# Patient Record
Sex: Female | Born: 1955 | Race: White | Hispanic: No | Marital: Married | Smoking: Current every day smoker
Health system: Southern US, Community
[De-identification: ages and names within clinical notes are randomized; demographics above are authoritative.]

## PROBLEM LIST (undated history)

## (undated) ENCOUNTER — Emergency Department (HOSPITAL_COMMUNITY): Admission: EM | Payer: Medicaid Other | Source: Home / Self Care

## (undated) ENCOUNTER — Ambulatory Visit: Admission: EM | Source: Home / Self Care

## (undated) DIAGNOSIS — J45909 Unspecified asthma, uncomplicated: Secondary | ICD-10-CM

## (undated) DIAGNOSIS — F329 Major depressive disorder, single episode, unspecified: Secondary | ICD-10-CM

## (undated) DIAGNOSIS — E785 Hyperlipidemia, unspecified: Secondary | ICD-10-CM

## (undated) DIAGNOSIS — I82401 Acute embolism and thrombosis of unspecified deep veins of right lower extremity: Secondary | ICD-10-CM

## (undated) DIAGNOSIS — J439 Emphysema, unspecified: Secondary | ICD-10-CM

## (undated) DIAGNOSIS — Z9289 Personal history of other medical treatment: Secondary | ICD-10-CM

## (undated) DIAGNOSIS — F419 Anxiety disorder, unspecified: Secondary | ICD-10-CM

## (undated) DIAGNOSIS — K5790 Diverticulosis of intestine, part unspecified, without perforation or abscess without bleeding: Secondary | ICD-10-CM

## (undated) DIAGNOSIS — E119 Type 2 diabetes mellitus without complications: Secondary | ICD-10-CM

## (undated) DIAGNOSIS — J42 Unspecified chronic bronchitis: Secondary | ICD-10-CM

## (undated) DIAGNOSIS — N189 Chronic kidney disease, unspecified: Secondary | ICD-10-CM

## (undated) DIAGNOSIS — Z9989 Dependence on other enabling machines and devices: Secondary | ICD-10-CM

## (undated) DIAGNOSIS — R42 Dizziness and giddiness: Secondary | ICD-10-CM

## (undated) DIAGNOSIS — F32A Depression, unspecified: Secondary | ICD-10-CM

## (undated) DIAGNOSIS — R0902 Hypoxemia: Secondary | ICD-10-CM

## (undated) DIAGNOSIS — J069 Acute upper respiratory infection, unspecified: Secondary | ICD-10-CM

## (undated) DIAGNOSIS — K219 Gastro-esophageal reflux disease without esophagitis: Secondary | ICD-10-CM

## (undated) DIAGNOSIS — G4730 Sleep apnea, unspecified: Secondary | ICD-10-CM

## (undated) DIAGNOSIS — J189 Pneumonia, unspecified organism: Secondary | ICD-10-CM

## (undated) DIAGNOSIS — Z87442 Personal history of urinary calculi: Secondary | ICD-10-CM

## (undated) DIAGNOSIS — M1990 Unspecified osteoarthritis, unspecified site: Secondary | ICD-10-CM

## (undated) DIAGNOSIS — M542 Cervicalgia: Secondary | ICD-10-CM

## (undated) DIAGNOSIS — K589 Irritable bowel syndrome without diarrhea: Secondary | ICD-10-CM

## (undated) DIAGNOSIS — R0609 Other forms of dyspnea: Secondary | ICD-10-CM

## (undated) DIAGNOSIS — I509 Heart failure, unspecified: Secondary | ICD-10-CM

## (undated) DIAGNOSIS — N390 Urinary tract infection, site not specified: Secondary | ICD-10-CM

## (undated) DIAGNOSIS — G40909 Epilepsy, unspecified, not intractable, without status epilepticus: Secondary | ICD-10-CM

## (undated) DIAGNOSIS — I10 Essential (primary) hypertension: Secondary | ICD-10-CM

## (undated) DIAGNOSIS — J449 Chronic obstructive pulmonary disease, unspecified: Secondary | ICD-10-CM

## (undated) DIAGNOSIS — K222 Esophageal obstruction: Secondary | ICD-10-CM

## (undated) DIAGNOSIS — Z86718 Personal history of other venous thrombosis and embolism: Secondary | ICD-10-CM

## (undated) DIAGNOSIS — G4733 Obstructive sleep apnea (adult) (pediatric): Secondary | ICD-10-CM

## (undated) DIAGNOSIS — U071 COVID-19: Secondary | ICD-10-CM

## (undated) DIAGNOSIS — Z923 Personal history of irradiation: Secondary | ICD-10-CM

## (undated) DIAGNOSIS — C801 Malignant (primary) neoplasm, unspecified: Secondary | ICD-10-CM

## (undated) DIAGNOSIS — R0600 Dyspnea, unspecified: Secondary | ICD-10-CM

## (undated) DIAGNOSIS — R569 Unspecified convulsions: Secondary | ICD-10-CM

## (undated) HISTORY — PX: CARPAL TUNNEL RELEASE: SHX101

## (undated) HISTORY — DX: Personal history of other venous thrombosis and embolism: Z86.718

## (undated) HISTORY — PX: CATARACT EXTRACTION: SUR2

## (undated) HISTORY — DX: Dependence on other enabling machines and devices: Z99.89

## (undated) HISTORY — DX: Heart failure, unspecified: I50.9

## (undated) HISTORY — PX: KNEE ARTHROSCOPY: SUR90

## (undated) HISTORY — DX: Irritable bowel syndrome, unspecified: K58.9

## (undated) HISTORY — DX: Unspecified osteoarthritis, unspecified site: M19.90

## (undated) HISTORY — DX: Sleep apnea, unspecified: G47.30

## (undated) HISTORY — DX: Obstructive sleep apnea (adult) (pediatric): G47.33

## (undated) HISTORY — PX: SHOULDER OPEN ROTATOR CUFF REPAIR: SHX2407

## (undated) HISTORY — DX: Essential (primary) hypertension: I10

## (undated) HISTORY — DX: Diverticulosis of intestine, part unspecified, without perforation or abscess without bleeding: K57.90

## (undated) HISTORY — DX: Dizziness and giddiness: R42

## (undated) HISTORY — PX: COLONOSCOPY WITH ESOPHAGOGASTRODUODENOSCOPY (EGD): SHX5779

## (undated) HISTORY — PX: SINOSCOPY: SHX187

## (undated) HISTORY — PX: UPPER GASTROINTESTINAL ENDOSCOPY: SHX188

## (undated) HISTORY — DX: Personal history of other medical treatment: Z92.89

## (undated) HISTORY — DX: Acute upper respiratory infection, unspecified: J06.9

## (undated) HISTORY — DX: Major depressive disorder, single episode, unspecified: F32.9

## (undated) HISTORY — PX: COLONOSCOPY: SHX174

## (undated) HISTORY — DX: Hyperlipidemia, unspecified: E78.5

## (undated) HISTORY — DX: Hypoxemia: R09.02

## (undated) HISTORY — DX: Unspecified asthma, uncomplicated: J45.909

## (undated) HISTORY — DX: Cervicalgia: M54.2

## (undated) HISTORY — DX: COVID-19: U07.1

## (undated) HISTORY — DX: Depression, unspecified: F32.A

## (undated) HISTORY — DX: Esophageal obstruction: K22.2

## (undated) HISTORY — DX: Unspecified convulsions: R56.9

## (undated) HISTORY — DX: Gastro-esophageal reflux disease without esophagitis: K21.9

## (undated) HISTORY — DX: Chronic obstructive pulmonary disease, unspecified: J44.9

---

## 1980-08-11 DIAGNOSIS — I82401 Acute embolism and thrombosis of unspecified deep veins of right lower extremity: Secondary | ICD-10-CM

## 1980-08-11 HISTORY — DX: Acute embolism and thrombosis of unspecified deep veins of right lower extremity: I82.401

## 1997-12-18 ENCOUNTER — Encounter: Admission: RE | Admit: 1997-12-18 | Discharge: 1997-12-18 | Payer: Self-pay | Admitting: Family Medicine

## 1997-12-22 ENCOUNTER — Encounter: Admission: RE | Admit: 1997-12-22 | Discharge: 1997-12-22 | Payer: Self-pay | Admitting: Family Medicine

## 1998-01-01 ENCOUNTER — Encounter: Admission: RE | Admit: 1998-01-01 | Discharge: 1998-01-01 | Payer: Self-pay | Admitting: Family Medicine

## 1998-01-15 ENCOUNTER — Encounter: Admission: RE | Admit: 1998-01-15 | Discharge: 1998-01-15 | Payer: Self-pay | Admitting: Family Medicine

## 1998-02-01 ENCOUNTER — Encounter: Admission: RE | Admit: 1998-02-01 | Discharge: 1998-02-01 | Payer: Self-pay | Admitting: Family Medicine

## 1998-02-26 ENCOUNTER — Encounter: Admission: RE | Admit: 1998-02-26 | Discharge: 1998-02-26 | Payer: Self-pay | Admitting: Family Medicine

## 1998-03-15 ENCOUNTER — Encounter: Admission: RE | Admit: 1998-03-15 | Discharge: 1998-03-15 | Payer: Self-pay | Admitting: Family Medicine

## 1998-04-19 ENCOUNTER — Encounter: Admission: RE | Admit: 1998-04-19 | Discharge: 1998-04-19 | Payer: Self-pay | Admitting: Family Medicine

## 1998-05-31 ENCOUNTER — Encounter: Admission: RE | Admit: 1998-05-31 | Discharge: 1998-05-31 | Payer: Self-pay | Admitting: Family Medicine

## 1998-06-19 ENCOUNTER — Other Ambulatory Visit: Admission: RE | Admit: 1998-06-19 | Discharge: 1998-06-19 | Payer: Self-pay | Admitting: *Deleted

## 1998-07-24 ENCOUNTER — Encounter: Admission: RE | Admit: 1998-07-24 | Discharge: 1998-07-24 | Payer: Self-pay | Admitting: Sports Medicine

## 1998-08-07 ENCOUNTER — Encounter: Admission: RE | Admit: 1998-08-07 | Discharge: 1998-08-07 | Payer: Self-pay | Admitting: Family Medicine

## 1998-09-10 ENCOUNTER — Encounter: Admission: RE | Admit: 1998-09-10 | Discharge: 1998-09-10 | Payer: Self-pay | Admitting: Sports Medicine

## 1998-09-18 ENCOUNTER — Encounter: Admission: RE | Admit: 1998-09-18 | Discharge: 1998-09-18 | Payer: Self-pay | Admitting: Sports Medicine

## 1998-10-11 ENCOUNTER — Encounter: Admission: RE | Admit: 1998-10-11 | Discharge: 1998-10-11 | Payer: Self-pay | Admitting: Family Medicine

## 1998-11-05 ENCOUNTER — Encounter: Admission: RE | Admit: 1998-11-05 | Discharge: 1998-11-05 | Payer: Self-pay | Admitting: Family Medicine

## 1998-11-14 ENCOUNTER — Encounter: Admission: RE | Admit: 1998-11-14 | Discharge: 1998-11-14 | Payer: Self-pay | Admitting: Family Medicine

## 1998-12-13 ENCOUNTER — Encounter: Admission: RE | Admit: 1998-12-13 | Discharge: 1998-12-13 | Payer: Self-pay | Admitting: Family Medicine

## 1999-01-21 ENCOUNTER — Encounter: Admission: RE | Admit: 1999-01-21 | Discharge: 1999-01-21 | Payer: Self-pay | Admitting: Family Medicine

## 1999-03-08 ENCOUNTER — Encounter: Admission: RE | Admit: 1999-03-08 | Discharge: 1999-03-08 | Payer: Self-pay | Admitting: Family Medicine

## 1999-04-11 ENCOUNTER — Encounter: Admission: RE | Admit: 1999-04-11 | Discharge: 1999-04-11 | Payer: Self-pay | Admitting: Family Medicine

## 1999-04-17 ENCOUNTER — Encounter: Admission: RE | Admit: 1999-04-17 | Discharge: 1999-04-17 | Payer: Self-pay | Admitting: Family Medicine

## 1999-04-24 ENCOUNTER — Encounter: Admission: RE | Admit: 1999-04-24 | Discharge: 1999-04-24 | Payer: Self-pay | Admitting: Family Medicine

## 1999-05-16 ENCOUNTER — Encounter: Admission: RE | Admit: 1999-05-16 | Discharge: 1999-05-16 | Payer: Self-pay | Admitting: Family Medicine

## 1999-05-29 ENCOUNTER — Encounter: Admission: RE | Admit: 1999-05-29 | Discharge: 1999-05-29 | Payer: Self-pay | Admitting: Family Medicine

## 1999-06-27 ENCOUNTER — Encounter: Admission: RE | Admit: 1999-06-27 | Discharge: 1999-06-27 | Payer: Self-pay | Admitting: Sports Medicine

## 1999-06-27 ENCOUNTER — Encounter: Payer: Self-pay | Admitting: Sports Medicine

## 1999-07-03 ENCOUNTER — Encounter: Admission: RE | Admit: 1999-07-03 | Discharge: 1999-07-03 | Payer: Self-pay | Admitting: Family Medicine

## 1999-08-12 HISTORY — PX: ABDOMINAL HYSTERECTOMY: SHX81

## 1999-08-12 HISTORY — PX: TUBAL LIGATION: SHX77

## 1999-08-19 ENCOUNTER — Encounter: Admission: RE | Admit: 1999-08-19 | Discharge: 1999-08-19 | Payer: Self-pay | Admitting: Family Medicine

## 1999-09-19 ENCOUNTER — Encounter: Admission: RE | Admit: 1999-09-19 | Discharge: 1999-09-19 | Payer: Self-pay | Admitting: Family Medicine

## 1999-10-18 ENCOUNTER — Encounter: Admission: RE | Admit: 1999-10-18 | Discharge: 1999-10-18 | Payer: Self-pay | Admitting: Family Medicine

## 1999-10-30 ENCOUNTER — Encounter: Admission: RE | Admit: 1999-10-30 | Discharge: 1999-10-30 | Payer: Self-pay | Admitting: Sports Medicine

## 1999-10-30 ENCOUNTER — Encounter: Payer: Self-pay | Admitting: Sports Medicine

## 1999-11-06 ENCOUNTER — Encounter: Admission: RE | Admit: 1999-11-06 | Discharge: 1999-11-06 | Payer: Self-pay | Admitting: Family Medicine

## 1999-12-10 ENCOUNTER — Encounter: Admission: RE | Admit: 1999-12-10 | Discharge: 1999-12-10 | Payer: Self-pay | Admitting: Family Medicine

## 1999-12-19 ENCOUNTER — Ambulatory Visit (HOSPITAL_COMMUNITY): Admission: RE | Admit: 1999-12-19 | Discharge: 1999-12-19 | Payer: Self-pay | Admitting: *Deleted

## 1999-12-25 ENCOUNTER — Encounter: Admission: RE | Admit: 1999-12-25 | Discharge: 1999-12-25 | Payer: Self-pay | Admitting: Family Medicine

## 2000-04-17 ENCOUNTER — Encounter: Admission: RE | Admit: 2000-04-17 | Discharge: 2000-04-17 | Payer: Self-pay | Admitting: Family Medicine

## 2000-04-22 ENCOUNTER — Encounter: Admission: RE | Admit: 2000-04-22 | Discharge: 2000-04-22 | Payer: Self-pay | Admitting: Family Medicine

## 2000-06-05 ENCOUNTER — Encounter: Admission: RE | Admit: 2000-06-05 | Discharge: 2000-06-05 | Payer: Self-pay | Admitting: Obstetrics & Gynecology

## 2000-06-09 ENCOUNTER — Encounter: Admission: RE | Admit: 2000-06-09 | Discharge: 2000-06-09 | Payer: Self-pay | Admitting: Family Medicine

## 2000-06-12 ENCOUNTER — Encounter: Admission: RE | Admit: 2000-06-12 | Discharge: 2000-06-12 | Payer: Self-pay | Admitting: Obstetrics & Gynecology

## 2000-06-17 ENCOUNTER — Ambulatory Visit (HOSPITAL_COMMUNITY): Admission: RE | Admit: 2000-06-17 | Discharge: 2000-06-17 | Payer: Self-pay | Admitting: *Deleted

## 2000-07-14 ENCOUNTER — Encounter: Admission: RE | Admit: 2000-07-14 | Discharge: 2000-07-14 | Payer: Self-pay | Admitting: Family Medicine

## 2000-07-21 ENCOUNTER — Encounter (INDEPENDENT_AMBULATORY_CARE_PROVIDER_SITE_OTHER): Payer: Self-pay | Admitting: Specialist

## 2000-07-21 ENCOUNTER — Inpatient Hospital Stay (HOSPITAL_COMMUNITY): Admission: RE | Admit: 2000-07-21 | Discharge: 2000-07-23 | Payer: Self-pay | Admitting: Obstetrics & Gynecology

## 2000-07-25 ENCOUNTER — Inpatient Hospital Stay (HOSPITAL_COMMUNITY): Admission: AD | Admit: 2000-07-25 | Discharge: 2000-07-25 | Payer: Self-pay | Admitting: *Deleted

## 2000-07-27 ENCOUNTER — Inpatient Hospital Stay (HOSPITAL_COMMUNITY): Admission: EM | Admit: 2000-07-27 | Discharge: 2000-07-30 | Payer: Self-pay

## 2000-07-28 ENCOUNTER — Encounter: Payer: Self-pay | Admitting: Family Medicine

## 2000-08-24 ENCOUNTER — Encounter: Admission: RE | Admit: 2000-08-24 | Discharge: 2000-08-24 | Payer: Self-pay | Admitting: Family Medicine

## 2000-09-14 ENCOUNTER — Encounter: Admission: RE | Admit: 2000-09-14 | Discharge: 2000-09-14 | Payer: Self-pay | Admitting: Sports Medicine

## 2000-09-14 ENCOUNTER — Encounter: Payer: Self-pay | Admitting: Sports Medicine

## 2000-09-14 ENCOUNTER — Encounter: Admission: RE | Admit: 2000-09-14 | Discharge: 2000-09-14 | Payer: Self-pay | Admitting: Family Medicine

## 2000-09-24 ENCOUNTER — Encounter: Admission: RE | Admit: 2000-09-24 | Discharge: 2000-09-24 | Payer: Self-pay | Admitting: Family Medicine

## 2000-09-29 ENCOUNTER — Encounter: Admission: RE | Admit: 2000-09-29 | Discharge: 2000-09-29 | Payer: Self-pay | Admitting: *Deleted

## 2000-10-17 ENCOUNTER — Emergency Department (HOSPITAL_COMMUNITY): Admission: EM | Admit: 2000-10-17 | Discharge: 2000-10-17 | Payer: Self-pay | Admitting: Emergency Medicine

## 2000-10-23 ENCOUNTER — Encounter: Admission: RE | Admit: 2000-10-23 | Discharge: 2000-10-23 | Payer: Self-pay | Admitting: Obstetrics & Gynecology

## 2000-11-02 ENCOUNTER — Encounter: Admission: RE | Admit: 2000-11-02 | Discharge: 2000-11-02 | Payer: Self-pay | Admitting: Family Medicine

## 2000-11-11 ENCOUNTER — Ambulatory Visit (HOSPITAL_COMMUNITY): Admission: RE | Admit: 2000-11-11 | Discharge: 2000-11-11 | Payer: Self-pay | Admitting: *Deleted

## 2000-11-27 ENCOUNTER — Encounter: Admission: RE | Admit: 2000-11-27 | Discharge: 2000-11-27 | Payer: Self-pay | Admitting: Family Medicine

## 2001-01-01 ENCOUNTER — Ambulatory Visit (HOSPITAL_COMMUNITY): Admission: RE | Admit: 2001-01-01 | Discharge: 2001-01-01 | Payer: Self-pay | Admitting: Family Medicine

## 2001-01-01 ENCOUNTER — Encounter: Admission: RE | Admit: 2001-01-01 | Discharge: 2001-01-01 | Payer: Self-pay | Admitting: Family Medicine

## 2001-02-16 ENCOUNTER — Encounter: Admission: RE | Admit: 2001-02-16 | Discharge: 2001-02-16 | Payer: Self-pay | Admitting: Family Medicine

## 2001-03-05 ENCOUNTER — Encounter: Admission: RE | Admit: 2001-03-05 | Discharge: 2001-03-05 | Payer: Self-pay | Admitting: Family Medicine

## 2001-04-07 ENCOUNTER — Encounter: Admission: RE | Admit: 2001-04-07 | Discharge: 2001-04-07 | Payer: Self-pay | Admitting: Family Medicine

## 2001-04-08 ENCOUNTER — Encounter: Admission: RE | Admit: 2001-04-08 | Discharge: 2001-04-08 | Payer: Self-pay | Admitting: Sports Medicine

## 2001-04-08 ENCOUNTER — Encounter: Payer: Self-pay | Admitting: Sports Medicine

## 2001-04-21 ENCOUNTER — Encounter: Admission: RE | Admit: 2001-04-21 | Discharge: 2001-04-21 | Payer: Self-pay | Admitting: Family Medicine

## 2001-05-17 ENCOUNTER — Encounter: Admission: RE | Admit: 2001-05-17 | Discharge: 2001-05-17 | Payer: Self-pay | Admitting: Family Medicine

## 2001-05-19 ENCOUNTER — Encounter: Admission: RE | Admit: 2001-05-19 | Discharge: 2001-05-19 | Payer: Self-pay | Admitting: Orthopedic Surgery

## 2001-05-19 ENCOUNTER — Encounter: Payer: Self-pay | Admitting: Sports Medicine

## 2001-06-22 ENCOUNTER — Encounter: Admission: RE | Admit: 2001-06-22 | Discharge: 2001-06-22 | Payer: Self-pay | Admitting: Family Medicine

## 2001-08-30 ENCOUNTER — Encounter: Admission: RE | Admit: 2001-08-30 | Discharge: 2001-08-30 | Payer: Self-pay | Admitting: Family Medicine

## 2001-10-22 ENCOUNTER — Encounter: Admission: RE | Admit: 2001-10-22 | Discharge: 2001-10-22 | Payer: Self-pay | Admitting: Family Medicine

## 2001-10-26 ENCOUNTER — Encounter: Admission: RE | Admit: 2001-10-26 | Discharge: 2001-10-26 | Payer: Self-pay | Admitting: *Deleted

## 2001-10-26 ENCOUNTER — Encounter: Payer: Self-pay | Admitting: Sports Medicine

## 2001-12-20 ENCOUNTER — Encounter: Payer: Self-pay | Admitting: Sports Medicine

## 2001-12-20 ENCOUNTER — Encounter: Admission: RE | Admit: 2001-12-20 | Discharge: 2001-12-20 | Payer: Self-pay | Admitting: Pediatrics

## 2001-12-20 ENCOUNTER — Encounter: Admission: RE | Admit: 2001-12-20 | Discharge: 2001-12-20 | Payer: Self-pay | Admitting: Sports Medicine

## 2002-01-07 ENCOUNTER — Encounter: Admission: RE | Admit: 2002-01-07 | Discharge: 2002-01-07 | Payer: Self-pay | Admitting: Family Medicine

## 2002-01-09 ENCOUNTER — Encounter (INDEPENDENT_AMBULATORY_CARE_PROVIDER_SITE_OTHER): Payer: Self-pay | Admitting: *Deleted

## 2002-01-09 LAB — CONVERTED CEMR LAB

## 2002-01-24 ENCOUNTER — Ambulatory Visit (HOSPITAL_COMMUNITY): Admission: RE | Admit: 2002-01-24 | Discharge: 2002-01-24 | Payer: Self-pay | Admitting: Family Medicine

## 2002-01-24 ENCOUNTER — Encounter: Admission: RE | Admit: 2002-01-24 | Discharge: 2002-01-24 | Payer: Self-pay | Admitting: Family Medicine

## 2002-01-25 ENCOUNTER — Encounter: Admission: RE | Admit: 2002-01-25 | Discharge: 2002-01-25 | Payer: Self-pay | Admitting: Family Medicine

## 2002-02-01 ENCOUNTER — Encounter: Admission: RE | Admit: 2002-02-01 | Discharge: 2002-02-01 | Payer: Self-pay | Admitting: Family Medicine

## 2002-02-17 ENCOUNTER — Encounter: Payer: Self-pay | Admitting: Orthopedic Surgery

## 2002-02-17 ENCOUNTER — Encounter: Admission: RE | Admit: 2002-02-17 | Discharge: 2002-02-17 | Payer: Self-pay | Admitting: Orthopedic Surgery

## 2002-02-22 ENCOUNTER — Encounter: Admission: RE | Admit: 2002-02-22 | Discharge: 2002-02-22 | Payer: Self-pay | Admitting: Family Medicine

## 2002-02-27 ENCOUNTER — Ambulatory Visit (HOSPITAL_BASED_OUTPATIENT_CLINIC_OR_DEPARTMENT_OTHER): Admission: RE | Admit: 2002-02-27 | Discharge: 2002-02-27 | Payer: Self-pay | Admitting: *Deleted

## 2002-03-23 ENCOUNTER — Encounter: Admission: RE | Admit: 2002-03-23 | Discharge: 2002-03-23 | Payer: Self-pay | Admitting: Family Medicine

## 2002-04-27 ENCOUNTER — Encounter: Payer: Self-pay | Admitting: Orthopedic Surgery

## 2002-04-28 ENCOUNTER — Ambulatory Visit (HOSPITAL_COMMUNITY): Admission: RE | Admit: 2002-04-28 | Discharge: 2002-04-29 | Payer: Self-pay | Admitting: Orthopedic Surgery

## 2002-05-05 ENCOUNTER — Encounter: Admission: RE | Admit: 2002-05-05 | Discharge: 2002-05-05 | Payer: Self-pay | Admitting: Family Medicine

## 2002-05-17 ENCOUNTER — Encounter: Admission: RE | Admit: 2002-05-17 | Discharge: 2002-06-01 | Payer: Self-pay | Admitting: Orthopedic Surgery

## 2002-05-23 ENCOUNTER — Encounter: Admission: RE | Admit: 2002-05-23 | Discharge: 2002-05-23 | Payer: Self-pay | Admitting: Family Medicine

## 2002-08-15 ENCOUNTER — Encounter: Admission: RE | Admit: 2002-08-15 | Discharge: 2002-08-15 | Payer: Self-pay | Admitting: Family Medicine

## 2002-10-14 ENCOUNTER — Encounter: Admission: RE | Admit: 2002-10-14 | Discharge: 2002-10-14 | Payer: Self-pay | Admitting: *Deleted

## 2002-10-14 ENCOUNTER — Encounter: Payer: Self-pay | Admitting: Sports Medicine

## 2002-10-26 ENCOUNTER — Encounter: Admission: RE | Admit: 2002-10-26 | Discharge: 2002-10-26 | Payer: Self-pay | Admitting: Family Medicine

## 2002-10-28 ENCOUNTER — Encounter: Admission: RE | Admit: 2002-10-28 | Discharge: 2002-10-28 | Payer: Self-pay | Admitting: Family Medicine

## 2002-11-03 ENCOUNTER — Encounter: Admission: RE | Admit: 2002-11-03 | Discharge: 2002-11-03 | Payer: Self-pay | Admitting: Family Medicine

## 2002-12-06 ENCOUNTER — Encounter: Admission: RE | Admit: 2002-12-06 | Discharge: 2002-12-06 | Payer: Self-pay | Admitting: Family Medicine

## 2002-12-14 ENCOUNTER — Ambulatory Visit (HOSPITAL_COMMUNITY): Admission: RE | Admit: 2002-12-14 | Discharge: 2002-12-14 | Payer: Self-pay

## 2002-12-14 ENCOUNTER — Encounter: Admission: RE | Admit: 2002-12-14 | Discharge: 2002-12-14 | Payer: Self-pay | Admitting: Family Medicine

## 2002-12-29 ENCOUNTER — Encounter: Admission: RE | Admit: 2002-12-29 | Discharge: 2002-12-29 | Payer: Self-pay | Admitting: Sports Medicine

## 2003-01-13 ENCOUNTER — Encounter: Admission: RE | Admit: 2003-01-13 | Discharge: 2003-01-13 | Payer: Self-pay | Admitting: Sports Medicine

## 2003-02-02 ENCOUNTER — Encounter: Admission: RE | Admit: 2003-02-02 | Discharge: 2003-02-02 | Payer: Self-pay | Admitting: Family Medicine

## 2003-02-21 ENCOUNTER — Encounter: Admission: RE | Admit: 2003-02-21 | Discharge: 2003-02-21 | Payer: Self-pay | Admitting: Family Medicine

## 2003-03-24 ENCOUNTER — Encounter: Admission: RE | Admit: 2003-03-24 | Discharge: 2003-03-24 | Payer: Self-pay | Admitting: Family Medicine

## 2003-04-11 ENCOUNTER — Ambulatory Visit (HOSPITAL_COMMUNITY): Admission: RE | Admit: 2003-04-11 | Discharge: 2003-04-11 | Payer: Self-pay | Admitting: Internal Medicine

## 2003-04-11 ENCOUNTER — Encounter: Payer: Self-pay | Admitting: Internal Medicine

## 2003-04-13 ENCOUNTER — Ambulatory Visit (HOSPITAL_COMMUNITY): Admission: RE | Admit: 2003-04-13 | Discharge: 2003-04-13 | Payer: Self-pay | Admitting: Internal Medicine

## 2003-04-18 ENCOUNTER — Ambulatory Visit (HOSPITAL_COMMUNITY): Admission: RE | Admit: 2003-04-18 | Discharge: 2003-04-18 | Payer: Self-pay | Admitting: Internal Medicine

## 2003-04-18 ENCOUNTER — Encounter: Payer: Self-pay | Admitting: Internal Medicine

## 2003-06-07 ENCOUNTER — Encounter: Admission: RE | Admit: 2003-06-07 | Discharge: 2003-06-07 | Payer: Self-pay | Admitting: Family Medicine

## 2003-07-10 ENCOUNTER — Encounter: Admission: RE | Admit: 2003-07-10 | Discharge: 2003-07-10 | Payer: Self-pay | Admitting: Family Medicine

## 2003-07-21 ENCOUNTER — Encounter: Admission: RE | Admit: 2003-07-21 | Discharge: 2003-07-21 | Payer: Self-pay | Admitting: Family Medicine

## 2003-08-12 DIAGNOSIS — C801 Malignant (primary) neoplasm, unspecified: Secondary | ICD-10-CM

## 2003-08-12 HISTORY — DX: Malignant (primary) neoplasm, unspecified: C80.1

## 2003-09-15 ENCOUNTER — Encounter: Admission: RE | Admit: 2003-09-15 | Discharge: 2003-09-15 | Payer: Self-pay | Admitting: Family Medicine

## 2003-11-02 ENCOUNTER — Encounter: Admission: RE | Admit: 2003-11-02 | Discharge: 2003-11-02 | Payer: Self-pay | Admitting: Sports Medicine

## 2003-11-22 ENCOUNTER — Encounter: Admission: RE | Admit: 2003-11-22 | Discharge: 2003-11-22 | Payer: Self-pay | Admitting: Family Medicine

## 2003-12-28 ENCOUNTER — Encounter: Admission: RE | Admit: 2003-12-28 | Discharge: 2003-12-28 | Payer: Self-pay | Admitting: Sports Medicine

## 2004-01-23 ENCOUNTER — Encounter: Admission: RE | Admit: 2004-01-23 | Discharge: 2004-01-23 | Payer: Self-pay | Admitting: Family Medicine

## 2004-01-29 ENCOUNTER — Encounter: Admission: RE | Admit: 2004-01-29 | Discharge: 2004-01-29 | Payer: Self-pay | Admitting: Family Medicine

## 2004-02-04 ENCOUNTER — Emergency Department (HOSPITAL_COMMUNITY): Admission: EM | Admit: 2004-02-04 | Discharge: 2004-02-04 | Payer: Self-pay | Admitting: *Deleted

## 2004-02-05 ENCOUNTER — Encounter: Admission: RE | Admit: 2004-02-05 | Discharge: 2004-02-05 | Payer: Self-pay | Admitting: Family Medicine

## 2004-02-09 ENCOUNTER — Encounter: Admission: RE | Admit: 2004-02-09 | Discharge: 2004-02-09 | Payer: Self-pay | Admitting: Family Medicine

## 2004-02-29 ENCOUNTER — Ambulatory Visit (HOSPITAL_BASED_OUTPATIENT_CLINIC_OR_DEPARTMENT_OTHER): Admission: RE | Admit: 2004-02-29 | Discharge: 2004-02-29 | Payer: Self-pay | Admitting: Sports Medicine

## 2004-03-23 ENCOUNTER — Emergency Department (HOSPITAL_COMMUNITY): Admission: EM | Admit: 2004-03-23 | Discharge: 2004-03-24 | Payer: Self-pay

## 2004-04-01 ENCOUNTER — Encounter: Admission: RE | Admit: 2004-04-01 | Discharge: 2004-04-01 | Payer: Self-pay | Admitting: Family Medicine

## 2004-04-29 ENCOUNTER — Ambulatory Visit: Payer: Self-pay | Admitting: Family Medicine

## 2004-05-17 ENCOUNTER — Ambulatory Visit: Payer: Self-pay | Admitting: Sports Medicine

## 2004-05-28 ENCOUNTER — Ambulatory Visit: Payer: Self-pay | Admitting: Sports Medicine

## 2004-06-27 ENCOUNTER — Ambulatory Visit: Payer: Self-pay | Admitting: Family Medicine

## 2004-08-07 ENCOUNTER — Ambulatory Visit: Payer: Self-pay | Admitting: Sports Medicine

## 2004-08-15 ENCOUNTER — Ambulatory Visit: Payer: Self-pay | Admitting: Family Medicine

## 2004-08-15 ENCOUNTER — Inpatient Hospital Stay (HOSPITAL_COMMUNITY): Admission: AD | Admit: 2004-08-15 | Discharge: 2004-08-16 | Payer: Self-pay | Admitting: Sports Medicine

## 2004-10-28 ENCOUNTER — Ambulatory Visit: Payer: Self-pay | Admitting: Family Medicine

## 2004-11-22 ENCOUNTER — Ambulatory Visit: Payer: Self-pay | Admitting: Family Medicine

## 2004-12-18 ENCOUNTER — Ambulatory Visit: Payer: Self-pay | Admitting: Family Medicine

## 2004-12-19 ENCOUNTER — Ambulatory Visit: Payer: Self-pay | Admitting: Family Medicine

## 2004-12-30 ENCOUNTER — Ambulatory Visit (HOSPITAL_COMMUNITY): Admission: RE | Admit: 2004-12-30 | Discharge: 2004-12-30 | Payer: Self-pay | Admitting: Family Medicine

## 2004-12-30 ENCOUNTER — Ambulatory Visit: Payer: Self-pay | Admitting: Family Medicine

## 2004-12-31 ENCOUNTER — Encounter: Admission: RE | Admit: 2004-12-31 | Discharge: 2005-03-31 | Payer: Self-pay | Admitting: Family Medicine

## 2005-01-17 ENCOUNTER — Ambulatory Visit: Payer: Self-pay | Admitting: Family Medicine

## 2005-02-08 ENCOUNTER — Emergency Department (HOSPITAL_COMMUNITY): Admission: EM | Admit: 2005-02-08 | Discharge: 2005-02-08 | Payer: Self-pay | Admitting: Emergency Medicine

## 2005-02-14 ENCOUNTER — Ambulatory Visit: Payer: Self-pay | Admitting: Family Medicine

## 2005-03-10 ENCOUNTER — Ambulatory Visit: Payer: Self-pay | Admitting: Family Medicine

## 2005-03-19 ENCOUNTER — Encounter: Admission: RE | Admit: 2005-03-19 | Discharge: 2005-03-19 | Payer: Self-pay | Admitting: Sports Medicine

## 2005-03-31 ENCOUNTER — Ambulatory Visit: Payer: Self-pay | Admitting: Family Medicine

## 2005-04-05 ENCOUNTER — Emergency Department (HOSPITAL_COMMUNITY): Admission: EM | Admit: 2005-04-05 | Discharge: 2005-04-05 | Payer: Self-pay | Admitting: Emergency Medicine

## 2005-04-07 ENCOUNTER — Ambulatory Visit: Payer: Self-pay | Admitting: Family Medicine

## 2005-04-18 ENCOUNTER — Ambulatory Visit: Payer: Self-pay | Admitting: Family Medicine

## 2005-05-22 ENCOUNTER — Ambulatory Visit: Payer: Self-pay | Admitting: Family Medicine

## 2005-07-21 ENCOUNTER — Ambulatory Visit: Payer: Self-pay | Admitting: Family Medicine

## 2005-08-18 ENCOUNTER — Ambulatory Visit: Payer: Self-pay | Admitting: Family Medicine

## 2005-08-22 ENCOUNTER — Emergency Department (HOSPITAL_COMMUNITY): Admission: EM | Admit: 2005-08-22 | Discharge: 2005-08-22 | Payer: Self-pay | Admitting: Emergency Medicine

## 2005-09-29 ENCOUNTER — Ambulatory Visit: Payer: Self-pay | Admitting: Family Medicine

## 2005-10-17 ENCOUNTER — Ambulatory Visit: Payer: Self-pay | Admitting: Family Medicine

## 2005-10-31 ENCOUNTER — Ambulatory Visit: Payer: Self-pay | Admitting: Family Medicine

## 2005-11-09 ENCOUNTER — Inpatient Hospital Stay (HOSPITAL_COMMUNITY): Admission: AD | Admit: 2005-11-09 | Discharge: 2005-11-09 | Payer: Self-pay | Admitting: Family Medicine

## 2006-02-27 ENCOUNTER — Ambulatory Visit: Payer: Self-pay | Admitting: Family Medicine

## 2006-03-04 ENCOUNTER — Encounter: Admission: RE | Admit: 2006-03-04 | Discharge: 2006-03-04 | Payer: Self-pay | Admitting: Sports Medicine

## 2006-03-26 ENCOUNTER — Ambulatory Visit (HOSPITAL_COMMUNITY): Admission: RE | Admit: 2006-03-26 | Discharge: 2006-03-26 | Payer: Self-pay | Admitting: Family Medicine

## 2006-03-26 ENCOUNTER — Ambulatory Visit: Payer: Self-pay | Admitting: Family Medicine

## 2006-04-03 ENCOUNTER — Ambulatory Visit: Payer: Self-pay | Admitting: Family Medicine

## 2006-04-16 ENCOUNTER — Ambulatory Visit: Payer: Self-pay | Admitting: Family Medicine

## 2006-05-12 ENCOUNTER — Ambulatory Visit: Payer: Self-pay | Admitting: Sports Medicine

## 2006-05-20 ENCOUNTER — Ambulatory Visit: Payer: Self-pay | Admitting: Internal Medicine

## 2006-05-28 ENCOUNTER — Encounter (INDEPENDENT_AMBULATORY_CARE_PROVIDER_SITE_OTHER): Payer: Self-pay | Admitting: Specialist

## 2006-05-28 ENCOUNTER — Ambulatory Visit: Payer: Self-pay | Admitting: Internal Medicine

## 2006-06-16 ENCOUNTER — Ambulatory Visit: Payer: Self-pay | Admitting: Family Medicine

## 2006-08-12 ENCOUNTER — Encounter: Admission: RE | Admit: 2006-08-12 | Discharge: 2006-08-12 | Payer: Self-pay | Admitting: Sports Medicine

## 2006-08-12 ENCOUNTER — Ambulatory Visit: Payer: Self-pay | Admitting: Family Medicine

## 2006-08-20 ENCOUNTER — Emergency Department (HOSPITAL_COMMUNITY): Admission: EM | Admit: 2006-08-20 | Discharge: 2006-08-20 | Payer: Self-pay | Admitting: Emergency Medicine

## 2006-08-26 ENCOUNTER — Ambulatory Visit: Payer: Self-pay | Admitting: Sports Medicine

## 2006-10-05 ENCOUNTER — Ambulatory Visit: Payer: Self-pay | Admitting: Family Medicine

## 2006-10-08 DIAGNOSIS — E782 Mixed hyperlipidemia: Secondary | ICD-10-CM | POA: Insufficient documentation

## 2006-10-08 DIAGNOSIS — E669 Obesity, unspecified: Secondary | ICD-10-CM

## 2006-10-08 DIAGNOSIS — H409 Unspecified glaucoma: Secondary | ICD-10-CM | POA: Insufficient documentation

## 2006-10-08 DIAGNOSIS — E785 Hyperlipidemia, unspecified: Secondary | ICD-10-CM

## 2006-10-08 DIAGNOSIS — N393 Stress incontinence (female) (male): Secondary | ICD-10-CM

## 2006-10-08 DIAGNOSIS — R569 Unspecified convulsions: Secondary | ICD-10-CM | POA: Insufficient documentation

## 2006-10-08 DIAGNOSIS — G43019 Migraine without aura, intractable, without status migrainosus: Secondary | ICD-10-CM | POA: Insufficient documentation

## 2006-10-08 DIAGNOSIS — K219 Gastro-esophageal reflux disease without esophagitis: Secondary | ICD-10-CM | POA: Insufficient documentation

## 2006-10-08 DIAGNOSIS — K449 Diaphragmatic hernia without obstruction or gangrene: Secondary | ICD-10-CM | POA: Insufficient documentation

## 2006-10-08 DIAGNOSIS — M1710 Unilateral primary osteoarthritis, unspecified knee: Secondary | ICD-10-CM

## 2006-10-08 DIAGNOSIS — G4730 Sleep apnea, unspecified: Secondary | ICD-10-CM | POA: Insufficient documentation

## 2006-10-08 DIAGNOSIS — N951 Menopausal and female climacteric states: Secondary | ICD-10-CM

## 2006-10-08 DIAGNOSIS — F17210 Nicotine dependence, cigarettes, uncomplicated: Secondary | ICD-10-CM | POA: Insufficient documentation

## 2006-10-08 DIAGNOSIS — E119 Type 2 diabetes mellitus without complications: Secondary | ICD-10-CM | POA: Insufficient documentation

## 2006-10-09 ENCOUNTER — Encounter (INDEPENDENT_AMBULATORY_CARE_PROVIDER_SITE_OTHER): Payer: Self-pay | Admitting: *Deleted

## 2006-10-20 ENCOUNTER — Encounter: Payer: Self-pay | Admitting: Infectious Diseases

## 2006-10-25 ENCOUNTER — Emergency Department (HOSPITAL_COMMUNITY): Admission: EM | Admit: 2006-10-25 | Discharge: 2006-10-25 | Payer: Self-pay | Admitting: Family Medicine

## 2006-11-10 LAB — CONVERTED CEMR LAB
Pap Smear: NORMAL
Pap Smear: NORMAL
Pap Smear: NORMAL
Pap Smear: NORMAL
Pap Smear: NORMAL
Pap Smear: NORMAL
Pap Smear: NORMAL

## 2006-11-24 ENCOUNTER — Ambulatory Visit: Payer: Self-pay | Admitting: Infectious Diseases

## 2006-11-25 ENCOUNTER — Ambulatory Visit: Payer: Self-pay | Admitting: Family Medicine

## 2006-11-25 ENCOUNTER — Encounter (INDEPENDENT_AMBULATORY_CARE_PROVIDER_SITE_OTHER): Payer: Self-pay | Admitting: Family Medicine

## 2006-11-25 ENCOUNTER — Other Ambulatory Visit: Admission: RE | Admit: 2006-11-25 | Discharge: 2006-11-25 | Payer: Self-pay | Admitting: Family Medicine

## 2006-11-28 ENCOUNTER — Telehealth (INDEPENDENT_AMBULATORY_CARE_PROVIDER_SITE_OTHER): Payer: Self-pay | Admitting: Family Medicine

## 2006-11-28 ENCOUNTER — Encounter (INDEPENDENT_AMBULATORY_CARE_PROVIDER_SITE_OTHER): Payer: Self-pay | Admitting: Family Medicine

## 2006-12-01 ENCOUNTER — Encounter: Payer: Self-pay | Admitting: *Deleted

## 2006-12-08 ENCOUNTER — Encounter: Admission: RE | Admit: 2006-12-08 | Discharge: 2006-12-08 | Payer: Self-pay | Admitting: Sports Medicine

## 2006-12-10 ENCOUNTER — Encounter (INDEPENDENT_AMBULATORY_CARE_PROVIDER_SITE_OTHER): Payer: Self-pay | Admitting: Family Medicine

## 2006-12-10 ENCOUNTER — Ambulatory Visit: Payer: Self-pay | Admitting: Sports Medicine

## 2006-12-18 ENCOUNTER — Encounter: Admission: RE | Admit: 2006-12-18 | Discharge: 2006-12-18 | Payer: Self-pay | Admitting: Sports Medicine

## 2007-01-28 ENCOUNTER — Ambulatory Visit: Payer: Self-pay | Admitting: Sports Medicine

## 2007-02-09 ENCOUNTER — Telehealth (INDEPENDENT_AMBULATORY_CARE_PROVIDER_SITE_OTHER): Payer: Self-pay | Admitting: Family Medicine

## 2007-02-11 ENCOUNTER — Telehealth: Payer: Self-pay | Admitting: *Deleted

## 2007-03-02 ENCOUNTER — Encounter (INDEPENDENT_AMBULATORY_CARE_PROVIDER_SITE_OTHER): Payer: Self-pay | Admitting: Family Medicine

## 2007-03-11 ENCOUNTER — Telehealth: Payer: Self-pay | Admitting: Infectious Diseases

## 2007-03-26 ENCOUNTER — Telehealth: Payer: Self-pay | Admitting: Infectious Diseases

## 2007-04-19 ENCOUNTER — Ambulatory Visit: Payer: Self-pay | Admitting: Family Medicine

## 2007-04-19 ENCOUNTER — Telehealth: Payer: Self-pay | Admitting: *Deleted

## 2007-04-22 ENCOUNTER — Ambulatory Visit: Payer: Self-pay | Admitting: Family Medicine

## 2007-04-22 ENCOUNTER — Encounter (INDEPENDENT_AMBULATORY_CARE_PROVIDER_SITE_OTHER): Payer: Self-pay | Admitting: Family Medicine

## 2007-04-22 ENCOUNTER — Encounter: Admission: RE | Admit: 2007-04-22 | Discharge: 2007-04-22 | Payer: Self-pay | Admitting: Family Medicine

## 2007-04-22 LAB — CONVERTED CEMR LAB
CO2: 22 meq/L (ref 19–32)
Calcium: 9.6 mg/dL (ref 8.4–10.5)
Eosinophils Relative: 0 % (ref 0–5)
Glucose, Bld: 100 mg/dL — ABNORMAL HIGH (ref 70–99)
HCT: 37.2 % (ref 36.0–46.0)
Hemoglobin: 12.3 g/dL (ref 12.0–15.0)
Lymphocytes Relative: 18 % (ref 12–46)
Lymphs Abs: 1.1 10*3/uL (ref 0.7–3.3)
Platelets: 269 10*3/uL (ref 150–400)
RBC: 3.84 M/uL — ABNORMAL LOW (ref 3.87–5.11)
Sodium: 141 meq/L (ref 135–145)
WBC: 6 10*3/uL (ref 4.0–10.5)

## 2007-05-11 ENCOUNTER — Telehealth: Payer: Self-pay | Admitting: Infectious Diseases

## 2007-05-12 ENCOUNTER — Ambulatory Visit: Payer: Self-pay | Admitting: Family Medicine

## 2007-05-12 ENCOUNTER — Encounter (INDEPENDENT_AMBULATORY_CARE_PROVIDER_SITE_OTHER): Payer: Self-pay | Admitting: Family Medicine

## 2007-05-12 DIAGNOSIS — J449 Chronic obstructive pulmonary disease, unspecified: Secondary | ICD-10-CM | POA: Insufficient documentation

## 2007-05-13 LAB — CONVERTED CEMR LAB
HCT: 41 % (ref 36.0–46.0)
MCV: 101.7 fL — ABNORMAL HIGH (ref 78.0–100.0)
Platelets: 199 10*3/uL (ref 150–400)
RDW: 15.1 % — ABNORMAL HIGH (ref 11.5–14.0)

## 2007-05-14 ENCOUNTER — Encounter (INDEPENDENT_AMBULATORY_CARE_PROVIDER_SITE_OTHER): Payer: Self-pay | Admitting: Family Medicine

## 2007-05-26 ENCOUNTER — Ambulatory Visit: Payer: Self-pay | Admitting: Family Medicine

## 2007-05-27 ENCOUNTER — Encounter (INDEPENDENT_AMBULATORY_CARE_PROVIDER_SITE_OTHER): Payer: Self-pay | Admitting: *Deleted

## 2007-05-27 ENCOUNTER — Ambulatory Visit: Payer: Self-pay | Admitting: Family Medicine

## 2007-05-27 LAB — CONVERTED CEMR LAB
Nitrite: NEGATIVE
Urobilinogen, UA: 0.2
WBC Urine, dipstick: NEGATIVE

## 2007-06-04 ENCOUNTER — Encounter (INDEPENDENT_AMBULATORY_CARE_PROVIDER_SITE_OTHER): Payer: Self-pay | Admitting: *Deleted

## 2007-06-30 ENCOUNTER — Encounter (INDEPENDENT_AMBULATORY_CARE_PROVIDER_SITE_OTHER): Payer: Self-pay | Admitting: Family Medicine

## 2007-06-30 ENCOUNTER — Ambulatory Visit: Payer: Self-pay | Admitting: Family Medicine

## 2007-06-30 ENCOUNTER — Encounter (INDEPENDENT_AMBULATORY_CARE_PROVIDER_SITE_OTHER): Payer: Self-pay | Admitting: *Deleted

## 2007-06-30 ENCOUNTER — Telehealth (INDEPENDENT_AMBULATORY_CARE_PROVIDER_SITE_OTHER): Payer: Self-pay | Admitting: *Deleted

## 2007-06-30 LAB — CONVERTED CEMR LAB
AST: 9 units/L (ref 0–37)
Albumin: 3.6 g/dL (ref 3.5–5.2)
Alkaline Phosphatase: 100 units/L (ref 39–117)
Total Protein: 6.2 g/dL (ref 6.0–8.3)

## 2007-08-27 ENCOUNTER — Ambulatory Visit: Payer: Self-pay | Admitting: Family Medicine

## 2007-09-01 ENCOUNTER — Ambulatory Visit: Payer: Self-pay | Admitting: Infectious Diseases

## 2007-09-09 ENCOUNTER — Telehealth: Payer: Self-pay | Admitting: Infectious Diseases

## 2007-09-24 ENCOUNTER — Encounter: Payer: Self-pay | Admitting: *Deleted

## 2007-09-24 ENCOUNTER — Ambulatory Visit: Payer: Self-pay | Admitting: Family Medicine

## 2007-09-24 ENCOUNTER — Encounter: Admission: RE | Admit: 2007-09-24 | Discharge: 2007-09-24 | Payer: Self-pay | Admitting: Family Medicine

## 2007-09-24 DIAGNOSIS — J309 Allergic rhinitis, unspecified: Secondary | ICD-10-CM | POA: Insufficient documentation

## 2007-09-24 DIAGNOSIS — J449 Chronic obstructive pulmonary disease, unspecified: Secondary | ICD-10-CM | POA: Insufficient documentation

## 2007-10-21 ENCOUNTER — Ambulatory Visit: Payer: Self-pay | Admitting: Infectious Diseases

## 2007-10-23 ENCOUNTER — Telehealth: Payer: Self-pay | Admitting: Infectious Diseases

## 2007-11-01 ENCOUNTER — Telehealth: Payer: Self-pay | Admitting: Infectious Diseases

## 2007-11-05 ENCOUNTER — Ambulatory Visit: Payer: Self-pay | Admitting: Family Medicine

## 2007-11-17 ENCOUNTER — Encounter (INDEPENDENT_AMBULATORY_CARE_PROVIDER_SITE_OTHER): Payer: Self-pay | Admitting: Family Medicine

## 2007-11-17 ENCOUNTER — Ambulatory Visit: Payer: Self-pay | Admitting: Family Medicine

## 2007-11-17 DIAGNOSIS — K589 Irritable bowel syndrome without diarrhea: Secondary | ICD-10-CM

## 2007-11-17 LAB — CONVERTED CEMR LAB
Nitrite: NEGATIVE
Specific Gravity, Urine: 1.01
WBC Urine, dipstick: NEGATIVE

## 2007-11-18 LAB — CONVERTED CEMR LAB
ALT: <8 units/L (ref 0–35)
AST: 9 units/L (ref 0–37)
Alkaline Phosphatase: 116 units/L (ref 39–117)
Basophils Absolute: 0 10*3/uL (ref 0.0–0.1)
Basophils Relative: 0 % (ref 0–1)
Calcium: 9.4 mg/dL (ref 8.4–10.5)
Chloride: 108 meq/L (ref 96–112)
Creatinine, Ser: 0.84 mg/dL (ref 0.40–1.20)
Eosinophils Absolute: 0 10*3/uL (ref 0.0–0.7)
HDL: 46 mg/dL (ref >39)
Hemoglobin: 12.4 g/dL (ref 12.0–15.0)
LDL Cholesterol: 99 mg/dL (ref 0–99)
Lipase: 14 units/L (ref 0–75)
MCHC: 32.5 g/dL (ref 30.0–36.0)
MCV: 101.1 fL — ABNORMAL HIGH (ref 78.0–100.0)
Monocytes Absolute: 0.5 10*3/uL (ref 0.1–1.0)
Neutro Abs: 4.6 10*3/uL (ref 1.7–7.7)
Neutrophils Relative %: 63 % (ref 43–77)
RDW: 13.4 % (ref 11.5–15.5)
TSH: 1.846 microintl units/mL (ref 0.350–5.50)
Total CHOL/HDL Ratio: 4
VLDL: 39 mg/dL (ref 0–40)

## 2007-11-19 ENCOUNTER — Encounter: Admission: RE | Admit: 2007-11-19 | Discharge: 2007-11-19 | Payer: Self-pay | Admitting: Family Medicine

## 2007-11-22 ENCOUNTER — Encounter: Admission: RE | Admit: 2007-11-22 | Discharge: 2007-11-22 | Payer: Self-pay | Admitting: Family Medicine

## 2007-11-24 ENCOUNTER — Encounter (INDEPENDENT_AMBULATORY_CARE_PROVIDER_SITE_OTHER): Payer: Self-pay | Admitting: Family Medicine

## 2007-11-24 ENCOUNTER — Ambulatory Visit: Payer: Self-pay | Admitting: Family Medicine

## 2007-11-24 DIAGNOSIS — M899 Disorder of bone, unspecified: Secondary | ICD-10-CM | POA: Insufficient documentation

## 2007-11-24 DIAGNOSIS — M949 Disorder of cartilage, unspecified: Secondary | ICD-10-CM

## 2007-11-25 ENCOUNTER — Ambulatory Visit: Payer: Self-pay | Admitting: Gastroenterology

## 2007-11-28 ENCOUNTER — Encounter (INDEPENDENT_AMBULATORY_CARE_PROVIDER_SITE_OTHER): Payer: Self-pay | Admitting: Family Medicine

## 2007-12-24 ENCOUNTER — Ambulatory Visit: Payer: Self-pay | Admitting: Family Medicine

## 2008-01-01 ENCOUNTER — Encounter: Payer: Self-pay | Admitting: Family Medicine

## 2008-01-01 ENCOUNTER — Observation Stay (HOSPITAL_COMMUNITY): Admission: EM | Admit: 2008-01-01 | Discharge: 2008-01-01 | Payer: Self-pay | Admitting: Emergency Medicine

## 2008-01-01 ENCOUNTER — Ambulatory Visit: Payer: Self-pay | Admitting: Family Medicine

## 2008-01-21 ENCOUNTER — Encounter (INDEPENDENT_AMBULATORY_CARE_PROVIDER_SITE_OTHER): Payer: Self-pay | Admitting: Family Medicine

## 2008-01-21 ENCOUNTER — Ambulatory Visit: Payer: Self-pay | Admitting: Family Medicine

## 2008-01-21 DIAGNOSIS — E559 Vitamin D deficiency, unspecified: Secondary | ICD-10-CM

## 2008-01-21 LAB — CONVERTED CEMR LAB
CRP, High Sensitivity: 2.6 — ABNORMAL HIGH
Vitamin B-12: 414 pg/mL (ref 211–911)

## 2008-01-26 ENCOUNTER — Encounter (INDEPENDENT_AMBULATORY_CARE_PROVIDER_SITE_OTHER): Payer: Self-pay | Admitting: Family Medicine

## 2008-01-26 ENCOUNTER — Ambulatory Visit: Payer: Self-pay | Admitting: Family Medicine

## 2008-01-26 LAB — CONVERTED CEMR LAB
Cholesterol: 203 mg/dL — ABNORMAL HIGH (ref 0–200)
Triglycerides: 215 mg/dL — ABNORMAL HIGH (ref <150)
VLDL: 43 mg/dL — ABNORMAL HIGH (ref 0–40)

## 2008-01-31 ENCOUNTER — Encounter: Admission: RE | Admit: 2008-01-31 | Discharge: 2008-01-31 | Payer: Self-pay | Admitting: Neurology

## 2008-02-01 ENCOUNTER — Encounter: Admission: RE | Admit: 2008-02-01 | Discharge: 2008-02-01 | Payer: Self-pay | Admitting: Family Medicine

## 2008-02-25 ENCOUNTER — Ambulatory Visit: Payer: Self-pay | Admitting: Family Medicine

## 2008-02-25 LAB — CONVERTED CEMR LAB

## 2008-03-01 ENCOUNTER — Encounter (INDEPENDENT_AMBULATORY_CARE_PROVIDER_SITE_OTHER): Payer: Self-pay | Admitting: Family Medicine

## 2008-03-02 ENCOUNTER — Telehealth: Payer: Self-pay | Admitting: *Deleted

## 2008-03-22 ENCOUNTER — Encounter: Admission: RE | Admit: 2008-03-22 | Discharge: 2008-05-04 | Payer: Self-pay | Admitting: Family Medicine

## 2008-03-29 ENCOUNTER — Ambulatory Visit: Payer: Self-pay | Admitting: Family Medicine

## 2008-03-29 ENCOUNTER — Encounter (INDEPENDENT_AMBULATORY_CARE_PROVIDER_SITE_OTHER): Payer: Self-pay | Admitting: Family Medicine

## 2008-03-31 ENCOUNTER — Encounter (INDEPENDENT_AMBULATORY_CARE_PROVIDER_SITE_OTHER): Payer: Self-pay | Admitting: Family Medicine

## 2008-03-31 LAB — CONVERTED CEMR LAB
Direct LDL: 56 mg/dL
Vit D, 1,25-Dihydroxy: 35 (ref 30–89)

## 2008-04-18 ENCOUNTER — Telehealth (INDEPENDENT_AMBULATORY_CARE_PROVIDER_SITE_OTHER): Payer: Self-pay | Admitting: Family Medicine

## 2008-04-28 ENCOUNTER — Encounter (INDEPENDENT_AMBULATORY_CARE_PROVIDER_SITE_OTHER): Payer: Self-pay | Admitting: Family Medicine

## 2008-05-01 ENCOUNTER — Ambulatory Visit: Payer: Self-pay | Admitting: Family Medicine

## 2008-05-24 ENCOUNTER — Ambulatory Visit: Payer: Self-pay | Admitting: Family Medicine

## 2008-06-15 ENCOUNTER — Ambulatory Visit: Payer: Self-pay | Admitting: Family Medicine

## 2008-06-15 LAB — CONVERTED CEMR LAB: Hgb A1c MFr Bld: 5.3 %

## 2008-06-20 ENCOUNTER — Encounter (INDEPENDENT_AMBULATORY_CARE_PROVIDER_SITE_OTHER): Payer: Self-pay | Admitting: Family Medicine

## 2008-07-24 ENCOUNTER — Ambulatory Visit: Payer: Self-pay | Admitting: Family Medicine

## 2008-08-02 ENCOUNTER — Emergency Department (HOSPITAL_COMMUNITY): Admission: EM | Admit: 2008-08-02 | Discharge: 2008-08-02 | Payer: Self-pay | Admitting: Family Medicine

## 2008-08-08 ENCOUNTER — Ambulatory Visit: Payer: Self-pay | Admitting: Family Medicine

## 2008-08-08 ENCOUNTER — Encounter: Admission: RE | Admit: 2008-08-08 | Discharge: 2008-08-08 | Payer: Self-pay | Admitting: Family Medicine

## 2008-08-08 DIAGNOSIS — R519 Headache, unspecified: Secondary | ICD-10-CM | POA: Insufficient documentation

## 2008-08-08 DIAGNOSIS — R51 Headache: Secondary | ICD-10-CM | POA: Insufficient documentation

## 2008-08-17 ENCOUNTER — Ambulatory Visit: Payer: Self-pay | Admitting: Family Medicine

## 2008-08-17 DIAGNOSIS — J329 Chronic sinusitis, unspecified: Secondary | ICD-10-CM | POA: Insufficient documentation

## 2008-08-22 ENCOUNTER — Telehealth (INDEPENDENT_AMBULATORY_CARE_PROVIDER_SITE_OTHER): Payer: Self-pay | Admitting: *Deleted

## 2008-08-28 ENCOUNTER — Telehealth (INDEPENDENT_AMBULATORY_CARE_PROVIDER_SITE_OTHER): Payer: Self-pay | Admitting: *Deleted

## 2008-08-30 ENCOUNTER — Telehealth (INDEPENDENT_AMBULATORY_CARE_PROVIDER_SITE_OTHER): Payer: Self-pay | Admitting: *Deleted

## 2008-10-05 ENCOUNTER — Ambulatory Visit: Payer: Self-pay | Admitting: Family Medicine

## 2008-10-05 ENCOUNTER — Telehealth (INDEPENDENT_AMBULATORY_CARE_PROVIDER_SITE_OTHER): Payer: Self-pay | Admitting: Family Medicine

## 2008-10-11 ENCOUNTER — Encounter (INDEPENDENT_AMBULATORY_CARE_PROVIDER_SITE_OTHER): Payer: Self-pay | Admitting: Family Medicine

## 2008-10-31 ENCOUNTER — Ambulatory Visit: Payer: Self-pay | Admitting: Family Medicine

## 2008-10-31 ENCOUNTER — Encounter (INDEPENDENT_AMBULATORY_CARE_PROVIDER_SITE_OTHER): Payer: Self-pay | Admitting: Family Medicine

## 2008-10-31 LAB — CONVERTED CEMR LAB: Vit D, 25-Hydroxy: 43 ng/mL (ref 30–89)

## 2008-11-01 ENCOUNTER — Encounter (INDEPENDENT_AMBULATORY_CARE_PROVIDER_SITE_OTHER): Payer: Self-pay | Admitting: Family Medicine

## 2008-11-04 ENCOUNTER — Encounter: Admission: RE | Admit: 2008-11-04 | Discharge: 2008-11-04 | Payer: Self-pay | Admitting: Orthopedic Surgery

## 2008-11-13 ENCOUNTER — Ambulatory Visit: Payer: Self-pay | Admitting: Family Medicine

## 2008-11-16 ENCOUNTER — Emergency Department (HOSPITAL_COMMUNITY): Admission: EM | Admit: 2008-11-16 | Discharge: 2008-11-16 | Payer: Self-pay | Admitting: Emergency Medicine

## 2008-11-17 ENCOUNTER — Telehealth (INDEPENDENT_AMBULATORY_CARE_PROVIDER_SITE_OTHER): Payer: Self-pay | Admitting: Family Medicine

## 2008-11-20 ENCOUNTER — Telehealth (INDEPENDENT_AMBULATORY_CARE_PROVIDER_SITE_OTHER): Payer: Self-pay | Admitting: Family Medicine

## 2008-11-20 ENCOUNTER — Telehealth: Payer: Self-pay | Admitting: *Deleted

## 2008-11-20 ENCOUNTER — Encounter: Admission: RE | Admit: 2008-11-20 | Discharge: 2008-11-20 | Payer: Self-pay | Admitting: Family Medicine

## 2008-11-20 ENCOUNTER — Ambulatory Visit: Payer: Self-pay | Admitting: Family Medicine

## 2008-11-29 ENCOUNTER — Ambulatory Visit: Payer: Self-pay | Admitting: Family Medicine

## 2008-11-29 ENCOUNTER — Ambulatory Visit (HOSPITAL_COMMUNITY): Admission: RE | Admit: 2008-11-29 | Discharge: 2008-11-29 | Payer: Self-pay | Admitting: Family Medicine

## 2008-11-29 ENCOUNTER — Encounter: Admission: RE | Admit: 2008-11-29 | Discharge: 2008-11-29 | Payer: Self-pay | Admitting: Family Medicine

## 2008-11-29 ENCOUNTER — Encounter (INDEPENDENT_AMBULATORY_CARE_PROVIDER_SITE_OTHER): Payer: Self-pay | Admitting: Family Medicine

## 2008-11-29 ENCOUNTER — Ambulatory Visit: Payer: Self-pay | Admitting: Vascular Surgery

## 2008-11-30 ENCOUNTER — Encounter: Payer: Self-pay | Admitting: *Deleted

## 2008-12-01 ENCOUNTER — Telehealth (INDEPENDENT_AMBULATORY_CARE_PROVIDER_SITE_OTHER): Payer: Self-pay | Admitting: Family Medicine

## 2008-12-05 ENCOUNTER — Ambulatory Visit: Payer: Self-pay | Admitting: Family Medicine

## 2008-12-05 DIAGNOSIS — F4321 Adjustment disorder with depressed mood: Secondary | ICD-10-CM | POA: Insufficient documentation

## 2008-12-22 ENCOUNTER — Telehealth (INDEPENDENT_AMBULATORY_CARE_PROVIDER_SITE_OTHER): Payer: Self-pay | Admitting: Family Medicine

## 2008-12-27 ENCOUNTER — Ambulatory Visit: Payer: Self-pay | Admitting: Family Medicine

## 2008-12-27 LAB — CONVERTED CEMR LAB: Hgb A1c MFr Bld: 5.8 %

## 2009-01-16 ENCOUNTER — Encounter (INDEPENDENT_AMBULATORY_CARE_PROVIDER_SITE_OTHER): Payer: Self-pay | Admitting: Family Medicine

## 2009-01-19 ENCOUNTER — Encounter: Payer: Self-pay | Admitting: Family Medicine

## 2009-01-25 ENCOUNTER — Encounter (INDEPENDENT_AMBULATORY_CARE_PROVIDER_SITE_OTHER): Payer: Self-pay | Admitting: Family Medicine

## 2009-01-31 ENCOUNTER — Ambulatory Visit: Payer: Self-pay | Admitting: Family Medicine

## 2009-01-31 DIAGNOSIS — M67919 Unspecified disorder of synovium and tendon, unspecified shoulder: Secondary | ICD-10-CM | POA: Insufficient documentation

## 2009-01-31 DIAGNOSIS — H5340 Unspecified visual field defects: Secondary | ICD-10-CM | POA: Insufficient documentation

## 2009-01-31 DIAGNOSIS — M719 Bursopathy, unspecified: Secondary | ICD-10-CM

## 2009-02-08 ENCOUNTER — Ambulatory Visit (HOSPITAL_BASED_OUTPATIENT_CLINIC_OR_DEPARTMENT_OTHER): Admission: RE | Admit: 2009-02-08 | Discharge: 2009-02-08 | Payer: Self-pay | Admitting: Family Medicine

## 2009-02-14 ENCOUNTER — Encounter: Payer: Self-pay | Admitting: Family Medicine

## 2009-02-14 ENCOUNTER — Ambulatory Visit: Payer: Self-pay | Admitting: Pulmonary Disease

## 2009-02-19 ENCOUNTER — Ambulatory Visit: Payer: Self-pay | Admitting: Family Medicine

## 2009-02-19 DIAGNOSIS — G4733 Obstructive sleep apnea (adult) (pediatric): Secondary | ICD-10-CM | POA: Insufficient documentation

## 2009-02-21 ENCOUNTER — Telehealth: Payer: Self-pay | Admitting: *Deleted

## 2009-02-21 ENCOUNTER — Encounter: Payer: Self-pay | Admitting: Family Medicine

## 2009-02-22 ENCOUNTER — Encounter: Payer: Self-pay | Admitting: Family Medicine

## 2009-02-22 ENCOUNTER — Telehealth: Payer: Self-pay | Admitting: *Deleted

## 2009-03-01 ENCOUNTER — Emergency Department (HOSPITAL_COMMUNITY): Admission: EM | Admit: 2009-03-01 | Discharge: 2009-03-02 | Payer: Self-pay | Admitting: Emergency Medicine

## 2009-03-01 ENCOUNTER — Ambulatory Visit: Payer: Self-pay | Admitting: Family Medicine

## 2009-03-02 ENCOUNTER — Telehealth: Payer: Self-pay | Admitting: Family Medicine

## 2009-03-13 ENCOUNTER — Ambulatory Visit: Payer: Self-pay | Admitting: Family Medicine

## 2009-03-13 DIAGNOSIS — M542 Cervicalgia: Secondary | ICD-10-CM | POA: Insufficient documentation

## 2009-03-14 ENCOUNTER — Ambulatory Visit (HOSPITAL_COMMUNITY): Admission: RE | Admit: 2009-03-14 | Discharge: 2009-03-14 | Payer: Self-pay | Admitting: Family Medicine

## 2009-03-15 ENCOUNTER — Telehealth (INDEPENDENT_AMBULATORY_CARE_PROVIDER_SITE_OTHER): Payer: Self-pay | Admitting: *Deleted

## 2009-03-19 ENCOUNTER — Encounter: Payer: Self-pay | Admitting: Family Medicine

## 2009-03-21 ENCOUNTER — Ambulatory Visit: Payer: Self-pay | Admitting: Internal Medicine

## 2009-04-04 ENCOUNTER — Ambulatory Visit (HOSPITAL_BASED_OUTPATIENT_CLINIC_OR_DEPARTMENT_OTHER): Admission: RE | Admit: 2009-04-04 | Discharge: 2009-04-05 | Payer: Self-pay | Admitting: Orthopedic Surgery

## 2009-04-05 ENCOUNTER — Encounter: Admission: RE | Admit: 2009-04-05 | Discharge: 2009-07-04 | Payer: Self-pay | Admitting: Orthopedic Surgery

## 2009-04-25 ENCOUNTER — Encounter: Payer: Self-pay | Admitting: Family Medicine

## 2009-05-01 ENCOUNTER — Encounter: Payer: Self-pay | Admitting: Family Medicine

## 2009-05-22 ENCOUNTER — Ambulatory Visit: Payer: Self-pay | Admitting: Family Medicine

## 2009-06-01 ENCOUNTER — Telehealth: Payer: Self-pay | Admitting: Family Medicine

## 2009-06-04 ENCOUNTER — Encounter: Payer: Self-pay | Admitting: Family Medicine

## 2009-07-17 ENCOUNTER — Ambulatory Visit: Payer: Self-pay | Admitting: Family Medicine

## 2009-07-17 DIAGNOSIS — J019 Acute sinusitis, unspecified: Secondary | ICD-10-CM | POA: Insufficient documentation

## 2009-07-23 ENCOUNTER — Encounter: Payer: Self-pay | Admitting: Family Medicine

## 2009-08-21 ENCOUNTER — Telehealth: Payer: Self-pay | Admitting: Family Medicine

## 2009-08-27 ENCOUNTER — Ambulatory Visit: Payer: Self-pay | Admitting: Family Medicine

## 2009-08-27 ENCOUNTER — Encounter: Payer: Self-pay | Admitting: Family Medicine

## 2009-08-27 DIAGNOSIS — R5383 Other fatigue: Secondary | ICD-10-CM

## 2009-08-27 DIAGNOSIS — L299 Pruritus, unspecified: Secondary | ICD-10-CM | POA: Insufficient documentation

## 2009-08-27 DIAGNOSIS — R5381 Other malaise: Secondary | ICD-10-CM | POA: Insufficient documentation

## 2009-08-27 LAB — CONVERTED CEMR LAB: Ammonia: 29 umol/L (ref 11–35)

## 2009-08-30 LAB — CONVERTED CEMR LAB
BUN: 21 mg/dL (ref 6–23)
CO2: 20 meq/L (ref 19–32)
Calcium: 8.8 mg/dL (ref 8.4–10.5)
Chloride: 110 meq/L (ref 96–112)
Creatinine, Ser: 0.82 mg/dL (ref 0.40–1.20)
HCT: 35.4 % — ABNORMAL LOW (ref 36.0–46.0)
Hemoglobin: 11.7 g/dL — ABNORMAL LOW (ref 12.0–15.0)
MCV: 94.1 fL (ref 78.0–100.0)
Platelets: 227 10*3/uL (ref 150–400)
RDW: 14.1 % (ref 11.5–15.5)
TSH: 2.393 microintl units/mL (ref 0.350–4.500)
Total Bilirubin: 0.2 mg/dL — ABNORMAL LOW (ref 0.3–1.2)
WBC: 7.4 10*3/uL (ref 4.0–10.5)

## 2009-09-28 ENCOUNTER — Telehealth: Payer: Self-pay | Admitting: Family Medicine

## 2009-10-06 ENCOUNTER — Encounter: Admission: RE | Admit: 2009-10-06 | Discharge: 2009-10-06 | Payer: Self-pay | Admitting: Orthopedic Surgery

## 2009-10-09 ENCOUNTER — Encounter: Admission: RE | Admit: 2009-10-09 | Discharge: 2009-10-09 | Payer: Self-pay | Admitting: Orthopedic Surgery

## 2009-10-10 ENCOUNTER — Ambulatory Visit: Payer: Self-pay | Admitting: Family Medicine

## 2009-10-17 ENCOUNTER — Telehealth: Payer: Self-pay | Admitting: Psychology

## 2009-10-17 ENCOUNTER — Ambulatory Visit: Payer: Self-pay | Admitting: Family Medicine

## 2009-10-24 ENCOUNTER — Encounter: Admission: RE | Admit: 2009-10-24 | Discharge: 2009-10-24 | Payer: Self-pay | Admitting: Orthopedic Surgery

## 2009-10-27 ENCOUNTER — Encounter: Payer: Self-pay | Admitting: Family Medicine

## 2009-10-31 ENCOUNTER — Ambulatory Visit: Payer: Self-pay | Admitting: Psychology

## 2009-10-31 DIAGNOSIS — F410 Panic disorder [episodic paroxysmal anxiety] without agoraphobia: Secondary | ICD-10-CM

## 2009-11-21 ENCOUNTER — Encounter: Admission: RE | Admit: 2009-11-21 | Discharge: 2009-11-21 | Payer: Self-pay | Admitting: Family Medicine

## 2009-11-22 ENCOUNTER — Encounter: Payer: Self-pay | Admitting: Family Medicine

## 2009-11-23 ENCOUNTER — Telehealth: Payer: Self-pay | Admitting: Family Medicine

## 2009-11-27 ENCOUNTER — Telehealth: Payer: Self-pay | Admitting: Psychology

## 2009-12-07 ENCOUNTER — Ambulatory Visit: Payer: Self-pay | Admitting: Family Medicine

## 2009-12-07 DIAGNOSIS — R21 Rash and other nonspecific skin eruption: Secondary | ICD-10-CM | POA: Insufficient documentation

## 2009-12-07 DIAGNOSIS — M25569 Pain in unspecified knee: Secondary | ICD-10-CM | POA: Insufficient documentation

## 2009-12-11 ENCOUNTER — Encounter: Payer: Self-pay | Admitting: Family Medicine

## 2009-12-24 ENCOUNTER — Telehealth: Payer: Self-pay | Admitting: Family Medicine

## 2010-01-15 ENCOUNTER — Telehealth: Payer: Self-pay | Admitting: Family Medicine

## 2010-01-31 ENCOUNTER — Telehealth: Payer: Self-pay | Admitting: *Deleted

## 2010-02-13 ENCOUNTER — Encounter: Payer: Self-pay | Admitting: Family Medicine

## 2010-02-13 ENCOUNTER — Ambulatory Visit: Payer: Self-pay | Admitting: Family Medicine

## 2010-02-13 DIAGNOSIS — H5710 Ocular pain, unspecified eye: Secondary | ICD-10-CM | POA: Insufficient documentation

## 2010-02-13 DIAGNOSIS — F411 Generalized anxiety disorder: Secondary | ICD-10-CM | POA: Insufficient documentation

## 2010-02-13 LAB — CONVERTED CEMR LAB
Cholesterol: 202 mg/dL — ABNORMAL HIGH (ref 0–200)
Hgb A1c MFr Bld: 5.4 %
Triglycerides: 104 mg/dL (ref <150)

## 2010-02-18 ENCOUNTER — Encounter: Payer: Self-pay | Admitting: Family Medicine

## 2010-02-24 ENCOUNTER — Emergency Department (HOSPITAL_COMMUNITY): Admission: EM | Admit: 2010-02-24 | Discharge: 2010-02-24 | Payer: Self-pay | Admitting: Emergency Medicine

## 2010-02-27 ENCOUNTER — Telehealth: Payer: Self-pay | Admitting: *Deleted

## 2010-03-27 ENCOUNTER — Encounter: Payer: Self-pay | Admitting: Family Medicine

## 2010-03-27 ENCOUNTER — Ambulatory Visit: Payer: Self-pay | Admitting: Family Medicine

## 2010-03-28 ENCOUNTER — Telehealth: Payer: Self-pay | Admitting: *Deleted

## 2010-03-28 LAB — CONVERTED CEMR LAB
AST: 10 units/L (ref 0–37)
Albumin: 4.2 g/dL (ref 3.5–5.2)
Alkaline Phosphatase: 107 units/L (ref 39–117)
BUN: 25 mg/dL — ABNORMAL HIGH (ref 6–23)
Creatinine, Ser: 0.86 mg/dL (ref 0.40–1.20)
Glucose, Bld: 83 mg/dL (ref 70–99)
HCT: 38.9 % (ref 36.0–46.0)
Hemoglobin: 12.5 g/dL (ref 12.0–15.0)
MCHC: 32.1 g/dL (ref 30.0–36.0)
MCV: 96 fL (ref 78.0–100.0)
RBC: 4.05 M/uL (ref 3.87–5.11)
RDW: 14.1 % (ref 11.5–15.5)
Total Bilirubin: 0.3 mg/dL (ref 0.3–1.2)
Vit D, 25-Hydroxy: 25 ng/mL — ABNORMAL LOW (ref 30–89)

## 2010-03-30 ENCOUNTER — Encounter: Admission: RE | Admit: 2010-03-30 | Discharge: 2010-03-30 | Payer: Self-pay | Admitting: Family Medicine

## 2010-05-20 ENCOUNTER — Telehealth: Payer: Self-pay | Admitting: Family Medicine

## 2010-05-23 ENCOUNTER — Ambulatory Visit: Payer: Self-pay | Admitting: Family Medicine

## 2010-06-04 ENCOUNTER — Encounter: Payer: Self-pay | Admitting: Family Medicine

## 2010-06-04 ENCOUNTER — Ambulatory Visit: Payer: Self-pay | Admitting: Family Medicine

## 2010-06-04 DIAGNOSIS — Z780 Asymptomatic menopausal state: Secondary | ICD-10-CM | POA: Insufficient documentation

## 2010-06-06 ENCOUNTER — Telehealth: Payer: Self-pay | Admitting: *Deleted

## 2010-06-07 ENCOUNTER — Encounter: Admission: RE | Admit: 2010-06-07 | Discharge: 2010-06-07 | Payer: Self-pay | Admitting: Family Medicine

## 2010-06-11 ENCOUNTER — Telehealth: Payer: Self-pay | Admitting: *Deleted

## 2010-06-13 ENCOUNTER — Encounter: Payer: Self-pay | Admitting: Family Medicine

## 2010-06-14 ENCOUNTER — Emergency Department (HOSPITAL_COMMUNITY): Admission: EM | Admit: 2010-06-14 | Discharge: 2010-06-14 | Payer: Self-pay | Admitting: Emergency Medicine

## 2010-06-17 ENCOUNTER — Telehealth: Payer: Self-pay | Admitting: Family Medicine

## 2010-06-19 ENCOUNTER — Ambulatory Visit: Payer: Self-pay | Admitting: Family Medicine

## 2010-06-21 ENCOUNTER — Telehealth: Payer: Self-pay | Admitting: Family Medicine

## 2010-06-22 ENCOUNTER — Encounter: Payer: Self-pay | Admitting: Family Medicine

## 2010-07-10 ENCOUNTER — Encounter: Admission: RE | Admit: 2010-07-10 | Payer: Self-pay | Admitting: Neurology

## 2010-09-01 ENCOUNTER — Encounter: Payer: Self-pay | Admitting: Orthopedic Surgery

## 2010-09-01 ENCOUNTER — Encounter: Payer: Self-pay | Admitting: Family Medicine

## 2010-09-02 ENCOUNTER — Encounter: Payer: Self-pay | Admitting: Family Medicine

## 2010-09-09 ENCOUNTER — Telehealth: Payer: Self-pay | Admitting: Family Medicine

## 2010-09-10 NOTE — Assessment & Plan Note (Signed)
Summary: Rescheduled- No charge   Pt rescheduled do to family emergency, no payment needed [facility] MD  November 22, 2009 5:12 PM   Allergies: 1)  ! Sulfa 2)  ! Biaxin 3)  ! Penicillin   Complete Medication List: 1)  Albuterol 90 Mcg/act Aers (Albuterol) .... Inhale 2 puffs every 4 hours if needed for short of breath 2)  Lamictal 150 Mg Tabs (Lamotrigine) .Marland Kitchen.. 1 tablet by mouth bid 3)  Miralax Powd (Polyethylene glycol 3350) .Marland Kitchen.. 17g by mouth 1-3 times daily as needed constipation disp largest bottle 4)  Prilosec 40 Mg Cpdr (Omeprazole) .... Take one 30-60 min before first and last meals of the day 5)  Spiriva Handihaler 18 Mcg Caps (Tiotropium bromide monohydrate) .... Inhale 1  capsule daily, disp 1 handihaler 6)  Topamax 100 Mg Tabs (Topiramate) .Marland Kitchen.. 1 tablet two times a day for headache prevention 7)  Lactulose 10 Gm/38ml Soln (Lactulose) .... Take 30-28ml as needed for soft bowel movements.  disp 8)  Dicyclomine Hcl 20 Mg Tabs (Dicyclomine hcl) .Marland Kitchen.. 1 by mouth one hour before meals and at bedtime 9)  Vitamin D3 2000 Unit Caps (Cholecalciferol) .Marland Kitchen.. 1 by mouth daily 10)  Flonase 50 Mcg/act Susp (Fluticasone propionate) .... 2 sprays each nostril daily disp: one inhaler 11)  Simvastatin 40 Mg Tabs (Simvastatin) .Marland Kitchen.. 1 by mouth daily 12)  Bacitracin 500 Unit/gm Oint (Bacitracin) .... Apply to affected aerea on belly button two times a day x 7 days 13)  Vicodin 5-500 Mg Tabs (Hydrocodone-acetaminophen) .Marland Kitchen.. 1 by mouth for migraine, every 6 hours as needed 14)  Prednisone 20 Mg Tabs (Prednisone) .... Take 40mg  by mouth daily x 5 days  qs 15)  Avelox 400 Mg Tabs (Moxifloxacin hcl) .Marland Kitchen.. 1 by mouth daily x 5 days 16)  Hydroxyzine Hcl 25 Mg Tabs (Hydroxyzine hcl) .Marland Kitchen.. 1 by mouth q 6hrs as needed itching, do not drink alcohol with this 17)  Nortriptyline Hcl 10 Mg Caps (Nortriptyline hcl) .... Take 3 tablets at bedtime- prescribed by dr. Andrey Campanile neuro 18)  Simvastatin 40 Mg Tabs  (Simvastatin) .Marland Kitchen.. 1 by mouth daily for cholesterol

## 2010-09-10 NOTE — Progress Notes (Signed)
Summary: Rx Req   Phone Note Call from Patient Call back at Home Phone [phone]   Caller: Patient Summary of Call: Pt would like an rx for Vicodin for her headache. Initial call taken by: Clydell Hakim,  September 28, 2009 3:11 PM  Follow-up for Phone Call        will forward to MD. Follow-up by: Theresia Lo RN,  September 28, 2009 3:33 PM    Prescriptions: VICODIN 5-500 MG TABS (HYDROCODONE-ACETAMINOPHEN) 1 by mouth for migraine, every 6 hours as needed  #40 x 0   Entered and Authorized by:   Milinda Antis MD   Signed by:   Milinda Antis MD on 09/28/2009   Method used:   Telephoned to ...       [facility] [address] [phone]* (retail)       [address]       [place], [place]  [address]       Ph: [phone] or [phone]       Fax: [phone]   RxID:   [id number]

## 2010-09-10 NOTE — Letter (Signed)
Summary: Generic Letter  [facility]  [address]   [place], [place] [address]   Phone: [phone]  Fax: [phone]    12/11/2009  [name] [address] [place], [place]  [address]  Dear Dr. Anne [name];   [name] is a shared patient of ours. While she is primary under your care for history of seizure disorder she is also being seen for migraines. Her records show she has been on multiple medications for her migraines which have failed to control her symptoms. She has also [facility] [place] and plans to to see a Specialist in [place].   I would like your input regarding her medications below. After discussion with our behavioral therapist who is also treating Depression in [name], we found that Lamictal can cause daily headaches Questions:  1. Do you have any record of treatment with Depakote in the past?  2. Do you have an alternative to Lamictal and would you consider switching?  3. Per Patient Nortriptyline prescribed for Depression, this has not helped, would you consider discontinuing this medication?  I am weary to change her medications with so many specialist involved.  Current Medications: 1)  ALBUTEROL 90 MCG/ACT AERS (ALBUTEROL) inhale 2 puffs every 4 hours if needed for short of breath 2)  LAMICTAL 150 MG  TABS (LAMOTRIGINE) 1 tablet by mouth bid 3)  MIRALAX   POWD (POLYETHYLENE GLYCOL 3350) 17g by mouth 1-3 times daily as needed constipation disp largest bottle 4)  PRILOSEC 40 MG CPDR (OMEPRAZOLE) Take one 30-60 min before first and last meals of the day 5)  SPIRIVA HANDIHALER 18 MCG  CAPS (TIOTROPIUM BROMIDE MONOHYDRATE) inhale 1  capsule daily, disp 1 handihaler 6)  TOPAMAX 100 MG  TABS (TOPIRAMATE) 1 tablet two times a day for headache prevention 7)  LACTULOSE 10 GM/15ML  SOLN (LACTULOSE) take 30-49mL as needed for soft bowel movements.  disp 8)  DICYCLOMINE HCL 20 MG  TABS (DICYCLOMINE HCL) 1  by mouth one hour before meals and at bedtime 9)  VITAMIN D3 2000 UNIT CAPS (CHOLECALCIFEROL) 1 by mouth daily 10)  FLONASE 50 MCG/ACT SUSP (FLUTICASONE PROPIONATE) 2 sprays each nostril daily disp: one inhaler 11)  VICODIN 5-500 MG TABS (HYDROCODONE-ACETAMINOPHEN) 1 by mouth for migraine, every 6 hours as needed 12)  NORTRIPTYLINE HCL 10 MG CAPS (NORTRIPTYLINE HCL) take 3 tablets at bedtime- prescribed by Dr. Andrey [name] neuro 13)  SIMVASTATIN 40 MG TABS (SIMVASTATIN) 1 by mouth daily for cholesterol 14)  SARNA 0.5-0.5 % LOTN (CAMPHOR-MENTHOL) apply to skin twice a day as needed itching 15)  VISTARIL 50 MG CAPS (HYDROXYZINE PAMOATE) Take 1.5 tab every 6 hours as needed for itching   Sincerely,   Veronica Antis MD  Appended Document: Generic Letter FAXED TO [phone]

## 2010-09-10 NOTE — Assessment & Plan Note (Signed)
Summary: Initial Behavioral Medicine   Primary Care Provider:  Milinda Antis MD   History of Present Illness: [name] presented for an initial psychological assessment.  A client information sheet detailing the Behavioral Medicine Service was provided.  Medical record was reviewed.  Presenting problem: [name] reports her "nerves are bad."  She is also grieving the death of her mother.  March was the month her mom was diagnosed with lung cancer.  She died in 2009-01-06.  She had surgery on her lung and seemed to be doing fine.  Per [name]'s report, they got her up to walk and she had a major stroke.  She was in a coma for a week and then died.  [name] is having a very difficult time.  In addition to the grief, she reports three panic attacks.  The first was a couple of years ago.  The second was a few months ago and th elast one was a week ago.  All three involved some type of emotional component (so not completely untriggered but she did feel like they came out of nowhere).  She was able to be calmed down and manage her breathing by breathing in a paper bag.  She did think she was going to die on the first one especially and still is concerned about this.  She reports she thinks about death a lot and wonders "who is going to go next."    Relevant Medical history: Her problem list is very long.  She reports epilepsy that limits her ability to do things (drive, work etc...).  See list for more details - the epilepsy seemed the biggest issue for he medically.  Of note, she has smoked about 1 ppd since age 63.  Both of her parents had lung cancer.  She thinks about this a lot.  Per her reports, she used to weigh 325 pounds and is now about 160.  She said when she was dx with DM she "stopped eating."  She currently eats one meal a day aroudn 4:00 or 5:00.  She drinks one Coke per day and the rest is water.    Psychiatric / psychological history: She denies every having received therapy or treatment for  mental health reasons.  Her neurologist recently put her on 30 mg of nortriptyline for headaches and depression.  She said it does not help with either.  She was previously on trazodone for a very long time (she says for headaches). She reports she gained a significant amount of weight.    Family history: [name] has been married for nearly 30 years to [name].  They have two sons, [name] (28) and [name].  [name] has been diagnosed with Muscular Dystrophy and Bipolar Disorder.  He is not married and lives with the mother of his three year old son [name]) in [name]'s house.  He is on disability as is [name]'s husband (for CAD).  [name] just moved out of the house with his wife, [name].  They do not have any children.    [name]'s parents were married and she had five siblings.  One brother died at birth and the other died in 85 at a construction site.  She has two sisters who both live in [place] and a brother that lives here.  There is a strong family history of Panic attacks and Bipolar Disorder runs on her side of the family as well (uncle I think)  History of abuse:   Did not assess.  Education / occupation: Graduated high  school.  Worked at [name] for three years when she was 54.  Has not worked out of the home since.    Substance use: Tobacco since age 20; 1 ppd.  No alcohol.  Denies use of other drugs.  Allergies: 1)  ! Sulfa 2)  ! Biaxin 3)  ! Penicillin   Impression & Recommendations:  Problem # 1:  PANIC ATTACK (ICD-300.01)  [name] is neatly groomed and appropriately dressed.  She smells like smoke.  She maintains good eye contact and is cooperative and attentive.  Speech is normal in tone, rate and rhythm.  Mood is reported as anxious and depressed.  Thought process is logical and goal directed but her history if vague with frequent, "I can't explain it" and no attempts to try to.  Denied suicidal or homicidal ideation.  Does not appear to be responding to any internal stimuli.   Cognitively, patient is allert and oriented.  Judgment and insight are average.  Symptoms are consistent with panic attacks.  Would need anticipatory anxiety and evidence of them being untriggered to be categorized as Panic Disorder.  Not sure we are there yet.  Would be nice to manage this before it becomes worse.  Education could be helpful as well as development of coping strategies.  Her COPD may complicate the course of this as SOB and anxiety are often connected.  Of note - she does not believe she has COPD.  She says she was told by the pulmonoligist that she has the "beginnings of COPD" but does not yet have it.  Reviewed note from [name] that indicates mild / moderate COPD.  Will readdress this with patient.   Her updated medication list for this problem includes:    Hydroxyzine Hcl 25 Mg Tabs (Hydroxyzine hcl) .Marland Kitchen... 1 by mouth q 6hrs as needed itching, do not drink alcohol with this    Nortriptyline Hcl 10 Mg Caps (Nortriptyline hcl) .Marland Kitchen... Take 3 tablets at bedtime- prescribed by dr. Andrey [name] neuro  Orders: Diagnostic Interview- [name] [phone])  Problem # 2:  GRIEF REACTION, ACUTE (ICD-309.0)  Definitely moving into complicated grief reaction as she is becoming anxious about death in general - being left by her husband or dying before him.  Also thinking of it related to siblings.  Close knit family.  Mom was her best friend.  Differential included major depression (keeping in mind that Bipolar runs in her family).  She is able to name an entire list of things she enjoys doing.  Affect is not particularly flat.  Mood seems related to grief.  Will continue to assess.  PHQ-9 would be useful.  Did not get today.  Will get next time.  Follow up April 7th at 4:00.  [name][facility]  Complete Medication List: 1)  Albuterol 90 Mcg/act Aers (Albuterol) .... Inhale 2 puffs every 4 hours if needed for short of breath 2)  Lamictal 150 Mg Tabs (Lamotrigine) .Marland Kitchen.. 1 tablet by  mouth bid 3)  Miralax Powd (Polyethylene glycol 3350) .Marland Kitchen.. 17g by mouth 1-3 times daily as needed constipation disp largest bottle 4)  Prilosec 40 Mg Cpdr (Omeprazole) .... Take one 30-60 min before first and last meals of the day 5)  Spiriva Handihaler 18 Mcg Caps (Tiotropium bromide monohydrate) .... Inhale 1  capsule daily, disp 1 handihaler 6)  Topamax 100 Mg Tabs (Topiramate) .Marland Kitchen.. 1 tablet two times a day for headache prevention 7)  Lactulose 10 Gm/57ml Soln (Lactulose) .... Take 30-69ml as needed  for soft bowel movements.  disp 8)  Dicyclomine Hcl 20 Mg Tabs (Dicyclomine hcl) .Marland Kitchen.. 1 by mouth one hour before meals and at bedtime 9)  Vitamin D3 2000 Unit Caps (Cholecalciferol) .Marland Kitchen.. 1 by mouth daily 10)  Flonase 50 Mcg/act Susp (Fluticasone propionate) .... 2 sprays each nostril daily disp: one inhaler 11)  Simvastatin 40 Mg Tabs (Simvastatin) .Marland Kitchen.. 1 by mouth daily 12)  Bacitracin 500 Unit/gm Oint (Bacitracin) .... Apply to affected aerea on belly button two times a day x 7 days 13)  Vicodin 5-500 Mg Tabs (Hydrocodone-acetaminophen) .Marland Kitchen.. 1 by mouth for migraine, every 6 hours as needed 14)  Prednisone 20 Mg Tabs (Prednisone) .... Take 40mg  by mouth daily x 5 days  qs 15)  Avelox 400 Mg Tabs (Moxifloxacin hcl) .Marland Kitchen.. 1 by mouth daily x 5 days 16)  Hydroxyzine Hcl 25 Mg Tabs (Hydroxyzine hcl) .Marland Kitchen.. 1 by mouth q 6hrs as needed itching, do not drink alcohol with this 17)  Nortriptyline Hcl 10 Mg Caps (Nortriptyline hcl) .... Take 3 tablets at bedtime- prescribed by dr. Andrey [name] neuro 18)  Simvastatin 40 Mg Tabs (Simvastatin) .Marland Kitchen.. 1 by mouth daily for cholesterol

## 2010-09-10 NOTE — Assessment & Plan Note (Signed)
Summary: headaches & sinus problem   Vital Signs:  Patient profile:   55 year old female Height:      60.25 inches Weight:      192 pounds BMI:     37.32 O2 Sat:      96 % on Room air Temp:     97.8 degrees F oral Pulse rate:   89 / minute BP sitting:   105 / 70  (right arm) Cuff size:   regular  Vitals Entered By: Tessie Fass CMA (June 04, 2010 2:24 PM)  O2 Flow:  Room air CC: cough and headache Is Patient Diabetic? Yes Pain Assessment Patient in pain? yes     Location: head Intensity: 10   Primary Care Provider:  Milinda Antis MD  CC:  cough and headache.  History of Present Illness:    Headache currently feels like sinuses, draining green mucous from both sides, low grade temperature last night, breathing has been fair, given breathing treatment last night, coughing up green mucous, still smoking 1/2ppd Took Ibuprofen, taking Tussin/OTC cough medicine, using inhaleres  Recieved flu shot, some family members with URI feeling blaoted with decreased appetite    Falling still-  continues to fall at least a few times a month, does not feel dizzy or have a headache prior, does not think her meds are causing it. MRI negative, no recent change to medications. Removed throw rugs from home, feels like her legs just give out     Habits & Providers  Alcohol-Tobacco-Diet     Tobacco Status: current     Tobacco Counseling: to quit use of tobacco products     Cigarette Packs/Day: 1.0  Current Medications (verified): 1)  Albuterol 90 Mcg/act Aers (Albuterol) .... Inhale 2 Puffs Every 4 Hours If Needed For Short of Breath 2)  Lamictal 150 Mg  Tabs (Lamotrigine) .Marland Kitchen.. 1 Tablet By Mouth Bid 3)  Miralax   Powd (Polyethylene Glycol 3350) .Marland Kitchen.. 17g By Mouth 1-3 Times Daily As Needed Constipation Disp Largest Bottle 4)  Prilosec 40 Mg Cpdr (Omeprazole) .... Take One 30-60 Min Before First and Last Meals of The Day 5)  Spiriva Handihaler 18 Mcg  Caps (Tiotropium Bromide  Monohydrate) .... Inhale 1  Capsule Daily, Disp 1 Handihaler 6)  Topamax 100 Mg  Tabs (Topiramate) .Marland Kitchen.. 1 Tablet Two Times A Day For Headache Prevention 7)  Lactulose 10 Gm/17ml  Soln (Lactulose) .... Take 30-4ml As Needed For Soft Bowel Movements.  Disp 8)  Dicyclomine Hcl 20 Mg  Tabs (Dicyclomine Hcl) .Marland Kitchen.. 1 By Mouth One Hour Before Meals and At Bedtime 9)  Vitamin D3 2000 Unit Caps (Cholecalciferol) .Marland Kitchen.. 1 By Mouth Daily 10)  Flonase 50 Mcg/act Susp (Fluticasone Propionate) .... 2 Sprays Each Nostril Daily Disp: One Inhaler 11)  Vicodin 5-500 Mg Tabs (Hydrocodone-Acetaminophen) .Marland Kitchen.. 1 By Mouth For Migraine, Every 6 Hours As Needed 12)  Simvastatin 40 Mg Tabs (Simvastatin) .Marland Kitchen.. 1 By Mouth Daily For Cholesterol 13)  Claritin 10 Mg Caps (Loratadine) .Marland Kitchen.. 1 By Mouth Daily As Needed Allergies 14)  Clobetasol Propionate 0.05 % Crea (Clobetasol Propionate) .... Apply To Rash On Hands Twice A Day X 10 Days 15)  Nortriptyline Hcl 10 Mg Caps (Nortriptyline Hcl) .... Take 3 Tabs By Mouth At Bedtime Per Neurologist 16)  Doxycycline Hyclate 100 Mg Caps (Doxycycline Hyclate) .Marland Kitchen.. 1 By Mouth Two Times A Day X 10 Days 17)  Prednisone 20 Mg Tabs (Prednisone) .... Take 2 Tabs By Mouth Daily X 5  Days  Allergies (verified): 1)  ! Sulfa 2)  ! Biaxin 3)  ! Penicillin 4)  ! Advair Diskus (Fluticasone-Salmeterol)  Physical Exam  General:  NAD, Vital signs noted smells of smoke   Eyes:  no conjunctival inflammation  Ears:  R ear normal and L ear normal.  TM clear bilat Nose:  swollen turbinates Bilat, thick yellow discharge from  left nares, pale mucousa Mouth:  MMM,  Neck:  supple shotty LAD Lungs:  scattred bilat. expiratory wheeze, slight increase WOB, course BS congestion bilat, oxygen sat 96% RA equal air movement- decreased at bases Heart:  RRR, no murmur Msk:  normal HIP ROM without pain bilat Normal ROM-knees without pain Neurologic:  alert & oriented X3,  strength normal in all  extremities.   motor equal bilat Gait non ataxic speech wnl   Impression & Recommendations:  Problem # 1:  SINUSITIS, ACUTE (ICD-461.9) Assessment New  Treat antibotics, noted sinusitis on MRI in Aug, flonase Her updated medication list for this problem includes:    Flonase 50 Mcg/act Susp (Fluticasone propionate) .Marland Kitchen... 2 sprays each nostril daily disp: one inhaler    Doxycycline Hyclate 100 Mg Caps (Doxycycline hyclate) .Marland Kitchen... 1 by mouth two times a day x 10 days  Orders: [facility]- Est  Level 4 (81191)  Problem # 2:  COPD (ICD-496) Assessment: Deteriorated  Concern for start of COPD exacerbaton with sinus infection. antibiotics to cover both, short course of steroids stable for outpatient treatmnet Her updated medication list for this problem includes:    Albuterol 90 Mcg/act Aers (Albuterol) ..... Inhale 2 puffs every 4 hours if needed for short of breath    Spiriva Handihaler 18 Mcg Caps (Tiotropium bromide monohydrate) ..... 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 Consider gait analysis  Orders: [facility]- Est  Level 4 ([address]214)  Complete Medication List: 1)  Albuterol 90 Mcg/act Aers (Albuterol) .... Inhale 2 puffs every 4 hours if needed for short of breath 2)  Lamictal 150 Mg Tabs (Lamotrigine) .Marland Kitchen.. 1 tablet by mouth bid 3)  Miralax Powd (Polyethylene glycol 3350) .Marland Kitchen.. 17g by mouth 1-3 times daily as needed constipation disp largest bottle 4)  Prilosec 40 Mg Cpdr (Omeprazole) .... Take one 30-60 min before first and last meals of the day 5)  Spiriva Handihaler 18 Mcg Caps (Tiotropium bromide monohydrate) .... Inhale 1  capsule daily, disp 1 handihaler 6)  Topamax 100 Mg Tabs (Topiramate) .Marland Kitchen.. 1 tablet two times a day for headache prevention 7)  Lactulose 10 Gm/69ml Soln  (Lactulose) .... Take 30-4ml as needed for soft bowel movements.  disp 8)  Dicyclomine Hcl 20 Mg Tabs (Dicyclomine hcl) .Marland Kitchen.. 1 by mouth one hour before meals and at bedtime 9)  Vitamin D3 2000 Unit Caps (Cholecalciferol) .Marland Kitchen.. 1 by mouth daily 10)  Flonase 50 Mcg/act Susp (Fluticasone propionate) .... 2 sprays each nostril daily disp: one inhaler 11)  Vicodin 5-500 Mg Tabs (Hydrocodone-acetaminophen) .Marland Kitchen.. 1 by mouth for migraine, every 6 hours as needed 12)  Simvastatin 40 Mg Tabs (Simvastatin) .Marland Kitchen.. 1 by mouth daily for cholesterol 13)  Claritin 10 Mg Caps (Loratadine) .Marland Kitchen.. 1 by mouth daily as needed allergies 14)  Clobetasol Propionate 0.05 % Crea (Clobetasol propionate) .... Apply to rash on hands twice a day x 10 days  15)  Nortriptyline Hcl 10 Mg Caps (Nortriptyline hcl) .... Take 3 tabs by mouth at bedtime per neurologist 16)  Doxycycline Hyclate 100 Mg Caps (Doxycycline hyclate) .Marland Kitchen.. 1 by mouth two times a day x 10 days 17)  Prednisone 20 Mg Tabs (Prednisone) .... Take 2 tabs by mouth daily x 5 days  Other Orders: Ketorolac-Toradol 15mg  (W1191) Promethazine up to 50mg  (Y7829)  Patient Instructions: 1)  For your breathing use your inhaler every 4 hours or the next 24 hours then as needed 2)  Continue your flonse 3)  Start the antibiotics 4)  Please follow-up with Dr. Anne Hahn regarding your falls 5)  I will send a referral for the dermatologist 6)  Follow-up after you see Dr. Anne Hahn for your falls  Prescriptions: PREDNISONE 20 MG TABS (PREDNISONE) Take 2 tabs by mouth daily x 5 days  #10 x 0   Entered and Authorized by:   Milinda Antis MD   Signed by:   Milinda Antis MD on 06/04/2010   Method used:   [facility]        [facility] [address] [phone]* (retail)       [address]       [place], [place]  [address]       Ph: [phone] or [phone]       Fax: [phone]   RxID:   [id number] DOXYCYCLINE HYCLATE 100 MG CAPS (DOXYCYCLINE  HYCLATE) 1 by mouth two times a day x 10 days  #20 x 0   Entered and Authorized by:   Milinda Antis MD   Signed by:   Milinda Antis MD on 06/04/2010   Method used:   Electronically to        [facility] [address] [phone]* (retail)       [address]       [place], [place]  [address]       Ph: [phone] or [phone]       Fax: [phone]   RxID:   [id number]    Medication Administration  Injection # 1:    Medication: Ketorolac-Toradol 15mg     Diagnosis: HEADACHE (ICD-784.0)    Route: IM    Site: RUOQ gluteus    Exp Date: 12/10/2011    Lot #: [id number]    Mfr: Hospira    Comments: Patient recieved 30mg  of Toradol    Patient tolerated injection without complications    Given by: Garen Grams LPN (June 04, 2010 3:35 PM)  Injection # 2:    Medication: Promethazine up to 50mg     Diagnosis: HEADACHE (ICD-784.0)    Route: IM    Site: LUOQ gluteus    Exp Date: 01/10/2012    Lot #: [id number]    Mfr: Novaplus    Comments: Patient recieved 25mg  of phenergan    Patient tolerated injection without complications    Given by: Garen Grams LPN (June 04, 2010 3:35 PM)  Orders Added: 1)  Ketorolac-Toradol 15mg  [J1885] 2)  Promethazine up to 50mg  [J2550] 3)  [facility]- Est  Level 4 [[address]]     Prevention & Chronic Care Immunizations   Influenza vaccine: Fluvax Non-MCR  (05/23/2010)   Influenza vaccine due: 05/01/2009    Tetanus booster: 12/09/2001: Done.   Tetanus booster due: 12/10/2011    Pneumococcal vaccine: Not documented  Colorectal Screening   Hemoccult: Not documented   Hemoccult due: Not Indicated  Colonoscopy: Done.  (05/11/2006)   Colonoscopy due: 05/11/2016  Other Screening   Pap smear: normal  (11/10/2006)   Pap smear due: 11/10/2007    Mammogram: ASSESSMENT: Negative - BI-RADS 1^MM DIGITAL SCREENING  (11/21/2009)   Mammogram due: 02/01/2009   Smoking status: current  (06/04/2010)   Smoking cessation counseling: yes   (08/17/2008)  Diabetes Mellitus   HgbA1C: 5.4  (02/13/2010)   Hemoglobin A1C due: 05/27/2008    Eye exam: Not documented    Foot exam: Not documented   High risk foot: Not documented   Foot care education: Not documented    Urine microalbumin/creatinine ratio: Not documented  Lipids   Total Cholesterol: 202  (02/13/2010)   LDL: 126  (02/13/2010)   LDL Direct: 56  (03/29/2008)   HDL: 55  (02/13/2010)   Triglycerides: 104  (02/13/2010)    SGOT (AST): 10  (03/27/2010)   SGPT (ALT): <8 U/L  (03/27/2010)   Alkaline phosphatase: 107  (03/27/2010)   Total bilirubin: 0.3  (03/27/2010)  Self-Management Support :    Diabetes self-management support: Not documented    Lipid self-management support: Not documented    Medication Administration  Injection # 1:    Medication: Ketorolac-Toradol 15mg     Diagnosis: HEADACHE (ICD-784.0)    Route: IM    Site: RUOQ gluteus    Exp Date: 12/10/2011    Lot #: [id number]    Mfr: Hospira    Comments: Patient recieved 30mg  of Toradol    Patient tolerated injection without complications    Given by: Garen Grams LPN (June 04, 2010 3:35 PM)  Injection # 2:    Medication: Promethazine up to 50mg     Diagnosis: HEADACHE (ICD-784.0)    Route: IM    Site: LUOQ gluteus    Exp Date: 01/10/2012    Lot #: [id number]    Mfr: Novaplus    Comments: Patient recieved 25mg  of phenergan    Patient tolerated injection without complications    Given by: Garen Grams LPN (June 04, 2010 3:35 PM)  Orders Added: 1)  Ketorolac-Toradol 15mg  [J1885] 2)  Promethazine up to 50mg  [J2550] 3)  [facility]- Est  Level 4 [11914]  Appended Document: headaches & sinus problem  Treated for migraine for HA associated with sinusitis   Clinical Lists Changes  Problems: Assessed MIGRAINE, UNSPEC., W/O INTRACTABLE MIGRAINE as deteriorated - given abortive treatment Her updated medication list for this problem includes:    Vicodin 5-500 Mg Tabs  (Hydrocodone-acetaminophen) .Marland Kitchen... 1 by mouth for migraine, every 6 hours as needed        History of Present Illness: PCP:  Milinda Antis MD Referred by:  Dr. Thurston Hole   Impression & Recommendations:  Problem # 3:  MIGRAINE, UNSPEC., W/O INTRACTABLE MIGRAINE (ICD-346.90) Assessment: Deteriorated given abortive treatment Her updated medication list for this problem includes:    Vicodin 5-500 Mg Tabs (Hydrocodone-acetaminophen) .Marland Kitchen... 1 by mouth for migraine, every 6 hours as needed  Complete Medication List: 1)  Albuterol 90 Mcg/act Aers (Albuterol) .... Inhale 2 puffs every 4 hours if needed for short of breath 2)  Lamictal 150 Mg Tabs (Lamotrigine) .Marland Kitchen.. 1 tablet by mouth bid 3)  Miralax Powd (Polyethylene glycol 3350) .Marland Kitchen.. 17g by mouth 1-3 times daily as needed constipation disp largest bottle 4)  Prilosec 40 Mg Cpdr (Omeprazole) .... Take one 30-60 min before first and last meals of the day 5)  Spiriva Handihaler 18 Mcg Caps (Tiotropium bromide monohydrate) .... Inhale 1  capsule daily, disp 1 handihaler  6)  Topamax 100 Mg Tabs (Topiramate) .Marland Kitchen.. 1 tablet two times a day for headache prevention 7)  Lactulose 10 Gm/4ml Soln (Lactulose) .... Take 30-68ml as needed for soft bowel movements.  disp 8)  Dicyclomine Hcl 20 Mg Tabs (Dicyclomine hcl) .Marland Kitchen.. 1 by mouth one hour before meals and at bedtime 9)  Vitamin D3 2000 Unit Caps (Cholecalciferol) .Marland Kitchen.. 1 by mouth daily 10)  Flonase 50 Mcg/act Susp (Fluticasone propionate) .... 2 sprays each nostril daily disp: one inhaler 11)  Vicodin 5-500 Mg Tabs (Hydrocodone-acetaminophen) .Marland Kitchen.. 1 by mouth for migraine, every 6 hours as needed 12)  Simvastatin 40 Mg Tabs (Simvastatin) .Marland Kitchen.. 1 by mouth daily for cholesterol 13)  Claritin 10 Mg Caps (Loratadine) .Marland Kitchen.. 1 by mouth daily as needed allergies 14)  Clobetasol Propionate 0.05 % Crea (Clobetasol propionate) .... Apply to rash on hands twice a day x 10 days 15)  Nortriptyline Hcl 10 Mg Caps  (Nortriptyline hcl) .... Take 3 tabs by mouth at bedtime per neurologist 16)  Doxycycline Hyclate 100 Mg Caps (Doxycycline hyclate) .Marland Kitchen.. 1 by mouth two times a day x 10 days 17)  Prednisone 20 Mg Tabs (Prednisone) .... Take 2 tabs by mouth daily x 5 days

## 2010-09-10 NOTE — Miscellaneous (Signed)
Summary: Cert. of Medical Nec  Form dropped off to be filled out for medical equipment.  Please mail when completed. Bradly Bienenstock  June 13, 2010 4:32 PM  Certificate of Medical Necessity for Diabetic Supplies placed in Dr. Deirdre Peer box for completion. Terese Door  June 14, 2010 9:01 AM   Form completed Milinda Antis MD  June 17, 2010 10:19 AM

## 2010-09-10 NOTE — Progress Notes (Signed)
Summary: triage   Phone Note Other Incoming Call back at [phone]   Caller: [facility] Summary of Call: Returning Sally's call. Initial call taken by: Clydell Hakim,  March 28, 2010 10:53 AM  Follow-up for Phone Call        gave herBUN & CR results Follow-up by: Golden Circle RN,  March 28, 2010 10:56 AM     Appended Document: triage Gave pt her TSH and Vit D levels, needs replacement especially with fatigue and falling No other concerns at this time

## 2010-09-10 NOTE — Progress Notes (Signed)
Summary: Schedule Beh Med   Phone Note Call from Patient   Caller: Patient Call For: Spero Geralds, Psy.D. Summary of Call: Patient called to schedule beh-med after a no-show last visit.  We scheduled for May 16th at 11:00. Initial call taken by: Spero Geralds PsyD,  November 27, 2009 2:42 PM

## 2010-09-10 NOTE — Letter (Signed)
Summary: Lipid Letter  [facility]  [address]   [place], [place] [address]   Phone: [phone]  Fax: [phone]    02/13/2010  [name] [address] [place], [place]  [address]  Dear [name]:  We have carefully reviewed your last lipid profile from 02/13/2010 and the results are noted below with a summary of recommendations for lipid management.    Cholesterol:       202     Goal: <200   HDL "good" Cholesterol:   55     Goal: >40   LDL "bad" Cholesterol:   126     Goal: <100   Triglycerides:       104     Goal: <150    Your lipid goals have not been met; we recommend improving on your TLC diet and Adjunctive Measures (see below) and make the following changes to your regimen:    TLC Diet (Therapeutic Lifestyle Change): Saturated Fats & Transfatty acids should be kept < 7% of total calories ***Reduce Saturated Fats Total Fat should be no greater than 25-35% of total calories Carbohydrates should be 50-60% of total calories Protein should be approximately 15% of total calories Fiber should be at least 20-30 grams a day ***Increased fiber may help lower LDL Total Cholesterol should be < 200mg /day Consider adding plant stanol/sterols to diet (example: Benacol spread)     Adjunctive Measures (may lower LIPIDS and reduce risk of Heart Attack) include: Aerobic Exercise (20-30 minutes 3-4 times a week) Limit Alcohol Consumption Weight Reduction Aspirin 75-81 mg a day by mouth (if not allergic or contraindicated) Dietary Fiber 20-30 grams a day by mouth   Your Triglycerides have decreased , but your BAD cholesterol "LDL" has increased. I want to recheck your levels in 6 months, if not improved, your medication will need to be changed.  If you have any questions, please call. We appreciate being able to work with you.   Sincerely,    [facility] Milinda Antis MD  Appended Document: Lipid Letter mailed.

## 2010-09-10 NOTE — Consult Note (Signed)
Summary: [facility]   Imported By: De Nurse 06/28/2010 16:40:29  _____________________________________________________________________  External Attachment:    Type:   Image     Comment:   External Document

## 2010-09-10 NOTE — Progress Notes (Signed)
Summary: Rx Req   Phone Note Refill Request Call back at Home Phone [phone] Message from:  Patient  Refills Requested: Medication #1:  VICODIN 5-500 MG TABS 1 by mouth for migraine Initial call taken by: De Nurse,  May 20, 2010 2:15 PM    Prescriptions: VICODIN 5-500 MG TABS (HYDROCODONE-ACETAMINOPHEN) 1 by mouth for migraine, every 6 hours as needed  #30 x 0   Entered and Authorized by:   Milinda Antis MD   Signed by:   Milinda Antis MD on 05/23/2010   Method used:   Telephoned to ...       [facility] [address] [phone]* (retail)       [address]       [place], [place]  [address]       Ph: [phone] or [phone]       Fax: [phone]   RxID:   [id number]

## 2010-09-10 NOTE — Progress Notes (Signed)
Summary: Rx Req   Phone Note Refill Request Call back at Home Phone [phone] Message from:  Patient  Refills Requested: Medication #1:  VICODIN 5-500 MG TABS 1 by mouth for migraine [facility].  Initial call taken by: Clydell Hakim,  Dec 24, 2009 3:27 PM Caller: Patient    Prescriptions: VICODIN 5-500 MG TABS (HYDROCODONE-ACETAMINOPHEN) 1 by mouth for migraine, every 6 hours as needed  #30 x 0   Entered and Authorized by:   Milinda Antis MD   Signed by:   Milinda Antis MD on 12/25/2009   Method used:   Telephoned to ...       [facility] [address] [phone]* (retail)       [address]       [place], [place]  [address]       Ph: [phone] or [phone]       Fax: [phone]   RxID:   [id number]

## 2010-09-10 NOTE — Progress Notes (Signed)
Summary: refill   Phone Note Refill Request Call back at Home Phone [phone] Message from:  Patient  Refills Requested: Medication #1:  TOPAMAX 100 MG  TABS 1 tablet two times a day for headache prevention Initial call taken by: De Nurse,  January 15, 2010 10:10 AM    Prescriptions: TOPAMAX 100 MG  TABS (TOPIRAMATE) 1 tablet two times a day for headache prevention  #60 Tablet x 4   Entered and Authorized by:   Milinda Antis MD   Signed by:   Milinda Antis MD on 01/15/2010   Method used:   Electronically to        [facility] [address] [phone]* (retail)       [address]       [place], [place]  [address]       Ph: [phone] or [phone]       Fax: [phone]   RxID:   [id number]

## 2010-09-10 NOTE — Miscellaneous (Signed)
Summary: Med Change  Medications Added SIMVASTATIN 40 MG TABS (SIMVASTATIN) 1 by mouth daily for cholesterol       Clinical Lists Changes  Medications: Removed medication of LIPITOR 40 MG TABS (ATORVASTATIN CALCIUM) 1 by mouth daily Added new medication of SIMVASTATIN 40 MG TABS (SIMVASTATIN) 1 by mouth daily for cholesterol - Signed Rx of SIMVASTATIN 40 MG TABS (SIMVASTATIN) 1 by mouth daily for cholesterol;  #31 x 6;  Signed;  Entered by: Milinda Antis MD;  Authorized by: Milinda Antis MD;  Method used: Electronically to [facility] [address] [phone]*, [address] [place], [place], [place]  [address], Ph: [phone] or [phone], Fax: [phone]    Prescriptions: SIMVASTATIN 40 MG TABS (SIMVASTATIN) 1 by mouth daily for cholesterol  #31 x 6   Entered and Authorized by:   Milinda Antis MD   Signed by:   Milinda Antis MD on 10/27/2009   Method used:   Electronically to        [facility] [address] [phone]* (retail)       [address]       [place], [place]  [address]       Ph: [phone] or [phone]       Fax: [phone]   RxID:   [id number]   Prior authoraization needed for Lipitor, will complete. In meantime will start Simvastatin 40mg  daily Pt needs lipid panel as well. Needs CPE  Appended Document: Med Change Lipitor Prior authorization Denied on 10/31/09

## 2010-09-10 NOTE — Letter (Signed)
Summary: Generic Letter  [facility]  [address]   [place], [place] [address]   Phone: [phone]  Fax: [phone]    10/27/2009  Dalis Oyen [address] [place], [place]  [address]    Dear Ms. Felizardo Hoffmann,   I have received correspondence that your LIPITOR (Cholesterol lowering medication) is no longer being covered by your insurance. I will complete a prior authorization form but I can not guarantee coverage. In the meantime I will start Simvastatin 40mg  daily.  You are overdue for your Complete physical Exam, which is not a sick visit. Please schedule an exam in the next 1-2 months. Please try to schedule a morning appointment as you will not be able to eat after midnight because lab work is needed.      Sincerely,   Milinda Antis MD  Appended Document: Generic Letter lipitor denied. papers to pcp

## 2010-09-10 NOTE — Letter (Signed)
Summary: Generic Letter  [facility]  [address]   [place], [place] [address]   Phone: [phone]  Fax: [phone]    12/11/2009  BETHZY HAUCK [address] [place], [place]  [address]  Dear Dr. Andrey Campanile;   [name] is a shared patient of ours. While she is primary under your care for history of seizure disorder she is also being seen for migraines. Her records show she has been on multiple medications for her migraines which have failed to control her symptoms. She has also been [facility] here in [place] and plans to to see a Specialist in [place].   I would like your input regarding her medications below. After discussion with our behavioral therapist who is also treating Depression in Mrs. Malhotra, we found that Lamictal can cause daily headaches Questions:  1. Do you have any record of treatment with Depakote in the past?  2. Do you have an alternative to Lamictal and would you consider switching?  3. Per Patient Nortriptyline prescribed for Depression, this has not helped, would you consider discontinuing this medication?  I am weary to change her medications with so many specialist involved.  Current Medications: 1)  ALBUTEROL 90 MCG/ACT AERS (ALBUTEROL) inhale 2 puffs every 4 hours if needed for short of breath 2)  LAMICTAL 150 MG  TABS (LAMOTRIGINE) 1 tablet by mouth bid 3)  MIRALAX   POWD (POLYETHYLENE GLYCOL 3350) 17g by mouth 1-3 times daily as needed constipation disp largest bottle 4)  PRILOSEC 40 MG CPDR (OMEPRAZOLE) Take one 30-60 min before first and last meals of the day 5)  SPIRIVA HANDIHALER 18 MCG  CAPS (TIOTROPIUM BROMIDE MONOHYDRATE) inhale 1  capsule daily, disp 1 handihaler 6)  TOPAMAX 100 MG  TABS (TOPIRAMATE) 1 tablet two times a day for headache prevention 7)  LACTULOSE 10 GM/15ML  SOLN (LACTULOSE) take 30-62mL as needed for soft bowel movements.  disp 8)  DICYCLOMINE HCL 20 MG  TABS (DICYCLOMINE HCL) 1  by mouth one hour before meals and at bedtime 9)  VITAMIN D3 2000 UNIT CAPS (CHOLECALCIFEROL) 1 by mouth daily 10)  FLONASE 50 MCG/ACT SUSP (FLUTICASONE PROPIONATE) 2 sprays each nostril daily disp: one inhaler 11)  VICODIN 5-500 MG TABS (HYDROCODONE-ACETAMINOPHEN) 1 by mouth for migraine, every 6 hours as needed 12)  NORTRIPTYLINE HCL 10 MG CAPS (NORTRIPTYLINE HCL) take 3 tablets at bedtime- prescribed by Dr. Andrey Campanile neuro 13)  SIMVASTATIN 40 MG TABS (SIMVASTATIN) 1 by mouth daily for cholesterol 14)  SARNA 0.5-0.5 % LOTN (CAMPHOR-MENTHOL) apply to skin twice a day as needed itching 15)  VISTARIL 50 MG CAPS (HYDROXYZINE PAMOATE) Take 1.5 tab every 6 hours as needed for itching   Sincerely,   Milinda Antis MD  Appended Document: Generic Letter Wrong physician name

## 2010-09-10 NOTE — Assessment & Plan Note (Signed)
Summary: trouble walking,df   Vital Signs:  Patient profile:   55 year old female Height:      60.25 inches Temp:     98.4 degrees F oral Pulse rate:   74 / minute BP sitting:   124 / 84  (left arm) Cuff size:   regular  Vitals Entered By: Jimmy Footman, CMA (June 19, 2010 10:38 AM) CC: headache x2 weeks, trouble walking Is Patient Diabetic? Yes Did you bring your meter with you today? No Pain Assessment Patient in pain? yes     Location: body Intensity: 10   Primary Care Provider:  Milinda Antis MD  CC:  headache x2 weeks and trouble walking.  History of Present Illness:    Headache - seen in ER NOv 4th for HA and difficulty walking reviewed note, no mention of gait but stated reviewed MRI, given dilaudid, IV fluids, reglan and HA resolved per report and pt discharged. HA stil present states last visit the IM meds did not relieve the pain, also unable to take the doxcycline secondary to nausea and feeling like her throat was going to close up but did not call MD  Vicodin only thing that helps HA, will see Neuro tomorrow  Falling still-  States unable to walk this weekend, see above, husband Westly Pam at visit, states her gait is very wobbly, she was so weak in the lower ext she could not walk, feels she if going to fall, will see Neuro tomorrow, no change in bowel or bladder, concerned she has Muscular dystrophy like her father. no change in meds   Habits & Providers  Alcohol-Tobacco-Diet     Tobacco Status: current     Tobacco Counseling: to quit use of tobacco products     Cigarette Packs/Day: 1.0  Current Medications (verified): 1)  Albuterol 90 Mcg/act Aers (Albuterol) .... Inhale 2 Puffs Every 4 Hours If Needed For Short of Breath 2)  Lamictal 150 Mg  Tabs (Lamotrigine) .Marland Kitchen.. 1 Tablet By Mouth Bid 3)  Miralax   Powd (Polyethylene Glycol 3350) .Marland Kitchen.. 17g By Mouth 1-3 Times Daily As Needed Constipation Disp Largest Bottle 4)  Prilosec 40 Mg Cpdr (Omeprazole) .... Take  One 30-60 Min Before First and Last Meals of The Day 5)  Spiriva Handihaler 18 Mcg  Caps (Tiotropium Bromide Monohydrate) .... Inhale 1  Capsule Daily, Disp 1 Handihaler 6)  Topamax 100 Mg  Tabs (Topiramate) .Marland Kitchen.. 1 Tablet Two Times A Day For Headache Prevention 7)  Lactulose 10 Gm/67ml  Soln (Lactulose) .... Take 30-35ml As Needed For Soft Bowel Movements.  Disp 8)  Dicyclomine Hcl 20 Mg  Tabs (Dicyclomine Hcl) .Marland Kitchen.. 1 By Mouth One Hour Before Meals and At Bedtime 9)  Vitamin D3 2000 Unit Caps (Cholecalciferol) .Marland Kitchen.. 1 By Mouth Daily 10)  Flonase 50 Mcg/act Susp (Fluticasone Propionate) .... 2 Sprays Each Nostril Daily Disp: One Inhaler 11)  Vicodin 5-500 Mg Tabs (Hydrocodone-Acetaminophen) .Marland Kitchen.. 1 By Mouth For Migraine, Every 6 Hours As Needed 12)  Simvastatin 40 Mg Tabs (Simvastatin) .Marland Kitchen.. 1 By Mouth Daily For Cholesterol 13)  Claritin 10 Mg Caps (Loratadine) .Marland Kitchen.. 1 By Mouth Daily As Needed Allergies 14)  Clobetasol Propionate 0.05 % Crea (Clobetasol Propionate) .... Apply To Rash On Hands Twice A Day X 10 Days 15)  Nortriptyline Hcl 10 Mg Caps (Nortriptyline Hcl) .... Take 3 Tabs By Mouth At Bedtime Per Neurologist  Allergies (verified): 1)  ! Sulfa 2)  ! Biaxin 3)  ! Penicillin 4)  !  Advair Diskus (Fluticasone-Salmeterol) 5)  ! Doxycycline Hyclate (Doxycycline Hyclate)  Family History: Reviewed history from 03/21/2009 and no changes required. Becker`s MD in son and dad, Mom has HTN, No first degree CAD or DM         Lung CA- Father and Mother (both were smokers)  Social History: Smokes 1 ppd; Husband--lacy Vincent, 2 sons [place] and [place]; Not currently working, on disability.t.   Denies alcohol and drug use.     + smoker 1, heavy useage, always smells like smoke.   Current smoker since age 25. Smokes 1 ppd.  Physical Exam  General:  NAD, Vital signs noted smells of smoke tearful in wheelchair  Eyes:  no papiledema, PERRL,EOMI,no nystagmus  Neck:  supple normal ROM Lungs:   equal air movement, no wheeze, normal WOB Neurologic:  alert & oriented X3,  strength normal in all extremities.  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 Orders: [facility]- Est Level  3 (09811)  Problem # 2:  MIGRAINE, COMMON, INTRACTABLE (ICD-346.11) Assessment: Deteriorated Given abortive treatment x 2, therefore difficult to assess accuracy of pain, will refill vicodin, pt still has not seen [facility] to [facility], multiple medications tried inpast , difficult becuase of "seizure" history Her updated medication list for this problem includes:    Vicodin 5-500 Mg Tabs (Hydrocodone-acetaminophen) .Marland Kitchen... 1 by mouth for migraine, every 6 hours as needed  Orders: [facility]- Est Level  3 (91478)  Complete Medication List: 1)  Albuterol 90 Mcg/act Aers (Albuterol) .... Inhale 2 puffs every 4 hours if needed for short of breath 2)  Lamictal 150 Mg Tabs (Lamotrigine) .Marland Kitchen.. 1 tablet by mouth bid 3)  Miralax Powd (Polyethylene glycol 3350) .Marland Kitchen.. 17g by mouth 1-3 times daily as needed constipation disp largest bottle 4)  Prilosec 40 Mg Cpdr (Omeprazole) .... Take one 30-60 min before first and last meals of the day 5)  Spiriva Handihaler 18 Mcg Caps (Tiotropium bromide monohydrate) .... Inhale 1  capsule daily, disp 1 handihaler 6)  Topamax 100 Mg Tabs (Topiramate) .Marland Kitchen.. 1 tablet two times a day for headache prevention 7)  Lactulose  10 Gm/43ml Soln (Lactulose) .... Take 30-82ml as needed for soft bowel movements.  disp 8)  Dicyclomine Hcl 20 Mg Tabs (Dicyclomine hcl) .Marland Kitchen.. 1 by mouth one hour before meals and at bedtime 9)  Vitamin D3 2000 Unit Caps (Cholecalciferol) .Marland Kitchen.. 1 by mouth daily 10)  Flonase 50 Mcg/act Susp (Fluticasone propionate) .... 2 sprays each nostril daily disp: one inhaler 11)  Vicodin 5-500 Mg Tabs (Hydrocodone-acetaminophen) .Marland Kitchen.. 1 by mouth for migraine, every 6 hours as needed 12)  Simvastatin 40 Mg Tabs (Simvastatin) .Marland Kitchen.. 1 by mouth daily for cholesterol 13)  Claritin 10 Mg Caps (Loratadine) .Marland Kitchen.. 1 by mouth daily as needed allergies 14)  Clobetasol Propionate 0.05 % Crea (Clobetasol propionate) .... Apply to rash on hands twice a day x 10 days 15)  Nortriptyline  Hcl 10 Mg Caps (Nortriptyline hcl) .... Take 3 tabs by mouth at bedtime per neurologist   Patient Instructions: 1)  I will call after you see Dr. Anne Hahn 2)  Use the Vicodin as needed for headache  Prescriptions: VICODIN 5-500 MG TABS (HYDROCODONE-ACETAMINOPHEN) 1 by mouth for migraine, every 6 hours as needed  #30 x 1   Entered and Authorized by:   Milinda Antis MD   Signed by:   Milinda Antis MD on 06/19/2010   Method used:   Handwritten   RxID:   [id number]    Orders Added: 1)  [facility]- Est Level  3 [56213]

## 2010-09-10 NOTE — Assessment & Plan Note (Signed)
Summary: ? bronchitis,tcb   Vital Signs:  Patient profile:   55 year old female Weight:      188.6 pounds BMI:     36.66 O2 Sat:      96 % on Room air Temp:     98.1 degrees F Pulse rate:   84 / minute BP sitting:   123 / 81  (left arm)  Vitals Entered By: Starleen Blue RN (October 10, 2009 10:52 AM)  O2 Flow:  Room air CC: ? bronchitis/itching Is Patient Diabetic? Yes Pain Assessment Patient in pain? yes     Location: head Intensity: 10   Primary Care Provider:  Milinda Antis MD  CC:  ? bronchitis/itching.  History of Present Illness:     1.Bronchitis- past 2 days increased wheezing and sob, requiring increase use of nebulizer. Cough is productive of sputum thick sputum/yellow/white   , still smokes 1ppd. Using CPAP at night  Denies fever, chest pain   2.Itching - occasionally sees spots on legs that dissaaper, previous work-up negative for itching. H1 blocker not helping, also tried OTC benadryl which helped some. hx of dry skin.      Habits & Providers  Alcohol-Tobacco-Diet     Tobacco Status: current     Cigarette Packs/Day: 1.0  Allergies (verified): 1)  ! Sulfa 2)  ! Biaxin 3)  ! Penicillin  Physical Exam  General:  Appeared much older than age, smelled of smoke, did not appear to be in distress. Vital signs noted  Lungs:  +broncospasm, wheeze, increased expiratory flow, decrased BS bases, equal airmovement in setting of bronchospams, no retractions, speaking in full sentences   s/p Albuterol neb- good airmovement, decreased wheeze  Heart:  RRR Skin:  No  rash skin diffusely dry   Impression & Recommendations:  Problem # 1:  COPD (ICD-496) Assessment Deteriorated  Treat for COPD exacerbation, given red flags, as improved with neb and no hypoxia treat as outpatient, Prednisone, Avelox, recently on doxycycline for same. RTC 2 days for recheck. Encouraged tobacco cessation but pt is not ready to quit in setting of COPD exacerbations. No history  of  intubation for airway trouble Her updated medication list for this problem includes:    Albuterol 90 Mcg/act Aers (Albuterol) ..... Inhale 2 puffs every 4 hours if needed for short of breath    Spiriva Handihaler 18 Mcg Caps (Tiotropium bromide monohydrate) ..... Inhale 1  capsule daily, disp 1 handihaler  Orders: [facility]- Est  Level 4 (30865)  Problem # 2:  PRURITUS (ICD-698.9) Assessment: Unchanged  unclear etiology, reviewed previous labs, no rash, dry skin. supportive care. Atarax and sarna lotion  Orders: [facility]- Est  Level 4 (78469)  Problem # 3:  TOBACCO DEPENDENCE (ICD-305.1) Assessment: Unchanged  Orders: [facility]- Est  Level 4 (99214)  Complete Medication List: 1)  Albuterol 90 Mcg/act Aers (Albuterol) .... Inhale 2 puffs every 4 hours if needed for short of breath 2)  Lamictal 150 Mg Tabs (Lamotrigine) .Marland Kitchen.. 1 tablet by mouth bid 3)  Miralax Powd (Polyethylene glycol 3350) .Marland Kitchen.. 17g by mouth 1-3 times daily as needed constipation disp largest bottle 4)  Prilosec 40 Mg Cpdr (Omeprazole) .... Take one 30-60 min before first and last meals of the day 5)  Spiriva Handihaler 18 Mcg Caps (Tiotropium bromide monohydrate) .... Inhale 1  capsule daily, disp 1 handihaler 6)  Topamax 100 Mg Tabs (Topiramate) .Marland Kitchen.. 1 tablet two times a day for headache prevention 7)  Lactulose 10 Gm/21ml Soln (Lactulose) .Marland KitchenMarland KitchenMarland Kitchen  Take 30-57ml as needed for soft bowel movements.  disp 8)  Dicyclomine Hcl 20 Mg Tabs (Dicyclomine hcl) .Marland Kitchen.. 1 by mouth one hour before meals and at bedtime 9)  Vitamin D3 2000 Unit Caps (Cholecalciferol) .Marland Kitchen.. 1 by mouth daily 10)  Flonase 50 Mcg/act Susp (Fluticasone propionate) .... 2 sprays each nostril daily disp: one inhaler 11)  Simvastatin 40 Mg Tabs (Simvastatin) .Marland Kitchen.. 1 by mouth daily 12)  Lipitor 40 Mg Tabs (Atorvastatin calcium) .Marland Kitchen.. 1 by mouth daily 13)  Bacitracin 500 Unit/gm Oint (Bacitracin) .... Apply to affected aerea on belly button two times a day x 7 days 14)   Vicodin 5-500 Mg Tabs (Hydrocodone-acetaminophen) .Marland Kitchen.. 1 by mouth for migraine, every 6 hours as needed 15)  Prednisone 20 Mg Tabs (Prednisone) .... Take 40mg  by mouth daily x 5 days  qs 16)  Avelox 400 Mg Tabs (Moxifloxacin hcl) .Marland Kitchen.. 1 by mouth daily x 5 days 17)  Hydroxyzine Hcl 25 Mg Tabs (Hydroxyzine hcl) .Marland Kitchen.. 1 by mouth q 6hrs as needed itching, do not drink alcohol with this  Patient Instructions: 1)  Use your inhaler every 4 hours for the next 48 hours 2)  Then as needed 3)  Start the antibiotics and prednisone 4)  Return on Friday to be seen by work-in doctor  5)  If you have worse SOB then go to ER  6)  You can try SARNA lotion for itching or the hydroxyzine Prescriptions: HYDROXYZINE HCL 25 MG TABS (HYDROXYZINE HCL) 1 by mouth Q 6hrs as needed itching, do not drink alcohol with this  #30 x 0   Entered and Authorized by:   Milinda Antis MD   Signed by:   Milinda Antis MD on 10/10/2009   Method used:   [facility]        [facility] [address] [phone]* (retail)       [address]       [place], [place]  [address]       Ph: [phone] or [phone]       Fax: [phone]   RxID:   [id number] AVELOX 400 MG TABS (MOXIFLOXACIN HCL) 1 by mouth daily x 5 days  #5 x 0   Entered and Authorized by:   Milinda Antis MD   Signed by:   Milinda Antis MD on 10/10/2009   Method used:   Electronically to        [facility] [address] [phone]* (retail)       [address]       [place], [place]  [address]       Ph: [phone] or [phone]       Fax: [phone]   RxID:   [id number] PREDNISONE 20 MG TABS (PREDNISONE) Take 40mg  by mouth daily x 5 days  QS  #1 x 0   Entered and Authorized by:   Milinda Antis MD   Signed by:   Milinda Antis MD on 10/10/2009   Method used:   Electronically to        [facility] [address] [phone]* (retail)       [address]       [place], [place]  [address]        Ph: [phone] or [phone]       Fax: [phone]   RxID:   [id number]  Vital Signs:  Patient profile:   55 year old female Weight:      188.6 pounds BMI:     36.66 O2 Sat:      96 % Temp:     98.1 degrees F Pulse rate:   84 / minute BP sitting:   123 / 81  (left arm)  Vitals Entered By: Starleen Blue RN (October 10, 2009 10:52 AM)  O2 Flow:  Room air   Appended Document: ? bronchitis,tcb     Medication Administration  Medication # 1:    Medication: Albuterol Sulfate Sol 1mg  unit dose    Diagnosis: COPD (ICD-496)    Dose: 2.5 mg    Route: inhaled    Exp Date: 03/12/2011    Lot #: [id number]    Mfr: Nephron    Given by: Starleen Blue RN (October 10, 2009 12:15 PM)  Orders Added: 1)  Albuterol Sulfate Sol 1mg  unit dose [E4540]

## 2010-09-10 NOTE — Progress Notes (Signed)
   Phone Note Call from Patient Call back at Home Phone [phone]   Caller: Spouse Summary of Call: Call from husband - reports that patient has a headache and weakness all over. Was seen in the emergency room on Friday for similar symptoms and discharged. He wants his wife to be evaluated for this again but does not want his wife to be seen in the hospital ER. They would like to have an appointment tomorrow. Advised to call tomorrow for work in appointment.  Initial call taken by: Bobby Rumpf  MD,  June 17, 2010 8:20 PM

## 2010-09-10 NOTE — Progress Notes (Signed)
Summary: Rx Req   Phone Note Refill Request Call back at Home Phone [phone] Message from:  Patient  Refills Requested: Medication #1:  VICODIN 5-500 MG TABS 1 by mouth for migraine PT SAYS DR [place] USUALLY GIVES THIS TO HER FOR HER HEADACHES.  Initial call taken by: Clydell Hakim,  January 31, 2010 2:16 PM  Follow-up for Phone Call        Rx completed and placed in [facility] "To Do' box. Please call pateint for pick-up. Follow-up by: Helane Rima DO,  February 01, 2010 8:13 AM  Additional Follow-up for Phone Call Additional follow up Details #1::        called pt informed rx at front desk. Additional Follow-up by: Tessie Fass CMA,  February 01, 2010 9:14 AM    Prescriptions: VICODIN 5-500 MG TABS (HYDROCODONE-ACETAMINOPHEN) 1 by mouth for migraine, every 6 hours as needed  #30 x 0   Entered and Authorized by:   Helane Rima DO   Signed by:   Helane Rima DO on 02/01/2010   Method used:   Print then Give to Patient   RxID:   [id number]

## 2010-09-10 NOTE — Letter (Signed)
Summary: Generic Letter  [facility]  [address]   [place], [place] [address]   Phone: [phone]  Fax: [phone]    06/04/2010  [name] [address] [place], [place]  [address]  Dear Dr. Anne Hahn,      [name] is a mutual patient being treated for her chronic migraines and seizure disorder. Over the past two months she has increasing accidental falls of unknown origin. MRI of brain showed not acute changes and labwork was within normal limits. There were no recent changes in her medications. I would appreciate any recommendations regarding her falls. I have included her MRI report and labs.        Sincerely,   Milinda Antis MD

## 2010-09-10 NOTE — Progress Notes (Signed)
Summary: Order Needed   Phone Note Call from Patient Call back at Home Phone [phone]   Caller: Patient Summary of Call: To have bone density test tomorrow and [facility]. Initial call taken by: Clydell Hakim,  June 06, 2010 8:55 AM  Follow-up for Phone Call        order faxed today. Follow-up by: Tessie Fass CMA,  June 06, 2010 11:34 AM

## 2010-09-10 NOTE — Progress Notes (Signed)
Summary: phn msg   Phone Note Call from Patient Call back at Home Phone [phone]   Caller: Patient Summary of Call: pt has a hard time staying awake and stays tired all the time, would like labs to check Vit D and potassium - pls advise Initial call taken by: De Nurse,  August 21, 2009 3:46 PM  Follow-up for Phone Call        states she is using her CPAP, but falls asleep easily. states sometimes family has a hard time waking her up. wants to be seen when her husband comes in next week. double book her & told pt that she may have to wait until end of clinic to be seen. she was ok with this. will discuss labs with pcp at that time. Follow-up by: Golden Circle RN,  August 21, 2009 3:50 PM

## 2010-09-10 NOTE — Progress Notes (Signed)
Summary: re: BD/TS   ---- Converted from flag ---- ---- 06/10/2010 9:17 PM, Milinda Antis MD wrote: Please let pt know her Bone density shows low bone mass or osteopenia (soft bones), she is already on Vitamin D, she needs to take a total of 1200mg  of calcium daily as well. She can get this over the counter ------------------------------ called pt and lmvm to call back Arlyss Repress CMA,  June 11, 2010 1:59 PM  spoke with pt, informed of the above message.Tessie Fass CMA  June 11, 2010 2:35 PM

## 2010-09-10 NOTE — Consult Note (Signed)
Summary: [facility]   Imported By: Clydell Hakim 03/12/2010 15:43:55  _____________________________________________________________________  External Attachment:    Type:   Image     Comment:   External Document

## 2010-09-10 NOTE — Progress Notes (Signed)
Summary: Schedule initial beh-med   Phone Note Call from Patient   Caller: Patient Call For: Spero Geralds, Psy.D. Summary of Call: Patient called to schedule an appt.  Scheduled for first available:  March 23rd at 2:00.  Asked her to call if she is unable to attend. Initial call taken by: Spero Geralds PsyD,  October 17, 2009 4:19 PM

## 2010-09-10 NOTE — Progress Notes (Signed)
   Phone Note Outgoing Call   Call placed by: Milinda Antis MD,  November 23, 2009 11:10 AM Details for Reason: Headaches Summary of Call: Pt here yesterday for office visit had to leave secondary to a family emergency, having severe migraines like her normal, only has her seizure meds at home and taking tylenol. Pt unable to come in today has an appt for 4/29. I will give her a refill on her vicodin for migraines, I have noted her other many medications and specialist for her headaches which have either failed or she is unable to get. Pt did mention she thinks her medicaid has run out Initial call taken by: Milinda Antis MD,  November 23, 2009 11:11 AM    Prescriptions: VICODIN 5-500 MG TABS (HYDROCODONE-ACETAMINOPHEN) 1 by mouth for migraine, every 6 hours as needed  #40 x 0   Entered and Authorized by:   Milinda Antis MD   Signed by:   Milinda Antis MD on 11/23/2009   Method used:   Telephoned to ...       [facility] [address] [phone]* (retail)       [address]       [place], [place]  [address]       Ph: [phone] or [phone]       Fax: [phone]   RxID:   [id number]

## 2010-09-10 NOTE — Assessment & Plan Note (Signed)
Summary: flu shot,df   Nurse Visit   Allergies: 1)  ! Sulfa 2)  ! Biaxin 3)  ! Penicillin 4)  ! Advair Diskus (Fluticasone-Salmeterol)  Immunizations Administered:  Influenza Vaccine # 1:    Vaccine Type: Fluvax Non-MCR    Site: right deltoid    Mfr: GlaxoSmithKline    Dose: 0.5 ml    Route: IM    Given by: Arlyss Repress CMA,    Exp. Date: 02/05/2011    Lot #: [id number]    VIS given: 03/05/10 version given May 23, 2010.  Flu Vaccine Consent Questions:    Do you have a history of severe allergic reactions to this vaccine? no    Any prior history of allergic reactions to egg and/or gelatin? no    Do you have a sensitivity to the preservative Thimersol? no    Do you have a past history of Guillan-Barre Syndrome? no    Do you currently have an acute febrile illness? no    Have you ever had a severe reaction to latex? no    Vaccine information given and explained to patient? yes    Are you currently pregnant? no  Orders Added: 1)  Influenza Vaccine NON MCR [00028] 2)  Admin 1st Vaccine [[id number]]

## 2010-09-10 NOTE — Assessment & Plan Note (Signed)
Summary: dry itchy skin & labs/[place]   Vital Signs:  Patient profile:   55 year old female Height:      60.25 inches Weight:      190 pounds Temp:     98.0 degrees F oral Pulse rate:   79 / minute BP sitting:   102 / 69  (left arm) Cuff size:   large  Vitals Entered By: Arlyss Repress CMA, (August 27, 2009 10:09 AM) CC: trouble staying awake, headaches Is Patient Diabetic? Yes Pain Assessment Patient in pain? yes     Location: head Intensity: 10   Primary Care Provider:  Milinda Antis MD  CC:  trouble staying awake and headaches.  History of Present Illness:  1.Trouble staying awake- pt has been Falling asleep during the day, watchiing TV and on computer x 1 month. No falls, no confusion, OSA- using CPAP every night, no change in medication  Bedtime between 10 and 11pm no difficulty falling asleep or staying asleep  2.Itching daily for 2 months. No specific rash, tried OTC benadaryl, son also itching but has a rash present  3.Headaches still occur, was not able to see the physician at [facility] as unable to get there, Fiorecet has not helped, Vicodin does help some. Continues to take Topamax  currently with headache and wants an abortive medication today   Habits & Providers  Alcohol-Tobacco-Diet     Tobacco Status: current     Cigarette Packs/Day: 1.0  Current Medications (verified): 1)  Albuterol 90 Mcg/act Aers (Albuterol) .... Inhale 2 Puffs Every 4 Hours If Needed For Short of Breath 2)  Lamictal 150 Mg  Tabs (Lamotrigine) .Marland Kitchen.. 1 Tablet By Mouth Bid 3)  Miralax   Powd (Polyethylene Glycol 3350) .Marland Kitchen.. 17g By Mouth 1-3 Times Daily As Needed Constipation Disp Largest Bottle 4)  Prilosec 40 Mg Cpdr (Omeprazole) .... Take One 30-60 Min Before First and Last Meals of The Day 5)  Spiriva Handihaler 18 Mcg  Caps (Tiotropium Bromide Monohydrate) .... Inhale 1  Capsule Daily, Disp 1 Handihaler 6)  Topamax 100 Mg  Tabs (Topiramate) .Marland Kitchen.. 1 Tablet Two Times A Day For Headache  Prevention 7)  Zyrtec Allergy 10 Mg  Tabs (Cetirizine Hcl) .Marland Kitchen.. 1 Once Daily Prn 8)  Lactulose 10 Gm/40ml  Soln (Lactulose) .... Take 30-39ml As Needed For Soft Bowel Movements.  Disp 9)  Dicyclomine Hcl 20 Mg  Tabs (Dicyclomine Hcl) .Marland Kitchen.. 1 By Mouth One Hour Before Meals and At Bedtime 10)  Vitamin D3 2000 Unit Caps (Cholecalciferol) .Marland Kitchen.. 1 By Mouth Daily 11)  Flonase 50 Mcg/act Susp (Fluticasone Propionate) .... 2 Sprays Each Nostril Daily Disp: One Inhaler 12)  Simvastatin 40 Mg Tabs (Simvastatin) .Marland Kitchen.. 1 By Mouth Daily 13)  Lipitor 40 Mg Tabs (Atorvastatin Calcium) .Marland Kitchen.. 1 By Mouth Daily 14)  Bacitracin 500 Unit/gm Oint (Bacitracin) .... Apply To Affected Aerea On Belly Button Two Times A Day X 7 Days  Allergies (verified): 1)  ! Sulfa 2)  ! Biaxin 3)  ! Penicillin  Review of Systems       Per HPI  Physical Exam  General:  Appeared much older than age, smelled of smoke, did not appear to be in distress. Vital signs noted  Eyes:  PERL, EOM full Mouth:  MMM Neck:  supple and full ROM.   Lungs:  normal respiratory effort , CTAB but distant Heart:  RRR Abdomen:  +BS, soft, mild ttp over umbilcus, no mass Extremities:  No edema Neurologic:  alert &  oriented X3, cranial nerves II-XII intact, and strength normal in all extremities.   Skin:  No  rash redness over the belly button, no pus expressed   Impression & Recommendations:  Problem # 1:  FATIGUE (ICD-780.79) Assessment New New onset fatigue, not related to respiratory illness. WIll check thyroid, Renal function , Vit D and ammonia level. CBC for anemia Weight gain also likely contributing. Pt does not drive at baseline, therefore safe at home Orders: Comp Met-[facility] [phone]) CBC-[facility] [phone]) Vit D, 25 OH-[facility] [phone]) TSH-[facility] [phone]) Ammonia-[facility] ([id number]) [facility]- Est  Level 4 (24401)  Problem # 2:  DIABETES MELLITUS II, UNCOMPLICATED (ICD-250.00) Assessment: Unchanged Diet controlled.   Orders: A1C-[facility] (02725) [facility]- Est  Level 4 ([address]644)  Problem # 3:  PRURITUS (ICD-698.9) Assessment: New Unclear cause, no rash noted on exam. Check LFT's, TSH. Restart H2 blocker, Benadryl as needed  Mild erythema over belly button- treat with as needed bacitracin Orders: Benadryl  IM or IV (J1200) [facility]- Est  Level 4 ([address]214)  Problem # 4:  MIGRAINE, UNSPEC., W/O INTRACTABLE MIGRAINE (ICD-346.90) Assessment: Deteriorated  Will continue Vicodin. IM meds today The following medications were removed from the medication list:    Fioricet 50-325-40 Mg Tabs (Butalbital-apap-caffeine) .Marland Kitchen... 1 tab po  at start of migraine headache, can repeat in 4 hours. do not take more than 6 tablets in one day    Vicodin 5-500 Mg Tabs (Hydrocodone-acetaminophen) .Marland Kitchen... 1 by mouth q4hrs as needed pain    Meloxicam 7.5 Mg Tabs (Meloxicam) .Marland Kitchen... 1 by mouth daily as needed pain in neck Her updated medication list for this problem includes:    Vicodin 5-500 Mg Tabs (Hydrocodone-acetaminophen) .Marland Kitchen... 1 by mouth for migraine, every 6 hours as needed  Orders: [facility]- Est  Level 4 ([address]214)  Complete Medication List: 1)  Albuterol 90 Mcg/act Aers (Albuterol) .... Inhale 2 puffs every 4 hours if needed for short of breath 2)  Lamictal 150 Mg Tabs (Lamotrigine) .Marland Kitchen.. 1 tablet by mouth bid 3)  Miralax Powd (Polyethylene glycol 3350) .Marland Kitchen.. 17g by mouth 1-3 times daily as needed constipation disp largest bottle 4)  Prilosec 40 Mg Cpdr (Omeprazole) .... Take one 30-60 min before first and last meals of the day 5)  Spiriva Handihaler 18 Mcg Caps (Tiotropium bromide monohydrate) .... Inhale 1  capsule daily, disp 1 handihaler 6)  Topamax 100 Mg Tabs (Topiramate) .Marland Kitchen.. 1 tablet two times a day for headache prevention 7)  Zyrtec Allergy 10 Mg Tabs (Cetirizine hcl) .Marland Kitchen.. 1 once daily prn 8)  Lactulose 10 Gm/3ml Soln (Lactulose) .... Take 30-70ml as needed for soft bowel movements.  disp 9)  Dicyclomine Hcl 20 Mg Tabs (Dicyclomine  hcl) .Marland Kitchen.. 1 by mouth one hour before meals and at bedtime 10)  Vitamin D3 2000 Unit Caps (Cholecalciferol) .Marland Kitchen.. 1 by mouth daily 11)  Flonase 50 Mcg/act Susp (Fluticasone propionate) .... 2 sprays each nostril daily disp: one inhaler 12)  Simvastatin 40 Mg Tabs (Simvastatin) .Marland Kitchen.. 1 by mouth daily 13)  Lipitor 40 Mg Tabs (Atorvastatin calcium) .Marland Kitchen.. 1 by mouth daily 14)  Bacitracin 500 Unit/gm Oint (Bacitracin) .... Apply to affected aerea on belly button two times a day x 7 days 15)  Vicodin 5-500 Mg Tabs (Hydrocodone-acetaminophen) .Marland Kitchen.. 1 by mouth for migraine, every 6 hours as needed  Other Orders: Ketorolac-Toradol 15mg  (I3474) Promethazine up to 50mg  (Q5956)  Patient Instructions: 1)  Use the  antibiotic for you skin infection around your belly button 2)  Try to get an  appt with Kendell Bane for your heaches 3)  I will call you with your lab results 4)  For your itching you can take Benadryl, also start taking the Certrizine 5)  Your A1C was 5.2 6)  Follow-up in 1 month for your fatigue 7)  If you belly button is not getting better by Friday with the cream call me back Prescriptions: VICODIN 5-500 MG TABS (HYDROCODONE-ACETAMINOPHEN) 1 by mouth for migraine, every 6 hours as needed  #40 x 0   Entered and Authorized by:   Milinda Antis MD   Signed by:   Milinda Antis MD on 08/27/2009   Method used:   Telephoned to ...       [facility] [address] [phone]* (retail)       [address]       [place], [place]  [address]       Ph: [phone] or [phone]       Fax: [phone]   RxID:   [id number] BACITRACIN 500 UNIT/GM OINT (BACITRACIN) Apply to affected aerea on belly button two times a day x 7 days  #1 x 0   Entered and Authorized by:   Milinda Antis MD   Signed by:   Milinda Antis MD on 08/27/2009   Method used:   Electronically to        [facility] [address] [phone]* (retail)       [address]       [place],  [place]  [address]       Ph: [phone] or [phone]       Fax: [phone]   RxID:   [id number] ZYRTEC ALLERGY 10 MG  TABS (CETIRIZINE HCL) 1 once daily prn  #30 x 3   Entered and Authorized by:   Milinda Antis MD   Signed by:   Milinda Antis MD on 08/27/2009   Method used:   Electronically to        [facility] [address] [phone]* (retail)       [address]       [place], [place]  [address]       Ph: [phone] or [phone]       Fax: [phone]   RxID:   [id number] LACTULOSE 10 GM/15ML  SOLN (LACTULOSE) take 30-70mL as needed for soft bowel movements.  disp  #1 x 1   Entered and Authorized by:   Milinda Antis MD   Signed by:   Milinda Antis MD on 08/27/2009   Method used:   Electronically to        [facility] [address] [phone]* (retail)       [address]       [place], [place]  [address]       Ph: [phone] or [phone]       Fax: [phone]   RxID:   [id number] SPIRIVA HANDIHALER 18 MCG  CAPS (TIOTROPIUM BROMIDE MONOHYDRATE) inhale 1  capsule daily, disp 1 handihaler  #1 x 6   Entered and Authorized by:   Milinda Antis MD   Signed by:   Milinda Antis MD on 08/27/2009   Method used:   Electronically to        [facility] [address] [phone]* (retail)       [address]  [place], [place]  [address]       Ph: [phone] or [phone]       Fax: [phone]   RxID:   [id number] ALBUTEROL 90 MCG/ACT AERS (ALBUTEROL) inhale 2 puffs every 4 hours if needed for short of breath  #1 x 6   Entered and Authorized by:   Milinda Antis MD   Signed by:   Milinda Antis MD on 08/27/2009   Method used:   Electronically to        [facility] [address] [phone]* (retail)       [address]       [place], [place]  [address]       Ph: [phone] or [phone]       Fax: [phone]   RxID:   [id number] LIPITOR 40 MG TABS (ATORVASTATIN CALCIUM) 1 by  mouth daily  #30 x 12   Entered and Authorized by:   Milinda Antis MD   Signed by:   Milinda Antis MD on 08/27/2009   Method used:   Electronically to        [facility] [address] [phone]* (retail)       [address]       [place], [place]  [address]       Ph: [phone] or [phone]       Fax: [phone]   RxID:   [id number]   Laboratory Results   Blood Tests   Date/Time Received: August 27, 2009 10:15 AM  Date/Time Reported: August 27, 2009 11:05 AM   HGBA1C: 5.3%   (Normal Range: Non-Diabetic - 3-6%   Control Diabetic - 6-8%)  Comments: ...........test performed by...........Marland KitchenTerese Door, CMA      Medication Administration  Injection # 1:    Medication: Benadryl  IM or IV    Diagnosis: PRURITUS (ICD-698.9)    Route: IM    Site: LUOQ gluteus    Exp Date: 03/12/2011    Lot #: [id number]    Mfr: [email or web address]    Comments: 1/2 ml  given. 25 mg per dr.Clyde (50mg /19ml)    Patient tolerated injection without complications    Given by: Arlyss Repress CMA, (August 27, 2009 11:45 AM)  Injection # 2:    Medication: Ketorolac-Toradol 15mg     Diagnosis: HEADACHE (ICD-784.0)    Route: IM    Site: LUOQ gluteus    Exp Date: 12/10/2010    Lot #: [id number]    Mfr: [email or web address]    Comments: 30 mg given per dr.[place] (30mg /84ml) 1ml given PT DID NOT DRIVE. PT'S HUSBAND LACY IS DRIVING.    Patient tolerated injection without complications    Given by: Arlyss Repress CMA, (August 27, 2009 11:46 AM)  Injection # 3:    Medication: Promethazine up to 50mg     Diagnosis: HEADACHE (ICD-784.0)    Route: IM    Site: RUOQ gluteus    Exp Date: 04/12/2011    Lot #: [id number]    Mfr: [email or web address]    Comments: 12.5mg  given per dr.Marion, 1/2 ml given (25mg /51ml)    Patient tolerated injection without complications    Given by: Arlyss Repress CMA, (August 27, 2009 11:48 AM)  Orders Added: 1)  A1C-[facility] [83036] 2)  Comp Met-[facility] [52778-24235] 3)  CBC-[facility] [85027] 4)  Vit D,  25 OH-[facility] [36144-31540] 5)  TSH-[facility] [08676-19509] 6)  Ammonia-[facility] [82140-82147] 7)  Benadryl  IM or IV [J1200] 8)  Ketorolac-Toradol 15mg  [J1885] 9)  Promethazine up to 50mg  [J2550] 10)  [facility]- Est  Level 4 [[address]]

## 2010-09-10 NOTE — Assessment & Plan Note (Signed)
Summary: Headache, rash   Vital Signs:  Patient profile:   55 year old female Height:      60.25 inches Weight:      189 pounds BMI:     36.74 Temp:     97.7 degrees F oral Pulse rate:   77 / minute BP sitting:   110 / 76  (left arm) Cuff size:   large  Vitals Entered By: Tessie Fass CMA (December 07, 2009 8:50 AM) CC: headaches and right knee pain Is Patient Diabetic? Yes Pain Assessment Patient in pain? yes     Location: head and knee Intensity: 10   Primary Care Provider:  Milinda Antis MD  CC:  headaches and right knee pain.  History of Present Illness:  1. Headaches- pt continues to have daily headaches, reviewed last nuerology note, Vicodin helps, but takes 2 tabs a day for pain, has not noticed difference on Topamax or nortriptyline which she started secondary to depressive episode. Denies Nausea today, headache similar to previous. Would like injection today. Plans to see [facility] now that she has transportation.  - reviewed previous Neurolgoy note, and noted multiple meds tried in the past  2. Knee pain- history of bilat knee pain/arthritis with history of morbid obestity. A week ago was walking around and had a popping sound in her knee, pt admits to having this sound at times, a few days later she fell in the bathtub. + swelling, + tenderness along bottom of knee cap and inner portion of thigh as well as behind the knee, able to walk but with a limp, did not try any  meds or ice/heat  3. Rash- family had bed bugs , now remove carpet and cleaned sheets/towels/clothes etc. Now with a rash x 1 week which is very pruritic. Used Permetrhin, cortisone, Atarax and benadryl without any relief. No fever, no joint aches beside knee above  Habits & Providers  Alcohol-Tobacco-Diet     Tobacco Status: current     Tobacco Counseling: to quit use of tobacco products     Cigarette Packs/Day: 1.0  Current Medications (verified): 1)  Albuterol 90 Mcg/act Aers (Albuterol) ....  Inhale 2 Puffs Every 4 Hours If Needed For Short of Breath 2)  Lamictal 150 Mg  Tabs (Lamotrigine) .Marland Kitchen.. 1 Tablet By Mouth Bid 3)  Miralax   Powd (Polyethylene Glycol 3350) .Marland Kitchen.. 17g By Mouth 1-3 Times Daily As Needed Constipation Disp Largest Bottle 4)  Prilosec 40 Mg Cpdr (Omeprazole) .... Take One 30-60 Min Before First and Last Meals of The Day 5)  Spiriva Handihaler 18 Mcg  Caps (Tiotropium Bromide Monohydrate) .... Inhale 1  Capsule Daily, Disp 1 Handihaler 6)  Topamax 100 Mg  Tabs (Topiramate) .Marland Kitchen.. 1 Tablet Two Times A Day For Headache Prevention 7)  Lactulose 10 Gm/59ml  Soln (Lactulose) .... Take 30-20ml As Needed For Soft Bowel Movements.  Disp 8)  Dicyclomine Hcl 20 Mg  Tabs (Dicyclomine Hcl) .Marland Kitchen.. 1 By Mouth One Hour Before Meals and At Bedtime 9)  Vitamin D3 2000 Unit Caps (Cholecalciferol) .Marland Kitchen.. 1 By Mouth Daily 10)  Flonase 50 Mcg/act Susp (Fluticasone Propionate) .... 2 Sprays Each Nostril Daily Disp: One Inhaler 11)  Vicodin 5-500 Mg Tabs (Hydrocodone-Acetaminophen) .Marland Kitchen.. 1 By Mouth For Migraine, Every 6 Hours As Needed 12)  Nortriptyline Hcl 10 Mg Caps (Nortriptyline Hcl) .... Take 3 Tablets At Bedtime- Prescribed By Dr. Andrey Campanile Neuro 13)  Simvastatin 40 Mg Tabs (Simvastatin) .Marland Kitchen.. 1 By Mouth Daily For Cholesterol 14)  Sarna 0.5-0.5 % Lotn (Camphor-Menthol) .... Apply To Skin Twice A Day As Needed Itching 15)  Vistaril 50 Mg Caps (Hydroxyzine Pamoate) .... Take 1.5 Tab Every 6 Hours As Needed For Itching  Allergies (verified): 1)  ! Sulfa 2)  ! Biaxin 3)  ! Penicillin   Physical Exam  General:  NAD, Vital signs noted Scratching all over  Mouth:  No oral lesions Msk:  Left Knee: Normal to inspection with no erythema or effusion or obvious bony abnormalities. Palpation -tenderness along inferior joint line and popliteal region  ROM normal in flexion and extension and lower leg rotation. Ligaments with solid consistent endpoints including ACL, PCL, LCL, MCL. Negative  lachman and provocative meniscal tests. Non painful patellar compression. Lower ext  strength is normal. 5/5 bilat Slight antalgic gait/limp  Skin:  multiple macularpapular lesions on erythematous base on left wrist, back, abdomen, 2 noted on legs most now scabs, + excoriations, NT to palpation, soles/palms spared, no facial lesions   Impression & Recommendations:  Problem # 1:  HEADACHE (ICD-784.0) Assessment Unchanged Pt has been on multiple medications and it is seeing multiple providers, will discuss with Neurologist before changning any medication. Noted after discussion with Dr.Kane possibility of Depakote, as Lamictal gives daily headache. In meantime pt to follow-up with [facility], she has exhausted other resources here Her updated medication list for this problem includes:    Vicodin 5-500 Mg Tabs (Hydrocodone-acetaminophen) .Marland Kitchen... 1 by mouth for migraine, every 6 hours as needed  Orders: Ketorolac-Toradol 15mg  (E4540) Promethazine up to 50mg  (J2550) [facility]- Est  Level 4 (98119)  Problem # 2:  KNEE PAIN, RIGHT, ACUTE (ICD-719.46) Assessment: New  No evidence of mensical tear or discolcation, will provide symptomatic relief ace, ice, NSAID, if no improvement consider SM or PT. No imaging needed today Her updated medication list for this problem includes:    Vicodin 5-500 Mg Tabs (Hydrocodone-acetaminophen) .Marland Kitchen... 1 by mouth for migraine, every 6 hours as needed  Orders: [facility]- Est  Level 4 ([address]782)  Problem # 3:  SKIN RASH (ICD-782.1) Assessment: New  Pt self treated for Bed bugs/scabies. Will now treat symtpoms, increase Vistaril dose and add sarna lotion, lesions not superinfected, expect resolution in 1 week  Her updated medication list for this problem includes:    Sarna 0.5-0.5 % Lotn (Camphor-menthol) .Marland Kitchen... Apply to skin twice a day as needed itching  Orders: [facility]- Est  Level 4 (99214)  Complete Medication List: 1)  Albuterol 90 Mcg/act Aers (Albuterol) .... Inhale 2 puffs  every 4 hours if needed for short of breath 2)  Lamictal 150 Mg Tabs (Lamotrigine) .Marland Kitchen.. 1 tablet by mouth bid 3)  Miralax Powd (Polyethylene glycol 3350) .Marland Kitchen.. 17g by mouth 1-3 times daily as needed constipation disp largest bottle 4)  Prilosec 40 Mg Cpdr (Omeprazole) .... Take one 30-60 min before first and last meals of the day 5)  Spiriva Handihaler 18 Mcg Caps (Tiotropium bromide monohydrate) .... Inhale 1  capsule daily, disp 1 handihaler 6)  Topamax 100 Mg Tabs (Topiramate) .Marland Kitchen.. 1 tablet two times a day for headache prevention 7)  Lactulose 10 Gm/44ml Soln (Lactulose) .... Take 30-96ml as needed for soft bowel movements.  disp 8)  Dicyclomine Hcl 20 Mg Tabs (Dicyclomine hcl) .Marland Kitchen.. 1 by mouth one hour before meals and at bedtime 9)  Vitamin D3 2000 Unit Caps (Cholecalciferol) .Marland Kitchen.. 1 by mouth daily 10)  Flonase 50 Mcg/act Susp (Fluticasone propionate) .... 2 sprays each nostril daily disp: one inhaler 11)  Vicodin 5-500 Mg Tabs (Hydrocodone-acetaminophen) .Marland Kitchen.. 1 by mouth for migraine, every 6 hours as needed 12)  Nortriptyline Hcl 10 Mg Caps (Nortriptyline hcl) .... Take 3 tablets at bedtime- prescribed by dr. Andrey Campanile neuro 13)  Simvastatin 40 Mg Tabs (Simvastatin) .Marland Kitchen.. 1 by mouth daily for cholesterol 14)  Sarna 0.5-0.5 % Lotn (Camphor-menthol) .... Apply to skin twice a day as needed itching 15)  Vistaril 50 Mg Caps (Hydroxyzine pamoate) .... Take 1.5 tab every 6 hours as needed for itching  Patient Instructions: 1)  For your headaches you can continue the Vicodin, the goal is to get you off of daily pain killers as this is not the best treatment 2)  Please follow-up with [facility] 3)  For your knee, pick up a knee brace, you can use the vicodin or Ibuprofen as needed- if it does not improve please return 4)  For itching, try Sarna Lotion and the higher dose of Vistaril 5)  I will discuss with your Neurologist the other medications 6)  Please schedule a Complete  Physical Prescriptions: VICODIN 5-500 MG TABS (HYDROCODONE-ACETAMINOPHEN) 1 by mouth for migraine, every 6 hours as needed  #40 x 0   Entered and Authorized by:   Milinda Antis MD   Signed by:   Milinda Antis MD on 12/07/2009   Method used:   Handwritten   RxID:   [id number] VISTARIL 50 MG CAPS (HYDROXYZINE PAMOATE) Take 1.5 tab every 6 hours as needed for itching  #60 x 1   Entered and Authorized by:   Milinda Antis MD   Signed by:   Milinda Antis MD on 12/07/2009   Method used:   Electronically to        [facility] [address] [phone]* (retail)       [address]       [place], [place]  [address]       Ph: [phone] or [phone]       Fax: [phone]   RxID:   [id number] SARNA 0.5-0.5 % LOTN (CAMPHOR-MENTHOL) apply to skin twice a day as needed itching  #1 x 3   Entered and Authorized by:   Milinda Antis MD   Signed by:   Milinda Antis MD on 12/07/2009   Method used:   Electronically to        [facility] [address] [phone]* (retail)       [address]       [place], [place]  [address]       Ph: [phone] or [phone]       Fax: [phone]   RxID:   [id number]    Medication Administration  Injection # 1:    Medication: Ketorolac-Toradol 15mg     Diagnosis: HEADACHE (ICD-784.0)    Route: IM    Site: R deltoid    Exp Date: 08/12/2011    Lot #: [id number]    Mfr: [email or web address]    Comments: 30mg /67ml given    Patient tolerated injection without complications    Given by: Tessie Fass CMA (December 07, 2009 9:48 AM)  Injection # 2:    Medication: Promethazine up to 50mg     Diagnosis: HEADACHE (ICD-784.0)    Route: IM    Site: L deltoid    Exp Date: 07/2011    Lot #: [id number]    Mfr: [email or web address]    Comments: 12.5mg /0.68ml given    Patient tolerated injection  without complications    Given by: Tessie Fass CMA (December 07, 2009 9:54 AM)  Orders Added: 1)  Ketorolac-Toradol 15mg  [J1885] 2)  Promethazine up  to 50mg  [J2550] 3)  [facility]- Est  Level 4 [[address]]

## 2010-09-10 NOTE — Assessment & Plan Note (Signed)
Summary: f/u migrains,df   Vital Signs:  Patient profile:   55 year old female Height:      60.25 inches Weight:      187 pounds BMI:     36.35 Temp:     98.1 degrees F oral Pulse rate:   77 / minute BP sitting:   120 / 79  (left arm) BP standing:   104 / 72  Vitals Entered By: Starleen Arms CMA (March 27, 2010 3:33 PM)  Serial Vital Signs/Assessments:  Time      Position  BP       Pulse  Resp  Temp     By 4:14 PM   Lying RA  104/60                         Tamika Yarborough CMA 4:15      Sitting   102/70                         Tamika Yarborough CMA 4:14 PM   Standing  104/72                         Tamika Yarborough CMA  CC: f/u migraine Is Patient Diabetic? Yes Did you bring your meter with you today? No Pain Assessment Patient in pain? yes     Location: head Intensity: 8 Type: aching Nutritional Status BMI of > 30 = obese  Does patient need assistance? Functional Status Self care Ambulation Normal   Primary Care Provider:  Milinda Antis MD  CC:  f/u migraine.  History of Present Illness:  Last Tuesday fell after walking though the house, had been having weak/fatigue  spells prior to, headaches, unable to walk on Friday before she fell. Fell over a pillow and hit a wagon, hit the left side of head, since then still  has soreness. and now with a migraine for past few days not responsive to meds  Pt noted for the past few weeks has been slurring her speech No change in medications recently, no fever, no cough  Still feels legs are weaker than normal  Has not seen the [facility] doctor for her Headaches, calling for transportation   Rash on fingers x 2 months, feel like it spreading- startum on chest now on hands Used diabetic cream on hands- which helped the itching but the rash burned  Allergies: 1)  ! Sulfa 2)  ! Biaxin 3)  ! Penicillin 4)  ! Advair Diskus (Fluticasone-Salmeterol)  Physical Exam  General:  NAD, Vital signs noted   Eyes:   EOMI.  Perrla. Funduscopic exam benign, without hemorrhages, exudates or papilledema.   Neck:  supple  Heart:  RRR, no murmur Pulses:  pulses 2+ Neurologic:  alert & oriented X3, cranial nerves II-XII intact, and strength normal in all extremities.   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 Will obtain MRI of brain with increase in headaches, weakness, falls, check lytes, TSH, Vit D Orders: MRI with Contrast (MRI w/Contrast) [facility]- Est  Level 4 (60454)  Problem # 2:  SKIN RASH (ICD-782.1) Assessment: New KOH neg, treat for hand dermatitis The following medications were removed from the medication list:    Sarna 0.5-0.5 % Lotn (Camphor-menthol) .Marland Kitchen... Apply to skin twice a day as needed itching Her updated medication list for this problem includes:    Clobetasol Propionate 0.05 % Crea (Clobetasol propionate) .Marland Kitchen... Apply to rash on hands twice a day x 10 days  Orders: KOH-[facility] ([address]) [facility]- Est  Level 4 (99214)  Problem # 3:  MIGRAINE, UNSPEC., W/O INTRACTABLE MIGRAINE (ICD-346.90) Assessment: Unchanged Given abortive tx, refilled vicodin, although this is not the best treatment she has been on multiple medications without any change, and has not had transporation to [facility] headache center Her updated medication list for this problem includes:    Vicodin 5-500 Mg Tabs (Hydrocodone-acetaminophen) .Marland Kitchen... 1 by mouth for migraine, every 6 hours as needed  Orders: Promethazine up to 50mg  (J2550) Admin of Therapeutic Inj  intramuscular or subcutaneous (60454) Admin of Therapeutic Inj  intramuscular or subcutaneous (09811) Admin of Therapeutic Inj   intramuscular or subcutaneous (91478) [facility]- Est  Level 4 ([address]562)  Complete Medication List: 1)  Albuterol 90 Mcg/act Aers (Albuterol) .... Inhale 2 puffs every 4 hours if needed for short of breath 2)  Lamictal 150 Mg Tabs (Lamotrigine) .Marland Kitchen.. 1 tablet by mouth bid 3)  Miralax Powd (Polyethylene glycol 3350) .Marland Kitchen.. 17g by mouth 1-3 times daily as needed constipation disp largest bottle 4)  Prilosec 40 Mg Cpdr (Omeprazole) .... Take one 30-60 min before first and last meals of the day 5)  Spiriva Handihaler 18 Mcg Caps (Tiotropium bromide monohydrate) .... Inhale 1  capsule daily, disp 1 handihaler 6)  Topamax 100 Mg Tabs (Topiramate) .Marland Kitchen.. 1 tablet two times a day for headache prevention 7)  Lactulose 10 Gm/60ml Soln (Lactulose) .... Take 30-60ml as needed for soft bowel movements.  disp 8)  Dicyclomine Hcl 20 Mg Tabs (Dicyclomine hcl) .Marland Kitchen.. 1 by mouth one hour before meals and at bedtime 9)  Vitamin D3 2000 Unit Caps (Cholecalciferol) .Marland Kitchen.. 1 by mouth daily 10)  Flonase 50 Mcg/act Susp (Fluticasone propionate) .... 2 sprays each nostril daily disp: one inhaler 11)  Vicodin 5-500 Mg Tabs (Hydrocodone-acetaminophen) .Marland Kitchen.. 1 by mouth for migraine, every 6 hours as needed 12)  Simvastatin 40 Mg Tabs (Simvastatin) .Marland Kitchen.. 1 by mouth daily for cholesterol 13)  Claritin 10 Mg Caps (Loratadine) .Marland Kitchen.. 1 by mouth daily as needed allergies 14)  Clobetasol Propionate 0.05 % Crea (Clobetasol propionate) .... Apply to rash on hands twice a day x 10 days 15)  Valium 5 Mg Tabs (Diazepam) .... Take 1 tab prior to procedure may repeat 6 hours later  Other Orders: Comp Met-[facility] [phone]) CBC-[facility] (96295) TSH-[facility] [id number]) Vit D, 25 OH-[facility] ([address]25-[address]) Ketorolac-Toradol 15mg  (I3474)  Patient Instructions: 1)  I will call you with MRI results 2)  I will call you with lab results 3)  Follow-up for your well woman exam 4)  I will tell you when to return after the results for your  weakness Prescriptions: VALIUM 5 MG TABS (DIAZEPAM) Take 1 tab prior to procedure may repeat 6 hours later  #4 x 0   Entered and Authorized by:   Milinda Antis MD   Signed by:   Terese Door on 03/27/2010   Method used:   Handwritten   RxID:   [id number] VICODIN 5-500 MG TABS (HYDROCODONE-ACETAMINOPHEN) 1 by mouth for migraine, every 6 hours as needed  #30 x 0   Entered and Authorized  by:   Milinda Antis MD   Signed by:   Milinda Antis MD on 03/27/2010   Method used:   Handwritten   RxID:   [id number] SPIRIVA HANDIHALER 18 MCG  CAPS (TIOTROPIUM BROMIDE MONOHYDRATE) inhale 1  capsule daily, disp 1 handihaler  #1 x 6   Entered and Authorized by:   Milinda Antis MD   Signed by:   Milinda Antis MD on 03/27/2010   Method used:   [facility] to        [facility] [address] [phone]* (retail)       [address]       [place], [place]  [address]       Ph: [phone] or [phone]       Fax: [phone]   RxID:   [id number] PRILOSEC 40 MG CPDR (OMEPRAZOLE) Take one 30-60 min before first and last meals of the day  #60 Capsule x 6   Entered and Authorized by:   Milinda Antis MD   Signed by:   Milinda Antis MD on 03/27/2010   Method used:   Electronically to        [facility] [address] [phone]* (retail)       [address]       [place], [place]  [address]       Ph: [phone] or [phone]       Fax: [phone]   RxID:   [id number] ALBUTEROL 90 MCG/ACT AERS (ALBUTEROL) inhale 2 puffs every 4 hours if needed for short of breath  #1 x 6   Entered and Authorized by:   Milinda Antis MD   Signed by:   Milinda Antis MD on 03/27/2010   Method used:   Electronically to        [facility] [address] [phone]* (retail)       [address]       [place], [place]  [address]       Ph: [phone] or [phone]       Fax: [phone]   RxID:   [id number] CLOBETASOL PROPIONATE 0.05 % CREA  (CLOBETASOL PROPIONATE) apply to rash on hands twice a day x 10 days  #1 x 0   Entered and Authorized by:   Milinda Antis MD   Signed by:   Milinda Antis MD on 03/27/2010   Method used:   Electronically to        [facility] [address] [phone]* (retail)       [address]       [place], [place]  [address]       Ph: [phone] or [phone]       Fax: [phone]   RxID:   [id number]    Medication Administration  Injection # 1:    Medication: Ketorolac-Toradol 15mg     Diagnosis: MIGRAINE, UNSPEC., W/O INTRACTABLE MIGRAINE (ICD-346.90)    Route: IM    Site: LUOQ gluteus    Exp Date: 09/2011    Lot #: [id number]    Mfr: Aleene Davidson    Comments: Gave 30 ml of toradol    Patient tolerated injection without complications    Given by: Starleen Arms CMA (March 27, 2010 4:04 PM)  Injection # 2:    Medication: Promethazine up to 50mg     Diagnosis: MIGRAINE, UNSPEC., W/O INTRACTABLE  MIGRAINE (ICD-346.90)    Route: IM    Site: RUOQ gluteus    Exp Date: 10/2011    Lot #: [id number]    Mfr: [email or web address]    Patient tolerated injection without complications    Given by: Starleen Arms CMA (March 27, 2010 4:05 PM)  Orders Added: 1)  Comp Met-[facility] [[id number]] 2)  CBC-[facility] [85027] 3)  TSH-[facility] [78469-62952] 4)  Vit D, 25 OH-[facility] [84132-44010] 5)  KOH-[facility] [87220] 6)  Ketorolac-Toradol 15mg  [J1885] 7)  Promethazine up to 50mg  [J2550] 8)  Admin of Therapeutic Inj  intramuscular or subcutaneous [96372] 9)  Admin of Therapeutic Inj  intramuscular or subcutaneous [96372] 10)  Admin of Therapeutic Inj  intramuscular or subcutaneous [96372] 11)  MRI with Contrast [MRI w/Contrast] 12)  [facility]- Est  Level 4 [27253]   Laboratory Results  Date/Time Received: March 27, 2010 3:55 PM  Date/Time Reported: March 27, 2010 4:21 PM   Other Tests  Skin KOH: Negative Comments: ...........test performed by...........Marland KitchenTerese Door, CMA

## 2010-09-10 NOTE — Progress Notes (Signed)
Summary: RE: Vicodin/TS  called pt and informed of Dr.[place]'s note. pt agreed.Arlyss Repress CMA,  February 28, 2010 9:32 AM  Phone Note Refill Request Call back at [facility] Phone [phone] Message from:  Patient  Refills Requested: Medication #1:  VICODIN 5-500 MG TABS 1 by mouth for migraine [facility]  Initial call taken by: Clydell Hakim,  February 27, 2010 2:35 PM  Follow-up for Phone Call        Please let pt know, i have refilled her Vicodin, but I need her to make an appt with [facility] to follow-up her headaches as opiates are not the best medicine for her headaches Follow-up by: Milinda Antis MD,  February 27, 2010 5:42 PM    Prescriptions: VICODIN 5-500 MG TABS (HYDROCODONE-ACETAMINOPHEN) 1 by mouth for migraine, every 6 hours as needed  #30 x 0   Entered and Authorized by:   Milinda Antis MD   Signed by:   Milinda Antis MD on 02/27/2010   Method used:   Telephoned to ...       [facility] [address] [phone]* (retail)       [address]       [place], [place]  [address]       Ph: [phone] or [phone]       Fax: [phone]   RxID:   [id number]

## 2010-09-10 NOTE — Progress Notes (Signed)
Summary: neuro appt   Phone Note Outgoing Call   Call placed by: Milinda Antis MD,  June 21, 2010 5:38 PM Details for Reason: neuro appt Summary of Call: Spoke witth pt, seen by Neuro, blood work taken, referred to PT for weakness, and started on prednisone pak. Will await full note from neurologist

## 2010-09-10 NOTE — Assessment & Plan Note (Signed)
Summary: f/up COPD,tcb   Vital Signs:  Patient profile:   55 year old female Weight:      192.0 pounds Temp:     98.4 degrees F oral Pulse rate:   84 / minute BP sitting:   104 / 70  Vitals Entered By: Loralee Pacas CMA (October 17, 2009 3:40 PM)  Primary Care Provider:  Milinda Antis MD   History of Present Illness: 55 yo patient of Dr. Deirdre Peer here to follow-up on COPD  COPD:  was seen 1 week ago and was prescribed prednison and avelox for outpatietn treatment of exacerbation.  Patient has completed course of abx.  Continued to have some productive sputum but coughing less, no sig SOB, no fever, no wheezing.  Uses albuterol every 4 hours.  Depression:  Patient says she recently saw her neurologist- Dr. Andrey Campanile and described her feeling of sadness and was prescribed nortryptiline 30 mg at bedtime.  She does not feel that it has helped much.  She ntoes she has had increased sadness as her sister is in the hospital now and it is the anniversary of her mother's death.  She denies SI, HI.   She requests medication change and feels counseling would be beneficial.  Allergies: 1)  ! Sulfa 2)  ! Biaxin 3)  ! Penicillin  Review of Systems      See HPI ENT:  Complains of nasal congestion; denies sinus pressure. Resp:  Complains of cough and sputum productive; denies chest discomfort and wheezing. Psych:  Complains of depression; denies suicidal thoughts/plans and unusual visions or sounds.  Physical Exam  General:  Appeared much older than age, smelled of smoke, did not appear to be in distress. Vital signs noted.  Tearful when talking about loss in her life.  Lungs:  no increased work of breathing.  CTAB.  no wheezes.  Good air mvt throughout Heart:  Normal rate and regular rhythm. S1 and S2 normal without gallop, murmur, click, rub or other extra sounds. Psych:  Oriented X3, depressed affect, and subdued.     Impression & Recommendations:  Problem # 1:  COPD (ICD-496)  Seems to  be resolved from recent exacerbation.  Expect continued improvement of cough.  Mentioned smoking sessation btu not ready at this time.  Follow up as needed with PCP.  Given red flags for return.  Her updated medication list for this problem includes:    Albuterol 90 Mcg/act Aers (Albuterol) ..... Inhale 2 puffs every 4 hours if needed for short of breath    Spiriva Handihaler 18 Mcg Caps (Tiotropium bromide monohydrate) ..... Inhale 1  capsule daily, disp 1 handihaler  Orders: [facility]- Est Level  3 (09811)  Problem # 2:  GRIEF REACTION, ACUTE (ICD-309.0)  Increased symptoms in setting of anniversary of her mother's death.  Needs further evaluation on if this represents major depressive DO but seems to be fairly acute at this time.  gave number for Dr. [name] as pt feels it would be beneficial to talk to someone about her feelings of loss and how to cope.  Asked pt to make follow-up with PCP for further evaluation and med mgt.  Orders: [facility]- Est Level  3 (91478)  Complete Medication List: 1)  Albuterol 90 Mcg/act Aers (Albuterol) .... Inhale 2 puffs every 4 hours if needed for short of breath 2)  Lamictal 150 Mg Tabs (Lamotrigine) .Marland Kitchen.. 1 tablet by mouth bid 3)  Miralax Powd (Polyethylene glycol 3350) .Marland Kitchen.. 17g by mouth 1-3 times daily  as needed constipation disp largest bottle 4)  Prilosec 40 Mg Cpdr (Omeprazole) .... Take one 30-60 min before first and last meals of the day 5)  Spiriva Handihaler 18 Mcg Caps (Tiotropium bromide monohydrate) .... Inhale 1  capsule daily, disp 1 handihaler 6)  Topamax 100 Mg Tabs (Topiramate) .Marland Kitchen.. 1 tablet two times a day for headache prevention 7)  Lactulose 10 Gm/43ml Soln (Lactulose) .... Take 30-64ml as needed for soft bowel movements.  disp 8)  Dicyclomine Hcl 20 Mg Tabs (Dicyclomine hcl) .Marland Kitchen.. 1 by mouth one hour before meals and at bedtime 9)  Vitamin D3 2000 Unit Caps (Cholecalciferol) .Marland Kitchen.. 1 by mouth daily 10)  Flonase 50 Mcg/act Susp (Fluticasone  propionate) .... 2 sprays each nostril daily disp: one inhaler 11)  Simvastatin 40 Mg Tabs (Simvastatin) .Marland Kitchen.. 1 by mouth daily 12)  Lipitor 40 Mg Tabs (Atorvastatin calcium) .Marland Kitchen.. 1 by mouth daily 13)  Bacitracin 500 Unit/gm Oint (Bacitracin) .... Apply to affected aerea on belly button two times a day x 7 days 14)  Vicodin 5-500 Mg Tabs (Hydrocodone-acetaminophen) .Marland Kitchen.. 1 by mouth for migraine, every 6 hours as needed 15)  Prednisone 20 Mg Tabs (Prednisone) .... Take 40mg  by mouth daily x 5 days  qs 16)  Avelox 400 Mg Tabs (Moxifloxacin hcl) .Marland Kitchen.. 1 by mouth daily x 5 days 17)  Hydroxyzine Hcl 25 Mg Tabs (Hydroxyzine hcl) .Marland Kitchen.. 1 by mouth q 6hrs as needed itching, do not drink alcohol with this 18)  Nortriptyline Hcl 10 Mg Caps (Nortriptyline hcl) .... Take 3 tablets at bedtime- prescribed by dr. Andrey Campanile neuro  Patient Instructions: 1)  return for recheck if you notice increasing SOB, having to use your inhaler more than every 3-4 hours, fever.  I expect your cough to get better over the next few weeks. 2)  Dr. [name] - see business card to make appt 3)  follow-up with Dr. Jeanice Lim at next available to talk more about changes to yoru medications for depression

## 2010-09-10 NOTE — Miscellaneous (Signed)
Summary: Orders Update   Clinical Lists Changes  Problems: Added new problem of POSTMENOPAUSAL STATUS (ICD-V49.81) Orders: Added new Test order of Dexa scan (Dexa scan) - Signed Added new Referral order of Dermatology Referral (Derma) - Signed Observations: Added new observation of MAMMO DUE: 11/22/2010 (06/04/2010 22:28)     Last Mammogram:  ASSESSMENT: Negative - BI-RADS 1^MM DIGITAL SCREENING (11/21/2009 3:25:00 PM) Mammogram Next Due:  1 yr

## 2010-09-10 NOTE — Assessment & Plan Note (Signed)
Summary: COPD, anxiety   Vital Signs:  Patient profile:   55 year old female Height:      60.25 inches Weight:      184 pounds BMI:     35.77 Temp:     98.4 degrees F oral Pulse rate:   85 / minute BP sitting:   90 / 61  (left arm) Cuff size:   large  Vitals Entered By: Tessie Fass CMA (February 13, 2010 9:32 AM) CC: cough and weezing , left eye pain, anxiety Pain Assessment Patient in pain? yes     Location: head Intensity: 8   Primary Care Provider:  Milinda Antis MD  CC:  cough and weezing , left eye pain, and anxiety.  History of Present Illness:  Headache- would like abortive treatment today,no change in headache pattern  Cough- cough non productive with wheezing x 1 week, using inhaler q 4hrs and used nebulizer twice this week, also taking spiriva. Denies fever, continues to smoke 1 ppd, +SOB  Left eye pain- awoke this AM with blurry vision and feeling something was in her eye, denies discharge, in eye, feels irritated  Anxiety- husband to have cardiac cath with stenting and carotid work-up in 2 weeks, she has been worrried because of persistant chest pain, very anxious about upcoming procedure, she does not like to be along, does not drive, relies on husband, high anxiety right now, asking for something to calm her nerves  Habits & Providers  Alcohol-Tobacco-Diet     Tobacco Status: current     Tobacco Counseling: to quit use of tobacco products     Cigarette Packs/Day: 1.0  Current Medications (verified): 1)  Albuterol 90 Mcg/act Aers (Albuterol) .... Inhale 2 Puffs Every 4 Hours If Needed For Short of Breath 2)  Lamictal 150 Mg  Tabs (Lamotrigine) .Marland Kitchen.. 1 Tablet By Mouth Bid 3)  Miralax   Powd (Polyethylene Glycol 3350) .Marland Kitchen.. 17g By Mouth 1-3 Times Daily As Needed Constipation Disp Largest Bottle 4)  Prilosec 40 Mg Cpdr (Omeprazole) .... Take One 30-60 Min Before First and Last Meals of The Day 5)  Spiriva Handihaler 18 Mcg  Caps (Tiotropium Bromide  Monohydrate) .... Inhale 1  Capsule Daily, Disp 1 Handihaler 6)  Topamax 100 Mg  Tabs (Topiramate) .Marland Kitchen.. 1 Tablet Two Times A Day For Headache Prevention 7)  Lactulose 10 Gm/64ml  Soln (Lactulose) .... Take 30-3ml As Needed For Soft Bowel Movements.  Disp 8)  Dicyclomine Hcl 20 Mg  Tabs (Dicyclomine Hcl) .Marland Kitchen.. 1 By Mouth One Hour Before Meals and At Bedtime 9)  Vitamin D3 2000 Unit Caps (Cholecalciferol) .Marland Kitchen.. 1 By Mouth Daily 10)  Flonase 50 Mcg/act Susp (Fluticasone Propionate) .... 2 Sprays Each Nostril Daily Disp: One Inhaler 11)  Vicodin 5-500 Mg Tabs (Hydrocodone-Acetaminophen) .Marland Kitchen.. 1 By Mouth For Migraine, Every 6 Hours As Needed 12)  Nortriptyline Hcl 10 Mg Caps (Nortriptyline Hcl) .... Take 3 Tablets At Bedtime- Prescribed By Dr. Andrey Campanile Neuro 13)  Simvastatin 40 Mg Tabs (Simvastatin) .Marland Kitchen.. 1 By Mouth Daily For Cholesterol 14)  Sarna 0.5-0.5 % Lotn (Camphor-Menthol) .... Apply To Skin Twice A Day As Needed Itching 15)  Claritin 10 Mg Caps (Loratadine) .Marland Kitchen.. 1 By Mouth Daily As Needed Allergies 16)  Avelox 400 Mg Tabs (Moxifloxacin Hcl) .Marland Kitchen.. 1 By Mouth Daily X 5 Days 17)  Prednisone 20 Mg Tabs (Prednisone) .... Take 2 Tabs By Mouth Daily X 7 Days 18)  Xanax 0.5 Mg Tabs (Alprazolam) .Marland Kitchen.. 1 By Mouth Three  Times A Day As Needed Anxiety  Allergies: 1)  ! Sulfa 2)  ! Biaxin 3)  ! Penicillin 4)  ! Advair Diskus (Fluticasone-Salmeterol)  Past History:  Past Surgical History: BTL 12/85 - Partial hysterectomy L knee arthroscopy - 04/11/2002,            Physical Exam  General:  NAD, Vital signs noted   Eyes:  No corneal or conjunctival inflammation noted. EOMI. Perrla. Funduscopic exam benign, without hemorrhages, exudates or papilledema.  Flourescein- negative Mouth:  MMM, oropharynx clear Lungs:  bilat. expiratory wheeze, slight increase WOB +bronchospasm, oxygen sat 99% RA no rales equal air movement- decreased at bases Heart:  RRR, no murmur   Impression &  Recommendations:  Problem # 1:  COPD (ICD-496)   Treat for exacerbation, given neb in office, prednisone x 7 days, Doxycycline x 7 days Her updated medication list for this problem includes:    Albuterol 90 Mcg/act Aers (Albuterol) ..... Inhale 2 puffs every 4 hours if needed for short of breath    Spiriva Handihaler 18 Mcg Caps (Tiotropium bromide monohydrate) ..... Inhale 1  capsule daily, disp 1 handihaler  Orders: Albuterol Sulfate Sol 1mg  unit dose (Z6109) Atrovent 1mg  (Neb) (J7644) [facility]- Est  Level 4 (60454)  Problem # 2:  HEADACHE (ICD-784.0) Assessment: Unchanged  Her updated medication list for this problem includes:    Vicodin 5-500 Mg Tabs (Hydrocodone-acetaminophen) .Marland Kitchen... 1 by mouth for migraine, every 6 hours as needed  Orders: [facility]- Est  Level 4 (99214)  Problem # 3:  EYE PAIN, LEFT (ICD-379.91) Assessment: New  neg flourescine stain, neg exam, reassurance, drops to eye for irritation prn  Orders: [facility]- Est  Level 4 (09811)  Problem # 4:  ANXIETY (ICD-300.00) Assessment: New  pt very anxious about husbands upcoming surgery given short term course of xanax The following medications were removed from the medication list:    Vistaril 50 Mg Caps (Hydroxyzine pamoate) .Marland Kitchen... Take 1.5 tab every 6 hours as needed for itching Her updated medication list for this problem includes:    Nortriptyline Hcl 10 Mg Caps (Nortriptyline hcl) .Marland Kitchen... Take 3 tablets at bedtime- prescribed by dr. Andrey Campanile neuro    Xanax 0.5 Mg Tabs (Alprazolam) .Marland Kitchen... 1 by mouth three times a day as needed anxiety  Orders: [facility]- Est  Level 4 (99214)  Complete Medication List: 1)  Albuterol 90 Mcg/act Aers (Albuterol) .... Inhale 2 puffs every 4 hours if needed for short of breath 2)  Lamictal 150 Mg Tabs (Lamotrigine) .Marland Kitchen.. 1 tablet by mouth bid 3)  Miralax Powd (Polyethylene glycol 3350) .Marland Kitchen.. 17g by mouth 1-3 times daily as needed constipation disp largest bottle 4)  Prilosec 40 Mg Cpdr (Omeprazole)  .... Take one 30-60 min before first and last meals of the day 5)  Spiriva Handihaler 18 Mcg Caps (Tiotropium bromide monohydrate) .... Inhale 1  capsule daily, disp 1 handihaler 6)  Topamax 100 Mg Tabs (Topiramate) .Marland Kitchen.. 1 tablet two times a day for headache prevention 7)  Lactulose 10 Gm/86ml Soln (Lactulose) .... Take 30-40ml as needed for soft bowel movements.  disp 8)  Dicyclomine Hcl 20 Mg Tabs (Dicyclomine hcl) .Marland Kitchen.. 1 by mouth one hour before meals and at bedtime 9)  Vitamin D3 2000 Unit Caps (Cholecalciferol) .Marland Kitchen.. 1 by mouth daily 10)  Flonase 50 Mcg/act Susp (Fluticasone propionate) .... 2 sprays each nostril daily disp: one inhaler 11)  Vicodin 5-500 Mg Tabs (Hydrocodone-acetaminophen) .Marland Kitchen.. 1 by mouth for migraine, every 6 hours as  needed 12)  Nortriptyline Hcl 10 Mg Caps (Nortriptyline hcl) .... Take 3 tablets at bedtime- prescribed by dr. Andrey Campanile neuro 13)  Simvastatin 40 Mg Tabs (Simvastatin) .Marland Kitchen.. 1 by mouth daily for cholesterol 14)  Sarna 0.5-0.5 % Lotn (Camphor-menthol) .... Apply to skin twice a day as needed itching 15)  Claritin 10 Mg Caps (Loratadine) .Marland Kitchen.. 1 by mouth daily as needed allergies 16)  Avelox 400 Mg Tabs (Moxifloxacin hcl) .Marland Kitchen.. 1 by mouth daily x 5 days 17)  Prednisone 20 Mg Tabs (Prednisone) .... Take 2 tabs by mouth daily x 7 days 18)  Xanax 0.5 Mg Tabs (Alprazolam) .Marland Kitchen.. 1 by mouth three times a day as needed anxiety  Other Orders: A1C-[facility] (16109) Lipid-[facility] (60454-09811) Ketorolac-Toradol 15mg  (B1478) Promethazine up to 50mg  (G9562)  Patient Instructions: 1)  We need to schedule a CPP with PAP Smear 2)  For your COPD I have sent Avelox and prednisone 3)  Continue to use your nebulizer every 4-6 hours as needed 4)  Use your inhaler every 4 hours  5)  Go to ER if your breathing does not improve 6)  Use eye drops for irriation to your eyes Prescriptions: XANAX 0.5 MG TABS (ALPRAZOLAM) 1 by mouth three times a day as needed anxiety  #30 x 0   Entered  and Authorized by:   Milinda Antis MD   Signed by:   Milinda Antis MD on 02/13/2010   Method used:   Telephoned to ...       [facility] [address] [phone]* (retail)       [address]       [place], [place]  [address]       Ph: [phone] or [phone]       Fax: [phone]   RxID:   [id number] PREDNISONE 20 MG TABS (PREDNISONE) Take 2 tabs by mouth daily x 7 days  #14 x 0   Entered and Authorized by:   Milinda Antis MD   Signed by:   Milinda Antis MD on 02/13/2010   Method used:   [facility]ronically to        [facility] [address] [phone]* (retail)       [address]       [place], [place]  [address]       Ph: [phone] or [phone]       Fax: [phone]   RxID:   [id number] AVELOX 400 MG TABS (MOXIFLOXACIN HCL) 1 by mouth daily x 5 days  #5 x 0   Entered and Authorized by:   Milinda Antis MD   Signed by:   Milinda Antis MD on 02/13/2010   Method used:   [facility] to        [facility] [address] [phone]* (retail)       [address]       [place], [place]  [address]       Ph: [phone] or [phone]       Fax: [phone]   RxID:   [id number]   Laboratory Results   Blood Tests   Date/Time Received: February 13, 2010 9:21 AM  Date/Time Reported: February 13, 2010 9:46 AM   HGBA1C: 5.4%   (Normal Range: Non-Diabetic - 3-6%   Control Diabetic - 6-8%)  Comments: ...............test performed by......Marland KitchenBonnie A. [place], MLS (ASCP)cm      Prevention &  Chronic Care Immunizations   Influenza vaccine: Fluvax Non-MCR  (05/22/2009)   Influenza vaccine due: 05/01/2009    Tetanus booster: 12/09/2001: Done.   Tetanus booster due: 12/10/2011    Pneumococcal vaccine: Not documented  Colorectal Screening   Hemoccult: Not documented   Hemoccult due: Not Indicated    Colonoscopy: Done.  (05/11/2006)   Colonoscopy due: 05/11/2016  Other Screening   Pap smear: normal   (11/10/2006)   Pap smear due: 11/10/2007    Mammogram: ASSESSMENT: Negative - BI-RADS 1^MM DIGITAL SCREENING  (11/21/2009)   Mammogram due: 02/01/2009   Smoking status: current  (02/13/2010)   Smoking cessation counseling: yes  (08/17/2008)  Diabetes Mellitus   HgbA1C: 5.4  (02/13/2010)   Hemoglobin A1C due: 05/27/2008    Eye exam: Not documented    Foot exam: Not documented   High risk foot: Not documented   Foot care education: Not documented    Urine microalbumin/creatinine ratio: Not documented  Lipids   Total Cholesterol: 203  (01/26/2008)   LDL: 113  (01/26/2008)   LDL Direct: 56  (03/29/2008)   HDL: 47  (01/26/2008)   Triglycerides: 215  (01/26/2008)    SGOT (AST): 11  (08/27/2009)   SGPT (ALT): 9  (08/27/2009)   Alkaline phosphatase: 117  (08/27/2009)   Total bilirubin: 0.2  (08/27/2009)  Self-Management Support :    Diabetes self-management support: Not documented    Lipid self-management support: Not documented    Medication Administration  Injection # 1:    Medication: Ketorolac-Toradol 15mg     Diagnosis: MIGRAINE, UNSPEC., W/O INTRACTABLE MIGRAINE (ICD-346.90)    Route: IM    Site: RUOQ gluteus    Exp Date: 10/10/2011    Lot #: [id number]    Mfr: hospira    Comments: 30 mg given per dr.Fountain    Patient tolerated injection without complications    Given by: Arlyss Repress CMA, (February 13, 2010 10:48 AM)  Injection # 2:    Medication: Promethazine up to 50mg     Diagnosis: MIGRAINE, UNSPEC., W/O INTRACTABLE MIGRAINE (ICD-346.90)    Route: IM    Site: RUOQ gluteus    Exp Date: 07/12/2011    Lot #: [id number]    Mfr: baxter    Comments: 25mg  given per dr.Hermosa    Patient tolerated injection without complications  Medication # 1:    Medication: Albuterol Sulfate Sol 1mg  unit dose    Diagnosis: COPD (ICD-496)    Dose: 2.5mg /54ml    Route: inhaled    Exp Date: 07/23/2011    Lot #: [id number]    Mfr: nephron    Given by: Arlyss Repress CMA, (February 13, 2010 9:52 AM)  Medication # 2:    Medication: Atrovent 1mg  (Neb)    Diagnosis: COPD (ICD-496)    Dose: 0.5mg /0.02    Route: inhaled    Exp Date: 03/12/2011    Lot #: [id number]    Mfr: nephron    Patient tolerated medication without complications    Given by: Arlyss Repress CMA, (February 13, 2010 9:50AM)  Orders Added: 1)  A1C-[facility] [83036] 2)  Lipid-[facility] [78295-62130] 3)  Ketorolac-Toradol 15mg  [J1885] 4)  Promethazine up to 50mg  [J2550] 5)  Albuterol Sulfate Sol 1mg  unit dose [J7613] 6)  Atrovent 1mg  (Neb) [Q6578] 7)  [facility]- Est  Level 4 [46962]

## 2010-09-18 NOTE — Progress Notes (Signed)
Summary: refill  Medications Added VICODIN 5-500 MG TABS (HYDROCODONE-ACETAMINOPHEN) 1 by mouth for migraine, two times a day as needed HA       Phone Note Refill Request Call back at Home Phone [phone] Message from:  Patient  Refills Requested: Medication #1:  VICODIN 5-500 MG TABS 1 by mouth for migraine Please call when ready  Initial call taken by: De Nurse,  September 09, 2010 10:47 AM    New/Updated Medications: VICODIN 5-500 MG TABS (HYDROCODONE-ACETAMINOPHEN) 1 by mouth for migraine, two times a day as needed HA Prescriptions: VICODIN 5-500 MG TABS (HYDROCODONE-ACETAMINOPHEN) 1 by mouth for migraine, two times a day as needed HA  #20 x 0   Entered and Authorized by:   Milinda Antis MD   Signed by:   Milinda Antis MD on 09/09/2010   Method used:   Handwritten   RxID:   [id number]

## 2010-09-25 ENCOUNTER — Ambulatory Visit (INDEPENDENT_AMBULATORY_CARE_PROVIDER_SITE_OTHER): Payer: Medicaid Other | Admitting: Family Medicine

## 2010-09-25 ENCOUNTER — Encounter: Payer: Self-pay | Admitting: Family Medicine

## 2010-09-25 DIAGNOSIS — K219 Gastro-esophageal reflux disease without esophagitis: Secondary | ICD-10-CM

## 2010-09-25 DIAGNOSIS — R300 Dysuria: Secondary | ICD-10-CM

## 2010-09-25 DIAGNOSIS — E119 Type 2 diabetes mellitus without complications: Secondary | ICD-10-CM

## 2010-09-25 DIAGNOSIS — N390 Urinary tract infection, site not specified: Secondary | ICD-10-CM | POA: Insufficient documentation

## 2010-09-25 LAB — POCT UA - MICROSCOPIC ONLY: WBC, Ur, HPF, POC: >20

## 2010-09-25 LAB — POCT URINALYSIS DIPSTICK
Glucose, UA: NEGATIVE
Ketones, UA: NEGATIVE
Spec Grav, UA: 1.025

## 2010-09-25 LAB — POCT GLYCOSYLATED HEMOGLOBIN (HGB A1C): Hemoglobin A1C: 5.5

## 2010-09-25 MED ORDER — PANTOPRAZOLE SODIUM 40 MG PO TBEC: 40.0000 mg | DELAYED_RELEASE_TABLET | Freq: Every day | ORAL | Status: DC

## 2010-09-25 MED ORDER — CIPROFLOXACIN HCL 500 MG PO TABS: 500.0000 mg | ORAL_TABLET | Freq: Two times a day (BID) | ORAL | Status: AC

## 2010-09-25 MED ORDER — PHENAZOPYRIDINE HCL 200 MG PO TABS: 200.0000 mg | ORAL_TABLET | Freq: Three times a day (TID) | ORAL | Status: AC | PRN

## 2010-09-25 NOTE — Assessment & Plan Note (Signed)
Will switch to protonix, aware PA will be needed, explained this to pt, however, she has tried all other preferred medicatons also has known hiatal hernia, if symptoms with pills getting stuck do not improve then set up MBS

## 2010-09-25 NOTE — Progress Notes (Signed)
  Subjective:    Patient ID: [name], female    DOB: 09/09/1955, 55 y.o.   MRN: [id number]  HPI  1.Last week, had difficulty urinating, pressure in back , +dysuria, no vag discharge, drinking plenty fluids, sat/sun and urinary frequency, small amounts each time, no blood in urine, +constipation, took lactulose 3 times this week to have bowel movements, no blood in stool, no small caliber stool   2. GERD- feels omeprazole not working as well as protonix did, tried Prevacid and Nexium in the past, Has feeling her pills get stuck at times, sits upright after meals, continues to have reflux     Review of Systems Feels like her mouth his dry as well, and hair is thinning some, no abd pain, no fever , no vomiting,          Objective:   Physical Exam GEN- smell of tobacco, NAD, alert and oriented HEENT- Dry tongue, mucousa moist, oropharynx clear Neck- supple, 0.5cm anterior cervical node, mild TTP  ABD- NABS, soft, TTP over suprapubic and lower quandrants, no rebound, no guarding, no mass felt No CVA tenderness, bladder not palpable   Ext- no edema, sensation grossly in tact    Assessment & Plan:

## 2010-09-25 NOTE — Assessment & Plan Note (Addendum)
Treat with Cipro secondary to allergies x7 days,with co-morbiditers of Diabetes mellitus Pyridium Will hold Notripytline as pt does not care to take the medication and she had symptoms of urinary retention

## 2010-09-25 NOTE — Patient Instructions (Addendum)
I will send a new prescription for the protonix  Stop the nortriptyline  We will call with your A1C result  You have a urine infection- take the antibiotics as prescribed Follow-up in 3 months, return sooner if your urine symptoms do not improve

## 2010-10-04 ENCOUNTER — Other Ambulatory Visit: Payer: Self-pay | Admitting: Family Medicine

## 2010-10-04 MED ORDER — HYDROCODONE-ACETAMINOPHEN 5-500 MG PO TABS: 1.0000 | ORAL_TABLET | Freq: Four times a day (QID) | ORAL | Status: DC | PRN

## 2010-10-13 ENCOUNTER — Ambulatory Visit (INDEPENDENT_AMBULATORY_CARE_PROVIDER_SITE_OTHER): Payer: Medicaid Other

## 2010-10-13 ENCOUNTER — Inpatient Hospital Stay (INDEPENDENT_AMBULATORY_CARE_PROVIDER_SITE_OTHER)
Admission: RE | Admit: 2010-10-13 | Discharge: 2010-10-13 | Disposition: A | Discharge status: Home / Self Care | Payer: Medicaid Other | Source: Ambulatory Visit | Attending: Family Medicine | Admitting: Family Medicine

## 2010-10-13 DIAGNOSIS — M1710 Unilateral primary osteoarthritis, unspecified knee: Secondary | ICD-10-CM

## 2010-10-13 DIAGNOSIS — M79609 Pain in unspecified limb: Secondary | ICD-10-CM

## 2010-10-14 ENCOUNTER — Telehealth: Payer: Self-pay | Admitting: Family Medicine

## 2010-10-14 NOTE — Telephone Encounter (Signed)
[name] is already seen by [facility]. Please ask her why she would need a new referral. She could call and schedule a follow-up appt

## 2010-10-14 NOTE — Telephone Encounter (Signed)
Called patient, says she already has appointment with [facility] on Thursday. Told patient she does not need a new referral since she has already been seen there.Franke Menter, [name]

## 2010-10-14 NOTE — Telephone Encounter (Signed)
Pt seen at urgent care last night, pt has a joint disease & MD at urgent care suggested she be seen at [facility] orth[facility], pt wants to know if MD can make referral since she has medicaid?

## 2010-10-22 LAB — POCT I-STAT, CHEM 8
Calcium, Ion: 1.13 mmol/L (ref 1.12–1.32)
Glucose, Bld: 88 mg/dL (ref 70–99)
HCT: 36 % (ref 36.0–46.0)
Hemoglobin: 12.2 g/dL (ref 12.0–15.0)

## 2010-10-25 ENCOUNTER — Other Ambulatory Visit: Payer: Self-pay | Admitting: Family Medicine

## 2010-10-28 NOTE — Telephone Encounter (Signed)
Refill request

## 2010-11-01 ENCOUNTER — Encounter: Payer: Self-pay | Admitting: Family Medicine

## 2010-11-01 ENCOUNTER — Ambulatory Visit (INDEPENDENT_AMBULATORY_CARE_PROVIDER_SITE_OTHER): Payer: Medicaid Other | Admitting: Family Medicine

## 2010-11-01 VITALS — BP 106/60 | HR 88 | Ht 60.0 in | Wt 185.7 lb

## 2010-11-01 DIAGNOSIS — G43019 Migraine without aura, intractable, without status migrainosus: Secondary | ICD-10-CM

## 2010-11-01 DIAGNOSIS — J309 Allergic rhinitis, unspecified: Secondary | ICD-10-CM

## 2010-11-01 DIAGNOSIS — G43909 Migraine, unspecified, not intractable, without status migrainosus: Secondary | ICD-10-CM

## 2010-11-01 DIAGNOSIS — M1990 Unspecified osteoarthritis, unspecified site: Secondary | ICD-10-CM

## 2010-11-01 MED ORDER — PROMETHAZINE HCL 25 MG/ML IJ SOLN
25.0000 mg/kg | Freq: Once | INTRAMUSCULAR | Status: AC
  Administered 2010-11-01: 2105 mg via INTRAMUSCULAR

## 2010-11-01 MED ORDER — HYDROCODONE-ACETAMINOPHEN 5-500 MG PO TABS: 1.0000 | ORAL_TABLET | Freq: Four times a day (QID) | ORAL | Status: DC | PRN

## 2010-11-01 MED ORDER — FLUTICASONE PROPIONATE 50 MCG/ACT NA SUSP: 2.0000 | Freq: Every day | NASAL | Status: DC

## 2010-11-01 MED ORDER — KETOROLAC TROMETHAMINE 30 MG/ML IJ SOLN
30.0000 mg | Freq: Once | INTRAMUSCULAR | Status: AC
  Administered 2010-11-01: 30 mg via INTRAMUSCULAR

## 2010-11-01 MED ORDER — ALBUTEROL 90 MCG/ACT IN AERS: 2.0000 | INHALATION_SPRAY | RESPIRATORY_TRACT | Status: DC | PRN

## 2010-11-01 NOTE — Progress Notes (Signed)
  Subjective:    Patient ID: [name], female    DOB: January 28, 1956, 55 y.o.   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, a few years ago, now feels sore over the region for the past few days , no change in color, no change in scar   Cough- cough, sneezing, runny nose itchy eyes for the past few days, feels like it is her allergies, out of flonase, has not needed any increase use of inhalers, no SOB, no chest pain, no sick contacts, no fever, cough non productive       Review of Systems per above      Objective:   Physical Exam  GEN- NAD, vitals noted  HEENT- Perrl, EOMI, fundoscopic exam benign, oropharynx clear, clear conjunctiva  CVS- RRR, no murmur   RESP- CTAB, no wheeze, normal WOB  Left knee- TTP anterior knee and inferior joint lines, no effusion, normal ROM , no crepitus, moderate sub cutanoues fat on medial aspects of both knees  Left arm- lateral arm well healed surgical scar, no hyperpigmentation noted in lesion, no palpable lesions, mild tendereness on most lateral portion of scar, no erythema        Assessment & Plan:

## 2010-11-01 NOTE — Patient Instructions (Signed)
Use the vicodin as needed for knee pain the new few weeks If you notice any change in the mole region please let me know Use the flonase for your allergies and claritin If your breathing worsens then come back to be seen

## 2010-11-02 NOTE — Assessment & Plan Note (Signed)
Given IM phenergan and Toradaol, which helps her intractable migraines

## 2010-11-02 NOTE — Assessment & Plan Note (Signed)
Refilled flonase Restart claritin No suggestive of COPD exaberbation

## 2010-11-02 NOTE — Assessment & Plan Note (Signed)
Known DJD, seen by ortho s/p CSI, encouraged movement to avoid getting stiff PRN Vicodin

## 2010-11-04 ENCOUNTER — Telehealth: Payer: Self-pay | Admitting: *Deleted

## 2010-11-04 NOTE — Telephone Encounter (Signed)
Called pharmacy, [name] does not take Lovaza her husband does, there was a mix -up, [facility] [address] [facility]

## 2010-11-04 NOTE — Telephone Encounter (Signed)
PA required for Lovaza, form placed in MD box.

## 2010-11-05 ENCOUNTER — Other Ambulatory Visit: Payer: Self-pay | Admitting: Family Medicine

## 2010-11-05 NOTE — Telephone Encounter (Signed)
Refill request

## 2010-11-16 LAB — POCT HEMOGLOBIN-HEMACUE: Hemoglobin: 12.9 g/dL (ref 12.0–15.0)

## 2010-11-16 LAB — GLUCOSE, CAPILLARY: Glucose-Capillary: 124 mg/dL — ABNORMAL HIGH (ref 70–99)

## 2010-11-17 LAB — POCT I-STAT, CHEM 8
Calcium, Ion: 1.16 mmol/L (ref 1.12–1.32)
Chloride: 110 mEq/L (ref 96–112)
HCT: 35 % — ABNORMAL LOW (ref 36.0–46.0)
Sodium: 141 mEq/L (ref 135–145)
TCO2: 19 mmol/L (ref 0–100)

## 2010-11-17 LAB — D-DIMER, QUANTITATIVE: D-Dimer, Quant: 0.33 ug/mL-FEU (ref 0.00–0.48)

## 2010-11-21 ENCOUNTER — Ambulatory Visit (INDEPENDENT_AMBULATORY_CARE_PROVIDER_SITE_OTHER): Payer: Medicaid Other | Admitting: Family Medicine

## 2010-11-21 ENCOUNTER — Encounter: Payer: Self-pay | Admitting: Family Medicine

## 2010-11-21 VITALS — BP 126/76 | HR 96 | Temp 98.2°F | Wt 185.2 lb

## 2010-11-21 DIAGNOSIS — F411 Generalized anxiety disorder: Secondary | ICD-10-CM

## 2010-11-21 DIAGNOSIS — J329 Chronic sinusitis, unspecified: Secondary | ICD-10-CM

## 2010-11-21 DIAGNOSIS — J449 Chronic obstructive pulmonary disease, unspecified: Secondary | ICD-10-CM

## 2010-11-21 MED ORDER — ALPRAZOLAM 0.5 MG PO TABS: 0.5000 mg | ORAL_TABLET | Freq: Two times a day (BID) | ORAL | Status: DC | PRN

## 2010-11-21 MED ORDER — PREDNISONE 20 MG PO TABS: ORAL_TABLET | ORAL | Status: DC

## 2010-11-21 MED ORDER — MOXIFLOXACIN HCL 400 MG PO TABS: 400.0000 mg | ORAL_TABLET | Freq: Every day | ORAL | Status: AC

## 2010-11-21 NOTE — Assessment & Plan Note (Signed)
Will continue nasal steroid, anti-histamine

## 2010-11-21 NOTE — Progress Notes (Signed)
  Subjective:    Patient ID: [name], female    DOB: Feb 18, 1956, 55 y.o.   MRN: [id number]  HPI  Cough, congestion, sinus pressure-  Since last visit 2 weeks ago, using albuterol at least 4x a day, but feels breathing is not the worse part but cough, no fever, green/yellow sputum and nasal drainage from both nares, pressure across bridge of nose. No chest pain. Using flonase and loratadine but this has not helped very much.   Anxiety - feels like she is having a nervous breakdown, she is caring for grandchildren, mother of children in the home but not helping, she has no time for herself, feels everyone, is directing their problems at her, gets anxious and cries at times, does not feel depressed, no SI, worried about her son who is having cardiac work-up, no history of SSRI  Use per report    Review of Systems per above     Objective:   Physical Exam  Constitutional: She appears well-developed and well-nourished. No distress.  HENT:  Right Ear: Tympanic membrane normal.  Left Ear: Tympanic membrane normal.  Nose: Mucosal edema and rhinorrhea present. Right sinus exhibits maxillary sinus tenderness and frontal sinus tenderness. Left sinus exhibits maxillary sinus tenderness and frontal sinus tenderness.  Mouth/Throat: Uvula is midline, oropharynx is clear and moist and mucous membranes are normal.  Eyes: Conjunctivae and EOM are normal. Pupils are equal, round, and reactive to light. Right eye exhibits no discharge. Left eye exhibits no discharge. No scleral icterus.  Neck: Normal range of motion. Neck supple.  Cardiovascular: Normal rate, regular rhythm, normal heart sounds and intact distal pulses.   Pulmonary/Chest: Effort normal. No respiratory distress. She has wheezes.       rhonchi RLL, fair air movement to bases  Lymphadenopathy:    She has no cervical adenopathy.  Skin: She is not diaphoretic.  Psychiatric: She has a normal mood and affect. Judgment and thought content normal.              Assessment & Plan:

## 2010-11-21 NOTE — Assessment & Plan Note (Signed)
Treat acute exacerbation and sinus infection Prednisone and antibiotcs

## 2010-11-21 NOTE — Patient Instructions (Signed)
Use the antibiotics and prednisone for sinus infection and Bronchitis/COPD Use the xanax as needed twice a day  Return if you do not improve

## 2010-11-21 NOTE — Assessment & Plan Note (Signed)
Pt in stressful environment at this time, and tends to get very anxious and upset when her family members are sick as she relies on her husband and son. No SI Will start prn xanax. If this is needed for long-term discussed with pt , she would be best served with an SSRI

## 2010-12-04 ENCOUNTER — Other Ambulatory Visit: Payer: Self-pay | Admitting: Family Medicine

## 2010-12-05 NOTE — Telephone Encounter (Signed)
Refill request

## 2010-12-06 ENCOUNTER — Other Ambulatory Visit: Payer: Self-pay | Admitting: Family Medicine

## 2010-12-06 DIAGNOSIS — E119 Type 2 diabetes mellitus without complications: Secondary | ICD-10-CM

## 2010-12-06 MED ORDER — GLUCOSE BLOOD VI STRP: ORAL_STRIP | Status: DC

## 2010-12-06 MED ORDER — ACCU-CHEK AVIVA PLUS W/DEVICE KIT: 1.0000 | PACK | Freq: Every day | Status: DC

## 2010-12-06 MED ORDER — ACCU-CHEK FASTCLIX LANCETS MISC: 1.0000 | Freq: Every day | Status: DC

## 2010-12-09 ENCOUNTER — Other Ambulatory Visit: Payer: Self-pay | Admitting: Family Medicine

## 2010-12-09 NOTE — Telephone Encounter (Signed)
Refill request

## 2010-12-12 ENCOUNTER — Ambulatory Visit: Payer: Medicaid Other | Admitting: Psychology

## 2010-12-16 ENCOUNTER — Telehealth: Payer: Self-pay | Admitting: Family Medicine

## 2010-12-16 MED ORDER — HYDROCODONE-ACETAMINOPHEN 5-500 MG PO TABS: ORAL_TABLET | ORAL | Status: DC

## 2010-12-16 NOTE — Telephone Encounter (Signed)
Script sent via phone refill

## 2010-12-16 NOTE — Telephone Encounter (Signed)
Pt asking for rx for vicodin for her headaches.

## 2010-12-18 ENCOUNTER — Ambulatory Visit (INDEPENDENT_AMBULATORY_CARE_PROVIDER_SITE_OTHER): Payer: Medicaid Other | Admitting: Psychology

## 2010-12-18 DIAGNOSIS — F17200 Nicotine dependence, unspecified, uncomplicated: Secondary | ICD-10-CM

## 2010-12-18 DIAGNOSIS — F411 Generalized anxiety disorder: Secondary | ICD-10-CM

## 2010-12-18 NOTE — Patient Instructions (Signed)
Please schedule a follow-up for:  June 6th at 2:00. I will talk to Dr. Kathrynn Running and Dr. Jeanice Lim about medication options for your depression since you said you are interested in this.  We may need to talk to your neurologist to see what his concerns are about your epilepsy.  I heard what you said about your concerns for weight gain on a medication.

## 2010-12-18 NOTE — Progress Notes (Signed)
[name] was seen for an initial beh med appointment on 10/31/2009.  She did not follow-up.  Today she reports that was because of her multiple health problems including daily headaches that get in the way of her wanting to do much or go anywhere.  She reports today a similar story to last year centered around the anniversary of her mother's death (two years in 01/08/23), Mother's Day and the anniversary of her brother's death December 09, 2022 - 25 years ago).  She feels as though she has lost a lot of people and even though she is satisfied that they are in a better place, the loss is hard for her.  Current stressors include living with her son and his ex-girlfriend who is the mother of her biological grandchild [name], 24) and another child [name], 3) whom [name] cares for when the mom [name]) is at work.  She does not like Samantha and the relationship causes significant stress.  She is also stressed over news about her son's health. She thinks about death a lot; not her own but losing additional family members.  This causes a great deal of distress.  Physical issues remain a problem.  She has a daily headache that interferes with her function.  An orthopedic doctor told her that it could be a cervical disk issue and she hopes this is the case.  The neurologist has reportedly exhausted the possibilities from his end.  [name] is interested in taking a medication for her depressed mood.  She currently takes Xanax occasionally but limits it to prevent getting "hooked."  She says the neurologist too her off a previous antidepressant because of a UTI.  She could not remember the name.  It may have been nortriptyline.  She said her neurologist said she would need to be monitored by a psychiatrist for medication secondary to her epilepsy.  Her brother takes Zoloft and Xanax and reportedly does well.

## 2010-12-18 NOTE — Assessment & Plan Note (Signed)
Smokes one pack per day and is not interested in quitting currently.  Lung cancer in family members weighs on her mind as does the impact of smoking near her grandchildren.  Two other smokers in the house.  Her husband has never smoked.  Has an electronic cigarette.  Will follow.

## 2010-12-18 NOTE — Assessment & Plan Note (Signed)
Diagnosis remains unclear.  PHQ-9 was 16 today but the most notable symptoms are sleep, appetite and energy.  She notes anhedonia and feeling down several days out of the week.  She does report periods of tearfulness "out of nowhere" and then typically ties them to thoughts of her mother or worries about her family's health.  Report of mood today is down.  Affect is within normal limits.  She gets tearful on one occasion but manages it well.  Her physical symptoms seem to create the biggest impact on her function and it is hard to tease out mood issues independent of her chronic daily headache.  Given age, health status, anniversary of deaths in her family and history of poor follow-up, will endeavor to go slow.  This may get better of its own accord.  [name] did report that talking helps and elected to schedule another appointment.     Of note:  Initial evaluation a year ago indicated a family history of Bipolar Disorder.  She does say this diagnosis is true for her son but denies it in other biological relatives.  This could run in her husband's side of the family.  See patient instructions for further plan.

## 2010-12-19 ENCOUNTER — Other Ambulatory Visit: Payer: Self-pay | Admitting: Family Medicine

## 2010-12-19 MED ORDER — OMEPRAZOLE 40 MG PO CPDR: DELAYED_RELEASE_CAPSULE | ORAL | Status: DC

## 2010-12-19 NOTE — Progress Notes (Signed)
PT wanted to change back to prilosec. Will send to [facility] 40mg  BID

## 2010-12-24 NOTE — Op Note (Signed)
NAMETRESEA, HEINE                   ACCOUNT NO.:  [id number]   MEDICAL RECORD NO.:  [id number]          PATIENT TYPE:  AMB   LOCATION:  DSC                          FACILITY:  [facility]   PHYSICIAN:  Robert A. Thurston Hole, M.D. DATE OF BIRTH:  10/26/1955   DATE OF PROCEDURE:  04/04/2009  DATE OF DISCHARGE:                               OPERATIVE REPORT   PREOPERATIVE DIAGNOSES:  1. Left shoulder partial rotator cuff tear and partial labrum tear.  2. Left shoulder impingement.  3. 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   POSTOPERATIVE DIAGNOSES:  1. Left shoulder partial rotator cuff tear and partial labrum tear.  2. Left shoulder impingement.  3. 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   PROCEDURES:  1. Left shoulder examination under anesthesia followed by arthroscopic      debridement partial rotator cuff tear and partial labrum tear.  2. Left shoulder subacromial decompression.  3. Left shoulder distal clavicle excision.   SURGEON:  Elana Alm. Thurston Hole, MD   ASSISTANT:  Julien Girt, PA   ANESTHESIA:  General.   OPERATIVE TIME:  45 minutes.   COMPLICATIONS:  None.   INDICATIONS FOR PROCEDURE:  [name] is a 53-year woman who has had over  a year of left shoulder pain with exam and MRI documenting partial  rotator cuff tear and partial labrum tear.  She has failed conservative  care and is now to undergo arthroscopy.   DESCRIPTION:  Ms. Greif was brought into the operating room on April 04, 2009, and after an interscalene block was placed in holding by  Anesthesia.  She was placed on the operative table in supine position.  Her left shoulder was examined under anesthesia.  She had full range of  motion and her shoulder was stable to ligamentous exam.  She was then  placed in beach chair position and her shoulder and her arm was prepped  using sterile DuraPrep and draped using sterile  technique.  Originally  through a posterior arthroscopic portal, the arthroscope with a pump  attached was placed into an anterior portal and an arthroscopic probe  was placed.  On initial inspection, the articular cartilage in the  glenohumeral joint was found to be intact.  The anterior, superior, and  posterior labrum showed partial tearing 25% which was debrided.  The  anterior inferior labrum and anterior inferior glenohumeral ligament  complex was intact.  Superior labrum was moderately hypermobile but was  otherwise intact.  Biceps tendon showed partial tearing 25%, which was  debrided.  The rotator cuff showed a partial tear, 25% of the  supraspinatus and infraspinatus and this was debrided, but it was  otherwise well attached and the rest of the rotator cuff was intact.  Inferior capsular recess free of pathology.  Subacromial space was  entered and a lateral arthroscopic portal was made.  Moderately  thickened bursitis was resected.  The rotator cuff was somewhat inflamed  and thickened on the bursal surface but no evidence of  a tear.  Impingement was noted and a subacromial decompression was carried out  removing 6-8 mm of the undersurface of the anterior, anterolateral,  anteromedial acromion and CA ligament release carried out as well.  The  [facility] joint showed significant spurring and degenerative changes and distal  6 mm of clavicle was resected with a 6-mm bur.  After this was done, the  shoulder could be brought through full range of motion with no  impingement on the rotator cuff.  At this point, it was felt that all  pathology had been satisfactorily addressed.  The instruments removed.  Portals were closed with 3-0 nylon suture.  Sterile dressings and a  sling were applied, and the patient awakened, taken to recovery room in  stable condition.   FOLLOWUP CARE:  Ms. Magallanes will be followed overnight for observation due  to her severe sleep apnea.  She will be monitored with  her CPAP machine.  She will be discharged tomorrow on Percocet and Robaxin and begin early  physical therapy.  She will be seen back in the office in a week for  sutures out and followup.      Robert A. Thurston Hole, M.D.  Electronically Signed     RAW/MEDQ  D:  04/04/2009  T:  04/05/2009  Job:  [id number]

## 2010-12-24 NOTE — Assessment & Plan Note (Signed)
[facility]                         GASTROENTEROLOGY OFFICE NOTE   [name], [name]                          MRN:          [id number]  DATE:11/25/2007                            DOB:          1956/05/16    [name] is seen as an emergency add-on patient today by Dr. Regino Schultze  nurse because of acute constipation. However, in talking with the  patient, she has had constipation for many months and has been under the  care of Dr. Wilhemina Bonito at [facility] who also insisted  that the patient be seen today on an emergent basis. She has seen Dr.  Juanda Chance in the past for a diarrhea workup and has irritable bowel  syndrome with a negative colonoscopy in October 2007.   The patient denies narcotic abuse but has COPD and is on a variety of  medications including a variety of inhalers, also Lipitor 20 mg a day  for hyperlipidemia, Topamax 200 mg a day because of a seizure disorder  along with Lamictal 200 mg a day, omeprazole 40 mg a day and for reasons  that are unclear prednisone 2.5 mg a day. She gives a history of  allergic reactions to SULFA, PENICILLIN and BIAXIN.   She apparently follows a regular diet and denies any specific food  intolerances. KUB of the abdomen was done on November 22, 2007 and showed  stool in the abdomen but otherwise was unremarkable. On reviewing her  chart from Dr. Sandria Manly, she apparently was being treated with fluconazole  also for possible Candida or thrush infection in her throat. Other  medical problems per Dr. Sandria Manly include degenerative joint disease. Recent  labs showed normal CBC, metabolic profile and thyroid functions. The  patient apparently has been on MiraLax 2-3 times a day without  improvement.   PHYSICAL EXAMINATION:  Awake and alert in no acute distress, sitting in  a wheelchair. She has some mild shortness of breath but chest exam shows  her chest to be clear without wheezes or rhonchi. She refused to  be  weighed.  Blood pressure is 100/56 and pulse was 70 and regular. She is in a  regular rhythm without murmur, gallop or rub.  ABDOMEN:  Obese but there was no distension, organomegaly, masses or  tenderness. Bowel sounds were entirely normal.  PERIPHERAL EXTREMITIES:  Unremarkable.  RECTUM:  Inspection of the rectum showed a small right lateral external  hemorrhoids without fissures or fistulae. There was no rectal masses,  tenderness, or impaction. There was soft stool present that was guaiac  negative.   ASSESSMENT:  [name] has rather classic irritable bowel syndrome with  alternating diarrhea and constipation. I fail to see the urgency of her  problem today since this historically has been a chronic recurring  problem. She also does not have any large, swollen hemorrhoids or  evidence of other rectal disturbances.   RECOMMENDATIONS:  1. OsmoPrep to clean her colon since I think this patient probably has      normal transient constipation.  2. Once prep completed, will ask her to  follow a high fiber diet with      one tablespoon of Benefiber daily in her cereal.  3. Trial of Amitiza 24 mcg twice a day with meals.  4. Local anal care with sitz baths, and AnaMantle cream locally.  5. Followup with Dr. Juanda Chance and Dr. Sandria Manly.   ADDENDUM:  The patient may need sitz marker studies depending on  clinical response, but I suspect she has normal transient constipation.  On reviewing the records from Dr. Juanda Chance, she also has chronic GERD and  is on Omeprazole but I cannot see where this has really been documented.  Her endoscopy on May 28, 2006 was normal. If she does have recurrent  Candida infection, she needs to be off of Omeprazole therapy.     Veronica Rea. Jarold Motto, MD, Caleen Essex, FAGA  Electronically Signed    DRP/MedQ  DD: 11/25/2007  DT: 11/25/2007  Job #: [id number]

## 2010-12-24 NOTE — H&P (Signed)
[name], [name]                   ACCOUNT NO.:  [id number]   MEDICAL RECORD NO.:  [id number]          PATIENT TYPE:  OBV   LOCATION:  2025                         FACILITY:  [facility]   PHYSICIAN:  Wayne A. Sheffield Slider, M.D.    DATE OF BIRTH:  May 15, 1956   DATE OF ADMISSION:  12/31/2007  DATE OF DISCHARGE:                              HISTORY & PHYSICAL   PRIMARY CARE PHYSICIAN:  Dr. Wilhemina Bonito, [facility].   CHIEF COMPLAINT:  Syncope, palpitations, and chest pain.   HISTORY OF PRESENT ILLNESS:  Ms. Tucciarone is a 55 year old with multiple  medical problems.  She presented to the emergency room today after  feeling funny for several days now.  She states she feels like, she is  drunk all the time.  She reports passing out 3 times yesterday and 2-3  times today.  She says her husband witnessed one of the episode today  and she said she was out for a couple of minutes.  Prior to passing out,  she has experienced dizziness and palpitations, and had some chest pain  with her most recent episode today.  She thinks she could still have  some chest pain currently, but also states that they could be from the  lead placement on her chest.  She has a history of a seizure disorder,  but denies any typical seizure-like activity.  There was no bowel or  bladder incontinence.  Her last known seizure was a month ago.  She does  also have a history of migraine headaches for many years.  She states  that her headaches have been getting worse over the past couple of  months.  She was followed by Dr. Orlin Hilding at [facility] and has also been referred to the headache clinic in the  past.  Her primary doctor recently started on baclofen as needed for  muscle contraction headaches.  This is her only new medication.  She has  not had any weakness or neurological findings with her recent episodes.   HOME MEDICATION LIST:  1. Albuterol 2 puffs every 4 hours as needed.  2.  Lamictal 150 mg by mouth twice daily.  3. Lipitor 20 mg by mouth once daily.  4. MiraLax 17 g by mouth as needed for constipation.  5. Prilosec 40 mg 1 tablet daily for heartburn.  6. Spiriva 18 mcg 1 inhalation daily.  7. Topamax 100 mg 1 tablet by mouth twice daily.  8. Allegra 180 mg daily.  9. Prednisone 2.5 mg p.o. daily for severe COPD.  10.Lactulose 30-45 mL as needed for constipation.  11.Vitamin D 50,000 unit capsule once a week for 8 weeks for a vitamin      D deficiency.  12.Baclofen 20 mg 1 tablet as needed for headaches, no more than 4      tablets in a day.   ALLERGIES:  Include SULFA, BIAXIN, AND PENICILLIN.   PAST MEDICAL HISTORY:  1. COPD.  2. History of melanoma stage III, status post resection in July 2005.  3. History of  a seizure disorder.  4. History of diet-controlled diabetes.  5. Obstructive sleep apnea.  6. Migraine and tension headaches.  7. Straus post bilateral tubal ligation.   SOCIAL HISTORY:  The patient lives with her husband as well as her son  and daughter-in-law.  She smokes a pack of cigarettes per day.  She  denies any alcohol or drug use.  She is unemployed and on disability.   PHYSICAL EXAMINATION:  VITAL SIGNS:  Temperature 97.3, pulse 88,  respirations 16, and blood pressure is 110/60.  GENERAL: The patient appears in no acute distress.  She is alert and  oriented.  HEENT:  Reveals a normocephalic and atraumatic head.  Mucous membranes  are moist.  LUNGS:  There is normal effort.  Lungs are clear to auscultation  bilaterally without any wheezes.  HEART:  Regular rate and rhythm without murmur.  ABDOMEN:  Obese, soft, and mildly tender to palpation in the right lower  quadrant, which the patient says she always has.  EXTREMITIES:  No edema.  NEUROLOGIC:  She is alert and oriented.  Cranial nerves are grossly  intact.  SKIN:  Turgor is normal.  Color is normal and there are no rashes.  PSYCH:  The patient does not appear depressed,  but is somewhat wide-eyed  and could be anxious appearing.   LABORATORY DATA:  Her white count is 16.8, hemoglobin 12.9, and  platelets are 279.  A comprehensive metabolic panel is normal.  D-dimer  is 0.3.  Point-of-care enzymes are negative x2.   Chest x-ray shows no acute disease.   EKG reveals normal sinus rhythm without any acute changes.   ASSESSMENT AND PLAN:  This is a 55 year old white female with a 2-3-day  history of feeling unusual as well as subjective syncopal episodes over  the past 2 days associated with palpitations and chest pain.  1. Syncope.  Her EKG, chest x-ray, and basic labs are all normal in      the emergency room.  There is a large differential here which      includes arrhythmia, vasovagal episode, atypical migraine,      hypoglycemia, panic attacks, etc.  We will admit the patient to a      telemetry bed to monitor her heart with rhythm.  We will monitor      for any seizure-like activity as well as her blood glucose.      Consider further workup, if any of the above measures results in      normal test.  2. Chest pain.  Her D-dimer is negative.  Her point-of-care enzymes      are negative.  Her chest x-ray is normal and her EKG is normal.  We      will obtain two sets of cardiac markers as well as continue her      Prilosec for reflux.  This is very typical chest pain and I doubt      it is a cardiac source.  She has had a Cardiolite in 2005 that      showed no ischemia.  I wonder, if some of her chest pain is due to      anxiety.  3. Headache.  This is chronic for her, although it sounds like a has      been worsening over the past several months.  We will continue her      home dose of Topamax and give Tylenol as needed.  4. Chronic obstructive pulmonary disease.  We  will continue the      patient's home Spiriva, her chronic prednisone dose, and allow      albuterol every 4 hours as needed.  5. Constipation.  We will continue her home regimen of  MiraLax and      lactulose as needed.  6. Seizure disorder.  Continue her home Lamictal dose.  It does not      sound like these recent episodes are consistent with her usual      seizure episodes.      Sylvan Cheese, M.D.  Electronically Signed      Wayne A. Sheffield Slider, M.D.  Electronically Signed    MJ/MEDQ  D:  01/01/2008  T:  01/01/2008  Job:  [id number]

## 2010-12-24 NOTE — Procedures (Signed)
[name], [name] NO.:  [id number]   MEDICAL RECORD NO.:  [id number]          PATIENT TYPE:  OUT   LOCATION:  SLEEP CENTER                 FACILITY:  [facility]   PHYSICIAN:  Oretha Milch, MD      DATE OF BIRTH:  [birth year]   DATE OF STUDY:  02/14/2009                            NOCTURNAL POLYSOMNOGRAM   REFERRING PHYSICIAN:   REFERRING PHYSICIAN:  Levander Campion MD   INDICATION FOR STUDY:  Ms. Dollinger is a 55 year old woman with known  obstructive sleep apnea on a prior sleep study on March 07, 2002.  RDI  was 17.5 events per hour and CPAP was titrated to 9 cm.  On a followup  split polysomnogram on February 29, 2004, moderate obstructive sleep apnea  was noted with an RDI of 24 and a CPAP requirement of 17 cm with an  oxygen saturation nadir of 72%.  She now complains of morning headaches  and excessive daytime somnolence.   This overnight polysomnogram was performed with a sleep technologist in  attendance.  EEG, EOG, EMG, EKG, and respiratory parameters were  recorded.  Sleep stages, arousals, limb movements, and respiratory data  were scored according to criteria laid out by the American Academy of  Sleep Medicine.   SLEEP ARCHITECTURE:  Lights out was at 10:16 p.m., lights on was at 5:01  a.m.  Total sleep time was 385 minutes with a sleep period time of 493  minutes and a sleep efficiency of 95%.  Sleep latency was 1.5 minutes  and latency to REM sleep was 70.5 minutes.  Sleep stages as the  percentage of total sleep time was N1 12.5%, N2 73%, N3 0%, and REM  sleep 23% (88 minutes of REM).  Good REM progression was noted with the  longest REM period around 2:30 a.m.  The sleep study was terminated  during REM sleep.   AROUSAL DATA:  A total of 104 arousals with an arousal index of 16  events per hour.   RESPIRATORY DATA:  CPAP was initiated with a small full-face mask at 7  cm and titrated to a final level of 18 cm due to respiratory events and  snoring.   At a level of 11 cm for 10.5 minutes of non-REM sleep, 4  hypopneas were noted with a low desaturation of 89%.  At a level of 17  cm for 29.5 minutes of sleep including 14 minutes of REM sleep, 1  obstructive apnea and 2 hypopneas were noted with a low desaturation of  91%.  At a final level of 18 cm for 201 minutes including 65 minutes of  REM sleep, 2 central apneas were noted with a low saturation of 94%.  This appears to be the optimal level used during the study.   OXYGEN DATA:  The lowest desaturation was 89%.  No desaturation was  noted at the higher levels of CPAP.   CARDIAC DATA:  No significant arrhythmias were noted.  The low heart  rate was 30 beats per minute.   MOVEMENT-PARASOMNIA:  No significant limb movements were noted.   DISCUSSION:  Good REM progression  was noted.  A small full-face mask was  used.   IMPRESSION:  1. Moderate obstructive sleep apnea with hypopneas causing oxygen      desaturation and sleep fragmentation.  2. This was corrected with a continuous positive airway pressure of 18      cm with a small full-face mask.  3. There was no evidence of cardiac arrhythmias, limb movements, or      behavioral disturbance during sleep.   RECOMMENDATIONS:  1. CPAP with a small full-face mask at 18 cm.  2. Weight loss should be encouraged.  3. She should be monitored for compliance at this level.  If excessive      daytime somnolence persists in spite of compliance (records as      recorded by a CPAP download), then therapy with stimulants such as      caffeine or modafinil can be considered.  4. She should be asked to avoid medications with sedating side      effects.  She should be cautioned against driving when sleepy.      Oretha Milch, MD  Electronically Signed     RVA/MEDQ  D:  02/14/2009 13:39:44  T:  02/15/2009 02:35:07  Job:  [id number]   cc:   Levander Campion, M.D.

## 2010-12-24 NOTE — Discharge Summary (Signed)
[name], [name]                   ACCOUNT NO.:  [id number]   MEDICAL RECORD NO.:  [id number]          PATIENT TYPE:  OBV   LOCATION:  2025                         FACILITY:  [facility]   PHYSICIAN:  Wayne A. Sheffield Slider, M.D.    DATE OF BIRTH:  07-31-1956   DATE OF ADMISSION:  12/31/2007  DATE OF DISCHARGE:  01/01/2008                               DISCHARGE SUMMARY   PRIMARY CARE [name]:  Dr. Rosalita Levan [facility].   CONSULTANTS:  None.   PROCEDURE:  None.   REASON FOR ADMISSION:  The patient is a 55 year old with multiple  medical problems, who was admitted after presenting to the emergency  department for syncope with 2-3 episodes on the day of admission and  three episodes prior to that.  Her husband witnessed one of the episodes  and reports loss of consciousness for several minutes.  Prior to passing  out, she had some dizziness and palpitations and with the episode  shortly prior to admission, some chest pain.   DISCHARGE DIAGNOSES:  1. Syncope, likely vasovagal.  2. Seizure disorder.  3. Type 2 diabetes, diet-controlled.  4. Obstructive sleep apnea .  5. COPD.  6. History of melanoma, stage III status post resection in July 2005.  7. History of migraine and tension headaches.   LABS AND STUDIES:  The patient was noted to have a white count of 16.8,  hemoglobin 12.9, and platelets 279.  The rest of her comprehensive  metabolic panel was normal.  D-dimer was normal at 0.3.  Point-of-care  cardiac enzymes in the emergency department were negative x2.  Cardiac  enzymes drawn on the floor were negative x3.  A chest x-ray showed no  active cardiopulmonary disease.  An EKG revealed normal sinus rhythm  without any acute changes.  A repeat EKG in the morning of discharge  revealed normal sinus rhythm with slightly low voltage.   HOSPITAL COURSE:  1. Syncope, an EKG and chest x-ray, basic labs were normal in the      emergency room with a large differential  including arrhythmia,      vasovagal syncope, atypical migraine, hyperglycemia, and panic      attack.  We admitted the patient to a telemetry bed to monitor      heart rhythm.  She remained in normal sinus rhythm in her entire      hospital stay.  She did not have any seizure-like activity while in      the hospital.  She had no episodes of hypoglycemia while in the      hospital with blood sugars ranging from 96-105.  As stated earlier,      her EKGs were grossly normal.  This episode seems most likely to be      vasovagal or atypical migraine.  Another consideration is that she      had recently been put on Baclofen and after taking one of those on      the day of admission, she did seem groggy according to her husband,      so  perhaps that contributed as well.  No further workup was deemed      necessary and the patient requested to go home with close followup      with Dr. Sandria Manly.  2. Chest pain.  The patient had a negative D-dimer and cardiac enzymes      were negative x5.  Normal chest x-ray, a relatively normal EKG.  We      will continue her on Prilosec for reflux.  The chest pain was very      atypical and did not seem like cardiac chest pain plus she had a      normal Cardiolite in 2005.  Question if there is a potential      anxiety component to her chest pain.  3. Headache.  The patient does have a chronic problem with headache,      this followed both by Dr. Sandria Manly and Dr. Orlin Hilding for this and is on      Topamax.  She did receive a dose of Ultram for headache that she      had while during her admission and this relieved her headache. I      contemplated sending her out with a prescription for Ultram, but it      does decrease the seizure threshold.  Additionally we discontinued      her Baclofen and that can also decrease seizure threshold.  The      patient will continue to have to work with Dr. Sandria Manly and Dr. Orlin Hilding      to find potential remedies that will increase her  seizure      threshold.  4. Chronic obstructive pulmonary disease.  We continued the patient's      home Spiriva, her chronic prednisone, and her albuterol p.r.n.  5. Constipation.  Continued home MiraLax and lactulose p.r.n.  6. Seizure disorder.  The patient did not have any seizures while      during her admission and we continued her on home Lamictal and      Topamax   DISPOSITION:  The patient is discharged in stable condition.  Discharge  is pending. Test results at the time of discharge are none.  The patient  is discharged to home.  The patient was instructed to call when the  office opens back up on Tuesday to make a followup appoint with Dr. Sandria Manly  within the next 2 weeks, preferably within the next week.   FOLLOWUP ISSUES:  We are just following up her headache and any further  episodes of syncope.      Asher Muir, MD  Electronically Signed      Arnette Norris. Sheffield Slider, M.D.  Electronically Signed    SO/MEDQ  D:  01/01/2008  T:  01/02/2008  Job:  [id number]   cc:   Wilhemina Bonito, M.D.  Catherine A. Orlin Hilding, M.D.

## 2010-12-27 NOTE — Assessment & Plan Note (Signed)
[facility]                           GASTROENTEROLOGY OFFICE NOTE   CELINE, DISHMAN                          MRN:          [id number]  DATE:05/20/2006                            DOB:          Mar 29, 1956    [name] is a 55 year old white female patient of Dr. Dillard Essex from [facility] who has 2 months' history of abdominal pain, frequent bowel  movements during the day as well as at night, crampy pain relieved partly by  the diarrhea, worse after meals usually while she is still eating or shortly  after meals.  She still has a little bloating.  Her usual bowel habits are  constipated.  About 4 months ago, she was started on Topamax 50 mg twice  daily with initial dose of 25 mg daily for control of headaches.  The  diarrhea has developed about 3 months ago.  She has to get up at least once  or twice a night.  She has lost a tremendous amount of weight over the past  year or so from over 300 pounds to currently 211 pounds with goal weight of  160 pounds.  She has lost altogether about 117 pounds in 6 months.  She has  early satiety, decreased appetite.  She also has dysphagia to solids and to  pills.  We have seen her in 2004 for dysphagia.  On upper endoscopy, she was  found to have esophageal dysmotility problem on barium swallow.  She had  gastroesophageal reflux and small hiatal hernia.  Maloney dilators were  passed during her endoscopy on September 2004 with no endoscopic evidence of  stricture.  The patient still continues to smoke and takes Protonix 40 mg  daily currently.  She is on Prilosec 20 mg twice daily.   MEDICATIONS:  1. Spiriva daily.  2. Albuterol 2 puffs daily.  3. Flovent 1 puff b.i.d.  4. Topamax 50 mg b.i.d.  5. Lipitor 20 mg p.o. daily.  6. Lisinopril 2.5 mg daily.  7. Omeprazole 20 mg b.i.d.  8. Lamictal 150 mg 2 q.h.s.  9. Nasocort 2 sprays daily.   PAST MEDICAL HISTORY:  1. Blood clotting disease.  2. DVT  in the right leg.  3. Sleep apnea.  4. Right knee surgery.  5. Diabetes.  6. Arthritis.  7. She had a hysterectomy in 2001.   FAMILY HISTORY:  Positive for heart disease in a grandfather and uncle.  Diabetes in grandmother.  Alcoholism in father, breast cancer in aunt.   SOCIAL HISTORY:  She is married, has 2 children.  She continues to smoke but  does not drink alcohol.   REVIEW OF SYSTEMS:  A weight loss of 117 pounds, swelling of her feet,  arthritic complaints, excessive thirst, vision changes, back pain, new  headaches, shortness of breath, and muscle cramps.   PHYSICAL EXAMINATION:  VITAL SIGNS:  Blood pressure 100/58, pulse 88, and  weight 211 pounds.  GENERAL:  She was alert, oriented, and in no distress.  LUNGS:  No wheezes or rales.  Her voice was hoarse and raspy.  NECK:  Supple without adenopathy __________.  CARDIAC:  Normal S1, normal S2.  ABDOMEN:  Rather obese, tender diffusely, more so in the left lower quadrant  but also in the left upper and right upper quadrant as well as in the right  lower quadrant.  Bowel sounds were normoactive.  There was no rebound.  Liver edge at costal margin.  RECTAL EXAM:  With normal rectal tone.  Stool was formed, hemoccult  negative.  EXTREMITIES:  Trace edema.   Upper abdominal ultrasound done on March 04, 2006 was unremarkable.  There  was some limitation because of the patient's size.   IMPRESSION:  1. A 55 year old white female with a history of benign distal esophageal      stricture with recurrent pill dysphagia and solid food dysphagia.  Rule      out esophageal dysmotility.  Rule out recurrent stricture.  2. Diarrhea of 3 months' duration, most likely related to use of Topamax,      rule out microscopic or collagenous colitis, rule out irritable bowel      syndrome, rule out inflammatory bowel disease.  3. Massive weight loss, partially intentional resulting in decreased      appetite as well as early satiety.  This  again could be related to      Topamax.  Rule out peptic ulcer disease, rule out gastric outlet      obstruction.  4. History of asthmatic bronchitis, hoarseness, rule out hoarseness due to      pharyngolaryngeal reflux.   PLAN:  1. Upper endoscopy.  Passage of esophageal dilation.  2. Switch to Nexium 40 mg daily.  Samples given for 3 weeks.  3. Colonoscopy scheduled with biopsies to rule out microscopic colitis.  4. Consider decreasing Topamax dose.  The patient is reluctant to change      the dose because      the medication controls her headaches, but it would be a consideration      to cut her back down to 25 mg twice daily to see if her diarrhea stops.       [name]. Juanda Chance, MD      DMB/MedQ  DD:  05/20/2006  DT:  05/21/2006  Job #:  [id number]   cc:   Barth Kirks, M.D.

## 2010-12-27 NOTE — Procedures (Signed)
NAME:  [name], [name]                 ACCOUNT NO.:  [id number]   MEDICAL RECORD NO.:  [id number]          PATIENT TYPE:  OUT   LOCATION:  SLEEP CENTER                 FACILITY:  [facility]   PHYSICIAN:  Clinton D. Maple Hudson, M.D. DATE OF BIRTH:  04/13/1956   DATE OF ADMISSION:  02/29/2004  DATE OF DISCHARGE:  02/29/2004                              NOCTURNAL POLYSOMNOGRAM    REFERRING PHYSICIAN:  Dr. Enid Baas   INDICATION FOR STUDY/HISTORY:  Hypersomnia with obstructive sleep apnea.  Loud snoring, waking with choking and cough, seizure disorder.  Prior sleep  study, February 27, 2002, with RDI of 17.5 per hour.  CPAP titrated then to 9  CWP.  She has not been using CPAP.  She did not complete Epworth sleepiness  scoring.  Neck size 17 inches.  BMI 55.6, weight 295 pounds.  Medications  listed as trazodone, Lipitor, Prilosec, Lamictal, desipramine, Xalatan,  Atrovent inhaler, Advair, Tylenol PM.   SLEEP ARCHITECTURE:  Total sleep time 413 minutes with sleep efficiency of  88%.  Stage 1 was 6%, stage 2 65%, stages 3 and 4 10%.  REM was absent.  Latency to sleep onset 15 minutes.  Latency to REM 91 minutes.  Wake after  sleep onset 40 minutes.  Arousal index 17 episodes per hour.   RESPIRATORY DATA:  Split night protocol.  Moderate obstructive sleep  apnea/hypopnea syndrome with RDI 24 per hour including 12 obstructive  apneas, 53 hypopneas before CPAP.  Events were not positional.  REM RDI was  25.4.  CPAP was titrated to 17 CWP, RDI of 6.1 per hour, using a medium  Respironics comfort full face mask with heated humidifier.   OXYGEN DATA:  Moderate snoring and mild to moderate oxygen desaturation  before CPAP with an oxygen saturation nadir of 72%.  Mean oxygen saturation  throughout the study was 90% at rest on room air with baseline oxygen  saturation while awake before study 93%.  She used her albuterol inhaler at  midnight for audible wheezing.   CARDIAC DATA:  Normal sinus rhythm without  significant ectopics reported.   MOVEMENT/PARASOMNIAS:  Forty-five body jerks were recorded of which 17 were  associated with sleep disturbance for a periodic limb movement with arousal  index of 2.5 per hour.  This is probably trivial in light of the degree of  sleep apnea but can be reassessed later if necessary.   IMPRESSION/RECOMMENDATION:  1. Moderate obstructive sleep apnea/hypopnea syndrome with good control on     CPAP at 17 CWP.  2. Mild periodic limb movement.  3. Mild to moderate oxygen desaturation with sleep apnea.                                  ______________________________                                Rennis Chris. Maple Hudson, M.D.  Diplomate, American Board of Sleep Medicine   CDY/MEDQ  D:  03/03/2004 15:11:59  T:  03/04/2004 13:14:45  Job:  [id number]

## 2010-12-27 NOTE — Discharge Summary (Signed)
[facility]  Patient:    [name], [name]                          MRN: [id number] Adm. Date:  [id number] Disc. Date: [id number] Attending:  Willow Ora CC:         [facility]   Discharge Summary  REASON FOR ADMISSION:         The patient is a 55 year old who presents for total vaginal hysterectomy and anterior colporrhaphy. Please see the dictated history and physical for further details.  HOSPITAL COURSE:              The patient was admitted, underwent total vaginal hysterectomy and anterior repair. Again, see the dictated operative summary for further details.  Her postoperative course was uneventful and she was discharged to home on postoperative day two, tolerating a regular diet and voiding without difficulty.  DISCHARGE DIAGNOSES:          1. Genuine stress urinary incontinence.                               2. Anterior vaginal wall defect.  PROCEDURE:                    Total vaginal hysterectomy and anterior                               colporrhaphy.  CONDITION:                    Good.  DIET:                         Regular.  ACTIVITY:                     No intercourse and no strenuous activity for six weeks.  DISPOSITION:                  The patient was to return to GYN Clinic in six weeks.  DISCHARGE MEDICATIONS:        Percocet one to two tabs p.o. q.4-6h. p.r.n. DD:  09/13/00 TD:  09/13/00 Job: [id number]

## 2010-12-27 NOTE — H&P (Signed)
[facility]  Patient:    [name], [name]                            MRN: [id number] Adm. Date:  06/12/00 Attending:  Roseanna Rainbow, M.D. CC:         Talmage Nap, M.D., [facility]             [facility]                         History and Physical  CHIEF COMPLAINT:              The patient is a 55 year old para 2 with dysfunctional uterine bleeding and genuine stress urinary incontinence with a cystourethrocele, who presents for total vaginal hysterectomy and anterior repair.  HISTORY OF PRESENT ILLNESS:   The patient has a long history of abnormal uterine bleeding.  Workup in the past has included dilatation and curettage and hysteroscopy.  Attempts have been made to cycle the patient on Prempro. The patient also gives a history of urinary incontinence, mostly associated with coughing or sneezing, heavy lifting or laughing.  Office cystometrics were performed today and are essentially normal.  She had leakage of urine that was demonstrated with Valsalva; her Q-tip test was positive as well.  She voided an unknown amount in approximately 20 seconds; postvoid residual was negligible, less than 10 cc.  This was sent for urinalysis and culture and sensitivity.  On filling the bladder, she felt her first urge to void at approximately 200 cc and an overwhelming urge to void to 300 cc.  She has a positive anal wink as well on exam.  ALLERGIES:                    BIAXIN, PENICILLIN.  MEDICATIONS:                  Trazodone, Prilosec, Atrovent.  PAST OB/GYN HISTORY:          She is status post two spontaneous vaginal deliveries, last delivery complicated by a postpartum deep venous thrombosis; she is also status post bilateral tubal ligation.  She denies any sexually transmitted diseases.  PAST MEDICAL HISTORY:         Remarkable for COPD, asthma, epilepsy, chronic headaches and GERD.  PAST SURGICAL HISTORY:         As above.  SOCIAL HISTORY:               Tobacco use.  FAMILY HISTORY:               Remarkable for thromboembolic event, lung cancer and emphysema.  PHYSICAL EXAMINATION  VITAL SIGNS:                  Pulse is 80.  Blood pressure 122/77.  Weight 268.4 pounds.  Height 5 feet 1 inch.  GENERAL APPEARANCE:           Obese.  HEENT:                        Normocephalic.  NECK:                         Supple.  LUNGS:  Rare expiratory wheezes.  HEART:                        Regular rate and rhythm.  ABDOMEN:                      Obese.  PELVIC:                       There is a cystourethrocele noted on Valsalva to approximately 2 cm from the introitus.  The cervix is well-supported.  There is a pseudo-rectocele with poor perineal body.  On speculum exam, the vagina is clean.  The uterus is midposition and likely normal size.  The adnexa are nonpalpable and nontender.  EXTREMITIES:                  No clubbing, cyanosis, or edema.  ASSESSMENT:                   1. Dysfunctional uterine bleeding likely                                  secondary to simple obesity.                               2. Stress urinary incontinence complicated by                                  obesity and chronic obstructive pulmonary                                  disease and an anterior pelvic floor defect.  PLAN:                         Total vaginal hysterectomy and anterior colporrhaphy with likely modified Kelly plication and possible perineorrhaphy. Will consider perioperative anticoagulant, i.e., low-molecular-weight heparin or heparin.  Will need preoperative clearance from Dr. Talmage Nap at the [facility]. DD:  06/12/00 TD:  06/12/00 Job: [id number]

## 2010-12-27 NOTE — Op Note (Signed)
NAME:  [name], [name]                             ACCOUNT NO.:  [id number]   MEDICAL RECORD NO.:  [id number]                   PATIENT TYPE:  AMB   LOCATION:  ENDO                                 FACILITY:  [facility]   PHYSICIAN:  Lina Sar, M.D. [facility]               DATE OF BIRTH:  [birth year]   DATE OF PROCEDURE:  04/13/2003  DATE OF DISCHARGE:                                 OPERATIVE REPORT   PROCEDURE:  Upper endoscopy and esophageal dilation.   INDICATIONS:  This 55 year old white female has complained of intermittent  solid and liquid food dysphagia.  She has been choking and coughing when she  eats.  This has been occurring for several months.  She has also gained 50  pounds and has been extremely overweight.  Her medications have included  Lipitor, aspirin, she has been also on trazodone for chronic headaches.  Her  stool is Hemoccult-negative.  She is undergoing upper endoscopy to assess  her dysphagia.   ENDOSCOPIST:  Olympus single-channel scope.   SEDATION:  Versed 5 mg IV, fentanyl 100 mcg IV.   FINDINGS:  Olympus single-channel video scope passed under direct vision  through the posterior pharynx to the esophagus.  The patient was monitored  by pulse oximeter.  Oxygen saturations were 94-95% on 2 L of nasal oxygen.  Proximal, mid-, and distal esophageal mucosa itself appeared normal.  There  was no Candida esophagitis, no reflux esophagitis.  There was no food in the  esophagus.  Vallecula and glottis were normal.  Squamocolumnar junction at  40 cm was normal with no evidence of stricture.  Endoscope traversed into  the stomach without resistance.  The lower esophageal sphincter area did not  appear to be spastic.   Stomach:  Stomach was insufflated with air and showed normal-appearing  gastric folds in antrum with patches of erythema in the prepyloric antrum.  Pyloric outlet was normal.  Retroflexion of endoscope revealed normal fundus  and cardia.  CLOtest was taken  from gastric antrum.   Duodenum:  Duodenal bulb and descending duodenum was normal.  Endoscope was  retracted and stomach decompressed.   Maloney dilator 83 [place] passed blindly without fluoroscopic guidance into  the stomach with minimal resistance.  There was no blood on the dilator.  The patient tolerated the procedure well.   IMPRESSION:  1. No evidence for esophageal stricture.  2. Status post passage of 30 French Maloney dilator.  3. Status post CLOtest.   PLAN:  1. The patient's dysphagia may be related to esophageal motility disorder.     This will be further evaluated with barium swallow with cine     esophagogram.  2.     Continue Protonix 40 mg daily.  3. The patient was encouraged to stop smoking.  4. Weight reduction.  Lina Sar, M.D. [facility]    DB/MEDQ  D:  04/13/2003  T:  04/13/2003  Job:  [id number]   cc:   Dr. Levester Fresh [facility]

## 2010-12-27 NOTE — Op Note (Signed)
[name], [name]                             ACCOUNT NO.:  [id number]   MEDICAL RECORD NO.:  [id number]                   PATIENT TYPE:  OIB   LOCATION:  5024                                 FACILITY:  [facility]   PHYSICIAN:  Deidre Ala, M.D.                 DATE OF BIRTH:  22-May-1956   DATE OF PROCEDURE:  04/21/2002  DATE OF DISCHARGE:                                 OPERATIVE REPORT   PREOPERATIVE DIAGNOSES:  Left knee degenerative medial meniscus tear with  possible patellofemoral arthritis.   POSTOPERATIVE DIAGNOSES:  1. Degenerative posterior horn medial meniscus tear.  2. Degenerative inner rim lateral meniscus.  3. 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  4. Large medial and lateral plicas.  5. Tight lateral retinaculum.   OPERATION PERFORMED:  1. Left knee operative arthroscopy with posterior medial meniscectomy.  2. Shaving degenerative inner rim lateral meniscus.  3. Abrasion chondroplasty and ablator medial femoral condyle, medial tibial     plateau, posterior patella, trochlea.  4. Medial and lateral plica excisions.  5. Arthroscopic lateral retinacular release.   SURGEON:  Bradley Ferris, M.D.   ASSISTANT:  Anise Salvo. Clark, P.A.-C.   ANESTHESIA:  General endotracheal.   CULTURES:  None.   DRAINS:  None.   ESTIMATED BLOOD LOSS:  Minimal.   TOURNIQUET TIME:  45 minutes.   PATHOLOGIC FINDINGS AND HISTORY:  The patient is a 55 year old 100 pound  overweight individual who has asthma and smokes.  She was therefore done as  an inpatient with overnight observation.  She has sleep apnea and had a  recent sleep study and will be placed on CPAP overnight.  She has had  progressive knee pain referred by Dalbert Mayotte, MD of [facility]  for low back and knee pain.  Both knees hurt but the left side was worse.  We treated her  with Cortisone injections and other conservative management.  She had catching, locking and giving way at the medial joint line and we  elected to proceed with arthroscopy.  At surgery, we found a degenerative  posterior horn medial meniscal tear, degenerative inner rim lateral  meniscus, degenerative joint disease grade 3 to 4 medial femoral condyle,  medial tibial plateau, posterior patella, about a dime-sized area of  trochlea to bone and the lateral patellar facet.  Marked intense  synovitis,  medial and lateral sides a very tight lateral retinaculum and an intact ACL.  This was all debrided and a lateral retinacular release carried out.   DESCRIPTION OF PROCEDURE:  With adequate anesthesia obtained using  endotracheal technique, 300 mg Cleocin given IV prophylaxis, the patient was  placed in the supine position, the left lower extremity was prepped from the  malleoli to  the leg holder in the standard fashion.  After standard prepping  and draping, Esmarch exsanguination was used and the tourniquet was let up  to 400 mmHg.  Superior lateral inflow portal was made and the knee was  insufflated with normal saline with arthroscopic pump.  Medial and lateral  scope portals were then made and the joint was thoroughly inspected.  I then  shaved out the medial plica and lysed the medial band.  I then opened up the  medial compartment and with a combination of basket shaver, saucerized the  medial meniscus tear, posterior horn tapering it to the stable anterior horn  and shaved the medial femoral condyle, medial femoral plateau of its loose  cartilage.  I then reversed the portals and shaved out the inner rim lateral  meniscal tear which was significant, the main body, however, being intact.  I then shaved the posterior patella and used the ablator on 1 to smooth as  well as the trochlear groove, shaved out the lateral plica and I had  previously used the ablator to smooth down the medial  femoral condyle.  I  then assessed the tracking and tilt and then an arthroscopic lateral  retinacular release from vastus lateralis to the joint line using the  ArthroCare hook.  Improved tracking and tilt and pressurization of the  lateral facet was noted.  The knee was then irrigated through the scope.  0.5% Marcaine injected in and about the portals.  The portals were left  open.  A bulky sterile compressive dressing was applied with lateral foam  pad for tamponade and E-Z wrap placed.  The patient having tolerated the  procedure well was awakened and taken to the recovery room in satisfactory  condition to be admitted for overnight observation and CPAP.  Told to call  the office for appointment for recheck on discharge tomorrow probably on  Saturday.                                               Deidre Ala, M.D.    VEP/MEDQ  D:  04/28/2002  T:  04/29/2002  Job:  [id number]   cc:   Dalbert Mayotte, MD  [facility]  [place], [place] [address]  Fax: [phone]

## 2010-12-27 NOTE — Discharge Summary (Signed)
[name], [name]                   ACCOUNT NO.:  [id number]   MEDICAL RECORD NO.:  [id number]          PATIENT TYPE:  INP   LOCATION:  6738                         FACILITY:  [facility]   PHYSICIAN:  Devra Dopp, MD     DATE OF BIRTH:  1955/11/20   DATE OF ADMISSION:  08/15/2004  DATE OF DISCHARGE:                                 DISCHARGE SUMMARY   DISCHARGE DIAGNOSES:  1.  Chronic obstructive pulmonary disease exacerbation.  2.  Obesity induced sleep apnea.  3.  Tobacco use.  4.  Gastroesophageal reflux disease.   DISCHARGE MEDICATIONS:  1.  Prednisone Sterapred 30 to 10 mg taper over 12 days.  2.  Albuterol inhaler - the patient weaning to two puffs q.6h for two days,      then two puffs q.8h for two days and then as needed.  3.  Atrovent two puffs q.i.d.  4.  Flovent two puffs b.i.d.  5.  Aspirin 81 mg daily.  6.  Lipitor 10 mg daily.  7.  Prilosec daily.  8.  Lamictal daily.  9.  Trazodone three times daily.  10. Nicotine patch 14 mg one new patch every 24 hours.   DIET:  The patient is to follow a cholesterol diet.   SPECIAL INSTRUCTIONS:  1.  At completion of the prednisone taper, the patient's Flovent is to be      increased.  2.  The patient will need follow-up for her smoking cessation.  3.  Follow-up with Dr. Olene Floss on Monday, January 9 at 10:45 a.m.   HOSPITAL COURSE:  This is a 55 year old white female with a history of COPD  that was managed as an outpatient for approximately the last week for a COPD  exacerbation.  She had completed a five day course of Avelox and eight of  ten days of prednisone.  The patient was requiring approximately two to  three hour use of her Albuterol and was complaining of shortness of breath  as well as pleuritic chest pain.  Given that she had no improvement as an  outpatient, she was admitted to [facility]  family practice  teaching service where she was treated to improvement.  1.  Chronic obstructive pulmonary disease  exacerbation.  The patient was      started on Solu-Medrol 125 mg q.6h while in the hospital.  Additionally,      the patient was given Albuterol nebulizers scheduled as well as      Albuterol nebulizers p.r.n.  Antibiotics were not started as the patient      had completed a regular course and this was not thought to be a      pneumonia as there was no hypoxia and related to the patient's COPD      exacerbation and the fact that she continues to be a smoker despite her      respiratory issues.  However, the patient has expressed a willingness to      quit. On the day of discharge the patient was saturating 96% on room  air.  She continued to have some wheezes throughout, left greater than      right, but reported decreased shortness of breath.  She was continuing      with some cough and some cough productive of sputum.  2.  Obesity induced sleep apnea.  The patient continued her BiPAP in the      evening while in the hospital and says that she will continue this at      home.  Her regular physician has been in contact with her regarding the      importance of weight loss for resolution of this problem.  3.  Tobacco abuse. As stated before, the patient is willing to quit.  She      does have a history of seizure disorder.  Therefore, she will likely not      be started on Zyban at this time. She is amenable, however, to using the      patch and we will let her primary care physician, Dr. Olene Floss, follow-up      with her for further management of her smoking cessation.  4.  Gastroesophageal reflux disease.  The patient was continued on Prilosec      and will be continued on this at home.  5.  Hypercholesterolemia.  The patient continued on her home dose of      Lipitor.   LABORATORY DATA:  Discharged laboratories:  White count 8.4, hemoglobin  12.9, hematocrit 38.3, platelets 204,000.  Sodium 140, potassium 3.8,  chloride 105, bicarbonate 26, glucose 210, BUN 8, creatinine 0.9,  calcium  9.0.  Arterial blood gas showed a pH of 7.396, a pC02 of 43.4, a p02 of  65.5, a bicarbonate of 25.5, acid base excess of 1.7 and 02 saturation of  92.4.       TH/MEDQ  D:  08/16/2004  T:  08/16/2004  Job:  [id number]   cc:   Barney Drain, M.D.  [facility].  [facility] Resident  [place]  [place] [address]  Fax: [phone]

## 2010-12-27 NOTE — H&P (Signed)
[name], [name]                   ACCOUNT NO.:  [id number]   MEDICAL RECORD NO.:  [id number]          PATIENT TYPE:  INP   LOCATION:  2831                         FACILITY:  [facility]   PHYSICIAN:  Melina Fiddler, MD DATE OF BIRTH:  04/13/1956   DATE OF ADMISSION:  08/15/2004  DATE OF DISCHARGE:                                HISTORY & PHYSICAL   CHIEF COMPLAINT:  Shortness of breath.   HISTORY OF PRESENT ILLNESS:  [name] is a 55 year old female with a history  of COPD and tobacco use who presents to family practice clinic today  complaining of persistent shortness of breath and productive cough.  She was  evaluated on December 28 for similar symptoms and diagnosed with COPD  exacerbation.  She is status post five days of Avelox and day eight of  prednisone burst.  She is requiring regular use of her Albuterol MDI, her  last use was at 0600 this morning.  Eating and normal conversation are  difficult secondary to her severe shortness of breath.  She has not had any  temperature, chills, sweats, rhinitis, or sore throat.  She has multiple  sick contacts at home.  She describes associated pleuritic chest pain,  malaise, and fatigue.  No improvement with outpatient therapy.   PAST MEDICAL HISTORY:  1.  COPD, PEEP was typically between 250 and 350.  2.  Tobacco use.  3.  Depression.  4.  Gastroesophageal reflux disease with hiatal hernia.  5.  Hyperlipidemia.  6.  Osteoarthritis.  7.  Stress urinary incontinence.  8.  Seizure disorder.  9.  Obstructive sleep apnea requiring CPAP use at home.  10. History of stage III melanoma status post excision in July 2005.   PAST SURGICAL HISTORY:  1.  Bilateral tubal ligation in December 1985.  2.  EGD in July 1997 showed antral gastritis.  3.  Left knee arthroscopy in December 2003 which revealed a left meniscal      tear requiring a medial and lateral retinacular release.  4.  Excisional biopsy of a nevus, 8 by 6 mm, in the right upper  extremity in      June 2005.  5.  Sleep study showing moderate obstructive sleep apnea.  6.  Cardiolite September 2005 revealed no ischemia and an ejection fraction      of 77%.   MEDICATIONS:  1.  Flovent 44 mcg 2 puffs b.i.d.  2.  Atrovent MDI q.i.d.  3.  Albuterol MDI p.r.n.  4.  Aspirin 81 mg daily.  5.  Desipramine 50 mg q.h.s.  6.  Lamictal 100 mg b.i.d.  7.  Lipitor 10 mg daily.  8.  Mobic 15 mg daily.  9.  Prilosec OTC b.i.d.  10. Trazodone 150 mg t.i.d. p.r.n. headache.   ALLERGIES:  Penicillin and sulfa.  Intolerances to Biaxin and Advair.   SOCIAL HISTORY:  The patient lives with her husband and two sons, she is  unemployed and smokes one pack a day for the majority of her adult life.   FAMILY HISTORY:  Concern for Becker's muscular  dystrophy in the patient's  father.  She has no first degree family history of coronary artery disease  or diabetes, hypertension.   REVIEW OF SYMPTOMS:  Seven system review of systems otherwise negative.   PHYSICAL EXAMINATION:  GENERAL:  Well cared for, mildly uncomfortable female, morbidly obese.  VITAL SIGNS:  Temperature 97, heart rate 85, blood pressure 142/72, O2  saturation 97% on room air, respiratory rate 30.  HEENT:  Normocephalic, atraumatic, mucosal membranes pink and moist,  oropharynx clear, dentition fair.  NECK:  No thyromegaly or masses.  LYMPHATICS:  No palpable anterior cervical, occipital, or axillary  lymphadenopathy appreciated.  CARDIOVASCULAR:  Normal precordium, S1 and S2, regular rate and rhythm, no  ectopy appreciated, 2/2 radial and dorsal pedal pulses symmetric  bilaterally.  LUNGS:  Poor air exchange, no accessory muscle use, no retractions,  pronounced expiratory wheeze diffusely, no crackles or rhonchi.  Peak flows  165, 170, 180.  ABDOMEN:  Obese, nondistended, normal active bowel sounds, soft, nontender,  no hepatosplenomegaly or masses.  EXTREMITIES:  No peripheral erythema or edema.  SKIN:  No  rashes or jaundice.  NEUROLOGICAL:  Grossly intact.   LABORATORY DATA:  Pending.   ASSESSMENT/PLAN:  55 year old female with COPD exacerbation that has failed  outpatient therapy, therefore, requiring hospitalization.   1.  COPD exacerbation.  Will check and ABG and chest x-ray.  Only provide      supplemental oxygen for O2 saturation less than 92%.  Will provide Solu-      Medrol 125 mg q.6h. for the first 24 hours then daily.  Hope to      transition to oral prednisone once the patient's symptoms improve.      Doubt the patient has pneumonia as she is not hypoxic.  The patient has      completed a five day course of Avelox, therefore, will hold further      antibiotic therapy.  Will increase her Flovent to 110 mcg b.i.d. at      discharge.  Will change Atrovent MDI to Spiriva also at discharge.  The      patient will benefit from smoking cessation, continue scheduled      Albuterol and Atrovent nebulizers q.6h. with p.r.n. Albuterol.  2.  Obstructive sleep apnea.  Weight loss has been a difficult issue for the      patient.  Will continue to address as an outpatient.  Continue CPAP at      night.  3.  Tobacco use.  The patient is currently in contemplation stage.  Will      provide nicotine 7 mg transdermal daily and request a consult for      smoking cessation.  4.  Elevated blood pressure.  The patient is usually normotensive as an      outpatient, will follow after discharge.  5.  Seizure disorder, stable, continue Lamictal.  6.  GERD, continue Prilosec.  7.  Stress incontinence, continue Desipramine each night.  8.  Hypercholesterolemia, continue Lipitor.  9.  Discharge planning, anticipate return to independent living after      resolution of COPD exacerbation.       SS/MEDQ  D:  08/15/2004  T:  08/15/2004  Job:  [id number]

## 2010-12-27 NOTE — Discharge Summary (Signed)
[place]  Patient:    [name], [name]                          MRN: [id number] Adm. Date:  [id number] Disc. Date: [id number] Attending:  Asencion Partridge L Dictator:   Luna Kitchens, M.D. CC:         Talmage Nap, M.D.   Discharge Summary  DISCHARGE DIAGNOSES: 1. Pneumonia. 2. Urinary tract infection. 3. Chronic obstructive pulmonary disease. 4. Anemia of chronic blood loss. 5. History of tobacco abuse. 6. Clostridium difficile colitis. 7. Hyponatremia.  DISCHARGE MEDICATIONS: 1. Aspirin 81 mg p.o. q.d. 2. Atrovent MDI 2 puffs q.i.d. 3. Claritin-D 10 mg p.o. q.d. 4. Prilosec 20 mg p.o. q.d. 5. Proventil MDI 2 puffs q.4-6h. p.r.n. 6. Trazodone 150 mg q.i.d. 7. Vanceril 2 puffs t.i.d. 8. Metronidazole 500 mg t.i.d. x 10 days. 9. Tequin 400 mg q.d. x 6 days.  HISTORY OF PRESENT ILLNESS:  Briefly, [name] is a 55 year old white female who presented to the emergency room complaining of fever, chills, increased work of breathing, and dyspnea on exertion.  She also complained of lower abdominal pain with a 24-hour history of watery stools.  She also complained that she was having to strain to urinate.  The patient was six days postoperative from a partial vaginal hysterectomy and bladder tack.  She was febrile in the emergency room with a temperature of 102.2.  Pulse oximetry was 93% on room air and 97% on 2 L nasal cannula.  White blood count was elevated at 20.4, with 90% neutrophils.  The patient was admitted for fever of unclear etiology.  HOSPITAL COURSE: #1 - PNEUMONIA:  Chest x-ray revealed bilateral patchy infiltrates.  This progressed to an increased left upper lobe infiltrate with no change in the faint right upper lobe infiltrate.  The patient had been started on Tequin antibiotic empirically, to which she responded well.  This was continued for a total course of 10 days.  #2 - URINARY TRACT INFECTION:  Urinalysis  revealed cloudy urine with a specific gravity of 1.025, large hemoglobin, moderate leukocyte esterase, with 11-20 white blood cells, and 11-20 red blood cells.  Culture grew greater than 100,000 colonies with multiple species present but no uropathogens isolated. The patients symptom of difficulty urinating resolved during her hospitalization after treatment with Tequin and the infection to be treated by the full course of this antibiotic.  #3 - CLOSTRIDIUM DIFFICILE COLITIS:  The patient continued to have watery stools throughout her admission.  One out of three stool specimens came back as positive for C. difficile toxin.  The patient was started on metronidazole and to continue a 10-day course of this.  #4 - CHRONIC OBSTRUCTIVE PULMONARY DISEASE:  Her O2 saturations remained in the low 90s on room air.  The patient continued albuterol and Atrovent nebulizer treatments every six hours the first day.  She was then switched to MDIs.  She should continue the Atrovent at home and the Proventil as needed.  #5 - ANEMIA:  The patients hemoglobin on admission was 10.6.  This fell to 9.5 by the next day.  It remained stable, ranging between 9.3 and 9.5, with hematocrit of 26.7-29.0.  Reticulocyte count was low at 3.27, with reticulocyte percentage of 0.7.  The patients anemia was felt to be due to her chronic vaginal blood loss.  She may have a component of iron deficiency. This should be worked up and  followed as an outpatient.  #6 - HYPONATREMIA:  Sodium ranged between 131 and 132 during the first few days of her admission.  [name] was drinking a large amount of water secondary to mouth pain from ulcers on her tongue.  Once she was switched to isotonic fluids and the ulcers were treated with Magic Mouthwash, her sodium normalized to 136.  DISCHARGE INSTRUCTIONS:  The patient is to call or return to the clinic or emergency room if she develops high fever, difficulty breathing, or  worsening pain.  FOLLOW-UP:  She has an appointment scheduled with Dr. Jolinda Croak at the family practice center for Friday, August 14, 2000, at 1:30 p.m. DD:  09/29/00 TD:  09/30/00 Job: [id number]

## 2010-12-27 NOTE — Op Note (Signed)
[facility]  Patient:    [name], [name]                          MRN: [id number] Proc. Date: 07/27/00 Adm. 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  OPERATION:                    1. Total vaginal hysterectomy                               2. Culdoplasty.                               3. Midline cystocele repair.  SURGEON:                      Roseanna Rainbow, M.D.                               Donney Rankins, M.D.                               Charles A. Clearance Coots, M.D.  ANESTHESIA:                   General endotracheal anesthesia.  COMPLICATIONS:                None.  ESTIMATED BLOOD LOSS:         350 cc.  FLUIDS:                       500 cc lactated ringers.   FINDINGS OF EXAMINATION UNDER ANESTHESIA:  Small anteverted uterus with elongated hypertrophic cervix.  There was a mild cystocele with descent of the bladder neck.  OPERATIVE FINDINGS:           Small uterus, normal tubes, and ovaries not well visualized.  DESCRIPTION OF PROCEDURE:     The risks, benefits, indications, and alternatives of the procedure were reviewed with the patient, and informed consent was obtained.  She was taken to the operating room with IV running. The patient was placed in the dorsal lithotomy position and prepped and draped in the usual sterile fashion.  A weighted speculum was placed in the vagina and the cervix grasped with a Jacobs tenaculum.  The cervix was then injected anteriorly with 1% lidocaine with 1:200,000 epinephrine.  The cervix was then incised anteriorly with the scalpel, and the bladder was dissected off the pubovesical  cervical fascia anteriorly with Metzenbaum scissors.  The anterior cul-de-sac was then entered sharply.  The same procedure  was performed posteriorly, and the posterior cul-de-sac was entered sharply without difficulty.  The uterosacral ligaments on either side were clamped with Heaney clamps, transected, and suture ligated.  Hemostasis was ensured.  The cardinal ligaments were then clamped on both sides, transected, and suture ligated in similar fashion.  The uterine arteries were then serially clamped with Heaney clamps, transected, and suture ligated on both sides.  At this point, the fundus was then delivered anteriorly, and a Doderlein procedure followed.  Both cornu were clamped with Heaney clamps, transected, and these pedicles were suture ligated with excellent hemostasis.  The broad ligaments were then clamped with Heaney clamps, transected, and suture ligated with excellent hemostasis. The remainder of the cardinal ligament complexes were then doubly clamped on both sides and suture ligated in a similar fashion.  The uterus and cervix were delivered.  The cul-de-sac was then inspected and found to be redundant with an enterocele.  The space between the peritoneum and enterocele sac was then undermined.  A McCall culdoplasty was then placed reefing the uterosacral ligaments and the peritoneum of the cul-de-sac for one suture, and then the suture entering the vaginal wall in the midline posteriorly, and then grabbing the uterosacral ligaments and then again reefing across the peritoneum of the cul-de-sac to the other uterosacral ligament and then exiting through the vagina posteriorly in the midline.  These sutures were then tied.  At this point, the pedicles were inspected for hemostasis.  The vagina was then incised anteriorly to the level of approximately the urethra.  Sharp dissection was used to dissect the bladder off of the vaginal wall.  The pubourethrovaginal ligaments were  then plicated in the midline using horizontal mattress sutures.  The redundant vaginal tissue was then excised, and the vaginal incision was reapproximated.  The remainder of the cuff was closed with figure-of-eight sutures.  All instruments were removed from the vagina and the patient taken out of the dorsolithotomy position and awakened from general anesthesia.  She was taken to the PACU in stable condition. Sponge, lap, needle, and instruments counts were correct x 2.  PATHOLOGY:                    Uterus and cervix. DD:  07/27/00 TD:  07/27/00 Job: [id number]

## 2011-01-13 ENCOUNTER — Telehealth: Payer: Self-pay | Admitting: Family Medicine

## 2011-01-13 ENCOUNTER — Encounter: Payer: Self-pay | Admitting: Family Medicine

## 2011-01-13 ENCOUNTER — Ambulatory Visit (INDEPENDENT_AMBULATORY_CARE_PROVIDER_SITE_OTHER): Payer: Medicaid Other | Admitting: Family Medicine

## 2011-01-13 VITALS — BP 92/63 | HR 81 | Temp 97.9°F

## 2011-01-13 DIAGNOSIS — J449 Chronic obstructive pulmonary disease, unspecified: Secondary | ICD-10-CM

## 2011-01-13 MED ORDER — PREDNISONE 20 MG PO TABS: 60.0000 mg | ORAL_TABLET | Freq: Every day | ORAL | Status: AC

## 2011-01-13 MED ORDER — ALBUTEROL SULFATE (2.5 MG/3ML) 0.083% IN NEBU
2.5000 mg | INHALATION_SOLUTION | Freq: Once | RESPIRATORY_TRACT | Status: AC
  Administered 2011-01-13: 2.5 mg via RESPIRATORY_TRACT

## 2011-01-13 MED ORDER — IPRATROPIUM BROMIDE 0.02 % IN SOLN
0.5000 mg | Freq: Once | RESPIRATORY_TRACT | Status: AC
  Administered 2011-01-13: 0.5 mg via RESPIRATORY_TRACT

## 2011-01-13 MED ORDER — MOXIFLOXACIN HCL 400 MG PO TABS: 400.0000 mg | ORAL_TABLET | Freq: Every day | ORAL | Status: AC

## 2011-01-13 NOTE — Assessment & Plan Note (Addendum)
Acute exacerbation. Albuterol / Atrovent nebulizer x 1 in office. Will treat with Avelox (given allergy history) and prednisone. Follow up with PCP one month. History of mouth ulcers with chronic inhaled glucocorticoids - if worsening frequency of exacerbations could consider restarting and reviewing oral precautions for chronic inhaled steroid use. Red flags reviewed. Advised to quit smoking - pre-contemplative.

## 2011-01-13 NOTE — Telephone Encounter (Signed)
Got avelox for COPD.  Now itchy since this PM.  Some hives on legs.  No trouble breathing.  No swelling in face or tongue.  Recommended benedryl.  Gave red flags for ED.  D/c this medication and come to see [facility] in AM

## 2011-01-13 NOTE — Progress Notes (Signed)
  Subjective:    Patient ID: [name], female    DOB: 1955/11/05, 55 y.o.   MRN: [id number]  HPI  1) Cough: Cough with green sputum (change from baseline in volume and purulence) over past 3 days. Increased wheezing and dyspnea - has been using albuterol inhaler BID (has not used today). Denies nasal congestion, sore throat, fever, chills, emesis, diarrhea, lethargy, decreased oral intake, sick contact, syncope.   History of COPD. Current smoking 1 pack per day. Smoking throughout this illness. Last COPD exacerbation was about 2 months ago. No hospitalizations in past calendar year. Multiple antibiotic allergies reviewed as above.   Pertinent past history reviewed   Review of Systems As per HPI     Objective:   Physical Exam Vitals reviewed General: pleasant, no acute distress,  Mouth: moist membranes without erythema or exudate  Neck: no LAD  Heart: regular rate and rhythm Lungs: No accessory muscles use, comfortable breathing, able to complete sentences on room air. Poor air movement bilaterally with occasional end expiratory wheeze, but no crackles noted - improved air movement bilaterally after treatment with resolution of wheeze - still no crackles or rales noted  Skin: no cyanosis; skin warm and dry      Assessment & Plan:

## 2011-01-13 NOTE — Patient Instructions (Signed)
Follow up in one month with your regular doctor. You need to think about quitting smoking - you should try to stop during this acute illness.  Take the Avelox antibiotic as directed Take the prednisone steroid pills as directed  If you are feeling worse give [place] a call or come in. I hope you feel better!  - Dr. Wallene Huh

## 2011-01-15 ENCOUNTER — Ambulatory Visit: Payer: Medicaid Other | Admitting: Psychology

## 2011-01-31 ENCOUNTER — Encounter: Payer: Self-pay | Admitting: Family Medicine

## 2011-01-31 ENCOUNTER — Ambulatory Visit (INDEPENDENT_AMBULATORY_CARE_PROVIDER_SITE_OTHER): Payer: Medicaid Other | Admitting: Family Medicine

## 2011-01-31 VITALS — BP 125/73 | HR 85 | Temp 98.2°F | Ht 60.0 in | Wt 182.0 lb

## 2011-01-31 DIAGNOSIS — E785 Hyperlipidemia, unspecified: Secondary | ICD-10-CM

## 2011-01-31 DIAGNOSIS — G43909 Migraine, unspecified, not intractable, without status migrainosus: Secondary | ICD-10-CM

## 2011-01-31 DIAGNOSIS — G43019 Migraine without aura, intractable, without status migrainosus: Secondary | ICD-10-CM

## 2011-01-31 DIAGNOSIS — E119 Type 2 diabetes mellitus without complications: Secondary | ICD-10-CM

## 2011-01-31 LAB — POCT GLYCOSYLATED HEMOGLOBIN (HGB A1C): Hemoglobin A1C: 5.3

## 2011-01-31 MED ORDER — DEXAMETHASONE SODIUM PHOSPHATE 4 MG/ML IJ SOLN
4.0000 mg | Freq: Once | INTRAMUSCULAR | Status: AC
  Administered 2011-01-31: 4 mg via INTRAMUSCULAR

## 2011-01-31 MED ORDER — KETOROLAC TROMETHAMINE 60 MG/2ML IM SOLN
60.0000 mg | Freq: Once | INTRAMUSCULAR | Status: AC
  Administered 2011-01-31: 60 mg via INTRAMUSCULAR

## 2011-01-31 MED ORDER — PROMETHAZINE HCL 25 MG/ML IJ SOLN
25.0000 mg | Freq: Once | INTRAMUSCULAR | Status: AC
  Administered 2011-01-31: 25 mg via INTRAMUSCULAR

## 2011-01-31 NOTE — Progress Notes (Signed)
  Subjective:    Patient ID: [name], female    DOB: Jun 18, 1956, 55 y.o.   MRN: [id number]  HPI Migraine- typical migraine wants shot todays, now has neck pain down into back because of headache-- +photophobia, , nausea  Hyperlidiemia-taking  Simvastatin , needs CMET today  Diabetes- takes blood sugar 2 hours after she eats supper at night, highest 200 , usually between 120- 160, diet controlled, no meds     Review of Systems per above     Objective:   Physical Exam      GEN- NAD, alert and oriented         HEENT- PERRL           Neck- normal ROM      CVS-RRR, no murmur      RESP- CTAB      Ext- normal sensation in feet, normal monofilament testing, No edema    Assessment & Plan:

## 2011-01-31 NOTE — Patient Instructions (Addendum)
Continue your current medications We will notify you of your labs next week Try the over the counter wart removal medication for your toe I encourage you to quit smoking Make an appt with your new doctor in 3 months

## 2011-02-01 LAB — COMPREHENSIVE METABOLIC PANEL
ALT: <8 U/L (ref 0–35)
AST: 9 U/L (ref 0–37)
Creat: 0.91 mg/dL (ref 0.50–1.10)
Sodium: 143 mEq/L (ref 135–145)
Total Bilirubin: 0.2 mg/dL — ABNORMAL LOW (ref 0.3–1.2)
Total Protein: 7.2 g/dL (ref 6.0–8.3)

## 2011-02-02 NOTE — Assessment & Plan Note (Signed)
Given abortive treatment, pt still has not seen HA specialist and highly doubtful she will be able to do so in the near future Phenergan, Dexamethasone, Toradol 60mg 

## 2011-02-02 NOTE — Assessment & Plan Note (Signed)
A1C less than 6, diet controlled

## 2011-02-03 ENCOUNTER — Encounter: Payer: Self-pay | Admitting: Family Medicine

## 2011-02-03 ENCOUNTER — Ambulatory Visit: Payer: Medicaid Other | Admitting: Psychology

## 2011-02-04 ENCOUNTER — Encounter: Payer: Self-pay | Admitting: Family Medicine

## 2011-02-10 ENCOUNTER — Other Ambulatory Visit: Payer: Self-pay | Admitting: Family Medicine

## 2011-02-10 DIAGNOSIS — Z1231 Encounter for screening mammogram for malignant neoplasm of breast: Secondary | ICD-10-CM

## 2011-02-18 ENCOUNTER — Ambulatory Visit: Payer: Medicaid Other

## 2011-02-27 ENCOUNTER — Ambulatory Visit: Payer: Medicaid Other | Admitting: Psychology

## 2011-03-18 ENCOUNTER — Other Ambulatory Visit: Payer: Self-pay | Admitting: Family Medicine

## 2011-03-19 ENCOUNTER — Ambulatory Visit (INDEPENDENT_AMBULATORY_CARE_PROVIDER_SITE_OTHER): Payer: Medicaid Other | Admitting: Psychology

## 2011-03-19 DIAGNOSIS — F411 Generalized anxiety disorder: Secondary | ICD-10-CM

## 2011-03-19 NOTE — Assessment & Plan Note (Signed)
Report of mood is sad.  Affect is within normal limits.  She smells of cigarettes.  Thoughts are clear and goal directed.  Not sure what she hoped to get out of the visit.  She has said in the past that it is helpful to talk but she cancels appointments regularly.  With regards to decreasing anxiety, the behavioral changes that would help are ones that she is not willing to do.  She sets a lot of limits with the grandchildren but is not good at setting them with the adults in her home.  Multiple barriers.  She has long had anxiety about death.  Her report of her aunt's condition was not as distressed as I thought it might be.    Asked if she would like to call to schedule or schedule something today.  She said she would call.  Will follow as she desires.

## 2011-03-19 NOTE — Progress Notes (Signed)
[name] presents for her third therapy appointment.  One was three months ago and the other a year before that.  The issues remain the same.  Her big stressors are death and the people in her home.  She reported her favorite aunt is in a [employer] hospital dying.  She is sad about this.    She continues to live with her husband, her son who is on disability for Bipolar Disorder and muscular dystrophy, [name], her two children (one of whom is [name]'s biological grandson) and now her son's current girlfriend and her 74 year old.  She reports she can't kick her son out because his rent is what allows them to keep the house.  She can't kick his new girlfriend out because her son pays rent to live there.  She won't ask [name] to leave because this would mean her grandchildren going to a shelter.  She continues to smoke a pack of cigarettes per day and has no intention of quitting.  She says her nerves are too bad.  She has Xanax but she does not like to take it for fear she will get addicted.  She takes it when she really feels like she is going to "lose it."

## 2011-04-16 ENCOUNTER — Encounter: Payer: Self-pay | Admitting: Family Medicine

## 2011-04-16 ENCOUNTER — Ambulatory Visit (INDEPENDENT_AMBULATORY_CARE_PROVIDER_SITE_OTHER): Payer: Medicaid Other | Admitting: Family Medicine

## 2011-04-16 VITALS — BP 122/80 | HR 80 | Temp 98.5°F | Wt 186.0 lb

## 2011-04-16 DIAGNOSIS — G43019 Migraine without aura, intractable, without status migrainosus: Secondary | ICD-10-CM

## 2011-04-16 DIAGNOSIS — R3989 Other symptoms and signs involving the genitourinary system: Secondary | ICD-10-CM

## 2011-04-16 DIAGNOSIS — R39198 Other difficulties with micturition: Secondary | ICD-10-CM

## 2011-04-16 DIAGNOSIS — R300 Dysuria: Secondary | ICD-10-CM | POA: Insufficient documentation

## 2011-04-16 LAB — POCT URINALYSIS DIPSTICK
Blood, UA: NEGATIVE
Glucose, UA: NEGATIVE
Nitrite, UA: NEGATIVE
Spec Grav, UA: >=1.03
pH, UA: 6

## 2011-04-16 MED ORDER — KETOROLAC TROMETHAMINE 30 MG/ML IJ SOLN
30.0000 mg | Freq: Once | INTRAMUSCULAR | Status: AC
  Administered 2011-04-16: 30 mg via INTRAMUSCULAR

## 2011-04-16 MED ORDER — PROMETHAZINE HCL 25 MG/ML IJ SOLN
25.0000 mg | Freq: Once | INTRAMUSCULAR | Status: AC
  Administered 2011-04-16: 25 mg via INTRAMUSCULAR

## 2011-04-17 MED ORDER — SIMVASTATIN 40 MG PO TABS: 40.0000 mg | ORAL_TABLET | Freq: Every day | ORAL | Status: DC

## 2011-04-17 NOTE — Progress Notes (Signed)
  Subjective:    Patient ID: [name], female    DOB: 28-Dec-1955, 55 y.o.   MRN: [id number]  HPI 1.  Difficulty urinating:  Has had increased difficulty with urination over the past few days.  Feels like she has to strain to pee, thinks she may have a UTI.  She did have some dysuria a little over a week ago but nothing recently.  No odor to her urine.  She denies any abdominal pain or feeling of bladder fullness. Has not noticed blood in her urine.  Denies fever/chills/ flank pain.   2. Migraine:  Has had migraines off and on for many years.  This most recent one she states has been going on for 1 week. On topamax for preventative treatment of migraines.  Does still get occasional breakthrough migraines. Followed by neurology.  She has been to a headache specialist in the past but she has not been able to afford to go back, does not think she will return.  She typically gets an injection at the [facility] which breaks her migraines for a few months.  This headache does feel like her typical migraine.  She denies any numbness/weakness anywhere   Review of Systems     Objective:   Physical Exam  Constitutional: She appears well-developed and well-nourished.       Mildly uncomfortable appearing.   Neck: Normal range of motion.  Cardiovascular: Normal rate, regular rhythm and normal heart sounds.   Pulmonary/Chest: Effort normal and breath sounds normal.  Abdominal: Soft. Bowel sounds are normal. She exhibits no distension. There is no tenderness.       No cva tenderness or surpapubic tenderness.  NO palpably bladder dome.  Neurological: She is alert. No cranial nerve deficit or sensory deficit.          Assessment & Plan:

## 2011-04-18 LAB — URINE CULTURE

## 2011-04-27 DIAGNOSIS — R39198 Other difficulties with micturition: Secondary | ICD-10-CM | POA: Insufficient documentation

## 2011-04-27 NOTE — Assessment & Plan Note (Signed)
UA does not look to be UTI.  Will send for culture.  Will hold on abx until culture returns.  Does not seem to be on any medications that would cause urinary retention.  Encouraged to increase fluid intake to see if this improves her outuput.  If not improving return in 1-2 weeks.

## 2011-04-27 NOTE — Assessment & Plan Note (Signed)
On topamax for preventative treatment of migraines.  Does still get occasional breakthrough migraines.  Will give abortive treatment of toradol and phenergan for now. No neurological deficits of red flags at this time

## 2011-04-28 ENCOUNTER — Encounter: Payer: Self-pay | Admitting: Family Medicine

## 2011-05-07 LAB — POCT CARDIAC MARKERS
CKMB, poc: <1 — ABNORMAL LOW
Myoglobin, poc: 42.8
Myoglobin, poc: 47.5
Myoglobin, poc: 48.1
Operator id: 277751
Operator id: 277751
Troponin i, poc: <0.05

## 2011-05-07 LAB — TROPONIN I: Troponin I: 0.01

## 2011-05-07 LAB — COMPREHENSIVE METABOLIC PANEL
Albumin: 3.8
BUN: 12
Chloride: 109
Creatinine, Ser: 0.77
Glucose, Bld: 112 — ABNORMAL HIGH
Total Bilirubin: 0.3
Total Protein: 6.5

## 2011-05-07 LAB — CBC
HCT: 38
Hemoglobin: 12.9
MCV: 96
Platelets: 279
RDW: 12.5

## 2011-05-07 LAB — DIFFERENTIAL
Basophils Absolute: 0.3 — ABNORMAL HIGH
Basophils Relative: 2 — ABNORMAL HIGH
Lymphocytes Relative: 35
Monocytes Absolute: 1.1 — ABNORMAL HIGH
Neutro Abs: 9.3 — ABNORMAL HIGH
Neutrophils Relative %: 55

## 2011-05-07 LAB — CK TOTAL AND CKMB (NOT AT ARMC)
CK, MB: 0.8
CK, MB: 1.1
Relative Index: INVALID
Total CK: 41

## 2011-05-08 ENCOUNTER — Ambulatory Visit: Payer: Medicaid Other

## 2011-05-14 ENCOUNTER — Ambulatory Visit: Payer: Medicaid Other | Admitting: Family Medicine

## 2011-05-22 ENCOUNTER — Ambulatory Visit (INDEPENDENT_AMBULATORY_CARE_PROVIDER_SITE_OTHER): Payer: Medicaid Other | Admitting: Family Medicine

## 2011-05-22 ENCOUNTER — Encounter: Payer: Self-pay | Admitting: Family Medicine

## 2011-05-22 VITALS — BP 121/75 | HR 92 | Temp 98.0°F

## 2011-05-22 DIAGNOSIS — Z23 Encounter for immunization: Secondary | ICD-10-CM

## 2011-05-22 DIAGNOSIS — G43019 Migraine without aura, intractable, without status migrainosus: Secondary | ICD-10-CM

## 2011-05-22 DIAGNOSIS — B353 Tinea pedis: Secondary | ICD-10-CM

## 2011-05-22 DIAGNOSIS — E119 Type 2 diabetes mellitus without complications: Secondary | ICD-10-CM

## 2011-05-22 DIAGNOSIS — J329 Chronic sinusitis, unspecified: Secondary | ICD-10-CM

## 2011-05-22 MED ORDER — CEFDINIR 300 MG PO CAPS
300.0000 mg | ORAL_CAPSULE | Freq: Two times a day (BID) | ORAL | Status: AC
Start: 2011-05-22 — End: 2011-06-01

## 2011-05-22 MED ORDER — HYDROCODONE-ACETAMINOPHEN 5-500 MG PO TABS: ORAL_TABLET | ORAL | Status: DC

## 2011-05-22 MED ORDER — CLOTRIMAZOLE 1 % EX CREA: TOPICAL_CREAM | Freq: Two times a day (BID) | CUTANEOUS | Status: DC

## 2011-05-22 MED ORDER — PROMETHAZINE HCL 25 MG/ML IJ SOLN
25.0000 mg | Freq: Once | INTRAMUSCULAR | Status: AC
  Administered 2011-05-22: 25 mg via INTRAMUSCULAR

## 2011-05-22 MED ORDER — KETOROLAC TROMETHAMINE 30 MG/ML IJ SOLN
30.0000 mg | Freq: Once | INTRAMUSCULAR | Status: AC
  Administered 2011-05-22: 30 mg via INTRAMUSCULAR

## 2011-05-22 NOTE — Progress Notes (Signed)
  Subjective:    Patient ID: [name], female    DOB: February 24, 1956, 55 y.o.   MRN: [id number]  HPI 1. Migraine:  Here for typical migraine pain.  Pain is located on the left side and is typical of her other migraines.  Pain has been present for a few days.  Does have photophobia and phonophobia associated with this.  Has been using vicodin to control pain associated with this and is currently out.  Has been seen by Dr. Anne Hahn in neurology and currently on topamax for migraine prophylaxis.  Thinks that sinus issues have set this migraine off. Is  supposed to f/u with Dr. Anne Hahn on 10/17  2. Sinus pressure: Has had sinus pressure and congestion x2 weeks.  Has had sinus infections in the past and says this feels like that.  Has been on multiple antibiotics in the past with multiple allergies. Using flonase without any help.   Current symptoms include nasal congestion with sinus pressure.  She does have some mild upper tooth pain and increased pain with leaning forward.  She does have a yellow-green nasal discharge with a dry cough.  She denies fever, chills, bloody nasal discharge.    Review of Systems     Objective:   Physical Exam  Constitutional: She is oriented to person, place, and time. She appears well-developed and well-nourished. She appears distressed.  HENT:  Head: Normocephalic and atraumatic.  Nose: Mucosal edema present. Right sinus exhibits maxillary sinus tenderness and frontal sinus tenderness. Left sinus exhibits frontal sinus tenderness. Left sinus exhibits no maxillary sinus tenderness.  Mouth/Throat: Oropharynx is clear and moist and mucous membranes are normal.  Eyes: Right conjunctiva is not injected. Left conjunctiva is not injected.  Pulmonary/Chest: Effort normal and breath sounds normal.  Neurological: She is alert and oriented to person, place, and time. No cranial nerve deficit.          Assessment & Plan:

## 2011-05-22 NOTE — Patient Instructions (Addendum)
I have sent over a prescription for cefdinir for your sinus infection.  If you are not improving in a week please give [place] a call back.  Use the lotrimin cream twice a day on both feet.  Be sure to keep feet as dry as possible.  I hope you feel better. Please have Dr. Anne Hahn send [place] his note after you see him.

## 2011-05-23 ENCOUNTER — Ambulatory Visit: Payer: Medicaid Other

## 2011-06-01 NOTE — Assessment & Plan Note (Signed)
Typical migraine for her, no neurological deficits, to f/u with neuro on 10/17 Given toradol/phenergan for treatment Vicodin refilled.

## 2011-06-01 NOTE — Assessment & Plan Note (Signed)
Seems to be acute sinusitis.  Multiple listed allergies.  Will treat with cefdinir.  Given red flags to look for.

## 2011-07-11 ENCOUNTER — Encounter: Payer: Self-pay | Admitting: Family Medicine

## 2011-07-11 ENCOUNTER — Ambulatory Visit
Admission: RE | Admit: 2011-07-11 | Discharge: 2011-07-11 | Disposition: A | Discharge status: Home / Self Care | Payer: Medicaid Other | Source: Ambulatory Visit | Attending: Family Medicine | Admitting: Family Medicine

## 2011-07-11 ENCOUNTER — Ambulatory Visit (INDEPENDENT_AMBULATORY_CARE_PROVIDER_SITE_OTHER): Payer: Medicaid Other | Admitting: Family Medicine

## 2011-07-11 VITALS — BP 130/72 | HR 80 | Temp 97.6°F | Ht 61.0 in | Wt 188.9 lb

## 2011-07-11 DIAGNOSIS — J449 Chronic obstructive pulmonary disease, unspecified: Secondary | ICD-10-CM

## 2011-07-11 DIAGNOSIS — G43019 Migraine without aura, intractable, without status migrainosus: Secondary | ICD-10-CM

## 2011-07-11 DIAGNOSIS — R51 Headache: Secondary | ICD-10-CM

## 2011-07-11 MED ORDER — TIOTROPIUM BROMIDE MONOHYDRATE 18 MCG IN CAPS: 18.0000 ug | ORAL_CAPSULE | Freq: Every day | RESPIRATORY_TRACT | Status: DC

## 2011-07-11 MED ORDER — PREDNISONE 50 MG PO TABS: 50.0000 mg | ORAL_TABLET | Freq: Every day | ORAL | Status: DC

## 2011-07-11 MED ORDER — LEVOFLOXACIN 500 MG PO TABS: 500.0000 mg | ORAL_TABLET | Freq: Every day | ORAL | Status: DC

## 2011-07-11 MED ORDER — HYDROCODONE-ACETAMINOPHEN 5-500 MG PO TABS: ORAL_TABLET | ORAL | Status: DC

## 2011-07-14 ENCOUNTER — Telehealth: Payer: Self-pay | Admitting: Family Medicine

## 2011-07-14 MED ORDER — BENZONATATE 100 MG PO CAPS: 100.0000 mg | ORAL_CAPSULE | Freq: Three times a day (TID) | ORAL | Status: AC | PRN

## 2011-07-14 NOTE — Assessment & Plan Note (Signed)
Acute exacerbation.  Will continue albuterol q4 hours scheduled at home over next 48 hours.  Multiple allergies listed and reviewed.  Will give levaquin and prednisone burst for symptoms. Will get CXR given mild diminised breath sounds to eval for pneumonia.  Red flags reviewed.  Advised to quit smoking.

## 2011-07-14 NOTE — Progress Notes (Signed)
  Subjective:    Patient ID: [name], female    DOB: 10-08-1955, 56 y.o.   MRN: [id number]  HPI 1. Cough:  Comes in today with cough x1 week.   Cough has been productive for green phlegm.  She does have shortness of breath and wheezing associated with cough.  Has been out of spiriva but is using albuterol, with some help. Denies fever/chills, chest pain. Has a history of recurrent COPD and/or bronchitis flares in the fall and winter.  COntinues to smoke despite symptoms.   2. Migraine:  Cough has caused her migraine symptoms to flare.  She is requesting injections  that she typically gets for her migraines.  She also takes prn vicodin at home for these.  She denies nausea or photophobia currently which she has with her typical migraines.    Review of Systems     Objective:   Physical Exam  Constitutional: She is oriented to person, place, and time. She appears well-developed and well-nourished. No distress.  HENT:  Head: Normocephalic and atraumatic.  Nose: Right sinus exhibits no maxillary sinus tenderness and no frontal sinus tenderness. Left sinus exhibits no maxillary sinus tenderness and no frontal sinus tenderness.  Mouth/Throat: Oropharynx is clear and moist.  Cardiovascular: Normal rate and normal heart sounds.   Pulmonary/Chest: Effort normal. No respiratory distress. She has wheezes.       Diffuse wheezing, with mild diminished breath sound in RLL  Lymphadenopathy:    She has no cervical adenopathy.  Neurological: She is alert and oriented to person, place, and time. No cranial nerve deficit.          Assessment & Plan:

## 2011-07-14 NOTE — Telephone Encounter (Signed)
Wants to know results of xrays °

## 2011-07-14 NOTE — Telephone Encounter (Signed)
Called patient with results of CXR.  On appropriate abx along with steroids.  Still having cough, will send in Rx for tessalon.

## 2011-07-14 NOTE — Telephone Encounter (Signed)
Forward to dr Ashley Royalty

## 2011-07-14 NOTE — Telephone Encounter (Signed)
Message copied by Everrett Coombe on Mon Jul 14, 2011  4:01 PM ------      Message from: [facility], TODD D      Created: Fri Jul 11, 2011  5:20 PM                   ----- Message -----         From: Rad Results In Interface         Sent: 07/11/2011   4:04 PM           To: Leighton Roach McDiarmid, MD

## 2011-07-14 NOTE — Assessment & Plan Note (Signed)
Does not seem to be typical migraine, likely cough induced.  Will not give injectable migraine therapy today as she typically gets.  States oral NSAID's upset her stomach.  Refilled vicodin for now.  No current signs of abuse, will need pain contract if continues to need this medication.

## 2011-07-17 ENCOUNTER — Encounter: Payer: Self-pay | Admitting: Internal Medicine

## 2011-07-18 ENCOUNTER — Ambulatory Visit (INDEPENDENT_AMBULATORY_CARE_PROVIDER_SITE_OTHER): Payer: Medicaid Other | Admitting: Family Medicine

## 2011-07-18 ENCOUNTER — Encounter: Payer: Self-pay | Admitting: Family Medicine

## 2011-07-18 VITALS — BP 140/80 | HR 80 | Temp 97.5°F | Ht 61.0 in | Wt 190.8 lb

## 2011-07-18 DIAGNOSIS — Z720 Tobacco use: Secondary | ICD-10-CM

## 2011-07-18 DIAGNOSIS — J441 Chronic obstructive pulmonary disease with (acute) exacerbation: Secondary | ICD-10-CM

## 2011-07-18 DIAGNOSIS — J189 Pneumonia, unspecified organism: Secondary | ICD-10-CM | POA: Insufficient documentation

## 2011-07-18 DIAGNOSIS — G43909 Migraine, unspecified, not intractable, without status migrainosus: Secondary | ICD-10-CM

## 2011-07-18 DIAGNOSIS — F17200 Nicotine dependence, unspecified, uncomplicated: Secondary | ICD-10-CM

## 2011-07-18 MED ORDER — PREDNISONE 50 MG PO TABS: ORAL_TABLET | ORAL | Status: AC

## 2011-07-18 MED ORDER — NICOTINE POLACRILEX 2 MG MT GUM: 2.0000 mg | CHEWING_GUM | OROMUCOSAL | Status: AC | PRN

## 2011-07-18 MED ORDER — LEVOFLOXACIN 750 MG PO TABS: 750.0000 mg | ORAL_TABLET | Freq: Every day | ORAL | Status: AC

## 2011-07-18 MED ORDER — KETOROLAC TROMETHAMINE 60 MG/2ML IJ SOLN
60.0000 mg | Freq: Once | INTRAMUSCULAR | Status: AC
  Administered 2011-07-18: 60 mg via INTRAMUSCULAR

## 2011-07-18 MED ORDER — NICOTINE 21 MG/24HR TD PT24: 1.0000 | MEDICATED_PATCH | TRANSDERMAL | Status: AC

## 2011-07-18 NOTE — Patient Instructions (Addendum)
It was good to see you today  QUIT SMOKING I have prescribed you nicotine patches and nicotine gum for this Be sure to use your incentive spirometer every hour while awake. I have extended your treatment course out for an additional 5 days Follow up with Dr. Ashley Royalty next week. Call with any questions,  God Bless, Doree Albee MD   Smoking Cessation This document explains the best ways for you to quit smoking and new treatments to help. It lists new medicines that can double or triple your chances of quitting and quitting for good. It also considers ways to avoid relapses and concerns you may have about quitting, including weight gain. NICOTINE: A POWERFUL ADDICTION If you have tried to quit smoking, you know how hard it can be. It is hard because nicotine is a very addictive drug. For some people, it can be as addictive as heroin or cocaine. Usually, people make 2 or 3 tries, or more, before finally being able to quit. Each time you try to quit, you can learn about what helps and what hurts. Quitting takes hard work and a lot of effort, but you can quit smoking. QUITTING SMOKING IS ONE OF THE MOST IMPORTANT THINGS YOU WILL EVER DO.  You will live longer, feel better, and live better.   The impact on your body of quitting smoking is felt almost immediately:   Within 20 minutes, blood pressure decreases. Pulse returns to its normal level.   After 8 hours, carbon monoxide levels in the blood return to normal. Oxygen level increases.   After 24 hours, chance of heart attack starts to decrease. Breath, hair, and body stop smelling like smoke.   After 48 hours, damaged nerve endings begin to recover. Sense of taste and smell improve.   After 72 hours, the body is virtually free of nicotine. Bronchial tubes relax and breathing becomes easier.   After 2 to 12 weeks, lungs can hold more air. Exercise becomes easier and circulation improves.   Quitting will reduce your risk of having a heart  attack, stroke, cancer, or lung disease:   After 1 year, the risk of coronary heart disease is cut in half.   After 5 years, the risk of stroke falls to the same as a nonsmoker.   After 10 years, the risk of lung cancer is cut in half and the risk of other cancers decreases significantly.   After 15 years, the risk of coronary heart disease drops, usually to the level of a nonsmoker.   If you are pregnant, quitting smoking will improve your chances of having a healthy baby.   The people you live with, especially your children, will be healthier.   You will have extra money to spend on things other than cigarettes.  FIVE KEYS TO QUITTING Studies have shown that these 5 steps will help you quit smoking and quit for good. You have the best chances of quitting if you use them together: 1. Get ready.  2. Get support and encouragement.  3. Learn new skills and behaviors.  4. Get medicine to reduce your nicotine addiction and use it correctly.  5. Be prepared for relapse or difficult situations. Be determined to continue trying to quit, even if you do not succeed at first.  1. GET READY  Set a quit date.   Change your environment.   Get rid of ALL cigarettes, ashtrays, matches, and lighters in your home, car, and place of work.   Do not let people  smoke in your home.   Review your past attempts to quit. Think about what worked and what did not.   Once you quit, do not smoke. NOT EVEN A PUFF!  2. GET SUPPORT AND ENCOURAGEMENT Studies have shown that you have a better chance of being successful if you have help. You can get support in many ways.  Tell your family, friends, and coworkers that you are going to quit and need their support. Ask them not to smoke around you.   Talk to your caregivers (doctor, dentist, nurse, pharmacist, psychologist, and/or smoking counselor).   Get individual, group, or telephone counseling and support. The more counseling you have, the better your  chances are of quitting. Programs are available at [employer] and health centers. Call your local health department for information about programs in your area.   Spiritual beliefs and practices may help some smokers quit.   Quit meters are Photographer that keep track of quit statistics, such as amount of "quit-time," cigarettes not smoked, and money saved.   Many smokers find one or more of the many self-help books available useful in helping them quit and stay off tobacco.  3. LEARN NEW SKILLS AND BEHAVIORS  Try to distract yourself from urges to smoke. Talk to someone, go for a walk, or occupy your time with a task.   When you first try to quit, change your routine. Take a different route to work. Drink tea instead of coffee. Eat breakfast in a different place.   Do something to reduce your stress. Take a hot bath, exercise, or read a book.   Plan something enjoyable to do every day. Reward yourself for not smoking.   Explore interactive web-based programs that specialize in helping you quit.  4. GET MEDICINE AND USE IT CORRECTLY Medicines can help you stop smoking and decrease the urge to smoke. Combining medicine with the above behavioral methods and support can quadruple your chances of successfully quitting smoking. The U.[place]. Food and Drug Administration (FDA) has approved 7 medicines to help you quit smoking. These medicines fall into 3 categories.  Nicotine replacement therapy (delivers nicotine to your body without the negative effects and risks of smoking):   Nicotine gum: Available over-the-counter.   Nicotine lozenges: Available over-the-counter.   Nicotine inhaler: Available by prescription.   Nicotine nasal spray: Available by prescription.   Nicotine skin patches (transdermal): Available by prescription and over-the-counter.   Antidepressant medicine (helps people abstain from smoking, but how this works is unknown):    Bupropion sustained-release (SR) tablets: Available by prescription.   Nicotinic receptor partial agonist (simulates the effect of nicotine in your brain):   Varenicline tartrate tablets: Available by prescription.   Ask your caregiver for advice about which medicines to use and how to use them. Carefully read the information on the package.   Everyone who is trying to quit may benefit from using a medicine. If you are pregnant or trying to become pregnant, nursing an infant, you are under age 46, or you smoke fewer than 10 cigarettes per day, talk to your caregiver before taking any nicotine replacement medicines.   You should stop using a nicotine replacement product and call your caregiver if you experience nausea, dizziness, weakness, vomiting, fast or irregular heartbeat, mouth problems with the lozenge or gum, or redness or swelling of the skin around the patch that does not go away.   Do not use any other product containing nicotine while using  a nicotine replacement product.   Talk to your caregiver before using these products if you have diabetes, heart disease, asthma, stomach ulcers, you had a recent heart attack, you have high blood pressure that is not controlled with medicine, a history of irregular heartbeat, or you have been prescribed medicine to help you quit smoking.  5. BE PREPARED FOR RELAPSE OR DIFFICULT SITUATIONS  Most relapses occur within the first 3 months after quitting. Do not be discouraged if you start smoking again. Remember, most people try several times before they finally quit.   You may have symptoms of withdrawal because your body is used to nicotine. You may crave cigarettes, be irritable, feel very hungry, cough often, get headaches, or have difficulty concentrating.   The withdrawal symptoms are only temporary. They are strongest when you first quit, but they will go away within 10 to 14 days.  Here are some difficult situations to watch  for:  Alcohol. Avoid drinking alcohol. Drinking lowers your chances of successfully quitting.   Caffeine. Try to reduce the amount of caffeine you consume. It also lowers your chances of successfully quitting.   Other smokers. Being around smoking can make you want to smoke. Avoid smokers.   Weight gain. Many smokers will gain weight when they quit, usually less than 10 pounds. Eat a healthy diet and stay active. Do not let weight gain distract you from your main goal, quitting smoking. Some medicines that help you quit smoking may also help delay weight gain. You can always lose the weight gained after you quit.   Bad mood or depression. There are a lot of ways to improve your mood other than smoking.  If you are having problems with any of these situations, talk to your caregiver. SPECIAL SITUATIONS AND CONDITIONS Studies suggest that everyone can quit smoking. Your situation or condition can give you a special reason to quit.  Pregnant women/new mothers: By quitting, you protect your baby's health and your own.   Hospitalized patients: By quitting, you reduce health problems and help healing.   Heart attack patients: By quitting, you reduce your risk of a second heart attack.   Lung, head, and neck cancer patients: By quitting, you reduce your chance of a second cancer.   Parents of children and adolescents: By quitting, you protect your children from illnesses caused by secondhand smoke.  QUESTIONS TO THINK ABOUT Think about the following questions before you try to stop smoking. You may want to talk about your answers with your caregiver.  Why do you want to quit?   If you tried to quit in the past, what helped and what did not?   What will be the most difficult situations for you after you quit? How will you plan to handle them?   Who can help you through the tough times? Your family? Friends? Caregiver?   What pleasures do you get from smoking? What ways can you still get  pleasure if you quit?  Here are some questions to ask your caregiver:  How can you help me to be successful at quitting?   What medicine do you think would be best for me and how should I take it?   What should I do if I need more help?   What is smoking withdrawal like? How can I get information on withdrawal?  Quitting takes hard work and a lot of effort, but you can quit smoking. FOR MORE INFORMATION  Smokefree.gov (http://www.davis-sullivan.com/) provides free, accurate, evidence-based information  and professional assistance to help support the immediate and long-term needs of people trying to quit smoking. Document Released: 07/22/2001 Document Revised: 04/09/2011 Document Reviewed: 05/14/2009 [facility] Patient Information 2012 [place], [place].

## 2011-07-18 NOTE — Assessment & Plan Note (Signed)
Discussed at length about cessation. Especially given active exacerbation/PNA. Pt is willing to try cessation products. Nicotine patch and gum rxd.

## 2011-07-18 NOTE — Assessment & Plan Note (Signed)
Will extend treatment course out by 5 days with increased of levaquin (500-->750 daily) for PNA dose treatment. Will also extended prednisone duration as well. Discussed q1 hour I/S use as well as smoking cessation. Planned follow up with PCP next week. Pt agreeable to plan.

## 2011-07-18 NOTE — Progress Notes (Signed)
S:  Pt is here for reevaluation of COPD exacerbation.Pt was placed last week on prednisone and levaquin (multiple abx allergies) for treatment. A CXR was also obtained to r/o PNA. CXR showed possible RLL early infiltrate. Today, pt states that she is milnimally improved since last clinical visit. No fevers. Still with productive cough. Minimal increased WOB. Pt states that she has been smoking up 1/2 PPD while sick. Pt has been compliant with medication regimen. Pt states that she has an incentive spirometer that she has been intermittently using.   O:  Current outpatient prescriptions:ACCU-CHEK FASTCLIX LANCETS MISC, 1 Device by Does not apply route daily., Disp: 102 each, Rfl: 3;  albuterol (PROVENTIL,VENTOLIN) 90 MCG/ACT inhaler, Inhale 2 puffs into the lungs every 4 (four) hours as needed., Disp: 17 g, Rfl: 6;  ALPRAZolam (XANAX) 0.5 MG tablet, Take 1 tablet (0.5 mg total) by mouth 2 (two) times daily as needed for sleep or anxiety., Disp: 40 tablet, Rfl: 1 benzonatate (TESSALON PERLES) 100 MG capsule, Take 1 capsule (100 mg total) by mouth 3 (three) times daily as needed for cough., Disp: 21 capsule, Rfl: 0;  Blood Glucose Monitoring Suppl (ACCU-CHEK AVIVA PLUS) W/DEVICE KIT, 1 Device by Does not apply route daily. Dx 250.00, may use equivalent emeter, Disp: 1 kit, Rfl: 0;  Cholecalciferol (VITAMIN D3) 2000 UNIT TABS, Take 1 tablet by mouth daily.  , Disp: , Rfl:  clotrimazole (LOTRIMIN AF) 1 % cream, Apply topically 2 (two) times daily., Disp: 30 g, Rfl: 0;  dicyclomine (BENTYL) 20 MG tablet, TAKE 1 TABLET BY MOUTH 1 HOUR BEFORE MEALS AND AT BEDTIME, Disp: 120 tablet, Rfl: 5;  fluticasone (FLONASE) 50 MCG/ACT nasal spray, 2 sprays by Nasal route daily., Disp: 16 g, Rfl: 3;  glucose blood (ACCU-CHEK AVIVA PLUS) test strip, Use as instructed, Disp: 100 each, Rfl: 12 HYDROcodone-acetaminophen (VICODIN) 5-500 MG per tablet, For migraines. Every 6 hours as needed, Disp: 40 tablet, Rfl: 0;  lactulose  (CHRONULAC) 10 GM/15ML solution, take 30-89mL as needed for soft bowel movements.  disp , Disp: , Rfl: ;  lamoTRIgine (LAMICTAL) 150 MG tablet, Take 150 mg by mouth 2 (two) times daily.  , Disp: , Rfl: ;  levofloxacin (LEVAQUIN) 750 MG tablet, Take 1 tablet (750 mg total) by mouth daily., Disp: 5 tablet, Rfl: 0 loratadine (CLARITIN) 10 MG tablet, Take 10 mg by mouth daily.  , Disp: , Rfl: ;  nicotine (NICODERM CQ - DOSED IN MG/24 HOURS) 21 mg/24hr patch, Place 1 patch (21 patches total) onto the skin daily., Disp: 28 patch, Rfl: 0;  nicotine polacrilex (NICORETTE) 2 MG gum, Take 1 each (2 mg total) by mouth as needed for smoking cessation., Disp: 100 tablet, Rfl: 0 omeprazole (PRILOSEC) 40 MG capsule, Take one 30-60 min before first and last meals of the day, Disp: 60 capsule, Rfl: 6;  predniSONE (DELTASONE) 50 MG tablet, 1 tablet daily x 5 days, Disp: 5 tablet, Rfl: 0;  simvastatin (ZOCOR) 40 MG tablet, Take 1 tablet (40 mg total) by mouth daily., Disp: 30 tablet, Rfl: 6;  tiotropium (SPIRIVA) 18 MCG inhalation capsule, Place 1 capsule (18 mcg total) into inhaler and inhale daily., Disp: 30 capsule, Rfl: 3 topiramate (TOPAMAX) 100 MG tablet, Take 100 mg by mouth 2 (two) times daily.  , Disp: , Rfl:   Wt Readings from Last 3 Encounters:  07/18/11 190 lb 12.8 oz (86.546 kg)  07/11/11 188 lb 14.4 oz (85.684 kg)  04/16/11 186 lb (84.369 kg)   Temp  Readings from Last 3 Encounters:  07/18/11 97.5 F (36.4 C) Oral  07/11/11 97.6 F (36.4 C) Oral  05/22/11 98 F (36.7 C) Oral   BP Readings from Last 3 Encounters:  07/18/11 140/80  07/11/11 130/72  05/22/11 121/75   Pulse Readings from Last 3 Encounters:  07/18/11 80  07/11/11 80  05/22/11 92    General: alert, cooperative and NAD HEENT: PERRLA, extra ocular movement intact, sclera clear, anicteric and oropharynx clear, no lesions Heart: S1, S2 normal, no murmur, rub or gallop, regular rate and rhythm Lungs: good overall air movement,  diffuse end expiratory wheezes most prominent in apices.  Abdomen: obese abdomen   A/P:

## 2011-07-18 NOTE — Progress Notes (Deleted)
  Subjective:    Patient ID: [name], female    DOB: [birth year], 55 y.o.   MRN: [id number]  HPI    Review of Systems     Objective:   Physical Exam        Assessment & Plan:

## 2011-07-18 NOTE — Progress Notes (Signed)
Addended by: Barnie Alderman on: 07/18/2011 02:54 PM   Modules accepted: Orders

## 2011-07-21 ENCOUNTER — Telehealth: Payer: Self-pay | Admitting: Family Medicine

## 2011-07-21 MED ORDER — OMEPRAZOLE 40 MG PO CPDR: DELAYED_RELEASE_CAPSULE | ORAL | Status: DC

## 2011-07-21 NOTE — Telephone Encounter (Signed)
[name] still having received refill for her omeprazole rx.  Please send to pharmacy.  Have been waiting since last Tuesday for refill.

## 2011-08-31 ENCOUNTER — Encounter (HOSPITAL_COMMUNITY): Payer: Self-pay | Admitting: Emergency Medicine

## 2011-08-31 ENCOUNTER — Emergency Department (HOSPITAL_COMMUNITY): Payer: Medicaid Other

## 2011-08-31 ENCOUNTER — Emergency Department (HOSPITAL_COMMUNITY)
Admission: EM | Admit: 2011-08-31 | Discharge: 2011-08-31 | Disposition: A | Discharge status: Home / Self Care | Payer: Medicaid Other | Attending: Emergency Medicine | Admitting: Emergency Medicine

## 2011-08-31 DIAGNOSIS — F3289 Other specified depressive episodes: Secondary | ICD-10-CM | POA: Insufficient documentation

## 2011-08-31 DIAGNOSIS — F329 Major depressive disorder, single episode, unspecified: Secondary | ICD-10-CM | POA: Insufficient documentation

## 2011-08-31 DIAGNOSIS — J441 Chronic obstructive pulmonary disease with (acute) exacerbation: Secondary | ICD-10-CM | POA: Insufficient documentation

## 2011-08-31 DIAGNOSIS — R0602 Shortness of breath: Secondary | ICD-10-CM | POA: Insufficient documentation

## 2011-08-31 DIAGNOSIS — R05 Cough: Secondary | ICD-10-CM | POA: Insufficient documentation

## 2011-08-31 DIAGNOSIS — Z79899 Other long term (current) drug therapy: Secondary | ICD-10-CM | POA: Insufficient documentation

## 2011-08-31 DIAGNOSIS — G4733 Obstructive sleep apnea (adult) (pediatric): Secondary | ICD-10-CM | POA: Insufficient documentation

## 2011-08-31 DIAGNOSIS — E785 Hyperlipidemia, unspecified: Secondary | ICD-10-CM | POA: Insufficient documentation

## 2011-08-31 DIAGNOSIS — J40 Bronchitis, not specified as acute or chronic: Secondary | ICD-10-CM

## 2011-08-31 DIAGNOSIS — R062 Wheezing: Secondary | ICD-10-CM | POA: Insufficient documentation

## 2011-08-31 DIAGNOSIS — E119 Type 2 diabetes mellitus without complications: Secondary | ICD-10-CM | POA: Insufficient documentation

## 2011-08-31 DIAGNOSIS — K219 Gastro-esophageal reflux disease without esophagitis: Secondary | ICD-10-CM | POA: Insufficient documentation

## 2011-08-31 DIAGNOSIS — G40909 Epilepsy, unspecified, not intractable, without status epilepticus: Secondary | ICD-10-CM | POA: Insufficient documentation

## 2011-08-31 DIAGNOSIS — R059 Cough, unspecified: Secondary | ICD-10-CM | POA: Insufficient documentation

## 2011-08-31 DIAGNOSIS — H409 Unspecified glaucoma: Secondary | ICD-10-CM | POA: Insufficient documentation

## 2011-08-31 DIAGNOSIS — I10 Essential (primary) hypertension: Secondary | ICD-10-CM | POA: Insufficient documentation

## 2011-08-31 LAB — GLUCOSE, CAPILLARY: Glucose-Capillary: 119 mg/dL — ABNORMAL HIGH (ref 70–99)

## 2011-08-31 MED ORDER — KETOROLAC TROMETHAMINE 60 MG/2ML IM SOLN
60.0000 mg | Freq: Once | INTRAMUSCULAR | Status: AC
  Administered 2011-08-31: 60 mg via INTRAMUSCULAR
  Filled 2011-08-31: qty 2

## 2011-08-31 MED ORDER — HYDROCODONE-HOMATROPINE 5-1.5 MG/5ML PO SYRP
5.0000 mL | ORAL_SOLUTION | ORAL | Status: AC
  Administered 2011-08-31: 5 mL via ORAL
  Filled 2011-08-31: qty 5

## 2011-08-31 MED ORDER — HYDROCODONE-HOMATROPINE 5-1.5 MG/5ML PO SYRP: 5.0000 mL | ORAL_SOLUTION | Freq: Four times a day (QID) | ORAL | Status: AC | PRN

## 2011-08-31 MED ORDER — PREDNISONE 20 MG PO TABS
60.0000 mg | ORAL_TABLET | Freq: Once | ORAL | Status: AC
  Administered 2011-08-31: 60 mg via ORAL
  Filled 2011-08-31: qty 1

## 2011-08-31 MED ORDER — MOXIFLOXACIN HCL 400 MG PO TABS: 400.0000 mg | ORAL_TABLET | Freq: Every day | ORAL | Status: AC

## 2011-08-31 MED ORDER — ALBUTEROL SULFATE HFA 108 (90 BASE) MCG/ACT IN AERS
8.0000 | INHALATION_SPRAY | RESPIRATORY_TRACT | Status: AC
  Administered 2011-08-31 (×2): 8 via RESPIRATORY_TRACT
  Filled 2011-08-31: qty 6.7

## 2011-08-31 NOTE — ED Notes (Signed)
Patient complaining of shortness of breath; has been diagnosed with pneumonia in December and has a history of COPD.  Patient became short of breath today; became progressively worse throughout the day. Patient took inhaler 30 minutes prior to EMS arrival; patient states that the inhaler helped, but wanted to get checked out at hospital. Breath sounds clear; O2 99%.

## 2011-08-31 NOTE — ED Notes (Signed)
Family at bedside. 

## 2011-08-31 NOTE — ED Notes (Signed)
Received report from [place], Charity fundraiser.  Patient currently sitting up in bed; no respiratory or acute distress noted.  Patient updated on plan of care; informed patient that we are currently waiting on the EDP to come and talk to patient about x-ray results.  Patient requesting medication for her migraine; EDP notified.  Patient has no other questions or concerns at this time.  Will continue to monitor.

## 2011-08-31 NOTE — ED Notes (Addendum)
Pt states she has migraines and has to get "shots" for her headache.  Doesn't not know what meds she receives but takes vicodin at home for the migraine pain

## 2011-08-31 NOTE — ED Provider Notes (Signed)
History     CSN: [id number]  Arrival date & time 08/31/11  4098   First MD Initiated Contact with Patient 08/31/11 2002      Chief Complaint  Patient presents with  . Shortness of Breath    (Consider location/radiation/quality/duration/timing/severity/associated sxs/prior treatment) Patient is a 56 y.o. female presenting with shortness of breath. The history is provided by the patient.  Shortness of Breath  Episode onset: several days ago. The problem occurs continuously. The problem has been gradually worsening. The problem is moderate. Relieved by: albuterol. The symptoms are aggravated by nothing. Associated symptoms include cough and shortness of breath. Pertinent negatives include no chest pain, no fever, no rhinorrhea, no sore throat, no stridor and no wheezing. She has had intermittent steroid use (prednisone). Past medical history comments: COPD. She has been behaving normally. There were no sick contacts. Recent Medical Care: treated for pneumonia about 3-4 weeks ago.    Past Medical History  Diagnosis Date  . Allergy   . COPD (chronic obstructive pulmonary disease)   . Depression   . Diabetes mellitus     No meds since weight loss  . Hypertension   . Hyperlipidemia   . GERD (gastroesophageal reflux disease)   . Seizures   . OSA on CPAP   . Glaucoma   . DJD (degenerative joint disease)   . Neck pain     Cervical disc  . Migraine     Past Surgical History  Procedure Date  . Abdominal hysterectomy     History reviewed. No pertinent family history.  History  Substance Use Topics  . Smoking status: Current Everyday Smoker -- 1.0 packs/day    Types: Cigarettes  . Smokeless tobacco: Not on file   Comment: does not want to quitt  . Alcohol Use: Not on file    OB History    Grav Para Term Preterm Abortions TAB SAB Ect Mult Living                  Review of Systems  Constitutional: Negative for fever, chills, activity change, appetite change and fatigue.    HENT: Negative for congestion, sore throat, rhinorrhea, neck pain and neck stiffness.   Eyes: Negative for photophobia, redness and visual disturbance.  Respiratory: Positive for cough and shortness of breath. Negative for wheezing and stridor.   Cardiovascular: Negative for chest pain, palpitations and leg swelling.  Gastrointestinal: Negative for nausea, vomiting, abdominal pain, diarrhea, constipation and blood in stool.  Genitourinary: Negative for dysuria, urgency, hematuria and flank pain.  Musculoskeletal: Negative for back pain.  Skin: Negative for rash and wound.  Neurological: Negative for dizziness, seizures, facial asymmetry, speech difficulty, weakness, light-headedness, numbness and headaches.  Psychiatric/Behavioral: Negative for confusion.  All other systems reviewed and are negative.    Allergies  Advair hfa; Azithromycin; Clarithromycin; Doxycycline hyclate; Penicillins; and Sulfonamide derivatives  Home Medications   Current Outpatient Rx  Name Route Sig Dispense Refill  . ALBUTEROL SULFATE HFA 108 (90 BASE) MCG/ACT IN AERS Inhalation Inhale 2 puffs into the lungs every 6 (six) hours as needed. For shortness of breath    . VITAMIN D3 2000 UNITS PO TABS Oral Take 1 tablet by mouth daily.      Marland Kitchen DICYCLOMINE HCL 20 MG PO TABS Oral Take 20 mg by mouth 4 (four) times daily -  before meals and at bedtime.    Marland Kitchen FLUTICASONE PROPIONATE 50 MCG/ACT NA SUSP Nasal Place 2 sprays into the nose daily.    Marland Kitchen  HYDROCODONE-ACETAMINOPHEN 5-500 MG PO TABS Oral Take 1 tablet by mouth every 6 (six) hours as needed. For pain    . LACTULOSE 10 GM/15ML PO SOLN  take 30-4mL as needed for soft bowel movements.  disp     . LAMOTRIGINE 150 MG PO TABS Oral Take 150 mg by mouth 2 (two) times daily.     Marland Kitchen LORATADINE 10 MG PO TABS Oral Take 10 mg by mouth daily.      Marland Kitchen OMEPRAZOLE 40 MG PO CPDR Oral Take 40 mg by mouth daily.    Marland Kitchen SIMVASTATIN 40 MG PO TABS Oral Take 40 mg by mouth every  evening.    Marland Kitchen TIOTROPIUM BROMIDE MONOHYDRATE 18 MCG IN CAPS Inhalation Place 18 mcg into inhaler and inhale daily.    . TOPIRAMATE 100 MG PO TABS Oral Take 100 mg by mouth 2 (two) times daily.        BP 122/69  Pulse 89  Temp(Src) 98 F (36.7 C) (Oral)  Resp 23  SpO2 94%  Physical Exam  Nursing note and vitals reviewed. Constitutional: She is oriented to person, place, and time. She appears well-developed and well-nourished.  Non-toxic appearance. No distress.  HENT:  Head: Normocephalic and atraumatic.  Mouth/Throat: Oropharynx is clear and moist.  Eyes: Conjunctivae and EOM are normal. Pupils are equal, round, and reactive to light. No scleral icterus.  Neck: Normal range of motion. Neck supple. No JVD present.  Cardiovascular: Normal rate, regular rhythm, normal heart sounds and intact distal pulses.   No murmur heard. Pulmonary/Chest: Effort normal. No respiratory distress. She has wheezes (mild end-expiratory wheeze). She has no rales.  Abdominal: Soft. Bowel sounds are normal. She exhibits no distension. There is no tenderness. There is no rebound and no guarding.  Musculoskeletal: Normal range of motion. She exhibits no edema.       No calf tenderness. Negative Homan's.  Neurological: She is alert and oriented to person, place, and time. She has normal strength. No cranial nerve deficit. GCS eye subscore is 4. GCS verbal subscore is 5. GCS motor subscore is 6.  Skin: Skin is warm and dry. No rash noted. She is not diaphoretic.  Psychiatric: She has a normal mood and affect.    ED Course  Procedures (including critical care time)  Labs Reviewed  GLUCOSE, CAPILLARY - Abnormal; Notable for the following:    Glucose-Capillary 119 (*)    All other components within normal limits  POCT CBG MONITORING   Dg Chest 2 View  08/31/2011  *RADIOLOGY REPORT*  Clinical Data: Mid chest pain with shortness of breath, cough and congestion.  CHEST - 2 VIEW  Comparison: 07/11/2011 and  03/02/2009 radiographs.  Findings: The heart size and mediastinal contours are stable. There is mild diffuse central airway thickening which appears chronic.  No airspace disease, hyperinflation or pleural effusion is identified.  The osseous structures appear unchanged.  IMPRESSION: Mild chronic central airway thickening.  No evidence of pneumonia or other acute process.  Original Report Authenticated By: Gerrianne Scale, M.D.     1. COPD exacerbation   2. Bronchitis       MDM  55yo CF with PMH significant for COPD, HTN, GERD, and diet controlled DM who presents to the ED due to dyspnea and wheezing. Has been ongoing since PNA last month. Worse over the last few days. No hx fever. Mild increase in sputum and cough worse than her baseline. Mild expiratory wheezing. She used albuterol just prior to coming  to the ED and is feeling like her breathing has improved. Doubt PE as HR normal. No leg pain and no chest pain. Not c/w ACS. Will check EKG. Also checking BG as has hx borderline DM. Giving albuterol and prednisone. Will get CXR to r/o infiltrate. Pt looks well. Will reassess after albuterol.  At 9:41 PM pt c/o HA says this usually happens after coughing. Giving hycodan for cough and toradol.   Pt reassessed and feeling better. She is dressed and wants to go home. Will d/c.   Verne Carrow, MD 08/31/11 2227

## 2011-08-31 NOTE — ED Notes (Signed)
Pt complains of SOB and headache.  States chest hurts into back from cough. History of COPD and recently diagnosed with pneumonia.  No distress at this time.  Lung sounds equal bilaterally with slight wheezes on expiration.  Cough sounds tight and pt states she has had the cough since Dec.

## 2011-08-31 NOTE — ED Notes (Signed)
Patient given discharge paperwork; went over discharge instructions with patient.  Instructed patient to take prescriptions as directed, to finish entire antibiotic prescription, to follow up with primary care physician/referral, and to return to the ED for new, worsening, or concerning symptoms.

## 2011-08-31 NOTE — ED Notes (Signed)
Patient dressed and ready to go; informed patient that we have to wait on discharge paperwork from EDP.  Patient verbalized understanding; has no other questions or concerns at this time.  Will continue to monitor.

## 2011-09-05 ENCOUNTER — Ambulatory Visit: Payer: Medicaid Other | Admitting: Family Medicine

## 2011-09-10 NOTE — ED Provider Notes (Signed)
Evaluation and management procedures were performed by the resident physician under my supervision/collaboration.    Felisa Bonier, MD 09/10/11 [phone]

## 2011-09-11 ENCOUNTER — Ambulatory Visit (INDEPENDENT_AMBULATORY_CARE_PROVIDER_SITE_OTHER): Payer: Medicaid Other | Admitting: Family Medicine

## 2011-09-11 VITALS — BP 113/80 | HR 74 | Temp 98.1°F | Ht 61.0 in | Wt 188.0 lb

## 2011-09-11 DIAGNOSIS — G43019 Migraine without aura, intractable, without status migrainosus: Secondary | ICD-10-CM

## 2011-09-11 DIAGNOSIS — J449 Chronic obstructive pulmonary disease, unspecified: Secondary | ICD-10-CM

## 2011-09-11 DIAGNOSIS — K219 Gastro-esophageal reflux disease without esophagitis: Secondary | ICD-10-CM

## 2011-09-11 MED ORDER — KETOROLAC TROMETHAMINE 30 MG/ML IJ SOLN
30.0000 mg | Freq: Once | INTRAMUSCULAR | Status: AC
  Administered 2011-09-11: 30 mg via INTRAMUSCULAR

## 2011-09-22 NOTE — Assessment & Plan Note (Signed)
Symptoms sound like stricture vs. Esophageal spasm.  Continue PPI.  WIll refer back to GI as she had dilatation done in 2004, will refer back for re-evaluation

## 2011-09-22 NOTE — Assessment & Plan Note (Addendum)
Recurrent migraines, unable to go back to neuro or headache clinic due to cost issues.  She is using some type of analgesic almost around the clock, so i suspect a lot of her recurrence is due to analgesic rebound.  Explained to her that she should try to avoid medication unless headaches are unbearable and not use everyday as this is likely making her headache cycles worse.  Will give toradol x1 today to break current cylce.

## 2011-09-22 NOTE — Progress Notes (Signed)
  Subjective:    Patient ID: [name], female    DOB: [birth year], 56 y.o.   MRN: [id number]  HPI 1. F/u ED Visit:  Was seen in ED last week ago dx with bronchitis.  Was instructed to f/u.  Given hycodan rx but did not get filled.  Still with cough, using OTC robistussin.  She denies shortness of breath and albuterol helps.  She is still smoking.  She is requesting to have handicap placard application completed because she gets does feel shortness of breath sometimes walking to her car from a building.   2.  Stomach Pain:  Has pain in stomach and esophagus.  States she feels like her food gets stuck in her esophagus.  She is taking prilosec which helps a little bit.  She denies any nausea with this.  SHe has been seen by GI in the past and had to have esophageal dilatation.  She denies chest pain, palpitations, or radiation of pain.   3.  Headache:  History or recurrent migraines.  Headache today started this am.  Unsure what triggered this episode.  Has been using ibuprofen with no relief.  Was given prescription for vicodin previously and has been using around the clock along with other analgesics.   Has been seen by neuro before but States she can not afford to go back to neurologist   Review of Systems     Objective:   Physical Exam  Constitutional: She is oriented to person, place, and time. No distress.  HENT:  Mouth/Throat: Oropharynx is clear and moist.  Neck: Neck supple.  Cardiovascular: Normal rate, regular rhythm and normal heart sounds.   Pulmonary/Chest: Breath sounds normal. No respiratory distress. She has no wheezes.       Walking pulse ox:  Able to walk >100 yards and maintained oxygenation >96%  Abdominal: Soft. Bowel sounds are normal. She exhibits no distension. There is no tenderness.  Musculoskeletal: She exhibits no edema.  Neurological: She is alert and oriented to person, place, and time.          Assessment & Plan:

## 2011-09-22 NOTE — Assessment & Plan Note (Signed)
Exacerbation improving, no indication for additional abx or steroids at this time.  Advised to quit smoking.  Explained to her that she does not meet criteria for handicap placard.

## 2011-09-23 ENCOUNTER — Encounter: Payer: Self-pay | Admitting: Internal Medicine

## 2011-09-30 ENCOUNTER — Encounter: Payer: Self-pay | Admitting: *Deleted

## 2011-10-02 ENCOUNTER — Encounter: Payer: Self-pay | Admitting: Internal Medicine

## 2011-10-07 ENCOUNTER — Encounter: Payer: Self-pay | Admitting: Internal Medicine

## 2011-10-07 ENCOUNTER — Ambulatory Visit (INDEPENDENT_AMBULATORY_CARE_PROVIDER_SITE_OTHER): Payer: Medicaid Other | Admitting: Internal Medicine

## 2011-10-07 VITALS — BP 126/72 | HR 76 | Ht 61.0 in | Wt 187.1 lb

## 2011-10-07 DIAGNOSIS — R1319 Other dysphagia: Secondary | ICD-10-CM

## 2011-10-07 DIAGNOSIS — Z860100 Personal history of colon polyps, unspecified: Secondary | ICD-10-CM

## 2011-10-07 DIAGNOSIS — R1011 Right upper quadrant pain: Secondary | ICD-10-CM

## 2011-10-07 DIAGNOSIS — K219 Gastro-esophageal reflux disease without esophagitis: Secondary | ICD-10-CM

## 2011-10-07 MED ORDER — ESOMEPRAZOLE MAGNESIUM 40 MG PO CPDR: 40.0000 mg | DELAYED_RELEASE_CAPSULE | Freq: Two times a day (BID) | ORAL | Status: DC

## 2011-10-07 MED ORDER — HYDROCORTISONE ACETATE 25 MG RE SUPP: 25.0000 mg | Freq: Every day | RECTAL | Status: DC

## 2011-10-07 MED ORDER — PRAMOXINE-HC 1-1 % EX CREA: TOPICAL_CREAM | CUTANEOUS | Status: DC

## 2011-10-07 MED ORDER — PEG-KCL-NACL-NASULF-NA ASC-C 100 G PO SOLR: 1.0000 | Freq: Once | ORAL | Status: DC

## 2011-10-07 NOTE — Patient Instructions (Addendum)
You have been given a separate informational sheet regarding your tobacco use, the importance of quitting and local resources to help you quit. You have been scheduled for an endoscopy and colonoscopy with propofol. Please follow the written instructions given to you at your visit today. Please pick up your prep at the pharmacy within the next 1-3 days. We have sent the following medications to your pharmacy for you to pick up at your convenience: Anusol Suppositories (1 every night) We have given you samples of Nexium to take 1 capsule by mouth twice daily until your procedure. We have given you samples of Analpram cream to apply to your rectum 3 times daily as needed. You have been scheduled for an abdominal ultrasound at [facility] (1st floor of hospital) on Friday 10/10/11 at 2:00 PM.Please arrive 15 minutes prior to your appointment for registration. Make certain not to have anything to eat or drink 6 hours prior to your appointment. Should you need to reschedule your appointment, please contact radiology at [phone]. CC: Dr Everrett Coombe

## 2011-10-07 NOTE — Progress Notes (Signed)
[name] 11/23/1955 MRN [id number]   History of Present Illness:  This is a 56 year old white female with solid food dysphagia. She has a history of having similar problems in 2004 when she underwent an upper endoscopy showing a small hiatal hernia. She was dilated with a Maloney dilator with good results, although no definite stricture was seen. A barium esophagram showed esophageal dysmotility. She says the dilatation helped her until recently when the dysphagia recurred and she had one episode of food impaction which resolved spontaneously. She has been on Prilosec 40 mg twice a day with persistent heartburn and difficulties. She continues to smoke. She had a colonoscopy in October 2007 for evaluation of diarrhea which was attributed to Topamax. She was found to have a tubular adenoma and was due for a recall colonoscopy at a five-year interval which would have been October 2012. Her upper abdominal ultrasound in July 2007 was negative. She has a positive family history of gallbladder disease in her mother. She is complaining of right upper quadrant pain and constipation for which she takes lactulose. She complains of hemorrhoids and rectal irritation.   Past Medical History  Diagnosis Date  . Allergic rhinitis   . COPD (chronic obstructive pulmonary disease)   . Depression   . Diabetes mellitus     No meds since weight loss  . Hypertension   . Hyperlipidemia   . GERD (gastroesophageal reflux disease)   . Seizures   . OSA on CPAP   . Glaucoma   . DJD (degenerative joint disease)   . Neck pain     Cervical disc  . Migraine   . IBS (irritable bowel syndrome)   . Esophageal stricture   . Diverticulosis   . Arthritis   . H/O blood clots    Past Surgical History  Procedure Date  . Abdominal hysterectomy   . Tubal ligation   . Knee arthroscopy     left  . Carpal tunnel release     right  . Shoulder surgery     left    reports that she has been smoking Cigarettes.  She has  been smoking about 1 pack per day. She has never used smokeless tobacco. She reports that she does not drink alcohol or use illicit drugs. family history includes Alcohol abuse in her father; Bipolar disorder in her son; Breast cancer in her maternal aunt; Diabetes in her maternal grandfather and unspecified family members; Heart disease in her maternal grandfather, sister, son, and unspecified family member; Lung cancer in her father and mother; Muscular dystrophy in her son; Other in her sister; and Stroke in her mother.  There is no history of Colon cancer. Allergies  Allergen Reactions  . Advair Hfa     REACTION: ulcers in mouth  . Azithromycin Swelling  . Clarithromycin   . Doxycycline Hyclate     REACTION: respiratory distress  . Penicillins   . Sulfonamide Derivatives         Review of Systems: Positive for dysphagia. Negative for odynophagia shortness of breath or chest pain  The remainder of the 10 point ROS is negative except as outlined in H&P   Physical Exam: General appearance  Well developed, in no distress. Eyes- non icteric. HEENT nontraumatic, normocephalic. Mouth no lesions, tongue papillated, no cheilosis. Neck supple without adenopathy, thyroid not enlarged, no carotid bruits, no JVD. Lungs Clear to auscultation bilaterally. Cor normal S1, normal S2, regular rhythm, no murmur,  quiet precordium. Abdomen: Tender right upper quadrant.  Liver edge 2 cm below right costal margin. No ascites. No distention. Rectal: External hemorrhoidal tag. Normal rectal sphincter tone. Stool is Hemoccult negative Extremities no pedal edema. Skin no lesions. Neurological alert and oriented x 3. Psychological normal mood and affect.  Assessment and Plan:  Problem #1 dysphagia. This is occurring with solids and sometimes with liquids. She responded to an esophageal dilation in the past. She also has known esophageal dysmotility based on her barium esophagram in 2004. We will  schedule her for an upper endoscopy with possible dilatation and switch her to Nexium 40 mg by mouth twice a day. We will plan to rule out Barrett's esophagus. I asked her to participate in a smoking cessation program. Problem #2 colorectal screening. Patient had a tubular adenoma in 05/2006 and is due for a recall colonoscopy. This will be done the same day as her endoscopy. For constipation, she will continue lactulose 3 times a week on a regular basis. For her hemorrhoids, we will prescribe Anusol-HC suppositories.  10/07/2011 Lina Sar

## 2011-10-09 ENCOUNTER — Ambulatory Visit (INDEPENDENT_AMBULATORY_CARE_PROVIDER_SITE_OTHER): Payer: Medicaid Other | Admitting: Family Medicine

## 2011-10-09 ENCOUNTER — Encounter: Payer: Self-pay | Admitting: Family Medicine

## 2011-10-09 VITALS — BP 112/79 | HR 76 | Temp 97.8°F | Ht 61.0 in | Wt 187.0 lb

## 2011-10-09 DIAGNOSIS — R071 Chest pain on breathing: Secondary | ICD-10-CM

## 2011-10-09 DIAGNOSIS — R0789 Other chest pain: Secondary | ICD-10-CM | POA: Insufficient documentation

## 2011-10-09 NOTE — Patient Instructions (Signed)
Your soreness is likely related to muscle strain in your chest wall.  Continue to use ice and ibuprofen for pain  Follow-up if not improved in 1-2 weeks  Your are overdue for your mammogram- see information on scheduling

## 2011-10-09 NOTE — Progress Notes (Signed)
  Subjective:    Patient ID: [name], female    DOB: 1955/10/10, 56 y.o.   MRN: [id number]  HPI Work in appt for right breast soreness x 3-4 days.    Notes soreness in right upper chest wall,  No lymphadenopathy, skin changes, nipple discharge.  No injury or change in physical activity.  Recent bronchitis in past several weeks, now resolved.  PMH sig for aunt with breast ca in 53's. Review of Systemssee HPI     Objective:   Physical Exam  Pulmonary/Chest:     GEN; NAD< well appearing. Breasts: normal and symmetrical bilaterally.  No skin changes, nipple discharge, or masses.  No axillary lymphadenopathy. Right upper chest wall- patient points to area as the area of pain and is reproduced on palpation.  Distinct from axillary area/tail or breast tissue       Assessment & Plan:

## 2011-10-09 NOTE — Assessment & Plan Note (Signed)
Mild chest wall pain.  MSK in nature.  Normal breast exam.   Advised ice, ibuprofen and to return for evaluation if no improvement in 1-2 weeks.  On exam, not in area of breast tissue.  Asked patient to schedule screening mammogram as overdue.

## 2011-10-10 ENCOUNTER — Ambulatory Visit (HOSPITAL_COMMUNITY)
Admission: RE | Admit: 2011-10-10 | Discharge: 2011-10-10 | Disposition: A | Discharge status: Home / Self Care | Payer: Medicaid Other | Source: Ambulatory Visit | Attending: Internal Medicine | Admitting: Internal Medicine

## 2011-10-10 DIAGNOSIS — K828 Other specified diseases of gallbladder: Secondary | ICD-10-CM | POA: Insufficient documentation

## 2011-10-10 DIAGNOSIS — R1011 Right upper quadrant pain: Secondary | ICD-10-CM

## 2011-10-10 DIAGNOSIS — K219 Gastro-esophageal reflux disease without esophagitis: Secondary | ICD-10-CM | POA: Insufficient documentation

## 2011-10-13 ENCOUNTER — Other Ambulatory Visit: Payer: Self-pay | Admitting: *Deleted

## 2011-10-13 DIAGNOSIS — R1011 Right upper quadrant pain: Secondary | ICD-10-CM

## 2011-10-14 ENCOUNTER — Other Ambulatory Visit: Payer: Self-pay | Admitting: Family Medicine

## 2011-10-14 DIAGNOSIS — Z1231 Encounter for screening mammogram for malignant neoplasm of breast: Secondary | ICD-10-CM

## 2011-10-21 ENCOUNTER — Encounter (HOSPITAL_COMMUNITY)
Admission: RE | Admit: 2011-10-21 | Discharge: 2011-10-21 | Disposition: A | Discharge status: Home / Self Care | Payer: Medicaid Other | Source: Ambulatory Visit | Attending: Internal Medicine | Admitting: Internal Medicine

## 2011-10-21 DIAGNOSIS — R1011 Right upper quadrant pain: Secondary | ICD-10-CM | POA: Insufficient documentation

## 2011-10-21 MED ORDER — TECHNETIUM TC 99M MEBROFENIN IV KIT
5.0000 | PACK | Freq: Once | INTRAVENOUS | Status: AC | PRN
  Administered 2011-10-21: 5 via INTRAVENOUS

## 2011-10-28 ENCOUNTER — Telehealth: Payer: Self-pay | Admitting: *Deleted

## 2011-10-28 NOTE — Telephone Encounter (Signed)
Patient returned my call.  Patient will arrive at 1400 for 1500 appointment.

## 2011-10-28 NOTE — Telephone Encounter (Signed)
Left message for patient to call back if she is able/willing to move her appointment time up by 1/2 hour.

## 2011-10-29 ENCOUNTER — Encounter: Payer: Self-pay | Admitting: Internal Medicine

## 2011-10-29 ENCOUNTER — Ambulatory Visit (AMBULATORY_SURGERY_CENTER): Payer: Medicaid Other | Admitting: Internal Medicine

## 2011-10-29 ENCOUNTER — Encounter: Payer: Medicaid Other | Admitting: Internal Medicine

## 2011-10-29 VITALS — BP 126/59 | HR 84 | Temp 98.8°F | Resp 96 | Ht 61.0 in | Wt 187.0 lb

## 2011-10-29 DIAGNOSIS — R1319 Other dysphagia: Secondary | ICD-10-CM

## 2011-10-29 DIAGNOSIS — K210 Gastro-esophageal reflux disease with esophagitis: Secondary | ICD-10-CM

## 2011-10-29 DIAGNOSIS — Z86010 Personal history of colonic polyps: Secondary | ICD-10-CM

## 2011-10-29 DIAGNOSIS — K219 Gastro-esophageal reflux disease without esophagitis: Secondary | ICD-10-CM

## 2011-10-29 DIAGNOSIS — Z1211 Encounter for screening for malignant neoplasm of colon: Secondary | ICD-10-CM

## 2011-10-29 MED ORDER — SODIUM CHLORIDE 0.9 % IV SOLN: 500.0000 mL | INTRAVENOUS | Status: DC

## 2011-10-29 NOTE — Progress Notes (Signed)
The pt went to the rest room and had no complaints noted in the recovery room. Maw  Patient did not experience any of the following events: a burn prior to discharge; a fall within the facility; wrong site/side/patient/procedure/implant event; or a hospital transfer or hospital admission upon discharge from the facility. [phone]) Patient did not have preoperative order for IV antibiotic SSI prophylaxis. [phone])

## 2011-10-29 NOTE — Op Note (Signed)
[facility] [address] [place], [place]  [address]  COLONOSCOPY PROCEDURE REPORT  PATIENT:  [name], [name]  MR#:  [id number] BIRTHDATE:  06/12/1956, 55 yrs. old  GENDER:  female ENDOSCOPIST:  [name]. Juanda Chance, MD REF. BY:  Delbert Harness, M.D. 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 Digital rectal exam was performed and revealed no rectal masses. The LB PCF-H180AL [id number] endoscope was introduced through the anus and advanced to the cecum, which was identified by both the appendix and ileocecal valve.  The quality of the prep was good, using MoviPrep.  The instrument was then slowly withdrawn as the colon was fully examined. <<PROCEDUREIMAGES>>  FINDINGS:  No polyps or cancers were seen (see image1, image2, and image3).   Retroflexed views in the rectum revealed no abnormalities.    The scope was then withdrawn from the patient and the procedure completed.  COMPLICATIONS:  None ENDOSCOPIC IMPRESSION: 1) No polyps or cancers 2) Normal colonoscopy RECOMMENDATIONS: 1) High fiber diet.  REPEAT EXAM:  In 10 year(s) for.  ______________________________ [name]. Juanda Chance, MD  CC:  n. eSIGNED:   [name]. Steele Stracener at 10/29/2011 04:24 PM  Laureen Ochs, [phone]

## 2011-10-29 NOTE — Patient Instructions (Signed)
Please follow the dilatation diet for the rest of the day a handout of the diet was given to your care partner.  Continue taking your acid reducing medication.  Per Dr. Juanda Chance stop smoking.  Also per Dr. Juanda Chance follow the high fiber diet after the dilatation diet is completed.  Please call if you have any questions or concerns.   YOU HAD AN ENDOSCOPIC PROCEDURE TODAY AT THE [facility]: Refer to the procedure report that was given to you for any specific questions about what was found during the examination.  If the procedure report does not answer your questions, please call your gastroenterologist to clarify.  If you requested that your care partner not be given the details of your procedure findings, then the procedure report has been included in a sealed envelope for you to review at your convenience later.  YOU SHOULD EXPECT: Some feelings of bloating in the abdomen. Passage of more gas than usual.  Walking can help get rid of the air that was put into your GI tract during the procedure and reduce the bloating. If you had a lower endoscopy (such as a colonoscopy or flexible sigmoidoscopy) you may notice spotting of blood in your stool or on the toilet paper. If you underwent a bowel prep for your procedure, then you may not have a normal bowel movement for a few days.  DIET:   Drink plenty of fluids but you should avoid alcoholic beverages for 24 hours.  Please follow the dilatation diet the rest of the day and resume your prior diet Thursday AM if swallowing is okay.   ACTIVITY: Your care partner should take you home directly after the procedure.  You should plan to take it easy, moving slowly for the rest of the day.  You can resume normal activity the day after the procedure however you should NOT DRIVE or use heavy machinery for 24 hours (because of the sedation medicines used during the test).    SYMPTOMS TO REPORT IMMEDIATELY: A gastroenterologist can be reached at any hour.   During normal business hours, 8:30 AM to 5:00 PM Monday through Friday, call [phone].  After hours and on weekends, please call the GI answering service at [phone] who will take a message and have the physician on call contact you.   Following lower endoscopy (colonoscopy or flexible sigmoidoscopy):  Excessive amounts of blood in the stool  Significant tenderness or worsening of abdominal pains  Swelling of the abdomen that is new, acute  Fever of 100F or higher  Following upper endoscopy (EGD)  Vomiting of blood or coffee ground material  New chest pain or pain under the shoulder blades  Painful or persistently difficult swallowing  New shortness of breath  Fever of 100F or higher  Black, tarry-looking stools  FOLLOW UP: If any biopsies were taken you will be contacted by phone or by letter within the next 1-3 weeks.  Call your gastroenterologist if you have not heard about the biopsies in 3 weeks.  Our staff will call the home number listed on your records the next business day following your procedure to check on you and address any questions or concerns that you may have at that time regarding the information given to you following your procedure. This is a courtesy call and so if there is no answer at the home number and we have not heard from you through the emergency physician on call, we will assume that you have returned to  your regular daily activities without incident.  SIGNATURES/CONFIDENTIALITY: You and/or your care partner have signed paperwork which will be entered into your electronic medical record.  These signatures attest to the fact that that the information above on your After Visit Summary has been reviewed and is understood.  Full responsibility of the confidentiality of this discharge information lies with you and/or your care-partner.

## 2011-10-29 NOTE — Op Note (Signed)
[facility] [address] [place], [place]  [address]  ENDOSCOPY PROCEDURE REPORT  PATIENT:  [name], [name]  MR#:  [id number] BIRTHDATE:  1956/06/10, 55 yrs. old  GENDER:  female  ENDOSCOPIST:  [name]. Juanda Chance, MD Referred by:  [facility] Harness, M.D.  PROCEDURE DATE:  10/29/2011 PROCEDURE:  EGD with biopsy, 43239, Maloney Dilation of Esophagus ASA CLASS:  Class II INDICATIONS:  dysphagia, GERD last EGD 2004 with dil, but no stricture, continues to smoke Ba esophagram shows dismotility  MEDICATIONS:   MAC sedation, administered by CRNA, propofol (Diprivan) 250 mg TOPICAL ANESTHETIC:  none  DESCRIPTION OF PROCEDURE:   After the risks benefits and alternatives of the procedure were thoroughly explained, informed consent was obtained.  The LB GIF-H180 [id number] endoscope was introduced through the mouth and advanced to the second portion of the duodenum, without limitations.  The instrument was slowly withdrawn as the mucosa was fully examined. <<PROCEDUREIMAGES>>  nl esophagus. no stricture With standard forceps, a biopsy was obtained and sent to pathology. maloney dilator g-e junction 53F maloney dil (see image4). passed without diff    Retroflexed views revealed no abnormalities.    The scope was then withdrawn from the patient and the procedure completed.  COMPLICATIONS:  None  ENDOSCOPIC IMPRESSION: 1) Nl esophagus s/p passage of 53F maloney dil RECOMMENDATIONS: 1) Await biopsy results continue PPI stop smoking  REPEAT EXAM:  In 0 year(s) for.  ______________________________ [name]. Juanda Chance, MD  CC:  n. eSIGNED:   [name]. Dreshaun Stene at 10/29/2011 04:20 PM  Laureen Ochs, [phone]

## 2011-10-30 ENCOUNTER — Telehealth: Payer: Self-pay | Admitting: *Deleted

## 2011-10-30 NOTE — Telephone Encounter (Signed)
  Follow up Call-  Call back number 10/29/2011  Post procedure Call Back phone  # [phone]  Permission to leave phone message Yes     Patient questions:  Do you have a fever, pain , or abdominal swelling? no Pain Score  0 *  Have you tolerated food without any problems? yes  Have you been able to return to your normal activities? yes  Do you have any questions about your discharge instructions: Diet   no Medications  no Follow up visit  no  Do you have questions or concerns about your Care? no  Actions: * If pain score is 4 or above: No action needed, pain <4.

## 2011-11-03 ENCOUNTER — Ambulatory Visit: Payer: Medicaid Other

## 2011-11-04 ENCOUNTER — Encounter: Payer: Self-pay | Admitting: Internal Medicine

## 2011-11-13 ENCOUNTER — Ambulatory Visit
Admission: RE | Admit: 2011-11-13 | Discharge: 2011-11-13 | Disposition: A | Discharge status: Home / Self Care | Payer: Medicaid Other | Source: Ambulatory Visit | Attending: Family Medicine | Admitting: Family Medicine

## 2011-11-13 DIAGNOSIS — Z1231 Encounter for screening mammogram for malignant neoplasm of breast: Secondary | ICD-10-CM

## 2011-11-14 ENCOUNTER — Other Ambulatory Visit: Payer: Self-pay | Admitting: Family Medicine

## 2011-11-14 MED ORDER — SIMVASTATIN 40 MG PO TABS: 40.0000 mg | ORAL_TABLET | Freq: Every evening | ORAL | Status: DC

## 2011-11-27 ENCOUNTER — Encounter: Payer: Self-pay | Admitting: Family Medicine

## 2011-11-27 ENCOUNTER — Ambulatory Visit (INDEPENDENT_AMBULATORY_CARE_PROVIDER_SITE_OTHER): Payer: Medicaid Other | Admitting: Family Medicine

## 2011-11-27 DIAGNOSIS — G43019 Migraine without aura, intractable, without status migrainosus: Secondary | ICD-10-CM

## 2011-11-27 DIAGNOSIS — G43909 Migraine, unspecified, not intractable, without status migrainosus: Secondary | ICD-10-CM

## 2011-11-27 MED ORDER — DIPHENHYDRAMINE HCL 50 MG/ML IJ SOLN
25.0000 mg | Freq: Once | INTRAMUSCULAR | Status: AC
  Administered 2011-11-27: 25 mg via INTRAMUSCULAR

## 2011-11-27 MED ORDER — KETOROLAC TROMETHAMINE 30 MG/ML IJ SOLN
30.0000 mg | Freq: Once | INTRAMUSCULAR | Status: AC
  Administered 2011-11-27: 30 mg via INTRAMUSCULAR

## 2011-11-27 MED ORDER — DIPHENHYDRAMINE HCL 50 MG/ML IJ SOLN: 50.0000 mg | Freq: Once | INTRAMUSCULAR | Status: DC

## 2011-11-27 MED ORDER — DEXAMETHASONE SODIUM PHOSPHATE 10 MG/ML IJ SOLN
10.0000 mg | Freq: Once | INTRAMUSCULAR | Status: AC
  Administered 2011-11-27: 10 mg via INTRAMUSCULAR

## 2011-11-30 NOTE — Progress Notes (Signed)
  Subjective:    Patient ID: [name], female    DOB: 02/11/1956, 56 y.o.   MRN: [id number]  HPI  1. Headache:  Here with complaint of headache.  Has been ongoing x1 week.  Described as pressure all over associated with photophobia.  States this is what her typical headaches are like.  She did follow up with neurology and was given nortriptyline, however she states she has tried this before and it "messed up her kidneys" so she is not going to take this and would like this added to her allergy list.  She denies any focal numbness or weakness associated with headache.  Uses hydrocodone as well as NSAIDS at home for therapy, also on topamax for prevention.  Typically has prolonged migraine every couple of months.  Injection of toradol/benadryl/decadron typically resolves  Review of Systems     Objective:   Physical Exam  Constitutional: She is oriented to person, place, and time. She appears well-nourished.       Appears uncomfortable  HENT:  Head: Normocephalic and atraumatic.  Eyes: Conjunctivae and EOM are normal. Pupils are equal, round, and reactive to light.  Neck: Neck supple.  Cardiovascular: Normal rate.   Pulmonary/Chest: Effort normal and breath sounds normal.  Neurological: She is alert and oriented to person, place, and time. No cranial nerve deficit.          Assessment & Plan:

## 2011-11-30 NOTE — Assessment & Plan Note (Addendum)
Typical migraine pattern.  She also likely has a component of analgesic rebound, given that she tries to cover up initial migraine with daily analgesics. Have instructed her before on limiting use of analgesics. Given toradol/benadryl/dexamethasone today, which relieved symptoms.  Instructed to let neuro know that she is not taking nortriptyline and check to see if other options available.  Will add nortriptyline to allergy list

## 2011-12-15 ENCOUNTER — Telehealth: Payer: Self-pay | Admitting: Family Medicine

## 2011-12-15 DIAGNOSIS — M1990 Unspecified osteoarthritis, unspecified site: Secondary | ICD-10-CM

## 2011-12-15 NOTE — Telephone Encounter (Signed)
Patient is calling for a referral to go back to see Dr. Thurston Hole.  She has continued problems with her knee and was told the referral has expired.  She has an appt on 5/16 so she needs that referral before then.

## 2011-12-15 NOTE — Telephone Encounter (Signed)
History of DJD, will refer back to [facility].

## 2011-12-18 ENCOUNTER — Telehealth: Payer: Self-pay | Admitting: *Deleted

## 2011-12-18 MED ORDER — DICYCLOMINE HCL 20 MG PO TABS: 20.0000 mg | ORAL_TABLET | Freq: Three times a day (TID) | ORAL | Status: DC

## 2011-12-18 NOTE — Telephone Encounter (Signed)
Refilled, sent to [facility] [address]

## 2011-12-18 NOTE — Telephone Encounter (Signed)
Pharmacy is calling for refill on dicyclomine. Faxed request is in MD box from 05/07. Will forward this message to Dr. Ashley Royalty.

## 2011-12-19 NOTE — Telephone Encounter (Signed)
I called and spoke to [facility] office earlier this week and gave them the referral.  I will also go ahead and fax the paper referral to them also.  Sibbie notified.  Ileana Ladd

## 2011-12-19 NOTE — Telephone Encounter (Signed)
Pt has an appt at [facility] on 5/16 and she had called 1st of week to get referral from Dr Ashley Royalty, she is calling today to let us know they haven't gotten the referral yet.  pls advise

## 2011-12-22 ENCOUNTER — Ambulatory Visit (INDEPENDENT_AMBULATORY_CARE_PROVIDER_SITE_OTHER): Payer: Medicaid Other | Admitting: Family Medicine

## 2011-12-22 ENCOUNTER — Encounter: Payer: Self-pay | Admitting: Family Medicine

## 2011-12-22 VITALS — BP 106/70 | HR 89 | Temp 97.9°F | Ht 61.0 in | Wt 186.3 lb

## 2011-12-22 DIAGNOSIS — J4489 Other specified chronic obstructive pulmonary disease: Secondary | ICD-10-CM

## 2011-12-22 DIAGNOSIS — J449 Chronic obstructive pulmonary disease, unspecified: Secondary | ICD-10-CM

## 2011-12-22 MED ORDER — PREDNISONE 50 MG PO TABS: ORAL_TABLET | ORAL | Status: AC

## 2011-12-22 NOTE — Assessment & Plan Note (Addendum)
Will treat with 7 day course of prednisone for exacerbation.  Will hold on antibiotics as likely viral, patient has multiple intolerances antibiotics- will defer from repeated use of broad spectrum antibiotics.  Advised red flags for return.  Discussed continued smoking, increasing frequency of flares.  She is not ready to accept further smoking cessation intervention at this time.

## 2011-12-22 NOTE — Patient Instructions (Signed)
LET us KNOW HOW WE CAN HELP YOU STOP SMOKING!  Chronic Obstructive Pulmonary Disease Chronic obstructive pulmonary disease (COPD) is a condition in which airflow from the lungs is restricted. The lungs can never return to normal, but there are measures you can take which will improve them and make you feel better. CAUSES    Smoking.   Exposure to secondhand smoke.   Breathing in irritants (pollution, cigarette smoke, strong smells, aerosol sprays, paint fumes).   History of lung infections.  TREATMENT   Treatment focuses on making you comfortable (supportive care). Your caregiver may prescribe medications (inhaled or pills) to help improve your breathing. HOME CARE INSTRUCTIONS    If you smoke, stop smoking.   Avoid exposure to smoke, chemicals, and fumes that aggravate your breathing.   Take antibiotic medicines as directed by your caregiver.   Avoid medicines that dry up your system and slow down the elimination of secretions (antihistamines and cough syrups). This decreases respiratory capacity and may lead to infections.   Drink enough water and fluids to keep your urine clear or pale yellow. This loosens secretions.   Use humidifiers at home and at your bedside if they do not make breathing difficult.   Receive all protective vaccines your caregiver suggests, especially pneumococcal and influenza.   Use home oxygen as suggested.   Stay active. Exercise and physical activity will help maintain your ability to do things you want to do.   Eat a healthy diet.  SEEK MEDICAL CARE IF:    You develop pus-like mucus (sputum).   Breathing is more labored or exercise becomes difficult to do.   You are running out of the medicine you take for your breathing.  SEEK IMMEDIATE MEDICAL CARE IF:    You have a rapid heart rate.   You have agitation, confusion, tremors, or are in a stupor (family members may need to observe this).   It becomes difficult to breathe.   You develop  chest pain.   You have a fever.  MAKE SURE YOU:    Understand these instructions.   Will watch your condition.   Will get help right away if you are not doing well or get worse.  Document Released: 05/07/2005 Document Revised: 07/17/2011 Document Reviewed: 09/27/2010 [facility] Patient Information 2012 [employer], [place].

## 2011-12-22 NOTE — Progress Notes (Signed)
  Subjective:    Patient ID: [name], female    DOB: 1956-03-27, 56 y.o.   MRN: [id number]  HPI Cough with sputum x 2-3 days.  NO fever.  More dyspnea than at baseline. Using albuterol several times daily.  Patient states she has had multiple courses of prednisone over the past year.  Continues to smoke,  Thinking of quitting.  I have reviewed patient's  PMH, FH, and Social history and Medications as related to this visit. COPD/ SMoker Review of Systems see HPI   Objective:   Physical Exam GEN: Alert & Oriented, No acute distress, strong smell of smoke. CV:  Regular Rate & Rhythm, no murmur Respiratory:  Normal work of breathing, scattered expiratory wheezes.         Assessment & Plan:

## 2011-12-23 ENCOUNTER — Telehealth: Payer: Self-pay | Admitting: Family Medicine

## 2011-12-23 NOTE — Telephone Encounter (Signed)
Will forward message to Dr. Matthews. 

## 2011-12-23 NOTE — Telephone Encounter (Signed)
Patient asking for hydrocodone to be sent to pharmacy for pain to knees.  Having difficulty with joints locking.  Called [facility]'s office to get rx, but was told to call primary's office instead.

## 2011-12-24 MED ORDER — HYDROCODONE-ACETAMINOPHEN 5-325 MG PO TABS: 1.0000 | ORAL_TABLET | Freq: Three times a day (TID) | ORAL | Status: AC | PRN

## 2011-12-24 NOTE — Telephone Encounter (Signed)
[facility] [phone] [facility] Refills.

## 2011-12-31 ENCOUNTER — Encounter: Payer: Self-pay | Admitting: Family Medicine

## 2011-12-31 ENCOUNTER — Ambulatory Visit (INDEPENDENT_AMBULATORY_CARE_PROVIDER_SITE_OTHER): Payer: Medicaid Other | Admitting: Family Medicine

## 2011-12-31 VITALS — BP 124/78 | HR 84 | Temp 97.2°F | Ht 61.0 in | Wt 186.3 lb

## 2011-12-31 DIAGNOSIS — M1710 Unilateral primary osteoarthritis, unspecified knee: Secondary | ICD-10-CM

## 2011-12-31 MED ORDER — HYDROCODONE-ACETAMINOPHEN 5-500 MG PO TABS: 1.0000 | ORAL_TABLET | Freq: Three times a day (TID) | ORAL | Status: AC | PRN

## 2011-12-31 NOTE — Progress Notes (Signed)
Procedure Note: Right knee injection with steroids and lidocaine  Informed Consent Obtained for above procedure. All Risks, benefits, alternative, complications reviewed with patient and understood. Time Out performed. Using topical spray local anesthesia was obtained. Using a 25 gauge needle 4:1 kenalog to marcaine was injected into the knee using the medial approach.  The patient tolerated the procedure well. No complications occurred. Patient counseled on post-operative care.  Edd Arbour, MD

## 2011-12-31 NOTE — Patient Instructions (Signed)
It was great to see you today!  You need to keep your appointment to see Dr. Wyline Mood in June.  I injected your knee today with steroids.   I have prescribed you a short course of vicodin.  I do believe surgery will be the definitive treatment for this knee.

## 2011-12-31 NOTE — Progress Notes (Signed)
  Subjective:   Patient ID: [name], female DOB: 05-17-1956 56 y.o. MRN: [id number] HPI:  1. 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 With history of injections at [facility]. Films from march'12 show DJD of the knee. Location: right knee, pain medial side.  Onset: has been acute on chronic  Time period of: years, but 3 months duration for this painful period  Severity is described as moderate to severe.  Aggravating: walking, standing Alleviating: steroid injections, rest.  Associated sx/sn: no fever, no hot joint, no other joints involved.   History  Substance Use Topics  . Smoking status: Current Everyday Smoker -- 1.0 packs/day    Types: Cigarettes  . Smokeless tobacco: Never Used   Comment: does not want to quitt  . Alcohol Use: No    Review of Systems: Pertinent items are noted in HPI.  Labs Reviewed: films of knee from 2012 Reviewed Chart Review for last notes.     Objective:   Filed Vitals:   12/31/11 1429  BP: 124/78  Pulse: 84  Temp: 97.2 F (36.2 C)  TempSrc: Oral  Height: 5\' 1"  (1.549 m)  Weight: 186 lb 4.8 oz (84.505 kg)   Physical Exam: General: c/f nad, pleasant Right knee: Neg. Apprehension Drawers intact Strength normal ttp of medial joint line.   Assessment & Plan:   Tobacco Abuse:  Advised patient about the risks of smoking to health.

## 2011-12-31 NOTE — Assessment & Plan Note (Signed)
I injected her knee 4:1 kenalog/marcaine. Short course of vicodin 20 tabs. She has an appointment with Dr. Wyline Mood in June. I think she needs a knee replacement looking at her films from last year.

## 2012-01-19 ENCOUNTER — Other Ambulatory Visit: Payer: Self-pay | Admitting: *Deleted

## 2012-01-19 MED ORDER — ALBUTEROL SULFATE HFA 108 (90 BASE) MCG/ACT IN AERS: 2.0000 | INHALATION_SPRAY | Freq: Four times a day (QID) | RESPIRATORY_TRACT | Status: DC | PRN

## 2012-01-20 ENCOUNTER — Encounter: Payer: Self-pay | Admitting: Family Medicine

## 2012-01-20 ENCOUNTER — Ambulatory Visit (INDEPENDENT_AMBULATORY_CARE_PROVIDER_SITE_OTHER): Payer: Medicaid Other | Admitting: Family Medicine

## 2012-01-20 ENCOUNTER — Telehealth: Payer: Self-pay | Admitting: *Deleted

## 2012-01-20 VITALS — BP 111/75 | HR 98 | Temp 97.1°F | Ht 61.0 in | Wt 184.0 lb

## 2012-01-20 DIAGNOSIS — B372 Candidiasis of skin and nail: Secondary | ICD-10-CM | POA: Insufficient documentation

## 2012-01-20 DIAGNOSIS — J40 Bronchitis, not specified as acute or chronic: Secondary | ICD-10-CM

## 2012-01-20 DIAGNOSIS — J209 Acute bronchitis, unspecified: Secondary | ICD-10-CM | POA: Insufficient documentation

## 2012-01-20 MED ORDER — MOXIFLOXACIN HCL 400 MG PO TABS: 400.0000 mg | ORAL_TABLET | Freq: Every day | ORAL | Status: DC

## 2012-01-20 MED ORDER — LEVOFLOXACIN 500 MG PO TABS: 500.0000 mg | ORAL_TABLET | Freq: Every day | ORAL | Status: AC

## 2012-01-20 MED ORDER — NYSTATIN 100000 UNIT/GM EX CREA: TOPICAL_CREAM | Freq: Two times a day (BID) | CUTANEOUS | Status: DC

## 2012-01-20 MED ORDER — PREDNISONE 50 MG PO TABS: ORAL_TABLET | ORAL | Status: DC

## 2012-01-20 NOTE — Telephone Encounter (Signed)
Will send levaquin instead, as this drug will likely treat condition appropriately and is preferred.

## 2012-01-20 NOTE — Telephone Encounter (Signed)
Pharmacy notified to cancel Avelox

## 2012-01-20 NOTE — Assessment & Plan Note (Signed)
Chronic bronchitis with productive green sputum for over one month. In no respiratory distress, no hypoxia. Will treat with respiratory flouraquinolone and prednisone given patient's intolerance to multiple antibiotics in the past including doxycycline, erythromycin, penicillin. Again stressed the importance of smoking cessation to prohibit recurrent infections and decline of respiratory function. Patient is not interested in actively pursuing cessation. Tips were provided. Advised to f/u in 2 weeks to assess response as patient may warrant imaging if not improved.

## 2012-01-20 NOTE — Patient Instructions (Signed)
NIce to meet you. Glad you are thinking about quitting smoking, this is important to stop progression of lung disease. Pick up prednisone and avelox. Make an appointment in 1-2 weeks for check up.  Smoking Cessation, Tips for Success YOU CAN QUIT SMOKING If you are ready to quit smoking, congratulations! You have chosen to help yourself be healthier. Cigarettes bring nicotine, tar, carbon monoxide, and other irritants into your body. Your lungs, heart, and blood vessels will be able to work better without these poisons. There are many different ways to quit smoking. Nicotine gum, nicotine patches, a nicotine inhaler, or nicotine nasal spray can help with physical craving. Hypnosis, support groups, and medicines help break the habit of smoking. Here are some tips to help you quit for good.  Throw away all cigarettes.   Clean and remove all ashtrays from your home, work, and car.   On a card, write down your reasons for quitting. Carry the card with you and read it when you get the urge to smoke.   Cleanse your body of nicotine. Drink enough water and fluids to keep your urine clear or pale yellow. Do this after quitting to flush the nicotine from your body.   Learn to predict your moods. Do not let a bad situation be your excuse to have a cigarette. Some situations in your life might tempt you into wanting a cigarette.   Never have "just one" cigarette. It leads to wanting another and another. Remind yourself of your decision to quit.   Change habits associated with smoking. If you smoked while driving or when feeling stressed, try other activities to replace smoking. Stand up when drinking your coffee. Brush your teeth after eating. Sit in a different chair when you read the paper. Avoid alcohol while trying to quit, and try to drink fewer caffeinated beverages. Alcohol and caffeine may urge you to smoke.   Avoid foods and drinks that can trigger a desire to smoke, such as sugary or spicy  foods and alcohol.   Ask people who smoke not to smoke around you.   Have something planned to do right after eating or having a cup of coffee. Take a walk or exercise to perk you up. This will help to keep you from overeating.   Try a relaxation exercise to calm you down and decrease your stress. Remember, you may be tense and nervous for the first 2 weeks after you quit, but this will pass.   Find new activities to keep your hands busy. Play with a pen, coin, or rubber band. Doodle or draw things on paper.   Brush your teeth right after eating. This will help cut down on the craving for the taste of tobacco after meals. You can try mouthwash, too.   Use oral substitutes, such as lemon drops, carrots, a cinnamon stick, or chewing gum, in place of cigarettes. Keep them handy so they are available when you have the urge to smoke.   When you have the urge to smoke, try deep breathing.   Designate your home as a nonsmoking area.   If you are a heavy smoker, ask your caregiver about a prescription for nicotine chewing gum. It can ease your withdrawal from nicotine.   Reward yourself. Set aside the cigarette money you save and buy yourself something nice.   Look for support from others. Join a support group or smoking cessation program. Ask someone at home or at work to help you with your plan to  quit smoking.   Always ask yourself, "Do I need this cigarette or is this just a reflex?" Tell yourself, "Today, I choose not to smoke," or "I do not want to smoke." You are reminding yourself of your decision to quit, even if you do smoke a cigarette.  HOW WILL I FEEL WHEN I QUIT SMOKING?  The benefits of not smoking start within days of quitting.   You may have symptoms of withdrawal because your body is used to nicotine (the addictive substance in cigarettes). You may crave cigarettes, be irritable, feel very hungry, cough often, get headaches, or have difficulty concentrating.   The withdrawal  symptoms are only temporary. They are strongest when you first quit but will go away within 10 to 14 days.   When withdrawal symptoms occur, stay in control. Think about your reasons for quitting. Remind yourself that these are signs that your body is healing and getting used to being without cigarettes.   Remember that withdrawal symptoms are easier to treat than the major diseases that smoking can cause.   Even after the withdrawal is over, expect periodic urges to smoke. However, these cravings are generally short-lived and will go away whether you smoke or not. Do not smoke!   If you relapse and smoke again, do not lose hope. Most smokers quit 3 times before they are successful.   If you relapse, do not give up! Plan ahead and think about what you will do the next time you get the urge to smoke.  LIFE AS A NONSMOKER: MAKE IT FOR A MONTH, MAKE IT FOR LIFE Day 1: Hang this page where you will see it every day. Day 2: Get rid of all ashtrays, matches, and lighters. Day 3: Drink water. Breathe deeply between sips. Day 4: Avoid places with smoke-filled air, such as bars, clubs, or the smoking section of restaurants. Day 5: Keep track of how much money you save by not smoking. Day 6: Avoid boredom. Keep a good book with you or go to the movies. Day 7: Reward yourself! One week without smoking! Day 8: Make a dental appointment to get your teeth cleaned. Day 9: Decide how you will turn down a cigarette before it is offered to you. Day 10: Review your reasons for quitting. Day 11: Distract yourself. Stay active to keep your mind off smoking and to relieve tension. Take a walk, exercise, read a book, do a crossword puzzle, or try a new hobby. Day 12: Exercise. Get off the bus before your stop or use stairs instead of escalators. Day 13: Call on friends for support and encouragement. Day 14: Reward yourself! Two weeks without smoking! Day 15: Practice deep breathing exercises. Day 16: Bet a  friend that you can stay a nonsmoker. Day 17: Ask to sit in nonsmoking sections of restaurants. Day 18: Hang up "No Smoking" signs. Day 19: Think of yourself as a nonsmoker. Day 20: Each morning, tell yourself you will not smoke. Day 21: Reward yourself! Three weeks without smoking! Day 22: Think of smoking in negative ways. Remember how it stains your teeth, gives you bad breath, and leaves you short of breath. Day 23: Eat a nutritious breakfast. Day 24:Do not relive your days as a smoker. Day 25: Hold a pencil in your hand when talking on the telephone. Day 26: Tell all your friends you do not smoke. Day 27: Think about how much better food tastes. Day 28: Remember, one cigarette is one too many. Day 29:  Take up a hobby that will keep your hands busy. Day 30: Congratulations! One month without smoking! Give yourself a big reward. Your caregiver can direct you to community resources or hospitals for support, which may include:  Group support.   Education.   Hypnosis.   Subliminal therapy.  Document Released: 04/25/2004 Document Revised: 07/17/2011 Document Reviewed: 05/14/2009 [facility] Patient Information 2012 [place], [place].

## 2012-01-20 NOTE — Telephone Encounter (Signed)
PA required for Avelox. Form given to MD. Placed on her desk .  She is in clinic today.

## 2012-01-20 NOTE — Assessment & Plan Note (Signed)
Appearance of mild yeast infection. Topical nystatin prescribed. Followup when necessary.

## 2012-01-20 NOTE — Progress Notes (Signed)
  Subjective:    Patient ID: [name], female    DOB: 09-14-1955, 55 y.o.   MRN: [id number]  HPI  1. Cough. Patient with history of COPD complains of cough for past one month. She was treated near the onset with steroids and states this never improved. Is productive of green sputum and some mild dyspnea. Had only rare chest pains with cough, then resolves. Taking albuterol BID for several days and spiriva as prescribed. Has been treated for bronchitis several times in the past, states the only thing that makes it go away is antibiotics. Has multiple antibiotic allergies. Is planning on going on a vacation to the beach later this weekend and desires treatment prior to her departure. Active smoker of 1 ppd, barriers are that her entire family smokes and she is not actively considering cessation.   No LE edema, palpitations, wheezing, abdominal pain.  2. inguinal rash. Patient having bright red, pruritic rash in her left inguinal area. Has tried Neosporin. Not improving. Denies any pain, swelling, nausea, malaise.  Review of Systems See HPI otherwise negative.  reports that she has been smoking Cigarettes.  She has been smoking about 1 pack per day. She has never used smokeless tobacco.     Objective:   Physical Exam  Vitals reviewed. Constitutional: She is oriented to person, place, and time. She appears well-developed and well-nourished. No distress.  HENT:  Head: Normocephalic and atraumatic.  Nose: Nose normal.  Mouth/Throat: Oropharynx is clear and moist. No oropharyngeal exudate.  Eyes: EOM are normal. Pupils are equal, round, and reactive to light.  Neck: Neck supple.  Cardiovascular: Normal rate, regular rhythm, normal heart sounds and intact distal pulses.   No murmur heard. Pulmonary/Chest: Effort normal and breath sounds normal. She has no wheezes.       Slightly diminished BS throughout. No focal findings.  Abdominal: Soft.  Musculoskeletal: She exhibits no edema and no  tenderness.  Neurological: She is alert and oriented to person, place, and time. She exhibits normal muscle tone. Coordination normal.  Skin: No rash noted. She is not diaphoretic.       Left inguinal erythema c/w candida.  Psychiatric: She has a normal mood and affect.       Assessment & Plan:

## 2012-01-21 NOTE — Telephone Encounter (Signed)
Yesterday before MD changed antibiotic to Levaquin we went ahead and faxed PA for Avelox to medicaid. Today we received notice that   Avelox has been approved. Called Dr. Cristal Ford and she advises  will continue  with Levaquin.   Pharmacy notified also.

## 2012-01-30 ENCOUNTER — Encounter: Payer: Self-pay | Admitting: Family Medicine

## 2012-01-30 ENCOUNTER — Ambulatory Visit (INDEPENDENT_AMBULATORY_CARE_PROVIDER_SITE_OTHER): Payer: Medicaid Other | Admitting: Family Medicine

## 2012-01-30 ENCOUNTER — Telehealth: Payer: Self-pay | Admitting: *Deleted

## 2012-01-30 ENCOUNTER — Ambulatory Visit (HOSPITAL_COMMUNITY)
Admission: RE | Admit: 2012-01-30 | Discharge: 2012-01-30 | Disposition: A | Discharge status: Home / Self Care | Payer: Medicaid Other | Source: Ambulatory Visit | Attending: Family Medicine | Admitting: Family Medicine

## 2012-01-30 VITALS — BP 117/73 | HR 72 | Ht 61.0 in | Wt 187.2 lb

## 2012-01-30 DIAGNOSIS — Z86718 Personal history of other venous thrombosis and embolism: Secondary | ICD-10-CM

## 2012-01-30 DIAGNOSIS — M7989 Other specified soft tissue disorders: Secondary | ICD-10-CM | POA: Insufficient documentation

## 2012-01-30 DIAGNOSIS — R609 Edema, unspecified: Secondary | ICD-10-CM

## 2012-01-30 DIAGNOSIS — J40 Bronchitis, not specified as acute or chronic: Secondary | ICD-10-CM

## 2012-01-30 MED ORDER — PREDNISONE 50 MG PO TABS: ORAL_TABLET | ORAL | Status: AC

## 2012-01-30 NOTE — Patient Instructions (Signed)
1.5 Gram Low Sodium Diet A 1.5 gram sodium diet restricts the amount of sodium in the diet to no more than 1.5 g or 1500 mg daily. The American Heart Association recommends Americans over the age of 20 to consume no more than 1500 mg of sodium each day to reduce the risk of developing high blood pressure. Research also shows that limiting sodium may reduce heart attack and stroke risk. Many foods contain sodium for flavor and sometimes as a preservative. When the amount of sodium in a diet needs to be low, it is important to know what to look for when choosing foods and drinks. The following includes some information and guidelines to help make it easier for you to adapt to a low sodium diet. QUICK TIPS  Do not add salt to food.   Avoid convenience items and fast food.   Choose unsalted snack foods.   Buy lower sodium products, often labeled as "lower sodium" or "no salt added."   Check food labels to learn how much sodium is in 1 serving.   When eating at a restaurant, ask that your food be prepared with less salt or none, if possible.  READING FOOD LABELS FOR SODIUM INFORMATION The nutrition facts label is a good place to find how much sodium is in foods. Look for products with no more than 400 mg of sodium per serving. Remember that 1.5 g = 1500 mg. The food label may also list foods as:  Sodium-free: Less than 5 mg in a serving.   Very low sodium: 35 mg or less in a serving.   Low-sodium: 140 mg or less in a serving.   Light in sodium: 50% less sodium in a serving. For example, if a food that usually has 300 mg of sodium is changed to become light in sodium, it will have 150 mg of sodium.   Reduced sodium: 25% less sodium in a serving. For example, if a food that usually has 400 mg of sodium is changed to reduced sodium, it will have 300 mg of sodium.  CHOOSING FOODS Grains  Avoid: Salted crackers and snack items. Some cereals, including instant hot cereals. Bread stuffing and  biscuit mixes. Seasoned rice or pasta mixes.   Choose: Unsalted snack items. Low-sodium cereals, oats, puffed wheat and rice, shredded wheat. English muffins and bread. Pasta.  Meats  Avoid: Salted, canned, smoked, spiced, pickled meats, including fish and poultry. Bacon, ham, sausage, cold cuts, hot dogs, anchovies.   Choose: Low-sodium canned tuna and salmon. Fresh or frozen meat, poultry, and fish.  Dairy  Avoid: Processed cheese and spreads. Cottage cheese. Buttermilk and condensed milk. Regular cheese.   Choose: Milk. Low-sodium cottage cheese. Yogurt. Sour cream. Low-sodium cheese.  Fruits and Vegetables  Avoid: Regular canned vegetables. Regular canned tomato sauce and paste. Frozen vegetables in sauces. Olives. Pickles. Relishes. Sauerkraut.   Choose: Low-sodium canned vegetables. Low-sodium tomato sauce and paste. Frozen or fresh vegetables. Fresh and frozen fruit.  Condiments  Avoid: Canned and packaged gravies. Worcestershire sauce. Tartar sauce. Barbecue sauce. Soy sauce. Steak sauce. Ketchup. Onion, garlic, and table salt. Meat flavorings and tenderizers.   Choose: Fresh and dried herbs and spices. Low-sodium varieties of mustard and ketchup. Lemon juice. Tabasco sauce. Horseradish.  SAMPLE 1.5 GRAM SODIUM MEAL PLAN Breakfast / Sodium (mg)  1 cup low-fat milk / 143 mg   1 whole-wheat English muffin / 240 mg   1 tbs heart-healthy margarine / 153 mg   1 hard-boiled egg /   139 mg   1 small orange / 0 mg  Lunch / Sodium (mg)  1 cup raw carrots / 76 mg   2 tbs no salt added peanut butter / 5 mg   2 slices whole-wheat bread / 270 mg   1 tbs jelly / 6 mg    cup red grapes / 2 mg  Dinner / Sodium (mg)  1 cup whole-wheat pasta / 2 mg   1 cup low-sodium tomato sauce / 73 mg   3 oz lean ground beef / 57 mg   1 small side salad (1 cup raw spinach leaves,  cup cucumber,  cup yellow bell pepper) with 1 tsp olive oil and 1 tsp red wine vinegar / 25 mg  Snack /  Sodium (mg)  1 container low-fat vanilla yogurt / 107 mg   3 graham cracker squares / 127 mg  Nutrient Analysis  Calories: 1745   Protein: 75 g   Carbohydrate: 237 g   Fat: 57 g   Sodium: 1425 mg  Document Released: 07/28/2005 Document Revised: 07/17/2011 Document Reviewed: 10/29/2009 [employer] Patient Information 2012 [employer]. 

## 2012-01-30 NOTE — Progress Notes (Signed)
  Subjective:    Patient ID: [name], female    DOB: 1955/08/13, 56 y.o.   MRN: [id number]  HPI Leg swelling x 2-3 days.  Legs have also been mildly painful.  Pt states that she spent vacation at beach and noticed leg swelling at end of vacation.  Leg swelling bilateral in nature though R greater than L. No trauma, weakness, or paresthesias.  This has never happened before.  Pt denies high sodium intake.  Baseline 1PPD smoker Has some baseline dysnea and mild orthopnea. These have not worsened.  Pt has been taking NSAIDs for pain     Review of Systems See HPI, otherwise ROS is negative     Objective:   Physical Exam Gen: up in chair, NAD HEENT: NCAT, EOMI, TMs clear bilaterally CV: RRR, no murmurs auscultated PULM: CTAB, no wheezes, rales, rhoncii ABD: S/NT/+ bowel sounds  EXT: 2+ peripheral pulses, calves symmetric, mild medial mid calf pain on R leg,          Assessment & Plan:

## 2012-01-30 NOTE — Telephone Encounter (Signed)
PA # A [id number] .[name]

## 2012-01-30 NOTE — Progress Notes (Signed)
VASCULAR LAB PRELIMINARY  PRELIMINARY  PRELIMINARY  PRELIMINARY  Bilateral lower extremity venous duplex completed.    Preliminary report: Negative for deep and superficial vein thrombus bilaterally Deiondra Denley, 01/30/2012, 10:03 PM

## 2012-01-31 LAB — COMPREHENSIVE METABOLIC PANEL
ALT: 8 U/L (ref 0–35)
Albumin: 4 g/dL (ref 3.5–5.2)
CO2: 24 mEq/L (ref 19–32)
Potassium: 3.5 mEq/L (ref 3.5–5.3)
Sodium: 139 mEq/L (ref 135–145)
Total Bilirubin: 0.3 mg/dL (ref 0.3–1.2)
Total Protein: 6.4 g/dL (ref 6.0–8.3)

## 2012-01-31 LAB — CBC
HCT: 34.5 % — ABNORMAL LOW (ref 36.0–46.0)
MCHC: 34.5 g/dL (ref 30.0–36.0)
MCV: 91.5 fL (ref 78.0–100.0)
Platelets: 246 10*3/uL (ref 150–400)
RDW: 14.5 % (ref 11.5–15.5)

## 2012-01-31 LAB — TSH: TSH: 2.473 u[IU]/mL (ref 0.350–4.500)

## 2012-01-31 LAB — BRAIN NATRIURETIC PEPTIDE: Brain Natriuretic Peptide: 14.4 pg/mL (ref 0.0–100.0)

## 2012-02-03 ENCOUNTER — Telehealth: Payer: Self-pay | Admitting: Family Medicine

## 2012-02-03 NOTE — Telephone Encounter (Signed)
Looks like all lab results were normal.  LE doppler negative for DVT.

## 2012-02-03 NOTE — Telephone Encounter (Signed)
Called patient and informed lab results were normal.[name], [name]

## 2012-02-03 NOTE — Telephone Encounter (Signed)
Patient is calling for her lab results.  

## 2012-02-03 NOTE — Telephone Encounter (Signed)
Forward to PCP to review labs. 

## 2012-02-04 ENCOUNTER — Encounter: Payer: Self-pay | Admitting: Family Medicine

## 2012-02-04 DIAGNOSIS — R609 Edema, unspecified: Secondary | ICD-10-CM | POA: Insufficient documentation

## 2012-02-04 NOTE — Assessment & Plan Note (Signed)
Likely secondary to body habitus and salt intake.  Given LE pain; will obtain LE duplex to r/o DVT.  Discussed low sodium intake.  Discussed CV red flags for reevaluation.

## 2012-02-04 NOTE — Assessment & Plan Note (Signed)
Flare of this today.  Will treat with steroids.

## 2012-02-06 ENCOUNTER — Ambulatory Visit (INDEPENDENT_AMBULATORY_CARE_PROVIDER_SITE_OTHER): Payer: Medicaid Other | Admitting: Family Medicine

## 2012-02-06 VITALS — BP 108/74 | HR 77 | Ht 61.0 in | Wt 183.0 lb

## 2012-02-06 DIAGNOSIS — G43019 Migraine without aura, intractable, without status migrainosus: Secondary | ICD-10-CM

## 2012-02-06 MED ORDER — DEXAMETHASONE SODIUM PHOSPHATE 4 MG/ML IJ SOLN: 4.0000 mg | Freq: Once | INTRAMUSCULAR | Status: DC

## 2012-02-06 MED ORDER — KETOROLAC TROMETHAMINE 60 MG/2ML IM SOLN
60.0000 mg | Freq: Once | INTRAMUSCULAR | Status: AC
  Administered 2012-02-06: 60 mg via INTRAMUSCULAR

## 2012-02-06 MED ORDER — DEXAMETHASONE SODIUM PHOSPHATE 10 MG/ML IJ SOLN
10.0000 mg | Freq: Once | INTRAMUSCULAR | Status: AC
  Administered 2012-02-06: 10 mg via INTRAMUSCULAR

## 2012-02-06 MED ORDER — DIPHENHYDRAMINE HCL 50 MG/ML IJ SOLN
50.0000 mg | Freq: Once | INTRAMUSCULAR | Status: AC
  Administered 2012-02-06: 50 mg via INTRAMUSCULAR

## 2012-02-06 NOTE — Patient Instructions (Addendum)
It was good to see today We are treating you for acute migraine Be sure to followup with the neurologist about this issue New medications may need to be added to your current regimen If your headache symptoms worsen please call us or go to the emergency room Call if any questions.

## 2012-02-06 NOTE — Progress Notes (Signed)
  Subjective:    Patient ID: [name], female    DOB: 06-Nov-1955, 56 y.o.   MRN: [id number]  HPI Patient presents today with chief complaint of headache. Patient reports a baseline history of recurrent intractable migraines. Is currently followed by neurology for this. Current prophylactic medications include Topamax at 100 mg twice a day Patient states she's had recurrent migraines for the past 2-3 weeks. Symptoms for the classic nature with frontal headache with associated nausea as well as photophobia. No reported hours. Patient denies any strenuous activity. Pain is in minimally to mildly controlled with over-the-counter NSAIDs analgesics.   Review of Systems See HPI, otherwise ROS negative     Objective:   Physical Exam Gen: up in chair, NAD HEENT: NCAT, EOMI, TMs clear bilaterally CV: RRR, no murmurs auscultated PULM: CTAB, no wheezes, rales, rhoncii ABD: S/NT/+ bowel sounds  EXT: 2+ peripheral pulses     Assessment & Plan:

## 2012-02-06 NOTE — Assessment & Plan Note (Signed)
We'll repeat cocktail of Decadron, Toradol, Benadryl as given by Dr. Ashley Royalty previously. This is been effective in the past. Reviewed medications. Topamax his currently at max dosing. Other medication to consider would be beta blockers if blood pressure tolerates. Other meds Keppra, calcium channel blockers, Neurontin. Neuro red flags discussed. Handout given.

## 2012-02-09 ENCOUNTER — Encounter: Payer: Self-pay | Admitting: Family Medicine

## 2012-02-11 ENCOUNTER — Ambulatory Visit: Payer: Medicaid Other | Admitting: Family Medicine

## 2012-02-16 ENCOUNTER — Other Ambulatory Visit: Payer: Self-pay | Admitting: *Deleted

## 2012-02-17 ENCOUNTER — Other Ambulatory Visit: Payer: Self-pay | Admitting: *Deleted

## 2012-02-17 MED ORDER — OMEPRAZOLE 40 MG PO CPDR: 40.0000 mg | DELAYED_RELEASE_CAPSULE | Freq: Every day | ORAL | Status: DC

## 2012-03-02 ENCOUNTER — Ambulatory Visit: Payer: Medicaid Other | Admitting: Family Medicine

## 2012-03-05 ENCOUNTER — Emergency Department (HOSPITAL_COMMUNITY)
Admission: EM | Admit: 2012-03-05 | Discharge: 2012-03-05 | Disposition: A | Discharge status: Home / Self Care | Payer: Medicaid Other | Attending: Emergency Medicine | Admitting: Emergency Medicine

## 2012-03-05 ENCOUNTER — Encounter (HOSPITAL_COMMUNITY): Payer: Self-pay

## 2012-03-05 DIAGNOSIS — F17200 Nicotine dependence, unspecified, uncomplicated: Secondary | ICD-10-CM | POA: Insufficient documentation

## 2012-03-05 DIAGNOSIS — E119 Type 2 diabetes mellitus without complications: Secondary | ICD-10-CM | POA: Insufficient documentation

## 2012-03-05 DIAGNOSIS — K219 Gastro-esophageal reflux disease without esophagitis: Secondary | ICD-10-CM | POA: Insufficient documentation

## 2012-03-05 DIAGNOSIS — M129 Arthropathy, unspecified: Secondary | ICD-10-CM | POA: Insufficient documentation

## 2012-03-05 DIAGNOSIS — I10 Essential (primary) hypertension: Secondary | ICD-10-CM | POA: Insufficient documentation

## 2012-03-05 DIAGNOSIS — E785 Hyperlipidemia, unspecified: Secondary | ICD-10-CM | POA: Insufficient documentation

## 2012-03-05 DIAGNOSIS — G4733 Obstructive sleep apnea (adult) (pediatric): Secondary | ICD-10-CM | POA: Insufficient documentation

## 2012-03-05 DIAGNOSIS — J4489 Other specified chronic obstructive pulmonary disease: Secondary | ICD-10-CM | POA: Insufficient documentation

## 2012-03-05 DIAGNOSIS — J449 Chronic obstructive pulmonary disease, unspecified: Secondary | ICD-10-CM | POA: Insufficient documentation

## 2012-03-05 DIAGNOSIS — G43909 Migraine, unspecified, not intractable, without status migrainosus: Secondary | ICD-10-CM | POA: Insufficient documentation

## 2012-03-05 MED ORDER — METOCLOPRAMIDE HCL 5 MG/ML IJ SOLN
10.0000 mg | Freq: Once | INTRAMUSCULAR | Status: AC
  Administered 2012-03-05: 10 mg via INTRAVENOUS
  Filled 2012-03-05: qty 2

## 2012-03-05 MED ORDER — SODIUM CHLORIDE 0.9 % IV BOLUS (SEPSIS)
1000.0000 mL | Freq: Once | INTRAVENOUS | Status: AC
  Administered 2012-03-05: 1000 mL via INTRAVENOUS

## 2012-03-05 MED ORDER — HYDROCODONE-ACETAMINOPHEN 5-500 MG PO TABS: 1.0000 | ORAL_TABLET | Freq: Four times a day (QID) | ORAL | Status: DC | PRN

## 2012-03-05 MED ORDER — KETOROLAC TROMETHAMINE 30 MG/ML IJ SOLN
30.0000 mg | Freq: Once | INTRAMUSCULAR | Status: AC
  Administered 2012-03-05: 30 mg via INTRAVENOUS
  Filled 2012-03-05: qty 1

## 2012-03-05 MED ORDER — HYDROMORPHONE HCL PF 1 MG/ML IJ SOLN
1.0000 mg | Freq: Once | INTRAMUSCULAR | Status: AC
  Administered 2012-03-05: 1 mg via INTRAVENOUS
  Filled 2012-03-05: qty 1

## 2012-03-05 NOTE — ED Notes (Signed)
Pt complains of headache worsening over time, sts feels tongue tied , hx of migraines, feels the same but worse.

## 2012-03-05 NOTE — ED Provider Notes (Signed)
History     CSN: [id number]  Arrival date & time 03/05/12  1251   First MD Initiated Contact with Patient 03/05/12 1405      Chief Complaint  Patient presents with  . Headache     HPI The patient reports severe headache for the past 3 days.  She has a long-standing history of migraine headaches and complicated migraines.  At times she has had difficulty with her speech.  This time she denies unilateral weakness.  She has no difficulty with her speech at this time.  She's tried her home medications without improvement in her symptoms.  She reports this headache today similar to all of her prior headaches.  No fevers or chills.  No neck pain.  Her pain is moderate to severe at this time.  Her pain is worsened by light and loud noises Past Medical History  Diagnosis Date  . Allergic rhinitis   . COPD (chronic obstructive pulmonary disease)   . Depression   . Diabetes mellitus     No meds since weight loss  . Hypertension   . Hyperlipidemia   . GERD (gastroesophageal reflux disease)   . Seizures   . OSA on CPAP   . Glaucoma   . DJD (degenerative joint disease)   . Neck pain     Cervical disc  . Migraine   . IBS (irritable bowel syndrome)   . Esophageal stricture   . Diverticulosis   . Arthritis   . H/O blood clots     Past Surgical History  Procedure Date  . Abdominal hysterectomy   . Tubal ligation   . Knee arthroscopy     left  . Carpal tunnel release     right  . Shoulder surgery     left    Family History  Problem Relation Age of Onset  . Heart disease Maternal Grandfather   . Heart disease      uncle  . Diabetes Maternal Grandfather   . Breast cancer Maternal Aunt   . Alcohol abuse Father   . Bipolar disorder Son   . Muscular dystrophy Son   . Diabetes      aunts  . Diabetes      uncles  . Heart disease Sister   . Lung cancer Father   . Colon cancer Neg Hx   . Lung cancer Mother   . Other Sister     blood clotting disorder  . Stroke Mother   .  Heart disease Son     History  Substance Use Topics  . Smoking status: Current Everyday Smoker -- 1.0 packs/day    Types: Cigarettes  . Smokeless tobacco: Never Used   Comment: does not want to quitt  . Alcohol Use: No    OB History    Grav Para Term Preterm Abortions TAB SAB Ect Mult Living                  Review of Systems  All other systems reviewed and are negative.    Allergies  Penicillins; Azithromycin; Clarithromycin; Fluticasone-salmeterol; Nortriptyline; Doxycycline hyclate; and Sulfonamide derivatives  Home Medications   Current Outpatient Rx  Name Route Sig Dispense Refill  . ACETAMINOPHEN 325 MG PO TABS Oral Take 975 mg by mouth every 6 (six) hours as needed. For pain    . ALBUTEROL SULFATE HFA 108 (90 BASE) MCG/ACT IN AERS Inhalation Inhale 2 puffs into the lungs every 4 (four) hours as needed. For shortness of breath    .  ALBUTEROL SULFATE (2.5 MG/3ML) 0.083% IN NEBU Nebulization Take 2.5 mg by nebulization every 6 (six) hours as needed. For shortness of breath    . VITAMIN D3 2000 UNITS PO TABS Oral Take 1 tablet by mouth daily.      Marland Kitchen DICYCLOMINE HCL 20 MG PO TABS Oral Take 1 tablet (20 mg total) by mouth 4 (four) times daily -  before meals and at bedtime. 120 tablet 6  . ESOMEPRAZOLE MAGNESIUM 40 MG PO CPDR Oral Take 1 capsule (40 mg total) by mouth 2 (two) times daily. 30 capsule 0  . FLUTICASONE PROPIONATE 50 MCG/ACT NA SUSP Nasal Place 2 sprays into the nose daily.    . IBUPROFEN 200 MG PO TABS Oral Take 600 mg by mouth every 6 (six) hours as needed. For pain    . LACTULOSE 10 GM/15ML PO SOLN  take 30-40mL as needed for soft bowel movements.  disp     . LAMOTRIGINE 150 MG PO TABS Oral Take 150 mg by mouth 2 (two) times daily.     Marland Kitchen LORATADINE 10 MG PO TABS Oral Take 10 mg by mouth daily.      . MELOXICAM 7.5 MG PO TABS Oral Take 7.5 mg by mouth daily.    Marland Kitchen OMEPRAZOLE 40 MG PO CPDR Oral Take 40 mg by mouth 2 (two) times daily.    Marland Kitchen PRAMOXINE-HC  1-1 % EX CREA Topical Apply 1 application topically daily as needed. For itching    . SIMVASTATIN 40 MG PO TABS Oral Take 1 tablet (40 mg total) by mouth every evening. 30 tablet 6  . TIOTROPIUM BROMIDE MONOHYDRATE 18 MCG IN CAPS Inhalation Place 18 mcg into inhaler and inhale daily.    . TOPIRAMATE 100 MG PO TABS Oral Take 100 mg by mouth 2 (two) times daily.      Marland Kitchen HYDROCODONE-ACETAMINOPHEN 5-500 MG PO TABS Oral Take 1-2 tablets by mouth every 6 (six) hours as needed for pain. 15 tablet 0  . HYDROXYZINE HCL 50 MG PO TABS Oral Take 62.5 mg by mouth every 6 (six) hours as needed. For itching      BP 113/74  Pulse 79  Temp 97.9 F (36.6 C) (Oral)  Resp 20  SpO2 97%  Physical Exam  Nursing note and vitals reviewed. Constitutional: She is oriented to person, place, and time. She appears well-developed and well-nourished. No distress.  HENT:  Head: Normocephalic and atraumatic.  Eyes: EOM are normal. Pupils are equal, round, and reactive to light.  Neck: Normal range of motion.  Cardiovascular: Normal rate, regular rhythm and normal heart sounds.   Pulmonary/Chest: Effort normal and breath sounds normal.  Abdominal: Soft. She exhibits no distension. There is no tenderness.  Musculoskeletal: Normal range of motion.  Neurological: She is alert and oriented to person, place, and time.       5/5 strength in major muscle groups of  bilateral upper and lower extremities. Speech normal. No facial asymetry.   Skin: Skin is warm and dry.  Psychiatric: She has a normal mood and affect. Judgment normal.    ED Course  Procedures   Labs Reviewed - No data to display No results found.   1. Migraine headache       MDM  Typical migraine headache for the pt. Non focal neuro exam. No recent head trauma. No fever. Doubt meningitis. Doubt intracranial bleed. Doubt normal pressure hydrocephalus. No indication for imaging. Will treat with migraine cocktail and reevaluate  3:23 PM The  patient  feels better at this time. Dc home with pcp follow up        Lyanne Co, MD 03/05/12 1527

## 2012-03-15 ENCOUNTER — Ambulatory Visit (INDEPENDENT_AMBULATORY_CARE_PROVIDER_SITE_OTHER): Payer: Medicaid Other | Admitting: Family Medicine

## 2012-03-15 ENCOUNTER — Encounter: Payer: Self-pay | Admitting: Family Medicine

## 2012-03-15 VITALS — BP 142/86 | HR 82 | Ht 61.0 in | Wt 182.0 lb

## 2012-03-15 DIAGNOSIS — G43019 Migraine without aura, intractable, without status migrainosus: Secondary | ICD-10-CM

## 2012-03-15 DIAGNOSIS — E119 Type 2 diabetes mellitus without complications: Secondary | ICD-10-CM

## 2012-03-15 DIAGNOSIS — M1710 Unilateral primary osteoarthritis, unspecified knee: Secondary | ICD-10-CM

## 2012-03-15 DIAGNOSIS — G43909 Migraine, unspecified, not intractable, without status migrainosus: Secondary | ICD-10-CM

## 2012-03-15 LAB — POCT GLYCOSYLATED HEMOGLOBIN (HGB A1C): Hemoglobin A1C: 5.2

## 2012-03-15 MED ORDER — DIPHENHYDRAMINE HCL 50 MG/ML IJ SOLN
25.0000 mg | Freq: Once | INTRAMUSCULAR | Status: AC
  Administered 2012-03-15: 25 mg via INTRAMUSCULAR

## 2012-03-15 MED ORDER — HYDROCODONE-ACETAMINOPHEN 5-500 MG PO TABS: 1.0000 | ORAL_TABLET | Freq: Two times a day (BID) | ORAL | Status: AC | PRN

## 2012-03-15 MED ORDER — DEXAMETHASONE SODIUM PHOSPHATE 10 MG/ML IJ SOLN
10.0000 mg | Freq: Once | INTRAMUSCULAR | Status: AC
  Administered 2012-03-15: 10 mg via INTRAMUSCULAR

## 2012-03-15 MED ORDER — CYCLOBENZAPRINE HCL 10 MG PO TABS: 10.0000 mg | ORAL_TABLET | Freq: Three times a day (TID) | ORAL | Status: AC | PRN

## 2012-03-15 NOTE — Patient Instructions (Addendum)
Thank you for coming in today, it was good to see you Hopefully with your headaches and knee pain better controlled you will be able to walk better Please keep me updated on what is happening with your knee, have your orthopedist send over his notes.   Let me know what your neurologist says as well.

## 2012-03-23 NOTE — Assessment & Plan Note (Signed)
Followed by orthopedics.  I explained to her that I do not have a whole lot more to offer her from my side.  Weight loss would certainly help but she is limited in exercise 2/2 to pain.  She will contiinue to follow with Dr. Thurston Hole and await his further recommendations.

## 2012-03-23 NOTE — Assessment & Plan Note (Signed)
No neurological deficits on exam and typical migraine pattern for her.  She likely also has chronic daily headache due to frequent analgesic use as well.  Will avoid toradol today  And use benadryl/dexamethasone for now.

## 2012-03-23 NOTE — Progress Notes (Signed)
  Subjective:    Patient ID: [name], female    DOB: [birth year], 56 y.o.   MRN: [id number]  HPI 1.  Headache:  Patient here with complaint of headache.  History of migraines and states that this feels like typical migraine.  Pain is "all over head"  And has been ongoing for almost one week.  She is followed by neurology for this and takes topamax for prevention.  She denies nausea and/or photophobia.  She takes occasional hydrocodone for headaches.  She denies dizziness, weakness, unsteady gait.   2. Knee pain: R  Knee pain x6 months.  Evaluated by Dr. Thurston Hole. Has had a couple of steroid injections, which have helped some.  States she was told to wait a few months and may consider viscoreplacement vs. Knee replacement.  She does have swelling occasionally.  Denies locking of knee. Pain does affect her gait and she has had a couple of near falls due to pain.      Review of Systems Per HPI    Objective:   Physical Exam  Constitutional: She appears well-nourished. No distress.  HENT:  Head: Normocephalic and atraumatic.  Eyes: Conjunctivae and EOM are normal. Pupils are equal, round, and reactive to light.  Neck: Neck supple.  Cardiovascular: Normal rate and regular rhythm.   Pulmonary/Chest: Effort normal.  Musculoskeletal:       R knee no swelling on exam. She does have tenderness along the joint line of the right knee.  She walks with mildly antalgic gait.   Neurological: She is alert. No cranial nerve deficit. Coordination normal.          Assessment & Plan:

## 2012-04-01 ENCOUNTER — Telehealth: Payer: Self-pay | Admitting: Family Medicine

## 2012-04-01 NOTE — Telephone Encounter (Signed)
Reviewed meds for pt as [facility] transferred list w/Nikki at [facility]

## 2012-04-01 NOTE — Telephone Encounter (Signed)
[facility] would like to discuss medications on this patient med list.

## 2012-04-02 ENCOUNTER — Telehealth: Payer: Self-pay | Admitting: Family Medicine

## 2012-04-02 NOTE — Telephone Encounter (Signed)
Has new pharmacy and needs new strips (Easy Max) and needles for her DM [phone] -[facility] -attn: Marge

## 2012-04-05 ENCOUNTER — Telehealth: Payer: Self-pay | Admitting: Family Medicine

## 2012-04-05 NOTE — Telephone Encounter (Signed)
Fwd. To PCP for refill. .[name]  

## 2012-04-05 NOTE — Telephone Encounter (Signed)
[facility] needs an Rx for the Albuterol for her inhaler.  The [facility] is the one needing it.

## 2012-04-05 NOTE — Telephone Encounter (Signed)
Fwd. To PCP for refill

## 2012-04-06 ENCOUNTER — Other Ambulatory Visit: Payer: Self-pay | Admitting: Family Medicine

## 2012-04-06 MED ORDER — CYCLOBENZAPRINE HCL 10 MG PO TABS: 10.0000 mg | ORAL_TABLET | Freq: Three times a day (TID) | ORAL | Status: AC | PRN

## 2012-04-06 MED ORDER — FLUTICASONE PROPIONATE 50 MCG/ACT NA SUSP: 2.0000 | Freq: Every day | NASAL | Status: DC

## 2012-04-06 MED ORDER — BLOOD GLUCOSE TEST VI STRP: ORAL_STRIP | Status: DC

## 2012-04-06 MED ORDER — MELOXICAM 7.5 MG PO TABS: 7.5000 mg | ORAL_TABLET | Freq: Every day | ORAL | Status: DC

## 2012-04-06 MED ORDER — CYCLOBENZAPRINE HCL 10 MG PO TABS: 10.0000 mg | ORAL_TABLET | Freq: Three times a day (TID) | ORAL | Status: DC | PRN

## 2012-04-06 MED ORDER — VITAMIN D3 50 MCG (2000 UT) PO TABS: 1.0000 | ORAL_TABLET | Freq: Every day | ORAL | Status: DC

## 2012-04-06 MED ORDER — HYDROCODONE-ACETAMINOPHEN 5-325 MG PO TABS: 1.0000 | ORAL_TABLET | Freq: Every day | ORAL | Status: AC | PRN

## 2012-04-06 NOTE — Telephone Encounter (Signed)
Patient is calling because [facility] says they haven't received the refills on her medications.

## 2012-04-07 NOTE — Telephone Encounter (Signed)
Medications were sent electronically to [facility].Ulanda Tackett, [name]

## 2012-04-14 ENCOUNTER — Ambulatory Visit: Payer: Medicaid Other | Admitting: Family Medicine

## 2012-04-15 ENCOUNTER — Ambulatory Visit (INDEPENDENT_AMBULATORY_CARE_PROVIDER_SITE_OTHER): Payer: Medicaid Other | Admitting: Family Medicine

## 2012-04-15 ENCOUNTER — Encounter: Payer: Self-pay | Admitting: Family Medicine

## 2012-04-15 VITALS — BP 102/70 | HR 87 | Ht 61.0 in | Wt 183.0 lb

## 2012-04-15 DIAGNOSIS — G43019 Migraine without aura, intractable, without status migrainosus: Secondary | ICD-10-CM

## 2012-04-15 DIAGNOSIS — G43909 Migraine, unspecified, not intractable, without status migrainosus: Secondary | ICD-10-CM

## 2012-04-15 MED ORDER — DEXAMETHASONE SODIUM PHOSPHATE 10 MG/ML IJ SOLN
10.0000 mg | Freq: Once | INTRAMUSCULAR | Status: AC
  Administered 2012-04-15: 10 mg via INTRAMUSCULAR

## 2012-04-15 MED ORDER — KETOROLAC TROMETHAMINE 60 MG/2ML IM SOLN
60.0000 mg | Freq: Once | INTRAMUSCULAR | Status: AC
  Administered 2012-04-15: 60 mg via INTRAMUSCULAR

## 2012-04-15 MED ORDER — DIPHENHYDRAMINE HCL 50 MG/ML IJ SOLN
50.0000 mg | Freq: Once | INTRAMUSCULAR | Status: AC
  Administered 2012-04-15: 50 mg via INTRAMUSCULAR

## 2012-04-15 NOTE — Patient Instructions (Addendum)
Thank you for coming in today You are having your typical migraine headache Please rest lots and drink lots of fluids Please follow up with Dr. Ashley Royalty and your neurologist for your headaches  Migraine Headache A migraine is very bad pain on one or both sides of your head. The cause of a migraine is not always known. A migraine can be triggered or caused by different things, such as:  Alcohol.   Smoking.   Stress.   Periods (menstruation) in women.   Aged cheeses.   Foods or drinks that contain nitrates, glutamate, aspartame, or tyramine.   Lack of sleep.   Chocolate.   Caffeine.   Hunger.   Medicines, such as nitroglycerine (used to treat chest pain), birth control pills, estrogen, and some blood pressure medicines.  HOME CARE  Many medicines can help migraine pain or keep migraines from coming back. Your doctor can help you decide on a medicine or treatment program.   If you or your child gets a migraine, it may help to lie down in a dark, quiet room.   Keep a headache journal. This may help find out what is causing the headaches. For example, write down:   What you eat and drink.   How much sleep you get.   Any change to your diet or medicines.  GET HELP RIGHT AWAY IF:   The medicine does not work.   The pain begins again.   The neck is stiff.   You have trouble seeing.   The muscles are weak or you lose muscle control.   You have new symptoms.   You lose your balance.   You have trouble walking.   You feel faint or pass out.  MAKE SURE YOU:   Understand these instructions.   Will watch this condition.   Will get help right away if you are not doing well or get worse.  Document Released: 05/06/2008 Document Revised: 07/17/2011 Document Reviewed: 04/02/2009 [facility] Patient Information 2012 [place], [place].

## 2012-04-20 NOTE — Progress Notes (Signed)
  Subjective:    Patient ID: [name], female    DOB: [birth year], 56 y.o.   MRN: [id number]  HPI CC: HA  HA: Typical presentation per pt. Started 2 wks ago. Associated w/ bilateral leg weakness. Impairs sleep. Phonopohobia. Improves w/ rest. Worse w/ movement. Denies any change in sensation/vision, LOC, palpitations. No improvement w/ flexeril. Mild improvement w/ tylenol and norco. Compliant w/ topamax.    Review of Systems Per hpi    Objective:   Physical Exam  Gen: Mild distress, quiet HEENT: PERRL, no cervical lymphadenopathy, maxillary and frontal sinus non-painful to palpation.  CV: RRR, no m/r/g Neuro: Strength 3+ and symmetrical in upper and lowe extremtites. Gait normal, finger to nose normal, EOMI, PERRL, sensation intact and bilat in feet, CN intact       Assessment & Plan:

## 2012-04-20 NOTE — Assessment & Plan Note (Signed)
Will repeat usual cocktail of Decadron, benadryl, and toradol. Pt seeing neurologist for HA and recommend she see them again soon or PCP.

## 2012-04-22 ENCOUNTER — Ambulatory Visit: Payer: Medicaid Other | Admitting: Family Medicine

## 2012-04-25 ENCOUNTER — Observation Stay (HOSPITAL_COMMUNITY)
Admission: EM | Admit: 2012-04-25 | Discharge: 2012-04-27 | Disposition: A | Discharge status: Home / Self Care | Payer: Medicaid Other | Attending: Family Medicine | Admitting: Family Medicine

## 2012-04-25 ENCOUNTER — Encounter (HOSPITAL_COMMUNITY): Payer: Self-pay | Admitting: Emergency Medicine

## 2012-04-25 ENCOUNTER — Emergency Department (HOSPITAL_COMMUNITY): Payer: Medicaid Other

## 2012-04-25 DIAGNOSIS — G4733 Obstructive sleep apnea (adult) (pediatric): Secondary | ICD-10-CM | POA: Diagnosis present

## 2012-04-25 DIAGNOSIS — G40909 Epilepsy, unspecified, not intractable, without status epilepticus: Secondary | ICD-10-CM | POA: Insufficient documentation

## 2012-04-25 DIAGNOSIS — J449 Chronic obstructive pulmonary disease, unspecified: Secondary | ICD-10-CM | POA: Diagnosis present

## 2012-04-25 DIAGNOSIS — I10 Essential (primary) hypertension: Secondary | ICD-10-CM | POA: Insufficient documentation

## 2012-04-25 DIAGNOSIS — R0602 Shortness of breath: Secondary | ICD-10-CM | POA: Insufficient documentation

## 2012-04-25 DIAGNOSIS — J40 Bronchitis, not specified as acute or chronic: Secondary | ICD-10-CM

## 2012-04-25 DIAGNOSIS — K219 Gastro-esophageal reflux disease without esophagitis: Secondary | ICD-10-CM | POA: Insufficient documentation

## 2012-04-25 DIAGNOSIS — J4489 Other specified chronic obstructive pulmonary disease: Secondary | ICD-10-CM | POA: Insufficient documentation

## 2012-04-25 DIAGNOSIS — E119 Type 2 diabetes mellitus without complications: Secondary | ICD-10-CM | POA: Insufficient documentation

## 2012-04-25 DIAGNOSIS — J209 Acute bronchitis, unspecified: Secondary | ICD-10-CM | POA: Diagnosis present

## 2012-04-25 DIAGNOSIS — E785 Hyperlipidemia, unspecified: Secondary | ICD-10-CM

## 2012-04-25 DIAGNOSIS — F17200 Nicotine dependence, unspecified, uncomplicated: Secondary | ICD-10-CM | POA: Insufficient documentation

## 2012-04-25 DIAGNOSIS — J159 Unspecified bacterial pneumonia: Secondary | ICD-10-CM

## 2012-04-25 DIAGNOSIS — J189 Pneumonia, unspecified organism: Principal | ICD-10-CM | POA: Insufficient documentation

## 2012-04-25 HISTORY — DX: Epilepsy, unspecified, not intractable, without status epilepticus: G40.909

## 2012-04-25 HISTORY — DX: Type 2 diabetes mellitus without complications: E11.9

## 2012-04-25 HISTORY — DX: Unspecified chronic bronchitis: J42

## 2012-04-25 HISTORY — DX: Emphysema, unspecified: J43.9

## 2012-04-25 HISTORY — DX: Other forms of dyspnea: R06.09

## 2012-04-25 HISTORY — DX: Pneumonia, unspecified organism: J18.9

## 2012-04-25 HISTORY — DX: Acute embolism and thrombosis of unspecified deep veins of right lower extremity: I82.401

## 2012-04-25 HISTORY — DX: Dyspnea, unspecified: R06.00

## 2012-04-25 LAB — URINALYSIS, ROUTINE W REFLEX MICROSCOPIC
Bilirubin Urine: NEGATIVE
Ketones, ur: NEGATIVE mg/dL
Nitrite: NEGATIVE
Protein, ur: NEGATIVE mg/dL
pH: 6 (ref 5.0–8.0)

## 2012-04-25 LAB — COMPREHENSIVE METABOLIC PANEL
ALT: 7 U/L (ref 0–35)
AST: 13 U/L (ref 0–37)
Albumin: 3.2 g/dL — ABNORMAL LOW (ref 3.5–5.2)
Alkaline Phosphatase: 142 U/L — ABNORMAL HIGH (ref 39–117)
CO2: 18 mEq/L — ABNORMAL LOW (ref 19–32)
Chloride: 105 mEq/L (ref 96–112)
GFR calc non Af Amer: >90 mL/min (ref >90)
Potassium: 3.2 mEq/L — ABNORMAL LOW (ref 3.5–5.1)
Total Bilirubin: 0.2 mg/dL — ABNORMAL LOW (ref 0.3–1.2)

## 2012-04-25 LAB — CBC WITH DIFFERENTIAL/PLATELET
Basophils Absolute: 0 10*3/uL (ref 0.0–0.1)
Eosinophils Absolute: 0 10*3/uL (ref 0.0–0.7)
Eosinophils Relative: 0 % (ref 0–5)
Lymphs Abs: 0.9 10*3/uL (ref 0.7–4.0)
MCH: 30.9 pg (ref 26.0–34.0)
MCHC: 33.2 g/dL (ref 30.0–36.0)
MCV: 92.9 fL (ref 78.0–100.0)
Monocytes Absolute: 0.4 10*3/uL (ref 0.1–1.0)
Neutrophils Relative %: 93 % — ABNORMAL HIGH (ref 43–77)
Platelets: 232 10*3/uL (ref 150–400)
RDW: 13.6 % (ref 11.5–15.5)

## 2012-04-25 LAB — URINE MICROSCOPIC-ADD ON

## 2012-04-25 MED ORDER — IOHEXOL 350 MG/ML SOLN
100.0000 mL | Freq: Once | INTRAVENOUS | Status: AC | PRN
  Administered 2012-04-25: 100 mL via INTRAVENOUS

## 2012-04-25 MED ORDER — ALBUTEROL SULFATE (5 MG/ML) 0.5% IN NEBU
2.5000 mg | INHALATION_SOLUTION | Freq: Once | RESPIRATORY_TRACT | Status: AC
  Administered 2012-04-25: 2.5 mg via RESPIRATORY_TRACT
  Filled 2012-04-25: qty 0.5

## 2012-04-25 MED ORDER — POTASSIUM CHLORIDE CRYS ER 20 MEQ PO TBCR
40.0000 meq | EXTENDED_RELEASE_TABLET | Freq: Once | ORAL | Status: AC
  Administered 2012-04-25: 40 meq via ORAL
  Filled 2012-04-25: qty 2

## 2012-04-25 MED ORDER — SODIUM CHLORIDE 0.9 % IV SOLN
Freq: Once | INTRAVENOUS | Status: AC
  Administered 2012-04-25: 100 mL/h via INTRAVENOUS

## 2012-04-25 MED ORDER — METHYLPREDNISOLONE SODIUM SUCC 125 MG IJ SOLR
INTRAMUSCULAR | Status: AC
  Administered 2012-04-25: 19:00:00
  Filled 2012-04-25: qty 2

## 2012-04-25 MED ORDER — IPRATROPIUM BROMIDE 0.02 % IN SOLN
RESPIRATORY_TRACT | Status: AC
  Administered 2012-04-25: 19:00:00
  Filled 2012-04-25: qty 2.5

## 2012-04-25 MED ORDER — MOXIFLOXACIN HCL IN NACL 400 MG/250ML IV SOLN
400.0000 mg | INTRAVENOUS | Status: DC
  Administered 2012-04-25: 400 mg via INTRAVENOUS
  Filled 2012-04-25: qty 250

## 2012-04-25 MED ORDER — ASPIRIN 81 MG PO CHEW
162.0000 mg | CHEWABLE_TABLET | Freq: Once | ORAL | Status: AC
  Administered 2012-04-25: 162 mg via ORAL
  Filled 2012-04-25: qty 2

## 2012-04-25 MED ORDER — ACETAMINOPHEN 325 MG PO TABS
ORAL_TABLET | ORAL | Status: AC
  Administered 2012-04-25: 19:00:00
  Filled 2012-04-25: qty 2

## 2012-04-25 MED ORDER — IPRATROPIUM BROMIDE 0.02 % IN SOLN
0.5000 mg | Freq: Once | RESPIRATORY_TRACT | Status: AC
  Administered 2012-04-25: 0.5 mg via RESPIRATORY_TRACT
  Filled 2012-04-25: qty 2.5

## 2012-04-25 MED ORDER — SODIUM CHLORIDE 0.9 % IV BOLUS (SEPSIS)
1000.0000 mL | Freq: Once | INTRAVENOUS | Status: AC
  Administered 2012-04-25: 1000 mL via INTRAVENOUS

## 2012-04-25 NOTE — ED Notes (Signed)
PT back from CT

## 2012-04-25 NOTE — ED Notes (Signed)
MD at bedside. 

## 2012-04-25 NOTE — ED Notes (Signed)
Pt here via EMS for SOB x3 day with increased today stated that she had a fever and green sputum Hx asthma Given 5mg  of albuterol 2.5 mg of Atrovent iv Solumedrol 125mg  in route per EMS

## 2012-04-25 NOTE — ED Provider Notes (Addendum)
History     CSN: [id number]  Arrival date & time 04/25/12  1622   First MD Initiated Contact with Patient 04/25/12 1723      Chief Complaint  Patient presents with  . Shortness of Breath    (Consider location/radiation/quality/duration/timing/severity/associated sxs/prior treatment) HPI Comments: Pt with hx of COPD, HTN, HL and DVT in the past not on coumadin comes in with cc of fevers, cough and sob. Pt has been having some dib for the past few days, she was not attending much to it. The SOB is exertional and due to her COPD. She started having fevers about 2 days ago, and today she developed a cough that is producing yellow/green phlegm. There is no associated n/v/chest pain. She is active smoker. No recent admission, and she has minimal wheezing, for which she has been taking 2 treatments/day.  Patient is a 56 y.o. female presenting with shortness of breath. The history is provided by the patient.  Shortness of Breath  Associated symptoms include cough, shortness of breath and wheezing. Pertinent negatives include no chest pain.    Past Medical History  Diagnosis Date  . Allergic rhinitis   . COPD (chronic obstructive pulmonary disease)   . Depression   . Diabetes mellitus     No meds since weight loss  . Hypertension   . Hyperlipidemia   . GERD (gastroesophageal reflux disease)   . Seizures   . OSA on CPAP   . Glaucoma   . DJD (degenerative joint disease)   . Neck pain     Cervical disc  . Migraine   . IBS (irritable bowel syndrome)   . Esophageal stricture   . Diverticulosis   . Arthritis   . H/O blood clots     Past Surgical History  Procedure Date  . Abdominal hysterectomy   . Tubal ligation   . Knee arthroscopy     left  . Carpal tunnel release     right  . Shoulder surgery     left    Family History  Problem Relation Age of Onset  . Heart disease Maternal Grandfather   . Heart disease      uncle  . Diabetes Maternal Grandfather   . Breast  cancer Maternal Aunt   . Alcohol abuse Father   . Bipolar disorder Son   . Muscular dystrophy Son   . Diabetes      aunts  . Diabetes      uncles  . Heart disease Sister   . Lung cancer Father   . Colon cancer Neg Hx   . Lung cancer Mother   . Other Sister     blood clotting disorder  . Stroke Mother   . Heart disease Son     History  Substance Use Topics  . Smoking status: Current Every Day Smoker -- 1.0 packs/day    Types: Cigarettes  . Smokeless tobacco: Never Used   Comment: does not want to quitt  . Alcohol Use: No    OB History    Grav Para Term Preterm Abortions TAB SAB Ect Mult Living                  Review of Systems  Constitutional: Positive for activity change.  HENT: Negative for facial swelling and neck pain.   Respiratory: Positive for cough, shortness of breath and wheezing.   Cardiovascular: Negative for chest pain.  Gastrointestinal: Negative for nausea, vomiting, abdominal pain, diarrhea, constipation, blood in stool and  abdominal distention.  Genitourinary: Negative for dysuria, hematuria and difficulty urinating.  Skin: Negative for color change.  Neurological: Negative for speech difficulty and headaches.  Hematological: Does not bruise/bleed easily.  Psychiatric/Behavioral: Negative for confusion.    Allergies  Amoxicillin; Penicillins; Azithromycin; Clarithromycin; Fluticasone-salmeterol; Nortriptyline; Doxycycline hyclate; and Sulfonamide derivatives  Home Medications   Current Outpatient Rx  Name Route Sig Dispense Refill  . ACETAMINOPHEN 325 MG PO TABS Oral Take 975 mg by mouth every 6 (six) hours as needed. For pain    . ALBUTEROL SULFATE HFA 108 (90 BASE) MCG/ACT IN AERS Inhalation Inhale 2 puffs into the lungs every 6 (six) hours as needed. Shortness of breath    . ALBUTEROL SULFATE (2.5 MG/3ML) 0.083% IN NEBU Nebulization Take 2.5 mg by nebulization every 6 (six) hours as needed. For shortness of breath    . VITAMIN D3 2000 UNITS  PO TABS Oral Take 1 tablet by mouth daily.    Marland Kitchen DICYCLOMINE HCL 20 MG PO TABS Oral Take 1 tablet (20 mg total) by mouth 4 (four) times daily -  before meals and at bedtime. 120 tablet 6  . ESOMEPRAZOLE MAGNESIUM 40 MG PO CPDR Oral Take 1 capsule (40 mg total) by mouth 2 (two) times daily. 30 capsule 0  . FLUTICASONE PROPIONATE 50 MCG/ACT NA SUSP Nasal Place 2 sprays into the nose daily.    Marland Kitchen BLOOD GLUCOSE TEST VI STRP  Use to test blood glucose daily. 100 each 12  . HYDROXYZINE HCL 50 MG PO TABS Oral Take 62.5 mg by mouth every 6 (six) hours as needed. For itching    . LACTULOSE 10 GM/15ML PO SOLN  take 30-53mL as needed for soft bowel movements.  disp     . LAMOTRIGINE 150 MG PO TABS Oral Take 150 mg by mouth 2 (two) times daily.     Marland Kitchen LORATADINE 10 MG PO TABS Oral Take 10 mg by mouth daily.      . MELOXICAM 7.5 MG PO TABS Oral Take 7.5 mg by mouth daily.    Marland Kitchen OMEPRAZOLE 40 MG PO CPDR Oral Take 40 mg by mouth 2 (two) times daily.    Marland Kitchen PRAMOXINE-HC 1-1 % EX CREA Topical Apply 1 application topically daily as needed. For itching    . SIMVASTATIN 40 MG PO TABS Oral Take 1 tablet (40 mg total) by mouth every evening. 30 tablet 6  . TIOTROPIUM BROMIDE MONOHYDRATE 18 MCG IN CAPS Inhalation Place 18 mcg into inhaler and inhale daily.    . TOPIRAMATE 100 MG PO TABS Oral Take 100 mg by mouth 2 (two) times daily.        BP 90/55  Pulse 106  Temp 101.6 F (38.7 C) (Oral)  Resp 17  SpO2 92%  Physical Exam  Nursing note and vitals reviewed. Constitutional: She is oriented to person, place, and time. She appears well-developed and well-nourished.  HENT:  Head: Normocephalic and atraumatic.  Eyes: EOM are normal. Pupils are equal, round, and reactive to light.  Neck: Neck supple. No JVD present.  Cardiovascular: Normal rate, regular rhythm and normal heart sounds.   No murmur heard. Pulmonary/Chest: Effort normal. No respiratory distress.  Abdominal: Soft. She exhibits no distension. There  is no tenderness. There is no rebound and no guarding.  Neurological: She is alert and oriented to person, place, and time.  Skin: Skin is warm and dry.    ED Course  Procedures (including critical care time)  Labs Reviewed  COMPREHENSIVE METABOLIC  PANEL - Abnormal; Notable for the following:    Potassium 3.2 (*)     CO2 18 (*)     Glucose, Bld 176 (*)     Albumin 3.2 (*)     Alkaline Phosphatase 142 (*)     Total Bilirubin 0.2 (*)     All other components within normal limits  CBC WITH DIFFERENTIAL - Abnormal; Notable for the following:    WBC 18.4 (*)     RBC 3.66 (*)     Hemoglobin 11.3 (*)     HCT 34.0 (*)     Neutrophils Relative 93 (*)     Lymphocytes Relative 5 (*)     Monocytes Relative 2 (*)     Neutro Abs 17.1 (*)     All other components within normal limits  PRO B NATRIURETIC PEPTIDE - Abnormal; Notable for the following:    Pro B Natriuretic peptide (BNP) 412.9 (*)     All other components within normal limits  TROPONIN I  URINALYSIS, ROUTINE W REFLEX MICROSCOPIC   Dg Chest 2 View  04/25/2012  *RADIOLOGY REPORT*  Clinical Data: Shortness of breath.  History of COPD.  CHEST - 2 VIEW  Comparison: 08/31/2011 and 07/11/2011.  Findings: The heart size and mediastinal contours are stable. There is chronic central airway thickening which appears unchanged. No airspace disease, pleural effusion or significant hyperinflation is seen.  The osseous structures are stable.  IMPRESSION: Stable chronic central airway thickening consistent with bronchitis.  No acute cardiopulmonary process identified.   Original Report Authenticated By: Gerrianne Scale, M.D.      No diagnosis found.    MDM  Differential diagnosis includes: ACS syndrome CHF exacerbation Valvular disorder Myocarditis Pericarditis Pericardial effusion Pneumonia COPD exacerbation Pleural effusion Pulmonary edema PE Anemia Musculoskeletal pain  Pt comes in with cc of sob, cough that is new, with  fevers. Her lung exam is benign. She has hx of COPD and DVT - and has tachycardia, tachypnea and mild hypoxia (94% on room air). We will get Xray, if negative, she will get a CT PE.  11:23 PM CT Pe was ordered which shows likely a multifocal pneumonia. Her PSI score is 44, she is class 1. Given her hx of COPD, and the vitals - she might be a good candidate for obs admission.   Derwood Kaplan, MD 04/25/12 2324   Pt accepted by Dr. Leveda Anna and the [facility] teaching service at [facility], MD 04/26/12 0004   Date: 04/26/2012  Rate: 89  Rhythm: normal sinus rhythm  QRS Axis: normal  Intervals: normal  ST/T Wave abnormalities: normal  Conduction Disutrbances:none  Narrative Interpretation:   Old EKG Reviewed: unchanged    Derwood Kaplan, MD 04/26/12 0034

## 2012-04-26 ENCOUNTER — Ambulatory Visit: Payer: Medicaid Other | Admitting: Family Medicine

## 2012-04-26 DIAGNOSIS — J189 Pneumonia, unspecified organism: Secondary | ICD-10-CM

## 2012-04-26 LAB — CREATININE, SERUM: GFR calc Af Amer: >90 mL/min (ref >90)

## 2012-04-26 LAB — CBC
HCT: 32 % — ABNORMAL LOW (ref 36.0–46.0)
MCV: 92.2 fL (ref 78.0–100.0)
RDW: 13.6 % (ref 11.5–15.5)
WBC: 19.5 10*3/uL — ABNORMAL HIGH (ref 4.0–10.5)

## 2012-04-26 LAB — BASIC METABOLIC PANEL
BUN: 10 mg/dL (ref 6–23)
CO2: 18 mEq/L — ABNORMAL LOW (ref 19–32)
Chloride: 110 mEq/L (ref 96–112)
Creatinine, Ser: 0.6 mg/dL (ref 0.50–1.10)
Glucose, Bld: 82 mg/dL (ref 70–99)

## 2012-04-26 LAB — TROPONIN I: Troponin I: <0.3 ng/mL (ref <0.30)

## 2012-04-26 MED ORDER — DIPHENHYDRAMINE HCL 50 MG/ML IJ SOLN
50.0000 mg | Freq: Once | INTRAMUSCULAR | Status: AC
  Administered 2012-04-26: 50 mg via INTRAVENOUS
  Filled 2012-04-26: qty 1

## 2012-04-26 MED ORDER — HYDROXYZINE HCL 25 MG PO TABS: 62.5000 mg | ORAL_TABLET | Freq: Three times a day (TID) | ORAL | Status: DC | PRN

## 2012-04-26 MED ORDER — TOPIRAMATE 100 MG PO TABS
100.0000 mg | ORAL_TABLET | Freq: Two times a day (BID) | ORAL | Status: DC
  Administered 2012-04-26 – 2012-04-27 (×3): 100 mg via ORAL
  Filled 2012-04-26 (×4): qty 1

## 2012-04-26 MED ORDER — SIMVASTATIN 20 MG PO TABS: 20.0000 mg | ORAL_TABLET | Freq: Every day | ORAL | Status: DC

## 2012-04-26 MED ORDER — ALBUTEROL SULFATE (5 MG/ML) 0.5% IN NEBU: 2.5000 mg | INHALATION_SOLUTION | RESPIRATORY_TRACT | Status: DC | PRN

## 2012-04-26 MED ORDER — ALBUTEROL SULFATE (5 MG/ML) 0.5% IN NEBU
2.5000 mg | INHALATION_SOLUTION | RESPIRATORY_TRACT | Status: AC
  Administered 2012-04-26: 2.5 mg via RESPIRATORY_TRACT
  Filled 2012-04-26: qty 0.5

## 2012-04-26 MED ORDER — ONDANSETRON HCL 4 MG/2ML IJ SOLN: 4.0000 mg | Freq: Three times a day (TID) | INTRAMUSCULAR | Status: DC | PRN

## 2012-04-26 MED ORDER — SODIUM CHLORIDE 0.9 % IV SOLN
INTRAVENOUS | Status: DC
  Administered 2012-04-27: 01:00:00 via INTRAVENOUS

## 2012-04-26 MED ORDER — POLYETHYLENE GLYCOL 3350 17 G PO PACK
17.0000 g | PACK | Freq: Every day | ORAL | Status: DC
  Administered 2012-04-26 – 2012-04-27 (×2): 17 g via ORAL
  Filled 2012-04-26 (×2): qty 1

## 2012-04-26 MED ORDER — SIMVASTATIN 40 MG PO TABS
40.0000 mg | ORAL_TABLET | Freq: Every day | ORAL | Status: DC
  Administered 2012-04-26 – 2012-04-27 (×2): 40 mg via ORAL
  Filled 2012-04-26 (×2): qty 1

## 2012-04-26 MED ORDER — ACETAMINOPHEN 325 MG PO TABS
650.0000 mg | ORAL_TABLET | Freq: Four times a day (QID) | ORAL | Status: DC | PRN
  Administered 2012-04-26 – 2012-04-27 (×3): 650 mg via ORAL
  Filled 2012-04-26 (×4): qty 2

## 2012-04-26 MED ORDER — PANTOPRAZOLE SODIUM 40 MG PO TBEC
40.0000 mg | DELAYED_RELEASE_TABLET | Freq: Every day | ORAL | Status: DC
  Administered 2012-04-26 – 2012-04-27 (×2): 40 mg via ORAL
  Filled 2012-04-26 (×2): qty 1

## 2012-04-26 MED ORDER — SODIUM CHLORIDE 0.9 % IV SOLN: INTRAVENOUS | Status: DC

## 2012-04-26 MED ORDER — PREDNISONE 20 MG PO TABS
40.0000 mg | ORAL_TABLET | Freq: Every day | ORAL | Status: DC
  Administered 2012-04-27: 40 mg via ORAL
  Filled 2012-04-26 (×3): qty 2

## 2012-04-26 MED ORDER — ENOXAPARIN SODIUM 40 MG/0.4ML ~~LOC~~ SOLN
40.0000 mg | SUBCUTANEOUS | Status: DC
  Administered 2012-04-26 – 2012-04-27 (×2): 40 mg via SUBCUTANEOUS
  Filled 2012-04-26 (×2): qty 0.4

## 2012-04-26 MED ORDER — LAMOTRIGINE 150 MG PO TABS
150.0000 mg | ORAL_TABLET | Freq: Two times a day (BID) | ORAL | Status: DC
  Administered 2012-04-26 – 2012-04-27 (×3): 150 mg via ORAL
  Filled 2012-04-26 (×4): qty 1

## 2012-04-26 MED ORDER — PREDNISONE 20 MG PO TABS: 40.0000 mg | ORAL_TABLET | Freq: Every day | ORAL | Status: DC

## 2012-04-26 MED ORDER — LEVOFLOXACIN 750 MG PO TABS: 750.0000 mg | ORAL_TABLET | Freq: Every day | ORAL | Status: DC

## 2012-04-26 MED ORDER — ONDANSETRON HCL 4 MG/2ML IJ SOLN: 4.0000 mg | Freq: Four times a day (QID) | INTRAMUSCULAR | Status: DC | PRN

## 2012-04-26 MED ORDER — DIPHENHYDRAMINE HCL 50 MG/ML IJ SOLN: 50.0000 mg | Freq: Once | INTRAMUSCULAR | Status: DC

## 2012-04-26 MED ORDER — LEVOFLOXACIN 750 MG PO TABS
750.0000 mg | ORAL_TABLET | Freq: Every day | ORAL | Status: DC
  Administered 2012-04-26: 750 mg via ORAL
  Filled 2012-04-26 (×2): qty 1

## 2012-04-26 MED ORDER — ACETAMINOPHEN 650 MG RE SUPP: 650.0000 mg | Freq: Four times a day (QID) | RECTAL | Status: DC | PRN

## 2012-04-26 MED ORDER — FAMOTIDINE IN NACL 20-0.9 MG/50ML-% IV SOLN
20.0000 mg | Freq: Once | INTRAVENOUS | Status: AC
  Administered 2012-04-26: 20 mg via INTRAVENOUS
  Filled 2012-04-26: qty 50

## 2012-04-26 MED ORDER — DOCUSATE SODIUM 100 MG PO CAPS: 100.0000 mg | ORAL_CAPSULE | Freq: Every day | ORAL | Status: DC | PRN

## 2012-04-26 NOTE — H&P (Signed)
Family Medicine Teaching [facility] Admission History and Physical Service Pager: [phone]  Patient name: [name] Medical record number: [id number] Date of birth: 23-Jan-1956 Age: 56 y.o. Gender: female  Primary Care Provider: MATTHEWS,CODY, DO  Chief Complaint: Shortness of breath, fever and cough   History of Present Illness: [name] is a 56 y.o. year old female with a history of COPD, tobacco abuse, seizure disorder and DVT (30 years ago) presenting with shortness of breath, fever (Tmax 101.0) for a week and productive cough at home.  She has been coughing up clear sputum.  Patient says SOB has been worse the last few days.  She is an active smoker, but does not believe she will need a nicotine patch during her stay.  She smokes about 1 ppd, but has not smoked any cigarettes while she has been acutely ill.  At home, she uses Spiriva daily and Albuterol every 6 hours.  She has felt fatigued for the past few days, which has been getting progressively worse. She uses a CPAP at night and is requesting one during her stay here, however she does not know her CPAP settings.  Patient has been eating and drinking well.  No nausea or vomiting associated.  Denies any CP.  She chronically struggles with mild constipation and she has not had a BM in 4 days.  ED course: Patient's CXR was negative for acute cardiopulmonary disease.  CT chest did show multi-focal pneumonia.  Because she complained of productive cough, elevated WBC, and temperature of 101, ED physician started Avelox.  Patient developed itching after receiving one dose, but itching has subsided.  Because patient has multiple co-morbidities in addition to acute pneumonia, ED physician called [facility] to admit for IV antibiotics and observation.  Patient Active Problem List  Diagnosis  . DIABETES MELLITUS II, UNCOMPLICATED  . UNSPECIFIED VITAMIN D DEFICIENCY  . HYPERLIPIDEMIA  . OBESITY, NOS  . ANXIETY  . PANIC ATTACK  . TOBACCO  DEPENDENCE  . SLEEP APNEA, OBSTRUCTIVE, MODERATE  . MIGRAINE, COMMON, INTRACTABLE  . GLAUCOMA  . SINUSITIS, CHRONIC  . ALLERGIC RHINITIS  . COPD  . GASTROESOPHAGEAL REFLUX, NO ESOPHAGITIS  . HERNIA, HIATAL, NONCONGENITAL  . IRRITABLE BOWEL SYNDROME  . INCONTINENCE, STRESS, FEMALE  . MENOPAUSAL SYNDROME  . DJD (degenerative joint disease) of knee  . ROTATOR CUFF INJURY, LEFT SHOULDER  . OSTEOPENIA  . CONVULSIONS, SEIZURES, NOS  . POSTMENOPAUSAL STATUS  . Tinea pedis  . Chest wall pain  . Bronchitis  . Candidal skin infection  . Edema   Past Medical History: Past Medical History  Diagnosis Date  . Allergic rhinitis   . COPD (chronic obstructive pulmonary disease)   . Depression   . Diabetes mellitus     No meds since weight loss  . Hypertension   . Hyperlipidemia   . GERD (gastroesophageal reflux disease)   . Seizures   . OSA on CPAP   . Glaucoma   . DJD (degenerative joint disease)   . Neck pain     Cervical disc  . Migraine   . IBS (irritable bowel syndrome)   . Esophageal stricture   . Diverticulosis   . Arthritis   . H/O blood clots    Past Surgical History: Past Surgical History  Procedure Date  . Abdominal hysterectomy   . Tubal ligation   . Knee arthroscopy     left  . Carpal tunnel release     right  . Shoulder surgery  left   Social History: History  Substance Use Topics  . Smoking status: Current Every Day Smoker -- 1.0 packs/day    Types: Cigarettes  . Smokeless tobacco: Never Used   Comment: does not want to quitt  . Alcohol Use: No   For any additional social history documentation, please refer to relevant sections of EMR.  Family History: Family History  Problem Relation Age of Onset  . Heart disease Maternal Grandfather   . Heart disease      uncle  . Diabetes Maternal Grandfather   . Breast cancer Maternal Aunt   . Alcohol abuse Father   . Bipolar disorder Son   . Muscular dystrophy Son   . Diabetes      aunts  .  Diabetes      uncles  . Heart disease Sister   . Lung cancer Father   . Colon cancer Neg Hx   . Lung cancer Mother   . Other Sister     blood clotting disorder  . Stroke Mother   . Heart disease Son    Allergies: Allergies  Allergen Reactions  . Amoxicillin Anaphylaxis  . Penicillins Shortness Of Breath and Rash    "almost died"  . Azithromycin Swelling  . Clarithromycin Hives  . Fluticasone-Salmeterol     REACTION: ulcers in mouth  . Nortriptyline Other (See Comments)    Per patient had "kidney problems" on this medication  . Doxycycline Hyclate Rash    REACTION: respiratory distress  . Sulfonamide Derivatives Rash   No current facility-administered medications on file prior to encounter.   Current Outpatient Prescriptions on File Prior to Encounter  Medication Sig Dispense Refill  . acetaminophen (TYLENOL) 325 MG tablet Take 975 mg by mouth every 6 (six) hours as needed. For pain      . albuterol (PROVENTIL) (2.5 MG/3ML) 0.083% nebulizer solution Take 2.5 mg by nebulization every 6 (six) hours as needed. For shortness of breath      . Cholecalciferol (VITAMIN D3) 2000 UNITS TABS Take 1 tablet by mouth daily.      Marland Kitchen dicyclomine (BENTYL) 20 MG tablet Take 1 tablet (20 mg total) by mouth 4 (four) times daily -  before meals and at bedtime.  120 tablet  6  . esomeprazole (NEXIUM) 40 MG capsule Take 1 capsule (40 mg total) by mouth 2 (two) times daily.  30 capsule  0  . fluticasone (FLONASE) 50 MCG/ACT nasal spray Place 2 sprays into the nose daily.      . Glucose Blood (BLOOD GLUCOSE TEST STRIPS) STRP Use to test blood glucose daily.  100 each  12  . hydrOXYzine (ATARAX/VISTARIL) 50 MG tablet Take 62.5 mg by mouth every 6 (six) hours as needed. For itching      . lactulose (CHRONULAC) 10 GM/15ML solution take 30-39mL as needed for soft bowel movements.  disp       . lamoTRIgine (LAMICTAL) 150 MG tablet Take 150 mg by mouth 2 (two) times daily.       Marland Kitchen loratadine  (CLARITIN) 10 MG tablet Take 10 mg by mouth daily.        Marland Kitchen omeprazole (PRILOSEC) 40 MG capsule Take 40 mg by mouth 2 (two) times daily.      . pramoxine-hydrocortisone (ANALPRAM HC) cream Apply 1 application topically daily as needed. For itching      . simvastatin (ZOCOR) 40 MG tablet Take 1 tablet (40 mg total) by mouth every evening.  30 tablet  6  .  tiotropium (SPIRIVA) 18 MCG inhalation capsule Place 18 mcg into inhaler and inhale daily.      Marland Kitchen topiramate (TOPAMAX) 100 MG tablet Take 100 mg by mouth 2 (two) times daily.         Review Of Systems: Per HPI  Otherwise 12 point review of systems was performed and was unremarkable.  Physical Exam: BP 115/69  Pulse 85  Temp 97.4 F (36.3 C) (Oral)  Resp 20  Wt 186 lb (84.369 kg)  SpO2 97% Exam: General: Alert. Oriented. NAD. 1.5LNC.  HEENT: AT.[place]. PERLA. EOMi. No discharge from bilateral eyes or nares. Oropharynx moist. Cardiovascular: RRR. S1S2. No murmur. No rubs or gallops.  Respiratory: Moderate Diffuse Wheezing bilaterally. Mild Crackles RLL. No rhonchi. Abdomen: Soft. NT.ND. No HSM. BS+ Extremities: WNL ROM. Pulses +2/4 bilateral UE/LE. Well perfused. Skin: Warm and dry.  No cyanosis, clubbing, or edema. Neuro: No focal deficits. Grossly intact.  Labs and Imaging: CXR: @ [facility]: IMPRESSION: Stable chronic central airway thickening consistent with bronchitis. No acute cardiopulmonary process identified. Original Report Authenticated By: Gerrianne Scale, M.D.  CT pe: CT Pe was ordered at [facility], which shows likely a multifocal pneumonia  Date: 04/26/2012  Rate: 89  Rhythm: normal sinus rhythm  QRS Axis: normal  Intervals: normal  ST/T Wave abnormalities: normal  Conduction Disutrbances:none  Narrative Interpretation:  Old EKG Reviewed: unchanged  CBC BMET   Lab 04/25/12 1912  WBC 18.4*  HGB 11.3*  HCT 34.0*  PLT 232    Lab 04/25/12 1912  NA 137  K 3.2*  CL 105  CO2 18*  BUN 14  CREATININE 0.77    GLUCOSE 176*  CALCIUM 8.8       Assessment and Plan: [name] is a 56 y.o. year old female with a history of COPD, tobacco abuse, seizure disorder and DVT (30 years ago) presenting with shortness of breath, fever (Tmax 101.0) for a week and productive cough at home started.  1. COPD with Pneumonia: - CT: likely a multifocal pneumonia - CXR: Stable chronic central airway thickening consistent with bronchitis. No acute cardiopulmonary process identified.   - Avolox administered at [facility]; consider cheaper antibiotic to transition to home. Patient has many allergies. - Abuterol Q4Q2 prn for SOB - Tylenol PRN - Labs: CBC, BMP in the A.M. - Consider repeat CXR after patient receives IVF - Smoking cessation counseling prior to discharge  2. Chest Pain: patient had one episode of CP in [facility], but denies any pain at this time.  EKG NSR without ST abnormalties.  First set of troponin negative. - Will cycle cardiac enzymes x 2  3. Seizure disorder: - Lamotrigine and topiramate continued  4. OSA on CPAP: Will order CPAP at bedtime.  5 GERD: - Omeprazole 40 mg  6. FEN/GI: -  MIVF NS @ 57mL/h  - Heart Healthy diet - Encourage fluids PO - Colace PRN - Zofran PRN  Prophylaxis: Lovenox 40 Disposition: Pending improvement of leukocytosis, fever, cough and shortness of breath. Code Status: Full Felix Pacini, DO 04/26/2012, 3:48 AM  Patient seen and examined with Dr. Claiborne Billings.  Available data reviewed. Agree with findings, assessment, and plan as outlined by Dr. Claiborne Billings.  My additional findings are highlighted above.  Euclid Cassetta [employer], DO 04/26/2012 7:08 AM

## 2012-04-26 NOTE — ED Notes (Signed)
Pt hands reddened and reports itching all over. Denies throat closing up. Reports SOB, but chronically SOB not exacerbated. MD notified. MD informed me since pt is allergic to most antibiotics, Avelox will have to be given but at a slower pace.

## 2012-04-26 NOTE — Discharge Summary (Signed)
Physician Discharge Summary  Patient ID: LOUAN MCMAIN MRN: [id number] DOB/AGE: 11-19-1955 56 y.o.  Admit date: 04/25/2012 Discharge date: 04/27/2012  Admission Diagnoses: Shortness of breath, fatigue with fever  Discharge Diagnoses:  Principal Problem:  *Pneumonia Active Problems:  SLEEP APNEA, OBSTRUCTIVE, MODERATE  COPD  Bronchitis   Discharged Condition: good  Hospital Course:  [name] is a 56 y.o. year old female with a history of COPD, tobacco abuse, seizure disorder and DVT (30 years ago) presenting with shortness of breath, fever (Tmax 101.0) for a week and productive cough at home.  ED course: Patient's CXR was negative for acute cardiopulmonary disease. CT chest did show multi-focal pneumonia. Because she complained of productive cough, elevated WBC, and temperature of 101, ED physician started Avelox. Patient developed itching after receiving one dose, but itching subsided with Benadryl. Because patient has multiple co-morbidities in addition to acute pneumonia, ED physician called [facility] to admit for IV antibiotics and observation. Avalox was transitioned to Levaquin PO for a total of 5 day course, and prednisone was administered for a 5 day course.    Consults: None  Significant Diagnostic Studies:  04/25/2012 CT IMPRESSION:  1. Negative for pulmonary embolus.  2. Patchy airspace disease most notable in the right lung with  tree in bud opacity as described. Findings are most compatible  with multifocal pneumonia, possibly atypical.    Treatments: IV hydration, antibiotics: Levaquin and Avolox, anticoagulation: LMW heparin and respiratory therapy: O2 and albuterol/atropine nebulizer  Discharge Exam: Blood pressure 120/63, pulse 106, temperature 98 F (36.7 C), temperature source Oral, resp. rate 18, weight 186 lb (84.369 kg), SpO2 97.00%. Physical Exam:  Gen: NAD. Headache. Hand on front of head.  CV: RRR. No murmurs.  Res: Slight wheezing bilaterally. No rales or  rhonchi.  Abd: Soft. Non tender. Non distended.  Ext/Musc: No edema, erythema or tenderness.  Neuro:Oriented in. No focal motor or sensory deficits   Disposition: 01-Home or Self Care      Discharge Orders    Future Appointments: Provider: Department: Dept Phone: Center:   05/05/2012 2:45 PM Everrett Coombe, DO [facility] [phone] [facility]     Future Orders Please Complete By Expires   Diet - low sodium heart healthy      Increase activity slowly          Medication List     As of 04/27/2012 11:52 AM    STOP taking these medications         esomeprazole 40 MG capsule   Commonly known as: NEXIUM      TAKE these medications         acetaminophen 325 MG tablet   Commonly known as: TYLENOL   Take 975 mg by mouth every 6 (six) hours as needed. For pain      albuterol (2.5 MG/3ML) 0.083% nebulizer solution   Commonly known as: PROVENTIL   Take 2.5 mg by nebulization every 6 (six) hours as needed. For shortness of breath      PROAIR HFA 108 (90 BASE) MCG/ACT inhaler   Generic drug: albuterol   Inhale 2 puffs into the lungs every 6 (six) hours as needed. Shortness of breath      BLOOD GLUCOSE TEST STRIPS Strp   Use to test blood glucose daily.      dicyclomine 20 MG tablet   Commonly known as: BENTYL   Take 1 tablet (20 mg total) by mouth 4 (four) times daily -  before meals and at  bedtime.      fluticasone 50 MCG/ACT nasal spray   Commonly known as: FLONASE   Place 2 sprays into the nose daily.      hydrOXYzine 50 MG tablet   Commonly known as: ATARAX/VISTARIL   Take 62.5 mg by mouth every 6 (six) hours as needed. For itching      lactulose 10 GM/15ML solution   Commonly known as: CHRONULAC   take 30-69mL as needed for soft bowel movements.  disp      lamoTRIgine 150 MG tablet   Commonly known as: LAMICTAL   Take 150 mg by mouth 2 (two) times daily.      levofloxacin 750 MG tablet   Commonly known as: LEVAQUIN   Take 1 tablet (750 mg total)  by mouth at bedtime.      loratadine 10 MG tablet   Commonly known as: CLARITIN   Take 10 mg by mouth daily.      meloxicam 7.5 MG tablet   Commonly known as: MOBIC   Take 7.5 mg by mouth daily.      omeprazole 40 MG capsule   Commonly known as: PRILOSEC   Take 40 mg by mouth 2 (two) times daily.      pramoxine-hydrocortisone cream   Commonly known as: ANALPRAM HC   Apply 1 application topically daily as needed. For itching      predniSONE 20 MG tablet   Commonly known as: DELTASONE   Take 2 tablets (40 mg total) by mouth daily with breakfast.      simvastatin 40 MG tablet   Commonly known as: ZOCOR   Take 1 tablet (40 mg total) by mouth every evening.      tiotropium 18 MCG inhalation capsule   Commonly known as: SPIRIVA   Place 18 mcg into inhaler and inhale daily.      topiramate 100 MG tablet   Commonly known as: TOPAMAX   Take 100 mg by mouth 2 (two) times daily.      Vitamin D3 2000 UNITS Tabs   Take 1 tablet by mouth daily.         Follow-up Information    Follow up with MATTHEWS,CODY, DO. On 05/05/2012. (@2 :45)    Contact information:   [address] [place] [address] [phone]          Signed: Felix Pacini 04/27/2012, 11:52 AM

## 2012-04-26 NOTE — ED Notes (Signed)
EKG printed and given to EDP Nanavati for review. Last confirmed EKG printed for comparison 

## 2012-04-26 NOTE — ED Notes (Signed)
No complaints about antibiotic from pt.

## 2012-04-26 NOTE — H&P (Signed)
Seen and examined.  Discussed with Dr. Tye Savoy.  Agree with admit, management and documentation.  Briefly 56 yo female with COPD presents with worsening of SOB.  Found to have pneumonia on CT scan (not on CXR).  Agree with resp quinolone due to CAP in high risk, COPD patient.   I am pleased that she is in no resp distress and currently has no O2 requirement.  This may be a brief hospitalization.

## 2012-04-26 NOTE — ED Notes (Signed)
Avelox given at 25 ml/hr.

## 2012-04-26 NOTE — Progress Notes (Signed)
Continue to monitor BP closely. - may need to bolus with fluid if they continue to run low.

## 2012-04-27 ENCOUNTER — Encounter (HOSPITAL_COMMUNITY): Payer: Self-pay | Admitting: General Practice

## 2012-04-27 LAB — CBC
HCT: 32.2 % — ABNORMAL LOW (ref 36.0–46.0)
Hemoglobin: 10.8 g/dL — ABNORMAL LOW (ref 12.0–15.0)
MCHC: 33.5 g/dL (ref 30.0–36.0)

## 2012-04-27 MED ORDER — KETOROLAC TROMETHAMINE 30 MG/ML IJ SOLN: 30.0000 mg | Freq: Once | INTRAMUSCULAR | Status: DC

## 2012-04-27 MED ORDER — INFLUENZA VIRUS VACC SPLIT PF IM SUSP
0.5000 mL | Freq: Once | INTRAMUSCULAR | Status: DC
  Filled 2012-04-27: qty 0.5

## 2012-04-27 MED ORDER — PREDNISONE 20 MG PO TABS: 40.0000 mg | ORAL_TABLET | Freq: Every day | ORAL | Status: DC

## 2012-04-27 MED ORDER — ALBUTEROL SULFATE (5 MG/ML) 0.5% IN NEBU
2.5000 mg | INHALATION_SOLUTION | Freq: Once | RESPIRATORY_TRACT | Status: AC
  Administered 2012-04-27: 2.5 mg via RESPIRATORY_TRACT
  Filled 2012-04-27: qty 0.5

## 2012-04-27 MED ORDER — KETOROLAC TROMETHAMINE 30 MG/ML IJ SOLN
30.0000 mg | Freq: Once | INTRAMUSCULAR | Status: AC
  Administered 2012-04-27: 30 mg via INTRAMUSCULAR
  Filled 2012-04-27: qty 1

## 2012-04-27 MED ORDER — LEVOFLOXACIN 750 MG PO TABS: 750.0000 mg | ORAL_TABLET | Freq: Every day | ORAL | Status: DC

## 2012-04-27 NOTE — Progress Notes (Signed)
Family Medicine Teaching Service                                                                 [facility] Progress Note     Patient name: [name] Medical record number: [id number] Date of birth: 1956-02-09 Age: 56 y.o. Gender: female    LOS: 2 days   Primary Care Provider: MATTHEWS,CODY, DO  Overnight Events: Patient did not sleep well overnight. She complains of a headache that started yesterday afternoon and is getting worse. This is the pattern of her normal headaches in which she states she gets 3 shots for in the ED. She asking for something to relieve the pain. Otherwise she ready to go home. Eating and drinking well. No O2 requirement with ambulation.    Objective: Vital signs in last 24 hours: BP 120/63  Pulse 106  Temp 98 F (36.7 C) (Oral)  Resp 18  Wt 186 lb (84.369 kg)  SpO2 97%    Physical Exam: Gen: NAD. Headache. Hand on front of head. CV: RRR. No murmurs. Res: Slight wheezing bilaterally. No rales or rhonchi. Abd: Soft. Non tender. Non distended. Ext/Musc: No edema, erythema or tenderness. Neuro:Oriented in. No focal motor or sensory deficits.  Labs/Studies: CBC pending Medications: Scheduled Meds:   . albuterol  2.5 mg Nebulization Q4H  . enoxaparin (LOVENOX) injection  40 mg Subcutaneous Q24H  . lamoTRIgine  150 mg Oral BID  . levofloxacin  750 mg Oral QHS  . pantoprazole  40 mg Oral Q1200  . polyethylene glycol  17 g Oral Daily  . predniSONE  40 mg Oral Q breakfast  . simvastatin  40 mg Oral q1800  . topiramate  100 mg Oral BID  . DISCONTD: moxifloxacin  400 mg Intravenous Q24H   Continuous Infusions:   . DISCONTD: sodium chloride 60 mL/hr at 04/27/12 0034   PRN Meds:.acetaminophen, acetaminophen, albuterol, hydrOXYzine, ondansetron (ZOFRAN) IV, DISCONTD: docusate sodium   Assessment/Plan: 1. COPD with Pneumonia:  - CT: likely a multifocal pneumonia  - CXR: Stable chronic central airway thickening consistent with bronchitis. No  acute cardiopulmonary process identified.  - Avolox administered at [facility]; consider cheaper antibiotic to transition to home. Patient has many allergies.  - Levaquin PO; Day of ABX = 2, next dose (3) due at 2200 today: total course 5 days - prednisone 40 mg started today for 5 day course - Abuterol Q4Q2 prn for SOB; placed order for one now.  - Tylenol PRN  - Labs: WBC: 18.4--> 19.5 yesterday; CBC pending this morning. - Consider repeat CXR after patient receives IVF  - Smoking cessation counseling prior to discharge; ordered placed today - No oxygen requirement over night or with ambulation today  2. Chest Pain: patient had one episode of CP in [facility], but denies any pain at this time. EKG NSR without ST abnormalties. First set of troponin negative.  - Will cycle cardiac enzymes x 2   3. Seizure disorder:  - Lamotrigine and topiramate continued   4. OSA on CPAP: Will order CPAP at bedtime.   5 GERD:  - Omeprazole 40 mg   6. Headache: - toradol 30 mg   7. FEN/GI:  - Heart Healthy diet  - Encourage fluids PO  - Colace PRN  -  Zofran PRN  Prophylaxis: Lovenox 40  Disposition: Probable discharge this afternoon. After smoking cessation counseling.  Code Status: Full  Felix Pacini, DO  04/26/2012, 3:48 AM

## 2012-04-27 NOTE — Progress Notes (Signed)
I discussed with Dr Kuneff.  I agree with their plans documented in their progress note for today.  

## 2012-04-27 NOTE — Progress Notes (Signed)
Utilization review completed. Arianna Delsanto, RN, BSN. 

## 2012-04-27 NOTE — Progress Notes (Signed)
Patient declines tobacco cessation counseling per order.

## 2012-04-28 ENCOUNTER — Telehealth: Payer: Self-pay | Admitting: *Deleted

## 2012-04-28 NOTE — Discharge Summary (Signed)
I discussed with Dr Claiborne Billings.  I agree with their plans documented in their discharge note.

## 2012-04-28 NOTE — Telephone Encounter (Signed)
Patient was discharged from hospital and Levaquin and prednisone were supposed to be sent to [facility] pharmacy.  Patient states pharmacy has not received meds and she will need meds sent to [facility] instead.  Per Epic---meds were e-rx'd yesterday by Dr. Claiborne Billings.  Called [facility] on [address] and rx'd Levaquin and prednisone per dosage in Epic.  Patient informed.  Gaylene Brooks, RN

## 2012-04-30 ENCOUNTER — Other Ambulatory Visit: Payer: Self-pay | Admitting: Family Medicine

## 2012-05-05 ENCOUNTER — Ambulatory Visit (INDEPENDENT_AMBULATORY_CARE_PROVIDER_SITE_OTHER): Payer: Medicaid Other | Admitting: Family Medicine

## 2012-05-05 ENCOUNTER — Encounter: Payer: Self-pay | Admitting: Family Medicine

## 2012-05-05 VITALS — BP 121/79 | HR 76 | Temp 98.2°F | Ht 61.0 in | Wt 181.0 lb

## 2012-05-05 DIAGNOSIS — Z23 Encounter for immunization: Secondary | ICD-10-CM

## 2012-05-05 DIAGNOSIS — J189 Pneumonia, unspecified organism: Secondary | ICD-10-CM

## 2012-05-05 DIAGNOSIS — Z09 Encounter for follow-up examination after completed treatment for conditions other than malignant neoplasm: Secondary | ICD-10-CM

## 2012-05-05 DIAGNOSIS — R51 Headache: Secondary | ICD-10-CM

## 2012-05-05 MED ORDER — KETOROLAC TROMETHAMINE 30 MG/ML IJ SOLN
30.0000 mg | Freq: Once | INTRAMUSCULAR | Status: AC
  Administered 2012-05-05: 30 mg via INTRAMUSCULAR

## 2012-05-05 NOTE — Patient Instructions (Signed)
Thank you for coming in today, it was good to see you Great job with quitting smoking!! Glad to see you are feeling better.

## 2012-05-09 DIAGNOSIS — R519 Headache, unspecified: Secondary | ICD-10-CM | POA: Insufficient documentation

## 2012-05-09 DIAGNOSIS — Z09 Encounter for follow-up examination after completed treatment for conditions other than malignant neoplasm: Secondary | ICD-10-CM | POA: Insufficient documentation

## 2012-05-09 NOTE — Assessment & Plan Note (Signed)
Hospital

## 2012-05-09 NOTE — Progress Notes (Signed)
  Subjective:    Patient ID: [name], female    DOB: Feb 06, 1956, 56 y.o.   MRN: [id number]  HPI 1.  HFU:  Hospitalized for pneumonia and discharged on 9/15.  States this is a common occurrence in the fall for her. She has had pneumovax before.  She has completed antibiotics and steroid course.  She has not smoked since being in hospital.  She feels much better since discharge.  She continues to use her spiriva and albuterol daily.  Does not need albuterol that often and has not noticed wheezing.  She denies fever, chills, shortness of breath, chest pain.    2.  Headache:  Suffers from migraines and chronic daily headache.  She is followed by neurology and currently on topamax as a preventative medication. States she has had increased stress lately with things that her husband is going through.  Describes headaches as all over her head, like "tightness".  Toradol often works well for her headaches.  Does endorse light sensitivity. Denies any weakness, vision changes, nausea.   Review of Systems Per HPI    Objective:   Physical Exam  Constitutional: She appears well-nourished. No distress.  HENT:  Head: Normocephalic and atraumatic.  Mouth/Throat: Oropharynx is clear and moist.  Eyes: EOM are normal. Pupils are equal, round, and reactive to light.  Neck: Neck supple.  Cardiovascular: Normal rate and regular rhythm.   Pulmonary/Chest: Effort normal and breath sounds normal. She has no wheezes.  Musculoskeletal: She exhibits no edema.  Neurological: She is alert. No cranial nerve deficit.          Assessment & Plan:

## 2012-05-09 NOTE — Assessment & Plan Note (Signed)
Not convinced this is a migraine today, sounds more like stress/tension.  No focal deficits on exam.  Given 30mg  IM toradol today.

## 2012-05-09 NOTE — Assessment & Plan Note (Signed)
Doing well, difficulty breathing resolved.  Completed medications.  Will follow up as needed for this.

## 2012-05-10 ENCOUNTER — Other Ambulatory Visit: Payer: Self-pay | Admitting: Family Medicine

## 2012-05-10 NOTE — Telephone Encounter (Signed)
Patient requested a refill on her seizure medication Lamictal to go to [facility], but they did not get it.

## 2012-05-10 NOTE — Telephone Encounter (Signed)
Forward to PCP for refill request 

## 2012-05-11 MED ORDER — LAMOTRIGINE 150 MG PO TABS: 150.0000 mg | ORAL_TABLET | Freq: Two times a day (BID) | ORAL | Status: DC

## 2012-05-11 NOTE — Telephone Encounter (Signed)
Rx sent in but would prefer she have her neurologist fill this for further refills.

## 2012-05-11 NOTE — Telephone Encounter (Signed)
Pt has 1 more pill left and needs asap

## 2012-05-11 NOTE — Telephone Encounter (Signed)
Dr. Ashley Royalty in clinic today.  Will route refill request to him & call patient back.  Gaylene Brooks, RN

## 2012-05-21 ENCOUNTER — Ambulatory Visit (INDEPENDENT_AMBULATORY_CARE_PROVIDER_SITE_OTHER): Payer: Medicaid Other | Admitting: Family Medicine

## 2012-05-21 ENCOUNTER — Encounter: Payer: Self-pay | Admitting: Family Medicine

## 2012-05-21 VITALS — BP 109/62 | HR 85 | Temp 98.6°F | Ht 61.0 in | Wt 182.0 lb

## 2012-05-21 DIAGNOSIS — J449 Chronic obstructive pulmonary disease, unspecified: Secondary | ICD-10-CM

## 2012-05-21 DIAGNOSIS — R51 Headache: Secondary | ICD-10-CM

## 2012-05-21 MED ORDER — KETOROLAC TROMETHAMINE 60 MG/2ML IM SOLN: 60.0000 mg | Freq: Once | INTRAMUSCULAR | Status: DC

## 2012-05-21 MED ORDER — LEVOFLOXACIN 500 MG PO TABS: 500.0000 mg | ORAL_TABLET | Freq: Every day | ORAL | Status: DC

## 2012-05-21 MED ORDER — PREDNISONE 50 MG PO TABS: ORAL_TABLET | ORAL | Status: DC

## 2012-05-21 NOTE — Patient Instructions (Signed)
Continue using your inhalers as needed.  Take the Prednisone 1 pill a day for the next 7 days.  Take the Levaquin 1 pill a day for the next 7 days.    Come back and see [place] next week so we can make sure you're getting better.

## 2012-05-21 NOTE — Progress Notes (Signed)
Patient ID: [name], female   DOB: March 11, 1956, 56 y.o.   MRN: [id number] [name] is a 56 y.o. female who presents to [facility] with recent hospitalization for PNA who presents today for concerns for bronchitis:  1.  Cough:  Increasing cough, shortness of breath, and wheezing which started on Tuesday.  Using her inhalers 4 times a day and nebulizer treatments 2 x day.  Has had some relief of breathing but not much with coughing.  Worse at end of day.  Cough productive of thick green sputum.  No fevers or chills.    2.  Headaches:  Have restarted since worsening of her cough.  Describes light sensititvity, bilateral frontal pain.  Asking for "pain shot" today.  Toradol seems to have worked for her in past.  No dizziness or recent trauma.    The following portions of the patient's history were reviewed and updated as appropriate: allergies, current medications, past medical history, family and social history, and problem list.  Patient is a nonsmoker.  Past Medical History  Diagnosis Date  . Allergic rhinitis   . Hypertension   . Hyperlipidemia   . GERD (gastroesophageal reflux disease)   . Glaucoma(365)   . Neck pain     Cervical disc  . IBS (irritable bowel syndrome)   . Esophageal stricture   . Diverticulosis   . Right leg DVT 1982    groin  . COPD (chronic obstructive pulmonary disease)   . Emphysema   . Pneumonia 2012; 04/27/2012  . Chronic bronchitis     "keep it all the time" (04/27/2012)  . Exertional dyspnea   . OSA on CPAP   . Type II diabetes mellitus     No meds since weight loss  . Migraine   . Epilepsy   . DJD (degenerative joint disease)   . Arthritis     "hands and legs and feet" (04/27/2012)  . Depression     ROS as above otherwise neg. No Chest pain, palpitations, SOB, Fever, Chills, Abd pain, N/V/D.  Medications reviewed. Current Outpatient Prescriptions  Medication Sig Dispense Refill  . acetaminophen (TYLENOL) 325 MG tablet Take 975 mg by mouth every 6  (six) hours as needed. For pain      . albuterol (PROAIR HFA) 108 (90 BASE) MCG/ACT inhaler Inhale 2 puffs into the lungs every 6 (six) hours as needed. Shortness of breath      . albuterol (PROVENTIL) (2.5 MG/3ML) 0.083% nebulizer solution Take 2.5 mg by nebulization every 6 (six) hours as needed. For shortness of breath      . Cholecalciferol (VITAMIN D3) 2000 UNITS TABS Take 1 tablet by mouth daily.      Marland Kitchen dicyclomine (BENTYL) 20 MG tablet Take 1 tablet (20 mg total) by mouth 4 (four) times daily -  before meals and at bedtime.  120 tablet  6  . fluticasone (FLONASE) 50 MCG/ACT nasal spray Place 2 sprays into the nose daily.      . Glucose Blood (BLOOD GLUCOSE TEST STRIPS) STRP Use to test blood glucose daily.  100 each  12  . hydrOXYzine (ATARAX/VISTARIL) 50 MG tablet Take 62.5 mg by mouth every 6 (six) hours as needed. For itching      . lactulose (CHRONULAC) 10 GM/15ML solution take 30-76mL as needed for soft bowel movements.  disp       . lamoTRIgine (LAMICTAL) 150 MG tablet Take 1 tablet (150 mg total) by mouth 2 (two) times daily.  60  tablet  1  . levofloxacin (LEVAQUIN) 750 MG tablet Take 1 tablet (750 mg total) by mouth at bedtime.  3 tablet  0  . loratadine (CLARITIN) 10 MG tablet Take 10 mg by mouth daily.        . meloxicam (MOBIC) 7.5 MG tablet Take 7.5 mg by mouth daily.      Marland Kitchen omeprazole (PRILOSEC) 40 MG capsule Take 40 mg by mouth 2 (two) times daily.      . pramoxine-hydrocortisone (ANALPRAM HC) cream Apply 1 application topically daily as needed. For itching      . predniSONE (DELTASONE) 20 MG tablet Take 2 tablets (40 mg total) by mouth daily with breakfast.  6 tablet  0  . simvastatin (ZOCOR) 40 MG tablet Take 1 tablet (40 mg total) by mouth every evening.  30 tablet  6  . SPIRIVA HANDIHALER 18 MCG inhalation capsule Place 1 capsule into inhaler and inhale daily  30 capsule  2  . topiramate (TOPAMAX) 100 MG tablet Take 100 mg by mouth 2 (two) times daily.          Current Facility-Administered Medications  Medication Dose Route Frequency Provider Last Rate Last Dose  . diphenhydrAMINE (BENADRYL) injection 50 mg  50 mg Intramuscular Once Everrett Coombe, DO        Exam:  BP 109/62  Pulse 85  Temp 98.6 F (37 C) (Oral)  Ht 5\' 1"  (1.549 m)  Wt 182 lb (82.555 kg)  BMI 34.39 kg/m2  SpO2 95% Gen: Well NAD HEENT:  [place]/AT.  EOMI, PERRL.  MMM, tonsils non-erythematous, non-edematous.  External ears WNL, Bilateral TM's normal without retraction, redness or bulging.  Lungs: Wheezing bilateral bases with prolonged expiratory phase.   Heart: RRR no MRG Exts: Non edematous BL  LE, warm and well perfused.   No results found for this or any previous visit (from the past 72 hour(s)).

## 2012-05-21 NOTE — Assessment & Plan Note (Addendum)
Acute exacerbation.  Patient exam much improved but still with continued wheezing after neb treatment.  Better air movement.   Is NOT taking Prednisone daily, this is on her medication list as a daily medication.  Plan to treat as acute exacerbation:  Corticosteroids, antibiotics, continued inhaler usage.  She is taking Spiriva now.   FU with [place] next week to ensure improvement.

## 2012-05-22 ENCOUNTER — Telehealth: Payer: Self-pay | Admitting: Family Medicine

## 2012-05-22 DIAGNOSIS — J449 Chronic obstructive pulmonary disease, unspecified: Secondary | ICD-10-CM

## 2012-05-22 MED ORDER — LEVOFLOXACIN 500 MG PO TABS: 500.0000 mg | ORAL_TABLET | Freq: Every day | ORAL | Status: DC

## 2012-05-22 MED ORDER — PREDNISONE 50 MG PO TABS: ORAL_TABLET | ORAL | Status: DC

## 2012-05-22 NOTE — Telephone Encounter (Signed)
[facility] Emergency Line  Seen yesterday for COPD exacerbation. States [facility] did not receive rx reportedly. [facility] as [facility] will not deliver on weekend anyway. Resent both levofloxacin and prednisone 7 day courses.

## 2012-05-24 NOTE — Assessment & Plan Note (Signed)
Toradol for relief today. Likely exacerbated by lack of sleep secondary to COPD exacerbation.

## 2012-06-15 ENCOUNTER — Other Ambulatory Visit: Payer: Self-pay | Admitting: Family Medicine

## 2012-06-17 ENCOUNTER — Other Ambulatory Visit: Payer: Self-pay | Admitting: Family Medicine

## 2012-06-17 MED ORDER — SIMVASTATIN 40 MG PO TABS: 40.0000 mg | ORAL_TABLET | Freq: Every day | ORAL | Status: DC

## 2012-06-17 MED ORDER — LAMOTRIGINE 150 MG PO TABS: 150.0000 mg | ORAL_TABLET | Freq: Two times a day (BID) | ORAL | Status: DC

## 2012-06-17 NOTE — Telephone Encounter (Signed)
Refilled simvastatin and lamictal via fax request from [facility].

## 2012-06-18 ENCOUNTER — Ambulatory Visit (INDEPENDENT_AMBULATORY_CARE_PROVIDER_SITE_OTHER): Payer: Medicaid Other | Admitting: Internal Medicine

## 2012-06-18 ENCOUNTER — Encounter: Payer: Self-pay | Admitting: Internal Medicine

## 2012-06-18 VITALS — BP 122/78 | HR 87 | Temp 97.7°F | Ht 61.0 in | Wt 183.6 lb

## 2012-06-18 DIAGNOSIS — J4489 Other specified chronic obstructive pulmonary disease: Secondary | ICD-10-CM

## 2012-06-18 DIAGNOSIS — F17200 Nicotine dependence, unspecified, uncomplicated: Secondary | ICD-10-CM

## 2012-06-18 DIAGNOSIS — J449 Chronic obstructive pulmonary disease, unspecified: Secondary | ICD-10-CM

## 2012-06-18 NOTE — Progress Notes (Signed)
  Subjective:    Patient ID: [name], female    DOB: [birth year] MRN: [id number]  HPI  15 yowf active smoker with dx of copd aware of sob around 2000 [facility] with exacerbation every few months just disharged from [facility] in 04/2012 for ? CAP with aecopd referred by Dr Wyline Mood for preop for R TKR Jan 13 14   06/18/2012 Pulmonary preop cc doe x one aisle and since last flare using inhaler saba maybe twice weekly and sleeping ok on cpap maintained on spiriva and can't take advair due to mouth irritation.  Min am cough/ congestion with thick white mucus.  No obvious daytime variabilty to cough or sob or cp or chest tightness, subjective wheeze overt sinus or hb symptoms. No unusual exp hx or h/o childhood pna/ asthma or premature birth to her knowledge.   Sleeping ok without nocturnal  or early am exacerbation  of respiratory  c/o's or need for noct saba. Also denies any obvious fluctuation of symptoms with weather or environmental changes or other aggravating or alleviating factors except as outlined above   Review of Systems  Constitutional: Negative for fever, chills and unexpected weight change.  HENT: Positive for congestion. Negative for ear pain, nosebleeds, sore throat, rhinorrhea, sneezing, trouble swallowing, dental problem, voice change, postnasal drip and sinus pressure.   Eyes: Negative for visual disturbance.  Respiratory: Positive for cough and shortness of breath. Negative for choking.   Cardiovascular: Negative for chest pain and leg swelling.  Gastrointestinal: Negative for vomiting, abdominal pain and diarrhea.  Genitourinary: Negative for difficulty urinating.  Musculoskeletal: Negative for arthralgias.  Skin: Negative for rash.  Neurological: Positive for headaches. Negative for tremors and syncope.  Hematological: Does not bruise/bleed easily.       Objective:   Physical Exam  amb obese wf nad Wt Readings from Last 3 Encounters:  06/18/12 183 lb 9.6 oz (83.28  kg)  05/21/12 182 lb (82.555 kg)  05/05/12 181 lb (82.101 kg)    HEENT mild turbinate edema.  Oropharynx no thrush or excess pnd or cobblestoning.  No JVD or cervical adenopathy. Mild accessory muscle hypertrophy. Trachea midline, nl thryroid. Chest was hyperinflated by percussion with diminished breath sounds and moderate increased exp time without wheeze. Hoover sign positive at mid inspiration. Regular rate and rhythm without murmur gallop or rub or increase P2 or edema.  Abd: no hsm, nl excursion. Ext warm without cyanosis or clubbing.    CT 04/25/12 1. Negative for pulmonary embolus.  2. Patchy airspace disease most notable in the right lung with  tree in bud opacity as described. Findings are most compatible  with multifocal pneumonia, possibly atypical.       Assessment & Plan:

## 2012-06-18 NOTE — Patient Instructions (Addendum)
Try to minimize smoking Try tudorza one puff twice daily instead of spiriva on trial basis for 2 weeks - call if you like it better and we'll send you a prescription Please schedule a follow up office visit in 6 weeks, call sooner if needed pft's

## 2012-06-19 NOTE — Assessment & Plan Note (Addendum)
Clinically moderate, symptomatic, and continues to smoke (discussed separately) with mild chronic bronchitic features as well.  Discussed in detail all the  indications, usual  risks and alternatives  relative to the benefits with patient who agrees to try to stop smoking preop if really wants to go through with the surgery (with risks of pulmonary complications much higher if still smoking) but will need to return here in 6 weeks for pft's first.

## 2012-06-19 NOTE — Assessment & Plan Note (Signed)

## 2012-06-24 ENCOUNTER — Encounter: Payer: Self-pay | Admitting: Family Medicine

## 2012-06-24 ENCOUNTER — Ambulatory Visit (HOSPITAL_COMMUNITY)
Admission: RE | Admit: 2012-06-24 | Discharge: 2012-06-24 | Disposition: A | Discharge status: Home / Self Care | Payer: Medicaid Other | Source: Ambulatory Visit | Attending: Family Medicine | Admitting: Family Medicine

## 2012-06-24 ENCOUNTER — Ambulatory Visit (INDEPENDENT_AMBULATORY_CARE_PROVIDER_SITE_OTHER): Payer: Medicaid Other | Admitting: Family Medicine

## 2012-06-24 VITALS — BP 127/82 | HR 86 | Ht 61.0 in | Wt 182.9 lb

## 2012-06-24 DIAGNOSIS — F17200 Nicotine dependence, unspecified, uncomplicated: Secondary | ICD-10-CM

## 2012-06-24 DIAGNOSIS — M25519 Pain in unspecified shoulder: Secondary | ICD-10-CM

## 2012-06-24 DIAGNOSIS — R51 Headache: Secondary | ICD-10-CM

## 2012-06-24 DIAGNOSIS — M25561 Pain in right knee: Secondary | ICD-10-CM

## 2012-06-24 DIAGNOSIS — Z01818 Encounter for other preprocedural examination: Secondary | ICD-10-CM

## 2012-06-24 DIAGNOSIS — M25512 Pain in left shoulder: Secondary | ICD-10-CM

## 2012-06-24 DIAGNOSIS — M25569 Pain in unspecified knee: Secondary | ICD-10-CM

## 2012-06-24 DIAGNOSIS — Z01810 Encounter for preprocedural cardiovascular examination: Secondary | ICD-10-CM | POA: Insufficient documentation

## 2012-06-24 MED ORDER — KETOROLAC TROMETHAMINE 30 MG/ML IJ SOLN
30.0000 mg | Freq: Once | INTRAMUSCULAR | Status: AC
  Administered 2012-06-24: 30 mg via INTRAMUSCULAR

## 2012-06-24 MED ORDER — VARENICLINE TARTRATE 1 MG PO TABS: 1.0000 mg | ORAL_TABLET | Freq: Two times a day (BID) | ORAL | Status: DC

## 2012-06-24 MED ORDER — VARENICLINE TARTRATE 0.5 MG PO TABS: ORAL_TABLET | ORAL | Status: DC

## 2012-06-24 MED ORDER — BLOOD GLUCOSE METER KIT: PACK | Status: DC

## 2012-06-24 NOTE — Progress Notes (Signed)
Subjective:     Patient ID: [name], female   DOB: 06/13/1956, 56 y.o.   MRN: [id number]  HPI 1. Pre-op:  [name] is a 55 yo female with a past med hx COPD, HTN,  DM Type 2 and OSA presenting for a pre-op visit for a right knee replacement scheduled for 08/23/12. She is also being seen by pulmonology. She would like to stop smoking, although she says that it is difficult being around other people that smoke. She has smoked since she was 56 years old.   She denies any CP/shortness of breath. Admits to a cough that she has chronically. She currently sleeps with a CPAP machine but is not currently on oxygen at home.    2. Arm pain:  Had left arm pain she saw Dr. Thurston Hole for. Says she got a steroid shot in both her left shoulder and her right knee. The left arm pain has resolved since a couple of days after the shot but she still complains of right knee pain.  3. Migraine: She also complains of a migraine that she has had for the past 3 days and would like another toradol shot because that is the only thing that tends to help her headaches when she gets them. The headache pain is diffuse.  Denies any neurological deficits.  Admits to photophobia.   Review of Systems  As noted above in HPI.    Objective:   Physical Exam BP 127/82  Pulse 86  Ht 5\' 1"  (1.549 m)  Wt 182 lb 14.4 oz (82.963 kg)  BMI 34.56 kg/m2 General appearance: alert, cooperative and no distress Neck: no adenopathy, no carotid bruit, no JVD, supple, symmetrical, trachea midline and thyroid not enlarged, symmetric, no tenderness/mass/nodules Lungs: clear to auscultation bilaterally Heart: regular rate and rhythm, S1, S2 normal, no murmur, click, rub or gallop Abdomen: soft, non-tender; bowel sounds normal; no masses,  no organomegaly Extremities: extremities normal, atraumatic, no cyanosis or edema Pulses: 2+ and symmetric    Assessment:      Plan:

## 2012-06-24 NOTE — Patient Instructions (Addendum)
Thank you for coming in today, it was good to see you Start chantix as directed. Follow up with pharmacy clinic to discuss smoking cessation I will send a note over to Dr. Thurston Hole.

## 2012-06-27 DIAGNOSIS — Z01818 Encounter for other preprocedural examination: Secondary | ICD-10-CM | POA: Insufficient documentation

## 2012-06-27 DIAGNOSIS — M25512 Pain in left shoulder: Secondary | ICD-10-CM | POA: Insufficient documentation

## 2012-06-27 NOTE — Assessment & Plan Note (Signed)
Toradol given today for migraine.

## 2012-06-27 NOTE — Assessment & Plan Note (Signed)
Resolved with steroid injection by Dr. Thurston Hole.

## 2012-06-27 NOTE — Assessment & Plan Note (Signed)
Current risk factors would give her intermediate cardiac risk for non-vascular/non-cardiac surgery.  Her largest risk factors are her diabetes and COPD.  Her diabetes has been well controlled.  Resting EKG shows no signs of ischemia.  She has been instructed to stop smoking prior to surgery.  She is being followed by pulmonology as well who plans to perform PFT's in the next few weeks.  She is interested in using chantix to discontinue smoking.  I will send a letter to Dr. Thurston Hole

## 2012-06-27 NOTE — Assessment & Plan Note (Signed)
Instructed to stop smoking prior to surgery.  She is unsure if she can stop cold [place].  Interested in trying chantix, reviewed black box warnings associated with chantix and she agreed to go ahead and start.  Will follow up with our pharmacy clinic as well to discuss continued strategies to discontinue smoking.

## 2012-07-05 ENCOUNTER — Telehealth: Payer: Self-pay | Admitting: Family Medicine

## 2012-07-05 NOTE — Telephone Encounter (Signed)
Called on behalf of husband-see note for [facility]

## 2012-07-06 ENCOUNTER — Other Ambulatory Visit: Payer: Self-pay | Admitting: Family Medicine

## 2012-07-06 ENCOUNTER — Telehealth: Payer: Self-pay | Admitting: Family Medicine

## 2012-07-06 NOTE — Telephone Encounter (Signed)
Chantix does not cause seizure or lower seizure threshold.  Discussed this with Dr. Raymondo Band who confirmed this as well.  She may be confused with bupropion (wellbutrin) which does have slight increased risk of seizure.  I would strongly encourage her to start chantix to help stop smoking in preparation of upcoming surgery.  Not sure why they were changed to Dr. Pollie Meyer and she wasn't, I have not received any requests for pcp change from any of the family.  I would be glad to continue seeing her or she can request PCP change if she feels like she needs to.

## 2012-07-06 NOTE — Telephone Encounter (Signed)
Returned call to patient to discuss PCP change to Dr. Pollie Meyer.  Left message to call back.  Gaylene Brooks, RN

## 2012-07-06 NOTE — Telephone Encounter (Signed)
Patient called back.  Informed of message below from Dr. Ashley Royalty regarding seizures.  Patient verbalized understanding and agreeable to starting Chantix.  Husband and son see Dr. Pollie Meyer and patient wants to have family all have same PCP.  PCP change made in Epic to Dr. Pollie Meyer.  Gaylene Brooks, RN

## 2012-07-06 NOTE — Telephone Encounter (Signed)
Pt states that she rec'd the Chantix in the mail and she specifically told Dr Ashley Royalty that she could not take the pills (causes seizures) - wants to have this changed.  She was also asking why she didn't get changed to Dr Pollie Meyer along with her husband and son.

## 2012-07-06 NOTE — Telephone Encounter (Signed)
Refill requests came through with error message attached. Pharmacy had birth date as 05/10/1956 . Same address , same SS #. I called pharmacy and they found that it had been entered wrong on their part. They will correct.

## 2012-07-06 NOTE — Telephone Encounter (Signed)
Will fwd. To Dr.Matthews for review. .[name]  

## 2012-07-07 ENCOUNTER — Other Ambulatory Visit: Payer: Self-pay | Admitting: Family Medicine

## 2012-07-07 MED ORDER — HYDROCODONE-ACETAMINOPHEN 5-325 MG PO TABS: 1.0000 | ORAL_TABLET | Freq: Every day | ORAL | Status: DC | PRN

## 2012-07-13 ENCOUNTER — Ambulatory Visit: Payer: Medicaid Other | Admitting: Pharmacist

## 2012-07-23 ENCOUNTER — Ambulatory Visit (INDEPENDENT_AMBULATORY_CARE_PROVIDER_SITE_OTHER): Payer: Medicaid Other | Admitting: Family Medicine

## 2012-07-23 ENCOUNTER — Encounter: Payer: Self-pay | Admitting: Family Medicine

## 2012-07-23 VITALS — BP 123/80 | HR 92 | Temp 97.8°F | Ht 61.0 in | Wt 182.4 lb

## 2012-07-23 DIAGNOSIS — J449 Chronic obstructive pulmonary disease, unspecified: Secondary | ICD-10-CM

## 2012-07-23 DIAGNOSIS — R51 Headache: Secondary | ICD-10-CM

## 2012-07-23 DIAGNOSIS — R05 Cough: Secondary | ICD-10-CM

## 2012-07-23 MED ORDER — LEVOFLOXACIN 500 MG PO TABS: 500.0000 mg | ORAL_TABLET | Freq: Every day | ORAL | Status: DC

## 2012-07-23 MED ORDER — ALBUTEROL SULFATE (2.5 MG/3ML) 0.083% IN NEBU
2.5000 mg | INHALATION_SOLUTION | Freq: Once | RESPIRATORY_TRACT | Status: AC
  Administered 2012-07-23: 2.5 mg via RESPIRATORY_TRACT

## 2012-07-23 MED ORDER — PREDNISONE 50 MG PO TABS: ORAL_TABLET | ORAL | Status: DC

## 2012-07-23 MED ORDER — IPRATROPIUM BROMIDE 0.02 % IN SOLN
0.5000 mg | Freq: Once | RESPIRATORY_TRACT | Status: AC
  Administered 2012-07-23: 0.5 mg via RESPIRATORY_TRACT

## 2012-07-23 MED ORDER — KETOROLAC TROMETHAMINE 60 MG/2ML IM SOLN
60.0000 mg | Freq: Once | INTRAMUSCULAR | Status: AC
  Administered 2012-07-23: 60 mg via INTRAMUSCULAR

## 2012-07-23 MED ORDER — DOXYCYCLINE HYCLATE 100 MG PO TABS: 100.0000 mg | ORAL_TABLET | Freq: Two times a day (BID) | ORAL | Status: DC

## 2012-07-23 NOTE — Patient Instructions (Addendum)
Take the Doxycyline two times a day for the next 7 days.  Take the Prednisone 1 pill daily for the next 5 days.    Come back and see [place] next week so we can make sure you're getting better.    Have a good Christmas

## 2012-07-25 NOTE — Assessment & Plan Note (Signed)
Provided patient with albuterol/Atrovent treatment here in clinic. Patient had much relief of symptoms afterwards. Lung exam revealed clearing of most wheezes except for persistent wheezing at baseline. 5 day treatment of prednisone course. Levaquin as antibiotic she is allergic to doxycycline. Did discuss her quitting smoking. States she is thinking about it but has not acted upon this yet. Counseled to quit fully.

## 2012-07-25 NOTE — Assessment & Plan Note (Signed)
Provided patient with Toradol shot today. Patient experienced relief about 15 minutes after receiving her Toradol shot. No red flags on exam. Gave patient red flags which would prompt return to clinic or to the emergency room.

## 2012-07-25 NOTE — Progress Notes (Signed)
  Subjective:    Patient ID: [name], female    DOB: 29-Nov-1955, 56 y.o.   MRN: [id number]  HPI  #1. Bronchitis: Patient has been having increasing shortness of breath, productive cough, subjective fevers at home for the past 10 days. She's using her inhaled albuterol every 4-6 hours without much relief. She is constant and audible wheezing. She has cough that is worse at night. She continues to smoke. No myalgias.  #2. Headaches: Patient has history of migraine headaches. She states she is having a current headache that similar to her her past headaches. This has not been relieved with hydrocodone. She often comes in and has a Toradol shot which relieves her headache. She is asking for these today. She describes headache as bilateral frontal ache. She denies any nausea/ vomiting. No photophobia or phonophobia.  Review of Systems See HPI above for review of systems.       Objective:   Physical Exam BP 123/80  Pulse 92  Temp 97.8 F (36.6 C) (Oral)  Ht 5\' 1"  (1.549 m)  Wt 182 lb 6.4 oz (82.736 kg)  BMI 34.46 kg/m2  SpO2 96% Gen:  Patient sitting on exam table, appears stated age in no acute distress.  Audible wheezing at a distance Head: Normocephalic atraumatic Eyes: EOMI, PERRL, sclera and conjunctiva non-erythematous. Funduscopy within normal limits bilaterally. Nose:  Nasal turbinates grossly enlarged bilaterally. Some exudates noted. Tender to palpation of maxillary sinus  Mouth: Mucosa membranes moist. Tonsils +2, nonenlarged, non-erythematous. Neck: No cervical lymphadenopathy noted Heart:  RRR, no murmurs auscultated. Pulm:  Diffuse wheezing throughout all lung fields. No tachypnea. Minimally increased work of breathing. The patient can talk in complete sentences.          Assessment & Plan:

## 2012-07-27 ENCOUNTER — Ambulatory Visit: Payer: Medicaid Other | Admitting: Pharmacist

## 2012-07-29 ENCOUNTER — Telehealth: Payer: Self-pay | Admitting: *Deleted

## 2012-07-29 NOTE — Telephone Encounter (Addendum)
Tresa Endo at [facility] calling to check on status of surgical clearance form on 06/17/12.  Will route request to Dr. Pollie Meyer and call office back.  Can fax form to [phone].    Gaylene Brooks, RN

## 2012-07-30 NOTE — Telephone Encounter (Signed)
Office faxed form to our office for Dr. Ashley Royalty to complete.  Placed in Dr. Ashley Royalty' box.  Gaylene Brooks, RN

## 2012-07-30 NOTE — Telephone Encounter (Signed)
Spoke with Dr. Ashley Royalty, who saw this patient in November for surgical clearance, as I have not actually met this pt yet. He says he sent a letter to Dr. Thurston Hole regarding surgical clearance. He is willing to send another from but needs to know what form to fill out.

## 2012-08-03 ENCOUNTER — Other Ambulatory Visit: Payer: Self-pay | Admitting: Physician Assistant

## 2012-08-03 NOTE — H&P (Signed)
TOTAL KNEE ADMISSION H&P  Patient is being admitted for right total knee arthroplasty.  Subjective:  Chief Complaint:right knee pain.  HPI: [name], 56 y.o. female, has a history of pain and functional disability in the right knee due to arthritis and has failed non-surgical conservative treatments for greater than 12 weeks to includeNSAID's and/or analgesics, corticosteriod injections, flexibility and strengthening excercises, supervised PT with diminished ADL's post treatment, use of assistive devices, weight reduction as appropriate and activity modification.  Onset of symptoms was gradual, starting 2 years ago with gradually worsening course since that time. The patient noted prior procedures on the knee to include  arthroscopy on the right knee(s).  Patient currently rates pain in the right knee(s) at 8 out of 10 with activity. Patient has night pain, worsening of pain with activity and weight bearing, pain that interferes with activities of daily living, crepitus and joint swelling.  Patient has evidence of subchondral sclerosis, periarticular osteophytes and joint space narrowing by imaging studies.  There is no active infection.  Patient Active Problem List   Diagnosis Date Noted  . Pre-op evaluation 06/27/2012  . Left shoulder pain 06/27/2012  . Headache 05/09/2012  . Edema 02/04/2012  . POSTMENOPAUSAL STATUS 06/04/2010  . ANXIETY 02/13/2010  . PANIC ATTACK 10/31/2009  . SLEEP APNEA, OBSTRUCTIVE, MODERATE 02/19/2009  . SINUSITIS, CHRONIC 08/17/2008  . UNSPECIFIED VITAMIN D DEFICIENCY 01/21/2008  . OSTEOPENIA 11/24/2007  . IRRITABLE BOWEL SYNDROME 11/17/2007  . ALLERGIC RHINITIS 09/24/2007  . COPD 05/12/2007  . DIABETES MELLITUS II, UNCOMPLICATED 10/08/2006  . HYPERLIPIDEMIA 10/08/2006  . OBESITY, NOS 10/08/2006  . TOBACCO DEPENDENCE 10/08/2006  . MIGRAINE, COMMON, INTRACTABLE 10/08/2006  . GLAUCOMA 10/08/2006  . GASTROESOPHAGEAL REFLUX, NO ESOPHAGITIS 10/08/2006  .  HERNIA, HIATAL, NONCONGENITAL 10/08/2006  . INCONTINENCE, STRESS, FEMALE 10/08/2006  . DJD (degenerative joint disease) of knee 10/08/2006  . CONVULSIONS, SEIZURES, NOS 10/08/2006   Past Medical History  Diagnosis Date  . Allergic rhinitis   . Hypertension   . Hyperlipidemia   . GERD (gastroesophageal reflux disease)   . Glaucoma(365)   . Neck pain     Cervical disc  . IBS (irritable bowel syndrome)   . Esophageal stricture   . Diverticulosis   . Right leg DVT 1982    groin  . COPD (chronic obstructive pulmonary disease)   . Emphysema   . Pneumonia 2012; 04/27/2012  . Chronic bronchitis     "keep it all the time" (04/27/2012)  . Exertional dyspnea   . OSA on CPAP   . Type II diabetes mellitus     No meds since weight loss  . Migraine   . Epilepsy   . DJD (degenerative joint disease)   . Arthritis     "hands and legs and feet" (04/27/2012)  . Depression     Past Surgical History  Procedure Date  . Tubal ligation   . Knee arthroscopy 1990's    left  . Carpal tunnel release ~ 2008    right  . Shoulder open rotator cuff repair ~ 2010    left  . Abdominal hysterectomy 2001    "partial"     (Not in a hospital admission) Allergies  Allergen Reactions  . Amoxicillin Anaphylaxis  . Azithromycin Swelling and Rash    "swelling all over"  . Doxycycline Hyclate Anaphylaxis and Rash  . Penicillins Shortness Of Breath and Rash    "almost died"  . Sulfonamide Derivatives Rash  . Clarithromycin Hives  . Fluticasone-Salmeterol Other (  See Comments)    REACTION: ulcers in mouth  . Nortriptyline Other (See Comments)    Per patient had "kidney problems" on this medication   Current Outpatient Prescriptions on File Prior to Visit  Medication Sig Dispense Refill  . acetaminophen (TYLENOL) 325 MG tablet Take 975 mg by mouth every 6 (six) hours as needed. For pain      . albuterol (PROAIR HFA) 108 (90 BASE) MCG/ACT inhaler Inhale 2 puffs into the lungs every 6 (six) hours as  needed. Shortness of breath      . albuterol (PROVENTIL) (2.5 MG/3ML) 0.083% nebulizer solution Take 2.5 mg by nebulization every 6 (six) hours as needed. For shortness of breath      . Blood Glucose Monitoring Suppl (BLOOD GLUCOSE METER) kit Use to check blood glucose daily.  Accuchek preffered by patient  1 each  0  . Cholecalciferol (VITAMIN D3) 2000 UNITS TABS Take 1 tablet by mouth daily.      . cyclobenzaprine (FLEXERIL) 10 MG tablet Take 1 tablet (10 mg total) by mouth 3 (three) times daily as needed for muscle spasms.  30 tablet  2  . dicyclomine (BENTYL) 20 MG tablet Take 1 tablet (20mg ) by mouth four times daily- Before meals and at bedtime  120 tablet  5  . doxycycline (VIBRA-TABS) 100 MG tablet Take 1 tablet (100 mg total) by mouth 2 (two) times daily.  14 tablet  0  . fluticasone (FLONASE) 50 MCG/ACT nasal spray Place 2 sprays into the nose daily.  16 g  1  . Glucose Blood (BLOOD GLUCOSE TEST STRIPS) STRP Use to test blood glucose daily.  100 each  12  . HYDROcodone-acetaminophen (NORCO/VICODIN) 5-325 MG per tablet Take 1 tablet by mouth daily as needed for pain.  30 tablet  0  . hydrOXYzine (ATARAX/VISTARIL) 50 MG tablet Take 62.5 mg by mouth every 6 (six) hours as needed. For itching      . lactulose (CHRONULAC) 10 GM/15ML solution take 30-52mL as needed for soft bowel movements.  disp       . lamoTRIgine (LAMICTAL) 150 MG tablet Take 1 tablet (150 mg total) by mouth 2 (two) times daily.  60 tablet  3  . levofloxacin (LEVAQUIN) 500 MG tablet Take 1 tablet (500 mg total) by mouth daily. Please ignore prior Doxycyline prescription  7 tablet  0  . meloxicam (MOBIC) 7.5 MG tablet Take 7.5 mg by mouth daily.      Marland Kitchen omeprazole (PRILOSEC) 40 MG capsule Take 40 mg by mouth 2 (two) times daily.      . pramoxine-hydrocortisone (ANALPRAM HC) cream Apply 1 application topically daily as needed. For itching      . predniSONE (DELTASONE) 50 MG tablet Take 1 tab daily x 5 days  5 tablet  0  .  simvastatin (ZOCOR) 40 MG tablet Take 1 tablet (40 mg total) by mouth at bedtime.  30 tablet  5  . SPIRIVA HANDIHALER 18 MCG inhalation capsule Place 1 capsule into inhaler and inhale daily  30 capsule  1  . topiramate (TOPAMAX) 100 MG tablet Take 100 mg by mouth 2 (two) times daily.        . varenicline (CHANTIX) 0.5 MG tablet 0.5 mg daily for 3 days, then 0.5 mg bid for 4 days.  Then start taking 1mg  tablet  11 tablet  0  . varenicline (CHANTIX) 1 MG tablet Take 1 tablet (1 mg total) by mouth 2 (two) times daily.  30 tablet  2   Current Facility-Administered Medications on File Prior to Visit  Medication Dose Route Frequency Provider Last Rate Last Dose  . diphenhydrAMINE (BENADRYL) injection 50 mg  50 mg Intramuscular Once Everrett Coombe, DO         History  Substance Use Topics  . Smoking status: Current Every Day Smoker -- 0.5 packs/day for 36 years    Types: Cigarettes    Start date: 08/12/1975  . Smokeless tobacco: Never Used     Comment: has to quitt before surgery   . Alcohol Use: No    Family History  Problem Relation Age of Onset  . Heart disease Maternal Grandfather   . Heart disease      uncle  . Diabetes Maternal Grandfather   . Breast cancer Maternal Aunt   . Alcohol abuse Father   . Bipolar disorder Son   . Muscular dystrophy Son   . Diabetes      aunts  . Diabetes      uncles  . Heart disease Sister   . Lung cancer Father     was a smoker  . Colon cancer Neg Hx   . Lung cancer Mother     was a smoker  . Other Sister     blood clotting disorder  . Stroke Mother   . Heart disease Son   . Emphysema Father     was a smoker     Review of Systems  Constitutional: Negative.   HENT: Negative.   Respiratory: Positive for cough, sputum production, shortness of breath and wheezing.   Cardiovascular: Negative.   Gastrointestinal: Negative.   Genitourinary: Negative.   Musculoskeletal: Positive for joint pain.  Skin: Negative.   Neurological: Negative.    Endo/Heme/Allergies: Negative.   Psychiatric/Behavioral: Negative.     Objective:  Physical Exam  Constitutional: She is oriented to person, place, and time. She appears well-developed and well-nourished.  HENT:  Head: Normocephalic and atraumatic.  Eyes: Conjunctivae normal and EOM are normal. Pupils are equal, round, and reactive to light.  Neck: Neck supple.  Cardiovascular: Normal rate and regular rhythm.   Respiratory: Effort normal. She has wheezes.  GI: Soft. Bowel sounds are normal.  Genitourinary:       Not pertinent to current symptomatology therefore not examined.  Musculoskeletal:       Examination of both knees reveals pain bilaterally, 1+ crepitation 1+ synovitis range of motion from -5 to 125 degrees both knees are stable with moderate varus deformity diffuse pain and normal patella tracking.   Neurological: She is alert and oriented to person, place, and time.  Skin: Skin is warm and dry.  Psychiatric: She has a normal mood and affect. Her behavior is normal. Judgment and thought content normal.    Vital signs in last 24 hours: Last recorded: 12/24 0900   BP: 117/76 Pulse: 89  Temp: 97.4 F (36.3 C)    Height: 5\' 1"  (1.549 m) SpO2: 97  Weight: 83.462 kg (184 lb)    Labs:   Estimated Body mass index is 34.46 kg/(m^2) as calculated from the following:   Height as of 07/23/12: 5\' 1" (1.549 m).   Weight as of 07/23/12: 182 lb 6.4 oz(82.736 kg).   Imaging Review Plain radiographs demonstrate severe degenerative joint disease of the right knee(s). The overall alignment issignificant varus. The bone quality appears to be good for age and reported activity level.  Assessment/Plan:  End stage arthritis, right knee   The patient history, physical examination, clinical  judgment of the provider and imaging studies are consistent with end stage degenerative joint disease of the right knee(s) and total knee arthroplasty is deemed medically necessary. The treatment  options including medical management, injection therapy arthroscopy and arthroplasty were discussed at length. The risks and benefits of total knee arthroplasty were presented and reviewed. The risks due to aseptic loosening, infection, stiffness, patella tracking problems, thromboembolic complications and other imponderables were discussed. The patient acknowledged the explanation, agreed to proceed with the plan and consent was signed. Patient is being admitted for inpatient treatment for surgery, pain control, PT, OT, prophylactic antibiotics, VTE prophylaxis, progressive ambulation and ADL's and discharge planning. The patient is planning to be discharged home with home health services

## 2012-08-05 ENCOUNTER — Encounter (HOSPITAL_COMMUNITY): Payer: Self-pay | Admitting: *Deleted

## 2012-08-05 ENCOUNTER — Emergency Department (HOSPITAL_COMMUNITY)
Admission: EM | Admit: 2012-08-05 | Discharge: 2012-08-05 | Disposition: A | Discharge status: Home / Self Care | Payer: Medicaid Other | Attending: Emergency Medicine | Admitting: Emergency Medicine

## 2012-08-05 DIAGNOSIS — IMO0002 Reserved for concepts with insufficient information to code with codable children: Secondary | ICD-10-CM | POA: Insufficient documentation

## 2012-08-05 DIAGNOSIS — E785 Hyperlipidemia, unspecified: Secondary | ICD-10-CM | POA: Insufficient documentation

## 2012-08-05 DIAGNOSIS — J4489 Other specified chronic obstructive pulmonary disease: Secondary | ICD-10-CM | POA: Insufficient documentation

## 2012-08-05 DIAGNOSIS — Z791 Long term (current) use of non-steroidal anti-inflammatories (NSAID): Secondary | ICD-10-CM | POA: Insufficient documentation

## 2012-08-05 DIAGNOSIS — Z8719 Personal history of other diseases of the digestive system: Secondary | ICD-10-CM | POA: Insufficient documentation

## 2012-08-05 DIAGNOSIS — H409 Unspecified glaucoma: Secondary | ICD-10-CM | POA: Insufficient documentation

## 2012-08-05 DIAGNOSIS — G4733 Obstructive sleep apnea (adult) (pediatric): Secondary | ICD-10-CM | POA: Insufficient documentation

## 2012-08-05 DIAGNOSIS — Z8709 Personal history of other diseases of the respiratory system: Secondary | ICD-10-CM | POA: Insufficient documentation

## 2012-08-05 DIAGNOSIS — K649 Unspecified hemorrhoids: Secondary | ICD-10-CM

## 2012-08-05 DIAGNOSIS — Z8639 Personal history of other endocrine, nutritional and metabolic disease: Secondary | ICD-10-CM | POA: Insufficient documentation

## 2012-08-05 DIAGNOSIS — N390 Urinary tract infection, site not specified: Secondary | ICD-10-CM | POA: Insufficient documentation

## 2012-08-05 DIAGNOSIS — K589 Irritable bowel syndrome without diarrhea: Secondary | ICD-10-CM | POA: Insufficient documentation

## 2012-08-05 DIAGNOSIS — Z8679 Personal history of other diseases of the circulatory system: Secondary | ICD-10-CM | POA: Insufficient documentation

## 2012-08-05 DIAGNOSIS — R1084 Generalized abdominal pain: Secondary | ICD-10-CM | POA: Insufficient documentation

## 2012-08-05 DIAGNOSIS — Z86718 Personal history of other venous thrombosis and embolism: Secondary | ICD-10-CM | POA: Insufficient documentation

## 2012-08-05 DIAGNOSIS — M129 Arthropathy, unspecified: Secondary | ICD-10-CM | POA: Insufficient documentation

## 2012-08-05 DIAGNOSIS — Z862 Personal history of diseases of the blood and blood-forming organs and certain disorders involving the immune mechanism: Secondary | ICD-10-CM | POA: Insufficient documentation

## 2012-08-05 DIAGNOSIS — Z8659 Personal history of other mental and behavioral disorders: Secondary | ICD-10-CM | POA: Insufficient documentation

## 2012-08-05 DIAGNOSIS — J449 Chronic obstructive pulmonary disease, unspecified: Secondary | ICD-10-CM | POA: Insufficient documentation

## 2012-08-05 DIAGNOSIS — I10 Essential (primary) hypertension: Secondary | ICD-10-CM | POA: Insufficient documentation

## 2012-08-05 DIAGNOSIS — K648 Other hemorrhoids: Secondary | ICD-10-CM | POA: Insufficient documentation

## 2012-08-05 DIAGNOSIS — Z79899 Other long term (current) drug therapy: Secondary | ICD-10-CM | POA: Insufficient documentation

## 2012-08-05 DIAGNOSIS — M1990 Unspecified osteoarthritis, unspecified site: Secondary | ICD-10-CM | POA: Insufficient documentation

## 2012-08-05 DIAGNOSIS — K219 Gastro-esophageal reflux disease without esophagitis: Secondary | ICD-10-CM | POA: Insufficient documentation

## 2012-08-05 DIAGNOSIS — F17200 Nicotine dependence, unspecified, uncomplicated: Secondary | ICD-10-CM | POA: Insufficient documentation

## 2012-08-05 DIAGNOSIS — Z8701 Personal history of pneumonia (recurrent): Secondary | ICD-10-CM | POA: Insufficient documentation

## 2012-08-05 DIAGNOSIS — K644 Residual hemorrhoidal skin tags: Secondary | ICD-10-CM | POA: Insufficient documentation

## 2012-08-05 DIAGNOSIS — R42 Dizziness and giddiness: Secondary | ICD-10-CM | POA: Insufficient documentation

## 2012-08-05 DIAGNOSIS — G40909 Epilepsy, unspecified, not intractable, without status epilepticus: Secondary | ICD-10-CM | POA: Insufficient documentation

## 2012-08-05 LAB — CBC WITH DIFFERENTIAL/PLATELET
Basophils Relative: 0 % (ref 0–1)
Hemoglobin: 12 g/dL (ref 12.0–15.0)
Lymphs Abs: 3 10*3/uL (ref 0.7–4.0)
Monocytes Relative: 7 % (ref 3–12)
Neutro Abs: 7.2 10*3/uL (ref 1.7–7.7)
Neutrophils Relative %: 65 % (ref 43–77)
RBC: 3.83 MIL/uL — ABNORMAL LOW (ref 3.87–5.11)

## 2012-08-05 LAB — URINALYSIS, ROUTINE W REFLEX MICROSCOPIC
Glucose, UA: NEGATIVE mg/dL
Specific Gravity, Urine: 1.028 (ref 1.005–1.030)
Urobilinogen, UA: 0.2 mg/dL (ref 0.0–1.0)

## 2012-08-05 LAB — HEPATIC FUNCTION PANEL
Albumin: 3.3 g/dL — ABNORMAL LOW (ref 3.5–5.2)
Total Bilirubin: 0.1 mg/dL — ABNORMAL LOW (ref 0.3–1.2)

## 2012-08-05 LAB — POCT I-STAT, CHEM 8
Glucose, Bld: 102 mg/dL — ABNORMAL HIGH (ref 70–99)
HCT: 36 % (ref 36.0–46.0)
Hemoglobin: 12.2 g/dL (ref 12.0–15.0)
Potassium: 4 mEq/L (ref 3.5–5.1)
Sodium: 143 mEq/L (ref 135–145)

## 2012-08-05 LAB — URINE MICROSCOPIC-ADD ON

## 2012-08-05 LAB — OCCULT BLOOD, POC DEVICE: Fecal Occult Bld: POSITIVE — AB

## 2012-08-05 MED ORDER — CIPROFLOXACIN HCL 500 MG PO TABS: 500.0000 mg | ORAL_TABLET | Freq: Two times a day (BID) | ORAL | Status: DC

## 2012-08-05 MED ORDER — HYDROCODONE-ACETAMINOPHEN 5-325 MG PO TABS
2.0000 | ORAL_TABLET | Freq: Once | ORAL | Status: AC
  Administered 2012-08-05: 2 via ORAL
  Filled 2012-08-05: qty 2

## 2012-08-05 MED ORDER — SODIUM CHLORIDE 0.9 % IV BOLUS (SEPSIS)
1000.0000 mL | Freq: Once | INTRAVENOUS | Status: AC
  Administered 2012-08-05: 1000 mL via INTRAVENOUS

## 2012-08-05 MED ORDER — HYDROCORTISONE 1 % EX CREA: TOPICAL_CREAM | CUTANEOUS | Status: DC

## 2012-08-05 MED ORDER — HYDROMORPHONE HCL PF 1 MG/ML IJ SOLN
1.0000 mg | Freq: Once | INTRAMUSCULAR | Status: AC
  Administered 2012-08-05: 1 mg via INTRAVENOUS
  Filled 2012-08-05: qty 1

## 2012-08-05 NOTE — ED Notes (Signed)
Stomach hurting bad - generalized; frequent bm accompanied by blood - moderate. No n/v.

## 2012-08-05 NOTE — ED Notes (Signed)
Pt d/c home in NAD. Pt husband to drive pt home. Pt voiced understanding of d/c instructions, stressed importance of taking abx to completion and follow up.

## 2012-08-05 NOTE — ED Provider Notes (Signed)
History     CSN: [id number]  Arrival date & time 08/05/12  1117   First MD Initiated Contact with Patient 08/05/12 1206      No chief complaint on file.   (Consider location/radiation/quality/duration/timing/severity/associated sxs/prior treatment) HPI Comments: 56 year old female with an extensive medical history presents to the emergency department complaining of rectal bleeding beginning this morning. States she has a history of hemorrhoids followed by Dr. Juanda Chance, and says she believes the bleeding is coming from that. She has normal formed stool with bright red blood mixed in and on the toilet tissue. Denies melena. States there is been a lot of blood this morning and she is beginning to feel lightheaded. Her anus is very tender. Admits to associated generalized abdominal pain for the past 3 days. She tried taking Tylenol without relief. Describes pain as sharp and cramping rated 9/10. Denies nausea, vomiting, fever, chills, chest pain, shortness of breath or urinary symptoms.  The history is provided by the patient.    Past Medical History  Diagnosis Date  . Allergic rhinitis   . Hypertension   . Hyperlipidemia   . GERD (gastroesophageal reflux disease)   . Glaucoma(365)   . Neck pain     Cervical disc  . IBS (irritable bowel syndrome)   . Esophageal stricture   . Diverticulosis   . Right leg DVT 1982    groin  . COPD (chronic obstructive pulmonary disease)   . Emphysema   . Pneumonia 2012; 04/27/2012  . Chronic bronchitis     "keep it all the time" (04/27/2012)  . Exertional dyspnea   . OSA on CPAP   . Type II diabetes mellitus     No meds since weight loss  . Migraine   . Epilepsy   . DJD (degenerative joint disease)   . Arthritis     "hands and legs and feet" (04/27/2012)  . Depression     Past Surgical History  Procedure Date  . Tubal ligation   . Knee arthroscopy 1990's    left  . Carpal tunnel release ~ 2008    right  . Shoulder open rotator cuff  repair ~ 2010    left  . Abdominal hysterectomy 2001    "partial"    Family History  Problem Relation Age of Onset  . Heart disease Maternal Grandfather   . Heart disease      uncle  . Diabetes Maternal Grandfather   . Breast cancer Maternal Aunt   . Alcohol abuse Father   . Bipolar disorder Son   . Muscular dystrophy Son   . Diabetes      aunts  . Diabetes      uncles  . Heart disease Sister   . Lung cancer Father     was a smoker  . Colon cancer Neg Hx   . Lung cancer Mother     was a smoker  . Other Sister     blood clotting disorder  . Stroke Mother   . Heart disease Son   . Emphysema Father     was a smoker    History  Substance Use Topics  . Smoking status: Current Every Day Smoker -- 0.5 packs/day for 36 years    Types: Cigarettes    Start date: 08/12/1975  . Smokeless tobacco: Never Used     Comment: has to quitt before surgery   . Alcohol Use: No    OB History    Grav Para Term Preterm Abortions TAB  SAB Ect Mult Living                  Review of Systems  Constitutional: Negative for fever and chills.  HENT: Negative.   Respiratory: Negative for shortness of breath.   Cardiovascular: Negative for chest pain.  Gastrointestinal: Positive for abdominal pain and blood in stool. Negative for nausea and vomiting.  Genitourinary: Negative for dysuria, urgency, frequency and pelvic pain.  Musculoskeletal: Negative for back pain.  Skin: Negative.   Neurological: Positive for light-headedness.  Psychiatric/Behavioral: Negative for confusion.    Allergies  Amoxicillin; Azithromycin; Doxycycline hyclate; Penicillins; Sulfonamide derivatives; Clarithromycin; Fluticasone-salmeterol; and Nortriptyline  Home Medications   Current Outpatient Rx  Name  Route  Sig  Dispense  Refill  . ACETAMINOPHEN 325 MG PO TABS   Oral   Take 975 mg by mouth every 6 (six) hours as needed. For pain         . ALBUTEROL SULFATE HFA 108 (90 BASE) MCG/ACT IN AERS    Inhalation   Inhale 2 puffs into the lungs every 6 (six) hours as needed. Shortness of breath         . ALBUTEROL SULFATE (2.5 MG/3ML) 0.083% IN NEBU   Nebulization   Take 2.5 mg by nebulization every 6 (six) hours as needed. For shortness of breath         . VITAMIN D3 2000 UNITS PO TABS   Oral   Take 1 tablet by mouth daily.         Marland Kitchen DICYCLOMINE HCL 20 MG PO TABS      Take 1 tablet (20mg ) by mouth four times daily- Before meals and at bedtime   120 tablet   5     Refill [id number]   . LAMOTRIGINE 150 MG PO TABS      Take 1 tablet (150 mg total) by mouth 2 (two) times daily.   60 tablet   3     Refill [id number]   . MELOXICAM 7.5 MG PO TABS   Oral   Take 7.5 mg by mouth daily.         Marland Kitchen OMEPRAZOLE 40 MG PO CPDR   Oral   Take 40 mg by mouth 2 (two) times daily.         Marland Kitchen SIMVASTATIN 40 MG PO TABS   Oral   Take 1 tablet (40 mg total) by mouth at bedtime.   30 tablet   5     Refill [id number]   . SPIRIVA HANDIHALER 18 MCG IN CAPS      Place 1 capsule into inhaler and inhale daily   30 capsule   1     Refill [id number]   . TOPIRAMATE 100 MG PO TABS   Oral   Take 100 mg by mouth 2 (two) times daily.           Marland Kitchen VARENICLINE TARTRATE 0.5 MG PO TABS      0.5 mg daily for 3 days, then 0.5 mg bid for 4 days.  Then start taking 1mg  tablet   11 tablet   0   . BLOOD GLUCOSE METER KIT      Use to check blood glucose daily.  Accuchek preffered by patient   1 each   0   . BLOOD GLUCOSE TEST VI STRP      Use to test blood glucose daily.   100 each   12   . HYDROCODONE-ACETAMINOPHEN 5-325 MG PO TABS   Oral  Take 1 tablet by mouth daily as needed for pain.   30 tablet   0     Must make appointment with new PCP before future r ...   . PRAMOXINE-HC 1-1 % EX CREA   Topical   Apply 1 application topically daily as needed. For itching         . VARENICLINE TARTRATE 1 MG PO TABS   Oral   Take 1 tablet (1 mg total) by mouth 2 (two) times daily.   30  tablet   2     BP 109/62  Pulse 84  Temp 97.8 F (36.6 C) (Oral)  Resp 14  SpO2 95%  Physical Exam  Nursing note and vitals reviewed. Constitutional: She is oriented to person, place, and time. She appears well-developed. No distress.       Overweight  HENT:  Head: Normocephalic and atraumatic.  Mouth/Throat: Oropharynx is clear and moist.  Eyes: Conjunctivae normal are normal. No scleral icterus.  Neck: Normal range of motion. Neck supple.  Cardiovascular: Normal rate, regular rhythm, normal heart sounds and intact distal pulses.   Pulmonary/Chest: Effort normal and breath sounds normal.  Abdominal: Soft. Normal appearance and bowel sounds are normal. There is generalized tenderness. There is guarding. There is no rigidity and no rebound.       No peritoneal signs.  Genitourinary: Rectal exam shows external hemorrhoid (actively bleeding, tender), internal hemorrhoid and tenderness. Guaiac positive stool.  Musculoskeletal: Normal range of motion. She exhibits no edema.  Neurological: She is alert and oriented to person, place, and time.  Skin: Skin is warm and dry.  Psychiatric: She has a normal mood and affect. Her behavior is normal.    ED Course  Procedures (including critical care time)  Labs Reviewed  CBC WITH DIFFERENTIAL - Abnormal; Notable for the following:    WBC 11.1 (*)     RBC 3.83 (*)     All other components within normal limits  URINALYSIS, ROUTINE W REFLEX MICROSCOPIC - Abnormal; Notable for the following:    Hgb urine dipstick MODERATE (*)     Leukocytes, UA MODERATE (*)     All other components within normal limits  POCT I-STAT, CHEM 8 - Abnormal; Notable for the following:    Chloride 113 (*)     Glucose, Bld 102 (*)     Calcium, Ion 1.25 (*)     All other components within normal limits  HEPATIC FUNCTION PANEL - Abnormal; Notable for the following:    Albumin 3.3 (*)     Total Bilirubin 0.1 (*)     All other components within normal limits    URINE MICROSCOPIC-ADD ON - Abnormal; Notable for the following:    Squamous Epithelial / LPF FEW (*)     All other components within normal limits  OCCULT BLOOD, POC DEVICE - Abnormal; Notable for the following:    Fecal Occult Bld POSITIVE (*)     All other components within normal limits  LIPASE, BLOOD   No results found.   1. Hemorrhoid   2. UTI (urinary tract infection)       MDM  56 year old female with tender bleeding hemorrhoid. The amount of bright red blood is not gross. No clotting. No evidence of thrombosis. Hemoglobin and hematocrit stable. After receiving fluids patient no longer feels lightheaded. I prescribed her ProctoCream and advised her to followup with her gastroenterologist Dr. Juanda Chance. Patient also has a urinary tract infection which will be treated with Cipro rather  than Keflex due to her allergy to amoxicillin of anaphylaxis. Patient states her understanding of plan and is agreeable. Return precautions discussed. She will followup with her primary care doctor in one week.  Trevor Mace, PA-C 08/05/12 1441

## 2012-08-05 NOTE — ED Notes (Signed)
Pt ambulated to restroom, stand by assist, NAD, ambulated with quick, steady gait,

## 2012-08-06 ENCOUNTER — Telehealth: Payer: Self-pay | Admitting: Family Medicine

## 2012-08-06 ENCOUNTER — Encounter: Payer: Self-pay | Admitting: Internal Medicine

## 2012-08-06 ENCOUNTER — Ambulatory Visit (INDEPENDENT_AMBULATORY_CARE_PROVIDER_SITE_OTHER): Payer: Medicaid Other | Admitting: Internal Medicine

## 2012-08-06 ENCOUNTER — Ambulatory Visit (INDEPENDENT_AMBULATORY_CARE_PROVIDER_SITE_OTHER)
Admission: RE | Admit: 2012-08-06 | Discharge: 2012-08-06 | Disposition: A | Discharge status: Home / Self Care | Payer: Medicaid Other | Source: Ambulatory Visit | Attending: Internal Medicine | Admitting: Internal Medicine

## 2012-08-06 VITALS — BP 118/60 | HR 95 | Temp 97.7°F | Ht 61.0 in | Wt 185.0 lb

## 2012-08-06 DIAGNOSIS — J449 Chronic obstructive pulmonary disease, unspecified: Secondary | ICD-10-CM

## 2012-08-06 DIAGNOSIS — F17200 Nicotine dependence, unspecified, uncomplicated: Secondary | ICD-10-CM

## 2012-08-06 LAB — PULMONARY FUNCTION TEST

## 2012-08-06 MED ORDER — ACLIDINIUM BROMIDE 400 MCG/ACT IN AEPB: 1.0000 | INHALATION_SPRAY | Freq: Two times a day (BID) | RESPIRATORY_TRACT | Status: DC

## 2012-08-06 NOTE — Assessment & Plan Note (Addendum)
-   PFT's 08/06/2012 FEV1  1.60 (74%) ratio 70  And no better p B2,  DLCO 48% corrects to 110  I had an extended summary discussion with the patient today lasting 15 to 20 minutes of a 25 minute visit on the following issues:   GOLD O so no risk additional risk for surgery on knee x for onging smoking, discussed separately.  Ok to continue tudorza to control symptoms though if quits smoking likely doesn't really need it and never will

## 2012-08-06 NOTE — Telephone Encounter (Signed)
Called patient and told her she has a hospital follow up on 08/12/2012. She will need to keep that appointment and will be deferred at that time if needed. She also has an upcoming appointment for colonoscopy with [facility] in March.[name], [name]

## 2012-08-06 NOTE — Progress Notes (Signed)
  Subjective:    Patient ID: ZAYANNA PUNDT, female    DOB: [birth year] MRN: [id number]  HPI  45 yowf active smoker with dx of copd aware of sob around 2000 progressive assoc with exacerbation every few months just [facility]arged [facility] in 04/2012 for ? CAP with aecopd referred by Dr Wyline Mood for preop for R TKR Jan 13 14   06/18/2012 Pulmonary preop cc doe x one aisle and since last flare using inhaler saba maybe twice weekly and sleeping ok on cpap maintained on spiriva and can't take advair due to mouth irritation.  Min am cough/ congestion with thick white mucus.  rec Try to minimize smoking Try tudorza one puff twice daily instead of spiriva on trial basis for 2 weeks - call if you like it better and we'll send you a prescription     08/06/2012 f/u ov/Cylee Dattilo still smoking cc not limitedby breathing but by knees,  thinks tudorza works better but only using once a day.    No obvious daytime variabilty to cough or sob or cp or chest tightness, subjective wheeze overt sinus or hb symptoms. No unusual exp hx or h/o childhood pna/ asthma or premature birth to her knowledge.   Sleeping ok without nocturnal  or early am exacerbation  of respiratory  c/o's or need for noct saba. Also denies any obvious fluctuation of symptoms with weather or environmental changes or other aggravating or alleviating factors except as outlined above   ROS  The following are not active complaints unless bolded sore throat, dysphagia, dental problems, itching, sneezing,  nasal congestion or excess/ purulent secretions, ear ache,   fever, chills, sweats, unintended wt loss, pleuritic or exertional cp, hemoptysis,  orthopnea pnd or leg swelling, presyncope, palpitations, heartburn, abdominal pain, anorexia, nausea, vomiting, diarrhea  or change in bowel or urinary habits, change in stools or urine, dysuria,hematuria,  rash, arthralgias esp r knee, visual complaints, headache, numbness weakness or ataxia or problems with walking or  coordination,  change in mood/affect or memory.           Objective:   Physical Exam  amb obese wf nad 08/06/2012  185  Wt Readings from Last 3 Encounters:  06/18/12 183 lb 9.6 oz (83.28 kg)  05/21/12 182 lb (82.555 kg)  05/05/12 181 lb (82.101 kg)    HEENT mild turbinate edema.  Oropharynx no thrush or excess pnd or cobblestoning.  No JVD or cervical adenopathy. Mild accessory muscle hypertrophy. Trachea midline, nl thryroid. Chest was slt hyperinflated by percussion with diminished breath sounds and min  increased exp time without wheeze. Hoover sign positive at end inspiration. Regular rate and rhythm without murmur gallop or rub or increase P2 or edema.  Abd: no hsm, nl excursion. Ext warm without cyanosis or clubbing.     CXR  08/06/2012 :  No evidence of acute cardiopulmonary disease.        Assessment & Plan:

## 2012-08-06 NOTE — Progress Notes (Signed)
PFT done today. Merle Whitehorn,CMA  

## 2012-08-06 NOTE — Telephone Encounter (Signed)
Patient is calling requesting a referral to Dr. Juanda Chance due to her rectal bleeding.  She was in the ER yesterday for this.

## 2012-08-06 NOTE — ED Provider Notes (Signed)
Medical screening examination/treatment/procedure(s) were performed by non-physician practitioner and as supervising physician I was immediately available for consultation/collaboration.   Richardean Canal, MD 08/06/12 [phone]

## 2012-08-06 NOTE — Patient Instructions (Addendum)
[name] is one twice daily  Ideally you need to stop smoking completely  For two weeks before surgery  But you are cleared for surgery as you don't have significant copd and never will if you stop smoking now  Please remember to go to the   x-ray department downstairs for your tests - we will call you with the results when they are available.

## 2012-08-06 NOTE — Assessment & Plan Note (Signed)
>   3 min discussion I reviewed the Flethcher curve with patient that basically indicates  if you quit smoking when your best day FEV1 is still well preserved  (which hers certainly is) it is highly unlikely you will progress to severe disease and informed the patient there was no medication on the market that has proven to change the curve or the likelihood of progression.  Therefore stopping smoking and maintaining abstinence is the most important aspect of care, not choice of inhalers or for that matter, doctors.

## 2012-08-10 ENCOUNTER — Encounter (HOSPITAL_COMMUNITY): Payer: Self-pay | Admitting: Respiratory Therapy

## 2012-08-10 NOTE — Progress Notes (Signed)
Quick Note:  Called and spoke with patient, informed her results/recs as listed below per Dr. Sherene Sires. Pt verbalized understanding and nothing further needed at this time. ______

## 2012-08-11 NOTE — Telephone Encounter (Signed)
Completed.

## 2012-08-12 ENCOUNTER — Encounter: Payer: Self-pay | Admitting: Family Medicine

## 2012-08-12 ENCOUNTER — Ambulatory Visit (INDEPENDENT_AMBULATORY_CARE_PROVIDER_SITE_OTHER): Payer: Medicaid Other | Admitting: Family Medicine

## 2012-08-12 VITALS — BP 114/75 | HR 106 | Temp 97.9°F | Ht 61.0 in | Wt 184.0 lb

## 2012-08-12 DIAGNOSIS — K649 Unspecified hemorrhoids: Secondary | ICD-10-CM

## 2012-08-12 DIAGNOSIS — R51 Headache: Secondary | ICD-10-CM

## 2012-08-12 MED ORDER — HYDROCORTISONE ACETATE 25 MG RE SUPP: 25.0000 mg | Freq: Two times a day (BID) | RECTAL | Status: DC

## 2012-08-12 MED ORDER — KETOROLAC TROMETHAMINE 60 MG/2ML IM SOLN
60.0000 mg | Freq: Once | INTRAMUSCULAR | Status: AC
  Administered 2012-08-12: 60 mg via INTRAMUSCULAR

## 2012-08-12 NOTE — Addendum Note (Signed)
Addended by: Jone Baseman D on: 08/12/2012 03:29 PM   Modules accepted: Orders

## 2012-08-12 NOTE — Patient Instructions (Signed)
Take the Anusol suppository if you start having trouble with the hemorrhoids again.    Otherwise everything looks like it's going well!    Toradol shot for pain relief.    Have a Happy New Year!

## 2012-08-12 NOTE — Assessment & Plan Note (Signed)
Bleeding is resolved per patient. I provided her a prescription for Anusol suppository for relief of either pain, any further bleeding, resolution of hemorrhoids. As these have resolved we discussed that she probably doesn't need referral to GI physician. Patient had a normal colonoscopy back in March of this year which did not show diverticulum. If recurs and does not clear with Anusol she may at that time warrent referral to gastroenterologist.

## 2012-08-12 NOTE — Assessment & Plan Note (Signed)
Toradol for relief.   This usually relieves her pain for days at a time, she states.   She has Norco at home for relief.

## 2012-08-12 NOTE — Progress Notes (Signed)
  Subjective:    Patient ID: [name], female    DOB: 1956-03-18, 57 y.o.   MRN: [id number]  HPI  1.  FU for ED for hemorrhoids and UTI:  - Hemorhoids:  Patient had bleeding into the toilet bowl as well as blood-streaked stool about 2 weeks ago. She presented emergency department 2 to dizziness. She is provided a bolus of normal saline because of her lightheadedness. Since then she had several further days of light bleeding but has had no further bleeding for about a week. She was not given any prescription medications she states that the emergency department. She describes the bleeding is painless. No never had pain with her hemorrhoids nor history of thrombosis.  Was told by EDP she needs to FU with her GI for further evaluation of hemorrhoids and is asking for a referral.   - UTI:  Patient with symptoms of dysuria plus positive UA meter squared. She was treated with Cipro. This could his symptoms. She is doing fine since then without symptoms.  - Headaches:  Patient is a chronic migraines. She states she is still having minor pain as she continued to today. Describes as bifrontal pain. Some nausea and photophobia. Currently the pain is not quite as bad as it has been. She is concerned because the pain is worse at nighttime and is asking for a Toradol shot.  She also mentions that she has an upcoming knee replacement January 13.  Review of Systems See HPI above for review of systems.       Objective:   Physical Exam Gen:  Alert, cooperative patient who appears stated age in no acute distress.  Vital signs reviewed. HEENT:  [place]/AT, PERRL, MMM Heart:  RRR without murmur Lungs:  Minimal wheezing at bases. Abdomen soft nondistended and nontender.  Rectal: Patient deferred Extremities: No extremity edema       Assessment & Plan:

## 2012-08-13 NOTE — Pre-Procedure Instructions (Addendum)
20 [name]  08/13/2012   Your procedure is scheduled on: Monday, January 13 th   Report to [facility] at  7:30 AM.  Call this number if you have problems the morning of surgery: [phone]   Remember:   Do not eat food or drink any liquids:After Midnight Sunday.  Take these medicines the morning of surgery with A SIP OF WATER: Pressair, Albuterol nebs & Inhaler, Flonase, Hydrocodone, bentyl, lamictal topamax              Omeprazole   Do not wear jewelry, make-up or nail polish.  Do not wear lotions, powders, or perfumes. You may NOT wear deodorant.   LADIES--Do not shave 48 hours prior to surgery.    Do not bring valuables to the hospital.  Contacts, dentures or bridgework may not be worn into surgery.   Leave suitcase in the car. After surgery it may be brought to your room.  For patients admitted to the hospital, checkout time is 11:00 AM the day of discharge.   Patients discharged the day of surgery will not be allowed to drive home.   Name and phone number of your driver: lacy  [phone]   Special Instructions: Shower using CHG 2 nights before surgery and the night before surgery.  If you shower the day of surgery use CHG.  Use special wash - you have one bottle of CHG for all showers.  You should use approximately 1/3 of the bottle for each shower.   Please read over the following fact sheets that you were given: Pain Booklet, Coughing and Deep Breathing, Blood Transfusion Information, MRSA Information and Surgical Site Infection Prevention

## 2012-08-16 ENCOUNTER — Encounter (HOSPITAL_COMMUNITY)
Admission: RE | Admit: 2012-08-16 | Discharge: 2012-08-16 | Disposition: A | Discharge status: Home / Self Care | Payer: Medicaid Other | Source: Ambulatory Visit | Attending: Orthopedic Surgery | Admitting: Orthopedic Surgery

## 2012-08-16 ENCOUNTER — Encounter (HOSPITAL_COMMUNITY): Payer: Self-pay

## 2012-08-16 ENCOUNTER — Other Ambulatory Visit: Payer: Self-pay | Admitting: Physician Assistant

## 2012-08-16 HISTORY — DX: Malignant (primary) neoplasm, unspecified: C80.1

## 2012-08-16 HISTORY — DX: Urinary tract infection, site not specified: N39.0

## 2012-08-16 LAB — COMPREHENSIVE METABOLIC PANEL
AST: 10 U/L (ref 0–37)
Albumin: 3.3 g/dL — ABNORMAL LOW (ref 3.5–5.2)
Alkaline Phosphatase: 108 U/L (ref 39–117)
BUN: 17 mg/dL (ref 6–23)
CO2: 23 mEq/L (ref 19–32)
Chloride: 107 mEq/L (ref 96–112)
Creatinine, Ser: 0.78 mg/dL (ref 0.50–1.10)
GFR calc non Af Amer: >90 mL/min (ref >90)
Potassium: 4.5 mEq/L (ref 3.5–5.1)
Total Bilirubin: 0.1 mg/dL — ABNORMAL LOW (ref 0.3–1.2)

## 2012-08-16 LAB — CBC WITH DIFFERENTIAL/PLATELET
Basophils Relative: 0 % (ref 0–1)
HCT: 38 % (ref 36.0–46.0)
Hemoglobin: 12.5 g/dL (ref 12.0–15.0)
Lymphocytes Relative: 28 % (ref 12–46)
MCHC: 32.9 g/dL (ref 30.0–36.0)
Monocytes Absolute: 1.1 10*3/uL — ABNORMAL HIGH (ref 0.1–1.0)
Monocytes Relative: 11 % (ref 3–12)
Neutro Abs: 6.2 10*3/uL (ref 1.7–7.7)
Neutrophils Relative %: 60 % (ref 43–77)
RBC: 3.92 MIL/uL (ref 3.87–5.11)
WBC: 10.4 10*3/uL (ref 4.0–10.5)

## 2012-08-16 LAB — URINALYSIS, ROUTINE W REFLEX MICROSCOPIC
Glucose, UA: NEGATIVE mg/dL
Leukocytes, UA: NEGATIVE
Nitrite: NEGATIVE
Specific Gravity, Urine: 1.027 (ref 1.005–1.030)
pH: 5.5 (ref 5.0–8.0)

## 2012-08-16 LAB — SURGICAL PCR SCREEN: Staphylococcus aureus: NEGATIVE

## 2012-08-16 LAB — APTT: aPTT: 30 seconds (ref 24–37)

## 2012-08-16 LAB — TYPE AND SCREEN

## 2012-08-16 LAB — PROTIME-INR: Prothrombin Time: 11.7 seconds (ref 11.6–15.2)

## 2012-08-16 NOTE — Progress Notes (Signed)
Office called to have dx added to permit verbage

## 2012-08-17 LAB — URINE CULTURE: Colony Count: 70000

## 2012-08-20 NOTE — Progress Notes (Signed)
Patient notified to arrive at 05:30 verbalized understanding 

## 2012-08-22 MED ORDER — VANCOMYCIN HCL IN DEXTROSE 1-5 GM/200ML-% IV SOLN
1000.0000 mg | INTRAVENOUS | Status: AC
  Administered 2012-08-23: 1 mg via INTRAVENOUS
  Filled 2012-08-22: qty 200

## 2012-08-23 ENCOUNTER — Encounter (HOSPITAL_COMMUNITY): Payer: Self-pay | Admitting: General Practice

## 2012-08-23 ENCOUNTER — Encounter (HOSPITAL_COMMUNITY): Payer: Self-pay | Admitting: Anesthesiology

## 2012-08-23 ENCOUNTER — Inpatient Hospital Stay (HOSPITAL_COMMUNITY)
Admission: RE | Admit: 2012-08-23 | Discharge: 2012-09-06 | DRG: 469 | Disposition: A | Discharge status: Home Health | Payer: Medicaid Other | Source: Ambulatory Visit | Attending: Internal Medicine | Admitting: Internal Medicine

## 2012-08-23 ENCOUNTER — Ambulatory Visit (HOSPITAL_COMMUNITY): Payer: Medicaid Other | Admitting: Anesthesiology

## 2012-08-23 ENCOUNTER — Encounter (HOSPITAL_COMMUNITY)
Admission: RE | Disposition: A | Discharge status: Home Health | Payer: Self-pay | Source: Ambulatory Visit | Attending: Internal Medicine

## 2012-08-23 DIAGNOSIS — G43019 Migraine without aura, intractable, without status migrainosus: Secondary | ICD-10-CM | POA: Diagnosis present

## 2012-08-23 DIAGNOSIS — G4733 Obstructive sleep apnea (adult) (pediatric): Secondary | ICD-10-CM | POA: Diagnosis present

## 2012-08-23 DIAGNOSIS — M949 Disorder of cartilage, unspecified: Secondary | ICD-10-CM | POA: Diagnosis present

## 2012-08-23 DIAGNOSIS — IMO0002 Reserved for concepts with insufficient information to code with codable children: Secondary | ICD-10-CM

## 2012-08-23 DIAGNOSIS — M179 Osteoarthritis of knee, unspecified: Secondary | ICD-10-CM

## 2012-08-23 DIAGNOSIS — F410 Panic disorder [episodic paroxysmal anxiety] without agoraphobia: Secondary | ICD-10-CM | POA: Diagnosis present

## 2012-08-23 DIAGNOSIS — I10 Essential (primary) hypertension: Secondary | ICD-10-CM | POA: Diagnosis present

## 2012-08-23 DIAGNOSIS — K449 Diaphragmatic hernia without obstruction or gangrene: Secondary | ICD-10-CM | POA: Diagnosis present

## 2012-08-23 DIAGNOSIS — J438 Other emphysema: Secondary | ICD-10-CM | POA: Diagnosis present

## 2012-08-23 DIAGNOSIS — R0600 Dyspnea, unspecified: Secondary | ICD-10-CM

## 2012-08-23 DIAGNOSIS — M1710 Unilateral primary osteoarthritis, unspecified knee: Principal | ICD-10-CM | POA: Diagnosis present

## 2012-08-23 DIAGNOSIS — K649 Unspecified hemorrhoids: Secondary | ICD-10-CM

## 2012-08-23 DIAGNOSIS — J189 Pneumonia, unspecified organism: Secondary | ICD-10-CM | POA: Diagnosis not present

## 2012-08-23 DIAGNOSIS — R0902 Hypoxemia: Secondary | ICD-10-CM | POA: Diagnosis not present

## 2012-08-23 DIAGNOSIS — H409 Unspecified glaucoma: Secondary | ICD-10-CM | POA: Diagnosis present

## 2012-08-23 DIAGNOSIS — R609 Edema, unspecified: Secondary | ICD-10-CM

## 2012-08-23 DIAGNOSIS — J309 Allergic rhinitis, unspecified: Secondary | ICD-10-CM

## 2012-08-23 DIAGNOSIS — K219 Gastro-esophageal reflux disease without esophagitis: Secondary | ICD-10-CM | POA: Diagnosis present

## 2012-08-23 DIAGNOSIS — M899 Disorder of bone, unspecified: Secondary | ICD-10-CM | POA: Diagnosis present

## 2012-08-23 DIAGNOSIS — E782 Mixed hyperlipidemia: Secondary | ICD-10-CM | POA: Diagnosis present

## 2012-08-23 DIAGNOSIS — M25512 Pain in left shoulder: Secondary | ICD-10-CM

## 2012-08-23 DIAGNOSIS — N393 Stress incontinence (female) (male): Secondary | ICD-10-CM

## 2012-08-23 DIAGNOSIS — R51 Headache: Secondary | ICD-10-CM

## 2012-08-23 DIAGNOSIS — E559 Vitamin D deficiency, unspecified: Secondary | ICD-10-CM | POA: Diagnosis present

## 2012-08-23 DIAGNOSIS — K589 Irritable bowel syndrome without diarrhea: Secondary | ICD-10-CM | POA: Diagnosis present

## 2012-08-23 DIAGNOSIS — Z8701 Personal history of pneumonia (recurrent): Secondary | ICD-10-CM

## 2012-08-23 DIAGNOSIS — Z01812 Encounter for preprocedural laboratory examination: Secondary | ICD-10-CM

## 2012-08-23 DIAGNOSIS — G40909 Epilepsy, unspecified, not intractable, without status epilepticus: Secondary | ICD-10-CM | POA: Diagnosis present

## 2012-08-23 DIAGNOSIS — J4489 Other specified chronic obstructive pulmonary disease: Secondary | ICD-10-CM

## 2012-08-23 DIAGNOSIS — J329 Chronic sinusitis, unspecified: Secondary | ICD-10-CM

## 2012-08-23 DIAGNOSIS — Y95 Nosocomial condition: Secondary | ICD-10-CM

## 2012-08-23 DIAGNOSIS — F411 Generalized anxiety disorder: Secondary | ICD-10-CM | POA: Diagnosis present

## 2012-08-23 DIAGNOSIS — E669 Obesity, unspecified: Secondary | ICD-10-CM | POA: Diagnosis present

## 2012-08-23 DIAGNOSIS — M1711 Unilateral primary osteoarthritis, right knee: Secondary | ICD-10-CM | POA: Diagnosis present

## 2012-08-23 DIAGNOSIS — R569 Unspecified convulsions: Secondary | ICD-10-CM | POA: Diagnosis present

## 2012-08-23 DIAGNOSIS — Z86718 Personal history of other venous thrombosis and embolism: Secondary | ICD-10-CM

## 2012-08-23 DIAGNOSIS — E119 Type 2 diabetes mellitus without complications: Secondary | ICD-10-CM | POA: Diagnosis present

## 2012-08-23 DIAGNOSIS — F17200 Nicotine dependence, unspecified, uncomplicated: Secondary | ICD-10-CM

## 2012-08-23 DIAGNOSIS — E785 Hyperlipidemia, unspecified: Secondary | ICD-10-CM

## 2012-08-23 DIAGNOSIS — D62 Acute posthemorrhagic anemia: Secondary | ICD-10-CM

## 2012-08-23 DIAGNOSIS — Z01818 Encounter for other preprocedural examination: Secondary | ICD-10-CM

## 2012-08-23 DIAGNOSIS — J449 Chronic obstructive pulmonary disease, unspecified: Secondary | ICD-10-CM | POA: Diagnosis present

## 2012-08-23 DIAGNOSIS — R918 Other nonspecific abnormal finding of lung field: Secondary | ICD-10-CM

## 2012-08-23 DIAGNOSIS — Z780 Asymptomatic menopausal state: Secondary | ICD-10-CM

## 2012-08-23 DIAGNOSIS — E662 Morbid (severe) obesity with alveolar hypoventilation: Secondary | ICD-10-CM | POA: Diagnosis present

## 2012-08-23 DIAGNOSIS — Z79899 Other long term (current) drug therapy: Secondary | ICD-10-CM

## 2012-08-23 HISTORY — PX: TOTAL KNEE ARTHROPLASTY: SHX125

## 2012-08-23 LAB — URINALYSIS, ROUTINE W REFLEX MICROSCOPIC
Glucose, UA: NEGATIVE mg/dL
Hgb urine dipstick: NEGATIVE
Ketones, ur: NEGATIVE mg/dL
Protein, ur: NEGATIVE mg/dL
Urobilinogen, UA: 0.2 mg/dL (ref 0.0–1.0)

## 2012-08-23 LAB — CBC
HCT: 34.5 % — ABNORMAL LOW (ref 36.0–46.0)
Hemoglobin: 11.1 g/dL — ABNORMAL LOW (ref 12.0–15.0)
MCHC: 32.2 g/dL (ref 30.0–36.0)
RBC: 3.58 MIL/uL — ABNORMAL LOW (ref 3.87–5.11)
WBC: 14.1 10*3/uL — ABNORMAL HIGH (ref 4.0–10.5)

## 2012-08-23 LAB — CREATININE, SERUM
Creatinine, Ser: 0.67 mg/dL (ref 0.50–1.10)
GFR calc Af Amer: >90 mL/min (ref >90)
GFR calc non Af Amer: >90 mL/min (ref >90)

## 2012-08-23 LAB — GLUCOSE, CAPILLARY: Glucose-Capillary: 148 mg/dL — ABNORMAL HIGH (ref 70–99)

## 2012-08-23 LAB — HEMOGLOBIN A1C: Hgb A1c MFr Bld: 5.6 % (ref <5.7)

## 2012-08-23 SURGERY — ARTHROPLASTY, KNEE, TOTAL
Anesthesia: General | Site: Knee | Laterality: Right | Wound class: Clean

## 2012-08-23 MED ORDER — ONDANSETRON HCL 4 MG/2ML IJ SOLN
INTRAMUSCULAR | Status: DC | PRN
  Administered 2012-08-23: 4 mg via INTRAVENOUS

## 2012-08-23 MED ORDER — PROPOFOL 10 MG/ML IV BOLUS
INTRAVENOUS | Status: DC | PRN
  Administered 2012-08-23: 200 mg via INTRAVENOUS

## 2012-08-23 MED ORDER — ALUM & MAG HYDROXIDE-SIMETH 200-200-20 MG/5ML PO SUSP: 30.0000 mL | ORAL | Status: DC | PRN

## 2012-08-23 MED ORDER — ACLIDINIUM BROMIDE 400 MCG/ACT IN AEPB
1.0000 | INHALATION_SPRAY | Freq: Two times a day (BID) | RESPIRATORY_TRACT | Status: DC
  Filled 2012-08-23: qty 1

## 2012-08-23 MED ORDER — ACETAMINOPHEN 650 MG RE SUPP: 650.0000 mg | Freq: Four times a day (QID) | RECTAL | Status: DC | PRN

## 2012-08-23 MED ORDER — BUPIVACAINE-EPINEPHRINE 0.25% -1:200000 IJ SOLN
INTRAMUSCULAR | Status: DC | PRN
  Administered 2012-08-23: 15 mL

## 2012-08-23 MED ORDER — ARTIFICIAL TEARS OP OINT
TOPICAL_OINTMENT | OPHTHALMIC | Status: DC | PRN
  Administered 2012-08-23: 1 via OPHTHALMIC

## 2012-08-23 MED ORDER — HYDROMORPHONE HCL PF 1 MG/ML IJ SOLN
0.5000 mg | INTRAMUSCULAR | Status: DC | PRN
  Administered 2012-08-23 – 2012-09-01 (×10): 1 mg via INTRAVENOUS
  Filled 2012-08-23 (×10): qty 1

## 2012-08-23 MED ORDER — INSULIN ASPART 100 UNIT/ML ~~LOC~~ SOLN
0.0000 [IU] | Freq: Three times a day (TID) | SUBCUTANEOUS | Status: DC
  Administered 2012-08-23: 2 [IU] via SUBCUTANEOUS
  Administered 2012-08-24 – 2012-08-25 (×2): 3 [IU] via SUBCUTANEOUS
  Administered 2012-08-26: 5 [IU] via SUBCUTANEOUS
  Administered 2012-08-27 (×3): 2 [IU] via SUBCUTANEOUS
  Administered 2012-08-28: 3 [IU] via SUBCUTANEOUS
  Administered 2012-08-29: 2 [IU] via SUBCUTANEOUS
  Administered 2012-08-31 (×3): 3 [IU] via SUBCUTANEOUS
  Administered 2012-09-01: 2 [IU] via SUBCUTANEOUS
  Administered 2012-09-01: 3 [IU] via SUBCUTANEOUS
  Administered 2012-09-02: 2 [IU] via SUBCUTANEOUS

## 2012-08-23 MED ORDER — MIDAZOLAM HCL 5 MG/5ML IJ SOLN
INTRAMUSCULAR | Status: DC | PRN
  Administered 2012-08-23: 2 mg via INTRAVENOUS

## 2012-08-23 MED ORDER — BUPIVACAINE-EPINEPHRINE PF 0.5-1:200000 % IJ SOLN
INTRAMUSCULAR | Status: DC | PRN
  Administered 2012-08-23: 30 mL

## 2012-08-23 MED ORDER — HYDROMORPHONE HCL PF 1 MG/ML IJ SOLN
INTRAMUSCULAR | Status: AC
  Filled 2012-08-23: qty 2

## 2012-08-23 MED ORDER — HYDROCORTISONE ACETATE 25 MG RE SUPP
25.0000 mg | Freq: Two times a day (BID) | RECTAL | Status: DC | PRN
  Filled 2012-08-23: qty 1

## 2012-08-23 MED ORDER — TOBRAMYCIN SULFATE 1.2 G IJ SOLR
INTRAMUSCULAR | Status: DC | PRN
  Administered 2012-08-23: 1.2 g

## 2012-08-23 MED ORDER — PANTOPRAZOLE SODIUM 40 MG PO TBEC
80.0000 mg | DELAYED_RELEASE_TABLET | Freq: Every day | ORAL | Status: DC
  Administered 2012-08-23 – 2012-09-06 (×15): 80 mg via ORAL
  Filled 2012-08-23 (×4): qty 2
  Filled 2012-08-23: qty 1
  Filled 2012-08-23 (×5): qty 2
  Filled 2012-08-23: qty 1
  Filled 2012-08-23: qty 2
  Filled 2012-08-23: qty 1
  Filled 2012-08-23 (×3): qty 2

## 2012-08-23 MED ORDER — ACETAMINOPHEN 10 MG/ML IV SOLN
INTRAVENOUS | Status: AC
  Filled 2012-08-23: qty 100

## 2012-08-23 MED ORDER — VITAMIN C 500 MG PO TABS
500.0000 mg | ORAL_TABLET | Freq: Every day | ORAL | Status: DC
  Administered 2012-08-23 – 2012-09-06 (×15): 500 mg via ORAL
  Filled 2012-08-23 (×15): qty 1

## 2012-08-23 MED ORDER — INSULIN ASPART 100 UNIT/ML ~~LOC~~ SOLN: 0.0000 [IU] | Freq: Every day | SUBCUTANEOUS | Status: DC

## 2012-08-23 MED ORDER — VECURONIUM BROMIDE 10 MG IV SOLR
INTRAVENOUS | Status: DC | PRN
  Administered 2012-08-23: 8 mg via INTRAVENOUS

## 2012-08-23 MED ORDER — OXYCODONE HCL 5 MG/5ML PO SOLN: 5.0000 mg | Freq: Once | ORAL | Status: DC | PRN

## 2012-08-23 MED ORDER — LACTATED RINGERS IV SOLN: INTRAVENOUS | Status: DC

## 2012-08-23 MED ORDER — CHLORHEXIDINE GLUCONATE 4 % EX LIQD: 60.0000 mL | Freq: Once | CUTANEOUS | Status: DC

## 2012-08-23 MED ORDER — ACETAMINOPHEN 10 MG/ML IV SOLN
1000.0000 mg | Freq: Four times a day (QID) | INTRAVENOUS | Status: AC
  Administered 2012-08-23 – 2012-08-24 (×4): 1000 mg via INTRAVENOUS
  Filled 2012-08-23 (×4): qty 100

## 2012-08-23 MED ORDER — BISACODYL 5 MG PO TBEC
10.0000 mg | DELAYED_RELEASE_TABLET | Freq: Every day | ORAL | Status: DC
  Administered 2012-08-23 – 2012-09-01 (×10): 10 mg via ORAL
  Filled 2012-08-23 (×10): qty 2

## 2012-08-23 MED ORDER — PHENOL 1.4 % MT LIQD
1.0000 | OROMUCOSAL | Status: DC | PRN
  Administered 2012-08-26: 1 via OROMUCOSAL
  Filled 2012-08-23: qty 177

## 2012-08-23 MED ORDER — BUPIVACAINE-EPINEPHRINE 0.25% -1:200000 IJ SOLN
INTRAMUSCULAR | Status: AC
  Filled 2012-08-23: qty 1

## 2012-08-23 MED ORDER — FLUTICASONE PROPIONATE 50 MCG/ACT NA SUSP
2.0000 | Freq: Every day | NASAL | Status: DC
  Administered 2012-08-23 – 2012-09-06 (×14): 2 via NASAL
  Filled 2012-08-23 (×2): qty 16

## 2012-08-23 MED ORDER — PROMETHAZINE HCL 25 MG/ML IJ SOLN: 6.2500 mg | INTRAMUSCULAR | Status: DC | PRN

## 2012-08-23 MED ORDER — TOBRAMYCIN SULFATE 1.2 G IJ SOLR
INTRAMUSCULAR | Status: AC
  Filled 2012-08-23: qty 1.2

## 2012-08-23 MED ORDER — DEXAMETHASONE 6 MG PO TABS
10.0000 mg | ORAL_TABLET | Freq: Three times a day (TID) | ORAL | Status: AC
  Administered 2012-08-23 – 2012-08-24 (×3): 10 mg via ORAL
  Filled 2012-08-23 (×3): qty 1

## 2012-08-23 MED ORDER — FENTANYL CITRATE 0.05 MG/ML IJ SOLN
INTRAMUSCULAR | Status: DC | PRN
  Administered 2012-08-23: 50 ug via INTRAVENOUS
  Administered 2012-08-23: 200 ug via INTRAVENOUS

## 2012-08-23 MED ORDER — OXYCODONE HCL 5 MG PO TABS: 5.0000 mg | ORAL_TABLET | Freq: Once | ORAL | Status: DC | PRN

## 2012-08-23 MED ORDER — METOCLOPRAMIDE HCL 10 MG PO TABS
5.0000 mg | ORAL_TABLET | Freq: Three times a day (TID) | ORAL | Status: DC | PRN
  Filled 2012-08-23: qty 1

## 2012-08-23 MED ORDER — CHLORHEXIDINE GLUCONATE 4 % EX LIQD
60.0000 mL | Freq: Once | CUTANEOUS | Status: DC
Start: 2012-08-23 — End: 2012-08-23

## 2012-08-23 MED ORDER — MEPERIDINE HCL 25 MG/ML IJ SOLN: 6.2500 mg | INTRAMUSCULAR | Status: DC | PRN

## 2012-08-23 MED ORDER — ONDANSETRON HCL 4 MG/2ML IJ SOLN: 4.0000 mg | Freq: Four times a day (QID) | INTRAMUSCULAR | Status: DC | PRN

## 2012-08-23 MED ORDER — DIPHENHYDRAMINE HCL 12.5 MG/5ML PO ELIX
12.5000 mg | ORAL_SOLUTION | ORAL | Status: DC | PRN
  Filled 2012-08-23: qty 10

## 2012-08-23 MED ORDER — LAMOTRIGINE 150 MG PO TABS
150.0000 mg | ORAL_TABLET | Freq: Two times a day (BID) | ORAL | Status: DC
  Administered 2012-08-23 – 2012-09-03 (×23): 150 mg via ORAL
  Filled 2012-08-23 (×24): qty 1

## 2012-08-23 MED ORDER — ENOXAPARIN SODIUM 30 MG/0.3ML ~~LOC~~ SOLN
30.0000 mg | Freq: Two times a day (BID) | SUBCUTANEOUS | Status: DC
  Administered 2012-08-24 – 2012-09-03 (×21): 30 mg via SUBCUTANEOUS
  Filled 2012-08-23 (×25): qty 0.3

## 2012-08-23 MED ORDER — OXYCODONE HCL 5 MG PO TABS
5.0000 mg | ORAL_TABLET | ORAL | Status: DC | PRN
  Administered 2012-08-23 – 2012-08-30 (×23): 10 mg via ORAL
  Administered 2012-08-31 (×2): 5 mg via ORAL
  Administered 2012-09-01 – 2012-09-02 (×2): 10 mg via ORAL
  Administered 2012-09-02 – 2012-09-03 (×2): 5 mg via ORAL
  Administered 2012-09-03 – 2012-09-04 (×3): 10 mg via ORAL
  Administered 2012-09-04: 5 mg via ORAL
  Administered 2012-09-04 – 2012-09-05 (×2): 10 mg via ORAL
  Administered 2012-09-05 (×2): 5 mg via ORAL
  Administered 2012-09-06: 10 mg via ORAL
  Filled 2012-08-23 (×3): qty 2
  Filled 2012-08-23: qty 1
  Filled 2012-08-23 (×6): qty 2
  Filled 2012-08-23: qty 1
  Filled 2012-08-23 (×6): qty 2
  Filled 2012-08-23 (×3): qty 1
  Filled 2012-08-23 (×8): qty 2
  Filled 2012-08-23: qty 1
  Filled 2012-08-23 (×2): qty 2
  Filled 2012-08-23: qty 1
  Filled 2012-08-23 (×4): qty 2
  Filled 2012-08-23: qty 1
  Filled 2012-08-23: qty 2

## 2012-08-23 MED ORDER — SUCCINYLCHOLINE CHLORIDE 20 MG/ML IJ SOLN
INTRAMUSCULAR | Status: DC | PRN
  Administered 2012-08-23: 100 mg via INTRAVENOUS

## 2012-08-23 MED ORDER — DOCUSATE SODIUM 100 MG PO CAPS
100.0000 mg | ORAL_CAPSULE | Freq: Two times a day (BID) | ORAL | Status: DC
  Administered 2012-08-23 – 2012-09-06 (×22): 100 mg via ORAL
  Filled 2012-08-23 (×31): qty 1

## 2012-08-23 MED ORDER — DEXAMETHASONE SODIUM PHOSPHATE 10 MG/ML IJ SOLN
10.0000 mg | Freq: Once | INTRAMUSCULAR | Status: AC
  Administered 2012-08-23: 10 mg via INTRAVENOUS

## 2012-08-23 MED ORDER — GLYCOPYRROLATE 0.2 MG/ML IJ SOLN
INTRAMUSCULAR | Status: DC | PRN
  Administered 2012-08-23: 0.6 mg via INTRAVENOUS

## 2012-08-23 MED ORDER — DEXAMETHASONE SODIUM PHOSPHATE 10 MG/ML IJ SOLN
10.0000 mg | Freq: Three times a day (TID) | INTRAMUSCULAR | Status: AC
  Filled 2012-08-23 (×3): qty 1

## 2012-08-23 MED ORDER — SIMVASTATIN 40 MG PO TABS
40.0000 mg | ORAL_TABLET | Freq: Every day | ORAL | Status: DC
  Administered 2012-08-23 – 2012-08-29 (×7): 40 mg via ORAL
  Filled 2012-08-23 (×8): qty 1

## 2012-08-23 MED ORDER — PNEUMOCOCCAL VAC POLYVALENT 25 MCG/0.5ML IJ INJ
0.5000 mL | INJECTION | INTRAMUSCULAR | Status: AC
  Filled 2012-08-23: qty 0.5

## 2012-08-23 MED ORDER — QUETIAPINE FUMARATE 50 MG PO TABS
50.0000 mg | ORAL_TABLET | Freq: Every day | ORAL | Status: DC
  Administered 2012-08-23 – 2012-09-05 (×14): 50 mg via ORAL
  Filled 2012-08-23 (×17): qty 1

## 2012-08-23 MED ORDER — DIAZEPAM 5 MG PO TABS
5.0000 mg | ORAL_TABLET | Freq: Four times a day (QID) | ORAL | Status: DC | PRN
  Administered 2012-08-23 – 2012-09-03 (×8): 5 mg via ORAL
  Filled 2012-08-23 (×8): qty 1

## 2012-08-23 MED ORDER — LIDOCAINE HCL (CARDIAC) 20 MG/ML IV SOLN
INTRAVENOUS | Status: DC | PRN
  Administered 2012-08-23: 80 mg via INTRAVENOUS

## 2012-08-23 MED ORDER — ACETAMINOPHEN 10 MG/ML IV SOLN
INTRAVENOUS | Status: DC | PRN
  Administered 2012-08-23: 1000 mg via INTRAVENOUS

## 2012-08-23 MED ORDER — SODIUM CHLORIDE 0.9 % IR SOLN
Status: DC | PRN
  Administered 2012-08-23: 3000 mL
  Administered 2012-08-23: 1000 mL

## 2012-08-23 MED ORDER — POTASSIUM CHLORIDE IN NACL 20-0.9 MEQ/L-% IV SOLN
1000.0000 mL | INTRAVENOUS | Status: DC
  Administered 2012-08-23 – 2012-08-24 (×2): 1000 mL via INTRAVENOUS
  Filled 2012-08-23 (×13): qty 1000

## 2012-08-23 MED ORDER — POVIDONE-IODINE 7.5 % EX SOLN
Freq: Once | CUTANEOUS | Status: DC
  Filled 2012-08-23: qty 118

## 2012-08-23 MED ORDER — MENTHOL 3 MG MT LOZG: 1.0000 | LOZENGE | OROMUCOSAL | Status: DC | PRN

## 2012-08-23 MED ORDER — DICYCLOMINE HCL 20 MG PO TABS
20.0000 mg | ORAL_TABLET | Freq: Three times a day (TID) | ORAL | Status: DC
  Administered 2012-08-23 – 2012-09-06 (×54): 20 mg via ORAL
  Filled 2012-08-23 (×63): qty 1

## 2012-08-23 MED ORDER — VITAMIN D3 50 MCG (2000 UT) PO TABS
1.0000 | ORAL_TABLET | Freq: Every day | ORAL | Status: DC
  Administered 2012-08-24 – 2012-08-25 (×2): 1000 [IU] via ORAL
  Filled 2012-08-23 (×4): qty 1

## 2012-08-23 MED ORDER — HYDROXYZINE HCL 25 MG PO TABS
25.0000 mg | ORAL_TABLET | Freq: Four times a day (QID) | ORAL | Status: DC | PRN
Start: 2012-08-23 — End: 2012-09-03
  Filled 2012-08-23: qty 1

## 2012-08-23 MED ORDER — NEOSTIGMINE METHYLSULFATE 1 MG/ML IJ SOLN
INTRAMUSCULAR | Status: DC | PRN
  Administered 2012-08-23: 3 mg via INTRAVENOUS

## 2012-08-23 MED ORDER — HYDROMORPHONE HCL PF 1 MG/ML IJ SOLN
0.2500 mg | INTRAMUSCULAR | Status: DC | PRN
  Administered 2012-08-23 (×3): 0.5 mg via INTRAVENOUS

## 2012-08-23 MED ORDER — ACETAMINOPHEN 325 MG PO TABS
650.0000 mg | ORAL_TABLET | Freq: Four times a day (QID) | ORAL | Status: DC | PRN
  Administered 2012-08-25 – 2012-09-05 (×14): 650 mg via ORAL
  Filled 2012-08-23 (×14): qty 2

## 2012-08-23 MED ORDER — ONDANSETRON HCL 4 MG PO TABS: 4.0000 mg | ORAL_TABLET | Freq: Four times a day (QID) | ORAL | Status: DC | PRN

## 2012-08-23 MED ORDER — VANCOMYCIN HCL IN DEXTROSE 1-5 GM/200ML-% IV SOLN
1000.0000 mg | Freq: Two times a day (BID) | INTRAVENOUS | Status: AC
  Administered 2012-08-23: 1000 mg via INTRAVENOUS
  Filled 2012-08-23: qty 200

## 2012-08-23 MED ORDER — METOCLOPRAMIDE HCL 5 MG/ML IJ SOLN
5.0000 mg | Freq: Three times a day (TID) | INTRAMUSCULAR | Status: DC | PRN
  Filled 2012-08-23: qty 2

## 2012-08-23 MED ORDER — LACTATED RINGERS IV SOLN
INTRAVENOUS | Status: DC | PRN
  Administered 2012-08-23 (×2): via INTRAVENOUS

## 2012-08-23 MED ORDER — LORATADINE 10 MG PO TABS
10.0000 mg | ORAL_TABLET | ORAL | Status: DC | PRN
  Filled 2012-08-23: qty 1

## 2012-08-23 MED ORDER — TOPIRAMATE 100 MG PO TABS
100.0000 mg | ORAL_TABLET | Freq: Two times a day (BID) | ORAL | Status: DC
  Administered 2012-08-23 – 2012-09-06 (×28): 100 mg via ORAL
  Filled 2012-08-23 (×31): qty 1

## 2012-08-23 MED ORDER — MORPHINE SULFATE 10 MG/ML IJ SOLN
INTRAMUSCULAR | Status: DC | PRN
  Administered 2012-08-23 (×2): 2 mg via INTRAVENOUS

## 2012-08-23 MED ORDER — ALBUTEROL SULFATE HFA 108 (90 BASE) MCG/ACT IN AERS
INHALATION_SPRAY | RESPIRATORY_TRACT | Status: DC | PRN
  Administered 2012-08-23: 2 via RESPIRATORY_TRACT

## 2012-08-23 MED ORDER — ALBUTEROL SULFATE (5 MG/ML) 0.5% IN NEBU
2.5000 mg | INHALATION_SOLUTION | Freq: Four times a day (QID) | RESPIRATORY_TRACT | Status: DC
  Administered 2012-08-23 (×2): 2.5 mg via RESPIRATORY_TRACT
  Filled 2012-08-23 (×2): qty 0.5

## 2012-08-23 MED ORDER — ZOLPIDEM TARTRATE 5 MG PO TABS: 5.0000 mg | ORAL_TABLET | Freq: Every evening | ORAL | Status: DC | PRN

## 2012-08-23 MED ORDER — HYDROCORTISONE 1 % EX CREA
TOPICAL_CREAM | Freq: Two times a day (BID) | CUTANEOUS | Status: DC | PRN
  Filled 2012-08-23: qty 28

## 2012-08-23 SURGICAL SUPPLY — 79 items
BANDAGE ESMARK 6X9 LF (GAUZE/BANDAGES/DRESSINGS) ×1 IMPLANT
BLADE SAGITTAL 25.0X1.19X90 (BLADE) ×2 IMPLANT
BLADE SAW SGTL 11.0X1.19X90.0M (BLADE) IMPLANT
BLADE SAW SGTL 13.0X1.19X90.0M (BLADE) ×2 IMPLANT
BLADE SURG 10 STRL SS (BLADE) ×6 IMPLANT
BNDG CMPR 9X6 STRL LF SNTH (GAUZE/BANDAGES/DRESSINGS) ×1
BNDG CMPR MED 15X6 ELC VLCR LF (GAUZE/BANDAGES/DRESSINGS) ×1
BNDG ELASTIC 6X15 VLCR STRL LF (GAUZE/BANDAGES/DRESSINGS) ×2 IMPLANT
BNDG ESMARK 6X9 LF (GAUZE/BANDAGES/DRESSINGS) ×2
BOWL SMART MIX CTS (DISPOSABLE) ×2 IMPLANT
CEMENT HV SMART SET (Cement) ×4 IMPLANT
CLOTH BEACON ORANGE TIMEOUT ST (SAFETY) ×2 IMPLANT
COVER BACK TABLE 24X17X13 BIG (DRAPES) IMPLANT
COVER PROBE W GEL 5X96 (DRAPES) ×1 IMPLANT
COVER SURGICAL LIGHT HANDLE (MISCELLANEOUS) ×2 IMPLANT
CUFF TOURNIQUET SINGLE 34IN LL (TOURNIQUET CUFF) ×2 IMPLANT
CUFF TOURNIQUET SINGLE 44IN (TOURNIQUET CUFF) IMPLANT
DRAPE EXTREMITY BILATERAL (DRAPE) IMPLANT
DRAPE EXTREMITY T 121X128X90 (DRAPE) ×2 IMPLANT
DRAPE INCISE IOBAN 66X45 STRL (DRAPES) ×2 IMPLANT
DRAPE PROXIMA HALF (DRAPES) ×2 IMPLANT
DRAPE U-SHAPE 47X51 STRL (DRAPES) ×2 IMPLANT
DRSG ADAPTIC 3X8 NADH LF (GAUZE/BANDAGES/DRESSINGS) ×2 IMPLANT
DRSG PAD ABDOMINAL 8X10 ST (GAUZE/BANDAGES/DRESSINGS) ×4 IMPLANT
DURAPREP 26ML APPLICATOR (WOUND CARE) ×2 IMPLANT
ELECT CAUTERY BLADE 6.4 (BLADE) ×2 IMPLANT
ELECT REM PT RETURN 9FT ADLT (ELECTROSURGICAL) ×2
ELECTRODE REM PT RTRN 9FT ADLT (ELECTROSURGICAL) ×1 IMPLANT
EVACUATOR 1/8 PVC DRAIN (DRAIN) ×1 IMPLANT
FACESHIELD LNG OPTICON STERILE (SAFETY) ×2 IMPLANT
GLOVE BIO SURGEON STRL SZ7 (GLOVE) ×2 IMPLANT
GLOVE BIOGEL PI IND STRL 7.0 (GLOVE) ×1 IMPLANT
GLOVE BIOGEL PI IND STRL 7.5 (GLOVE) ×1 IMPLANT
GLOVE BIOGEL PI INDICATOR 7.0 (GLOVE) ×2
GLOVE BIOGEL PI INDICATOR 7.5 (GLOVE) ×1
GLOVE BIOGEL PI ORTHO PRO SZ7 (GLOVE) ×1
GLOVE ECLIPSE 7.0 STRL STRAW (GLOVE) ×1 IMPLANT
GLOVE ECLIPSE 7.5 STRL STRAW (GLOVE) ×1 IMPLANT
GLOVE PI ORTHO PRO STRL SZ7 (GLOVE) IMPLANT
GLOVE SS BIOGEL STRL SZ 7.5 (GLOVE) ×1 IMPLANT
GLOVE SUPERSENSE BIOGEL SZ 7.5 (GLOVE) ×1
GLOVE SURG ORTHO 7.0 STRL STRW (GLOVE) ×1 IMPLANT
GLOVE SURG SS PI 7.0 STRL IVOR (GLOVE) ×1 IMPLANT
GOWN PREVENTION PLUS LG XLONG (DISPOSABLE) ×1 IMPLANT
GOWN PREVENTION PLUS XLARGE (GOWN DISPOSABLE) ×4 IMPLANT
GOWN STRL NON-REIN LRG LVL3 (GOWN DISPOSABLE) ×2 IMPLANT
GOWN STRL REIN XL XLG (GOWN DISPOSABLE) ×1 IMPLANT
HANDPIECE INTERPULSE COAX TIP (DISPOSABLE) ×2
HOOD PEEL AWAY FACE SHEILD DIS (HOOD) ×5 IMPLANT
IMMOBILIZER KNEE 22 UNIV (SOFTGOODS) IMPLANT
INSERT CUSHION PRONEVIEW LG (MISCELLANEOUS) ×2 IMPLANT
KIT BASIN OR (CUSTOM PROCEDURE TRAY) ×2 IMPLANT
KIT ROOM TURNOVER OR (KITS) ×2 IMPLANT
MANIFOLD NEPTUNE II (INSTRUMENTS) ×2 IMPLANT
NS IRRIG 1000ML POUR BTL (IV SOLUTION) ×2 IMPLANT
PACK TOTAL JOINT (CUSTOM PROCEDURE TRAY) ×2 IMPLANT
PAD ARMBOARD 7.5X6 YLW CONV (MISCELLANEOUS) ×4 IMPLANT
PAD CAST 4YDX4 CTTN HI CHSV (CAST SUPPLIES) ×1 IMPLANT
PADDING CAST COTTON 4X4 STRL (CAST SUPPLIES) ×2
PADDING CAST COTTON 6X4 STRL (CAST SUPPLIES) ×2 IMPLANT
POSITIONER HEAD PRONE TRACH (MISCELLANEOUS) ×2 IMPLANT
RUBBERBAND STERILE (MISCELLANEOUS) ×2 IMPLANT
SET HNDPC FAN SPRY TIP SCT (DISPOSABLE) ×1 IMPLANT
SPONGE GAUZE 4X4 12PLY (GAUZE/BANDAGES/DRESSINGS) ×2 IMPLANT
STRIP CLOSURE SKIN 1/2X4 (GAUZE/BANDAGES/DRESSINGS) ×2 IMPLANT
SUCTION FRAZIER TIP 10 FR DISP (SUCTIONS) ×2 IMPLANT
SUT ETHIBOND NAB CT1 #1 30IN (SUTURE) ×4 IMPLANT
SUT MNCRL AB 3-0 PS2 18 (SUTURE) ×3 IMPLANT
SUT VIC AB 0 CT1 27 (SUTURE) ×4
SUT VIC AB 0 CT1 27XBRD ANBCTR (SUTURE) ×2 IMPLANT
SUT VIC AB 2-0 CT1 27 (SUTURE) ×4
SUT VIC AB 2-0 CT1 TAPERPNT 27 (SUTURE) ×2 IMPLANT
SYR 30ML SLIP (SYRINGE) ×2 IMPLANT
TOWEL OR 17X24 6PK STRL BLUE (TOWEL DISPOSABLE) ×2 IMPLANT
TOWEL OR 17X26 10 PK STRL BLUE (TOWEL DISPOSABLE) ×2 IMPLANT
TRAY FOLEY CATH 14FR (SET/KITS/TRAYS/PACK) ×2 IMPLANT
TUBE CONNECTING 12X1/4 (SUCTIONS) ×1 IMPLANT
WATER STERILE IRR 1000ML POUR (IV SOLUTION) ×6 IMPLANT
YANKAUER SUCT BULB TIP NO VENT (SUCTIONS) ×1 IMPLANT

## 2012-08-23 NOTE — Transfer of Care (Signed)
Immediate Anesthesia Transfer of Care Note  Patient: [name]  Procedure(s) Performed: Procedure(s) (LRB) with comments: TOTAL KNEE ARTHROPLASTY (Right) - RIGHT KNEE ARTHROPLASTY MEDIAL AND LATERAL COMPARTMENTS WITH PATELLA RESURFACING  Patient Location: PACU  Anesthesia Type:General  Level of Consciousness: sedated  Airway & Oxygen Therapy: Patient Spontanous Breathing and Patient connected to nasal cannula oxygen  Post-op Assessment: Report given to PACU RN, Post -op Vital signs reviewed and stable and Patient moving all extremities X 4  Post vital signs: Reviewed and stable  Complications: No apparent anesthesia complications

## 2012-08-23 NOTE — Interval H&P Note (Signed)
History and Physical Interval Note:  08/23/2012 7:06 AM  [name]  has presented today for surgery, with the diagnosis of Right knee osteoarthritis  The various methods of treatment have been discussed with the patient and family. After consideration of risks, benefits and other options for treatment, the patient has consented to  Procedure(s) (LRB) with comments: TOTAL KNEE ARTHROPLASTY (Right) - RIGHT KNEE ARTHROPLASTY MEDIAL AND LATERAL COMPARTMENTS WITH PATELLA RESURFACING as a surgical intervention .  The patient's history has been reviewed, patient examined, no change in status, stable for surgery.  I have reviewed the patient's chart and labs.  Questions were answered to the patient's satisfaction.     Salvatore Marvel A

## 2012-08-23 NOTE — H&P (View-Only) (Signed)
TOTAL KNEE ADMISSION H&P  Patient is being admitted for right total knee arthroplasty.  Subjective:  Chief Complaint:right knee pain.  HPI: [name], 56 y.o. female, has a history of pain and functional disability in the right knee due to arthritis and has failed non-surgical conservative treatments for greater than 12 weeks to includeNSAID's and/or analgesics, corticosteriod injections, flexibility and strengthening excercises, supervised PT with diminished ADL's post treatment, use of assistive devices, weight reduction as appropriate and activity modification.  Onset of symptoms was gradual, starting 2 years ago with gradually worsening course since that time. The patient noted prior procedures on the knee to include  arthroscopy on the right knee(s).  Patient currently rates pain in the right knee(s) at 8 out of 10 with activity. Patient has night pain, worsening of pain with activity and weight bearing, pain that interferes with activities of daily living, crepitus and joint swelling.  Patient has evidence of subchondral sclerosis, periarticular osteophytes and joint space narrowing by imaging studies.  There is no active infection.  Patient Active Problem List   Diagnosis Date Noted  . Pre-op evaluation 06/27/2012  . Left shoulder pain 06/27/2012  . Headache 05/09/2012  . Edema 02/04/2012  . POSTMENOPAUSAL STATUS 06/04/2010  . ANXIETY 02/13/2010  . PANIC ATTACK 10/31/2009  . SLEEP APNEA, OBSTRUCTIVE, MODERATE 02/19/2009  . SINUSITIS, CHRONIC 08/17/2008  . UNSPECIFIED VITAMIN D DEFICIENCY 01/21/2008  . OSTEOPENIA 11/24/2007  . IRRITABLE BOWEL SYNDROME 11/17/2007  . ALLERGIC RHINITIS 09/24/2007  . COPD 05/12/2007  . DIABETES MELLITUS II, UNCOMPLICATED 10/08/2006  . HYPERLIPIDEMIA 10/08/2006  . OBESITY, NOS 10/08/2006  . TOBACCO DEPENDENCE 10/08/2006  . MIGRAINE, COMMON, INTRACTABLE 10/08/2006  . GLAUCOMA 10/08/2006  . GASTROESOPHAGEAL REFLUX, NO ESOPHAGITIS 10/08/2006  .  HERNIA, HIATAL, NONCONGENITAL 10/08/2006  . INCONTINENCE, STRESS, FEMALE 10/08/2006  . DJD (degenerative joint disease) of knee 10/08/2006  . CONVULSIONS, SEIZURES, NOS 10/08/2006   Past Medical History  Diagnosis Date  . Allergic rhinitis   . Hypertension   . Hyperlipidemia   . GERD (gastroesophageal reflux disease)   . Glaucoma(365)   . Neck pain     Cervical disc  . IBS (irritable bowel syndrome)   . Esophageal stricture   . Diverticulosis   . Right leg DVT 1982    groin  . COPD (chronic obstructive pulmonary disease)   . Emphysema   . Pneumonia 2012; 04/27/2012  . Chronic bronchitis     "keep it all the time" (04/27/2012)  . Exertional dyspnea   . OSA on CPAP   . Type II diabetes mellitus     No meds since weight loss  . Migraine   . Epilepsy   . DJD (degenerative joint disease)   . Arthritis     "hands and legs and feet" (04/27/2012)  . Depression     Past Surgical History  Procedure Date  . Tubal ligation   . Knee arthroscopy 1990's    left  . Carpal tunnel release ~ 2008    right  . Shoulder open rotator cuff repair ~ 2010    left  . Abdominal hysterectomy 2001    "partial"     (Not in a hospital admission) Allergies  Allergen Reactions  . Amoxicillin Anaphylaxis  . Azithromycin Swelling and Rash    "swelling all over"  . Doxycycline Hyclate Anaphylaxis and Rash  . Penicillins Shortness Of Breath and Rash    "almost died"  . Sulfonamide Derivatives Rash  . Clarithromycin Hives  . Fluticasone-Salmeterol Other (  See Comments)    REACTION: ulcers in mouth  . Nortriptyline Other (See Comments)    Per patient had "kidney problems" on this medication   Current Outpatient Prescriptions on File Prior to Visit  Medication Sig Dispense Refill  . acetaminophen (TYLENOL) 325 MG tablet Take 975 mg by mouth every 6 (six) hours as needed. For pain      . albuterol (PROAIR HFA) 108 (90 BASE) MCG/ACT inhaler Inhale 2 puffs into the lungs every 6 (six) hours as  needed. Shortness of breath      . albuterol (PROVENTIL) (2.5 MG/3ML) 0.083% nebulizer solution Take 2.5 mg by nebulization every 6 (six) hours as needed. For shortness of breath      . Blood Glucose Monitoring Suppl (BLOOD GLUCOSE METER) kit Use to check blood glucose daily.  Accuchek preffered by patient  1 each  0  . Cholecalciferol (VITAMIN D3) 2000 UNITS TABS Take 1 tablet by mouth daily.      . cyclobenzaprine (FLEXERIL) 10 MG tablet Take 1 tablet (10 mg total) by mouth 3 (three) times daily as needed for muscle spasms.  30 tablet  2  . dicyclomine (BENTYL) 20 MG tablet Take 1 tablet (20mg) by mouth four times daily- Before meals and at bedtime  120 tablet  5  . doxycycline (VIBRA-TABS) 100 MG tablet Take 1 tablet (100 mg total) by mouth 2 (two) times daily.  14 tablet  0  . fluticasone (FLONASE) 50 MCG/ACT nasal spray Place 2 sprays into the nose daily.  16 g  1  . Glucose Blood (BLOOD GLUCOSE TEST STRIPS) STRP Use to test blood glucose daily.  100 each  12  . HYDROcodone-acetaminophen (NORCO/VICODIN) 5-325 MG per tablet Take 1 tablet by mouth daily as needed for pain.  30 tablet  0  . hydrOXYzine (ATARAX/VISTARIL) 50 MG tablet Take 62.5 mg by mouth every 6 (six) hours as needed. For itching      . lactulose (CHRONULAC) 10 GM/15ML solution take 30-45mL as needed for soft bowel movements.  disp 300mL       . lamoTRIgine (LAMICTAL) 150 MG tablet Take 1 tablet (150 mg total) by mouth 2 (two) times daily.  60 tablet  3  . levofloxacin (LEVAQUIN) 500 MG tablet Take 1 tablet (500 mg total) by mouth daily. Please ignore prior Doxycyline prescription  7 tablet  0  . meloxicam (MOBIC) 7.5 MG tablet Take 7.5 mg by mouth daily.      . omeprazole (PRILOSEC) 40 MG capsule Take 40 mg by mouth 2 (two) times daily.      . pramoxine-hydrocortisone (ANALPRAM HC) cream Apply 1 application topically daily as needed. For itching      . predniSONE (DELTASONE) 50 MG tablet Take 1 tab daily x 5 days  5 tablet  0  .  simvastatin (ZOCOR) 40 MG tablet Take 1 tablet (40 mg total) by mouth at bedtime.  30 tablet  5  . SPIRIVA HANDIHALER 18 MCG inhalation capsule Place 1 capsule into inhaler and inhale daily  30 capsule  1  . topiramate (TOPAMAX) 100 MG tablet Take 100 mg by mouth 2 (two) times daily.        . varenicline (CHANTIX) 0.5 MG tablet 0.5 mg daily for 3 days, then 0.5 mg bid for 4 days.  Then start taking 1mg tablet  11 tablet  0  . varenicline (CHANTIX) 1 MG tablet Take 1 tablet (1 mg total) by mouth 2 (two) times daily.  30 tablet    2   Current Facility-Administered Medications on File Prior to Visit  Medication Dose Route Frequency Provider Last Rate Last Dose  . diphenhydrAMINE (BENADRYL) injection 50 mg  50 mg Intramuscular Once Cody Matthews, DO         History  Substance Use Topics  . Smoking status: Current Every Day Smoker -- 0.5 packs/day for 36 years    Types: Cigarettes    Start date: 08/12/1975  . Smokeless tobacco: Never Used     Comment: has to quitt before surgery   . Alcohol Use: No    Family History  Problem Relation Age of Onset  . Heart disease Maternal Grandfather   . Heart disease      uncle  . Diabetes Maternal Grandfather   . Breast cancer Maternal Aunt   . Alcohol abuse Father   . Bipolar disorder Son   . Muscular dystrophy Son   . Diabetes      aunts  . Diabetes      uncles  . Heart disease Sister   . Lung cancer Father     was a smoker  . Colon cancer Neg Hx   . Lung cancer Mother     was a smoker  . Other Sister     blood clotting disorder  . Stroke Mother   . Heart disease Son   . Emphysema Father     was a smoker     Review of Systems  Constitutional: Negative.   HENT: Negative.   Respiratory: Positive for cough, sputum production, shortness of breath and wheezing.   Cardiovascular: Negative.   Gastrointestinal: Negative.   Genitourinary: Negative.   Musculoskeletal: Positive for joint pain.  Skin: Negative.   Neurological: Negative.    Endo/Heme/Allergies: Negative.   Psychiatric/Behavioral: Negative.     Objective:  Physical Exam  Constitutional: She is oriented to person, place, and time. She appears well-developed and well-nourished.  HENT:  Head: Normocephalic and atraumatic.  Eyes: Conjunctivae normal and EOM are normal. Pupils are equal, round, and reactive to light.  Neck: Neck supple.  Cardiovascular: Normal rate and regular rhythm.   Respiratory: Effort normal. She has wheezes.  GI: Soft. Bowel sounds are normal.  Genitourinary:       Not pertinent to current symptomatology therefore not examined.  Musculoskeletal:       Examination of both knees reveals pain bilaterally, 1+ crepitation 1+ synovitis range of motion from -5 to 125 degrees both knees are stable with moderate varus deformity diffuse pain and normal patella tracking.   Neurological: She is alert and oriented to person, place, and time.  Skin: Skin is warm and dry.  Psychiatric: She has a normal mood and affect. Her behavior is normal. Judgment and thought content normal.    Vital signs in last 24 hours: Last recorded: 12/24 0900   BP: 117/76 Pulse: 89  Temp: 97.4 F (36.3 C)    Height: 5' 1" (1.549 m) SpO2: 97  Weight: 83.462 kg (184 lb)    Labs:   Estimated Body mass index is 34.46 kg/(m^2) as calculated from the following:   Height as of 07/23/12: 5' 1"(1.549 m).   Weight as of 07/23/12: 182 lb 6.4 oz(82.736 kg).   Imaging Review Plain radiographs demonstrate severe degenerative joint disease of the right knee(s). The overall alignment issignificant varus. The bone quality appears to be good for age and reported activity level.  Assessment/Plan:  End stage arthritis, right knee   The patient history, physical examination, clinical   judgment of the provider and imaging studies are consistent with end stage degenerative joint disease of the right knee(s) and total knee arthroplasty is deemed medically necessary. The treatment  options including medical management, injection therapy arthroscopy and arthroplasty were discussed at length. The risks and benefits of total knee arthroplasty were presented and reviewed. The risks due to aseptic loosening, infection, stiffness, patella tracking problems, thromboembolic complications and other imponderables were discussed. The patient acknowledged the explanation, agreed to proceed with the plan and consent was signed. Patient is being admitted for inpatient treatment for surgery, pain control, PT, OT, prophylactic antibiotics, VTE prophylaxis, progressive ambulation and ADL's and discharge planning. The patient is planning to be discharged home with home health services   

## 2012-08-23 NOTE — Evaluation (Signed)
Physical Therapy Evaluation Patient Details Name: [name] MRN: [id number] DOB: [birth year] Today's Date: 08/23/2012 Time: 7829-5621 PT Time Calculation (min): 21 min  PT Assessment / Plan / Recommendation Clinical Impression  Pt. is a 57 y.o. female s/p right TKA today and presents with decreased activity tolerance, decreased AROM and strength right LE with pain all limiting independence with mobility.  She will benefit from skilled PT while in acute setting to maximize independence and allow safe d/c home with family assist and HHPT.    PT Assessment  Patient needs continued PT services    Follow Up Recommendations  Home health PT;Supervision/Assistance - 24 hour                Equipment Recommendations  Rolling walker with 5" wheels (TBA; has 4 wheeled walker at home she used PTA)         Frequency 7X/week    Precautions / Restrictions Precautions Precautions: Knee Restrictions RLE Weight Bearing: Weight bearing as tolerated   Pertinent Vitals/Pain "10/10"  RN premedicated     Mobility  Bed Mobility Bed Mobility: Supine to Sit Supine to Sit: 3: Mod assist Details for Bed Mobility Assistance: for both LE's and scooting to edge Transfers Transfers: Sit to Stand;Stand to Sit;Stand Pivot Transfers Sit to Stand: From bed;With upper extremity assist;1: +2 Total assist Sit to Stand: Patient Percentage: 60% Stand to Sit: 3: Mod assist;With armrests;To chair/3-in-1 Stand Pivot Transfers: 1: +2 Total assist (with walker) Stand Pivot Transfers: Patient Percentage: 70% Details for Transfer Assistance: assist due to right leg weakness, cues for backing up and safety with stand to sit. Ambulation/Gait Ambulation/Gait Assistance: Not tested (comment)         Exercises Total Joint Exercises Ankle Circles/Pumps: AROM;Both;10 reps;Supine Quad Sets:  (attempted, patient unable to activate quads)   PT Diagnosis: Acute pain;Generalized weakness;Difficulty walking  PT Problem  List: Decreased strength;Decreased range of motion;Decreased knowledge of use of DME;Decreased activity tolerance;Decreased balance;Decreased mobility;Pain PT Treatment Interventions: DME instruction;Gait training;Stair training;Functional mobility training;Therapeutic exercise;Balance training;Patient/family education;Therapeutic activities   PT Goals Acute Rehab PT Goals PT Goal Formulation: With patient Time For Goal Achievement: 08/28/12 Potential to Achieve Goals: Good Pt will go Supine/Side to Sit: with supervision PT Goal: Supine/Side to Sit - Progress: Goal set today Pt will go Sit to Supine/Side: with supervision PT Goal: Sit to Supine/Side - Progress: Goal set today Pt will go Sit to Stand: with supervision;with upper extremity assist PT Goal: Sit to Stand - Progress: Goal set today Pt will go Stand to Sit: with supervision;with upper extremity assist PT Goal: Stand to Sit - Progress: Goal set today Pt will Ambulate: >150 feet;with least restrictive assistive device PT Goal: Ambulate - Progress: Goal set today Pt will Go Up / Down Stairs: 1-2 stairs;with rail(s);with min assist PT Goal: Up/Down Stairs - Progress: Goal set today Pt will Perform Home Exercise Program: with supervision, verbal cues required/provided PT Goal: Perform Home Exercise Program - Progress: Goal set today  Visit Information  Last PT Received On: 08/23/12    Subjective Data  Subjective: I was using a walker before Patient Stated Goal: To go home with help   Prior Functioning  Home Living Lives With: Family Available Help at Discharge: Family Type of Home: House Home Access: Stairs to enter Secretary/administrator of Steps: 2 Entrance Stairs-Rails: Left;Right Home Layout: One level Bathroom Shower/Tub: Engineer, manufacturing systems: Standard Home Adaptive Equipment: Bedside commode/3-in-1;Walker - four wheeled;Shower chair with back Prior Function Level of Independence:  Independent with  assistive device(s) Driving: No Communication Communication: No difficulties    Cognition  Overall Cognitive Status: Appears within functional limits for tasks assessed/performed Arousal/Alertness: Awake/alert Orientation Level: Appears intact for tasks assessed Behavior During Session: [facility] for tasks performed    Extremity/Trunk Assessment Right Upper Extremity Assessment RUE ROM/Strength/Tone: [facility] for tasks assessed Left Upper Extremity Assessment LUE ROM/Strength/Tone: WFL for tasks assessed Right Lower Extremity Assessment RLE ROM/Strength/Tone: Deficits;Due to pain RLE ROM/Strength/Tone Deficits: unable to extend past approx 30 degrees, no quad activation yet; ankle AROM WFL Left Lower Extremity Assessment LLE ROM/Strength/Tone: WFL for tasks assessed   Balance    End of Session PT - End of Session Equipment Utilized During Treatment: Gait belt Activity Tolerance: Patient limited by pain Patient left: in chair;with call bell/phone within reach CPM Right Knee CPM Right Knee: On Right Knee Flexion (Degrees): 60  Right Knee Extension (Degrees): 0   GP     [name],[name] 08/23/2012, 3:04 PM  Sheran Lawless, PT [phone] 08/23/2012

## 2012-08-23 NOTE — Op Note (Signed)
MRN:     [id number] DOB/AGE:    [birth year] / 57 y.o.       OPERATIVE REPORT    DATE OF PROCEDURE:  08/23/2012       PREOPERATIVE DIAGNOSIS:   End stage degenerative joint disease, right knee      Estimated Body mass index is 34.56 kg/(m^2) as calculated from the following:   Height as of 06/24/12: 5\' 1" (1.549 m).   Weight as of 06/24/12: 182 lb 14.4 oz(82.963 kg).                                                        POSTOPERATIVE DIAGNOSIS:   End stage degenerative joint disease, right knee                                                                      PROCEDURE:  Procedure(s): TOTAL KNEE ARTHROPLASTY Using Depuy Sigma RP implants #2 Femur, #2Tibia, 10mm sigma RP bearing, 32 Patella     SURGEON: Kaysi Ourada A    ASSISTANT:  Kirstin Shepperson PA-C   (Present and scrubbed throughout the case, critical for assistance with exposure, retraction, instrumentation, and closure.)         ANESTHESIA: GET with Femoral Nerve Block  DRAINS: foley, 2 medium hemovac in knee   TOURNIQUET TIME:   COMPLICATIONS:  None     SPECIMENS: None   INDICATIONS FOR PROCEDURE: The patient has  End stage degenerative joint disease, right knee, varus deformities, XR shows bone on bone arthritis. Patient has failed all conservative measures including anti-inflammatory medicines, narcotics, attempts at  exercise and weight loss, cortisone injections and viscosupplementation.  Risks and benefits of surgery have been discussed, questions answered.   DESCRIPTION OF PROCEDURE: The patient identified by armband, received  right femoral nerve block and IV antibiotics, in the holding area at [facility]. Patient taken to the operating room, appropriate anesthetic  monitors were attached General endotracheal anesthesia induced with  the patient in supine position, Foley catheter was inserted. Tourniquet  applied high to the operative thigh. Lateral post and foot positioner  applied to the table,  the lower extremity was then prepped and draped  in usual sterile fashion from the ankle to the tourniquet. Time-out procedure was performed. The limb was wrapped with an Esmarch bandage and the tourniquet inflated to 365 mmHg. We began the operation by making the anterior midline incision starting at handbreadth above the patella going over the patella 1 cm medial to and  4 cm distal to the tibial tubercle. Small bleeders in the skin and the  subcutaneous tissue identified and cauterized. Transverse retinaculum was incised and reflected medially and a medial parapatellar arthrotomy was accomplished. the patella was everted and theprepatellar fat pad resected. The superficial medial collateral  ligament was then elevated from anterior to posterior along the proximal  flare of the tibia and anterior half of the menisci resected. The knee was hyperflexed exposing bone on bone arthritis. Peripheral and notch osteophytes as well as the cruciate ligaments were then resected. We continued to  work  our way around posteriorly along the proximal tibia, and externally  rotated the tibia subluxing it out from underneath the femur. A McHale  retractor was placed through the notch and a lateral Hohmann retractor  placed, and we then drilled through the proximal tibia in line with the  axis of the tibia followed by an intramedullary guide rod and 2-degree  posterior slope cutting guide. The tibial cutting guide was pinned into place  allowing resection of 4 mm of bone medially and about 5 mm of bone  laterally because of her varus deformity. Satisfied with the tibial resection, we then  entered the distal femur 2 mm anterior to the PCL origin with the  intramedullary guide rod and applied the distal femoral cutting guide  set at 11mm, with 5 degrees of valgus. This was pinned along the  epicondylar axis. At this point, the distal femoral cut was accomplished without difficulty. We then sized for a #2 femoral  component and pinned the guide in 3 degrees of external rotation.The chamfer cutting guide was pinned into place. The anterior, posterior, and chamfer cuts were accomplished without difficulty followed by  the Sigma RP box cutting guide and the box cut. We also removed posterior osteophytes from the posterior femoral condyles. At this  time, the knee was brought into full extension. We checked our  extension and flexion gaps and found them symmetric at 10mm.  The patella thickness measured at 19 mm. We set the cutting guide at 12 and removed the posterior 7.5 mm  of the patella sized for 32 button and drilled the lollipop. The knee  was then once again hyperflexed exposing the proximal tibia. We sized for a #2 tibial base plate, applied the smokestack and the conical reamer followed by the the Delta fin keel punch. We then hammered into place the Sigma RP trial femoral component, inserted a 10-mm trial bearing, trial patellar button, and took the knee through range of motion from 0-130 degrees. No thumb pressure was required for patellar  tracking. At this point, all trial components were removed, a double batch of DePuy HV cement with 1500 mg of Tobramycin was mixed and applied to all bony metallic mating surfaces except for the posterior condyles of the femur itself. In order, we  hammered into place the tibial tray and removed excess cement, the femoral component and removed excess cement, a 10-mm Sigma RP bearing  was inserted, and the knee brought to full extension with compression.  The patellar button was clamped into place, and excess cement  removed. While the cement cured the wound was irrigated out with normal saline solution pulse lavage, and medium Hemovac drains were placed.. Ligament stability and patellar tracking were checked and found to be excellent. The tourniquet was then released and hemostasis was obtained with cautery. The parapatellar arthrotomy was closed with  #1 ethibond  suture. The subcutaneous tissue with 0 and 2-0 undyed  Vicryl suture, and 4-0 Monocryl.. A dressing of Xeroform,  4 x 4, dressing sponges, Webril, and Ace wrap applied. Needle and sponge count were correct times 2.The patient awakened, extubated, and taken to recovery room without difficulty. Vascular status was normal, pulses 2+ and symmetric.   Zimere Dunlevy A 08/23/2012, 9:10 AM

## 2012-08-23 NOTE — Preoperative (Signed)
Beta Blockers   Reason not to administer Beta Blockers:Not Applicable 

## 2012-08-23 NOTE — Anesthesia Preprocedure Evaluation (Addendum)
Anesthesia Evaluation  Patient identified by MRN, date of birth, ID band Patient awake    Reviewed: Allergy & Precautions, H&P , NPO status , Patient's Chart, lab work & pertinent test results  Airway Mallampati: I  Neck ROM: Full    Dental  (+) Dental Advidsory Given and Teeth Intact   Pulmonary shortness of breath and with exertion, sleep apnea , pneumonia -, resolved, COPD COPD inhaler,    + decreased breath sounds+ wheezing      Cardiovascular hypertension, On Medications + Peripheral Vascular Disease Rhythm:Regular Rate:Normal     Neuro/Psych  Headaches, Seizures -, Well Controlled,  PSYCHIATRIC DISORDERS  Neuromuscular disease    GI/Hepatic GERD-  Medicated and Controlled,  Endo/Other  diabetes, Well Controlled, Type 2  Renal/GU      Musculoskeletal   Abdominal (+) + obese,   Peds  Hematology   Anesthesia Other Findings   Reproductive/Obstetrics                          Anesthesia Physical Anesthesia Plan  ASA: III  Anesthesia Plan:    Post-op Pain Management:    Induction: Intravenous  Airway Management Planned: Oral ETT  Additional Equipment:   Intra-op Plan:   Post-operative Plan: Extubation in OR  Informed Consent:   Dental Advisory Given  Plan Discussed with: Anesthesiologist, CRNA and Surgeon  Anesthesia Plan Comments:        Anesthesia Quick Evaluation

## 2012-08-23 NOTE — Anesthesia Procedure Notes (Addendum)
Anesthesia Regional Block:  Femoral nerve block  Pre-Anesthetic Checklist: ,, timeout performed, Correct Patient, Correct Site, Correct Laterality, Correct Procedure, Correct Position, site marked, Risks and benefits discussed, at surgeon's request and post-op pain management  Laterality: Right and Upper  Prep: Betadine       Needles:  Injection technique: Single-shot  Needle Type: Stimulator Needle - 80      Needle Gauge: 22 and 22 G  Needle insertion depth: 6 cm   Additional Needles:  Procedures: nerve stimulator Femoral nerve block  Nerve Stimulator or Paresthesia:  Response: Twitch elicited, 0.8 mA,   Additional Responses:   Narrative:  Start time: 08/23/2012 6:50 AM End time: 08/23/2012 7:08 AM Injection made incrementally with aspirations every 5 mL.  Performed by: Personally  Anesthesiologist: Alma Friendly, MD  Additional Notes: BP cuff, EKG monitors applied. Sedation begun. Femoral artery palpated for location of nerve. After nerve location anesthetic injected incrementally, slowly , and after neg aspirations. Tolerated well.  Femoral nerve block Procedure Name: Intubation Date/Time: 08/23/2012 7:25 AM Performed by: Carmela Rima Pre-anesthesia Checklist: Patient identified, Timeout performed, Emergency Drugs available, Suction available and Patient being monitored Patient Re-evaluated:Patient Re-evaluated prior to inductionOxygen Delivery Method: Circle system utilized Preoxygenation: Pre-oxygenation with 100% oxygen Intubation Type: IV induction Ventilation: Mask ventilation without difficulty Laryngoscope Size: Mac and 4 Grade View: Grade I Tube type: Oral Tube size: 7.5 mm Number of attempts: 1 Placement Confirmation: ETT inserted through vocal cords under direct vision,  positive ETCO2 and breath sounds checked- equal and bilateral Secured at: 21 cm Tube secured with: Tape Dental Injury: Teeth and Oropharynx as per pre-operative assessment

## 2012-08-23 NOTE — Progress Notes (Signed)
Orthopedic Tech Progress Note Patient Details:  [name] 1956-04-22 [id number] Knee immobilizer supplied after surgery.  Patient ID: [name], female   DOB: [birth year], 57 y.o.   MRN: [id number]   Orie Rout 08/23/2012, 12:45 PM

## 2012-08-23 NOTE — Progress Notes (Signed)
CARE MANAGEMENT NOTE 08/23/2012  Patient:  [name], [name]   Account Number:  [id number]  Date Initiated:  08/23/2012  Documentation initiated by:  Vance Peper  Subjective/Objective Assessment:   57 yr old female s/p right total knee arthroplasty.     Action/Plan:   Patient preoperatively setup with [facility], no changes.   Anticipated DC Date:     Anticipated DC Plan:  HOME W HOME HEALTH SERVICES         Choice offered to / List presented to:          [facility] arranged  HH-2 PT      [facility] agency  [facility]   Status of service:  In process, will continue to follow Medicare Important Message given?   (If response is "NO", the following Medicare IM given date fields will be blank) Date Medicare IM given:   Date Additional Medicare IM given:    Discharge Disposition:    Per UR Regulation:    If discussed at Long Length of Stay Meetings, dates discussed:    Comments:

## 2012-08-23 NOTE — Progress Notes (Signed)
UR COMPLETED  

## 2012-08-23 NOTE — Progress Notes (Signed)
Orthopedic Tech Progress Note Patient Details:  [name] [birth year] [id number] CPM applied to Right LE with appropriate settings. OHF applied to bed.  CPM Right Knee CPM Right Knee: On Right Knee Flexion (Degrees): 60  Right Knee Extension (Degrees): 0    [name] 08/23/2012, 10:19 AM

## 2012-08-23 NOTE — Anesthesia Postprocedure Evaluation (Signed)
  Anesthesia Post-op Note  Patient: Dajia Gunnels Prevo  Procedure(s) Performed: Procedure(s) (LRB) with comments: TOTAL KNEE ARTHROPLASTY (Right) - RIGHT KNEE ARTHROPLASTY MEDIAL AND LATERAL COMPARTMENTS WITH PATELLA RESURFACING  Patient Location: PACU  Anesthesia Type:GA combined with regional for post-op pain  Level of Consciousness: awake  Airway and Oxygen Therapy: Patient Spontanous Breathing  Post-op Pain: mild  Post-op Assessment: Post-op Vital signs reviewed  Post-op Vital Signs: stable  Complications: No apparent anesthesia complications

## 2012-08-24 ENCOUNTER — Encounter (HOSPITAL_COMMUNITY): Payer: Self-pay | Admitting: Orthopedic Surgery

## 2012-08-24 LAB — GLUCOSE, CAPILLARY
Glucose-Capillary: 138 mg/dL — ABNORMAL HIGH (ref 70–99)
Glucose-Capillary: 141 mg/dL — ABNORMAL HIGH (ref 70–99)
Glucose-Capillary: 121 mg/dL — ABNORMAL HIGH (ref 70–99)

## 2012-08-24 LAB — BASIC METABOLIC PANEL
Calcium: 8.6 mg/dL (ref 8.4–10.5)
GFR calc Af Amer: >90 mL/min (ref >90)
GFR calc non Af Amer: >90 mL/min (ref >90)
Glucose, Bld: 139 mg/dL — ABNORMAL HIGH (ref 70–99)
Sodium: 138 mEq/L (ref 135–145)

## 2012-08-24 LAB — CBC
Hemoglobin: 10 g/dL — ABNORMAL LOW (ref 12.0–15.0)
MCH: 31.4 pg (ref 26.0–34.0)
MCHC: 33.2 g/dL (ref 30.0–36.0)
RDW: 14 % (ref 11.5–15.5)

## 2012-08-24 MED ORDER — ALBUTEROL SULFATE (5 MG/ML) 0.5% IN NEBU
2.5000 mg | INHALATION_SOLUTION | RESPIRATORY_TRACT | Status: DC | PRN
  Administered 2012-08-27 – 2012-08-30 (×3): 2.5 mg via RESPIRATORY_TRACT
  Filled 2012-08-24 (×3): qty 0.5

## 2012-08-24 MED ORDER — ALBUTEROL SULFATE (5 MG/ML) 0.5% IN NEBU: 2.5000 mg | INHALATION_SOLUTION | Freq: Four times a day (QID) | RESPIRATORY_TRACT | Status: DC

## 2012-08-24 MED ORDER — ALBUTEROL SULFATE (5 MG/ML) 0.5% IN NEBU
2.5000 mg | INHALATION_SOLUTION | Freq: Four times a day (QID) | RESPIRATORY_TRACT | Status: DC
  Administered 2012-08-24 – 2012-09-03 (×37): 2.5 mg via RESPIRATORY_TRACT
  Filled 2012-08-24 (×38): qty 0.5

## 2012-08-24 NOTE — Progress Notes (Signed)
CARE MANAGEMENT NOTE 08/24/2012  Patient:  [name], [name]   Account Number:  [id number]  Date Initiated:  08/23/2012  Documentation initiated by:  Vance Peper  Subjective/Objective Assessment:   57 yr old female s/p right total knee arthroplasty.     Action/Plan:   Patient preoperatively setup with [facility], no changes.  Patient has rolling walker, 3in1 and CPM. Has family support at discharge.   Anticipated DC Date:  08/25/2012   Anticipated DC Plan:  HOME W HOME HEALTH SERVICES         Choice offered to / List presented to:          [facility] arranged  HH-2 PT      [facility] agency  [facility]   Status of service:  Completed, signed off Medicare Important Message given?   (If response is "NO", the following Medicare IM given date fields will be blank) Date Medicare IM given:   Date Additional Medicare IM given:    Discharge Disposition:  HOME W HOME HEALTH SERVICES  Per UR Regulation:    If discussed at Long Length of Stay Meetings, dates discussed:    Comments:

## 2012-08-24 NOTE — Progress Notes (Signed)
Patient ID: [name], female   DOB: 09/25/1955, 57 y.o.   MRN: [id number] PATIENT ID: [name]  MRN: [id number]  DOB/AGE:  1955-08-19 / 57 y.o.  1 Day Post-Op Procedure(s) (LRB): TOTAL KNEE ARTHROPLASTY (Right)    PROGRESS NOTE Subjective: Patient is alert, oriented, no Nausea, no Vomiting, yes passing gas, no Bowel Movement. Taking PO well. Denies SOB, Chest or Calf Pain. Using Incentive Spirometer, PAS in place. Ambulate in hall   Lots of verbal cues for safety, CPM 0-60 Patient reports pain as 8 on 0-10 scale  .    Objective: Vital signs in last 24 hours: Filed Vitals:   08/23/12 2232 08/24/12 0000 08/24/12 0400 08/24/12 0615  BP: 135/68   140/72  Pulse: 96   90  Temp: 97.4 F (36.3 C)   97.8 F (36.6 C)  TempSrc:      Resp: 20 16 16 18   SpO2: 99%   99%      Intake/Output from previous day: I/O last 3 completed shifts: In: 1000 [I.V.:1000] Out: 905 [Urine:550; Drains:350; Blood:5]   Intake/Output this shift:     LABORATORY DATA:  Basename 08/24/12 0701 08/24/12 0605 08/23/12 2248 08/23/12 1607 08/23/12 1315  WBC -- 14.8* -- -- 14.1*  HGB -- 10.0* -- -- 11.1*  HCT -- 30.1* -- -- 34.5*  PLT -- 230 -- -- 250  NA -- 138 -- -- --  K -- 3.7 -- -- --  CL -- 107 -- -- --  CO2 -- 20 -- -- --  BUN -- 8 -- -- --  CREATININE -- 0.64 -- -- 0.67  GLUCOSE -- 139* -- -- --  GLUCAP 138* -- 165* 148* --  INR -- -- -- -- --  CALCIUM -- 8.6 -- -- --    Examination: ABD soft Neurovascular intact Sensation intact distally Intact pulses distally Dorsiflexion/Plantar flexion intact Incision: moderate drainage}  Assessment:   1 Day Post-Op Procedure(s) (LRB): TOTAL KNEE ARTHROPLASTY (Right) ADDITIONAL DIAGNOSIS:  Principal Problem:  *Right knee DJD Active Problems:  DIABETES MELLITUS II, UNCOMPLICATED  Unspecified vitamin D deficiency  HYPERLIPIDEMIA  OBESITY, NOS  BMI 33.8  ANXIETY  PANIC ATTACK  SLEEP APNEA, OBSTRUCTIVE, MODERATE  MIGRAINE, COMMON,  INTRACTABLE  GLAUCOMA  COPD GOLD 0  GASTROESOPHAGEAL REFLUX, NO ESOPHAGITIS  HERNIA, HIATAL, NONCONGENITAL  Irritable bowel syndrome  OSTEOPENIA  CONVULSIONS, SEIZURES, NOS   Plan: PT/OT WBAT, CPM 5/hrs day until ROM 0-90 degrees, then D/C CPM DVT Prophylaxis:  SCDx72hrs, lovenox DISCHARGE PLAN: Home DISCHARGE NEEDS: HHPT, CPM and Walker Please make sure she gets her nebulizer q 4 hrs.   Aggressive insentive spirometry.  Probably home tomorrow  Carlyle Basques 08/24/2012, 8:17 AM

## 2012-08-24 NOTE — Progress Notes (Signed)
Nutrition Brief Note  Patient identified on the Malnutrition Screening Tool (MST) Report.  Patient has had a significant intentional weight loss in the last year (> 100 lbs) due to lifestyle and medication changes.  BMI = 34.77 kg/m2 ---> patient meets criteria for Obesity Class I based on current BMI.   Current diet order is Carbohydrate Modified Medium Calorie.  Reports she's never been a "big eater".  Labs and medications reviewed.   No nutrition interventions warranted at this time.  Please consult RD as needed.  Maureen Chatters, RD, LDN Pager #: [phone] After-Hours Pager #: [phone]

## 2012-08-24 NOTE — Progress Notes (Signed)
Physical Therapy Treatment Patient Details Name: [name] MRN: [id number] DOB: 14-Jan-1956 Today's Date: 08/24/2012 Time: 9629-5284 PT Time Calculation (min): 25 min  PT Assessment / Plan / Recommendation Comments on Treatment Session  Patient required some push to work with therapy. Did well with ambulation tolerance, requiring cues for safety. Patient encourage for deep breathing and use of IS. Will continue to work on ambulation and activity tolerance this afternoon    Follow Up Recommendations  Home health PT;Supervision/Assistance - 24 hour     Does the patient have the potential to tolerate intense rehabilitation     Barriers to Discharge        Equipment Recommendations  Rolling walker with 5" wheels    Recommendations for Other Services    Frequency 7X/week   Plan Discharge plan remains appropriate;Frequency remains appropriate    Precautions / Restrictions Precautions Precautions: Knee Required Braces or Orthoses: Knee Immobilizer - Right Restrictions RLE Weight Bearing: Weight bearing as tolerated   Pertinent Vitals/Pain     Mobility  Bed Mobility Bed Mobility: Not assessed Transfers Sit to Stand: 4: Min assist;With upper extremity assist;With armrests;From chair/3-in-1 Stand to Sit: 4: Min assist;With upper extremity assist;With armrests;To chair/3-in-1 Details for Transfer Assistance: A for stability. Cues for safe technique and hand placement. Stood x2 Ambulation/Gait Ambulation/Gait Assistance: 4: Min Environmental consultant (Feet): 80 Feet (one seated rest break) Assistive device: Rolling walker Ambulation/Gait Assistance Details: A for balance and RW management. Patient with short, hopping steps. Cues to smooth out gait and for RW management (safety) Gait Pattern: Step-to pattern;Decreased step length - right;Decreased step length - left;Trunk flexed    Exercises Total Joint Exercises Quad Sets: AROM;Right;10 reps Short Arc QuadBarbaraann Boys;Right;10  reps Heel Slides: AAROM;Right;10 reps Hip ABduction/ADduction: AAROM;Right;10 reps Straight Leg Raises: AAROM;Right;10 reps   PT Diagnosis:    PT Problem List:   PT Treatment Interventions:     PT Goals Acute Rehab PT Goals PT Goal: Sit to Stand - Progress: Progressing toward goal PT Goal: Stand to Sit - Progress: Progressing toward goal PT Goal: Ambulate - Progress: Progressing toward goal  Visit Information  Last PT Received On: 08/24/12 Assistance Needed: +1    Subjective Data      Cognition  Overall Cognitive Status: Appears within functional limits for tasks assessed/performed Arousal/Alertness: Awake/alert Orientation Level: Appears intact for tasks assessed Behavior During Session: [facility] for tasks performed    Balance     End of Session PT - End of Session Equipment Utilized During Treatment: Gait belt Activity Tolerance: Patient tolerated treatment well Patient left: in chair;with call bell/phone within reach Nurse Communication: Mobility status   GP     Buena, Boehm 08/24/2012, 9:37 AM  08/24/2012 Fredrich Birks PTA [phone] pager [phone] office

## 2012-08-24 NOTE — Progress Notes (Signed)
Physical Therapy Treatment Patient Details Name: [name] MRN: [id number] DOB: 01-Sep-1955 Today's Date: 08/24/2012 Time: 4098-1191 PT Time Calculation (min): 30 min  PT Assessment / Plan / Recommendation Comments on Treatment Session  Attempted steps this session. Patient unable to complete due to decreased strength and stability and increased fatigue. Will attempt next session prior to gait training. Pt is eager to go home and therefore very motivated to participate in therapy.    Follow Up Recommendations  Home health PT;Supervision/Assistance - 24 hour     Equipment Recommendations  Rolling walker with 5" wheels    Frequency 7X/week   Plan Discharge plan remains appropriate;Frequency remains appropriate    Precautions / Restrictions Precautions Precautions: Knee Required Braces or Orthoses: Knee Immobilizer - Right Restrictions RLE Weight Bearing: Weight bearing as tolerated   Pertinent Vitals/Pain 8/10 pain. Reposition and rest    Mobility  Bed Mobility Bed Mobility: Sit to Supine Sit to Supine: 4: Min guard Details for Bed Mobility Assistance: cueing for safety  Transfers Transfers: Sit to Stand Sit to Stand: 4: Min assist;With upper extremity assist;From chair/3-in-1 (cues for safety pushing up and taking her time) Ambulation/Gait Ambulation/Gait Assistance: 4: Min assist Ambulation Distance (Feet): 80 Feet (one seated rest break) Assistive device: Rolling walker Ambulation/Gait Assistance Details: Patient require max cueing to slow down and take her time.She becomes anxious and begans to increase velocity and deviate from gait pattern. Requires cueing for heel strike. Gait Pattern: Step-to pattern;Decreased stance time - left Gait velocity: increase with distance    Exercises Total Joint Exercises Ankle Circles/Pumps: AROM;15 reps;Both;Seated Quad Sets: AROM;15 reps;Seated Short Arc Quad: AAROM;Right;10 reps;Seated Heel Slides: AAROM;Right;Seated;15 reps Hip  ABduction/ADduction: AAROM;Right;Seated;15 reps Straight Leg Raises: AAROM;Right;10 reps;Seated   PT Diagnosis:    PT Problem List:   PT Treatment Interventions:     PT Goals Acute Rehab PT Goals PT Goal: Supine/Side to Sit - Progress: Progressing toward goal PT Goal: Sit to Supine/Side - Progress: Progressing toward goal PT Goal: Sit to Stand - Progress: Progressing toward goal PT Goal: Stand to Sit - Progress: Progressing toward goal PT Goal: Ambulate - Progress: Progressing toward goal PT Goal: Perform Home Exercise Program - Progress: Progressing toward goal  Visit Information  Last PT Received On: 08/24/12 Assistance Needed: +1    Cognition  Overall Cognitive Status: Appears within functional limits for tasks assessed/performed Arousal/Alertness: Awake/alert Orientation Level: Appears intact for tasks assessed Behavior During Session: [facility] for tasks performed    End of Session PT - End of Session Equipment Utilized During Treatment: Gait belt;Right knee immobilizer Activity Tolerance: Patient tolerated treatment well Patient left: in bed;with call bell/phone within reach CPM Right Knee CPM Right Knee: Off Right Knee Flexion (Degrees): 60  Right Knee Extension (Degrees): 0    GP     Lazaro Arms 08/24/2012, 2:12 PM

## 2012-08-24 NOTE — Evaluation (Signed)
Occupational Therapy Evaluation Patient Details Name: [name] MRN: [id number] DOB: May 05, 1956 Today's Date: 08/24/2012 Time: 4098-1191 OT Time Calculation (min): 21 min  OT Assessment / Plan / Recommendation Clinical Impression  Pt demos decline in function with ADLs, strength, balance, safety and activity tolerance and would benefit from skilled OT services to restore PLOF to return home safely    OT Assessment  Patient needs continued OT Services    Follow Up Recommendations  No OT follow up    Barriers to Discharge None    Equipment Recommendations  None recommended by OT    Recommendations for Other Services    Frequency  Min 2X/week    Precautions / Restrictions Precautions Precautions: Knee Required Braces or Orthoses: Knee Immobilizer - Right Restrictions Weight Bearing Restrictions: Yes RLE Weight Bearing: Weight bearing as tolerated       ADL  Grooming: Performed;Wash/dry hands;Wash/dry face;Brushing hair Where Assessed - Grooming: Supported standing Upper Body Bathing: Simulated;Supervision/safety;Set up Where Assessed - Upper Body Bathing: Unsupported sitting Lower Body Bathing: Simulated;Moderate assistance Where Assessed - Lower Body Bathing: Unsupported sitting Upper Body Dressing: Performed;Supervision/safety;Set up Where Assessed - Upper Body Dressing: Unsupported sitting Lower Body Dressing: Performed;Moderate assistance Where Assessed - Lower Body Dressing: Unsupported sitting;Supported sit to stand Toilet Transfer: Performed;Min guard Toilet Transfer Method: Other (comment) (ambulating for RW level) Toilet Transfer Equipment: Regular height toilet;Raised toilet seat with arms (or 3-in-1 over toilet) Toileting - Clothing Manipulation and Hygiene: Performed;Minimal assistance Where Assessed - Engineer, mining and Hygiene: Standing Tub/Shower Transfer: Landscape architect Method: Engineer, production: Shower seat without back Equipment Used: Rolling walker;Sock aid;Gait belt;Knee Immobilizer;Long-handled shoe horn;Long-handled sponge;Reacher ADL Comments: Pt provided with education and demo of ADL A/E for home use    OT Diagnosis: Generalized weakness  OT Problem List: Decreased strength;Decreased activity tolerance;Decreased knowledge of use of DME or AE;Impaired balance (sitting and/or standing);Pain OT Treatment Interventions: Self-care/ADL training;Therapeutic exercise;Therapeutic activities;Neuromuscular education;DME and/or AE instruction;Patient/family education;Balance training   OT Goals Acute Rehab OT Goals OT Goal Formulation: With patient Time For Goal Achievement: 08/31/12 Potential to Achieve Goals: Good ADL Goals Pt Will Perform Grooming: with supervision;Standing at sink ADL Goal: Grooming - Progress: Goal set today Pt Will Perform Lower Body Bathing: with min assist;with adaptive equipment ADL Goal: Lower Body Bathing - Progress: Goal set today Pt Will Perform Lower Body Dressing: with min assist;with adaptive equipment ADL Goal: Lower Body Dressing - Progress: Goal set today Pt Will Transfer to Toilet: with supervision;with DME;Grab bars ADL Goal: Toilet Transfer - Progress: Goal set today Pt Will Perform Toileting - Clothing Manipulation: with supervision;Standing;Sitting on 3-in-1 or toilet ADL Goal: Toileting - Clothing Manipulation - Progress: Goal set today Pt Will Perform Toileting - Hygiene: with supervision;Sit to stand from 3-in-1/toilet;Sitting on 3-in-1 or toilet ADL Goal: Toileting - Hygiene - Progress: Goal set today Pt Will Perform Tub/Shower Transfer: with supervision;with DME;Grab bars ADL Goal: Tub/Shower Transfer - Progress: Goal set today  Visit Information  Last OT Received On: 08/24/12     Subjective Data  Subjective: ' I can't go home til I can climb the stairs " Patient Stated Goal: To retrun home asap   Prior  Functioning     Home Living Lives With: Family Available Help at Discharge: Family;Available 24 hours/day Type of Home: House Home Access: Stairs to enter [employer] of Steps: 2 Entrance Stairs-Rails: Right;Left Home Layout: One level Bathroom Shower/Tub: Engineer, manufacturing systems: Standard Home Adaptive Equipment: Dan Humphreys -  four wheeled;Shower chair with back;Bedside commode/3-in-1 Prior Function Level of Independence: Independent Driving: No Vocation: On disability Communication Communication: No difficulties Dominant Hand: Right         Vision/Perception Perception Perception: Within Functional Limits   Cognition  Overall Cognitive Status: Appears within functional limits for tasks assessed/performed Arousal/Alertness: Awake/alert Orientation Level: Appears intact for tasks assessed Behavior During Session: [facility] for tasks performed    Extremity/Trunk Assessment Right Upper Extremity Assessment RUE ROM/Strength/Tone: Within functional levels Left Upper Extremity Assessment LUE ROM/Strength/Tone: Within functional levels     Mobility Bed Mobility Bed Mobility: Supine to Sit;Sitting - Scoot to Edge of Bed Supine to Sit: 4: Min assist Sitting - Scoot to [place] of Bed: 4: Min assist Sit to Supine: 4: Min guard Details for Bed Mobility Assistance: cueing for safety  Transfers Transfers: Sit to Stand;Stand to Sit Sit to Stand: 4: Min assist;With upper extremity assist;From chair/3-in-1;From toilet;From bed Stand to Sit: 4: Min assist;To chair/3-in-1;To toilet;To bed     Shoulder Instructions        Balance Balance Balance Assessed: No   End of Session OT - End of Session Equipment Utilized During Treatment: Gait belt;Right knee immobilizer;Other (comment) (RW, 3 in 1 , ADL A/E) Activity Tolerance: Patient tolerated treatment well Patient left: in bed;with call bell/phone within reach   GO     [name] 08/24/2012, 2:57  PM

## 2012-08-24 NOTE — Progress Notes (Signed)
Seen and agreed 08/24/2012 Jaeleen Inzunza Elizabeth PTA [phone] pager [phone] office    

## 2012-08-25 ENCOUNTER — Inpatient Hospital Stay (HOSPITAL_COMMUNITY): Payer: Medicaid Other

## 2012-08-25 LAB — BASIC METABOLIC PANEL
Calcium: 8.8 mg/dL (ref 8.4–10.5)
GFR calc Af Amer: >90 mL/min (ref >90)
GFR calc non Af Amer: >90 mL/min (ref >90)
Potassium: 3.2 mEq/L — ABNORMAL LOW (ref 3.5–5.1)
Sodium: 139 mEq/L (ref 135–145)

## 2012-08-25 LAB — CBC
MCH: 32.1 pg (ref 26.0–34.0)
MCHC: 33.6 g/dL (ref 30.0–36.0)
Platelets: 211 10*3/uL (ref 150–400)
RDW: 14.2 % (ref 11.5–15.5)

## 2012-08-25 LAB — GLUCOSE, CAPILLARY
Glucose-Capillary: 112 mg/dL — ABNORMAL HIGH (ref 70–99)
Glucose-Capillary: 168 mg/dL — ABNORMAL HIGH (ref 70–99)

## 2012-08-25 MED ORDER — VANCOMYCIN HCL IN DEXTROSE 1-5 GM/200ML-% IV SOLN
1000.0000 mg | Freq: Two times a day (BID) | INTRAVENOUS | Status: DC
  Administered 2012-08-25 – 2012-09-01 (×15): 1000 mg via INTRAVENOUS
  Filled 2012-08-25 (×17): qty 200

## 2012-08-25 MED ORDER — DEXTROSE 5 % IV SOLN
1.0000 g | Freq: Three times a day (TID) | INTRAVENOUS | Status: DC
  Administered 2012-08-25 – 2012-08-30 (×16): 1 g via INTRAVENOUS
  Filled 2012-08-25 (×17): qty 1

## 2012-08-25 MED ORDER — DEXAMETHASONE SODIUM PHOSPHATE 10 MG/ML IJ SOLN
10.0000 mg | Freq: Every day | INTRAMUSCULAR | Status: DC
  Administered 2012-08-25 – 2012-08-27 (×3): 10 mg via INTRAVENOUS
  Filled 2012-08-25 (×4): qty 1

## 2012-08-25 MED ORDER — POTASSIUM CHLORIDE CRYS ER 20 MEQ PO TBCR
40.0000 meq | EXTENDED_RELEASE_TABLET | Freq: Two times a day (BID) | ORAL | Status: DC
  Administered 2012-08-25 – 2012-08-28 (×7): 40 meq via ORAL
  Filled 2012-08-25 (×8): qty 2

## 2012-08-25 NOTE — Progress Notes (Signed)
Physical Therapy Treatment Patient Details Name: [name] MRN: [id number] DOB: February 09, 1956 Today's Date: 08/25/2012 Time: 2956-2130 PT Time Calculation (min): 55 min  PT Assessment / Plan / Recommendation Comments on Treatment Session  Patient able to attempt steps this session but required increase time and mental prep as patient very anxious and fearful of having to put increased weight through R LE. Patient with decreased carry over of safety precautions and techniques given yesterday. Patient dozzing off during therex. RN made notfied of session. Patient not at safe level with goals set to discharge today. Will attempt steps and increased ambulation this afternoon.     Follow Up Recommendations  Home health PT;Supervision/Assistance - 24 hour     Does the patient have the potential to tolerate intense rehabilitation     Barriers to Discharge        Equipment Recommendations  Rolling walker with 5" wheels    Recommendations for Other Services    Frequency 7X/week   Plan Discharge plan remains appropriate;Frequency remains appropriate    Precautions / Restrictions Precautions Precautions: Knee Required Braces or Orthoses: Knee Immobilizer - Right Restrictions RLE Weight Bearing: Weight bearing as tolerated   Pertinent Vitals/Pain Patient with complaints of 10/10 R knee pain but also falling asleep with therex    Mobility  Bed Mobility Supine to Sit: 4: Min guard Sitting - Scoot to Edge of Bed: 4: Min guard Transfers Sit to Stand: 4: Min guard;With upper extremity assist;From chair/3-in-1 Stand to Sit: 4: Min guard;With upper extremity assist;To chair/3-in-1 Details for Transfer Assistance: Max cues for safe technique and hand placement Ambulation/Gait Ambulation/Gait Assistance: 4: Min assist Ambulation Distance (Feet): 30 Feet Assistive device: Rolling walker Ambulation/Gait Assistance Details: Patient limited by 10/10 pain. Patient with decreased step length  requiring VC and tactile cues to increase. Patient unsafe, taking hands off of RW with gait. Gait Pattern: Step-to pattern;Antalgic;Decreased step length - left;Decreased stance time - right Gait velocity: decreased Stairs: Yes Stairs Assistance: 3: Mod assist Stairs Assistance Details (indicate cue type and reason): A for RW and for balance. Cues for technique backwards as patient unable to attempt sideways. Patient very anxious about putting increased weight through R LE with steps Stair Management Technique: No rails;Step to pattern;Backwards;With walker Number of Stairs: 2     Exercises Total Joint Exercises Quad Sets: AROM;Right;10 reps Short Arc QuadBarbaraann Boys;Right;10 reps Heel Slides: AAROM;Right;10 reps Hip ABduction/ADduction: AAROM;Right;10 reps Straight Leg Raises: AAROM;Right;10 reps   PT Diagnosis:    PT Problem List:   PT Treatment Interventions:     PT Goals Acute Rehab PT Goals PT Goal: Supine/Side to Sit - Progress: Progressing toward goal PT Goal: Sit to Stand - Progress: Progressing toward goal PT Goal: Stand to Sit - Progress: Progressing toward goal PT Goal: Ambulate - Progress: Not progressing PT Goal: Up/Down Stairs - Progress: Progressing toward goal PT Goal: Perform Home Exercise Program - Progress: Progressing toward goal  Visit Information  Last PT Received On: 08/25/12 Assistance Needed: +1    Subjective Data      Cognition  Overall Cognitive Status: Impaired Area of Impairment: Awareness of deficits;Safety/judgement Arousal/Alertness: Lethargic Orientation Level: Appears intact for tasks assessed Behavior During Session: [facility] for tasks performed Safety/Judgement: Decreased awareness of safety precautions Awareness of Deficits: Patient very unsafe with steps but doesn't understand why she is unsafe to go home today.     Balance     End of Session PT - End of Session Equipment Utilized During Treatment: Gait  belt;Right knee immobilizer Activity  Tolerance: Patient limited by fatigue;Patient limited by pain Patient left: in chair;with call bell/phone within reach Nurse Communication: Mobility status   GP     Lennon, Richins 08/25/2012, 10:30 AM 08/25/2012 Fredrich Birks PTA [phone] pager [phone] office

## 2012-08-25 NOTE — Progress Notes (Signed)
Occupational Therapy Treatment Patient Details Name: [name] MRN: [id number] DOB: Sep 23, 1955 Today's Date: 08/25/2012 Time: 4098-1191 OT Time Calculation (min): 25 min  OT Assessment / Plan / Recommendation Comments on Treatment Session Pt demos change in functional ability today, pt demos lethargy and incerased c/o R knee pain    Follow Up Recommendations  No OT follow up    Barriers to Discharge       Equipment Recommendations  None recommended by OT    Recommendations for Other Services    Frequency Min 2X/week   Plan Discharge plan remains appropriate;Other (comment) (moniter pt progress for d/c plans)    Precautions / Restrictions Precautions Precautions: Knee Required Braces or Orthoses: Knee Immobilizer - Right Restrictions Weight Bearing Restrictions: Yes RLE Weight Bearing: Weight bearing as tolerated   Pertinent Vitals/Pain     ADL  Grooming: Performed;Wash/dry hands;Wash/dry face;Min guard Where Assessed - Grooming: Supported standing Toilet Transfer: Minimal assistance;Performed Statistician Method: Other (comment) (ambulating from RW level) Acupuncturist: Raised toilet seat with arms (or 3-in-1 over toilet) Toileting - Clothing Manipulation and Hygiene: Performed;Minimal assistance Where Assessed - Glass blower/designer Manipulation and Hygiene: Standing ADL Comments: pt lethargic and requring increased A with mobility and safety cues using RW. pt had not eaten her lunch upon entering room and stated that she had not eaten breakfast either. pt required increased time to ambulate from bed to bathroom toilet    OT Diagnosis:    OT Problem List:   OT Treatment Interventions:     OT Goals ADL Goals ADL Goal: Grooming - Progress: Other (comment) (decline in function today, cause unknown) ADL Goal: Toilet Transfer - Progress: Other (comment) (decline in function today, reason unknown) ADL Goal: Toileting - Clothing Manipulation - Progress: Other  (comment) (decline in function today, reason unknown) ADL Goal: Toileting - Hygiene - Progress: Other (comment) (decline in function today, reason unknown)  Visit Information  Last OT Received On: 08/25/12 Assistance Needed: +1 PT/OT Co-Evaluation/Treatment: Yes Reason Eval/Treat Not Completed: Fatigue/lethargy limiting ability to participate    Subjective Data  Subjective: " I am tired and my leg hurts " Patient Stated Goal: To return home   Prior Functioning       Cognition  Overall Cognitive Status: Impaired Area of Impairment: Safety/judgement;Awareness of errors;Awareness of deficits;Attention Arousal/Alertness: Lethargic Orientation Level: Appears intact for tasks assessed Behavior During Session: Lethargic Current Attention Level: Sustained Safety/Judgement: Decreased awareness of safety precautions;Decreased safety judgement for tasks assessed Cognition - Other Comments: pt does nt understand why she cannot d/c home today although she is very unsafe with mobility and lethargic    Mobility  Shoulder Instructions Bed Mobility Bed Mobility: Supine to Sit;Sitting - Scoot to Edge of Bed Supine to Sit: 4: Min guard Sitting - Scoot to [place] of Bed: 4: Min guard Sit to Supine: 4: Min guard Transfers Transfers: Sit to Stand;Stand to Sit Sit to Stand: From chair/3-in-1;From toilet;4: Min assist;With upper extremity assist;With armrests Stand to Sit: To chair/3-in-1;To toilet;With upper extremity assist;With armrests;4: Min assist Details for Transfer Assistance: pt required verbal cues for safety with hand placement and body positionig in relation to RW       Exercises      Balance Balance Balance Assessed: No   End of Session OT - End of Session Equipment Utilized During Treatment: Gait belt;Right knee immobilizer;Other (comment) (RW, 3 in 1 over toilet) Activity Tolerance: Patient limited by fatigue;Other (comment) (lethargic with decreased attention/safety) Patient  left: in chair;with call  bell/phone within reach  GO     [name] 08/25/2012, 3:22 PM

## 2012-08-25 NOTE — Progress Notes (Signed)
Patient ID: [name], female   DOB: 12/10/1955, 57 y.o.   MRN: [id number] PATIENT ID: [name]  MRN: [id number]  DOB/AGE:  13-Apr-1956 / 57 y.o.  2 Days Post-Op Procedure(s) (LRB): TOTAL KNEE ARTHROPLASTY (Right)    PROGRESS NOTE Subjective: Patient is alert, oriented, no Nausea, no Vomiting, yes passing gas, no Bowel Movement. Taking PO well. Denies SOB, Chest or Calf Pain. Using Incentive Spirometer, PAS in place. Ambulate unsafe and unsteady, CPM 0-60 Patient reports pain as 8 on 0-10 scale  .    Objective: Vital signs in last 24 hours: Filed Vitals:   08/24/12 2000 08/25/12 0000 08/25/12 0400 08/25/12 0549  BP:    105/58  Pulse:    70  Temp:    98.5 F (36.9 C)  TempSrc:    Oral  Resp: 18 18 18 18   SpO2: 96% 96% 96% 95%     coarse rales with a productive cough Wound well approximated mild redness   Intake/Output from previous day: I/O last 3 completed shifts: In: 720 [P.O.:720] Out: 400 [Urine:200; Drains:200]   Intake/Output this shift:     LABORATORY DATA:  Basename 08/25/12 0701 08/24/12 2133 08/24/12 1631 08/24/12 0605 08/23/12 1315  WBC -- -- -- 14.8* 14.1*  HGB -- -- -- 10.0* 11.1*  HCT -- -- -- 30.1* 34.5*  PLT -- -- -- 230 250  NA -- -- -- 138 --  K -- -- -- 3.7 --  CL -- -- -- 107 --  CO2 -- -- -- 20 --  BUN -- -- -- 8 --  CREATININE -- -- -- 0.64 0.67  GLUCOSE -- -- -- 139* --  GLUCAP 112* 121* 141* -- --  INR -- -- -- -- --  CALCIUM -- -- -- 8.6 --    Examination: ABD soft Neurovascular intact Sensation intact distally Intact pulses distally Dorsiflexion/Plantar flexion intact Incision: scant drainage}  Assessment:   2 Days Post-Op Procedure(s) (LRB): TOTAL KNEE ARTHROPLASTY (Right) ADDITIONAL DIAGNOSIS:  Acute Blood Loss Anemia Principal Problem:  *Right knee DJD Active Problems:  DIABETES MELLITUS II, UNCOMPLICATED  Unspecified vitamin D deficiency  HYPERLIPIDEMIA  OBESITY, NOS  BMI 33.8  ANXIETY  PANIC ATTACK  SLEEP APNEA,  OBSTRUCTIVE, MODERATE  MIGRAINE, COMMON, INTRACTABLE  GLAUCOMA  COPD GOLD 0  GASTROESOPHAGEAL REFLUX, NO ESOPHAGITIS  HERNIA, HIATAL, NONCONGENITAL  Irritable bowel syndrome  OSTEOPENIA  CONVULSIONS, SEIZURES, NOS  Plan: PT/OT WBAT, CPM 5/hrs day until ROM 0-90 degrees, DVT Prophylaxis:  SCDx72hrs, lovenox BID DISCHARGE PLAN: Home DISCHARGE NEEDS: HHPT, HHRN and Walker Patient needs pediatric walker at home.  Patient needs chest xray today due to high white count and chest congestion.  May go home today but most likely tomorrow due to unsafe mobiltiy and chest congestion.  She was hopitalized 3 months ago for pneumonia    Blyss Lugar J 08/25/2012, 7:31 AM

## 2012-08-25 NOTE — Progress Notes (Signed)
Physical Therapy Treatment Patient Details Name: [name] MRN: [id number] DOB: Jun 18, 1956 Today's Date: 08/25/2012 Time: 4098-1191 PT Time Calculation (min): 26 min  PT Assessment / Plan / Recommendation Comments on Treatment Session  Patient with decrease in functional mobility this session. Patient continues to state 10/10 pain although falling asleep throughout session if not actively walking. Patient with unsafe mobiilty and decrease safety awareness with all mobility. Did not attempt steps this session as would have been unsafe.     Follow Up Recommendations  Home health PT;Supervision/Assistance - 24 hour     Does the patient have the potential to tolerate intense rehabilitation     Barriers to Discharge        Equipment Recommendations  Rolling walker with 5" wheels    Recommendations for Other Services    Frequency 7X/week   Plan Discharge plan remains appropriate;Frequency remains appropriate    Precautions / Restrictions Precautions Precautions: Knee Required Braces or Orthoses: Knee Immobilizer - Right Restrictions RLE Weight Bearing: Weight bearing as tolerated   Pertinent Vitals/Pain 10/10 R knee pain    Mobility  Bed Mobility Supine to Sit: 4: Min guard Sitting - Scoot to Edge of Bed: 4: Min guard Transfers Sit to Stand: 4: Min assist;With upper extremity assist;From toilet;From bed Stand to Sit: 4: Min assist;With upper extremity assist;To toilet;To chair/3-in-1 Details for Transfer Assistance: Cues for safety and hand placement. A for stability as patient with increased unsteadiness this session Ambulation/Gait Ambulation/Gait Assistance: 4: Min assist Ambulation Distance (Feet): 30 Feet Assistive device: Rolling walker Ambulation/Gait Assistance Details: A for balance and stability. Patient dragging L foot this session with ambulation as it caused increased pain to shift weight onto R LE Gait Pattern: Antalgic;Decreased step length - left;Decreased  stance time - right;Step-to pattern;Trunk flexed Gait velocity: decreased    Exercises     PT Diagnosis:    PT Problem List:   PT Treatment Interventions:     PT Goals Acute Rehab PT Goals PT Goal: Supine/Side to Sit - Progress: Progressing toward goal PT Goal: Sit to Stand - Progress: Not progressing PT Goal: Stand to Sit - Progress: Not progressing PT Goal: Ambulate - Progress: Not progressing  Visit Information  Last PT Received On: 08/25/12 Assistance Needed: +1    Subjective Data      Cognition  Overall Cognitive Status: Impaired Area of Impairment: Safety/judgement;Awareness of deficits;Attention Arousal/Alertness: Lethargic Orientation Level: Appears intact for tasks assessed Behavior During Session: Lethargic Safety/Judgement: Decreased awareness of safety precautions;Decreased safety judgement for tasks assessed    Balance     End of Session PT - End of Session Equipment Utilized During Treatment: Gait belt Activity Tolerance: Patient limited by pain;Other (comment) (lethargia) Patient left: in chair;with call bell/phone within reach Nurse Communication: Mobility status   GP     Ilina, Xu 08/25/2012, 2:34 PM 08/25/2012 Fredrich Birks PTA [phone] pager [phone] office

## 2012-08-25 NOTE — Progress Notes (Signed)
Patient ID: [name], female   DOB: 1955/10/17, 57 y.o.   MRN: [id number] Chest xray shows upper lobe infiltrate and bronchitis.  Will treat this as hospital acquired pneumonia.  Order Vancomycin and Aztreonam per pharmacy consult.  Bruchy Mikel A. Gwinda Passe Physician Assistant [facility] [phone]  08/25/2012, 10:20 AM

## 2012-08-25 NOTE — Progress Notes (Signed)
Patient needs an IV restart. Iv nurse informed RN that it would take awhile. RN told RN patient starting Iv antibiotics and that it needed to be done as soon as possible. Thank you

## 2012-08-25 NOTE — Progress Notes (Signed)
ANTIBIOTIC CONSULT NOTE - INITIAL  Pharmacy Consult for Vancomycin, Aztreonam Indication: Suspected HCAP  Patient Measurements: Height: 5\' 2"  (157.5 cm) Weight: 184 lb 3.2 oz (83.553 kg) IBW/kg (Calculated) : 50.1   Vital Signs: Temp: 98.5 F (36.9 C) (01/15 0549) Temp src: Oral (01/15 0549) BP: 105/58 mmHg (01/15 0549) Pulse Rate: 70  (01/15 0549) Intake/Output from previous day: 01/14 0701 - 01/15 0700 In: 720 [P.O.:720] Out: 200 [Urine:200] Intake/Output from this shift:    Labs:  [facility] 08/25/12 0745 08/24/12 0605 08/23/12 1315  WBC 17.3* 14.8* 14.1*  HGB 9.5* 10.0* 11.1*  PLT 211 230 250  LABCREA -- -- --  CREATININE 0.73 0.64 0.67    Assessment: Ms. Neace is s/p R TKA 1/13, now with leukocytosis and CXR c/w PNA. Noted patient is afebrile. Hx of severe allergic reaction to PCN noted. Patient admitted ~ 48 h ago, and admitted to the hospital within the last 30 days, so is at risk for MDR organisms/infections. No cx data noted.  Goal of Therapy:  Vancomycin trough level 15-20 mcg/ml  Plan:  - Aztreonam 1gm  IV q8h, start now - Vancomycin 1gm IV q 12h, give after aztreonam - Will monitor cx/spec/sens, renal fn and clinical status daily. - Will f/up to check Css vanc trough as appropriate  Thanks, Dyamond Tolosa K. Allena Katz, PharmD, BCPS.  Clinical Pharmacist Pager [phone]. 08/25/2012 10:58 AM

## 2012-08-26 DIAGNOSIS — Z8701 Personal history of pneumonia (recurrent): Secondary | ICD-10-CM

## 2012-08-26 DIAGNOSIS — J189 Pneumonia, unspecified organism: Secondary | ICD-10-CM | POA: Diagnosis not present

## 2012-08-26 LAB — GLUCOSE, CAPILLARY: Glucose-Capillary: 93 mg/dL (ref 70–99)

## 2012-08-26 LAB — CBC
HCT: 26.5 % — ABNORMAL LOW (ref 36.0–46.0)
Hemoglobin: 8.8 g/dL — ABNORMAL LOW (ref 12.0–15.0)
MCH: 31.5 pg (ref 26.0–34.0)
MCHC: 33.2 g/dL (ref 30.0–36.0)
MCV: 95 fL (ref 78.0–100.0)

## 2012-08-26 LAB — BASIC METABOLIC PANEL
BUN: 11 mg/dL (ref 6–23)
Creatinine, Ser: 0.62 mg/dL (ref 0.50–1.10)
GFR calc non Af Amer: >90 mL/min (ref >90)
Glucose, Bld: 116 mg/dL — ABNORMAL HIGH (ref 70–99)
Potassium: 3.9 mEq/L (ref 3.5–5.1)

## 2012-08-26 MED ORDER — VITAMIN D3 25 MCG (1000 UNIT) PO TABS
2000.0000 [IU] | ORAL_TABLET | Freq: Every day | ORAL | Status: DC
  Administered 2012-08-26 – 2012-09-06 (×11): 2000 [IU] via ORAL
  Filled 2012-08-26 (×12): qty 2

## 2012-08-26 MED ORDER — TIOTROPIUM BROMIDE MONOHYDRATE 18 MCG IN CAPS
18.0000 ug | ORAL_CAPSULE | Freq: Every day | RESPIRATORY_TRACT | Status: DC
  Administered 2012-08-28 – 2012-09-06 (×9): 18 ug via RESPIRATORY_TRACT
  Filled 2012-08-26 (×4): qty 5

## 2012-08-26 MED ORDER — PNEUMOCOCCAL VAC POLYVALENT 25 MCG/0.5ML IJ INJ
0.5000 mL | INJECTION | INTRAMUSCULAR | Status: AC
  Filled 2012-08-26: qty 0.5

## 2012-08-26 MED ORDER — ALBUTEROL SULFATE (5 MG/ML) 0.5% IN NEBU: 2.5000 mg | INHALATION_SOLUTION | RESPIRATORY_TRACT | Status: DC | PRN

## 2012-08-26 MED ORDER — IPRATROPIUM BROMIDE 0.02 % IN SOLN
0.5000 mg | Freq: Once | RESPIRATORY_TRACT | Status: AC
  Administered 2012-08-26: 0.5 mg via RESPIRATORY_TRACT
  Filled 2012-08-26: qty 2.5

## 2012-08-26 NOTE — Progress Notes (Signed)
Pharmacy note re: Tudorza (Aclidinium) inhaler   Carlos [employer] 1 puff BID ordered on admission from home med list. Non-formulary at [facility], Rx asked for patient's home supply to be brought in on admission 1/13, though patient dose not recall being asked.  Patient reports using Spiriva inhaler once daily at home, and not [place].  Prescriber gave her the option of either med, and she reports using Spiriva at home.  Respiratory therapist here for Albuterol treatment; we discussed.  Tudorza changed to home Spiriva 1 puff daily, to begin 1/17 am.  Atrovent nebs x 1 this afternoon.  Nicolette Bang, RPh Pager: [phone] 08/26/2012

## 2012-08-26 NOTE — Progress Notes (Signed)
Seen and agreed 08/26/2012 Robinette, Julia Elizabeth PTA [phone] pager [phone] office    

## 2012-08-26 NOTE — Progress Notes (Signed)
Seen and agreed 08/26/2012 Fredrich Birks PTA [phone] pager [phone] office

## 2012-08-26 NOTE — Progress Notes (Signed)
Physical Therapy Treatment Patient Details Name: [name] MRN: [id number] DOB: 05-26-1956 Today's Date: 08/26/2012 Time: 4098-1191 PT Time Calculation (min): 30 min  PT Assessment / Plan / Recommendation Comments on Treatment Session  Overall increase in patient participation and functioning including gait, safety awreness and and mobility this session. Patient tolerated increased distance with gait and added exercises. She is eager to try steps later today. Will attempt next session when husband is present.    Follow Up Recommendations  Home health PT     Does the patient have the potential to tolerate intense rehabilitation     Barriers to Discharge        Equipment Recommendations  Rolling walker with 5" wheels    Recommendations for Other Services    Frequency 7X/week   Plan Discharge plan remains appropriate;Frequency remains appropriate    Precautions / Restrictions Precautions Precautions: Knee Restrictions RLE Weight Bearing: Weight bearing as tolerated       Mobility  Transfers Sit to Stand: 5: Supervision Stand to Sit: 5: Supervision Ambulation/Gait Ambulation/Gait Assistance: 4: Min guard Ambulation Distance (Feet): 200 Feet Assistive device: Rolling walker Ambulation/Gait Assistance Details: Cueing for upright posture. Increased quality in gait pattern. Gait Pattern: Step-to pattern;Trunk flexed    Exercises Total Joint Exercises Quad Sets: AROM;Both;15 reps Short Arc Quad: AROM;Right;15 reps Heel Slides: AAROM;Right;15 reps Hip ABduction/ADduction: AROM;Right;15 reps Straight Leg Raises: AAROM;AROM;Right;15 reps Long Arc Quad: AROM;Seated;15 reps;Right   PT Diagnosis:    PT Problem List:   PT Treatment Interventions:     PT Goals Acute Rehab PT Goals PT Goal: Sit to Stand - Progress: Met PT Goal: Stand to Sit - Progress: Met PT Goal: Ambulate - Progress: Progressing toward goal PT Goal: Perform Home Exercise Program - Progress: Progressing  toward goal  Visit Information  Last PT Received On: 08/26/12 Assistance Needed: +1    Subjective Data      Cognition  Overall Cognitive Status: Appears within functional limits for tasks assessed/performed Behavior During Session: [facility] for tasks performed    Balance     End of Session PT - End of Session Equipment Utilized During Treatment: Gait belt Activity Tolerance: Patient tolerated treatment well Patient left: in chair;with call bell/phone within reach   GP     Lazaro Arms 08/26/2012, 9:37 AM

## 2012-08-26 NOTE — Progress Notes (Signed)
Subjective: 3 Days Post-Op Procedure(s) (LRB): TOTAL KNEE ARTHROPLASTY (Right) Patient reports pain as 6 on 0-10 scale.   Still has productive cough Objective: Vital signs in last 24 hours: Temp:  [98 F (36.7 C)-98.7 F (37.1 C)] 98.7 F (37.1 C) (01/16 0600) Pulse Rate:  [100-102] 102  (01/16 0600) Resp:  [16-18] 16  (01/16 0600) BP: (100-106)/(50-63) 100/50 mmHg (01/16 0600) SpO2:  [95 %-100 %] 100 % (01/16 0600) Weight:  [83.553 kg (184 lb 3.2 oz)] 83.553 kg (184 lb 3.2 oz) (01/15 1000)  Intake/Output from previous day: 01/15 0701 - 01/16 0700 In: 460 [P.O.:460] Out: -  Intake/Output this shift:     Basename 08/26/12 0720 08/25/12 0745 08/24/12 0605 08/23/12 1315  HGB 8.8* 9.5* 10.0* 11.1*    Basename 08/26/12 0720 08/25/12 0745  WBC 17.9* 17.3*  RBC 2.79* 2.96*  HCT 26.5* 28.3*  PLT 207 211    Basename 08/25/12 0745 08/24/12 0605  NA 139 138  K 3.2* 3.7  CL 105 107  CO2 24 20  BUN 12 8  CREATININE 0.73 0.64  GLUCOSE 109* 139*  CALCIUM 8.8 8.6   No results found for this basename: LABPT:2,INR:2 in the last 72 hours  ABD soft Neurovascular intact Sensation intact distally Intact pulses distally Dorsiflexion/Plantar flexion intact Incision: dressing C/D/I No cellulitis present chest still has coarse rales with some expiratory wheezes  Assessment/Plan: 3 Days Post-Op Procedure(s) (LRB): TOTAL KNEE ARTHROPLASTY (Right) Principal Problem:  *Right knee DJD Active Problems:  DIABETES MELLITUS II, UNCOMPLICATED  Unspecified vitamin D deficiency  HYPERLIPIDEMIA  OBESITY, NOS  BMI 33.8  ANXIETY  PANIC ATTACK  SLEEP APNEA, OBSTRUCTIVE, MODERATE  MIGRAINE, COMMON, INTRACTABLE  GLAUCOMA  COPD GOLD 0  GASTROESOPHAGEAL REFLUX, NO ESOPHAGITIS  HERNIA, HIATAL, NONCONGENITAL  Irritable bowel syndrome  OSTEOPENIA  CONVULSIONS, SEIZURES, NOS  Hospital-acquired pneumonia  Hx of bacterial pneumonia  Continue IV antiobiotics until lungs are  clear. Continue Physical therapy for ambulation Discharge when pneumonia is resolved  [name] J 08/26/2012, 7:56 AM

## 2012-08-26 NOTE — Progress Notes (Signed)
Physical Therapy Treatment Patient Details Name: [name] MRN: [id number] DOB: 1956/08/04 Today's Date: 08/26/2012 Time: 4098-1191 PT Time Calculation (min): 41 min  PT Assessment / Plan / Recommendation Comments on Treatment Session  Good return demonstration with steps from patient and husband.  Insturction and handout given to patient with exercises and how to  Ascend/Descend Steps with RW.     Follow Up Recommendations  Home health PT     Does the patient have the potential to tolerate intense rehabilitation     Barriers to Discharge        Equipment Recommendations  Rolling walker with 5" wheels    Recommendations for Other Services    Frequency 7X/week   Plan Discharge plan remains appropriate;Frequency remains appropriate    Precautions / Restrictions Precautions Precautions: Knee Restrictions RLE Weight Bearing: Weight bearing as tolerated   Pertinent Vitals/Pain 8/10. Repositioned. Cold Applied.    Mobility  Bed Mobility Supine to Sit: 4: Min guard Sitting - Scoot to Edge of Bed: 4: Min guard Sit to Supine: 4: Min guard Details for Bed Mobility Assistance: cueing for hand placement and technique Transfers Sit to Stand: 5: Supervision;With upper extremity assist;From bed;From toilet Stand to Sit: 5: Supervision;To bed;To toilet Ambulation/Gait Ambulation/Gait Assistance: 4: Min guard Ambulation Distance (Feet): 100 Feet Assistive device: Rolling walker Ambulation/Gait Assistance Details: Patient begins with upright posture but increases trunk flexion with distance. Able to correct posture with min cueing. Gait Pattern: Step-to pattern;Trunk flexed Gait velocity: increase with distance Stairs Assistance: 4: Min assist Stairs Assistance Details (indicate cue type and reason): A  and cueing with RW management and gait sequence Stair Management Technique: Backwards Number of Stairs: 2     Exercises Total Joint Exercises Quad Sets: AROM;Both;15  reps;Supine Short Arc Quad: AROM;Right;15 reps;Supine Heel Slides: AROM;Right;15 reps;Supine Hip ABduction/ADduction: AROM;Right;15 reps;Supine Straight Leg Raises: AROM;Right;15 reps;Supine Long Arc Quad: AROM;Right;Seated   PT Diagnosis:    PT Problem List:   PT Treatment Interventions:     PT Goals Acute Rehab PT Goals PT Goal: Supine/Side to Sit - Progress: Progressing toward goal PT Goal: Sit to Supine/Side - Progress: Progressing toward goal PT Goal: Sit to Stand - Progress: Met PT Goal: Stand to Sit - Progress: Met PT Goal: Ambulate - Progress: Progressing toward goal PT Goal: Up/Down Stairs - Progress: Met PT Goal: Perform Home Exercise Program - Progress: Progressing toward goal  Visit Information  Last PT Received On: 08/26/12 Assistance Needed: +1    Subjective Data      Cognition  Overall Cognitive Status: Appears within functional limits for tasks assessed/performed Arousal/Alertness: Awake/alert Orientation Level: Appears intact for tasks assessed Behavior During Session: [facility] for tasks performed    Balance     End of Session PT - End of Session Equipment Utilized During Treatment: Gait belt Activity Tolerance: Patient tolerated treatment well Patient left: in bed;with call bell/phone within reach   GP     Lazaro Arms 08/26/2012, 2:04 PM

## 2012-08-26 NOTE — Progress Notes (Signed)
Utilization review completed. Darian Ace, RN, BSN. 

## 2012-08-27 ENCOUNTER — Inpatient Hospital Stay (HOSPITAL_COMMUNITY): Payer: Medicaid Other

## 2012-08-27 DIAGNOSIS — D62 Acute posthemorrhagic anemia: Secondary | ICD-10-CM | POA: Diagnosis not present

## 2012-08-27 LAB — GLUCOSE, CAPILLARY
Glucose-Capillary: 129 mg/dL — ABNORMAL HIGH (ref 70–99)
Glucose-Capillary: 139 mg/dL — ABNORMAL HIGH (ref 70–99)

## 2012-08-27 LAB — CBC WITH DIFFERENTIAL/PLATELET
Basophils Relative: 0 % (ref 0–1)
Hemoglobin: 8.6 g/dL — ABNORMAL LOW (ref 12.0–15.0)
MCHC: 33 g/dL (ref 30.0–36.0)
Monocytes Relative: 5 % (ref 3–12)
Neutro Abs: 17.6 10*3/uL — ABNORMAL HIGH (ref 1.7–7.7)
Neutrophils Relative %: 91 % — ABNORMAL HIGH (ref 43–77)
RBC: 2.78 MIL/uL — ABNORMAL LOW (ref 3.87–5.11)

## 2012-08-27 LAB — COMPREHENSIVE METABOLIC PANEL
ALT: 23 U/L (ref 0–35)
AST: 31 U/L (ref 0–37)
Albumin: 2.5 g/dL — ABNORMAL LOW (ref 3.5–5.2)
Alkaline Phosphatase: 142 U/L — ABNORMAL HIGH (ref 39–117)
BUN: 10 mg/dL (ref 6–23)
Chloride: 105 mEq/L (ref 96–112)
Potassium: 4.1 mEq/L (ref 3.5–5.1)
Sodium: 140 mEq/L (ref 135–145)
Total Bilirubin: 0.4 mg/dL (ref 0.3–1.2)

## 2012-08-27 MED ORDER — IOHEXOL 350 MG/ML SOLN
80.0000 mL | Freq: Once | INTRAVENOUS | Status: AC | PRN
  Administered 2012-08-27: 80 mL via INTRAVENOUS

## 2012-08-27 MED ORDER — LEVOFLOXACIN IN D5W 750 MG/150ML IV SOLN
750.0000 mg | INTRAVENOUS | Status: DC
  Administered 2012-08-27 – 2012-09-01 (×6): 750 mg via INTRAVENOUS
  Filled 2012-08-27 (×8): qty 150

## 2012-08-27 NOTE — Progress Notes (Signed)
Physical Therapy Treatment Patient Details Name: [name] MRN: [id number] DOB: [birth year] Today's Date: 08/27/2012 Time: 2841-3244 PT Time Calculation (min): 18 min  PT Assessment / Plan / Recommendation Comments on Treatment Session  Pt continues to make steady progress with her mobility with incr gait distance today and less assistance/cues needed with mobiltiy today.    Follow Up Recommendations  Home health PT           Equipment Recommendations  Rolling walker with 5" wheels       Frequency 7X/week   Plan Discharge plan remains appropriate;Frequency remains appropriate    Precautions / Restrictions Precautions Precautions: Knee Required Braces or Orthoses: Knee Immobilizer - Right Knee Immobilizer - Right: Discontinue once straight leg raise with < 10 degree lag Restrictions RLE Weight Bearing: Weight bearing as tolerated    Pertinent Vitals/Pain 3/10 in right knee, premedicated.    Mobility  Bed Mobility Supine to Sit: 6: Modified independent (Device/Increase time);HOB flat Sitting - Scoot to Edge of Bed: 6: Modified independent (Device/Increase time) Details for Bed Mobility Assistance: no cues or assistance needed with bed mobility with bed flat and no rails. Transfers Sit to Stand: 6: Modified independent (Device/Increase time);From bed;Without upper extremity assist Stand to Sit: 6: Modified independent (Device/Increase time);To chair/3-in-1;With armrests;With upper extremity assist;To bed Details for Transfer Assistance: no cues or assistance needed. repeated sit/stand transfer x2 for walker adjustment. Ambulation/Gait Ambulation/Gait Assistance: 5: Supervision Ambulation Distance (Feet): 130 Feet Assistive device: Rolling walker Ambulation/Gait Assistance Details: occasional verbal cues for walker postion with gait and for posture.  Gait Pattern: Step-through pattern;Decreased step length - right;Decreased stance time - left;Antalgic    Exercises Total  Joint Exercises Ankle Circles/Pumps: AROM;Both;10 reps Quad Sets: AROM;Strengthening;Right;10 reps;Supine Heel Slides: AROM;Strengthening;Right;10 reps;Supine Hip ABduction/ADduction: AROM;Strengthening;Right;10 reps;Supine Straight Leg Raises: AROM;Strengthening;Right;10 reps;Supine     PT Goals Acute Rehab PT Goals PT Goal: Supine/Side to Sit - Progress: Met PT Goal: Sit to Stand - Progress: Met PT Goal: Stand to Sit - Progress: Met PT Goal: Ambulate - Progress: Progressing toward goal PT Goal: Perform Home Exercise Program - Progress: Progressing toward goal  Visit Information  Last PT Received On: 08/27/12    Subjective Data  Subjective: No new complaints, agreeable to therapy at this time.   Cognition  Overall Cognitive Status: Appears within functional limits for tasks assessed/performed Arousal/Alertness: Awake/alert Orientation Level: Appears intact for tasks assessed Behavior During Session: [facility] for tasks performed       End of Session PT - End of Session Equipment Utilized During Treatment: Gait belt Activity Tolerance: Patient tolerated treatment well Patient left: in chair;with call bell/phone within reach Nurse Communication: Mobility status   GP     Sallyanne Kuster 08/27/2012, 1:46 PM  Sallyanne Kuster, PTA Office- [phone]

## 2012-08-27 NOTE — Consult Note (Signed)
PULMONARY  / CRITICAL CARE MEDICINE  Name: [name] MRN: [id number] DOB: [birth year]    LOS: 4  REFERRING PROVIDER:  [facility]  CHIEF COMPLAINT:  57 y/o F with PMH of   BRIEF PATIENT DESCRIPTION: 57 y/o F with PMH DM, Epilepsy, IBS, HTN, HLD, GERD, esophageal stricture, COPD with ongoing tobacco abuse, admit in 04/2012 for PNA.  Admitted on 1/13 for R total knee replacement.  Post replacement had abnormal cxr with bilateral diffuse patchy airspace disease.  Rx'd with aztreonam, vanco and continued to have fevers.  CT evaluated with bilateral patchy diffuse airspace disease. PCCM consulted for evaluation. Followed by Dr. Sherene Sires in office.   LINES / TUBES:   CULTURES: 1/17 Sputum>>>  ANTIBIOTICS: Aztreonam 1/15>>> Vanco 1/15>>> Levaquin 1/17>>>  SIGNIFICANT EVENTS:  1/13 - admit for R total knee 1/15 - post op cxr with bilateral airspace disease, rx'd with aztreonam / vanco 1/17 - CT with diffuse patchy bilateral airspace, PCCM consultation   HISTORY OF PRESENT ILLNESS:  57 y/o F with PMH DM, Epilepsy, IBS, HTN, HLD, GERD, esophageal stricture, COPD with ongoing tobacco abuse, admit in 04/2012 for PNA.  Admitted on 1/13 for R total knee replacement.  Post replacement had abnormal cxr with bilateral diffuse patchy airspace disease.  Rx'd with aztreonam, vanco and continued to have fevers.  CT evaluated with bilateral patchy diffuse airspace disease.    PAST MEDICAL HISTORY :  Past Medical History  Diagnosis Date  . Allergic rhinitis   . Hypertension   . Hyperlipidemia   . GERD (gastroesophageal reflux disease)   . Glaucoma(365)   . Neck pain     Cervical disc  . IBS (irritable bowel syndrome)   . Esophageal stricture   . Diverticulosis   . Right leg DVT 1982    groin  . COPD (chronic obstructive pulmonary disease)   . Emphysema   . Pneumonia 2012; 04/27/2012  . Chronic bronchitis     "keep it all the time" (04/27/2012)  . Exertional dyspnea   . Type II  diabetes mellitus     No meds since weight loss  . Migraine   . Epilepsy   . DJD (degenerative joint disease)   . Arthritis     "hands and legs and feet" (04/27/2012)  . Depression   . OSA on CPAP     6-7 yrs  . UTI (urinary tract infection)     2 weeks ago  . Cancer 05    rt arm mole rem cancer   Past Surgical History  Procedure Date  . Tubal ligation 2001  . Knee arthroscopy 1990's    left  . Carpal tunnel release ~ 2008    right  . Shoulder open rotator cuff repair ~ 2010    left  . Abdominal hysterectomy 2001    "partial"  . Total knee arthroplasty 08/23/2012    right  . Total knee arthroplasty 08/23/2012    Procedure: TOTAL KNEE ARTHROPLASTY;  Surgeon: Nilda Simmer, MD;  Location: [facility] OR;  Service: Orthopedics;  Laterality: Right;  RIGHT KNEE ARTHROPLASTY MEDIAL AND LATERAL COMPARTMENTS WITH PATELLA RESURFACING   Prior to Admission medications   Medication Sig Start Date End Date Taking? Authorizing Provider  acetaminophen (TYLENOL) 325 MG tablet Take 975 mg by mouth every 6 (six) hours as needed. For pain   Yes Historical Provider, MD  albuterol (PROAIR HFA) 108 (90 BASE) MCG/ACT inhaler Inhale 2 puffs into the lungs every 6 (six) hours as  needed. Shortness of breath   Yes Historical Provider, MD  albuterol (PROVENTIL) (2.5 MG/3ML) 0.083% nebulizer solution Take 2.5 mg by nebulization every 6 (six) hours as needed. For shortness of breath   Yes Historical Provider, MD  Cholecalciferol (VITAMIN D3) 2000 UNITS TABS Take 1 tablet by mouth daily. 04/06/12  Yes Everrett Coombe, DO  dicyclomine (BENTYL) 20 MG tablet Take 1 tablet (20mg ) by mouth four times daily- Before meals and at bedtime 07/06/12  Yes Everrett Coombe, DO  fluticasone (FLONASE) 50 MCG/ACT nasal spray Place 2 sprays into the nose daily as needed. For congestion.   Yes Historical Provider, MD  HYDROcodone-acetaminophen (NORCO/VICODIN) 5-325 MG per tablet Take 1 tablet by mouth daily as needed for pain. 07/07/12  Yes  Latrelle Dodrill, MD  hydrocortisone (ANUSOL-HC) 25 MG suppository Place 1 suppository (25 mg total) rectally 2 (two) times daily. 08/12/12  Yes Tobey Grim, MD  hydrocortisone cream 1 % Apply to affected area 2 times daily 08/05/12  Yes Robyn Judeth Cornfield, PA-C  lamoTRIgine (LAMICTAL) 150 MG tablet Take 1 tablet (150 mg total) by mouth 2 (two) times daily. 07/06/12  Yes Everrett Coombe, DO  omeprazole (PRILOSEC) 40 MG capsule Take 40 mg by mouth 2 (two) times daily. 02/17/12  Yes Everrett Coombe, DO  QUEtiapine (SEROQUEL) 50 MG tablet Take 50 mg by mouth at bedtime. 1 tab pm for headaches   Yes Historical Provider, MD  simvastatin (ZOCOR) 40 MG tablet Take 1 tablet (40 mg total) by mouth at bedtime. 06/17/12  Yes Jacquelyn A McGill, MD  tiotropium (SPIRIVA) 18 MCG inhalation capsule Place 18 mcg into inhaler and inhale daily.   Yes Historical Provider, MD  topiramate (TOPAMAX) 100 MG tablet Take 100 mg by mouth 2 (two) times daily.     Yes Historical Provider, MD  varenicline (CHANTIX) 1 MG tablet Take 1 tablet (1 mg total) by mouth 2 (two) times daily. 06/24/12  Yes Everrett Coombe, DO  Ascorbic Acid (VITAMIN C) 100 MG tablet Take 100 mg by mouth daily.    Historical Provider, MD  Blood Glucose Monitoring Suppl (BLOOD GLUCOSE METER) kit Use to check blood glucose daily.  Accuchek preffered by patient 06/24/12   Everrett Coombe, DO  Glucose Blood (BLOOD GLUCOSE TEST STRIPS) STRP Use to test blood glucose daily. 04/06/12   Everrett Coombe, DO  hydrOXYzine (ATARAX/VISTARIL) 50 MG tablet Take 25 mg by mouth every 6 (six) hours as needed.    Historical Provider, MD  loratadine (CLARITIN) 10 MG tablet Take 10 mg by mouth as needed.    Historical Provider, MD  meloxicam (MOBIC) 7.5 MG tablet Take 7.5 mg by mouth daily. 04/06/12   Everrett Coombe, DO   Allergies  Allergen Reactions  . Amoxicillin Anaphylaxis  . Azithromycin Swelling and Rash    "swelling all over"  . Doxycycline Hyclate Anaphylaxis and Rash  .  Penicillins Shortness Of Breath and Rash    "almost died"  . Sulfonamide Derivatives Rash  . Chantix (Varenicline) Other (See Comments)    "bad dreams"  . Clarithromycin Hives  . Fluticasone-Salmeterol Other (See Comments)    REACTION: ulcers in mouth  . Nortriptyline Other (See Comments)    Per patient had "kidney problems" on this medication     FAMILY HISTORY:  Family History  Problem Relation Age of Onset  . Heart disease Maternal Grandfather   . Heart disease      uncle  . Diabetes Maternal Grandfather   . Breast cancer Maternal Aunt   .  Alcohol abuse Father   . Bipolar disorder Son   . Muscular dystrophy Son   . Diabetes      aunts  . Diabetes      uncles  . Heart disease Sister   . Lung cancer Father     was a smoker  . Colon cancer Neg Hx   . Lung cancer Mother     was a smoker  . Other Sister     blood clotting disorder  . Stroke Mother   . Heart disease Son   . Emphysema Father     was a smoker   SOCIAL HISTORY:  reports that she has been smoking Cigarettes.  She started smoking about 37 years ago. She has a 36 pack-year smoking history. She has never used smokeless tobacco. She reports that she does not drink alcohol or use illicit drugs.  REVIEW OF SYSTEMS:   Constitutional: Negative for fever, chills,malaise/fatigue and diaphoresis. Reports over past two years weight loss from 320 to 187 without effort, night sweats (feels she is menopausal) HENT: Negative for hearing loss, ear pain, nosebleeds, congestion, sore throat, neck pain, tinnitus and ear discharge.   Eyes: Negative for blurred vision, double vision, photophobia, pain, discharge and redness.  Respiratory: Negative for hemoptysis, shortness of breath, wheezing and stridor.  Reports cough, daily sputum production Cardiovascular: Negative for chest pain, palpitations, orthopnea, claudication, leg swelling and PND.  Gastrointestinal: Negative for heartburn, nausea, vomiting, abdominal pain,  diarrhea, constipation, blood in stool and melena.  Genitourinary: Negative for dysuria, urgency, frequency, hematuria and flank pain.  Musculoskeletal: Negative for myalgias, back pain, joint pain and falls.  Skin: Negative for itching and rash.  Neurological: Negative for dizziness, tingling, tremors, sensory change, speech change, focal weakness, seizures, loss of consciousness, weakness and headaches.  Endo/Heme/Allergies: Negative for environmental allergies and polydipsia. Does not bruise/bleed easily.  INTERVAL HISTORY: Up in bed, no distress.   VITAL SIGNS: Temp:  [98 F (36.7 C)-102.7 F (39.3 C)] 98 F (36.7 C) (01/17 1355) Pulse Rate:  [81-101] 98  (01/17 1355) Resp:  [18-20] 20  (01/17 1355) BP: (105-129)/(59-66) 108/59 mmHg (01/17 1355) SpO2:  [91 %-98 %] 91 % (01/17 1535)  PHYSICAL EXAMINATION: General:  Obese female in NAD Neuro:  AAOx4, speech clear, MAE HEENT:  Mm pink/moist, no LAN Cardiovascular:  s1s2 rrr, no m/r/g Lungs:  resp's even/non-labored, lungs bilaterally with faint crackles Abdomen:  Obese/soft, bsx4 active Musculoskeletal: no acute deformities, R knee dressing c/d/i Skin:  Warm/dry, no edema   Lab 08/27/12 1017 08/26/12 0720 08/25/12 0745  NA 140 139 139  K 4.1 3.9 3.2*  CL 105 107 105  CO2 22 21 24   BUN 10 11 12   CREATININE 0.60 0.62 0.73  GLUCOSE 103* 116* 109*    Lab 08/27/12 1017 08/26/12 0720 08/25/12 0745  HGB 8.6* 8.8* 9.5*  HCT 26.1* 26.5* 28.3*  WBC 19.3* 17.9* 17.3*  PLT 211 207 211   Dg Chest 2 View  08/27/2012  *RADIOLOGY REPORT*  Clinical Data: Worsening cough, fever, evaluate for pneumonia  CHEST - 2 VIEW  Comparison: 08/25/2012; 04/25/2012; chest CT - 04/25/2012  Findings: Grossly unchanged cardiac silhouette and mediastinal contours.  Interval development/worsening of bilateral mid lung predominant heterogeneous, slightly reticular opacities. No pleural effusion or pneumothorax.  Unchanged bones.  IMPRESSION: Interval  development/worsening of bilateral mid lung predominant opacities which given history of fever and cough is worrisome for multifocal infection, including atypical etiologies.  A follow-up chest radiograph in 4 to  6 weeks after treatment is recommended to ensure resolution.   Original Report Authenticated By: Tacey Ruiz, MD    Ct Angio Chest Pe W/cm &/or Wo Cm  08/27/2012  *RADIOLOGY REPORT*  Clinical Data: rule out PE  CT ANGIOGRAPHY CHEST  Technique:  Multidetector CT imaging of the chest using the standard protocol during bolus administration of intravenous contrast. Multiplanar reconstructed images including MIPs were obtained and reviewed to evaluate the vascular anatomy.  Contrast: 80mL OMNIPAQUE IOHEXOL 350 MG/ML SOLN  Comparison: 04/25/2012  Findings: There is no pleural effusion.  Multifocal areas of ground- glass attenuation and consolidation are noted.  Tracheobronchial tree appears patent.  Normal heart size.  No pericardial effusion.  No mediastinal or hilar adenopathy identified.  No abnormal filling defects identified within the main pulmonary artery or their branches to suggest an acute pulmonary embolus.  Imaging through the upper abdomen is unremarkable.  Review of the visualized osseous structures is significant for mild spondylosis.  IMPRESSION:  1.  No evidence for acute pulmonary embolus. 2.  Bilateral multifocal airspace opacities are identified compatible with multifocal infection.   Original Report Authenticated By: Signa Kell, M.D.     ASSESSMENT / PLAN: Fever COPD Acute Bilateral Pulmonary Infiltrates - acute bilateral infiltrates with reported history of difficulties swallowing, GERD, and esophageal strictures.  Hx of smoking.  Negative CTA for PE.  No reported difficulties with intubation per Anesthesia note.  Concern for aspiration given CT picture.   Peri operative aspiration? PLAN: -PPI - on 80 mg protonix as equivalent for omeprazole 40 BID -ESR, C3, C4, ANA,  unlikely auto immune, will send labs -discontinue decadron -sputum culture -pulmonary hygiene -caution with narc's given pulmonary hx -add levaquin -continue vanco, aztreonam -continue flonase, spiriva -pcxr in am  -pulse ox   R Total Knee -Per Orthro   PCCM will take on Service as primary.  Ortho to continue to consult for Knee / post op care  [name], NP-C Pulmonary and Critical Care Medicine [facility] Pager: [phone]  08/27/2012, 4:11 PM  I have fully examined this patient and agree with above findings.    And edite din full  [name]. [name], MD, FACP Pgr: [phone] [facility]

## 2012-08-27 NOTE — Progress Notes (Signed)
PT Cancellation Note  Patient Details Name: [name] MRN: [id number] DOB: 09/17/1955   Cancelled Treatment:   PT on way to radiology for stat CT to r/o PE and follow up to PNA. Will check back when returns to floor.  Sallyanne Kuster 08/27/2012, 11:27 AM  Sallyanne Kuster, PTA Office- [phone]

## 2012-08-27 NOTE — Progress Notes (Signed)
Subjective: 4 Days Post-Op Procedure(s) (LRB): TOTAL KNEE ARTHROPLASTY (Right) Patient reports pain as 5 on 0-10 scale.    Objective: Vital signs in last 24 hours: Temp:  [98 F (36.7 C)-102.7 F (39.3 C)] 98 F (36.7 C) (01/17 1355) Pulse Rate:  [81-101] 98  (01/17 1355) Resp:  [18-20] 20  (01/17 1355) BP: (105-129)/(59-66) 108/59 mmHg (01/17 1355) SpO2:  [91 %-98 %] 91 % (01/17 1535)  Intake/Output from previous day: 01/16 0701 - 01/17 0700 In: 750 [P.O.:510; I.V.:240] Out: -  Intake/Output this shift: Total I/O In: 600 [P.O.:600] Out: -    Basename 08/27/12 1017 08/26/12 0720 08/25/12 0745  HGB 8.6* 8.8* 9.5*    Basename 08/27/12 1017 08/26/12 0720  WBC 19.3* 17.9*  RBC 2.78* 2.79*  HCT 26.1* 26.5*  PLT 211 207    Basename 08/27/12 1017 08/26/12 0720  NA 140 139  K 4.1 3.9  CL 105 107  CO2 22 21  BUN 10 11  CREATININE 0.60 0.62  GLUCOSE 103* 116*  CALCIUM 9.3 8.8    *RADIOLOGY REPORT*  Clinical Data: Worsening cough, fever, evaluate for pneumonia  CHEST - 2 VIEW  Comparison: 08/25/2012; 04/25/2012; chest CT - 04/25/2012  Findings: Grossly unchanged cardiac silhouette and mediastinal  contours. Interval development/worsening of bilateral mid lung  predominant heterogeneous, slightly reticular opacities. No pleural  effusion or pneumothorax. Unchanged bones.  IMPRESSION:  Interval development/worsening of bilateral mid lung predominant  opacities which given history of fever and cough is worrisome for  multifocal infection, including atypical etiologies. A follow-up  chest radiograph in 4 to 6 weeks after treatment is recommended to  ensure resolution.  Original Report Authenticated By: Tacey Ruiz, MD      PACS Images     Show images for CT Angio Chest PE W/Cm &/Or Wo Cm         Study Result     *RADIOLOGY REPORT*  Clinical Data: rule out PE  CT ANGIOGRAPHY CHEST  Technique: Multidetector CT imaging of the chest using the  standard  protocol during bolus administration of intravenous  contrast. Multiplanar reconstructed images including MIPs were  obtained and reviewed to evaluate the vascular anatomy.  Contrast: 80mL OMNIPAQUE IOHEXOL 350 MG/ML SOLN  Comparison: 04/25/2012  Findings: There is no pleural effusion. Multifocal areas of ground-  glass attenuation and consolidation are noted. Tracheobronchial  tree appears patent.  Normal heart size. No pericardial effusion. No mediastinal or  hilar adenopathy identified.  No abnormal filling defects identified within the main pulmonary  artery or their branches to suggest an acute pulmonary embolus.  Imaging through the upper abdomen is unremarkable.  Review of the visualized osseous structures is significant for mild  spondylosis.  IMPRESSION:  1. No evidence for acute pulmonary embolus.  2. Bilateral multifocal airspace opacities are identified  compatible with multifocal infection.     ABD soft Neurovascular intact Sensation intact distally Intact pulses distally Dorsiflexion/Plantar flexion intact Incision: dressing C/D/I and no drainage  Assessment/Plan: 4 Days Post-Op Procedure(s) (LRB): TOTAL KNEE ARTHROPLASTY (Right) Principal Problem:  *Right knee DJD Active Problems:  DIABETES MELLITUS II, UNCOMPLICATED  Unspecified vitamin D deficiency  HYPERLIPIDEMIA  OBESITY, NOS  BMI 33.8  ANXIETY  PANIC ATTACK  SLEEP APNEA, OBSTRUCTIVE, MODERATE  MIGRAINE, COMMON, INTRACTABLE  GLAUCOMA  COPD GOLD 0  GASTROESOPHAGEAL REFLUX, NO ESOPHAGITIS  HERNIA, HIATAL, NONCONGENITAL  Irritable bowel syndrome  OSTEOPENIA  CONVULSIONS, SEIZURES, NOS  Hospital-acquired pneumonia  Hx of bacterial pneumonia  Postoperative anemia due to  acute blood loss   Chest CT ordered due to progressing worsening changes on chest xray and fever of 102 last night. Consult pulmonary due to atypical progressing pneumonia on Vanc and Aztreonam.  Dr Rory Percy  suggested that  she be switched from the Orthopedic service to his service (pulmonary/critical car) due to her significant pulmonary infection. He will be her attending physician.  She is progressing well with regards to her total knee.  Recommend that she continue to receive physical therapy, CPM and other orthopedic related services.  [name] 08/27/2012, 4:33 PM

## 2012-08-28 DIAGNOSIS — J449 Chronic obstructive pulmonary disease, unspecified: Secondary | ICD-10-CM

## 2012-08-28 DIAGNOSIS — D62 Acute posthemorrhagic anemia: Secondary | ICD-10-CM

## 2012-08-28 DIAGNOSIS — J189 Pneumonia, unspecified organism: Secondary | ICD-10-CM

## 2012-08-28 LAB — GLUCOSE, CAPILLARY
Glucose-Capillary: 105 mg/dL — ABNORMAL HIGH (ref 70–99)
Glucose-Capillary: 106 mg/dL — ABNORMAL HIGH (ref 70–99)
Glucose-Capillary: 110 mg/dL — ABNORMAL HIGH (ref 70–99)

## 2012-08-28 MED ORDER — POTASSIUM CHLORIDE IN NACL 20-0.9 MEQ/L-% IV SOLN
1000.0000 mL | INTRAVENOUS | Status: DC
  Administered 2012-08-28 – 2012-09-01 (×3): 1000 mL via INTRAVENOUS
  Filled 2012-08-28 (×8): qty 1000

## 2012-08-28 NOTE — Progress Notes (Signed)
Repeat TEMP 99.4 F

## 2012-08-28 NOTE — Progress Notes (Addendum)
Tylenol given for elevated temp. Patient encouraged to use her incentive spirometer. Repeat temp at 1507 :100.44F from 102.44F at 1400.

## 2012-08-28 NOTE — Progress Notes (Signed)
SPORTS MEDICINE AND JOINT REPLACEMENT  Georgena Spurling, MD   Altamese Cabal, PA-C [address] [place], [place], [place]  [address]                             [phone]   PROGRESS NOTE  Subjective:  negative for Chest Pain  positive for Shortness of Breath  negative for Nausea/Vomiting   negative for Calf Pain  negative for Bowel Movement   Tolerating Diet: yes         Patient reports pain as 4 on 0-10 scale.    Objective: Vital signs in last 24 hours:   Patient Vitals for the past 24 hrs:  BP Temp Temp src Pulse Resp SpO2  08/28/12 0800 - - - - 20  100 %  08/28/12 0736 - - - - - 94 %  08/28/12 0534 118/60 mmHg 100 F (37.8 C) - 119  20  93 %  08/27/12 2300 - - - - - 97 %  08/27/12 2108 124/59 mmHg 98.9 F (37.2 C) - 108  18  92 %  08/27/12 1600 - - - - 18  -  08/27/12 1535 - - - - - 91 %  08/27/12 1355 108/59 mmHg 98 F (36.7 C) Oral 98  20  95 %  08/27/12 1200 - - - - 18  -    @flow {1959:LAST@   Intake/Output from previous day:   01/17 0701 - 01/18 0700 In: 1160 [P.O.:720; I.V.:240] Out: -    Intake/Output this shift:       Intake/Output      01/17 0701 - 01/18 0700 01/18 0701 - 01/19 0700   P.O. 720    I.V. (mL/kg) 240 (2.9)    IV Piggyback 200    Total Intake(mL/kg) 1160 (13.9)    Net +1160         Urine Occurrence 6 x       LABORATORY DATA:  Basename 08/27/12 1017 08/26/12 0720 08/25/12 0745 08/24/12 0605 08/23/12 1315  WBC 19.3* 17.9* 17.3* 14.8* 14.1*  HGB 8.6* 8.8* 9.5* 10.0* 11.1*  HCT 26.1* 26.5* 28.3* 30.1* 34.5*  PLT 211 207 211 230 250    Basename 08/27/12 1017 08/26/12 0720 08/25/12 0745 08/24/12 0605 08/23/12 1315  NA 140 139 139 138 --  K 4.1 3.9 3.2* 3.7 --  CL 105 107 105 107 --  CO2 22 21 24 20  --  BUN 10 11 12 8  --  CREATININE 0.60 0.62 0.73 0.64 0.67  GLUCOSE 103* 116* 109* 139* --  CALCIUM 9.3 8.8 8.8 8.6 --   Lab Results  Component Value Date   INR 0.86 08/16/2012    Examination:  General appearance: alert,  cooperative and no distress Extremities: Homans sign is negative, no sign of DVT  Wound Exam: clean, dry, intact   Drainage:  None: wound tissue dry  Motor Exam: EHL Intact  Sensory Exam: Deep Peroneal normal  Vascular Exam:    Assessment:    5 Days Post-Op  Procedure(s) (LRB): TOTAL KNEE ARTHROPLASTY (Right)  ADDITIONAL DIAGNOSIS:  Principal Problem:  *Right knee DJD Active Problems:  DIABETES MELLITUS II, UNCOMPLICATED  Unspecified vitamin D deficiency  HYPERLIPIDEMIA  OBESITY, NOS  BMI 33.8  ANXIETY  PANIC ATTACK  SLEEP APNEA, OBSTRUCTIVE, MODERATE  MIGRAINE, COMMON, INTRACTABLE  GLAUCOMA  COPD GOLD 0  GASTROESOPHAGEAL REFLUX, NO ESOPHAGITIS  HERNIA, HIATAL, NONCONGENITAL  Irritable bowel syndrome  OSTEOPENIA  CONVULSIONS, SEIZURES, NOS  Hospital-acquired pneumonia  Hx of bacterial pneumonia  Postoperative anemia due to acute blood loss  Acute Blood Loss Anemia   Plan: Physical Therapy as ordered Weight Bearing as Tolerated (WBAT)  DVT Prophylaxis:  Lovenox  DISCHARGE PLAN: per pulmonary  DISCHARGE NEEDS: HHPT, CPM, Walker and 3-in-1 comode seat         Danton Palmateer 08/28/2012, 9:24 AM

## 2012-08-28 NOTE — Progress Notes (Signed)
Name: [name] MRN:  [id number] DOB:   [birth year]                LOS:   4   REFERRING PROVIDER:  [facility]   CHIEF COMPLAINT:  57 y/o F with PMH of    BRIEF PATIENT DESCRIPTION: 57 y/o F with PMH DM, Epilepsy, IBS, HTN, HLD, GERD, esophageal stricture, COPD with ongoing tobacco abuse, admit in 04/2012 for PNA.  Admitted on 1/13 for R total knee replacement.  Post replacement had abnormal cxr with bilateral diffuse patchy airspace disease.  Rx'd with aztreonam, vanco and continued to have fevers.  CT evaluated with bilateral patchy diffuse airspace disease. PCCM consulted for evaluation. Followed by Dr. Sherene Sires in office.    LINES / TUBES:     CULTURES: 1/17 Sputum>>>   ANTIBIOTICS: Aztreonam 1/15>>> Vanco 1/15>>> Levaquin 1/17>>>   SIGNIFICANT EVENTS:   1/13 - admit for R total knee 1/15 - post op cxr with bilateral airspace disease, rx'd with aztreonam / vanco 1/17 - CT with diffuse patchy bilateral airspace, PCCM consultation  Subjective:  Pt states she is much improved, denies sob.  Does not have oxygen on this am.  cxr from this am is still pending.  Exam:  Obese female in nad Nose without purulence or discharge noted. Op clear Chest with a few scattered crackles, no wheezing Cor with rrr abd benign LE with mild edema Alert and oriented, moves all 4.     ASSESSMENT / PLAN:  1) bilat pulmonary infiltrates postop, presumed to be aspiration. The pt feels she is much improved, with no increased wob.  sats are adequate.  Will f/u cxr and cultures, continue abx for now.  2) h/o copd. The pt has no wheezing on exam, and is moving air well.  Continue BD regimen. Will try to wean oxygen.

## 2012-08-28 NOTE — Progress Notes (Signed)
For vanco trough tomorrow at 1030 prior to vanco administration.

## 2012-08-28 NOTE — Progress Notes (Signed)
Physical Therapy Treatment Patient Details Name: WANEDA KLAMMER MRN: [id number] DOB: [birth year] Today's Date: 08/28/2012 Time: 7829-5621 PT Time Calculation (min): 27 min  PT Assessment / Plan / Recommendation Comments on Treatment Session  Patient making great progress, especially after setbacks of earlier in the week. Patient much safer with ambulation and awareness of deficits.     Follow Up Recommendations  Home health PT     Does the patient have the potential to tolerate intense rehabilitation     Barriers to Discharge        Equipment Recommendations  Rolling walker with 5" wheels    Recommendations for Other Services    Frequency 7X/week   Plan Discharge plan remains appropriate;Frequency remains appropriate    Precautions / Restrictions Precautions Precautions: Knee Required Braces or Orthoses: Knee Immobilizer - Right Knee Immobilizer - Right: Discontinue once straight leg raise with < 10 degree lag Restrictions RLE Weight Bearing: Weight bearing as tolerated   Pertinent Vitals/Pain     Mobility  Bed Mobility Supine to Sit: 6: Modified independent (Device/Increase time) Sitting - Scoot to Edge of Bed: 6: Modified independent (Device/Increase time) Transfers Sit to Stand: 6: Modified independent (Device/Increase time) Stand to Sit: 6: Modified independent (Device/Increase time) Ambulation/Gait Ambulation/Gait Assistance: 5: Supervision Ambulation Distance (Feet): 320 Feet Assistive device: Rolling walker Ambulation/Gait Assistance Details: Patient close to being Mod I with ambulation but required cueing for postioning of RW and cues to decrease gait speed as she was becoming unsafe.  Gait Pattern: Step-through pattern Stairs Assistance: 4: Min assist;4: Min guard Stairs Assistance Details (indicate cue type and reason): Min A for Rw. Patient also practiced with one rail/sideways with minguard A  Stair Management Technique: One rail Right;Sideways;Backwards;With  walker;Step to pattern Number of Stairs: 4     Exercises Total Joint Exercises Heel Slides: AROM;Right;15 reps Straight Leg Raises: AROM;Right;15 reps Long Arc Quad: AROM;Right;15 reps   PT Diagnosis:    PT Problem List:   PT Treatment Interventions:     PT Goals Acute Rehab PT Goals PT Goal: Supine/Side to Sit - Progress: Met PT Goal: Sit to Stand - Progress: Met PT Goal: Stand to Sit - Progress: Met PT Goal: Ambulate - Progress: Progressing toward goal PT Goal: Up/Down Stairs - Progress: Met PT Goal: Perform Home Exercise Program - Progress: Met  Visit Information  Last PT Received On: 08/28/12 Assistance Needed: +1    Subjective Data      Cognition  Overall Cognitive Status: Appears within functional limits for tasks assessed/performed Arousal/Alertness: Awake/alert Orientation Level: Appears intact for tasks assessed Behavior During Session: [facility] for tasks performed    Balance     End of Session PT - End of Session Activity Tolerance: Patient tolerated treatment well Patient left: in chair;with call bell/phone within reach Nurse Communication: Mobility status   GP     Darnette, [place] 08/28/2012, 8:10 AM 08/28/2012 Fredrich Birks PTA [phone] pager [phone] office

## 2012-08-28 NOTE — Progress Notes (Signed)
ANTIBIOTIC CONSULT NOTE - Follow up   Pharmacy Consult for Vancomycin, Aztreonam Indication: Suspected HCAP  Patient Measurements: Height: 5\' 2"  (157.5 cm) Weight: 184 lb 3.2 oz (83.553 kg) IBW/kg (Calculated) : 50.1   Vital Signs: Temp: 100.7 F (38.2 C) (01/18 1515) Temp src: Oral (01/18 1515) BP: 142/64 mmHg (01/18 1406) Pulse Rate: 117  (01/18 1406) Intake/Output from previous day: 01/17 0701 - 01/18 0700 In: 1160 [P.O.:720; I.V.:240; IV Piggyback:200] Out: -  Intake/Output from this shift: Total I/O In: 560 [P.O.:560] Out: -   Labs:  Basename 08/27/12 1017 08/26/12 0720  WBC 19.3* 17.9*  HGB 8.6* 8.8*  PLT 211 207  LABCREA -- --  CREATININE 0.60 0.62    Assessment: [name] is s/p R TKA 1/13 on day#4 Vancomycin and aztreonam. Post replacement had abnormal cxr with bilateral diffuse patchy airspace disease. Rx'd with aztreonam, vanco and continued to have fevers. Previously admitted to the hospital within the last 30 days, so is at risk for MDR organisms/infections.  1/17 Blood  cx x 2 NGTD. 1/17 - CT with diffuse patchy bilateral airspace.   Levaquin started on 1/17. Antibiotics continuing for now. PCCM to evaluated CXR today. MD noted patient feels much improved, with no increased WOB, Sats 96 % on 2L [place] Scr stable at 0.6, CrCl ~ 80 ml/min   Goal of Therapy:  Vancomycin trough level 15-20 mcg/ml  Plan:  Continue Aztreonam 1gm  IV q8h and  Vancomycin 1gm IV q 12h. - Will monitor cx/spec/sens, renal fxn and clinical status daily.  Will  check Css vanc trough tomorrow at 10:30 AM.  Thanks, Noah Delaine, RPh Clinical Pharmacist Pager: [phone] 08/28/2012 3:20 PM

## 2012-08-29 ENCOUNTER — Inpatient Hospital Stay (HOSPITAL_COMMUNITY): Payer: Medicaid Other

## 2012-08-29 LAB — GLUCOSE, CAPILLARY
Glucose-Capillary: 113 mg/dL — ABNORMAL HIGH (ref 70–99)
Glucose-Capillary: 117 mg/dL — ABNORMAL HIGH (ref 70–99)
Glucose-Capillary: 112 mg/dL — ABNORMAL HIGH (ref 70–99)

## 2012-08-29 MED ORDER — WHITE PETROLATUM GEL
Status: AC
  Filled 2012-08-29: qty 5

## 2012-08-29 NOTE — Progress Notes (Signed)
Physical Therapy Treatment Patient Details Name: [name] MRN: [id number] DOB: 1956/01/11 Today's Date: 08/29/2012 Time: 9147-8295 PT Time Calculation (min): 29 min  PT Assessment / Plan / Recommendation Comments on Treatment Session   Pt reports not feeling well this AM but agreeable to participate as much as possible.  Pt with decreased activity tolerance this session & required supplemental 02.      Follow Up Recommendations  Home health PT     Does the patient have the potential to tolerate intense rehabilitation     Barriers to Discharge        Equipment Recommendations  Rolling walker with 5" wheels    Recommendations for Other Services    Frequency 7X/week   Plan Discharge plan remains appropriate;Frequency remains appropriate    Precautions / Restrictions Precautions Precautions: Knee Required Braces or Orthoses: Knee Immobilizer - Right Knee Immobilizer - Right: Discontinue once straight leg raise with < 10 degree lag Restrictions RLE Weight Bearing: Weight bearing as tolerated   Pertinent Vitals/Pain 10/10 Rt knee beginning of session.  8/10 end of session.  RN administered IV pain medication.    02 sats:   85% RA beginning of session.   97% 2L sitting EOB before ambulation 79% RA ambulation 95% 2L with rest    Mobility  Bed Mobility Bed Mobility: Supine to Sit;Sitting - Scoot to Edge of Bed Supine to Sit: 6: Modified independent (Device/Increase time);HOB flat Sitting - Scoot to Edge of Bed: 6: Modified independent (Device/Increase time) Transfers Transfers: Sit to Stand;Stand to Sit Sit to Stand: 6: Modified independent (Device/Increase time);With upper extremity assist;From bed;From chair/3-in-1 Stand to Sit: 6: Modified independent (Device/Increase time);With upper extremity assist;With armrests;To chair/3-in-1 Ambulation/Gait Ambulation/Gait Assistance: 4: Min guard Ambulation Distance (Feet): 100 Feet (50' + 50') Assistive device: Other (Comment)  (pushed 4-wheeled 02 cart) Ambulation/Gait Assistance Details: Pt used 4-wheeled 02 cart to mimic RW today.  Pt reports increased difficulty with ambulation due to pain.  Cont's with short step/stride length.   Gait Pattern: Step-through pattern;Decreased stride length;Antalgic;Decreased weight shift to right;Decreased stance time - right;Decreased step length - left;Decreased step length - right (decreased floor clearance.  )    Exercises Total Joint Exercises Ankle Circles/Pumps: AROM;Both;15 reps Quad Sets: AROM;Both;10 reps Heel Slides: AROM;Right;15 reps Straight Leg Raises: AROM;Right;15 reps Long Arc Quad: AROM;Right;15 reps   PT Diagnosis:    PT Problem List:   PT Treatment Interventions:     PT Goals Acute Rehab PT Goals PT Goal Formulation: With patient Time For Goal Achievement: 08/28/12 Potential to Achieve Goals: Good Pt will go Supine/Side to Sit: with supervision PT Goal: Supine/Side to Sit - Progress: Met Pt will go Sit to Supine/Side: with supervision Pt will go Sit to Stand: with supervision;with upper extremity assist PT Goal: Sit to Stand - Progress: Met Pt will go Stand to Sit: with supervision;with upper extremity assist PT Goal: Stand to Sit - Progress: Met Pt will Ambulate: >150 feet;with least restrictive assistive device PT Goal: Ambulate - Progress: Not met Pt will Go Up / Down Stairs: 1-2 stairs;with rail(s);with min assist Pt will Perform Home Exercise Program: with supervision, verbal cues required/provided PT Goal: Perform Home Exercise Program - Progress: Met  Visit Information  Last PT Received On: 08/29/12 Assistance Needed: +1    Subjective Data  Subjective: "Im doing as well today"   Cognition  Overall Cognitive Status: Appears within functional limits for tasks assessed/performed Arousal/Alertness: Awake/alert Orientation Level: Appears intact for tasks assessed Behavior During  Session: [facility] for tasks performed    Balance     End of  Session PT - End of Session Equipment Utilized During Treatment: Gait belt Activity Tolerance: Patient limited by pain;Other (comment) (decreased activity tolerance today) Patient left: in chair;with call bell/phone within reach Nurse Communication: Mobility status    Verdell Face, [place] [phone] 08/29/2012

## 2012-08-29 NOTE — Progress Notes (Signed)
Name: [name] MRN:  [id number] DOB:   04-28-1956                LOS:   4   REFERRING PROVIDER:  [facility]   CHIEF COMPLAINT:  57 y/o F with PMH of    BRIEF PATIENT DESCRIPTION: 57 y/o F with PMH DM, Epilepsy, IBS, HTN, HLD, GERD, esophageal stricture, COPD with ongoing tobacco abuse, admit in 04/2012 for PNA.  Admitted on 1/13 for R total knee replacement.  Post replacement had abnormal cxr with bilateral diffuse patchy airspace disease.  Rx'd with aztreonam, vanco and continued to have fevers.  CT evaluated with bilateral patchy diffuse airspace disease. PCCM consulted for evaluation. Followed by Dr. Sherene Sires in office.    LINES / TUBES:     CULTURES: 1/17 Sputum>>> never sent 1/17 blood >> neg so far  ANTIBIOTICS: Aztreonam 1/15>>> Vanco 1/15>>> Levaquin 1/17>>>   SIGNIFICANT EVENTS:   1/13 - admit for R total knee 1/15 - post op cxr with bilateral airspace disease, rx'd with aztreonam / vanco 1/17 - CT with diffuse patchy bilateral airspace, PCCM consultation  Subjective:  Pt states she is much improved, denies sob.   Had fever overnight.  Exam:  Obese female in nad Nose without purulence or discharge noted. Op clear Chest with a few scattered crackles, no wheezing Cor with rrr abd benign LE with mild edema Alert and oriented, moves all 4.     ASSESSMENT / PLAN:  1) bilat pulmonary infiltrates postop, presumed to be aspiration. The pt feels she is much improved, with no increased wob.  sats are adequate.  However, is having persistent fever despite broad spectrum abx.  Will check cxr today, and try to get sputum culture sent (never sent 1/17).  The other consideration is whether there is another source of fever (urine, joint, etc.)  2) h/o copd. The pt has no wheezing on exam, and is moving air well.  Continue BD regimen. Will try to wean oxygen.

## 2012-08-29 NOTE — Progress Notes (Signed)
SPORTS MEDICINE AND JOINT REPLACEMENT  Georgena Spurling, MD   Altamese Cabal, PA-C [address] [place], [place], [place]  [address]                             [phone]   PROGRESS NOTE  Subjective:  negative for Chest Pain  positive for Shortness of Breath  negative for Nausea/Vomiting   negative for Calf Pain  negative for Bowel Movement   Tolerating Diet: yes         Patient reports pain as 3 on 0-10 scale.    Objective: Vital signs in last 24 hours:   Patient Vitals for the past 24 hrs:  BP Temp Temp src Pulse Resp SpO2  08/29/12 0838 - - - - - 94 %  08/29/12 0800 - - - - 24  97 %  08/29/12 0600 121/60 mmHg 102.4 F (39.1 C) Oral 106  18  96 %  08/28/12 2241 124/67 mmHg 100 F (37.8 C) Oral 102  18  96 %  08/28/12 1835 - 99.4 F (37.4 C) - - - -  08/28/12 1600 - - - - 20  96 %  08/28/12 1558 - - - - - 95 %  08/28/12 1515 - 100.7 F (38.2 C) Oral 109  20  96 %  08/28/12 1406 142/64 mmHg 102.7 F (39.3 C) Oral 117  20  98 %  08/28/12 1200 - - - - 22  100 %  08/28/12 1132 - - - - - 97 %    @flow {1959:LAST@   Intake/Output from previous day:   01/18 0701 - 01/19 0700 In: 560 [P.O.:560] Out: -    Intake/Output this shift:       Intake/Output      01/18 0701 - 01/19 0700 01/19 0701 - 01/20 0700   P.O. 560    I.V. (mL/kg)     IV Piggyback     Total Intake(mL/kg) 560 (6.7)    Net +560         Urine Occurrence 4 x       LABORATORY DATA:  Basename 08/27/12 1017 08/26/12 0720 08/25/12 0745 08/24/12 0605 08/23/12 1315  WBC 19.3* 17.9* 17.3* 14.8* 14.1*  HGB 8.6* 8.8* 9.5* 10.0* 11.1*  HCT 26.1* 26.5* 28.3* 30.1* 34.5*  PLT 211 207 211 230 250    Basename 08/27/12 1017 08/26/12 0720 08/25/12 0745 08/24/12 0605 08/23/12 1315  NA 140 139 139 138 --  K 4.1 3.9 3.2* 3.7 --  CL 105 107 105 107 --  CO2 22 21 24 20  --  BUN 10 11 12 8  --  CREATININE 0.60 0.62 0.73 0.64 0.67  GLUCOSE 103* 116* 109* 139* --  CALCIUM 9.3 8.8 8.8 8.6 --   Lab Results    Component Value Date   INR 0.86 08/16/2012    Examination:  General appearance: alert, cooperative and no distress Extremities: Homans sign is negative, no sign of DVT  Wound Exam: clean, dry, intact   Drainage:  None: wound tissue dry  Motor Exam: EHL and FHL Intact  Sensory Exam: Deep Peroneal normal  Vascular Exam:    Assessment:    6 Days Post-Op  Procedure(s) (LRB): TOTAL KNEE ARTHROPLASTY (Right)  ADDITIONAL DIAGNOSIS:  Principal Problem:  *Right knee DJD Active Problems:  DIABETES MELLITUS II, UNCOMPLICATED  Unspecified vitamin D deficiency  HYPERLIPIDEMIA  OBESITY, NOS  BMI 33.8  ANXIETY  PANIC ATTACK  SLEEP APNEA, OBSTRUCTIVE,  MODERATE  MIGRAINE, COMMON, INTRACTABLE  GLAUCOMA  COPD GOLD 0  GASTROESOPHAGEAL REFLUX, NO ESOPHAGITIS  HERNIA, HIATAL, NONCONGENITAL  Irritable bowel syndrome  OSTEOPENIA  CONVULSIONS, SEIZURES, NOS  Hospital-acquired pneumonia  Hx of bacterial pneumonia  Postoperative anemia due to acute blood loss  Acute Blood Loss Anemia   Plan: Physical Therapy as ordered Weight Bearing as Tolerated (WBAT)  DVT Prophylaxis:  Lovenox  DISCHARGE PLAN: per pulm. Okay to discharge from ortho standpoint  DISCHARGE NEEDS: HHPT, CPM, Walker and 3-in-1 comode seat         Gatlyn Lipari 08/29/2012, 9:08 AM

## 2012-08-30 ENCOUNTER — Inpatient Hospital Stay (HOSPITAL_COMMUNITY): Payer: Medicaid Other

## 2012-08-30 DIAGNOSIS — R609 Edema, unspecified: Secondary | ICD-10-CM

## 2012-08-30 LAB — BASIC METABOLIC PANEL
CO2: 21 mEq/L (ref 19–32)
GFR calc non Af Amer: >90 mL/min (ref >90)
Glucose, Bld: 94 mg/dL (ref 70–99)
Potassium: 3.6 mEq/L (ref 3.5–5.1)
Sodium: 134 mEq/L — ABNORMAL LOW (ref 135–145)

## 2012-08-30 LAB — URINALYSIS, ROUTINE W REFLEX MICROSCOPIC
Glucose, UA: NEGATIVE mg/dL
Ketones, ur: NEGATIVE mg/dL
Protein, ur: 30 mg/dL — AB
Urobilinogen, UA: 1 mg/dL (ref 0.0–1.0)

## 2012-08-30 LAB — C3 COMPLEMENT: C3 Complement: 177 mg/dL (ref 90–180)

## 2012-08-30 LAB — CBC
Hemoglobin: 8.1 g/dL — ABNORMAL LOW (ref 12.0–15.0)
MCHC: 32.9 g/dL (ref 30.0–36.0)
Platelets: 223 10*3/uL (ref 150–400)
RBC: 2.62 MIL/uL — ABNORMAL LOW (ref 3.87–5.11)

## 2012-08-30 LAB — BLOOD GAS, ARTERIAL
Acid-base deficit: 2.1 mmol/L — ABNORMAL HIGH (ref 0.0–2.0)
Drawn by: 281201
FIO2: 0.4 %
pCO2 arterial: 43.6 mmHg (ref 35.0–45.0)
pH, Arterial: 7.338 — ABNORMAL LOW (ref 7.350–7.450)
pO2, Arterial: 62.4 mmHg — ABNORMAL LOW (ref 80.0–100.0)

## 2012-08-30 LAB — ANA: Anti Nuclear Antibody(ANA): NEGATIVE

## 2012-08-30 LAB — GLUCOSE, CAPILLARY
Glucose-Capillary: 104 mg/dL — ABNORMAL HIGH (ref 70–99)
Glucose-Capillary: 104 mg/dL — ABNORMAL HIGH (ref 70–99)
Glucose-Capillary: 114 mg/dL — ABNORMAL HIGH (ref 70–99)
Glucose-Capillary: 118 mg/dL — ABNORMAL HIGH (ref 70–99)
Glucose-Capillary: 91 mg/dL (ref 70–99)

## 2012-08-30 LAB — TROPONIN I: Troponin I: <0.3 ng/mL (ref <0.30)

## 2012-08-30 MED ORDER — NITROGLYCERIN 0.4 MG SL SUBL
SUBLINGUAL_TABLET | SUBLINGUAL | Status: AC
  Administered 2012-08-30: 15:00:00
  Filled 2012-08-30: qty 25

## 2012-08-30 MED ORDER — METHYLPREDNISOLONE SODIUM SUCC 125 MG IJ SOLR
60.0000 mg | Freq: Four times a day (QID) | INTRAMUSCULAR | Status: DC
  Administered 2012-08-30 (×2): 60 mg via INTRAVENOUS
  Administered 2012-08-31: 08:00:00 via INTRAVENOUS
  Administered 2012-08-31 (×2): 60 mg via INTRAVENOUS
  Administered 2012-08-31 – 2012-09-01 (×2): via INTRAVENOUS
  Administered 2012-09-01 (×2): 60 mg via INTRAVENOUS
  Filled 2012-08-30 (×12): qty 0.96

## 2012-08-30 MED ORDER — NITROGLYCERIN 0.4 MG SL SUBL
SUBLINGUAL_TABLET | SUBLINGUAL | Status: AC
  Administered 2012-08-30: 0.4 mg
  Filled 2012-08-30: qty 25

## 2012-08-30 MED ORDER — POTASSIUM CHLORIDE CRYS ER 20 MEQ PO TBCR
40.0000 meq | EXTENDED_RELEASE_TABLET | Freq: Two times a day (BID) | ORAL | Status: DC
  Administered 2012-08-30 – 2012-09-03 (×9): 40 meq via ORAL
  Filled 2012-08-30 (×11): qty 2

## 2012-08-30 MED ORDER — FUROSEMIDE 10 MG/ML IJ SOLN
40.0000 mg | Freq: Two times a day (BID) | INTRAMUSCULAR | Status: DC
  Administered 2012-08-30 – 2012-08-31 (×3): 40 mg via INTRAVENOUS
  Filled 2012-08-30 (×4): qty 4

## 2012-08-30 MED ORDER — FUROSEMIDE 10 MG/ML IJ SOLN: 40.0000 mg | Freq: Once | INTRAMUSCULAR | Status: DC

## 2012-08-30 MED ORDER — FENTANYL CITRATE 0.05 MG/ML IJ SOLN
INTRAMUSCULAR | Status: AC
  Filled 2012-08-30: qty 2

## 2012-08-30 MED ORDER — FENTANYL CITRATE 0.05 MG/ML IJ SOLN
12.5000 ug | Freq: Once | INTRAMUSCULAR | Status: AC
  Administered 2012-08-30: 12.5 ug via INTRAVENOUS

## 2012-08-30 MED ORDER — FUROSEMIDE 10 MG/ML IJ SOLN
INTRAMUSCULAR | Status: AC
  Filled 2012-08-30: qty 2

## 2012-08-30 MED ORDER — FUROSEMIDE 10 MG/ML IJ SOLN: 20.0000 mg | Freq: Once | INTRAMUSCULAR | Status: DC

## 2012-08-30 NOTE — Progress Notes (Signed)
During shift change (0730) pts O2 saturation was in the 70s to low 80s and hyperventilating with O2 on. Pt was instructed to take deep breaths and was placed on O2 at 3L. Vital signs were taken: temp: 100.7, BP 109/64, HR 115, RR 22, CBG 104. Pt was unable to take breaths through her nose so pt was put on a venti mask because it was easier for her to get a breath through her mouth. Pt was then placed on a venti mask. Pts O2 saturation began sating in the the low 90s. MD [name] was paged and orders were given to night RN for pt to be given a 1x dose of 20mg  IV lasix and to be transferred to 2600. Rapid response and respiratory therapy was notified. Albuterol was administered at 808. Pt currently stable. Will continue to monitor.   Report called to RN on 2600. Pt left unit in a stable condition at 1100.

## 2012-08-30 NOTE — Progress Notes (Signed)
Pt was placed on CPAP Auto mode 5-20 cmH2O. Rt will continue to monitor pt.

## 2012-08-30 NOTE — Progress Notes (Signed)
Agree with PTA.    Etsuko Dierolf, PT [phone]  

## 2012-08-30 NOTE — Progress Notes (Signed)
Pt c/o 10/10 left-sided sharp in nature CP. Nitro given x 3, EKG done, ABG drawn, fentanyl 12.5mg  given per MD [name] orders. CP now 1/10. Orders to place central line. Will cont to monitor pt. Helena Sardo L

## 2012-08-30 NOTE — Progress Notes (Signed)
Time-out completed, informed consent obtained, right IJ placed. Pt tolerated procedure well. [name]

## 2012-08-30 NOTE — Progress Notes (Signed)
One time dose of 20mg  Lasix was given at 8:45. Night RN forgot to scan med.

## 2012-08-30 NOTE — Progress Notes (Signed)
Late note. During report to oncoming RN Chari Manning, Physical Therapist requested assistance in patient's room.  Found PT still positioning patient, bed in trendelenburg position.  Patient in obvious distress, O2 sats 73-74% on O2 @ 3L via [place].  PT stated O2 sats dropped when patient assisted oob to bsc.  Patient's O2 sats slow to rise to mid 80's, audible wheezing noted, Charge RN Lauren in to assist, v/s stable, recheck of cbg ok @ 104, placed ventimask @ 24%.  Call placed to RT for assistance/breathing treatment.  Dr. [name] paged, orders r'cvd for lasix 20mg  IV and transfer to step down unit.  Lasix 20mg  IV given at 8:45am.  Patient lying in bed appearing more comfortable, speaking w/ family member on cell phone.  O2 sats per CPOX 98-100% via 40% vm. (Continuous pulse ox placed on patient at midnight to monitor O2 sats more closely.)  Advised patient of transfer order, called patient's husband Wylene Men to advise of mornings events and transfer to step down unit.  Husband requests staff call him w/ new room number when known, day shift RN aware.  Rapid Response Nurse called and is now on floor to assist w/ patient condition and transfer.

## 2012-08-30 NOTE — Progress Notes (Signed)
Central line placement verified by MD Pribula and chest x-ray for use. [name]

## 2012-08-30 NOTE — Progress Notes (Signed)
Subjective: 7 Days Post-Op Procedure(s) (LRB): TOTAL KNEE ARTHROPLASTY (Right) Patient reports pain as 3 on 0-10 scale.    Objective: Vital signs in last 24 hours: Temp:  [98.2 F (36.8 C)-99.7 F (37.6 C)] 98.2 F (36.8 C) (01/20 1131) Pulse Rate:  [98-115] 99  (01/20 1242) Resp:  [18-24] 21  (01/20 1242) BP: (96-130)/(52-78) 109/64 mmHg (01/20 1242) SpO2:  [72 %-100 %] 100 % (01/20 1242) FiO2 (%):  [24 %-40 %] 35 % (01/20 1242) Weight:  [83.008 kg (183 lb)] 83.008 kg (183 lb) (01/20 1131)  Intake/Output from previous day: 01/19 0701 - 01/20 0700 In: 1170 [P.O.:720; IV Piggyback:450] Out: -  Intake/Output this shift: Total I/O In: 390 [P.O.:120; I.V.:20; IV Piggyback:250] Out: 100 [Urine:100]   Basename 08/30/12 0620  HGB 8.1*    Basename 08/30/12 0620  WBC 16.8*  RBC 2.62*  HCT 24.6*  PLT 223    Basename 08/30/12 0620  NA 134*  K 3.6  CL 99  CO2 21  BUN 12  CREATININE 0.71  GLUCOSE 94  CALCIUM 8.7   No results found for this basename: LABPT:2,INR:2 in the last 72 hours  ABD soft Neurovascular intact Sensation intact distally Intact pulses distally Dorsiflexion/Plantar flexion intact Incision: no drainage Patient on a rebreather mask O2 with sats in the 99%.  Patient is in obvious distress.  Sedate and looking not well.  Assessment/Plan: 7 Days Post-Op Procedure(s) (LRB): TOTAL KNEE ARTHROPLASTY (Right) Principal Problem:  *Right knee DJD Active Problems:  DIABETES MELLITUS II, UNCOMPLICATED  Unspecified vitamin D deficiency  HYPERLIPIDEMIA  OBESITY, NOS  BMI 33.8  ANXIETY  PANIC ATTACK  SLEEP APNEA, OBSTRUCTIVE, MODERATE  MIGRAINE, COMMON, INTRACTABLE  GLAUCOMA  COPD GOLD 0  GASTROESOPHAGEAL REFLUX, NO ESOPHAGITIS  HERNIA, HIATAL, NONCONGENITAL  Irritable bowel syndrome  OSTEOPENIA  CONVULSIONS, SEIZURES, NOS  Hospital-acquired pneumonia  Hx of bacterial pneumonia  Postoperative anemia due to acute blood loss    Dr Micah Flesher  is seeing her now.  She was transferred from 5 north to step down ICU this am due to respiratory distress.  Concerned because she is getting worse rather than better.  Knee continues to look good.  Will follow.  Hemoglobin is 8.1  Will not transfuse due to significant progressing lung infection.  Jayion Schneck J 08/30/2012, 1:49 PM

## 2012-08-30 NOTE — Progress Notes (Signed)
  Echocardiogram 2D Echocardiogram has been performed.  Kasey Hansell FRANCES 08/30/2012, 6:10 PM

## 2012-08-30 NOTE — Progress Notes (Signed)
Physical Therapy Treatment Patient Details Name: [name] MRN: [id number] DOB: Jul 03, 1956 Today's Date: 08/30/2012 Time: 4098-1191 PT Time Calculation (min): 26 min  PT Assessment / Plan / Recommendation Comments on Treatment Session  Upon entering room patient was sitting EOB on the cell phone, crying and with obvious SOB. Patient sating in the 54s and dyspnea 3/4. Patient was transfer to [facility] and required increased assistance. Once on [facility] safety decreased, sats dropped to 65% on #.5L and HR increased to as high as 135. RN was paged to room as well as charge to asess patient. Patient is now requiring more assistance and presents with decreased activity status due to respiratory status. Will update goals and plan accordingly May need to consider SNF for rehab if patient unable to progress with activity tolerance once respiratory status is stable. Concern about assistance and compliance at home    Follow Up Recommendations  Home health PT;Supervision/Assistance - 24 hour;SNF (depending progress)     Does the patient have the potential to tolerate intense rehabilitation     Barriers to Discharge        Equipment Recommendations  Rolling walker with 5" wheels    Recommendations for Other Services    Frequency 7X/week   Plan Discharge plan needs to be updated;Frequency remains appropriate    Precautions / Restrictions Precautions Precautions: Knee Knee Immobilizer - Right: Discontinue once straight leg raise with < 10 degree lag (discontinued on Thurs 1/17)   Pertinent Vitals/Pain O2 dropped as low as 65% on 3L. Nursing came into room with charge nurse to place mask and upon leaving room patient back up to low 90s on 3L venti mask.     Mobility  Bed Mobility Supine to Sit: 5: Supervision Sit to Supine: 4: Min assist Details for Bed Mobility Assistance: Patient requiring assistance this session and she has increased anxiety, decreased safety awareness, hyperventilation. A for LEs back  into bed.  Transfers Sit to Stand: 4: Min assist;With upper extremity assist;From bed;From chair/3-in-1 Stand to Sit: 4: Min assist;With upper extremity assist;With armrests;To chair/3-in-1 Stand Pivot Transfers: 3: Mod assist Details for Transfer Assistance: A for balance and stability as patient initally trying to get to [facility] without use of RW. Patient fell back on BSC x3 due to decrease safety and lack of correct technique and listening to instruction.  Ambulation/Gait Ambulation/Gait Assistance: Not tested (comment)    Exercises     PT Diagnosis:    PT Problem List:   PT Treatment Interventions:     PT Goals Acute Rehab PT Goals Time For Goal Achievement: 08/30/12 Pt will go Supine/Side to Sit: Independently PT Goal: Supine/Side to Sit - Progress: Updated due to goal met Pt will go Sit to Supine/Side: Independently PT Goal: Sit to Supine/Side - Progress: Updated due to goal met Pt will go Sit to Stand: with modified independence PT Goal: Sit to Stand - Progress: Goal set today Pt will go Stand to Sit: with modified independence PT Goal: Stand to Sit - Progress: Goal set today Pt will Ambulate: >150 feet;with supervision PT Goal: Ambulate - Progress: Goal set today Pt will Go Up / Down Stairs: 1-2 stairs;with supervision;with least restrictive assistive device PT Goal: Up/Down Stairs - Progress: Goal set today Pt will Perform Home Exercise Program: Independently PT Goal: Perform Home Exercise Program - Progress: Goal set today  Visit Information  Last PT Received On: 08/30/12 Assistance Needed: +1    Subjective Data      Cognition  Overall  Cognitive Status: Impaired Area of Impairment: Safety/judgement;Awareness of errors Arousal/Alertness: Awake/alert Orientation Level: Appears intact for tasks assessed Behavior During Session: Anxious Safety/Judgement: Impulsive;Decreased safety judgement for tasks assessed Awareness of Errors: Assistance required to identify errors  made Cognition - Other Comments: Patient sitting EOB attempting to get up without assistance to [facility] with O2 in the 60s and decreased balance and safety. RN called into room. Patient crying and anxious    Balance     End of Session PT - End of Session Equipment Utilized During Treatment: Gait belt Activity Tolerance: Treatment limited secondary to medical complications (Comment) (decreased O2 sats throughout. RN aware) Nurse Communication: Other (comment) (resp. status. )   GP     Fredrich Birks 08/30/2012, 8:26 AM 08/30/2012 Fredrich Birks PTA [phone] pager [phone] office

## 2012-08-30 NOTE — Progress Notes (Signed)
RT called to pt's room due to respiratory distress. Pt placed on 24% VM by nurse, and RT gave Albuterol neb treatment. Pt c/o pain in stomach and "every where else". After neb, sats dropped again. Pt placed on 40% VM and is maintaining around 98%. Pt has bilateral expiratory wheeze. Resting somewhat more comfortably at this time. RT Will continue to monitor.

## 2012-08-30 NOTE — Progress Notes (Signed)
Utilization review completed. Shewanda Sharpe, RN, BSN. 

## 2012-08-30 NOTE — Procedures (Signed)
Central Venous Catheter Insertion Procedure Note CHARMEKA FREEBURG [id number] 08-Apr-1956  Procedure: Insertion of Central Venous Catheter Indications: Assessment of intravascular volume, Drug and/or fluid administration and Frequent blood sampling  Procedure Details Consent: Risks of procedure as well as the alternatives and risks of each were explained to the (patient/caregiver).  Consent for procedure obtained. Time Out: Verified patient identification, verified procedure, site/side was marked, verified correct patient position, special equipment/implants available, medications/allergies/relevent history reviewed, required imaging and test results available.  Performed  Maximum sterile technique was used including antiseptics, cap, gloves, gown, hand hygiene, mask and sheet. Skin prep: Chlorhexidine; local anesthetic administered A antimicrobial bonded/coated triple lumen catheter was placed in the right internal jugular vein using the Seldinger technique.  Evaluation Blood flow good Complications: No apparent complications Patient did tolerate procedure well. Chest X-ray ordered to verify placement.  CXR: pending.  PRIBULA,CHRISTOPHER, MD Internal Medicine Resident, PGY III Co-Chief Resident, Internal Medicine Pager: [phone] 08/30/2012 4:31 PM    For cvp Tolerated ell Supervised procedure [place]  .digdf

## 2012-08-30 NOTE — Progress Notes (Signed)
Name: LAVETA GILKEY MRN:  [id number] DOB:   September 16, 1955                LOS:   4   REFERRING PROVIDER:  [facility]   CHIEF COMPLAINT:  hypoxia   BRIEF PATIENT DESCRIPTION: 57 y/o F with PMH DM, Epilepsy, IBS, HTN, HLD, GERD, esophageal stricture, COPD with ongoing tobacco abuse, admit in 04/2012 for PNA.  Admitted on 1/13 for R total knee replacement.  Post replacement had abnormal cxr with bilateral diffuse patchy airspace disease.  Rx'd with aztreonam, vanco and continued to have fevers.  CT evaluated with bilateral patchy diffuse airspace disease. PCCM consulted for evaluation. Followed by Dr. Sherene Sires in office.   LINES / TUBES:  CULTURES: 1/17 Sputum>>> never sent 1/17 blood >> neg so far  ANTIBIOTICS: Aztreonam 1/15>>> Vanco 1/15>>> Levaquin 1/17>>>  SIGNIFICANT EVENTS:   1/13 - admit for R total knee 1/15 - post op cxr with bilateral airspace disease, rx'd with aztreonam / vanco 1/17 - CT with diffuse patchy bilateral airspace, PCCM consultation 1-20 tx to sdu for hypoxia, esr up  Subjective:  Pt states she is much improved, denies sob.   Had fever 1-19.  Exam: Obese female increased fio2 needs Nose without purulence or discharge noted. Op clear Chest - diffuse wet crackles, anterior predominant Cor with rrr abd benign LE with mild edema Alert and oriented, moves all 4.   ASSESSMENT / PLAN:  1) bilat pulmonary infiltrates postop, presumed to be aspiration. 1-20 hypoxia, unclear etiology bilateral diffuse infiltrates -r/o aspiration peri operative and now ALI, r/o edema -was pos 1.17 liters last 24 hrs  -1-20 lasix x 2 with K+ replacement, to neg balance goal 1 liter -Check bnp -tx to sdu 1-20 -abg ensure good ventilation -consent line, assess cvp -trop x 1 -Dc statin, rare cause pneumonitis -empiric steroids for noninfectious pneumonitis -send c3 c4 ana, dsdna, rf, anca, UA -Flu in  House transmission? Will assess -if worsen, and ETT needed, would  bronch  2) h/o copd. On tx Steroids added as well  3) Anemia, dilutional? Or loss No role Tx Lasix, cbc in am  4) PNA, r/o infectious, vs noninfectious, r/o transient bacteremia and now ALI -fevers NOT noted, not too many options with allergy list, maintain levofloxacin -if declines further, attempt IMI with close observation -BC -echo for veg -repeat BC -ana, dsdna, anca, c3 c4, RF  Will update orthro  [name]. [name], MD, FACP Pgr: [phone] [facility]

## 2012-08-30 NOTE — Progress Notes (Signed)
OT Cancellation Note  Patient Details Name: [name] MRN: [id number] DOB: 12/22/1955   Cancelled Treatment:     Pt not appropriate for OT tx this morning. Pt had O2 SATs in 60s this a.m. after transfer to [facility] with PT. Pt transferred to unit 2300  [name] 08/30/2012, 11:31 AM

## 2012-08-31 ENCOUNTER — Inpatient Hospital Stay (HOSPITAL_COMMUNITY): Payer: Medicaid Other

## 2012-08-31 DIAGNOSIS — J309 Allergic rhinitis, unspecified: Secondary | ICD-10-CM

## 2012-08-31 DIAGNOSIS — K219 Gastro-esophageal reflux disease without esophagitis: Secondary | ICD-10-CM

## 2012-08-31 DIAGNOSIS — R0609 Other forms of dyspnea: Secondary | ICD-10-CM

## 2012-08-31 LAB — INFLUENZA PANEL BY PCR (TYPE A & B): Influenza B By PCR: NEGATIVE

## 2012-08-31 LAB — MAGNESIUM: Magnesium: 2 mg/dL (ref 1.5–2.5)

## 2012-08-31 LAB — CBC WITH DIFFERENTIAL/PLATELET
Basophils Relative: 0 % (ref 0–1)
Eosinophils Absolute: 0 10*3/uL (ref 0.0–0.7)
Lymphs Abs: 0.5 10*3/uL — ABNORMAL LOW (ref 0.7–4.0)
MCH: 30.8 pg (ref 26.0–34.0)
MCHC: 32.6 g/dL (ref 30.0–36.0)
Neutro Abs: 11.8 10*3/uL — ABNORMAL HIGH (ref 1.7–7.7)
Neutrophils Relative %: 94 % — ABNORMAL HIGH (ref 43–77)
Platelets: 239 10*3/uL (ref 150–400)
RBC: 2.5 MIL/uL — ABNORMAL LOW (ref 3.87–5.11)

## 2012-08-31 LAB — PRO B NATRIURETIC PEPTIDE: Pro B Natriuretic peptide (BNP): 509.5 pg/mL — ABNORMAL HIGH (ref 0–125)

## 2012-08-31 LAB — ANTI-DNA ANTIBODY, DOUBLE-STRANDED: ds DNA Ab: 3 IU/mL (ref <30)

## 2012-08-31 LAB — COMPREHENSIVE METABOLIC PANEL
ALT: 38 U/L — ABNORMAL HIGH (ref 0–35)
Albumin: 2.2 g/dL — ABNORMAL LOW (ref 3.5–5.2)
Alkaline Phosphatase: 203 U/L — ABNORMAL HIGH (ref 39–117)
Chloride: 96 mEq/L (ref 96–112)
Potassium: 3.6 mEq/L (ref 3.5–5.1)
Sodium: 134 mEq/L — ABNORMAL LOW (ref 135–145)
Total Bilirubin: 0.5 mg/dL (ref 0.3–1.2)
Total Protein: 6.9 g/dL (ref 6.0–8.3)

## 2012-08-31 LAB — PHOSPHORUS: Phosphorus: 4.7 mg/dL — ABNORMAL HIGH (ref 2.3–4.6)

## 2012-08-31 LAB — GLUCOSE, CAPILLARY: Glucose-Capillary: 140 mg/dL — ABNORMAL HIGH (ref 70–99)

## 2012-08-31 MED ORDER — FUROSEMIDE 10 MG/ML IJ SOLN
40.0000 mg | Freq: Three times a day (TID) | INTRAMUSCULAR | Status: DC
  Administered 2012-08-31 – 2012-09-02 (×5): 40 mg via INTRAVENOUS
  Filled 2012-08-31 (×7): qty 4

## 2012-08-31 NOTE — Progress Notes (Signed)
Patient ID: [name], female   DOB: 19-Feb-1956, 57 y.o.   MRN: [id number] PATIENT ID: [name]  MRN: [id number]  DOB/AGE:  [birth year] / 57 y.o.  8 Days Post-Op Procedure(s) (LRB): TOTAL KNEE ARTHROPLASTY (Right)    PROGRESS NOTE Subjective: Patient is alert, oriented, no Nausea, no Vomiting, yes passing gas, no Bowel Movement. Taking PO well . Denies SOB, Chest or Calf Pain. Using Incentive Spirometer, PAS in place. Ambulate with PT, CPM 0-60 Patient reports pain as 5 on 0-10 scale  .    Objective: Vital signs in last 24 hours: Filed Vitals:   08/31/12 0724 08/31/12 0738 08/31/12 0848 08/31/12 1202  BP:  93/52 103/65 99/56  Pulse:  94 89 99  Temp:  97.4 F (36.3 C)  97.6 F (36.4 C)  TempSrc:  Oral  Oral  Resp:  17 16 18   Height:      Weight:      SpO2: 94% 97% 95% 98%      Intake/Output from previous day: I/O last 3 completed shifts: In: 1100 [P.O.:120; I.V.:380; IV Piggyback:600] Out: 800 [Urine:800]   Intake/Output this shift: Total I/O In: 40 [I.V.:40] Out: -    LABORATORY DATA:  Basename 08/31/12 1226 08/31/12 0737 08/31/12 0500 08/30/12 2251 08/30/12 0620  WBC -- -- 12.5* -- 16.8*  HGB -- -- 7.7* -- 8.1*  HCT -- -- 23.6* -- 24.6*  PLT -- -- 239 -- 223  NA -- -- 134* -- 134*  K -- -- 3.6 -- 3.6  CL -- -- 96 -- 99  CO2 -- -- 24 -- 21  BUN -- -- 15 -- 12  CREATININE -- -- 0.78 -- 0.71  GLUCOSE -- -- 178* -- 94  GLUCAP 223* 161* -- 120* --  INR -- -- -- -- --  CALCIUM -- -- 9.5 -- --    Examination: ABD soft Neurovascular intact Sensation intact distally Intact pulses distally Dorsiflexion/Plantar flexion intact Incision: no drainage}  Patient alert sitting in chair in step down unit.  She looks better today than yesterday.  Still drops sats quickly (while talking)  Still hypotensive but asymptomatic.  Anemia (HGB 7.7) asymptomatic   Assessment:   8 Days Post-Op Procedure(s) (LRB): TOTAL KNEE ARTHROPLASTY (Right) ADDITIONAL DIAGNOSIS:   Principal Problem:  *Hospital-acquired pneumonia Active Problems:  DIABETES MELLITUS II, UNCOMPLICATED  Unspecified vitamin D deficiency  HYPERLIPIDEMIA  OBESITY, NOS  BMI 33.8  ANXIETY  PANIC ATTACK  SLEEP APNEA, OBSTRUCTIVE, MODERATE  MIGRAINE, COMMON, INTRACTABLE  GLAUCOMA  COPD GOLD 0  GASTROESOPHAGEAL REFLUX, NO ESOPHAGITIS  HERNIA, HIATAL, NONCONGENITAL  Irritable bowel syndrome  OSTEOPENIA  CONVULSIONS, SEIZURES, NOS  Right knee DJD  Hx of bacterial pneumonia  Postoperative anemia due to acute blood loss   Plan: PT/OT WBAT, CPM 5/hrs day until ROM 0-60 degrees,  DVT Prophylaxis:  SCDx72hrs, Lovenox sub q BID Continue current treatment per critical care/pulmonary DISCHARGE PLAN: Home DISCHARGE NEEDS: HHPT and HHRN     Odelia Graciano J 08/31/2012, 2:22 PM

## 2012-08-31 NOTE — Progress Notes (Signed)
Physical Therapy Treatment Patient Details Name: [name] MRN: [id number] DOB: 04-26-1956 Today's Date: 08/31/2012 Time: 4098-1191 PT Time Calculation (min): 28 min  PT Assessment / Plan / Recommendation Comments on Treatment Session  Pt admitted s/p RTKA with ARDS and hypoxia yesterday with transfer to SDU. Pt currently lethargic on arrival with increased time and technique to arouse pt. Pt with increased unsteadiness today and decreased ambulation distance due to sats dropping to 78% on 35% venturi mask with return to 98% once seated. Pt with feet down end of session to eat breakfast but educated for knee precaution and need to extend knee once finished eating. Will continue to follow to progress mobility. If pt continues progression and has assist at home feel HHPT is still a viable plan pending respiratory status.     Follow Up Recommendations        Does the patient have the potential to tolerate intense rehabilitation     Barriers to Discharge        Equipment Recommendations       Recommendations for Other Services    Frequency     Plan Discharge plan remains appropriate;Frequency remains appropriate    Precautions / Restrictions Precautions Precautions: Knee;Fall Precaution Comments: KI D/C'd met SLR Knee Immobilizer - Right: Discontinue once straight leg raise with < 10 degree lag Restrictions RLE Weight Bearing: Weight bearing as tolerated   Pertinent Vitals/Pain 7/10 right knee pain after ambulation, none at rest, pt denied wanting medicine at this time    Mobility  Bed Mobility Bed Mobility: Supine to Sit Supine to Sit: 5: Supervision;With rails;HOB elevated (HOB 20degrees) Sitting - Scoot to Edge of Bed: 5: Supervision Details for Bed Mobility Assistance: increased time and cueing for lines for safety Transfers Sit to Stand: From bed;From chair/3-in-1;4: Min assist Stand to Sit: 4: Min guard;To chair/3-in-1 Stand Pivot Transfers: 4: Min assist Details for  Transfer Assistance: cueing for lines and safety with assist for stability standing from [facility] due to LOB x 1 and cueing for hand placement on Rw after standing. Pivot bed to [facility] prior to gait and return to recliner Ambulation/Gait Ambulation/Gait Assistance: 4: Min assist Ambulation Distance (Feet): 90 Feet Assistive device: Rolling walker Ambulation/Gait Assistance Details: cueing for breathing technique, to avoid obstacles, and to decrease speed. Pt somewhat impulsive with gait today and required assist due to LOB x 1 with gait.  Gait Pattern: Step-to pattern;Decreased stance time - right Gait velocity: too quick for safety at this time Stairs: No    Exercises Total Joint Exercises Heel Slides: AROM;Right;20 reps;Supine Hip ABduction/ADduction: AROM;Right;20 reps;Supine Straight Leg Raises: AROM;Right;20 reps;Supine   PT Diagnosis:    PT Problem List:   PT Treatment Interventions:     PT Goals Acute Rehab PT Goals PT Goal: Supine/Side to Sit - Progress: Progressing toward goal PT Goal: Sit to Stand - Progress: Progressing toward goal PT Goal: Stand to Sit - Progress: Progressing toward goal PT Goal: Ambulate - Progress: Progressing toward goal PT Goal: Perform Home Exercise Program - Progress: Progressing toward goal  Visit Information  Last PT Received On: 08/31/12 Assistance Needed: +1    Subjective Data  Subjective: "not really" in regards to if she recalls events of yesterday   Cognition  Overall Cognitive Status: Impaired Arousal/Alertness: Awake/alert Orientation Level: Appears intact for tasks assessed Behavior During Session: [facility] for tasks performed Current Attention Level: Selective Safety/Judgement: Decreased safety judgement for tasks assessed;Decreased awareness of need for assistance    Balance  End of Session PT - End of Session Equipment Utilized During Treatment: Gait belt Activity Tolerance: Patient tolerated treatment well Patient left: in  chair;with call bell/phone within reach Nurse Communication: Mobility status CPM Right Knee CPM Right Knee: Off   GP     Toney Sang Beth 08/31/2012, 10:32 AM Delaney Meigs, PT [phone]

## 2012-08-31 NOTE — Progress Notes (Signed)
Name: [name] MRN:  [id number] DOB:   [birth year]                LOS:   4   REFERRING PROVIDER:  [facility]   CHIEF COMPLAINT:  hypoxia   BRIEF PATIENT DESCRIPTION: 57 y/o F with PMH DM, Epilepsy, IBS, HTN, HLD, GERD, esophageal stricture, COPD with ongoing tobacco abuse, admit in 04/2012 for PNA.  Admitted on 1/13 for R total knee replacement.  Post replacement had abnormal cxr with bilateral diffuse patchy airspace disease.  Rx'd with aztreonam, vanco and continued to have fevers.  CT evaluated with bilateral patchy diffuse airspace disease. PCCM consulted for evaluation. Followed by Dr. Sherene Sires in office.   LINES / TUBES:  CULTURES: 1/17 Sputum>>> never sent 1/17 blood >> neg so far  ANTIBIOTICS: Aztreonam 1/15>>> Vanco 1/15>>> Levaquin 1/17>>>  SIGNIFICANT EVENTS:   1/13 - admit for R total knee 1/15 - post op cxr with bilateral airspace disease, rx'd with aztreonam / vanco 1/17 - CT with diffuse patchy bilateral airspace, PCCM consultation 1-20 tx to sdu for hypoxia, esr up 1/21- improved to 4 liters  Subjective:  Pt states she is much improved, denies sob.   Had fever 1-19.  Exam: Obese female in NAD MM pink/moist, Op clear Chest - resp's even/non-labored, lungs bilaterally with crackles, few scattered wheeze Cor with rrr abd benign LE with mild edema Alert and oriented, moves all 4.   ASSESSMENT / PLAN:  Bilat pulmonary infiltrates postop - presumed to be aspiration. 1-20 hypoxia, unclear etiology bilateral diffuse infiltrates -r/o aspiration peri operative and now ALI, r/o edema.  ABG with hypoxia on VM 1/20.  Troponin neg, BNP mildly elevated.  Statin discontinued.  Negative flu panel.  ANA neg, dsdna neg, C3 177, C4 38  ? PNA - r/o infectious, vs noninfectious, r/o transient bacteremia and now ALI Favoring Pneumonitis, noninfetious, ALI Plan: -lasix BID with KCL, increase slight -empiric steroids for noninfectious pneumonitis, improved since  1/20, remain -pending rf, anca, UA -if clinical status worsens, and ETT needed, would plan for bronch -continue abx, levofloxacin -BC neg to date -echo pending for veg  Hx COPD - no evidence of acute exacerbation  Plan: -on empiric steroids for bilateral airspace disease SSI -BD's  Anemia - dilutional? Or loss  Plan: No role Tx Lasix increase, cbc in am  Status post R Total Knee Replacement -CPM per ortho   Improved status, maintain steroids, lasix increase slight, echo pending  [name], NP-C [facility] Pgr: [phone] or [phone]  I have fully examined this patient and agree with above findings.    And edited in full  [name]. [name], MD, FACP Pgr: [phone] [facility]

## 2012-08-31 NOTE — Progress Notes (Signed)
ANTIBIOTIC CONSULT NOTE - Follow up   Pharmacy Consult for Vancomycin Indication: Suspected HCAP  Patient Measurements: Height: 5\' 1"  (154.9 cm) Weight: 183 lb 3.2 oz (83.1 kg) IBW/kg (Calculated) : 47.8   Vital Signs: Temp: 97.6 F (36.4 C) (01/21 1202) Temp src: Oral (01/21 1202) BP: 99/56 mmHg (01/21 1202) Pulse Rate: 99  (01/21 1202) Intake/Output from previous day: 01/20 0701 - 01/21 0700 In: 1100 [P.O.:120; I.V.:380; IV Piggyback:600] Out: 800 [Urine:800] Intake/Output from this shift: Total I/O In: 40 [I.V.:40] Out: -   Labs:  Basename 08/31/12 0500 08/30/12 0620  WBC 12.5* 16.8*  HGB 7.7* 8.1*  PLT 239 223  LABCREA -- --  CREATININE 0.78 0.71   Vancomycin trough = 16.8 mcg/mL  Assessment: [name] is s/p R TKA 1/13 on day#7 Vancomycin. Trough this morning is within the therapeutic range of 15-20.  Patient also receiving levofloxacin.  Renal function is stable.  Goal of Therapy:  Vancomycin trough level 15-20 mcg/ml  Plan:  Continue vancomycin 1g IV q12 and levofloxacin 750mg  IV q24.  Continue to follow renal function and clinical status.  Celedonio Miyamoto, PharmD, BCPS Clinical Pharmacist Pager [phone]  08/31/2012 1:37 PM

## 2012-08-31 NOTE — Progress Notes (Signed)
Occupational Therapy Treatment Patient Details Name: [name] MRN: [id number] DOB: 02/05/1956 Today's Date: 08/31/2012 Time: 4098-1191 OT Time Calculation (min): 25 min  OT Assessment / Plan / Recommendation Comments on Treatment Session Pt with quick desat during toilet transfer to 76 4L cannula. 2/4 dyspnea. Pt only ambulated @ 5 ft total distance. After sitting, O2 increased to 93.    Follow Up Recommendations  Home health OT    Barriers to Discharge       Equipment Recommendations  None recommended by OT    Recommendations for Other Services    Frequency Min 2X/week   Plan Discharge plan needs to be updated    Precautions / Restrictions Precautions Precautions: Knee;Fall Restrictions RLE Weight Bearing: Weight bearing as tolerated   Pertinent Vitals/Pain No c/o pain. See doc f;pw    ADL  Toilet Transfer: Min guard Toilet Transfer Method:  (ambulate @ 5 ft) Toilet Transfer Equipment: Bedside commode Toileting - Clothing Manipulation and Hygiene: Supervision/safety Where Assessed - Toileting Clothing Manipulation and Hygiene: Sit to stand from 3-in-1 or toilet Equipment Used: Gait belt;Rolling walker Transfers/Ambulation Related to ADLs: minguard RW level ADL Comments: O2 SATS drop to 76 O2 4L with toilet transfer  dyspnea 2/4    OT Diagnosis:    OT Problem List:   OT Treatment Interventions:     OT Goals Acute Rehab OT Goals OT Goal Formulation: With patient Time For Goal Achievement: 09/07/12 Potential to Achieve Goals: Good ADL Goals Pt Will Perform Grooming: with supervision;Standing at sink ADL Goal: Grooming - Progress: Progressing toward goals Pt Will Perform Lower Body Bathing: with min assist;with adaptive equipment ADL Goal: Lower Body Bathing - Progress: Progressing toward goals Pt Will Perform Lower Body Dressing: with min assist;with adaptive equipment ADL Goal: Lower Body Dressing - Progress: Progressing toward goals Pt Will Transfer to  Toilet: with supervision;with DME;Grab bars ADL Goal: Toilet Transfer - Progress: Progressing toward goals Pt Will Perform Toileting - Clothing Manipulation: with supervision;Standing;Sitting on 3-in-1 or toilet ADL Goal: Toileting - Clothing Manipulation - Progress: Progressing toward goals Pt Will Perform Toileting - Hygiene: with supervision;Sit to stand from 3-in-1/toilet;Sitting on 3-in-1 or toilet ADL Goal: Toileting - Hygiene - Progress: Progressing toward goals Pt Will Perform Tub/Shower Transfer: with supervision;with DME;Grab bars  Visit Information  Last OT Received On: 08/31/12 Assistance Needed: +1    Subjective Data      Prior Functioning       Cognition  Overall Cognitive Status: Impaired Area of Impairment: Safety/judgement;Awareness of errors Arousal/Alertness: Awake/alert Orientation Level: Appears intact for tasks assessed Behavior During Session: [facility] for tasks performed Cognition - Other Comments: cognition most likelu premorbid    Mobility  Shoulder Instructions Transfers Transfers: Sit to Stand;Stand to Sit Sit to Stand: 4: Min guard;With upper extremity assist;From chair/3-in-1 Stand to Sit: To chair/3-in-1 Details for Transfer Assistance: cueing for safety        Exercises      Balance  S    End of Session OT - End of Session Equipment Utilized During Treatment: Gait belt Activity Tolerance: Other (comment) (desat) Patient left: in chair;with call bell/phone within reach;with family/visitor present Nurse Communication: Mobility status;Other (comment) (O2 SATS)  GO     Jonh Mcqueary,HILLARY 08/31/2012, 3:55 PM [facility], OTR/L  [phone] 08/31/2012

## 2012-09-01 ENCOUNTER — Inpatient Hospital Stay (HOSPITAL_COMMUNITY): Payer: Medicaid Other

## 2012-09-01 DIAGNOSIS — Z780 Asymptomatic menopausal state: Secondary | ICD-10-CM

## 2012-09-01 DIAGNOSIS — F411 Generalized anxiety disorder: Secondary | ICD-10-CM

## 2012-09-01 LAB — COMPREHENSIVE METABOLIC PANEL
AST: 39 U/L — ABNORMAL HIGH (ref 0–37)
Albumin: 2.3 g/dL — ABNORMAL LOW (ref 3.5–5.2)
BUN: 25 mg/dL — ABNORMAL HIGH (ref 6–23)
Calcium: 9.5 mg/dL (ref 8.4–10.5)
Chloride: 99 mEq/L (ref 96–112)
Creatinine, Ser: 1.04 mg/dL (ref 0.50–1.10)
Total Bilirubin: 0.3 mg/dL (ref 0.3–1.2)
Total Protein: 6.8 g/dL (ref 6.0–8.3)

## 2012-09-01 LAB — GLUCOSE, CAPILLARY
Glucose-Capillary: 168 mg/dL — ABNORMAL HIGH (ref 70–99)
Glucose-Capillary: 113 mg/dL — ABNORMAL HIGH (ref 70–99)
Glucose-Capillary: 147 mg/dL — ABNORMAL HIGH (ref 70–99)
Glucose-Capillary: 142 mg/dL — ABNORMAL HIGH (ref 70–99)

## 2012-09-01 LAB — URINE CULTURE
Colony Count: NO GROWTH
Culture: NO GROWTH

## 2012-09-01 LAB — CBC WITH DIFFERENTIAL/PLATELET
Eosinophils Absolute: 0 10*3/uL (ref 0.0–0.7)
Hemoglobin: 7.7 g/dL — ABNORMAL LOW (ref 12.0–15.0)
Lymphocytes Relative: 5 % — ABNORMAL LOW (ref 12–46)
Lymphs Abs: 1.1 10*3/uL (ref 0.7–4.0)
Neutro Abs: 20.5 10*3/uL — ABNORMAL HIGH (ref 1.7–7.7)
Neutrophils Relative %: 90 % — ABNORMAL HIGH (ref 43–77)
Platelets: 318 10*3/uL (ref 150–400)
RBC: 2.49 MIL/uL — ABNORMAL LOW (ref 3.87–5.11)
WBC: 22.8 10*3/uL — ABNORMAL HIGH (ref 4.0–10.5)

## 2012-09-01 MED ORDER — INSULIN GLARGINE 100 UNIT/ML ~~LOC~~ SOLN
10.0000 [IU] | Freq: Every day | SUBCUTANEOUS | Status: DC
  Administered 2012-09-01 – 2012-09-02 (×2): 10 [IU] via SUBCUTANEOUS

## 2012-09-01 MED ORDER — POTASSIUM CHLORIDE CRYS ER 20 MEQ PO TBCR
40.0000 meq | EXTENDED_RELEASE_TABLET | Freq: Once | ORAL | Status: AC
  Administered 2012-09-01: 40 meq via ORAL
  Filled 2012-09-01: qty 2

## 2012-09-01 MED ORDER — MAGIC MOUTHWASH
5.0000 mL | Freq: Three times a day (TID) | ORAL | Status: DC
  Administered 2012-09-01 – 2012-09-03 (×7): 5 mL via ORAL
  Filled 2012-09-01 (×12): qty 5

## 2012-09-01 MED ORDER — METHYLPREDNISOLONE SODIUM SUCC 125 MG IJ SOLR
60.0000 mg | Freq: Three times a day (TID) | INTRAMUSCULAR | Status: DC
  Administered 2012-09-01 – 2012-09-02 (×2): 60 mg via INTRAVENOUS
  Filled 2012-09-01 (×5): qty 0.96

## 2012-09-01 NOTE — Progress Notes (Signed)
Physical Therapy Treatment Patient Details Name: [name] MRN: [id number] DOB: 1956-07-06 Today's Date: 09/01/2012 Time: 4098-1191 PT Time Calculation (min): 27 min  PT Assessment / Plan / Recommendation Comments on Treatment Session  Continues to demonstrate significant drop in O2 sats with ambulation on increased O2 for activity.  Question accuracy due to patient with only minimal signs of distress (min c/o right lateral flank pain with ambulation.)  Feel she mobilizes as safely as she will at home therefore agree she should be safe for plan to go home with HHPT.    Follow Up Recommendations  Home health PT;Supervision/Assistance - 24 hour           Equipment Recommendations  Rolling walker with 5" wheels       Frequency 7X/week   Plan Discharge plan remains appropriate    Precautions / Restrictions Precautions Precautions: Knee;Fall Restrictions Weight Bearing Restrictions: Yes RLE Weight Bearing: Weight bearing as tolerated   Pertinent Vitals/Pain C/o 10/10 pain, though no visible signs of distress RN aware; see notes for vitals    Mobility  Bed Mobility Supine to Sit: 5: Supervision Sitting - Scoot to Edge of Bed: 5: Supervision Transfers Sit to Stand: 5: Supervision;From chair/3-in-1;From bed;With armrests Stand to Sit: 4: Min guard;To chair/3-in-1;With armrests Ambulation/Gait Ambulation/Gait Assistance: 4: Min guard Ambulation Distance (Feet): 60 Feet Assistive device: Rolling walker Ambulation/Gait Assistance Details: Limited due to SpO2 reading 60's with activity despite placing patient on 35% VM to walk.  Question accuracy of monitor.  Pt. slightly antalgic (but not the 10/10 she reports,) able to walk and turn to get back to room without loss of balance, but still decreased safety with multiple lines and not waiting for me to fix them. Gait Pattern: Step-through pattern;Decreased stride length;Decreased stance time - right    Exercises Total Joint  Exercises Ankle Circles/Pumps: AROM;Both;10 reps;Seated Towel Squeeze: AROM;Both;10 reps;Seated Hip ABduction/ADduction: AROM;Right;15 reps;Seated Straight Leg Raises: AROM;Right;15 reps;Seated Long Arc Quad: AROM;Right;10 reps;Seated    PT Goals Acute Rehab PT Goals Time For Goal Achievement: 09/06/12 (updated 08/31/12) Potential to Achieve Goals: Good Pt will go Supine/Side to Sit: Independently PT Goal: Supine/Side to Sit - Progress: Progressing toward goal Pt will go Sit to Stand: with modified independence PT Goal: Sit to Stand - Progress: Progressing toward goal Pt will go Stand to Sit: with modified independence PT Goal: Stand to Sit - Progress: Progressing toward goal Pt will Ambulate: >150 feet;with rolling walker;with supervision PT Goal: Ambulate - Progress: Progressing toward goal (progress however limited due to respiratory status) PT Goal: Perform Home Exercise Program - Progress: Progressing toward goal  Visit Information  Last PT Received On: 09/01/12    Subjective Data  Subjective: Knee is doing well per doctor, just my lungs.   Cognition  Overall Cognitive Status: Impaired Area of Impairment: Safety/judgement Arousal/Alertness: Awake/alert Behavior During Session: WFL for tasks performed Safety/Judgement: Decreased safety judgement for tasks assessed;Impulsive;Decreased awareness of need for assistance Safety/Judgement - Other Comments: moves prior to adjusting lines for safety and with assist across the room    Balance  Balance Balance Assessed: Yes Dynamic Standing Balance Dynamic Standing - Balance Support: Left upper extremity supported Dynamic Standing - Level of Assistance: 5: Stand by assistance Dynamic Standing - Balance Activities: Forward lean/weight shifting Dynamic Standing - Comments: while performing hygiene after toileting and pulling up pants, one hand on armrest of 3:1  End of Session PT - End of Session Equipment Utilized During  Treatment: Gait belt Activity Tolerance: Treatment limited  secondary to medical complications (Comment) (dropping O2 sats) Patient left: in chair;with call bell/phone within reach   GP     [facility] 09/01/2012, 9:43 AM Sheran Lawless, PT [phone] 09/01/2012

## 2012-09-01 NOTE — Progress Notes (Signed)
Name: [name] MRN:  [id number] DOB:   August 22, 1955                LOS:   4   REFERRING PROVIDER:  [facility]   CHIEF COMPLAINT:  hypoxia   BRIEF PATIENT DESCRIPTION: 57 y/o F with PMH DM, Epilepsy, IBS, HTN, HLD, GERD, esophageal stricture, COPD with ongoing tobacco abuse, admit in 04/2012 for PNA.  Admitted on 1/13 for R total knee replacement.  Post replacement had abnormal cxr with bilateral diffuse patchy airspace disease.  Rx'd with aztreonam, vanco and continued to have fevers.  CT evaluated with bilateral patchy diffuse airspace disease. PCCM consulted for evaluation. Followed by Dr. Sherene Sires in office.   LINES / TUBES:  CULTURES: 1/17 Sputum>>> never sent 1/17 blood >> neg so far  ANTIBIOTICS: Aztreonam 1/15>>>1/21 Vanco 1/15>>>1/22 Levaquin 1/17>>>  SIGNIFICANT EVENTS:   1/13 - admit for R total knee 1/15 - post op cxr with bilateral airspace disease, rx'd with aztreonam / vanco 1/17 - CT with diffuse patchy bilateral airspace, PCCM consultation 1-20 tx to sdu for hypoxia, esr up 1/21- improved to 4 liters 1/22- neg balance, pcxr better, remains on lower O2 needs 1/20 - echo - 60% to 65%. Wall motion was normal; there were no regional wall motion abnormalities. Doppler parameters are consistent with abnormal left ventricular relaxation (grade 1 diastolic dysfunction). Pa 39   Subjective:  Less sob  Exam: Obese no distress Neck - obese, less jvd, line clean Chest - Improved coarseness Cor with rrr abd benign bs wnl LE with mild edema, knee clean wound Alert and oriented, moves all 4.   ASSESSMENT / PLAN:  Bilat pulmonary infiltrates postop - presumed to be aspiration. 1-20 hypoxia, unclear etiology bilateral diffuse infiltrates -r/o aspiration peri operative and now ALI, r/o edema.  ABG with hypoxia on VM 1/20.  Troponin neg, BNP mildly elevated.  Statin discontinued.  Negative flu panel.  ANA neg, dsdna neg, C3 177, C4 38, RF 19 (slight up)  ? PNA  - r/o infectious, vs noninfectious Favoring Pneumonitis, noninfetious, ALI Better with neg balance, r/o likely a diastolic heart failure component Plan: -lasix BID with KCL, maintain -empiric steroids for noninfectious pneumonitis, improved since 1/20, reduce frequency -pending anca only left -pcxr improved after neg balance, diastolic chf component likely, cvp 8, maintain lasix, control heart rate -will dc line in am likely -she is better daily   Anemia - dilutional? Or loss Plan: Lasix , hct to hemoconetrate , I hope dvt prvention per ortho  Status post R Total Knee Replacement -CPM per ortho  Hyperglycemia SSI lantus addition while on high dose steroids   Improved status, maintain steroids, lasix increase slight, echo pending   [name]. [name], MD, FACP Pgr: [phone] [facility]

## 2012-09-01 NOTE — Progress Notes (Signed)
Subjective: 9 Days Post-Op Procedure(s) (LRB): TOTAL KNEE ARTHROPLASTY (Right) Patient reports pain as 7 on 0-10 scale   She is complaining of proximal thigh pain.  Very little knee pain.  AROM 0-100.    Objective: Vital signs in last 24 hours: Temp:  [97.4 F (36.3 C)-98.1 F (36.7 C)] 97.6 F (36.4 C) (01/22 0530) Pulse Rate:  [76-99] 88  (01/22 0915) Resp:  [12-20] 20  (01/22 0915) BP: (97-110)/(46-62) 97/59 mmHg (01/22 0530) SpO2:  [69 %-100 %] 69 % (01/22 0915) FiO2 (%):  [35 %] 35 % (01/22 0915) Weight:  [85.1 kg (187 lb 9.8 oz)] 85.1 kg (187 lb 9.8 oz) (01/22 0530)  Intake/Output from previous day: 01/21 0701 - 01/22 0700 In: 685 [I.V.:480; IV Piggyback:205] Out: 1350 [Urine:1350] Intake/Output this shift: Total I/O In: 280 [P.O.:240; I.V.:40] Out: -    Basename 08/31/12 0500 08/30/12 0620  HGB 7.7* 8.1*    Basename 08/31/12 0500 08/30/12 0620  WBC 12.5* 16.8*  RBC 2.50* 2.62*  HCT 23.6* 24.6*  PLT 239 223    Basename 08/31/12 0500 08/30/12 0620  NA 134* 134*  K 3.6 3.6  CL 96 99  CO2 24 21  BUN 15 12  CREATININE 0.78 0.71  GLUCOSE 178* 94  CALCIUM 9.5 8.7   No results found for this basename: LABPT:2,INR:2 in the last 72 hours  Labs pending from today  ABD soft Neurovascular intact Sensation intact distally Intact pulses distally Dorsiflexion/Plantar flexion intact Incision: no drainage  Assessment/Plan: 9 Days Post-Op Procedure(s) (LRB): TOTAL KNEE ARTHROPLASTY (Right) Up with therapy Continue current care per critical care/pulmonary.  Will follow  [name] 09/01/2012, 11:33 AM

## 2012-09-02 ENCOUNTER — Inpatient Hospital Stay (HOSPITAL_COMMUNITY): Payer: Medicaid Other

## 2012-09-02 LAB — CBC WITH DIFFERENTIAL/PLATELET
Basophils Absolute: 0 10*3/uL (ref 0.0–0.1)
Eosinophils Relative: 0 % (ref 0–5)
HCT: 22.8 % — ABNORMAL LOW (ref 36.0–46.0)
Hemoglobin: 7.6 g/dL — ABNORMAL LOW (ref 12.0–15.0)
Lymphocytes Relative: 6 % — ABNORMAL LOW (ref 12–46)
Lymphs Abs: 1.1 10*3/uL (ref 0.7–4.0)
MCV: 93.1 fL (ref 78.0–100.0)
Monocytes Absolute: 0.8 10*3/uL (ref 0.1–1.0)
Neutro Abs: 16.9 10*3/uL — ABNORMAL HIGH (ref 1.7–7.7)
RBC: 2.45 MIL/uL — ABNORMAL LOW (ref 3.87–5.11)
RDW: 13.8 % (ref 11.5–15.5)
WBC: 18.8 10*3/uL — ABNORMAL HIGH (ref 4.0–10.5)

## 2012-09-02 LAB — GLUCOSE, CAPILLARY: Glucose-Capillary: 140 mg/dL — ABNORMAL HIGH (ref 70–99)

## 2012-09-02 LAB — BASIC METABOLIC PANEL
CO2: 25 mEq/L (ref 19–32)
Chloride: 100 mEq/L (ref 96–112)
Creatinine, Ser: 1.2 mg/dL — ABNORMAL HIGH (ref 0.50–1.10)
GFR calc Af Amer: 58 mL/min — ABNORMAL LOW (ref >90)
Potassium: 4.7 mEq/L (ref 3.5–5.1)
Sodium: 137 mEq/L (ref 135–145)

## 2012-09-02 LAB — CULTURE, BLOOD (ROUTINE X 2)

## 2012-09-02 MED ORDER — METHYLPREDNISOLONE SODIUM SUCC 40 MG IJ SOLR
40.0000 mg | Freq: Two times a day (BID) | INTRAMUSCULAR | Status: DC
  Administered 2012-09-02 – 2012-09-03 (×2): 40 mg via INTRAVENOUS
  Filled 2012-09-02 (×4): qty 1

## 2012-09-02 MED ORDER — LEVOFLOXACIN 750 MG PO TABS
750.0000 mg | ORAL_TABLET | Freq: Every day | ORAL | Status: DC
  Filled 2012-09-02: qty 1

## 2012-09-02 MED ORDER — LEVOFLOXACIN 750 MG PO TABS
750.0000 mg | ORAL_TABLET | ORAL | Status: DC
  Administered 2012-09-02: 750 mg via ORAL
  Filled 2012-09-02 (×2): qty 1

## 2012-09-02 NOTE — Progress Notes (Signed)
PHARMACIST - PHYSICIAN COMMUNICATION DR:   [name] CONCERNING: Antibiotic IV to Oral Route Change Policy  RECOMMENDATION: This patient is receiving levofloxacin by the intravenous route.  Based on criteria approved by the Pharmacy and Therapeutics Committee, the antibiotic(s) is/are being converted to the equivalent oral dose form(s).   DESCRIPTION: These criteria include:  Patient being treated for a respiratory tract infection, urinary tract infection, or cellulitis  The patient is not neutropenic and does not exhibit a GI malabsorption state  The patient is eating (either orally or via tube) and/or has been taking other orally administered medications for a least 24 hours  The patient is improving clinically and has a Tmax < 100.5  If you have questions about this conversion, please contact the Pharmacy Department  []   [phone] )  [facility] [x]   [phone] )  [facility]  []   [phone] )  [facility] []   [phone] )  Ilene Qua   Celedonio Miyamoto, PharmD, BCPS Clinical Pharmacist Pager [phone]

## 2012-09-02 NOTE — Progress Notes (Signed)
Orthopedic Tech Progress Note Patient Details:  [name] 10/12/1955 [id number] Patient put in CPM at 11:15am CPM Right Knee CPM Right Knee: On Right Knee Flexion (Degrees): 60  Right Knee Extension (Degrees): 0    [name] 09/02/2012, 11:25 AM

## 2012-09-02 NOTE — Progress Notes (Signed)
Subjective: 10 Days Post-Op Procedure(s) (LRB): TOTAL KNEE ARTHROPLASTY (Right) Patient reports pain as 2 on 0-10 scale.   Still having SOB.  Pt is improving Objective: Vital signs in last 24 hours: Temp:  [97.4 F (36.3 C)-97.8 F (36.6 C)] 97.4 F (36.3 C) (01/23 0329) Pulse Rate:  [62-92] 62  (01/23 0400) Resp:  [11-26] 11  (01/23 0400) BP: (92-111)/(46-82) 95/57 mmHg (01/23 0329) SpO2:  [69 %-99 %] 93 % (01/23 0400) FiO2 (%):  [35 %] 35 % (01/22 0915) Weight:  [90.1 kg (198 lb 10.2 oz)] 90.1 kg (198 lb 10.2 oz) (01/22 2345)  Intake/Output from previous day: 01/22 0701 - 01/23 0700 In: 1408 [P.O.:960; I.V.:440; IV Piggyback:8] Out: 1400 [Urine:1400] Intake/Output this shift: Total I/O In: -  Out: 950 [Urine:950]   Basename 09/02/12 0445 09/01/12 1630 08/31/12 0500  HGB 7.6* 7.7* 7.7*    Basename 09/02/12 0445 09/01/12 1630  WBC 18.8* 22.8*  RBC 2.45* 2.49*  HCT 22.8* 23.0*  PLT 318 318    Basename 09/02/12 0445 09/01/12 1630  NA 137 137  K 4.7 4.0  CL 100 99  CO2 25 26  BUN 31* 25*  CREATININE 1.20* 1.04  GLUCOSE 160* 136*  CALCIUM 9.3 9.5   No results found for this basename: LABPT:2,INR:2 in the last 72 hours  ABD soft Neurovascular intact Sensation intact distally Intact pulses distally Dorsiflexion/Plantar flexion intact Incision: no drainage  Assessment/Plan: 10 Days Post-Op Procedure(s) (LRB): TOTAL KNEE ARTHROPLASTY (Right) Principal Problem:  *Hospital-acquired pneumonia Active Problems:  DIABETES MELLITUS II, UNCOMPLICATED  Unspecified vitamin D deficiency  HYPERLIPIDEMIA  OBESITY, NOS  BMI 33.8  ANXIETY  PANIC ATTACK  SLEEP APNEA, OBSTRUCTIVE, MODERATE  MIGRAINE, COMMON, INTRACTABLE  GLAUCOMA  COPD GOLD 0  GASTROESOPHAGEAL REFLUX, NO ESOPHAGITIS  HERNIA, HIATAL, NONCONGENITAL  Irritable bowel syndrome  OSTEOPENIA  CONVULSIONS, SEIZURES, NOS  Right knee DJD  Hx of bacterial pneumonia  Postoperative anemia due to acute blood  loss  Up with therapy Continue management per critical care  Richardo Popoff J 09/02/2012, 8:22 AM

## 2012-09-02 NOTE — Progress Notes (Signed)
o2 sat 85 without o2. O2 2 L [place] applied sats back up to 98%

## 2012-09-02 NOTE — Progress Notes (Signed)
Name: [name] MRN:  [id number] DOB:   Jan 02, 1956                LOS:   4   REFERRING PROVIDER:  [facility]   CHIEF COMPLAINT:  hypoxia   BRIEF PATIENT DESCRIPTION: 57 y/o F with PMH DM, Epilepsy, IBS, HTN, HLD, GERD, esophageal stricture, COPD with ongoing tobacco abuse, admit in 04/2012 for PNA.  Admitted on 1/13 for R total knee replacement.  Post replacement had abnormal cxr with bilateral diffuse patchy airspace disease.  Rx'd with aztreonam, vanco and continued to have fevers.  CT evaluated with bilateral patchy diffuse airspace disease. PCCM consulted for evaluation. Followed by Dr. Sherene Sires in office.   LINES / TUBES:  CULTURES: 1/17 Sputum>>> never sent 1/17 blood >> neg so far  ANTIBIOTICS: Aztreonam 1/15>>>1/21 Vanco 1/15>>>1/22 Levaquin 1/17>>>  SIGNIFICANT EVENTS:   1/13 - admit for R total knee 1/15 - post op cxr with bilateral airspace disease, rx'd with aztreonam / vanco 1/17 - CT with diffuse patchy bilateral airspace, PCCM consultation 1-20 tx to sdu for hypoxia, esr up 1/21- improved to 4 liters 1/22- neg balance, pcxr better, remains on lower O2 needs 1/20 - echo - 60% to 65%. Wall motion was normal; there were no regional wall motion abnormalities. Doppler parameters are consistent with abnormal left ventricular relaxation (grade 1 diastolic dysfunction). Pa 39 1/23 - improved O2 needs, cxr improving  Subjective:  Pt denies complaints, feels better: ON RA today !!  Exam: Obese no distress Neck - obese, less jvd, line clean Chest - Improved coarseness Cor with rrr abd benign bs wnl LE with mild edema, knee clean wound Alert and oriented, moves all 4.   ASSESSMENT / PLAN:  Bilat pulmonary infiltrates postop - presumed to be aspiration. 1-20 hypoxia, unclear etiology bilateral diffuse infiltrates -r/o aspiration peri operative and now ALI, r/o edema.  ABG with hypoxia on VM 1/20.  Troponin neg, BNP mildly elevated.  Statin discontinued.   Negative flu panel.  ANA neg, dsdna neg, C3 177, C4 38, RF 19 (slight up)  ? PNA - r/o infectious, vs noninfectious.  Favoring Pneumonitis, noninfetious, ALI.  Better with neg balance, r/o likely a diastolic heart failure component  Plan: -d/c lasix, consider on daily basis hereafter -empiric steroids for noninfectious pneumonitis, improved since 1/20, reduce to 40 Q12 1/23 - i think she benefited from this -pending anca only left -pcxr improved with neg balance, diastolic chf component likely -we have maxed lasix, remainder is infiltrates -continue central line for now, poor peripheral access -ambulate if ortho allows and check sats  Anemia - dilutional? Or loss  Plan: D/c lasix dvt prvention per ortho  Status post R Total Knee Replacement -CPM per ortho -would like for her to ambulate  Hyperglycemia SSI lantus addition while on high dose steroids  Transfer back to Ortho floor, d/c lasix.  F/u bmp, cbc, cxr in am.  If PIV then dc line ( update: unable, keep till dc)  I called her sister to update her as pt requested, left message Updated husband 1/22  Veronica Brim, NP-C [facility] Pgr: [phone] or [phone]  I have fully examined this patient and agree with above findings.    And edited infull  [name]. [name], MD, FACP Pgr: [phone] [place] [facility]

## 2012-09-02 NOTE — Progress Notes (Signed)
Occupational Therapy Treatment Patient Details Name: [name] MRN: [id number] DOB: Mar 14, 1956 Today's Date: 09/02/2012 Time: 4098-1191 OT Time Calculation (min): 14 min  OT Assessment / Plan / Recommendation Comments on Treatment Session Pt making excellent progress toward goals.  Focus of next session on tub transfer.    Follow Up Recommendations  Home health OT    Barriers to Discharge       Equipment Recommendations  None recommended by OT    Recommendations for Other Services    Frequency Min 2X/week   Plan Discharge plan remains appropriate    Precautions / Restrictions Precautions Precautions: Knee;Fall Restrictions Weight Bearing Restrictions: Yes RLE Weight Bearing: Weight bearing as tolerated   Pertinent Vitals/Pain See vitals    ADL  Eating/Feeding: Performed;Independent Where Assessed - Eating/Feeding: Chair Lower Body Bathing: Simulated;Supervision/safety Where Assessed - Lower Body Bathing: Unsupported sitting Lower Body Dressing: Performed;Supervision/safety Where Assessed - Lower Body Dressing: Unsupported sitting Toilet Transfer: Simulated;Supervision/safety Toilet Transfer Method: Sit to stand Toilet Transfer Equipment: Other (comment) (chair) Equipment Used: Rolling walker Transfers/Ambulation Related to ADLs: pt performing sit<>stand at supervision level.  Pt had just finished with PT session and declining ambulation within room. ADL Comments: O2 sat remaining >90% on 2L during chair level tasks and sit<>stand at chair.  Pt able to sit EOC and lean anteriorly to don/doff socks with no difficulty.  Pt reports she does have  a shower chair at home that she had started using prior to sx due to R knee pain.  Educated pt on performing majority of LB dressing/bathing tasks seated not only to decrease fall risk but also to conserve energy.      OT Diagnosis:    OT Problem List:   OT Treatment Interventions:     OT Goals ADL Goals Pt Will Perform Lower  Body Bathing: with supervision;Sit to stand from bed;Sit to stand from chair;Unsupported ADL Goal: Lower Body Bathing - Progress: Updated due to goal met Pt Will Perform Lower Body Dressing: with supervision;Sit to stand from chair;Sit to stand from bed ADL Goal: Lower Body Dressing - Progress: Updated due to goal met Pt Will Transfer to Toilet: with supervision;with DME;Grab bars ADL Goal: Toilet Transfer - Progress: Progressing toward goals  Visit Information  Last OT Received On: 09/02/12 Assistance Needed: +1    Subjective Data      Prior Functioning       Cognition  Overall Cognitive Status: Appears within functional limits for tasks assessed/performed Arousal/Alertness: Awake/alert Orientation Level: Appears intact for tasks assessed Behavior During Session: [facility] for tasks performed    Mobility  Shoulder Instructions Bed Mobility Bed Mobility: Not assessed Supine to Sit: 6: Modified independent (Device/Increase time);HOB elevated;With rails (HOB 30degrees) Transfers Transfers: Sit to Stand;Stand to Sit Sit to Stand: 5: Supervision;With armrests;With upper extremity assist;From chair/3-in-1 Stand to Sit: 5: Supervision;To chair/3-in-1;With upper extremity assist;With armrests Details for Transfer Assistance: VC for safe hand placement        Exercises    Balance     End of Session OT - End of Session Equipment Utilized During Treatment:  (RW) Activity Tolerance: Patient tolerated treatment well Patient left: in chair;with call bell/phone within reach Nurse Communication: Other (comment) (IV beeping)   GO    09/02/2012 Cipriano Mile OTR/L Pager [phone] Office [phone]  Cipriano Mile 09/02/2012, 9:36 AM

## 2012-09-02 NOTE — Progress Notes (Signed)
Physical Therapy Treatment Patient Details Name: [name] MRN: [id number] DOB: [birth year] Today's Date: 09/02/2012 Time: 4098-1191 PT Time Calculation (min): 39 min  PT Assessment / Plan / Recommendation Comments on Treatment Session  Pt admitted with R TKA and now with ARDS with continued need for O2 with mobility as sats dropping to 88% on 4L with gait with long hall ambulation. Pt encouraged for IS every hour and to continue mobility with nursing staff. ROM R knee is excellent and encouraged to continue HEP. Pt with heel elevated end of session    Follow Up Recommendations  Home health PT;Supervision for mobility/OOB     Does the patient have the potential to tolerate intense rehabilitation     Barriers to Discharge        Equipment Recommendations       Recommendations for Other Services    Frequency     Plan Discharge plan remains appropriate;Frequency remains appropriate    Precautions / Restrictions Precautions Precautions: Knee;Fall Restrictions RLE Weight Bearing: Weight bearing as tolerated   Pertinent Vitals/Pain sats 97% on 2L at rest before and after tx Drop to 82% on RA with 50'ambulation sats 90-94% on 4L with long hall ambulation with drop to 88% end of gait HR 85-106 throughout Pain 10/10 Right knee per pt but does not demonstrate pain at this level, repositioned    Mobility  Bed Mobility Supine to Sit: 6: Modified independent (Device/Increase time);HOB elevated;With rails (HOB 30degrees) Transfers Sit to Stand: 5: Supervision;From bed;From chair/3-in-1 Stand to Sit: 5: Supervision;To chair/3-in-1 Details for Transfer Assistance: cueing for hand placement and safety with transfer bed to [facility] and return to recliner after ambulation, pt with tendency to keep hands on RW Ambulation/Gait Ambulation/Gait Assistance: 5: Supervision Ambulation Distance (Feet): 250 Feet Assistive device: Rolling walker Ambulation/Gait Assistance Details: 3 standing rest  breaks with cueing for breathing technique to recover sats. Pt with drop to 82% after 50' on RA with 4L able to maintain 90 majority of ambulation with drop to 88% at end of ambulation with return to 94% with standing rest and cueing Gait Pattern: Step-through pattern;Decreased stride length;Decreased stance time - right Gait velocity: too quick for respiratory status and safety at this time with cueing to decrease speed Stairs: No    Exercises Total Joint Exercises Ankle Circles/Pumps: AROM;Both;20 reps;Seated Heel Slides: AROM;Right;20 reps;Seated Hip ABduction/ADduction: AROM;Right;20 reps;Seated Straight Leg Raises: AROM;Right;20 reps;Seated Long Arc Quad: AROM;Right;20 reps;Seated   PT Diagnosis:    PT Problem List:   PT Treatment Interventions:     PT Goals Acute Rehab PT Goals PT Goal: Supine/Side to Sit - Progress: Progressing toward goal PT Goal: Sit to Stand - Progress: Progressing toward goal PT Goal: Stand to Sit - Progress: Progressing toward goal Pt will Ambulate: >150 feet;with modified independence;with least restrictive assistive device PT Goal: Ambulate - Progress: Updated due to goal met PT Goal: Perform Home Exercise Program - Progress: Progressing toward goal  Visit Information  Last PT Received On: 09/02/12 Assistance Needed: +1    Subjective Data  Subjective: I'm feeling better   Cognition  Overall Cognitive Status: Appears within functional limits for tasks assessed/performed Arousal/Alertness: Awake/alert Orientation Level: Appears intact for tasks assessed Behavior During Session: [facility] for tasks performed    Balance     End of Session PT - End of Session Activity Tolerance: Patient tolerated treatment well Patient left: in chair;with call bell/phone within reach Nurse Communication: Mobility status CPM Right Knee CPM Right Knee: Off  GP     Toney Sang Beth 09/02/2012, 8:39 AM Delaney Meigs, PT [phone]

## 2012-09-03 ENCOUNTER — Inpatient Hospital Stay (HOSPITAL_COMMUNITY): Payer: Medicaid Other

## 2012-09-03 DIAGNOSIS — R0902 Hypoxemia: Secondary | ICD-10-CM | POA: Diagnosis not present

## 2012-09-03 DIAGNOSIS — R918 Other nonspecific abnormal finding of lung field: Secondary | ICD-10-CM

## 2012-09-03 LAB — CBC
HCT: 23.7 % — ABNORMAL LOW (ref 36.0–46.0)
Hemoglobin: 7.7 g/dL — ABNORMAL LOW (ref 12.0–15.0)
MCH: 30.6 pg (ref 26.0–34.0)
MCHC: 32.5 g/dL (ref 30.0–36.0)
RDW: 14.3 % (ref 11.5–15.5)

## 2012-09-03 LAB — BASIC METABOLIC PANEL
BUN: 28 mg/dL — ABNORMAL HIGH (ref 6–23)
Chloride: 104 mEq/L (ref 96–112)
Creatinine, Ser: 1.12 mg/dL — ABNORMAL HIGH (ref 0.50–1.10)
GFR calc non Af Amer: 55 mL/min — ABNORMAL LOW (ref >90)
Glucose, Bld: 98 mg/dL (ref 70–99)
Potassium: 4.2 mEq/L (ref 3.5–5.1)

## 2012-09-03 LAB — GLUCOSE, CAPILLARY
Glucose-Capillary: 94 mg/dL (ref 70–99)
Glucose-Capillary: 128 mg/dL — ABNORMAL HIGH (ref 70–99)

## 2012-09-03 MED ORDER — ALBUTEROL SULFATE (5 MG/ML) 0.5% IN NEBU: 2.5000 mg | INHALATION_SOLUTION | RESPIRATORY_TRACT | Status: DC | PRN

## 2012-09-03 MED ORDER — FUROSEMIDE 40 MG PO TABS
40.0000 mg | ORAL_TABLET | Freq: Once | ORAL | Status: AC
  Administered 2012-09-03: 40 mg via ORAL
  Filled 2012-09-03 (×2): qty 1

## 2012-09-03 MED ORDER — DSS 100 MG PO CAPS: 100.0000 mg | ORAL_CAPSULE | Freq: Two times a day (BID) | ORAL | Status: DC

## 2012-09-03 MED ORDER — FUROSEMIDE 40 MG PO TABS
40.0000 mg | ORAL_TABLET | Freq: Every day | ORAL | Status: DC
  Administered 2012-09-04 – 2012-09-06 (×3): 40 mg via ORAL
  Filled 2012-09-03 (×3): qty 1

## 2012-09-03 MED ORDER — ACETAMINOPHEN 325 MG PO TABS: 650.0000 mg | ORAL_TABLET | Freq: Four times a day (QID) | ORAL | Status: DC | PRN

## 2012-09-03 MED ORDER — POTASSIUM CHLORIDE CRYS ER 20 MEQ PO TBCR
20.0000 meq | EXTENDED_RELEASE_TABLET | Freq: Every day | ORAL | Status: DC
  Administered 2012-09-04 – 2012-09-06 (×3): 20 meq via ORAL
  Filled 2012-09-03 (×3): qty 1

## 2012-09-03 MED ORDER — OXYCODONE HCL 5 MG PO TABS: ORAL_TABLET | ORAL | Status: DC

## 2012-09-03 MED ORDER — POTASSIUM CHLORIDE CRYS ER 20 MEQ PO TBCR
40.0000 meq | EXTENDED_RELEASE_TABLET | Freq: Once | ORAL | Status: AC
  Administered 2012-09-03: 40 meq via ORAL

## 2012-09-03 MED ORDER — TIOTROPIUM BROMIDE MONOHYDRATE 18 MCG IN CAPS
18.0000 ug | ORAL_CAPSULE | Freq: Every day | RESPIRATORY_TRACT | Status: DC
  Filled 2012-09-03: qty 5

## 2012-09-03 NOTE — Progress Notes (Signed)
Name: [name] MRN:  [id number] DOB:   October 27, 1955                LOS:   4   REFERRING PROVIDER:  [facility]  CHIEF COMPLAINT:  hypoxia  BRIEF PATIENT DESCRIPTION: 57 y/o F with PMH DM, Epilepsy, IBS, HTN, HLD, GERD, esophageal stricture, COPD with ongoing tobacco abuse, admit in 04/2012 for PNA.  Admitted on 1/13 for R total knee replacement.  Post replacement had abnormal cxr with bilateral diffuse patchy airspace disease.  Rx'd with aztreonam, vanco and continued to have fevers.  CT evaluated with bilateral patchy diffuse airspace disease. PCCM consulted for evaluation. Followed by Dr. Sherene Sires in office.   LINES / TUBES:  CULTURES: 1/17 blood >> neg   ANTIBIOTICS: Aztreonam 1/15>>>1/21 Vanco 1/15>>>1/22 Levaquin 1/17 >> 1/24  SIGNIFICANT EVENTS:   1/13 - admit for R total knee 1/15 - post op cxr with bilateral airspace disease, rx'd with aztreonam / vanco 1/17 - CT with diffuse patchy bilateral airspace, PCCM consultation 1-20 tx to sdu for hypoxia, esr up 1/21- improved to 4 liters 1/22- neg balance, pcxr better, remains on lower O2 needs 1/20 - echo - 60% to 65%. Wall motion was normal; there were no regional wall motion abnormalities. Doppler parameters are consistent with abnormal left ventricular relaxation (grade 1 diastolic dysfunction). Pa 39 1/23 - improved O2 needs, cxr improving    Subjective:  No distress. Wishes to go home  Exam: Obese no distress Neck - obese, less jvd, line clean Chest - Bibasilar rales Heart: RRR s M abd benign bs wnl LE trace ankle edema Alert and oriented, moves all 4.   ASSESSMENT / PLAN:  Bilat pulmonary infiltrates postop - presumed to be aspiration vs edema. Hypoxemia with exertion persists 1/24 Doubt active infection presently  Plan: -Lasix X 1 today -D/C steroids -ANCA pending - doubt vasculitis -Mobilize  Anemia - dilutional? Or loss No indication for PRBCs   Status post R Total Knee Replacement -Mgmt  per Ortho   Hyperglycemia D/C insulin now that she is off steroids   Transfer to med-surg. If still desaturating with ambulation, will need to arrange home O2   Billy Fischer, MD ; [facility] [phone].  After 5:30 PM or weekends, call [phone]

## 2012-09-03 NOTE — Progress Notes (Signed)
Report called to [employer] on unit [address] [place] .

## 2012-09-03 NOTE — Progress Notes (Deleted)
Pt is ready for discharge to home She will be discharged on her usual home regimen of meds No further abx No further steroids D/C insulin - done. Should not need off steroids She will also need some analgesic for post op pain F/U with ortho F/U with NP in [facility] in 1-2 wks - CXR @ that time F/U with primary care - FPTS   Billy Fischer, MD ; [facility] service Mobile [phone].  After 5:30 PM or weekends, call [phone]

## 2012-09-03 NOTE — Care Management (Signed)
Pt placed on CPAP for the night. Tol well at this time 

## 2012-09-03 NOTE — Progress Notes (Signed)
Physical Therapy Treatment Patient Details Name: [name] MRN: [id number] DOB: 02/27/1956 Today's Date: 09/03/2012 Time: 7829-5621 PT Time Calculation (min): 28 min  PT Assessment / Plan / Recommendation Comments on Treatment Session  Pt admitted with R TKA and now with ARDS with continued need for O2 with mobility able to maintain 89-94% on 2L with activity. Pt continues to drop sats to 80% during coughing episodes but maintains 99% at rest on 2L. Continued encouragement for hall ambulation with nursing throughout the day, continued HEP and IS use. Pt progressing well and limited only by respiratory status. Will attempt further ambulation without DME next session. heel roll end of session    Follow Up Recommendations        Does the patient have the potential to tolerate intense rehabilitation     Barriers to Discharge        Equipment Recommendations       Recommendations for Other Services    Frequency     Plan Discharge plan remains appropriate;Frequency remains appropriate    Precautions / Restrictions Precautions Precautions: Knee Restrictions RLE Weight Bearing: Weight bearing as tolerated   Pertinent Vitals/Pain 5/10 right knee pain after activity, repositioned    Mobility  Bed Mobility Bed Mobility: Supine to Sit Supine to Sit: 6: Modified independent (Device/Increase time);HOB elevated (HOB 40degrees) Transfers Sit to Stand: 6: Modified independent (Device/Increase time);From bed Stand to Sit: 6: Modified independent (Device/Increase time);To chair/3-in-1;With armrests Ambulation/Gait Ambulation/Gait Assistance: 5: Supervision Ambulation Distance (Feet): 400 Feet Assistive device: Rolling walker;None Ambulation/Gait Assistance Details: Pt ambulated 350' with RW then last 51' without AD with increased antalgic gait and decreased stride without DME. Pt with 3 standing rest breaks to recover sats. Gait Pattern: Step-through pattern;Decreased stride  length;Antalgic Gait velocity: decreased Stairs: No    Exercises Total Joint Exercises Heel Slides: AROM;Right;Other reps (comment);Seated (25) Hip ABduction/ADduction: AROM;Right;Other reps (comment);Seated (25) Straight Leg Raises: AROM;Right;Other reps (comment);Seated (25) Long Arc Quad: AROM;Right;Other reps (comment);Seated (25)   PT Diagnosis:    PT Problem List:   PT Treatment Interventions:     PT Goals Acute Rehab PT Goals PT Goal: Supine/Side to Sit - Progress: Progressing toward goal PT Goal: Sit to Stand - Progress: Met PT Goal: Stand to Sit - Progress: Met PT Goal: Ambulate - Progress: Progressing toward goal PT Goal: Perform Home Exercise Program - Progress: Met  Visit Information  Last PT Received On: 09/03/12 Assistance Needed: +1    Subjective Data  Subjective: my chest hurts a little   Cognition  Overall Cognitive Status: Appears within functional limits for tasks assessed/performed Arousal/Alertness: Awake/alert Orientation Level: Appears intact for tasks assessed Behavior During Session: [facility] for tasks performed    Balance     End of Session PT - End of Session Activity Tolerance: Patient tolerated treatment well Patient left: in chair;with call bell/phone within reach Nurse Communication: Mobility status CPM Right Knee CPM Right Knee: Off   GP     Toney Sang [facility] 09/03/2012, 8:53 AM Delaney Meigs, PT [phone]

## 2012-09-03 NOTE — Progress Notes (Signed)
Occupational Therapy Treatment Patient Details Name: [name] MRN: [id number] DOB: 1955/09/10 Today's Date: 09/03/2012 Time: 2130-8657 OT Time Calculation (min): 25 min  OT Assessment / Plan / Recommendation Comments on Treatment Session Pt completing ADLs and functional mobility at supervision level with RW.    Follow Up Recommendations  Home health OT    Barriers to Discharge       Equipment Recommendations  None recommended by OT    Recommendations for Other Services    Frequency Min 2X/week   Plan Discharge plan remains appropriate;All goals met and education completed, patient discharged from OT services    Precautions / Restrictions Precautions Precautions: Knee Restrictions Weight Bearing Restrictions: Yes RLE Weight Bearing: Weight bearing as tolerated   Pertinent Vitals/Pain sats >90% on 2L ambulating within room    ADL  Grooming: Performed;Wash/dry hands;Supervision/safety Where Assessed - Grooming: Unsupported standing Lower Body Bathing: Simulated;Supervision/safety Where Assessed - Lower Body Bathing: Unsupported sit to stand Lower Body Dressing: Performed;Supervision/safety Where Assessed - Lower Body Dressing: Unsupported sit to stand Toilet Transfer: Performed;Supervision/safety Toilet Transfer Method: Sit to Barista: Comfort height toilet Toileting - Clothing Manipulation and Hygiene: Performed;Supervision/safety Where Assessed - Engineer, mining and Hygiene: Sit to stand from 3-in-1 or toilet Tub/Shower Transfer: Simulated;Min guard Tub/Shower Transfer Method: Ambulating Equipment Used: Rolling walker (O2 2L) Transfers/Ambulation Related to ADLs: Supervision with RW and O2 2L ADL Comments: Min guard for tub transfer simulation in room for safety.  Pt reports her husband will be assisting her with tub transfer and that she prefers to sponge bathe initially. Pt completing all tasks at supervision level with 2L O2.     OT Diagnosis:    OT Problem List:   OT Treatment Interventions:     OT Goals ADL Goals Pt Will Perform Grooming: with supervision;Standing at sink ADL Goal: Grooming - Progress: Met Pt Will Perform Lower Body Bathing: with supervision;Sit to stand from bed;Sit to stand from chair;Unsupported ADL Goal: Lower Body Bathing - Progress: Met Pt Will Perform Lower Body Dressing: with supervision;Sit to stand from chair;Sit to stand from bed ADL Goal: Lower Body Dressing - Progress: Met Pt Will Transfer to Toilet: with supervision;with DME;Grab bars ADL Goal: Toilet Transfer - Progress: Met Pt Will Perform Toileting - Clothing Manipulation: with supervision;Standing;Sitting on 3-in-1 or toilet ADL Goal: Toileting - Clothing Manipulation - Progress: Met Pt Will Perform Toileting - Hygiene: with supervision;Sit to stand from 3-in-1/toilet;Sitting on 3-in-1 or toilet ADL Goal: Toileting - Hygiene - Progress: Met Pt Will Perform Tub/Shower Transfer: with supervision;with DME;Grab bars ADL Goal: Tub/Shower Transfer - Progress: Discontinued (comment) (at min guard level- appropriate for home with assist)  Visit Information  Last OT Received On: 09/03/12 Assistance Needed: +1    Subjective Data      Prior Functioning       Cognition  Overall Cognitive Status: Appears within functional limits for tasks assessed/performed Arousal/Alertness: Awake/alert Orientation Level: Appears intact for tasks assessed Behavior During Session: [facility] for tasks performed    Mobility  Shoulder Instructions Bed Mobility Bed Mobility: Supine to Sit;Sitting - Scoot to Edge of Bed Supine to Sit: 6: Modified independent (Device/Increase time) Sitting - Scoot to Edge of Bed: 7: Independent Transfers Transfers: Sit to Stand;Stand to Sit Sit to Stand: 6: Modified independent (Device/Increase time);From bed;From toilet;With upper extremity assist Stand to Sit: 6: Modified independent (Device/Increase time);To  chair/3-in-1;To toilet;With upper extremity assist;With armrests       Exercises      Balance  End of Session OT - End of Session Equipment Utilized During Treatment:  (O2 2L) Activity Tolerance: Patient tolerated treatment well Patient left: in chair;with call bell/phone within reach Nurse Communication: Mobility status  GO    09/03/2012 Cipriano Mile OTR/L Pager [phone] Office [phone]  Cipriano Mile 09/03/2012, 4:32 PM

## 2012-09-04 DIAGNOSIS — R918 Other nonspecific abnormal finding of lung field: Secondary | ICD-10-CM

## 2012-09-04 DIAGNOSIS — D62 Acute posthemorrhagic anemia: Secondary | ICD-10-CM

## 2012-09-04 DIAGNOSIS — G4733 Obstructive sleep apnea (adult) (pediatric): Secondary | ICD-10-CM

## 2012-09-04 LAB — CULTURE, BLOOD (ROUTINE X 2): Culture: NO GROWTH

## 2012-09-04 LAB — CBC
Hemoglobin: 8.8 g/dL — ABNORMAL LOW (ref 12.0–15.0)
MCH: 30.3 pg (ref 26.0–34.0)
MCHC: 31.7 g/dL (ref 30.0–36.0)
Platelets: 382 10*3/uL (ref 150–400)
RBC: 2.9 MIL/uL — ABNORMAL LOW (ref 3.87–5.11)

## 2012-09-04 MED ORDER — FERROUS SULFATE 325 (65 FE) MG PO TABS
325.0000 mg | ORAL_TABLET | Freq: Two times a day (BID) | ORAL | Status: DC
  Administered 2012-09-04 – 2012-09-06 (×5): 325 mg via ORAL
  Filled 2012-09-04 (×8): qty 1

## 2012-09-04 NOTE — Progress Notes (Signed)
[facility]opaedic Tra[facility] (OTS)  Subjective: 12 Days Post-Op Procedure(s) (LRB): TOTAL KNEE ARTHROPLASTY (Right)  From an ortho standpoint pt doing well Has been off O2 since last night States she has been walking around room and feels fine  R knee doing well  Most recent O2 sat was 99% on RA at 0641 this am   Objective: Current Vitals Blood pressure 116/48, pulse 75, temperature 97.2 F (36.2 C), temperature source Oral, resp. rate 18, height 5\' 1"  (1.549 m), weight 90.1 kg (198 lb 10.2 oz), SpO2 99.00%. Vital signs in last 24 hours: Temp:  [97.2 F (36.2 C)-98 F (36.7 C)] 97.2 F (36.2 C) (01/25 0641) Pulse Rate:  [73-82] 75  (01/25 0641) Resp:  [18-22] 18  (01/25 0641) BP: (93-116)/(44-52) 116/48 mmHg (01/25 0641) SpO2:  [91 %-99 %] 99 % (01/25 0641)  Intake/Output from previous day: 01/24 0701 - 01/25 0700 In: 240 [P.O.:240] Out: 500 [Urine:500]  LABS  Basename 09/03/12 0636 09/02/12 0445 09/01/12 1630  HGB 7.7* 7.6* 7.7*    Basename 09/03/12 0636 09/02/12 0445  WBC 14.5* 18.8*  RBC 2.52* 2.45*  HCT 23.7* 22.8*  PLT 343 318    Basename 09/03/12 0636 09/02/12 0445  NA 140 137  K 4.2 4.7  CL 104 100  CO2 28 25  BUN 28* 31*  CREATININE 1.12* 1.20*  GLUCOSE 98 160*  CALCIUM 9.2 9.3   No results found for this basename: LABPT:2,INR:2 in the last 72 hours   Physical Exam  XBJ:YNWGNFA in bedside chair, appears comfortable Lungs:crackles at bases Cardiac:s1 and s2 Abd: + BS Ext:      Right Lower extremity  Incision looks great, healed  Distal motor and sensory functions intact  Ext warm   + DP pulse  No DCT  Compartments soft and NT    Imaging Dg Chest Port 1 View  09/03/2012  *RADIOLOGY REPORT*  Clinical Data: Shortness of breath.  Chest pain.  PORTABLE CHEST - 1 VIEW  Comparison: 09/02/2012  Findings: Central line is unchanged.  Heart size and vascularity are normal.  The  primarily upper lobe infiltrates are essentially unchanged.  No  effusions.  IMPRESSION: Faint bilateral upper lobe pulmonary infiltrates are unchanged.   Original Report Authenticated By: Francene Boyers, M.D.     Assessment/Plan: 12 Days Post-Op Procedure(s) (LRB): TOTAL KNEE ARTHROPLASTY (Right)  57 y/o female s/p R TKA pod 12, post op ARDS  1. R TKA pod 12  WBAT  PT/OT  ROM as tolerated  Pt may shower 2. Pulmonary issues  Per CCM/pulm  Pt to mobilize with therapy and check sats with activity  Agree with concern for potential complications with hypoxia with concomitant anemia.    3. Anemia  Pt started on ferrous sulfate   F/u with pcp 4. DVT/PE prophylaxis  lovenox 5. Continue per Pulmonary 6. dispo  F/u sats during mobilization  Discharge per Pulm  Ortho issues stable  F/u with [facility] within 1 week of d/c     Mearl Latin, PA-C [facility] [phone] (P) 09/04/2012, 9:34 AM

## 2012-09-04 NOTE — Progress Notes (Signed)
Name: [name] MRN:  [id number] DOB:   Apr 09, 1956                LOS:   4   REFERRING PROVIDER:  [facility]  CHIEF COMPLAINT:  hypoxia  BRIEF PATIENT DESCRIPTION: 57 y/o F with PMH DM, Epilepsy, IBS, HTN, HLD, GERD, esophageal stricture, COPD with ongoing tobacco abuse, admit in 04/2012 for PNA.  Admitted on 1/13 for R total knee replacement.  Post replacement had abnormal cxr with bilateral diffuse patchy airspace disease.  Rx'd with aztreonam, vanco and continued to have fevers.  CT evaluated with bilateral patchy diffuse airspace disease. PCCM consulted for evaluation. Followed by Dr. Sherene Sires in office.   LINES / TUBES:  CULTURES: 1/17 blood >> neg   ANTIBIOTICS: Aztreonam 1/15>>>1/21 Vanco 1/15>>>1/22 Levaquin 1/17 >> 1/24  SIGNIFICANT EVENTS:   1/13 - admit for R total knee 1/15 - post op cxr with bilateral airspace disease, rx'd with aztreonam / vanco 1/17 - CT with diffuse patchy bilateral airspace, PCCM consultation 1-20 tx to sdu for hypoxia, esr up 1/21- improved to 4 liters 1/22- neg balance, pcxr better, remains on lower O2 needs 1/20 - echo - 60% to 65%. Wall motion was normal; there were no regional wall motion abnormalities. Doppler parameters are consistent with abnormal left ventricular relaxation (grade 1 diastolic dysfunction). Pa 39 1/23 - improved O2 needs, cxr improving    Subjective: No new complaints. Has been off O2 since last night, ambulatory in room. Denies cough.  States she is going to stop smoking.Uses CPAP every night at home, [facility], Unk pressure. Denies prior hx of anemia.  Exam: Obese no distress Neck - obese, less jvd, line clean Chest - Shallow with bibasilar crackle Heart: RRR s M abd benign bs wnl LE trace ankle edema. Healing R knee incision. Alert and oriented, moves all 4.   ASSESSMENT / PLAN:  Bilat pulmonary infiltrates postop - presumed to be aspiration vs edema. Off antibiotics. She had outpatient CT  followed by Dr Sherene Sires with some patchy interstitial infiltrates. Obesity -hypoventilation, anemia and smoking habit complicate this. Plan- recheck ambulatory O2 sat. Not comfortable with combination of hypoxia and anemia, esp if she resumes smoking.  Plan: -ANCA pending - doubt vasculitis -Mobilize  Anemia - dilutional? Or loss No indication for PRBCs, but persistent hgb 7-8 range.  P- FESO4, outpt f/u  Status post R Total Knee Replacement -Mgmt per Ortho   Hyperglycemia D/C'd insulin now that she is off steroids  Obstructive Sleep Apnea She will continue home CPAP/[facility] at discharge    If still desaturating with ambulation, may need to arrange home O2   CD Maple Hudson, MD PCCM [phone].  After 5:30 PM or weekends, call [phone]

## 2012-09-05 ENCOUNTER — Inpatient Hospital Stay (HOSPITAL_COMMUNITY): Payer: Medicaid Other

## 2012-09-05 DIAGNOSIS — R0902 Hypoxemia: Secondary | ICD-10-CM

## 2012-09-05 LAB — CULTURE, BLOOD (ROUTINE X 2)
Culture: NO GROWTH
Culture: NO GROWTH

## 2012-09-05 LAB — GLUCOSE, CAPILLARY: Glucose-Capillary: 97 mg/dL (ref 70–99)

## 2012-09-05 MED ORDER — MORPHINE SULFATE 2 MG/ML IJ SOLN
2.0000 mg | Freq: Once | INTRAMUSCULAR | Status: AC
  Administered 2012-09-05: 2 mg via INTRAVENOUS
  Filled 2012-09-05: qty 1

## 2012-09-05 MED ORDER — LAMOTRIGINE 150 MG PO TABS
150.0000 mg | ORAL_TABLET | Freq: Two times a day (BID) | ORAL | Status: DC
  Administered 2012-09-05 – 2012-09-06 (×4): 150 mg via ORAL
  Filled 2012-09-05 (×5): qty 1

## 2012-09-05 NOTE — Progress Notes (Signed)
Name: [name] MRN:  [id number] DOB:   Feb 28, 1956                LOS:   4   REFERRING PROVIDER:  [facility]  CHIEF COMPLAINT:  hypoxia  BRIEF PATIENT DESCRIPTION: 57 y/o F with PMH DM, Epilepsy, IBS, HTN, HLD, GERD, esophageal stricture, COPD with ongoing tobacco abuse, admit in 04/2012 for PNA.  Admitted on 1/13 for R total knee replacement.  Post replacement had abnormal cxr with bilateral diffuse patchy airspace disease.  Rx'd with aztreonam, vanco and continued to have fevers.  CT evaluated with bilateral patchy diffuse airspace disease. PCCM consulted for evaluation. Followed by Dr. Sherene Sires in office.   LINES / TUBES:  CULTURES: 1/17 blood >> neg   ANTIBIOTICS: Aztreonam 1/15>>>1/21 Vanco 1/15>>>1/22 Levaquin 1/17 >> 1/24  SIGNIFICANT EVENTS:   1/13 - admit for R total knee 1/15 - post op cxr with bilateral airspace disease, rx'd with aztreonam / vanco 1/17 - CT with diffuse patchy bilateral airspace, PCCM consultation 1-20 tx to sdu for hypoxia, esr up 1/21- improved to 4 liters 1/22- neg balance, pcxr better, remains on lower O2 needs 1/20 - echo - 60% to 65%. Wall motion was normal; there were no regional wall motion abnormalities. Doppler parameters are consistent with abnormal left ventricular relaxation (grade 1 diastolic dysfunction). Pa 39 1/23 - improved O2 needs, cxr improving    Subjective: Admits deep cough is baseline for her, sometimes productive. Has home neb and inhalers. Has no f/u appt at this time with Dr Sherene Sires.  Exam: BP 104/57  Pulse 72  Temp 98.2 F (36.8 C) (Oral)  Resp 20  Ht 5\' 1"  (1.549 m)  Wt 198 lb 10.2 oz (90.1 kg)  BMI 37.53 kg/m2  SpO2 95% on O2 2L  Obese no distress. Up in room,  on room air, adjusting blinds. Calm , alert and oriented Neck - No stridor, voice clear. Chest - Deep, raspy cough, few scattered rhonchi, no wheeze. Not labored after few steps with walker. Heart: RRR, mild tachy, s M abd -benign bs wnl LE trace  ankle edema. Healing R knee incision.moves all 4. No cyanosis  ASSESSMENT / PLAN:  Bilat pulmonary infiltrates postop - presumed to be aspiration vs edema. Off antibiotics. She had outpatient CT followed by Dr Sherene Sires with some patchy interstitial infiltrates. Probable chronic bronchitis.  Plan: -ANCA pending - doubt vasculitis -Mobilize -CXR 1/26 -Will need f/u appt w/ Dr Sherene Sires Pulmonary after discharge  Obesity -hypoventilation, - probable, based on body habitus anemia and smoking habit complicate this.  Plan: -Mobilize  Anemia - dilutional? Or loss. 1/26- up a gram, favoring dilutional component, correcting. No indication for PRBCs, but persistent hgb 7-8 range.  P-  Continue FESO4, outpt f/u  Status post R Total Knee Replacement -Mgmt per Ortho   Hyperglycemia D/C'd insulin now that she is off steroids  Obstructive Sleep Apnea She will continue home CPAP/[facility] at discharge   CD Maple Hudson, MD PCCM [phone].  After 5:30 PM or weekends, call [phone]

## 2012-09-05 NOTE — Progress Notes (Signed)
Pt placed on CPAP via FFM, Pt tolerating well at this time, RT to monitor and assess as needed.

## 2012-09-05 NOTE — Progress Notes (Signed)
Pts O2 sats are 91% resting, while ambulating without O2, pts saturation was 82%.  Pt remained stable asymptomatic, walked 200 feet with walker.  When returning to room saturation returned to 95% and increased to 96% on RA.  MD notified of results.

## 2012-09-06 ENCOUNTER — Other Ambulatory Visit: Payer: Self-pay | Admitting: Family Medicine

## 2012-09-06 DIAGNOSIS — Z01818 Encounter for other preprocedural examination: Secondary | ICD-10-CM

## 2012-09-06 LAB — BASIC METABOLIC PANEL
Calcium: 8.9 mg/dL (ref 8.4–10.5)
GFR calc Af Amer: 63 mL/min — ABNORMAL LOW (ref >90)
GFR calc non Af Amer: 55 mL/min — ABNORMAL LOW (ref >90)
Glucose, Bld: 107 mg/dL — ABNORMAL HIGH (ref 70–99)
Potassium: 3.7 mEq/L (ref 3.5–5.1)
Sodium: 137 mEq/L (ref 135–145)

## 2012-09-06 LAB — CBC WITH DIFFERENTIAL/PLATELET
Hemoglobin: 8.9 g/dL — ABNORMAL LOW (ref 12.0–15.0)
Lymphocytes Relative: 24 % (ref 12–46)
Lymphs Abs: 3 10*3/uL (ref 0.7–4.0)
MCH: 30.3 pg (ref 26.0–34.0)
Monocytes Relative: 6 % (ref 3–12)
Neutro Abs: 8.2 10*3/uL — ABNORMAL HIGH (ref 1.7–7.7)
Neutrophils Relative %: 66 % (ref 43–77)
Platelets: 347 10*3/uL (ref 150–400)
RBC: 2.94 MIL/uL — ABNORMAL LOW (ref 3.87–5.11)
WBC: 12.5 10*3/uL — ABNORMAL HIGH (ref 4.0–10.5)

## 2012-09-06 MED ORDER — TIOTROPIUM BROMIDE MONOHYDRATE 18 MCG IN CAPS: 18.0000 ug | ORAL_CAPSULE | Freq: Every day | RESPIRATORY_TRACT | Status: DC

## 2012-09-06 MED ORDER — FERROUS SULFATE 325 (65 FE) MG PO TABS: 325.0000 mg | ORAL_TABLET | Freq: Two times a day (BID) | ORAL | Status: DC

## 2012-09-06 MED ORDER — FLUTICASONE PROPIONATE 50 MCG/ACT NA SUSP: 2.0000 | Freq: Every day | NASAL | Status: DC | PRN

## 2012-09-06 MED ORDER — ACETAMINOPHEN 325 MG PO TABS: 650.0000 mg | ORAL_TABLET | Freq: Four times a day (QID) | ORAL | Status: DC | PRN

## 2012-09-06 MED ORDER — OMEPRAZOLE 40 MG PO CPDR: 40.0000 mg | DELAYED_RELEASE_CAPSULE | Freq: Two times a day (BID) | ORAL | Status: DC

## 2012-09-06 MED ORDER — OXYCODONE HCL 5 MG PO TABS: ORAL_TABLET | ORAL | Status: DC

## 2012-09-06 NOTE — Progress Notes (Signed)
SATURATION QUALIFICATIONS: (This note is used to comply with regulatory documentation for home oxygen)  Patient Saturations on Room Air at Rest = 91%  Patient Saturations on Room Air while Ambulating = 79%  Patient Saturations on 2 Liters of oxygen while Ambulating = 95%  Please briefly explain why patient needs home oxygen: 02 sats drop below 90% while ambulating

## 2012-09-06 NOTE — Progress Notes (Signed)
Patient resting saturations 90 - 94%.  Ambulated with walker and saturations decreased to 79% after approx 30 ft of slow walking. Saturations increased to 89-90% with deep breathing and rest.    Plan: -home O2 at 2L with activity / during day -can review in office after acute period to determine if continues to need oxygen   [name], NP-C [facility] Pgr: [phone] or [phone]

## 2012-09-06 NOTE — Discharge Summary (Signed)
Physician Discharge Summary  Patient ID: [name] MRN: [id number] DOB/AGE: 02/21/1956 57 y.o.  Admit date: 08/23/2012 Discharge date: 09/06/2012    Discharge Diagnoses:   OSTEOPENIA Right knee DJD Postoperative anemia due to acute blood loss Diffuse bilateral Infiltrates / Hospital-acquired pneumonia OSA Hypoxemia  COPD Tobacco Abuse Obesity GERD  Hiatial Hernia Diabetes  Hyperlipidemia Anxiety Panic Attacks Migranes Hx of Seizures     Brief Summary: [name] is a 57 y.o. y/o female with a PMH of DM, Epilepsy, IBS, HTN, HLD, GERD, esophageal stricture, COPD with ongoing tobacco abuse, admit in 04/2012 for PNA and has been followed by Dr. Sherene Sires for bilateral infiltrates.  Admitted on 1/13 for R total knee replacement. Post replacement had significantly abnormal cxr with bilateral diffuse patchy airspace disease. Rx'd with aztreonam, vanco and continued to have fevers. CT evaluated with diffuse bilateral patchy airspace disease. Patient had significant hypoxemia associated with airspace disease.  Working diagnosis of intra-operative aspiration vs bacterial pneumonia with compounding edema.  Patient cultures are negative to date.  Auto immune work up essentially negative.  Note she did have mildly positive rheumatoid factor (19) but was thought not to be contributing factor.  ANCA is pending but doubt vasculitis.  She was treated with aggressive antibiotics for bilateral infiltrates, IV diuresis, nebulized bronchodilators and IV steroids.  Bilateral infiltrates and hypoxemia slowly improved.  Prior to discharge, she was ambulated and noted saturations decreased to 79% after 30 feet of slow walking.  She will require 2L O2 with activity.       LINES / TUBES:  R IJ TLC 1/20>>>1/24  CULTURES:  1/17 blood >> neg  1/9 BCx2>>>neg 1/20 BCx2>>>neg 1/20 UC>>>neg  ANTIBIOTICS:  Aztreonam 1/15>>>1/21  Vanco 1/15>>>1/22  Levaquin 1/17 >> 1/24   SIGNIFICANT EVENTS:  1/13 - admit  for R total knee  1/15 - post op cxr with bilateral airspace disease, rx'd with aztreonam / vanco  1/17 - CT with diffuse patchy bilateral airspace, PCCM consultation  1-20 tx to sdu for hypoxia, esr up  1/21- improved to 4 liters  1/22- neg balance, pcxr better, remains on lower O2 needs  1/20 - echo - 60% to 65%. Wall motion was normal; there were no regional wall motion abnormalities. Doppler parameters are consistent with abnormal left ventricular relaxation (grade 1 diastolic dysfunction). 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 Thurston Hole.    Discharge Plan: -follow up with Dr. Thurston Hole on 1/30 -CPM / Post op Ortho recommendations - see instructions below   Diffuse bilateral Infiltrates / Hospital-acquired pneumonia OSA Hypoxemia  COPD Tobacco Abuse Admitted on 1/13 for R total knee replacement. Post replacement had significantly abnormal cxr with bilateral diffuse patchy airspace disease. Rx'd with aztreonam, vanco and continued to have fevers. PCCM consulted 1/17 for worsening infiltrates despite antibiotics.  CT evaluated with diffuse bilateral patchy airspace disease. Patient had significant hypoxemia associated with airspace disease.  Working diagnosis of  intra-operative aspiration vs bacterial pneumonia with compounding edema.  Patient cultures are negative to date.  Auto immune work up essentially negative.  Note she did have mildly positive rheumatoid factor (19) but was thought not to be contributing factor.  ANCA is pending but doubt vasculitis.  She was treated with aggressive antibiotics for bilateral infiltrates, IV diuresis, nebulized bronchodilators and IV steroids.  Bilateral infiltrates and hypoxemia slowly improved.  Prior to  discharge, she was ambulated and noted saturations decreased to 79% after 30 feet of slow walking.  She will require 2L O2 with activity.  Of note, there was concern that patient was smoking in the hospital.  Discharge Plan: -smoking cessation -O2 2L with activity (new at d/c) -continue home CPAP as previously ordered -follow up with Rubye Oaks, NP for CXR review / hospital follow up -will need re-eval to determine if continued O2 needs, likely in Feb when she follows up with Dr. Sherene Sires -continue spiriva, albuterol, flonase  Postoperative anemia due to acute blood loss Noted Hgb drop thought likely component of dilution and post op blood loss.  She was self correcting prior to d/c.  Did not require transfusion.    Discharge Plan: -Continue FESO4, outpt f/u    Obesity GERD  Hiatial Hernia Diabetes  Hyperlipidemia Hyperglycemia in setting of steroid use for bilateral infiltrates.  Resolved with discontinue of steroids.   Discharge Plan: -pt to continue to check blood sugar, she indicates she is diet controlled -continue prilosec BID   Anxiety Panic Attacks Migranes Hx of Seizures No changes made during admit.  Continue previous home medications.     Discharge Exam: Obese no distress. Up in room, on room air, adjusting blinds. Calm , alert and oriented  Neck - No stridor, voice clear.  Chest - Deep, raspy cough, few scattered rhonchi, no wheeze. Not labored after few steps with walker.  Heart: RRR, mild tachy, s M  abd -benign bs wnl  LE trace ankle edema. Healing R knee incision.moves all 4. No cyanosis   Filed Vitals:   09/05/12 1447 09/05/12 2242 09/06/12 0618 09/06/12 0846  BP: 98/53 96/53 103/59   Pulse: 81 67 93   Temp: 97.9 F (36.6 C) 98.3 F (36.8 C) 98.3 F (36.8 C)   TempSrc: Oral Oral Oral   Resp: 16 18 16    Height:      Weight:      SpO2: 96% 94% 94% 90%    Discharge Labs  BMET  Lab 09/06/12 0646 09/03/12 0636 09/02/12 0445 09/01/12 1630  08/31/12 0500  NA 137 140 137 137 134*  K 3.7 4.2 -- -- --  CL 97 104 100 99 96  CO2 30 28 25 26 24   GLUCOSE 107* 98 160* 136* 178*  BUN 20 28* 31* 25* 15  CREATININE 1.12* 1.12* 1.20* 1.04 0.78  CALCIUM 8.9 9.2 9.3 9.5 9.5  MG -- -- 2.2 -- 2.0  PHOS -- -- 4.6 -- 4.7*   CBC  Lab 09/06/12 0646 09/04/12 0947 09/03/12 0636  HGB 8.9* 8.8* 7.7*  HCT 27.9* 27.8* 23.7*  WBC 12.5* 15.3* 14.5*  PLT 347 382 343    Discharge Orders    Future Appointments: Provider: Department: Dept Phone: Center:   09/10/2012 2:15 PM Julio Sicks, NP [facility] [phone] None   10/06/2012 9:45 AM Nyoka Cowden, MD [facility] [phone] None     Future Orders Please Complete By Expires   For home use only DME oxygen  Comments:   With activity   Questions: Responses:   Mode or (Route) Nasal cannula   Liters per Minute 2   Frequency    Oxygen conserving device    Diet - low sodium heart healthy      Call MD / Call 911      Comments:   If you experience chest pain or shortness of breath, CALL 911 and be transported to the hospital emergency room.  If you develope a fever above 101 F, pus (white drainage) or increased drainage or redness at the wound, or calf pain, call your surgeon's office.   Discharge instructions      Comments:   Total Knee Replacement Care After Refer to this sheet in the next few weeks. These discharge instructions provide you with general information on caring for yourself after you leave the hospital. Your caregiver may also give you specific instructions. Your treatment has been planned according to the most current medical practices available, but unavoidable complications sometimes occur. If you have any problems or questions after discharge, please call your caregiver. Regaining a near full range of motion of your knee within the first 3 to 6 weeks after surgery is critical. HOME CARE INSTRUCTIONS  You may resume a normal diet and activities  as directed.  Perform exercises as directed.  Place yellow foam block, yellow side up under heel at all times except when in CPM or when walking.  DO NOT modify, tear, cut, or change in any way. You will receive physical therapy daily  Take showers instead of baths until informed otherwise.  Change bandages (dressings)daily Do not take over-the-counter or prescription medicines for pain, discomfort, or fever. Eat a well-balanced diet.  Avoid lifting or driving until you are instructed otherwise.  Make an appointment to see your caregiver for stitches (suture) or staple removal as directed.  If you have been sent home with a continuous passive motion machine (CPM machine), 0-90 degrees 6 hrs a day   2 hrs a shift SEEK MEDICAL CARE IF: You have swelling of your calf or leg.  You develop shortness of breath or chest pain.  You have redness, swelling, or increasing pain in the wound.  There is pus or any unusual drainage coming from the surgical site.  You notice a bad smell coming from the surgical site or dressing.  The surgical site breaks open after sutures or staples have been removed.  There is persistent bleeding from the suture or staple line.  You are getting worse or are not improving.  You have any other questions or concerns.  SEEK IMMEDIATE MEDICAL CARE IF:  You have a fever.  You develop a rash.  You have difficulty breathing.  You develop any reaction or side effects to medicines given.  Your knee motion is decreasing rather than improving.  MAKE SURE YOU:  Understand these instructions.  Will watch your condition.  Will get help right away if you are not doing well or get worse.   Constipation Prevention      Comments:   Drink plenty of fluids.  Prune juice may be helpful.  You may use a stool softener, such as Colace (over the counter) 100 mg twice a day.  Use MiraLax (over the counter) for constipation as needed.   Increase activity slowly as tolerated      CPM       Comments:   Continuous passive motion machine (CPM):      Use the CPM from  0 to 90 for 6 hours per day.       You may break it up into 2 or 3 sessions per day.      Use CPM for 2 weeks or until you are told to stop.   TED hose      Comments:   Use stockings (TED hose) for 2 weeks on both leg(s).  You may remove them at night for sleeping.   Change dressing      Comments:   Change the dressing daily with sterile 4 x 4 inch gauze dressing and apply TED hose.  You may clean the incision with alcohol prior to redressing.   Do not put a pillow under the knee. Place it under the heel.      Comments:   Place yellow foam block, yellow side up under heel at all times except when in CPM or when walking.  DO NOT modify, tear, cut, or change in any way the yellow foam block.   Discharge instructions      Comments:   Wear 2L Oxygen with activity (up moving around and while out of house).       Follow-up Information    Follow up with Nilda Simmer, MD. On 09/09/2012. (appt time 2pm)    Contact information:   [address] [place] [address] [phone]       Follow up with Sandrea Hughs, MD. On 10/06/2012. (Appt at 9:45)    Contact information:   [address] [place] [address] [phone]       Follow up with PARRETT,TAMMY, NP. On 09/10/2012. (Appt at 2:15.  Arrive at 2pm for CXR)    Contact information:   [address] [place] [address] [phone]             Medication List     As of 09/06/2012 11:13 AM    START taking these medications         DSS 100 MG Caps   Take 100 mg by mouth 2 (two) times daily.      ferrous sulfate 325 (65 FE) MG tablet   Take 1 tablet (325 mg total) by mouth 2 (two) times daily with a meal.      oxyCODONE 5 MG immediate release tablet   Commonly known as: Oxy IR/ROXICODONE   1-2 tablets every 4-6 hrs as needed for pain      CHANGE how you take these medications         acetaminophen 325 MG tablet    Commonly known as: TYLENOL   Take 2 tablets (650 mg total) by mouth every 6 (six) hours as needed for pain or fever (greater than 101). For pain   What changed: - dose - reasons to take the med      CONTINUE taking these medications         * albuterol (2.5 MG/3ML) 0.083% nebulizer solution   Commonly known as: PROVENTIL      * PROAIR HFA 108 (90 BASE) MCG/ACT inhaler   Generic drug: albuterol      Blood Glucose Meter kit   Use to check blood glucose daily.  Accuchek preffered by patient      BLOOD GLUCOSE TEST STRIPS Strp   Use to test blood glucose daily.      dicyclomine 20 MG tablet   Commonly known as: BENTYL   Take 1 tablet (20mg ) by mouth four times daily- Before meals and at bedtime  fluticasone 50 MCG/ACT nasal spray   Commonly known as: FLONASE      lamoTRIgine 150 MG tablet   Commonly known as: LAMICTAL   Take 1 tablet (150 mg total) by mouth 2 (two) times daily.      omeprazole 40 MG capsule   Commonly known as: PRILOSEC      QUEtiapine 50 MG tablet   Commonly known as: SEROQUEL      simvastatin 40 MG tablet   Commonly known as: ZOCOR   Take 1 tablet (40 mg total) by mouth at bedtime.      tiotropium 18 MCG inhalation capsule   Commonly known as: SPIRIVA      topiramate 100 MG tablet   Commonly known as: TOPAMAX      vitamin C 100 MG tablet      Vitamin D3 2000 UNITS Tabs     * Notice: This list has 2 medication(s) that are the same as other medications prescribed for you. Read the directions carefully, and ask your doctor or other care provider to review them with you.    STOP taking these medications         HYDROcodone-acetaminophen 5-325 MG per tablet   Commonly known as: NORCO/VICODIN      hydrocortisone 25 MG suppository   Commonly known as: ANUSOL-HC      hydrocortisone cream 1 %      hydrOXYzine 50 MG tablet   Commonly known as: ATARAX/VISTARIL      loratadine 10 MG tablet   Commonly known as: CLARITIN      meloxicam 7.5 MG  tablet   Commonly known as: MOBIC      varenicline 1 MG tablet   Commonly known as: CHANTIX          Where to get your medications    These are the prescriptions that you need to pick up.   You may get these medications from any pharmacy.         acetaminophen 325 MG tablet   DSS 100 MG Caps   ferrous sulfate 325 (65 FE) MG tablet   oxyCODONE 5 MG immediate release tablet              Disposition: 01-Home or Self Care.  Discharge with [facility] for O2, walker.    Discharged Condition: [name] has met maximum benefit of inpatient care and is medically stable and cleared for discharge.  Patient is pending follow up as above.      Time spent on disposition:  Greater than 35 minutes.   Signed: [name], MSN, NP-C [facility] Pgr: [phone]

## 2012-09-10 ENCOUNTER — Ambulatory Visit (INDEPENDENT_AMBULATORY_CARE_PROVIDER_SITE_OTHER)
Admission: RE | Admit: 2012-09-10 | Discharge: 2012-09-10 | Disposition: A | Discharge status: Home / Self Care | Payer: Medicaid Other | Source: Ambulatory Visit | Attending: Adult Health | Admitting: Adult Health

## 2012-09-10 ENCOUNTER — Ambulatory Visit (INDEPENDENT_AMBULATORY_CARE_PROVIDER_SITE_OTHER): Payer: Medicaid Other | Admitting: Adult Health

## 2012-09-10 ENCOUNTER — Encounter: Payer: Self-pay | Admitting: Adult Health

## 2012-09-10 VITALS — BP 106/72 | HR 75 | Temp 97.9°F | Ht 61.0 in | Wt 177.0 lb

## 2012-09-10 DIAGNOSIS — J189 Pneumonia, unspecified organism: Secondary | ICD-10-CM

## 2012-09-10 DIAGNOSIS — J449 Chronic obstructive pulmonary disease, unspecified: Secondary | ICD-10-CM

## 2012-09-10 DIAGNOSIS — Y95 Nosocomial condition: Secondary | ICD-10-CM

## 2012-09-10 NOTE — Patient Instructions (Addendum)
Keep up good work with not smoking  Continue on current regimen.  Wear O2 with activity and As needed   Follow up Dr. Sherene Sires  In 2 weeks and As needed   Please contact office for sooner follow up if symptoms do not improve or worsen or seek emergency care

## 2012-09-10 NOTE — Progress Notes (Signed)
Subjective:    Patient ID: [name], female    DOB: August 21, 1955 MRN: [id number]  HPI  39 yowf active smoker with dx of copd aware of sob around 2000 [facility] with exacerbation every few months just [facility] in 04/2012 for ? CAP with aecopd referred by Dr Wyline Mood for preop for R TKR Jan 13 14   06/18/2012 Pulmonary preop cc doe x one aisle and since last flare using inhaler saba maybe twice weekly and sleeping ok on cpap maintained on spiriva and can't take advair due to mouth irritation.  Min am cough/ congestion with thick white mucus.  rec Try to minimize smoking Try tudorza one puff twice daily instead of spiriva on trial basis for 2 weeks - call if you like it better and we'll send you a prescription     08/06/2012 f/u ov/Wert still smoking cc not limitedby breathing but by knees,  thinks tudorza works better but only using once a day. >>no changes   09/10/2012 Post Hospital follow up  Patient presents for a post hospital followup. She was admitted January 13-27, for elective right total knee replacement. She had postop complications with hypoxia. CT chest revealed diffuse bilateral patchy airspace disease. It was felt. Patient had possible intraoperative aspiration versus bacterial pneumonia with compounding edema. Autoimmune workup was essentially negative. She was treated with aggressive antibiotics, IV diuresis, nebulized bronchodilators, and IV steroids. She was encouraged on smoking cessation. She was discharged on oxygen therapy with activity. At 2 L. Since discharge. Patient reports that she is feeling much improved with decreased shortness of breath. She denies any cough, fever, or hemoptysis, orthopnea, PND, or increased leg swelling. She has not smoked since January 13. She was congratulated on smoking cessation. Chest x-ray today does show improved aeration.   ROS    Constitutional:   No  weight loss, night sweats,  Fevers, chills, + fatigue, or   lassitude.  HEENT:   No headaches,  Difficulty swallowing,  Tooth/dental problems, or  Sore throat,                No sneezing, itching, ear ache, nasal congestion, post nasal drip,   CV:  No chest pain,  Orthopnea, PND, swelling in lower extremities, anasarca, dizziness, palpitations, syncope.   GI  No heartburn, indigestion, abdominal pain, nausea, vomiting, diarrhea, change in bowel habits, loss of appetite, bloody stools.   Resp:    No coughing up of blood.  No change in color of mucus.  No wheezing.  No chest wall deformity  Skin: no rash or lesions.  GU: no dysuria, change in color of urine, no urgency or frequency.  No flank pain, no hematuria   MS:  No joint pain or swelling.  No decreased range of motion.  No back pain.  Psych:  No change in mood or affect. No depression or anxiety.  No memory loss.               Objective:   Physical Exam  amb obese wf nad 08/06/2012  185 >177 09/10/2012  HEENT mild turbinate edema.  Oropharynx no thrush or excess pnd or cobblestoning.  No JVD or cervical adenopathy. Mild accessory muscle hypertrophy. Trachea midline, nl thryroid. Chest was slt hyperinflated by percussion with diminished breath sounds and min  increased exp time without wheeze. Hoover sign positive at end inspiration. Regular rate and rhythm without murmur gallop or rub or increase P2 or edema.  Abd: no hsm, nl excursion.  Ext warm without cyanosis or clubbing.     CXR : no infiltrate or edema, mild interstitial prominence , improved aeration .        Assessment & Plan:

## 2012-09-10 NOTE — Assessment & Plan Note (Signed)
Recent exacerbation now resolved  Plan  Cont on current regimen Congratulated on smoking cessation

## 2012-09-10 NOTE — Assessment & Plan Note (Addendum)
Post op complication w/ hypoxia - CT chest w/ bilateral aspdz felt secondary to intraoperative aspiration versus bacterial pneumonia with compounding edema.  Improved with abx, steroids and diuresis.   Plan   Continue on current regimen.  Wear O2 with activity and As needed   Follow up Dr. Sherene Sires  In 2 weeks and As needed   Please contact office for sooner follow up if symptoms do not improve or worsen or seek emergency care

## 2012-09-14 ENCOUNTER — Encounter: Payer: Self-pay | Admitting: Family Medicine

## 2012-09-14 ENCOUNTER — Ambulatory Visit (INDEPENDENT_AMBULATORY_CARE_PROVIDER_SITE_OTHER): Payer: Medicaid Other | Admitting: Family Medicine

## 2012-09-14 VITALS — BP 121/66 | HR 84 | Temp 97.9°F | Ht 61.0 in | Wt 174.6 lb

## 2012-09-14 DIAGNOSIS — E785 Hyperlipidemia, unspecified: Secondary | ICD-10-CM

## 2012-09-14 DIAGNOSIS — E119 Type 2 diabetes mellitus without complications: Secondary | ICD-10-CM

## 2012-09-14 DIAGNOSIS — R300 Dysuria: Secondary | ICD-10-CM

## 2012-09-14 DIAGNOSIS — D62 Acute posthemorrhagic anemia: Secondary | ICD-10-CM

## 2012-09-14 DIAGNOSIS — K5909 Other constipation: Secondary | ICD-10-CM | POA: Insufficient documentation

## 2012-09-14 DIAGNOSIS — K5900 Constipation, unspecified: Secondary | ICD-10-CM

## 2012-09-14 DIAGNOSIS — G43019 Migraine without aura, intractable, without status migrainosus: Secondary | ICD-10-CM

## 2012-09-14 LAB — POCT URINALYSIS DIPSTICK
Glucose, UA: NEGATIVE
Ketones, UA: NEGATIVE
Leukocytes, UA: NEGATIVE
Protein, UA: 30
Spec Grav, UA: >=1.03

## 2012-09-14 LAB — POCT HEMOGLOBIN: Hemoglobin: 10 g/dL — AB (ref 12.2–16.2)

## 2012-09-14 LAB — LDL CHOLESTEROL, DIRECT: Direct LDL: 77 mg/dL

## 2012-09-14 LAB — POCT UA - MICROSCOPIC ONLY

## 2012-09-14 MED ORDER — CIPROFLOXACIN HCL 250 MG PO TABS: 250.0000 mg | ORAL_TABLET | Freq: Two times a day (BID) | ORAL | Status: DC

## 2012-09-14 MED ORDER — SENNOSIDES 15 MG PO TABS: 15.0000 mg | ORAL_TABLET | Freq: Every day | ORAL | Status: DC

## 2012-09-14 NOTE — Patient Instructions (Addendum)
It was nice to meet you today! Congratulations on quitting smoking!  For your urine - we are running a test today to see if you have a urinary tract infection. I will let you know if you need an antibiotic.  For your blood counts - we are checking a blood test today. I will call you if your test results are not normal.  Otherwise, I will send you a letter.  If you do not hear from me with in 2 weeks please call our office.    We're also checking your cholesterol today.  For your constipation - I am sending in a new medicine for you (senna). You can take it once per day to see if it helps.  For headache - please schedule an appointment to see your neurologist to discuss headache. I'd like for you to be seen in the next week. If you have any fevers, weakness in your arms or legs, trouble talking, problems with your vision or worsening of your headache, call the clinic or go to the emergency room.  Schedule an appointment to see me back in 2-3 weeks so we can see how you are doing.  Be well, Dr. Pollie Meyer

## 2012-09-14 NOTE — Assessment & Plan Note (Signed)
Pt had anemia while hospitalized, and requests that her Hgb be checked today. Most recent hemoglobin was 8.9 on 1/27. Will check fingerstick Hgb today. Pt is taking iron therapy BID.

## 2012-09-14 NOTE — Assessment & Plan Note (Addendum)
C/o dysuria, hesitancy, and frequency. Checked UA and urine micro during visit. Called patient at home after visit, after results of UA and urine micro available. Urine studies suggestive of UTI. Pt has multiple drug allergies, so I precepted this result with Dr. Perley Jain. Will rx ciprofloxacin x3 days, as patient has multiple drug allergies but does not have a history of allergy to fluoroquinolones. Confirmed this with her on the phone, and advised her to return to clinic if not improved in a few days after starting antibiotic. Will send urine for culture as well, in the event that her symptoms do not improve.

## 2012-09-14 NOTE — Assessment & Plan Note (Signed)
A1c most recently 5.6 several weeks ago. Not on any medications currently. Advised patient that she could discontinue checking her blood sugars at home if she would like - there is no indication to check sugars daily in someone who is not on any diabetes medicines and has excellent a1c.

## 2012-09-14 NOTE — Assessment & Plan Note (Signed)
Currently taking simvastatin 40mg . Has not had lipids checked in a while. Will check direct LDL today and titrate as necessary. Will need to switch to another statin if her LDL is high as she is on the maximum dose of simvastatin currently.

## 2012-09-14 NOTE — Assessment & Plan Note (Addendum)
Patient is followed by neurology for her migraines. Reports current migraine is typical for her. Although toradol has worked for her in the past, I will hold off on doing this today as her creatinine had increased from baseline when last checked while hosptialized. Neuro exam is nonfocal, with the exception of horizontal nystagmus which is seen when checking extraocular movements. Given the otherwise non-focality of her exam, and that she is on a medication known to cause nystagmus (lamictal), I suspect this nystagmus is not new for her.   Advised patient that she call her neurologist and request to be seen within one week, to discuss migraine and to be seen for these eye movements. Advised on reasons that should prompt return to clinic or ER (see AVS).  Precepted with Dr. Clementeen Graham who agrees with this plan.

## 2012-09-14 NOTE — Progress Notes (Signed)
Patient ID: [name], female   DOB: 1956/03/10, 57 y.o.   MRN: [id number]  CC: [name] is a 57 y.o. female here to follow up on her hospitalization. She recently was hospitalized at [facility] for a knee replacement, and developed pneumonia during the admission. Also wants to discuss possible UTI, headache, and constipation. This is my first time meeting [name].  HPI:  Hosp f/u - has followed up with pulmonology since her d/c. Has ortho appt on the 13th. Says her breathing is generally doing very well. Has been going to physical therapy for her knee.  Dysuria: patient thinks she may have a UTI. She has been having some hesitancy and burning. Has also had frequency but feels like she can't go when she tries to. No fevers, and is otherwise feeling well (except headache).  Headache: pt has hx of chronic migraine and frequently comes into clinic to get toradol shot. She has one of her usual migraines currently. No vision changes but does have some photophobia. No numbness, tingling, or weakness anywhere in her body. No back pain. Sees neurology about every 6 months and has an appointment to go back in April.  Constipation: is using miralax BID, also docusate, but has not had a BM in about a week. Has passed a small amount of gas. Is using oxycodone for post-operative pain control from knee replacement.  Chronic disease mangement: -Diabetes: carries a diagnosis of diabetes but has never been on any medications for it. A1c was checked on 08/23/12 and was 5.6. She checks her blood sugar daily at home.  -GERD: Pt reports is well controlled on PPI.  ROS: See HPI. Denies rectal bleeding. denies SOB, chest pain, vision changes, lower extremity edema (other than from surgery). No lightheadedness.  PHYSICAL EXAM: BP 121/66  Pulse 84  Temp 97.9 F (36.6 C) (Oral)  Ht 5\' 1"  (1.549 m)  Wt 174 lb 9.6 oz (79.198 kg)  BMI 32.99 kg/m2 Gen: NAD, pleasant, cooperative Heart: RRR Lungs: NWOB, CTAB with some  diminished air movement throughout Abd: soft, nontender, nondistended Neuro: grip 5/5 bilaterally. Negative pronator drift. EOMI. There is some horizontal nystagmus present when testing EOMI. Face symmetric. Sensation intact to light touch bilaterally on face. Walks with cane with normal gait.   Plan: Will plan for pt to f/u in 2-3 weeks to reassess how she's doing. See problem based charting for more details.

## 2012-09-14 NOTE — Assessment & Plan Note (Signed)
Pt reports no BM in 1 week. Abdomen nontender on exam today. Likely is constipated due to concurrent opioid use post-op. Currently taking docusate BID, miralax BID. Will add senna daily to this regimen.

## 2012-09-17 ENCOUNTER — Telehealth: Payer: Self-pay | Admitting: Family Medicine

## 2012-09-17 MED ORDER — FLUCONAZOLE 200 MG PO TABS: 200.0000 mg | ORAL_TABLET | Freq: Every day | ORAL | Status: AC

## 2012-09-17 NOTE — Telephone Encounter (Signed)
Patient's urine grew 35,000 colonies of yeast. Called her to assess how symptoms are doing. She reports they have not improved, and that she is straining to urinate. She is still able to urinate a little bit at a time but has to strain. Also c/o itching and burning in genital region. She otherwise feels well and denies fever, nausea, or vomiting. Pt was discharged from the hospital recently after an extended stay and received multiple antibiotics, so a yeast cystitis is possible.  I will send in an rx for diflucan 200mg  PO daily x14 days to treat candidal cystitis. This will also treat her presumed vaginitis. She denies a hx of allergy to diflucan (has multiple drug allergies). Instructed her not to take simvastatin while on the diflucan due to drug interaction. She is to schedule a f/u appointment for when she completes the diflucan course so we can reassess how she is doing and recheck a UA.   Advised on reasons to return to clinic sooner (or go to ER) which include inability to urinate at all, inability to take PO, fever, and abdominal pain.   Precepted with Drs. Gwendolyn Grant and Nixon.

## 2012-09-19 NOTE — Discharge Summary (Signed)
Dr. Kalman Shan, M.D., [facility].C.P Pulmonary and Critical Care Medicine Staff Physician [facility] [place] [facility] Pager: [phone], If no answer or between  15:00h - 7:00h: call [phone]  09/19/2012 9:29 PM

## 2012-09-19 NOTE — Progress Notes (Signed)
Dr. Kalman Shan, M.D., [facility].C.P Pulmonary and Critical Care Medicine Staff Physician [facility]  Pulmonary and Critical Care Pager: [phone], If no answer or between  15:00h - 7:00h: call [phone]  09/19/2012 9:23 PM

## 2012-09-24 ENCOUNTER — Ambulatory Visit: Payer: Medicaid Other | Admitting: Internal Medicine

## 2012-10-04 ENCOUNTER — Encounter: Payer: Self-pay | Admitting: Family Medicine

## 2012-10-04 ENCOUNTER — Ambulatory Visit (INDEPENDENT_AMBULATORY_CARE_PROVIDER_SITE_OTHER): Payer: Medicaid Other | Admitting: Family Medicine

## 2012-10-04 ENCOUNTER — Ambulatory Visit (HOSPITAL_COMMUNITY)
Admission: RE | Admit: 2012-10-04 | Discharge: 2012-10-04 | Disposition: A | Discharge status: Home / Self Care | Payer: Medicaid Other | Source: Ambulatory Visit | Attending: Family Medicine | Admitting: Family Medicine

## 2012-10-04 VITALS — BP 125/94 | HR 84 | Ht 61.0 in | Wt 170.0 lb

## 2012-10-04 DIAGNOSIS — R079 Chest pain, unspecified: Secondary | ICD-10-CM | POA: Insufficient documentation

## 2012-10-04 DIAGNOSIS — R3911 Hesitancy of micturition: Secondary | ICD-10-CM

## 2012-10-04 MED ORDER — ASPIRIN EC 81 MG PO TBEC: 81.0000 mg | DELAYED_RELEASE_TABLET | Freq: Every day | ORAL | Status: DC

## 2012-10-04 NOTE — Progress Notes (Signed)
CC: [name] is a 57 y.o. female here to follow up on a yeast UTI, and also to discuss a recommendation by her orthopedic doctor that she be referred to a cardiologist.  HPI:  Urinary symptoms: pt recently completed a course of oral diflucan x2 weeks for a symptomatic yeast UTI. Her urine cx had grown 30,000 colonies of yeast and she had continued symptoms so we chose to do this 2 week course. She reports that her symptoms are better, but that she continues to have some trouble with urination. She used to have trouble with what seems like stress incontintence (says she used to urinate on herself when she coughed) but she has not had this problem lately - her problem now is urinary hesitancy. She has to sit on the toilet for about 20 minutes before she is able to urinate. No burning or blood in the urine that she's noticed. She is still drinking okay.  Request for cardiology referral: Pt reports she was at her orthopedic doctor's office the other day and when she was checking in her heart rate was reportedly 188. They obtained an EKG (which she has brought here with her today) and told her that she should see her PCP for referral to a cardiologist. That EKG is of poor quality but shows sinus rhythm, heart rate 87, and some PVCs. Upon review of cardiac systems with pt, she endorses having exertional chest pain over the past two months. She though it was due to acid reflux but now is worried that it is her heart. The pain is in the center of her chest and feels like  "thumping" feeling. It is made worse by exertion (walking) and any kind of emotional upset or excitement. It happens about every other day. She gets cold sweats with it. She also endorses feeling her heart racing "just sometimes". She says she is in just a little bit of pain in her chest right now. The pain is worse if she pushes on her chest.   ROS: See HPI. Vomited yesterday after having a coughing fit. No fevers. No lower extremity swelling.  Also c/o dizziness/shakes for a few days, R ear pain, LLQ pain x a few days, chronic headache for which she requests percocet (explained that no we would not be able to do that today). + cough. Pt asks if taking iron pills can cause dark stools (I explain that yes it can).  PHYSICAL EXAM: BP 125/94  Pulse 84  Ht 5\' 1"  (1.549 m)  Wt 170 lb (77.111 kg)  BMI 32.14 kg/m2 Gen: NAD, pleasant, cooperative HEENT: TM's clear bilaterally, no erythema Heart: RRR, no murmurs noted Lungs: CTAB. Normal respiratory effort. No crackles or wheezes auscultated. Chest: chest wall is tender to palpation and reproduces the pain pt reports she's had for the last few days Abd: soft. Mildly tender to palpation over left lower quadrant. Neuro: nonfocal, speech intact Ext: no appreciable lower extremity edema.

## 2012-10-04 NOTE — Patient Instructions (Addendum)
It was great to see you again today!  For your chest pain, I am referring you to cardiology. You will get a phone call about scheduling this visit.  For your urinary problems, I am referring you to urology. You will also get a phone call about scheduling this visit.  Take an aspirin (81mg ) every day. If you have any new chest pain that is accompanied by shortness of breath, sweating, nausea, or does not go away please go to the ER immediately to be evaluated. Likewise, if you go an entire day without being able to urinate, go to the ER to be seen and evaluated.  You can call the clinic with any questions or concerns. Schedule a follow up visit to follow up on your other concerns within the next 1-2 weeks or sooner if needed.  Be well, Dr. Pollie Meyer

## 2012-10-04 NOTE — Assessment & Plan Note (Addendum)
Pt c/o of urinary hesitancy (has to sit for 20 mins on toilet before she can urinate) which has gone on since she was hospitalized. We treated her adequately for a yeast UTI with some improvement but still has this hesitancy. Will refer to urology for evaluation - may benefit from urodynamic studies. Instructed pt that in the meantime, if she goes an entire day without being to able to urinate then she should go to the ER immediately to be evaluated for true urinary retention. Precepted with Dr. Mauricio Po, who also examined patient and developed this plan along with me.

## 2012-10-04 NOTE — Assessment & Plan Note (Signed)
Pt reports some chest pain which she has had for two months intermittently. Concerning symptoms include that it is worse with exertion and associated with cold sweats. She reports a recent heart rate of 188 at an orthopedic appointment, however the EKG she brought from that same appointment shows a HR of 87.  At today's visit her vitals are normal. We very specifically obtained the history that the chest pain she has had over the past three days is the same pain elicited by palpation of her chest wall. EKG in office shows normal sinus rhythm without concerning signs of ischemia. There is no indication at this point to have pt go to ER or admit to the hospital given the reproducible nature of her current chest pain, normal vitals, and unremarkable EKG.   Will proceed with referral to cardiology as an outpatient. Have reviewed reasons to go to the ER with pt (per AVS), of which she states understanding. She reports she has been taking a baby aspirin every day, but I have added this to her medication list (was not on there previously) and reiterated its importance to her.  Precepted with Dr. Mauricio Po, who also examined patient, reviewed both EKGs, and developed this plan along with me.

## 2012-10-05 ENCOUNTER — Telehealth: Payer: Self-pay | Admitting: *Deleted

## 2012-10-05 NOTE — Telephone Encounter (Signed)
Called and informed patient of appointment with [facility] on 10/08/12 at 3 pm. Also that I faxed the referral to [facility] and they will contact her with that appointment.Busick, [name]

## 2012-10-06 ENCOUNTER — Ambulatory Visit: Payer: Medicaid Other | Admitting: Internal Medicine

## 2012-10-08 ENCOUNTER — Institutional Professional Consult (permissible substitution): Payer: Medicaid Other | Admitting: Cardiovascular Disease

## 2012-10-22 ENCOUNTER — Encounter: Payer: Self-pay | Admitting: Family Medicine

## 2012-10-22 ENCOUNTER — Ambulatory Visit (INDEPENDENT_AMBULATORY_CARE_PROVIDER_SITE_OTHER): Payer: Medicaid Other | Admitting: Family Medicine

## 2012-10-22 VITALS — BP 135/71 | HR 100 | Temp 98.2°F | Ht 61.0 in | Wt 168.0 lb

## 2012-10-22 DIAGNOSIS — J069 Acute upper respiratory infection, unspecified: Secondary | ICD-10-CM

## 2012-10-22 DIAGNOSIS — J309 Allergic rhinitis, unspecified: Secondary | ICD-10-CM

## 2012-10-22 MED ORDER — LORATADINE 10 MG PO CAPS: 10.0000 mg | ORAL_CAPSULE | Freq: Every day | ORAL | Status: DC

## 2012-10-22 MED ORDER — ALBUTEROL SULFATE HFA 108 (90 BASE) MCG/ACT IN AERS: 2.0000 | INHALATION_SPRAY | Freq: Four times a day (QID) | RESPIRATORY_TRACT | Status: DC | PRN

## 2012-10-22 NOTE — Assessment & Plan Note (Signed)
Likely viral versus allergic rhinosinusitis. No sign of pulmonary involvement: bronchitis or pneumonia at this time. Given the early onset, we'll continue conservative management. Recommend push fluids, rest, resumed antihistamine and continue Flonase. avoid cigarette smoke. Monitor for signs of worsening or failure to improve. For acute shortness of breath or chest pain seek emergency care, otherwise follow up in clinic next week if symptoms persist.  I refilled her albuterol inhaler for chronic use, no sign of bronchospasm today.

## 2012-10-22 NOTE — Patient Instructions (Addendum)
You seem to have a virus or allergies triggering cough. Start claritin. Drink plenty of water.  If you get fever, chills, shortness of breath or If not better in next 5 days, then call for appointment. Do your best to avoid smoke, tell her son it's doctors orders! Great job on quitting yourself, that's the best thing you can do for your health.  Headache and Allergies The relationship between allergies and headaches is unclear. Many people with allergic or infectious nasal problems also have headaches (migraines or sinus headaches). However, sometimes allergies can cause pressure that feels like a headache, and sometimes headaches can cause allergy-like symptoms. It is not always clear whether your symptoms are caused by allergies or by a headache. CAUSES   Migraine: The cause of a migraine is not always known.  Sinus Headache: The cause of a sinus headache may be a sinus infection. Other conditions that may be related to sinus headaches include:  Hay fever (allergic rhinitis).  Deviation of the nasal septum.  Swelling or clogging of the nasal passages. SYMPTOMS  Migraine headache symptoms (which often last 4 to 72 hours) include:  Intense, throbbing pain on one or both sides of the head.  Nausea.  Vomiting.  Being extra sensitive to light.  Being extra sensitive to sound.  Nervous system reactions that appear similar to an allergic reaction:  Stuffy nose.  Runny nose.  Tearing. Sinus headaches are felt as facial pain or pressure.  DIAGNOSIS  Because there is some overlap in symptoms, sinus and migraine headaches are often misdiagnosed. For example, a person with migraines may also feel facial pressure. Likewise, many people with hay fever may get migraine headaches rather than sinus headaches. These migraines can be triggered by the histamine release during an allergic reaction. An antihistamine medicine can eliminate this pain. There are standard criteria that help  clarify the difference between these headaches and related allergy or allergy-like symptoms. Your caregiver can use these criteria to determine the proper diagnosis and provide you the best care. TREATMENT  Migraine medicine may help people who have persistent migraine headaches even though their hay fever is controlled. For some people, anti-inflammatory treatments do not work to relieve migraines. Medicines called triptans (such as sumatriptan) can be helpful for those people. Document Released: 10/18/2003 Document Revised: 10/20/2011 Document Reviewed: 11/09/2009 [facility] Patient Information 2013 [place], [place].

## 2012-10-22 NOTE — Progress Notes (Signed)
  Subjective:    Patient ID: [name], female    DOB: 1956-05-26, 57 y.o.   MRN: [id number]  URI     1. Cough/congestion. Nasal congestion and cough productive of green sputum for 2 days. Symptoms stable. She also is having one of her typical migraines currently, which is not different from her usual symptoms. Takes flonase daily and tylenol. Takes topamax for chronic migraine. She denies significant dyspnea, wheezing, chest pain, fatigue, fever, chills, facial pain. She has a history of allergic rhinitis, early emphysema and postoperative pneumonia following a knee replacement in January. She consequently quit smoking in January. Unfortunately her son continues to smoke in the home around her.  Review of Systems See HPI otherwise negative.  reports that she has quit smoking. Her smoking use included Cigarettes. She started smoking about 37 years ago. She has a 36 pack-year smoking history. She quit smokeless tobacco use about 1 months ago.     Objective:   Physical Exam  Vitals reviewed. Constitutional: She is oriented to person, place, and time. She appears well-developed and well-nourished. No distress.  HENT:  Head: Normocephalic and atraumatic.  Right Ear: External ear normal.  Left Ear: External ear normal.  Nose: Nose normal.  Mouth/Throat: Oropharynx is clear and moist.  Nasal congestion and clear rhinorrhea. Slight hoarseness. No pharyngeal exudate. No sinus TTP.  Eyes: EOM are normal. Pupils are equal, round, and reactive to light.  Neck: Normal range of motion. Neck supple.  Cardiovascular: Normal rate, regular rhythm and normal heart sounds.   No murmur heard. Pulmonary/Chest: Effort normal and breath sounds normal. No respiratory distress. She has no wheezes. She has no rales.  Lymphadenopathy:    She has no cervical adenopathy.  Neurological: She is alert and oriented to person, place, and time.  Skin: She is not diaphoretic.          Assessment & Plan:

## 2012-10-22 NOTE — Assessment & Plan Note (Signed)
Refilled loratidine which may be contributing to current symptoms and congestion. Continue flonase. Encouraged her to avoid cigarette smoke which would worsen. Sadly her son smokes in their home. F/u prn if not improving.

## 2012-10-22 NOTE — Assessment & Plan Note (Signed)
Low dose toradol injection for her acute migraine today. Advised to avoid OTC NSAIDs with CKD. F/u prn or if worsening.

## 2012-10-25 ENCOUNTER — Ambulatory Visit: Payer: Medicaid Other | Admitting: Internal Medicine

## 2012-10-29 ENCOUNTER — Telehealth: Payer: Self-pay | Admitting: Family Medicine

## 2012-10-29 ENCOUNTER — Other Ambulatory Visit: Payer: Self-pay | Admitting: *Deleted

## 2012-10-29 MED ORDER — FLUTICASONE PROPIONATE 50 MCG/ACT NA SUSP: 2.0000 | Freq: Every day | NASAL | Status: DC | PRN

## 2012-10-29 MED ORDER — OMEPRAZOLE 40 MG PO CPDR: 40.0000 mg | DELAYED_RELEASE_CAPSULE | Freq: Two times a day (BID) | ORAL | Status: DC

## 2012-10-29 MED ORDER — TIOTROPIUM BROMIDE MONOHYDRATE 18 MCG IN CAPS: 18.0000 ug | ORAL_CAPSULE | Freq: Every day | RESPIRATORY_TRACT | Status: DC

## 2012-10-29 NOTE — Telephone Encounter (Signed)
Called pt and informed. Pt agreed. .[name]  

## 2012-10-29 NOTE — Telephone Encounter (Signed)
To [facility] red team - please call pt and let her know that I got a refill request for two medicines that are not currently on her medicine list (flexeril and mobic). If she needs to continue these she should make an appointment to be seen; for now I will refuse the refill. Thanks!

## 2012-11-01 ENCOUNTER — Ambulatory Visit (INDEPENDENT_AMBULATORY_CARE_PROVIDER_SITE_OTHER): Payer: Medicaid Other | Admitting: Internal Medicine

## 2012-11-01 ENCOUNTER — Encounter: Payer: Self-pay | Admitting: Internal Medicine

## 2012-11-01 VITALS — BP 118/78 | HR 91 | Temp 97.9°F | Ht 61.0 in | Wt 171.8 lb

## 2012-11-01 DIAGNOSIS — J4489 Other specified chronic obstructive pulmonary disease: Secondary | ICD-10-CM

## 2012-11-01 DIAGNOSIS — J449 Chronic obstructive pulmonary disease, unspecified: Secondary | ICD-10-CM

## 2012-11-01 DIAGNOSIS — J9610 Chronic respiratory failure, unspecified whether with hypoxia or hypercapnia: Secondary | ICD-10-CM | POA: Insufficient documentation

## 2012-11-01 DIAGNOSIS — J9611 Chronic respiratory failure with hypoxia: Secondary | ICD-10-CM | POA: Insufficient documentation

## 2012-11-01 NOTE — Progress Notes (Signed)
Subjective:    Patient ID: [name], female    DOB: 06-Jun-1956 MRN: [id number]  HPI  73 yowf active smoker with dx of copd aware of sob around 2000 [facility] with exacerbation every few months just [facility]arged from [facility] in 04/2012 for ? CAP with aecopd referred by Dr Wyline Mood for preop for R TKR Jan 13 14   06/18/2012 Pulmonary preop cc doe x one aisle and since last flare using inhaler saba maybe twice weekly and sleeping ok on cpap maintained on spiriva and can't take advair due to mouth irritation.  Min am cough/ congestion with thick white mucus.  rec Try to minimize smoking Try tudorza one puff twice daily instead of spiriva on trial basis for 2 weeks - call if you like it better and we'll send you a prescription     08/06/2012 f/u ov/Wert still smoking cc not limitedby breathing but by knees,  thinks tudorza works better but only using once a day. >>no changes   09/10/2012 Post Hospital follow up  Patient presents for a post hospital followup. She was admitted January 13-27, for elective right total knee replacement. She had postop complications with hypoxia. CT chest revealed diffuse bilateral patchy airspace disease. It was felt. Patient had possible intraoperative aspiration versus bacterial pneumonia with compounding edema. Autoimmune workup was essentially negative. She was treated with aggressive antibiotics, IV diuresis, nebulized bronchodilators, and IV steroids. She was encouraged on smoking cessation. She was discharged on oxygen therapy with activity. At 2 L. Since discharge. Patient reports that she is feeling much improved with decreased shortness of breath. She denies any cough, fever, or hemoptysis, orthopnea, PND, or increased leg swelling. She has not smoked since January 13. She was congratulated on smoking cessation. Chest x-ray today does show improved aeration. rec Keep up good work with not smoking  Continue on current regimen.  Wear O2 with activity and  As needed    11/01/12 Wert f/u ov cc breathing much better, not using 02 at all, not sure she needs any maint rx. Not limited from any activity due to sob.  No obvious daytime variabilty or assoc chronic cough or cp or chest tightness, subjective wheeze overt sinus or hb symptoms. No unusual exp hx or h/o childhood pna/ asthma or premature birth to her knowledge.   Sleeping ok without nocturnal  or early am exacerbation  of respiratory  c/o's or need for noct saba. Also denies any obvious fluctuation of symptoms with weather or environmental changes or other aggravating or alleviating factors except as outlined above   ROS  The following are not active complaints unless bolded sore throat, dysphagia, dental problems, itching, sneezing,  nasal congestion or excess/ purulent secretions, ear ache,   fever, chills, sweats, unintended wt loss, pleuritic or exertional cp, hemoptysis,  orthopnea pnd or leg swelling, presyncope, palpitations, heartburn, abdominal pain, anorexia, nausea, vomiting, diarrhea  or change in bowel or urinary habits, change in stools or urine, dysuria,hematuria,  rash, arthralgias, visual complaints, headache, numbness weakness or ataxia or problems with walking or coordination,  change in mood/affect or memory.                         Objective:   Physical Exam  amb obese wf nad 08/06/2012  185 >177 09/10/2012 > 11/01/2012  171  HEENT mild turbinate edema.  Oropharynx no thrush or excess pnd or cobblestoning.  No JVD or cervical adenopathy. Mild accessory muscle hypertrophy. Trachea  midline, nl thryroid. Chest was slt hyperinflated by percussion with diminished breath sounds and min  increased exp time without wheeze. Hoover sign positive at end inspiration. Regular rate and rhythm without murmur gallop or rub or increase P2 or edema.  Abd: no hsm, nl excursion. Ext warm without cyanosis or clubbing.     CXR  09/10/12  No segmental infiltrate or pulmonary edema. Mild  interstitial  prominence bilaterally with slight improvement in aeration. Central  mild bronchitic changes         Assessment & Plan:

## 2012-11-01 NOTE — Patient Instructions (Addendum)
Effective today we will stop your oxygen  Ok to leave spiriva (plan A but you don't need since you stopped smoking)  Only use your albuterol (Plan B = puffer/proaire and PLan C is Nebulizer) as a rescue medication to be used if you can't catch your breath by resting or doing a relaxed purse lip breathing pattern. The less you use it, the better it will work when you need it.  Plan D= Call me, the Doctor.  The goal is to use the albuterol no more twice weekly    If you are satisfied with your treatment plan let your doctor know and he/she can either refill your medications or you can return here when your prescription runs out.     If in any way you are not 100% satisfied,  please tell us.  If 100% better, tell your friends!

## 2012-11-03 NOTE — Assessment & Plan Note (Signed)
-   stopped 02 effective 11/01/2012

## 2012-11-03 NOTE — Assessment & Plan Note (Signed)
-   PFT's 08/06/2012 FEV1  1.60 (74%) ratio 70  And no better p B2,  DLCO 48% corrects to 110  I reviewed the Flethcher curve with patient that basically indicates  if you quit smoking when your best day FEV1 is still well preserved (which hers certainly is)  it is highly unlikely you will progress to severe disease and informed the patient there was no medication on the market that has proven to change the curve or the likelihood of progression.  Therefore stopping smoking and maintaining abstinence is the most important aspect of care, not choice of inhalers or for that matter, doctors.    Therefore ok to stop 02, spiriva and just use hfa saba prn with neb to back this up and call if need for saba increases over present baseline (which is rare use)  Pulmonary f/u is prn

## 2012-11-09 ENCOUNTER — Encounter: Payer: Self-pay | Admitting: Cardiovascular Disease

## 2012-11-09 ENCOUNTER — Ambulatory Visit (INDEPENDENT_AMBULATORY_CARE_PROVIDER_SITE_OTHER): Payer: Medicaid Other | Admitting: Cardiovascular Disease

## 2012-11-09 ENCOUNTER — Institutional Professional Consult (permissible substitution): Payer: Medicaid Other | Admitting: Cardiovascular Disease

## 2012-11-09 VITALS — BP 104/68 | HR 100 | Ht 61.0 in | Wt 168.0 lb

## 2012-11-09 DIAGNOSIS — R079 Chest pain, unspecified: Secondary | ICD-10-CM

## 2012-11-09 DIAGNOSIS — E785 Hyperlipidemia, unspecified: Secondary | ICD-10-CM

## 2012-11-09 DIAGNOSIS — F17200 Nicotine dependence, unspecified, uncomplicated: Secondary | ICD-10-CM

## 2012-11-09 NOTE — Assessment & Plan Note (Signed)
Cholesterol is at goal.  Continue current dose of statin and diet Rx.  No myalgias or side effects.  F/U  LFT's in 6 months. Lab Results  Component Value Date   LDLCALC 126* 02/13/2010

## 2012-11-09 NOTE — Assessment & Plan Note (Signed)
Atypical ECG ok Smoker F/U leixscan myovue  Recent right TKR and dyspnea preclude treadmill  I dont think she has so much reactive airway disease that dobutamine would be necessary

## 2012-11-09 NOTE — Patient Instructions (Signed)
Your physician recommends that you schedule a follow-up appointment in:  AS NEEDED  Your physician recommends that you continue on your current medications as directed. Please refer to the Current Medication list given to you today.  Your physician has requested that you have a lexiscan myoview. For further information please visit [email or web address]. Please follow instruction sheet, as given.  

## 2012-11-09 NOTE — Progress Notes (Signed)
Patient ID: [name], female   DOB: April 23, 1956, 57 y.o.   MRN: [id number] 57 yo old smoker with atypical chest pain.  Last couple of months.  Not always exertional.  Can be sharp and radiate to shoulders.  Sees [facility] for COPD.  Still smoking. Has quit in past Allergic to chantix.  Discussed E-cig.  Family history of stroke.  She is on disability for dyspnea, migraines and seizures.  She does not wheeze often No pleuritic component and no positional component.  Pain lasts minutes and can occur 2-3 x/week.    ROS: Denies fever, malais, weight loss, blurry vision, decreased visual acuity, cough, sputum, SOB, hemoptysis, pleuritic pain, palpitaitons, heartburn, abdominal pain, melena, lower extremity edema, claudication, or rash.  All other systems reviewed and negative  General: Affect appropriate Overweight white female smoker HEENT: normal Neck supple with no adenopathy JVP normal no bruits no thyromegaly Lungs clear with no wheezing and good diaphragmatic motion Heart:  S1/S2 no murmur, no rub, gallop or click PMI normal Abdomen: benighn, BS positve, no tenderness, no AAA no bruit.  No HSM or HJR Distal pulses intact with no bruits No edema Neuro non-focal Skin warm and dry No muscular weakness   Current Outpatient Prescriptions  Medication Sig Dispense Refill  . acetaminophen (TYLENOL) 325 MG tablet Take 2 tablets (650 mg total) by mouth every 6 (six) hours as needed for pain or fever (greater than 101). For pain      . albuterol (PROAIR HFA) 108 (90 BASE) MCG/ACT inhaler Inhale 2 puffs into the lungs every 6 (six) hours as needed. Shortness of breath  1 Inhaler  0  . albuterol (PROVENTIL) (2.5 MG/3ML) 0.083% nebulizer solution Take 2.5 mg by nebulization every 6 (six) hours as needed. For shortness of breath      . Ascorbic Acid (VITAMIN C) 100 MG tablet Take 100 mg by mouth daily.      Marland Kitchen aspirin EC 81 MG tablet Take 1 tablet (81 mg total) by mouth daily.      . Blood Glucose  Monitoring Suppl (BLOOD GLUCOSE METER) kit Use to check blood glucose daily.  Accuchek preffered by patient  1 each  0  . Cholecalciferol (VITAMIN D3) 2000 UNITS TABS Take 1 tablet by mouth daily.      Marland Kitchen dicyclomine (BENTYL) 20 MG tablet Take 1 tablet (20mg ) by mouth four times daily- Before meals and at bedtime  120 tablet  5  . docusate sodium 100 MG CAPS Take 100 mg by mouth 2 (two) times daily.  60 capsule  0  . ferrous sulfate 325 (65 FE) MG tablet Take 1 tablet (325 mg total) by mouth 2 (two) times daily with a meal.  60 tablet  3  . fluticasone (FLONASE) 50 MCG/ACT nasal spray Place 2 sprays into the nose daily as needed. For congestion.  16 g  0  . Glucose Blood (BLOOD GLUCOSE TEST STRIPS) STRP Use to test blood glucose daily.  100 each  12  . lamoTRIgine (LAMICTAL) 150 MG tablet Take 1 tablet (150 mg total) by mouth 2 (two) times daily.  60 tablet  3  . Loratadine 10 MG CAPS Take 1 capsule (10 mg total) by mouth daily.  30 each  5  . nitrofurantoin (MACRODANTIN) 100 MG capsule Take 100 mg by mouth at bedtime.      Marland Kitchen omeprazole (PRILOSEC) 40 MG capsule Take 1 capsule (40 mg total) by mouth 2 (two) times daily.  60 capsule  0  .  QUEtiapine (SEROQUEL) 50 MG tablet Take 50 mg by mouth at bedtime. 1 tab pm for headaches      . Sennosides 15 MG TABS Take 1 tablet (15 mg total) by mouth daily.  30 each  0  . simvastatin (ZOCOR) 40 MG tablet Take 1 tablet (40 mg total) by mouth at bedtime.  30 tablet  5  . tiotropium (SPIRIVA) 18 MCG inhalation capsule Place 1 capsule (18 mcg total) into inhaler and inhale daily.  30 capsule  0  . topiramate (TOPAMAX) 100 MG tablet Take 100 mg by mouth 2 (two) times daily.         No current facility-administered medications for this visit.    Allergies  Amoxicillin; Azithromycin; Doxycycline hyclate; Penicillins; Sulfonamide derivatives; Chantix; Clarithromycin; Fluticasone-salmeterol; and Nortriptyline  Electrocardiogram: SR rate 88 normal ECG     Assessment and Plan

## 2012-11-09 NOTE — Assessment & Plan Note (Signed)
She indicates being on disability for dyspnea and review of old records indicate hospitaliztion last year for pneumonia. But she indicates Dr Sherene Sires says she has no COPD  I told her she does Lung exam with rhonchi and she has inhalers Yearly CXR Little motivation to quit but has E-cig at home

## 2012-11-15 ENCOUNTER — Ambulatory Visit (HOSPITAL_COMMUNITY): Payer: Medicaid Other | Attending: Cardiovascular Disease | Admitting: Radiology

## 2012-11-15 VITALS — BP 130/75 | HR 86 | Ht 61.0 in | Wt 167.0 lb

## 2012-11-15 DIAGNOSIS — R079 Chest pain, unspecified: Secondary | ICD-10-CM

## 2012-11-15 DIAGNOSIS — R0602 Shortness of breath: Secondary | ICD-10-CM | POA: Insufficient documentation

## 2012-11-15 DIAGNOSIS — F17200 Nicotine dependence, unspecified, uncomplicated: Secondary | ICD-10-CM | POA: Insufficient documentation

## 2012-11-15 DIAGNOSIS — R0789 Other chest pain: Secondary | ICD-10-CM | POA: Insufficient documentation

## 2012-11-15 DIAGNOSIS — E119 Type 2 diabetes mellitus without complications: Secondary | ICD-10-CM | POA: Insufficient documentation

## 2012-11-15 DIAGNOSIS — R5381 Other malaise: Secondary | ICD-10-CM | POA: Insufficient documentation

## 2012-11-15 DIAGNOSIS — R002 Palpitations: Secondary | ICD-10-CM | POA: Insufficient documentation

## 2012-11-15 DIAGNOSIS — R42 Dizziness and giddiness: Secondary | ICD-10-CM | POA: Insufficient documentation

## 2012-11-15 DIAGNOSIS — R55 Syncope and collapse: Secondary | ICD-10-CM | POA: Insufficient documentation

## 2012-11-15 MED ORDER — TECHNETIUM TC 99M SESTAMIBI GENERIC - CARDIOLITE
33.0000 | Freq: Once | INTRAVENOUS | Status: AC | PRN
  Administered 2012-11-15: 33 via INTRAVENOUS

## 2012-11-15 MED ORDER — AMINOPHYLLINE 25 MG/ML IV SOLN
75.0000 mg | Freq: Once | INTRAVENOUS | Status: AC
  Administered 2012-11-15: 75 mg via INTRAVENOUS

## 2012-11-15 MED ORDER — TECHNETIUM TC 99M SESTAMIBI GENERIC - CARDIOLITE
11.0000 | Freq: Once | INTRAVENOUS | Status: AC | PRN
  Administered 2012-11-15: 11 via INTRAVENOUS

## 2012-11-15 MED ORDER — REGADENOSON 0.4 MG/5ML IV SOLN
0.4000 mg | Freq: Once | INTRAVENOUS | Status: AC
  Administered 2012-11-15: 0.4 mg via INTRAVENOUS

## 2012-11-15 NOTE — Progress Notes (Signed)
[facility] SITE 3 NUCLEAR MED [address] [place], [place] [address] [phone]    Cardiology Nuclear Med Study  [name] is a 57 y.o. female     MRN : [id number]     DOB: 05-05-1956  Procedure Date: 11/15/2012  Nuclear Med Background Indication for Stress Test:  Evaluation for Ischemia History:  ~10 yrs ago OZH:[id number] per pt; 1/14 Echo:EF=65% Cardiac Risk Factors: Lipids, NIDDM and Smoker  Symptoms:  Chest Pressure/Tightness with and without Exertion (last episode of chest discomfort yesterday), Chronic Dizziness, SOB/DOE, Fatigue, Near Syncope, Palpitations and Rapid HR    Nuclear Pre-Procedure Caffeine/Decaff Intake:  8:00pm NPO After: 5:00am   Lungs:  Mild inspiratory wheezes.  Albuterol inhaler used prior to [employer]. O2 Sat: 98% on room air. IV 0.9% NS with Angio Cath:  22g  IV Site: R Antecubital  IV Started by:  Doyne Keel, CNMT  Chest Size (in):  38 Cup Size: C  Height: 5\' 1"  (1.549 m)  Weight:  167 lb (75.751 kg)  BMI:  Body mass index is 31.57 kg/(m^2). Tech Comments:  No med's held; used inhaler at [employer] Med Study 1 or 2 day study: 1 day  Stress Test Type:  Eugenie Birks  Reading MD: Charlton Haws, MD  Order Authorizing Provider:  P. Eden Emms, MD  Resting Radionuclide: Technetium 61m Sestamibi  Resting Radionuclide Dose: 11.0 mCi   Stress Radionuclide:  Technetium 40m Sestamibi  Stress Radionuclide Dose: 33.0 mCi           Stress Protocol Rest HR: 86 Stress HR: 112  Rest BP: 130/75 Stress BP: 124/73  Exercise Time (min): n/a METS: n/a   Predicted Max HR: 165 bpm % Max HR: 67.88 bpm Rate Pressure Product: 46962   Dose of Adenosine (mg):  n/a Dose of Lexiscan: 0.4 mg  Dose of Atropine (mg): n/a Dose of Dobutamine: n/a mcg/kg/min (at max HR)  Stress Test Technologist: Smiley Houseman, CMA-N  Nuclear Technologist:  Domenic Polite, CNMT     Rest Procedure:  Myocardial perfusion imaging was performed at rest 45 minutes following the  intravenous administration of Technetium 15m Sestamibi.  Rest ECG: NSR - Normal EKG  Stress Procedure:  The patient received IV Lexiscan 0.4 mg over 15-seconds.  Technetium 83m Sestamibi injected at 30-seconds.  Quantitative spect images were obtained after a 45 minute delay.  Stress ECG: No significant change from baseline ECG  QPS Raw Data Images:  Normal; no motion artifact; normal heart/lung ratio. Stress Images:  Normal homogeneous uptake in all areas of the myocardium. Rest Images:  Normal homogeneous uptake in all areas of the myocardium. Subtraction (SDS):  Normal Transient Ischemic Dilatation (Normal <1.22):  0.82 Lung/Heart Ratio (Normal <0.45):  0.33  Quantitative Gated Spect Images QGS EDV:  65 ml QGS ESV:  14 ml  Impression Exercise Capacity:  Lexiscan with no exercise. BP Response:  Normal blood pressure response. Clinical Symptoms:  There is dyspnea. ECG Impression:  No significant ST segment change suggestive of ischemia. Comparison with Prior Nuclear Study: No images to compare  Overall Impression:  Normal stress nuclear study.  LV Ejection Fraction: 78%.  LV Wall Motion:  NL LV Function; NL Wall Motion   Charlton Haws

## 2012-11-24 ENCOUNTER — Ambulatory Visit (INDEPENDENT_AMBULATORY_CARE_PROVIDER_SITE_OTHER): Payer: Medicaid Other | Admitting: Nurse Practitioner

## 2012-11-24 ENCOUNTER — Encounter: Payer: Self-pay | Admitting: Nurse Practitioner

## 2012-11-24 VITALS — BP 113/72 | HR 90 | Ht 63.0 in | Wt 175.0 lb

## 2012-11-24 DIAGNOSIS — G4733 Obstructive sleep apnea (adult) (pediatric): Secondary | ICD-10-CM

## 2012-11-24 DIAGNOSIS — R569 Unspecified convulsions: Secondary | ICD-10-CM

## 2012-11-24 DIAGNOSIS — G43019 Migraine without aura, intractable, without status migrainosus: Secondary | ICD-10-CM

## 2012-11-24 MED ORDER — QUETIAPINE FUMARATE 50 MG PO TABS: 50.0000 mg | ORAL_TABLET | Freq: Every day | ORAL | Status: DC

## 2012-11-24 MED ORDER — TOPIRAMATE 100 MG PO TABS: ORAL_TABLET | ORAL | Status: DC

## 2012-11-24 NOTE — Progress Notes (Signed)
HPI: Patient returns for followup after last visit with Dr. Anne Hahn 07/26/2012. She has a long history of chronic daily headaches. She also has sleep apnea and uses CPAP which is helped somewhat with her early morning headaches. Her headaches are generalized in nature along with some chronic neck discomfort and neck stiffness. Gabapentin has not been helpful in the past. She is currently on Topamax and Seroquel. She continues to smoke a pack a day and was encouraged to quit. She reports that she takes 6-8 Tylenol a day and was made aware that it can cause rebound headaches.  ROS:  - weight loss, chest pain, swelling in legs, ringing in ears, loss of vision, SOB, wheezing, snoring, constipation, easy bruising, feeling cold, increased thrist, joint pain, joint swelling, aching muscles, allergies, headache, weakness, slurred speech, dizziness, snoring, restless leg, not enough sleep, change in appetite  Physical Exam General: well developed, well nourished, seated, in no evident distress Head: head normocephalic and atraumatic. Oropharynx benign Neck: supple with no carotid or supraclavicular bruits Cardiovascular: regular rate and rhythm, no murmurs  Neurologic Exam Mental Status: Awake and fully alert. Oriented to place and time. Recent and remote memory intact. Attention span, concentration and fund of knowledge appropriate. Mood and affect appropriate.  Cranial Nerves: Pupils equal, briskly reactive to light. Extraocular movements full with end gaze nystagmus bilaterally.  Visual fields full to confrontation. Hearing intact and symmetric to finger snap. Facial sensation intact. Face, tongue, palate move normally and symmetrically. Neck flexion and extension normal.  Motor: Normal bulk and tone. Normal strength in all tested extremity muscles. Sensory.: intact to touch and pinprick and vibratory.  Coordination: Rapid alternating movements normal in all extremities. Finger-to-nose and heel-to-shin  performed accurately bilaterally. Gait and Station: Arises from chair without difficulty. Stance is normal.  Able to heel, toe and unsteady with  tandem walk.  Reflexes: 1+ and symmetric. Toes downgoing.     ASSESSMENT: Chronic daily headaches Obstructive sleep apnea History of seizure disorder     PLAN: Continue topiramate for headaches, prescription to be called in Continue Seroquel, prescription to be called in Stopped smoking, you can obtain information from the [employer] Regular CPAP down load  Given information to read on rebound headache, stop OTC products Followup in 6 months   Nilda Riggs, GNP-BC APRN

## 2012-11-24 NOTE — Progress Notes (Signed)
I have read the note, and I agree with the clinical assessment and plan.  

## 2012-11-24 NOTE — Patient Instructions (Addendum)
Continue topiramate for headaches, prescription to be called in Continue Seroquel, prescription to be called in Stopped smoking, you can obtain information from the American lung Association Regular CPAP down load  Given information to read on rebound headache Followup in 6 months

## 2012-11-30 ENCOUNTER — Other Ambulatory Visit: Payer: Self-pay | Admitting: *Deleted

## 2012-12-01 ENCOUNTER — Ambulatory Visit (INDEPENDENT_AMBULATORY_CARE_PROVIDER_SITE_OTHER): Payer: Medicaid Other | Admitting: Family Medicine

## 2012-12-01 VITALS — BP 111/71 | HR 96 | Ht 61.0 in | Wt 167.0 lb

## 2012-12-01 DIAGNOSIS — G43019 Migraine without aura, intractable, without status migrainosus: Secondary | ICD-10-CM

## 2012-12-01 MED ORDER — LORATADINE 10 MG PO CAPS: 10.0000 mg | ORAL_CAPSULE | Freq: Every day | ORAL | Status: DC

## 2012-12-01 MED ORDER — SIMVASTATIN 40 MG PO TABS: 40.0000 mg | ORAL_TABLET | Freq: Every day | ORAL | Status: DC

## 2012-12-01 MED ORDER — KETOROLAC TROMETHAMINE 30 MG/ML IJ SOLN: 30.0000 mg | Freq: Once | INTRAMUSCULAR | Status: DC

## 2012-12-01 MED ORDER — LAMOTRIGINE 150 MG PO TABS: ORAL_TABLET | ORAL | Status: DC

## 2012-12-01 MED ORDER — OMEPRAZOLE 40 MG PO CPDR: 40.0000 mg | DELAYED_RELEASE_CAPSULE | Freq: Two times a day (BID) | ORAL | Status: DC

## 2012-12-01 MED ORDER — KETOROLAC TROMETHAMINE 30 MG/ML IJ SOLN
15.0000 mg | Freq: Once | INTRAMUSCULAR | Status: AC
  Administered 2012-12-01: 15 mg via INTRAMUSCULAR

## 2012-12-01 MED ORDER — FLUTICASONE PROPIONATE 50 MCG/ACT NA SUSP: 2.0000 | Freq: Every day | NASAL | Status: DC | PRN

## 2012-12-01 MED ORDER — PROMETHAZINE HCL 25 MG/ML IJ SOLN
12.5000 mg | Freq: Once | INTRAMUSCULAR | Status: AC
  Administered 2012-12-01: 12.5 mg via INTRAMUSCULAR

## 2012-12-01 MED ORDER — ALBUTEROL SULFATE HFA 108 (90 BASE) MCG/ACT IN AERS: 2.0000 | INHALATION_SPRAY | Freq: Four times a day (QID) | RESPIRATORY_TRACT | Status: DC | PRN

## 2012-12-01 MED ORDER — PROMETHAZINE HCL 25 MG/ML IJ SOLN: 25.0000 mg | Freq: Once | INTRAMUSCULAR | Status: DC

## 2012-12-01 NOTE — Assessment & Plan Note (Signed)
IM Toradol low dose given renal is sufficiencey and phenergan.  Advised to discontinue tylenol use.

## 2012-12-01 NOTE — Patient Instructions (Signed)
Headaches, Analgesic Rebound Analgesic agents are prescription or over-the-counter medications used to control pain, including headaches. However, overuse or misuse of theses medications can lead to rebound headaches. Rebound headaches are headaches that recur after the analgesic medication wears off. Eventually, the rebound headaches can become longlasting (chronic). If this happens, you must completely stop using analgesic medications. If not, the chronic headache is likely to continue despite the use of any other treatment. Usually when you stop taking analgesic medications, the headache may initally get worse for several days. Along with this you may experience sickness in your stomach (nausea), and you may throw up (vomit). After a period of 3 to 5 days, these symptoms begin to improve. Sometimes improvement may take longer. Eventually, the headaches will slowly improve with treatment with the right medications. Most people are able to stop using analgesic medications at home with a caregiver's supervision. But some find it difficult and may require hospitalization. Document Released: 10/18/2003 Document Revised: 10/20/2011 Document Reviewed: 03/16/2008 [facility] Patient Information 2013 [place], [place].

## 2012-12-01 NOTE — Progress Notes (Signed)
  Subjective:    Patient ID: [name], female    DOB: [birth year], 57 y.o.   MRN: [id number]  HPI  [name] comes in with a migraine that has been going on for one week.  She says it is the same as her typical migraines.  She has frontal headache with photophobia, but she does not have nausea or aura.  She says she has been taking tylenol and compliant with her Topamax.  She recently saw her neurologist who discussed rebound headaches from the tylenol, but she says this headache has been so bad she had to keep taking the tylenol.  Review of Systems Negative except in HPI    Objective:   Physical Exam BP 111/71  Pulse 96  Ht 5\' 1"  (1.549 m)  Wt 167 lb (75.751 kg)  BMI 31.57 kg/m2 General appearance: alert, cooperative and no distress Eyes: PERRL, EOMIT, Fundoscopic Exam WNL CN: II-XII in tact      Assessment & Plan:

## 2012-12-01 NOTE — Addendum Note (Signed)
Addended by: Jone Baseman D on: 12/01/2012 04:54 PM   Modules accepted: Orders

## 2012-12-01 NOTE — Addendum Note (Signed)
Addended by: Damita Lack on: 12/01/2012 08:32 AM   Modules accepted: Orders

## 2012-12-03 ENCOUNTER — Other Ambulatory Visit: Payer: Self-pay | Admitting: *Deleted

## 2012-12-03 MED ORDER — TIOTROPIUM BROMIDE MONOHYDRATE 18 MCG IN CAPS: 18.0000 ug | ORAL_CAPSULE | Freq: Every day | RESPIRATORY_TRACT | Status: DC

## 2012-12-09 ENCOUNTER — Other Ambulatory Visit: Payer: Self-pay | Admitting: Family Medicine

## 2012-12-09 MED ORDER — VITAMIN D3 50 MCG (2000 UT) PO TABS: 1.0000 | ORAL_TABLET | Freq: Every day | ORAL | Status: DC

## 2012-12-23 ENCOUNTER — Encounter: Payer: Self-pay | Admitting: Family Medicine

## 2012-12-23 ENCOUNTER — Ambulatory Visit (INDEPENDENT_AMBULATORY_CARE_PROVIDER_SITE_OTHER): Payer: Medicaid Other | Admitting: Family Medicine

## 2012-12-23 VITALS — BP 114/76 | HR 89 | Temp 98.1°F | Ht 61.0 in | Wt 174.0 lb

## 2012-12-23 DIAGNOSIS — M542 Cervicalgia: Secondary | ICD-10-CM

## 2012-12-23 DIAGNOSIS — R05 Cough: Secondary | ICD-10-CM

## 2012-12-23 DIAGNOSIS — M5090 Cervical disc disorder, unspecified, unspecified cervical region: Secondary | ICD-10-CM

## 2012-12-23 MED ORDER — HYDROCODONE-HOMATROPINE 5-1.5 MG/5ML PO SYRP: 5.0000 mL | ORAL_SOLUTION | Freq: Three times a day (TID) | ORAL | Status: DC | PRN

## 2012-12-23 MED ORDER — MELOXICAM 15 MG PO TABS: 15.0000 mg | ORAL_TABLET | Freq: Every day | ORAL | Status: DC

## 2012-12-23 NOTE — Patient Instructions (Addendum)
Dear Ms. Lymon,   Neck Pain - Start taking meloxicam for the neck pain and take it with meals.   Cough - This is likely due to a viral illness. Please use the cough syrup that I sent to the pharmacy.   Follow up with Dr. Pollie Meyer in 1 week.   Sincerely,   Dr. Clinton Sawyer

## 2012-12-23 NOTE — Progress Notes (Signed)
  Subjective:    Patient ID: [name], female    DOB: 1956-02-01, 57 y.o.   MRN: [id number]  HPI  57 year old F with complex medical history presenting with cough and neck pain.   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 Thurston Hole; has a history of taking pain medication and muscle relaxers; MRI in 2011 notes degenerative cervical spondylosis with multi-level foraminal stenosis  Cough - Started Sunday, 1 ppd > 30 years, no fevers, no chills, dry cough, has history bronchitis for which she has taken antibiotics in the past; has COPD, takes albuterol 3-4x a day, takes spiriva daily; Patient not interested in quitting b/c it is her coping mechanism for stress - biggest stressor is her family and dealing with an adult son with bi-polar disease    Review of Systems See HPI     Objective:   Physical Exam BP 114/76  Pulse 89  Temp(Src) 98.1 F (36.7 C)  Ht 5\' 1"  (1.549 m)  Wt 174 lb (78.926 kg)  BMI 32.89 kg/m2 Gen: appears older than stated age, chronically ill appearing, smells of cigarette smoke HEENT: NCAT, OP clear and moist, no pharyngeal erythema or exudates, no adenopathy Lungs: persistent dry cough, normal work of breathing, very mild expiratory wheezes in LLQ, no rales or rhonchi, no dullness to percussion MSK: TTP along cervical paraspinal muscles and left trapezius, positive Spurling's test bilaterally      Assessment & Plan:  Neck pain concerning for cervical spinal nerve root impingement and cough possible viral URI.

## 2012-12-29 DIAGNOSIS — R05 Cough: Secondary | ICD-10-CM | POA: Insufficient documentation

## 2012-12-29 DIAGNOSIS — M542 Cervicalgia: Secondary | ICD-10-CM | POA: Insufficient documentation

## 2012-12-29 NOTE — Assessment & Plan Note (Signed)
Known degenerative cervical spondylosis. Will start conservative therapy with anti-inflammatory meloxicam. F/u in one week.

## 2012-12-31 ENCOUNTER — Other Ambulatory Visit: Payer: Self-pay | Admitting: *Deleted

## 2013-01-01 MED ORDER — ALBUTEROL SULFATE HFA 108 (90 BASE) MCG/ACT IN AERS: 2.0000 | INHALATION_SPRAY | Freq: Four times a day (QID) | RESPIRATORY_TRACT | Status: DC | PRN

## 2013-01-01 MED ORDER — OMEPRAZOLE 40 MG PO CPDR: 40.0000 mg | DELAYED_RELEASE_CAPSULE | Freq: Two times a day (BID) | ORAL | Status: DC

## 2013-01-01 MED ORDER — FLUTICASONE PROPIONATE 50 MCG/ACT NA SUSP: 2.0000 | Freq: Every day | NASAL | Status: DC | PRN

## 2013-01-01 MED ORDER — DICYCLOMINE HCL 20 MG PO TABS: ORAL_TABLET | ORAL | Status: DC

## 2013-01-05 ENCOUNTER — Ambulatory Visit (INDEPENDENT_AMBULATORY_CARE_PROVIDER_SITE_OTHER): Payer: Medicaid Other | Admitting: Family Medicine

## 2013-01-05 ENCOUNTER — Encounter: Payer: Self-pay | Admitting: Family Medicine

## 2013-01-05 VITALS — BP 114/65 | HR 92 | Temp 98.7°F | Ht 61.0 in | Wt 172.0 lb

## 2013-01-05 DIAGNOSIS — M542 Cervicalgia: Secondary | ICD-10-CM

## 2013-01-05 DIAGNOSIS — R05 Cough: Secondary | ICD-10-CM

## 2013-01-05 DIAGNOSIS — E559 Vitamin D deficiency, unspecified: Secondary | ICD-10-CM

## 2013-01-05 DIAGNOSIS — R059 Cough, unspecified: Secondary | ICD-10-CM

## 2013-01-05 DIAGNOSIS — E119 Type 2 diabetes mellitus without complications: Secondary | ICD-10-CM

## 2013-01-05 DIAGNOSIS — E785 Hyperlipidemia, unspecified: Secondary | ICD-10-CM

## 2013-01-05 DIAGNOSIS — Z23 Encounter for immunization: Secondary | ICD-10-CM

## 2013-01-05 LAB — POCT GLYCOSYLATED HEMOGLOBIN (HGB A1C): Hemoglobin A1C: 5.5

## 2013-01-05 MED ORDER — DICLOFENAC SODIUM 50 MG PO TBEC: 50.0000 mg | DELAYED_RELEASE_TABLET | Freq: Three times a day (TID) | ORAL | Status: DC

## 2013-01-05 MED ORDER — BACLOFEN 5 MG HALF TABLET: 5.0000 mg | ORAL_TABLET | Freq: Three times a day (TID) | ORAL | Status: DC

## 2013-01-05 NOTE — Progress Notes (Signed)
Patient ID: [name], female   DOB: 09-21-1955, 57 y.o.   MRN: [id number]  Name: [name] Age/Sex: 57 y.o. female  HPI:  Neck pain: pt reports having a sharp/stabbing like pain in her left neck/shoulder. Recently came to the clinic and saw Dr. Clinton Sawyer, who gave rx for mobic. She reports that it did not help at all. It radiates 1/2 way down her back and makes her head hurt worse than normal (has chronic headaches), also radiates down her arm. Her ear is also hurting. The pain is worse when she sits down, not worse with ambulation. She also ntoes pain in her back x 1 year, which is worse in the morning and makes her feel paralyzed for about 15 minutes when waking up each morning. No other joints are stiff. No fevers or saddle anesthesia. She did not injure her neck. Has taken flexeril before but says it generally didn't help.  Diabetes: Not currently on any diabetes medication. Last went to eye doctor one month ago. A1c today 5.5.  Bentyl use: reviewed patient's medications with her today. She is on a multitude of chronic medications. Explained the benefit of being on fewer medicines, and that it might be good to try and d/c some of her medicines if we can try. She has been taking Bentyl chronically for about 5 years for IBS. She is willing to try to come off of Bentyl.  Bronchitis f/u: was seen with likely viral bronchitis. Thinks her breathing is somewhat better.   ROS: See HPI.  PMFSH: has resumed smoking; had quit for several months but reports she has been stressed and it caused her to start back smoking. She says 7 people live in her house and that is a cause of stress for her.  Chronic Disease coordination with specialists: Chronic UTI - saw urologist, goes back in 1 year. On daily macrobid for UTI prophylaxis. Sx have improved. Pulm - has f/u scheduled in 1 year  PHYSICAL EXAM: BP 114/65  Pulse 92  Temp(Src) 98.7 F (37.1 C) (Oral)  Ht 5\' 1"  (1.549 m)  Wt 172 lb (78.019 kg)   BMI 32.52 kg/m2 Gen: NAD. Pleasant and cooperative. Heart: RRR, no murmurs auscultated Lungs: CTAB, normal respiratory effort Neuro: grossly nonfocal.  Ext: pain over posterior aspect of left trapezoid with palpation and with neck movement. Neck has full range of motion. Full strength in bilateral upper extremities. Grip 5/5 bilaterally. Sensation in tact to light touch bilaterally. Trigger point palpable over posterior neck/shoulder.  ASSESSMENT/PLAN:  # Health maintenance:  -due for mammogram, handout given today on how to schedule -tetanus shot given today  # Polypharmacy: Pt will try to stop bentyl and see if she can tolerate it. Would like to cut back on pt being on so many medications if we can help it. Pt is amenable to this plan. Precepted w/ Dr. Gwendolyn Grant who agrees with this plan.

## 2013-01-05 NOTE — Patient Instructions (Addendum)
It was great to see you again today!  I sent in two new medicines for your neck pain: baclofen and diclofenac. You can take each one 3 times per day. These are not meant to be long term medicines. We are also referring you to physical therapy.  Try stopping the bentyl for a while and see if your symptoms return. It's best to not be on so many medicines if you can help it!  If your breathing isn't better in another week, return to the clinic to be seen. You can call if you have any questions.  You're due for a mammogram. See the handout on how to schedule it. Come back one morning for fasting labs.  Be well, Dr. Pollie Meyer

## 2013-01-06 ENCOUNTER — Other Ambulatory Visit: Payer: Self-pay

## 2013-01-06 DIAGNOSIS — Z1231 Encounter for screening mammogram for malignant neoplasm of breast: Secondary | ICD-10-CM

## 2013-01-07 ENCOUNTER — Other Ambulatory Visit: Payer: Self-pay | Admitting: Family Medicine

## 2013-01-07 MED ORDER — OMEPRAZOLE 40 MG PO CPDR: 40.0000 mg | DELAYED_RELEASE_CAPSULE | Freq: Two times a day (BID) | ORAL | Status: DC

## 2013-01-10 NOTE — Assessment & Plan Note (Signed)
Pt reports breathing not all the way better but slightly improved. Lungs clear on exam today. Will continue to monitor and counsel pt to return if not improved in 1 week.

## 2013-01-10 NOTE — Assessment & Plan Note (Signed)
Appears to be musculoskeletal in etiology given tenderness to palpation. Mobic did not help. Will rx short course of diclofenac and baclofen to help with muscle spasm and inflammation to see if it helps. Will also refer to physical therapy.

## 2013-01-10 NOTE — Assessment & Plan Note (Signed)
A1c 5.5. Not on any medications for diabetes for several years. It would appear that she has successfully been "cured" of diabetes. Will continue to periodically monitor A1c but otherwise does not seem to need any long term interventions at this time.

## 2013-01-10 NOTE — Assessment & Plan Note (Addendum)
Will have patient return for fasting lipids and titrate statin as necessary. Also check CMET.

## 2013-01-13 ENCOUNTER — Ambulatory Visit (INDEPENDENT_AMBULATORY_CARE_PROVIDER_SITE_OTHER): Payer: Medicaid Other | Admitting: Family Medicine

## 2013-01-13 VITALS — BP 105/60 | HR 97 | Wt 171.6 lb

## 2013-01-13 DIAGNOSIS — R05 Cough: Secondary | ICD-10-CM

## 2013-01-13 NOTE — Progress Notes (Signed)
Subjective:     Patient ID: [name], female   DOB: December 06, 1955, 57 y.o.   MRN: [id number]  HPI Bronchitis: Patient is here today because she feels she is "getting bronchitis and sinus infection." She reports she had an appointment a few weeks ago for bronchitis, but was told she would have to let it run its course. She now feels it is getting worse over the last four days and she has a sinus infection. She denies fever, diarrhea, nausea, vomit, constipation, dysuria or rash. She endorses a headache, she calls migraine, that is like her other migraines. She also endorses a cough that occasionally will produce a yellow sputum and nasal drainage that is green chunks. She would like a "shot" like she has had prior for her headache, because her headache affects her walking. She reports she has taking her nebulizer treatment , inhaler, flonase, spiriva and topamax today. She is a smoker and her last cigarette was prior to her appointment. She reports she has cut back, since she has been ill.   Review of Systems Per HPI    Objective:   Physical Exam BP 105/60  Pulse 97  Wt 171 lb 9.6 oz (77.837 kg)  BMI 32.44 kg/m2  SpO2 92% Gen: Obese, cooperative female. NAD. In wheel chair d/t "headache" affecting her walking.  CV: RRR. No murmur Chest: bilateral wheezing and rhonchi throughout. No crackles.  Abd: soft. NTND. BS present and normal

## 2013-01-13 NOTE — Patient Instructions (Addendum)
Migraine Headache A migraine headache is an intense, throbbing pain on one or both sides of your head. A migraine can last for 30 minutes to several hours. CAUSES  The exact cause of a migraine headache is not always known. However, a migraine may be caused when nerves in the brain become irritated and release chemicals that cause inflammation. This causes pain. SYMPTOMS  Pain on one or both sides of your head.  Pulsating or throbbing pain.  Severe pain that prevents daily activities.  Pain that is aggravated by any physical activity.  Nausea, vomiting, or both.  Dizziness.  Pain with exposure to bright lights, loud noises, or activity.  General sensitivity to bright lights, loud noises, or smells. Before you get a migraine, you may get warning signs that a migraine is coming (aura). An aura may include:  Seeing flashing lights.  Seeing bright spots, halos, or zig-zag lines.  Having tunnel vision or blurred vision.  Having feelings of numbness or tingling.  Having trouble talking.  Having muscle weakness. MIGRAINE TRIGGERS  Alcohol.  Smoking.  Stress.  Menstruation.  Aged cheeses.  Foods or drinks that contain nitrates, glutamate, aspartame, or tyramine.  Lack of sleep.  Chocolate.  Caffeine.  Hunger.  Physical exertion.  Fatigue.  Medicines used to treat chest pain (nitroglycerine), birth control pills, estrogen, and some blood pressure medicines. DIAGNOSIS  A migraine headache is often diagnosed based on:  Symptoms.  Physical examination.  A CT scan or MRI of your head. TREATMENT Medicines may be given for pain and nausea. Medicines can also be given to help prevent recurrent migraines.  HOME CARE INSTRUCTIONS  Only take over-the-counter or prescription medicines for pain or discomfort as directed by your caregiver. The use of long-term narcotics is not recommended.  Lie down in a dark, quiet room when you have a migraine.  Keep a journal  to find out what may trigger your migraine headaches. For example, write down:  What you eat and drink.  How much sleep you get.  Any change to your diet or medicines.  Limit alcohol consumption.  Quit smoking if you smoke.  Get 7 to 9 hours of sleep, or as recommended by your caregiver.  Limit stress.  Keep lights dim if bright lights bother you and make your migraines worse. SEEK IMMEDIATE MEDICAL CARE IF:   Your migraine becomes severe.  You have a fever.  You have a stiff neck.  You have vision loss.  You have muscular weakness or loss of muscle control.  You start losing your balance or have trouble walking.  You feel faint or pass out.  You have severe symptoms that are different from your first symptoms. MAKE SURE YOU:   Understand these instructions.  Will watch your condition.  Will get help right away if you are not doing well or get worse. Document Released: 07/28/2005 Document Revised: 10/20/2011 Document Reviewed: 07/18/2011 [employer] Patient Information 2014 [employer].  

## 2013-01-14 NOTE — Assessment & Plan Note (Signed)
-   patient's lung exam improved with albuterol treatment in office. She has witnessed cough that was not productive. Offered Law[facility] and she reports they are not covered by medicaid. She will restart robitussin OTC. - Counseled on smoking history likely causing symptoms and cessation would be the best treatment. Patient was not open to that suggestion.  - Do not feel this is infectious, and is likely d/t to her smoking history.  - treated headache with phenergan/toradol/dexamethasone cocktail, that has been used prior. Patient was thankful and walked out of room with normal gait.  - Advised to return if  Her symptoms worsen

## 2013-01-17 ENCOUNTER — Other Ambulatory Visit: Payer: Self-pay | Admitting: *Deleted

## 2013-01-19 ENCOUNTER — Other Ambulatory Visit: Payer: Self-pay | Admitting: *Deleted

## 2013-01-19 NOTE — Telephone Encounter (Signed)
Requested Prescriptions   Pending Prescriptions Disp Refills  . diclofenac (VOLTAREN) 50 MG EC tablet 60 tablet 0    Sig: Take 1 tablet (50 mg total) by mouth 3 (three) times daily.   Wyatt Haste, RN-BSN

## 2013-02-03 ENCOUNTER — Ambulatory Visit: Payer: Medicaid Other

## 2013-02-10 ENCOUNTER — Ambulatory Visit: Payer: Medicaid Other

## 2013-02-15 ENCOUNTER — Ambulatory Visit (INDEPENDENT_AMBULATORY_CARE_PROVIDER_SITE_OTHER): Payer: Medicaid Other | Admitting: Family Medicine

## 2013-02-15 ENCOUNTER — Encounter: Payer: Self-pay | Admitting: Family Medicine

## 2013-02-15 VITALS — BP 113/90 | HR 88 | Temp 98.1°F | Resp 15 | Ht 61.0 in | Wt 174.3 lb

## 2013-02-15 DIAGNOSIS — G43019 Migraine without aura, intractable, without status migrainosus: Secondary | ICD-10-CM

## 2013-02-15 DIAGNOSIS — Z791 Long term (current) use of non-steroidal anti-inflammatories (NSAID): Secondary | ICD-10-CM

## 2013-02-15 DIAGNOSIS — J329 Chronic sinusitis, unspecified: Secondary | ICD-10-CM

## 2013-02-15 MED ORDER — NITROFURANTOIN MACROCRYSTAL 100 MG PO CAPS: 100.0000 mg | ORAL_CAPSULE | Freq: Every day | ORAL | Status: DC

## 2013-02-15 MED ORDER — MOXIFLOXACIN HCL 400 MG PO TABS: 400.0000 mg | ORAL_TABLET | Freq: Every day | ORAL | Status: DC

## 2013-02-15 MED ORDER — KETOROLAC TROMETHAMINE 30 MG/ML IJ SOLN
30.0000 mg | Freq: Once | INTRAMUSCULAR | Status: AC
  Administered 2013-02-15: 30 mg via INTRAMUSCULAR

## 2013-02-15 NOTE — Progress Notes (Signed)
Patient complains of having a headache for the past Week which seems to be getting worse

## 2013-02-15 NOTE — Patient Instructions (Signed)
Thank you for coming in, today! I'm sorry you're not feeling well. We will give you a shot of Toradol today. This should help your headache. I think that your headaches may be worse because of how much medicine you are taking, but it's hard to tell. I want you to make an appointment to see Dr. Pollie Meyer back in about 1-2 weeks. For your cough/sputum production, I have prescribed 7 days of Avelox (moxifloxacin). Talk to Dr. Pollie Meyer about stopping smoking. It will probably help a lot of your problems. You can also call 1-800-QUIT-NOW completely free for help/advice with stopping smoking. Please feel free to call with any questions or concerns at any time, at [phone]. --Dr. Casper Harrison

## 2013-02-16 ENCOUNTER — Other Ambulatory Visit: Payer: Self-pay | Admitting: *Deleted

## 2013-02-16 LAB — BASIC METABOLIC PANEL
CO2: 23 mEq/L (ref 19–32)
Chloride: 110 mEq/L (ref 96–112)
Glucose, Bld: 96 mg/dL (ref 70–99)
Potassium: 4 mEq/L (ref 3.5–5.3)
Sodium: 141 mEq/L (ref 135–145)

## 2013-02-17 ENCOUNTER — Telehealth: Payer: Self-pay | Admitting: Family Medicine

## 2013-02-17 MED ORDER — LAMOTRIGINE 150 MG PO TABS: ORAL_TABLET | ORAL | Status: DC

## 2013-02-17 NOTE — Telephone Encounter (Signed)
To [facility] red team - please call pt and let her know I denied the refill request for her bentyl, as at her last visit we decided she was going to try and come off it. She should let me know if she is still requiring it. Thanks.

## 2013-02-18 ENCOUNTER — Ambulatory Visit: Payer: Medicaid Other

## 2013-02-18 NOTE — Telephone Encounter (Signed)
Pt states [facility] sent RF request but she is no longer taking it.

## 2013-02-20 NOTE — Progress Notes (Signed)
  Subjective:    Patient ID: [name], female    DOB: 03/26/1956, 57 y.o.   MRN: [id number]  HPI: Pt presents to clinic with complaint of migraine for 7 days. Pt has been taking Tylenol, ibuprofen (up to "7 times a day"), Topamax, all without relief. Pt has also tried cutting back on meds in the past without much help. Pt reports she has taken Imitrex in the past, which "makes her sick." Pt states the headache is severe enough that she "gets to the point where she can't walk." Pt denies weakness/numbness/tingling or change in vision. Headache is worse with light/noise, not improved with rest. Pt reports improvement with Toradol injections in clinic and "headache cocktails" in the past. Pt does not request any specific treatment but does "want something."  Pt does also complain of cough with yellow/greenish phlegm production for 3 months. Pt is a current smoker. Pt reports she has chronic bronchitis that is usually only better with abx and she is allergic to many abx. Denies N/V, fever, abdominal pain, chest pain, or SOB worse than usual.  Review of Systems: As above.     Objective:   Physical Exam BP 113/90  Pulse 88  Temp(Src) 98.1 F (36.7 C)  Resp 15  Ht 5\' 1"  (1.549 m)  Wt 174 lb 4.8 oz (79.062 kg)  BMI 32.95 kg/m2  SpO2 96% Gen: non-toxic appearing adult female, in no acute distress but appears very uncomfortable HEENT: audible congestion and loud cough with deep breathing, nasal and posterior oropharyngeal mucosae mildly red/inflammed Cardio: RRR, no murmur Pulm: CTAB, no wheezes, normal WOB Neuro: grossly intact without focal deficit; strength equal/intact bilaterally     Assessment & Plan:

## 2013-02-20 NOTE — Assessment & Plan Note (Signed)
Extensive NSAID (and many other medications) used for chronic migraine, including OTC's and Toradol injections, etc. BMP checked this visit, 0.98, increased from baseline 0.6-0.7 but still WNR. Strongly advised cutting back on medications and f/u with PCP.

## 2013-02-20 NOTE — Assessment & Plan Note (Signed)
Long-standing history of migraine s/p several clinic visits for Toradol etc. Very likely large component of medication overuse as well. Given Toradol today and check BMP due to heavy NSAID use. Advised cutting back medication and f/u with PCP as needed.

## 2013-02-20 NOTE — Assessment & Plan Note (Signed)
Chronic/long standing in nature. No systemic signs/symptoms but complaint of severe productive cough for 3 months. Doubt abx will help much but feel short course is reasonable given length of symptoms and severity of cough/congestion audible during this visit. Rx for Avelox given pt's extensive allergy history. F/u PRN.

## 2013-02-28 ENCOUNTER — Ambulatory Visit (INDEPENDENT_AMBULATORY_CARE_PROVIDER_SITE_OTHER): Payer: Medicaid Other | Admitting: Family Medicine

## 2013-02-28 ENCOUNTER — Encounter: Payer: Self-pay | Admitting: Family Medicine

## 2013-02-28 VITALS — BP 114/76 | HR 100 | Ht 61.0 in | Wt 180.8 lb

## 2013-02-28 DIAGNOSIS — H8110 Benign paroxysmal vertigo, unspecified ear: Secondary | ICD-10-CM

## 2013-02-28 DIAGNOSIS — R51 Headache: Secondary | ICD-10-CM

## 2013-02-28 MED ORDER — PROMETHAZINE HCL 12.5 MG PO TABS
25.0000 mg | ORAL_TABLET | Freq: Once | ORAL | Status: AC
  Administered 2013-02-28: 25 mg via ORAL

## 2013-02-28 MED ORDER — MECLIZINE HCL 25 MG PO TABS: 25.0000 mg | ORAL_TABLET | Freq: Two times a day (BID) | ORAL | Status: DC | PRN

## 2013-02-28 NOTE — Progress Notes (Signed)
Patient ID: [name], female   DOB: 10/15/1955, 57 y.o.   MRN: [id number]   HPI:  Patient presents today to discuss headaches and feeling off balance. She reports that she has fallen twice in the last few days. She fell on Thursday and then on Saturday. She landed on her bottom. Did not hit her head. States her leg got twisted and she got off balance, but she didn't trip. She felt lightheaded prior to falling, and thinks the lightheadedness is getting worse. The lightheadedness is a dizzy/spinning sensation, and she feels like she is going to pass out. When she sits up she feels intoxicated but states she doesn't drink. Denies hearing problems, but says she has had some ringing in her ears. Denies nausea. This has gone on for about one week. She has checked a blood sugar and got in the 120's. States she has been eating and drinking enough. Her husband drove her to the appointment today.  ROS: See HPI. Additionally, endorses some slurred speech but states this has happened whenever she gets a bad headache in the past. Endorses some chest pain with coughing, denies having any chest pain right now. Had recent negative stress test.  PMFSH: still smoking cigarettes.  PHYSICAL EXAM: BP 114/76  Pulse 100  Ht 5\' 1"  (1.549 m)  Wt 180 lb 12.8 oz (82.01 kg)  BMI 34.18 kg/m2 Orthostatic vital signs:  Lying BP 119/79, pulse 89 Sitting BP 120/79, pulse 93 Standing BP 114/76, pulse 100  Gen: NAD HEENT: TM's clear bilaterally Heart: RRR Lungs: CTAB Neuro: PERRL. +dix hallpike maneuver, with horizontal nystagmus and sensation of dizziness. Grip 5/5 bilaterally. Facial muscles intact. Sensation intact to light touch on bilat face. EOMI. Shoulder shrug 5/5 bilat. Speech normal. Knee extension 5/5 bilat.  ASSESSMENT/PLAN:  # Chronic medical problems: -reminded patient to return for fasting lab visit, which she has not done despite prior instruction

## 2013-02-28 NOTE — Patient Instructions (Addendum)
It was great to see you again today!  Call your neurologist and ask to get in with them as soon as possible to discuss your chronic headaches, and what can be done for better control.  I sent in a medicine for your dizziness. I am referring you to PT vestibular rehab for your dizziness. You will get a call to set this up.  Come back one morning for fasting labwork. Call if you have any questions.  If you have any vision changes, weakness or numbness in your body, slurred speech, or other changes, go to the ER immediately.  Come back in 1-2 months to follow up.  Be well, Dr. Pollie Meyer  Benign Positional Vertigo Vertigo means you feel like you or your surroundings are moving when they are not. Benign positional vertigo is the most common form of vertigo. Benign means that the cause of your condition is not serious. Benign positional vertigo is more common in older adults. CAUSES  Benign positional vertigo is the result of an upset in the labyrinth system. This is an area in the middle ear that helps control your balance. This may be caused by a viral infection, head injury, or repetitive motion. However, often no specific cause is found. SYMPTOMS  Symptoms of benign positional vertigo occur when you move your head or eyes in different directions. Some of the symptoms may include:  Loss of balance and falls.  Vomiting.  Blurred vision.  Dizziness.  Nausea.  Involuntary eye movements (nystagmus). DIAGNOSIS  Benign positional vertigo is usually diagnosed by physical exam. If the specific cause of your benign positional vertigo is unknown, your caregiver may perform imaging tests, such as magnetic resonance imaging (MRI) or computed tomography (CT). TREATMENT  Your caregiver may recommend movements or procedures to correct the benign positional vertigo. Medicines such as meclizine, benzodiazepines, and medicines for nausea may be used to treat your symptoms. In rare cases, if your  symptoms are caused by certain conditions that affect the inner ear, you may need surgery. HOME CARE INSTRUCTIONS   Follow your caregiver's instructions.  Move slowly. Do not make sudden body or head movements.  Avoid driving.  Avoid operating heavy machinery.  Avoid performing any tasks that would be dangerous to you or others during a vertigo episode.  Drink enough fluids to keep your urine clear or pale yellow. SEEK IMMEDIATE MEDICAL CARE IF:   You develop problems with walking, weakness, numbness, or using your arms, hands, or legs.  You have difficulty speaking.  You develop severe headaches.  Your nausea or vomiting continues or gets worse.  You develop visual changes.  Your family or friends notice any behavioral changes.  Your condition gets worse.  You have a fever.  You develop a stiff neck or sensitivity to light. MAKE SURE YOU:   Understand these instructions.  Will watch your condition.  Will get help right away if you are not doing well or get worse. Document Released: 05/05/2006 Document Revised: 10/20/2011 Document Reviewed: 04/17/2011 [facility] Patient Information 2014 [place], [place].

## 2013-03-09 ENCOUNTER — Other Ambulatory Visit: Payer: Self-pay | Admitting: *Deleted

## 2013-03-09 DIAGNOSIS — H8110 Benign paroxysmal vertigo, unspecified ear: Secondary | ICD-10-CM | POA: Insufficient documentation

## 2013-03-09 NOTE — Assessment & Plan Note (Signed)
For pt's chronic headache, advised her to follow up with neurology. She is to call there and schedule an appointment. Slurred speech described per patient is chronic and consistent with her prior headaches. No evidence of slurred speech or focal exam findings today, other than +dix hallpike maneuver. Patient frequently presents to our clinic requesting toradol shots for her chronic headache. I would like for her to meet with neurology and work on a better preventive regimen, as we cannot ideally continue to do this long term. Did give a dose of phenergan in the office today to help her feel better in the short term.

## 2013-03-09 NOTE — Assessment & Plan Note (Addendum)
No red flags on history or physical exam. Orthostatics done and are negative. 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 Will rx antivert and refer to physical therapy for vestibular rehabilitation.  Precepted with Dr. Mauricio Po who agrees with this assessment and plan.

## 2013-03-10 MED ORDER — FLUTICASONE PROPIONATE 50 MCG/ACT NA SUSP: 2.0000 | Freq: Every day | NASAL | Status: DC | PRN

## 2013-03-17 ENCOUNTER — Other Ambulatory Visit: Payer: Self-pay

## 2013-03-17 MED ORDER — QUETIAPINE FUMARATE 50 MG PO TABS: 50.0000 mg | ORAL_TABLET | Freq: Every day | ORAL | Status: DC

## 2013-03-23 ENCOUNTER — Other Ambulatory Visit: Payer: Self-pay | Admitting: *Deleted

## 2013-03-29 ENCOUNTER — Ambulatory Visit (INDEPENDENT_AMBULATORY_CARE_PROVIDER_SITE_OTHER): Payer: Medicaid Other | Admitting: Family Medicine

## 2013-03-29 ENCOUNTER — Encounter: Payer: Self-pay | Admitting: Family Medicine

## 2013-03-29 VITALS — BP 123/77 | HR 93 | Temp 98.3°F | Ht 61.0 in | Wt 182.0 lb

## 2013-03-29 DIAGNOSIS — J441 Chronic obstructive pulmonary disease with (acute) exacerbation: Secondary | ICD-10-CM

## 2013-03-29 MED ORDER — LEVOFLOXACIN 500 MG PO TABS: 500.0000 mg | ORAL_TABLET | Freq: Every day | ORAL | Status: DC

## 2013-03-29 MED ORDER — PREDNISONE 20 MG PO TABS: 40.0000 mg | ORAL_TABLET | Freq: Every day | ORAL | Status: DC

## 2013-03-29 NOTE — Progress Notes (Signed)
  Subjective:    Patient ID: [name], female    DOB: September 15, 1955, 57 y.o.   MRN: [id number]  HPI 57 year old female presents with four-day history of cough, she has a history of COPD with previous history of exacerbation, the cough is productive of sputum, she is associated rhinorrhea and nasal congestion, she also has associated headache, denies nausea or vomiting, denies abdominal pain, denies changes in bowel habits, no sore throat, no neck pain, no eye drainage, no ear fullness, she has sick contacts at home with viral symptoms, she has been using her albuterol inhaler every 4 hours which has provided minimal relief of her symptoms, she has continued to use her home Spiriva daily  Social: She is a current every day smoker   Review of Systems  Constitutional: Positive for fever and chills.  HENT: Positive for congestion, rhinorrhea and postnasal drip. Negative for ear pain, neck pain and tinnitus.   Respiratory: Positive for cough and shortness of breath.   Cardiovascular: Negative for chest pain.  Gastrointestinal: Negative for nausea, diarrhea and constipation.       Objective:   Physical Exam Vitals: Reviewed General: Pleasant Caucasian female in no acute distress, able to speak in full sentences HEENT: Head is atraumatic and normocephalic, extraocular movements were intact, pupils are equal round and reactive to light, minimal clear rhinorrhea, bilateral TMs were pearly gray, external ear canals were unremarkable, moist membranes, uvula midline, postnasal drip present, no pharyngeal erythema or exudate, no anterior or posterior cervical lymphadenopathy appreciated  Cardiac: Regular rate and rhythm, S1 and S2 present, no murmurs, no heaves or thrills Respiratory: Mild dyspnea, able to speak in full sentences, good effort, diffuse bilateral inspiratory and expiratory wheezes       Assessment & Plan:  Please see problem specific assessment

## 2013-03-29 NOTE — Assessment & Plan Note (Signed)
COPD exacerbation likely secondary to viral illness -Patient was provided five-day prednisone burst as well as seven-day course of Levaquin 500 mg. -Patient is to continue her home breathing treatments and albuterol as needed -She was counseled to call the office immediately if her symptoms worsen

## 2013-03-29 NOTE — Patient Instructions (Addendum)

## 2013-04-21 ENCOUNTER — Other Ambulatory Visit: Payer: Self-pay | Admitting: Family Medicine

## 2013-04-21 MED ORDER — LORATADINE 10 MG PO CAPS: 10.0000 mg | ORAL_CAPSULE | Freq: Every day | ORAL | Status: DC

## 2013-04-27 ENCOUNTER — Ambulatory Visit: Payer: Medicaid Other

## 2013-05-10 ENCOUNTER — Ambulatory Visit (INDEPENDENT_AMBULATORY_CARE_PROVIDER_SITE_OTHER): Payer: Medicaid Other | Admitting: *Deleted

## 2013-05-10 DIAGNOSIS — Z23 Encounter for immunization: Secondary | ICD-10-CM

## 2013-05-10 NOTE — Progress Notes (Signed)
Pt here today for flu vaccine.  Modupe Shampine Ann, RN  

## 2013-05-12 ENCOUNTER — Ambulatory Visit: Payer: Self-pay | Admitting: Nurse Practitioner

## 2013-05-13 ENCOUNTER — Ambulatory Visit (INDEPENDENT_AMBULATORY_CARE_PROVIDER_SITE_OTHER): Payer: Medicaid Other | Admitting: Family Medicine

## 2013-05-13 ENCOUNTER — Encounter: Payer: Self-pay | Admitting: Family Medicine

## 2013-05-13 VITALS — BP 111/75 | HR 85 | Temp 98.0°F | Ht 61.0 in | Wt 183.0 lb

## 2013-05-13 DIAGNOSIS — H8110 Benign paroxysmal vertigo, unspecified ear: Secondary | ICD-10-CM

## 2013-05-13 DIAGNOSIS — G43019 Migraine without aura, intractable, without status migrainosus: Secondary | ICD-10-CM

## 2013-05-13 DIAGNOSIS — R51 Headache: Secondary | ICD-10-CM

## 2013-05-13 MED ORDER — KETOROLAC TROMETHAMINE 30 MG/ML IJ SOLN
30.0000 mg | Freq: Once | INTRAMUSCULAR | Status: AC
  Administered 2013-05-13: 30 mg via INTRAMUSCULAR

## 2013-05-13 MED ORDER — PROMETHAZINE HCL 25 MG/ML IJ SOLN
12.5000 mg | Freq: Once | INTRAMUSCULAR | Status: AC
  Administered 2013-05-13: 12.5 mg via INTRAMUSCULAR

## 2013-05-13 MED ORDER — MECLIZINE HCL 25 MG PO TABS: 25.0000 mg | ORAL_TABLET | Freq: Two times a day (BID) | ORAL | Status: DC | PRN

## 2013-05-13 NOTE — Patient Instructions (Addendum)
It was great to see you again today!  Please keep your appointment with your neurologist to discuss a better long-term preventive plan for your headaches. Return next week if the headache is worsening or not improved. I refilled the medicine we gave the last time for your dizziness. We will contact you to set up an appointment for vestibular rehabilitation. Please return one morning for fasting labs.  Schedule an appointment with me in one month to follow up on your chronic medical problems.  Be well, Dr. Pollie Meyer

## 2013-05-13 NOTE — Assessment & Plan Note (Signed)
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 Neuro exam nonfocal today. Will refill meclizine for as needed use. Will also re-refer to physical therapy for vestibular rehabilitation.

## 2013-05-13 NOTE — Progress Notes (Signed)
Patient ID: OSHA RANE, female   DOB: [birth year], 57 y.o.   MRN: [id number]   HPI:  Headache: Patient presents complaining of a headache that has worsened over the last couple of weeks. She is a chronic headache and is followed by neurology. She takes Seroquel and Topamax as preventive medications. This headache is typical of her usual headache, however she has increased dizziness with this headache. She gets a throbbing sensation in her head. Also endorses dizziness, lightheadedness and a staggering sensation when she's walking, as if she has consumed a "case of beer". States she is afraid she is going to fall. She denies vision changes, or vomiting. She does have a headache upon waking but also goes to bed with a headache. She has tried ibuprofen, and that didn't help. Typically she comes here and it suggested Toradol and prednisone and it helps her headache. She did not drive here today. She denies fever or neck stiffness. I saw her back in July and treated her for BPPV with meclizine, and had planned to refer her to physical therapy for vestibular rehabilitation, but she states this was never done.  ROS: See HPI  PMFSH: History of chronic daily migraine  PHYSICAL EXAM: BP 111/75  Pulse 85  Temp(Src) 98 F (36.7 C) (Oral)  Ht 5\' 1"  (1.549 m)  Wt 183 lb (83.008 kg)  BMI 34.6 kg/m2 Gen: No acute distress, pleasant and cooperative Heart: Regular rate and rhythm, no murmurs Lungs: Clear to auscultation bilaterally, normal work of breathing Neuro: Speech normal. Face symmetric. Tongue protrudes midline. Pupils equal round and reactive to light. Extraocular movements intact. TMs clear bilaterally. Grip strength 5 out of 5 bilaterally good strength in arms and light bilaterally. Facial sensation intact to light touch. Normal finger-nose-finger. No pronator drift. Negative Romberg. Funduscopic exam appears normal. Positive Dix-Hallpike maneuver. Neck has full range of motion, no meningeal  signs.  ASSESSMENT/PLAN:  # Chronic medical problems:  -Reminded patient to return for fasting lab visit, I have asked her to do this several times now. -Advised her to followup in one month to discuss chronic medical problems.  See problem based charting for additional assessment/plan.  FOLLOW UP: F/u in one month for chronic medical problems. Keep scheduled followup visit with neurology for October 16.

## 2013-05-13 NOTE — Assessment & Plan Note (Signed)
This headache is typical of her normal headache, however has increased dizziness, likely secondary to BPPV. Will give abortive treatment with Toradol 30 mg IM and Phenergan 12.5 mg. Followup if not improved next week. I have asked her to keep her appointment with neurology which is scheduled for later this month, and to work with them on figuring out a better preventive regimen, as she comes in frequently complaining of intractable headache.

## 2013-05-24 ENCOUNTER — Encounter: Payer: Self-pay | Admitting: Family Medicine

## 2013-05-24 ENCOUNTER — Ambulatory Visit: Payer: Medicaid Other | Admitting: Physical Therapy

## 2013-05-24 ENCOUNTER — Telehealth: Payer: Self-pay | Admitting: *Deleted

## 2013-05-24 ENCOUNTER — Ambulatory Visit (INDEPENDENT_AMBULATORY_CARE_PROVIDER_SITE_OTHER): Payer: Medicaid Other | Admitting: Family Medicine

## 2013-05-24 VITALS — BP 108/73 | HR 79 | Temp 98.6°F | Ht 61.0 in | Wt 181.9 lb

## 2013-05-24 DIAGNOSIS — J441 Chronic obstructive pulmonary disease with (acute) exacerbation: Secondary | ICD-10-CM

## 2013-05-24 MED ORDER — SIMVASTATIN 40 MG PO TABS: 40.0000 mg | ORAL_TABLET | Freq: Every day | ORAL | Status: DC

## 2013-05-24 MED ORDER — TIOTROPIUM BROMIDE MONOHYDRATE 18 MCG IN CAPS: 18.0000 ug | ORAL_CAPSULE | Freq: Every day | RESPIRATORY_TRACT | Status: DC

## 2013-05-24 MED ORDER — LEVOFLOXACIN 500 MG PO TABS: 500.0000 mg | ORAL_TABLET | Freq: Every day | ORAL | Status: DC

## 2013-05-24 MED ORDER — PREDNISONE 20 MG PO TABS: 40.0000 mg | ORAL_TABLET | Freq: Every day | ORAL | Status: DC

## 2013-05-24 MED ORDER — ALBUTEROL SULFATE HFA 108 (90 BASE) MCG/ACT IN AERS: 2.0000 | INHALATION_SPRAY | Freq: Four times a day (QID) | RESPIRATORY_TRACT | Status: DC | PRN

## 2013-05-24 NOTE — Assessment & Plan Note (Signed)
Assessment: mild COPD exacerbation without hypoxemia or increased work of breathing on exam likely due to respiratory infection Plan: treat with prednisone 40 mg daily for 5 days and Levaquin 500 milligrams daily for 7 days, patient will follow up for worsening shortness of breath or inability to tolerate by mouth medications

## 2013-05-24 NOTE — Telephone Encounter (Signed)
Rx sent 

## 2013-05-24 NOTE — Patient Instructions (Signed)
It was nice to see yo today [name]. Please start taking the prednisone and levaquin as soon as you can. Also, you can use cough drops to help. If you have severe shortness, then please come back.   Remember, that quitting cigarettes is better than any medicine I can give you.   Take care,   Dr. Clinton Sawyer

## 2013-05-24 NOTE — Telephone Encounter (Signed)
[facility] calling asking for refills on patients simvastatin. Will forward to MD.

## 2013-05-24 NOTE — Progress Notes (Signed)
  Subjective:    Patient ID: [name], female    DOB: 1956-05-03, 57 y.o.   MRN: [id number]  HPI  57 year old F who pesents with cough. It started Saturday. It has worsened and she is now using albuterol every for hours. Has COPD at baseline. Is on Albuterol and Spiriva at baseline. Has previously been on Advair, which she couldn't tolerate due to ulcers in her mouth. She denies fever, chills or myalgia. Has had flu vaccine this year. Last exacerbation in August - resolved after prednisone and levaquin.   Cigarettes - 1 ppd , not interested in quitting   Review of Systems See history of present illness    Objective:   Physical Exam BP 108/73  Pulse 79  Temp(Src) 98.6 F (37 C) (Oral)  Ht 5\' 1"  (1.549 m)  Wt 181 lb 14.4 oz (82.509 kg)  BMI 34.39 kg/m2  SpO2 93%  Gen.: obese middle-aged female, chronically ill-appearing but nontoxic Oropharynx: no erythema or exudate Neck: no lymphadenopathy Cardiovascular: regular rate and rhythm Lungs: mild wheezes bibasilar with decreased air movement in all fields     Assessment & Plan:

## 2013-05-26 ENCOUNTER — Encounter: Payer: Self-pay | Admitting: Nurse Practitioner

## 2013-05-26 ENCOUNTER — Ambulatory Visit (INDEPENDENT_AMBULATORY_CARE_PROVIDER_SITE_OTHER): Payer: Medicaid Other | Admitting: Nurse Practitioner

## 2013-05-26 VITALS — BP 112/67 | HR 75 | Temp 98.3°F | Ht 60.25 in | Wt 185.0 lb

## 2013-05-26 DIAGNOSIS — Z1231 Encounter for screening mammogram for malignant neoplasm of breast: Secondary | ICD-10-CM

## 2013-05-26 DIAGNOSIS — G43019 Migraine without aura, intractable, without status migrainosus: Secondary | ICD-10-CM

## 2013-05-26 MED ORDER — TOPIRAMATE 100 MG PO TABS: ORAL_TABLET | ORAL | Status: DC

## 2013-05-26 MED ORDER — QUETIAPINE FUMARATE 50 MG PO TABS: 50.0000 mg | ORAL_TABLET | Freq: Every day | ORAL | Status: DC

## 2013-05-26 MED ORDER — TRAMADOL HCL 50 MG PO TABS: ORAL_TABLET | ORAL | Status: DC

## 2013-05-26 NOTE — Progress Notes (Signed)
[facility]  PATIENT: [name] DOB: May 15, 1956   REASON FOR VISIT: follow up HISTORY FROM: patient  HISTORY OF PRESENT ILLNESS: Patient returns for followup after last visit with Darrol Angel, NP on 11/24/12. She has a long history of chronic daily headaches. She also has sleep apnea and uses CPAP which is helped somewhat with her early morning headaches. Her headaches are generalized in nature along with some chronic neck discomfort and neck stiffness. Gabapentin has not been helpful in the past. She is currently on Topamax and Seroquel. She continues to smoke a pack a day and was encouraged to quit. She reports that she takes 6-8 Tylenol a day and was made aware that it can cause rebound headaches.   UPDATE 05/26/13 (LL):  [name] returns for follow up for headaches.  She states that her headaches have really not changed.  She takes the preventative medication as prescribed, but she doesn't think they have made much difference.  She does sleep better with Seroquel though. She takes ibuprofen for her headaches nearly every day.  When asked about readiness to stop smoking, she states "I have too much stress already to try to stop smoking."  She is not able to take triptans.    ROS: - weight loss, chest pain, swelling in legs, ringing in ears, loss of vision, SOB, wheezing, snoring, constipation, easy bruising, feeling cold, increased thrist, joint pain, joint swelling, aching muscles, allergies, headache, weakness, slurred speech, dizziness, snoring   ALLERGIES: Allergies  Allergen Reactions  . Amoxicillin Anaphylaxis  . Azithromycin Swelling and Rash    "swelling all over"  . Doxycycline Hyclate Anaphylaxis and Rash  . Penicillins Shortness Of Breath and Rash    "almost died"  . Sulfonamide Derivatives Rash  . Chantix [Varenicline] Other (See Comments)    "bad dreams"  . Clarithromycin Hives  . Fluticasone-Salmeterol Other (See Comments)    REACTION: ulcers  in mouth  . Nortriptyline Other (See Comments)    Per patient had "kidney problems" on this medication    HOME MEDICATIONS: Outpatient Prescriptions Prior to Visit  Medication Sig Dispense Refill  . acetaminophen (TYLENOL) 325 MG tablet Take 2 tablets (650 mg total) by mouth every 6 (six) hours as needed for pain or fever (greater than 101). For pain      . albuterol (PROAIR HFA) 108 (90 BASE) MCG/ACT inhaler Inhale 2 puffs into the lungs every 6 (six) hours as needed. Shortness of breath  1 Inhaler  3  . albuterol (PROVENTIL) (2.5 MG/3ML) 0.083% nebulizer solution Take 2.5 mg by nebulization every 6 (six) hours as needed. For shortness of breath      . Ascorbic Acid (VITAMIN C) 100 MG tablet Take 100 mg by mouth daily.      Marland Kitchen aspirin EC 81 MG tablet Take 1 tablet (81 mg total) by mouth daily.      . Blood Glucose Monitoring Suppl (BLOOD GLUCOSE METER) kit Use to check blood glucose daily.  Accuchek preffered by patient  1 each  0  . Cholecalciferol (VITAMIN D3) 2000 UNITS TABS Take 1 tablet by mouth daily.  30 tablet  1  . ferrous sulfate 325 (65 FE) MG tablet Take 1 tablet (325 mg total) by mouth 2 (two) times daily with a meal.  60 tablet  3  . fluticasone (FLONASE) 50 MCG/ACT nasal spray Place 2 sprays into the nose daily as needed. For congestion.  16 g  1  . Glucose Blood (BLOOD GLUCOSE  TEST STRIPS) STRP Use to test blood glucose daily.  100 each  12  . lamoTRIgine (LAMICTAL) 150 MG tablet Take 1 tablet (150 mg total) by mouth 2 (two) times daily.  60 tablet  2  . levofloxacin (LEVAQUIN) 500 MG tablet Take 1 tablet (500 mg total) by mouth daily.  7 tablet  0  . Loratadine 10 MG CAPS Take 1 capsule (10 mg total) by mouth daily.  30 each  2  . meclizine (ANTIVERT) 25 MG tablet Take 1 tablet (25 mg total) by mouth 2 (two) times daily as needed for dizziness.  20 tablet  0  . omeprazole (PRILOSEC) 40 MG capsule Take 1 capsule (40 mg total) by mouth 2 (two) times daily.  60 capsule  5  .  predniSONE (DELTASONE) 20 MG tablet Take 2 tablets (40 mg total) by mouth daily.  10 tablet  0  . simvastatin (ZOCOR) 40 MG tablet Take 1 tablet (40 mg total) by mouth at bedtime.  30 tablet  3  . tiotropium (SPIRIVA) 18 MCG inhalation capsule Place 1 capsule (18 mcg total) into inhaler and inhale daily.  30 capsule  11  . QUEtiapine (SEROQUEL) 50 MG tablet Take 1 tablet (50 mg total) by mouth at bedtime. for headaches  90 tablet  0  . topiramate (TOPAMAX) 100 MG tablet 1tab in the am, 2 at bedtime  270 tablet  1   No facility-administered medications prior to visit.    PAST MEDICAL HISTORY: Past Medical History  Diagnosis Date  . Allergic rhinitis   . Hypertension   . Hyperlipidemia   . GERD (gastroesophageal reflux disease)   . Glaucoma   . Neck pain     Cervical disc  . IBS (irritable bowel syndrome)   . Esophageal stricture   . Diverticulosis   . Right leg DVT 1982    groin  . COPD (chronic obstructive pulmonary disease)   . Emphysema   . Pneumonia 2012; 04/27/2012  . Chronic bronchitis     "keep it all the time" (04/27/2012)  . Exertional dyspnea   . Type II diabetes mellitus     No meds since weight loss  . Migraine   . Epilepsy   . DJD (degenerative joint disease)   . Arthritis     "hands and legs and feet" (04/27/2012)  . Depression   . OSA on CPAP     6-7 yrs  . UTI (urinary tract infection)     2 weeks ago  . Cancer 05    rt arm mole rem cancer    PAST SURGICAL HISTORY: Past Surgical History  Procedure Laterality Date  . Tubal ligation  2001  . Knee arthroscopy  1990's    left  . Carpal tunnel release  ~ 2008    right  . Shoulder open rotator cuff repair  ~ 2010    left  . Abdominal hysterectomy  2001    "partial"  . 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 Total knee arthroplasty  08/23/2012    Procedure: TOTAL KNEE ARTHROPLASTY;  Surgeon: Nilda Simmer, MD;  Location: [facility] OR;  Service: Orthopedics;  Laterality: Right;  RIGHT KNEE  ARTHROPLASTY MEDIAL AND LATERAL COMPARTMENTS WITH PATELLA RESURFACING    FAMILY HISTORY: Family History  Problem Relation Age of Onset  . Heart disease Maternal Grandfather   . Diabetes Maternal Grandfather   . Heart disease      uncle  . Breast cancer Maternal  Aunt   . Alcohol abuse Father   . Lung cancer Father     was a smoker  . Emphysema Father     was a smoker  . Bipolar disorder Son   . Muscular dystrophy Son   . Diabetes      uncles/aunts  . Heart disease Sister   . Colon cancer Neg Hx   . Lung cancer Mother     was a smoker  . Stroke Mother   . Other Sister     blood clotting disorder  . Heart disease Son     SOCIAL HISTORY: History   Social History  . Marital Status: Married    Spouse Name: lacy    Number of Children: 2  . Years of Education: 12   Occupational History  . disabled    Social History Main Topics  . Smoking status: Current Every Day Smoker -- 1.00 packs/day for 36 years    Types: Cigarettes    Start date: 08/12/1975  . Smokeless tobacco: Former Neurosurgeon    Quit date: 08/23/2012     Comment: started back smoking in 11/2012  . Alcohol Use: No     Comment: drinks 2 sodas a day  . Drug Use: No  . Sexual Activity: No   Other Topics Concern  . Not on file   Social History Narrative  . No narrative on file    PHYSICAL EXAM  Filed Vitals:   05/26/13 1501  BP: 112/67  Pulse: 75  Temp: 98.3 F (36.8 C)  Height: 5' 0.25" (1.53 m)  Weight: 185 lb (83.915 kg)   Body mass index is 35.85 kg/(m^2).  General: well developed, well nourished, seated, in no evident distress, HEAVY ODOR OF CIGARETTES. Head: head normocephalic and atraumatic. Oropharynx benign  Neck: supple with no carotid or supraclavicular bruits  Cardiovascular: regular rate and rhythm, no murmurs   Neurologic Exam  Mental Status: Awake and fully alert. Oriented to place and time. Recent and remote memory intact. Attention span, concentration and fund of knowledge  appropriate. Mood and affect appropriate.  Cranial Nerves: Pupils equal, briskly reactive to light. Extraocular movements full with end gaze nystagmus bilaterally. Visual fields full to confrontation. Hearing intact and symmetric to finger snap. Facial sensation intact. Face, tongue, palate move normally and symmetrically. Neck flexion and extension normal.  Motor: Normal bulk and tone. Normal strength in all tested extremity muscles.  Sensory.: intact to touch and pinprick and vibratory.  Coordination: Rapid alternating movements normal in all extremities. Finger-to-nose and heel-to-shin performed accurately bilaterally.  Gait and Station: Arises from chair without difficulty. Stance is normal. Able to heel, toe and unsteady with tandem walk.  Reflexes: 1+ and symmetric. Toes downgoing.   DIAGNOSTIC DATA (LABS, IMAGING, TESTING) - I reviewed patient records, labs, notes, testing and imaging myself where available.  Lab Results  Component Value Date   WBC 12.5* 09/06/2012   HGB 10.0* 09/14/2012   HCT 27.9* 09/06/2012   MCV 94.9 09/06/2012   PLT 347 09/06/2012      Component Value Date/Time   NA 141 02/15/2013 1439   K 4.0 02/15/2013 1439   CL 110 02/15/2013 1439   CO2 23 02/15/2013 1439   GLUCOSE 96 02/15/2013 1439   BUN 18 02/15/2013 1439   CREATININE 0.98 02/15/2013 1439   CREATININE 1.12* 09/06/2012 0646   CALCIUM 8.8 02/15/2013 1439   PROT 6.8 09/01/2012 1630   ALBUMIN 2.3* 09/01/2012 1630   AST 39* 09/01/2012 1630  ALT 36* 09/01/2012 1630   ALKPHOS 174* 09/01/2012 1630   BILITOT 0.3 09/01/2012 1630   GFRNONAA 55* 09/06/2012 0646   GFRAA 63* 09/06/2012 0646   ASSESSMENT:  [name] is a 57 year old Caucasian female with history of Seizures, Anxiety, Panic Attack, COPD, Tobacco Use Disorder, Hyperlipidemia, and Glaucoma, and Obstructive sleep apnea, here with Chronic Daily Headaches.   PLAN:  Continue topiramate for headache prevention, refills sent. Continue Seroquel, refills sent.  Start  Tramadol 50 mg, 1-2 tablets every 6-8 hours for headache. Recommended to Stop smoking, likely contributing to daily headaches. Given information to read on rebound headache, minimize ibuprofen. Followup in 6 months with Eber Jones, NP.  Meds ordered this encounter  Medications  . traMADol (ULTRAM) 50 MG tablet    Sig: Take 1-2 tablets every 6-8 hours as needed for headache.    Dispense:  30 tablet    Refill:  5    Order Specific Question:  Supervising Provider    Answer:  Stephanie Acre K [[id number]]  . topiramate (TOPAMAX) 100 MG tablet    Sig: 1tab in the am, 2 at bedtime    Dispense:  270 tablet    Refill:  3    Order Specific Question:  Supervising Provider    Answer:  Stephanie Acre K [[id number]]  . QUEtiapine (SEROQUEL) 50 MG tablet    Sig: Take 1 tablet (50 mg total) by mouth at bedtime. for headaches    Dispense:  90 tablet    Refill:  3    Order Specific Question:  Supervising Provider    Answer:  York Spaniel [[id number]]   Tawny Asal Sylvester Salonga, MSN, NP-C 05/26/2013, 7:52 PM [facility] [address] [place], [place] [address] [phone]

## 2013-05-26 NOTE — Patient Instructions (Signed)
Continue Topamax and Seroquil.  I am giving you a script for Tramadol for headaches, you may take 1-2 tablets every 6-8 hours as needed for headache.  Cambia powders samples, use only with water.  Follow up with [name] in 6 months.

## 2013-05-26 NOTE — Progress Notes (Signed)
I have read the note, and I agree with the clinical assessment and plan.  WILLIS,CHARLES KEITH   

## 2013-05-31 ENCOUNTER — Ambulatory Visit: Payer: Medicaid Other | Admitting: Physical Therapy

## 2013-06-08 ENCOUNTER — Other Ambulatory Visit: Payer: Self-pay | Admitting: Family Medicine

## 2013-06-09 ENCOUNTER — Ambulatory Visit: Payer: Medicaid Other | Attending: Family Medicine | Admitting: Rehabilitative and Restorative Service Providers"

## 2013-06-30 ENCOUNTER — Ambulatory Visit (INDEPENDENT_AMBULATORY_CARE_PROVIDER_SITE_OTHER): Payer: Medicaid Other | Admitting: Family Medicine

## 2013-06-30 VITALS — BP 119/52 | HR 74 | Temp 98.2°F | Ht 60.25 in | Wt 179.0 lb

## 2013-06-30 DIAGNOSIS — R51 Headache: Secondary | ICD-10-CM

## 2013-06-30 DIAGNOSIS — J209 Acute bronchitis, unspecified: Secondary | ICD-10-CM

## 2013-06-30 DIAGNOSIS — J441 Chronic obstructive pulmonary disease with (acute) exacerbation: Secondary | ICD-10-CM

## 2013-06-30 MED ORDER — KETOROLAC TROMETHAMINE 30 MG/ML IJ SOLN
30.0000 mg | Freq: Once | INTRAMUSCULAR | Status: AC
  Administered 2013-06-30: 30 mg via INTRAMUSCULAR

## 2013-06-30 MED ORDER — PREDNISONE 20 MG PO TABS: 40.0000 mg | ORAL_TABLET | Freq: Every day | ORAL | Status: DC

## 2013-06-30 MED ORDER — PREDNISONE 20 MG PO TABS: 20.0000 mg | ORAL_TABLET | Freq: Every day | ORAL | Status: DC

## 2013-06-30 MED ORDER — METHYLPREDNISOLONE ACETATE 40 MG/ML IJ SUSP
40.0000 mg | Freq: Once | INTRAMUSCULAR | Status: AC
  Administered 2013-06-30: 40 mg via INTRAMUSCULAR

## 2013-06-30 NOTE — Assessment & Plan Note (Addendum)
Had COPD exacerbation treatment 3 weeks ago with prednisone and Levaquin. No hypoxia. Solu-Medrol given in the office, continue oral prednisone for 5 days. Will obtain x-ray to further evaluate condition.

## 2013-06-30 NOTE — Progress Notes (Signed)
Family Medicine Office Visit Note   Subjective:   Patient ID: [name], female  DOB: 04-22-1956, 57 y.o.. MRN: [id number]   Pt that comes today for same day appointment complaining of bronchitis symptoms for 2 weeks. She has been using her breathing treatments at home with no resolution of her symptoms. Since she reports chest tightening, but denies SOB, pain or palpitations. She also denies fever, chills, nausea, or vomiting.   Her other issue she would like addressed today is her migraines: She has been with intractable migraines for 2 days today. Patient requesting medication for her pain. She denies any weakness or numbness. Patient described his headache as her usual migraine pain.  Review of Systems:  Per HPI  Objective:   Physical Exam: Gen:  NAD HEENT: Moist mucous membranes  CV: Regular rate and rhythm, no murmurs rubs or gallops PULM: Decreased breath sounds bilaterally with wheezing throughout. No rales. ABD: Soft, non tender, non distended, normal bowel sounds EXT: No edema Neuro: Alert and oriented x3. No focalization  Assessment & Plan:

## 2013-06-30 NOTE — Patient Instructions (Addendum)
Bronchitis Bronchitis is the body's way of reacting to injury and/or infection (inflammation) of the bronchi. Bronchi are the air tubes that extend from the windpipe into the lungs. If the inflammation becomes severe, it may cause shortness of breath. CAUSES  Inflammation may be caused by:  A virus.  Germs (bacteria).  Dust.  Allergens.  Pollutants and many other irritants. The cells lining the bronchial tree are covered with tiny hairs (cilia). These constantly beat upward, away from the lungs, toward the mouth. This keeps the lungs free of pollutants. When these cells become too irritated and are unable to do their job, mucus begins to develop. This causes the characteristic cough of bronchitis. The cough clears the lungs when the cilia are unable to do their job. Without either of these protective mechanisms, the mucus would settle in the lungs. Then you would develop pneumonia. Smoking is a common cause of bronchitis and can contribute to pneumonia. Stopping this habit is the single most important thing you can do to help yourself. TREATMENT   Your caregiver may prescribe an antibiotic if the cough is caused by bacteria. Also, medicines that open up your airways make it easier to breathe. Your caregiver may also recommend or prescribe an expectorant. It will loosen the mucus to be coughed up. Only take over-the-counter or prescription medicines for pain, discomfort, or fever as directed by your caregiver.  Removing whatever causes the problem (smoking, for example) is critical to preventing the problem from getting worse.  Cough suppressants may be prescribed for relief of cough symptoms.  Inhaled medicines may be prescribed to help with symptoms now and to help prevent problems from returning.  For those with recurrent (chronic) bronchitis, there may be a need for steroid medicines. SEEK IMMEDIATE MEDICAL CARE IF:   During treatment, you develop more pus-like mucus (purulent  sputum).  You have a fever.  You become progressively more ill.  You have increased difficulty breathing, wheezing, or shortness of breath. It is necessary to seek immediate medical care if you are elderly or sick from any other disease. MAKE SURE YOU:   Understand these instructions.  Will watch your condition.  Will get help right away if you are not doing well or get worse.   I have prescribed you prednisone, start taking it tomorrow since you have been treated  for today with intramuscular preparation. I have placed an x-ray to evaluate the need for antibiotic, I will call you with the results and plan. Followup your primary doctor.

## 2013-06-30 NOTE — Assessment & Plan Note (Signed)
Neurologic same is reassuring. Toradol given in the office. Followup as needed

## 2013-07-07 ENCOUNTER — Other Ambulatory Visit: Payer: Self-pay | Admitting: Family Medicine

## 2013-07-07 MED ORDER — OMEPRAZOLE 40 MG PO CPDR: 40.0000 mg | DELAYED_RELEASE_CAPSULE | Freq: Two times a day (BID) | ORAL | Status: DC

## 2013-07-07 MED ORDER — LORATADINE 10 MG PO CAPS: 10.0000 mg | ORAL_CAPSULE | Freq: Every day | ORAL | Status: DC

## 2013-07-25 ENCOUNTER — Ambulatory Visit: Payer: Medicaid Other | Admitting: Family Medicine

## 2013-07-27 ENCOUNTER — Encounter: Payer: Self-pay | Admitting: Family Medicine

## 2013-07-27 ENCOUNTER — Ambulatory Visit
Admission: RE | Admit: 2013-07-27 | Discharge: 2013-07-27 | Disposition: A | Discharge status: Home / Self Care | Payer: Medicaid Other | Source: Ambulatory Visit | Attending: Family Medicine | Admitting: Family Medicine

## 2013-07-27 ENCOUNTER — Ambulatory Visit (INDEPENDENT_AMBULATORY_CARE_PROVIDER_SITE_OTHER): Payer: Medicaid Other | Admitting: Family Medicine

## 2013-07-27 VITALS — BP 119/75 | HR 82 | Temp 98.1°F | Ht 60.0 in | Wt 176.0 lb

## 2013-07-27 DIAGNOSIS — R296 Repeated falls: Secondary | ICD-10-CM

## 2013-07-27 DIAGNOSIS — D62 Acute posthemorrhagic anemia: Secondary | ICD-10-CM

## 2013-07-27 DIAGNOSIS — G43019 Migraine without aura, intractable, without status migrainosus: Secondary | ICD-10-CM

## 2013-07-27 DIAGNOSIS — Z9181 History of falling: Secondary | ICD-10-CM

## 2013-07-27 DIAGNOSIS — J449 Chronic obstructive pulmonary disease, unspecified: Secondary | ICD-10-CM

## 2013-07-27 DIAGNOSIS — J441 Chronic obstructive pulmonary disease with (acute) exacerbation: Secondary | ICD-10-CM

## 2013-07-27 LAB — CBC
HCT: 36.4 % (ref 36.0–46.0)
Hemoglobin: 12.5 g/dL (ref 12.0–15.0)
MCH: 31 pg (ref 26.0–34.0)
MCHC: 34.3 g/dL (ref 30.0–36.0)
MCV: 90.3 fL (ref 78.0–100.0)

## 2013-07-27 LAB — COMPREHENSIVE METABOLIC PANEL
ALT: <8 U/L (ref 0–35)
Albumin: 3.8 g/dL (ref 3.5–5.2)
CO2: 25 mEq/L (ref 19–32)
Calcium: 8.9 mg/dL (ref 8.4–10.5)
Chloride: 106 mEq/L (ref 96–112)
Glucose, Bld: 85 mg/dL (ref 70–99)
Potassium: 4.2 mEq/L (ref 3.5–5.3)
Sodium: 139 mEq/L (ref 135–145)
Total Protein: 7 g/dL (ref 6.0–8.3)

## 2013-07-27 MED ORDER — PREDNISONE 50 MG PO TABS: ORAL_TABLET | ORAL | Status: DC

## 2013-07-27 MED ORDER — KETOROLAC TROMETHAMINE 30 MG/ML IJ SOLN
30.0000 mg | Freq: Once | INTRAMUSCULAR | Status: AC
  Administered 2013-07-27: 30 mg via INTRAMUSCULAR

## 2013-07-27 NOTE — Progress Notes (Addendum)
Patient ID: [name], female   DOB: [birth year], 57 y.o.   MRN: [id number]  HPI:  Bronchitis: pt reports she has had a cold for about one month now. She thinks it is bronchitis. She has been coughing up phlegm. Doesn't think she is getting any better. She's having to do breathing treatments at home about once per day. Denies having any fevers, night sweats, or hemoptysis.  Headache: has history of chronic migraines and frequently presents requesting toradol injections. States that her headache is very bad today. She is unable to sit up straight from the pain. It feels like her normal migraine. It is a throbbing pain. She does feel lightheaded from the pain. Denies vision changes or tingling/numbness in her body but feels slightly weak in her legs. Normal stooling and urination but has some stress incontinence with coughing.   Frequent falls: has fallen about 2-3 times per month. She wakes up in the morning and is still in her back. She is unsure if she would be able to go to physical therapy because we previously tried to refer her there for vestibular rehab and she was told that medicaid would not pay for it. Reports that her dad was diagnosed with muscular dystrophy in his 2's. She says she passed out twice in the last week, actually lost consciousness. Both times this happened was immediately after she coughed very hard. She thinks she was out for about 2 seconds. She did not hit her head. This scared her because her grandchildren are usually with her and they are ages 4 and 71.   ROS: See HPI  PMFSH: hx chronic migraines and COPD. She does smoke cigarettes.  PHYSICAL EXAM: BP 119/75  Pulse 82  Temp(Src) 98.1 F (36.7 C) (Oral)  Ht 5' (1.524 m)  Wt 176 lb (79.833 kg)  BMI 34.37 kg/m2 Gen: NAD HEENT: NCAT, PERRL, EOMI, no meningeal signs. Large 2cm R anterior cervical lymph node present. Heart: RRR Lungs: mild expiratory wheezes throughout, normal respiratory effort, no focal crackles or  diminished air movement Neuro: grossly nonfocal, grip 5/5 bilat, unable to walk on toes or heels without holding onto nearby objects, but otherwise ambulates normally. speech normal, tongue protrudes mildline, face symmetric, shoulder shrug 5/5, SCM 5/5 bilat  ASSESSMENT/PLAN:  See problem based charting for additional assessment/plan.  FOLLOW UP: F/u in 2-3 weeks for headaches. Will need to discuss physical therapy referral at that visit.  [place] J. Pollie Meyer, MD [facility]

## 2013-07-27 NOTE — Patient Instructions (Addendum)
It was great to see you again today!  I am sending in a prescription for 5 days of steroids. Please go right now and get your chest xray from at the hospital. Please schedule an appointment with Dr. Raymondo Band to discuss COPD control. We are checking some bloodwork today. I will call you if your test results are not normal.  Otherwise, I will send you a letter.  If you do not hear from me with in 2 weeks please call our office.      For your headache, we will do some toradol today. Please follow up with me in 3-4 weeks. Complete a daily headache diary prior to that visit.  Be well, Dr. Pollie Meyer

## 2013-07-28 ENCOUNTER — Telehealth: Payer: Self-pay | Admitting: Family Medicine

## 2013-07-28 ENCOUNTER — Encounter: Payer: Self-pay | Admitting: Family Medicine

## 2013-07-28 MED ORDER — LEVOFLOXACIN 500 MG PO TABS: 500.0000 mg | ORAL_TABLET | Freq: Every day | ORAL | Status: DC

## 2013-07-28 NOTE — Telephone Encounter (Signed)
Fwd. To Dr.McIntyre for review. [name], [name]

## 2013-07-28 NOTE — Telephone Encounter (Signed)
Wants to know results of chest xray

## 2013-07-28 NOTE — Telephone Encounter (Signed)
CXR shows possible developing pneumonia. Will send in course of antibiotic (levaquin) for her to take. She should return to clinic or go to the ER if she is not better within a few days, or if she has worsening symptoms. Please call patient to inform.   Latrelle Dodrill, MD

## 2013-07-28 NOTE — Telephone Encounter (Signed)
Called and spoke with patient and relayed the message below.  Veronica Dodrill, MD

## 2013-08-01 ENCOUNTER — Telehealth: Payer: Self-pay | Admitting: Family Medicine

## 2013-08-01 NOTE — Telephone Encounter (Signed)
Will fwd to PCP, but may need OV. Lorenda Hatchet, Renato Battles

## 2013-08-01 NOTE — Telephone Encounter (Signed)
Pt needs some for her nerves. Is gittery, snapping at people, crying She has never felt like to this before

## 2013-08-01 NOTE — Telephone Encounter (Signed)
Pt will need office visit to discuss this. I cannot send in medicine for this problem over the phone without first evaluating her for it. Please inform pt.  Latrelle Dodrill, MD

## 2013-08-01 NOTE — Telephone Encounter (Signed)
Message given to patient, she needs an OV with her PCP to discuss.  Tomara Youngberg, Darlyne Russian, CMA

## 2013-08-02 DIAGNOSIS — R296 Repeated falls: Secondary | ICD-10-CM | POA: Insufficient documentation

## 2013-08-02 NOTE — Assessment & Plan Note (Signed)
Presents with typical migraine. Neuro exam nonfocal other than inability to walk on toes/heels. Suspect this may be related to chronic deconditioning and that pt would benefit from physical therapy evaluation see separate discussion. Will give toradol 30mg  IM today as this typically works for patient. Was seen by neurology within the last several months, but no changes to her medication regimen were made despite the frequency of her migraines. Given the frequency with which she has presented to clinic complaining of migraine (almost monthly) I have asked her to return for a visit with me to specifically address her migraines and to complete a headache diary before that. Pt agreeable to this plan. Precepted with Dr. Gwendolyn Grant who agrees with this plan.

## 2013-08-02 NOTE — Assessment & Plan Note (Signed)
Pt mentioned frequent falls today, see hx above. Suspect this may be related to chronic weakness/deconditioning. I would like to have her evaluated by physical therapy. Given that this problem is somewhat chronic in nature and we are dealing with two acute issues today (headache and COPD exacerbation/bronchitis), will defer physical therapy referral at this time. Will discuss with patient at f/u visit in 2-3 weeks.

## 2013-08-02 NOTE — Assessment & Plan Note (Signed)
Checking CBC today for white count, will also evaluate Hgb to ensure it has improved.

## 2013-08-02 NOTE — Assessment & Plan Note (Addendum)
No hypoxia today or respiratory distress to warrant hospitalization. Pt is mildly wheezy on exam. Will rx prednisone five day burst and have pt go directly to [facility] today to get CXR to evaluate for infiltrate or other process contributing to her chronic sputum production (especially given prominent cervical lymphadenopathy noted on exam today). No overt signs suggestive of need for abx at this time but will see what CXR shows. Will also check CBC to evaluate for leukocytosis. Given the high frequency of her presentations for COPD exacerbation I would also like for her to be seen by Dr. Raymondo Band to discuss additional control measures for her COPD.

## 2013-08-06 ENCOUNTER — Emergency Department (HOSPITAL_COMMUNITY): Payer: Medicaid Other

## 2013-08-06 ENCOUNTER — Inpatient Hospital Stay (HOSPITAL_COMMUNITY): Payer: Medicaid Other

## 2013-08-06 ENCOUNTER — Inpatient Hospital Stay (HOSPITAL_COMMUNITY)
Admission: EM | Admit: 2013-08-06 | Discharge: 2013-08-09 | DRG: 189 | Disposition: A | Discharge status: Home Health | Payer: Medicaid Other | Attending: Family Medicine | Admitting: Family Medicine

## 2013-08-06 ENCOUNTER — Encounter (HOSPITAL_COMMUNITY): Payer: Self-pay | Admitting: Emergency Medicine

## 2013-08-06 DIAGNOSIS — E669 Obesity, unspecified: Secondary | ICD-10-CM | POA: Diagnosis present

## 2013-08-06 DIAGNOSIS — K222 Esophageal obstruction: Secondary | ICD-10-CM | POA: Diagnosis present

## 2013-08-06 DIAGNOSIS — Z6379 Other stressful life events affecting family and household: Secondary | ICD-10-CM

## 2013-08-06 DIAGNOSIS — F329 Major depressive disorder, single episode, unspecified: Secondary | ICD-10-CM | POA: Diagnosis present

## 2013-08-06 DIAGNOSIS — Z803 Family history of malignant neoplasm of breast: Secondary | ICD-10-CM

## 2013-08-06 DIAGNOSIS — Z8249 Family history of ischemic heart disease and other diseases of the circulatory system: Secondary | ICD-10-CM

## 2013-08-06 DIAGNOSIS — F17200 Nicotine dependence, unspecified, uncomplicated: Secondary | ICD-10-CM | POA: Diagnosis present

## 2013-08-06 DIAGNOSIS — Z801 Family history of malignant neoplasm of trachea, bronchus and lung: Secondary | ICD-10-CM

## 2013-08-06 DIAGNOSIS — E878 Other disorders of electrolyte and fluid balance, not elsewhere classified: Secondary | ICD-10-CM | POA: Diagnosis not present

## 2013-08-06 DIAGNOSIS — J189 Pneumonia, unspecified organism: Secondary | ICD-10-CM

## 2013-08-06 DIAGNOSIS — G4710 Hypersomnia, unspecified: Secondary | ICD-10-CM | POA: Diagnosis present

## 2013-08-06 DIAGNOSIS — E785 Hyperlipidemia, unspecified: Secondary | ICD-10-CM | POA: Diagnosis present

## 2013-08-06 DIAGNOSIS — I272 Pulmonary hypertension, unspecified: Secondary | ICD-10-CM | POA: Diagnosis present

## 2013-08-06 DIAGNOSIS — Z8269 Family history of other diseases of the musculoskeletal system and connective tissue: Secondary | ICD-10-CM

## 2013-08-06 DIAGNOSIS — Z9181 History of falling: Secondary | ICD-10-CM

## 2013-08-06 DIAGNOSIS — R569 Unspecified convulsions: Secondary | ICD-10-CM

## 2013-08-06 DIAGNOSIS — Z780 Asymptomatic menopausal state: Secondary | ICD-10-CM

## 2013-08-06 DIAGNOSIS — F410 Panic disorder [episodic paroxysmal anxiety] without agoraphobia: Secondary | ICD-10-CM | POA: Diagnosis present

## 2013-08-06 DIAGNOSIS — Z96659 Presence of unspecified artificial knee joint: Secondary | ICD-10-CM

## 2013-08-06 DIAGNOSIS — M19049 Primary osteoarthritis, unspecified hand: Secondary | ICD-10-CM | POA: Diagnosis present

## 2013-08-06 DIAGNOSIS — I5032 Chronic diastolic (congestive) heart failure: Secondary | ICD-10-CM | POA: Insufficient documentation

## 2013-08-06 DIAGNOSIS — J9819 Other pulmonary collapse: Secondary | ICD-10-CM | POA: Diagnosis present

## 2013-08-06 DIAGNOSIS — R05 Cough: Secondary | ICD-10-CM

## 2013-08-06 DIAGNOSIS — K589 Irritable bowel syndrome without diarrhea: Secondary | ICD-10-CM | POA: Diagnosis present

## 2013-08-06 DIAGNOSIS — K219 Gastro-esophageal reflux disease without esophagitis: Secondary | ICD-10-CM | POA: Diagnosis present

## 2013-08-06 DIAGNOSIS — D62 Acute posthemorrhagic anemia: Secondary | ICD-10-CM

## 2013-08-06 DIAGNOSIS — Z7982 Long term (current) use of aspirin: Secondary | ICD-10-CM

## 2013-08-06 DIAGNOSIS — E876 Hypokalemia: Secondary | ICD-10-CM | POA: Diagnosis not present

## 2013-08-06 DIAGNOSIS — K5730 Diverticulosis of large intestine without perforation or abscess without bleeding: Secondary | ICD-10-CM | POA: Diagnosis present

## 2013-08-06 DIAGNOSIS — M1710 Unilateral primary osteoarthritis, unspecified knee: Secondary | ICD-10-CM

## 2013-08-06 DIAGNOSIS — R4182 Altered mental status, unspecified: Secondary | ICD-10-CM | POA: Diagnosis present

## 2013-08-06 DIAGNOSIS — G4733 Obstructive sleep apnea (adult) (pediatric): Secondary | ICD-10-CM | POA: Diagnosis present

## 2013-08-06 DIAGNOSIS — G40909 Epilepsy, unspecified, not intractable, without status epilepticus: Secondary | ICD-10-CM | POA: Diagnosis present

## 2013-08-06 DIAGNOSIS — F3289 Other specified depressive episodes: Secondary | ICD-10-CM | POA: Diagnosis present

## 2013-08-06 DIAGNOSIS — G43009 Migraine without aura, not intractable, without status migrainosus: Secondary | ICD-10-CM | POA: Diagnosis present

## 2013-08-06 DIAGNOSIS — Z836 Family history of other diseases of the respiratory system: Secondary | ICD-10-CM

## 2013-08-06 DIAGNOSIS — H409 Unspecified glaucoma: Secondary | ICD-10-CM | POA: Diagnosis present

## 2013-08-06 DIAGNOSIS — I10 Essential (primary) hypertension: Secondary | ICD-10-CM | POA: Diagnosis present

## 2013-08-06 DIAGNOSIS — D696 Thrombocytopenia, unspecified: Secondary | ICD-10-CM | POA: Diagnosis present

## 2013-08-06 DIAGNOSIS — Z880 Allergy status to penicillin: Secondary | ICD-10-CM

## 2013-08-06 DIAGNOSIS — J320 Chronic maxillary sinusitis: Secondary | ICD-10-CM | POA: Diagnosis present

## 2013-08-06 DIAGNOSIS — I2789 Other specified pulmonary heart diseases: Secondary | ICD-10-CM | POA: Diagnosis present

## 2013-08-06 DIAGNOSIS — A419 Sepsis, unspecified organism: Secondary | ICD-10-CM | POA: Diagnosis present

## 2013-08-06 DIAGNOSIS — J9620 Acute and chronic respiratory failure, unspecified whether with hypoxia or hypercapnia: Principal | ICD-10-CM | POA: Diagnosis present

## 2013-08-06 DIAGNOSIS — E119 Type 2 diabetes mellitus without complications: Secondary | ICD-10-CM | POA: Diagnosis present

## 2013-08-06 DIAGNOSIS — Z823 Family history of stroke: Secondary | ICD-10-CM

## 2013-08-06 DIAGNOSIS — M549 Dorsalgia, unspecified: Secondary | ICD-10-CM | POA: Diagnosis present

## 2013-08-06 DIAGNOSIS — R296 Repeated falls: Secondary | ICD-10-CM

## 2013-08-06 DIAGNOSIS — Z833 Family history of diabetes mellitus: Secondary | ICD-10-CM

## 2013-08-06 DIAGNOSIS — Z6834 Body mass index (BMI) 34.0-34.9, adult: Secondary | ICD-10-CM

## 2013-08-06 DIAGNOSIS — Z889 Allergy status to unspecified drugs, medicaments and biological substances status: Secondary | ICD-10-CM

## 2013-08-06 DIAGNOSIS — M899 Disorder of bone, unspecified: Secondary | ICD-10-CM | POA: Diagnosis present

## 2013-08-06 DIAGNOSIS — D649 Anemia, unspecified: Secondary | ICD-10-CM | POA: Diagnosis present

## 2013-08-06 DIAGNOSIS — J0100 Acute maxillary sinusitis, unspecified: Secondary | ICD-10-CM | POA: Diagnosis present

## 2013-08-06 DIAGNOSIS — J9600 Acute respiratory failure, unspecified whether with hypoxia or hypercapnia: Secondary | ICD-10-CM | POA: Diagnosis present

## 2013-08-06 DIAGNOSIS — R079 Chest pain, unspecified: Secondary | ICD-10-CM

## 2013-08-06 DIAGNOSIS — Z86718 Personal history of other venous thrombosis and embolism: Secondary | ICD-10-CM

## 2013-08-06 DIAGNOSIS — G9340 Encephalopathy, unspecified: Secondary | ICD-10-CM | POA: Diagnosis present

## 2013-08-06 DIAGNOSIS — R0902 Hypoxemia: Secondary | ICD-10-CM

## 2013-08-06 DIAGNOSIS — J441 Chronic obstructive pulmonary disease with (acute) exacerbation: Secondary | ICD-10-CM | POA: Diagnosis present

## 2013-08-06 DIAGNOSIS — M19079 Primary osteoarthritis, unspecified ankle and foot: Secondary | ICD-10-CM | POA: Diagnosis present

## 2013-08-06 DIAGNOSIS — I509 Heart failure, unspecified: Secondary | ICD-10-CM

## 2013-08-06 DIAGNOSIS — Z79899 Other long term (current) drug therapy: Secondary | ICD-10-CM

## 2013-08-06 DIAGNOSIS — J449 Chronic obstructive pulmonary disease, unspecified: Secondary | ICD-10-CM | POA: Diagnosis present

## 2013-08-06 DIAGNOSIS — I5030 Unspecified diastolic (congestive) heart failure: Secondary | ICD-10-CM | POA: Diagnosis present

## 2013-08-06 LAB — POCT I-STAT 3, ART BLOOD GAS (G3+)
Bicarbonate: 21.7 mEq/L (ref 20.0–24.0)
Bicarbonate: 20.9 mEq/L (ref 20.0–24.0)
O2 Saturation: 84 %
O2 Saturation: 86 %
Patient temperature: 102.3
TCO2: 23 mmol/L (ref 0–100)
TCO2: 22 mmol/L (ref 0–100)
pCO2 arterial: 50.9 mmHg — ABNORMAL HIGH (ref 35.0–45.0)
pH, Arterial: 7.228 — ABNORMAL LOW (ref 7.350–7.450)
pH, Arterial: 7.233 — ABNORMAL LOW (ref 7.350–7.450)
pO2, Arterial: 66 mmHg — ABNORMAL LOW (ref 80.0–100.0)

## 2013-08-06 LAB — COMPREHENSIVE METABOLIC PANEL
ALT: 7 U/L (ref 0–35)
AST: 14 U/L (ref 0–37)
Albumin: 3.2 g/dL — ABNORMAL LOW (ref 3.5–5.2)
Alkaline Phosphatase: 101 U/L (ref 39–117)
CO2: 22 mEq/L (ref 19–32)
Calcium: 8.4 mg/dL (ref 8.4–10.5)
Creatinine, Ser: 0.88 mg/dL (ref 0.50–1.10)
Glucose, Bld: 142 mg/dL — ABNORMAL HIGH (ref 70–99)
Potassium: 3.6 mEq/L (ref 3.5–5.1)
Sodium: 143 mEq/L (ref 135–145)
Total Protein: 7.2 g/dL (ref 6.0–8.3)

## 2013-08-06 LAB — SALICYLATE LEVEL: Salicylate Lvl: <2 mg/dL — ABNORMAL LOW (ref 2.8–20.0)

## 2013-08-06 LAB — INFLUENZA PANEL BY PCR (TYPE A & B): Influenza A By PCR: NEGATIVE

## 2013-08-06 LAB — RAPID URINE DRUG SCREEN, HOSP PERFORMED
Amphetamines: NOT DETECTED
Benzodiazepines: NOT DETECTED
Cocaine: NOT DETECTED
Opiates: NOT DETECTED

## 2013-08-06 LAB — GLUCOSE, CAPILLARY
Glucose-Capillary: 167 mg/dL — ABNORMAL HIGH (ref 70–99)
Glucose-Capillary: 89 mg/dL (ref 70–99)

## 2013-08-06 LAB — URINALYSIS, ROUTINE W REFLEX MICROSCOPIC
Bilirubin Urine: NEGATIVE
Glucose, UA: NEGATIVE mg/dL
Leukocytes, UA: NEGATIVE
Specific Gravity, Urine: 1.022 (ref 1.005–1.030)
pH: 6 (ref 5.0–8.0)

## 2013-08-06 LAB — CBC
HCT: 33.3 % — ABNORMAL LOW (ref 36.0–46.0)
MCH: 31 pg (ref 26.0–34.0)
MCHC: 32.4 g/dL (ref 30.0–36.0)
MCV: 95.7 fL (ref 78.0–100.0)
Platelets: 173 10*3/uL (ref 150–400)
RBC: 3.48 MIL/uL — ABNORMAL LOW (ref 3.87–5.11)
RDW: 15.7 % — ABNORMAL HIGH (ref 11.5–15.5)

## 2013-08-06 LAB — CBC WITH DIFFERENTIAL/PLATELET
Basophils Absolute: 0 10*3/uL (ref 0.0–0.1)
Basophils Relative: 0 % (ref 0–1)
Eosinophils Absolute: 0 10*3/uL (ref 0.0–0.7)
Eosinophils Relative: 0 % (ref 0–5)
Lymphs Abs: 0.7 10*3/uL (ref 0.7–4.0)
MCH: 30.7 pg (ref 26.0–34.0)
MCV: 97.8 fL (ref 78.0–100.0)
Neutrophils Relative %: 88 % — ABNORMAL HIGH (ref 43–77)
Platelets: 176 10*3/uL (ref 150–400)
RBC: 3.71 MIL/uL — ABNORMAL LOW (ref 3.87–5.11)
RDW: 15.9 % — ABNORMAL HIGH (ref 11.5–15.5)

## 2013-08-06 LAB — BASIC METABOLIC PANEL
CO2: 23 mEq/L (ref 19–32)
Calcium: 8.4 mg/dL (ref 8.4–10.5)
Chloride: 111 mEq/L (ref 96–112)
Creatinine, Ser: 0.7 mg/dL (ref 0.50–1.10)
GFR calc non Af Amer: >90 mL/min (ref >90)
Glucose, Bld: 97 mg/dL (ref 70–99)
Sodium: 144 mEq/L (ref 135–145)

## 2013-08-06 LAB — ACETAMINOPHEN LEVEL: Acetaminophen (Tylenol), Serum: <15 ug/mL (ref 10–30)

## 2013-08-06 LAB — ETHANOL: Alcohol, Ethyl (B): <11 mg/dL (ref 0–11)

## 2013-08-06 LAB — URINE MICROSCOPIC-ADD ON

## 2013-08-06 LAB — MRSA PCR SCREENING: MRSA by PCR: NEGATIVE

## 2013-08-06 MED ORDER — ALBUTEROL SULFATE (5 MG/ML) 0.5% IN NEBU
10.0000 mg | INHALATION_SOLUTION | Freq: Once | RESPIRATORY_TRACT | Status: AC
  Administered 2013-08-06: 10 mg via RESPIRATORY_TRACT
  Filled 2013-08-06: qty 2

## 2013-08-06 MED ORDER — IPRATROPIUM BROMIDE 0.02 % IN SOLN
0.5000 mg | RESPIRATORY_TRACT | Status: DC
  Administered 2013-08-06 – 2013-08-08 (×16): 0.5 mg via RESPIRATORY_TRACT
  Filled 2013-08-06 (×16): qty 2.5

## 2013-08-06 MED ORDER — SODIUM CHLORIDE 0.9 % IV SOLN
INTRAVENOUS | Status: DC
  Administered 2013-08-07 – 2013-08-08 (×4): via INTRAVENOUS

## 2013-08-06 MED ORDER — INSULIN ASPART 100 UNIT/ML ~~LOC~~ SOLN
0.0000 [IU] | SUBCUTANEOUS | Status: DC
  Administered 2013-08-06 – 2013-08-08 (×3): 2 [IU] via SUBCUTANEOUS
  Administered 2013-08-08 – 2013-08-09 (×2): 1 [IU] via SUBCUTANEOUS

## 2013-08-06 MED ORDER — METHYLPREDNISOLONE SODIUM SUCC 125 MG IJ SOLR
125.0000 mg | Freq: Once | INTRAMUSCULAR | Status: AC
  Administered 2013-08-06: 125 mg via INTRAVENOUS
  Filled 2013-08-06: qty 2

## 2013-08-06 MED ORDER — KETOROLAC TROMETHAMINE 30 MG/ML IJ SOLN: 30.0000 mg | Freq: Three times a day (TID) | INTRAMUSCULAR | Status: DC | PRN

## 2013-08-06 MED ORDER — ACETAMINOPHEN 325 MG PO TABS
650.0000 mg | ORAL_TABLET | Freq: Once | ORAL | Status: AC
  Administered 2013-08-06: 650 mg via ORAL

## 2013-08-06 MED ORDER — ACETAMINOPHEN 325 MG PO TABS
650.0000 mg | ORAL_TABLET | Freq: Four times a day (QID) | ORAL | Status: DC | PRN
  Administered 2013-08-06 – 2013-08-07 (×3): 650 mg via ORAL
  Filled 2013-08-06 (×3): qty 2

## 2013-08-06 MED ORDER — CLINDAMYCIN PHOSPHATE 600 MG/50ML IV SOLN
600.0000 mg | Freq: Three times a day (TID) | INTRAVENOUS | Status: DC
  Administered 2013-08-06 – 2013-08-08 (×6): 600 mg via INTRAVENOUS
  Filled 2013-08-06 (×8): qty 50

## 2013-08-06 MED ORDER — CLINDAMYCIN PHOSPHATE 600 MG/50ML IV SOLN
600.0000 mg | Freq: Once | INTRAVENOUS | Status: AC
  Administered 2013-08-06: 600 mg via INTRAVENOUS
  Filled 2013-08-06: qty 50

## 2013-08-06 MED ORDER — ALBUTEROL SULFATE (2.5 MG/3ML) 0.083% IN NEBU
2.5000 mg | INHALATION_SOLUTION | RESPIRATORY_TRACT | Status: DC | PRN
  Filled 2013-08-06: qty 0.5

## 2013-08-06 MED ORDER — DEXTROSE 5 % IV SOLN
1.0000 g | Freq: Once | INTRAVENOUS | Status: AC
  Administered 2013-08-06: 1 g via INTRAVENOUS
  Filled 2013-08-06: qty 10

## 2013-08-06 MED ORDER — SODIUM CHLORIDE 0.9 % IV BOLUS (SEPSIS)
1000.0000 mL | Freq: Once | INTRAVENOUS | Status: AC
  Administered 2013-08-06: 1000 mL via INTRAVENOUS

## 2013-08-06 MED ORDER — ACETAMINOPHEN 325 MG PO TABS: 650.0000 mg | ORAL_TABLET | Freq: Four times a day (QID) | ORAL | Status: DC | PRN

## 2013-08-06 MED ORDER — IOHEXOL 350 MG/ML SOLN
80.0000 mL | Freq: Once | INTRAVENOUS | Status: AC | PRN
  Administered 2013-08-06: 80 mL via INTRAVENOUS

## 2013-08-06 MED ORDER — KETOROLAC TROMETHAMINE 30 MG/ML IJ SOLN
30.0000 mg | Freq: Once | INTRAMUSCULAR | Status: AC
  Administered 2013-08-06: 30 mg via INTRAVENOUS
  Filled 2013-08-06: qty 1

## 2013-08-06 MED ORDER — ALBUTEROL SULFATE (2.5 MG/3ML) 0.083% IN NEBU
2.5000 mg | INHALATION_SOLUTION | RESPIRATORY_TRACT | Status: DC
  Administered 2013-08-06 – 2013-08-08 (×15): 2.5 mg via RESPIRATORY_TRACT
  Filled 2013-08-06 (×3): qty 0.5
  Filled 2013-08-06: qty 3
  Filled 2013-08-06 (×14): qty 0.5

## 2013-08-06 MED ORDER — WHITE PETROLATUM GEL
Status: AC
  Administered 2013-08-06: 0.2
  Filled 2013-08-06: qty 5

## 2013-08-06 MED ORDER — DEXTROSE 5 % IV SOLN
1.0000 g | INTRAVENOUS | Status: DC
  Administered 2013-08-07 – 2013-08-08 (×2): 1 g via INTRAVENOUS
  Filled 2013-08-06 (×2): qty 10

## 2013-08-06 MED ORDER — IPRATROPIUM BROMIDE 0.02 % IN SOLN
0.5000 mg | Freq: Once | RESPIRATORY_TRACT | Status: AC
  Administered 2013-08-06: 0.5 mg via RESPIRATORY_TRACT
  Filled 2013-08-06: qty 2.5

## 2013-08-06 MED ORDER — ENOXAPARIN SODIUM 40 MG/0.4ML ~~LOC~~ SOLN
40.0000 mg | Freq: Every day | SUBCUTANEOUS | Status: DC
  Administered 2013-08-06: 40 mg via SUBCUTANEOUS
  Filled 2013-08-06 (×2): qty 0.4

## 2013-08-06 NOTE — H&P (Signed)
Family Medicine Teaching [facility] Admission History and Physical Service Pager: [phone]  Patient name: [name] Medical record number: [id number] Date of birth: 11/16/1955 Age: 57 y.o. Gender: female  Primary Care Provider: Levert Feinstein, MD Consultants: none Code Status: presumed full, will need to address with family when they return or patient when she wakes up   Chief Complaint: AMS  Assessment and Plan: NAIA RUFF is a 57 y.o. female presenting with altered mental status and acute respiratory failure  following 3 days of cough and SOB. PMH is significant for COPD, OSA, DM, HTN, HLD, epilepsy, depression, and remote DVT.  # AMS/sepsis/acute respiratory failure: Likely secondary to CAP/COPD exacerbation given history and fever, CXR ambiguous with bibasilar atelectasis/infiltrate. PE also possible given tachycardia but no evidence of DVT, Wells score 3 (4.5 if previous DVT was objectively diagnosed, however it was not). Recent history of falls per chart review so intracranial bleed also being considered. - admit to step down on bipap, Dr. Randolm Idol attending - Consider CTA chest - Obtain head CT - s/p rocephin and clinda in ED, will likely continue these given multiple drug allergies and recent levaquin course. - continue methylpred - NPO until alert and off bipap, holding all home meds - check TSH, mag, phos, lipids, A1C, pro-bnp - cycle troponins and repeat EKG later this am - bolus 1L NS then 114mL/hr - blood cultures pending, obtain sputum cx and urine legionella and strep  # DM: Last A1C 5.5 not on any meds  - will get q4 CBGs and sensitive SSI while on steroids  # HTN: normotensive to low with sepsis - holding all home meds - initiate IV antihypertensives if BP rises and persists >160  # Seizure disorder: treated with lamictal at home, sees neurology without mention of seizures in recent past - holding po meds - consider keppra load  FEN/GI: NPO, NS @  100 Prophylaxis: heparin SQ and PPI  Disposition: Admit to step down, out pending transition to [place], clinical improvement   History of Present Illness: [name] is a 57 y.o. female presenting with 3 days of productive cough, increased work of breathing and wheezing. She developed a fever to 102.3, two days ago and has had decreased po intake since that time. She has been hypersomnolent and difficult to arouse for the past 24 hours per her husband's report. Level 5 caveat applies as patient was unresponsive on our exam and the history was obtained through chart review.   In the ED she received ceftriaxone and clindamycin for a likely CAP and was placed on bipap for hypoxemia to 83% and acute respiratory failure.   Review Of Systems: unable to be obtained 2/2 AMS  Patient Active Problem List   Diagnosis Date Noted  . Acute respiratory failure 08/06/2013  . Pulmonary hypertension 08/06/2013  . Diastolic CHF 08/06/2013  . Frequent falls 08/02/2013  . COPD exacerbation 03/29/2013  . BPPV (benign paroxysmal positional vertigo) 03/09/2013  . NSAID long-term use 02/15/2013  . Neck pain on left side 12/29/2012  . Cough 12/29/2012  . Chronic respiratory failure 11/01/2012  . URI (upper respiratory infection) 10/22/2012  . Urinary hesitancy 10/04/2012  . Chest pain 10/04/2012  . Dysuria 09/14/2012  . Constipation 09/14/2012  . Anemia due to acute blood loss 09/04/2012  . Hypoxemia 09/03/2012  . Hx of bacterial pneumonia 08/26/2012  . 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 Hemorrhoid 08/12/2012  . Left shoulder pain 06/27/2012  . Headache(784.0) 05/09/2012  . Edema  02/04/2012  . POSTMENOPAUSAL STATUS 06/04/2010  . ANXIETY 02/13/2010  . PANIC ATTACK 10/31/2009  . SLEEP APNEA, OBSTRUCTIVE, MODERATE 02/19/2009  . SINUSITIS, CHRONIC 08/17/2008  . Unspecified vitamin D deficiency 01/21/2008  . OSTEOPENIA 11/24/2007  . Irritable bowel syndrome 11/17/2007  . ALLERGIC RHINITIS 09/24/2007  . COPD  GOLD 0 05/12/2007  . DIABETES MELLITUS II, UNCOMPLICATED 10/08/2006  . HYPERLIPIDEMIA 10/08/2006  . OBESITY, NOS  BMI 33.8 10/08/2006  . Smoker 10/08/2006  . MIGRAINE, COMMON, INTRACTABLE 10/08/2006  . GLAUCOMA 10/08/2006  . GASTROESOPHAGEAL REFLUX, NO ESOPHAGITIS 10/08/2006  . HERNIA, HIATAL, NONCONGENITAL 10/08/2006  . INCONTINENCE, STRESS, FEMALE 10/08/2006  . DJD (degenerative joint disease) of knee 10/08/2006  . CONVULSIONS, SEIZURES, NOS 10/08/2006   Past Medical History: Past Medical History  Diagnosis Date  . Allergic rhinitis   . Hypertension   . Hyperlipidemia   . GERD (gastroesophageal reflux disease)   . Glaucoma   . Neck pain     Cervical disc  . IBS (irritable bowel syndrome)   . Esophageal stricture   . Diverticulosis   . Right leg DVT 1982    groin  . COPD (chronic obstructive pulmonary disease)   . Emphysema   . Pneumonia 2012; 04/27/2012  . Chronic bronchitis     "keep it all the time" (04/27/2012)  . Exertional dyspnea   . Type II diabetes mellitus     No meds since weight loss  . Migraine   . Epilepsy   . DJD (degenerative joint disease)   . Arthritis     "hands and legs and feet" (04/27/2012)  . Depression   . OSA on CPAP     6-7 yrs  . UTI (urinary tract infection)     2 weeks ago  . Cancer 05    rt arm mole rem cancer   Past Surgical History: Past Surgical History  Procedure Laterality Date  . Tubal ligation  2001  . Knee arthroscopy  1990's    left  . Carpal tunnel release  ~ 2008    right  . Shoulder open rotator cuff repair  ~ 2010    left  . Abdominal hysterectomy  2001    "partial"  . 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 Total knee arthroplasty  08/23/2012    Procedure: TOTAL KNEE ARTHROPLASTY;  Surgeon: Nilda Simmer, MD;  Location: [facility] OR;  Service: Orthopedics;  Laterality: Right;  RIGHT KNEE ARTHROPLASTY MEDIAL AND LATERAL COMPARTMENTS WITH PATELLA RESURFACING   Social History: History  Substance Use Topics  .  Smoking status: Current Every Day Smoker -- 1.00 packs/day for 36 years    Types: Cigarettes    Start date: 08/12/1975  . Smokeless tobacco: Former Neurosurgeon    Quit date: 08/23/2012     Comment: started back smoking in 11/2012  . Alcohol Use: No     Comment: drinks 2 sodas a day   Please also refer to relevant sections of EMR.  Family History: Family History  Problem Relation Age of Onset  . Heart disease Maternal Grandfather   . Diabetes Maternal Grandfather   . Heart disease      uncle  . Breast cancer Maternal Aunt   . Alcohol abuse Father   . Lung cancer Father     was a smoker  . Emphysema Father     was a smoker  . Bipolar disorder Son   . Muscular dystrophy Son   . Diabetes  uncles/aunts  . Heart disease Sister   . Colon cancer Neg Hx   . Lung cancer Mother     was a smoker  . Stroke Mother   . Other Sister     blood clotting disorder  . Heart disease Son    Allergies and Medications: Allergies  Allergen Reactions  . Amoxicillin Anaphylaxis  . Azithromycin Swelling and Rash    "swelling all over"  . Doxycycline Hyclate Anaphylaxis and Rash  . Penicillins Shortness Of Breath and Rash    "almost died"  . Sulfonamide Derivatives Rash  . Chantix [Varenicline] Other (See Comments)    "bad dreams"  . Clarithromycin Hives  . Fluticasone-Salmeterol Other (See Comments)    REACTION: ulcers in mouth  . Nortriptyline Other (See Comments)    Per patient had "kidney problems" on this medication   No current facility-administered medications on file prior to encounter.   Current Outpatient Prescriptions on File Prior to Encounter  Medication Sig Dispense Refill  . albuterol (PROAIR HFA) 108 (90 BASE) MCG/ACT inhaler Inhale 2 puffs into the lungs every 6 (six) hours as needed. Shortness of breath  1 Inhaler  3  . albuterol (PROVENTIL) (2.5 MG/3ML) 0.083% nebulizer solution Take 2.5 mg by nebulization every 6 (six) hours as needed. For shortness of breath      .  aspirin EC 81 MG tablet Take 1 tablet (81 mg total) by mouth daily.      . [employer] VITAMIN D 2000 UNITS CAPS TAKE ONE CAPSULE BY MOUTH EVERY DAY  30 capsule  1  . ferrous sulfate 325 (65 FE) MG tablet Take 1 tablet (325 mg total) by mouth 2 (two) times daily with a meal.  60 tablet  3  . fluticasone (FLONASE) 50 MCG/ACT nasal spray Place 2 sprays into the nose daily as needed. For congestion.  16 g  1  . lamoTRIgine (LAMICTAL) 150 MG tablet Take 1 tablet (150 mg total) by mouth 2 (two) times daily.  60 tablet  2  . Loratadine 10 MG CAPS Take 1 capsule (10 mg total) by mouth daily.  30 each  5  . omeprazole (PRILOSEC) 40 MG capsule Take 1 capsule (40 mg total) by mouth 2 (two) times daily.  60 capsule  5  . simvastatin (ZOCOR) 40 MG tablet Take 1 tablet (40 mg total) by mouth at bedtime.  30 tablet  3  . tiotropium (SPIRIVA) 18 MCG inhalation capsule Place 1 capsule (18 mcg total) into inhaler and inhale daily.  30 capsule  11  . topiramate (TOPAMAX) 100 MG tablet 1tab in the am, 2 at bedtime  270 tablet  3  . acetaminophen (TYLENOL) 325 MG tablet Take 2 tablets (650 mg total) by mouth every 6 (six) hours as needed for pain or fever (greater than 101). For pain      . Blood Glucose Monitoring Suppl (BLOOD GLUCOSE METER) kit Use to check blood glucose daily.  Accuchek preffered by patient  1 each  0  . Glucose Blood (BLOOD GLUCOSE TEST STRIPS) STRP Use to test blood glucose daily.  100 each  12  . levofloxacin (LEVAQUIN) 500 MG tablet Take 1 tablet (500 mg total) by mouth daily.  7 tablet  0    Objective: BP 113/73  Pulse 129  Temp(Src) 102.9 F (39.4 C) (Oral)  Resp 24  SpO2 100% Exam: General: obese woman, lying in bed on bipap, does not respond to voice, coughs intermittently throughout exam HEENT: dry MM, NCAT,  PERRL Cardiovascular: RRR, distant heart sounds, 2+ dp pulses Respiratory: expiratory wheezing, diminished breath sounds throughout, difficult to assess over bipap noises Abdomen:  soft, nondistended Extremities: WWP, no LE edema Skin: pale, no rashes Neuro: unresponsive to voice and moderate sternal rub, withdraws to painful stimuli  Labs and Imaging: CBC BMET   Recent Labs Lab 08/06/13 0216  WBC 13.4*  HGB 11.4*  HCT 36.3  PLT 176    Recent Labs Lab 08/06/13 0216  NA 143  K 3.6  CL 108  CO2 22  BUN 17  CREATININE 0.88  GLUCOSE 142*  CALCIUM 8.4     ABG    Component Value Date/Time   PHART 7.228* 08/06/2013 0303   PCO2ART 53.4* 08/06/2013 0303   PO2ART 66.0* 08/06/2013 0303   HCO3 21.7 08/06/2013 0303   TCO2 23 08/06/2013 0303   ACIDBASEDEF 6.0* 08/06/2013 0303   O2SAT 84.0 08/06/2013 0303  CXR 12/17: Suspect developing left lower lobe infiltrate. CXR 12/27: Minimal bibasilar airspace opacities may reflect atelectasis or possibly mild pneumonia. EKG: Sinus tachycardia, low voltage  Beverely Low, MD 08/06/2013, 4:29 AM PGY-1,  Family Medicine FPTS Intern pager: [phone], text pages welcome   I have seen and evaluated the patient with Dr. Richarda Blade and I agree with her documentation as detailed above. My annotations are in [place].   Murtis Sink, MD [facility] Family Medicine Resident, PGY-2 08/06/2013, 5:44 AM

## 2013-08-06 NOTE — ED Notes (Signed)
i called to give report.  rn not available

## 2013-08-06 NOTE — Progress Notes (Signed)
Update note from discussion with family  I talked to her husband, doreatha offer and her daughter in Social worker. Her husband states that he and her son found her down in the bathroom around midnight last night. She was unresponsive and they pulled her out of the bathroom. EMS was called and couldn't wake her up either. They say she has not felt well for 2 days. She worsened 1 day ago with fatigue, dyspnea, cough, and a subjective fever. Her husband states that  she spent the day laying around in bed. On 12/24 she was in her normal state of health but had contact with a 86 year old child who had the flu and a fever of 102.   They state that she has had talks about her code status with other family members but aren't sure of her wishes,. They would like full code for now.   On a brief exam she is able to open her eyes and respond to verbal commands now but still very somnolent and only makes minimal movements.   She remains tachycardic and her d-dimer is elevated at 0.79. We will go ahead and evaluate for PE with a CT angio of her chest.    Murtis Sink, MD [facility] Family Medicine Resident, PGY-2 08/06/2013, 11:27 AM

## 2013-08-06 NOTE — ED Notes (Signed)
Family practice at bedside.  Pt responding appropriately to MD requests.

## 2013-08-06 NOTE — H&P (Signed)
FMTS Attending Note  I personally saw and evaluated the patient. The plan of care was discussed with the resident team. I agree with the assessment and plan as documented by the resident.   57 year old female with past medical history of OSA, COPD, obesity, hypertension, hyperlipidemia presented to the emergency room in acute respiratory failure with hypercarbia. Evaluation in the emergency room identified likely bilateral pneumonia. Patient was initiated on BiPAP therapy. She was treated with antibiotics including clindamycin and Rocephin. She was also empirically started on Solu-Medrol for possible overlying component of COPD exacerbation. Please refer to resident note for history of present illness. Patient is more alert this morning. She is able to answer her name and knows that she is in the hospital. She is able to follow basic commands.  Vitals: Reviewed General: Caucasian female, currently on BiPAP, arousable HEENT: Normocephalic, pupils are equal round and reactive to light, no scleral icterus, BiPAP mask overlying face, dry mucous membranes Cardiac: Regular rate and rhythm, S1 and S2 present, no murmurs, no heaves or thrills Respiratory: Bilateral rhonchi on anterior auscultation, currently on BiPAP Abdomen: Soft, nontender, bowel sounds present Extremities: No edema, 2+ radial pulses, 2 posterior cells pedis pulses, no calf tenderness, no palpable cords in the bilateral calves Skin: No rash Neuro: Cranial nerves II through XII appear intact, strength was 5 of 5 in all extremities, gross sensation to light touch was intact, further neuro testing not performed due to patient somnolence.  Reviewed imaging and lab work  57 year old female admitted with acute on chronic respiratory failure with hypercarbia and encephalopathy likely secondary to her acute respiratory failure.  #1. Acute respiratory failure- continue BiPAP, continue empiric treatment for suspected pneumonia with clindamycin  and Rocephin, agree with Solu-Medrol to treat suspected COPD exacerbation, will obtain d-dimer to rule out acute PE. If elevated will need to order CT angiogram of the chest. #2. Hypertension-I agree with holding home blood pressure medications at this time due to hypotension #3. Sepsis due to suspected pneumonia-agree with blood cultures, continue empiric antibiotics #4. History of seizure disorder-will restart home antiepileptic medications once tolerating by mouth #5. Diabetes-agree with the resident management as documented  Donnella Sham M.D.

## 2013-08-06 NOTE — ED Notes (Signed)
i attempted to call report was told by a female rn that they were just about to start report and could not take this pt until after 0730.  2nd attempt to call report unsuccessfully.

## 2013-08-06 NOTE — ED Notes (Signed)
Pt arrived by gems from home.  Sob.  Unable to get much info from the pt.  hhn on arrival by ems albuterol and atrovent

## 2013-08-06 NOTE — ED Notes (Signed)
Pt placed back on bi-pap by rt.  Pt responds to vigorous stimuli by withdrawing no attempt to speak since her arrival earlier tonight

## 2013-08-06 NOTE — Progress Notes (Signed)
Interim Progress Note  S: pt off bipap for > 4 hours, lying in bed, complaining of back pain secondary to two falls on her back this week, she was given toradol 30 mg IV x 1 for this pain earlier without relief, pain in thoracic spine ; she denies shortness of breath or cough; the last thing the patient remembers prior to coming to the hospital is walking to the bathroom where she was found unresponsive by her son   O: BP 103/51  Pulse 95  Temp(Src) 98.6 F (37 C) (Oral)  Resp 20  Ht 5' (1.524 m)  Wt 176 lb 2.4 oz (79.9 kg)  BMI 34.40 kg/m2  SpO2 97% Gen: well appearing, lying in bed, conversant Lungs: normal WOB, CTA-B Back: no bony abnormality, mild tenderness to palpation along thoracic vertebrae  Extremities: no edema or tenderness, atrophied for a woman her age Neuro: alert, oriented x 4, memory intact, no focal deficits   A/P: Resolving encephalopathy without evidence of acidosis in PM BMP.  - Cont on nasal cannula overnight - Remain in step down given need for BiPap today  Si Raider. Clinton Sawyer, MD, MBA 08/06/2013, 8:38 PM Family Medicine Resident, PGY-3 [phone] pager

## 2013-08-06 NOTE — ED Provider Notes (Addendum)
CSN: [id number]     Arrival date & time 08/06/13  0143 History   First MD Initiated Contact with Patient 08/06/13 0203     Chief Complaint  Patient presents with  . Shortness of Breath   (Consider location/radiation/quality/duration/timing/severity/associated sxs/prior Treatment) HPI Patient is a 57 yo woman with non oxygen dependent COPD along with multiple co morbidities (see PMH).   She is BIB EMS from her home at the request of her husband. She has had 3 days of a productive cough with increased work of breathing and wheezing. She has had a fever for the past 2 days with Tm of 102.41F orally. Her po intake has been diminished and she has been hypersomnolent and difficulty to arouse for the past 24 hrs. The patient's husband and her son were unable to get her to stand up to get her to the bathroom.    I am unable to obtain a history from the patient due to degree of MS changes.  Husband says that she completed a course of Levaquin 2 to 3 days ago but, it did not seem to help her symptoms.   Patient has not had her regular meds for about 36 hrs.   Past Medical History  Diagnosis Date  . Allergic rhinitis   . Hypertension   . Hyperlipidemia   . GERD (gastroesophageal reflux disease)   . Glaucoma   . Neck pain     Cervical disc  . IBS (irritable bowel syndrome)   . Esophageal stricture   . Diverticulosis   . Right leg DVT 1982    groin  . COPD (chronic obstructive pulmonary disease)   . Emphysema   . Pneumonia 2012; 04/27/2012  . Chronic bronchitis     "keep it all the time" (04/27/2012)  . Exertional dyspnea   . Type II diabetes mellitus     No meds since weight loss  . Migraine   . Epilepsy   . DJD (degenerative joint disease)   . Arthritis     "hands and legs and feet" (04/27/2012)  . Depression   . OSA on CPAP     6-7 yrs  . UTI (urinary tract infection)     2 weeks ago  . Cancer 05    rt arm mole rem cancer   Past Surgical History  Procedure Laterality Date  .  Tubal ligation  2001  . Knee arthroscopy  1990's    left  . Carpal tunnel release  ~ 2008    right  . Shoulder open rotator cuff repair  ~ 2010    left  . Abdominal hysterectomy  2001    "partial"  . 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 Total knee arthroplasty  08/23/2012    Procedure: TOTAL KNEE ARTHROPLASTY;  Surgeon: Nilda Simmer, MD;  Location: [facility] OR;  Service: Orthopedics;  Laterality: Right;  RIGHT KNEE ARTHROPLASTY MEDIAL AND LATERAL COMPARTMENTS WITH PATELLA RESURFACING   Family History  Problem Relation Age of Onset  . Heart disease Maternal Grandfather   . Diabetes Maternal Grandfather   . Heart disease      uncle  . Breast cancer Maternal Aunt   . Alcohol abuse Father   . Lung cancer Father     was a smoker  . Emphysema Father     was a smoker  . Bipolar disorder Son   . Muscular dystrophy Son   . Diabetes      uncles/aunts  . Heart  disease Sister   . Colon cancer Neg Hx   . Lung cancer Mother     was a smoker  . Stroke Mother   . Other Sister     blood clotting disorder  . Heart disease Son    History  Substance Use Topics  . Smoking status: Current Every Day Smoker -- 1.00 packs/day for 36 years    Types: Cigarettes    Start date: 08/12/1975  . Smokeless tobacco: Former Neurosurgeon    Quit date: 08/23/2012     Comment: started back smoking in 11/2012  . Alcohol Use: No     Comment: drinks 2 sodas a day   OB History   Grav Para Term Preterm Abortions TAB SAB Ect Mult Living                 Review of Systems Ten point review of symptoms performed and is negative with the exception of symptoms noted above.  Allergies  Amoxicillin; Azithromycin; Doxycycline hyclate; Penicillins; Sulfonamide derivatives; Chantix; Clarithromycin; Fluticasone-salmeterol; and Nortriptyline  Home Medications   Current Outpatient Rx  Name  Route  Sig  Dispense  Refill  . albuterol (PROAIR HFA) 108 (90 BASE) MCG/ACT inhaler   Inhalation   Inhale 2 puffs into the  lungs every 6 (six) hours as needed. Shortness of breath   1 Inhaler   3   . albuterol (PROVENTIL) (2.5 MG/3ML) 0.083% nebulizer solution   Nebulization   Take 2.5 mg by nebulization every 6 (six) hours as needed. For shortness of breath         . aspirin EC 81 MG tablet   Oral   Take 1 tablet (81 mg total) by mouth daily.         . [employer] VITAMIN D 2000 UNITS CAPS      TAKE ONE CAPSULE BY MOUTH EVERY DAY   30 capsule   1   . ferrous sulfate 325 (65 FE) MG tablet   Oral   Take 1 tablet (325 mg total) by mouth 2 (two) times daily with a meal.   60 tablet   3   . fluticasone (FLONASE) 50 MCG/ACT nasal spray   Nasal   Place 2 sprays into the nose daily as needed. For congestion.   16 g   1   . lamoTRIgine (LAMICTAL) 150 MG tablet      Take 1 tablet (150 mg total) by mouth 2 (two) times daily.   60 tablet   2     Refill [id number]   . Loratadine 10 MG CAPS   Oral   Take 1 capsule (10 mg total) by mouth daily.   30 each   5   . nitrofurantoin, macrocrystal-monohydrate, (MACROBID) 100 MG capsule   Oral   Take 100 mg by mouth daily.         Marland Kitchen omeprazole (PRILOSEC) 40 MG capsule   Oral   Take 1 capsule (40 mg total) by mouth 2 (two) times daily.   60 capsule   5   . simvastatin (ZOCOR) 40 MG tablet   Oral   Take 1 tablet (40 mg total) by mouth at bedtime.   30 tablet   3     Refill [id number]   . tiotropium (SPIRIVA) 18 MCG inhalation capsule   Inhalation   Place 1 capsule (18 mcg total) into inhaler and inhale daily.   30 capsule   11   . topiramate (TOPAMAX) 100 MG tablet  1tab in the am, 2 at bedtime   270 tablet   3   . varenicline (CHANTIX) 1 MG tablet   Oral   Take 1 mg by mouth daily.         Marland Kitchen acetaminophen (TYLENOL) 325 MG tablet   Oral   Take 2 tablets (650 mg total) by mouth every 6 (six) hours as needed for pain or fever (greater than 101). For pain         . Blood Glucose Monitoring Suppl (BLOOD GLUCOSE METER) kit      Use  to check blood glucose daily.  Accuchek preffered by patient   1 each   0   . Glucose Blood (BLOOD GLUCOSE TEST STRIPS) STRP      Use to test blood glucose daily.   100 each   12   . levofloxacin (LEVAQUIN) 500 MG tablet   Oral   Take 1 tablet (500 mg total) by mouth daily.   7 tablet   0    BP 115/62  Pulse 128  Temp(Src) 102.9 F (39.4 C) (Oral)  Resp 22  SpO2 99% Physical Exam Gen: well developed and well nourished appearing, acutely ill appearing Head: NCAT Eyes: PERL, EOMI Nose: no epistaixis or rhinorrhea Mouth/throat: mucosa is mildly dehydrated appearing and pink Neck: supple, no stridor Lungs: BS diminished bilaterally,, scattered wheezing, rhonchi or rales, RR 28/min CV: rapid and regular with palpable distal pulses.  Abd: soft, notender, nondistended Back: no ttp, no cva ttp Skin: no rashese, wnl Neuro: CN ii-xii grossly intact, no focal deficits Psyche; normal affect,  calm and cooperative.   ED Course  Procedures (including critical care time) Labs Re Results for orders placed during the hospital encounter of 08/06/13 (from the past 24 hour(s))  CBC WITH DIFFERENTIAL     Status: Abnormal   Collection Time    08/06/13  2:16 AM      Result Value Range   WBC 13.4 (*) 4.0 - 10.5 K/uL   RBC 3.71 (*) 3.87 - 5.11 MIL/uL   Hemoglobin 11.4 (*) 12.0 - 15.0 g/dL   HCT 16.1  09.6 - 04.5 %   MCV 97.8  78.0 - 100.0 fL   MCH 30.7  26.0 - 34.0 pg   MCHC 31.4  30.0 - 36.0 g/dL   RDW 40.9 (*) 81.1 - 91.4 %   Platelets 176  150 - 400 K/uL   Neutrophils Relative % 88 (*) 43 - 77 %   Neutro Abs 11.8 (*) 1.7 - 7.7 K/uL   Lymphocytes Relative 5 (*) 12 - 46 %   Lymphs Abs 0.7  0.7 - 4.0 K/uL   Monocytes Relative 7  3 - 12 %   Monocytes Absolute 0.9  0.1 - 1.0 K/uL   Eosinophils Relative 0  0 - 5 %   Eosinophils Absolute 0.0  0.0 - 0.7 K/uL   Basophils Relative 0  0 - 1 %   Basophils Absolute 0.0  0.0 - 0.1 K/uL     Imaging Review Dg Chest Portable 1  View  08/06/2013   CLINICAL DATA:  Shortness of breath.  EXAM: PORTABLE CHEST - 1 VIEW  COMPARISON:  Chest radiograph performed 07/27/2013  FINDINGS: The lungs are well-aerated. Pulmonary vascularity is at the upper limits of normal. Minimal bibasilar opacities may reflect atelectasis or possibly mild pneumonia. There is no evidence of pleural effusion or pneumothorax.  The cardiomediastinal silhouette is borderline normal in size. No acute osseous abnormalities are seen.  IMPRESSION: Minimal bibasilar airspace opacities may reflect atelectasis or possibly mild pneumonia.   Electronically Signed   By: Roanna Raider M.D.   On: 08/06/2013 02:45   EKG: sinus tach, normal qrs, normal axis, normal intervals, no ST - T wave abnormalities.    MDM  Patient with CAP and acute copd exacerbation with clinical C02 narcosis. We are intiating tx with BiPAP and will re-evaluate mental status. ABG pending. We will tx empirically with Zosyn as the patient just completed a course of Levaquin a couple of days ago.  Will obtain influenza PCR. Plan to admit to SDU if good response to BiPAP.   CRITICAL CARE Performed by: Brandt Loosen   Total critical care time: 69m  Critical care time was exclusive of separately billable procedures and treating other patients.  Critical care was necessary to treat or prevent imminent or life-threatening deterioration.  Critical care was time spent personally by me on the following activities: development of treatment plan with patient and/or surrogate as well as nursing, discussions with consultants, evaluation of patient's response to treatment, examination of patient, obtaining history from patient or surrogate, ordering and performing treatments and interventions, ordering and review of laboratory studies, ordering and review of radiographic studies, pulse oximetry and re-evaluation of patient's condition.  Case discussed with FP resident on call who will see and admit the  patient to the SDU.      Brandt Loosen, MD 08/06/13 1610  Brandt Loosen, MD 08/27/13 [phone]

## 2013-08-06 NOTE — ED Notes (Signed)
The pt has been ill for 3-4 days according to her husband.  She is a smoker and still smokes.  She has taken no tylenol today.  Med given

## 2013-08-06 NOTE — Progress Notes (Signed)
Interim Progress Note  S: pt with increased alertness, still with BiPap on, asking to have water , complaining of back pain  O: BP 109/58  Pulse 80  Temp(Src) 98.6 F (37 C) (Oral)  Resp 14  Ht 5' (1.524 m)  Wt 176 lb 2.4 oz (79.9 kg)  BMI 34.40 kg/m2  SpO2 100% Gen: non distressed, awake, interactive Pulm: decreased resp throughout with mild wheezing but still moving air  A/P: pt who presented with AMS/encephalopathy of undetermined origin, with possible hypercarbia component; all other secondary causes of encephalopathy and acidemia are normal - AMS improving so trial off BiPap - Set fluids to NS @ 75 ml/hr - Lovenox for dvt ppx  Si Raider. Clinton Sawyer, MD, MBA 08/06/2013, 3:05 PM Family Medicine Resident, PGY-3 [phone] pager

## 2013-08-06 NOTE — ED Notes (Signed)
Pt keeping her eyes closed and hyperventilating

## 2013-08-07 ENCOUNTER — Inpatient Hospital Stay (HOSPITAL_COMMUNITY): Payer: Medicaid Other

## 2013-08-07 DIAGNOSIS — D62 Acute posthemorrhagic anemia: Secondary | ICD-10-CM

## 2013-08-07 DIAGNOSIS — R079 Chest pain, unspecified: Secondary | ICD-10-CM

## 2013-08-07 DIAGNOSIS — M1710 Unilateral primary osteoarthritis, unspecified knee: Secondary | ICD-10-CM

## 2013-08-07 LAB — CBC
MCH: 30.8 pg (ref 26.0–34.0)
MCHC: 31.4 g/dL (ref 30.0–36.0)
MCV: 98.1 fL (ref 78.0–100.0)
Platelets: 148 10*3/uL — ABNORMAL LOW (ref 150–400)
RBC: 3.18 MIL/uL — ABNORMAL LOW (ref 3.87–5.11)
RDW: 16 % — ABNORMAL HIGH (ref 11.5–15.5)
WBC: 8.7 10*3/uL (ref 4.0–10.5)

## 2013-08-07 LAB — TSH: TSH: 0.719 u[IU]/mL (ref 0.350–4.500)

## 2013-08-07 LAB — BASIC METABOLIC PANEL
BUN: 18 mg/dL (ref 6–23)
CO2: 24 mEq/L (ref 19–32)
Calcium: 8 mg/dL — ABNORMAL LOW (ref 8.4–10.5)
Creatinine, Ser: 0.74 mg/dL (ref 0.50–1.10)
GFR calc Af Amer: >90 mL/min (ref >90)
GFR calc non Af Amer: >90 mL/min (ref >90)

## 2013-08-07 LAB — GLUCOSE, CAPILLARY
Glucose-Capillary: 116 mg/dL — ABNORMAL HIGH (ref 70–99)
Glucose-Capillary: 90 mg/dL (ref 70–99)
Glucose-Capillary: 84 mg/dL (ref 70–99)

## 2013-08-07 LAB — TROPONIN I: Troponin I: <0.3 ng/mL (ref <0.30)

## 2013-08-07 LAB — STREP PNEUMONIAE URINARY ANTIGEN: Strep Pneumo Urinary Antigen: NEGATIVE

## 2013-08-07 MED ORDER — TRAMADOL HCL 50 MG PO TABS
50.0000 mg | ORAL_TABLET | Freq: Two times a day (BID) | ORAL | Status: DC | PRN
  Administered 2013-08-07: 50 mg via ORAL
  Filled 2013-08-07: qty 1

## 2013-08-07 MED ORDER — SODIUM CHLORIDE 0.9 % IV SOLN
1000.0000 mL | Freq: Once | INTRAVENOUS | Status: AC
  Administered 2013-08-07: 1000 mL via INTRAVENOUS

## 2013-08-07 MED ORDER — LAMOTRIGINE 150 MG PO TABS
150.0000 mg | ORAL_TABLET | Freq: Two times a day (BID) | ORAL | Status: DC
  Administered 2013-08-07 – 2013-08-09 (×5): 150 mg via ORAL
  Filled 2013-08-07 (×7): qty 1

## 2013-08-07 MED ORDER — TOPIRAMATE 100 MG PO TABS
100.0000 mg | ORAL_TABLET | Freq: Two times a day (BID) | ORAL | Status: DC
  Administered 2013-08-07 – 2013-08-08 (×2): 100 mg via ORAL
  Filled 2013-08-07 (×3): qty 1

## 2013-08-07 MED ORDER — PANTOPRAZOLE SODIUM 40 MG PO TBEC
40.0000 mg | DELAYED_RELEASE_TABLET | Freq: Every day | ORAL | Status: DC
  Administered 2013-08-07 – 2013-08-08 (×2): 40 mg via ORAL
  Filled 2013-08-07 (×2): qty 1

## 2013-08-07 NOTE — Progress Notes (Signed)
FMTS Attending Daily Note: Sara Neal MD [phone] pager office [phone] I  have seen and examined this patient, reviewed their chart. I have discussed this patient with the resident. I agree with the resident's findings, assessment and care plan. 

## 2013-08-07 NOTE — Progress Notes (Signed)
Family Medicine Teaching Service Daily Progress Note Intern Pager: [phone]  Patient name: [name] Medical record number: [id number] Date of birth: 01-05-1956 Age: 57 y.o. Gender: female  Primary Care Provider: Levert Feinstein, MD Consultants: none Code Status: full.  Pt Overview and Major Events to Date:  12/27 on bipap 12/28 on O2 per [place]  Assessment and Plan: YEE GANGI is a 57 y.o. female presenting with altered mental status and acute respiratory failure following 3 days of cough and SOB. PMH is significant for COPD, OSA, DM, HTN, HLD, epilepsy, depression, and remote DVT  1. AMS/acute respiratory failure: could be secondary to CAP/COPD exacerbation given history and fever, CXR ambiguous with bibasilar atelectasis/infiltrate. CTA negative for PE, CT of head negative for intracranial acute process. EKG yesterday and repeated this am without significant change or ST abnormalities. Troponin x 1 neg.  Seizure is also plays a role in the differential  - Continue Rocephin and Clinda (multiple drug allergies and recent levaquin course)  - Continue methylpred, possible transition to PO Prednisone. - Troponin f/u rest  - Blood cultures, sputum cx and urine legionella and strep pending results.  - Restarted on her home Lamictal   2. DM: Last A1C 5.5 not on any meds. CBG's here between 90 to ~ low 100's.  - Monitor closely CBGs and sensitive SSI while on steroids.  3. HTN: normotensive to low. Improved since admission. Gentle hydration since pt has documented Diastolic CHF per ECHO 08/2012. No signs of fluid overload at this time (+945ml)  - Will continue to hold BP medication and gentle hydration.   4. Anemia and thrombocytopenia: found today on morning labs. No appearent source of bleeding.  - Will obtain FOBT.  - Hold lovenox since plat are below 150's.  SCD's for DVT prophylaxis.   5. Seizure disorder: treated with lamictal at home, sees neurology without mention of seizures  in recent past. - will resume her home dose of Lamictal.  6. 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 Asymptomatic - Continue present  abx coverage.  7. Back pain: new onset, concern for vertebral compression fracture - xray lumbar 2 views.   FEN/GI: NPO, NS @ 75.  Prophylaxis: PPI. Holding lovenox due to thrombocytopenia. SCDs  Disposition: pending pt improvement.   Subjective: feeling better. Returned to baseline mental status. Report [place] on her lower back without radiation. Has appetite and desires to eat.   Objective: Temp:  [97.4 F (36.3 C)-99.3 F (37.4 C)] 98.5 F (36.9 C) (12/28 0800) Pulse Rate:  [74-100] 79 (12/28 0800) Resp:  [12-24] 18 (12/28 0800) BP: (82-110)/(43-78) 97/56 mmHg (12/28 0800) SpO2:  [90 %-100 %] 95 % (12/28 0818) FiO2 (%):  [40 %-50 %] 40 % (12/28 0401) Physical Exam: General: obese and NAD Cardiovascular: slightly tachycardic during examination but pt is receiving albuterol at the time. No murmurs. No gallops.   Respiratory: diminished breath sounds bilaterally. I do not auscultate rales, no wheezes.  Abdomen: soft. Normal BS. Extremities: no pitting edema. Normal peripheric pulses. Neuro: oriented x 3. No focalization.   Laboratory:  Recent Labs Lab 08/06/13 0216 08/06/13 1730 08/07/13 0437  WBC 13.4* 8.5 8.7  HGB 11.4* 10.8* 9.8*  HCT 36.3 33.3* 31.2*  PLT 176 173 148*    Recent Labs Lab 08/06/13 0216 08/06/13 1730 08/07/13 0437  NA 143 144 140  K 3.6 3.9 3.6  CL 108 111 110  CO2 22 23 24   BUN 17 17 18   CREATININE  0.88 0.70 0.74  CALCIUM 8.4 8.4 8.0*  PROT 7.2  --   --   BILITOT 0.1*  --   --   ALKPHOS 101  --   --   ALT 7  --   --   AST 14  --   --   GLUCOSE 142* 97 101*   Imaging/Diagnostic Tests: CXR 12/27  IMPRESSION:  Minimal bibasilar airspace opacities may reflect atelectasis or possibly mild pneumonia.  CTA 12/27 IMPRESSION:  1. No evidence of acute pulmonary embolism.  2.  Multiple small peripheral ground-glass alveolar infiltrates. These are similar but not nearly as widespread as by prior CT in January. Some of these may be residual chronic densities and may be related to a chronic process such as BOOP or vasculitis. Component of active pneumonia would be difficult to exclude, especially in the right middle lobe.  CT head 12/27 IMPRESSION:  No acute intracranial process.  Moderate acute on chronic right maxillary sinusitis.   Veronica Piloto de Criselda Peaches, MD 08/07/2013, 8:49 AM PGY-3, [place] [facility] FPTS Intern pager: [phone], text pages welcome

## 2013-08-07 NOTE — Progress Notes (Signed)
Pts. Servo-i pulled/placed Remstar-auto in room for h/s & prn use as per home.

## 2013-08-08 LAB — BASIC METABOLIC PANEL
CO2: 22 mEq/L (ref 19–32)
Calcium: 7.9 mg/dL — ABNORMAL LOW (ref 8.4–10.5)
Chloride: 106 mEq/L (ref 96–112)
Creatinine, Ser: 0.7 mg/dL (ref 0.50–1.10)
Potassium: 3 mEq/L — ABNORMAL LOW (ref 3.5–5.1)
Sodium: 136 mEq/L (ref 135–145)

## 2013-08-08 LAB — GLUCOSE, CAPILLARY
Glucose-Capillary: 164 mg/dL — ABNORMAL HIGH (ref 70–99)
Glucose-Capillary: 84 mg/dL (ref 70–99)
Glucose-Capillary: 87 mg/dL (ref 70–99)
Glucose-Capillary: 92 mg/dL (ref 70–99)

## 2013-08-08 LAB — LEGIONELLA ANTIGEN, URINE: Legionella Antigen, Urine: NEGATIVE

## 2013-08-08 LAB — CBC WITH DIFFERENTIAL/PLATELET
Basophils Absolute: 0 10*3/uL (ref 0.0–0.1)
Eosinophils Relative: 1 % (ref 0–5)
Lymphocytes Relative: 29 % (ref 12–46)
Lymphs Abs: 2.4 10*3/uL (ref 0.7–4.0)
MCHC: 31.8 g/dL (ref 30.0–36.0)
MCV: 96.3 fL (ref 78.0–100.0)
Neutro Abs: 4.8 10*3/uL (ref 1.7–7.7)
Neutrophils Relative %: 59 % (ref 43–77)
Platelets: 145 10*3/uL — ABNORMAL LOW (ref 150–400)
RBC: 3 MIL/uL — ABNORMAL LOW (ref 3.87–5.11)
RDW: 16 % — ABNORMAL HIGH (ref 11.5–15.5)
WBC: 8.1 10*3/uL (ref 4.0–10.5)

## 2013-08-08 MED ORDER — HEPARIN SODIUM (PORCINE) 5000 UNIT/ML IJ SOLN
5000.0000 [IU] | Freq: Three times a day (TID) | INTRAMUSCULAR | Status: DC
  Administered 2013-08-08 – 2013-08-09 (×2): 5000 [IU] via SUBCUTANEOUS
  Filled 2013-08-08 (×5): qty 1

## 2013-08-08 MED ORDER — IPRATROPIUM BROMIDE 0.02 % IN SOLN
0.5000 mg | Freq: Four times a day (QID) | RESPIRATORY_TRACT | Status: DC
  Filled 2013-08-08 (×5): qty 2.5

## 2013-08-08 MED ORDER — CLINDAMYCIN HCL 300 MG PO CAPS
600.0000 mg | ORAL_CAPSULE | Freq: Three times a day (TID) | ORAL | Status: DC
  Administered 2013-08-08: 600 mg via ORAL
  Filled 2013-08-08 (×3): qty 2

## 2013-08-08 MED ORDER — PANTOPRAZOLE SODIUM 40 MG IV SOLR
40.0000 mg | INTRAVENOUS | Status: DC
  Filled 2013-08-08: qty 40

## 2013-08-08 MED ORDER — SODIUM CHLORIDE 0.9 % IV SOLN
INTRAVENOUS | Status: DC
  Administered 2013-08-08 – 2013-08-09 (×2): via INTRAVENOUS

## 2013-08-08 MED ORDER — TOPIRAMATE 100 MG PO TABS
200.0000 mg | ORAL_TABLET | Freq: Every day | ORAL | Status: DC
  Administered 2013-08-08: 200 mg via ORAL
  Filled 2013-08-08 (×3): qty 2

## 2013-08-08 MED ORDER — METHYLPREDNISOLONE SODIUM SUCC 125 MG IJ SOLR
125.0000 mg | Freq: Four times a day (QID) | INTRAMUSCULAR | Status: DC
  Filled 2013-08-08 (×3): qty 2

## 2013-08-08 MED ORDER — TRAMADOL HCL 50 MG PO TABS
50.0000 mg | ORAL_TABLET | Freq: Four times a day (QID) | ORAL | Status: DC | PRN
  Administered 2013-08-08 – 2013-08-09 (×4): 50 mg via ORAL
  Filled 2013-08-08 (×4): qty 1

## 2013-08-08 MED ORDER — TOPIRAMATE 100 MG PO TABS
200.0000 mg | ORAL_TABLET | Freq: Every day | ORAL | Status: DC
  Filled 2013-08-08: qty 2

## 2013-08-08 MED ORDER — TOPIRAMATE 100 MG PO TABS
100.0000 mg | ORAL_TABLET | Freq: Every day | ORAL | Status: DC
  Administered 2013-08-09: 100 mg via ORAL
  Filled 2013-08-08: qty 1

## 2013-08-08 MED ORDER — TOPIRAMATE 100 MG PO TABS
100.0000 mg | ORAL_TABLET | Freq: Every day | ORAL | Status: DC
  Filled 2013-08-08: qty 1

## 2013-08-08 MED ORDER — TOPIRAMATE 100 MG PO TABS: 100.0000 mg | ORAL_TABLET | Freq: Two times a day (BID) | ORAL | Status: DC

## 2013-08-08 MED ORDER — DIPHENHYDRAMINE HCL 25 MG PO CAPS
25.0000 mg | ORAL_CAPSULE | Freq: Four times a day (QID) | ORAL | Status: DC | PRN
  Administered 2013-08-08: 25 mg via ORAL
  Filled 2013-08-08: qty 1

## 2013-08-08 MED ORDER — PREDNISONE 50 MG PO TABS
60.0000 mg | ORAL_TABLET | Freq: Every day | ORAL | Status: DC
  Administered 2013-08-08 – 2013-08-09 (×2): 60 mg via ORAL
  Filled 2013-08-08 (×4): qty 1

## 2013-08-08 MED ORDER — CEPHALEXIN 500 MG PO CAPS
500.0000 mg | ORAL_CAPSULE | Freq: Two times a day (BID) | ORAL | Status: DC
  Administered 2013-08-08: 500 mg via ORAL
  Filled 2013-08-08 (×2): qty 1

## 2013-08-08 MED ORDER — POTASSIUM CHLORIDE CRYS ER 20 MEQ PO TBCR
40.0000 meq | EXTENDED_RELEASE_TABLET | Freq: Once | ORAL | Status: AC
  Administered 2013-08-08: 40 meq via ORAL
  Filled 2013-08-08: qty 2

## 2013-08-08 MED ORDER — CLINDAMYCIN HCL 300 MG PO CAPS
300.0000 mg | ORAL_CAPSULE | Freq: Four times a day (QID) | ORAL | Status: DC
  Administered 2013-08-08 – 2013-08-09 (×4): 300 mg via ORAL
  Filled 2013-08-08 (×7): qty 1

## 2013-08-08 MED ORDER — CEPHALEXIN 500 MG PO CAPS
500.0000 mg | ORAL_CAPSULE | Freq: Four times a day (QID) | ORAL | Status: DC
  Administered 2013-08-08 – 2013-08-09 (×4): 500 mg via ORAL
  Filled 2013-08-08 (×9): qty 1

## 2013-08-08 NOTE — Care Management Note (Addendum)
    Page 1 of 2   08/09/2013     3:16:39 PM   CARE MANAGEMENT NOTE 08/09/2013  Patient:  [name], [name]   Account Number:  [id number]  Date Initiated:  08/08/2013  Documentation initiated by:  Junius Creamer  Subjective/Objective Assessment:   adm w resp failure     Action/Plan:   lives w husband, pcp dr Levert Feinstein   Anticipated DC Date:  08/09/2013   Anticipated DC Plan:  HOME W HOME HEALTH SERVICES      DC Planning Services  CM consult      [facility] Choice  HOME HEALTH   Choice offered to / List presented to:  C-1 Patient   DME arranged  [facility]      DME agency  [facility]     [facility] arranged  HH-1 RN  HH-10 DISEASE MANAGEMENT  HH-2 PT      [facility] agency  [facility]   Status of service:  Completed, signed off Medicare Important Message given?   (If response is "NO", the following Medicare IM given date fields will be blank) Date Medicare IM given:   Date Additional Medicare IM given:    Discharge Disposition:  HOME W HOME HEALTH SERVICES  Per UR Regulation:  Reviewed for med. necessity/level of care/duration of stay  If discussed at Long Length of Stay Meetings, dates discussed:    Comments:  08/09/13 Veronica Mol,RN,BSN [phone] PT FOR DC HOME TODAY WITH SPOUSE.  WILL NEED HH FOLLOW UP, YOUTH RW.  REFERRAL TO [facility], PER PT CHOICE FOR HH AND DME NEEDS.  START OF CARE 24-48H POST DC DATE FOR HH FOLLOW UP.

## 2013-08-08 NOTE — Progress Notes (Signed)
FMTS Attending  Note: Veronica Eniola,MD I  have seen and examined this patient, reviewed their chart. I have discussed this patient with the resident. I agree with the resident's findings, assessment and care plan.  

## 2013-08-08 NOTE — Progress Notes (Signed)
Family Medicine Teaching Service PCP Social Visit  Stopped by to visit [name] today as she is my primary patient. Offered her supportive thoughts and words. Both she and I are glad that she will be evaluated by physical therapy during this hospitalization as this was a goal we had for her as an outpatient and will be important in preventing future falls.   Appreciate excellent care provided for [place] by the family practice teaching service.   Levert Feinstein, MD Family Medicine PGY-2

## 2013-08-08 NOTE — Progress Notes (Signed)
eLink Physician-Brief Progress Note Patient Name: [name] DOB: November 28, 1955 MRN: [id number]  Date of Service  08/08/2013   HPI/Events of Note  K low 3.0  eICU Interventions  K supp KCl po    Intervention Category Major Interventions: Electrolyte abnormality - evaluation and management  Shan Levans 08/08/2013, 5:02 AM

## 2013-08-08 NOTE — Progress Notes (Signed)
Family Medicine Teaching Service Daily Progress Note Intern Pager: [phone]  Patient name: [name] Medical record number: [id number] Date of birth: May 26, 1956 Age: 57 y.o. Gender: female  Primary Care Provider: Levert Feinstein, MD Consultants: none Code Status: full.  Pt Overview and Major Events to Date:  12/27 on bipap 12/28 on O2 per [place]  Assessment and Plan: [name] is a 57 y.o. female presenting with altered mental status and acute respiratory failure following 3 days of cough and SOB. PMH is significant for COPD, OSA, DM, HTN, HLD, epilepsy, depression, and remote DVT  1. AMS/acute respiratory failure: could be secondary to CAP/COPD exacerbation given history and fever, CXR ambiguous with bibasilar atelectasis/infiltrate. CTA negative for PE, CT of head negative for intracranial acute process. EKG yesterday and repeated this am without significant change or ST abnormalities. Seizure/post-ictal state is also in the differential - Transition to oral keflex and clinda (multiple drug allergies and recent levaquin course)  - transition to PO Prednisone. - Troponin neg x3 - Blood cultures NGTD, urine legionella pending and urine strep negative  - Restarted on her home Lamictal   2. DM: Last A1C 5.5 not on any meds. CBG's here 80s - low 100's. - Monitor closely CBGs and sensitive SSI while on steroids, no insulin required in 24 hours  3. HTN: normotensive to low. Improved since admission. Gentle hydration since pt has documented Diastolic CHF per ECHO 08/2012. No signs of fluid overload at this time (+953ml) - Will continue to hold BP medication and gentle hydration.   4. Anemia and thrombocytopenia: found today on morning labs. No appearent source of bleeding. - FOBT pending - Hold lovenox since plat are below 150's.  SCD's for DVT prophylaxis.   5. Seizure disorder: treated with lamictal at home, sees neurology without mention of seizures in recent past. - continue home  Lamictal.  6. 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 Asymptomatic - Continue present  abx coverage.  7. Back pain/frequent falls at home: new onset, concern for vertebral compression fracture - xray lumbar 2 views negative for fracture  - PT consult for strength/balance issues - consider outpatient work-up given family history of muscular dystrophy.  FEN/GI: carb mod diet, NS @ 125.  Prophylaxis: PPI. Holding lovenox due to thrombocytopenia. SCDs  Disposition: pending pt improvement.   Subjective: feeling better. Returned to baseline mental status. No complaints of pain and breathing comfortably this am  Objective: Temp:  [97.5 F (36.4 C)-100.1 F (37.8 C)] 97.5 F (36.4 C) (12/29 0421) Pulse Rate:  [63-103] 69 (12/29 0600) Resp:  [11-36] 13 (12/29 0600) BP: (93-112)/(50-80) 104/69 mmHg (12/29 0600) SpO2:  [88 %-100 %] 93 % (12/29 0600) Physical Exam: General: obese and NAD Cardiovascular: RRR No murmurs. No gallops.   Respiratory: diminished breath sounds bilaterally. I do not auscultate rales, no wheezes.  Abdomen: soft. Normal BS. Extremities: no pitting edema. Normal peripheric pulses. Neuro: oriented x 3. No focalization.    Laboratory:  Recent Labs Lab 08/06/13 1730 08/07/13 0437 08/08/13 0349  WBC 8.5 8.7 8.1  HGB 10.8* 9.8* 9.2*  HCT 33.3* 31.2* 28.9*  PLT 173 148* 145*    Recent Labs Lab 08/06/13 0216 08/06/13 1730 08/07/13 0437 08/08/13 0349  NA 143 144 140 136  K 3.6 3.9 3.6 3.0*  CL 108 111 110 106  CO2 22 23 24 22   BUN 17 17 18 11   CREATININE 0.88 0.70 0.74 0.70  CALCIUM 8.4 8.4 8.0* 7.9*  PROT 7.2  --   --   --   BILITOT 0.1*  --   --   --   ALKPHOS 101  --   --   --   ALT 7  --   --   --   AST 14  --   --   --   GLUCOSE 142* 97 101* 82   Imaging/Diagnostic Tests: CXR 12/27  IMPRESSION:  Minimal bibasilar airspace opacities may reflect atelectasis or possibly mild pneumonia.  CTA 12/27 IMPRESSION:  1. No  evidence of acute pulmonary embolism.  2. Multiple small peripheral ground-glass alveolar infiltrates. These are similar but not nearly as widespread as by prior CT in January. Some of these may be residual chronic densities and may be related to a chronic process such as BOOP or vasculitis. Component of active pneumonia would be difficult to exclude, especially in the right middle lobe.  CT head 12/27 IMPRESSION:  No acute intracranial process.  Moderate acute on chronic right maxillary sinusitis.  Xray lumbar: There is no evidence of lumbar spine fracture. Alignment is normal. Intervertebral disc spaces are maintained.   Beverely Low, MD 08/08/2013, 7:26 AM PGY-1, [facility] Family Medicine FPTS Intern pager: [phone], text pages welcome

## 2013-08-08 NOTE — Progress Notes (Signed)
PT Cancellation Note  Patient Details Name: TENEIL SHILLER MRN: [id number] DOB: 1956-01-20   Cancelled Treatment:    Reason Eval/Treat Not Completed: Other (comment) (PT ordered, but pt on bedrest, resident paged.  ).  We will need an activity order so that we can proceed with PT evaluation.    Thanks,   Rollene Rotunda. Azyriah Nevins, PT, DPT [phone]   08/08/2013, 5:15 PM

## 2013-08-09 LAB — BASIC METABOLIC PANEL
Calcium: 8.9 mg/dL (ref 8.4–10.5)
Chloride: 107 mEq/L (ref 96–112)
Creatinine, Ser: 0.74 mg/dL (ref 0.50–1.10)
GFR calc Af Amer: >90 mL/min (ref >90)
Potassium: 3.8 mEq/L (ref 3.7–5.3)

## 2013-08-09 LAB — CBC
HCT: 31.1 % — ABNORMAL LOW (ref 36.0–46.0)
Hemoglobin: 9.9 g/dL — ABNORMAL LOW (ref 12.0–15.0)
MCH: 30.1 pg (ref 26.0–34.0)
MCHC: 31.8 g/dL (ref 30.0–36.0)
MCV: 94.5 fL (ref 78.0–100.0)
Platelets: 165 K/uL (ref 150–400)
RBC: 3.29 MIL/uL — ABNORMAL LOW (ref 3.87–5.11)
RDW: 15.5 % (ref 11.5–15.5)
WBC: 9.5 K/uL (ref 4.0–10.5)

## 2013-08-09 LAB — GLUCOSE, CAPILLARY
Glucose-Capillary: 136 mg/dL — ABNORMAL HIGH (ref 70–99)
Glucose-Capillary: 145 mg/dL — ABNORMAL HIGH (ref 70–99)

## 2013-08-09 LAB — OCCULT BLOOD X 1 CARD TO LAB, STOOL: Fecal Occult Bld: NEGATIVE

## 2013-08-09 MED ORDER — ALBUTEROL SULFATE (2.5 MG/3ML) 0.083% IN NEBU: 2.5000 mg | INHALATION_SOLUTION | RESPIRATORY_TRACT | Status: DC | PRN

## 2013-08-09 MED ORDER — PREDNISONE 20 MG PO TABS: 40.0000 mg | ORAL_TABLET | Freq: Every day | ORAL | Status: AC

## 2013-08-09 MED ORDER — CLINDAMYCIN HCL 300 MG PO CAPS: 300.0000 mg | ORAL_CAPSULE | Freq: Four times a day (QID) | ORAL | Status: AC

## 2013-08-09 MED ORDER — PANTOPRAZOLE SODIUM 40 MG PO TBEC
40.0000 mg | DELAYED_RELEASE_TABLET | Freq: Every day | ORAL | Status: DC
  Administered 2013-08-09: 40 mg via ORAL
  Filled 2013-08-09: qty 1

## 2013-08-09 MED ORDER — CEPHALEXIN 500 MG PO CAPS: 500.0000 mg | ORAL_CAPSULE | Freq: Four times a day (QID) | ORAL | Status: AC

## 2013-08-09 NOTE — Evaluation (Signed)
Physical Therapy Evaluation Patient Details Name: [name] MRN: [id number] DOB: Mar 04, 1956 Today's Date: 08/09/2013 Time: 0740-0810 PT Time Calculation (min): 30 min  PT Assessment / Plan / Recommendation History of Present Illness  [name] is a 57 y.o. female admitted with altered mental status and acute respiratory failure following 3 days of cough and SOB. PMH is significant for COPD, OSA, DM, HTN, HLD, epilepsy, depression, and remote DVT  Clinical Impression  Pt is doing well with ambulation however reports multiple falls at home, uncertain of cause. Pt appears more stable with RW and reports no falls when using assistive device at home. Recommend use of RW as opposed to rollator walker for increased stability. Also recommend pt have close supervision/min-guard assist any time she is on her feet. Will continue to follow pt acutely.     PT Assessment  Patient needs continued PT services    Follow Up Recommendations  Home health PT;Supervision/Assistance - 24 hour (close supervision when on feet (right beside pt), use of RW with all ambulation)          Equipment Recommendations  Rolling walker with 5" wheels (youth height)       Frequency Min 3X/week    Precautions / Restrictions Precautions Precautions: Fall Precaution Comments: H/o of ~1 fall per week when ambulating without assistive device. Restrictions Weight Bearing Restrictions: No   Pertinent Vitals/Pain 6/10 back pain since fall.      Mobility  Bed Mobility Bed Mobility: Supine to Sit Supine to Sit: 6: Modified independent (Device/Increase time) Transfers Transfers: Sit to Stand;Stand to Sit Sit to Stand: 5: Supervision Stand to Sit: 5: Supervision Details for Transfer Assistance: Pt demonstrates no delay with sit <> stand, good strength with transition. Supervision primarily due to h/o multiple falls.  Ambulation/Gait Ambulation/Gait Assistance: 5: Supervision Ambulation Distance (Feet): 90  Feet Assistive device: Rolling walker;None Ambulation/Gait Assistance Details: Trial RW vs. no device. Slight increase in imbalance without device, more confidence with RW. Pt reports no falls while using RW. Advised pt to utilize RW at this time  Gait Pattern: Step-through pattern;Decreased stride length (excessive trunk lateral movement, decr. weight shiftign through pelvis. decr. foot clearance. ) Stairs: No Wheelchair Mobility Wheelchair Mobility: No        PT Diagnosis: Difficulty walking;Abnormality of gait;Generalized weakness;Acute pain  PT Problem List: Decreased strength;Decreased activity tolerance;Decreased balance;Decreased mobility;Decreased knowledge of use of DME;Pain;Decreased safety awareness PT Treatment Interventions: DME instruction;Gait training;Stair training;Functional mobility training;Neuromuscular re-education;Balance training;Therapeutic exercise;Therapeutic activities;Cognitive remediation;Patient/family education     PT Goals(Current goals can be found in the care plan section) Acute Rehab PT Goals Patient Stated Goal: Go home today PT Goal Formulation: With patient Time For Goal Achievement: 08/16/13 Potential to Achieve Goals: Good  Visit Information  Last PT Received On: 08/09/13 Assistance Needed: +1 History of Present Illness: [name] is a 57 y.o. female admitted with altered mental status and acute respiratory failure following 3 days of cough and SOB. PMH is significant for COPD, OSA, DM, HTN, HLD, epilepsy, depression, and remote DVT       Prior Functioning  Home Living Family/patient expects to be discharged to:: Private residence Living Arrangements: Spouse/significant other;Children (son) Available Help at Discharge: Family;Available 24 hours/day Type of Home: House Home Access: Stairs to enter [employer] of Steps: 2 Entrance Stairs-Rails: Right;Left Home Layout: One level Home Equipment: Walker - 4 wheels;Shower  seat Prior Function Level of Independence: Needs assistance Gait / Transfers Assistance Needed: Just with in the last  week needs walker or wheelchair Comments: Has not been able to walk for about a week, has been using the wheelchair.  Communication Communication: No difficulties Dominant Hand: Right    Cognition  Cognition Arousal/Alertness: Awake/alert Behavior During Therapy: WFL for tasks assessed/performed Overall Cognitive Status: Within Functional Limits for tasks assessed (although appears to have slightly decr. safety awareness. )    Extremity/Trunk Assessment Upper Extremity Assessment Upper Extremity Assessment: Overall WFL for tasks assessed Lower Extremity Assessment Lower Extremity Assessment: Generalized weakness (>3+/5 with MMT of major muscle groups bil. LEs)   Balance Balance Balance Assessed: Yes Standardized Balance Assessment Standardized Balance Assessment: Berg Balance Test Berg Balance Test Sit to Stand: Able to stand without using hands and stabilize independently Standing Unsupported: Able to stand safely 2 minutes Sitting with Back Unsupported but Feet Supported on Floor or Stool: Able to sit safely and securely 2 minutes Stand to Sit: Sits safely with minimal use of hands Transfers: Able to transfer safely, minor use of hands Standing Unsupported with Eyes Closed: Able to stand 10 seconds with supervision Standing Ubsupported with Feet Together: Able to place feet together independently and stand for 1 minute with supervision From Standing, Reach Forward with Outstretched Arm: Can reach forward >12 cm safely (5") From Standing Position, Pick up Object from Floor: Able to pick up shoe, needs supervision From Standing Position, Turn to Look Behind Over each Shoulder: Looks behind one side only/other side shows less weight shift Turn 360 Degrees: Needs close supervision or verbal cueing Standing Unsupported, Alternately Place Feet on Step/Stool: Able to  complete >2 steps/needs minimal assist Standing Unsupported, One Foot in Front: Able to take small step independently and hold 30 seconds Standing on One Leg: Tries to lift leg/unable to hold 3 seconds but remains standing independently Total Score: 40  End of Session PT - End of Session Equipment Utilized During Treatment: Gait belt Activity Tolerance: Patient tolerated treatment well Patient left: in chair;with call bell/phone within reach Nurse Communication: Mobility status  GP     Wilhemina Bonito 08/09/2013, 9:40 AM

## 2013-08-09 NOTE — Progress Notes (Signed)
FMTS Attending  Note: Correne Lalani,MD I  have seen and examined this patient, reviewed their chart. I have discussed this patient with the resident. I agree with the resident's findings, assessment and care plan.  

## 2013-08-09 NOTE — Care Management (Signed)
Pt placed on CPap for the night at previous settings. Will monitor

## 2013-08-09 NOTE — Progress Notes (Signed)
08/09/13 Nursing note Patient given discharge instructions, AVS and medication list prescriptions sent to patients pharmacy so no paper prescriptions given. Will discharge home as ordered. Zyhir Cappella, Randall An  rN

## 2013-08-09 NOTE — Progress Notes (Signed)
Family Medicine Teaching Service Daily Progress Note Intern Pager: [phone]  Patient name: [name] Medical record number: [id number] Date of birth: [birth year] Age: 57 y.o. Gender: female  Primary Care Provider: Levert Feinstein, MD Consultants: none Code Status: full.  Pt Overview and Major Events to Date:  12/27 on bipap 12/28 on O2 per [facility] 12/29 likely pna improving, transitioned to PO keflex and clindamycin  Assessment and Plan: [name] is a 57 y.o. female presenting with altered mental status and acute respiratory failure following 3 days of cough and SOB. PMH is significant for COPD, OSA, DM, HTN, HLD, epilepsy, depression, and remote DVT.  1. AMS/acute respiratory failure: Improving, likely due to CAP vs COPD exacerbation with findings on CTA, clinical history and fever, CXR ambiguous with bibasilar atelectasis/infiltrate. CTA negative for PE, CT of head negative for intracranial acute process. EKG 12/28 and repeated 12/29 without significant change or ST abnormalities. Seizure/post-ictal state is also in the differential. - 12/29 transitioned to oral keflex and clinda (multiple drug allergies and recent levaquin course). Continue for total 7 days abx (end 1/2). - 12/29 transitioned to PO Prednisone; continue for total 5 days steroids (end 1/1) - Continue duonebs. - Troponin neg x3 - Blood cultures NGTD, urine legionella and urine strep negative, sputum cultures pending - Restarted on her home Lamictal for h/o epilepsy.  2. DM: Last A1C 5.5 not on any meds. CBG's here 80s - low 100's. - Monitor closely CBGs and sensitive SSI while on steroids, 4 units insulin aspart required in 24 hours compared to 0 in prev 24 hours.  3. HTN: normotensive to low. Improved since admission. Gentle hydration since pt has documented Diastolic CHF per ECHO 08/2012. Net pos fluid since admission but no LE edema or dyspnea or crackles (+930ml in 24 hours, +5300 this admission). KVO'd since early  this morning. - Not discharging on antihypertensives. - F/u in clinic.  4. Anemia and thrombocytopenia: found 12/29 on morning labs. No apparent source of bleeding. - FOBT negative. - Held lovenox but Hep SQ ordered 12/29. - Hgb improved 9.2 to 9.9 today, plt 145 to 165 today. - Recheck CBC as outpatient.  5. Seizure disorder: treated with lamictal at home, sees neurology without mention of seizures in recent past. - continue home Lamictal.  6. 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 Asymptomatic - Continue present abx coverage.  7. Back pain/frequent falls at home: new onset, concern for vertebral compression fracture - xray lumbar 2 views negative for fracture. - PT recommending youth rolling walker and home health PT with 24 hour supervision. Ordered all of these and CM organizing these prior to discharge. Pt's husband and son are with her at home at all times. - consider outpatient work-up given family history of muscular dystrophy.  8. Hypokalemia - 3.0 found on yesterday's labs - Resolved today to 3.8.  FEN/GI: carb mod diet, KVO'ed this morning. Prophylaxis: PPI. Holding lovenox due to thrombocytopenia, but restarted SQ heparin.  Disposition: Plan for discharge today with normal mentation and no dyspnea with stable vitals.  Subjective: Patient feels well today, would like to go home, denies shortness of breath. Had bowel movement this morning that was not bloody.  Objective: Temp:  [97.7 F (36.5 C)-98.3 F (36.8 C)] 98.3 F (36.8 C) (12/30 0639) Pulse Rate:  [72-89] 79 (12/30 0639) Resp:  [11-20] 18 (12/30 0639) BP: (94-129)/(47-79) 129/79 mmHg (12/30 0639) SpO2:  [90 %-96 %] 93 % (12/30 5784) Physical Exam:  General: obese and NAD, seated in chair Cardiovascular: RRR No murmurs. No gallops. Distant.   Respiratory: CTAB with good air entry and faint wheezing throughout Abdomen: soft.  Extremities: no pitting edema. 2+ Bilateral DP  pulses. Neuro: oriented x 3. Exam nonfocal. Normal speech.  Laboratory:  Recent Labs Lab 08/06/13 1730 08/07/13 0437 08/08/13 0349  WBC 8.5 8.7 8.1  HGB 10.8* 9.8* 9.2*  HCT 33.3* 31.2* 28.9*  PLT 173 148* 145*    Recent Labs Lab 08/06/13 0216 08/06/13 1730 08/07/13 0437 08/08/13 0349  NA 143 144 140 136  K 3.6 3.9 3.6 3.0*  CL 108 111 110 106  CO2 22 23 24 22   BUN 17 17 18 11   CREATININE 0.88 0.70 0.74 0.70  CALCIUM 8.4 8.4 8.0* 7.9*  PROT 7.2  --   --   --   BILITOT 0.1*  --   --   --   ALKPHOS 101  --   --   --   ALT 7  --   --   --   AST 14  --   --   --   GLUCOSE 142* 97 101* 82   Imaging/Diagnostic Tests: CXR 12/27  IMPRESSION:  Minimal bibasilar airspace opacities may reflect atelectasis or possibly mild pneumonia.  CTA 12/27 IMPRESSION:  1. No evidence of acute pulmonary embolism.  2. Multiple small peripheral ground-glass alveolar infiltrates. These are similar but not nearly as widespread as by prior CT in January. Some of these may be residual chronic densities and may be related to a chronic process such as BOOP or vasculitis. Component of active pneumonia would be difficult to exclude, especially in the right middle lobe.  CT head 12/27 IMPRESSION:  No acute intracranial process.  Moderate acute on chronic right maxillary sinusitis.  Xray lumbar: There is no evidence of lumbar spine fracture. Alignment is normal. Intervertebral disc spaces are maintained.   Veronica Singleton, MD 08/09/2013, 8:37 AM PGY-2, [facility] FPTS Intern pager: [phone], text pages welcome

## 2013-08-11 NOTE — Discharge Summary (Signed)
[name] Discharge Summary  Patient name: [name] record number: [id number] Date of birth: 09/15/1955 Age: 58 y.o. Gender: female Date of Admission: 08/06/2013  Date of Discharge: 08/09/13 Admitting Physician: Lupita Dawn, MD  Primary Care Provider: Chrisandra Netters, MD Consultants: None  Indication for Hospitalization: Altered mental status and acute respiratory failure  Discharge Diagnoses/Problem List:  1. AMS 2. Acute respiratory failure 3. Diabetes Mellitus 4. Hypertension 5. Anemia 6. Thrombocytopenia 7. Seizure disorder  8. Acute on chronic right maxillary sinusitis  9. Back pain/frequent falls at home 10. Hypokalemia  Disposition: Home  Discharge Condition: Stable  Discharge Exam: See daily progress note dated day of discharge.  Brief [name] Course: [name] is a 58 y.o. female presenting with AMS and acute respiratory failure after 3 days of cough and dyspnea. PMH is significant for COPD, OSA, DM, HTN, HLD, epilepsy, depression, and remote DVT.   AMS/Acute respiratory failure - Likely due to CAP vs COPD exacerbations with findings on CTA, clinical hx and fever, CXR ambiguous with bibasilar atelectasis/infiltrate. Transferred to stepdown for bipap, taken off the following day. CTA negative for PE, CT of head negative for intracranial acute process. EKG 12/28 and repeated 12/29 without significant change or ST abnormalities. Seizure/post-ictal state is also in the differential. Had been started on ceftriaxone and clinda, 12/29 transitioned to oral keflex and clinda (multiple drug allergies and recent levaquin course). Continue for total 7 days abx (end 1/2). Had also started IV steroid, 12/29 transitioned to PO Prednisone; continue for total 5 days steroids (end 1/1). Continued duonebs. Troponins were neg x 3 and blood cultures NGTD, urine legionella and urine strep negative, and sputum cultures pending. Prior to discharge,  respiratory status and mentation had returned to normal.  DM - Last A1C 5.5 not on any meds. CBG's here 80s - low 100's. Monitored closely CBGs and placed pt on sensitive SSI while on steroids, and she received 0-4 units insulin aspart in 24 hours prior to discharge.   HTN - Normotensive to low. Improved since admission. Gentle hydration since pt has documented diastolic CHF per ECHO 11/979. Net pos fluid since admission but no LE edema or dyspnea or crackles (+928m in 24 hours, +5300 this admission). KVO'd since morning of discharge. Held antihypertensives on discharge.   Anemia and thrombocytopenia - found 12/29 on morning labs. No apparent source of bleeding. FOBT negative. Held lovenox but Hep SQ ordered 12/29. Hgb improved 9.2 to 9.9 day of d/c, plt 145 to 165.   Seizure disorder - treated with lamictal at home, sees neurology without mention of seizures in recent past. Continued home Lamictal.   Acute on chronic right maxillary sinusitis: Found on CT head. Asymptomatic. Continued present abx coverage.   Back pain/frequent falls at home - New onset, concern for vertebral compression fracture. Xray lumbar 2 views negative for fracture. PT recommended youth rolling walker and home health PT with 24 hour supervision. Ordered all of these and CM organized prior to discharge. Pt's husband and son are with her at home at all times. Consider outpatient work-up given family history of muscular dystrophy.   Hypokalemia - 3.0 on admission, resolved on d/c to 3.8.  Issues for Follow Up:  - F/u sputum cultures. - F/u blood pressure and ?restart antihypertensives. - Recheck CBC. - Work up frequent falls given FH of Muscular Dystrophy. - F/u fluid status after hydration for hypotension.  Significant Procedures: None  Significant Labs and Imaging:   Recent  Labs Lab 08/07/13 0437 08/08/13 0349 08/09/13 0845  WBC 8.7 8.1 9.5  HGB 9.8* 9.2* 9.9*  HCT 31.2* 28.9* 31.1*  PLT 148* 145* 165     Recent Labs Lab 08/06/13 0216 08/06/13 1730 08/07/13 0437 08/08/13 0349 08/09/13 0845  NA 143 144 140 136 142  K 3.6 3.9 3.6 3.0* 3.8  CL 108 111 110 106 107  CO2 '22 23 24 22 22  ' GLUCOSE 142* 97 101* 82 81  BUN '17 17 18 11 8  ' CREATININE 0.88 0.70 0.74 0.70 0.74  CALCIUM 8.4 8.4 8.0* 7.9* 8.9  ALKPHOS 101  --   --   --   --   AST 14  --   --   --   --   ALT 7  --   --   --   --   ALBUMIN 3.2*  --   --   --   --      Results/Tests Pending at Time of Discharge: Sputum culture.  Discharge Medications:    Medication List    STOP taking these medications       levofloxacin 500 MG tablet  Commonly known as:  LEVAQUIN      TAKE these medications       acetaminophen 325 MG tablet  Commonly known as:  TYLENOL  Take 2 tablets (650 mg total) by mouth every 6 (six) hours as needed for pain or fever (greater than 101). For pain     albuterol 108 (90 BASE) MCG/ACT inhaler  Commonly known as:  PROAIR HFA  Inhale 2 puffs into the lungs every 6 (six) hours as needed. Shortness of breath     albuterol (2.5 MG/3ML) 0.083% nebulizer solution  Commonly known as:  PROVENTIL  Take 2.5 mg by nebulization every 6 (six) hours as needed. For shortness of breath     aspirin EC 81 MG tablet  Take 1 tablet (81 mg total) by mouth daily.     Blood Glucose Meter kit  Use to check blood glucose daily.  Accuchek preffered by patient     BLOOD GLUCOSE TEST STRIPS Strp  Use to test blood glucose daily.     cephALEXin 500 MG capsule  Commonly known as:  KEFLEX  Take 1 capsule (500 mg total) by mouth every 6 (six) hours.     clindamycin 300 MG capsule  Commonly known as:  CLEOCIN  Take 1 capsule (300 mg total) by mouth every 6 (six) hours.     [facility] VITAMIN D 2000 UNITS Caps  Generic drug:  Cholecalciferol  TAKE ONE CAPSULE BY MOUTH EVERY DAY     ferrous sulfate 325 (65 FE) MG tablet  Take 1 tablet (325 mg total) by mouth 2 (two) times daily with a meal.     fluticasone 50 MCG/ACT  nasal spray  Commonly known as:  FLONASE  Place 2 sprays into the nose daily as needed. For congestion.     lamoTRIgine 150 MG tablet  Commonly known as:  LAMICTAL  Take 1 tablet (150 mg total) by mouth 2 (two) times daily.     Loratadine 10 MG Caps  Take 1 capsule (10 mg total) by mouth daily.     nitrofurantoin (macrocrystal-monohydrate) 100 MG capsule  Commonly known as:  MACROBID  Take 100 mg by mouth daily.     omeprazole 40 MG capsule  Commonly known as:  PRILOSEC  Take 1 capsule (40 mg total) by mouth 2 (two) times daily.  predniSONE 20 MG tablet  Commonly known as:  DELTASONE  Take 2 tablets (40 mg total) by mouth daily with breakfast.     simvastatin 40 MG tablet  Commonly known as:  ZOCOR  Take 1 tablet (40 mg total) by mouth at bedtime.     tiotropium 18 MCG inhalation capsule  Commonly known as:  SPIRIVA  Place 1 capsule (18 mcg total) into inhaler and inhale daily.     topiramate 100 MG tablet  Commonly known as:  TOPAMAX  1tab in the am, 2 at bedtime     varenicline 1 MG tablet  Commonly known as:  CHANTIX  Take 1 mg by mouth daily.        Discharge Instructions: Please refer to Patient Instructions section of EMR for full details.  Patient was counseled important signs and symptoms that should prompt return to medical care, changes in medications, dietary instructions, activity restrictions, and follow up appointments.   Follow-Up Appointments: Follow-up Information   Follow up with Chrisandra Netters, MD. Schedule an appointment as soon as possible for a visit in 3 days.   Specialty:  Family Medicine   Contact information:   [place] [address] [phone]       Follow up with [place] being set up by [name] case manager.      Hilton Sinclair, MD 08/11/2013, 5:28 PM PGY-2, [place]

## 2013-08-12 LAB — CULTURE, BLOOD (ROUTINE X 2)
Culture: NO GROWTH
Culture: NO GROWTH

## 2013-08-12 NOTE — Discharge Summary (Signed)
FMTS Attending  Note: Adrean Heitz,MD I  have seen and examined this patient, reviewed their chart. I have discussed this patient with the resident. I agree with the resident's findings, assessment and care plan.  

## 2013-08-16 ENCOUNTER — Encounter: Payer: Self-pay | Admitting: Pharmacist

## 2013-08-16 ENCOUNTER — Ambulatory Visit (INDEPENDENT_AMBULATORY_CARE_PROVIDER_SITE_OTHER): Payer: Medicaid Other | Admitting: Pharmacist

## 2013-08-16 VITALS — BP 126/80 | HR 80 | Ht 60.0 in | Wt 182.0 lb

## 2013-08-16 DIAGNOSIS — F17200 Nicotine dependence, unspecified, uncomplicated: Secondary | ICD-10-CM

## 2013-08-16 DIAGNOSIS — F410 Panic disorder [episodic paroxysmal anxiety] without agoraphobia: Secondary | ICD-10-CM

## 2013-08-16 MED ORDER — SERTRALINE HCL 50 MG PO TABS: 50.0000 mg | ORAL_TABLET | Freq: Every day | ORAL | Status: DC

## 2013-08-16 NOTE — Progress Notes (Signed)
S:  Patient arrives in good spirits visibly anxious. Patient arrives for COPD education and management with PFTs. She was also interested in assistance with tobacco dependence. She has recently (within the last 4 months) experienced increased anxiety. Anxiety is associated with large crowds, grandchildren, and small spaces. She also reports increased jitteriness and anxiety after she decreased her nicotine intake. She deals with anxiety by smoking.  Age when started using tobacco on a daily basis 20 years. Number of Cigarettes per day 5-10 since discharged from hospital 1.5 packs per day prior to hospitalization. Brand smoked Rave. Estimated Nicotine Content per Cigarette 1 mg.  Estimated Nicotine intake per day 10 mg currently.   Smokes first cigarette 30 minutes after waking. Denies waking to smoke.  Most recent quit attempt January. Longest time ever been tobacco free for 3 months (the attempt in January). What Medications (NRT, bupropion, varenicline) used in past includes Chantix, NRT, and e-cigarettes.  Rates IMPORTANCE of quitting tobacco on 1-10 scale of 10. Rates READINESS of quitting tobacco on 1-10 scale of 10. Rates CONFIDENCE of quitting tobacco on 1-10 scale of 7. Triggers to use tobacco include: anxiety, stress, and others smoking in the home.    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      She has cut back to 5-10 cigarettes per day. Over the next month she agrees to keep her intake at 5-10 cigarettes per day. She may cut back if she feels comfortable and does not experience excessive withdrawal symptoms.   She has symptoms of generalized anxiety. Following consultation with Dr. Andria Frames, we initiated Zoloft (sertraline) 50 mg- 1/2 tablet for 3 days then 1 tablet daily. This addition may lead to better outcomes with her  smoking cessation efforts.  During the visit education of proper use of Spiriva (tiotropium). She demonstrated proper technique following coaching. We reinforced the importance of proper technique. We will revisit utilization of PFTs at the next Pharmacy Clinic visit.   Written information provided. F/U Rx Clinic Visit in one month. Total time in face-to-face counseling 35 minutes.  Patient seen with Kathi Der, PharmD Candidate, Jeronimo Norma, PharmD Resident and Wilfred Curtis, PharmD Resident.

## 2013-08-16 NOTE — Patient Instructions (Signed)
Thank you for coming in today!  Keep up the good work cutting back the number of cigarettes you smoke per day.  We sent the prescription of [facility]ft (sertraline[facility]. Don't expect improvement on anxiety for a month.  Try to keep your goal of staying at 5-10 cigarettes per day.   Follow up with [place] in the Pharmacy Clinic in 1 month.

## 2013-08-17 ENCOUNTER — Encounter: Payer: Self-pay | Admitting: Family Medicine

## 2013-08-17 ENCOUNTER — Ambulatory Visit (INDEPENDENT_AMBULATORY_CARE_PROVIDER_SITE_OTHER): Payer: Medicaid Other | Admitting: Family Medicine

## 2013-08-17 VITALS — BP 111/77 | HR 83 | Temp 98.1°F | Ht 60.0 in | Wt 181.5 lb

## 2013-08-17 DIAGNOSIS — G43019 Migraine without aura, intractable, without status migrainosus: Secondary | ICD-10-CM

## 2013-08-17 DIAGNOSIS — D649 Anemia, unspecified: Secondary | ICD-10-CM

## 2013-08-17 MED ORDER — CYCLOBENZAPRINE HCL 5 MG PO TABS: 5.0000 mg | ORAL_TABLET | Freq: Three times a day (TID) | ORAL | Status: DC | PRN

## 2013-08-17 NOTE — Progress Notes (Signed)
Patient ID: [name], female   DOB: 19-May-1956, 58 y.o.   MRN: [id number]  HPI:  Hospital follow up: pt was hospitalized for several days after being found with altered mental status, was treated inpt for pneumonia. She states her breathing has been much better after her hospitalization. She has been getting home health nursing but not physical therapists, despite this being recommended by PT in the hospital. She has not had anymore falls and has been using the walker she got in the hospital. Her coughing and breathing are better. Has been doing breathing treatments about twice per day but hasn't actually needed them last night or today. Has noted that she is constantly thirsty.  Headache: She does have a headache today, this is like her normal chronic constant headache.  Has had normal bowel and bladder function. Has tried tylenol and ibuprofen for her headache and those haven't worked. Her back has been sore since falling (prior to the hospital). Has hx of chronic headaches and was supposed to complete headache diary as outpt but hasn't done this yet due to being hospitalized.  ROS: See HPI  [place]: hx chronic headaches, COPD, current every day smoker but is working on cutting down on how much she smokes  PHYSICAL EXAM: BP 111/77  Pulse 83  Temp(Src) 98.1 F (36.7 C) (Oral)  Ht 5' (1.524 m)  Wt 181 lb 8 oz (82.328 kg)  BMI 35.45 kg/m2 Gen: NAD HEENT: NCAT, PERRL, tongue protrudes midline Heart: RRR Lungs: CTAB, a few scattered wheezes but overall clear, NWOB Neuro: grossly nonfocal, face symmetric, speech intact, alert and oriented, 5/5 strength in all 4 extremities Back: posterior paraspinal muscles TTP  ASSESSMENT/PLAN:  # Back pain: reviewed lumbar spine imaging from hospital which did not show any evidence of acute compression fracture. Suspect has muscular strain after falling. Will rx flexeril to help with muscle spasm, suspect this will also help her headache. F/u in 3  weeks  # Hospital follow up: will recheck CBC today as instructed in hospital d/c summary (due to downtrending Hgb while hospitalized). Will also check CMET since we are checking labs, given her reports of constantly feeling thirsty, to ensure electrolytes normal. Overall doing well after discharge from hospital. I am not sure why she has not gotten home health physical therapy. I've asked her to inquire with the advanced home care nurses about getting this set up as it was ordered prior to discharge. She is falling less with the walker, which is encouraging.  See problem based charting for additional assessment/plan.   FOLLOW UP: F/u in 3 weeks for headaches. Can f/u on back pain at that visit too.  [place]. Ardelia Mems, [place]

## 2013-08-17 NOTE — Patient Instructions (Signed)
Work on headache diary. I sent in flexeril for your back and headache. I will call you if your test results are not normal.  Otherwise, I will send you a letter.  If you do not hear from me with in 2 weeks please call our office.      Work on cutting down on smoking even more.  Come back in 3 weeks with your headache diary. Ask the home health nurses about physical therapy at home.  Be well, Dr. Ardelia Mems

## 2013-08-18 ENCOUNTER — Encounter: Payer: Self-pay | Admitting: Family Medicine

## 2013-08-18 ENCOUNTER — Telehealth: Payer: Self-pay | Admitting: Family Medicine

## 2013-08-18 LAB — COMPREHENSIVE METABOLIC PANEL
ALBUMIN: 3.7 g/dL (ref 3.5–5.2)
ALK PHOS: 92 U/L (ref 39–117)
ALT: 8 U/L (ref 0–35)
AST: 11 U/L (ref 0–37)
BILIRUBIN TOTAL: 0.2 mg/dL — AB (ref 0.3–1.2)
BUN: 16 mg/dL (ref 6–23)
CO2: 25 mEq/L (ref 19–32)
Calcium: 8.9 mg/dL (ref 8.4–10.5)
Chloride: 105 mEq/L (ref 96–112)
Creat: 0.91 mg/dL (ref 0.50–1.10)
Glucose, Bld: 85 mg/dL (ref 70–99)
Potassium: 4.7 mEq/L (ref 3.5–5.3)
SODIUM: 139 meq/L (ref 135–145)
TOTAL PROTEIN: 6.8 g/dL (ref 6.0–8.3)

## 2013-08-18 LAB — CBC WITH DIFFERENTIAL/PLATELET
BASOS PCT: 0 % (ref 0–1)
Basophils Absolute: 0 10*3/uL (ref 0.0–0.1)
EOS PCT: 2 % (ref 0–5)
Eosinophils Absolute: 0.2 10*3/uL (ref 0.0–0.7)
HCT: 34.8 % — ABNORMAL LOW (ref 36.0–46.0)
Hemoglobin: 11.6 g/dL — ABNORMAL LOW (ref 12.0–15.0)
Lymphocytes Relative: 29 % (ref 12–46)
Lymphs Abs: 2.7 10*3/uL (ref 0.7–4.0)
MCH: 30.1 pg (ref 26.0–34.0)
MCHC: 33.3 g/dL (ref 30.0–36.0)
MCV: 90.4 fL (ref 78.0–100.0)
Monocytes Absolute: 0.9 10*3/uL (ref 0.1–1.0)
Monocytes Relative: 10 % (ref 3–12)
NEUTROS PCT: 59 % (ref 43–77)
Neutro Abs: 5.4 10*3/uL (ref 1.7–7.7)
PLATELETS: 310 10*3/uL (ref 150–400)
RBC: 3.85 MIL/uL — ABNORMAL LOW (ref 3.87–5.11)
RDW: 15.2 % (ref 11.5–15.5)
WBC: 9.2 10*3/uL (ref 4.0–10.5)

## 2013-08-18 MED ORDER — OMEPRAZOLE 40 MG PO CPDR: 40.0000 mg | DELAYED_RELEASE_CAPSULE | Freq: Two times a day (BID) | ORAL | Status: DC

## 2013-08-18 NOTE — Telephone Encounter (Signed)
Pt called and needs a refill on her Prilosec and please send it to [facility] in [place]. jw

## 2013-08-18 NOTE — Assessment & Plan Note (Signed)
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      She has cut back to 5-10 cigarettes per day. Over the next month she agrees to keep her intake at 5-10 cigarettes per day. She may cut back if she feels comfortable and does not experience excessive withdrawal symptoms.   She has symptoms of generalized anxiety. Following consultation with Dr. Andria Frames, we initiated Zoloft (sertraline) 50 mg- 1/2 tablet for 3 days then 1 tablet daily. This addition may lead to better outcomes with her smoking cessation efforts.  During the visit education of proper use of Spiriva (tiotropium). She demonstrated proper technique following coaching. We reinforced the importance of proper technique. We will revisit utilization of PFTs at the next Pharmacy Clinic visit.   Written information provided. F/U Rx Clinic Visit in one month. Total time in face-to-face counseling 35 minutes.  Patient seen with Kathi Der, PharmD Candidate, Jeronimo Norma, PharmD Resident and Wilfred Curtis, PharmD Resident.

## 2013-08-18 NOTE — Telephone Encounter (Signed)
Rx sent in [name] J Bennet Kujawa, MD  

## 2013-08-19 ENCOUNTER — Telehealth: Payer: Self-pay | Admitting: Family Medicine

## 2013-08-19 MED ORDER — ALBUTEROL SULFATE (2.5 MG/3ML) 0.083% IN NEBU: 2.5000 mg | INHALATION_SOLUTION | Freq: Four times a day (QID) | RESPIRATORY_TRACT | Status: DC | PRN

## 2013-08-19 NOTE — Telephone Encounter (Signed)
Needs the stuff that goes in nebulizer  [facility]

## 2013-08-19 NOTE — Progress Notes (Signed)
Patient ID: [name], female   DOB: 12-Dec-1955, 58 y.o.   MRN: [id number] Reviewed: Agree with Dr. Graylin Shiver documentation and management.

## 2013-08-19 NOTE — Telephone Encounter (Signed)
Rx sent in. Thanks! Khalise Billard J Merrisa Skorupski, MD  

## 2013-08-19 NOTE — Telephone Encounter (Signed)
Pt states her bp is running low today 98/67 and 90/67. Please advise

## 2013-08-19 NOTE — Telephone Encounter (Signed)
Called patient back again to check on how she's doing. She states she is still somewhat lightheaded but is overall feeling better. Advised that she drink plenty of fluids and take it very easy when she stands up and moves aorund. Also advised that eating some salty foods might help raise her blood pressure. Gave her precautions and advised her to have a low threshold for coming in to ER or urgent care tonight or over the weekend. If her BP is still low tomorrow, I've asked her to come in to Urgent Care to be evaluated. Pt stated her understanding.  Leeanne Rio, MD

## 2013-08-19 NOTE — Telephone Encounter (Signed)
Will fwd to PCP for refill. Javier Glazier, Gerrit Heck

## 2013-08-19 NOTE — Telephone Encounter (Signed)
Returned call to patient. She states her BP was 90/67 a little while ago. She has been a little lightheaded today. Denies having any chest pain. She has not passed out. She is drinking plenty of fluids. Advised that since it is Friday evening after 5pm that there is not much I am able to do for her, but that I recommend she call the clinic Monday morning to schedule an appointment. If her BP drops below 90 systolic, or she starts feeling worse or is concerned over the weekend I have asked her to come in and be seen at urgent care or the emergency room. She understood these instructions.  Leeanne Rio, MD

## 2013-08-21 NOTE — Assessment & Plan Note (Signed)
Unable to give toradol in clinic today because she took ibuprofen earlier today, also want to discourage behavior of requesting toradol every time she presents to clinic for any complaint. Will instead rx flexeril to help with back pain and with her headache. Pt is to complete headache diary as outpatient and return in 3 weeks to follow up on her headaches.

## 2013-08-24 ENCOUNTER — Ambulatory Visit: Payer: Medicaid Other | Admitting: Family Medicine

## 2013-09-07 ENCOUNTER — Other Ambulatory Visit: Payer: Self-pay | Admitting: Family Medicine

## 2013-09-07 DIAGNOSIS — J441 Chronic obstructive pulmonary disease with (acute) exacerbation: Secondary | ICD-10-CM

## 2013-09-07 MED ORDER — FLUTICASONE PROPIONATE 50 MCG/ACT NA SUSP: 2.0000 | Freq: Every day | NASAL | Status: DC | PRN

## 2013-09-07 MED ORDER — ALBUTEROL SULFATE HFA 108 (90 BASE) MCG/ACT IN AERS: 2.0000 | INHALATION_SPRAY | Freq: Four times a day (QID) | RESPIRATORY_TRACT | Status: DC | PRN

## 2013-09-07 MED ORDER — SIMVASTATIN 40 MG PO TABS: 40.0000 mg | ORAL_TABLET | Freq: Every day | ORAL | Status: DC

## 2013-09-10 ENCOUNTER — Other Ambulatory Visit: Payer: Self-pay | Admitting: Family Medicine

## 2013-09-14 ENCOUNTER — Ambulatory Visit: Payer: Medicaid Other | Admitting: Family Medicine

## 2013-09-20 ENCOUNTER — Ambulatory Visit: Payer: Medicaid Other | Admitting: Pharmacist

## 2013-10-05 ENCOUNTER — Telehealth: Payer: Self-pay | Admitting: Family Medicine

## 2013-10-05 ENCOUNTER — Encounter: Payer: Self-pay | Admitting: Family Medicine

## 2013-10-05 ENCOUNTER — Ambulatory Visit (INDEPENDENT_AMBULATORY_CARE_PROVIDER_SITE_OTHER): Payer: Medicaid Other | Admitting: Family Medicine

## 2013-10-05 VITALS — BP 120/78 | HR 73 | Temp 98.2°F | Ht 60.0 in | Wt 182.0 lb

## 2013-10-05 DIAGNOSIS — F411 Generalized anxiety disorder: Secondary | ICD-10-CM

## 2013-10-05 DIAGNOSIS — Z720 Tobacco use: Secondary | ICD-10-CM

## 2013-10-05 DIAGNOSIS — J441 Chronic obstructive pulmonary disease with (acute) exacerbation: Secondary | ICD-10-CM

## 2013-10-05 DIAGNOSIS — F410 Panic disorder [episodic paroxysmal anxiety] without agoraphobia: Secondary | ICD-10-CM

## 2013-10-05 DIAGNOSIS — F17200 Nicotine dependence, unspecified, uncomplicated: Secondary | ICD-10-CM

## 2013-10-05 DIAGNOSIS — E119 Type 2 diabetes mellitus without complications: Secondary | ICD-10-CM

## 2013-10-05 LAB — POCT GLYCOSYLATED HEMOGLOBIN (HGB A1C): HEMOGLOBIN A1C: 5.4

## 2013-10-05 MED ORDER — PREDNISONE 50 MG PO TABS: ORAL_TABLET | ORAL | Status: DC

## 2013-10-05 MED ORDER — SERTRALINE HCL 50 MG PO TABS: 50.0000 mg | ORAL_TABLET | Freq: Every day | ORAL | Status: DC

## 2013-10-05 NOTE — Telephone Encounter (Signed)
[place] red team, please call pt and let her know I filled out the form for [employer] regarding the need for electricity for her nebulizer and CPAP machine. This form has been placed at the front desk for her to pick up at her convenience.  Thanks! Leeanne Rio, MD

## 2013-10-05 NOTE — Patient Instructions (Addendum)
It was great to see you again today!  We are treating you with prednisone for your COPD exacerbation. The only way this is going to get better is if you quit smoking. Return if you are not better in 5 days. Also use mucinex for the cough.  Please follow up with me in 2 weeks to discuss your headaches. Bring your headache diary to that visit.  Be well, Dr. Ardelia Mems   Chronic Obstructive Pulmonary Disease Chronic obstructive pulmonary disease (COPD) is a common lung condition in which airflow from the lungs is limited. COPD is a general term that can be used to describe many different lung problems that limit airflow, including both chronic bronchitis and emphysema. If you have COPD, your lung function will probably never return to normal, but there are measures you can take to improve lung function and make yourself feel better.  CAUSES   Smoking (common).   Exposure to secondhand smoke.   Genetic problems.  Chronic inflammatory lung diseases or recurrent infections. SYMPTOMS   Shortness of breath, especially with physical activity.   Deep, persistent (chronic) cough with a large amount of thick mucus.   Wheezing.   Rapid breaths (tachypnea).   Gray or bluish discoloration (cyanosis) of the skin, especially in fingers, toes, or lips.   Fatigue.   Weight loss.   Frequent infections or episodes when breathing symptoms become much worse (exacerbations).   Chest tightness. DIAGNOSIS  Your healthcare provider will take a medical history and perform a physical examination to make the initial diagnosis. Additional tests for COPD may include:   Lung (pulmonary) function tests.  Chest X-ray.  CT scan.  Blood tests. TREATMENT  Treatment available to help you feel better when you have COPD include:   Inhaler and nebulizer medicines. These help manage the symptoms of COPD and make your breathing more comfortable  Supplemental oxygen. Supplemental oxygen is  only helpful if you have a low oxygen level in your blood.   Exercise and physical activity. These are beneficial for nearly all people with COPD. Some people may also benefit from a pulmonary rehabilitation program. HOME CARE INSTRUCTIONS   Take all medicines (inhaled or pills) as directed by your health care provider.  Only take over-the-counter or prescription medicines for pain, fever, or discomfort as directed by your health care provider.   Avoid over-the-counter medicines or cough syrups that dry up your airway (such as antihistamines) and slow down the elimination of secretions unless instructed otherwise by your healthcare provider.   If you are a smoker, the most important thing that you can do is stop smoking. Continuing to smoke will cause further lung damage and breathing trouble. Ask your health care provider for help with quitting smoking. He or she can direct you to community resources or hospitals that provide support.  Avoid exposure to irritants such as smoke, chemicals, and fumes that aggravate your breathing.  Use oxygen therapy and pulmonary rehabilitation if directed by your health care provider. If you require home oxygen therapy, ask your healthcare provider whether you should purchase a pulse oximeter to measure your oxygen level at home.   Avoid contact with individuals who have a contagious illness.  Avoid extreme temperature and humidity changes.  Eat healthy foods. Eating smaller, more frequent meals and resting before meals may help you maintain your strength.  Stay active, but balance activity with periods of rest. Exercise and physical activity will help you maintain your ability to do things you want to do.  Preventing infection and hospitalization is very important when you have COPD. Make sure to receive all the vaccines your health care provider recommends, especially the pneumococcal and influenza vaccines. Ask your healthcare provider whether you  need a pneumonia vaccine.  Learn and use relaxation techniques to manage stress.  Learn and use controlled breathing techniques as directed by your health care provider. Controlled breathing techniques include:   Pursed lip breathing. Start by breathing in (inhaling) through your nose for 1 second. Then, purse your lips as if you were going to whistle and breathe out (exhale) through the pursed lips for 2 seconds.   Diaphragmatic breathing. Start by putting one hand on your abdomen just above your waist. Inhale slowly through your nose. The hand on your abdomen should move out. Then purse your lips and exhale slowly. You should be able to feel the hand on your abdomen moving in as you exhale.   Learn and use controlled coughing to clear mucus from your lungs. Controlled coughing is a series of short, progressive coughs. The steps of controlled coughing are:  1. Lean your head slightly forward.  2. Breathe in deeply using diaphragmatic breathing.  3. Try to hold your breath for 3 seconds.  4. Keep your mouth slightly open while coughing twice.  5. Spit any mucus out into a tissue.  6. Rest and repeat the steps once or twice as needed. SEEK MEDICAL CARE IF:   You are coughing up more mucus than usual.   There is a change in the color or thickness of your mucus.   Your breathing is more labored than usual.   Your breathing is faster than usual.  SEEK IMMEDIATE MEDICAL CARE IF:   You have shortness of breath while you are resting.   You have shortness of breath that prevents you from:  Being able to talk.   Performing your usual physical activities.   You have chest pain lasting longer than 5 minutes.   Your skin color is more cyanotic than usual.  You measure low oxygen saturations for longer than 5 minutes with a pulse oximeter. MAKE SURE YOU:   Understand these instructions.  Will watch your condition.  Will get help right away if you are not doing well  or get worse. Document Released: 05/07/2005 Document Revised: 05/18/2013 Document Reviewed: 03/24/2013 [facility] Patient Information 2014 [place], [place].

## 2013-10-06 ENCOUNTER — Ambulatory Visit: Payer: Medicaid Other | Admitting: Pharmacist

## 2013-10-07 ENCOUNTER — Encounter: Payer: Self-pay | Admitting: Family Medicine

## 2013-10-07 NOTE — Telephone Encounter (Signed)
Pt notified.  Jammie Clink L, CMA  

## 2013-10-09 NOTE — Progress Notes (Signed)
Patient ID: [name], female   DOB: [birth year], 58 y.o.   MRN: [id number]  HPI:  Anxiety: states her anxiety is much better since starting zoloft. Denies any SI/HI. She is tolerating the medication well. Requests refill today.  Bronchitis: has had a productive cough for the last 2 weeks. It is not getting any better. She is producing phlegm. It is associated with shortness of breath. No fever. She has felt congested in her face. She currently smokes 1/2 to 1 ppd. Has been using nebulizer albuterol about 3x per day and MDI about 4 times per day. Also does take spiriva but no other inhalers. States she has mild chest discomfort when she coughs but this is not associated with exertion. No swelling in her legs.   Form completion: Recently moved in with her son. Needs me to fill out a form so their home can have priority during power outages due to her CPAP and nebulizer.  ROS: See HPI  [place]: hx COPD with frequent exacerbations, poorly controlled, also hx of chronic migraine (requests toradol during every clinic visit)  PHYSICAL EXAM: BP 120/78  Pulse 73  Temp(Src) 98.2 F (36.8 C) (Oral)  Ht 5' (1.524 m)  Wt 182 lb (82.555 kg)  BMI 35.54 kg/m2 O2: 96% at rest, down to 91-94% with ambulation on room air Gen: NAD HEENT: NCAT, MMM, no oropharyngeal exudates Heart: RRR, no murmurs Lungs: CTAB, no wheezes or crackles audible Neuro: grossly nonfocal, ambulates independently Ext: No appreciable lower extremity edema bilaterally  Psych: normal range of affect, well groomed, speech normal in rate and volume, normal eye contact  ASSESSMENT/PLAN:  # Form completion: we will call pt when form is ready.  # Note: at very end of visit when we were wrapping up pt requested something IM for her headache, which she stated was her typical chronic migraine. This was not a problem we addressed during the visit. I advised that pt schedule separate visit to discuss headaches. She requests toradol and/or  phenergan every time she comes to clinic for her chronic migraine and this is a behavior that we need to avoid reinforcing.  See problem based charting for additional assessment/plan.   FOLLOW UP: F/u in 5 days if breathing not improved. F/u in 2 weeks to discuss headache control.   [place]. Ardelia Mems, [place]

## 2013-10-09 NOTE — Assessment & Plan Note (Signed)
Unclear if this is a true COPD exacerbation or if it is just poorly controlled chronic lung disease. Pt was seen by Dr. Valentina Lucks last month for PFT's but these were deferred to a future visit. I think she would likely benefit from an inhaled corticosteroid in the long term, but will await until she is out of this acute exacerbation to start that. I do not hear anything focal on exam to suggest PNA. Given that she has MANY antibiotic allergies (limiting our choices essentially to levaquin) and the relatively mild nature of this possible exacerbation, will hold off on initiating abx at this time. Will rx 5 day burst of prednisone and have pt f/u in the near future to be reassessed. She actually has an appt in pharmacy clinic next week.  Precepted with Dr. Adrian Blackwater who agrees with this plan.

## 2013-10-09 NOTE — Assessment & Plan Note (Signed)
Stressed the importance of smoking cessation 

## 2013-10-09 NOTE — Assessment & Plan Note (Signed)
Improved with zoloft. Tolerating well. Will refill today and continue current dose.

## 2013-10-11 ENCOUNTER — Ambulatory Visit: Payer: Medicaid Other | Admitting: Pharmacist

## 2013-10-20 ENCOUNTER — Ambulatory Visit: Payer: Medicaid Other | Admitting: Pharmacist

## 2013-10-21 ENCOUNTER — Other Ambulatory Visit: Payer: Self-pay | Admitting: *Deleted

## 2013-10-21 MED ORDER — FLUTICASONE PROPIONATE 50 MCG/ACT NA SUSP: 2.0000 | Freq: Every day | NASAL | Status: DC | PRN

## 2013-10-21 MED ORDER — ALBUTEROL SULFATE (2.5 MG/3ML) 0.083% IN NEBU: 2.5000 mg | INHALATION_SOLUTION | Freq: Four times a day (QID) | RESPIRATORY_TRACT | Status: DC | PRN

## 2013-10-27 ENCOUNTER — Encounter: Payer: Self-pay | Admitting: Clinical

## 2013-10-27 ENCOUNTER — Encounter: Payer: Self-pay | Admitting: Pharmacist

## 2013-10-27 ENCOUNTER — Ambulatory Visit (INDEPENDENT_AMBULATORY_CARE_PROVIDER_SITE_OTHER): Payer: Medicaid Other | Admitting: Pharmacist

## 2013-10-27 VITALS — BP 121/75 | HR 76 | Ht 61.5 in | Wt 181.2 lb

## 2013-10-27 DIAGNOSIS — R0902 Hypoxemia: Secondary | ICD-10-CM

## 2013-10-27 DIAGNOSIS — Z720 Tobacco use: Secondary | ICD-10-CM

## 2013-10-27 DIAGNOSIS — F17200 Nicotine dependence, unspecified, uncomplicated: Secondary | ICD-10-CM

## 2013-10-27 DIAGNOSIS — J449 Chronic obstructive pulmonary disease, unspecified: Secondary | ICD-10-CM

## 2013-10-27 MED ORDER — NICOTINE POLACRILEX 4 MG MT GUM: 4.0000 mg | CHEWING_GUM | OROMUCOSAL | Status: DC | PRN

## 2013-10-27 NOTE — Assessment & Plan Note (Signed)
Spirometry evaluation with pre and post Bronchodilator reveals moderate obstruction with no significant improvement postmed.  Patient has been experiencing coughing and breathlessness for a few months and taking albuterol inhaler and nebulizer and Spiriva. Continue treatment plan at this time.  Consider addition of Dulera at next visit.  Stressed the importance of smoking cessation for improvement of lung function.   Reviewed results of pulmonary function tests.  Pt verbalized understanding of results and education. Written pt instructions provided.  F/U Clinic visit early April.   Total time in face to face counseling 30 minutes.  Patient seen with Epimenio Sarin, PharmD Candidate and Nicholas Lose, PharmD, BCPS

## 2013-10-27 NOTE — Assessment & Plan Note (Addendum)
Severe nicotine dependence of 37 years duration in a patient who is good candidate for success b/c of 10/10 importance and readiness and 5/10 confidence.   Initiated nicotine replacement tx with nicotine 4 mg gum, up to 12 pieces per day prn craving. Patient counseled on purpose, proper use (chew and park method), and potential adverse effects, including tingling, peppery taste, and irritation.  Follow up 2 weeks.   Spirometry evaluation with pre and post Bronchodilator reveals moderate obstruction with no significant improvement postmed.  Patient has been experiencing coughing and breathlessness for a few months and taking albuterol inhaler and nebulizer and Spiriva. Continue treatment plan at this time.  Consider addition of Dulera at next visit.  Stressed the importance of smoking cessation for improvement of lung function.   Reviewed results of pulmonary function tests.  Pt verbalized understanding of results and education. Written pt instructions provided.  F/U Clinic visit early April.   Total time in face to face counseling 30 minutes.  Patient seen with Epimenio Sarin, PharmD Candidate and Nicholas Lose, PharmD, BCPS

## 2013-10-27 NOTE — Assessment & Plan Note (Signed)
Severe nicotine dependence of 37 years duration in a patient who is good candidate for success b/c of 10/10 importance and readiness and 5/10 confidence.   Initiated nicotine replacement tx with nicotine 4 mg gum, up to 12 pieces per day prn craving. Patient counseled on purpose, proper use (chew and park method), and potential adverse effects, including tingling, peppery taste, and irritation.  Follow up 2 weeks.   Spirometry evaluation with pre and post Bronchodilator reveals moderate obstruction with no significant improvement postmed.  Patient has been experiencing coughing and breathlessness for a few months and taking albuterol inhaler and nebulizer and Spiriva. Continue treatment plan at this time.  Consider addition of Dulera at next visit.  Stressed the importance of smoking cessation for improvement of lung function.   Reviewed results of pulmonary function tests.  Pt verbalized understanding of results and education. Written pt instructions provided.  F/U Clinic visit early April.   Total time in face to face counseling 30 minutes.  Patient seen with Lauren [name], PharmD Candidate and Pete Koval, PharmD, BCPS 

## 2013-10-27 NOTE — Progress Notes (Signed)
Clinical Education officer, museum (CSW) informed that pt spouse is interested in pt obtaining a power wheel chair. CSW met with pt and informed her that she would need to contact [facility] (pt previously received HH from [facility]) and speak to their Rehab and equipment department [phone]. A representative would then schedule pt for a face to face appointment with her PCP and pt. Mobility would be examined. Pt appreciative of information and appeared unaware that husband had made a request.  Hunt Oris, MSW, LCSW [phone]

## 2013-10-27 NOTE — Progress Notes (Signed)
Patient ID: [name], female   DOB: 11/01/1955, 57 y.o.   MRN: [id number] Reviewed: Agree with Dr. Koval's documentation and management. 

## 2013-10-27 NOTE — Progress Notes (Signed)
S:  Patient arrives in good spirits for evaluation/assistance with tobacco dependence and for evaluation of lung function. Patient reports breathing has been good lately. Hasn't had to use nebulizer this week. Before previous doctor appt, breathing was bad with lots of coughing.   O: See "scanned report" or Documentation Flowsheet (discrete results - PFTs) for  Spirometry results. Patient provided good effort while attempting spirometry.   Lung Age = 20 Albuterol Neb  Lot# [id number]     Exp. Oct 2016  Age when started using tobacco on a daily basis 58 yo. Number of Cigarettes per day 20 for the last year. Used to smoke about 3 ppd for 2 years. Smoked 2 ppd for 3 years. Also smoked 1.5 ppd for most of adult life  Brand smoked Rave, used to smoke [employer]. Estimated Nicotine Content per Cigarette (mg) 1.0.  Estimated Nicotine intake per day 20 mg.   Smokes first cigarette 10 minutes after waking. Does not currently wake up in the middle of the night to smoke. But used to wake up ~2x/night when smoking 1.5 ppd.     Estimated Fagerstrom Score >5.  Most recent quit attempt last time in the hospital, 07/2013 Longest time ever been tobacco free 1 week. What Medications (NRT, bupropion, varenicline) used in past includes gum and patches.  Rates IMPORTANCE of quitting tobacco on 1-10 scale of 10. Rates READINESS of quitting tobacco on 1-10 scale of 10. Rates CONFIDENCE of quitting tobacco on 1-10 scale of 5 for a week. Triggers to use tobacco include stress when family is home, when grandchildren are home, other smokers in the family   A/P: Severe nicotine dependence of 37 years duration in a patient who is good candidate for success b/c of 10/10 importance and readiness and 5/10 confidence.   Initiated nicotine replacement tx with nicotine 4 mg gum, up to 12 pieces per day prn craving. Patient counseled on purpose, proper use (chew and park method), and potential adverse effects, including  tingling, peppery taste, and irritation.  Follow up 2 weeks.   Spirometry evaluation with pre and post Bronchodilator reveals moderate obstruction with no significant improvement postmed.  Patient has been experiencing coughing and breathlessness for a few months and taking albuterol inhaler and nebulizer and Spiriva. Continue treatment plan at this time.  Consider addition of Dulera at next visit.  Stressed the importance of smoking cessation for improvement of lung function.   Reviewed results of pulmonary function tests.  Pt verbalized understanding of results and education. Written pt instructions provided.  F/U Clinic visit early April.   Total time in face to face counseling 30 minutes.  Patient seen with Epimenio Sarin, PharmD Candidate and Nicholas Lose, PharmD, BCPS

## 2013-10-27 NOTE — Patient Instructions (Addendum)
Thank you for coming in today!  Try to cut down on the number of cigarettes per day.   Try nicotine gum to help with quit attempt.   Lung function test today shows moderate obstruction.   Quit date/cut down: this weekend 3/20-3/22

## 2013-11-04 ENCOUNTER — Encounter: Payer: Self-pay | Admitting: Family Medicine

## 2013-11-04 ENCOUNTER — Ambulatory Visit (INDEPENDENT_AMBULATORY_CARE_PROVIDER_SITE_OTHER): Payer: Medicaid Other | Admitting: Family Medicine

## 2013-11-04 VITALS — BP 123/80 | HR 76 | Temp 97.9°F | Ht 61.0 in | Wt 180.6 lb

## 2013-11-04 DIAGNOSIS — R55 Syncope and collapse: Secondary | ICD-10-CM

## 2013-11-04 DIAGNOSIS — S40022A Contusion of left upper arm, initial encounter: Secondary | ICD-10-CM

## 2013-11-04 DIAGNOSIS — S40029A Contusion of unspecified upper arm, initial encounter: Secondary | ICD-10-CM

## 2013-11-04 MED ORDER — DICLOFENAC SODIUM 75 MG PO TBEC: 75.0000 mg | DELAYED_RELEASE_TABLET | Freq: Two times a day (BID) | ORAL | Status: DC

## 2013-11-04 MED ORDER — KETOROLAC TROMETHAMINE 60 MG/2ML IM SOLN
60.0000 mg | Freq: Once | INTRAMUSCULAR | Status: AC
  Administered 2013-11-04: 60 mg via INTRAMUSCULAR

## 2013-11-04 NOTE — Progress Notes (Signed)
Subjective:    Patient ID: [name], female    DOB: 07/09/1956, 58 y.o.   MRN: [id number]  HPI  58 year old female with complex past medical history were sent for evaluation of a fall. She fell two days ago. She fell down 4 stairs and landed on her hip and also fell in your yard. This was due to being lightheaded. She denies syncope. She was not having any chest pain or shortness of breath at the time. She was not taking BP meds or using alcohol. As a result of the fall, she has arm pain in the left. She has also had leg pain on her lateral left knee. She also has a severe headache today, which is typical for her. Currently, she is "lightheaded" in all positions.   The patient has numerous notes documenting BPPV. A note in October 2014 states that she was prescribed meclizine and refer to rehabilitation for vestibular exercises. The patient does not take meclizine and cannot remember this medicine. Also, she states that she did not have rehabilitation due to constraints from insurance.   PMH - skin cancer of left shoulder s/p excisino PSH - rotator cuff surgery left, 2008 (Dr. Noemi Chapel)  Current Outpatient Prescriptions on File Prior to Visit  Medication Sig Dispense Refill  . acetaminophen (TYLENOL) 325 MG tablet Take 2 tablets (650 mg total) by mouth every 6 (six) hours as needed for pain or fever (greater than 101). For pain      . albuterol (PROAIR HFA) 108 (90 BASE) MCG/ACT inhaler Inhale 2 puffs into the lungs every 6 (six) hours as needed. Shortness of breath  1 Inhaler  3  . albuterol (PROVENTIL) (2.5 MG/3ML) 0.083% nebulizer solution Take 3 mLs (2.5 mg total) by nebulization every 6 (six) hours as needed. For shortness of breath  75 mL  3  . aspirin EC 81 MG tablet Take 1 tablet (81 mg total) by mouth daily.      . Blood Glucose Monitoring Suppl (BLOOD GLUCOSE METER) kit Use to check blood glucose daily.  Accuchek preffered by patient  1 each  0  . CVS VITAMIN D 2000 UNITS CAPS TAKE  ONE CAPSULE BY MOUTH EVERY DAY  30 capsule  1  . cyclobenzaprine (FLEXERIL) 5 MG tablet Take 1 tablet (5 mg total) by mouth 3 (three) times daily as needed for muscle spasms.  30 tablet  0  . ferrous sulfate 325 (65 FE) MG tablet Take 1 tablet (325 mg total) by mouth 2 (two) times daily with a meal.  60 tablet  3  . fluticasone (FLONASE) 50 MCG/ACT nasal spray Place 2 sprays into both nostrils daily as needed. For congestion.  16 g  1  . Glucose Blood (BLOOD GLUCOSE TEST STRIPS) STRP Use to test blood glucose daily.  100 each  12  . lamoTRIgine (LAMICTAL) 150 MG tablet Take 1 tablet (150 mg total) by mouth 2 (two) times daily.  60 tablet  2  . Loratadine 10 MG CAPS Take 1 capsule (10 mg total) by mouth daily.  30 each  5  . nicotine polacrilex (CVS NICOTINE POLACRILEX) 4 MG gum Take 1 each (4 mg total) by mouth as needed for smoking cessation (up to 12 pieces daily).  240 tablet  5  . omeprazole (PRILOSEC) 40 MG capsule Take 1 capsule (40 mg total) by mouth 2 (two) times daily.  60 capsule  5  . sertraline (ZOLOFT) 50 MG tablet Take 1 tablet (50 mg total)  by mouth daily.  30 tablet  2  . simvastatin (ZOCOR) 40 MG tablet Take 1 tablet (40 mg total) by mouth at bedtime.  30 tablet  3  . tiotropium (SPIRIVA) 18 MCG inhalation capsule Place 1 capsule (18 mcg total) into inhaler and inhale daily.  30 capsule  11  . topiramate (TOPAMAX) 100 MG tablet 1tab in the am, 2 at bedtime  270 tablet  3   No current facility-administered medications on file prior to visit.      Review of Systems Positive for lightheadedness, arm pain Negative for chest pain, palpitations, shortness of breath, syncope    Objective:   Physical Exam There were no vitals taken for this visit. Gen: middle aged, chronically ill appearing, non distressed, smells strongly of smoke, obese HEENT: NCAT, PERRLA, EOMI CV: RRR Lungs: CTA-B MSK: left upper arm with tenderness to palpation but no deformity and no bruising, normal ROM  of left elbow and left shoulder; left knee with tenderness at lateral joint line but not swelling or decreased ROM  Neuro: no focal deficits, no nystagmus  Orthostatic vital performed and normal - see chart for details      Assessment & Plan:  Lightheadedness/Pre-syncope: This sounds like a persistent problem for > 6 mos, but was not clearly vertiginous in her description. She does not have orthostasis today. Since pt without chest pain, palpitations or dyspnea, unlikely to be acute PE. Pt has never has a carotid duplex ultrasounds to r/o carotid disease, so I will order that today. F/u with PCP after this is complete.   MSK Pain - Pt without any obvious sign of trauma to LUE or LLE. Given her subjective tenderness, I will prescribe a NSAID to use for pain relief. No imaging indicated at this time.

## 2013-11-04 NOTE — Patient Instructions (Signed)
Dear Ms. Greenawalt,   Thank you for coming to clinic today. Please read below regarding the issues that we discussed.   1. Dizziness - I would like to check your arteries in your neck to make sure that they are not clogged, since you have already had a negative cardiac work up.   2. Pain - You have contusions or bruising of the arm and knee from the fall. Take voltaren as needed. I hope that you feel better soon.   Please follow up in clinic in 2 weeks. Please call earlier if you have any questions or concerns.   Sincerely,   Dr. Maricela Bo

## 2013-11-09 ENCOUNTER — Ambulatory Visit (HOSPITAL_COMMUNITY)
Admission: RE | Admit: 2013-11-09 | Discharge: 2013-11-09 | Disposition: A | Discharge status: Home / Self Care | Payer: Medicaid Other | Source: Ambulatory Visit | Attending: Family Medicine | Admitting: Family Medicine

## 2013-11-09 DIAGNOSIS — E785 Hyperlipidemia, unspecified: Secondary | ICD-10-CM | POA: Insufficient documentation

## 2013-11-09 DIAGNOSIS — F17200 Nicotine dependence, unspecified, uncomplicated: Secondary | ICD-10-CM | POA: Insufficient documentation

## 2013-11-09 DIAGNOSIS — R42 Dizziness and giddiness: Secondary | ICD-10-CM | POA: Insufficient documentation

## 2013-11-09 DIAGNOSIS — R55 Syncope and collapse: Secondary | ICD-10-CM

## 2013-11-09 NOTE — Progress Notes (Signed)
*  PRELIMINARY RESULTS* Vascular Ultrasound Carotid Duplex (Doppler) has been completed.  Preliminary findings: Bilateral:  1-39% ICA stenosis.  Vertebral artery flow is antegrade.      Landry Mellow, RDMS, RVT  11/09/2013, 2:17 PM

## 2013-11-10 ENCOUNTER — Ambulatory Visit: Payer: Medicaid Other | Admitting: Pharmacist

## 2013-11-13 ENCOUNTER — Telehealth: Payer: Self-pay | Admitting: Family Medicine

## 2013-11-13 NOTE — Telephone Encounter (Signed)
Please call the patient and let her know that the carotid dopplers were normal. Thank you.

## 2013-11-14 NOTE — Telephone Encounter (Signed)
Called pt. Informed pt, she verbalized understanding. Javier Glazier, Gerrit Heck

## 2013-11-18 ENCOUNTER — Ambulatory Visit: Payer: Medicaid Other | Admitting: Pharmacist

## 2013-11-20 ENCOUNTER — Emergency Department (HOSPITAL_COMMUNITY): Payer: Medicaid Other

## 2013-11-20 ENCOUNTER — Emergency Department (HOSPITAL_COMMUNITY)
Admission: EM | Admit: 2013-11-20 | Discharge: 2013-11-21 | Disposition: A | Discharge status: Home / Self Care | Payer: Medicaid Other | Attending: Emergency Medicine | Admitting: Emergency Medicine

## 2013-11-20 ENCOUNTER — Encounter (HOSPITAL_COMMUNITY): Payer: Self-pay | Admitting: Emergency Medicine

## 2013-11-20 DIAGNOSIS — Z791 Long term (current) use of non-steroidal anti-inflammatories (NSAID): Secondary | ICD-10-CM | POA: Insufficient documentation

## 2013-11-20 DIAGNOSIS — Z87440 Personal history of urinary (tract) infections: Secondary | ICD-10-CM | POA: Insufficient documentation

## 2013-11-20 DIAGNOSIS — M1990 Unspecified osteoarthritis, unspecified site: Secondary | ICD-10-CM | POA: Insufficient documentation

## 2013-11-20 DIAGNOSIS — Z7982 Long term (current) use of aspirin: Secondary | ICD-10-CM | POA: Insufficient documentation

## 2013-11-20 DIAGNOSIS — J438 Other emphysema: Secondary | ICD-10-CM | POA: Insufficient documentation

## 2013-11-20 DIAGNOSIS — G43909 Migraine, unspecified, not intractable, without status migrainosus: Secondary | ICD-10-CM | POA: Insufficient documentation

## 2013-11-20 DIAGNOSIS — Z9981 Dependence on supplemental oxygen: Secondary | ICD-10-CM | POA: Insufficient documentation

## 2013-11-20 DIAGNOSIS — R519 Headache, unspecified: Secondary | ICD-10-CM

## 2013-11-20 DIAGNOSIS — H409 Unspecified glaucoma: Secondary | ICD-10-CM | POA: Insufficient documentation

## 2013-11-20 DIAGNOSIS — Z86718 Personal history of other venous thrombosis and embolism: Secondary | ICD-10-CM | POA: Insufficient documentation

## 2013-11-20 DIAGNOSIS — Z79899 Other long term (current) drug therapy: Secondary | ICD-10-CM | POA: Insufficient documentation

## 2013-11-20 DIAGNOSIS — R4789 Other speech disturbances: Secondary | ICD-10-CM | POA: Insufficient documentation

## 2013-11-20 DIAGNOSIS — E119 Type 2 diabetes mellitus without complications: Secondary | ICD-10-CM | POA: Insufficient documentation

## 2013-11-20 DIAGNOSIS — F17200 Nicotine dependence, unspecified, uncomplicated: Secondary | ICD-10-CM | POA: Insufficient documentation

## 2013-11-20 DIAGNOSIS — I10 Essential (primary) hypertension: Secondary | ICD-10-CM | POA: Insufficient documentation

## 2013-11-20 DIAGNOSIS — F3289 Other specified depressive episodes: Secondary | ICD-10-CM | POA: Insufficient documentation

## 2013-11-20 DIAGNOSIS — Z880 Allergy status to penicillin: Secondary | ICD-10-CM | POA: Insufficient documentation

## 2013-11-20 DIAGNOSIS — G4733 Obstructive sleep apnea (adult) (pediatric): Secondary | ICD-10-CM | POA: Insufficient documentation

## 2013-11-20 DIAGNOSIS — Z720 Tobacco use: Secondary | ICD-10-CM

## 2013-11-20 DIAGNOSIS — F329 Major depressive disorder, single episode, unspecified: Secondary | ICD-10-CM | POA: Insufficient documentation

## 2013-11-20 DIAGNOSIS — K219 Gastro-esophageal reflux disease without esophagitis: Secondary | ICD-10-CM | POA: Insufficient documentation

## 2013-11-20 DIAGNOSIS — Z8701 Personal history of pneumonia (recurrent): Secondary | ICD-10-CM | POA: Insufficient documentation

## 2013-11-20 DIAGNOSIS — J441 Chronic obstructive pulmonary disease with (acute) exacerbation: Secondary | ICD-10-CM

## 2013-11-20 DIAGNOSIS — R51 Headache: Secondary | ICD-10-CM | POA: Insufficient documentation

## 2013-11-20 DIAGNOSIS — Z85828 Personal history of other malignant neoplasm of skin: Secondary | ICD-10-CM | POA: Insufficient documentation

## 2013-11-20 DIAGNOSIS — G40909 Epilepsy, unspecified, not intractable, without status epilepticus: Secondary | ICD-10-CM | POA: Insufficient documentation

## 2013-11-20 DIAGNOSIS — E785 Hyperlipidemia, unspecified: Secondary | ICD-10-CM | POA: Insufficient documentation

## 2013-11-20 LAB — BASIC METABOLIC PANEL
BUN: 21 mg/dL (ref 6–23)
CHLORIDE: 108 meq/L (ref 96–112)
CO2: 21 mEq/L (ref 19–32)
Calcium: 9 mg/dL (ref 8.4–10.5)
Creatinine, Ser: 0.99 mg/dL (ref 0.50–1.10)
GFR calc non Af Amer: 63 mL/min — ABNORMAL LOW (ref >90)
GFR, EST AFRICAN AMERICAN: 72 mL/min — AB (ref >90)
GLUCOSE: 112 mg/dL — AB (ref 70–99)
Potassium: 3.4 mEq/L — ABNORMAL LOW (ref 3.7–5.3)
Sodium: 146 mEq/L (ref 137–147)

## 2013-11-20 LAB — I-STAT TROPONIN, ED: Troponin i, poc: 0.01 ng/mL (ref 0.00–0.08)

## 2013-11-20 LAB — D-DIMER, QUANTITATIVE: D-Dimer, Quant: 0.82 ug/mL-FEU — ABNORMAL HIGH (ref 0.00–0.48)

## 2013-11-20 LAB — CBC
HEMATOCRIT: 34.5 % — AB (ref 36.0–46.0)
HEMOGLOBIN: 11.7 g/dL — AB (ref 12.0–15.0)
MCH: 30.9 pg (ref 26.0–34.0)
MCHC: 33.9 g/dL (ref 30.0–36.0)
MCV: 91 fL (ref 78.0–100.0)
Platelets: 190 10*3/uL (ref 150–400)
RBC: 3.79 MIL/uL — ABNORMAL LOW (ref 3.87–5.11)
RDW: 14.2 % (ref 11.5–15.5)
WBC: 10 10*3/uL (ref 4.0–10.5)

## 2013-11-20 MED ORDER — IPRATROPIUM-ALBUTEROL 0.5-2.5 (3) MG/3ML IN SOLN
3.0000 mL | Freq: Once | RESPIRATORY_TRACT | Status: AC
  Administered 2013-11-20: 3 mL via RESPIRATORY_TRACT
  Filled 2013-11-20: qty 3

## 2013-11-20 MED ORDER — SODIUM CHLORIDE 0.9 % IV BOLUS (SEPSIS)
1000.0000 mL | Freq: Once | INTRAVENOUS | Status: AC
  Administered 2013-11-20: 1000 mL via INTRAVENOUS

## 2013-11-20 MED ORDER — DIPHENHYDRAMINE HCL 50 MG/ML IJ SOLN
25.0000 mg | Freq: Once | INTRAMUSCULAR | Status: AC
  Administered 2013-11-20: 25 mg via INTRAVENOUS
  Filled 2013-11-20: qty 1

## 2013-11-20 MED ORDER — METOCLOPRAMIDE HCL 5 MG/ML IJ SOLN
10.0000 mg | Freq: Once | INTRAMUSCULAR | Status: AC
  Administered 2013-11-20: 10 mg via INTRAVENOUS
  Filled 2013-11-20: qty 2

## 2013-11-20 MED ORDER — METHYLPREDNISOLONE SODIUM SUCC 125 MG IJ SOLR
125.0000 mg | Freq: Once | INTRAMUSCULAR | Status: AC
  Administered 2013-11-20: 125 mg via INTRAVENOUS
  Filled 2013-11-20: qty 2

## 2013-11-20 MED ORDER — LEVOFLOXACIN 750 MG PO TABS
750.0000 mg | ORAL_TABLET | Freq: Once | ORAL | Status: AC
  Administered 2013-11-20: 750 mg via ORAL
  Filled 2013-11-20: qty 1

## 2013-11-20 NOTE — ED Notes (Signed)
Onset of headache 7 days ago constant 10/10 throbbing alert answering and following commands appropriate. Developed shortness of breath 2-3 days ago EMS reported wheezing patient took own albuterol at home and EMS gave albuterol 5mg  and 0.5 atrovent. States has SOB relief with treatment.

## 2013-11-20 NOTE — ED Notes (Signed)
PT ambulated on RA pulsed OX 92%

## 2013-11-20 NOTE — ED Provider Notes (Signed)
CSN: [id number]     Arrival date & time 11/20/13  1807 History   First MD Initiated Contact with Patient 11/20/13 1824     Chief Complaint  Patient presents with  . Headache  . Shortness of Breath   HPI  [name] is a 58 y.o. female with a PMH of COPD, migraines, type II DM, HTN, hyperlipidemia, GERD, OSA, epilepsy, IBS, esophageal stricture, right leg DVT, depression, and skin cancer who presents to the ED for evaluation of headache and SOB. History was provided by the patient. Patient states she has had a productive cough with yellow sputum for 1 week. Has also had progressively worsening SOB. Has exertional SOB at baseline. Symptoms similar to her bronchitis in the past. Patient denies any chest pain. Had one albuterol treatment at home with no relief, however, received a treatment in the ambulance which improved her symptoms. Patient also has had wheezing. Patient is a tobacco user. Denies home O2 but does have a CPAP machine. She denies any rhinorrhea, congestion, sore throat, and ear pain. She also complains of a 10/10 constant throbbing generalized headache, which has been present for the past week. Headache similar to her migraines in the past. Patient has a neurologist for migraine headaches and "they can't seem to figure out why I get headaches." Patient states she was "tongue tied" earlier but states this resolved. Has had this in the past. Denies any vision changes, loss of sensation, confusion, or facial droop. Also states she felt "weak" in her legs but had no focal weakness. Denies any fevers, chills, change in appetite/activity, falls, or syncope.    Past Medical History  Diagnosis Date  . Allergic rhinitis   . Hypertension   . Hyperlipidemia   . GERD (gastroesophageal reflux disease)   . Glaucoma   . Neck pain     Cervical disc  . IBS (irritable bowel syndrome)   . Esophageal stricture   . Diverticulosis   . Right leg DVT 1982    groin  . COPD (chronic obstructive  pulmonary disease)   . Emphysema   . Pneumonia 2012; 04/27/2012  . Chronic bronchitis     "keep it all the time" (04/27/2012)  . Exertional dyspnea   . Type II diabetes mellitus     No meds since weight loss  . Migraine   . Epilepsy   . DJD (degenerative joint disease)   . Arthritis     "hands and legs and feet" (04/27/2012)  . Depression   . OSA on CPAP     6-7 yrs  . UTI (urinary tract infection)     2 weeks ago  . Cancer 05    rt arm mole rem cancer   Past Surgical History  Procedure Laterality Date  . Tubal ligation  2001  . Knee arthroscopy  1990's    left  . Carpal tunnel release  ~ 2008    right  . Shoulder open rotator cuff repair  ~ 2010    left  . Abdominal hysterectomy  2001    "partial"  . 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 Total knee arthroplasty  08/23/2012    Procedure: TOTAL KNEE ARTHROPLASTY;  Surgeon: Lorn Junes, MD;  Location: [facility];  Service: Orthopedics;  Laterality: Right;  RIGHT KNEE ARTHROPLASTY MEDIAL AND LATERAL COMPARTMENTS WITH PATELLA RESURFACING   Family History  Problem Relation Age of Onset  . Heart disease Maternal Grandfather   . Diabetes Maternal  Grandfather   . Heart disease      uncle  . Breast cancer Maternal Aunt   . Alcohol abuse Father   . Lung cancer Father     was a smoker  . Emphysema Father     was a smoker  . Bipolar disorder Son   . Muscular dystrophy Son   . Diabetes      uncles/aunts  . Heart disease Sister   . Colon cancer Neg Hx   . Lung cancer Mother     was a smoker  . Stroke Mother   . Other Sister     blood clotting disorder  . Heart disease Son    History  Substance Use Topics  . Smoking status: Current Every Day Smoker -- 1.00 packs/day for 36 years    Types: Cigarettes    Start date: 08/12/1975  . Smokeless tobacco: Former Systems developer    Quit date: 08/23/2012     Comment: started back smoking in 11/2012  . Alcohol Use: No     Comment: drinks 2 sodas a day   OB History   Grav Para  Term Preterm Abortions TAB SAB Ect Mult Living                 Review of Systems  Constitutional: Negative for fever, chills, diaphoresis, activity change, appetite change and fatigue.  HENT: Negative for congestion, rhinorrhea and sore throat.   Respiratory: Positive for cough, shortness of breath and wheezing.   Cardiovascular: Negative for chest pain and leg swelling.  Gastrointestinal: Negative for nausea, vomiting, abdominal pain, diarrhea and constipation.  Genitourinary: Negative for dysuria.  Musculoskeletal: Negative for arthralgias and myalgias.  Skin: Negative for rash.  Neurological: Positive for speech difficulty and headaches. Negative for dizziness, syncope, facial asymmetry, weakness (see HPI), light-headedness and numbness.     Allergies  Amoxicillin; Azithromycin; Doxycycline hyclate; Penicillins; Sulfonamide derivatives; Chantix; Clarithromycin; Nortriptyline; and Fluticasone-salmeterol  Home Medications   Current Outpatient Rx  Name  Route  Sig  Dispense  Refill  . acetaminophen (TYLENOL) 325 MG tablet   Oral   Take 2 tablets (650 mg total) by mouth every 6 (six) hours as needed for pain or fever (greater than 101). For pain         . albuterol (PROAIR HFA) 108 (90 BASE) MCG/ACT inhaler   Inhalation   Inhale 2 puffs into the lungs every 6 (six) hours as needed. Shortness of breath   1 Inhaler   3   . albuterol (PROVENTIL) (2.5 MG/3ML) 0.083% nebulizer solution   Nebulization   Take 3 mLs (2.5 mg total) by nebulization every 6 (six) hours as needed. For shortness of breath   75 mL   3   . aspirin EC 81 MG tablet   Oral   Take 1 tablet (81 mg total) by mouth daily.         . Blood Glucose Monitoring Suppl (BLOOD GLUCOSE METER) kit      Use to check blood glucose daily.  Accuchek preffered by patient   1 each   0   . CVS VITAMIN D 2000 UNITS CAPS      TAKE ONE CAPSULE BY MOUTH EVERY DAY   30 capsule   1   . cyclobenzaprine (FLEXERIL) 5 MG  tablet   Oral   Take 1 tablet (5 mg total) by mouth 3 (three) times daily as needed for muscle spasms.   30 tablet   0   . diclofenac (VOLTAREN)  75 MG EC tablet   Oral   Take 1 tablet (75 mg total) by mouth 2 (two) times daily.   30 tablet   0   . ferrous sulfate 325 (65 FE) MG tablet   Oral   Take 1 tablet (325 mg total) by mouth 2 (two) times daily with a meal.   60 tablet   3   . fluticasone (FLONASE) 50 MCG/ACT nasal spray   Each Nare   Place 2 sprays into both nostrils daily as needed. For congestion.   16 g   1   . Glucose Blood (BLOOD GLUCOSE TEST STRIPS) STRP      Use to test blood glucose daily.   100 each   12   . lamoTRIgine (LAMICTAL) 150 MG tablet      Take 1 tablet (150 mg total) by mouth 2 (two) times daily.   60 tablet   2     Refill [id number]   . Loratadine 10 MG CAPS   Oral   Take 1 capsule (10 mg total) by mouth daily.   30 each   5   . nicotine polacrilex (CVS NICOTINE POLACRILEX) 4 MG gum   Oral   Take 1 each (4 mg total) by mouth as needed for smoking cessation (up to 12 pieces daily).   240 tablet   5   . omeprazole (PRILOSEC) 40 MG capsule   Oral   Take 1 capsule (40 mg total) by mouth 2 (two) times daily.   60 capsule   5   . sertraline (ZOLOFT) 50 MG tablet   Oral   Take 1 tablet (50 mg total) by mouth daily.   30 tablet   2   . simvastatin (ZOCOR) 40 MG tablet   Oral   Take 1 tablet (40 mg total) by mouth at bedtime.   30 tablet   3     Refill [id number]   . tiotropium (SPIRIVA) 18 MCG inhalation capsule   Inhalation   Place 1 capsule (18 mcg total) into inhaler and inhale daily.   30 capsule   11   . topiramate (TOPAMAX) 100 MG tablet      1tab in the am, 2 at bedtime   270 tablet   3    BP 119/97  Pulse 93  Temp(Src) 97.6 F (36.4 C) (Axillary)  Resp 20  Ht _0  (1.549 m)  Wt 170 lb (77.111 kg)  BMI 32.14 kg/m2  SpO2 100%  Filed Vitals:   11/20/13 2015 11/20/13 2045 11/20/13 2200 11/21/13 0100  BP:  117/50 109/46 119/61 106/54  Pulse: 103 93 104 104  Temp:      TempSrc:      Resp:    22  Height:      Weight:      SpO2: 98% 93% 94% 93%    Physical Exam  Nursing note and vitals reviewed. Constitutional: She is oriented to person, place, and time. She appears well-developed and well-nourished. No distress.  Non-toxic  HENT:  Head: Normocephalic and atraumatic.  Right Ear: External ear normal.  Left Ear: External ear normal.  Nose: Nose normal.  Mouth/Throat: Oropharynx is clear and moist.  Eyes: Conjunctivae are normal. Right eye exhibits no discharge. Left eye exhibits no discharge.  Neck: Normal range of motion. Neck supple.  Cardiovascular: Normal rate, regular rhythm and normal heart sounds.  Exam reveals no gallop and no friction rub.   No murmur heard. Pulmonary/Chest: She is in respiratory distress.  She has wheezes. She has no rales. She exhibits no tenderness.  Inspiratory and expiratory wheezing throughout. Poor air movement. Mild respiratory distress. Patient able to speak in full sentences. Harsh cough.   Abdominal: Soft. She exhibits no distension. There is no tenderness.  Musculoskeletal: Normal range of motion. She exhibits no edema and no tenderness.  Strength 5/5 in the upper and lower extremities bilaterally. Patient able to ambulate without difficulty or ataxia.No LE edema or calf tenderness bilaterally.   Neurological: She is alert and oriented to person, place, and time.  GCS 15. No focal neurological deficits. No facial droop. CN 2-12 intact. No pronator drift. Finger to nose intact.  Heel to shin intact.    Skin: Skin is warm and dry. She is not diaphoretic.     ED Course  Procedures (including critical care time) Labs Review Labs Reviewed  BASIC METABOLIC PANEL  CBC  I-STAT [place], ED   Imaging Review Dg Chest 2 View (if Patient Has Fever And/or Copd)  11/20/2013   CLINICAL DATA:  Headache cough congestion shortness of breath chest tightness,  smoker  EXAM: CHEST  2 VIEW  COMPARISON:  CT ANGIO CHEST W/CM &/OR WO/CM dated 08/06/2013; DG CHEST 1V PORT dated 08/06/2013  FINDINGS: Heart size and vascular pattern are normal. Mild perihilar bronchial wall thickening and mild background interstitial prominence. No consolidation or effusion.  IMPRESSION: Stable mild chronic bronchitic change.  No acute findings.   Electronically Signed   By: Skipper Cliche M.D.   On: 11/20/2013 19:14   Ct Head Wo Contrast  11/20/2013   CLINICAL DATA:  Headache for 7 days  EXAM: CT HEAD WITHOUT CONTRAST  TECHNIQUE: Contiguous axial images were obtained from the base of the skull through the vertex without intravenous contrast.  COMPARISON:  CT HEAD W/O CM dated 08/06/2013  FINDINGS: There is no evidence of mass effect, midline shift or extra-axial fluid collections. There is no evidence of a space-occupying lesion or intracranial hemorrhage. There is no evidence of a cortical-based area of acute infarction.  The ventricles and sulci are appropriate for the patient's age. The basal cisterns are patent.  Visualized portions of the orbits are unremarkable. There is a small air-fluid level and mucosal thickening of the right maxillary sinus.  The osseous structures are unremarkable.  IMPRESSION: No acute intracranial pathology.  Right maxillary sinus disease.   Electronically Signed   By: Kathreen Devoid   On: 11/20/2013 23:33   Ct Angio Chest Pe W/cm &/or Wo Cm  11/21/2013   CLINICAL DATA:  Shortness of breath and wheezing.  EXAM: CT ANGIOGRAPHY CHEST WITH CONTRAST  TECHNIQUE: Multidetector CT imaging of the chest was performed using the standard protocol during bolus administration of intravenous contrast. Multiplanar CT image reconstructions and MIPs were obtained to evaluate the vascular anatomy.  CONTRAST:  115m OMNIPAQUE IOHEXOL 350 MG/ML SOLN  COMPARISON:  None.  FINDINGS: The pulmonary arteries are well opacified. There is no evidence of pulmonary embolism. The thoracic  aorta is of normal caliber. Lung windows show patchy reticulonodular opacities in both lungs which are largely chronic when reviewing prior CT studies and consistent with chronic lung disease. Some of the more geographic ground-glass opacities seen on the previous CT have resolved. No definite focal infiltrate or edema is identified. There is no evidence of pleural or pericardial fluid.  The heart size is normal. No lymphadenopathy is identified. The visualized upper abdomen is unremarkable. Bony structures are within normal limits.  Review of the MIP  images confirms the above findings.  IMPRESSION: No acute findings. Chronic lung disease with patchy reticulonodular opacities present in both lungs.   Electronically Signed   By: Aletta Edouard M.D.   On: 11/21/2013 00:55     EKG Interpretation   Date/Time:  Sunday November 20 2013 18:16:06 EDT Ventricular Rate:  90 PR Interval:  180 QRS Duration: 75 QT Interval:  331 QTC Calculation: 405 R Axis:   72 Text Interpretation:  Sinus rhythm Probable left atrial enlargement Low  voltage, precordial leads No significant change was found Confirmed by  Wyvonnia Dusky  MD, STEPHEN [phone]) on 11/20/2013 6:36:11 PM      Results for orders placed during the hospital encounter of 42/68/34  BASIC METABOLIC PANEL      Result Value Ref Range   Sodium 146  137 - 147 mEq/L   Potassium 3.4 (*) 3.7 - 5.3 mEq/L   Chloride 108  96 - 112 mEq/L   CO2 21  19 - 32 mEq/L   Glucose, Bld 112 (*) 70 - 99 mg/dL   BUN 21  6 - 23 mg/dL   Creatinine, Ser 0.99  0.50 - 1.10 mg/dL   Calcium 9.0  8.4 - 10.5 mg/dL   GFR calc non Af Amer 63 (*) >90 mL/min   GFR calc Af Amer 72 (*) >90 mL/min  CBC      Result Value Ref Range   WBC 10.0  4.0 - 10.5 K/uL   RBC 3.79 (*) 3.87 - 5.11 MIL/uL   Hemoglobin 11.7 (*) 12.0 - 15.0 g/dL   HCT 34.5 (*) 36.0 - 46.0 %   MCV 91.0  78.0 - 100.0 fL   MCH 30.9  26.0 - 34.0 pg   MCHC 33.9  30.0 - 36.0 g/dL   RDW 14.2  11.5 - 15.5 %   Platelets 190   150 - 400 K/uL  D-DIMER, QUANTITATIVE      Result Value Ref Range   D-Dimer, Quant 0.82 (*) 0.00 - 0.48 ug/mL-FEU  I-STAT [place], ED      Result Value Ref Range   Troponin i, poc 0.01  0.00 - 0.08 ng/mL   Comment 3              MDM   [name] is a 58 y.o. female with a PMH of COPD, migraines, type II DM, HTN, hyperlipidemia, GERD, OSA, epilepsy, IBS, esophageal stricture, right leg DVT, depression, and skin cancer who presents to the ED for evaluation of headache and SOB  Rechecks  7:45 PM = Mild improvement in wheezing. Patient states she feels better. Ordering another Duoneb 9:00 PM = Headache not improved after IV fluids. Ordering Benadryl and Reglan. Another Duoneb.  10:00 PM = Pulse ox with ambulation 92%  1:15 AM = Patient ambulating hallway without difficulty. Lungs with intermittent scattered wheezing. Significantly improved. Intermittent cough. States would like to be discharged. Encouraged to stop smoking. Patient states headache improved but is still present.    Etiology of SOB and wheezing likely due to a COPD exacerbation. Patient had improvements in her condition with Duoneb x 3 and Solumedrol. Chest x-ray negative for an acute cardiopulmonary process. No chest pain. EKG negative for any acute ischemic changes. Troponin negative. D-dimer elevated with CT angio negative for PE. Labs unremarkable. Patient given dose of antibiotics in the ED. Vital signs stable for discharge. Patient encouraged to stop smoking and follow-up with PCP this week. Patient also had headache similar to migraines in the past.  Head CT negative. No focal neurological deficits. Has follow-up with neurologist who she sees for migraines. Headache improved throughout ED visit. Return precautions, discharge instructions, and follow-up was discussed with the patient before discharge.     Discharge Medication List as of 11/21/2013  1:27 AM    START taking these medications   Details  levofloxacin  (LEVAQUIN) 750 MG tablet Take 1 tablet (750 mg total) by mouth daily., Starting 11/21/2013, Until Discontinued, Print    predniSONE (DELTASONE) 20 MG tablet Take 2 tablets (40 mg total) by mouth daily., Starting 11/21/2013, Until Discontinued, Print         Final impressions: 1. COPD exacerbation   2. Headache   3. Tobacco abuse       Mercy Moore PA-C   This patient was discussed with Dr. Dionicio Stall, PA-C 11/21/13 1424

## 2013-11-20 NOTE — ED Notes (Signed)
Call CT after breathing treatment they will come back for her.

## 2013-11-21 ENCOUNTER — Encounter (HOSPITAL_COMMUNITY): Payer: Self-pay | Admitting: Radiology

## 2013-11-21 ENCOUNTER — Emergency Department (HOSPITAL_COMMUNITY): Payer: Medicaid Other

## 2013-11-21 MED ORDER — IOHEXOL 350 MG/ML SOLN
100.0000 mL | Freq: Once | INTRAVENOUS | Status: AC | PRN
  Administered 2013-11-21: 100 mL via INTRAVENOUS

## 2013-11-21 MED ORDER — PREDNISONE 20 MG PO TABS: 40.0000 mg | ORAL_TABLET | Freq: Every day | ORAL | Status: DC

## 2013-11-21 MED ORDER — LEVOFLOXACIN 750 MG PO TABS: 750.0000 mg | ORAL_TABLET | Freq: Every day | ORAL | Status: DC

## 2013-11-21 NOTE — Discharge Instructions (Signed)
Continue albuterol treatments at home Take prednisone and antibiotics and followup with your primary doctor Avoid tobacco use  Drink plenty of fluids and rest Return to the emergency department if you develop any changing/worsening condition, confusion, difficulty talking or walking, difficulty breathing, coughing up blood, fever, weakness, or any other concerns (please read additional information regarding your condition below)     Chronic Obstructive Pulmonary Disease Chronic obstructive pulmonary disease (COPD) is a common lung condition in which airflow from the lungs is limited. COPD is a general term that can be used to describe many different lung problems that limit airflow, including both chronic bronchitis and emphysema. If you have COPD, your lung function will probably never return to normal, but there are measures you can take to improve lung function and make yourself feel better.  CAUSES   Smoking (common).   Exposure to secondhand smoke.   Genetic problems.  Chronic inflammatory lung diseases or recurrent infections. SYMPTOMS   Shortness of breath, especially with physical activity.   Deep, persistent (chronic) cough with a large amount of thick mucus.   Wheezing.   Rapid breaths (tachypnea).   Gray or bluish discoloration (cyanosis) of the skin, especially in fingers, toes, or lips.   Fatigue.   Weight loss.   Frequent infections or episodes when breathing symptoms become much worse (exacerbations).   Chest tightness. DIAGNOSIS  Your healthcare provider will take a medical history and perform a physical examination to make the initial diagnosis. Additional tests for COPD may include:   Lung (pulmonary) function tests.  Chest X-ray.  CT scan.  Blood tests. TREATMENT  Treatment available to help you feel better when you have COPD include:   Inhaler and nebulizer medicines. These help manage the symptoms of COPD and make your breathing  more comfortable  Supplemental oxygen. Supplemental oxygen is only helpful if you have a low oxygen level in your blood.   Exercise and physical activity. These are beneficial for nearly all people with COPD. Some people may also benefit from a pulmonary rehabilitation program. HOME CARE INSTRUCTIONS   Take all medicines (inhaled or pills) as directed by your health care provider.  Only take over-the-counter or prescription medicines for pain, fever, or discomfort as directed by your health care provider.   Avoid over-the-counter medicines or cough syrups that dry up your airway (such as antihistamines) and slow down the elimination of secretions unless instructed otherwise by your healthcare provider.   If you are a smoker, the most important thing that you can do is stop smoking. Continuing to smoke will cause further lung damage and breathing trouble. Ask your health care provider for help with quitting smoking. He or she can direct you to community resources or hospitals that provide support.  Avoid exposure to irritants such as smoke, chemicals, and fumes that aggravate your breathing.  Use oxygen therapy and pulmonary rehabilitation if directed by your health care provider. If you require home oxygen therapy, ask your healthcare provider whether you should purchase a pulse oximeter to measure your oxygen level at home.   Avoid contact with individuals who have a contagious illness.  Avoid extreme temperature and humidity changes.  Eat healthy foods. Eating smaller, more frequent meals and resting before meals may help you maintain your strength.  Stay active, but balance activity with periods of rest. Exercise and physical activity will help you maintain your ability to do things you want to do.  Preventing infection and hospitalization is very important when you have  COPD. Make sure to receive all the vaccines your health care provider recommends, especially the pneumococcal and  influenza vaccines. Ask your healthcare provider whether you need a pneumonia vaccine.  Learn and use relaxation techniques to manage stress.  Learn and use controlled breathing techniques as directed by your health care provider. Controlled breathing techniques include:   Pursed lip breathing. Start by breathing in (inhaling) through your nose for 1 second. Then, purse your lips as if you were going to whistle and breathe out (exhale) through the pursed lips for 2 seconds.   Diaphragmatic breathing. Start by putting one hand on your abdomen just above your waist. Inhale slowly through your nose. The hand on your abdomen should move out. Then purse your lips and exhale slowly. You should be able to feel the hand on your abdomen moving in as you exhale.   Learn and use controlled coughing to clear mucus from your lungs. Controlled coughing is a series of short, progressive coughs. The steps of controlled coughing are:   Lean your head slightly forward.   Breathe in deeply using diaphragmatic breathing.   Try to hold your breath for 3 seconds.   Keep your mouth slightly open while coughing twice.   Spit any mucus out into a tissue.   Rest and repeat the steps once or twice as needed. SEEK MEDICAL CARE IF:   You are coughing up more mucus than usual.   There is a change in the color or thickness of your mucus.   Your breathing is more labored than usual.   Your breathing is faster than usual.  SEEK IMMEDIATE MEDICAL CARE IF:   You have shortness of breath while you are resting.   You have shortness of breath that prevents you from:  Being able to talk.   Performing your usual physical activities.   You have chest pain lasting longer than 5 minutes.   Your skin color is more cyanotic than usual.  You measure low oxygen saturations for longer than 5 minutes with a pulse oximeter. MAKE SURE YOU:   Understand these instructions.  Will watch your  condition.  Will get help right away if you are not doing well or get worse. Document Released: 05/07/2005 Document Revised: 05/18/2013 Document Reviewed: 03/24/2013 [facility] Patient Information 2014 [place], [place].   Bronchitis Bronchitis is inflammation of the airways that extend from the windpipe into the lungs (bronchi). The inflammation often causes mucus to develop, which leads to a cough. If the inflammation becomes severe, it may cause shortness of breath. CAUSES  Bronchitis may be caused by:   Viral infections.   Bacteria.   Cigarette smoke.   Allergens, pollutants, and other irritants.  SIGNS AND SYMPTOMS  The most common symptom of bronchitis is a frequent cough that produces mucus. Other symptoms include:  Fever.   Body aches.   Chest congestion.   Chills.   Shortness of breath.   Sore throat.  DIAGNOSIS  Bronchitis is usually diagnosed through a medical history and physical exam. Tests, such as chest X-rays, are sometimes done to rule out other conditions.  TREATMENT  You may need to avoid contact with whatever caused the problem (smoking, for example). Medicines are sometimes needed. These may include:  Antibiotics. These may be prescribed if the condition is caused by bacteria.  Cough suppressants. These may be prescribed for relief of cough symptoms.   Inhaled medicines. These may be prescribed to help open your airways and make it easier for you  to breathe.   Steroid medicines. These may be prescribed for those with recurrent (chronic) bronchitis. HOME CARE INSTRUCTIONS  Get plenty of rest.   Drink enough fluids to keep your urine clear or pale yellow (unless you have a medical condition that requires fluid restriction). Increasing fluids may help thin your secretions and will prevent dehydration.   Only take over-the-counter or prescription medicines as directed by your health care provider.  Only take antibiotics as directed. Make  sure you finish them even if you start to feel better.  Avoid secondhand smoke, irritating chemicals, and strong fumes. These will make bronchitis worse. If you are a smoker, quit smoking. Consider using nicotine gum or skin patches to help control withdrawal symptoms. Quitting smoking will help your lungs heal faster.   Put a cool-mist humidifier in your bedroom at night to moisten the air. This may help loosen mucus. Change the water in the humidifier daily. You can also run the hot water in your shower and sit in the bathroom with the door closed for 5 10 minutes.   Follow up with your health care provider as directed.   Wash your hands frequently to avoid catching bronchitis again or spreading an infection to others.  SEEK MEDICAL CARE IF: Your symptoms do not improve after 1 week of treatment.  SEEK IMMEDIATE MEDICAL CARE IF:  Your fever increases.  You have chills.   You have chest pain.   You have worsening shortness of breath.   You have bloody sputum.  You faint.  You have lightheadedness.  You have a severe headache.   You vomit repeatedly. MAKE SURE YOU:   Understand these instructions.  Will watch your condition.  Will get help right away if you are not doing well or get worse. Document Released: 07/28/2005 Document Revised: 05/18/2013 Document Reviewed: 03/22/2013 [facility] Patient Information 2014 [place].  Migraine Headache A migraine headache is an intense, throbbing pain on one or both sides of your head. A migraine can last for 30 minutes to several hours. CAUSES  The exact cause of a migraine headache is not always known. However, a migraine may be caused when nerves in the brain become irritated and release chemicals that cause inflammation. This causes pain. Certain things may also trigger migraines, such as: Alcohol. Smoking. Stress. Menstruation. Aged cheeses. Foods or drinks that contain nitrates, glutamate, aspartame, or  tyramine. Lack of sleep. Chocolate. Caffeine. Hunger. Physical exertion. Fatigue. Medicines used to treat chest pain (nitroglycerine), birth control pills, estrogen, and some blood pressure medicines. SIGNS AND SYMPTOMS Pain on one or both sides of your head. Pulsating or throbbing pain. Severe pain that prevents daily activities. Pain that is aggravated by any physical activity. Nausea, vomiting, or both. Dizziness. Pain with exposure to bright lights, loud noises, or activity. General sensitivity to bright lights, loud noises, or smells. Before you get a migraine, you may get warning signs that a migraine is coming (aura). An aura may include: Seeing flashing lights. Seeing bright spots, halos, or zig-zag lines. Having tunnel vision or blurred vision. Having feelings of numbness or tingling. Having trouble talking. Having muscle weakness. DIAGNOSIS  A migraine headache is often diagnosed based on: Symptoms. Physical exam. A CT scan or MRI of your head. These imaging tests cannot diagnose migraines, but they can help rule out other causes of headaches. TREATMENT Medicines may be given for pain and nausea. Medicines can also be given to help prevent recurrent migraines.  HOME CARE INSTRUCTIONS Only take over-the-counter  or prescription medicines for pain or discomfort as directed by your health care provider. The use of long-term narcotics is not recommended. Lie down in a dark, quiet room when you have a migraine. Keep a journal to find out what may trigger your migraine headaches. For example, write down: What you eat and drink. How much sleep you get. Any change to your diet or medicines. Limit alcohol consumption. Quit smoking if you smoke. Get 7 9 hours of sleep, or as recommended by your health care provider. Limit stress. Keep lights dim if bright lights bother you and make your migraines worse. SEEK IMMEDIATE MEDICAL CARE IF:  Your migraine becomes severe. You  have a fever. You have a stiff neck. You have vision loss. You have muscular weakness or loss of muscle control. You start losing your balance or have trouble walking. You feel faint or pass out. You have severe symptoms that are different from your first symptoms. MAKE SURE YOU:  Understand these instructions. Will watch your condition. Will get help right away if you are not doing well or get worse. Document Released: 07/28/2005 Document Revised: 05/18/2013 Document Reviewed: 04/04/2013 [facility] Patient Information 2014 [place].

## 2013-11-22 NOTE — ED Provider Notes (Signed)
Medical screening examination/treatment/procedure(s) were conducted as a shared visit with non-physician practitioner(s) and myself.  I personally evaluated the patient during the encounter.  3 days of productive cough, wheezing, SOB.  No chest pain.  Also 1 week of gradual onset typical migraine headache.  Speaking in full sentences, insp and exp wheezing throughout. CN 2-12 intact, no ataxia on finger to nose, no nystagmus, 5/5 strength throughout, no pronator drift, Romberg negative, normal gait.    EKG Interpretation   Date/Time:  Sunday November 20 2013 18:16:06 EDT Ventricular Rate:  90 PR Interval:  180 QRS Duration: 75 QT Interval:  331 QTC Calculation: 405 R Axis:   72 Text Interpretation:  Sinus rhythm Probable left atrial enlargement Low  voltage, precordial leads No significant change was found Confirmed by  Wyvonnia Dusky  MD, Hlee Fringer [phone]) on 11/20/2013 6:36:11 PM        Ezequiel Essex, MD 11/22/13 1504

## 2013-11-24 ENCOUNTER — Encounter: Payer: Self-pay | Admitting: Nurse Practitioner

## 2013-11-24 ENCOUNTER — Ambulatory Visit (INDEPENDENT_AMBULATORY_CARE_PROVIDER_SITE_OTHER): Payer: Medicaid Other | Admitting: Nurse Practitioner

## 2013-11-24 VITALS — BP 125/80 | HR 69 | Ht 61.0 in | Wt 182.0 lb

## 2013-11-24 DIAGNOSIS — G43019 Migraine without aura, intractable, without status migrainosus: Secondary | ICD-10-CM

## 2013-11-24 DIAGNOSIS — R569 Unspecified convulsions: Secondary | ICD-10-CM

## 2013-11-24 MED ORDER — TOPIRAMATE 100 MG PO TABS
ORAL_TABLET | ORAL | Status: DC
Start: 2013-11-24 — End: 2014-11-17

## 2013-11-24 MED ORDER — LAMOTRIGINE 150 MG PO TABS
ORAL_TABLET | ORAL | Status: DC
Start: 2013-11-24 — End: 2014-09-29

## 2013-11-24 NOTE — Progress Notes (Signed)
I have read the note, and I agree with the clinical assessment and plan.  Leily Capek K Lexys Milliner   

## 2013-11-24 NOTE — Patient Instructions (Addendum)
Continue Topamax and Lamictal at current doses Will refill with 3 months supply Followup in 6 months

## 2013-11-24 NOTE — Progress Notes (Signed)
[facility]  PATIENT: [name] DOB: Oct 28, 1955   REASON FOR VISIT: follow up migraine, history of seizure    HISTORY OF PRESENT ILLNESS: Ms. Budden, 58 year old female returns for followup. She has along history of headaches. She also has sleep apnea and uses CPAP. Urgent headaches are generalized in nature along some chronic stiff neck pain she continues to smoke but is trying to quit after having an admission to the hospital for respiratory failure. She has a remote history of seizures with no active seizure activity in many years. She is currently being treated for a sinus infection. She returns for reevaluation   HISTORY: She has a long history of chronic daily headaches. She also has sleep apnea and uses CPAP which is helped somewhat with her early morning headaches. Her headaches are generalized in nature along with some chronic neck discomfort and neck stiffness. Gabapentin has not been helpful in the past. She is currently on Topamax and Seroquel. She continues to smoke a pack a day and was encouraged to quit. She reports that she takes 6-8 Tylenol a day and was made aware that it can cause rebound headaches.  He has had some dizziness and the doctor as I would not prescribe this medication for the study thank you ches. She states that her headaches have really not changed. She takes the preventative medication as prescribed, but she doesn't think they have made much difference. She does sleep better with Seroquel though. She takes ibuprofen for her headaches nearly every day. When asked about readiness to stop smoking, she states "I have too much stress already to try to stop smoking." She is not able to take triptans.    REVIEW OF SYSTEMS: Full 14 system review of systems performed and notable only for those listed, all others are neg:  Constitutional: N/A  Cardiovascular: N/A  Ear/Nose/Throat: ringing in the ears Skin: N/A  Eyes: N/A  Respiratory: cough, SOB,  wheezing Gastroitestinal: N/A  Hematology/Lymphatic: N/A  Endocrine: N/A Musculoskeletal: Joint pain, back pain Allergy/Immunology: N/A  Neurological: History of seizure Psychiatric: Anxiety Sleep obstructive sleep apnea   ALLERGIES: Allergies  Allergen Reactions  . Amoxicillin Anaphylaxis    Breakout, mouth swells  . Azithromycin Swelling and Rash    "swelling all over, high, thrush"  . Doxycycline Hyclate Anaphylaxis and Rash    swelling  . Penicillins Shortness Of Breath and Rash    "almost died, ended up in hospital"  . Sulfonamide Derivatives Shortness Of Breath, Swelling and Rash    "break out from head to toe"  . Chantix [Varenicline] Other (See Comments)    "bad dreams, difficulty breathing"  . Clarithromycin Hives    swelling  . Nortriptyline Other (See Comments)    Per patient had "kidney problems" on this medication, could not use bathroom (urinary retention), swelling and breakout  . Fluticasone-Salmeterol Other (See Comments)    REACTION: ulcers in mouth    HOME MEDICATIONS: Outpatient Prescriptions Prior to Visit  Medication Sig Dispense Refill  . acetaminophen (TYLENOL) 325 MG tablet Take 2 tablets (650 mg total) by mouth every 6 (six) hours as needed for pain or fever (greater than 101). For pain      . albuterol (PROAIR HFA) 108 (90 BASE) MCG/ACT inhaler Inhale 2 puffs into the lungs every 6 (six) hours as needed. Shortness of breath  1 Inhaler  3  . albuterol (PROVENTIL) (2.5 MG/3ML) 0.083% nebulizer solution Take 3 mLs (2.5 mg total) by nebulization every 6 (six)  hours as needed. For shortness of breath  75 mL  3  . aspirin EC 81 MG tablet Take 1 tablet (81 mg total) by mouth daily.      . Blood Glucose Monitoring Suppl (BLOOD GLUCOSE METER) kit Use to check blood glucose daily.  Accuchek preffered by patient  1 each  0  . [employer]S VITAMIN D 2000 UNITS CAPS TAKE ONE CAPSULE BY MOUTH EVERY DAY  30 capsule  1  . cyclobenzaprine (FLEXERIL) 5 MG tablet Take 1  tablet (5 mg total) by mouth 3 (three) times daily as needed for muscle spasms.  30 tablet  0  . diclofenac (VOLTAREN) 75 MG EC tablet Take 1 tablet (75 mg total) by mouth 2 (two) times daily.  30 tablet  0  . ferrous sulfate 325 (65 FE) MG tablet Take 1 tablet (325 mg total) by mouth 2 (two) times daily with a meal.  60 tablet  3  . fluticasone (FLONASE) 50 MCG/ACT nasal spray Place 2 sprays into both nostrils daily as needed. For congestion.  16 g  1  . Glucose Blood (BLOOD GLUCOSE TEST STRIPS) STRP Use to test blood glucose daily.  100 each  12  . lamoTRIgine (LAMICTAL) 150 MG tablet Take 1 tablet (150 mg total) by mouth 2 (two) times daily.  60 tablet  2  . levofloxacin (LEVAQUIN) 750 MG tablet Take 1 tablet (750 mg total) by mouth daily.  7 tablet  0  . Loratadine 10 MG CAPS Take 1 capsule (10 mg total) by mouth daily.  30 each  5  . nicotine polacrilex (CVS NICOTINE POLACRILEX) 4 MG gum Take 1 each (4 mg total) by mouth as needed for smoking cessation (up to 12 pieces daily).  240 tablet  5  . omeprazole (PRILOSEC) 40 MG capsule Take 1 capsule (40 mg total) by mouth 2 (two) times daily.  60 capsule  5  . predniSONE (DELTASONE) 20 MG tablet Take 2 tablets (40 mg total) by mouth daily.  10 tablet  0  . sertraline (ZOLOFT) 50 MG tablet Take 1 tablet (50 mg total) by mouth daily.  30 tablet  2  . simvastatin (ZOCOR) 40 MG tablet Take 1 tablet (40 mg total) by mouth at bedtime.  30 tablet  3  . tiotropium (SPIRIVA) 18 MCG inhalation capsule Place 1 capsule (18 mcg total) into inhaler and inhale daily.  30 capsule  11  . topiramate (TOPAMAX) 100 MG tablet 1tab in the am, 2 at bedtime  270 tablet  3   No facility-administered medications prior to visit.    PAST MEDICAL HISTORY: Past Medical History  Diagnosis Date  . Allergic rhinitis   . Hypertension   . Hyperlipidemia   . GERD (gastroesophageal reflux disease)   . Glaucoma   . Neck pain     Cervical disc  . IBS (irritable bowel  syndrome)   . Esophageal stricture   . Diverticulosis   . Right leg DVT 1982    groin  . COPD (chronic obstructive pulmonary disease)   . Emphysema   . Pneumonia 2012; 04/27/2012  . Chronic bronchitis     "keep it all the time" (04/27/2012)  . Exertional dyspnea   . Type II diabetes mellitus     No meds since weight loss  . Migraine   . Epilepsy   . DJD (degenerative joint disease)   . Arthritis     "hands and legs and feet" (04/27/2012)  . Depression   .  OSA on CPAP     6-7 yrs  . UTI (urinary tract infection)     2 weeks ago  . Cancer 05    rt arm mole rem cancer    PAST SURGICAL HISTORY: Past Surgical History  Procedure Laterality Date  . Tubal ligation  2001  . Knee arthroscopy  1990's    left  . Carpal tunnel release  ~ 2008    right  . Shoulder open rotator cuff repair  ~ 2010    left  . Abdominal hysterectomy  2001    "partial"  . 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 Total knee arthroplasty  08/23/2012    Procedure: TOTAL KNEE ARTHROPLASTY;  Surgeon: Lorn Junes, MD;  Location: [facility];  Service: Orthopedics;  Laterality: Right;  RIGHT KNEE ARTHROPLASTY MEDIAL AND LATERAL COMPARTMENTS WITH PATELLA RESURFACING    FAMILY HISTORY: Family History  Problem Relation Age of Onset  . Heart disease Maternal Grandfather   . Diabetes Maternal Grandfather   . Heart disease      uncle  . Breast cancer Maternal Aunt   . Alcohol abuse Father   . Lung cancer Father     was a smoker  . Emphysema Father     was a smoker  . Bipolar disorder Son   . Muscular dystrophy Son   . Diabetes      uncles/aunts  . Heart disease Sister   . Colon cancer Neg Hx   . Lung cancer Mother     was a smoker  . Stroke Mother   . Other Sister     blood clotting disorder  . Heart disease Son     SOCIAL HISTORY: History   Social History  . Marital Status: Married    Spouse Name: [name]    Number of Children: 2  . Years of Education: 12   Occupational History  .  disabled    Social History Main Topics  . Smoking status: Current Every Day Smoker -- 1.00 packs/day for 36 years    Types: Cigarettes    Start date: 08/12/1975  . Smokeless tobacco: Former Systems developer    Quit date: 08/23/2012     Comment: started back smoking in 11/2012  . Alcohol Use: No     Comment: drinks 2 sodas a day  . Drug Use: No  . Sexual Activity: No   Other Topics Concern  . Not on file   Social History Narrative   Patient lives at home with husband [name], her son and his wife.    Patient has 2 children.    Patient has a high school education.    Patient is currently unemployed.    Patient is right handed.            PHYSICAL EXAM  Filed Vitals:   11/24/13 1333  BP: 125/80  Pulse: 69  Height: '5\' 1"'  (1.549 m)  Weight: 182 lb (82.555 kg)   Body mass index is 34.41 kg/(m^2).  Generalized: Well developed, obese female in no acute distress  Head: normocephalic and atraumatic,. Oropharynx benign  Neck: Supple, no carotid bruits  Cardiac: Regular rate rhythm, no murmur  Musculoskeletal: No deformity   Neurological examination   Mentation: Alert oriented to time, place, history taking. Follows all commands speech and language fluent  Cranial nerve II-XII: Pupils were equal round reactive to light extraocular movements were full, visual field were full on confrontational test. Facial sensation and strength were normal.  hearing was intact to finger rubbing bilaterally. Uvula tongue midline. head turning and shoulder shrug were normal and symmetric.Tongue protrusion into cheek strength was normal. Motor: normal bulk and tone, full strength in the BUE, BLE, fine finger movements normal, no pronator drift. No focal weakness Sensory: normal and symmetric to light touch, pinprick, and  vibration  Coordination: finger-nose-finger, heel-to-shin bilaterally, no dysmetria Reflexes: Brachioradialis 2/2, biceps 2/2, triceps 2/2, patellar 2/2, Achilles 2/2, plantar responses were  flexor bilaterally. Gait and Station: Rising up from seated position without assistance, normal stance,  moderate stride, good arm swing, smooth turning, able to perform tiptoe, and heel walking without difficulty. Tandem gait is unsteady. Patient is using a rolling walker  DIAGNOSTIC DATA (LABS, IMAGING, TESTING) - I reviewed patient records, labs, notes, testing and imaging myself where available.  Lab Results  Component Value Date   WBC 10.0 11/20/2013   HGB 11.7* 11/20/2013   HCT 34.5* 11/20/2013   MCV 91.0 11/20/2013   PLT 190 11/20/2013      Component Value Date/Time   NA 146 11/20/2013 1846   K 3.4* 11/20/2013 1846   CL 108 11/20/2013 1846   CO2 21 11/20/2013 1846   GLUCOSE 112* 11/20/2013 1846   BUN 21 11/20/2013 1846   CREATININE 0.99 11/20/2013 1846   CREATININE 0.91 08/17/2013 1629   CALCIUM 9.0 11/20/2013 1846   PROT 6.8 08/17/2013 1629   ALBUMIN 3.7 08/17/2013 1629   AST 11 08/17/2013 1629   ALT 8 08/17/2013 1629   ALKPHOS 92 08/17/2013 1629   BILITOT 0.2* 08/17/2013 1629   GFRNONAA 63* 11/20/2013 1846   GFRAA 72* 11/20/2013 1846    Lab Results  Component Value Date   HGBA1C 5.4 10/05/2013    Lab Results  Component Value Date   TSH 0.719 08/07/2013      ASSESSMENT AND PLAN  58 y.o. year old female  has a past medical history of Hypertension; Hyperlipidemia;  COPD (chronic obstructive pulmonary disease); Emphysema;  Migraine; Epilepsy; DJD (degenerative joint disease); Arthritis; Depression; OSA on CPAP; here to follow up.   Continue Topamax and Lamictal at current doses Will refill with 3 months supply Followup in 6 months Dennie Bible, [facility], [facility], APRN  [facility] [address] [place], [place] [address] [phone]

## 2013-12-07 ENCOUNTER — Telehealth: Payer: Self-pay | Admitting: Family Medicine

## 2013-12-07 NOTE — Telephone Encounter (Signed)
Spoke with patient.  Request is for Claritin to her mail order pharmacy, [facility].  Will fwd request to MD for approval.  Lazaro Arms, CMA

## 2013-12-07 NOTE — Telephone Encounter (Signed)
Needs allergy medicine called in

## 2013-12-13 MED ORDER — LORATADINE 10 MG PO CAPS: 10.0000 mg | ORAL_CAPSULE | Freq: Every day | ORAL | Status: DC

## 2013-12-13 NOTE — Telephone Encounter (Signed)
Pt notified and will schedule appt.   [place]

## 2013-12-13 NOTE — Telephone Encounter (Signed)
Rx sent in. Please inform pt that she needs to schedule an appointment with me to f/u on her chronic medical problems. Thanks! Leeanne Rio, MD

## 2013-12-14 ENCOUNTER — Ambulatory Visit: Payer: Medicaid Other | Admitting: Emergency Medicine

## 2013-12-14 ENCOUNTER — Other Ambulatory Visit: Payer: Self-pay | Admitting: *Deleted

## 2013-12-14 DIAGNOSIS — F410 Panic disorder [episodic paroxysmal anxiety] without agoraphobia: Secondary | ICD-10-CM

## 2013-12-19 MED ORDER — SERTRALINE HCL 50 MG PO TABS
50.0000 mg | ORAL_TABLET | Freq: Every day | ORAL | Status: DC
Start: ? — End: 2014-02-21

## 2013-12-22 ENCOUNTER — Other Ambulatory Visit: Payer: Self-pay | Admitting: *Deleted

## 2013-12-22 DIAGNOSIS — J441 Chronic obstructive pulmonary disease with (acute) exacerbation: Secondary | ICD-10-CM

## 2013-12-23 MED ORDER — FLUTICASONE PROPIONATE 50 MCG/ACT NA SUSP: 2.0000 | Freq: Every day | NASAL | Status: DC | PRN

## 2013-12-23 MED ORDER — ALBUTEROL SULFATE HFA 108 (90 BASE) MCG/ACT IN AERS: 2.0000 | INHALATION_SPRAY | Freq: Four times a day (QID) | RESPIRATORY_TRACT | Status: DC | PRN

## 2013-12-23 MED ORDER — SIMVASTATIN 40 MG PO TABS: 40.0000 mg | ORAL_TABLET | Freq: Every day | ORAL | Status: DC

## 2014-01-11 ENCOUNTER — Ambulatory Visit (INDEPENDENT_AMBULATORY_CARE_PROVIDER_SITE_OTHER): Payer: Medicaid Other | Admitting: Family Medicine

## 2014-01-11 ENCOUNTER — Encounter: Payer: Self-pay | Admitting: Family Medicine

## 2014-01-11 ENCOUNTER — Ambulatory Visit
Admission: RE | Admit: 2014-01-11 | Discharge: 2014-01-11 | Disposition: A | Discharge status: Home / Self Care | Payer: Medicaid Other | Source: Ambulatory Visit | Attending: Family Medicine | Admitting: Family Medicine

## 2014-01-11 VITALS — BP 119/76 | HR 79 | Temp 97.5°F | Ht 61.0 in | Wt 179.0 lb

## 2014-01-11 DIAGNOSIS — J441 Chronic obstructive pulmonary disease with (acute) exacerbation: Secondary | ICD-10-CM

## 2014-01-11 DIAGNOSIS — F411 Generalized anxiety disorder: Secondary | ICD-10-CM

## 2014-01-11 DIAGNOSIS — J449 Chronic obstructive pulmonary disease, unspecified: Secondary | ICD-10-CM

## 2014-01-11 DIAGNOSIS — G43909 Migraine, unspecified, not intractable, without status migrainosus: Secondary | ICD-10-CM

## 2014-01-11 DIAGNOSIS — G43019 Migraine without aura, intractable, without status migrainosus: Secondary | ICD-10-CM

## 2014-01-11 MED ORDER — KETOROLAC TROMETHAMINE 60 MG/2ML IM SOLN
60.0000 mg | Freq: Once | INTRAMUSCULAR | Status: AC
  Administered 2014-01-11: 60 mg via INTRAMUSCULAR

## 2014-01-11 MED ORDER — MOMETASONE FURO-FORMOTEROL FUM 100-5 MCG/ACT IN AERO: 2.0000 | INHALATION_SPRAY | Freq: Two times a day (BID) | RESPIRATORY_TRACT | Status: DC

## 2014-01-11 NOTE — Progress Notes (Signed)
Patient ID: [name], female   DOB: 1956-06-15, 58 y.o.   MRN: [id number]  HPI:  Headaches: still continues to have headaches every day. Has seen neurology, who have not adjusted her medicines. Headache everywhere on her head. Has gotten pain shooting through the back behind her R ear, x a few days. Has had earache on L side. No vision changes. Trying not to take ibuprofen or tylenol on a daily basis. No nausea. Able to walk around but states she's kind of staggery from having headaches. Did a headache diary but left it at home. Wants toradol today. It usually lasts her about a week when she gets a shot.  COPD: smoking, cut down to 1/2 ppd. Taking albuterol nebs TID. Uses spiriva daily too. No fevers recently. Feels dry mouth but states she drinks constantly. No chest pain. Gets more SOB in the last week. Cough has been more frequent. Nonproductive.  Anxiety: using zoloft. Denies SI/HI. States this medicine really helps her and she wants to continue with it.  ROS: See HPI  [place]: hx smoking, COPD, chronic daily headaches  PHYSICAL EXAM: BP 119/76  Pulse 79  Temp(Src) 97.5 F (36.4 C) (Oral)  Ht 5\' 1"  (4.098 m)  Wt 179 lb (81.194 kg)  BMI 33.84 kg/m2  SpO2 99% Gen: NAD HEENT: NCAT, PERRL Heart: RRR, no murmurs Lungs: CTAB, NWOB. Mildly diminished air sounds in lower lobe. No wheezing. Neuro: cranial nerves tested and intact. Speech normal. Gait normal.  ASSESSMENT/PLAN:  See problem based charting for additional assessment/plan.  FOLLOW UP: F/u in a few weeks for COPD and headaches.  [place]. Ardelia Mems, [place]

## 2014-01-11 NOTE — Patient Instructions (Signed)
It was great to see you again today!  For headaches - we gave toradol today. Keep a daily headache diary. Follow up with me in a few weeks to address your headaches again.  For COPD - go get a chest xray. I will call you if you need antibiotics. I sent in a prescription for a new inhaler for you to take 2 puffs twice a day, every day. Follow up in a few weeks to see how you are doing with this.  Be well, Dr. Ardelia Mems

## 2014-01-12 ENCOUNTER — Telehealth: Payer: Self-pay | Admitting: Family Medicine

## 2014-01-12 NOTE — Telephone Encounter (Signed)
Called and gave message to patient.[name] [name]

## 2014-01-12 NOTE — Telephone Encounter (Signed)
Pt called and wanted to know if her imaging results have come back yet. jw

## 2014-01-12 NOTE — Telephone Encounter (Signed)
Please call pt and let her know that CXR was normal and she does not need antibiotics right now. She can start the dulera inhaler. If her breathing worsens she should come back in to clinic to be seen.  Leeanne Rio, MD

## 2014-01-15 NOTE — Assessment & Plan Note (Signed)
Continues to complain of chronic daily headaches. No medication adjustments have been done by her neurologist. Will give toradol today for short term relief. I have again asked [name] to fill out a headache diary and bring it in with her for [place] to review together. She will f/u in a few weeks to review her headache diary.

## 2014-01-15 NOTE — Assessment & Plan Note (Signed)
Well controlled. Will continue current regimen.

## 2014-01-15 NOTE — Assessment & Plan Note (Signed)
This does not seem like a true COPD exacerbation as she is not hypoxic, and has a relatively nonfocal lung exam (except for mildly diminished air movement in one lung base). She is reporting more frequent coughing. The frequency with which she's using albuterol nebs is way more than ideal (using TID). Need to add on additional inhaler for better baseline control. Will add dulera. Will also check CXR to ensure no focal infiltrate to suggest need for antibiotics. Pt has large antibiotic allergy list, and is frequently put on Levaquin so I would like to avoid doing antibiotics if possible. Will allow CXR to guide. F/u in a few weeks to monitor for improvement with dulera. Precepted with Dr. Lindell Noe who agrees with this plan.

## 2014-01-17 ENCOUNTER — Other Ambulatory Visit: Payer: Self-pay | Admitting: *Deleted

## 2014-01-19 MED ORDER — ALBUTEROL SULFATE (2.5 MG/3ML) 0.083% IN NEBU: 2.5000 mg | INHALATION_SOLUTION | Freq: Four times a day (QID) | RESPIRATORY_TRACT | Status: DC | PRN

## 2014-01-31 ENCOUNTER — Other Ambulatory Visit: Payer: Self-pay | Admitting: *Deleted

## 2014-01-31 MED ORDER — OMEPRAZOLE 40 MG PO CPDR: 40.0000 mg | DELAYED_RELEASE_CAPSULE | Freq: Two times a day (BID) | ORAL | Status: DC

## 2014-02-02 ENCOUNTER — Ambulatory Visit: Payer: Medicaid Other | Admitting: Family Medicine

## 2014-02-15 ENCOUNTER — Ambulatory Visit: Payer: Medicaid Other | Admitting: Family Medicine

## 2014-02-21 ENCOUNTER — Other Ambulatory Visit: Payer: Self-pay | Admitting: *Deleted

## 2014-02-21 DIAGNOSIS — F410 Panic disorder [episodic paroxysmal anxiety] without agoraphobia: Secondary | ICD-10-CM

## 2014-02-24 ENCOUNTER — Ambulatory Visit: Payer: Medicaid Other | Admitting: Family Medicine

## 2014-02-24 MED ORDER — MOMETASONE FURO-FORMOTEROL FUM 100-5 MCG/ACT IN AERO: 2.0000 | INHALATION_SPRAY | Freq: Two times a day (BID) | RESPIRATORY_TRACT | Status: DC

## 2014-02-24 MED ORDER — SERTRALINE HCL 50 MG PO TABS: 50.0000 mg | ORAL_TABLET | Freq: Every day | ORAL | Status: DC

## 2014-02-24 MED ORDER — FLUTICASONE PROPIONATE 50 MCG/ACT NA SUSP: 2.0000 | Freq: Every day | NASAL | Status: DC | PRN

## 2014-03-02 ENCOUNTER — Ambulatory Visit: Payer: Medicaid Other | Admitting: Family Medicine

## 2014-03-21 ENCOUNTER — Ambulatory Visit
Admission: RE | Admit: 2014-03-21 | Discharge: 2014-03-21 | Disposition: A | Discharge status: Home / Self Care | Payer: Medicaid Other | Source: Ambulatory Visit | Attending: Family Medicine | Admitting: Family Medicine

## 2014-03-21 ENCOUNTER — Ambulatory Visit (INDEPENDENT_AMBULATORY_CARE_PROVIDER_SITE_OTHER): Payer: Medicaid Other | Admitting: Family Medicine

## 2014-03-21 ENCOUNTER — Encounter: Payer: Self-pay | Admitting: Family Medicine

## 2014-03-21 VITALS — BP 133/81 | HR 94 | Temp 98.0°F | Ht 61.0 in | Wt 181.7 lb

## 2014-03-21 DIAGNOSIS — R05 Cough: Secondary | ICD-10-CM

## 2014-03-21 DIAGNOSIS — R059 Cough, unspecified: Secondary | ICD-10-CM | POA: Diagnosis present

## 2014-03-21 MED ORDER — GUAIFENESIN ER 600 MG PO TB12: 600.0000 mg | ORAL_TABLET | Freq: Two times a day (BID) | ORAL | Status: DC

## 2014-03-21 MED ORDER — IPRATROPIUM BROMIDE 0.02 % IN SOLN
0.5000 mg | Freq: Once | RESPIRATORY_TRACT | Status: AC
  Administered 2014-03-21: 0.5 mg via RESPIRATORY_TRACT

## 2014-03-21 MED ORDER — PREDNISONE 50 MG PO TABS: 50.0000 mg | ORAL_TABLET | Freq: Every day | ORAL | Status: DC

## 2014-03-21 MED ORDER — ALBUTEROL SULFATE (2.5 MG/3ML) 0.083% IN NEBU
2.5000 mg | INHALATION_SOLUTION | Freq: Once | RESPIRATORY_TRACT | Status: AC
  Administered 2014-03-21: 2.5 mg via RESPIRATORY_TRACT

## 2014-03-21 NOTE — Patient Instructions (Signed)
Chronic Obstructive Pulmonary Disease Exacerbation  Chronic obstructive pulmonary disease (COPD) is a common lung problem. In COPD, the flow of air from the lungs is limited. COPD exacerbations are times that breathing gets worse and you need extra treatment. Without treatment they can be life threatening. If they happen often, your lungs can become more damaged. HOME CARE  Do not smoke.  Avoid tobacco smoke and other things that bother your lungs.  If given, take your antibiotic medicine as told. Finish the medicine even if you start to feel better.  Only take medicines as told by your doctor.  Drink enough fluids to keep your pee (urine) clear or pale yellow (unless your doctor has told you not to).  Use a cool mist machine (vaporizer).  If you use oxygen or a machine that turns liquid medicine into a mist (nebulizer), continue to use them as told.  Keep up with shots (vaccinations) as told by your doctor.  Exercise regularly.  Eat healthy foods.  Keep all doctor visits as told. GET HELP RIGHT AWAY IF:  You are very short of breath and it gets worse.  You have trouble talking.  You have bad chest pain.  You have blood in your spit (sputum).  You have a fever.  You keep throwing up (vomiting).  You feel weak, or you pass out (faint).  You feel confused.  You keep getting worse. MAKE SURE YOU:   Understand these instructions.  Will watch your condition.  Will get help right away if you are not doing well or get worse. Document Released: 07/17/2011 Document Revised: 05/18/2013 Document Reviewed: 04/01/2013 [facility] Patient Information 2015 [place], [place]. This information is not intended to replace advice given to you by your health care provider. Make sure you discuss any questions you have with your health care provider.  Smoking Cessation Quitting smoking is important to your health and has many advantages. However, it is not always easy to quit since  nicotine is a very addictive drug. Oftentimes, people try 3 times or more before being able to quit. This document explains the best ways for you to prepare to quit smoking. Quitting takes hard work and a lot of effort, but you can do it. ADVANTAGES OF QUITTING SMOKING  You will live longer, feel better, and live better.  Your body will feel the impact of quitting smoking almost immediately.  Within 20 minutes, blood pressure decreases. Your pulse returns to its normal level.  After 8 hours, carbon monoxide levels in the blood return to normal. Your oxygen level increases.  After 24 hours, the chance of having a heart attack starts to decrease. Your breath, hair, and body stop smelling like smoke.  After 48 hours, damaged nerve endings begin to recover. Your sense of taste and smell improve.  After 72 hours, the body is virtually free of nicotine. Your bronchial tubes relax and breathing becomes easier.  After 2 to 12 weeks, lungs can hold more air. Exercise becomes easier and circulation improves.  The risk of having a heart attack, stroke, cancer, or lung disease is greatly reduced.  After 1 year, the risk of coronary heart disease is cut in half.  After 5 years, the risk of stroke falls to the same as a nonsmoker.  After 10 years, the risk of lung cancer is cut in half and the risk of other cancers decreases significantly.  After 15 years, the risk of coronary heart disease drops, usually to the level of a nonsmoker.  If you are pregnant, quitting smoking will improve your chances of having a healthy baby.  The people you live with, especially any children, will be healthier.  You will have extra money to spend on things other than cigarettes. QUESTIONS TO THINK ABOUT BEFORE ATTEMPTING TO QUIT You may want to talk about your answers with your health care provider.  Why do you want to quit?  If you tried to quit in the past, what helped and what did not?  What will be the  most difficult situations for you after you quit? How will you plan to handle them?  Who can help you through the tough times? Your family? Friends? A health care provider?  What pleasures do you get from smoking? What ways can you still get pleasure if you quit? Here are some questions to ask your health care provider:  How can you help me to be successful at quitting?  What medicine do you think would be best for me and how should I take it?  What should I do if I need more help?  What is smoking withdrawal like? How can I get information on withdrawal? GET READY  Set a quit date.  Change your environment by getting rid of all cigarettes, ashtrays, matches, and lighters in your home, car, or work. Do not let people smoke in your home.  Review your past attempts to quit. Think about what worked and what did not. GET SUPPORT AND ENCOURAGEMENT You have a better chance of being successful if you have help. You can get support in many ways.  Tell your family, friends, and coworkers that you are going to quit and need their support. Ask them not to smoke around you.  Get individual, group, or telephone counseling and support. Programs are available at [employer] and health centers. Call your local health department for information about programs in your area.  Spiritual beliefs and practices may help some smokers quit.  Download a "quit meter" on your computer to keep track of quit statistics, such as how long you have gone without smoking, cigarettes not smoked, and money saved.  Get a self-help book about quitting smoking and staying off tobacco. [place] yourself from urges to smoke. Talk to someone, go for a walk, or occupy your time with a task.  Change your normal routine. Take a different route to work. Drink tea instead of coffee. Eat breakfast in a different place.  Reduce your stress. Take a hot bath, exercise, or read a book.  Plan  something enjoyable to do every day. Reward yourself for not smoking.  Explore interactive web-based programs that specialize in helping you quit. GET MEDICINE AND USE IT CORRECTLY Medicines can help you stop smoking and decrease the urge to smoke. Combining medicine with the above behavioral methods and support can greatly increase your chances of successfully quitting smoking.  Nicotine replacement therapy helps deliver nicotine to your body without the negative effects and risks of smoking. Nicotine replacement therapy includes nicotine gum, lozenges, inhalers, nasal sprays, and skin patches. Some may be available over-the-counter and others require a prescription.  Antidepressant medicine helps people abstain from smoking, but how this works is unknown. This medicine is available by prescription.  Nicotinic receptor partial agonist medicine simulates the effect of nicotine in your brain. This medicine is available by prescription. Ask your health care provider for advice about which medicines to use and how to use them based on your  health history. Your health care provider will tell you what side effects to look out for if you choose to be on a medicine or therapy. Carefully read the information on the package. Do not use any other product containing nicotine while using a nicotine replacement product.  RELAPSE OR DIFFICULT SITUATIONS Most relapses occur within the first 3 months after quitting. Do not be discouraged if you start smoking again. Remember, most people try several times before finally quitting. You may have symptoms of withdrawal because your body is used to nicotine. You may crave cigarettes, be irritable, feel very hungry, cough often, get headaches, or have difficulty concentrating. The withdrawal symptoms are only temporary. They are strongest when you first quit, but they will go away within 10-14 days. To reduce the chances of relapse, try to:  Avoid drinking alcohol.  Drinking lowers your chances of successfully quitting.  Reduce the amount of caffeine you consume. Once you quit smoking, the amount of caffeine in your body increases and can give you symptoms, such as a rapid heartbeat, sweating, and anxiety.  Avoid smokers because they can make you want to smoke.  Do not let weight gain distract you. Many smokers will gain weight when they quit, usually less than 10 pounds. Eat a healthy diet and stay active. You can always lose the weight gained after you quit.  Find ways to improve your mood other than smoking. FOR MORE INFORMATION  www.smokefree.gov  Document Released: 07/22/2001 Document Revised: 12/12/2013 Document Reviewed: 11/06/2011 [facility] Patient Information 2015 [place], [place]. This information is not intended to replace advice given to you by your health care provider. Make sure you discuss any questions you have with your health care provider.  I have called her in a steroid package. Please complete these as directed. Please go have chest x-ray this evening. I will call you tomorrow morning if you have pneumonia and call you in an antibiotic. You can continue to take an over-the-counter cough suppressant and Mucinex if needed.

## 2014-03-21 NOTE — Assessment & Plan Note (Addendum)
Patient with productive cough, with chills and fatigue patient is a one pack per day smoker, with wheezing on exam today. Will obtain chest x-ray to rule out pneumonia. Possible etiologies pneumonia versus bronchitis versus COPD exacerbation, does not appear to be upper respiratory infection. Will obtain chest x-ray, call patient with results and start Levaquin if necessary. Will prescribe steroid burst. She received a breathing treatment during office appointment with mild improvement. Red flags discussed with patient. Smoking cessation advised and given and AVS. Followup in one to 2 weeks, sooner if needed.

## 2014-03-21 NOTE — Progress Notes (Signed)
   Subjective:    Patient ID: [name], female    DOB: 03-18-1956, 58 y.o.   MRN: [id number]  HPI [name] is a 58 y.o. presents to [facility]  Cough: Patient states one week ago she noticed increasing cough and phlegm production. Patient is a one pack per day smoker with multiple events of bronchitis yearly. Patient states she's had to use her albuterol inhaler and nebulizers daily x4. She endorses chills, nasal congestion, fatigue and headaches. She states she "keeps "nasal congestion and headaches. She endorses compliance with her to  [facility] BID. She denies fevers, nausea, vomiting, chest pain, shortness of breath or diarrhea.  Review of Systems Per HPI    Objective:   Physical Exam BP 133/81  Pulse 94  Temp(Src) 98 F (36.7 C) (Oral)  Ht 5\' 1"  (1.549 m)  Wt 181 lb 11.2 oz (82.419 kg)  BMI 34.35 kg/m2 Gen: Pleasant Caucasian female, no acute distress, nontoxic in appearance. Well-developed, well-nourished HEENT: AT. [place].. Bilateral eyes without injections or icterus. MMM. Bilateral nares pale, no swelling. Throat without erythema or exudates.  CV: RRR no murmur Chest: Diminished air movement bilaterally, diffuse wheezing upper and lower lobes. Rhonchi diffusely. No crackles appreciated      Assessment & Plan:

## 2014-03-22 ENCOUNTER — Telehealth: Payer: Self-pay | Admitting: Family Medicine

## 2014-03-22 NOTE — Telephone Encounter (Signed)
Please call the patient and inform her that she does not have pneumonia. She should follow up with her PCP in 2 weeks. She needs to make an appointment to evaluate and discuss her chronic headaches as well. Thanks.

## 2014-03-22 NOTE — Telephone Encounter (Signed)
Spoke with patient and informed her of below, she has set up appointment already with PCP to follow up

## 2014-04-10 ENCOUNTER — Ambulatory Visit: Payer: Medicaid Other | Admitting: Family Medicine

## 2014-04-19 ENCOUNTER — Ambulatory Visit (INDEPENDENT_AMBULATORY_CARE_PROVIDER_SITE_OTHER): Payer: Medicaid Other | Admitting: Family Medicine

## 2014-04-19 ENCOUNTER — Encounter: Payer: Self-pay | Admitting: Family Medicine

## 2014-04-19 VITALS — BP 129/77 | HR 87 | Temp 98.1°F | Ht 61.0 in | Wt 185.9 lb

## 2014-04-19 DIAGNOSIS — R296 Repeated falls: Secondary | ICD-10-CM

## 2014-04-19 DIAGNOSIS — Z23 Encounter for immunization: Secondary | ICD-10-CM | POA: Diagnosis not present

## 2014-04-19 DIAGNOSIS — J42 Unspecified chronic bronchitis: Secondary | ICD-10-CM

## 2014-04-19 DIAGNOSIS — R51 Headache: Secondary | ICD-10-CM

## 2014-04-19 DIAGNOSIS — G43019 Migraine without aura, intractable, without status migrainosus: Secondary | ICD-10-CM | POA: Diagnosis not present

## 2014-04-19 DIAGNOSIS — Z9181 History of falling: Secondary | ICD-10-CM | POA: Diagnosis not present

## 2014-04-19 MED ORDER — KETOROLAC TROMETHAMINE 30 MG/ML IJ SOLN
30.0000 mg | Freq: Once | INTRAMUSCULAR | Status: AC
  Administered 2014-04-19: 30 mg via INTRAMUSCULAR

## 2014-04-19 NOTE — Patient Instructions (Signed)
It was great to see you again today!  Headaches: bring headache diary with you. I will try to call your neurologist's office Gave toradol shot today  Breathing: Take dulera two puffs TWICE a day Gave pneumonia shot today  Follow up in a month to see how you're doing.  Be well, Dr. Ardelia Mems

## 2014-04-19 NOTE — Progress Notes (Signed)
Patient ID: [name], female   DOB: 01/19/1956, 58 y.o.   MRN: [id number]  HPI:  Headache: left headache diary at home. Has been hurting for about one month. Has not noticed any triggers. States she can't walk when she first gets up in the morning, has to sit and wait before getting up. Uses walker whenever she's at home by herself. Also states she sometimes gets tongue tied and can't talk normally. Headaches she currently has are the same as before. No falls.  Breathing - using albuterol nebulizer TID for about one month due to shortness of breath. Using dulera only once a day instead of 2 puffs BID as ordered. She also uses spiriva every day.  ROS: See HPI.  [place]: smokes 1 ppd  PHYSICAL EXAM: BP 129/77  Pulse 87  Temp(Src) 98.1 F (36.7 C) (Oral)  Ht 5\' 1"  (1.549 m)  Wt 185 lb 14.4 oz (84.324 kg)  BMI 35.14 kg/m2 Gen: NAD HEENT: NCAT, PERRL Heart: RRR no murmurs Lungs: CTAB, NWOB, very rare scattered wheeze, fairly good aeration Neuro: CN II-XII tested and intact, negative homans, normal gait but does tend to grab onto things for support, normal FNF and heel shin testing Ext: No appreciable lower extremity edema bilaterally   ASSESSMENT/PLAN:  Health maintenance:  -given pneumovax today  MIGRAINE, COMMON, INTRACTABLE Neuro exam nonfocal. Will give toradol 30mg  IM today. Had frank discussion with pt again today about the importance of headache diary and about regular f/u with neurology. She has been seen there without any changes in her medications, and yet continues to come into our clinic complaining of severe headaches and requires injections of IM toradol. I would like to get her better control prophylactically if possible. Will attempt to call and speak with her neurologist to see what other options are available.  Frequent falls No recent falls. Gait relatively steady without walker today but pt does try to grab onto items for support. Recommended she continue to use the  walker for fall prevention.  Chronic obstructive pulmonary disease (COPD) Poorly controlled as evidenced by using albuterol nebs TID. Recommended she take dulera 2 puffs BID instead of daily as she has been doing. Continue spiriva. F/u in a month to evaluate for improvement.   FOLLOW UP: F/u in 1 month for headaches and COPD.  [address] Ardelia Mems, [place]

## 2014-04-20 ENCOUNTER — Other Ambulatory Visit: Payer: Self-pay | Admitting: *Deleted

## 2014-04-20 DIAGNOSIS — J441 Chronic obstructive pulmonary disease with (acute) exacerbation: Secondary | ICD-10-CM

## 2014-04-20 DIAGNOSIS — F410 Panic disorder [episodic paroxysmal anxiety] without agoraphobia: Secondary | ICD-10-CM

## 2014-04-21 MED ORDER — MOMETASONE FURO-FORMOTEROL FUM 100-5 MCG/ACT IN AERO: 2.0000 | INHALATION_SPRAY | Freq: Two times a day (BID) | RESPIRATORY_TRACT | Status: DC

## 2014-04-21 MED ORDER — TIOTROPIUM BROMIDE MONOHYDRATE 18 MCG IN CAPS: 18.0000 ug | ORAL_CAPSULE | Freq: Every day | RESPIRATORY_TRACT | Status: DC

## 2014-04-21 MED ORDER — ALBUTEROL SULFATE HFA 108 (90 BASE) MCG/ACT IN AERS: 2.0000 | INHALATION_SPRAY | Freq: Four times a day (QID) | RESPIRATORY_TRACT | Status: DC | PRN

## 2014-04-21 MED ORDER — SERTRALINE HCL 50 MG PO TABS: 50.0000 mg | ORAL_TABLET | Freq: Every day | ORAL | Status: DC

## 2014-04-21 MED ORDER — FLUTICASONE PROPIONATE 50 MCG/ACT NA SUSP
2.0000 | Freq: Every day | NASAL | Status: DC | PRN
Start: 2014-04-21 — End: 2014-06-19

## 2014-04-21 MED ORDER — SIMVASTATIN 40 MG PO TABS: 40.0000 mg | ORAL_TABLET | Freq: Every day | ORAL | Status: DC

## 2014-04-23 NOTE — Assessment & Plan Note (Signed)
No recent falls. Gait relatively steady without walker today but pt does try to grab onto items for support. Recommended she continue to use the walker for fall prevention.

## 2014-04-23 NOTE — Assessment & Plan Note (Signed)
Neuro exam nonfocal. Will give toradol 30mg  IM today. Had frank discussion with pt again today about the importance of headache diary and about regular f/u with neurology. She has been seen there without any changes in her medications, and yet continues to come into our clinic complaining of severe headaches and requires injections of IM toradol. I would like to get her better control prophylactically if possible. Will attempt to call and speak with her neurologist to see what other options are available.

## 2014-04-23 NOTE — Assessment & Plan Note (Signed)
Poorly controlled as evidenced by using albuterol nebs TID. Recommended she take dulera 2 puffs BID instead of daily as she has been doing. Continue spiriva. F/u in a month to evaluate for improvement.

## 2014-04-24 ENCOUNTER — Encounter: Payer: Self-pay | Admitting: Internal Medicine

## 2014-04-26 ENCOUNTER — Ambulatory Visit: Payer: Medicaid Other | Admitting: Family Medicine

## 2014-05-19 ENCOUNTER — Other Ambulatory Visit: Payer: Self-pay | Admitting: *Deleted

## 2014-05-22 ENCOUNTER — Ambulatory Visit: Payer: Medicaid Other | Admitting: Family Medicine

## 2014-05-23 MED ORDER — LORATADINE 10 MG PO CAPS: 10.0000 mg | ORAL_CAPSULE | Freq: Every day | ORAL | Status: DC

## 2014-05-26 ENCOUNTER — Ambulatory Visit: Payer: Medicaid Other | Admitting: Nurse Practitioner

## 2014-06-19 ENCOUNTER — Ambulatory Visit (INDEPENDENT_AMBULATORY_CARE_PROVIDER_SITE_OTHER): Payer: Medicaid Other | Admitting: Family Medicine

## 2014-06-19 ENCOUNTER — Other Ambulatory Visit: Payer: Self-pay | Admitting: *Deleted

## 2014-06-19 ENCOUNTER — Encounter: Payer: Self-pay | Admitting: Family Medicine

## 2014-06-19 VITALS — BP 123/82 | HR 79 | Temp 97.8°F | Ht 61.0 in | Wt 189.1 lb

## 2014-06-19 DIAGNOSIS — J441 Chronic obstructive pulmonary disease with (acute) exacerbation: Secondary | ICD-10-CM

## 2014-06-19 DIAGNOSIS — R519 Headache, unspecified: Secondary | ICD-10-CM

## 2014-06-19 DIAGNOSIS — E559 Vitamin D deficiency, unspecified: Secondary | ICD-10-CM

## 2014-06-19 DIAGNOSIS — F410 Panic disorder [episodic paroxysmal anxiety] without agoraphobia: Secondary | ICD-10-CM

## 2014-06-19 DIAGNOSIS — E785 Hyperlipidemia, unspecified: Secondary | ICD-10-CM

## 2014-06-19 DIAGNOSIS — R51 Headache: Secondary | ICD-10-CM

## 2014-06-19 DIAGNOSIS — N951 Menopausal and female climacteric states: Secondary | ICD-10-CM

## 2014-06-19 DIAGNOSIS — R232 Flushing: Secondary | ICD-10-CM

## 2014-06-19 LAB — POCT GLYCOSYLATED HEMOGLOBIN (HGB A1C): Hemoglobin A1C: 5.7

## 2014-06-19 MED ORDER — CHOLECALCIFEROL 50 MCG (2000 UT) PO CAPS: ORAL_CAPSULE | ORAL | Status: DC

## 2014-06-19 MED ORDER — PREDNISONE 50 MG PO TABS: 50.0000 mg | ORAL_TABLET | Freq: Every day | ORAL | Status: DC

## 2014-06-19 MED ORDER — KETOROLAC TROMETHAMINE 30 MG/ML IJ SOLN
30.0000 mg | Freq: Once | INTRAMUSCULAR | Status: AC
  Administered 2014-06-19: 30 mg via INTRAMUSCULAR

## 2014-06-19 MED ORDER — MOMETASONE FURO-FORMOTEROL FUM 200-5 MCG/ACT IN AERO: 2.0000 | INHALATION_SPRAY | Freq: Two times a day (BID) | RESPIRATORY_TRACT | Status: DC

## 2014-06-19 NOTE — Patient Instructions (Signed)
COPD: -increased dose of dulera inhaler (still do 2 puffs twice a day) -do prednisone 50mg  daily for 5 days -checking chest xray  Headache: -follow up tomorrow with neurologist. Tell them this is NOT well controlled. I will try to call in the morning as well.  Refilled vitamin D I will call you if your test results are not normal.  Otherwise, I will send you a letter.  If you do not hear from me with in 2 weeks please call our office.      Follow up with me in 1 month for COPD or sooner if you have any problems.  Be well, Dr. Ardelia Mems

## 2014-06-19 NOTE — Progress Notes (Signed)
Patient ID: PHYLLISS STREGE, female   DOB: Jun 05, 1956, 58 y.o.   MRN: [id number]  HPI:  Bronchitis - coughing a lot for the last 2-3 weeks. Thinks has flare of COPD/bronchitis. No fevers. Using albuterol about three times a day. Also does spiriva daily and dulera 100-5 2 puff BID.   Headache - still having every day. Worse today than other days. Left headache diary at home. Tried tylenol, didn't help. Feels hot a lot. Has neuro appt to follow up tomorrow morning. Requesting toradol shot today.  ROS: See HPI.  [place]: hx obesity, tobacco abuse, COPD, HLD, chronic migraines  PHYSICAL EXAM: BP 123/82 mmHg  Pulse 79  Temp(Src) 97.8 F (36.6 C) (Oral)  Ht 5\' 1"  (1.549 m)  Wt 189 lb 1.6 oz (85.775 kg)  BMI 35.75 kg/m2 Gen: NAD HEENT: NCAT Heart: RRR no murmurs Lungs: faint wheezes throughout but relatively good aeration. Possible decr air movement on R compared to L. No respiratory distress, normal effort Neuro: grossly nonfocal, speech normal, face symmetric, gait normal Ext: No appreciable lower extremity edema bilaterally   ASSESSMENT/PLAN:  COPD exacerbation Seems to have another exacerbation. No hypoxia on resting pulse ox today.  - obtain cxr  - treat presumptively with 5 days of prednisone - avoid abx unless has proven infiltrate on xray, as she has limited abx options due to allergies/intolerances and she has frequent exacerbations. - increase strength of dulera to 200-5 2 puffs BID  Headache Will give toradol today per patient request. Pt has f/u appt tomorrow 11/10 and will hopefully have some change to her chronic medication management as she is frequently seen in our clinic with headaches. Still has yet to bring headache log into one of our visits.  Vitamin D deficiency Check vitamin D level today & replete as needed based on that result.  Hyperlipidemia Check lipids & CMET today   FOLLOW UP: F/u in 1 month for COPD  [place] [address]. Ardelia Mems, [place]

## 2014-06-20 ENCOUNTER — Encounter: Payer: Self-pay | Admitting: Nurse Practitioner

## 2014-06-20 ENCOUNTER — Ambulatory Visit (INDEPENDENT_AMBULATORY_CARE_PROVIDER_SITE_OTHER): Payer: Medicaid Other | Admitting: Nurse Practitioner

## 2014-06-20 VITALS — BP 114/68 | HR 84 | Ht 61.0 in | Wt 189.0 lb

## 2014-06-20 DIAGNOSIS — R51 Headache: Secondary | ICD-10-CM

## 2014-06-20 DIAGNOSIS — Z5181 Encounter for therapeutic drug level monitoring: Secondary | ICD-10-CM

## 2014-06-20 DIAGNOSIS — G43019 Migraine without aura, intractable, without status migrainosus: Secondary | ICD-10-CM

## 2014-06-20 DIAGNOSIS — R5601 Complex febrile convulsions: Secondary | ICD-10-CM

## 2014-06-20 DIAGNOSIS — G4733 Obstructive sleep apnea (adult) (pediatric): Secondary | ICD-10-CM

## 2014-06-20 DIAGNOSIS — G8929 Other chronic pain: Secondary | ICD-10-CM

## 2014-06-20 LAB — CBC
HCT: 33.5 % — ABNORMAL LOW (ref 36.0–46.0)
Hemoglobin: 11.6 g/dL — ABNORMAL LOW (ref 12.0–15.0)
MCH: 30.9 pg (ref 26.0–34.0)
MCHC: 34.6 g/dL (ref 30.0–36.0)
MCV: 89.3 fL (ref 78.0–100.0)
Platelets: 220 10*3/uL (ref 150–400)
RBC: 3.75 MIL/uL — ABNORMAL LOW (ref 3.87–5.11)
RDW: 14.4 % (ref 11.5–15.5)
WBC: 7.8 10*3/uL (ref 4.0–10.5)

## 2014-06-20 LAB — LIPID PANEL
Cholesterol: 148 mg/dL (ref 0–200)
HDL: 52 mg/dL (ref >39)
LDL CALC: 61 mg/dL (ref 0–99)
TRIGLYCERIDES: 176 mg/dL — AB (ref <150)
Total CHOL/HDL Ratio: 2.8 Ratio
VLDL: 35 mg/dL (ref 0–40)

## 2014-06-20 LAB — COMPREHENSIVE METABOLIC PANEL
ALBUMIN: 3.9 g/dL (ref 3.5–5.2)
ALK PHOS: 111 U/L (ref 39–117)
ALT: <8 U/L (ref 0–35)
AST: 10 U/L (ref 0–37)
BILIRUBIN TOTAL: 0.2 mg/dL (ref 0.2–1.2)
BUN: 17 mg/dL (ref 6–23)
CO2: 26 mEq/L (ref 19–32)
Calcium: 9.2 mg/dL (ref 8.4–10.5)
Chloride: 107 mEq/L (ref 96–112)
Creat: 0.89 mg/dL (ref 0.50–1.10)
Glucose, Bld: 79 mg/dL (ref 70–99)
POTASSIUM: 4.4 meq/L (ref 3.5–5.3)
Sodium: 137 mEq/L (ref 135–145)
Total Protein: 6.6 g/dL (ref 6.0–8.3)

## 2014-06-20 LAB — TSH: TSH: 2.314 u[IU]/mL (ref 0.350–4.500)

## 2014-06-20 LAB — VITAMIN D 25 HYDROXY (VIT D DEFICIENCY, FRACTURES): Vit D, 25-Hydroxy: 17 ng/mL — ABNORMAL LOW (ref 30–89)

## 2014-06-20 NOTE — Progress Notes (Signed)
[facility]  PATIENT: [name] DOB: [birth year]   REASON FOR VISIT: for seizure disorder and migraine   HISTORY OF PRESENT ILLNESS:[name], 58 year old female returns for followup. She has a long history of headaches. She also has sleep apnea and uses CPAP. Her  headaches are generalized in nature along some chronic stiff neck pain she continues to smoke but is trying to quit after having an admission to the hospital for respiratory failure. She has a remote history of seizures with no active seizure activity in many years.Most recent CT of the head 11/20/13 with no acute intracranial pathology. Right maxillary sinus disease.She is currently on prednisone Dosepak for exacerbation of her COPD. She claims her headaches are daily. She is currently on Topamax 300 mg daily.She returns for reevaluation  HISTORY: She has a long history of chronic daily headaches. She also has sleep apnea and uses CPAP which is helped somewhat with her early morning headaches. Her headaches are generalized in nature along with some chronic neck discomfort and neck stiffness. Gabapentin has not been helpful in the past. She is currently on Topamax and Seroquel. She continues to smoke a pack a day and was encouraged to quit. She reports that she takes 6-8 Tylenol a day and was made aware that it can cause rebound headaches.  He has had some dizziness and the doctor as I would not prescribe this medication for the study thank you ches. She states that her headaches have really not changed. She takes the preventative medication as prescribed, but she doesn't think they have made much difference. She does sleep better with Seroquel though. She takes ibuprofen for her headaches nearly every day. When asked about readiness to stop smoking, she states "I have too much stress already to try to stop smoking." She is not able to take triptans.     REVIEW OF SYSTEMS: Full 14 system review of systems performed and  notable only for those listed, all others are neg:  Constitutional: N/A  Cardiovascular: N/A  Ear/Nose/Throat: N/A  Skin: N/A  Eyes: N/A  Respiratory: cough, shortness of breath, wheezing Gastroitestinal: N/A  Hematology/Lymphatic: N/A  Endocrine: intolerance to heat and cold. Musculoskeletal:back pain, aching muscles neck pain Allergy/Immunology: N/A  Neurological: dizziness, headache Psychiatric: N/A Sleep : obstructive sleep apnea with CPAP   ALLERGIES: Allergies  Allergen Reactions  . Amoxicillin Anaphylaxis    Breakout, mouth swells  . Azithromycin Swelling and Rash    "swelling all over, high, thrush"  . Doxycycline Hyclate Anaphylaxis and Rash    swelling  . Penicillins Shortness Of Breath and Rash    "almost died, ended up in hospital"  . Sulfonamide Derivatives Shortness Of Breath, Swelling and Rash    "break out from head to toe"  . Chantix [Varenicline] Other (See Comments)    "bad dreams, difficulty breathing"  . Clarithromycin Hives    swelling  . Nortriptyline Other (See Comments)    Per patient had "kidney problems" on this medication, could not use bathroom (urinary retention), swelling and breakout  . Fluticasone-Salmeterol Other (See Comments)    REACTION: ulcers in mouth    HOME MEDICATIONS: Outpatient Prescriptions Prior to Visit  Medication Sig Dispense Refill  . acetaminophen (TYLENOL) 325 MG tablet Take 2 tablets (650 mg total) by mouth every 6 (six) hours as needed for pain or fever (greater than 101). For pain    . albuterol (PROAIR HFA) 108 (90 BASE) MCG/ACT inhaler Inhale 2 puffs into the lungs every  6 (six) hours as needed. Shortness of breath 1 Inhaler 3  . albuterol (PROVENTIL) (2.5 MG/3ML) 0.083% nebulizer solution Take 3 mLs (2.5 mg total) by nebulization every 6 (six) hours as needed. For shortness of breath 75 mL 3  . aspirin EC 81 MG tablet Take 1 tablet (81 mg total) by mouth daily.    . Cholecalciferol (CVS VITAMIN D) 2000 UNITS CAPS  TAKE ONE CAPSULE BY MOUTH EVERY DAY 30 capsule 11  . ferrous sulfate 325 (65 FE) MG tablet Take 1 tablet (325 mg total) by mouth 2 (two) times daily with a meal. 60 tablet 3  . fluticasone (FLONASE) 50 MCG/ACT nasal spray Place 2 sprays into both nostrils daily as needed. For congestion. 16 g 1  . guaiFENesin (MUCINEX) 600 MG 12 hr tablet Take 1 tablet (600 mg total) by mouth 2 (two) times daily. 60 tablet 0  . lamoTRIgine (LAMICTAL) 150 MG tablet Take 1 tablet (150 mg total) by mouth 2 (two) times daily. 180 tablet 3  . Loratadine 10 MG CAPS Take 1 capsule (10 mg total) by mouth daily. 30 each 5  . mometasone-formoterol (DULERA) 200-5 MCG/ACT AERO Inhale 2 puffs into the lungs 2 (two) times daily. 13 g 3  . NITROFURANTOIN PO Take by mouth.    Marland Kitchen omeprazole (PRILOSEC) 40 MG capsule Take 1 capsule (40 mg total) by mouth 2 (two) times daily. 60 capsule 5  . sertraline (ZOLOFT) 50 MG tablet Take 1 tablet (50 mg total) by mouth daily. 30 tablet 1  . simvastatin (ZOCOR) 40 MG tablet Take 1 tablet (40 mg total) by mouth at bedtime. 30 tablet 3  . tiotropium (SPIRIVA) 18 MCG inhalation capsule Place 1 capsule (18 mcg total) into inhaler and inhale daily. 30 capsule 11  . topiramate (TOPAMAX) 100 MG tablet 1tab in the am, 2 at bedtime 270 tablet 3  . predniSONE (DELTASONE) 50 MG tablet Take 1 tablet (50 mg total) by mouth daily with breakfast. 5 tablet 0  . Blood Glucose Monitoring Suppl (BLOOD GLUCOSE METER) kit Use to check blood glucose daily.  Accuchek preffered by patient 1 each 0  . Glucose Blood (BLOOD GLUCOSE TEST STRIPS) STRP Use to test blood glucose daily. 100 each 12  . nicotine polacrilex (CVS NICOTINE POLACRILEX) 4 MG gum Take 1 each (4 mg total) by mouth as needed for smoking cessation (up to 12 pieces daily). 240 tablet 5   No facility-administered medications prior to visit.    PAST MEDICAL HISTORY: Past Medical History  Diagnosis Date  . Allergic rhinitis   . Hypertension   .  Hyperlipidemia   . GERD (gastroesophageal reflux disease)   . Glaucoma   . Neck pain     Cervical disc  . IBS (irritable bowel syndrome)   . Esophageal stricture   . Diverticulosis   . Right leg DVT 1982    groin  . COPD (chronic obstructive pulmonary disease)   . Emphysema   . Pneumonia 2012; 04/27/2012  . Chronic bronchitis     "keep it all the time" (04/27/2012)  . Exertional dyspnea   . Type II diabetes mellitus     No meds since weight loss  . Migraine   . Epilepsy   . DJD (degenerative joint disease)   . Arthritis     "hands and legs and feet" (04/27/2012)  . Depression   . OSA on CPAP     6-7 yrs  . UTI (urinary tract infection)  2 weeks ago  . Cancer 05    rt arm mole rem cancer    PAST SURGICAL HISTORY: Past Surgical History  Procedure Laterality Date  . Tubal ligation  2001  . Knee arthroscopy  1990's    left  . Carpal tunnel release  ~ 2008    right  . Shoulder open rotator cuff repair  ~ 2010    left  . Abdominal hysterectomy  2001    "partial"  . 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 Total knee arthroplasty  08/23/2012    Procedure: TOTAL KNEE ARTHROPLASTY;  Surgeon: Lorn Junes, MD;  Location: [facility];  Service: Orthopedics;  Laterality: Right;  RIGHT KNEE ARTHROPLASTY MEDIAL AND LATERAL COMPARTMENTS WITH PATELLA RESURFACING    FAMILY HISTORY: Family History  Problem Relation Age of Onset  . Heart disease Maternal Grandfather   . Diabetes Maternal Grandfather   . Heart disease      uncle  . Diabetes      uncles/aunts  . Breast cancer Maternal Aunt   . Alcohol abuse Father   . Lung cancer Father     was a smoker  . Emphysema Father     was a smoker  . Bipolar disorder Son   . Muscular dystrophy Son   . Heart disease Sister   . Colon cancer Neg Hx   . Lung cancer Mother     was a smoker  . Stroke Mother   . Other Sister     blood clotting disorder  . Heart disease Son     SOCIAL HISTORY: History   Social History  .  Marital Status: Married    Spouse Name: [name]    Number of Children: 2  . Years of Education: 12   Occupational History  . disabled    Social History Main Topics  . Smoking status: Current Every Day Smoker -- 0.50 packs/day for 36 years    Types: Cigarettes    Start date: 08/12/1975  . Smokeless tobacco: Former Systems developer    Quit date: 08/23/2012     Comment: started back smoking in 11/2012  . Alcohol Use: No  . Drug Use: No  . Sexual Activity: No   Other Topics Concern  . Not on file   Social History Narrative   Patient lives at home with husband [name], her son and his wife.    Patient has 2 children.    Patient has a high school education.    Patient is currently unemployed.    Patient is right handed.            PHYSICAL EXAM  Filed Vitals:   06/20/14 1516  BP: 114/68  Pulse: 84  Height: '5\' 1"'  (1.549 m)  Weight: 189 lb (85.73 kg)   Body mass index is 35.73 kg/(m^2). Generalized: Well developed, obese female in no acute distress  Head: normocephalic and atraumatic,. Oropharynx benign  Neck: Supple, no carotid bruits  Cardiac: Regular rate rhythm, no murmur  Musculoskeletal: No deformity   Neurological examination   Mentation: Alert oriented to time, place, history taking. Follows all commands speech and language fluent  Cranial nerve II-XII: Pupils were equal round reactive to light extraocular movements were full, visual field were full on confrontational test. Facial sensation and strength were normal. hearing was intact to finger rubbing bilaterally. Uvula tongue midline. head turning and shoulder shrug were normal and symmetric.Tongue protrusion into cheek strength was normal. Motor: normal bulk and tone,  full strength in the BUE, BLE, fine finger movements normal, no pronator drift. No focal weakness Sensory: normal and symmetric to light touch, pinprick, and vibration  Coordination: finger-nose-finger, heel-to-shin bilaterally, no dysmetria Reflexes:  Brachioradialis 2/2, biceps 2/2, triceps 2/2, patellar 2/2, Achilles 2/2, plantar responses were flexor bilaterally. Gait and Station: Rising up from seated position without assistance, normal stance, moderate stride, good arm swing, smooth turning, able to perform tiptoe, and heel walking without difficulty. Tandem gait is unsteady. Patient without assistive device DIAGNOSTIC DATA (LABS, IMAGING, TESTING) - I reviewed patient records, labs, notes, testing and imaging myself where available.  Lab Results  Component Value Date   WBC 7.8 06/19/2014   HGB 11.6* 06/19/2014   HCT 33.5* 06/19/2014   MCV 89.3 06/19/2014   PLT 220 06/19/2014      Component Value Date/Time   NA 137 06/19/2014 1656   K 4.4 06/19/2014 1656   CL 107 06/19/2014 1656   CO2 26 06/19/2014 1656   GLUCOSE 79 06/19/2014 1656   BUN 17 06/19/2014 1656   CREATININE 0.89 06/19/2014 1656   CREATININE 0.99 11/20/2013 1846   CALCIUM 9.2 06/19/2014 1656   PROT 6.6 06/19/2014 1656   ALBUMIN 3.9 06/19/2014 1656   AST 10 06/19/2014 1656   ALT <8 06/19/2014 1656   ALKPHOS 111 06/19/2014 1656   BILITOT 0.2 06/19/2014 1656   GFRNONAA 63* 11/20/2013 1846   GFRAA 72* 11/20/2013 1846   Lab Results  Component Value Date   CHOL 148 06/19/2014   HDL 52 06/19/2014   LDLCALC 61 06/19/2014   LDLDIRECT 77 09/14/2012   TRIG 176* 06/19/2014   CHOLHDL 2.8 06/19/2014   Lab Results  Component Value Date   HGBA1C 5.7 06/19/2014    Lab Results  Component Value Date   TSH 2.314 06/19/2014      ASSESSMENT AND PLAN  58 y.o. year old female  has a past medical history of Allergic rhinitis; Hypertension; Hyperlipidemia; Neck pain;  Esophageal stricture; COPD (chronic obstructive pulmonary disease); Emphysema;  Chronic bronchitis; Exertional dyspnea;  Migraine; Epilepsy; OSA on CPAP;  Here to follow up for headaches and seizure disorder.   Will check Lamictal level today 06/19/14 CBC and CMP reviewed.  Continue Lamictal 156m  BID, does not need refills ContinueTopamax 300 mg total dose daily, does not need refills F/U in 6 months NDennie Bible [facility] APRN  [facility] [address] [place] [phone]

## 2014-06-20 NOTE — Patient Instructions (Signed)
Will check Lamictal level today Continue Lamictal and Topamax at current dose F/U in 6 months

## 2014-06-20 NOTE — Progress Notes (Signed)
I have read the note, and I agree with the clinical assessment and plan.  Langston [place] KEITH   

## 2014-06-21 LAB — LAMOTRIGINE LEVEL: Lamotrigine Lvl: 3.2 ug/mL (ref 2.0–20.0)

## 2014-06-21 MED ORDER — FLUTICASONE PROPIONATE 50 MCG/ACT NA SUSP: 2.0000 | Freq: Every day | NASAL | Status: DC | PRN

## 2014-06-21 MED ORDER — SERTRALINE HCL 50 MG PO TABS: 50.0000 mg | ORAL_TABLET | Freq: Every day | ORAL | Status: DC

## 2014-06-22 NOTE — Assessment & Plan Note (Signed)
Will give toradol today per patient request. Pt has f/u appt tomorrow 11/10 and will hopefully have some change to her chronic medication management as she is frequently seen in our clinic with headaches. Still has yet to bring headache log into one of our visits.

## 2014-06-22 NOTE — Assessment & Plan Note (Addendum)
Seems to have another exacerbation. No hypoxia on resting pulse ox today.  - obtain cxr  - treat presumptively with 5 days of prednisone - avoid abx unless has proven infiltrate on xray, as she has limited abx options due to allergies/intolerances and she has frequent exacerbations. - increase strength of dulera to 200-5 2 puffs BID

## 2014-06-22 NOTE — Assessment & Plan Note (Signed)
Check lipids & CMET today. 

## 2014-06-22 NOTE — Assessment & Plan Note (Signed)
Check vitamin D level today & replete as needed based on that result.

## 2014-06-26 ENCOUNTER — Ambulatory Visit (HOSPITAL_COMMUNITY): Payer: Medicaid Other | Attending: Emergency Medicine

## 2014-06-26 ENCOUNTER — Emergency Department (INDEPENDENT_AMBULATORY_CARE_PROVIDER_SITE_OTHER)
Admission: EM | Admit: 2014-06-26 | Discharge: 2014-06-26 | Disposition: A | Discharge status: Home / Self Care | Payer: Medicaid Other | Source: Home / Self Care | Attending: Emergency Medicine | Admitting: Emergency Medicine

## 2014-06-26 ENCOUNTER — Encounter (HOSPITAL_COMMUNITY): Payer: Self-pay | Admitting: Emergency Medicine

## 2014-06-26 DIAGNOSIS — R0602 Shortness of breath: Secondary | ICD-10-CM | POA: Insufficient documentation

## 2014-06-26 DIAGNOSIS — R059 Cough, unspecified: Secondary | ICD-10-CM

## 2014-06-26 DIAGNOSIS — J449 Chronic obstructive pulmonary disease, unspecified: Secondary | ICD-10-CM | POA: Insufficient documentation

## 2014-06-26 DIAGNOSIS — J40 Bronchitis, not specified as acute or chronic: Secondary | ICD-10-CM

## 2014-06-26 DIAGNOSIS — R05 Cough: Secondary | ICD-10-CM | POA: Insufficient documentation

## 2014-06-26 DIAGNOSIS — R079 Chest pain, unspecified: Secondary | ICD-10-CM | POA: Diagnosis not present

## 2014-06-26 DIAGNOSIS — I10 Essential (primary) hypertension: Secondary | ICD-10-CM | POA: Diagnosis not present

## 2014-06-26 MED ORDER — KETOROLAC TROMETHAMINE 60 MG/2ML IM SOLN
INTRAMUSCULAR | Status: AC
  Filled 2014-06-26: qty 2

## 2014-06-26 MED ORDER — LEVOFLOXACIN 500 MG PO TABS: 500.0000 mg | ORAL_TABLET | Freq: Every day | ORAL | Status: DC

## 2014-06-26 MED ORDER — KETOROLAC TROMETHAMINE 60 MG/2ML IM SOLN
60.0000 mg | Freq: Once | INTRAMUSCULAR | Status: AC
  Administered 2014-06-26: 60 mg via INTRAMUSCULAR

## 2014-06-26 MED ORDER — PREDNISONE 10 MG PO TABS: ORAL_TABLET | ORAL | Status: DC

## 2014-06-26 NOTE — Discharge Instructions (Signed)
Take the prednisone as prescribed. Use your albuterol as needed. If you are not improving by Thursday, fill the Levaquin.  Follow up as needed.

## 2014-06-26 NOTE — ED Notes (Signed)
Cough for one week, patient thinks she has bronchitis.  Dr Ardelia Mems (pcp) started patient on prednisone. Takes last dose tomorrow.  Patient does not think she is improving.  Reports cough keeping her awake.  C/o headache

## 2014-06-26 NOTE — ED Notes (Signed)
Patient went to [facility]ology department for xray

## 2014-06-26 NOTE — ED Provider Notes (Signed)
CSN: [id number]     Arrival date & time 06/26/14  1846 History   First MD Initiated Contact with Patient 06/26/14 1919     Chief Complaint  Patient presents with  . Cough   (Consider location/radiation/quality/duration/timing/severity/associated sxs/prior Treatment) HPI [name] is a 58 year old woman here for evaluation of cough. [name] has COPD and frequent bronchitis exacerbation. [name] states her current symptoms started about a week ago with increased cough and sputum production. [name] has been using her albuterol nebulizer more than normal. [name] did see her primary care provider who prescribed prednisone for her. [name] has been taking the prednisone, but reports minimal improvement. [name] denies any nasal symptoms. No fevers or chills. [name] does describe a headache that started about a week ago and has gradually been getting worse. It is located throughout her head. It is worse with coughing. No focal numbness, tingling, weakness.  Past Medical History  Diagnosis Date  . Allergic rhinitis   . Hypertension   . Hyperlipidemia   . GERD (gastroesophageal reflux disease)   . Glaucoma   . Neck pain     Cervical disc  . IBS (irritable bowel syndrome)   . Esophageal stricture   . Diverticulosis   . Right leg DVT 1982    groin  . COPD (chronic obstructive pulmonary disease)   . Emphysema   . Pneumonia 2012; 04/27/2012  . Chronic bronchitis     "keep it all the time" (04/27/2012)  . Exertional dyspnea   . Type II diabetes mellitus     No meds since weight loss  . Migraine   . Epilepsy   . DJD (degenerative joint disease)   . Arthritis     "hands and legs and feet" (04/27/2012)  . Depression   . OSA on CPAP     6-7 yrs  . UTI (urinary tract infection)     2 weeks ago  . Cancer 05    rt arm mole rem cancer   Past Surgical History  Procedure Laterality Date  . Tubal ligation  2001  . Knee arthroscopy  1990's    left  . Carpal tunnel release  ~ 2008    right  . Shoulder open rotator cuff  repair  ~ 2010    left  . Abdominal hysterectomy  2001    "partial"  . 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 Total knee arthroplasty  08/23/2012    Procedure: TOTAL KNEE ARTHROPLASTY;  Surgeon: Lorn Junes, MD;  Location: [facility];  Service: Orthopedics;  Laterality: Right;  RIGHT KNEE ARTHROPLASTY MEDIAL AND LATERAL COMPARTMENTS WITH PATELLA RESURFACING   Family History  Problem Relation Age of Onset  . Heart disease Maternal Grandfather   . Diabetes Maternal Grandfather   . Heart disease      uncle  . Diabetes      uncles/aunts  . Breast cancer Maternal Aunt   . Alcohol abuse Father   . Lung cancer Father     was a smoker  . Emphysema Father     was a smoker  . Bipolar disorder Son   . Muscular dystrophy Son   . Heart disease Sister   . Colon cancer Neg Hx   . Lung cancer Mother     was a smoker  . Stroke Mother   . Other Sister     blood clotting disorder  . Heart disease Son    History  Substance Use Topics  . Smoking status: Current  Every Day Smoker -- 0.50 packs/day for 36 years    Types: Cigarettes    Start date: 08/12/1975  . Smokeless tobacco: Former Systems developer    Quit date: 08/23/2012     Comment: started back smoking in 11/2012  . Alcohol Use: No   OB History    No data available     Review of Systems As in history of present illness. Allergies  Amoxicillin; Azithromycin; Doxycycline hyclate; Penicillins; Sulfonamide derivatives; Chantix; Clarithromycin; Nortriptyline; and Fluticasone-salmeterol  Home Medications   Prior to Admission medications   Medication Sig Start Date End Date Taking? Authorizing Provider  acetaminophen (TYLENOL) 325 MG tablet Take 2 tablets (650 mg total) by mouth every 6 (six) hours as needed for pain or fever (greater than 101). For pain 09/06/12   Donita Brooks, NP  albuterol (PROAIR HFA) 108 (90 BASE) MCG/ACT inhaler Inhale 2 puffs into the lungs every 6 (six) hours as needed. Shortness of breath 04/21/14   Leeanne Rio, MD  albuterol (PROVENTIL) (2.5 MG/3ML) 0.083% nebulizer solution Take 3 mLs (2.5 mg total) by nebulization every 6 (six) hours as needed. For shortness of breath    Leeanne Rio, MD  aspirin EC 81 MG tablet Take 1 tablet (81 mg total) by mouth daily. 10/04/12   Leeanne Rio, MD  Cholecalciferol (CVS VITAMIN D) 2000 UNITS CAPS TAKE ONE CAPSULE BY MOUTH EVERY DAY 06/19/14   Leeanne Rio, MD  ferrous sulfate 325 (65 FE) MG tablet Take 1 tablet (325 mg total) by mouth 2 (two) times daily with a meal. 09/06/12   Donita Brooks, NP  fluticasone (FLONASE) 50 MCG/ACT nasal spray Place 2 sprays into both nostrils daily as needed. For congestion. 06/21/14   Leeanne Rio, MD  guaiFENesin (MUCINEX) 600 MG 12 hr tablet Take 1 tablet (600 mg total) by mouth 2 (two) times daily. 03/21/14   Renee A Kuneff, DO  lamoTRIgine (LAMICTAL) 150 MG tablet Take 1 tablet (150 mg total) by mouth 2 (two) times daily. 11/24/13   Dennie Bible, NP  levofloxacin (LEVAQUIN) 500 MG tablet Take 1 tablet (500 mg total) by mouth daily. 06/26/14   Melony Overly, MD  Loratadine 10 MG CAPS Take 1 capsule (10 mg total) by mouth daily. 05/23/14   Leeanne Rio, MD  mometasone-formoterol [facility]) 200-5 MCG/ACT AERO Inhale 2 puffs into the lungs 2 (two) times daily. 06/19/14   Leeanne Rio, MD  NITROFURANTOIN PO Take by mouth.    Historical Provider, MD  omeprazole (PRILOSEC) 40 MG capsule Take 1 capsule (40 mg total) by mouth 2 (two) times daily. 01/31/14   Leeanne Rio, MD  predniSONE (DELTASONE) 10 MG tablet Take 3 pill for 5 days, then 2 pills for 3 days, then 1 pill for 3 days. 06/26/14   Melony Overly, MD  sertraline (ZOLOFT) 50 MG tablet Take 1 tablet (50 mg total) by mouth daily. 06/21/14   Leeanne Rio, MD  simvastatin (ZOCOR) 40 MG tablet Take 1 tablet (40 mg total) by mouth at bedtime. 04/21/14   Leeanne Rio, MD  tiotropium (SPIRIVA) 18 MCG inhalation capsule  Place 1 capsule (18 mcg total) into inhaler and inhale daily. 04/21/14   Leeanne Rio, MD  topiramate (TOPAMAX) 100 MG tablet 1tab in the am, 2 at bedtime 11/24/13   Dennie Bible, NP   BP 106/76 mmHg  Pulse 83  Temp(Src) 98.1 F (36.7 C) (Oral)  Resp 18  SpO2 94% Physical Exam  Constitutional: [name] is oriented to person, place, and time. [name] appears well-developed and well-nourished. No distress.  Cardiovascular: Normal rate, regular rhythm and normal heart sounds.   No murmur heard. Pulmonary/Chest: Effort normal. No respiratory distress. [name] has no wheezes. [name] has no rales.  Lymphadenopathy:    [name] has no cervical adenopathy.  Neurological: [name] is alert and oriented to person, place, and time.    ED Course  Procedures (including critical care time) Labs Review Labs Reviewed - No data to display  Imaging Review Dg Chest 2 View  06/26/2014   CLINICAL DATA:  Shortness of breath, chest pain, cough and congestion for 1 week, partial history of hypertension, GERD, COPD, emphysema, diabetes, smoking, diastolic CHF, pulmonary hypertension  EXAM: CHEST  2 VIEW  COMPARISON:  03/21/2014  FINDINGS: Normal heart size, mediastinal contours, and pulmonary vascularity.  Minimal chronic bronchitic changes and hyperinflation.  No acute infiltrate, pleural effusion or pneumothorax.  Bones unremarkable.  IMPRESSION: Minimal chronic bronchitic changes and hyperinflation.  No acute abnormalities.   Electronically Signed   By: Lavonia Dana M.D.   On: 06/26/2014 20:26     MDM   1. Bronchitis   2. Cough    Toradol 60 mg IM given for headache.  Chest x-rays negative for pneumonia. We will do a prednisone taper as 60 mg for 5 days, 40 mg for 3 days, 20 mg for 3 days. If [name] is not improving by Thursday, I provided a prescription for Levaquin that [name] can fill at that time. Follow-up as needed.    Melony Overly, MD 06/26/14 [phone]

## 2014-07-04 ENCOUNTER — Telehealth: Payer: Self-pay | Admitting: Nurse Practitioner

## 2014-07-04 NOTE — Telephone Encounter (Signed)
Form has been completed and faxed to Medicaid.  I called the pharmacy back, they are aware.

## 2014-07-04 NOTE — Telephone Encounter (Signed)
[name] with [facility] pharmacy @ [phone], stated Medicaid requires safety documentation for Rx QUETIAPINE 50 Mg.  Please call and advise.

## 2014-07-19 ENCOUNTER — Other Ambulatory Visit: Payer: Self-pay | Admitting: *Deleted

## 2014-07-19 MED ORDER — OMEPRAZOLE 40 MG PO CPDR: 40.0000 mg | DELAYED_RELEASE_CAPSULE | Freq: Two times a day (BID) | ORAL | Status: DC

## 2014-07-24 ENCOUNTER — Encounter: Payer: Self-pay | Admitting: Family Medicine

## 2014-07-24 ENCOUNTER — Ambulatory Visit (INDEPENDENT_AMBULATORY_CARE_PROVIDER_SITE_OTHER): Payer: Medicaid Other | Admitting: Family Medicine

## 2014-07-24 VITALS — BP 128/64 | HR 95 | Temp 98.2°F | Wt 187.0 lb

## 2014-07-24 DIAGNOSIS — G43009 Migraine without aura, not intractable, without status migrainosus: Secondary | ICD-10-CM

## 2014-07-24 DIAGNOSIS — G4730 Sleep apnea, unspecified: Secondary | ICD-10-CM

## 2014-07-24 DIAGNOSIS — G4710 Hypersomnia, unspecified: Secondary | ICD-10-CM

## 2014-07-24 DIAGNOSIS — H8113 Benign paroxysmal vertigo, bilateral: Secondary | ICD-10-CM

## 2014-07-24 MED ORDER — KETOROLAC TROMETHAMINE 60 MG/2ML IM SOLN: 60.0000 mg | Freq: Once | INTRAMUSCULAR | Status: DC

## 2014-07-24 MED ORDER — MECLIZINE HCL 12.5 MG PO TABS: 25.0000 mg | ORAL_TABLET | Freq: Three times a day (TID) | ORAL | Status: DC | PRN

## 2014-07-24 MED ORDER — KETOROLAC TROMETHAMINE 60 MG/2ML IM SOLN
60.0000 mg | Freq: Once | INTRAMUSCULAR | Status: AC
  Administered 2014-07-24: 60 mg via INTRAMUSCULAR

## 2014-07-24 NOTE — Progress Notes (Signed)
   Subjective:    Patient ID: [name], female    DOB: 1956/03/22, 58 y.o.   MRN: [id number]  HPI  Headache: followed by neurology for headache, currently on lamictal and topamax for migraine prevention.  Also rx'd seroquel but stopped taking 2/2 difficulty waking up. - toradol IM helps with headache for about 1 week then HA comes back. - uses cpap nightly - denies hx of vertigo  Fall: while walking in hall to bathroom, felt light headed and fell.  No head trauma.  2nd fall: "felt like I didn't have any legs", fell while walking to living room, had bruise on leg but again no head trauma. - feels dizzy when she stands up (even when she feels light headed), also when turns head,   Reports that she has been very sleepy and difficult to awaken  Review of Systems  Constitutional: Negative for chills and diaphoresis.  Respiratory: Negative for cough and shortness of breath.   Cardiovascular: Negative for leg swelling.  Gastrointestinal: Negative for abdominal pain, diarrhea, constipation and anal bleeding.  Endocrine: Negative for cold intolerance and heat intolerance.  Genitourinary: Negative for dysuria, urgency, decreased urine volume and difficulty urinating.  Neurological: Negative for headaches.       Objective:   Physical Exam  Constitutional: She appears well-developed and well-nourished. No distress.  HENT:  Mouth/Throat: Mucous membranes are moist. Pharynx is normal.  Eyes: Conjunctivae and EOM are normal.  Neck: No adenopathy.  Cardiovascular: Normal rate and S2 normal.   Pulmonary/Chest: Effort normal.  Abdominal: She exhibits no distension. There is no tenderness.  Musculoskeletal: Normal range of motion.  Neurological: She is alert. No cranial nerve deficit (CN II-XII intact). Coordination normal.  +dix hallpike, bilateral  Skin: Skin is warm. No rash noted. She is not diaphoretic. No pallor.   BP 128/64 mmHg  Pulse 95  Temp(Src) 98.2 F (36.8 C) (Oral)  Wt 187  lb (84.823 kg)  SpO2 96%    MRI reviewed and relatively normal, notes from neurology reviewed.    Assessment & Plan:  [name] was seen today for dizzy spells.  Diagnoses and associated orders for this visit:  Migraine without aura and without status migrainosus, not intractable - ketorolac (TORADOL) injection 60 mg; Inject 2 mLs (60 mg total) into the muscle once. - continue follow up with neurology, lamictal/topamax, PO hydration encouraged  Benign paroxysmal positional vertigo, bilateral - meclizine (ANTIVERT) tablet 25 mg; Take 2 tablets (25 mg total) by mouth 3 (three) times daily as needed for dizziness. - fall risk reinforced, discussed risks and benefits of meclizine vs just epley exercises, requests rx and is agreeable to risks.  Hypersomnia with sleep apnea: for reevaluation of cpap settings - Ambulatory referral to Pulmonology - suspect hypersomnia 2/2 incorrect settings, has not been evaluated in 4 years  Merla Riches, MD 4:33 PM

## 2014-07-28 ENCOUNTER — Telehealth: Payer: Self-pay | Admitting: Family Medicine

## 2014-07-28 MED ORDER — CHOLECALCIFEROL 1.25 MG (50000 UT) PO TABS: 1.0000 | ORAL_TABLET | ORAL | Status: DC

## 2014-07-28 NOTE — Telephone Encounter (Signed)
[facility], please call pt and let her know:  her vitamin D level was very low. I recommend she take vitamin D replacement pills (50,000 units weekly). One pill weekly for 8 weeks, stop the daily vitamin D for now. I sent in a prescription for her. After the 8 weeks, we'll need to recheck her vitamin D level and resume her daily vitamin D pill at a lower dose (I'll prescribe this at a future office visit).   Thanks! Leeanne Rio, MD

## 2014-07-31 NOTE — Telephone Encounter (Signed)
Pt returned telephone call.  Msg was given.

## 2014-07-31 NOTE — Telephone Encounter (Signed)
Left message on voicemail for patient to call back. 

## 2014-08-05 ENCOUNTER — Encounter (HOSPITAL_COMMUNITY): Payer: Self-pay | Admitting: Emergency Medicine

## 2014-08-05 ENCOUNTER — Observation Stay (HOSPITAL_COMMUNITY)
Admission: EM | Admit: 2014-08-05 | Discharge: 2014-08-07 | Disposition: A | Discharge status: Home / Self Care | Payer: Medicaid Other | Attending: Family Medicine | Admitting: Family Medicine

## 2014-08-05 ENCOUNTER — Emergency Department (HOSPITAL_COMMUNITY): Payer: Medicaid Other

## 2014-08-05 DIAGNOSIS — Z79899 Other long term (current) drug therapy: Secondary | ICD-10-CM | POA: Insufficient documentation

## 2014-08-05 DIAGNOSIS — M25562 Pain in left knee: Secondary | ICD-10-CM | POA: Diagnosis not present

## 2014-08-05 DIAGNOSIS — R55 Syncope and collapse: Secondary | ICD-10-CM | POA: Diagnosis present

## 2014-08-05 DIAGNOSIS — E876 Hypokalemia: Secondary | ICD-10-CM | POA: Insufficient documentation

## 2014-08-05 DIAGNOSIS — F17210 Nicotine dependence, cigarettes, uncomplicated: Secondary | ICD-10-CM | POA: Diagnosis present

## 2014-08-05 DIAGNOSIS — H409 Unspecified glaucoma: Secondary | ICD-10-CM | POA: Insufficient documentation

## 2014-08-05 DIAGNOSIS — E785 Hyperlipidemia, unspecified: Secondary | ICD-10-CM | POA: Diagnosis not present

## 2014-08-05 DIAGNOSIS — J441 Chronic obstructive pulmonary disease with (acute) exacerbation: Secondary | ICD-10-CM | POA: Diagnosis not present

## 2014-08-05 DIAGNOSIS — R054 Cough syncope: Secondary | ICD-10-CM | POA: Insufficient documentation

## 2014-08-05 DIAGNOSIS — I10 Essential (primary) hypertension: Secondary | ICD-10-CM | POA: Insufficient documentation

## 2014-08-05 DIAGNOSIS — E119 Type 2 diabetes mellitus without complications: Secondary | ICD-10-CM | POA: Insufficient documentation

## 2014-08-05 DIAGNOSIS — K219 Gastro-esophageal reflux disease without esophagitis: Secondary | ICD-10-CM | POA: Insufficient documentation

## 2014-08-05 DIAGNOSIS — R569 Unspecified convulsions: Secondary | ICD-10-CM | POA: Insufficient documentation

## 2014-08-05 DIAGNOSIS — Z880 Allergy status to penicillin: Secondary | ICD-10-CM | POA: Diagnosis not present

## 2014-08-05 DIAGNOSIS — M1990 Unspecified osteoarthritis, unspecified site: Secondary | ICD-10-CM | POA: Diagnosis not present

## 2014-08-05 DIAGNOSIS — R059 Cough, unspecified: Secondary | ICD-10-CM

## 2014-08-05 DIAGNOSIS — F329 Major depressive disorder, single episode, unspecified: Secondary | ICD-10-CM | POA: Insufficient documentation

## 2014-08-05 DIAGNOSIS — Z882 Allergy status to sulfonamides status: Secondary | ICD-10-CM | POA: Insufficient documentation

## 2014-08-05 DIAGNOSIS — K5790 Diverticulosis of intestine, part unspecified, without perforation or abscess without bleeding: Secondary | ICD-10-CM | POA: Diagnosis not present

## 2014-08-05 DIAGNOSIS — G4733 Obstructive sleep apnea (adult) (pediatric): Secondary | ICD-10-CM | POA: Diagnosis not present

## 2014-08-05 DIAGNOSIS — Z888 Allergy status to other drugs, medicaments and biological substances status: Secondary | ICD-10-CM | POA: Diagnosis not present

## 2014-08-05 DIAGNOSIS — Z7982 Long term (current) use of aspirin: Secondary | ICD-10-CM | POA: Insufficient documentation

## 2014-08-05 DIAGNOSIS — G40909 Epilepsy, unspecified, not intractable, without status epilepticus: Secondary | ICD-10-CM | POA: Insufficient documentation

## 2014-08-05 DIAGNOSIS — K589 Irritable bowel syndrome without diarrhea: Secondary | ICD-10-CM | POA: Insufficient documentation

## 2014-08-05 DIAGNOSIS — G43009 Migraine without aura, not intractable, without status migrainosus: Secondary | ICD-10-CM | POA: Diagnosis not present

## 2014-08-05 DIAGNOSIS — R05 Cough: Secondary | ICD-10-CM | POA: Insufficient documentation

## 2014-08-05 LAB — URINALYSIS, ROUTINE W REFLEX MICROSCOPIC
Glucose, UA: NEGATIVE mg/dL
Hgb urine dipstick: NEGATIVE
KETONES UR: NEGATIVE mg/dL
NITRITE: NEGATIVE
Protein, ur: 30 mg/dL — AB
Specific Gravity, Urine: 1.027 (ref 1.005–1.030)
UROBILINOGEN UA: 0.2 mg/dL (ref 0.0–1.0)
pH: 6 (ref 5.0–8.0)

## 2014-08-05 LAB — BASIC METABOLIC PANEL
Anion gap: 7 (ref 5–15)
BUN: 20 mg/dL (ref 6–23)
CHLORIDE: 112 meq/L (ref 96–112)
CO2: 23 mmol/L (ref 19–32)
Calcium: 8.9 mg/dL (ref 8.4–10.5)
Creatinine, Ser: 1.01 mg/dL (ref 0.50–1.10)
GFR calc Af Amer: 71 mL/min — ABNORMAL LOW (ref >90)
GFR calc non Af Amer: 61 mL/min — ABNORMAL LOW (ref >90)
GLUCOSE: 115 mg/dL — AB (ref 70–99)
POTASSIUM: 3.4 mmol/L — AB (ref 3.5–5.1)
SODIUM: 142 mmol/L (ref 135–145)

## 2014-08-05 LAB — URINE MICROSCOPIC-ADD ON

## 2014-08-05 LAB — CBC WITH DIFFERENTIAL/PLATELET
Basophils Absolute: 0 10*3/uL (ref 0.0–0.1)
Basophils Relative: 0 % (ref 0–1)
EOS ABS: 0.2 10*3/uL (ref 0.0–0.7)
Eosinophils Relative: 1 % (ref 0–5)
HCT: 33.8 % — ABNORMAL LOW (ref 36.0–46.0)
HEMOGLOBIN: 11.2 g/dL — AB (ref 12.0–15.0)
LYMPHS ABS: 2.9 10*3/uL (ref 0.7–4.0)
Lymphocytes Relative: 23 % (ref 12–46)
MCH: 31.1 pg (ref 26.0–34.0)
MCHC: 33.1 g/dL (ref 30.0–36.0)
MCV: 93.9 fL (ref 78.0–100.0)
Monocytes Absolute: 0.7 10*3/uL (ref 0.1–1.0)
Monocytes Relative: 5 % (ref 3–12)
NEUTROS ABS: 8.8 10*3/uL — AB (ref 1.7–7.7)
NEUTROS PCT: 71 % (ref 43–77)
PLATELETS: 201 10*3/uL (ref 150–400)
RBC: 3.6 MIL/uL — AB (ref 3.87–5.11)
RDW: 13.7 % (ref 11.5–15.5)
WBC: 12.6 10*3/uL — ABNORMAL HIGH (ref 4.0–10.5)

## 2014-08-05 LAB — I-STAT TROPONIN, ED: TROPONIN I, POC: 0.02 ng/mL (ref 0.00–0.08)

## 2014-08-05 MED ORDER — TIOTROPIUM BROMIDE MONOHYDRATE 18 MCG IN CAPS
18.0000 ug | ORAL_CAPSULE | Freq: Every day | RESPIRATORY_TRACT | Status: DC
  Administered 2014-08-06 – 2014-08-07 (×2): 18 ug via RESPIRATORY_TRACT
  Filled 2014-08-05: qty 5

## 2014-08-05 MED ORDER — PANTOPRAZOLE SODIUM 40 MG PO TBEC
80.0000 mg | DELAYED_RELEASE_TABLET | Freq: Every day | ORAL | Status: DC
  Administered 2014-08-06 – 2014-08-07 (×2): 80 mg via ORAL
  Filled 2014-08-05: qty 2

## 2014-08-05 MED ORDER — LORATADINE 10 MG PO CAPS: 10.0000 mg | ORAL_CAPSULE | Freq: Every day | ORAL | Status: DC

## 2014-08-05 MED ORDER — TOPIRAMATE 100 MG PO TABS: 100.0000 mg | ORAL_TABLET | Freq: Two times a day (BID) | ORAL | Status: DC

## 2014-08-05 MED ORDER — HEPARIN SODIUM (PORCINE) 5000 UNIT/ML IJ SOLN
5000.0000 [IU] | Freq: Three times a day (TID) | INTRAMUSCULAR | Status: DC
  Administered 2014-08-05 – 2014-08-07 (×5): 5000 [IU] via SUBCUTANEOUS
  Filled 2014-08-05 (×9): qty 1

## 2014-08-05 MED ORDER — SODIUM CHLORIDE 0.9 % IJ SOLN
3.0000 mL | Freq: Two times a day (BID) | INTRAMUSCULAR | Status: DC
  Administered 2014-08-05 – 2014-08-07 (×4): 3 mL via INTRAVENOUS

## 2014-08-05 MED ORDER — FLUTICASONE PROPIONATE 50 MCG/ACT NA SUSP
2.0000 | Freq: Every day | NASAL | Status: DC
  Administered 2014-08-06 – 2014-08-07 (×2): 2 via NASAL
  Filled 2014-08-05: qty 16

## 2014-08-05 MED ORDER — IPRATROPIUM-ALBUTEROL 0.5-2.5 (3) MG/3ML IN SOLN
5.0000 mL | Freq: Once | RESPIRATORY_TRACT | Status: AC
  Administered 2014-08-05: 5 mL via RESPIRATORY_TRACT
  Filled 2014-08-05: qty 3

## 2014-08-05 MED ORDER — ACETAMINOPHEN 325 MG PO TABS
650.0000 mg | ORAL_TABLET | Freq: Four times a day (QID) | ORAL | Status: DC | PRN
  Administered 2014-08-05 – 2014-08-06 (×2): 650 mg via ORAL
  Filled 2014-08-05 (×2): qty 2

## 2014-08-05 MED ORDER — SERTRALINE HCL 50 MG PO TABS
50.0000 mg | ORAL_TABLET | Freq: Every day | ORAL | Status: DC
  Administered 2014-08-06 – 2014-08-07 (×2): 50 mg via ORAL
  Filled 2014-08-05 (×3): qty 1

## 2014-08-05 MED ORDER — SODIUM CHLORIDE 0.9 % IV SOLN: 250.0000 mL | INTRAVENOUS | Status: DC | PRN

## 2014-08-05 MED ORDER — TOPIRAMATE 100 MG PO TABS
100.0000 mg | ORAL_TABLET | Freq: Every day | ORAL | Status: DC
  Administered 2014-08-06 – 2014-08-07 (×2): 100 mg via ORAL
  Filled 2014-08-05 (×2): qty 1

## 2014-08-05 MED ORDER — SODIUM CHLORIDE 0.9 % IJ SOLN
3.0000 mL | Freq: Two times a day (BID) | INTRAMUSCULAR | Status: DC
  Administered 2014-08-05: 3 mL via INTRAVENOUS

## 2014-08-05 MED ORDER — LAMOTRIGINE 150 MG PO TABS
150.0000 mg | ORAL_TABLET | Freq: Two times a day (BID) | ORAL | Status: DC
  Administered 2014-08-05 – 2014-08-07 (×4): 150 mg via ORAL
  Filled 2014-08-05 (×5): qty 1

## 2014-08-05 MED ORDER — TOPIRAMATE 100 MG PO TABS
200.0000 mg | ORAL_TABLET | Freq: Every day | ORAL | Status: DC
  Administered 2014-08-05 – 2014-08-06 (×2): 200 mg via ORAL
  Filled 2014-08-05 (×3): qty 2

## 2014-08-05 MED ORDER — SODIUM CHLORIDE 0.9 % IJ SOLN: 3.0000 mL | INTRAMUSCULAR | Status: DC | PRN

## 2014-08-05 MED ORDER — FERROUS SULFATE 325 (65 FE) MG PO TABS
325.0000 mg | ORAL_TABLET | Freq: Two times a day (BID) | ORAL | Status: DC
  Administered 2014-08-05 – 2014-08-07 (×4): 325 mg via ORAL
  Filled 2014-08-05 (×6): qty 1

## 2014-08-05 MED ORDER — ASPIRIN EC 81 MG PO TBEC
81.0000 mg | DELAYED_RELEASE_TABLET | Freq: Every day | ORAL | Status: DC
  Administered 2014-08-06 – 2014-08-07 (×2): 81 mg via ORAL
  Filled 2014-08-05 (×3): qty 1

## 2014-08-05 MED ORDER — PREDNISONE 20 MG PO TABS
60.0000 mg | ORAL_TABLET | Freq: Once | ORAL | Status: AC
  Administered 2014-08-05: 60 mg via ORAL
  Filled 2014-08-05: qty 3

## 2014-08-05 MED ORDER — LORATADINE 10 MG PO TABS
10.0000 mg | ORAL_TABLET | Freq: Every day | ORAL | Status: DC
  Administered 2014-08-06 – 2014-08-07 (×2): 10 mg via ORAL
  Filled 2014-08-05 (×2): qty 1

## 2014-08-05 MED ORDER — SIMVASTATIN 40 MG PO TABS
40.0000 mg | ORAL_TABLET | Freq: Every day | ORAL | Status: DC
  Administered 2014-08-05 – 2014-08-06 (×2): 40 mg via ORAL
  Filled 2014-08-05 (×3): qty 1

## 2014-08-05 MED ORDER — ALBUTEROL SULFATE (2.5 MG/3ML) 0.083% IN NEBU: 3.0000 mL | INHALATION_SOLUTION | Freq: Four times a day (QID) | RESPIRATORY_TRACT | Status: DC | PRN

## 2014-08-05 MED ORDER — MOMETASONE FURO-FORMOTEROL FUM 200-5 MCG/ACT IN AERO
2.0000 | INHALATION_SPRAY | Freq: Two times a day (BID) | RESPIRATORY_TRACT | Status: DC
  Administered 2014-08-05 – 2014-08-07 (×4): 2 via RESPIRATORY_TRACT
  Filled 2014-08-05: qty 8.8

## 2014-08-05 NOTE — ED Notes (Addendum)
Attempted report. Charge rn not available for report either.

## 2014-08-05 NOTE — ED Notes (Addendum)
Pt has had 3 episodes today where she is unresponsive. Pt reports set off by coughing. Hx of seizures. Pt is confused and incontinent when aroused. Smoker. Told these episodes are due to her COPD. Called EMS due to fact they are coming more frequently. 92% on RA.

## 2014-08-05 NOTE — ED Notes (Signed)
Attempted report 

## 2014-08-05 NOTE — ED Provider Notes (Signed)
CSN: [id number]     Arrival date & time 08/05/14  1218 History   First MD Initiated Contact with Patient 08/05/14 1220     Chief Complaint  Patient presents with  . Loss of Consciousness     (Consider location/radiation/quality/duration/timing/severity/associated sxs/prior Treatment) HPI Comments: The patient is a 58 year old female past medical history of COPD, seizure disorder, hypertension, presents emergency room chief complaint of syncope and seizure today. Patient reports multiple episodes of syncope over the last 5 days. She reports 3-5 episodes reports all episodes have been posttussis. She reports falling on the floor several days ago with pain in the left knee she reports she is able to ambulate with mild pain. She reports one syncopal episode with questionable seizure activity today, witnessed by husband. Patient reports seizure episode lasted approximately 5 minutes with generalized shaking. Last seizure several months ago, denies recent medication change.  Patient reports persistent cough for 3 months. Reports moderate resolution of cough after albuterol given by EMS.  The history is provided by the patient. No language interpreter was used.    Past Medical History  Diagnosis Date  . Allergic rhinitis   . Hypertension   . Hyperlipidemia   . GERD (gastroesophageal reflux disease)   . Glaucoma   . Neck pain     Cervical disc  . IBS (irritable bowel syndrome)   . Esophageal stricture   . Diverticulosis   . Right leg DVT 1982    groin  . COPD (chronic obstructive pulmonary disease)   . Emphysema   . Pneumonia 2012; 04/27/2012  . Chronic bronchitis     "keep it all the time" (04/27/2012)  . Exertional dyspnea   . Type II diabetes mellitus     No meds since weight loss  . Migraine   . Epilepsy   . DJD (degenerative joint disease)   . Arthritis     "hands and legs and feet" (04/27/2012)  . Depression   . OSA on CPAP     6-7 yrs  . UTI (urinary tract infection)     2  weeks ago  . Cancer 05    rt arm mole rem cancer   Past Surgical History  Procedure Laterality Date  . Tubal ligation  2001  . Knee arthroscopy  1990's    left  . Carpal tunnel release  ~ 2008    right  . Shoulder open rotator cuff repair  ~ 2010    left  . Abdominal hysterectomy  2001    "partial"  . 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 Total knee arthroplasty  08/23/2012    Procedure: TOTAL KNEE ARTHROPLASTY;  Surgeon: Lorn Junes, MD;  Location: [facility];  Service: Orthopedics;  Laterality: Right;  RIGHT KNEE ARTHROPLASTY MEDIAL AND LATERAL COMPARTMENTS WITH PATELLA RESURFACING   Family History  Problem Relation Age of Onset  . Heart disease Maternal Grandfather   . Diabetes Maternal Grandfather   . Heart disease      uncle  . Diabetes      uncles/aunts  . Breast cancer Maternal Aunt   . Alcohol abuse Father   . Lung cancer Father     was a smoker  . Emphysema Father     was a smoker  . Bipolar disorder Son   . Muscular dystrophy Son   . Heart disease Sister   . Colon cancer Neg Hx   . Lung cancer Mother     was a smoker  .  Stroke Mother   . Other Sister     blood clotting disorder  . Heart disease Son    History  Substance Use Topics  . Smoking status: Current Every Day Smoker -- 0.50 packs/day for 36 years    Types: Cigarettes    Start date: 08/12/1975  . Smokeless tobacco: Former Systems developer    Quit date: 08/23/2012     Comment: started back smoking in 11/2012  . Alcohol Use: No   OB History    No data available     Review of Systems  Constitutional: Negative for fever and chills.  Respiratory: Positive for cough.   Cardiovascular: Negative for chest pain.  Neurological: Positive for seizures and syncope.      Allergies  Amoxicillin; Azithromycin; Doxycycline hyclate; Penicillins; Sulfonamide derivatives; Chantix; Clarithromycin; Nortriptyline; and Fluticasone-salmeterol  Home Medications   Prior to Admission medications   Medication  Sig Start Date End Date Taking? Authorizing Provider  acetaminophen (TYLENOL) 325 MG tablet Take 2 tablets (650 mg total) by mouth every 6 (six) hours as needed for pain or fever (greater than 101). For pain 09/06/12   Donita Brooks, NP  albuterol (PROAIR HFA) 108 (90 BASE) MCG/ACT inhaler Inhale 2 puffs into the lungs every 6 (six) hours as needed. Shortness of breath 04/21/14   Leeanne Rio, MD  albuterol (PROVENTIL) (2.5 MG/3ML) 0.083% nebulizer solution Take 3 mLs (2.5 mg total) by nebulization every 6 (six) hours as needed. For shortness of breath    Leeanne Rio, MD  aspirin EC 81 MG tablet Take 1 tablet (81 mg total) by mouth daily. 10/04/12   Leeanne Rio, MD  Cholecalciferol ([employer] VITAMIN D) 2000 UNITS CAPS TAKE ONE CAPSULE BY MOUTH EVERY DAY 06/19/14   Leeanne Rio, MD  Cholecalciferol 50000 UNITS TABS Take 1 tablet by mouth once a week. 07/28/14   Leeanne Rio, MD  ferrous sulfate 325 (65 FE) MG tablet Take 1 tablet (325 mg total) by mouth 2 (two) times daily with a meal. 09/06/12   Donita Brooks, NP  fluticasone (FLONASE) 50 MCG/ACT nasal spray Place 2 sprays into both nostrils daily as needed. For congestion. 06/21/14   Leeanne Rio, MD  guaiFENesin (MUCINEX) 600 MG 12 hr tablet Take 1 tablet (600 mg total) by mouth 2 (two) times daily. 03/21/14   Renee A Kuneff, DO  lamoTRIgine (LAMICTAL) 150 MG tablet Take 1 tablet (150 mg total) by mouth 2 (two) times daily. 11/24/13   Dennie Bible, NP  levofloxacin (LEVAQUIN) 500 MG tablet Take 1 tablet (500 mg total) by mouth daily. 06/26/14   Melony Overly, MD  Loratadine 10 MG CAPS Take 1 capsule (10 mg total) by mouth daily. 05/23/14   Leeanne Rio, MD  mometasone-formoterol [facility]) 200-5 MCG/ACT AERO Inhale 2 puffs into the lungs 2 (two) times daily. 06/19/14   Leeanne Rio, MD  NITROFURANTOIN PO Take by mouth.    Historical Provider, MD  omeprazole (PRILOSEC) 40 MG capsule Take 1 capsule  (40 mg total) by mouth 2 (two) times daily. 07/19/14   Leeanne Rio, MD  predniSONE (DELTASONE) 10 MG tablet Take 3 pill for 5 days, then 2 pills for 3 days, then 1 pill for 3 days. 06/26/14   Melony Overly, MD  sertraline (ZOLOFT) 50 MG tablet Take 1 tablet (50 mg total) by mouth daily. 06/21/14   Leeanne Rio, MD  simvastatin (ZOCOR) 40 MG tablet Take 1 tablet (40  mg total) by mouth at bedtime. 04/21/14   Leeanne Rio, MD  tiotropium (SPIRIVA) 18 MCG inhalation capsule Place 1 capsule (18 mcg total) into inhaler and inhale daily. 04/21/14   Leeanne Rio, MD  topiramate (TOPAMAX) 100 MG tablet 1tab in the am, 2 at bedtime 11/24/13   Dennie Bible, NP   BP 103/49 mmHg  Pulse 92  Temp(Src) 98.6 F (37 C)  Resp 28  SpO2 92% Physical Exam  Constitutional: She is oriented to person, place, and time. She appears well-developed and well-nourished. No distress.  HENT:  Head: Normocephalic and atraumatic.  Mouth/Throat: No oropharyngeal exudate.  Eyes: EOM are normal. Pupils are equal, round, and reactive to light.  Neck: Neck supple.  Cardiovascular: Normal rate and regular rhythm.   No lower extremity edema.  Pulmonary/Chest: Effort normal and breath sounds normal. She has no wheezes.  Actively coughing. Decreased lung sounds in all fields. Prolonged expiratory phase.   Abdominal: Soft. Bowel sounds are normal. There is no tenderness. There is no rebound.  Musculoskeletal: Normal range of motion.  Neurological: She is alert and oriented to person, place, and time.  Skin: Skin is warm and dry. She is not diaphoretic.  Psychiatric: She has a normal mood and affect. Her behavior is normal.  Nursing note and vitals reviewed.   ED Course  Procedures (including critical care time) Labs Review Labs Reviewed  CBC WITH DIFFERENTIAL - Abnormal; Notable for the following:    WBC 12.6 (*)    RBC 3.60 (*)    Hemoglobin 11.2 (*)    HCT 33.8 (*)    Neutro Abs 8.8  (*)    All other components within normal limits  BASIC METABOLIC PANEL - Abnormal; Notable for the following:    Potassium 3.4 (*)    Glucose, Bld 115 (*)    GFR calc non Af Amer 61 (*)    GFR calc Af Amer 71 (*)    All other components within normal limits  URINALYSIS, ROUTINE W REFLEX MICROSCOPIC  I-STAT TROPOININ, ED    Imaging Review Dg Chest 2 View  08/05/2014   CLINICAL DATA:  Acute cough and congestion  EXAM: CHEST  2 VIEW  COMPARISON:  06/26/2014  FINDINGS: The heart size and mediastinal contours are within normal limits. Both lungs are clear. The visualized skeletal structures are unremarkable.  IMPRESSION: No active cardiopulmonary disease.   Electronically Signed   By: Daryll Brod M.D.   On: 08/05/2014 14:38     EKG Interpretation   Date/Time:  Saturday August 05 2014 12:26:06 EST Ventricular Rate:  87 PR Interval:  175 QRS Duration: 67 QT Interval:  343 QTC Calculation: 413 R Axis:   61 Text Interpretation:  Sinus rhythm Low voltage, precordial leads No  significant change since last tracing Confirmed by ALLEN  MD, ANTHONY  ([id number]) on 08/05/2014 3:13:09 PM      MDM   Final diagnoses:  Cough  Syncope, tussive  Seizure  Chronic obstructive pulmonary disease with acute exacerbation   Patient with severe COPD presents with cough, syncope and seizure. Patient's blood pressure low in ED, orthostatics ordered. Cough, Sp02 92% RA on arrival, pt reports symptomatic improvement after breathing treatment.  2:51 PM re-evaluation, pt sleeping in room SpO2 87% RA. Discussed plan to admit for syncope, dyspnea and seizure. Discussed patient history, condition with Dr. Jerline Pain, family practice, agrees to admit the patient for syncope, seizure, COPD exacerbation for Dr. Lenon Ahmadi.  Meds given in ED:  Medications  ipratropium-albuterol (DUONEB) 0.5-2.5 (3) MG/3ML nebulizer solution 5 mL (5 mLs Nebulization Given 08/05/14 1306)  predniSONE (DELTASONE) tablet 60 mg (60  mg Oral Given 08/05/14 1509)    New Prescriptions   No medications on file    Harvie Heck, PA-C 08/05/14 Clatonia Allen, MD 08/06/14 [phone]

## 2014-08-05 NOTE — ED Notes (Signed)
Admitting MD at bedside.

## 2014-08-05 NOTE — H&P (Signed)
[facility] Admission History and Physical Service Pager: [phone]  Patient name: [name] Medical record number: [id number] Date of birth: 06-09-1956 Age: 58 y.o. Gender: female  Primary Care Provider: Chrisandra Netters, MD Consultants: None Code Status: Full  Chief Complaint: Syncope, cough  Assessment and Plan: [name] is a 58 y.o. female presenting with syncope . PMH is significant for epilepsy, COPD, HTN, HLD, T2DM, depression, anxiety, OSA  # Syncope. Unclear etiology. Possibly recurrent seizures given her known history of epilepsy and possible post-ictal effects. Can consider vasovagal syncope as the episodes are associated with coughing fits, however patient has no preceding symptoms or warning. Can also consider cardiac etiology given sudden nature of the events, however it would be unusual for such events to last 5-10 minutes as reported. Does not appear to be orthostasis as the symptoms occur regardless of position and orthostatic vitals here were within normal range.  - Will admit to telemetry for observation under attending Dr Erin Hearing - f/u 2d echo - Can consider consulting neurology if symptoms become more consistent with breakthrough seizures or etiology remains unclear  # Epilepsy. No reported seizure for several months. - Home lamictal 150mg  bid - Home topiramate 100mg  qAM and 200mg  qHS  # COPD/Chronic cough/Tobacco abuse. No wheezes to suggest an acute COPD exacerbation. No fevers or chills. Chronic cough likely due to chronic bronchitis, with post nasal drip possibly contributing. Patient continues to smoke 1 pack per day. - Continue home albuterol, spiriva, and dulera - Continue home flonase and loratadine - Supplemental oxygen as needed to keep sats >88% - Offer nicotine patch prn - Consider treatment for acute exacerbation if patient develops significant dyspnea or wheezes. - S/p prednisone 60 mg X 1 in ED, will re-assess need  tomorrow but it seems unlikely  # HLD. Continue home statin  # T2DM. Diet controlled. - A1C 5.7 on 06/19/2014  # Depression/Anxiety. Continue home zoloft.  FEN/GI: Heart healthy diet, SLIV Prophylaxis: SubQ heparin  Disposition: Admitted to telemetry for observation pending above evaluation and management.   History of Present Illness: [name] is a 58 y.o. female presenting with several episodes of syncope for the past several days. Number of episodes has been increasing daily. Episodes usually occur after a coughing fit and the patient is reported to be unresponsive for "5-10 minutes" per the patient's husband who has witnessed these events. Patient has no preceding symptoms or warning signs. Patient has no jerking or shaking movements and no incontinence. Episodes occur regardless of position. After she loses consciousness, she will lay flat and still take 5-10 minutes to regain consciousness. The episodes started occuring randomly recently, without a coughing fit. The patient's husband reports that she is somewhat confused after the episodes then falls asleep. Patient denies having similar symptoms previously.   Patient reports injuring her left leg during one of these episodes and has had increased pain since. Patient reports chronic "locking and popping" in her left knee for several months prior to the injury.   Patient does endorse a chronic cough for the past 3 months with moderately increased sputum production. Patient is not more short of breath than usual. Denies fevers and chills. No chest pain. No abdominal pain. Patient has smoked 1 pack per day since she was 39.   In the ED, patient given duonebs and 1 dose of prednisone. Work up was remarkable for mild hypokalemia (3.4), leukocytosis (12.6) and negative CXR.  Review Of Systems: Per HPI, otherwise  12 point review of systems was performed and was unremarkable.  Patient Active Problem List   Diagnosis Date Noted  . Syncope  08/05/2014  . Pulmonary hypertension 08/06/2013  . Diastolic CHF 29/51/8841  . Frequent falls 08/02/2013  . COPD exacerbation 03/29/2013  . BPPV (benign paroxysmal positional vertigo) 03/09/2013  . NSAID long-term use 02/15/2013  . Cough 12/29/2012  . Chronic respiratory failure 11/01/2012  . Urinary hesitancy 10/04/2012  . Chest pain 10/04/2012  . Dysuria 09/14/2012  . Constipation 09/14/2012  . Anemia due to acute blood loss 09/04/2012  . Hypoxemia 09/03/2012  . Hx of bacterial pneumonia 08/26/2012  . 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 Hemorrhoid 08/12/2012  . Left shoulder pain 06/27/2012  . Headache 05/09/2012  . Edema 02/04/2012  . POSTMENOPAUSAL STATUS 06/04/2010  . ANXIETY 02/13/2010  . PANIC ATTACK 10/31/2009  . Obstructive sleep apnea 02/19/2009  . SINUSITIS, CHRONIC 08/17/2008  . Vitamin D deficiency 01/21/2008  . OSTEOPENIA 11/24/2007  . Irritable bowel syndrome 11/17/2007  . ALLERGIC RHINITIS 09/24/2007  . Chronic obstructive pulmonary disease (COPD) 05/12/2007  . Hyperlipidemia 10/08/2006  . OBESITY, NOS  BMI 33.8 10/08/2006  . Tobacco abuse 10/08/2006  . Intractable migraine without aura 10/08/2006  . GLAUCOMA 10/08/2006  . GASTROESOPHAGEAL REFLUX, NO ESOPHAGITIS 10/08/2006  . HERNIA, HIATAL, NONCONGENITAL 10/08/2006  . INCONTINENCE, STRESS, FEMALE 10/08/2006  . DJD (degenerative joint disease) of knee 10/08/2006  . Convulsions 10/08/2006   Past Medical History: Past Medical History  Diagnosis Date  . Allergic rhinitis   . Hypertension   . Hyperlipidemia   . GERD (gastroesophageal reflux disease)   . Glaucoma   . Neck pain     Cervical disc  . IBS (irritable bowel syndrome)   . Esophageal stricture   . Diverticulosis   . Right leg DVT 1982    groin  . COPD (chronic obstructive pulmonary disease)   . Emphysema   . Pneumonia 2012; 04/27/2012  . Chronic bronchitis     "keep it all the time" (04/27/2012)  . Exertional dyspnea   . Type II diabetes  mellitus     No meds since weight loss  . Migraine   . Epilepsy   . DJD (degenerative joint disease)   . Arthritis     "hands and legs and feet" (04/27/2012)  . Depression   . OSA on CPAP     6-7 yrs  . UTI (urinary tract infection)     2 weeks ago  . Cancer 05    rt arm mole rem cancer   Past Surgical History: Past Surgical History  Procedure Laterality Date  . Tubal ligation  2001  . Knee arthroscopy  1990's    left  . Carpal tunnel release  ~ 2008    right  . Shoulder open rotator cuff repair  ~ 2010    left  . Abdominal hysterectomy  2001    "partial"  . 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 Total knee arthroplasty  08/23/2012    Procedure: TOTAL KNEE ARTHROPLASTY;  Surgeon: Lorn Junes, MD;  Location: [facility];  Service: Orthopedics;  Laterality: Right;  RIGHT KNEE ARTHROPLASTY MEDIAL AND LATERAL COMPARTMENTS WITH PATELLA RESURFACING   Social History: History  Substance Use Topics  . Smoking status: Current Every Day Smoker -- 0.50 packs/day for 36 years    Types: Cigarettes    Start date: 08/12/1975  . Smokeless tobacco: Former Systems developer    Quit date: 08/23/2012  Comment: started back smoking in 11/2012  . Alcohol Use: No   Additional social history: N/A Please also refer to relevant sections of EMR.  Family History: Family History  Problem Relation Age of Onset  . Heart disease Maternal Grandfather   . Diabetes Maternal Grandfather   . Heart disease      uncle  . Diabetes      uncles/aunts  . Breast cancer Maternal Aunt   . Alcohol abuse Father   . Lung cancer Father     was a smoker  . Emphysema Father     was a smoker  . Bipolar disorder Son   . Muscular dystrophy Son   . Heart disease Sister   . Colon cancer Neg Hx   . Lung cancer Mother     was a smoker  . Stroke Mother   . Other Sister     blood clotting disorder  . Heart disease Son    Allergies and Medications: Allergies  Allergen Reactions  . Amoxicillin Anaphylaxis     Breakout, mouth swells  . Azithromycin Swelling and Rash    "swelling all over, high, thrush"  . Doxycycline Hyclate Anaphylaxis and Rash    swelling  . Penicillins Shortness Of Breath and Rash    "almost died, ended up in hospital"  . Sulfonamide Derivatives Shortness Of Breath, Swelling and Rash    "break out from head to toe"  . Chantix [Varenicline] Other (See Comments)    "bad dreams, difficulty breathing"  . Clarithromycin Hives    swelling  . Nortriptyline Other (See Comments)    Per patient had "kidney problems" on this medication, could not use bathroom (urinary retention), swelling and breakout  . Fluticasone-Salmeterol Other (See Comments)    REACTION: ulcers in mouth   No current facility-administered medications on file prior to encounter.   Current Outpatient Prescriptions on File Prior to Encounter  Medication Sig Dispense Refill  . acetaminophen (TYLENOL) 325 MG tablet Take 2 tablets (650 mg total) by mouth every 6 (six) hours as needed for pain or fever (greater than 101). For pain    . albuterol (PROAIR HFA) 108 (90 BASE) MCG/ACT inhaler Inhale 2 puffs into the lungs every 6 (six) hours as needed. Shortness of breath 1 Inhaler 3  . albuterol (PROVENTIL) (2.5 MG/3ML) 0.083% nebulizer solution Take 3 mLs (2.5 mg total) by nebulization every 6 (six) hours as needed. For shortness of breath 75 mL 3  . aspirin EC 81 MG tablet Take 1 tablet (81 mg total) by mouth daily.    . Cholecalciferol ([employer] VITAMIN D) 2000 UNITS CAPS TAKE ONE CAPSULE BY MOUTH EVERY DAY 30 capsule 11  . Cholecalciferol 50000 UNITS TABS Take 1 tablet by mouth once a week. 8 tablet 0  . ferrous sulfate 325 (65 FE) MG tablet Take 1 tablet (325 mg total) by mouth 2 (two) times daily with a meal. 60 tablet 3  . fluticasone (FLONASE) 50 MCG/ACT nasal spray Place 2 sprays into both nostrils daily as needed. For congestion. 16 g 1  . guaiFENesin (MUCINEX) 600 MG 12 hr tablet Take 1 tablet (600 mg total) by mouth  2 (two) times daily. 60 tablet 0  . lamoTRIgine (LAMICTAL) 150 MG tablet Take 1 tablet (150 mg total) by mouth 2 (two) times daily. 180 tablet 3  . levofloxacin (LEVAQUIN) 500 MG tablet Take 1 tablet (500 mg total) by mouth daily. 5 tablet 0  . Loratadine 10 MG CAPS Take 1 capsule (10  mg total) by mouth daily. 30 each 5  . mometasone-formoterol (DULERA) 200-5 MCG/ACT AERO Inhale 2 puffs into the lungs 2 (two) times daily. 13 g 3  . NITROFURANTOIN PO Take by mouth.    Marland Kitchen omeprazole (PRILOSEC) 40 MG capsule Take 1 capsule (40 mg total) by mouth 2 (two) times daily. 60 capsule 11  . predniSONE (DELTASONE) 10 MG tablet Take 3 pill for 5 days, then 2 pills for 3 days, then 1 pill for 3 days. 24 tablet 0  . sertraline (ZOLOFT) 50 MG tablet Take 1 tablet (50 mg total) by mouth daily. 30 tablet 1  . simvastatin (ZOCOR) 40 MG tablet Take 1 tablet (40 mg total) by mouth at bedtime. 30 tablet 3  . tiotropium (SPIRIVA) 18 MCG inhalation capsule Place 1 capsule (18 mcg total) into inhaler and inhale daily. 30 capsule 11  . topiramate (TOPAMAX) 100 MG tablet 1tab in the am, 2 at bedtime 270 tablet 3    Objective: BP 114/60 mmHg  Pulse 47  Temp(Src) 98 F (36.7 C) (Oral)  Resp 20  Ht 5\' 1"  (1.549 m)  Wt 189 lb 12.8 oz (86.093 kg)  BMI 35.88 kg/m2  SpO2 94% Exam: General: NAD, lying in hospital bed HEENT: EOMI, PERRL, swollen left nasal turbinate, MMM Cardiovascular: RRR, no murmurs appreciated Respiratory: NWOB, CTAB, no wheezes appreciated Abdomen: +BS, s, nt, nd Extremities: no cyanosis, no edema, LLE tender to palpation along calf, no joint effusions or erythema noted.  Skin: No rashes Neuro: Alert and oriented. Sensation to light touch grossly intact bilaterally. Strength 5/5 and equal in bilateral lower extremities.   Labs and Imaging: CBC BMET   Recent Labs Lab 08/05/14 1321  WBC 12.6*  HGB 11.2*  HCT 33.8*  PLT 201    Recent Labs Lab 08/05/14 1321  NA 142  K 3.4*  CL 112   CO2 23  BUN 20  CREATININE 1.01  GLUCOSE 115*  CALCIUM 8.9     EKG: NSR Troponin 0.02  Dg Chest 2 View  08/05/2014   CLINICAL DATA:  Acute cough and congestion  EXAM: CHEST  2 VIEW  COMPARISON:  06/26/2014  FINDINGS: The heart size and mediastinal contours are within normal limits. Both lungs are clear. The visualized skeletal structures are unremarkable.  IMPRESSION: No active cardiopulmonary disease.   Electronically Signed   By: Daryll Brod M.D.   On: 08/05/2014 14:38    Dimas Chyle, MD 08/05/2014, 6:00 PM PGY-1, [facility] Intern pager: [phone], text pages welcome  I have seen and examined Ms. Jasperson with Dr. Jerline Pain and I agree with his documentation above. My annotations are in blue.   Laroy Apple, MD [facility] [place] Resident, PGY-3 08/05/2014, 6:29 PM

## 2014-08-05 NOTE — Progress Notes (Addendum)
Patient trasfered from ED to 5W16 via stretcher; alert and oriented x 4; no complaints of pain; IV saline locked in RFA; skin intact. Orient patient to room and unit; gave patient care guide; instructed how to use the call bell and  fall risk precautions. Will continue to monitor the patient.

## 2014-08-06 ENCOUNTER — Observation Stay (HOSPITAL_COMMUNITY): Payer: Medicaid Other

## 2014-08-06 DIAGNOSIS — M25562 Pain in left knee: Secondary | ICD-10-CM | POA: Insufficient documentation

## 2014-08-06 DIAGNOSIS — I519 Heart disease, unspecified: Secondary | ICD-10-CM

## 2014-08-06 DIAGNOSIS — R55 Syncope and collapse: Secondary | ICD-10-CM | POA: Insufficient documentation

## 2014-08-06 DIAGNOSIS — R569 Unspecified convulsions: Secondary | ICD-10-CM | POA: Insufficient documentation

## 2014-08-06 DIAGNOSIS — R05 Cough: Secondary | ICD-10-CM | POA: Insufficient documentation

## 2014-08-06 LAB — CLOSTRIDIUM DIFFICILE BY PCR: Toxigenic C. Difficile by PCR: NEGATIVE

## 2014-08-06 LAB — BASIC METABOLIC PANEL
Anion gap: 7 (ref 5–15)
BUN: 15 mg/dL (ref 6–23)
CO2: 25 mmol/L (ref 19–32)
CREATININE: 0.89 mg/dL (ref 0.50–1.10)
Calcium: 9.3 mg/dL (ref 8.4–10.5)
Chloride: 107 mEq/L (ref 96–112)
GFR calc non Af Amer: 71 mL/min — ABNORMAL LOW (ref >90)
GFR, EST AFRICAN AMERICAN: 82 mL/min — AB (ref >90)
Glucose, Bld: 101 mg/dL — ABNORMAL HIGH (ref 70–99)
Potassium: 3.1 mmol/L — ABNORMAL LOW (ref 3.5–5.1)
SODIUM: 139 mmol/L (ref 135–145)

## 2014-08-06 LAB — CBC
HCT: 33.9 % — ABNORMAL LOW (ref 36.0–46.0)
Hemoglobin: 10.9 g/dL — ABNORMAL LOW (ref 12.0–15.0)
MCH: 29.9 pg (ref 26.0–34.0)
MCHC: 32.2 g/dL (ref 30.0–36.0)
MCV: 92.9 fL (ref 78.0–100.0)
PLATELETS: 214 10*3/uL (ref 150–400)
RBC: 3.65 MIL/uL — ABNORMAL LOW (ref 3.87–5.11)
RDW: 13.7 % (ref 11.5–15.5)
WBC: 10.9 10*3/uL — AB (ref 4.0–10.5)

## 2014-08-06 MED ORDER — BENZONATATE 100 MG PO CAPS
100.0000 mg | ORAL_CAPSULE | Freq: Two times a day (BID) | ORAL | Status: DC | PRN
  Administered 2014-08-06: 100 mg via ORAL
  Filled 2014-08-06 (×3): qty 1

## 2014-08-06 MED ORDER — KETOROLAC TROMETHAMINE 10 MG PO TABS
10.0000 mg | ORAL_TABLET | Freq: Four times a day (QID) | ORAL | Status: DC | PRN
  Administered 2014-08-06 – 2014-08-07 (×2): 10 mg via ORAL
  Filled 2014-08-06 (×3): qty 1

## 2014-08-06 MED ORDER — POTASSIUM CHLORIDE CRYS ER 20 MEQ PO TBCR
40.0000 meq | EXTENDED_RELEASE_TABLET | Freq: Two times a day (BID) | ORAL | Status: AC
  Administered 2014-08-06 (×2): 40 meq via ORAL
  Filled 2014-08-06 (×2): qty 2

## 2014-08-06 MED ORDER — BENZONATATE 100 MG PO CAPS
200.0000 mg | ORAL_CAPSULE | Freq: Two times a day (BID) | ORAL | Status: DC | PRN
  Filled 2014-08-06: qty 2

## 2014-08-06 MED ORDER — ACETAMINOPHEN-CODEINE #3 300-30 MG PO TABS
1.0000 | ORAL_TABLET | Freq: Four times a day (QID) | ORAL | Status: DC | PRN
  Administered 2014-08-06: 1 via ORAL
  Filled 2014-08-06 (×2): qty 1

## 2014-08-06 MED ORDER — LOPERAMIDE HCL 2 MG PO CAPS
2.0000 mg | ORAL_CAPSULE | ORAL | Status: DC | PRN
  Administered 2014-08-06: 2 mg via ORAL
  Filled 2014-08-06: qty 1

## 2014-08-06 NOTE — Progress Notes (Signed)
  Echocardiogram 2D Echocardiogram has been performed.  [name] 08/06/2014, 11:21 AM

## 2014-08-06 NOTE — Discharge Instructions (Signed)
We are so glad to see that you are feeling better.  You were admitted for atypical syncope (loss of consciousness).  You had a cardiac echo done, which did not reveal any abnormalities that would cause your symptoms.  A lamictal level was also obtained which showed that you were at therapeutic levels of your medication.  It is strongly recommended that you follow up with your neurologist soon for continued monitoring.  In addition, for your Left leg pain, an xray was obtained which showed no breaks or fractures causing your pain.  Your pain is likely due to soft tissue injury.  You can treat this at home with Tylenol, elevation of your leg, rest and ice.  Please make sure to follow up with [facility].  An appointment has already been made for you for 1/6 @ 9:45 am.   Knee Pain Knee pain can be a result of an injury or other medical conditions. Treatment will depend on the cause of your pain. HOME CARE  Only take medicine as told by your doctor.  Keep a healthy weight. Being overweight can make the knee hurt more.  Stretch before exercising or playing sports.  If there is constant knee pain, change the way you exercise. Ask your doctor for advice.  Make sure shoes fit well. Choose the right shoe for the sport or activity.  Protect your knees. Wear kneepads if needed.  Elevate your leg when resting.  You may also use ice or heat pads   Rest when you are tired. GET HELP RIGHT AWAY IF:   Your knee pain does not stop.  Your knee pain does not get better.  Your knee joint feels hot to the touch.  You have a fever. MAKE SURE YOU:   Understand these instructions.  Will watch this condition.  Will get help right away if you are not doing well or get worse. Document Released: 10/24/2008 Document Revised: 10/20/2011 Document Reviewed: 10/24/2008 [facility] Patient Information 2015 [place], [place]. This information is not intended to replace advice given to you by your health care  provider. Make sure you discuss any questions you have with your health care provider.

## 2014-08-06 NOTE — Progress Notes (Signed)
Family Medicine Teaching Service Daily Progress Note Intern Pager: [phone]  Patient name: [name] Medical record number: [id number] Date of birth: May 13, 1956 Age: 58 y.o. Gender: female  Primary Care Provider: Chrisandra Netters, MD Consultants: None Code Status: FULL  Pt Overview and Major Events to Date:   Assessment and Plan: [name] is a 58 y.o. female presenting with syncope . PMH is significant for epilepsy, COPD, HTN, HLD, T2DM, depression, anxiety, OSA  # Syncope. Unclear etiology. Possibly recurrent seizures given her known history of epilepsy and possible post-ictal effects. Can consider vasovagal syncope as the episodes are associated with coughing fits, however patient has no preceding symptoms or warning. Can also consider cardiac etiology given sudden nature of the events, however it would be unusual for such events to last 5-10 minutes as reported. Does not appear to be orthostasis as the symptoms occur regardless of position and orthostatic vitals here were within normal range.  - f/u 2d echo, ordered but not performed yet. - Can consider consulting neurology if symptoms become more consistent with breakthrough seizures or etiology remains unclear  # Epilepsy. No reported seizure for several months. No seizure activity in hospital. - Continue lamictal 150mg  bid - Continue topiramate 100mg  qAM and 200mg  qHS - Lamictal level pending  # COPD/Chronic cough/Tobacco abuse. No wheezes to suggest an acute COPD exacerbation. No fevers or chills. Chronic cough likely due to chronic bronchitis, with post nasal drip possibly contributing. Patient continues to smoke 1 pack per day. S/p prednisone 60 mg X 1 in ED - Continue home albuterol, spiriva, and dulera - Continue home flonase and loratadine - Supplemental oxygen as needed to keep sats >88% - Offer nicotine patch prn - Consider treatment for acute exacerbation if patient develops significant dyspnea or wheezes. -  Tessalon perle ordered - Continued smoking cessation counseling provided today.  Patient acknowledges need to quit with recent decrease from 1ppd.  She declines nicotine patch. She does not seem ready to quit yet.  Pre-contemplative stage.  # LLE pain: Patient reports falling on L knee last week.  She endorses continued pain in LLE/knee. Likely chronic meniscal injury but will evaluate for possible fracture/bony abnormalities. -A/P, Lateral, sunrise, standing Xray of knee  # HLD. Continue home statin  # T2DM. Diet controlled. - A1C 5.7 on 06/19/2014  # Depression/Anxiety. Continue home zoloft.  FEN/GI: Heart healthy diet, SLIV Prophylaxis: SubQ heparin  Disposition: Discharge pending syncope workup.  Subjective:  Patient reports that she has not had any recurrence of syncope here in hospital.  She does report to me that when she has syncope she also has loss of bladder control and sometimes bowel control.  She reports compliance with her seizure medications.  Denies headache, vision changes, palpitations.  She admits to chronic dizziness, especially when laying on her L side.  She also endorses aching pain in her L knee/ leg that has been present for since last week.    Objective: Temp:  [97.8 F (36.6 C)-98.6 F (37 C)] 97.8 F (36.6 C) (12/27 0649) Pulse Rate:  [47-92] 72 (12/27 0649) Resp:  [13-28] 13 (12/27 0649) BP: (90-116)/(43-76) 110/70 mmHg (12/27 0649) SpO2:  [81 %-97 %] 93 % (12/27 0750) Weight:  [189 lb 12.8 oz (86.093 kg)] 189 lb 12.8 oz (86.093 kg) (12/26 1727) Physical Exam: General: awake, alert, well appearing, sitting up in bed on her phone, NAD Cardiovascular: S1S2, RRR, no m/r/g Respiratory: poor air movement globally, no wheeze, no increased WOB Abdomen: obese, soft,  NT/ND, +BS Extremities: WWP, no effusion along knee joint line, calves are symmetrical, no edema or erythema, mild TTP along calf, no palpable cords MSK: 5/5 strength in LE, patient uses RW to  ambulate at home. Neuro: no focal deficits  Laboratory:  Recent Labs Lab 08/05/14 1321 08/06/14 0540  WBC 12.6* 10.9*  HGB 11.2* 10.9*  HCT 33.8* 33.9*  PLT 201 214    Recent Labs Lab 08/05/14 1321 08/06/14 0540  NA 142 139  K 3.4* 3.1*  CL 112 107  CO2 23 25  BUN 20 15  CREATININE 1.01 0.89  CALCIUM 8.9 9.3  GLUCOSE 115* 101*   Lamictal level: pending  Imaging/Diagnostic Tests: Dg Chest 2 View  08/05/2014   CLINICAL DATA:  Acute cough and congestion  EXAM: CHEST  2 VIEW  COMPARISON:  06/26/2014  FINDINGS: The heart size and mediastinal contours are within normal limits. Both lungs are clear. The visualized skeletal structures are unremarkable.  IMPRESSION: No active cardiopulmonary disease.   Electronically Signed   By: Daryll Brod M.D.   On: 08/05/2014 14:38   Janora Norlander, DO 08/06/2014, 8:36 AM PGY-1, [place] Intern pager: [phone], text pages welcome

## 2014-08-06 NOTE — Progress Notes (Signed)
Patient asking for medication for her diarrhea. CDIFF protocol initiated, resulted negative. MD notified of results. MD stated she will place medication orders for patient.

## 2014-08-06 NOTE — Discharge Summary (Signed)
[facility] Discharge Summary  Patient name: [name] record number: [id number] Date of birth: 1956-05-27 Age: 58 y.o. Gender: female Date of Admission: 08/05/2014  Date of Discharge: 08/07/14 Admitting Physician: Lind Covert, MD  Primary Care Provider: Chrisandra Netters, MD Consultants: none  Indication for Hospitalization: syncope  Discharge Diagnoses/Problem List:  Syncope, post tussive Knee pain Seizure disorder Tobacco abuse  Disposition: Discharge home  Discharge Condition: Stable  Discharge Exam:  BP 117/55 mmHg  Pulse 66  Temp(Src) 97.9 F (36.6 C) (Oral)  Resp 18  Ht 5\' 1"  (1.549 m)  Wt 189 lb 12.8 oz (86.093 kg)  BMI 35.88 kg/m2  SpO2 94% Gen: awake, alert, well appearing female, NAD HEENT: [place]/AT, EOMI, MMM Cardio: RRR, no m/r/g, brisk cap refill Pulm: poor air movement globally, no increased WOB Abd: Soft, NT/ND, +BS Ext: mild effusion around L knee, no joint line TTP, +mild TTP on L calf, no palpable cord, LE symmetrical with no erythema. MSK: uses RW to ambulate Neuro: no focal deficits, follows commands  Brief Hospital Course:  [name] is a 58 y.o. female that presented with syncope . PMH is significant for epilepsy, COPD, HTN, HLD, T2DM, depression, anxiety, OSA.  In the ED, patient given duonebs and 1 dose of prednisone. Work up was remarkable for mild hypokalemia (3.4), leukocytosis (12.6) and negative CXR.  Patient was admitted to [facility] for observation & for further workup.  EKG was unrevealing.  A 2D echocardiogram was obtained which revealed grade 1 diastolic dysfunction and a septal aneurysm but no obvious cause of patient's syncope.  She of note, was monitored on telemetry and had no recurrent syncopal events while hospitalized, even in the setting of continued cough.  She admitted to continued smoking outpatient.  She appeared to be in the pre-contemplative stage of quitting.  However, discussions  were held at length regarding necessity of smoking cessation in order to stop coughing.  Her syncope appeared to be of vasovagal etiology.  She did complain of LLE pain, with a reported fall on L knee 1 week prior.  While there was no obvious bony deformities, erythema or edema on exam, she did exhibit TTP along joint line.  Knee xrays were unremarkable for any acute fracture/deformity.  Patient was provided Tylenol #3 for pain, this was also used for her cough.  She was continued on her home medications for her seizure disorder, COPD, anxiety and HLD.  In addition, her K was repleated during hospitalization.  Issues for Follow Up:   LE pain  Cough/Smoking cessation  Recommend close follow up with neuro  Consider rechecking K  Significant Procedures: Echo  Significant Labs and Imaging:   Recent Labs Lab 08/05/14 1321 08/06/14 0540 08/07/14 0726  WBC 12.6* 10.9* 10.6*  HGB 11.2* 10.9* 11.0*  HCT 33.8* 33.9* 34.4*  PLT 201 214 220    Recent Labs Lab 08/05/14 1321 08/06/14 0540 08/07/14 0726  NA 142 139 140  K 3.4* 3.1* 3.9  CL 112 107 110  CO2 23 25 26   GLUCOSE 115* 101* 92  BUN 20 15 16   CREATININE 1.01 0.89 0.95  CALCIUM 8.9 9.3 8.9   Dg Chest 2 View  08/05/2014   CLINICAL DATA:  Acute cough and congestion  EXAM: CHEST  2 VIEW  COMPARISON:  06/26/2014  FINDINGS: The heart size and mediastinal contours are within normal limits. Both lungs are clear. The visualized skeletal structures are unremarkable.  IMPRESSION: No active cardiopulmonary disease.  Electronically Signed   By: Daryll Brod M.D.   On: 08/05/2014 14:38   Dg Knee Ap/lat W/sunrise Left  08/06/2014   CLINICAL DATA:  Acute left knee pain after fall on 08/02/2014.  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 These changes are most pronounced in the medial compartment where there is significant joint space narrowing, sclerosis  and osteophyte formation. No acute fracture, malalignment or significant effusion. Mild soft tissue swelling diffusely.  IMPRESSION: Tricompartmental osteoarthritis.  Diffuse soft tissue swelling.  No acute osseous finding or fracture.   Electronically Signed   By: Daryll Brod M.D.   On: 08/06/2014 17:58   2D Echo: Study Conclusions - Left ventricle: The cavity size was normal. Wall thickness was normal. Systolic function was normal. The estimated ejection fraction was in the range of 55% to 60%. Wall motion was normal; there were no regional wall motion abnormalities. Doppler parameters are consistent with abnormal left ventricular relaxation (grade 1 diastolic dysfunction). - Atrial septum: There was an atrial septal aneurysm.  Impressions: - Normal LV function; grade 1 diastolic dysfunction; trace MR and TR.  Results/Tests Pending at Time of Discharge: Lamictal level  Discharge Medications:    Medication List    STOP taking these medications        acetaminophen 325 MG tablet  Commonly known as:  TYLENOL     predniSONE 10 MG tablet  Commonly known as:  DELTASONE      TAKE these medications        acetaminophen-codeine 300-30 MG per tablet  Commonly known as:  TYLENOL #3  Take 1 tablet by mouth every 6 (six) hours as needed for moderate pain or severe pain.     albuterol (2.5 MG/3ML) 0.083% nebulizer solution  Commonly known as:  PROVENTIL  Take 3 mLs (2.5 mg total) by nebulization every 6 (six) hours as needed. For shortness of breath     albuterol 108 (90 BASE) MCG/ACT inhaler  Commonly known as:  PROAIR HFA  Inhale 2 puffs into the lungs every 6 (six) hours as needed. Shortness of breath     aspirin EC 81 MG tablet  Take 1 tablet (81 mg total) by mouth daily.     Cholecalciferol 50000 UNITS Tabs  Take 1 tablet by mouth once a week.     ferrous sulfate 325 (65 FE) MG tablet  Take 1 tablet (325 mg total) by mouth 2 (two) times daily with a meal.      fluticasone 50 MCG/ACT nasal spray  Commonly known as:  FLONASE  Place 2 sprays into both nostrils daily as needed. For congestion.     guaiFENesin 600 MG 12 hr tablet  Commonly known as:  MUCINEX  Take 1 tablet (600 mg total) by mouth 2 (two) times daily.     ibuprofen 200 MG tablet  Commonly known as:  ADVIL,MOTRIN  Take 200 mg by mouth every 6 (six) hours as needed (migraine).     lamoTRIgine 150 MG tablet  Commonly known as:  LAMICTAL  Take 1 tablet (150 mg total) by mouth 2 (two) times daily.     Loratadine 10 MG Caps  Take 1 capsule (10 mg total) by mouth daily.     mometasone-formoterol 200-5 MCG/ACT Aero  Commonly known as:  DULERA  Inhale 2 puffs into the lungs 2 (two) times daily.     omeprazole 40 MG capsule  Commonly known as:  PRILOSEC  Take 1 capsule (40 mg total) by mouth 2 (two) times daily.     QUEtiapine 50 MG tablet  Commonly known as:  SEROQUEL  Take 50 mg by mouth at bedtime.     sertraline 50 MG tablet  Commonly known as:  ZOLOFT  Take 1 tablet (50 mg total) by mouth daily.     simvastatin 40 MG tablet  Commonly known as:  ZOCOR  Take 1 tablet (40 mg total) by mouth at bedtime.     tiotropium 18 MCG inhalation capsule  Commonly known as:  SPIRIVA  Place 1 capsule (18 mcg total) into inhaler and inhale daily.     topiramate 100 MG tablet  Commonly known as:  TOPAMAX  1tab in the am, 2 at bedtime        Discharge Instructions: Please refer to Patient Instructions section of EMR for full details.  Patient was counseled important signs and symptoms that should prompt return to medical care, changes in medications, dietary instructions, activity restrictions, and follow up appointments.   Follow-Up Appointments: Follow-up Information    Follow up with Kenn File, MD On 08/16/2014.   Specialty:  Family Medicine   Why:  9:45 am (hospital f/u)   Contact information:   [place] [address] [phone]        Janora Norlander, DO 08/07/2014, 1:21 PM PGY-1, [place]

## 2014-08-06 NOTE — Progress Notes (Signed)
UR completed 

## 2014-08-07 DIAGNOSIS — J441 Chronic obstructive pulmonary disease with (acute) exacerbation: Secondary | ICD-10-CM

## 2014-08-07 DIAGNOSIS — Z720 Tobacco use: Secondary | ICD-10-CM

## 2014-08-07 LAB — CBC
HEMATOCRIT: 34.4 % — AB (ref 36.0–46.0)
HEMOGLOBIN: 11 g/dL — AB (ref 12.0–15.0)
MCH: 30.6 pg (ref 26.0–34.0)
MCHC: 32 g/dL (ref 30.0–36.0)
MCV: 95.6 fL (ref 78.0–100.0)
Platelets: 220 10*3/uL (ref 150–400)
RBC: 3.6 MIL/uL — ABNORMAL LOW (ref 3.87–5.11)
RDW: 14.1 % (ref 11.5–15.5)
WBC: 10.6 10*3/uL — AB (ref 4.0–10.5)

## 2014-08-07 LAB — BASIC METABOLIC PANEL
Anion gap: 4 — ABNORMAL LOW (ref 5–15)
BUN: 16 mg/dL (ref 6–23)
CO2: 26 mmol/L (ref 19–32)
Calcium: 8.9 mg/dL (ref 8.4–10.5)
Chloride: 110 mEq/L (ref 96–112)
Creatinine, Ser: 0.95 mg/dL (ref 0.50–1.10)
GFR calc Af Amer: 76 mL/min — ABNORMAL LOW (ref >90)
GFR calc non Af Amer: 66 mL/min — ABNORMAL LOW (ref >90)
GLUCOSE: 92 mg/dL (ref 70–99)
Potassium: 3.9 mmol/L (ref 3.5–5.1)
SODIUM: 140 mmol/L (ref 135–145)

## 2014-08-07 MED ORDER — ACETAMINOPHEN-CODEINE #3 300-30 MG PO TABS: 1.0000 | ORAL_TABLET | Freq: Four times a day (QID) | ORAL | Status: DC | PRN

## 2014-08-07 NOTE — Plan of Care (Signed)
Problem: Phase III Progression Outcomes Goal: Foley discontinued Outcome: Not Applicable Date Met:  47/65/46 Pt voids

## 2014-08-07 NOTE — Progress Notes (Signed)
UR completed 

## 2014-08-09 LAB — LAMOTRIGINE LEVEL: Lamotrigine Lvl: 5 ug/mL (ref 4.0–18.0)

## 2014-08-15 ENCOUNTER — Other Ambulatory Visit: Payer: Self-pay | Admitting: *Deleted

## 2014-08-15 DIAGNOSIS — F410 Panic disorder [episodic paroxysmal anxiety] without agoraphobia: Secondary | ICD-10-CM

## 2014-08-15 DIAGNOSIS — J441 Chronic obstructive pulmonary disease with (acute) exacerbation: Secondary | ICD-10-CM

## 2014-08-15 MED ORDER — SIMVASTATIN 40 MG PO TABS: 40.0000 mg | ORAL_TABLET | Freq: Every day | ORAL | Status: DC

## 2014-08-15 MED ORDER — ALBUTEROL SULFATE HFA 108 (90 BASE) MCG/ACT IN AERS: 2.0000 | INHALATION_SPRAY | Freq: Four times a day (QID) | RESPIRATORY_TRACT | Status: DC | PRN

## 2014-08-15 MED ORDER — FLUTICASONE PROPIONATE 50 MCG/ACT NA SUSP: 2.0000 | Freq: Every day | NASAL | Status: DC | PRN

## 2014-08-15 MED ORDER — SERTRALINE HCL 50 MG PO TABS: 50.0000 mg | ORAL_TABLET | Freq: Every day | ORAL | Status: DC

## 2014-08-16 ENCOUNTER — Telehealth: Payer: Self-pay | Admitting: Family Medicine

## 2014-08-16 ENCOUNTER — Ambulatory Visit (INDEPENDENT_AMBULATORY_CARE_PROVIDER_SITE_OTHER): Payer: Medicaid Other | Admitting: Family Medicine

## 2014-08-16 ENCOUNTER — Encounter: Payer: Self-pay | Admitting: Family Medicine

## 2014-08-16 VITALS — BP 135/59 | HR 81 | Temp 98.0°F | Ht 61.0 in | Wt 185.1 lb

## 2014-08-16 DIAGNOSIS — R51 Headache: Principal | ICD-10-CM

## 2014-08-16 DIAGNOSIS — R519 Headache, unspecified: Secondary | ICD-10-CM

## 2014-08-16 DIAGNOSIS — G8929 Other chronic pain: Secondary | ICD-10-CM

## 2014-08-16 DIAGNOSIS — R55 Syncope and collapse: Secondary | ICD-10-CM

## 2014-08-16 DIAGNOSIS — R569 Unspecified convulsions: Secondary | ICD-10-CM

## 2014-08-16 DIAGNOSIS — M25562 Pain in left knee: Secondary | ICD-10-CM

## 2014-08-16 DIAGNOSIS — J441 Chronic obstructive pulmonary disease with (acute) exacerbation: Secondary | ICD-10-CM

## 2014-08-16 DIAGNOSIS — R05 Cough: Secondary | ICD-10-CM

## 2014-08-16 MED ORDER — PREDNISONE 50 MG PO TABS: 50.0000 mg | ORAL_TABLET | Freq: Every day | ORAL | Status: DC

## 2014-08-16 MED ORDER — KETOROLAC TROMETHAMINE 30 MG/ML IJ SOLN
30.0000 mg | Freq: Once | INTRAMUSCULAR | Status: AC
  Administered 2014-08-16: 30 mg via INTRAMUSCULAR

## 2014-08-16 NOTE — Assessment & Plan Note (Signed)
1 additional day since her hospitalization Encouraged her to call her neurologist for appointment ASAP Symptoms mixed with association with cough and vertiginous symptoms with lying on the right consistent with BPPV

## 2014-08-16 NOTE — Patient Instructions (Addendum)
Great to see you again   Please call your neurologist for an appointment as soon as they can work you in due to dizziness and headache You should be hearing from [place] for orthopedic surgery Please come back if you develop shortness of breath or fevers.   PLease come back to see your PCP in 1 month

## 2014-08-16 NOTE — Assessment & Plan Note (Signed)
Her typical headache, exacerbated today Toradol given in clinic Encouraged her to follow-up with neurology sooner than she planned given her dizziness as well.

## 2014-08-16 NOTE — Assessment & Plan Note (Signed)
Treatment for COPD exacerbation, No antibiotics due to no symptoms of infection and limited antibiotic choices given her allergies Frequent exacerbations 50 mg prednisone 5 days, possibly contribute into her syncope

## 2014-08-16 NOTE — Progress Notes (Signed)
Patient ID: [name], female   DOB: 02/09/1956, 59 y.o.   MRN: [id number]   HPI  Patient presents today for  Hospital follow-up  Patient was admitted for syncopal episodes. She was evaluated with echocardiogram and telemetry in the hospital without any cardiac etiology. She had no cecal episodes during hospitalization.   Since getting home she states she's had one episode 2 days ago. This was witnessed by her sons. She did not have any shaking consistent with her previous seizures she did have urinary incontinence during the episode. She states that she has feelings of dizziness when she first stands up as well as turns her head to the right related down the right side.   Today she states that she has a headache typical of her regular headaches.  she's having neck pain typical of her usual arthritis.  Knee pain  she does complain of 10 out of 10 left knee pain. She states at baseline she has end-stage arthritis so severe that she's been offered a knee replacement by an orthopedic surgeon. She does not desire surgery at this time. She states the pain is worse after a fall previous to her hospitalization.  While hospitalized she had an x-ray which ruled out fractures.  She states she has had blood clots before , I offered  Venous ultrasound to evaluate but she declines that today. A  she had some improvement in knee pain previously with a cortisone injection, she declines that today She does want a shot of Toradol which helps her headache.  She states that Tylenol 3 made her shake so she did not take it, did not help the pain either.  she does endorse locking and popping symptoms  Smoking status noted ROS: Per HPI  Objective: BP 135/59 mmHg  Pulse 81  Temp(Src) 98 F (36.7 C) (Oral)  Ht 5\' 1"  (1.549 m)  Wt 185 lb 1.6 oz (83.961 kg)  BMI 34.99 kg/m2 Gen: NAD, alert, cooperative with exam HEENT: NCAT CV: RRR, good S1/S2, no murmur Resp: CTABL, no wheezes, non-labored , frequent  cough Neuro: Alert and oriented, No gross deficits , walks with a walker  ext: no significant edema, left calf measures 38 cm, right calf measures 35 cm in circumference MSK: L knee without erythema, effusion, bruising, or gross deformity  medial joint line tenderness ligamentously intact to Lachman's and with varus and valgus stress.    Assessment and plan:  Headache Her typical headache, exacerbated today Toradol given in clinic Encouraged her to follow-up with neurology sooner than she planned given her dizziness as well.  Knee pain, acute Left knee pain continued after a traumatic fall a few weeks ago Some findings consistent with meniscal tear States she's been offered knee replacement by orthopedic surgery previously due to end-stage arthritis Will refer back to orthopedic surgery Codeine not helping pain, I don't believe that narcotics are a good long-term option for her Tramadol doesn't seem appropriate given her Zoloft, Lamictal, and Topamax Toradol given today, offered steroid injection which she declines  Syncope, tussive 1 additional day since her hospitalization Encouraged her to call her neurologist for appointment ASAP Symptoms mixed with association with cough and vertiginous symptoms with lying on the right consistent with BPPV  COPD exacerbation Treatment for COPD exacerbation, No antibiotics due to no symptoms of infection and limited antibiotic choices given her allergies Frequent exacerbations 50 mg prednisone 5 days, possibly contribute into her syncope   Orders Placed This Encounter  Procedures  . Ambulatory  referral to Orthopedic Surgery    Referral Priority:  Routine    Referral Type:  Surgical    Referral Reason:  Specialty Services Required    Requested Specialty:  Orthopedic Surgery    Number of Visits Requested:  1    Meds ordered this encounter  Medications  . predniSONE (DELTASONE) 50 MG tablet    Sig: Take 1 tablet (50 mg total) by  mouth daily with breakfast.    Dispense:  5 tablet    Refill:  0

## 2014-08-16 NOTE — Telephone Encounter (Signed)
Pt called because she was told by Dr. Wendi Snipes to make an appointment with Dr. [name]. When she called to make the appointment was told that Dr. Wendi Snipes will have to do a referral for her to be seen. jw

## 2014-08-16 NOTE — Assessment & Plan Note (Signed)
Left knee pain continued after a traumatic fall a few weeks ago Some findings consistent with meniscal tear States she's been offered knee replacement by orthopedic surgery previously due to end-stage arthritis Will refer back to orthopedic surgery Codeine not helping pain, I don't believe that narcotics are a good long-term option for her Tramadol doesn't seem appropriate given her Zoloft, Lamictal, and Topamax Toradol given today, offered steroid injection which she declines

## 2014-08-17 NOTE — Telephone Encounter (Signed)
I was under th eimpression she was an established patient with neurology due to severe migraines and seizure d/o.   I wrote a new referral, will ask team to follow up.   Laroy Apple, MD [place] Resident, PGY-3 08/17/2014, 12:17 PM

## 2014-08-17 NOTE — Telephone Encounter (Signed)
Referral placed in [facility] workqueue, they will contact patient with appointment.

## 2014-08-21 ENCOUNTER — Other Ambulatory Visit: Payer: Self-pay | Admitting: *Deleted

## 2014-08-21 DIAGNOSIS — F410 Panic disorder [episodic paroxysmal anxiety] without agoraphobia: Secondary | ICD-10-CM

## 2014-08-22 MED ORDER — SERTRALINE HCL 50 MG PO TABS: 50.0000 mg | ORAL_TABLET | Freq: Every day | ORAL | Status: DC

## 2014-09-01 ENCOUNTER — Institutional Professional Consult (permissible substitution): Payer: Medicaid Other | Admitting: Pulmonary Disease

## 2014-09-04 ENCOUNTER — Institutional Professional Consult (permissible substitution): Payer: Medicaid Other | Admitting: Neurology

## 2014-09-14 ENCOUNTER — Other Ambulatory Visit: Payer: Self-pay | Admitting: *Deleted

## 2014-09-15 ENCOUNTER — Other Ambulatory Visit: Payer: Self-pay | Admitting: Family Medicine

## 2014-09-15 DIAGNOSIS — E559 Vitamin D deficiency, unspecified: Secondary | ICD-10-CM

## 2014-09-15 NOTE — Telephone Encounter (Signed)
Covering for Dr Ardelia Mems. Red Team, please let pt know I am refusing vitamin D once weekly refill. After her 8 week course of this that she has presumably finished, she needs to come in for vitamin D level recheck so we know what dose to prescribe based on her level. Please have her schedule lab only visit for this (or f/u with PCP if other issues to discuss). I will place future order for vitamin D level.  Hilton Sinclair, MD

## 2014-09-18 NOTE — Telephone Encounter (Signed)
Patient informed, will schedule lab appointment.

## 2014-09-19 ENCOUNTER — Other Ambulatory Visit: Payer: Self-pay | Admitting: *Deleted

## 2014-09-22 ENCOUNTER — Other Ambulatory Visit: Payer: Self-pay | Admitting: Family Medicine

## 2014-09-22 MED ORDER — ALBUTEROL SULFATE (2.5 MG/3ML) 0.083% IN NEBU: 2.5000 mg | INHALATION_SOLUTION | Freq: Four times a day (QID) | RESPIRATORY_TRACT | Status: DC | PRN

## 2014-09-22 NOTE — Telephone Encounter (Signed)
Patient requesting a RX for nebulizer to be sent to [facility].

## 2014-09-27 LAB — HM DIABETES EYE EXAM

## 2014-09-29 ENCOUNTER — Telehealth: Payer: Self-pay | Admitting: Neurology

## 2014-09-29 MED ORDER — LAMOTRIGINE 150 MG PO TABS: ORAL_TABLET | ORAL | Status: DC

## 2014-09-29 NOTE — Telephone Encounter (Signed)
[name] from [facility] is calling requesting a refill on lamoTRIgine (LAMICTAL) 150 MG tablet to be sent.  Fax is [phone]. Please advise.

## 2014-09-29 NOTE — Telephone Encounter (Signed)
Rx has been sent  

## 2014-10-03 ENCOUNTER — Institutional Professional Consult (permissible substitution): Payer: Medicaid Other | Admitting: Pulmonary Disease

## 2014-10-06 ENCOUNTER — Encounter: Payer: Self-pay | Admitting: Family Medicine

## 2014-10-06 ENCOUNTER — Ambulatory Visit (INDEPENDENT_AMBULATORY_CARE_PROVIDER_SITE_OTHER): Payer: Medicaid Other | Admitting: Family Medicine

## 2014-10-06 VITALS — BP 109/70 | HR 106 | Temp 98.1°F | Ht 61.0 in | Wt 184.0 lb

## 2014-10-06 DIAGNOSIS — E559 Vitamin D deficiency, unspecified: Secondary | ICD-10-CM

## 2014-10-06 DIAGNOSIS — Z720 Tobacco use: Secondary | ICD-10-CM

## 2014-10-06 DIAGNOSIS — M1712 Unilateral primary osteoarthritis, left knee: Secondary | ICD-10-CM

## 2014-10-06 MED ORDER — HYDROCODONE-ACETAMINOPHEN 5-325 MG PO TABS: 1.0000 | ORAL_TABLET | Freq: Two times a day (BID) | ORAL | Status: DC | PRN

## 2014-10-06 NOTE — Patient Instructions (Signed)
I am sorry you're in so much pain. I'm giving you a prescription for hydrocodone. Follow-up with me in 3-4 weeks so that we can see how you're doing. We are checking your vitamin D level today.  Be well, Dr. Ardelia Mems

## 2014-10-07 LAB — VITAMIN D 25 HYDROXY (VIT D DEFICIENCY, FRACTURES): Vit D, 25-Hydroxy: 31 ng/mL (ref 30–100)

## 2014-10-07 NOTE — Assessment & Plan Note (Addendum)
Recent gradual worsening of L knee pain from known tricompartmental osteoarthritis. Not a surgical candidate presently. Pain is significantly hindering her function. Pt is not a good candidate for tramadol due to her hx of seizure disorder.  Discussed with pt that we always try to avoid long-term narcotic therapy if possible, but that nonoperative end stage knee OA may be an indication. I will rx norco 5-325mg  1 tab BID prn pain with #30 pills, and have her return in 3-4 weeks to reassess.

## 2014-10-07 NOTE — Assessment & Plan Note (Signed)
Continued to encourage smoking cessation. 

## 2014-10-07 NOTE — Progress Notes (Signed)
Patient ID: [name], female   DOB: 04/29/1956, 59 y.o.   MRN: [id number]  HPI:  Pt presents for a same day appointment to discuss L knee pain.  Has hx of R knee replacement. Recently saw Dr. Noemi Chapel for L knee osteoarthritis, was told she has bone on bone but is unfortunately not an operative candidate due to her COPD. She continues to smoke 1/2 ppd and is trying to cut back. She recently had a cortisone shot done in her knee, which didn't provide pain relief for very long. The pain in her knee is now significantly hindering her ability to comfortably do anything involving standing, it is so severe. She is tearful as she explains her fears of ending up in a wheelchair due to the pain and not being able to bear weight. This has been a gradual worsening of her knee pain, not sudden. Has some mild swelling but no redness that she's noticed. Has hx of allergy to codeine but has tolerated hydrocodone in the past.  ROS: See HPI  [place]: hx HLD, vit D def, tobacco abuse, COPD, GERD, diastolic CHF  PHYSICAL EXAM: BP 109/70 mmHg  Pulse 106  Temp(Src) 98.1 F (36.7 C) (Oral)  Ht 5\' 1"  (1.549 m)  Wt 184 lb (83.462 kg)  BMI 34.78 kg/m2 Gen: NAD HEENT: NCAT Abdomen: soft NTTP Neuro: ambulates with rolling walker Ext: L knee with moderate swelling compared to R. R knee with midline scar from prior knee replacement. L knee without erythema or warmth. Crepitus palpable with flexion & extension of knee.   ASSESSMENT/PLAN:  DJD (degenerative joint disease) of knee Recent gradual worsening of L knee pain from known tricompartmental osteoarthritis. Not a surgical candidate presently. Pain is significantly hindering her function. Pt is not a good candidate for tramadol due to her hx of seizure disorder.  Discussed with pt that we always try to avoid long-term narcotic therapy if possible, but that nonoperative end stage knee OA may be an indication. I will rx norco 5-325mg  1 tab BID prn pain with #30 pills,  and have her return in 3-4 weeks to reassess.    Tobacco abuse Continued to encourage smoking cessation.   Vitamin D deficiency Recheck vit D level today.      FOLLOW UP: F/u in 3-4 weeks for L knee pain  [place] J. Ardelia Mems, [place]

## 2014-10-07 NOTE — Assessment & Plan Note (Signed)
- 

## 2014-10-09 ENCOUNTER — Institutional Professional Consult (permissible substitution): Payer: Medicaid Other | Admitting: Neurology

## 2014-10-09 ENCOUNTER — Telehealth: Payer: Self-pay | Admitting: Neurology

## 2014-10-09 NOTE — Telephone Encounter (Signed)
This patient did not show for the appointment, called in just before, indicating she was sick.

## 2014-10-11 ENCOUNTER — Encounter: Payer: Self-pay | Admitting: Neurology

## 2014-10-12 ENCOUNTER — Telehealth: Payer: Self-pay | Admitting: Family Medicine

## 2014-10-12 NOTE — Telephone Encounter (Signed)
Requesting vitamin d test results

## 2014-10-16 MED ORDER — CALCIUM CITRATE 950 (200 CA) MG PO TABS: 200.0000 mg | ORAL_TABLET | Freq: Two times a day (BID) | ORAL | Status: DC

## 2014-10-16 MED ORDER — CHOLECALCIFEROL 10 MCG (400 UNIT) PO TABS: 800.0000 [IU] | ORAL_TABLET | Freq: Every day | ORAL | Status: DC

## 2014-10-16 NOTE — Telephone Encounter (Signed)
Returned call to patient and explained vitamin D results. Vitamin D level is now above 30, so we will switch to daily supplementation with cholecalciferol and calcium citrate. Patient appreciated the call.  Leeanne Rio, MD

## 2014-10-17 ENCOUNTER — Other Ambulatory Visit: Payer: Self-pay | Admitting: *Deleted

## 2014-10-18 MED ORDER — FLUTICASONE PROPIONATE 50 MCG/ACT NA SUSP: 2.0000 | Freq: Every day | NASAL | Status: DC | PRN

## 2014-10-18 MED ORDER — MOMETASONE FURO-FORMOTEROL FUM 200-5 MCG/ACT IN AERO: 2.0000 | INHALATION_SPRAY | Freq: Two times a day (BID) | RESPIRATORY_TRACT | Status: DC

## 2014-10-24 ENCOUNTER — Ambulatory Visit (INDEPENDENT_AMBULATORY_CARE_PROVIDER_SITE_OTHER): Payer: Medicaid Other | Admitting: Neurology

## 2014-10-24 ENCOUNTER — Encounter: Payer: Self-pay | Admitting: Neurology

## 2014-10-24 VITALS — BP 130/65 | HR 77 | Ht 61.0 in | Wt 185.8 lb

## 2014-10-24 DIAGNOSIS — R569 Unspecified convulsions: Secondary | ICD-10-CM

## 2014-10-24 DIAGNOSIS — R42 Dizziness and giddiness: Secondary | ICD-10-CM | POA: Diagnosis not present

## 2014-10-24 DIAGNOSIS — R55 Syncope and collapse: Secondary | ICD-10-CM

## 2014-10-24 DIAGNOSIS — R05 Cough: Secondary | ICD-10-CM | POA: Diagnosis not present

## 2014-10-24 HISTORY — DX: Dizziness and giddiness: R42

## 2014-10-24 MED ORDER — LAMOTRIGINE 200 MG PO TABS: 200.0000 mg | ORAL_TABLET | Freq: Two times a day (BID) | ORAL | Status: DC

## 2014-10-24 NOTE — Patient Instructions (Addendum)
With the Lamictal, we will go 150 mg in the morning, and 200 mg in the evening for 2 weeks, then take 200 mg twice daily.  Epilepsy Epilepsy is a disorder in which a person has repeated seizures over time. A seizure is a release of abnormal electrical activity in the brain. Seizures can cause a change in attention, behavior, or the ability to remain awake and alert (altered mental status). Seizures often involve uncontrollable shaking (convulsions).  Most people with epilepsy lead normal lives. However, people with epilepsy are at an increased risk of falls, accidents, and injuries. Therefore, it is important to begin treatment right away. CAUSES  Epilepsy has many possible causes. Anything that disturbs the normal pattern of brain cell activity can lead to seizures. This may include:   Head injury.  Birth trauma.  High fever as a child.  Stroke.  Bleeding into or around the brain.  Certain drugs.  Prolonged low oxygen, such as what occurs after CPR efforts.  Abnormal brain development.  Certain illnesses, such as meningitis, encephalitis (brain infection), malaria, and other infections.  An imbalance of nerve signaling chemicals (neurotransmitters).  SIGNS AND SYMPTOMS  The symptoms of a seizure can vary greatly from one person to another. Right before a seizure, you may have a warning (aura) that a seizure is about to occur. An aura may include the following symptoms:  Fear or anxiety.  Nausea.  Feeling like the room is spinning (vertigo).  Vision changes, such as seeing flashing lights or spots. Common symptoms during a seizure include:  Abnormal sensations, such as an abnormal smell or a bitter taste in the mouth.   Sudden, general body stiffness.   Convulsions that involve rhythmic jerking of the face, arm, or leg on one or both sides.   Sudden change in consciousness.   Appearing to be awake but not responding.   Appearing to be asleep but cannot be  awakened.   Grimacing, chewing, lip smacking, drooling, tongue biting, or loss of bowel or bladder control. After a seizure, you may feel sleepy for a while. DIAGNOSIS  Your health care provider will ask about your symptoms and take a medical history. Descriptions from any witnesses to your seizures will be very helpful in the diagnosis. A physical exam, including a detailed neurological exam, is necessary. Various tests may be done, such as:   An electroencephalogram (EEG). This is a painless test of your brain waves. In this test, a diagram is created of your brain waves. These diagrams can be interpreted by a specialist.  An MRI of the brain.   A CT scan of the brain.   A spinal tap (lumbar puncture, LP).  Blood tests to check for signs of infection or abnormal blood chemistry. TREATMENT  There is no cure for epilepsy, but it is generally treatable. Once epilepsy is diagnosed, it is important to begin treatment as soon as possible. For most people with epilepsy, seizures can be controlled with medicines. The following may also be used:  A pacemaker for the brain (vagus nerve stimulator) can be used for people with seizures that are not well controlled by medicine.  Surgery on the brain. For some people, epilepsy eventually goes away. HOME CARE INSTRUCTIONS   Follow your health care provider's recommendations on driving and safety in normal activities.  Get enough rest. Lack of sleep can cause seizures.  Only take over-the-counter or prescription medicines as directed by your health care provider. Take any prescribed medicine exactly as directed.  Avoid any known triggers of your seizures.  Keep a seizure diary. Record what you recall about any seizure, especially any possible trigger.   Make sure the people you live and work with know that you are prone to seizures. They should receive instructions on how to help you. In general, a witness to a seizure should:   Cushion  your head and body.   Turn you on your side.   Avoid unnecessarily restraining you.   Not place anything inside your mouth.   Call for emergency medical help if there is any question about what has occurred.   Follow up with your health care provider as directed. You may need regular blood tests to monitor the levels of your medicine.  SEEK MEDICAL CARE IF:   You develop signs of infection or other illness. This might increase the risk of a seizure.   You seem to be having more frequent seizures.   Your seizure pattern is changing.  SEEK IMMEDIATE MEDICAL CARE IF:   You have a seizure that does not stop after a few moments.   You have a seizure that causes any difficulty in breathing.   You have a seizure that results in a very severe headache.   You have a seizure that leaves you with the inability to speak or use a part of your body.  Document Released: 07/28/2005 Document Revised: 05/18/2013 Document Reviewed: 03/09/2013 [facility] Patient Information 2015 [place], [place]. This information is not intended to replace advice given to you by your health care provider. Make sure you discuss any questions you have with your health care provider.

## 2014-10-24 NOTE — Progress Notes (Signed)
Reason for visit: Seizures  [name] is a 59 y.o. female  History of present illness:  [name] is a 59 year old right-handed white female with a history of intractable headaches, the patient has chronic daily headaches. The patient also has a history of seizures as does one of her brothers. The patient indicates that the seizure frequency has increased recently. She will have onset of trembling throughout the body, then lose consciousness. She may have urinary incontinence, no tongue biting. She may be unconscious for about 5 minutes. The patient has had at least 3 seizures within the last month. The last seizure was 3 days ago. The patient does not operate a motor vehicle. She is on Topamax and Lamictal without full control of her seizures.  The patient also reports onset of blackout events that are generally associated with bouts of severe prolonged coughing. The patient smokes cigarettes, she has tried to cut back on her smoking, but she continues to smoke. She will have events where she will cough frequently, start feeling dizzy, and then have a brief episode of loss of consciousness unassociated with jerking, she may be unconscious for about 5 minutes. These episodes are occurring frequently as well.  The patient also reports episodes of vertigo that may occur while lying down, rolling to the right side. If the patient can avoid rolling on the right side, she does not usually get dizzy. The patient denies any nausea or vomiting.  The patient continues to have chronic daily headaches associated with some neck discomfort. Medications so far not been extremely helpful in controlling her headaches.  The patient has gait instability, she uses a walker, she reports a lot of bilateral knee pain and arthritis issues. She denies any numbness or tenderness sensations in the legs, no visual changes.    Past Medical History  Diagnosis Date  . Allergic rhinitis   . Hypertension   .  Hyperlipidemia   . GERD (gastroesophageal reflux disease)   . Glaucoma   . Neck pain     Cervical disc  . IBS (irritable bowel syndrome)   . Esophageal stricture   . Diverticulosis   . Right leg DVT 1982    groin  . COPD (chronic obstructive pulmonary disease)   . Emphysema   . Pneumonia 2012; 04/27/2012  . Chronic bronchitis     "keep it all the time" (04/27/2012)  . Exertional dyspnea   . Type II diabetes mellitus     No meds since weight loss  . Migraine   . Epilepsy   . DJD (degenerative joint disease)   . Arthritis     "hands and legs and feet" (04/27/2012)  . Depression   . OSA on CPAP     6-7 yrs  . UTI (urinary tract infection)     2 weeks ago  . Cancer 05    rt arm mole rem cancer  . Vertigo 10/24/2014    Past Surgical History  Procedure Laterality Date  . Tubal ligation  2001  . Knee arthroscopy  1990's    left  . Carpal tunnel release  ~ 2008    right  . Shoulder open rotator cuff repair  ~ 2010    left  . Abdominal hysterectomy  2001    "partial"  . 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 Total knee arthroplasty  08/23/2012    Procedure: TOTAL KNEE ARTHROPLASTY;  Surgeon: Lorn Junes, MD;  Location: [place];  Service: Orthopedics;  Laterality: Right;  RIGHT KNEE ARTHROPLASTY MEDIAL AND LATERAL COMPARTMENTS WITH PATELLA RESURFACING  . Cataract extraction Bilateral     Family History  Problem Relation Age of Onset  . Heart disease Maternal Grandfather   . Diabetes Maternal Grandfather   . Heart disease      uncle  . Diabetes      uncles/aunts  . Breast cancer Maternal Aunt   . Alcohol abuse Father   . Lung cancer Father     was a smoker  . Emphysema Father     was a smoker  . Bipolar disorder Son   . Muscular dystrophy Son   . Heart disease Sister   . Colon cancer Neg Hx   . Lung cancer Mother     was a smoker  . Stroke Mother   . Other Sister     blood clotting disorder  . Heart disease Son   . Seizures Brother     Social  history:  reports that she has been smoking Cigarettes.  She started smoking about 39 years ago. She has a 18 pack-year smoking history. She quit smokeless tobacco use about 2 years ago. She reports that she does not drink alcohol or use illicit drugs.  Medications:  Prior to Admission medications   Medication Sig Start Date End Date Taking? Authorizing Provider  albuterol (PROAIR HFA) 108 (90 BASE) MCG/ACT inhaler Inhale 2 puffs into the lungs every 6 (six) hours as needed. Shortness of breath 08/15/14  Yes Leeanne Rio, MD  albuterol (PROVENTIL) (2.5 MG/3ML) 0.083% nebulizer solution Take 3 mLs (2.5 mg total) by nebulization every 6 (six) hours as needed. For shortness of breath 09/22/14  Yes Leeanne Rio, MD  aspirin EC 81 MG tablet Take 1 tablet (81 mg total) by mouth daily. 10/04/12  Yes Leeanne Rio, MD  calcium citrate (CALCITRATE - DOSED IN MG ELEMENTAL CALCIUM) 950 MG tablet Take 1 tablet (200 mg of elemental calcium total) by mouth 2 (two) times daily. 10/16/14  Yes Leeanne Rio, MD  cholecalciferol (VITAMIN D) 400 UNITS TABS tablet Take 2 tablets (800 Units total) by mouth daily. 10/16/14  Yes Leeanne Rio, MD  ferrous sulfate 325 (65 FE) MG tablet Take 1 tablet (325 mg total) by mouth 2 (two) times daily with a meal. 09/06/12  Yes Donita Brooks, NP  fluticasone (FLONASE) 50 MCG/ACT nasal spray Place 2 sprays into both nostrils daily as needed. For congestion. 10/18/14  Yes Leeanne Rio, MD  guaiFENesin (MUCINEX) 600 MG 12 hr tablet Take 1 tablet (600 mg total) by mouth 2 (two) times daily. 03/21/14  Yes Renee A Kuneff, DO  HYDROcodone-acetaminophen (NORCO/VICODIN) 5-325 MG per tablet Take 1 tablet by mouth 2 (two) times daily as needed for moderate pain. 10/06/14  Yes Leeanne Rio, MD  ibuprofen (ADVIL,MOTRIN) 200 MG tablet Take 200 mg by mouth every 6 (six) hours as needed (migraine).   Yes Historical Provider, MD  lamoTRIgine (LAMICTAL) 150 MG tablet  Take 1 tablet (150 mg total) by mouth 2 (two) times daily. 09/29/14  Yes Kathrynn Ducking, MD  Loratadine 10 MG CAPS Take 1 capsule (10 mg total) by mouth daily. 05/23/14  Yes Leeanne Rio, MD  mometasone-formoterol [facility]) 200-5 MCG/ACT AERO Inhale 2 puffs into the lungs 2 (two) times daily. 10/18/14  Yes Leeanne Rio, MD  omeprazole (PRILOSEC) 40 MG capsule Take 1 capsule (40 mg total) by mouth 2 (two) times daily.  07/19/14  Yes Leeanne Rio, MD  predniSONE (DELTASONE) 50 MG tablet Take 1 tablet (50 mg total) by mouth daily with breakfast. 08/16/14  Yes Timmothy Euler, MD  QUEtiapine (SEROQUEL) 50 MG tablet Take 50 mg by mouth at bedtime.   Yes Historical Provider, MD  sertraline (ZOLOFT) 50 MG tablet Take 1 tablet (50 mg total) by mouth daily. 08/22/14  Yes Leeanne Rio, MD  simvastatin (ZOCOR) 40 MG tablet Take 1 tablet (40 mg total) by mouth at bedtime. 08/15/14  Yes Leeanne Rio, MD  tiotropium (SPIRIVA) 18 MCG inhalation capsule Place 1 capsule (18 mcg total) into inhaler and inhale daily. 04/21/14  Yes Leeanne Rio, MD  topiramate (TOPAMAX) 100 MG tablet 1tab in the am, 2 at bedtime 11/24/13  Yes Otilio Jefferson, NP      Allergies  Allergen Reactions  . Amoxicillin Anaphylaxis    Breakout, mouth swells  . Azithromycin Swelling and Rash    "swelling all over, high, thrush"  . Doxycycline Hyclate Anaphylaxis and Rash    swelling  . Levofloxacin Anaphylaxis  . Penicillins Shortness Of Breath and Rash    "almost died, ended up in hospital"  . Sulfonamide Derivatives Shortness Of Breath, Swelling and Rash    "break out from head to toe"  . Chantix [Varenicline] Other (See Comments)    "bad dreams, difficulty breathing"  . Clarithromycin Hives    swelling  . Nortriptyline Other (See Comments)    Per patient had "kidney problems" on this medication, could not use bathroom (urinary retention), swelling and breakout  . Codeine Nausea And Vomiting  .  Fluticasone-Salmeterol Other (See Comments)    REACTION: ulcers in mouth    ROS:  Out of a complete 14 system review of symptoms, the patient complains only of the following symptoms, and all other reviewed systems are negative.  Leg swelling Daytime sleepiness, snoring Joint pain, joint swelling, back pain, achy muscles, muscle cramps, walking difficulties, neck pain Dizziness, headache, numbness, seizures, speech difficulty, weakness, tremors, passing out, facial droop  Blood pressure 130/65, pulse 77, height 5\' 1"  (1.549 m), weight 185 lb 12.8 oz (84.278 kg).  Physical Exam  General: The patient is alert and cooperative at the time of the examination. The patient is moderately to markedly obese.  Eyes: Pupils are equal, round, and reactive to light. Discs are flat bilaterally.  Neck: The neck is supple, no carotid bruits are noted.  Respiratory: The respiratory examination is clear.  Cardiovascular: The cardiovascular examination reveals a regular rate and rhythm, no obvious murmurs or rubs are noted.  Skin: Extremities are without significant edema.  Neurologic Exam  Mental status: The patient is alert and oriented x 3 at the time of the examination. The patient has apparent normal recent and remote memory, with an apparently normal attention span and concentration ability.  Cranial nerves: Facial symmetry is present. There is good sensation of the face to pinprick and soft touch bilaterally. The strength of the facial muscles and the muscles to head turning and shoulder shrug are normal bilaterally. Speech is well enunciated, no aphasia or dysarthria is noted. Extraocular movements are full. The patient has coarse end-gaze nystagmus bilaterally. Visual fields are full. The tongue is midline, and the patient has symmetric elevation of the soft palate. No obvious hearing deficits are noted.  Motor: The motor testing reveals 5 over 5 strength of all 4 extremities. Good symmetric  motor tone is noted throughout.  Sensory: Sensory testing is  intact to pinprick, soft touch, vibration sensation, and position sense on all 4 extremities. No evidence of extinction is noted.  Coordination: Cerebellar testing reveals good finger-nose-finger and heel-to-shin bilaterally. The Nyan-Barrany procedure was unremarkable.  Gait and station: Gait is slightly wide-based, unsteady. The patient walks with a walker. Tandem gait is unsteady. Romberg is negative.  Reflexes: Deep tendon reflexes are symmetric, but are depressed bilaterally. Toes are downgoing bilaterally.   Assessment/Plan:  1. Intractable seizures  2. Tussive syncope  3. Chronic daily headache  4. Positional vertigo  5. Gait disturbance  This patient has multiple issues with blacking out, dizziness, and gait instability as well as headache. She indicates that the seizures have become more frequent, and the positional vertigo has come on recently. The patient will be set up for MRI of the brain, and she will have an EEG study. The Lamictal dose will be increased, the last level in December 2015 was 5.0. She will follow-up in 3 or 4 months. The patient may benefit from a referral to vestibular rehabilitation. If the seizures cannot be controlled, video EEG monitoring in the future may be done.  Jill Alexanders MD 10/24/2014 7:42 PM  [facility] [address] [place], [place] [address]  Phone [phone] Fax [phone]

## 2014-10-26 ENCOUNTER — Telehealth: Payer: Self-pay | Admitting: Neurology

## 2014-10-26 NOTE — Telephone Encounter (Signed)
Last OV notes says dose has ben increased.  I called back.  Spoke with [employer].  She will notate the patient file.

## 2014-10-26 NOTE — Telephone Encounter (Signed)
[name] with [name] @ [phone], received Rx lamoTRIgine (LAMICTAL) 200 MG tablet, but patient had 150 mg filled last month.  Questioning which is correct?  Please call and advise.

## 2014-11-01 ENCOUNTER — Telehealth: Payer: Self-pay | Admitting: Neurology

## 2014-11-01 ENCOUNTER — Ambulatory Visit (INDEPENDENT_AMBULATORY_CARE_PROVIDER_SITE_OTHER): Payer: Medicaid Other | Admitting: Neurology

## 2014-11-01 DIAGNOSIS — R569 Unspecified convulsions: Secondary | ICD-10-CM | POA: Diagnosis not present

## 2014-11-01 DIAGNOSIS — R55 Syncope and collapse: Secondary | ICD-10-CM

## 2014-11-01 DIAGNOSIS — R05 Cough: Secondary | ICD-10-CM

## 2014-11-01 DIAGNOSIS — R42 Dizziness and giddiness: Secondary | ICD-10-CM

## 2014-11-01 NOTE — Telephone Encounter (Signed)
I called the patient. The EEG study that was done today was normal. I will call her when I get the MRI results. If the seizures continue in spite of antiepileptic medication therapy, we may consider a video EEG monitoring study.

## 2014-11-01 NOTE — Procedures (Signed)
    History:  [name] is a 59 year old patient with a history of intractable seizures. The patient has had increasing frequency of seizures in spite of treatment with 2 different antiepileptic medications. The patient has episodes of trembling and then loss of consciousness. She is being evaluated for the seizures.  This is a routine EEG. No skull defects are noted. Medications include albuterol, aspirin, calcium supplementation, vitamin D, iron supplementation, Flonase, guaifenesin, hydrocodone, ibuprofen, Lamictal, loratadine, Dulera, prednisone, Seroquel, Zoloft, Zocor, Spiriva, and Topamax.   EEG classification: Normal awake and drowsy  Description of the recording: The background rhythms of this recording consists of a fairly well modulated medium amplitude alpha rhythm of 8 Hz that is reactive to eye opening and closure. As the record progresses, the patient appears to remain in the waking state throughout the recording. Photic stimulation was performed, resulting in a bilateral and symmetric photic driving response. Hyperventilation was also performed, resulting in a minimal buildup of the background rhythm activities without significant slowing seen. Toward the end of the recording, the patient enters the drowsy state with slight symmetric slowing seen. The patient never enters stage II sleep. At no time during the recording does there appear to be evidence of spike or spike wave discharges or evidence of focal slowing. EKG monitor shows no evidence of cardiac rhythm abnormalities with a heart rate of 72.  Impression: This is a normal EEG recording in the waking and drowsy state. No evidence of ictal or interictal discharges are seen.

## 2014-11-13 ENCOUNTER — Ambulatory Visit: Payer: Medicaid Other | Admitting: Family Medicine

## 2014-11-14 ENCOUNTER — Ambulatory Visit
Admission: RE | Admit: 2014-11-14 | Discharge: 2014-11-14 | Disposition: A | Discharge status: Home / Self Care | Payer: Medicaid Other | Source: Ambulatory Visit | Attending: Neurology | Admitting: Neurology

## 2014-11-14 ENCOUNTER — Telehealth: Payer: Self-pay | Admitting: Neurology

## 2014-11-14 ENCOUNTER — Encounter (INDEPENDENT_AMBULATORY_CARE_PROVIDER_SITE_OTHER): Payer: Medicaid Other | Admitting: Diagnostic Neuroimaging

## 2014-11-14 ENCOUNTER — Other Ambulatory Visit: Payer: Self-pay | Admitting: *Deleted

## 2014-11-14 DIAGNOSIS — R05 Cough: Secondary | ICD-10-CM

## 2014-11-14 DIAGNOSIS — F410 Panic disorder [episodic paroxysmal anxiety] without agoraphobia: Secondary | ICD-10-CM

## 2014-11-14 DIAGNOSIS — R569 Unspecified convulsions: Secondary | ICD-10-CM

## 2014-11-14 DIAGNOSIS — R55 Syncope and collapse: Secondary | ICD-10-CM

## 2014-11-14 DIAGNOSIS — R42 Dizziness and giddiness: Secondary | ICD-10-CM

## 2014-11-14 MED ORDER — SERTRALINE HCL 50 MG PO TABS: 50.0000 mg | ORAL_TABLET | Freq: Every day | ORAL | Status: DC

## 2014-11-14 MED ORDER — LORATADINE 10 MG PO CAPS: 10.0000 mg | ORAL_CAPSULE | Freq: Every day | ORAL | Status: DC

## 2014-11-14 NOTE — Telephone Encounter (Signed)
I called patient. Minimal change in the MRI findings from 2011. If the seizures do not abate with increase in medication, video EEG monitoring may be necessary.   MRI brain 11/14/2014:  IMPRESSION:  Equivocal MRI brain (without) demonstrating: 1. Few small scattered periventricular and subcortical foci of non-specific gliosis / T2 hyperintensities. These findings are non-specific and considerations include autoimmune, inflammatory, post-infectious, microvascular ischemic or migraine associated etiologies.  2. No acute findings. 3. Compared to MRI on 03/30/10, there is 1 new small T2 hyperintense lesion (series 9 image 16) in the right periventricular frontal region. Otherwise no change.

## 2014-11-17 ENCOUNTER — Other Ambulatory Visit: Payer: Self-pay

## 2014-11-17 MED ORDER — TOPIRAMATE 100 MG PO TABS: ORAL_TABLET | ORAL | Status: DC

## 2014-11-20 ENCOUNTER — Ambulatory Visit (INDEPENDENT_AMBULATORY_CARE_PROVIDER_SITE_OTHER): Payer: Medicaid Other | Admitting: Family Medicine

## 2014-11-20 ENCOUNTER — Encounter: Payer: Self-pay | Admitting: Family Medicine

## 2014-11-20 VITALS — BP 113/72 | HR 87 | Temp 98.2°F | Ht 61.0 in | Wt 179.7 lb

## 2014-11-20 DIAGNOSIS — M1712 Unilateral primary osteoarthritis, left knee: Secondary | ICD-10-CM | POA: Diagnosis not present

## 2014-11-20 DIAGNOSIS — G8929 Other chronic pain: Secondary | ICD-10-CM

## 2014-11-20 DIAGNOSIS — R51 Headache: Secondary | ICD-10-CM

## 2014-11-20 MED ORDER — DEXAMETHASONE SODIUM PHOSPHATE 4 MG/ML IJ SOLN: 4.0000 mg | Freq: Once | INTRAMUSCULAR | Status: DC

## 2014-11-20 MED ORDER — KETOROLAC TROMETHAMINE 60 MG/2ML IM SOLN
60.0000 mg | Freq: Once | INTRAMUSCULAR | Status: AC
  Administered 2014-11-20: 60 mg via INTRAMUSCULAR

## 2014-11-20 MED ORDER — HYDROCODONE-ACETAMINOPHEN 5-325 MG PO TABS: 1.0000 | ORAL_TABLET | Freq: Three times a day (TID) | ORAL | Status: DC | PRN

## 2014-11-20 MED ORDER — DEXAMETHASONE SODIUM PHOSPHATE 10 MG/ML IJ SOLN
4.0000 mg | Freq: Once | INTRAMUSCULAR | Status: AC
  Administered 2014-11-20: 4 mg via INTRAMUSCULAR

## 2014-11-20 NOTE — Progress Notes (Signed)
   Subjective:    Patient ID: [name], female    DOB: Feb 17, 1956, 59 y.o.   MRN: [id number]  HPI: Pt presents for f/u of chronic left knee pain and acute on chronic headache.  Left knee pain: present for years, worse for the past several months, worse with standing or walking - has been seen by Dr. Noemi Chapel who did her other knee replacement; he does not think she is a good surgical candidate for several reasons - reports she had significant issues with surgery in the past and is hesitant to have surgery by anyone (states several times she is "afraid" of surgery now) - reports she was taking Norco 5-325 mg, up to twice per day, for about 1 month, which helped take the edge off her pain and helped her get around more - reports she had a steroid injection by Dr. Noemi Chapel about 3 months ago, which helped for only 2-3 weeks - is unable to get PT due to having Medicaid and is unable to pay for it, out of pocket  Headaches: daily, "all her life," worse for the past week, with nausea - describes lightheadedness, throbbing / pulsatile, all-over pain, worse with light and loud noises - ibuprofen, Tylenol, etc OTC has not helped (almost every day) - reports she has had "shots" in the past (Toradol has helped), declined steroid shot last time but thinks it helped before that - of note, has a hx of seizure and is seeing neurology, on Lamictal and Topamax; has not had seizures since med adjustment  Of note, pt has COPD and smokes about 1 ppd. She has tried various things to quit and has not been unable to quit.  Review of Systems: As above. Denies fever / chills, vomiting, abdominal pain, constipation, change in bowel habits.     Objective:   Physical Exam  BP 113/72 mmHg  Pulse 87  Temp(Src) 98.2 F (36.8 C) (Oral)  Ht 5\' 1"  (1.549 m)  Wt 179 lb 11.2 oz (81.511 kg)  BMI 33.97 kg/m2 Gen: elderly adult female, appears older than stated age; walks with rolling walker HEENT: [place]/AT, EOMI, PERRLA,  MMM, TM's clear bilaterally  Nasal mucosae and posterior oropharynx clear  No sinus tenderness Neck: supple, normal ROM, no lymphadenopathy Cardio: heart sounds faint but RRR, no murmur appreciated Pulm: distant / faint breath sounds but CTAB, faint expiratory wheezes and protracted expiratory phase but breathing non-labored MSK: right knee with well-healed post-surgical scar  Left knee without frank effusion, redness / swelling, but is diffusely tender  Significant crepitus under examiner's hand held over patella, with leg moved through full passive ROM  Passive ROM full but slow due to pain, active ROM very slow and limited by pain Neuro: alert, oriented, strength intact to upper ext, 5/5 in RLE, 4+/5 in LLE secondary to pain Vascular: bilateral LE warm, well-perfused, DP pulses intact but faint, symmetric bilaterally     Assessment & Plan:  See problem list notes. F/u with PCP Dr. Ardelia Mems in about 3 weeks (appt made for May 6 at 11:15). No other med changes. F/u with neurologist as instructed, otherwise.

## 2014-11-20 NOTE — Assessment & Plan Note (Signed)
A: Continued gradual worsening of known chronic L knee pain with known tricompartmental osteoarthritis; pt is a poor surgical candidate in general, reports her orthopedist does not want to operate on her left knee, and she herself states several times she is "afraid" of surgery due to reported difficulty "waking up" from the last surgery. Exam consistent with severe osteoarthritis without concern for effusion or septic joint. Norco somewhat helpful with pain; not a candidate for tramadol use given seizure history.  P: Discussed various options with pt including refill of Norco (pt hesitant but agreeable), repeat knee injection (pt declines, at least at this point), second opinion for surgery (pt declines), and PT (unable to afford out of pocket, and pt has Medicaid, which will not pay for it). Norco refilled today, with instructions to use up to 3 times daily as needed (#45, no refills). F/u with PCP Dr. Ardelia Mems in about 3 weeks for re-eval, to see if narcotic use needs to be longer term or not, or if pt would like to do anything else in terms of seeing another surgeon.

## 2014-11-20 NOTE — Patient Instructions (Signed)
Thank you for coming in, today!  I'm sorry you're having so many difficult issues, right now. We will give you two shots today (Toradol, an NSAID medication; and Decadron, a steroid) for your headache. These might also help some with your knee. I will give you a new prescription for hydrocodone-acetaminophen for you knee pain, as well. You can take this medicine up to 3 times per day (every 8 hours) to see how it helps.  Come back to see Dr. Ardelia Mems on May 6th at 11:15 (appointment already made). Call or come back sooner than that if you need.  Please feel free to call with any questions or concerns at any time, at [phone]. --Dr. Venetia Maxon

## 2014-11-20 NOTE — Assessment & Plan Note (Signed)
A: Headache typical of chronic migraine-like headaches, without seizure-like activity. Uncertain trigger and no relief with OTC meds at home; possible component of medication overuse. Seeing neurology for this as well as seizures / dizziness, to f/u with them next month. Exam without frank neurologic deficits.  P: IM injections of Toradol 60 mg and Decadron 4 mg given today as abortive therapy for presumed migraine headache, which may also help with chronic knee pain (see separate problem list note). Advised AGAINST using Norco for knee pain for headaches. Strongly advised f/u with PCP Dr. Ardelia Mems and neurology as planned, or sooner if needed.

## 2014-11-22 NOTE — Progress Notes (Signed)
I was the preceptor on the day of this visit.   Shaelyn Decarli MD  

## 2014-11-24 ENCOUNTER — Institutional Professional Consult (permissible substitution): Payer: Medicaid Other | Admitting: Pulmonary Disease

## 2014-11-28 ENCOUNTER — Telehealth: Payer: Self-pay

## 2014-11-28 NOTE — Telephone Encounter (Signed)
Patient has apt. With [facility] on 01/31/15 with  CM/Willis  . Patient has a balance of $30.00 dollars and she want's to know why I stated to patient that I will have Billing office to call her. Patient was fine with this.

## 2014-11-28 NOTE — Telephone Encounter (Signed)
[name] C aware and patient is aware.

## 2014-11-28 NOTE — Telephone Encounter (Signed)
[name], The patient has a $3.00 balance, she's ok. Thanks, Angie

## 2014-12-11 ENCOUNTER — Other Ambulatory Visit: Payer: Self-pay | Admitting: *Deleted

## 2014-12-11 DIAGNOSIS — J441 Chronic obstructive pulmonary disease with (acute) exacerbation: Secondary | ICD-10-CM

## 2014-12-11 MED ORDER — SIMVASTATIN 40 MG PO TABS: 40.0000 mg | ORAL_TABLET | Freq: Every day | ORAL | Status: DC

## 2014-12-11 MED ORDER — ALBUTEROL SULFATE HFA 108 (90 BASE) MCG/ACT IN AERS: 2.0000 | INHALATION_SPRAY | Freq: Four times a day (QID) | RESPIRATORY_TRACT | Status: DC | PRN

## 2014-12-15 ENCOUNTER — Ambulatory Visit: Payer: Medicaid Other | Admitting: Family Medicine

## 2014-12-20 ENCOUNTER — Ambulatory Visit: Payer: Medicaid Other | Admitting: Nurse Practitioner

## 2015-01-10 ENCOUNTER — Ambulatory Visit (INDEPENDENT_AMBULATORY_CARE_PROVIDER_SITE_OTHER): Payer: Medicaid Other | Admitting: Family Medicine

## 2015-01-10 ENCOUNTER — Other Ambulatory Visit: Payer: Self-pay | Admitting: *Deleted

## 2015-01-10 ENCOUNTER — Encounter: Payer: Self-pay | Admitting: Family Medicine

## 2015-01-10 VITALS — BP 120/69 | HR 78 | Temp 97.8°F | Ht 61.0 in | Wt 178.6 lb

## 2015-01-10 DIAGNOSIS — M1712 Unilateral primary osteoarthritis, left knee: Secondary | ICD-10-CM | POA: Diagnosis not present

## 2015-01-10 DIAGNOSIS — M25562 Pain in left knee: Secondary | ICD-10-CM | POA: Diagnosis not present

## 2015-01-10 DIAGNOSIS — F410 Panic disorder [episodic paroxysmal anxiety] without agoraphobia: Secondary | ICD-10-CM

## 2015-01-10 MED ORDER — HYDROCODONE-ACETAMINOPHEN 5-325 MG PO TABS: 1.0000 | ORAL_TABLET | Freq: Two times a day (BID) | ORAL | Status: DC | PRN

## 2015-01-10 MED ORDER — METHYLPREDNISOLONE ACETATE 40 MG/ML IJ SUSP
40.0000 mg | Freq: Once | INTRAMUSCULAR | Status: AC
  Administered 2015-01-10: 40 mg via INTRA_ARTICULAR

## 2015-01-10 MED ORDER — SERTRALINE HCL 50 MG PO TABS: 50.0000 mg | ORAL_TABLET | Freq: Every day | ORAL | Status: DC

## 2015-01-10 NOTE — Assessment & Plan Note (Signed)
Continued chronic pain. Norco has helped with pain somewhat. Left knee injected today with Depo-Medrol. Counseled patient that this will likely also help her headache. I will refer her back to her orthopedic physician for viscous supplementation. Will refill Norco as well. Patient signed chronic opiate contract today. Follow-up with me in 2-3 months.

## 2015-01-10 NOTE — Patient Instructions (Signed)
Take norco twice a day as needed - only when you really need it. Did knee injection today. Referring back to Dr. Noemi Chapel for the knee gel shots  Follow up with me in 2-3 months.  Be well, Dr. Ardelia Mems

## 2015-01-10 NOTE — Progress Notes (Signed)
Patient ID: [name], female   DOB: 1956-03-16, 59 y.o.   MRN: [id number]  HPI:  Follow-up left knee pain: Previously take Norco but ran out of it. Tolerates this medicine and it does take some of the pain away. It does not completely resolve her pain. She is somewhat wary of continuing this medication. Her last knee injection was over 3 months ago. She has had viscous supplementation in the past in her right knee prior to her right knee replacement, but has not had these shots done in her left knee. They did help her pain in her right kn she would be willing to try these in her left knee. She has not followed up with Dr. Noemi Chapel, her orthopedic doctor.  Pt also endorses her usual daily headache and requests medication for this.  ROS: See HPI.  [place]: History of diastolic CHF, pulmonary hypertension, COPD, smoking, GERD, chronic daily headaches  PHYSICAL EXAM: BP 120/69 mmHg  Pulse 78  Temp(Src) 97.8 F (36.6 C) (Oral)  Ht 5\' 1"  (1.549 m)  Wt 178 lb 9.6 oz (81.012 kg)  BMI 33.76 kg/m2 Gen: NAD HEENT: [place]AT Heart:  Lungs: NWOB, speaks in full sentences without distress Neuro: grossly nonfocal speech normal, gait slightly antalgic on L but steady ambulation with walker Ext: L knee without erythema or effusion. nontender to palpation.  PROCEDURE NOTE: After informed written consent was obtained, patient was seated on exam table. Left knee was prepped with alcohol swab. Utilizing anterolateral approach, patient's left knee was injected intraarticularly with mixture of 1cc of depomedrol 40mg /mL and 4cc of 1% lidocaine without epinephrine. Patient tolerated the procedure well without immediate complications.   ASSESSMENT/PLAN:  DJD (degenerative joint disease) of knee Continued chronic pain. Norco has helped with pain somewhat. Left knee injected today with Depo-Medrol. Counseled patient that this will likely also help her headache. I will refer her back to her orthopedic physician for  viscous supplementation. Will refill Norco as well. Patient signed chronic opiate contract today. Follow-up with me in 2-3 months.    FOLLOW UP: F/u in 2-3 mos for knee pain  [place] J. Ardelia Mems, [place]

## 2015-01-10 NOTE — Progress Notes (Signed)
One of the assigned preceptor. 

## 2015-01-17 ENCOUNTER — Institutional Professional Consult (permissible substitution): Payer: Medicaid Other | Admitting: Pulmonary Disease

## 2015-01-29 ENCOUNTER — Encounter: Payer: Self-pay | Admitting: Family Medicine

## 2015-01-29 ENCOUNTER — Ambulatory Visit (INDEPENDENT_AMBULATORY_CARE_PROVIDER_SITE_OTHER): Payer: Medicaid Other | Admitting: Family Medicine

## 2015-01-29 VITALS — BP 111/65 | HR 77 | Temp 99.4°F | Wt 179.0 lb

## 2015-01-29 DIAGNOSIS — G8929 Other chronic pain: Secondary | ICD-10-CM

## 2015-01-29 DIAGNOSIS — J441 Chronic obstructive pulmonary disease with (acute) exacerbation: Secondary | ICD-10-CM

## 2015-01-29 DIAGNOSIS — R51 Headache: Secondary | ICD-10-CM

## 2015-01-29 MED ORDER — PREDNISONE 50 MG PO TABS: 50.0000 mg | ORAL_TABLET | Freq: Every day | ORAL | Status: DC

## 2015-01-29 MED ORDER — METHYLPREDNISOLONE ACETATE 40 MG/ML IJ SUSP
40.0000 mg | Freq: Once | INTRAMUSCULAR | Status: AC
  Administered 2015-01-29: 40 mg via INTRAMUSCULAR

## 2015-01-29 NOTE — Patient Instructions (Signed)
Please share with your neurologist about the episode of slurred speech you had last week  Start prednisone 50mg  daily for 5 days  Giving steroid shot today for head and breathing Return if not improving in next several days  Be well, Dr. Ardelia Mems

## 2015-01-29 NOTE — Progress Notes (Signed)
Patient ID: [name], female   DOB: [birth year], 57 y.o.   MRN: [id number]  HPI:  Pt presents for a same day appointment to discuss COPD exacerbation and headache.  For last couple of days has felt increased SOB with cough. No fevers. Using albuterol, last used 1 hour ago. Eating and drinking well.  Has exacerbation of her chronic daily headaches. Head throbbing. This is her normal headache pain. When asked about confusion/weakness/etc patient reports she had a day last week when she had slurred speech during the whole day. Did not seek emergency evaluation at that time, despite family members encouraging her to do so. Has neuro appt scheduled in 2 days.  ROS: See HPI  [place]: hx COPD, BPPV, CHF, GERD, obesity, tobacco abuse, chronic daily headaches  PHYSICAL EXAM: BP 111/65 mmHg  Pulse 77  Temp(Src) 99.4 F (37.4 C)  Wt 179 lb (81.194 kg) Gen: NAD, pleasant, cooperative HEENT: NCAT, face symmetric, EOMI Heart: RRR no murmur Lungs: NWOB with occasional cough. Diminished air movement throughout with scattered expiratory wheezes. Speaks in full sentences without distress Neuro: speech normal. EOMI, face symmetric, full strength upper and lower extremities, gait normal  ASSESSMENT/PLAN:  1. COPD exacerbation: Pt ambulated here in clinic with no hypoxia (O2 sat remained 94-95% on room air).  I don't think she needs abx at this time as she has no fevers, and I am unable to rx antibiotic as she is allergic to essentially all outpatient COPD medicines (doxycycline, levaquin, bactrim, penicillins, azithromycin). Will provide rx for prednisone 50mg  daily x 5 days to treat flare. As pt reports she won't be able to get this until tomorrow, will give depomedrol 40mg  IM today for more immediate antiinflammatory therapy. F/u if not improving within next several days.  2. Headache: neuro exam nonfocal. Suspect cough has exacerbated her chronic headaches. Depomedrol should help headache. I am concerned  about her report of episode of slurred speech for one day last week. Speech is normal today. Pt with recent MRI brain. As she has neuro follow up scheduled in 2 days, I have asked her to bring this episode to her neurologist's attention so they can make recommendations about what further workup is warranted.  FOLLOW UP: F/u as needed if symptoms worsen or do not improve. Discuss slurred speech episode with neurologist in 2 days.  [place]. Ardelia Mems, [place]

## 2015-01-31 ENCOUNTER — Ambulatory Visit: Payer: Medicaid Other | Admitting: Nurse Practitioner

## 2015-02-08 ENCOUNTER — Encounter: Payer: Self-pay | Admitting: Nurse Practitioner

## 2015-02-08 ENCOUNTER — Other Ambulatory Visit: Payer: Self-pay | Admitting: *Deleted

## 2015-02-08 ENCOUNTER — Ambulatory Visit (INDEPENDENT_AMBULATORY_CARE_PROVIDER_SITE_OTHER): Payer: Medicaid Other | Admitting: Nurse Practitioner

## 2015-02-08 VITALS — BP 119/73 | HR 86 | Ht 61.0 in | Wt 182.4 lb

## 2015-02-08 DIAGNOSIS — R569 Unspecified convulsions: Secondary | ICD-10-CM

## 2015-02-08 DIAGNOSIS — Z5181 Encounter for therapeutic drug level monitoring: Secondary | ICD-10-CM | POA: Diagnosis not present

## 2015-02-08 DIAGNOSIS — G43019 Migraine without aura, intractable, without status migrainosus: Secondary | ICD-10-CM

## 2015-02-08 NOTE — Patient Instructions (Addendum)
Will set up for video EEG through [place] labs today Continue Lamictal and Topamax at current doses Recent MRI and EEG was normal

## 2015-02-08 NOTE — Progress Notes (Signed)
[facility]  PATIENT: [name] DOB: 04/07/1956   REASON FOR VISIT: Follow-up for gait abnormality, intractable seizure disorder and chronic headaches HISTORY FROM: Patient    HISTORY OF PRESENT ILLNESS HISTORY 10/24/14 ( KW)[name] is a 59 year old right-handed white female with a history of intractable headaches, the patient has chronic daily headaches. The patient also has a history of seizures as does one of her brothers. The patient indicates that the seizure frequency has increased recently. She will have onset of trembling throughout the body, then lose consciousness. She may have urinary incontinence, no tongue biting. She may be unconscious for about 5 minutes. The patient has had at least 3 seizures within the last month. The last seizure was 3 days ago. The patient does not operate a motor vehicle. She is on Topamax and Lamictal without full control of her seizures. The patient also reports onset of blackout events that are generally associated with bouts of severe prolonged coughing. The patient smokes cigarettes, she has tried to cut back on her smoking, but she continues to smoke. She will have events where she will cough frequently, start feeling dizzy, and then have a brief episode of loss of consciousness unassociated with jerking, she may be unconscious for about 5 minutes. These episodes are occurring frequently as well. The patient also reports episodes of vertigo that may occur while lying down, rolling to the right side. If the patient can avoid rolling on the right side, she does not usually get dizzy. The patient denies any nausea or vomiting. The patient continues to have chronic daily headaches associated with some neck discomfort. Medications so far not been extremely helpful in controlling her headaches. The patient has gait instability, she uses a walker, she reports a lot of bilateral knee pain and arthritis issues. She denies any numbness or  tenderness sensations in the legs, no visual changes.  UPDATE 02/08/15 [name], 59 year old female returns for follow-up she has a long history of intractable headaches and recently received 5 days of prednisone by primary care without benefit. She also has seizure disorder. With her seizure events she will have trembling throughout the body then lose consciousness, she may have urinary incontinence Tongue biting. She has an episode of slurred speech several weeks ago with confusion and headache. She is on Topamax 100 mg 1 in the morning and 2 in the evening and Lamictal 200 mg twice a day. She does not operate a motor vehicle. She has a gait disorder and uses a walker, bilateral knee pain and knee replacement on the right. EEG after last visit was normal.  MRI of the brain with minimal change from 2011. She also has chronic obstructive pulmonary disease and sleep apnea, she uses CPAP. She returns for reevaluation   REVIEW OF SYSTEMS: Full 14 system review of systems performed and notable only for those listed, all others are neg:  Constitutional: neg  Cardiovascular: neg Ear/Nose/Throat: neg  Skin: neg Eyes: neg Respiratory: neg Gastroitestinal: neg  Hematology/Lymphatic: Easy bruising  Endocrine: Excessive thirst Musculoskeletal: Joint pain joint swelling back pain muscle cramps walking difficulty neck pain Allergy/Immunology: neg Neurological: Dizziness headache weakness transverse speech difficulty passing out Psychiatric: neg Sleep : Daytime sleepiness, obstructive sleep apnea on CPAP ALLERGIES: Allergies  Allergen Reactions  . Amoxicillin Anaphylaxis    Breakout, mouth swells  . Azithromycin Swelling and Rash    "swelling all over, high, thrush"  . Doxycycline Hyclate Anaphylaxis and Rash    swelling  . Levofloxacin  Anaphylaxis  . Penicillins Shortness Of Breath and Rash    "almost died, ended up in hospital"  . Sulfonamide Derivatives Shortness Of Breath, Swelling and Rash     "break out from head to toe"  . Chantix [Varenicline] Other (See Comments)    "bad dreams, difficulty breathing"  . Clarithromycin Hives    swelling  . Nortriptyline Other (See Comments)    Per patient had "kidney problems" on this medication, could not use bathroom (urinary retention), swelling and breakout  . Codeine Nausea And Vomiting  . Fluticasone-Salmeterol Other (See Comments)    REACTION: ulcers in mouth    HOME MEDICATIONS: Outpatient Prescriptions Prior to Visit  Medication Sig Dispense Refill  . albuterol (PROAIR HFA) 108 (90 BASE) MCG/ACT inhaler Inhale 2 puffs into the lungs every 6 (six) hours as needed. Shortness of breath 1 Inhaler 3  . albuterol (PROVENTIL) (2.5 MG/3ML) 0.083% nebulizer solution Take 3 mLs (2.5 mg total) by nebulization every 6 (six) hours as needed. For shortness of breath 75 mL 6  . aspirin EC 81 MG tablet Take 1 tablet (81 mg total) by mouth daily.    . calcium citrate (CALCITRATE - DOSED IN MG ELEMENTAL CALCIUM) 950 MG tablet Take 1 tablet (200 mg of elemental calcium total) by mouth 2 (two) times daily. 60 tablet 5  . cholecalciferol (VITAMIN D) 400 UNITS TABS tablet Take 2 tablets (800 Units total) by mouth daily. 60 each 5  . ferrous sulfate 325 (65 FE) MG tablet Take 1 tablet (325 mg total) by mouth 2 (two) times daily with a meal. 60 tablet 3  . fluticasone (FLONASE) 50 MCG/ACT nasal spray Place 2 sprays into both nostrils daily as needed. For congestion. 16 g 5  . guaiFENesin (MUCINEX) 600 MG 12 hr tablet Take 1 tablet (600 mg total) by mouth 2 (two) times daily. 60 tablet 0  . HYDROcodone-acetaminophen (NORCO/VICODIN) 5-325 MG per tablet Take 1 tablet by mouth 2 (two) times daily as needed for moderate pain. 45 tablet 0  . ibuprofen (ADVIL,MOTRIN) 200 MG tablet Take 200 mg by mouth every 6 (six) hours as needed (migraine).    Marland Kitchen lamoTRIgine (LAMICTAL) 200 MG tablet Take 1 tablet (200 mg total) by mouth 2 (two) times daily. 60 tablet 5  .  Loratadine 10 MG CAPS Take 1 capsule (10 mg total) by mouth daily. 30 each 5  . mometasone-formoterol (DULERA) 200-5 MCG/ACT AERO Inhale 2 puffs into the lungs 2 (two) times daily. 13 g 5  . omeprazole (PRILOSEC) 40 MG capsule Take 1 capsule (40 mg total) by mouth 2 (two) times daily. 60 capsule 11  . predniSONE (DELTASONE) 50 MG tablet Take 1 tablet (50 mg total) by mouth daily with breakfast. 5 tablet 0  . QUEtiapine (SEROQUEL) 50 MG tablet Take 50 mg by mouth at bedtime.    . sertraline (ZOLOFT) 50 MG tablet Take 1 tablet (50 mg total) by mouth daily. 30 tablet 1  . simvastatin (ZOCOR) 40 MG tablet Take 1 tablet (40 mg total) by mouth at bedtime. 30 tablet 3  . tiotropium (SPIRIVA) 18 MCG inhalation capsule Place 1 capsule (18 mcg total) into inhaler and inhale daily. 30 capsule 11  . topiramate (TOPAMAX) 100 MG tablet 1tab in the am, 2 at bedtime 270 tablet 0   No facility-administered medications prior to visit.    PAST MEDICAL HISTORY: Past Medical History  Diagnosis Date  . Allergic rhinitis   . Hypertension   . Hyperlipidemia   .  GERD (gastroesophageal reflux disease)   . Glaucoma   . Neck pain     Cervical disc  . IBS (irritable bowel syndrome)   . Esophageal stricture   . Diverticulosis   . Right leg DVT 1982    groin  . COPD (chronic obstructive pulmonary disease)   . Emphysema   . Pneumonia 2012; 04/27/2012  . Chronic bronchitis     "keep it all the time" (04/27/2012)  . Exertional dyspnea   . Type II diabetes mellitus     No meds since weight loss  . Migraine   . Epilepsy   . DJD (degenerative joint disease)   . Arthritis     "hands and legs and feet" (04/27/2012)  . Depression   . OSA on CPAP     6-7 yrs  . UTI (urinary tract infection)     2 weeks ago  . Cancer 05    rt arm mole rem cancer  . Vertigo 10/24/2014    PAST SURGICAL HISTORY: Past Surgical History  Procedure Laterality Date  . Tubal ligation  2001  . Knee arthroscopy  1990's    left  .  Carpal tunnel release  ~ 2008    right  . Shoulder open rotator cuff repair  ~ 2010    left  . Abdominal hysterectomy  2001    "partial"  . 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 Total knee arthroplasty  08/23/2012    Procedure: TOTAL KNEE ARTHROPLASTY;  Surgeon: Lorn Junes, MD;  Location: [facility] [facility];  Service: Orthopedics;  Laterality: Right;  RIGHT KNEE ARTHROPLASTY MEDIAL AND LATERAL COMPARTMENTS WITH PATELLA RESURFACING  . Cataract extraction Bilateral     FAMILY HISTORY: Family History  Problem Relation Age of Onset  . Heart disease Maternal Grandfather   . Diabetes Maternal Grandfather   . Heart disease      uncle  . Diabetes      uncles/aunts  . Breast cancer Maternal Aunt   . Alcohol abuse Father   . Lung cancer Father     was a smoker  . Emphysema Father     was a smoker  . Bipolar disorder Son   . Muscular dystrophy Son   . Heart disease Sister   . Colon cancer Neg Hx   . Lung cancer Mother     was a smoker  . Stroke Mother   . Other Sister     blood clotting disorder  . Heart disease Son   . Seizures Brother     SOCIAL HISTORY: History   Social History  . Marital Status: Married    Spouse Name: [name]  . Number of Children: 2  . Years of Education: 12   Occupational History  . disabled    Social History Main Topics  . Smoking status: Current Every Day Smoker -- 0.50 packs/day for 36 years    Types: Cigarettes    Start date: 08/12/1975  . Smokeless tobacco: Former Systems developer    Quit date: 08/23/2012     Comment: started back smoking in 11/2012  . Alcohol Use: No  . Drug Use: No  . Sexual Activity: No   Other Topics Concern  . Not on file   Social History Narrative   Patient lives at home with husband [name], her son and his wife.    Patient has 2 children.    Patient has a high school education.    Patient is currently unemployed.  Patient is right handed.    Patient drinks 2 cups of caffeine per day.        PHYSICAL EXAM  Filed  Vitals:   02/08/15 1406  BP: 119/73  [name]: 86  Height: 5\' 1"  (1.549 m)  Weight: 182 lb 6.4 oz (82.736 kg)   Body mass index is 34.48 kg/(m^2). General: The patient is alert and cooperative at the time of the examination. The patient is moderately to markedly obese. Neck: The neck is supple, no carotid bruits are noted. Respiratory: The respiratory examination is clear. Cardiovascular: The cardiovascular examination reveals a regular rate and rhythm, no obvious murmurs or rubs are noted. Skin: Extremities are without significant edema.  Neurologic Exam  Mental status: The patient is alert and oriented x 3 at the time of the examination. The patient has apparent normal recent and remote memory, with an apparently normal attention span and concentration ability.  Cranial nerves: Facial symmetry is present. There is good sensation of the face to pinprick and soft touch bilaterally. The strength of the facial muscles and the muscles to head turning and shoulder shrug are normal bilaterally. Speech is well enunciated, no aphasia or dysarthria is noted. Pupils are equal, round, and reactive to light. Discs are flat bilaterally.Extraocular movements are full. The patient has coarse end-gaze nystagmus bilaterally. Visual fields are full. The tongue is midline, and the patient has symmetric elevation of the soft palate. No obvious hearing deficits are noted. Motor: The motor testing reveals 5 over 5 strength of all 4 extremities. Good symmetric motor tone is noted throughout. Sensory: Sensory testing is intact to pinprick, soft touch, vibration sensation, and position sense on all 4 extremities. No evidence of extinction is noted. Coordination: Cerebellar testing reveals good finger-nose-finger and heel-to-shin bilaterally.  Gait and station: Gait is slightly wide-based, unsteady. The patient walks with a walker. Tandem gait is unsteady. Romberg is negative. Reflexes: Deep tendon reflexes are  symmetric, but are depressed bilaterally. Toes are downgoing bilaterally.   DIAGNOSTIC DATA (LABS, IMAGING, TESTING) -   ASSESSMENT AND PLAN  59 y.o. year old female  has a past medical history of  1 intractable seizure disorder 2 chronic headaches  3 gait disturbance  Will set up for video EEG through [place] [facility] today Continue Lamictal and Topamax at current doses Recent MRI of the brain with minimal change  from 2011 and EEG was normal.  Dennie Bible, [facility], [facility], APRN  [facility] [address] [place], [place] [address] [phone]

## 2015-02-08 NOTE — Progress Notes (Signed)
Order for LT Video Ambulatory EEG (120 hours).

## 2015-02-08 NOTE — Progress Notes (Signed)
I have read the note, and I agree with the clinical assessment and plan.  WILLIS,CHARLES KEITH   

## 2015-02-09 ENCOUNTER — Other Ambulatory Visit: Payer: Self-pay

## 2015-02-09 LAB — CBC WITH DIFFERENTIAL/PLATELET
Basophils Absolute: 0 10*3/uL (ref 0.0–0.2)
Basos: 0 %
EOS (ABSOLUTE): 0.3 10*3/uL (ref 0.0–0.4)
EOS: 3 %
Hematocrit: 35.6 % (ref 34.0–46.6)
Hemoglobin: 11.9 g/dL (ref 11.1–15.9)
IMMATURE GRANULOCYTES: 0 %
Immature Grans (Abs): 0 10*3/uL (ref 0.0–0.1)
Lymphocytes Absolute: 3 10*3/uL (ref 0.7–3.1)
Lymphs: 32 %
MCH: 30.5 pg (ref 26.6–33.0)
MCHC: 33.4 g/dL (ref 31.5–35.7)
MCV: 91 fL (ref 79–97)
Monocytes Absolute: 0.7 10*3/uL (ref 0.1–0.9)
Monocytes: 7 %
NEUTROS PCT: 58 %
Neutrophils Absolute: 5.4 10*3/uL (ref 1.4–7.0)
PLATELETS: 252 10*3/uL (ref 150–379)
RBC: 3.9 x10E6/uL (ref 3.77–5.28)
RDW: 16.1 % — ABNORMAL HIGH (ref 12.3–15.4)
WBC: 9.5 10*3/uL (ref 3.4–10.8)

## 2015-02-09 LAB — COMPREHENSIVE METABOLIC PANEL
A/G RATIO: 1.4 (ref 1.1–2.5)
ALBUMIN: 3.9 g/dL (ref 3.5–5.5)
ALK PHOS: 84 IU/L (ref 39–117)
ALT: 6 IU/L (ref 0–32)
AST: 11 IU/L (ref 0–40)
BUN/Creatinine Ratio: 20 (ref 9–23)
BUN: 19 mg/dL (ref 6–24)
CHLORIDE: 107 mmol/L (ref 97–108)
CO2: 22 mmol/L (ref 18–29)
CREATININE: 0.93 mg/dL (ref 0.57–1.00)
Calcium: 8.9 mg/dL (ref 8.7–10.2)
GFR calc Af Amer: 79 mL/min/{1.73_m2} (ref >59)
GFR calc non Af Amer: 68 mL/min/{1.73_m2} (ref >59)
Globulin, Total: 2.8 g/dL (ref 1.5–4.5)
Glucose: 104 mg/dL — ABNORMAL HIGH (ref 65–99)
POTASSIUM: 4 mmol/L (ref 3.5–5.2)
Sodium: 144 mmol/L (ref 134–144)
Total Protein: 6.7 g/dL (ref 6.0–8.5)

## 2015-02-09 LAB — LAMOTRIGINE LEVEL: Lamotrigine Lvl: 4.6 ug/mL (ref 2.0–20.0)

## 2015-02-09 MED ORDER — TOPIRAMATE 100 MG PO TABS: ORAL_TABLET | ORAL | Status: DC

## 2015-02-09 NOTE — Progress Notes (Signed)
Quick Note:  I called and gave her the results of her lab work (looked good). She verbalized understanding. ______

## 2015-02-13 ENCOUNTER — Telehealth: Payer: Self-pay | Admitting: Nurse Practitioner

## 2015-02-13 ENCOUNTER — Other Ambulatory Visit: Payer: Medicaid Other

## 2015-02-13 NOTE — Telephone Encounter (Addendum)
Bertell Maria with [facility] is calling is regard to patient and states that the patient has Medicaid [place] so they will not be able to do order.  Please call.

## 2015-02-13 NOTE — Addendum Note (Signed)
Addended byOliver Hum on: 02/13/2015 11:22 AM   Modules accepted: Orders

## 2015-02-13 NOTE — Telephone Encounter (Addendum)
Noted.  Will send to [facility].  Order done.  Placed in CM/NP in box for signature.

## 2015-02-14 NOTE — Telephone Encounter (Signed)
Faxed to [facility] [phone].   I spoke to pt and let her know about the change to [facility] (due to insurance).  She verbalized understanding.

## 2015-03-09 ENCOUNTER — Other Ambulatory Visit: Payer: Self-pay | Admitting: *Deleted

## 2015-03-09 DIAGNOSIS — F410 Panic disorder [episodic paroxysmal anxiety] without agoraphobia: Secondary | ICD-10-CM

## 2015-03-09 MED ORDER — SERTRALINE HCL 50 MG PO TABS: 50.0000 mg | ORAL_TABLET | Freq: Every day | ORAL | Status: DC

## 2015-03-22 ENCOUNTER — Ambulatory Visit: Payer: Medicaid Other | Admitting: Family Medicine

## 2015-03-23 ENCOUNTER — Encounter: Payer: Self-pay | Admitting: Obstetrics and Gynecology

## 2015-03-23 ENCOUNTER — Ambulatory Visit (INDEPENDENT_AMBULATORY_CARE_PROVIDER_SITE_OTHER): Payer: Medicaid Other | Admitting: Obstetrics and Gynecology

## 2015-03-23 VITALS — BP 122/60 | HR 79 | Temp 98.0°F | Wt 183.0 lb

## 2015-03-23 DIAGNOSIS — J410 Simple chronic bronchitis: Secondary | ICD-10-CM

## 2015-03-23 DIAGNOSIS — R519 Headache, unspecified: Secondary | ICD-10-CM

## 2015-03-23 DIAGNOSIS — J441 Chronic obstructive pulmonary disease with (acute) exacerbation: Secondary | ICD-10-CM

## 2015-03-23 DIAGNOSIS — R51 Headache: Secondary | ICD-10-CM

## 2015-03-23 MED ORDER — METHYLPREDNISOLONE ACETATE 40 MG/ML IJ SUSP
40.0000 mg | Freq: Once | INTRAMUSCULAR | Status: AC
  Administered 2015-03-23: 40 mg via INTRAMUSCULAR

## 2015-03-23 NOTE — Patient Instructions (Signed)
Chronic Asthmatic Bronchitis Chronic asthmatic bronchitis is a complication of persistent asthma. After a period of time with asthma, some people develop airflow obstruction that is present all the time, even when not having an asthma attack.There is also persistent inflammation of the airways, and the bronchial tubes produce more mucus. Chronic asthmatic bronchitis usually is a permanent problem with the lungs. CAUSES  Chronic asthmatic bronchitis happens most often in people who have asthma and also smoke cigarettes. Occasionally, it can happen to a person with long-standing or severe asthma even if the person is not a smoker. SIGNS AND SYMPTOMS  Chronic asthmatic bronchitis usually causes symptoms of both asthma and chronic bronchitis, including:   Coughing.  Increased sputum production.  Wheezing and shortness of breath.  Chest discomfort.  Recurring infections. DIAGNOSIS  Your health care provider will take a medical history and perform a physical exam. Chronic asthmatic bronchitis is suspected when a person with asthma has abnormal results on breathing tests (pulmonary function tests) even when breathing symptoms are at their best. Other tests, such as a chest X-ray, may be performed to rule out other conditions.  TREATMENT  Treatment involves controlling symptoms with medicine and lifestyle changes.  Your health care provider may prescribe asthma medicines, including inhaler and nebulizer medicines.  Infection can be treated with medicine to kill germs (antibiotics). Serious infections may require hospitalization. These can include:  Pneumonia.  Sinus infections.  Acute bronchitis.   Preventing infection and hospitalization is very important. Get an influenza vaccination every year as directed by your health care provider. Ask your health care provider whether you need a pneumonia vaccine.  Ask your health care provider whether you would benefit from a pulmonary  rehabilitation program. HOME CARE INSTRUCTIONS  Take medicines only as directed by your health care provider.  If you are a cigarette smoker, the most important thing that you can do is quit. Talk to your health care provider for help with quitting smoking.  Avoid pollen, dust, animal dander, molds, smoke, and other things that cause attacks.  Regular exercise is very important to help you feel better. Discuss possible exercise routines with your health care provider.  If animal dander is the cause of asthma, you may not be able to keep pets.  It is important that you:  Become educated about your medical condition.  Participate in maintaining wellness.  Seek medical care as directed. Delay in seeking medical care could cause permanent injury and may be a risk to your life. SEEK MEDICAL CARE IF:  You have wheezing and shortness of breath even if taking medicine to prevent attacks.  You have muscle aches, chest pain, or thickening of sputum.  Your sputum changes from clear or white to yellow, green, gray, or bloody. SEEK IMMEDIATE MEDICAL CARE IF:  Your usual medicines do not stop your wheezing.  You have increased coughing or shortness of breath or both.  You have increased difficulty breathing.  You have any problems from the medicine you are taking, such as a rash, itching, swelling, or trouble breathing. MAKE SURE YOU:   Understand these instructions.  Will watch your condition.  Will get help right away if you are not doing well or get worse. Document Released: 05/15/2006 Document Revised: 12/12/2013 Document Reviewed: 09/05/2013 ExitCare Patient Information 2015 [employer]. This information is not intended to replace advice given to you by your health care provider. Make sure you discuss any questions you have with your health care provider.  

## 2015-03-24 ENCOUNTER — Encounter: Payer: Self-pay | Admitting: Obstetrics and Gynecology

## 2015-03-24 NOTE — Progress Notes (Signed)
   Subjective:   Patient ID: [name], female    DOB: October 25, 1955, 59 y.o.   MRN: [id number]  Patient presents for Same Day Appointment  CC: Coughing and headache  HPI: # Coughing:  Patient states she has had this chronic cough and cant sleep  She is producing yellow phlegm  No fevers, decreased appetite, weight loss  She has been using her home nebulizer treatments due to some SOB  Feels like it has been worsening for the last 2 weeks  Thinks it is getting worse  Uses robitussin for cough but not working  Has associated headache that is constant for the last couple of weeks  Rates it 10/10  Ibuprofen and tylenol not working  Patient still smoking 1ppd PMH of uncontrolled COPD and chronic headaches  Review of Systems   See HPI for ROS.   Objective:  BP 122/60 mmHg  Pulse 79  Temp(Src) 98 F (36.7 C) (Oral)  Wt 183 lb (83.008 kg)  SpO2 96%  Physical Exam Gen: NAD, pleasant Heart: RRR Lungs: NWOB with associated cough intermittently. Diminished air movement. No wheezes,crakles, or rales appreciated. Neuro: A&O, speech normal, normal strength, abnormal gait (walks with walker), no focal deficits  Assessment & Plan:  1. Chronic Bronchitis with associated chronic headache. Vitals signs stable and afebrile. Patient just received a steroid dose pack 2 months ago for same symptoms. This appears to be a chronic occurrence for patient. Suspect her coughing and lung disease are exacerbating her headaches.   -Depomedrol 40mg  given in office to help with both bronchitis flare and headache (anti-inflammatory proprieties) -no need for antibiotics at this time -discussed tobacco cessation  -also made patient aware that her symptoms are chronic and will not improve if she continues to smoke -no need for Abx at this time (patient with allergies to most outpatient Abx) -may need pulmonology referral but will leave to PCP -f/u if not improving in next several days  Luiz Blare, DO PGY-2, [place]

## 2015-04-09 ENCOUNTER — Institutional Professional Consult (permissible substitution): Payer: Medicaid Other | Admitting: Pulmonary Disease

## 2015-04-09 ENCOUNTER — Ambulatory Visit: Payer: Medicaid Other | Admitting: Internal Medicine

## 2015-04-10 ENCOUNTER — Other Ambulatory Visit: Payer: Self-pay

## 2015-04-10 ENCOUNTER — Other Ambulatory Visit: Payer: Self-pay | Admitting: *Deleted

## 2015-04-10 DIAGNOSIS — J441 Chronic obstructive pulmonary disease with (acute) exacerbation: Secondary | ICD-10-CM

## 2015-04-10 MED ORDER — ALBUTEROL SULFATE (2.5 MG/3ML) 0.083% IN NEBU: 2.5000 mg | INHALATION_SOLUTION | Freq: Four times a day (QID) | RESPIRATORY_TRACT | Status: DC | PRN

## 2015-04-10 MED ORDER — LAMOTRIGINE 200 MG PO TABS: 200.0000 mg | ORAL_TABLET | Freq: Two times a day (BID) | ORAL | Status: DC

## 2015-04-10 MED ORDER — ALBUTEROL SULFATE HFA 108 (90 BASE) MCG/ACT IN AERS: 2.0000 | INHALATION_SPRAY | Freq: Four times a day (QID) | RESPIRATORY_TRACT | Status: DC | PRN

## 2015-04-10 MED ORDER — FLUTICASONE PROPIONATE 50 MCG/ACT NA SUSP: 2.0000 | Freq: Every day | NASAL | Status: DC | PRN

## 2015-04-10 MED ORDER — CALCIUM CITRATE 950 (200 CA) MG PO TABS: 200.0000 mg | ORAL_TABLET | Freq: Two times a day (BID) | ORAL | Status: DC

## 2015-04-10 MED ORDER — MOMETASONE FURO-FORMOTEROL FUM 200-5 MCG/ACT IN AERO: 2.0000 | INHALATION_SPRAY | Freq: Two times a day (BID) | RESPIRATORY_TRACT | Status: DC

## 2015-04-10 MED ORDER — TIOTROPIUM BROMIDE MONOHYDRATE 18 MCG IN CAPS: 18.0000 ug | ORAL_CAPSULE | Freq: Every day | RESPIRATORY_TRACT | Status: DC

## 2015-04-10 MED ORDER — CHOLECALCIFEROL 10 MCG (400 UNIT) PO TABS: 800.0000 [IU] | ORAL_TABLET | Freq: Every day | ORAL | Status: DC

## 2015-04-13 ENCOUNTER — Encounter: Payer: Self-pay | Admitting: Family Medicine

## 2015-04-13 ENCOUNTER — Ambulatory Visit (INDEPENDENT_AMBULATORY_CARE_PROVIDER_SITE_OTHER): Payer: Medicaid Other | Admitting: Family Medicine

## 2015-04-13 VITALS — BP 117/78 | HR 80 | Temp 97.7°F | Ht 61.0 in | Wt 179.9 lb

## 2015-04-13 DIAGNOSIS — M1712 Unilateral primary osteoarthritis, left knee: Secondary | ICD-10-CM | POA: Diagnosis present

## 2015-04-13 MED ORDER — MELOXICAM 15 MG PO TABS: 15.0000 mg | ORAL_TABLET | Freq: Every day | ORAL | Status: DC

## 2015-04-13 MED ORDER — METHYLPREDNISOLONE ACETATE 80 MG/ML IJ SUSP
40.0000 mg | Freq: Once | INTRAMUSCULAR | Status: AC
  Administered 2015-04-13: 40 mg via INTRA_ARTICULAR

## 2015-04-13 NOTE — Patient Instructions (Signed)
Dear [name], Thank you for coming in to clinic today.  1. Your pain is most likely coming from your Left Knee Arthritis, muscles and soft tissue has some swelling. I am not very concerned for a blood clot today, but we would like you to return sooner to re-evaluate if knee pain doesn't get better with injection 2. Steroid injection in Left knee today, may be more painful for 24 hours then it should get better like last time 3. Take Meloxicam 15mg  daily with food for next 2 to 4 weeks as trial, this is strong anti inflammatory, do NOT take ibuprofen, aleve, naproxen, motrin at same time. May continue Tylenol 4. Elevate and rest leg, use compression sleeve, ice for swelling, heat for pain  If you develop any acute redness or swelling, worsening pain in left lower leg, or any respiratory symptoms shortness of breath, chest pain, do not hesitate to call and return sooner or go to Emergency Room.  Please schedule a follow-up appointment with Dr. Ola Spurr or myself to followup in 1-2 weeks for left knee pain  If you have any other questions or concerns, please feel free to call the clinic to contact me. You may also schedule an earlier appointment if necessary.  However, if your symptoms get significantly worse, please go to the Emergency Department to seek immediate medical attention.  Nobie Putnam, [place]

## 2015-04-13 NOTE — Assessment & Plan Note (Signed)
Consistent with acute on chronic OA flare in left knee with some localized soft tissue swelling medial joint space, but no acute knee effusion, erythema. No evidence of infection. 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 - Unlikely Left LE DVT with reported swelling (no acute symptoms of DVT today, known history 30 yr ago provoked DVT in RLE, but no current risk factors other than some limited mobility). - No longer on Norco (pt self discontinued), taking tylenol without relief, not on NSAID - Last steroid injection, L-knee 01/10/2015, lasted 1 month  Plan: 1. Repeat Left knee steroid injection - see procedure note for details, tolerated well, no complications 2. Start Mobic 15mg  daily x 1 month trial, last SCr 0.93 01/2015, no history of PUD 3. Continue Tylenol, topical conservative therapies, compression, RICE therapy 4. Follow-up with ortho as scheduled 5. RTC 1-2 weeks for re-evaluation, to make sure pain / swelling respond to injection / mobic, and reassurance no persistent LLE swelling or symptoms concerning for DVT, again clinically less likely. Strict return precautions given if worsening.

## 2015-04-13 NOTE — Progress Notes (Signed)
Subjective:    Patient ID: [name], female    DOB: March 27, 1956, 59 y.o.   MRN: [id number]  [name] is a 59 y.o. female presenting on 04/13/2015 for Leg Pain   HPI  LEFT KNEE PAIN / CHRONIC OSTEOARTHRITIS: - Last seen [facility] 01/10/15 for same complaint, dx with persistent OA, given Left knee steroid injection at that time (good relief for 1 month), additionally had been on Norco but since self discontinued (not helping and did not want to take anymore), followed by Dr. Noemi Chapel (Orthopedics) had been receiving synvisc injections, not seen recently. S/p Right TKR, considered high risk for Left knee due to COPD and other co-morbidities. - Today presents for follow-up with recent worsening of pain for past 1 week, mostly in anterior medial knee radiating down to posterior lower leg. Admits to localized swelling upper left leg, medial aspect of knee and posteriorly. - Last left knee x-ray 07/2014 with tricompartmental arthritis - Taking Tylenol 500mg  3-4 tabs q 6 hours without relief.  - History of DVT Right lower leg 30 years ago, provoked by pregnancy. No recent triggers with travel, surgery, unusual circumstances. Denies any redness or significant swelling in left calf or lower leg.   Past Medical History  Diagnosis Date  . Allergic rhinitis   . Hypertension   . Hyperlipidemia   . GERD (gastroesophageal reflux disease)   . Glaucoma   . Neck pain     Cervical disc  . IBS (irritable bowel syndrome)   . Esophageal stricture   . Diverticulosis   . Right leg DVT 1982    groin  . COPD (chronic obstructive pulmonary disease)   . Emphysema   . Pneumonia 2012; 04/27/2012  . Chronic bronchitis     "keep it all the time" (04/27/2012)  . Exertional dyspnea   . Type II diabetes mellitus     No meds since weight loss  . Migraine   . Epilepsy   . DJD (degenerative joint disease)   . Arthritis     "hands and legs and feet" (04/27/2012)  . Depression   . OSA on CPAP     6-7 yrs  . UTI  (urinary tract infection)     2 weeks ago  . Cancer 05    rt arm mole rem cancer  . Vertigo 10/24/2014    Social History   Social History  . Marital Status: Married    Spouse Name: [name]  . Number of Children: 2  . Years of Education: 12   Occupational History  . disabled    Social History Main Topics  . Smoking status: Current Every Day Smoker -- 1.00 packs/day for 36 years    Types: Cigarettes    Start date: 08/12/1975  . Smokeless tobacco: Former Systems developer    Quit date: 08/23/2012     Comment: started back smoking in 11/2012  . Alcohol Use: No  . Drug Use: No  . Sexual Activity: No   Other Topics Concern  . Not on file   Social History Narrative   Patient lives at home with husband [name], her son and his wife.    Patient has 2 children.    Patient has a high school education.    Patient is currently unemployed.    Patient is right handed.    Patient drinks 2 cups of caffeine per day.       Current Outpatient Prescriptions on File Prior to Visit  Medication Sig  . albuterol (  PROAIR HFA) 108 (90 BASE) MCG/ACT inhaler Inhale 2 puffs into the lungs every 6 (six) hours as needed. Shortness of breath  . albuterol (PROVENTIL) (2.5 MG/3ML) 0.083% nebulizer solution Take 3 mLs (2.5 mg total) by nebulization every 6 (six) hours as needed. For shortness of breath  . aspirin EC 81 MG tablet Take 1 tablet (81 mg total) by mouth daily.  . calcium citrate (CALCITRATE - DOSED IN MG ELEMENTAL CALCIUM) 950 MG tablet Take 1 tablet (200 mg of elemental calcium total) by mouth 2 (two) times daily.  . cholecalciferol (VITAMIN D) 400 UNITS TABS tablet Take 2 tablets (800 Units total) by mouth daily.  . ferrous sulfate 325 (65 FE) MG tablet Take 1 tablet (325 mg total) by mouth 2 (two) times daily with a meal.  . fluticasone (FLONASE) 50 MCG/ACT nasal spray Place 2 sprays into both nostrils daily as needed. For congestion.  Marland Kitchen guaiFENesin (MUCINEX) 600 MG 12 hr tablet Take 1 tablet (600 mg total)  by mouth 2 (two) times daily.  Marland Kitchen HYDROcodone-acetaminophen (NORCO/VICODIN) 5-325 MG per tablet Take 1 tablet by mouth 2 (two) times daily as needed for moderate pain.  Marland Kitchen lamoTRIgine (LAMICTAL) 200 MG tablet Take 1 tablet (200 mg total) by mouth 2 (two) times daily.  . Loratadine 10 MG CAPS Take 1 capsule (10 mg total) by mouth daily.  . mometasone-formoterol (DULERA) 200-5 MCG/ACT AERO Inhale 2 puffs into the lungs 2 (two) times daily.  Marland Kitchen omeprazole (PRILOSEC) 40 MG capsule Take 1 capsule (40 mg total) by mouth 2 (two) times daily.  . QUEtiapine (SEROQUEL) 50 MG tablet Take 50 mg by mouth at bedtime.  . sertraline (ZOLOFT) 50 MG tablet Take 1 tablet (50 mg total) by mouth daily.  . simvastatin (ZOCOR) 40 MG tablet Take 1 tablet (40 mg total) by mouth at bedtime.  Marland Kitchen tiotropium (SPIRIVA) 18 MCG inhalation capsule Place 1 capsule (18 mcg total) into inhaler and inhale daily.  Marland Kitchen topiramate (TOPAMAX) 100 MG tablet 1tab in the am, 2 at bedtime   No current facility-administered medications on file prior to visit.    Review of Systems  Constitutional: Negative for fever, chills, diaphoresis, activity change, appetite change and fatigue.  HENT: Negative for congestion and hearing loss.   Eyes: Negative for visual disturbance.  Respiratory: Negative for cough, chest tightness, shortness of breath and wheezing.   Cardiovascular: Positive for leg swelling (not active. some intermittent left mid-lower leg). Negative for chest pain and palpitations.  Gastrointestinal: Negative for nausea, vomiting, abdominal pain, diarrhea and constipation.  Musculoskeletal: Positive for joint swelling (left knee swelling around lower part of knee with occasional swelling into lower leg) and arthralgias (left knee pain, radiating into left lower leg). Negative for neck pain.  Skin: Negative for rash.       Denies any redness of lower calves  Neurological: Negative for dizziness, weakness, light-headedness, numbness and  headaches.  Hematological: Negative for adenopathy.   Per HPI unless specifically indicated above     Objective:    BP 117/78 mmHg  Pulse 80  Temp(Src) 97.7 F (36.5 C) (Oral)  Ht 5\' 1"  (1.549 m)  Wt 179 lb 14.4 oz (81.602 kg)  BMI 34.01 kg/m2  Wt Readings from Last 3 Encounters:  04/13/15 179 lb 14.4 oz (81.602 kg)  03/23/15 183 lb (83.008 kg)  02/08/15 182 lb 6.4 oz (82.736 kg)    Physical Exam  Constitutional: She is oriented to person, place, and time. She appears well-developed and well-nourished.  No distress.  Chronically ill appearing, has rolling walker due to chronic pain  HENT:  Head: Normocephalic and atraumatic.  Eyes: Conjunctivae are normal. Pupils are equal, round, and reactive to light.  Neck: Normal range of motion. Neck supple. No thyromegaly present.  Cardiovascular: Normal rate, regular rhythm, normal heart sounds and intact distal pulses.   No murmur heard. Pulmonary/Chest: Effort normal and breath sounds normal. No respiratory distress. She has no wheezes. She has no rales.  Musculoskeletal: She exhibits no edema.       Left knee: She exhibits decreased range of motion (due to pain), swelling (localized lower medial soft tissue swelling without effusion) and bony tenderness. She exhibits no effusion, no ecchymosis, no deformity, no laceration, no erythema and normal meniscus. Tenderness found. Medial joint line tenderness noted.  Lower extremities appear symmetrical. Calves non-tender, no erythema, negative Homan's sign.  Lymphadenopathy:    She has no cervical adenopathy.  Neurological: She is alert and oriented to person, place, and time.  Skin: Skin is warm and dry. No rash noted. She is not diaphoretic.  Nursing note and vitals reviewed.  Results for orders placed or performed in visit on 02/08/15  Comprehensive metabolic panel  Result Value Ref Range   Glucose 104 (H) 65 - 99 mg/dL   BUN 19 6 - 24 mg/dL   Creatinine, Ser 0.93 0.57 - 1.00 mg/dL    GFR calc non Af Amer 68 >59 mL/min/1.73   GFR calc Af Amer 79 >59 mL/min/1.73   BUN/Creatinine Ratio 20 9 - 23   Sodium 144 134 - 144 mmol/L   Potassium 4.0 3.5 - 5.2 mmol/L   Chloride 107 97 - 108 mmol/L   CO2 22 18 - 29 mmol/L   Calcium 8.9 8.7 - 10.2 mg/dL   Total Protein 6.7 6.0 - 8.5 g/dL   Albumin 3.9 3.5 - 5.5 g/dL   Globulin, Total 2.8 1.5 - 4.5 g/dL   Albumin/Globulin Ratio 1.4 1.1 - 2.5   Bilirubin Total <0.2 0.0 - 1.2 mg/dL   Alkaline Phosphatase 84 39 - 117 IU/L   AST 11 0 - 40 IU/L   ALT 6 0 - 32 IU/L  CBC with Differential/Platelet  Result Value Ref Range   WBC 9.5 3.4 - 10.8 x10E3/uL   RBC 3.90 3.77 - 5.28 x10E6/uL   Hemoglobin 11.9 11.1 - 15.9 g/dL   Hematocrit 35.6 34.0 - 46.6 %   MCV 91 79 - 97 fL   MCH 30.5 26.6 - 33.0 pg   MCHC 33.4 31.5 - 35.7 g/dL   RDW 16.1 (H) 12.3 - 15.4 %   Platelets 252 150 - 379 x10E3/uL   Neutrophils 58 %   Lymphs 32 %   Monocytes 7 %   Eos 3 %   Basos 0 %   Neutrophils Absolute 5.4 1.4 - 7.0 x10E3/uL   Lymphocytes Absolute 3.0 0.7 - 3.1 x10E3/uL   Monocytes Absolute 0.7 0.1 - 0.9 x10E3/uL   EOS (ABSOLUTE) 0.3 0.0 - 0.4 x10E3/uL   Basophils Absolute 0.0 0.0 - 0.2 x10E3/uL   Immature Granulocytes 0 %   Immature Grans (Abs) 0.0 0.0 - 0.1 x10E3/uL  Lamotrigine level  Result Value Ref Range   Lamotrigine Lvl 4.6 2.0 - 20.0 ug/mL      Procedure: LEFT Knee injection Verbal consent given, written consent signed and scanned into record. Medication:  1 cc Depo-medrol 40mg  and 4 cc Lidocaine 1% without epi Time Out taken  Landmarks identified. Area cleansed with alcohol  wipes.Using 21 gauge and 1, 1/2 inch needle, Left knee was injected (with above listed medication) via lateral approach, cold spray used for superficial anesthestic.Sterile bandage placed.Patient tolerated procedure well without bleeding or paresthesias.No complications.       Assessment & Plan:   Problem List Items Addressed This Visit       Musculoskeletal and Integument   DJD (degenerative joint disease) of knee - Primary    Consistent with acute on chronic OA flare in left knee with some localized soft tissue swelling medial joint space, but no acute knee effusion, erythema. No evidence of infection. 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 - Unlikely Left LE DVT with reported swelling (no acute symptoms of DVT today, known history 30 yr ago provoked DVT in RLE, but no current risk factors other than some limited mobility). - No longer on Norco (pt self discontinued), taking tylenol without relief, not on NSAID - Last steroid injection, L-knee 01/10/2015, lasted 1 month  Plan: 1. Repeat Left knee steroid injection - see procedure note for details, tolerated well, no complications 2. Start Mobic 15mg  daily x 1 month trial, last SCr 0.93 01/2015, no history of PUD 3. Continue Tylenol, topical conservative therapies, compression, RICE therapy 4. Follow-up with ortho as scheduled 5. RTC 1-2 weeks for re-evaluation, to make sure pain / swelling respond to injection / mobic, and reassurance no persistent LLE swelling or symptoms concerning for DVT, again clinically less likely. Strict return precautions given if worsening.      Relevant Medications   meloxicam (MOBIC) 15 MG tablet   methylPREDNISolone acetate (DEPO-MEDROL) injection 40 mg (Completed)      Meds ordered this encounter  Medications  . meloxicam (MOBIC) 15 MG tablet    Sig: Take 1 tablet (15 mg total) by mouth daily.    Dispense:  30 tablet    Refill:  1  . methylPREDNISolone acetate (DEPO-MEDROL) injection 40 mg    Sig:       Follow up plan: Return in about 1 week (around 04/20/2015) for left knee pain, arthritis, swelling.  Nobie Putnam, [place], PGY-3

## 2015-04-19 ENCOUNTER — Other Ambulatory Visit: Payer: Self-pay | Admitting: *Deleted

## 2015-04-22 MED ORDER — SIMVASTATIN 40 MG PO TABS: 40.0000 mg | ORAL_TABLET | Freq: Every day | ORAL | Status: DC

## 2015-04-23 ENCOUNTER — Ambulatory Visit: Payer: Medicaid Other | Admitting: Internal Medicine

## 2015-04-24 ENCOUNTER — Ambulatory Visit: Payer: Medicaid Other | Admitting: Family Medicine

## 2015-04-25 DIAGNOSIS — R51 Headache: Secondary | ICD-10-CM

## 2015-04-25 DIAGNOSIS — R519 Headache, unspecified: Secondary | ICD-10-CM | POA: Insufficient documentation

## 2015-04-25 DIAGNOSIS — J310 Chronic rhinitis: Secondary | ICD-10-CM | POA: Insufficient documentation

## 2015-04-25 DIAGNOSIS — J449 Chronic obstructive pulmonary disease, unspecified: Secondary | ICD-10-CM | POA: Insufficient documentation

## 2015-05-09 ENCOUNTER — Other Ambulatory Visit: Payer: Self-pay | Admitting: *Deleted

## 2015-05-09 DIAGNOSIS — F410 Panic disorder [episodic paroxysmal anxiety] without agoraphobia: Secondary | ICD-10-CM

## 2015-05-09 MED ORDER — SERTRALINE HCL 50 MG PO TABS: 50.0000 mg | ORAL_TABLET | Freq: Every day | ORAL | Status: DC

## 2015-05-09 MED ORDER — LORATADINE 10 MG PO CAPS: 10.0000 mg | ORAL_CAPSULE | Freq: Every day | ORAL | Status: DC

## 2015-05-18 ENCOUNTER — Encounter: Payer: Self-pay | Admitting: Family Medicine

## 2015-05-18 ENCOUNTER — Ambulatory Visit (INDEPENDENT_AMBULATORY_CARE_PROVIDER_SITE_OTHER): Payer: Medicaid Other | Admitting: Family Medicine

## 2015-05-18 VITALS — BP 106/58 | HR 74 | Temp 98.7°F | Ht 61.0 in | Wt 181.0 lb

## 2015-05-18 DIAGNOSIS — Z7189 Other specified counseling: Secondary | ICD-10-CM | POA: Diagnosis not present

## 2015-05-18 DIAGNOSIS — J441 Chronic obstructive pulmonary disease with (acute) exacerbation: Secondary | ICD-10-CM

## 2015-05-18 DIAGNOSIS — M1712 Unilateral primary osteoarthritis, left knee: Secondary | ICD-10-CM

## 2015-05-18 DIAGNOSIS — G8929 Other chronic pain: Secondary | ICD-10-CM | POA: Insufficient documentation

## 2015-05-18 MED ORDER — HYDROCODONE-ACETAMINOPHEN 5-325 MG PO TABS
1.0000 | ORAL_TABLET | Freq: Two times a day (BID) | ORAL | Status: DC | PRN
Start: 2015-05-18 — End: 2015-06-25

## 2015-05-18 MED ORDER — PREDNISONE 50 MG PO TABS: 50.0000 mg | ORAL_TABLET | Freq: Every day | ORAL | Status: DC

## 2015-05-18 NOTE — Assessment & Plan Note (Signed)
Consistent with mild acute exacerbation of COPD with worsening productive cough. Similar to prior exacerbations, last 01/2015. - No hypoxia (92% on RA), afebrile, no recent hospitalization - Continues Albuterol, Spiriva, Dulera  Plan: 1. Start Prednisone 50mg  x 5 day steroid burst 2. Use albuterol q 4 hr regularly x 2-3 days. Continue maintenance inhalers 3. Considered antibiotics, however afebrile and no hypoxia, would not be indicated in mild AECOPD, unless recurrent or not improved on steroids. Regardless, patient with very severe med allergies with anaphylaxis to most antibiotics (PCN, amoxicillin, azithromycin, levaquin, doxycycline) 4. RTC about 1 week if not improving, otherwise strict return criteria to go to ED

## 2015-05-18 NOTE — Assessment & Plan Note (Addendum)
See overview.   

## 2015-05-18 NOTE — Assessment & Plan Note (Addendum)
Persistent acute on chronic OA flare in left knee, had improved with recent L-knee steroid injection 04/13/15, lasted 2-3 weeks. Now pain returned. Currently uncomplicated without knee effusion, erythema or evidence of infection. 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 - Multiple treatment failures - on mobic with mild relief, tried tylenol, previously on norco but had self discontinued, now requesting again - [facility] Pain Contract signed 01/10/2015  Plan: 1. Refill Norco 5/325 BID PRN #[id number] refills x 1 month rx printed today 2. Check UDS today - expect opiates from Norco to be present, if consistent can provide long-term narcotics 3. Continue Mobic, Tylenol, compression, RICE therapy 4. Referral back to Orthopedics (Dr. Noemi Chapel) for Left knee OA 5. RTC 1 wk for COPD but can follow-up for L-knee anytime within 1 month 6. Future consider repeat steroid injections q 3 mo. Can provider longer course opiates if UDS consistent

## 2015-05-18 NOTE — Progress Notes (Signed)
Subjective:    Patient ID: [name], female    DOB: [birth year], 59 y.o.   MRN: [id number]  [name] is a 59 y.o. female presenting on 05/18/2015 for Knee Pain and Cough  HPI  LEFT KNEE PAIN / CHRONIC OSTEOARTHRITIS: - Last [facility] visit 04/13/15 for same complaint, Left Knee OA, which is chronic problem treated with multiple therapies including Tylenol, NSAIDs, steroid injections, synvisc, Orthopedics previuosly following with Dr. Noemi Chapel (s/p Right TKR, considered high risk for Left knee due to COPD and other co-morbidities) - Today she presents for follow-up after she had L-knee steroid injection on 04/13/15 with about 3 weeks of mild relief (only about 25% better reduced pain) then now returned to same level of pain. Additionally complains of Left knee "locking up", has not fallen. Has walker uses frequently but did not bring today. - Previously treated with chronic opiate narcotics, prescribed at [facility] with initiated Pain Contract on 01/10/2015. Requesting refill on this today since she essentially needs surgery but cannot have it done. Agreeable to UDS testing today, and admits that she took last Hydrocodone today (05/18/15) this AM from prior rx from 01/2015 - Last left knee x-ray 07/2014 with tricompartmental arthritis - Requesting referral back to Ortho  COPD EXACERBATION - Reports productive cough for about 1 month, initially started like a cold but then has been getting worse, denies any significant fevers, sweats, nausea / vomiting. This is similar to previous COPD exacerbations, last in 01/2015. She is usually treated with Prednisone bursts, prior history of improvement with antibiotics, however chart listed anaphylaxis allergy to most antibiotics, she is unable to recall which ones she has tolerated. - Does not use supplemental oxygen - Using Albuterol and Spiriva, Dulera  OSA on CPAP: - Reports has not been using CPAP for past few weeks due to cough / COPD, and has been feeling increasingly  tired during the day and falls asleep easily  Past Medical History  Diagnosis Date  . Allergic rhinitis   . Hypertension   . Hyperlipidemia   . GERD (gastroesophageal reflux disease)   . Glaucoma   . Neck pain     Cervical disc  . IBS (irritable bowel syndrome)   . Esophageal stricture   . Diverticulosis   . Right leg DVT ([place]) 1982    groin  . COPD (chronic obstructive pulmonary disease) ([place])   . Emphysema   . Pneumonia 2012; 04/27/2012  . Chronic bronchitis ([place])     "keep it all the time" (04/27/2012)  . Exertional dyspnea   . Type II diabetes mellitus ([place])     No meds since weight loss  . Migraine   . Epilepsy ([place])   . DJD (degenerative joint disease)   . Arthritis     "hands and legs and feet" (04/27/2012)  . Depression   . OSA on CPAP     6-7 yrs  . UTI (urinary tract infection)     2 weeks ago  . Cancer ([place]) 05    rt arm mole rem cancer  . Vertigo 10/24/2014    Social History   Social History  . Marital Status: Married    Spouse Name: [name]  . Number of Children: 2  . Years of Education: 12   Occupational History  . disabled    Social History Main Topics  . Smoking status: Current Every Day Smoker -- 1.00 packs/day for 36 years    Types: Cigarettes    Start date: 08/12/1975  .  Smokeless tobacco: Former Systems developer    Quit date: 08/23/2012     Comment: started back smoking in 11/2012  . Alcohol Use: No  . Drug Use: No  . Sexual Activity: No   Other Topics Concern  . Not on file   Social History Narrative   Patient lives at home with husband [name], her son and his wife.    Patient has 2 children.    Patient has a high school education.    Patient is currently unemployed.    Patient is right handed.    Patient drinks 2 cups of caffeine per day.       Current Outpatient Prescriptions on File Prior to Visit  Medication Sig  . albuterol (PROAIR HFA) 108 (90 BASE) MCG/ACT inhaler Inhale 2 puffs into the lungs every 6 (six) hours as needed. Shortness  of breath  . albuterol (PROVENTIL) (2.5 MG/3ML) 0.083% nebulizer solution Take 3 mLs (2.5 mg total) by nebulization every 6 (six) hours as needed. For shortness of breath  . aspirin EC 81 MG tablet Take 1 tablet (81 mg total) by mouth daily.  . calcium citrate (CALCITRATE - DOSED IN MG ELEMENTAL CALCIUM) 950 MG tablet Take 1 tablet (200 mg of elemental calcium total) by mouth 2 (two) times daily.  . cholecalciferol (VITAMIN D) 400 UNITS TABS tablet Take 2 tablets (800 Units total) by mouth daily.  . ferrous sulfate 325 (65 FE) MG tablet Take 1 tablet (325 mg total) by mouth 2 (two) times daily with a meal.  . fluticasone (FLONASE) 50 MCG/ACT nasal spray Place 2 sprays into both nostrils daily as needed. For congestion.  Marland Kitchen guaiFENesin (MUCINEX) 600 MG 12 hr tablet Take 1 tablet (600 mg total) by mouth 2 (two) times daily.  Marland Kitchen lamoTRIgine (LAMICTAL) 200 MG tablet Take 1 tablet (200 mg total) by mouth 2 (two) times daily.  . Loratadine 10 MG CAPS Take 1 capsule (10 mg total) by mouth daily.  . meloxicam (MOBIC) 15 MG tablet Take 1 tablet (15 mg total) by mouth daily.  . mometasone-formoterol (DULERA) 200-5 MCG/ACT AERO Inhale 2 puffs into the lungs 2 (two) times daily.  Marland Kitchen omeprazole (PRILOSEC) 40 MG capsule Take 1 capsule (40 mg total) by mouth 2 (two) times daily.  . QUEtiapine (SEROQUEL) 50 MG tablet Take 50 mg by mouth at bedtime.  . sertraline (ZOLOFT) 50 MG tablet Take 1 tablet (50 mg total) by mouth daily.  . simvastatin (ZOCOR) 40 MG tablet Take 1 tablet (40 mg total) by mouth at bedtime.  Marland Kitchen tiotropium (SPIRIVA) 18 MCG inhalation capsule Place 1 capsule (18 mcg total) into inhaler and inhale daily.  Marland Kitchen topiramate (TOPAMAX) 100 MG tablet 1tab in the am, 2 at bedtime   No current facility-administered medications on file prior to visit.    Review of Systems  Constitutional: Positive for fatigue (tired). Negative for fever, chills, diaphoresis, activity change and appetite change.  HENT:  Negative for congestion and hearing loss.   Eyes: Negative for visual disturbance.  Respiratory: Positive for cough (productive), shortness of breath and wheezing. Negative for chest tightness.   Cardiovascular: Negative for chest pain, palpitations and leg swelling.  Gastrointestinal: Negative for nausea, vomiting, abdominal pain, diarrhea and constipation.  Genitourinary: Negative for dysuria, frequency and hematuria.  Musculoskeletal: Positive for arthralgias (left knee). Negative for neck pain.  Skin: Negative for rash.  Neurological: Negative for dizziness, weakness, light-headedness, numbness and headaches.  Hematological: Negative for adenopathy.  Psychiatric/Behavioral: Negative for behavioral problems and  confusion.   Per HPI unless specifically indicated above     Objective:    BP 106/58 mmHg  Pulse 74  Temp(Src) 98.7 F (37.1 C) (Oral)  Ht 5\' 1"  (1.549 m)  Wt 181 lb (82.101 kg)  BMI 34.22 kg/m2  SpO2 92%  Wt Readings from Last 3 Encounters:  05/18/15 181 lb (82.101 kg)  04/13/15 179 lb 14.4 oz (81.602 kg)  03/23/15 183 lb (83.008 kg)    Physical Exam  Constitutional: She is oriented to person, place, and time. She appears well-developed and well-nourished. No distress.  Chronically ill appearing, comfortable and cooperative  HENT:  Head: Normocephalic and atraumatic.  Mouth/Throat: Oropharynx is clear and moist.  Eyes: Conjunctivae and EOM are normal. Pupils are equal, round, and reactive to light.  Neck: Normal range of motion. Neck supple.  Cardiovascular: Normal rate, regular rhythm, normal heart sounds and intact distal pulses.   No murmur heard. Pulmonary/Chest: Effort normal. No respiratory distress. She has wheezes (scattered exp wheezes). She has no rales.  Reduced air movement bilateral  Musculoskeletal: She exhibits no edema.       Left knee: She exhibits decreased range of motion and swelling. She exhibits no effusion, no deformity, no LCL laxity, no  bony tenderness and no MCL laxity. Tenderness found. Medial joint line tenderness noted. No lateral joint line, no MCL and no LCL tenderness noted.  Lymphadenopathy:    She has no cervical adenopathy.  Neurological: She is alert and oriented to person, place, and time.  Skin: Skin is warm and dry. No rash noted. She is not diaphoretic.  Nursing note and vitals reviewed.  Results for orders placed or performed in visit on 02/08/15  Comprehensive metabolic panel  Result Value Ref Range   Glucose 104 (H) 65 - 99 mg/dL   BUN 19 6 - 24 mg/dL   Creatinine, Ser 0.93 0.57 - 1.00 mg/dL   GFR calc non Af Amer 68 >59 mL/min/1.73   GFR calc Af Amer 79 >59 mL/min/1.73   BUN/Creatinine Ratio 20 9 - 23   Sodium 144 134 - 144 mmol/L   Potassium 4.0 3.5 - 5.2 mmol/L   Chloride 107 97 - 108 mmol/L   CO2 22 18 - 29 mmol/L   Calcium 8.9 8.7 - 10.2 mg/dL   Total Protein 6.7 6.0 - 8.5 g/dL   Albumin 3.9 3.5 - 5.5 g/dL   Globulin, Total 2.8 1.5 - 4.5 g/dL   Albumin/Globulin Ratio 1.4 1.1 - 2.5   Bilirubin Total <0.2 0.0 - 1.2 mg/dL   Alkaline Phosphatase 84 39 - 117 IU/L   AST 11 0 - 40 IU/L   ALT 6 0 - 32 IU/L  CBC with Differential/Platelet  Result Value Ref Range   WBC 9.5 3.4 - 10.8 x10E3/uL   RBC 3.90 3.77 - 5.28 x10E6/uL   Hemoglobin 11.9 11.1 - 15.9 g/dL   Hematocrit 35.6 34.0 - 46.6 %   MCV 91 79 - 97 fL   MCH 30.5 26.6 - 33.0 pg   MCHC 33.4 31.5 - 35.7 g/dL   RDW 16.1 (H) 12.3 - 15.4 %   Platelets 252 150 - 379 x10E3/uL   Neutrophils 58 %   Lymphs 32 %   Monocytes 7 %   Eos 3 %   Basos 0 %   Neutrophils Absolute 5.4 1.4 - 7.0 x10E3/uL   Lymphocytes Absolute 3.0 0.7 - 3.1 x10E3/uL   Monocytes Absolute 0.7 0.1 - 0.9 x10E3/uL   EOS (ABSOLUTE) 0.3  0.0 - 0.4 x10E3/uL   Basophils Absolute 0.0 0.0 - 0.2 x10E3/uL   Immature Granulocytes 0 %   Immature Grans (Abs) 0.0 0.0 - 0.1 x10E3/uL  Lamotrigine level  Result Value Ref Range   Lamotrigine Lvl 4.6 2.0 - 20.0 ug/mL      Assessment  & Plan:   Problem List Items Addressed This Visit      Respiratory   Chronic obstructive pulmonary disease with acute exacerbation ([place]) - Primary    Consistent with mild acute exacerbation of COPD with worsening productive cough. Similar to prior exacerbations, last 01/2015. - No hypoxia (92% on RA), afebrile, no recent hospitalization - Continues Albuterol, Spiriva, Dulera  Plan: 1. Start Prednisone 50mg  x 5 day steroid burst 2. Use albuterol q 4 hr regularly x 2-3 days. Continue maintenance inhalers 3. Considered antibiotics, however afebrile and no hypoxia, would not be indicated in mild AECOPD, unless recurrent or not improved on steroids. Regardless, patient with very severe med allergies with anaphylaxis to most antibiotics (PCN, amoxicillin, azithromycin, levaquin, doxycycline) 4. RTC about 1 week if not improving, otherwise strict return criteria to go to ED       Relevant Medications   predniSONE (DELTASONE) 50 MG tablet     Musculoskeletal and Integument   DJD (degenerative joint disease) of knee    Persistent acute on chronic OA flare in left knee, had improved with recent L-knee steroid injection 04/13/15, lasted 2-3 weeks. Now pain returned. Currently uncomplicated without knee effusion, erythema or evidence of infection. 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 - Multiple treatment failures - on mobic with mild relief, tried tylenol, previously on norco but had self discontinued, now requesting again - [facility] Pain Contract signed 01/10/2015  Plan: 1. Refill Norco 5/325 BID PRN #[id number] refills x 1 month rx printed today 2. Check UDS today - expect opiates from Norco to be present, if consistent can provide long-term narcotics 3. Continue Mobic, Tylenol, compression, RICE therapy 4. Referral back to Orthopedics (Dr. Noemi Chapel) for Left knee OA 5. RTC 1 wk  for COPD but can follow-up for L-knee anytime within 1 month 6. Future consider repeat steroid injections q 3 mo. Can provider longer course opiates if UDS consistent      Relevant Medications   HYDROcodone-acetaminophen (NORCO/VICODIN) 5-325 MG tablet   predniSONE (DELTASONE) 50 MG tablet   Other Relevant Orders   Drug Scr Ur, Pain Mgmt, Reflex Conf   Ambulatory referral to Orthopedic Surgery     Other   Encounter for chronic pain management    See overview       Relevant Medications   HYDROcodone-acetaminophen (NORCO/VICODIN) 5-325 MG tablet   Other Relevant Orders   Drug Scr Ur, Pain Mgmt, Reflex Conf      Meds ordered this encounter  Medications  . HYDROcodone-acetaminophen (NORCO/VICODIN) 5-325 MG tablet    Sig: Take 1 tablet by mouth 2 (two) times daily as needed for moderate pain.    Dispense:  45 tablet    Refill:  0  . predniSONE (DELTASONE) 50 MG tablet    Sig: Take 1 tablet (50 mg total) by mouth daily with breakfast.    Dispense:  5 tablet    Refill:  0  . pantoprazole (PROTONIX) 40 MG tablet    Sig:     Refill:  5      Follow  up plan: Return in about 1 week (around 05/25/2015) for COPD exac.  A total of 25 minutes was spent face-to-face with this patient. Over half this time was spent on counseling patient on the diagnosis and different diagnostic and therapeutic options available.  Nobie Putnam, [place], PGY-3

## 2015-05-18 NOTE — Patient Instructions (Signed)
Dear [name], Thank you for coming in to clinic today.  1. You likely have a COPD Exacerbation - treat with Steroids, prednisone 50mg  take one tab daily for 5 days with breakfast, finish all 5 tabs. No antibiotics due to allergies. Use Albuterol every 4 hours for next 2-3 days, then as needed, keep using Spiriva and Dulera.  2. Left Knee Arthritis - No injection today as it has only been 1 month. Referral to Orthopedics Dr. Noemi Chapel, you should get called with this appointment - Continue Tylenol as needed  4. Elevate and rest leg, use compression sleeve, ice for swelling, heat for pain  If breathing significantly worsens, chest pain or worsening shortness of breath, you may go to ED for emergency treatment.  Please schedule a follow-up appointment with Dr. Ola Spurr or myself to followup in 1-2 weeks for COPD Follow-up  If you have any other questions or concerns, please feel free to call the clinic to contact me. You may also schedule an earlier appointment if necessary.  However, if your symptoms get significantly worse, please go to the Emergency Department to seek immediate medical attention.  Nobie Putnam, [place]

## 2015-05-19 LAB — DRUG SCR UR, PAIN MGMT, REFLEX CONF
AMPHETAMINE SCRN UR: NEGATIVE
BARBITURATE QUANT UR: NEGATIVE
BENZODIAZEPINES.: NEGATIVE
Cocaine Metabolites: NEGATIVE
Creatinine,U: 214.56 mg/dL
METHADONE: NEGATIVE
Marijuana Metabolite: NEGATIVE
Opiates: NEGATIVE
Phencyclidine (PCP): NEGATIVE
Propoxyphene: NEGATIVE

## 2015-05-23 ENCOUNTER — Ambulatory Visit (INDEPENDENT_AMBULATORY_CARE_PROVIDER_SITE_OTHER): Payer: Medicaid Other | Admitting: Neurology

## 2015-05-23 ENCOUNTER — Encounter: Payer: Self-pay | Admitting: Neurology

## 2015-05-23 VITALS — BP 102/70 | HR 72 | Ht 61.0 in | Wt 181.5 lb

## 2015-05-23 DIAGNOSIS — G43019 Migraine without aura, intractable, without status migrainosus: Secondary | ICD-10-CM | POA: Diagnosis not present

## 2015-05-23 DIAGNOSIS — R05 Cough: Secondary | ICD-10-CM

## 2015-05-23 DIAGNOSIS — R054 Cough syncope: Secondary | ICD-10-CM

## 2015-05-23 DIAGNOSIS — G43711 Chronic migraine without aura, intractable, with status migrainosus: Secondary | ICD-10-CM | POA: Diagnosis not present

## 2015-05-23 DIAGNOSIS — R569 Unspecified convulsions: Secondary | ICD-10-CM | POA: Diagnosis not present

## 2015-05-23 NOTE — Patient Instructions (Signed)
  We will get you set up for Botox injections for the headache.  Migraine Headache A migraine headache is an intense, throbbing pain on one or both sides of your head. A migraine can last for 30 minutes to several hours. CAUSES  The exact cause of a migraine headache is not always known. However, a migraine may be caused when nerves in the brain become irritated and release chemicals that cause inflammation. This causes pain. Certain things may also trigger migraines, such as:  Alcohol.  Smoking.  Stress.  Menstruation.  Aged cheeses.  Foods or drinks that contain nitrates, glutamate, aspartame, or tyramine.  Lack of sleep.  Chocolate.  Caffeine.  Hunger.  Physical exertion.  Fatigue.  Medicines used to treat chest pain (nitroglycerine), birth control pills, estrogen, and some blood pressure medicines. SIGNS AND SYMPTOMS  Pain on one or both sides of your head.  Pulsating or throbbing pain.  Severe pain that prevents daily activities.  Pain that is aggravated by any physical activity.  Nausea, vomiting, or both.  Dizziness.  Pain with exposure to bright lights, loud noises, or activity.  General sensitivity to bright lights, loud noises, or smells. Before you get a migraine, you may get warning signs that a migraine is coming (aura). An aura may include:  Seeing flashing lights.  Seeing bright spots, halos, or zigzag lines.  Having tunnel vision or blurred vision.  Having feelings of numbness or tingling.  Having trouble talking.  Having muscle weakness. DIAGNOSIS  A migraine headache is often diagnosed based on:  Symptoms.  Physical exam.  A CT scan or MRI of your head. These imaging tests cannot diagnose migraines, but they can help rule out other causes of headaches. TREATMENT Medicines may be given for pain and nausea. Medicines can also be given to help prevent recurrent migraines.  HOME CARE INSTRUCTIONS  Only take over-the-counter or  prescription medicines for pain or discomfort as directed by your health care provider. The use of long-term narcotics is not recommended.  Lie down in a dark, quiet room when you have a migraine.  Keep a journal to find out what may trigger your migraine headaches. For example, write down:  What you eat and drink.  How much sleep you get.  Any change to your diet or medicines.  Limit alcohol consumption.  Quit smoking if you smoke.  Get 7-9 hours of sleep, or as recommended by your health care provider.  Limit stress.  Keep lights dim if bright lights bother you and make your migraines worse. SEEK IMMEDIATE MEDICAL CARE IF:   Your migraine becomes severe.  You have a fever.  You have a stiff neck.  You have vision loss.  You have muscular weakness or loss of muscle control.  You start losing your balance or have trouble walking.  You feel faint or pass out.  You have severe symptoms that are different from your first symptoms. MAKE SURE YOU:   Understand these instructions.  Will watch your condition.  Will get help right away if you are not doing well or get worse.   This information is not intended to replace advice given to you by your health care provider. Make sure you discuss any questions you have with your health care provider.   Document Released: 07/28/2005 Document Revised: 08/18/2014 Document Reviewed: 04/04/2013 Elsevier Interactive Patient Education [employer].

## 2015-05-23 NOTE — Progress Notes (Signed)
Reason for visit: Seizures, headache  [name] is an 59 y.o. female  History of present illness:  [name] is a 59 year old right-handed white female with a history of intractable epilepsy, and intractable headache. The patient is on lamotrigine and Topamax without full control the seizures, the patient was sent for video EEG monitoring study at [facility], and the patient did not have any seizure episodes during the monitoring period. The patient has not had any seizures since the video EEG, this was done around 4 weeks ago. The patient is not operating a motor vehicle. She still has frequent headaches, she may have 7 headache free days a month only. The headaches when they do come on last all day long. The patient has been tried on a multitude of medications including lamotrigine, Topamax, nortriptyline, Seroquel, hydrocodone, Advil, and Tylenol. The patient cannot take triptan medications. She may have some nausea, usually not vomiting with the headache. The headaches are impacting her ability to function during the day. The patient returns to the office today for an evaluation.  Past Medical History  Diagnosis Date  . Allergic rhinitis   . Hypertension   . Hyperlipidemia   . GERD (gastroesophageal reflux disease)   . Glaucoma   . Neck pain     Cervical disc  . IBS (irritable bowel syndrome)   . Esophageal stricture   . Diverticulosis   . Right leg DVT ([place]) 1982    groin  . COPD (chronic obstructive pulmonary disease) ([place])   . Emphysema   . Pneumonia 2012; 04/27/2012  . Chronic bronchitis ([place])     "keep it all the time" (04/27/2012)  . Exertional dyspnea   . Type II diabetes mellitus ([place])     No meds since weight loss  . Migraine   . Epilepsy ([place])   . DJD (degenerative joint disease)   . Arthritis     "hands and legs and feet" (04/27/2012)  . Depression   . OSA on CPAP     6-7 yrs  . UTI (urinary tract infection)     2 weeks ago  . Cancer ([place]) 05    rt arm mole rem  cancer  . Vertigo 10/24/2014    Past Surgical History  Procedure Laterality Date  . Tubal ligation  2001  . Knee arthroscopy  1990's    left  . Carpal tunnel release  ~ 2008    right  . Shoulder open rotator cuff repair  ~ 2010    left  . Abdominal hysterectomy  2001    "partial"  . 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 Total knee arthroplasty  08/23/2012    Procedure: TOTAL KNEE ARTHROPLASTY;  Surgeon: Lorn Junes, MD;  Location: [facility];  Service: Orthopedics;  Laterality: Right;  RIGHT KNEE ARTHROPLASTY MEDIAL AND LATERAL COMPARTMENTS WITH PATELLA RESURFACING  . Cataract extraction Bilateral     Family History  Problem Relation Age of Onset  . Heart disease Maternal Grandfather   . Diabetes Maternal Grandfather   . Heart disease      uncle  . Diabetes      uncles/aunts  . Breast cancer Maternal Aunt   . Alcohol abuse Father   . Lung cancer Father     was a smoker  . Emphysema Father     was a smoker  . Bipolar disorder Son   . Muscular dystrophy Son   . Heart disease Sister   .  Colon cancer Neg Hx   . Lung cancer Mother     was a smoker  . Stroke Mother   . Other Sister     blood clotting disorder  . Heart disease Son   . Seizures Brother     Social history:  reports that she has been smoking Cigarettes.  She started smoking about 39 years ago. She has a 36 pack-year smoking history. She quit smokeless tobacco use about 2 years ago. She reports that she does not drink alcohol or use illicit drugs.    Allergies  Allergen Reactions  . Amoxicillin Anaphylaxis    Breakout, mouth swells  . Azithromycin Swelling and Rash    "swelling all over, high, thrush"  . Doxycycline Hyclate Anaphylaxis and Rash    swelling  . Levofloxacin Anaphylaxis  . Penicillins Shortness Of Breath and Rash    "almost died, ended up in hospital"  . Sulfonamide Derivatives Shortness Of Breath, Swelling and Rash    "break out from head to toe"  . Chantix [Varenicline]  Other (See Comments)    "bad dreams, difficulty breathing"  . Clarithromycin Hives    swelling  . Nortriptyline Other (See Comments)    Per patient had "kidney problems" on this medication, could not use bathroom (urinary retention), swelling and breakout  . Codeine Nausea And Vomiting  . Fluticasone-Salmeterol Other (See Comments)    REACTION: ulcers in mouth    Medications:  Prior to Admission medications   Medication Sig Start Date End Date Taking? Authorizing Provider  albuterol (PROAIR HFA) 108 (90 BASE) MCG/ACT inhaler Inhale 2 puffs into the lungs every 6 (six) hours as needed. Shortness of breath 04/10/15  Yes [name], MD  albuterol (PROVENTIL) (2.5 MG/3ML) 0.083% nebulizer solution Take 3 mLs (2.5 mg total) by nebulization every 6 (six) hours as needed. For shortness of breath 04/10/15  Yes [name], MD  aspirin EC 81 MG tablet Take 1 tablet (81 mg total) by mouth daily. 10/04/12  Yes Leeanne Rio, MD  calcium citrate (CALCITRATE - DOSED IN MG ELEMENTAL CALCIUM) 950 MG tablet Take 1 tablet (200 mg of elemental calcium total) by mouth 2 (two) times daily. 04/10/15  Yes Hillary Corinda Gubler, MD  cholecalciferol (VITAMIN D) 400 UNITS TABS tablet Take 2 tablets (800 Units total) by mouth daily. 04/10/15  Yes Hillary Corinda Gubler, MD  ferrous sulfate 325 (65 FE) MG tablet Take 1 tablet (325 mg total) by mouth 2 (two) times daily with a meal. 09/06/12  Yes Donita Brooks, NP  fluticasone (FLONASE) 50 MCG/ACT nasal spray Place 2 sprays into both nostrils daily as needed. For congestion. 04/10/15  Yes Hillary Corinda Gubler, MD  guaiFENesin (MUCINEX) 600 MG 12 hr tablet Take 1 tablet (600 mg total) by mouth 2 (two) times daily. 03/21/14  Yes Renee A Kuneff, DO  HYDROcodone-acetaminophen (NORCO/VICODIN) 5-325 MG tablet Take 1 tablet by mouth 2 (two) times daily as needed for moderate pain. 05/18/15  Yes Alexander Devin Going, DO  lamoTRIgine (LAMICTAL) 200  MG tablet Take 1 tablet (200 mg total) by mouth 2 (two) times daily. 04/10/15  Yes Kathrynn Ducking, MD  Loratadine 10 MG CAPS Take 1 capsule (10 mg total) by mouth daily. 05/09/15  Yes Hillary Corinda Gubler, MD  meloxicam (MOBIC) 15 MG tablet Take 1 tablet (15 mg total) by mouth daily. 04/13/15  Yes Alexander J Karamalegos, DO  mometasone-formoterol (DULERA) 200-5 MCG/ACT AERO Inhale 2 puffs into the lungs 2 (two)  times daily. 04/10/15  Yes Hillary Corinda Gubler, MD  omeprazole (PRILOSEC) 40 MG capsule Take 1 capsule (40 mg total) by mouth 2 (two) times daily. 07/19/14  Yes Leeanne Rio, MD  pantoprazole (PROTONIX) 40 MG tablet  04/28/15  Yes Historical Provider, MD  predniSONE (DELTASONE) 50 MG tablet Take 1 tablet (50 mg total) by mouth daily with breakfast. 05/18/15  Yes Olin Hauser, DO  QUEtiapine (SEROQUEL) 50 MG tablet Take 50 mg by mouth at bedtime.   Yes Historical Provider, MD  sertraline (ZOLOFT) 50 MG tablet Take 1 tablet (50 mg total) by mouth daily. 05/09/15  Yes Hillary Corinda Gubler, MD  simvastatin (ZOCOR) 40 MG tablet Take 1 tablet (40 mg total) by mouth at bedtime. 04/22/15  Yes Asiyah Cletis Media, MD  tiotropium (SPIRIVA) 18 MCG inhalation capsule Place 1 capsule (18 mcg total) into inhaler and inhale daily. 04/10/15  Yes Hillary Corinda Gubler, MD  topiramate (TOPAMAX) 100 MG tablet 1tab in the am, 2 at bedtime 02/09/15  Yes Kathrynn Ducking, MD    ROS:  Out of a complete 14 system review of symptoms, the patient complains only of the following symptoms, and all other reviewed systems are negative.  Cough, wheezing, shortness of breath Leg swelling Sleep apnea, daytime sleepiness, snoring Joint pain, joint swelling, back pain, achy muscles, muscle cramps, walking difficulty, neck pain Dizziness, headache, speech difficulty, weakness  Blood pressure 102/70, pulse 72, height 5\' 1"  (1.549 m), weight 181 lb 8 oz (82.328 kg).  Physical Exam  General: The  patient is alert and cooperative at the time of the examination. The patient is moderately obese.  Skin: No significant peripheral edema is noted.   Neurologic Exam  Mental status: The patient is alert and oriented x 3 at the time of the examination. The patient has apparent normal recent and remote memory, with an apparently normal attention span and concentration ability.   Cranial nerves: Facial symmetry is present. Speech is normal, no aphasia or dysarthria is noted. Extraocular movements are full. Visual fields are full.  Motor: The patient has good strength in all 4 extremities.  Sensory examination: Soft touch sensation is symmetric on the face, arms, and legs.  Coordination: The patient has good finger-nose-finger and heel-to-shin bilaterally.  Gait and station: The patient has a slightly wide-based gait, the patient uses a walker for ambulation. Tandem gait was not attempted. Romberg is negative. No drift is seen.  Reflexes: Deep tendon reflexes are symmetric.   Assessment/Plan:  1. Intractable epilepsy  2. Intractable migraine headache  The patient will continue on the Lamictal and the Topamax, she will be set up for Botox injections to help the headache. The headaches have been present for number of years, the patient has never been fully controlled. The patient will follow-up in 4 months, possibly sooner if the Botox treatment is approved.  Jill Alexanders MD 05/23/2015 4:39 PM  [facility] [address] [place], [place] [address]  Phone [phone] Fax [phone]

## 2015-05-31 ENCOUNTER — Ambulatory Visit: Payer: Medicaid Other | Admitting: Family Medicine

## 2015-06-18 ENCOUNTER — Other Ambulatory Visit: Payer: Self-pay

## 2015-06-18 MED ORDER — QUETIAPINE FUMARATE 50 MG PO TABS: 50.0000 mg | ORAL_TABLET | Freq: Every day | ORAL | Status: DC

## 2015-06-22 ENCOUNTER — Ambulatory Visit: Payer: Medicaid Other | Admitting: Family Medicine

## 2015-06-25 ENCOUNTER — Ambulatory Visit: Payer: Medicaid Other | Admitting: Family Medicine

## 2015-06-25 ENCOUNTER — Ambulatory Visit (INDEPENDENT_AMBULATORY_CARE_PROVIDER_SITE_OTHER): Payer: Medicaid Other | Admitting: Family Medicine

## 2015-06-25 ENCOUNTER — Encounter: Payer: Self-pay | Admitting: Family Medicine

## 2015-06-25 VITALS — BP 122/73 | HR 81 | Temp 97.6°F | Ht 61.0 in | Wt 175.0 lb

## 2015-06-25 DIAGNOSIS — Z720 Tobacco use: Secondary | ICD-10-CM | POA: Diagnosis not present

## 2015-06-25 DIAGNOSIS — Z7189 Other specified counseling: Secondary | ICD-10-CM

## 2015-06-25 DIAGNOSIS — Z23 Encounter for immunization: Secondary | ICD-10-CM | POA: Diagnosis not present

## 2015-06-25 DIAGNOSIS — G8929 Other chronic pain: Secondary | ICD-10-CM

## 2015-06-25 DIAGNOSIS — J42 Unspecified chronic bronchitis: Secondary | ICD-10-CM | POA: Diagnosis not present

## 2015-06-25 DIAGNOSIS — M1712 Unilateral primary osteoarthritis, left knee: Secondary | ICD-10-CM | POA: Diagnosis not present

## 2015-06-25 MED ORDER — PREDNISONE 20 MG PO TABS: ORAL_TABLET | ORAL | Status: DC

## 2015-06-25 MED ORDER — HYDROCODONE-ACETAMINOPHEN 5-325 MG PO TABS: 1.0000 | ORAL_TABLET | Freq: Two times a day (BID) | ORAL | Status: DC | PRN

## 2015-06-25 NOTE — Assessment & Plan Note (Signed)
This is the crux of her pulmonary issues. Continues to smoke despite her poor respiratory status. Heavily emphasized importance of quitting smoking today. Patient understands that the cigarettes are literally killing her. She will follow up with Dr. Valentina Lucks to discuss smoking cessation techniques and evaluate whether she'd be a candidate for pharmacologic therapy.

## 2015-06-25 NOTE — Patient Instructions (Signed)
Please make quitting smoking your biggest health related priority This is very important! Schedule a visit with Dr. Valentina Lucks to discuss smoking cessation techniques  I am referring you to a pulmonologist for your COPD. You will get a phone call to schedule this appointment.   Refilled pain medicine  Take prednisone taper as follows: 60mg  (3 pills daily) for 2 days Then 50mg  (2.5 pills daily) Then 40mg  (2 pills daily) for 2 days Then 30mg  (1.5 pills daily) for 2 days Then 20mg  (1 pill daily) for 2 days Then 10mg  (0.5 pill daily) for 2 days Then stop  Be well, Dr. Ardelia Mems   Smoking Cessation, Tips for Success If you are ready to quit smoking, congratulations! You have chosen to help yourself be healthier. Cigarettes bring nicotine, tar, carbon monoxide, and other irritants into your body. Your lungs, heart, and blood vessels will be able to work better without these poisons. There are many different ways to quit smoking. Nicotine gum, nicotine patches, a nicotine inhaler, or nicotine nasal spray can help with physical craving. Hypnosis, support groups, and medicines help break the habit of smoking. WHAT THINGS CAN I DO TO MAKE QUITTING EASIER?  Here are some tips to help you quit for good:  Pick a date when you will quit smoking completely. Tell all of your friends and family about your plan to quit on that date.  Do not try to slowly cut down on the number of cigarettes you are smoking. Pick a quit date and quit smoking completely starting on that day.  Throw away all cigarettes.   Clean and remove all ashtrays from your home, work, and car.  On a card, write down your reasons for quitting. Carry the card with you and read it when you get the urge to smoke.  Cleanse your body of nicotine. Drink enough water and fluids to keep your urine clear or pale yellow. Do this after quitting to flush the nicotine from your body.  Learn to predict your moods. Do not let a bad situation be your  excuse to have a cigarette. Some situations in your life might tempt you into wanting a cigarette.  Never have "just one" cigarette. It leads to wanting another and another. Remind yourself of your decision to quit.  Change habits associated with smoking. If you smoked while driving or when feeling stressed, try other activities to replace smoking. Stand up when drinking your coffee. Brush your teeth after eating. Sit in a different chair when you read the paper. Avoid alcohol while trying to quit, and try to drink fewer caffeinated beverages. Alcohol and caffeine may urge you to smoke.  Avoid foods and drinks that can trigger a desire to smoke, such as sugary or spicy foods and alcohol.  Ask people who smoke not to smoke around you.  Have something planned to do right after eating or having a cup of coffee. For example, plan to take a walk or exercise.  Try a relaxation exercise to calm you down and decrease your stress. Remember, you may be tense and nervous for the first 2 weeks after you quit, but this will pass.  Find new activities to keep your hands busy. Play with a pen, coin, or rubber band. Doodle or draw things on paper.  Brush your teeth right after eating. This will help cut down on the craving for the taste of tobacco after meals. You can also try mouthwash.   Use oral substitutes in place of cigarettes. Try using  lemon drops, carrots, cinnamon sticks, or chewing gum. Keep them handy so they are available when you have the urge to smoke.  When you have the urge to smoke, try deep breathing.  Designate your home as a nonsmoking area.  If you are a heavy smoker, ask your health care provider about a prescription for nicotine chewing gum. It can ease your withdrawal from nicotine.  Reward yourself. Set aside the cigarette money you save and buy yourself something nice.  Look for support from others. Join a support group or smoking cessation program. Ask someone at home or at  work to help you with your plan to quit smoking.  Always ask yourself, "Do I need this cigarette or is this just a reflex?" Tell yourself, "Today, I choose not to smoke," or "I do not want to smoke." You are reminding yourself of your decision to quit.  Do not replace cigarette smoking with electronic cigarettes (commonly called e-cigarettes). The safety of e-cigarettes is unknown, and some may contain harmful chemicals.  If you relapse, do not give up! Plan ahead and think about what you will do the next time you get the urge to smoke. HOW WILL I FEEL WHEN I QUIT SMOKING? You may have symptoms of withdrawal because your body is used to nicotine (the addictive substance in cigarettes). You may crave cigarettes, be irritable, feel very hungry, cough often, get headaches, or have difficulty concentrating. The withdrawal symptoms are only temporary. They are strongest when you first quit but will go away within 10-14 days. When withdrawal symptoms occur, stay in control. Think about your reasons for quitting. Remind yourself that these are signs that your body is healing and getting used to being without cigarettes. Remember that withdrawal symptoms are easier to treat than the major diseases that smoking can cause.  Even after the withdrawal is over, expect periodic urges to smoke. However, these cravings are generally short lived and will go away whether you smoke or not. Do not smoke! WHAT RESOURCES ARE AVAILABLE TO HELP ME QUIT SMOKING? Your health care provider can direct you to community resources or hospitals for support, which may include:  Group support.  Education.  Hypnosis.  Therapy.   This information is not intended to replace advice given to you by your health care provider. Make sure you discuss any questions you have with your health care provider.   Document Released: 04/25/2004 Document Revised: 08/18/2014 Document Reviewed: 01/13/2013 Elsevier Interactive Patient Education  [employer].

## 2015-06-25 NOTE — Assessment & Plan Note (Signed)
Poorly controlled as evidenced by chronic sputum production and requiring albuterol 3x/day. Not hypoxic today. Long discussion that pulmonary care is limited as long as she continues to smoke. She is unfortunately allergic to just about every respiratory antibiotic we have available: has documented anaphylactic allergy to penicillins, fluoroquinolones, doxycycline, macrolides, and sulfa drugs. Counseled on limitations her allergy list places on her care. Will proceed with 12 day steroid taper and refer to pulmonology.

## 2015-06-25 NOTE — Progress Notes (Signed)
Date of Visit: 06/25/2015   HPI:  COPD - continues to smoke daily. Reports ongoing issues with chronic bronchitis. Has cough with sputum production every day for a few months. No fevers. Given several courses of prednisone, without much long term relief. Helps her breathing while she's on it. Using albuterol nebulizer about 3 times per day. Takes spiriva daily, as well as flovent twice daily. Has not seen pulmonology in over 2 years. Asks if she can have an antibiotic.  L Knee pain - needs refill on pain medicine. Was taking it about twice per day before running out. It helped her a lot. Has been getting weekly injections done at the orthopedic office. Needs knee replacement but is not a surgical candidate due to comorbidities.  ROS: See HPI.  [place]: history of vit d deficiency, hyperlipidemia, glaucoma, COPD, gerd, diastolic CHF, obesity, chronic tobacco abuse  PHYSICAL EXAM: BP 122/73 mmHg  Pulse 81  Temp(Src) 97.6 F (36.4 C) (Oral)  Ht 5\' 1"  (1.549 m)  Wt 175 lb (79.379 kg)  BMI 33.08 kg/m2  SpO2 95% Gen: NAD, pleasant, cooperative HEENT: normocephalic, atraumatic  Heart: regular rate and rhythm no murmur Lungs: end expiratory wheezes throughout. Limited air movement but no crackles. No respiratory distress, normal effort, speaks in full sentences without distress.  Neuro: alert, grossly nonfocal, speech normal Ext: L knee with crepitus with flexion & extension. No effusion or warmth.  ASSESSMENT/PLAN:  Health maintenance:  -flu shot given today   Tobacco abuse This is the crux of her pulmonary issues. Continues to smoke despite her poor respiratory status. Heavily emphasized importance of quitting smoking today. Patient understands that the cigarettes are literally killing her. She will follow up with Dr. Valentina Lucks to discuss smoking cessation techniques and evaluate whether she'd be a candidate for pharmacologic therapy.  Chronic obstructive pulmonary disease (COPD) Poorly  controlled as evidenced by chronic sputum production and requiring albuterol 3x/day. Not hypoxic today. Long discussion that pulmonary care is limited as long as she continues to smoke. She is unfortunately allergic to just about every respiratory antibiotic we have available: has documented anaphylactic allergy to penicillins, fluoroquinolones, doxycycline, macrolides, and sulfa drugs. Counseled on limitations her allergy list places on her care. Will proceed with 12 day steroid taper and refer to pulmonology.   Encounter for chronic pain management Refill norco today. Follow up with PCP in 1 month.   FOLLOW UP: F/u in 1 month with PCP for chronic medical issues Schedule visit with Dr. Valentina Lucks Referring to pulmonology  Delorse Limber. Ardelia Mems, [place]

## 2015-06-25 NOTE — Assessment & Plan Note (Signed)
Refill norco today. Follow up with PCP in 1 month.

## 2015-07-04 ENCOUNTER — Other Ambulatory Visit: Payer: Self-pay | Admitting: *Deleted

## 2015-07-04 DIAGNOSIS — F410 Panic disorder [episodic paroxysmal anxiety] without agoraphobia: Secondary | ICD-10-CM

## 2015-07-04 MED ORDER — SERTRALINE HCL 50 MG PO TABS: 50.0000 mg | ORAL_TABLET | Freq: Every day | ORAL | Status: DC

## 2015-07-11 ENCOUNTER — Encounter (HOSPITAL_COMMUNITY): Payer: Self-pay | Admitting: Emergency Medicine

## 2015-07-11 ENCOUNTER — Emergency Department (HOSPITAL_COMMUNITY)
Admission: EM | Admit: 2015-07-11 | Discharge: 2015-07-11 | Disposition: A | Discharge status: Home / Self Care | Payer: Medicaid Other | Source: Home / Self Care | Attending: Emergency Medicine | Admitting: Emergency Medicine

## 2015-07-11 DIAGNOSIS — G43009 Migraine without aura, not intractable, without status migrainosus: Secondary | ICD-10-CM

## 2015-07-11 MED ORDER — KETOROLAC TROMETHAMINE 30 MG/ML IJ SOLN
30.0000 mg | Freq: Once | INTRAMUSCULAR | Status: AC
  Administered 2015-07-11: 30 mg via INTRAMUSCULAR

## 2015-07-11 MED ORDER — DIPHENHYDRAMINE HCL 50 MG/ML IJ SOLN
25.0000 mg | Freq: Once | INTRAMUSCULAR | Status: AC
  Administered 2015-07-11: 25 mg via INTRAMUSCULAR

## 2015-07-11 MED ORDER — DEXAMETHASONE SODIUM PHOSPHATE 10 MG/ML IJ SOLN
INTRAMUSCULAR | Status: AC
  Filled 2015-07-11: qty 1

## 2015-07-11 MED ORDER — KETOROLAC TROMETHAMINE 30 MG/ML IJ SOLN
INTRAMUSCULAR | Status: AC
  Filled 2015-07-11: qty 1

## 2015-07-11 MED ORDER — ONDANSETRON 4 MG PO TBDP
4.0000 mg | ORAL_TABLET | Freq: Once | ORAL | Status: AC
  Administered 2015-07-11: 4 mg via ORAL

## 2015-07-11 MED ORDER — DEXAMETHASONE SODIUM PHOSPHATE 10 MG/ML IJ SOLN
10.0000 mg | Freq: Once | INTRAMUSCULAR | Status: AC
  Administered 2015-07-11: 10 mg via INTRAMUSCULAR

## 2015-07-11 MED ORDER — ONDANSETRON 4 MG PO TBDP
ORAL_TABLET | ORAL | Status: AC
  Filled 2015-07-11: qty 1

## 2015-07-11 MED ORDER — DIPHENHYDRAMINE HCL 50 MG/ML IJ SOLN
INTRAMUSCULAR | Status: AC
  Filled 2015-07-11: qty 1

## 2015-07-11 NOTE — Discharge Instructions (Signed)
Gave you some medicines for your migraine today. Please go home and get some sleep. When you wake up your headache should be gone or much improved. If your headaches are becoming more frequent or these medicines did not help, please see your primary care doctor or go to the emergency room.

## 2015-07-11 NOTE — ED Provider Notes (Signed)
CSN: [id number]     Arrival date & time 07/11/15  1749 History   First MD Initiated Contact with Patient 07/11/15 1844     Chief Complaint  Patient presents with  . Headache  . Dizziness   (Consider location/radiation/quality/duration/timing/severity/associated sxs/prior Treatment) HPI  She is a 59 year old woman here for evaluation of headache. She states for the last 10 days she has had a global, throbbing headache. It has gradually been getting worse. She reports photophobia and phonophobia. She denies any nausea. She does state that she has been getting dizzy and feeling off balance with a headache. She reports a history of migraine headaches, and this is her typical migraine. She has tried Tylenol and ibuprofen without improvement. No focal numbness, tingling, weakness. No vision changes.  Past Medical History  Diagnosis Date  . Allergic rhinitis   . Hypertension   . Hyperlipidemia   . GERD (gastroesophageal reflux disease)   . Glaucoma   . Neck pain     Cervical disc  . IBS (irritable bowel syndrome)   . Esophageal stricture   . Diverticulosis   . Right leg DVT ([place]) 1982    groin  . COPD (chronic obstructive pulmonary disease) ([place])   . Emphysema   . Pneumonia 2012; 04/27/2012  . Chronic bronchitis ([place])     "keep it all the time" (04/27/2012)  . Exertional dyspnea   . Type II diabetes mellitus ([place])     No meds since weight loss  . Migraine   . Epilepsy ([place])   . DJD (degenerative joint disease)   . Arthritis     "hands and legs and feet" (04/27/2012)  . Depression   . OSA on CPAP     6-7 yrs  . UTI (urinary tract infection)     2 weeks ago  . Cancer ([place]) 05    rt arm mole rem cancer  . Vertigo 10/24/2014   Past Surgical History  Procedure Laterality Date  . Tubal ligation  2001  . Knee arthroscopy  1990's    left  . Carpal tunnel release  ~ 2008    right  . Shoulder open rotator cuff repair  ~ 2010    left  . Abdominal hysterectomy  2001    "partial"   . 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 Total knee arthroplasty  08/23/2012    Procedure: TOTAL KNEE ARTHROPLASTY;  Surgeon: Lorn Junes, MD;  Location: [facility];  Service: Orthopedics;  Laterality: Right;  RIGHT KNEE ARTHROPLASTY MEDIAL AND LATERAL COMPARTMENTS WITH PATELLA RESURFACING  . Cataract extraction Bilateral    Family History  Problem Relation Age of Onset  . Heart disease Maternal Grandfather   . Diabetes Maternal Grandfather   . Heart disease      uncle  . Diabetes      uncles/aunts  . Breast cancer Maternal Aunt   . Alcohol abuse Father   . Lung cancer Father     was a smoker  . Emphysema Father     was a smoker  . Bipolar disorder Son   . Muscular dystrophy Son   . Heart disease Sister   . Colon cancer Neg Hx   . Lung cancer Mother     was a smoker  . Stroke Mother   . Other Sister     blood clotting disorder  . Heart disease Son   . Seizures Brother    Social History  Substance Use Topics  .  Smoking status: Current Every Day Smoker -- 1.00 packs/day for 36 years    Types: Cigarettes    Start date: 08/12/1975  . Smokeless tobacco: Former Systems developer    Quit date: 08/23/2012     Comment: started back smoking in 11/2012  . Alcohol Use: No   OB History    No data available     Review of Systems As in history of present illness Allergies  Amoxicillin; Azithromycin; Doxycycline hyclate; Levofloxacin; Penicillins; Sulfonamide derivatives; Chantix; Clarithromycin; Nortriptyline; Codeine; and Fluticasone-salmeterol  Home Medications   Prior to Admission medications   Medication Sig Start Date End Date Taking? Authorizing Provider  albuterol (PROAIR HFA) 108 (90 BASE) MCG/ACT inhaler Inhale 2 puffs into the lungs every 6 (six) hours as needed. Shortness of breath 04/10/15   [name], MD  albuterol (PROVENTIL) (2.5 MG/3ML) 0.083% nebulizer solution Take 3 mLs (2.5 mg total) by nebulization every 6 (six) hours as needed. For shortness of  breath 04/10/15   [name], MD  aspirin EC 81 MG tablet Take 1 tablet (81 mg total) by mouth daily. 10/04/12   Leeanne Rio, MD  calcium citrate (CALCITRATE - DOSED IN MG ELEMENTAL CALCIUM) 950 MG tablet Take 1 tablet (200 mg of elemental calcium total) by mouth 2 (two) times daily. 04/10/15   Hillary Corinda Gubler, MD  cholecalciferol (VITAMIN D) 400 UNITS TABS tablet Take 2 tablets (800 Units total) by mouth daily. 04/10/15   [name], MD  ferrous sulfate 325 (65 FE) MG tablet Take 1 tablet (325 mg total) by mouth 2 (two) times daily with a meal. 09/06/12   Donita Brooks, NP  fluticasone (FLONASE) 50 MCG/ACT nasal spray Place 2 sprays into both nostrils daily as needed. For congestion. 04/10/15   Hillary Corinda Gubler, MD  guaiFENesin (MUCINEX) 600 MG 12 hr tablet Take 1 tablet (600 mg total) by mouth 2 (two) times daily. 03/21/14   Renee A Kuneff, DO  HYDROcodone-acetaminophen (NORCO/VICODIN) 5-325 MG tablet Take 1 tablet by mouth 2 (two) times daily as needed for moderate pain. 06/25/15   Leeanne Rio, MD  lamoTRIgine (LAMICTAL) 200 MG tablet Take 1 tablet (200 mg total) by mouth 2 (two) times daily. 04/10/15   Kathrynn Ducking, MD  Loratadine 10 MG CAPS Take 1 capsule (10 mg total) by mouth daily. 05/09/15   Hillary Corinda Gubler, MD  meloxicam (MOBIC) 15 MG tablet Take 1 tablet (15 mg total) by mouth daily. 04/13/15   Devonne Doughty Karamalegos, DO  mometasone-formoterol (DULERA) 200-5 MCG/ACT AERO Inhale 2 puffs into the lungs 2 (two) times daily. 04/10/15   Hillary Corinda Gubler, MD  omeprazole (PRILOSEC) 40 MG capsule Take 1 capsule (40 mg total) by mouth 2 (two) times daily. 07/19/14   Leeanne Rio, MD  pantoprazole (PROTONIX) 40 MG tablet  04/28/15   Historical Provider, MD  predniSONE (DELTASONE) 20 MG tablet Follow taper instructions as directed. 06/25/15   Leeanne Rio, MD  QUEtiapine (SEROQUEL) 50 MG tablet Take 1 tablet (50 mg total) by  mouth at bedtime. 06/18/15   Kathrynn Ducking, MD  sertraline (ZOLOFT) 50 MG tablet Take 1 tablet (50 mg total) by mouth daily. 07/04/15   Hillary Corinda Gubler, MD  simvastatin (ZOCOR) 40 MG tablet Take 1 tablet (40 mg total) by mouth at bedtime. 04/22/15   Asiyah Cletis Media, MD  tiotropium (SPIRIVA) 18 MCG inhalation capsule Place 1 capsule (18 mcg total) into inhaler and inhale daily. 04/10/15  [name], MD  topiramate (TOPAMAX) 100 MG tablet 1tab in the am, 2 at bedtime 02/09/15   Kathrynn Ducking, MD   Meds Ordered and Administered this Visit   Medications  ketorolac (TORADOL) 30 MG/ML injection 30 mg (not administered)  dexamethasone (DECADRON) injection 10 mg (not administered)  diphenhydrAMINE (BENADRYL) injection 25 mg (not administered)  ondansetron (ZOFRAN-ODT) disintegrating tablet 4 mg (not administered)    BP 135/72 mmHg  Pulse 78  Temp(Src) 98.1 F (36.7 C) (Oral)  Resp 20  SpO2 92% No data found.   Physical Exam  Constitutional: She is oriented to person, place, and time. She appears well-developed and well-nourished. No distress.  Eyes: EOM are normal. Pupils are equal, round, and reactive to light.  Neck: Neck supple.  Cardiovascular: Normal rate, regular rhythm and normal heart sounds.   No murmur heard. Pulmonary/Chest: Effort normal. No respiratory distress. She has wheezes (occasional wheeze). She has no rales.  Neurological: She is alert and oriented to person, place, and time. No cranial nerve deficit. She exhibits normal muscle tone. Coordination normal.    ED Course  Procedures (including critical care time)  Labs Review Labs Reviewed - No data to display  Imaging Review No results found.    MDM   1. Migraine without aura and without status migrainosus, not intractable    Treatment migraine cocktail here. Follow-up with PCP.    Melony Overly, MD 07/11/15 [phone]

## 2015-07-11 NOTE — ED Notes (Signed)
The patient presented to the [facility] with a complaint of a headache that has caused dizziness that has been ongoing for 1 week. The patient stated that she has a hx of migraines that effect her gait.

## 2015-07-16 ENCOUNTER — Ambulatory Visit: Payer: Medicaid Other | Admitting: Internal Medicine

## 2015-08-01 ENCOUNTER — Other Ambulatory Visit: Payer: Self-pay | Admitting: *Deleted

## 2015-08-01 DIAGNOSIS — J441 Chronic obstructive pulmonary disease with (acute) exacerbation: Secondary | ICD-10-CM

## 2015-08-01 MED ORDER — SIMVASTATIN 40 MG PO TABS: 40.0000 mg | ORAL_TABLET | Freq: Every day | ORAL | Status: DC

## 2015-08-01 MED ORDER — ALBUTEROL SULFATE HFA 108 (90 BASE) MCG/ACT IN AERS: 2.0000 | INHALATION_SPRAY | Freq: Four times a day (QID) | RESPIRATORY_TRACT | Status: DC | PRN

## 2015-08-10 ENCOUNTER — Ambulatory Visit: Payer: Medicaid Other | Admitting: Internal Medicine

## 2015-08-16 ENCOUNTER — Ambulatory Visit: Payer: Medicaid Other | Admitting: Internal Medicine

## 2015-08-18 ENCOUNTER — Telehealth: Payer: Self-pay

## 2015-08-18 MED ORDER — TOPIRAMATE 100 MG PO TABS: ORAL_TABLET | ORAL | Status: DC

## 2015-08-23 ENCOUNTER — Encounter: Payer: Self-pay | Admitting: Internal Medicine

## 2015-08-23 ENCOUNTER — Ambulatory Visit (INDEPENDENT_AMBULATORY_CARE_PROVIDER_SITE_OTHER): Payer: Medicaid Other | Admitting: Internal Medicine

## 2015-08-23 ENCOUNTER — Other Ambulatory Visit: Payer: Self-pay

## 2015-08-23 VITALS — BP 108/70 | HR 79 | Temp 97.9°F | Ht 61.0 in | Wt 179.6 lb

## 2015-08-23 DIAGNOSIS — R05 Cough: Secondary | ICD-10-CM | POA: Diagnosis not present

## 2015-08-23 DIAGNOSIS — F17210 Nicotine dependence, cigarettes, uncomplicated: Secondary | ICD-10-CM

## 2015-08-23 DIAGNOSIS — J449 Chronic obstructive pulmonary disease, unspecified: Secondary | ICD-10-CM

## 2015-08-23 DIAGNOSIS — R059 Cough, unspecified: Secondary | ICD-10-CM

## 2015-08-23 MED ORDER — CEFDINIR 300 MG PO CAPS: 300.0000 mg | ORAL_CAPSULE | Freq: Two times a day (BID) | ORAL | Status: DC

## 2015-08-23 MED ORDER — TOPIRAMATE 100 MG PO TABS: ORAL_TABLET | ORAL | Status: DC

## 2015-08-23 NOTE — Progress Notes (Signed)
Subjective:    Patient ID: [name], female    DOB: [birth year] MRN: [id number]  HPI  18 yowf active smoker with dx of copd aware of sob around 2000 [facility] with exacerbation every few months just disharged from [facility] in 04/2012 for ? CAP with aecopd referred by Dr Para March for preop for R TKR Jan 13 14   06/18/2012 Pulmonary preop cc doe x one aisle and since last flare using inhaler saba maybe twice weekly and sleeping ok on cpap maintained on spiriva and can't take advair due to mouth irritation.  Min am cough/ congestion with thick white mucus.  rec Try to minimize smoking Try tudorza one puff twice daily instead of spiriva on trial basis for 2 weeks - call if you like it better and we'll send you a prescription     08/06/2012 f/u ov/Wert still smoking cc not limitedby breathing but by knees,  thinks tudorza works better but only using once a day. >>no changes   09/10/2012 [facility] follow up  Patient presents for a post hospital followup. She was admitted January 13-27, for elective right total knee replacement. She had postop complications with hypoxia. CT chest revealed diffuse bilateral patchy airspace disease. It was felt. Patient had possible intraoperative aspiration versus bacterial pneumonia with compounding edema. Autoimmune workup was essentially negative. She was treated with aggressive antibiotics, IV diuresis, nebulized bronchodilators, and IV steroids. She was encouraged on smoking cessation. She was discharged on oxygen therapy with activity. At 2 L. Since discharge. Patient reports that she is feeling much improved with decreased shortness of breath. She denies any cough, fever, or hemoptysis, orthopnea, PND, or increased leg swelling. She has not smoked since January 13. She was congratulated on smoking cessation. Chest x-ray today does show improved aeration. rec Keep up good work with not smoking  Continue on current regimen.  Wear O2 with activity and  As needed   Follow up Dr. Melvyn Novas  In 2 weeks and As needed   > did not return as requested    08/23/2015  f/u ov/Wert re: active smoking/ AB  Chief Complaint  Patient presents with  . Follow-up    Pt c/o increased cough and SOB for the past 6-12 months. She states that she is SOB with or without and exertion and is using abluterol inhaler and albuterol nebs 2-3 x per day. Her cough is prod with large amounts of green sputum.     Cough> sob >>> No better on prednisone / spiriva mdi and dulera 200 2bid Mucus is mostly discolored in am's and nonprod the rest of the day  No obvious day to day or daytime variability or assoc cp or chest tightness, subjective wheeze or overt sinus or hb symptoms. No unusual exp hx or h/o childhood pna/ asthma or knowledge of premature birth.  Sleeping ok without nocturnal  or early am exacerbation  of respiratory  c/o's or need for noct saba. Also denies any obvious fluctuation of symptoms with weather or environmental changes or other aggravating or alleviating factors except as outlined above   Current Medications, Allergies, Complete Past Medical History, Past Surgical History, Family History, and Social History were reviewed in [employer] record.  ROS  The following are not active complaints unless bolded sore throat, dysphagia, dental problems, itching, sneezing,  nasal congestion or excess/ purulent secretions, ear ache,   fever, chills, sweats, unintended wt loss, classically pleuritic or exertional cp, hemoptysis,  orthopnea pnd or leg  swelling, presyncope, palpitations, abdominal pain, anorexia, nausea, vomiting, diarrhea  or change in bowel or bladder habits, change in stools or urine, dysuria,hematuria,  rash, arthralgias, visual complaints, headache, numbness, weakness or ataxia or problems with walking or coordination,  change in mood/affect or memory.              Objective:   Physical Exam  amb obese wf nad/ vital signs  reviewed  08/06/2012  185 >177 09/10/2012 > 08/23/2015 180   HEENT mild turbinate edema.  Oropharynx no thrush or excess pnd or cobblestoning.  No JVD or cervical adenopathy. Mild accessory muscle hypertrophy. Trachea midline, nl thryroid. Chest was slt hyperinflated by percussion with diminished breath sounds and min  increased exp time  and mid exp bilateral sonorous rhonchi. Hoover sign positive at end inspiration. Regular rate and rhythm without murmur gallop or rub or increase P2 or edema.  Abd: no hsm, nl excursion. Ext warm without cyanosis or clubbing.       CXR PA and Lateral:   08/23/2015 :    Did not go as requested      Assessment & Plan:

## 2015-08-23 NOTE — Telephone Encounter (Signed)
Rx has been resent 

## 2015-08-23 NOTE — Telephone Encounter (Signed)
[facility] called ans states they did not get the Topamax rx. They had some software trouble at the first of the year. Can the rx be resent? Thank you

## 2015-08-23 NOTE — Patient Instructions (Addendum)
Stop spiriva   omnicef 300 twice daily x 10 days - call for sinus Ct if you still have green mucus and ask for [facility] to order it [phone])  For cough > mucinex dm up to 1200 mg every 12 hours and use the flutter valve  Work on inhaler technique:  relax and gently blow all the way out then take a nice smooth deep breath back in, triggering the inhaler at same time you start breathing in.  Hold for up to 5 seconds if you can. Blow out thru nose. Rinse and gargle with water when done    GERD (REFLUX)  is an extremely common cause of respiratory symptoms just like yours , many times with no obvious heartburn at all.    It can be treated with medication, but also with lifestyle changes including elevation of the head of your bed (ideally with 6 inch  bed blocks),  Smoking cessation, avoidance of late meals, excessive alcohol, and avoid fatty foods, chocolate, peppermint, colas, red wine, and acidic juices such as orange juice.  NO MINT OR MENTHOL PRODUCTS SO NO COUGH DROPS  USE SUGARLESS CANDY INSTEAD (Jolley ranchers or Stover's or Life Savers) or even ice chips will also do - the key is to swallow to prevent all throat clearing. NO OIL BASED VITAMINS - use powdered substitutes.    See Tammy NP w/in 2 weeks (next available) with all your medications, even over the counter meds, separated in two separate bags, the ones you take no matter what vs the ones you stop once you feel better and take only as needed when you feel you need them.   Tammy  will generate for you a new user friendly medication calendar that will put [place] all on the same page re: your medication use.     Without this process, it simply isn't possible to assure that we are providing  your outpatient care  with  the attention to detail we feel you deserve.   If we cannot assure that you're getting that kind of care,  then we cannot manage your problem effectively from this clinic.  Once you have seen Tammy and we are sure that  we're all on the same page with your medication use she will arrange follow up with me. ? Start back spirva as respimat   Needs cxr on return plus sinus CT if still green am mucus

## 2015-08-25 NOTE — Assessment & Plan Note (Addendum)
-   PFT's 08/06/2012 FEV1  1.60 (74%) ratio 70  And no better p B2,  DLCO 48% corrects to 11  Symptoms have proven difficult to control chronically. DDX of  difficult airways management almost all start with A and  include Adherence, Ace Inhibitors, Acid Reflux, Active Sinus Disease, Alpha 1 Antitripsin deficiency, Anxiety masquerading as Airways dz,  ABPA,  Allergy(esp in young), Aspiration (esp in elderly), Adverse effects of meds,  Active smokers, A bunch of PE's (a small clot burden can't cause this syndrome unless there is already severe underlying pulm or vascular dz with poor reserve) plus two Bs  = Bronchiectasis and Beta blocker use..and one C= CHF   Adherence is always the initial "prime suspect" and is a multilayered concern that requires a "trust but verify" approach in every patient - starting with knowing how to use medications, especially inhalers, correctly, keeping up with refills and understanding the fundamental difference between maintenance and prns vs those medications only taken for a very short course and then stopped and not refilled.  - The proper method of use, as well as anticipated side effects, of a metered-dose inhaler are discussed and demonstrated to the patient. Improved effectiveness after extensive coaching during this visit to a level of approximately 75 % from a baseline of 50 %  - needs med reconciliation >  To keep things simple, I have asked the patient to first separate medicines that are perceived as maintenance, that is to be taken daily "no matter what", from those medicines that are taken on only on an as-needed basis and I have given the patient examples of both, and then return to see our NP to generate a  detailed  medication calendar which should be followed until the next physician sees the patient and updates it.    Active smoking > (see separate a/p)  Adverse effect of dpi > try off spiriva and if worse can add back as the respimat version   Allergy >  doubt as no better p pred   ? Active sinus Dz suggested by am green mucus> rec continue flonase and rx  omnicef 300 mg bid x 10 days then sinus ct next   I had an extended discussion with the patient reviewing all relevant studies completed to date and  lasting 15 to 20 minutes of a 25 minute visit    Each maintenance medication was reviewed in detail including most importantly the difference between maintenance and prns and under what circumstances the prns are to be triggered using an action plan format that is not reflected in the computer generated alphabetically organized AVS.    Please see instructions for details which were reviewed in writing and the patient given a copy highlighting the part that I personally wrote and discussed at today's ov.

## 2015-08-25 NOTE — Assessment & Plan Note (Signed)

## 2015-09-03 ENCOUNTER — Other Ambulatory Visit: Payer: Self-pay | Admitting: *Deleted

## 2015-09-03 DIAGNOSIS — F410 Panic disorder [episodic paroxysmal anxiety] without agoraphobia: Secondary | ICD-10-CM

## 2015-09-03 MED ORDER — SERTRALINE HCL 50 MG PO TABS: 50.0000 mg | ORAL_TABLET | Freq: Every day | ORAL | Status: DC

## 2015-09-03 NOTE — Telephone Encounter (Signed)
Pt informed and made an appt to see pcp. Sama Arauz Kennon Holter, CMA

## 2015-09-11 ENCOUNTER — Encounter: Payer: Medicaid Other | Admitting: Adult Health

## 2015-09-18 ENCOUNTER — Encounter: Payer: Medicaid Other | Admitting: Adult Health

## 2015-09-21 ENCOUNTER — Ambulatory Visit: Payer: Medicaid Other | Admitting: Internal Medicine

## 2015-09-24 ENCOUNTER — Ambulatory Visit: Payer: Medicaid Other | Admitting: Nurse Practitioner

## 2015-09-25 ENCOUNTER — Encounter: Payer: Self-pay | Admitting: Nurse Practitioner

## 2015-09-27 ENCOUNTER — Encounter: Payer: Medicaid Other | Admitting: Adult Health

## 2015-09-28 ENCOUNTER — Other Ambulatory Visit: Payer: Self-pay | Admitting: *Deleted

## 2015-09-28 ENCOUNTER — Other Ambulatory Visit: Payer: Self-pay

## 2015-09-28 DIAGNOSIS — F410 Panic disorder [episodic paroxysmal anxiety] without agoraphobia: Secondary | ICD-10-CM

## 2015-09-28 MED ORDER — LAMOTRIGINE 200 MG PO TABS: 200.0000 mg | ORAL_TABLET | Freq: Two times a day (BID) | ORAL | Status: DC

## 2015-09-28 MED ORDER — SERTRALINE HCL 50 MG PO TABS: 50.0000 mg | ORAL_TABLET | Freq: Every day | ORAL | Status: DC

## 2015-09-28 MED ORDER — FLUTICASONE PROPIONATE 50 MCG/ACT NA SUSP: 2.0000 | Freq: Every day | NASAL | Status: DC | PRN

## 2015-09-28 MED ORDER — CHOLECALCIFEROL 10 MCG (400 UNIT) PO TABS: 800.0000 [IU] | ORAL_TABLET | Freq: Every day | ORAL | Status: DC

## 2015-09-28 MED ORDER — MOMETASONE FURO-FORMOTEROL FUM 200-5 MCG/ACT IN AERO: 2.0000 | INHALATION_SPRAY | Freq: Two times a day (BID) | RESPIRATORY_TRACT | Status: DC

## 2015-09-28 MED ORDER — CALCIUM CITRATE 950 (200 CA) MG PO TABS: 200.0000 mg | ORAL_TABLET | Freq: Two times a day (BID) | ORAL | Status: DC

## 2015-10-02 ENCOUNTER — Ambulatory Visit (INDEPENDENT_AMBULATORY_CARE_PROVIDER_SITE_OTHER): Payer: Medicaid Other | Admitting: Internal Medicine

## 2015-10-02 ENCOUNTER — Encounter: Payer: Self-pay | Admitting: Internal Medicine

## 2015-10-02 ENCOUNTER — Ambulatory Visit (HOSPITAL_COMMUNITY)
Admission: RE | Admit: 2015-10-02 | Discharge: 2015-10-02 | Disposition: A | Discharge status: Home / Self Care | Payer: Medicaid Other | Source: Ambulatory Visit | Attending: Family Medicine | Admitting: Family Medicine

## 2015-10-02 VITALS — BP 104/64 | HR 84 | Temp 97.4°F | Ht 61.0 in | Wt 172.0 lb

## 2015-10-02 DIAGNOSIS — R0602 Shortness of breath: Secondary | ICD-10-CM | POA: Diagnosis not present

## 2015-10-02 DIAGNOSIS — R519 Headache, unspecified: Secondary | ICD-10-CM

## 2015-10-02 DIAGNOSIS — J441 Chronic obstructive pulmonary disease with (acute) exacerbation: Secondary | ICD-10-CM | POA: Diagnosis not present

## 2015-10-02 DIAGNOSIS — R51 Headache: Secondary | ICD-10-CM

## 2015-10-02 MED ORDER — CEFDINIR 300 MG PO CAPS: 300.0000 mg | ORAL_CAPSULE | Freq: Two times a day (BID) | ORAL | Status: DC

## 2015-10-02 MED ORDER — KETOROLAC TROMETHAMINE 30 MG/ML IM SOLN: 30.0000 mg | Freq: Once | INTRAMUSCULAR | Status: DC

## 2015-10-02 MED ORDER — METHYLPREDNISOLONE SODIUM SUCC 125 MG IJ SOLR
62.5000 mg | Freq: Once | INTRAMUSCULAR | Status: AC
  Administered 2015-10-02: 62.5 mg via INTRAMUSCULAR

## 2015-10-02 MED ORDER — PREDNISONE 20 MG PO TABS: 40.0000 mg | ORAL_TABLET | Freq: Every day | ORAL | Status: DC

## 2015-10-02 MED ORDER — METHYLPREDNISOLONE SODIUM SUCC 125 MG IJ SOLR: 62.5000 mg | Freq: Once | INTRAMUSCULAR | Status: DC

## 2015-10-02 MED ORDER — KETOROLAC TROMETHAMINE 60 MG/2ML IM SOLN
30.0000 mg | Freq: Once | INTRAMUSCULAR | Status: AC
  Administered 2015-10-02: 30 mg via INTRAMUSCULAR

## 2015-10-02 NOTE — Patient Instructions (Signed)
Ms. Budman,  Please take oral steroids starting tomorrow morning, once daily for the next 4 days. Please take the antibiotic cefdinir 300 mg twice daily for 10 days. Please make an appointment before the end of the week to see how your symptoms are doing.  I also ordered for you to get a chest x-ray. The antibiotics should cover for both a pneumonia and a COPD exacerbation, but I will call you with results.  If you feel that you are having worsening of your breathing or chest pain, please go to the emergency room.  Best,  Dr. Ola Spurr

## 2015-10-02 NOTE — Progress Notes (Signed)
Subjective:    Patient ID: [name], female    DOB: 02/18/1956, 60 y.o.   MRN: [id number]  HPI Mrs. Delania Dijoseph is a 60-y/o female with history of COPD, OSA, chronic headaches, CHF with grade 1 diastolic dysfunction, anxiety and tobacco abuse who presents with trouble breathing.  Shortness of breath: - Chronic over last year but has been worsening for about 1 week with worsening productive cough of "usually green, sometimes clear" mucus - Had temperature to 101 F at home last week, as well as diaphoresis and muscle aches - She says son had flu but he was not around her - She has been using her albuterol nebulizer about 3 times daily, which helps for a little while - Has been using a wheelchair due to feeling weak, like she can't walk - Describes feeling like she almost stopped breathing for a few seconds on Sunday (09/30/15) - Has had decreased appetite and reports that she has only had 4-5 beverages over the past few days - Husband says she has been up most nights coughing  - Seen by pulmonologist Dr. Melvyn Novas with similar symptoms 08/23/15 and completed 10-day course of cefdinir for presumed sinus infection - Patient states breathing got worse on spiriva inhaler; respimat suggested as an alternative by Pulmonology. Is taking Dulera.   Headache: - Requesting toradol shot  - Associated with sinus pain that she has been having for about 6 months - Ibuprofen hasn't been helping - Takes topiramate 100 mg in the am, 200 mg in the pm  Tobacco abuse: - Has been smoking less while sick, a half a pack over the last 5 days  Review of Systems  Constitutional: Positive for appetite change.  HENT: Positive for sinus pressure.   Respiratory: Positive for cough and shortness of breath.   Cardiovascular: Positive for chest pain (constant for last several days, not worsened by cough or movement).  Gastrointestinal: Negative for nausea, vomiting and diarrhea.   Social: Active Smoker     Objective: Blood pressure 104/64, pulse 84, temperature 97.4 F (36.3 C), temperature source Oral, height 5\' 1"  (1.549 m), weight 172 lb (78.019 kg), SpO2 91 %.   Physical Exam  Constitutional: She is oriented to person, place, and time. She appears well-developed and well-nourished. No distress.  HENT:  Head: Normocephalic and atraumatic.  Mouth/Throat: No oropharyngeal exudate.  Eyes: Pupils are equal, round, and reactive to light.  Cardiovascular: Normal rate, regular rhythm, normal heart sounds and intact distal pulses.  Exam reveals no gallop and no friction rub.   No murmur heard. Pulmonary/Chest: Effort normal. No stridor. No respiratory distress. She has wheezes (expiratory but with good air movement). She exhibits tenderness (central, reproducible over mediastinum).  Patient speaking in complete sentences with ease. Coarse breath sounds throughout all lungs fields.   Abdominal: Soft. Bowel sounds are normal. She exhibits no distension. There is no tenderness. There is no rebound and no guarding.  Neurological: She is alert and oriented to person, place, and time.  No focal deficits.   Skin: Skin is warm and dry. She is not diaphoretic.  Psychiatric: She has a normal mood and affect. Her behavior is normal.   Last SCr was 0.93 in 01/2015    Assessment & Plan:  [name] presents with week-long history of worsening productive cough and shortness of breath. Albuterol treatments provide some relief. Differential includes COPD exacerbation and pneumonia. Could consider getting a sputum sample, but treatment is limited by antibiotic allergies (PCN, amoxicillin,  azithromycin, levaquin, doxycycline). Preference would be treatment with fluoroquinolone given > 2 COPD exacerbations in the past year. She does not appear dehydrated on exam, and vital signs are WNL. She is able to speak in complete sentences and is maintaining her oxygen saturation above 90%.   Asked patient to make follow-up  appointment before the end of the week to check for improvement in cough and feeling of SOB. Advised patient to present to emergency room if shortness of breath were to worsen.   Chronic obstructive pulmonary disease with acute exacerbation - Administered solumedrol 62.5 mg IM in clinic - Ordered prednisone 40 mg daily for 4-day course - Reordered cefdinir 300 mg BID for 10-day course due to limitation in antibiotic choice due to patient's allergies - Ordered chest x-ray  Headache - Administered 30 mg toradol IM in clinic   Olene Floss, MD [place], PGY-1

## 2015-10-03 ENCOUNTER — Telehealth: Payer: Self-pay | Admitting: Internal Medicine

## 2015-10-03 NOTE — Telephone Encounter (Signed)
Called patient and informed her that there was no pneumonia on CXR.

## 2015-10-03 NOTE — Telephone Encounter (Signed)
Pt is calling and would like to know what her chest x-rays were. jw

## 2015-10-04 NOTE — Assessment & Plan Note (Signed)
-   Administered 30 mg toradol IM in clinic

## 2015-10-04 NOTE — Assessment & Plan Note (Signed)
-   Administered solumedrol 62.5 mg IM in clinic - Ordered prednisone 40 mg daily for 4-day course - Reordered cefdinir 300 mg BID for 10-day course due to limitation in antibiotic choice due to patient's allergies - Ordered chest x-ray

## 2015-10-05 ENCOUNTER — Ambulatory Visit: Payer: Medicaid Other | Admitting: Family Medicine

## 2015-10-08 ENCOUNTER — Ambulatory Visit: Payer: Medicaid Other | Admitting: Physical Medicine & Rehabilitation

## 2015-10-08 ENCOUNTER — Ambulatory Visit: Payer: Medicaid Other

## 2015-10-09 ENCOUNTER — Ambulatory Visit: Payer: Medicaid Other | Admitting: Nurse Practitioner

## 2015-10-12 ENCOUNTER — Encounter: Payer: Self-pay | Admitting: Internal Medicine

## 2015-10-12 ENCOUNTER — Ambulatory Visit (INDEPENDENT_AMBULATORY_CARE_PROVIDER_SITE_OTHER): Payer: Medicaid Other | Admitting: Internal Medicine

## 2015-10-12 VITALS — BP 137/80 | HR 68 | Temp 98.2°F | Wt 171.4 lb

## 2015-10-12 DIAGNOSIS — R0602 Shortness of breath: Secondary | ICD-10-CM

## 2015-10-12 DIAGNOSIS — J441 Chronic obstructive pulmonary disease with (acute) exacerbation: Secondary | ICD-10-CM

## 2015-10-12 MED ORDER — IPRATROPIUM BROMIDE 0.02 % IN SOLN
0.5000 mg | Freq: Once | RESPIRATORY_TRACT | Status: AC
  Administered 2015-10-12: 0.5 mg via RESPIRATORY_TRACT

## 2015-10-12 MED ORDER — ALBUTEROL SULFATE (2.5 MG/3ML) 0.083% IN NEBU
2.5000 mg | INHALATION_SOLUTION | Freq: Once | RESPIRATORY_TRACT | Status: AC
  Administered 2015-10-12: 2.5 mg via RESPIRATORY_TRACT

## 2015-10-12 NOTE — Patient Instructions (Signed)
It was nice meeting you today [name].   Please continue to take your breathing medications as you were before.   If you start to have increased difficulty breathing, are unable to speak in full sentences because you are so short of breath, notice that you are lightheaded or dizzy because you cannot breathe, or notice that your lips or hands are turning blue, please go to the emergency room right away. Please see the handout below for additional warning signs.   If you have any questions or concerns, please feel free to call the office at any time.   Be well,  Dr. Avon Gully  Shortness of Breath Shortness of breath means you have trouble breathing. Shortness of breath needs medical care right away. HOME CARE   Do not smoke.  Avoid being around chemicals or things (paint fumes, dust) that may bother your breathing.  Rest as needed. Slowly begin your normal activities.  Only take medicines as told by your doctor.  Keep all doctor visits as told. GET HELP RIGHT AWAY IF:   Your shortness of breath gets worse.  You feel lightheaded, pass out (faint), or have a cough that is not helped by medicine.  You cough up blood.  You have pain with breathing.  You have pain in your chest, arms, shoulders, or belly (abdomen).  You have a fever.  You cannot walk up stairs or exercise the way you normally do.  You do not get better in the time expected.  You have a hard time doing normal activities even with rest.  You have problems with your medicines.  You have any new symptoms. MAKE SURE YOU:  Understand these instructions.  Will watch your condition.  Will get help right away if you are not doing well or get worse.   This information is not intended to replace advice given to you by your health care provider. Make sure you discuss any questions you have with your health care provider.   Document Released: 01/14/2008 Document Revised: 08/02/2013 Document Reviewed:  10/13/2011 Elsevier Interactive Patient Education [employer].

## 2015-10-12 NOTE — Progress Notes (Signed)
   Subjective:    Patient ID: [name], female    DOB: 10-29-1955, 60 y.o.   MRN: [id number]  HPI  Patient presents for follow-up of COPD exacerbation.   Patient seen about 10 days ago for increasing SOB, and was diagnosed with COPD exacerbation. She was given solumedrol 62.5mg  IM in office, and was prescribed 4 day burst of prednisone 40mg , and 10 days of cefdinir 300mg  BID. She reports taking prednisone and antibiotics are prescribed and not missing any doses. Patient reports no improvement in symptoms since, and feels that her coughing has worsened. She has been using her PRN nebs TID, and says they are not providing as much relief as they used to.  Patient reports some difficulty speaking in full sentences first thing in the morning, but says this is not new. Denies chest pain. Denies perioral cyanosis.  Current every day smoker (says smoking 1pack per week).  Review of Systems See HPI.     Objective:   Physical Exam  Constitutional: She is oriented to person, place, and time. She appears well-developed and well-nourished. No distress.  HENT:  Head: Normocephalic and atraumatic.  Pulmonary/Chest: Effort normal.  Speaking in full sentences on RA. Diminished breath sounds bilaterally. Wheezes most prominent in lung bases. Scattered rhonchi mostly on L.   Neurological: She is alert and oriented to person, place, and time.  Psychiatric: She has a normal mood and affect. Her behavior is normal.  Vitals reviewed.     Assessment & Plan:  Chronic obstructive pulmonary disease with acute exacerbation Respiratory status unchanged. Patient recently completed steroid course and is scheduled to complete antibiotic course tomorrow, so prescribing more steroids or antibiotics unlikely to provide much benefit. Patient is on max dose of Dulera, so considered adding Spiriva, however patient reports she was taken off Spiriva one month ago by her pulmonologist as it was not improving her symptoms.  Patient not requiring supplemental oxygen (O2 sat in high 90s on RA), able to speak in full sentences, is not in respiratory distress, and vitals are stable, so no indication for admission at this time. Breath sounds improved after neb treatment, with improvement air movement and decreased wheezing.  - Received Atrovent and albuterol neb in clinic with improvement in symptoms - Continue home Dulera, albuterol, and albuterol nebs PRN - Gave strict return precautions   Adin Hector, MD PGY-1 [facility] Pager [phone]

## 2015-10-12 NOTE — Assessment & Plan Note (Addendum)
Respiratory status unchanged. Patient recently completed steroid course and is scheduled to complete antibiotic course tomorrow, so prescribing more steroids or antibiotics unlikely to provide much benefit. Patient is on max dose of Dulera, so considered adding Spiriva, however patient reports she was taken off Spiriva one month ago by her pulmonologist as it was not improving her symptoms. Patient not requiring supplemental oxygen (O2 sat in high 90s on RA), able to speak in full sentences, is not in respiratory distress, and vitals are stable, so no indication for admission at this time. Breath sounds improved after neb treatment, with improvement air movement and decreased wheezing.  - Received Atrovent and albuterol neb in clinic with improvement in symptoms - Continue home Dulera, albuterol, and albuterol nebs PRN - Gave strict return precautions

## 2015-10-23 ENCOUNTER — Ambulatory Visit: Payer: Medicaid Other | Admitting: Physical Medicine & Rehabilitation

## 2015-10-25 ENCOUNTER — Other Ambulatory Visit: Payer: Self-pay | Admitting: *Deleted

## 2015-10-25 DIAGNOSIS — F410 Panic disorder [episodic paroxysmal anxiety] without agoraphobia: Secondary | ICD-10-CM

## 2015-10-26 MED ORDER — LORATADINE 10 MG PO CAPS: 10.0000 mg | ORAL_CAPSULE | Freq: Every day | ORAL | Status: DC

## 2015-10-26 MED ORDER — ALBUTEROL SULFATE (2.5 MG/3ML) 0.083% IN NEBU: 2.5000 mg | INHALATION_SOLUTION | Freq: Four times a day (QID) | RESPIRATORY_TRACT | Status: DC | PRN

## 2015-10-26 MED ORDER — SERTRALINE HCL 50 MG PO TABS: 50.0000 mg | ORAL_TABLET | Freq: Every day | ORAL | Status: DC

## 2015-10-31 ENCOUNTER — Other Ambulatory Visit: Payer: Self-pay | Admitting: *Deleted

## 2015-10-31 DIAGNOSIS — F410 Panic disorder [episodic paroxysmal anxiety] without agoraphobia: Secondary | ICD-10-CM

## 2015-10-31 MED ORDER — ALBUTEROL SULFATE (2.5 MG/3ML) 0.083% IN NEBU: 2.5000 mg | INHALATION_SOLUTION | Freq: Four times a day (QID) | RESPIRATORY_TRACT | Status: DC | PRN

## 2015-10-31 MED ORDER — SERTRALINE HCL 50 MG PO TABS: 50.0000 mg | ORAL_TABLET | Freq: Every day | ORAL | Status: DC

## 2015-11-02 ENCOUNTER — Other Ambulatory Visit: Payer: Self-pay | Admitting: *Deleted

## 2015-11-02 MED ORDER — PANTOPRAZOLE SODIUM 40 MG PO TBEC: 40.0000 mg | DELAYED_RELEASE_TABLET | Freq: Two times a day (BID) | ORAL | Status: DC

## 2015-11-02 MED ORDER — LORATADINE 10 MG PO CAPS: 10.0000 mg | ORAL_CAPSULE | Freq: Every day | ORAL | Status: DC

## 2015-11-05 ENCOUNTER — Encounter: Payer: Self-pay | Admitting: Adult Health

## 2015-11-05 ENCOUNTER — Other Ambulatory Visit: Payer: Self-pay | Admitting: *Deleted

## 2015-11-05 ENCOUNTER — Ambulatory Visit (INDEPENDENT_AMBULATORY_CARE_PROVIDER_SITE_OTHER): Payer: Medicaid Other | Admitting: Adult Health

## 2015-11-05 ENCOUNTER — Encounter (INDEPENDENT_AMBULATORY_CARE_PROVIDER_SITE_OTHER): Payer: Self-pay

## 2015-11-05 VITALS — BP 106/64 | HR 106 | Temp 97.7°F | Ht 61.0 in | Wt 174.0 lb

## 2015-11-05 DIAGNOSIS — J449 Chronic obstructive pulmonary disease, unspecified: Secondary | ICD-10-CM

## 2015-11-05 MED ORDER — PANTOPRAZOLE SODIUM 40 MG PO TBEC: 40.0000 mg | DELAYED_RELEASE_TABLET | Freq: Two times a day (BID) | ORAL | Status: DC

## 2015-11-05 NOTE — Telephone Encounter (Signed)
Nikkie from [facility] called regarding the pantoprazole quantity.  The quantity is only a 15 day supply.  They need a quantity of 60 which is a 30 day supply.  Please advised.  Please give them a call at [phone].  Derl Barrow, RN

## 2015-11-05 NOTE — Addendum Note (Signed)
Addended by: Osa Craver on: 11/05/2015 02:50 PM   Modules accepted: Orders

## 2015-11-05 NOTE — Progress Notes (Signed)
Subjective:    Patient ID: [name], female    DOB: Jun 13, 1956 MRN: [id number]  HPI  29 yowf active smoker with dx of copd aware of sob around 2000 [facility] with exacerbation every few months just disharged from [facility] in 04/2012 for ? CAP with aecopd referred by Dr Para March for preop for R TKR Jan 13 14   06/18/2012 Pulmonary preop cc doe x one aisle and since last flare using inhaler saba maybe twice weekly and sleeping ok on cpap maintained on spiriva and can't take advair due to mouth irritation.  Min am cough/ congestion with thick white mucus.  rec Try to minimize smoking Try tudorza one puff twice daily instead of spiriva on trial basis for 2 weeks - call if you like it better and we'll send you a prescription     08/06/2012 f/u ov/Wert still smoking cc not limitedby breathing but by knees,  thinks tudorza works better but only using once a day. >>no changes   09/10/2012 [facility] follow up  Patient presents for a post hospital followup. She was admitted January 13-27, for elective right total knee replacement. She had postop complications with hypoxia. CT chest revealed diffuse bilateral patchy airspace disease. It was felt. Patient had possible intraoperative aspiration versus bacterial pneumonia with compounding edema. Autoimmune workup was essentially negative. She was treated with aggressive antibiotics, IV diuresis, nebulized bronchodilators, and IV steroids. She was encouraged on smoking cessation. She was discharged on oxygen therapy with activity. At 2 L. Since discharge. Patient reports that she is feeling much improved with decreased shortness of breath. She denies any cough, fever, or hemoptysis, orthopnea, PND, or increased leg swelling. She has not smoked since January 13. She was congratulated on smoking cessation. Chest x-ray today does show improved aeration. rec Keep up good work with not smoking  Continue on current regimen.  Wear O2 with activity and  As needed   Follow up Dr. Melvyn Novas  In 2 weeks and As needed   > did not return as requested    08/23/2015  f/u ov/Wert re: active smoking/ AB  Chief Complaint  Patient presents with  . Follow-up    Pt c/o increased cough and SOB for the past 6-12 months. She states that she is SOB with or without and exertion and is using abluterol inhaler and albuterol nebs 2-3 x per day. Her cough is prod with large amounts of green sputum.     Cough> sob >>> No better on prednisone / spiriva mdi and dulera 200 2bid Mucus is mostly discolored in am's and nonprod the rest of the day  as outlined above >>stop spiriva , omnicef rx   11/05/2015 Follow up : AB /smoker  Pt returns for 2 month follow up and med review  Pt had bronchitis last ov , tx w/ omnicef . Did get slightly better but  Symptoms returned with increased cough .  No chest pain, orthopnea, edema or hemoptysis .  Still smoking , 1 ppd , smoking cessation encouraged.     Current Medications, Allergies, Complete Past Medical History, Past Surgical History, Family History, and Social History were reviewed in [employer] record.  ROS  The following are not active complaints unless bolded sore throat, dysphagia, dental problems, itching, sneezing,  nasal congestion or excess/ purulent secretions, ear ache,   fever, chills, sweats, unintended wt loss, classically pleuritic or exertional cp, hemoptysis,  orthopnea pnd or leg swelling, presyncope, palpitations, abdominal pain, anorexia, nausea,  vomiting, diarrhea  or change in bowel or bladder habits, change in stools or urine, dysuria,hematuria,  rash, arthralgias, visual complaints, headache, numbness, weakness or ataxia or problems with walking or coordination,  change in mood/affect or memory.              Objective:   Physical Exam  amb obese wf nad/ vital signs reviewed Filed Vitals:   11/05/15 1411  BP: 106/64  Pulse: 106  Temp: 97.7 F (36.5 C)  TempSrc: Oral   Height: 5\' 1"  (1.549 m)  Weight: 174 lb (78.926 kg)  SpO2: 95%    08/06/2012  185 >177 09/10/2012 > 08/23/2015 180   HEENT mild turbinate edema.  Oropharynx no thrush or excess pnd or cobblestoning.  No JVD or cervical adenopathy. Mild accessory muscle hypertrophy. Trachea midline, nl thryroid. Chest was slt hyperinflated by percussion with diminished breath sounds and min  increased exp time  and mid exp bilateral sonorous rhonchi. Hoover sign positive at end inspiration. Regular rate and rhythm without murmur gallop or rub or increase P2 or edema.  Abd: no hsm, nl excursion. Ext warm without cyanosis or clubbing.       Coral Timme NP-C  [facility]  11/05/2015     Assessment & Plan:

## 2015-11-05 NOTE — Addendum Note (Signed)
Addended by: Osa Craver on: 11/05/2015 02:54 PM   Modules accepted: Orders

## 2015-11-05 NOTE — Progress Notes (Signed)
Chart and office note reviewed in detail  > agree with a/p as outlined    

## 2015-11-05 NOTE — Assessment & Plan Note (Signed)
Recent flare in active smoking with ongoing cough  Check CT sinus and HRCT of chest  preveious CXR and CT chest(2015 ) with chronic changes.  ? ILD component.  .Patient's medications were reviewed today and patient education was given. Computerized medication calendar was adjusted/completed   Plan  We are setting you up for a CT sinus and chest (HR)  Follow med calendar closely and bring to each visit.  Work on not smoking.  follow up Dr. Melvyn Novas  In 6-8 weeks and As needed   Please contact office for sooner follow up if symptoms do not improve or worsen or seek emergency care

## 2015-11-05 NOTE — Patient Instructions (Signed)
We are setting you up for a CT sinus and chest (HR)  Follow med calendar closely and bring to each visit.  Work on not smoking.  follow up Dr. Melvyn Novas  In 6-8 weeks and As needed   Please contact office for sooner follow up if symptoms do not improve or worsen or seek emergency care

## 2015-11-07 ENCOUNTER — Ambulatory Visit (INDEPENDENT_AMBULATORY_CARE_PROVIDER_SITE_OTHER)
Admission: RE | Admit: 2015-11-07 | Discharge: 2015-11-07 | Disposition: A | Discharge status: Home / Self Care | Payer: Medicaid Other | Source: Ambulatory Visit | Attending: Adult Health | Admitting: Adult Health

## 2015-11-07 DIAGNOSIS — J449 Chronic obstructive pulmonary disease, unspecified: Secondary | ICD-10-CM | POA: Diagnosis not present

## 2015-11-08 ENCOUNTER — Telehealth: Payer: Self-pay | Admitting: Adult Health

## 2015-11-08 DIAGNOSIS — J449 Chronic obstructive pulmonary disease, unspecified: Secondary | ICD-10-CM

## 2015-11-08 DIAGNOSIS — J349 Unspecified disorder of nose and nasal sinuses: Secondary | ICD-10-CM

## 2015-11-08 NOTE — Telephone Encounter (Signed)
Pt requesting CT Chest and Sinus results.  TP, please advise.  Thank you.

## 2015-11-09 NOTE — Telephone Encounter (Signed)
Patient notified of CT results. Patient does not have ENT, put in referral for ENT. Nothing further needed.

## 2015-11-09 NOTE — Telephone Encounter (Signed)
No results  on CT sinus/CT chest. Please advise TP thanks

## 2015-11-09 NOTE — Telephone Encounter (Signed)
Pt calling for results of ct please advise.[name]

## 2015-11-09 NOTE — Telephone Encounter (Signed)
CT chest some mild scarring in upper lungs probably from previous infection in past.  NO diffuse scarring or fibroiss . No ILD . -good news.  CT sinus shows very severe chronic sinus dz on the right  No acute infection  If she does not have an ENT would refer to ENT.  Stop smoking  follow up as planned and  As needed

## 2015-11-12 ENCOUNTER — Telehealth: Payer: Self-pay | Admitting: Internal Medicine

## 2015-11-12 ENCOUNTER — Encounter: Payer: Self-pay | Admitting: Nurse Practitioner

## 2015-11-12 ENCOUNTER — Ambulatory Visit (INDEPENDENT_AMBULATORY_CARE_PROVIDER_SITE_OTHER): Payer: Medicaid Other | Admitting: Nurse Practitioner

## 2015-11-12 VITALS — BP 116/70 | HR 88 | Resp 20 | Ht 61.0 in | Wt 173.0 lb

## 2015-11-12 DIAGNOSIS — G43711 Chronic migraine without aura, intractable, with status migrainosus: Secondary | ICD-10-CM | POA: Diagnosis not present

## 2015-11-12 DIAGNOSIS — G40909 Epilepsy, unspecified, not intractable, without status epilepticus: Secondary | ICD-10-CM | POA: Insufficient documentation

## 2015-11-12 DIAGNOSIS — J349 Unspecified disorder of nose and nasal sinuses: Secondary | ICD-10-CM

## 2015-11-12 MED ORDER — LAMOTRIGINE 200 MG PO TABS: 200.0000 mg | ORAL_TABLET | Freq: Two times a day (BID) | ORAL | Status: DC

## 2015-11-12 MED ORDER — TOPIRAMATE 100 MG PO TABS: ORAL_TABLET | ORAL | Status: DC

## 2015-11-12 NOTE — Telephone Encounter (Signed)
[name] from [facility] calls. States Dr. Melvyn Novas would like pt to see an ENT. Since pt has [facility] [place] [facility], referral must come from her PCP. Please place an ENT referral. DX J34.9 (ICD-10-CM) - Sinus disease

## 2015-11-12 NOTE — Telephone Encounter (Signed)
Placed referral  

## 2015-11-12 NOTE — Progress Notes (Signed)
[facility]  PATIENT: [name] DOB: March 20, 1956   REASON FOR VISIT: Follow-up for seizure disorder and intractable migraines HISTORY FROM: Patient    HISTORY OF PRESENT ILLNESS:[name] is a 60 year old right-handed white female with a history of intractable epilepsy, and intractable headache. The patient is on lamotrigine and Topamax without full control of the seizures, the patient was sent for video EEG monitoring study at [facility], and the patient did not have any seizure episodes during the monitoring period. The patient has not had any seizures since the video EEG, this was done in September 2016.  The patient is not operating a motor vehicle. She still has frequent headaches, she may have 7 headache free days a month only. The headaches when they do come on last all day long. The patient has been tried on a multitude of medications including lamotrigine, Topamax, nortriptyline, Seroquel, hydrocodone, Advil, and Tylenol. The patient cannot take triptan medications. She may have some nausea, usually not vomiting with the headache. The headaches are impacting her ability to function during the day. She has an appointment to be evaluated for Botox tomorrow and she was encouraged to keep that appointment. She also had recent CT of the sinuses with  significant sinus disease and was referred to ENT. The patient returns to the office today for an evaluation.   REVIEW OF SYSTEMS: Full 14 system review of systems performed and notable only for those listed, all others are neg:  Constitutional: neg  Cardiovascular: neg Ear/Nose/Throat: neg  Skin: neg Eyes: neg Respiratory: Cough Gastroitestinal: neg  Hematology/Lymphatic: neg  Endocrine: neg Musculoskeletal:neg Allergy/Immunology: neg Neurological: Headache, seizures, tremors Psychiatric: neg Sleep : neg   ALLERGIES: Allergies  Allergen Reactions  . Amoxicillin Anaphylaxis    Breakout, mouth swells  . Azithromycin  Swelling and Rash    "swelling all over, high, thrush"  . Doxycycline Hyclate Anaphylaxis and Rash    swelling  . Levofloxacin Anaphylaxis  . Penicillins Shortness Of Breath and Rash    "almost died, ended up in hospital"  . Sulfonamide Derivatives Shortness Of Breath, Swelling and Rash    "break out from head to toe"  . Chantix [Varenicline] Other (See Comments)    "bad dreams, difficulty breathing"  . Clarithromycin Hives    swelling  . Nortriptyline Other (See Comments)    Per patient had "kidney problems" on this medication, could not use bathroom (urinary retention), swelling and breakout  . Codeine Nausea And Vomiting  . Fluticasone-Salmeterol Other (See Comments)    REACTION: ulcers in mouth    HOME MEDICATIONS: Outpatient Prescriptions Prior to Visit  Medication Sig Dispense Refill  . albuterol (PROAIR HFA) 108 (90 BASE) MCG/ACT inhaler Inhale 2 puffs into the lungs every 6 (six) hours as needed. Shortness of breath 1 Inhaler 3  . albuterol (PROVENTIL) (2.5 MG/3ML) 0.083% nebulizer solution Take 3 mLs (2.5 mg total) by nebulization every 6 (six) hours as needed. For shortness of breath 75 mL 6  . aspirin EC 81 MG tablet Take 1 tablet (81 mg total) by mouth daily.    . calcium citrate (CALCITRATE - DOSED IN MG ELEMENTAL CALCIUM) 950 MG tablet Take 1 tablet (200 mg of elemental calcium total) by mouth 2 (two) times daily. 60 tablet 5  . cefdinir (OMNICEF) 300 MG capsule Take 1 capsule (300 mg total) by mouth 2 (two) times daily. 20 capsule 0  . cholecalciferol (VITAMIN D) 400 units TABS tablet Take 2 tablets (800 Units total) by mouth  daily. 60 each 5  . ferrous sulfate 325 (65 FE) MG tablet Take 1 tablet (325 mg total) by mouth 2 (two) times daily with a meal. 60 tablet 3  . fluticasone (FLONASE) 50 MCG/ACT nasal spray Place 2 sprays into both nostrils daily as needed. For congestion. 16 g 5  . guaiFENesin (MUCINEX) 600 MG 12 hr tablet Take 1 tablet (600 mg total) by mouth 2  (two) times daily. 60 tablet 0  . HYDROcodone-acetaminophen (NORCO/VICODIN) 5-325 MG tablet Take 1 tablet by mouth 2 (two) times daily as needed for moderate pain. 45 tablet 0  . ketorolac (TORADOL) 30 MG/ML injection Inject 1 mL (30 mg total) into the muscle once. 1 mL 0  . lamoTRIgine (LAMICTAL) 200 MG tablet Take 1 tablet (200 mg total) by mouth 2 (two) times daily. 60 tablet 5  . Loratadine 10 MG CAPS Take 1 capsule (10 mg total) by mouth daily. 30 each 5  . mometasone-formoterol (DULERA) 200-5 MCG/ACT AERO Inhale 2 puffs into the lungs 2 (two) times daily. 13 g 5  . pantoprazole (PROTONIX) 40 MG tablet Take 1 tablet (40 mg total) by mouth 2 (two) times daily before a meal. 60 tablet 5  . QUEtiapine (SEROQUEL) 50 MG tablet Take 1 tablet (50 mg total) by mouth at bedtime. 90 tablet 1  . sertraline (ZOLOFT) 50 MG tablet Take 1 tablet (50 mg total) by mouth daily. 30 tablet 0  . simvastatin (ZOCOR) 40 MG tablet Take 1 tablet (40 mg total) by mouth at bedtime. 30 tablet 3  . topiramate (TOPAMAX) 100 MG tablet 1tab in the am, 2 at bedtime 270 tablet 0  . meloxicam (MOBIC) 15 MG tablet Take 1 tablet (15 mg total) by mouth daily. (Patient not taking: Reported on 10/12/2015) 30 tablet 1  . methylPREDNISolone sodium succinate (SOLU-MEDROL) 125 mg/2 mL injection Inject 1 mL (62.5 mg total) into the muscle once. (Patient not taking: Reported on 10/12/2015) 1 each 0  . predniSONE (DELTASONE) 20 MG tablet Take 2 tablets (40 mg total) by mouth daily with breakfast. (Patient not taking: Reported on 10/12/2015) 8 tablet 0   No facility-administered medications prior to visit.    PAST MEDICAL HISTORY: Past Medical History  Diagnosis Date  . Allergic rhinitis   . Hypertension   . Hyperlipidemia   . GERD (gastroesophageal reflux disease)   . Glaucoma   . Neck pain     Cervical disc  . IBS (irritable bowel syndrome)   . Esophageal stricture   . Diverticulosis   . Right leg DVT ([place]) 1982    groin  . COPD  (chronic obstructive pulmonary disease) ([place])   . Emphysema   . Pneumonia 2012; 04/27/2012  . Chronic bronchitis ([place])     "keep it all the time" (04/27/2012)  . Exertional dyspnea   . Type II diabetes mellitus ([place])     No meds since weight loss  . Migraine   . Epilepsy ([place])   . DJD (degenerative joint disease)   . Arthritis     "hands and legs and feet" (04/27/2012)  . Depression   . OSA on CPAP     6-7 yrs  . UTI (urinary tract infection)     2 weeks ago  . Cancer ([place]) 05    rt arm mole rem cancer  . Vertigo 10/24/2014    PAST SURGICAL HISTORY: Past Surgical History  Procedure Laterality Date  . Tubal ligation  2001  . Knee arthroscopy  1990's  left  . Carpal tunnel release  ~ 2008    right  . Shoulder open rotator cuff repair  ~ 2010    left  . Abdominal hysterectomy  2001    "partial"  . 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 Total knee arthroplasty  08/23/2012    Procedure: TOTAL KNEE ARTHROPLASTY;  Surgeon: Lorn Junes, MD;  Location: [place];  Service: Orthopedics;  Laterality: Right;  RIGHT KNEE ARTHROPLASTY MEDIAL AND LATERAL COMPARTMENTS WITH PATELLA RESURFACING  . Cataract extraction Bilateral     FAMILY HISTORY: Family History  Problem Relation Age of Onset  . Heart disease Maternal Grandfather   . Diabetes Maternal Grandfather   . Heart disease      uncle  . Diabetes      uncles/aunts  . Breast cancer Maternal Aunt   . Alcohol abuse Father   . Lung cancer Father     was a smoker  . Emphysema Father     was a smoker  . Bipolar disorder Son   . Muscular dystrophy Son   . Heart disease Sister   . Colon cancer Neg Hx   . Lung cancer Mother     was a smoker  . Stroke Mother   . Other Sister     blood clotting disorder  . Heart disease Son   . Seizures Brother     SOCIAL HISTORY: Social History   Social History  . Marital Status: Married    Spouse Name: [name]  . Number of Children: 2  . Years of Education: 12    Occupational History  . disabled    Social History Main Topics  . Smoking status: Current Every Day Smoker -- 1.00 packs/day for 36 years    Types: Cigarettes    Start date: 08/12/1975  . Smokeless tobacco: Former Systems developer    Quit date: 08/23/2012     Comment: started back smoking in 11/2012  . Alcohol Use: No  . Drug Use: No  . Sexual Activity: No   Other Topics Concern  . Not on file   Social History Narrative   Patient lives at home with husband [name], her son and his wife.    Patient has 2 children.    Patient has a high school education.    Patient is currently unemployed.    Patient is right handed.    Patient drinks 2 cups of caffeine per day.        PHYSICAL EXAM  Filed Vitals:   11/12/15 1419  BP: 116/70  Pulse: 88  Resp: 20  Height: 5\' 1"  (1.549 m)  Weight: 173 lb (78.472 kg)   Body mass index is 32.7 kg/(m^2).  Generalized: Well developed, obese female in no acute distress  Head: normocephalic and atraumatic,. Oropharynx benign  Neck: Supple, no carotid bruits  Cardiac: Regular rate rhythm, no murmur  Musculoskeletal: No deformity   Neurological examination   Mentation: Alert oriented to time, place, history taking. Attention span and concentration appropriate. Recent and remote memory intact.  Follows all commands speech and language fluent.   Cranial nerve II-XII: Pupils were equal round reactive to light extraocular movements were full, visual field were full on confrontational test. Facial sensation and strength were normal. hearing was intact to finger rubbing bilaterally. Uvula tongue midline. head turning and shoulder shrug were normal and symmetric.Tongue protrusion into cheek strength was normal. Motor: normal bulk and tone, full strength in the BUE, BLE, fine finger movements  normal, no pronator drift. No focal weakness Sensory: normal and symmetric to light touch, pinprick, and  Vibration in the upper and lower extremities,  Coordination:  finger-nose-finger, heel-to-shin bilaterally, no dysmetria Reflexes: Brachioradialis 2/2, biceps 2/2, triceps 2/2, patellar 2/2, Achilles 2/2, plantar responses were flexor bilaterally. Gait and Station: Rising up from seated position without assistance, wide base stance, uses a rolling walker, tandem gait not attempted Romberg is negative   DIAGNOSTIC DATA (LABS, IMAGING, TESTING) - I reviewed patient records, labs, notes, testing and imaging myself where available.  Lab Results  Component Value Date   WBC 9.5 02/08/2015   HGB 11.0* 08/07/2014   HCT 35.6 02/08/2015   MCV 91 02/08/2015   PLT 252 02/08/2015      Component Value Date/Time   NA 144 02/08/2015 1453   NA 140 08/07/2014 0726   K 4.0 02/08/2015 1453   CL 107 02/08/2015 1453   CO2 22 02/08/2015 1453   GLUCOSE 104* 02/08/2015 1453   GLUCOSE 92 08/07/2014 0726   BUN 19 02/08/2015 1453   BUN 16 08/07/2014 0726   CREATININE 0.93 02/08/2015 1453   CREATININE 0.89 06/19/2014 1656   CALCIUM 8.9 02/08/2015 1453   PROT 6.7 02/08/2015 1453   PROT 6.6 06/19/2014 1656   ALBUMIN 3.9 02/08/2015 1453   ALBUMIN 3.9 06/19/2014 1656   AST 11 02/08/2015 1453   ALT 6 02/08/2015 1453   ALKPHOS 84 02/08/2015 1453   BILITOT <0.2 02/08/2015 1453   BILITOT 0.2 06/19/2014 1656   GFRNONAA 68 02/08/2015 1453   GFRAA 79 02/08/2015 1453       ASSESSMENT AND PLAN  60 y.o. year old female  has a past medical history of Intractable epilepsy and intractable migraine headaches. She has not had further seizure activity since monitoring at [facility] in September 2016. She is currently on Topamax and Lamictal. She has an evaluation for Botox tomorrow. CT of sinuses with significant sinus disease. She has been referred to ENT   Continue Lamictal 200mg  twice daily will refill Continue Topamax  100mg  in the am and 200mg  in the pm will refill Evaluation for Botox tomorrow Follow up with ENT for sinus disease F/U in 6 months Dennie Bible,  [facility], [facility], [facility] [address], [place], [place] [address] [phone]

## 2015-11-12 NOTE — Progress Notes (Signed)
I have read the note, and I agree with the clinical assessment and plan.  [name] KEITH   

## 2015-11-12 NOTE — Patient Instructions (Signed)
Continue Lamictal at current dose will refill Continue Topamax at current dose will refill Evaluation for Botox tomorrow F/U in 6 months

## 2015-11-13 ENCOUNTER — Ambulatory Visit (HOSPITAL_BASED_OUTPATIENT_CLINIC_OR_DEPARTMENT_OTHER): Payer: Medicaid Other | Admitting: Physical Medicine & Rehabilitation

## 2015-11-13 ENCOUNTER — Encounter: Payer: Self-pay | Admitting: Physical Medicine & Rehabilitation

## 2015-11-13 ENCOUNTER — Encounter: Payer: Medicaid Other | Attending: Physical Medicine & Rehabilitation

## 2015-11-13 DIAGNOSIS — E785 Hyperlipidemia, unspecified: Secondary | ICD-10-CM | POA: Diagnosis not present

## 2015-11-13 DIAGNOSIS — K219 Gastro-esophageal reflux disease without esophagitis: Secondary | ICD-10-CM | POA: Diagnosis not present

## 2015-11-13 DIAGNOSIS — J439 Emphysema, unspecified: Secondary | ICD-10-CM | POA: Diagnosis not present

## 2015-11-13 DIAGNOSIS — K589 Irritable bowel syndrome without diarrhea: Secondary | ICD-10-CM | POA: Diagnosis not present

## 2015-11-13 DIAGNOSIS — R51 Headache: Secondary | ICD-10-CM | POA: Diagnosis present

## 2015-11-13 DIAGNOSIS — R42 Dizziness and giddiness: Secondary | ICD-10-CM | POA: Diagnosis not present

## 2015-11-13 DIAGNOSIS — E119 Type 2 diabetes mellitus without complications: Secondary | ICD-10-CM | POA: Diagnosis not present

## 2015-11-13 DIAGNOSIS — M542 Cervicalgia: Secondary | ICD-10-CM | POA: Insufficient documentation

## 2015-11-13 DIAGNOSIS — H409 Unspecified glaucoma: Secondary | ICD-10-CM | POA: Insufficient documentation

## 2015-11-13 DIAGNOSIS — G40909 Epilepsy, unspecified, not intractable, without status epilepticus: Secondary | ICD-10-CM | POA: Diagnosis not present

## 2015-11-13 DIAGNOSIS — J449 Chronic obstructive pulmonary disease, unspecified: Secondary | ICD-10-CM | POA: Insufficient documentation

## 2015-11-13 DIAGNOSIS — C7641 Malignant neoplasm of right upper limb: Secondary | ICD-10-CM | POA: Diagnosis not present

## 2015-11-13 DIAGNOSIS — K5790 Diverticulosis of intestine, part unspecified, without perforation or abscess without bleeding: Secondary | ICD-10-CM | POA: Diagnosis not present

## 2015-11-13 DIAGNOSIS — G43711 Chronic migraine without aura, intractable, with status migrainosus: Secondary | ICD-10-CM

## 2015-11-13 DIAGNOSIS — M1990 Unspecified osteoarthritis, unspecified site: Secondary | ICD-10-CM | POA: Insufficient documentation

## 2015-11-13 DIAGNOSIS — I10 Essential (primary) hypertension: Secondary | ICD-10-CM | POA: Diagnosis not present

## 2015-11-13 DIAGNOSIS — G43909 Migraine, unspecified, not intractable, without status migrainosus: Secondary | ICD-10-CM | POA: Diagnosis not present

## 2015-11-13 NOTE — Progress Notes (Signed)
Subjective:    Patient ID: [name], female    DOB: [birth year], 58 y.o.   MRN: [id number]  HPI  Chief complaint headaches 60 year old female who states she has had headaches since she was young. She has pain on the top of her head the side of her head front of her head and the back of her head as well as her neck. She has tried numerous medications including over-the-counter analgesics, Topamax, nortriptyline, lamotrigine, as well as hydrocodone. She's had no beneficial effects and has also had side effects from medications. She has approximately 21 headache days per month, she has episodes where she has to stay in bed perhaps 3 times per month. She has gone to the doctor for Toradol shots in the past but these are not helpful. Triptans cause mouth sores and itching all over Last MRI was performed 4/5//2016, this showed nonspecific periventricular white matter changes.  Patient denies any allergies to 8 whites. She is not on aminoglycosides. No history of ALS. No history of myasthenia gravis.  Past medical history does include seizure disorder which is currently well controlled.  Functional status is independent however she does walk with a walker because her legs give out on her. She has end-stage osteoarthritis of the left knee was advised by an orthopedic surgeon to get a knee replacement. She has undergone previous right right knee replacement.  Pain Inventory Average Pain 10 Pain Right Now 8 My pain is headaches  In the last 24 hours, has pain interfered with the following? General activity 8 Relation with others 10 Enjoyment of life 0 What TIME of day is your pain at its worst? morning and evenings Sleep (in general) Poor  Pain is worse with: walking, sitting, standing and some activites Pain improves with: injections Relief from Meds: 2  Mobility use a walker how many minutes can you walk? 10 ability to climb steps?  no do you drive?  no  Function I need assistance  with the following:  household duties  Neuro/Psych weakness numbness trouble walking dizziness  Prior Studies Any changes since last visit?  no  Physicians involved in your care Neurologist Dr [name]   Family History  Problem Relation Age of Onset  . Heart disease Maternal Grandfather   . Diabetes Maternal Grandfather   . Heart disease      uncle  . Diabetes      uncles/aunts  . Breast cancer Maternal Aunt   . Alcohol abuse Father   . Lung cancer Father     was a smoker  . Emphysema Father     was a smoker  . Bipolar disorder Son   . Muscular dystrophy Son   . Heart disease Sister   . Colon cancer Neg Hx   . Lung cancer Mother     was a smoker  . Stroke Mother   . Other Sister     blood clotting disorder  . Heart disease Son   . Seizures Brother    Social History   Social History  . Marital Status: Married    Spouse Name: [name]  . Number of Children: 2  . Years of Education: 12   Occupational History  . disabled    Social History Main Topics  . Smoking status: Current Every Day Smoker -- 1.00 packs/day for 36 years    Types: Cigarettes    Start date: 08/12/1975  . Smokeless tobacco: Former Systems developer    Quit date: 08/23/2012  Comment: started back smoking in 11/2012  . Alcohol Use: No  . Drug Use: No  . Sexual Activity: No   Other Topics Concern  . None   Social History Narrative   Patient lives at home with husband [name], her son and his wife.    Patient has 2 children.    Patient has a high school education.    Patient is currently unemployed.    Patient is right handed.    Patient drinks 2 cups of caffeine per day.      Past Surgical History  Procedure Laterality Date  . Tubal ligation  2001  . Knee arthroscopy  1990's    left  . Carpal tunnel release  ~ 2008    right  . Shoulder open rotator cuff repair  ~ 2010    left  . Abdominal hysterectomy  2001    "partial"  . 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 Total knee  arthroplasty  08/23/2012    Procedure: TOTAL KNEE ARTHROPLASTY;  Surgeon: Lorn Junes, MD;  Location: [facility];  Service: Orthopedics;  Laterality: Right;  RIGHT KNEE ARTHROPLASTY MEDIAL AND LATERAL COMPARTMENTS WITH PATELLA RESURFACING  . Cataract extraction Bilateral    Past Medical History  Diagnosis Date  . Allergic rhinitis   . Hypertension   . Hyperlipidemia   . GERD (gastroesophageal reflux disease)   . Glaucoma   . Neck pain     Cervical disc  . IBS (irritable bowel syndrome)   . Esophageal stricture   . Diverticulosis   . Right leg DVT ([place]) 1982    groin  . COPD (chronic obstructive pulmonary disease) ([place])   . Emphysema   . Pneumonia 2012; 04/27/2012  . Chronic bronchitis ([place])     "keep it all the time" (04/27/2012)  . Exertional dyspnea   . Type II diabetes mellitus ([place])     No meds since weight loss  . Migraine   . Epilepsy ([place])   . DJD (degenerative joint disease)   . Arthritis     "hands and legs and feet" (04/27/2012)  . Depression   . OSA on CPAP     6-7 yrs  . UTI (urinary tract infection)     2 weeks ago  . Cancer ([place]) 05    rt arm mole rem cancer  . Vertigo 10/24/2014   There were no vitals taken for this visit.  Opioid Risk Score:   Fall Risk Score:  `1  Depression screen PHQ 2/9  Depression screen [facility] 2/9 10/02/2015 06/25/2015 05/18/2015 03/23/2015 01/29/2015 01/10/2015 11/20/2014  Decreased Interest 0 0 0 0 0 0 0  Down, Depressed, Hopeless 0 0 0 0 0 0 0  PHQ - 2 Score 0 0 0 0 0 0 0     Review of Systems  Respiratory: Positive for apnea, cough, shortness of breath and wheezing.   All other systems reviewed and are negative.      Objective:   Physical Exam  Constitutional: She is oriented to person, place, and time. She appears well-developed and well-nourished.  HENT:  Head: Normocephalic and atraumatic.  Eyes: Conjunctivae and EOM are normal. Pupils are equal, round, and reactive to light.  Neck: Normal range of motion.  Neck pain  with flexion and extension lateral bending and rotation  Cardiovascular: Normal rate, regular rhythm and normal heart sounds.   Pulmonary/Chest: Effort normal and breath sounds normal.  Abdominal: Soft. Bowel sounds are normal.  Musculoskeletal:  Right knee: She exhibits normal range of motion. Tenderness found.       Left knee: She exhibits normal range of motion. Tenderness found.  Neurological: She is alert and oriented to person, place, and time. She has normal strength. She displays no atrophy. No sensory deficit.  Reflex Scores:      Tricep reflexes are 1+ on the right side and 1+ on the left side.      Bicep reflexes are 1+ on the right side and 1+ on the left side.      Brachioradialis reflexes are 1+ on the right side and 1+ on the left side.      Patellar reflexes are 1+ on the right side and 1+ on the left side.      Achilles reflexes are 1+ on the right side and 1+ on the left side. Motor strength is 5/5 bilateral deltoid, biceps, triceps, grip, hip flexor, knee extensor, ankle dorsiflexor  Sensation is intact to light touch bilateral upper and lower limbs  Skin: Skin is warm and dry.  Psychiatric: She has a normal mood and affect. Her behavior is normal.  Nursing note and vitals reviewed.  There is mild tenderness palpation along the cervical spine. There is no tenderness along the temporalis frontal sinuses or occipital area. No tenderness along the sternocleidomastoid. No increased tone in the cervical paraspinal muscles.       Assessment & Plan:  1. Chronic migraine headaches with greater than 15 headache episodes per month sometimes resulting in status migrainosus. The patient has tried prophylactic medications as well as anti-inflammatories. She is unable to use triptan's. She has not tried botulinum toxin injections. I think she would be an appropriate candidate for trial. We discussed that it may take at least a couple rounds of Botox injections to assess full  affect. Would use Allergan protocol.

## 2015-11-13 NOTE — Patient Instructions (Signed)
OnabotulinumtoxinA injection (Medical Use) What is this medicine? ONABOTULINUMTOXINA (o na BOTT you lye num tox in eh) is a neuro-muscular blocker. This medicine is used to treat crossed eyes, eyelid spasms, severe neck muscle spasms, ankle and toe muscle spasms, and elbow, wrist, and finger muscle spasms. It is also used to treat excessive underarm sweating, to prevent chronic migraine headaches, and to treat loss of bladder control due to neurologic conditions such as multiple sclerosis or spinal cord injury. This medicine may be used for other purposes; ask your health care provider or pharmacist if you have questions. What should I tell my health care provider before I take this medicine? They need to know if you have any of these conditions: -breathing problems -cerebral palsy spasms -difficulty urinating -heart problems -history of surgery where this medicine is going to be used -infection at the site where this medicine is going to be used -myasthenia gravis or other neurologic disease -nerve or muscle disease -surgery plans -take medicines that treat or prevent blood clots -thyroid problems -an unusual or allergic reaction to botulinum toxin, albumin, other medicines, foods, dyes, or preservatives -pregnant or trying to get pregnant -breast-feeding How should I use this medicine? This medicine is for injection into a muscle. It is given by a health care professional in a hospital or clinic setting. Talk to your pediatrician regarding the use of this medicine in children. While this drug may be prescribed for children as young as 12 years old for selected conditions, precautions do apply. Overdosage: If you think you have taken too much of this medicine contact a poison control center or emergency room at once. NOTE: This medicine is only for you. Do not share this medicine with others. What if I miss a dose? This does not apply. What may interact with this  medicine? -aminoglycoside antibiotics like gentamicin, neomycin, tobramycin -muscle relaxants -other botulinum toxin injections This list may not describe all possible interactions. Give your health care provider a list of all the medicines, herbs, non-prescription drugs, or dietary supplements you use. Also tell them if you smoke, drink alcohol, or use illegal drugs. Some items may interact with your medicine. What should I watch for while using this medicine? Visit your doctor for regular check ups. This medicine will cause weakness in the muscle where it is injected. Tell your doctor if you feel unusually weak in other muscles. Get medical help right away if you have problems with breathing, swallowing, or talking. This medicine might make your eyelids droop or make you see blurry or double. If you have weak muscles or trouble seeing do not drive a car, use machinery, or do other dangerous activities. This medicine contains albumin from human blood. It may be possible to pass an infection in this medicine, but no cases have been reported. Talk to your doctor about the risks and benefits of this medicine. If your activities have been limited by your condition, go back to your regular routine slowly after treatment with this medicine. What side effects may I notice from receiving this medicine? Side effects that you should report to your doctor or health care professional as soon as possible: -allergic reactions like skin rash, itching or hives, swelling of the face, lips, or tongue -breathing problems -changes in vision -chest pain or tightness -eye irritation, pain -fast, irregular heartbeat -infection -numbness -speech problems -swallowing problems -unusual weakness Side effects that usually do not require medical attention (report to your doctor or health care professional if they continue   or are bothersome): -bruising or pain at site where injected -drooping eyelid -dry eyes or  mouth -headache -muscles aches, pains -sensitivity to light -tearing This list may not describe all possible side effects. Call your doctor for medical advice about side effects. You may report side effects to FDA at 1-800-FDA-1088. Where should I keep my medicine? This drug is given in a hospital or clinic and will not be stored at home. NOTE: This sheet is a summary. It may not cover all possible information. If you have questions about this medicine, talk to your doctor, pharmacist, or health care provider.    2016, Elsevier/Gold Standard. (2014-09-05 15:43:53)  

## 2015-11-14 ENCOUNTER — Telehealth: Payer: Self-pay | Admitting: Internal Medicine

## 2015-11-14 NOTE — Telephone Encounter (Signed)
Patient informed. 

## 2015-11-14 NOTE — Telephone Encounter (Signed)
Referral has been placed. Please inform patient. Thank you!

## 2015-11-14 NOTE — Telephone Encounter (Signed)
Pt called and said that she was told that she has a sinus disease and she needs to be referred to an ENT doctor. jw

## 2015-11-26 NOTE — Addendum Note (Signed)
Addended by: Osa Craver on: 11/26/2015 03:59 PM   Modules accepted: Orders, Medications

## 2015-11-28 ENCOUNTER — Other Ambulatory Visit: Payer: Self-pay | Admitting: Internal Medicine

## 2015-11-28 ENCOUNTER — Other Ambulatory Visit: Payer: Self-pay | Admitting: Neurology

## 2015-11-30 ENCOUNTER — Encounter: Payer: Self-pay | Admitting: Physical Medicine & Rehabilitation

## 2015-11-30 ENCOUNTER — Ambulatory Visit (HOSPITAL_BASED_OUTPATIENT_CLINIC_OR_DEPARTMENT_OTHER): Payer: Medicaid Other | Admitting: Physical Medicine & Rehabilitation

## 2015-11-30 VITALS — BP 101/62 | HR 81 | Resp 15

## 2015-11-30 DIAGNOSIS — G43909 Migraine, unspecified, not intractable, without status migrainosus: Secondary | ICD-10-CM | POA: Diagnosis not present

## 2015-11-30 DIAGNOSIS — G43711 Chronic migraine without aura, intractable, with status migrainosus: Secondary | ICD-10-CM | POA: Diagnosis not present

## 2015-11-30 NOTE — Patient Instructions (Signed)
You received a botulinum toxin injection for chronic headache prevention. You may have soreness in your injection site, you may use ice for 20-30 minutes every 2 hours to help with soreness.  Maximum effect of the injection will be at around 1 week. Other side effects include possible muscle weakness which will subside as the medication wears off.  Medication may be repeated every 3 months as needed. 

## 2015-11-30 NOTE — Progress Notes (Signed)
Botox injection for chronic migraine prophylaxis.  Indication: History of migraines with greater than 15 headaches per month despite trials of oral medications.  Informed consent was obtained after discussing risks and benefits of the procedure with the patient this included bleeding bruising infection as well as facial drooping, eyelid lag, systemic effects of Botox. Patient elects to proceed and has given written consent.  Patient placed in a seated position Dilution 50 units per cc  Muscles injected with dose  Procerus 5 units Corrugator 5 units bilateral Frontalis 5 units 2 injection sites on the right and 2 injection sites on the left Temporalis 5 units in 4 injection sites on the right and 4 injection sites on the left Occipitalis 5 units into 3 injections sites on the right and 3 injection sites on the left  Cervical paraspinals 5 units into 2 injection sites on the right and 2 injection sites on the left trapezius 5 units into 3 injection sites on the right and 3 injection sites on the left  Patient tolerated procedure well. Post procedure instructions given. Appointment for repeat injection in 3 months Medical management as per neurolog 

## 2015-12-06 ENCOUNTER — Telehealth: Payer: Self-pay | Admitting: Neurology

## 2015-12-06 NOTE — Telephone Encounter (Signed)
[place] [phone] called regarding needed safety documentation for QUEtiapine (SEROQUEL) 50 MG tablet, please call [phone]. Advised Dr. [name] is out of the office this week. May call next week when Dr. [name] is back in the office.

## 2015-12-07 NOTE — Telephone Encounter (Signed)
Returned call to [facility] to give safety documentation on Seroquel. PA # [id number] approved through 12/01/16. Call ID # [id number].

## 2015-12-14 ENCOUNTER — Ambulatory Visit: Payer: Medicaid Other | Admitting: Internal Medicine

## 2015-12-20 ENCOUNTER — Other Ambulatory Visit: Payer: Self-pay | Admitting: Internal Medicine

## 2015-12-21 ENCOUNTER — Ambulatory Visit: Payer: Medicaid Other | Admitting: Internal Medicine

## 2015-12-24 ENCOUNTER — Ambulatory Visit: Payer: Medicaid Other | Admitting: Internal Medicine

## 2015-12-25 ENCOUNTER — Ambulatory Visit: Payer: Medicaid Other | Admitting: Internal Medicine

## 2015-12-31 ENCOUNTER — Ambulatory Visit: Payer: Medicaid Other | Admitting: Internal Medicine

## 2016-01-14 ENCOUNTER — Ambulatory Visit: Payer: Medicaid Other | Admitting: Physical Medicine & Rehabilitation

## 2016-01-17 ENCOUNTER — Ambulatory Visit: Payer: Medicaid Other | Admitting: Internal Medicine

## 2016-01-18 ENCOUNTER — Other Ambulatory Visit: Payer: Self-pay | Admitting: Internal Medicine

## 2016-01-28 ENCOUNTER — Ambulatory Visit: Payer: Medicaid Other | Admitting: Physical Medicine & Rehabilitation

## 2016-01-29 ENCOUNTER — Ambulatory Visit: Payer: Medicaid Other | Admitting: Physical Medicine & Rehabilitation

## 2016-02-06 DIAGNOSIS — J342 Deviated nasal septum: Secondary | ICD-10-CM

## 2016-02-06 DIAGNOSIS — J343 Hypertrophy of nasal turbinates: Secondary | ICD-10-CM | POA: Insufficient documentation

## 2016-02-19 ENCOUNTER — Encounter: Payer: Medicaid Other | Attending: Physical Medicine & Rehabilitation

## 2016-02-19 ENCOUNTER — Ambulatory Visit: Payer: Medicaid Other | Admitting: Internal Medicine

## 2016-02-19 ENCOUNTER — Other Ambulatory Visit: Payer: Self-pay | Admitting: Internal Medicine

## 2016-02-19 ENCOUNTER — Ambulatory Visit (HOSPITAL_BASED_OUTPATIENT_CLINIC_OR_DEPARTMENT_OTHER): Payer: Medicaid Other | Admitting: Physical Medicine & Rehabilitation

## 2016-02-19 ENCOUNTER — Encounter: Payer: Self-pay | Admitting: Physical Medicine & Rehabilitation

## 2016-02-19 VITALS — BP 99/70 | HR 90 | Resp 14

## 2016-02-19 DIAGNOSIS — K5790 Diverticulosis of intestine, part unspecified, without perforation or abscess without bleeding: Secondary | ICD-10-CM | POA: Insufficient documentation

## 2016-02-19 DIAGNOSIS — R42 Dizziness and giddiness: Secondary | ICD-10-CM | POA: Insufficient documentation

## 2016-02-19 DIAGNOSIS — J439 Emphysema, unspecified: Secondary | ICD-10-CM | POA: Diagnosis not present

## 2016-02-19 DIAGNOSIS — C7641 Malignant neoplasm of right upper limb: Secondary | ICD-10-CM | POA: Insufficient documentation

## 2016-02-19 DIAGNOSIS — E785 Hyperlipidemia, unspecified: Secondary | ICD-10-CM | POA: Insufficient documentation

## 2016-02-19 DIAGNOSIS — K589 Irritable bowel syndrome without diarrhea: Secondary | ICD-10-CM | POA: Diagnosis not present

## 2016-02-19 DIAGNOSIS — J449 Chronic obstructive pulmonary disease, unspecified: Secondary | ICD-10-CM | POA: Diagnosis not present

## 2016-02-19 DIAGNOSIS — I10 Essential (primary) hypertension: Secondary | ICD-10-CM | POA: Insufficient documentation

## 2016-02-19 DIAGNOSIS — E119 Type 2 diabetes mellitus without complications: Secondary | ICD-10-CM | POA: Diagnosis not present

## 2016-02-19 DIAGNOSIS — G40909 Epilepsy, unspecified, not intractable, without status epilepticus: Secondary | ICD-10-CM | POA: Insufficient documentation

## 2016-02-19 DIAGNOSIS — M542 Cervicalgia: Secondary | ICD-10-CM | POA: Insufficient documentation

## 2016-02-19 DIAGNOSIS — H409 Unspecified glaucoma: Secondary | ICD-10-CM | POA: Insufficient documentation

## 2016-02-19 DIAGNOSIS — K219 Gastro-esophageal reflux disease without esophagitis: Secondary | ICD-10-CM | POA: Diagnosis not present

## 2016-02-19 DIAGNOSIS — G43711 Chronic migraine without aura, intractable, with status migrainosus: Secondary | ICD-10-CM | POA: Diagnosis not present

## 2016-02-19 DIAGNOSIS — G43909 Migraine, unspecified, not intractable, without status migrainosus: Secondary | ICD-10-CM | POA: Insufficient documentation

## 2016-02-19 DIAGNOSIS — R51 Headache: Secondary | ICD-10-CM | POA: Diagnosis present

## 2016-02-19 DIAGNOSIS — M1990 Unspecified osteoarthritis, unspecified site: Secondary | ICD-10-CM | POA: Diagnosis not present

## 2016-02-19 NOTE — Progress Notes (Signed)
Subjective:    Patient ID: [name], female    DOB: Nov 13, 1955, 60 y.o.   MRN: [id number]  HPI   Pain Inventory Average Pain 2 Pain Right Now 0 My pain is intermittent, sharp and dull  In the last 24 hours, has pain interfered with the following? General activity 0 Relation with others 1 Enjoyment of life 0 What TIME of day is your pain at its worst? morning , evening Sleep (in general) Poor  Pain is worse with: bending and some activites Pain improves with: pacing activities Relief from Meds: 5  Mobility walk without assistance walk with assistance use a walker how many minutes can you walk? 20 ability to climb steps?  no do you drive?  no Do you have any goals in this area?  no  Function disabled: date disabled .  Neuro/Psych weakness numbness trouble walking dizziness  Prior Studies Any changes since last visit?  yes  Physicians involved in your care Any changes since last visit?  no   Family History  Problem Relation Age of Onset  . Heart disease Maternal Grandfather   . Diabetes Maternal Grandfather   . Heart disease      uncle  . Diabetes      uncles/aunts  . Breast cancer Maternal Aunt   . Alcohol abuse Father   . Lung cancer Father     was a smoker  . Emphysema Father     was a smoker  . Bipolar disorder Son   . Muscular dystrophy Son   . Heart disease Sister   . Colon cancer Neg Hx   . Lung cancer Mother     was a smoker  . Stroke Mother   . Other Sister     blood clotting disorder  . Heart disease Son   . Seizures Brother    Social History   Social History  . Marital Status: Married    Spouse Name: [name]  . Number of Children: 2  . Years of Education: 12   Occupational History  . disabled    Social History Main Topics  . Smoking status: Current Every Day Smoker -- 1.00 packs/day for 36 years    Types: Cigarettes    Start date: 08/12/1975  . Smokeless tobacco: Former Systems developer    Quit date: 08/23/2012     Comment:  started back smoking in 11/2012  . Alcohol Use: No  . Drug Use: No  . Sexual Activity: No   Other Topics Concern  . None   Social History Narrative   Patient lives at home with husband [name], her son and his wife.    Patient has 2 children.    Patient has a high school education.    Patient is currently unemployed.    Patient is right handed.    Patient drinks 2 cups of caffeine per day.      Past Surgical History  Procedure Laterality Date  . Tubal ligation  2001  . Knee arthroscopy  1990's    left  . Carpal tunnel release  ~ 2008    right  . Shoulder open rotator cuff repair  ~ 2010    left  . Abdominal hysterectomy  2001    "partial"  . 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 Total knee arthroplasty  08/23/2012    Procedure: TOTAL KNEE ARTHROPLASTY;  Surgeon: Lorn Junes, MD;  Location: [facility];  Service: Orthopedics;  Laterality: Right;  RIGHT  KNEE ARTHROPLASTY MEDIAL AND LATERAL COMPARTMENTS WITH PATELLA RESURFACING  . Cataract extraction Bilateral    Past Medical History  Diagnosis Date  . Allergic rhinitis   . Hypertension   . Hyperlipidemia   . GERD (gastroesophageal reflux disease)   . Glaucoma   . Neck pain     Cervical disc  . IBS (irritable bowel syndrome)   . Esophageal stricture   . Diverticulosis   . Right leg DVT ([place]) 1982    groin  . COPD (chronic obstructive pulmonary disease) ([place])   . Emphysema   . Pneumonia 2012; 04/27/2012  . Chronic bronchitis ([place])     "keep it all the time" (04/27/2012)  . Exertional dyspnea   . Type II diabetes mellitus ([place])     No meds since weight loss  . Migraine   . Epilepsy ([place])   . DJD (degenerative joint disease)   . Arthritis     "hands and legs and feet" (04/27/2012)  . Depression   . OSA on CPAP     6-7 yrs  . UTI (urinary tract infection)     2 weeks ago  . Cancer ([place]) 05    rt arm mole rem cancer  . Vertigo 10/24/2014   BP 99/70 mmHg  Pulse 90  Resp 14  SpO2 95%  Opioid Risk Score:    Fall Risk Score:  `1  Depression screen PHQ 2/9  Depression screen [facility] 2/9 10/02/2015 06/25/2015 05/18/2015 03/23/2015 01/29/2015 01/10/2015 11/20/2014  Decreased Interest 0 0 0 0 0 0 0  Down, Depressed, Hopeless 0 0 0 0 0 0 0  PHQ - 2 Score 0 0 0 0 0 0 0     Review of Systems  HENT: Negative.   Eyes: Negative.   Respiratory: Positive for apnea, shortness of breath and wheezing.   Genitourinary: Negative.   Musculoskeletal: Positive for gait problem.  Allergic/Immunologic: Negative.   Neurological: Positive for dizziness, weakness and numbness.  Hematological: Negative.   Psychiatric/Behavioral: Negative.   All other systems reviewed and are negative.      Objective:   Physical Exam        Assessment & Plan:  Botox injection for chronic migraine prophylaxis.  Indication: History of migraines with greater than 15 headaches per month despite trials of oral medications.  Informed consent was obtained after discussing risks and benefits of the procedure with the patient this included bleeding bruising infection as well as facial drooping, eyelid lag, systemic effects of Botox. Patient elects to proceed and has given written consent.  Patient placed in a seated position Dilution 50 units per cc  Muscles injected with dose  Procerus 5 units Corrugator 5 units bilateral Frontalis 5 units 2 injection sites on the right and 2 injection sites on the left Temporalis 5 units in 4 injection sites on the right and 4 injection sites on the left Occipitalis 5 units into 3 injections sites on the right and 3 injection sites on the left  Cervical paraspinals 5 units into 2 injection sites on the right and 2 injection sites on the left trapezius 5 units into 3 injection sites on the right and 3 injection sites on the left  Patient tolerated procedure well. Post procedure instructions given. Appointment for repeat injection in 3 months Medical management as per neurolog

## 2016-02-19 NOTE — Patient Instructions (Signed)
Botox for migraines may have soreness and injection sites, he also had bruising as well as facial drooping.

## 2016-02-26 ENCOUNTER — Encounter: Payer: Self-pay | Admitting: Student in an Organized Health Care Education/Training Program

## 2016-02-26 ENCOUNTER — Ambulatory Visit (INDEPENDENT_AMBULATORY_CARE_PROVIDER_SITE_OTHER): Payer: Medicaid Other | Admitting: Student in an Organized Health Care Education/Training Program

## 2016-02-26 VITALS — BP 106/63 | HR 80 | Temp 97.6°F | Ht 61.0 in | Wt 172.6 lb

## 2016-02-26 DIAGNOSIS — M545 Low back pain, unspecified: Secondary | ICD-10-CM | POA: Insufficient documentation

## 2016-02-26 MED ORDER — MELOXICAM 7.5 MG PO TABS: 7.5000 mg | ORAL_TABLET | Freq: Every day | ORAL | Status: DC

## 2016-02-26 NOTE — Assessment & Plan Note (Addendum)
Likely musculoskeletal etiology,  no signs of nerve compression, no red flags - Recommended continuing tylenol - Added Meloxicam 7.5mg  daily - Follow up in 4 weeks if not improved. Consider next steps; muscle relaxant, increase NSAID dose, physical therapy

## 2016-02-26 NOTE — Patient Instructions (Addendum)
It was a pleasure seeing you today in our clinic. Today we discussed your neck and back pain. Here is the treatment plan we have discussed and agreed upon together: - Meloxicam, tylenol for pain control - Please follow up in 1 month if symptoms have not improved  Our clinic's number is [phone]. Feel free to call with questions or concerns about what we discussed today.  - Dr. Burr Medico  Back Pain, Adult Back pain is very common in adults.The cause of back pain is rarely dangerous and the pain often gets better over time.The cause of your back pain may not be known. Some common causes of back pain include:  Strain of the muscles or ligaments supporting the spine.  Wear and tear (degeneration) of the spinal disks.  Arthritis.  Direct injury to the back. For many people, back pain may return. Since back pain is rarely dangerous, most people can learn to manage this condition on their own. HOME CARE INSTRUCTIONS Watch your back pain for any changes. The following actions may help to lessen any discomfort you are feeling:  Remain active. It is stressful on your back to sit or stand in one place for long periods of time. Do not sit, drive, or stand in one place for more than 30 minutes at a time. Take short walks on even surfaces as soon as you are able.Try to increase the length of time you walk each day.  Exercise regularly as directed by your health care provider. Exercise helps your back heal faster. It also helps avoid future injury by keeping your muscles strong and flexible.  Do not stay in bed.Resting more than 1-2 days can delay your recovery.  Pay attention to your body when you bend and lift. The most comfortable positions are those that put less stress on your recovering back. Always use proper lifting techniques, including:  Bending your knees.  Keeping the load close to your body.  Avoiding twisting.  Find a comfortable position to sleep. Use a firm mattress and lie on  your side with your knees slightly bent. If you lie on your back, put a pillow under your knees.  Avoid feeling anxious or stressed.Stress increases muscle tension and can worsen back pain.It is important to recognize when you are anxious or stressed and learn ways to manage it, such as with exercise.  Take medicines only as directed by your health care provider. Over-the-counter medicines to reduce pain and inflammation are often the most helpful.Your health care provider may prescribe muscle relaxant drugs.These medicines help dull your pain so you can more quickly return to your normal activities and healthy exercise.  Apply ice to the injured area:  Put ice in a plastic bag.  Place a towel between your skin and the bag.  Leave the ice on for 20 minutes, 2-3 times a day for the first 2-3 days. After that, ice and heat may be alternated to reduce pain and spasms.  Maintain a healthy weight. Excess weight puts extra stress on your back and makes it difficult to maintain good posture. SEEK MEDICAL CARE IF:  You have pain that is not relieved with rest or medicine.  You have increasing pain going down into the legs or buttocks.  You have pain that does not improve in one week.  You have night pain.  You lose weight.  You have a fever or chills. SEEK IMMEDIATE MEDICAL CARE IF:   You develop new bowel or bladder control problems.  You have  unusual weakness or numbness in your arms or legs.  You develop nausea or vomiting.  You develop abdominal pain.  You feel faint.   This information is not intended to replace advice given to you by your health care provider. Make sure you discuss any questions you have with your health care provider.   Document Released: 07/28/2005 Document Revised: 08/18/2014 Document Reviewed: 11/29/2013 Elsevier Interactive Patient Education [employer].

## 2016-02-26 NOTE — Progress Notes (Signed)
   CC: Back Pain  HPI: [name] is a 60 y.o. female who presents to [facility] today with midline neck pain and mid-back pain of 4 weeks duration.  Patient reports she is most bothered by her back pain which is described as sharp, and non-radiating.  It is worse with prolonged standing and leaning forward, improves with straightening her back. Not palliated with tylenol. She denies history of trauma or an inciting injury.   No associated paresthesias or weakness.  She denies fecal/urinary incontinence, no urinary frequency/hematuria/dysuria, no fever/chills/night sweats, no unexplained weight loss, no IVDA, no history of cancer or immunosuppression.    Review of Symptoms: See HPI for ROS.  CC, SH/smoking status, and VS noted.  Objective: BP 106/63 mmHg  Pulse 80  Temp(Src) 97.6 F (36.4 C) (Oral)  Ht 5\' 1"  (1.549 m)  Wt 172 lb 9.6 oz (78.291 kg)  BMI 32.63 kg/m2 GEN: NAD, alert, cooperative, and pleasant. BACK: +mild tenderness to palpation midline T5-T6 region, no paraspinal tenderness, no palpable mass or step-off NECK: full ROM GU: No CVA tenderness SKIN: warm and dry, no rashes or lesions NEURO: II-XII grossly intact, normal gait, peripheral sensation intact   Assessment and plan:  Midline low back pain without sciatica Likely musculoskeletal,  No signs of nerve compression, no red flags - Recommended continuing tylenol - Added Meloxicam 7.5mg  daily - Follow up in 4 weeks if not improved. Consider next steps; muscle relaxant, increase NSAID dose, physical therapy    No orders of the defined types were placed in this encounter.    Meds ordered this encounter  Medications  . meloxicam (MOBIC) 7.5 MG tablet    Sig: Take 1 tablet (7.5 mg total) by mouth daily.    Dispense:  30 tablet    Refill:  0     Everrett Coombe, MD,MS,  PGY1 02/26/2016 5:28 PM

## 2016-03-03 ENCOUNTER — Ambulatory Visit: Payer: Medicaid Other | Admitting: Internal Medicine

## 2016-03-19 ENCOUNTER — Other Ambulatory Visit: Payer: Self-pay | Admitting: Internal Medicine

## 2016-03-20 ENCOUNTER — Ambulatory Visit (INDEPENDENT_AMBULATORY_CARE_PROVIDER_SITE_OTHER): Payer: Medicaid Other | Admitting: Internal Medicine

## 2016-03-20 ENCOUNTER — Encounter: Payer: Self-pay | Admitting: Internal Medicine

## 2016-03-20 VITALS — BP 113/73 | HR 92 | Temp 97.9°F | Ht 61.0 in | Wt 173.0 lb

## 2016-03-20 DIAGNOSIS — R7303 Prediabetes: Secondary | ICD-10-CM

## 2016-03-20 DIAGNOSIS — R52 Pain, unspecified: Secondary | ICD-10-CM

## 2016-03-20 DIAGNOSIS — Z01818 Encounter for other preprocedural examination: Secondary | ICD-10-CM

## 2016-03-20 DIAGNOSIS — M1712 Unilateral primary osteoarthritis, left knee: Secondary | ICD-10-CM | POA: Diagnosis not present

## 2016-03-20 DIAGNOSIS — Z1159 Encounter for screening for other viral diseases: Secondary | ICD-10-CM

## 2016-03-20 DIAGNOSIS — Z114 Encounter for screening for human immunodeficiency virus [HIV]: Secondary | ICD-10-CM

## 2016-03-20 DIAGNOSIS — Z5189 Encounter for other specified aftercare: Secondary | ICD-10-CM

## 2016-03-20 LAB — COMPLETE METABOLIC PANEL WITH GFR
ALBUMIN: 4.2 g/dL (ref 3.6–5.1)
ALK PHOS: 105 U/L (ref 33–130)
ALT: 6 U/L (ref 6–29)
AST: 12 U/L (ref 10–35)
BILIRUBIN TOTAL: 0.3 mg/dL (ref 0.2–1.2)
BUN: 11 mg/dL (ref 7–25)
CALCIUM: 9.2 mg/dL (ref 8.6–10.4)
CHLORIDE: 105 mmol/L (ref 98–110)
CO2: 25 mmol/L (ref 20–31)
CREATININE: 0.96 mg/dL (ref 0.50–1.05)
GFR, EST AFRICAN AMERICAN: 75 mL/min (ref >=60)
GFR, Est Non African American: 65 mL/min (ref >=60)
Glucose, Bld: 87 mg/dL (ref 65–99)
Potassium: 3.8 mmol/L (ref 3.5–5.3)
Sodium: 137 mmol/L (ref 135–146)
TOTAL PROTEIN: 7 g/dL (ref 6.1–8.1)

## 2016-03-20 LAB — POCT GLYCOSYLATED HEMOGLOBIN (HGB A1C): Hemoglobin A1C: 5.6

## 2016-03-20 MED ORDER — HYDROCODONE-ACETAMINOPHEN 5-325 MG PO TABS: 1.0000 | ORAL_TABLET | Freq: Two times a day (BID) | ORAL | 0 refills | Status: DC | PRN

## 2016-03-20 NOTE — Progress Notes (Signed)
[facility] Progress Note  Subjective:  [name] is a 60-y/o female who presents for request for pre-surgical evaluation and knee pain.  Pre-surgical assessment: - Patient intends to have surgery with [facility] Dr. Wilburn Cornelia to correct chronic nasal airway obstruction seen on CT maxillofacial. Planned procedure, per last office note 03/10/16, was septoplasty, turbinate reduction and limited right endoscopic sinus surgery to be performed outpatient under general anesthesia.   - Pt says Dr. Wilburn Cornelia has requested cardiology referral for pre-op risk assessment - Patient has centralized chest pain "on-and-off" chronically. No chest pain at present. - Had normal myoview 11/16/12 - ECHO 08/06/14 showed G1DD with trace MR and TR - Previously diagnosed with pre-diabetes but after weight loss has had Hgb A1c < 5.8 since 2011 - Has COPD with last flair 10/2015; has occasional cough brought on by heat so tries to limit time outside. CT 10/2015 without evidence of ILD.   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 Constitutional: Obese female, pleasant in NAD Cardiovascular: RRR, S1, S2, no m/r/g.  Pulmonary/Chest: Effort normal and breath sounds normal. No respiratory distress.  Musculoskeletal: No obvious effusion. Legs symmetric with minimal swelling of medial aspect of L knee. Increased pain with McMurray's test and varus and valgus  testing on L. TTP over joint line of L knee, medial > lateral. Negative Homan's sign bilaterally. Scar across R knee.  Skin: Skin is warm and dry. No rash noted. No erythema.  Vitals reviewed  INJECTION: Patient was given informed consent, signed copy in the chart. Appropriate time out was taken. Area prepped and draped in usual sterile fashion. 1 cc of methylprednisolone 40 mg/ml plus 4 cc of 1% lidocaine without epinephrine was injected into the left knee using a lateral approach. Cold spray was used for superficial anesthetic. The patient tolerated the procedure well. There were no complications. Post procedure instructions were given.  Assessment/Plan: Pre-operative clearance - Per the Revised Cardiac Risk Index for Pre-Operative Risk, patient has 0.9% risk of major cardiac event with surgery.  - COPD is stable on dulera and proair with prn albuterol.  - Placed referral to cardiology for pre-op clearance as requested. Will need to fax results to Dr. Wilburn Cornelia at [phone] once patient has seen cardiology. (Pt requests Dr. Gwenlyn Found and prefers afternoons.) - Ordered Hgb A1c and CMP, as had been over a year since performed  DJD (degenerative joint disease) of knee - Performed steroid knee injection 03/20/16. - Reviewed [place] for controlled substances and had 0 controlled substances dispensed in the last 6 months. Will obtain UDS, as it has almost been 1 year since last tested.  - Filled out [facility] pain contract 01/2015 - Prescribed norco 5-325, #45, with instructions to take BID prn - Gave strict return precautions for worsening pain, development of erythema/fevers  Follow-up after sinus surgery.  Olene Floss, MD [facility],  PGY-2

## 2016-03-20 NOTE — Assessment & Plan Note (Addendum)
-   Performed steroid knee injection 03/20/16. - Reviewed [name] for controlled substances and had 0 controlled substances dispensed in the last 6 months. Will obtain UDS, as it has almost been 1 year since last tested.  - Filled out [facility] pain contract 01/2015 - Prescribed norco 5-325, #45, with instructions to take BID prn - Gave strict return precautions for worsening pain, development of erythema/fevers

## 2016-03-20 NOTE — Assessment & Plan Note (Addendum)
-   Per the Revised Cardiac Risk Index for Pre-Operative Risk, patient has 0.9% risk of major cardiac event with surgery.  - COPD is stable on dulera and proair with prn albuterol.  - Placed referral to cardiology for pre-op clearance as requested. Will need to fax results to Dr. Wilburn Cornelia at [phone] once patient has seen cardiology. (Pt requests Dr. Gwenlyn Found and prefers afternoons.) - Ordered Hgb A1c and CMP, as had been over a year since performed

## 2016-03-20 NOTE — Patient Instructions (Signed)
Ms. Janicke,  Dennis Bast will get a call about the cardiology referral.  I will call you with lab results.  I hope to follow-up with you after your sinus surgery.  Best, Dr. Ola Spurr

## 2016-03-21 LAB — PAIN MGMT, PROFILE 4 CONF W/O MM, U
Amphetamines: NEGATIVE ng/mL (ref <500)
BENZODIAZEPINES: NEGATIVE ng/mL (ref <100)
Barbiturates: NEGATIVE ng/mL (ref <300)
COCAINE METABOLITE: NEGATIVE ng/mL (ref <150)
CREATININE: 48.7 mg/dL (ref >=20.0)
Methadone Metabolite: NEGATIVE ng/mL (ref <100)
OPIATES: NEGATIVE ng/mL (ref <100)
OXIDANT: NEGATIVE ug/mL (ref <200)
Oxycodone: NEGATIVE ng/mL (ref <100)
Phencyclidine: NEGATIVE ng/mL (ref <25)
pH: 6.03 (ref 4.5–9.0)

## 2016-03-21 LAB — HIV ANTIBODY (ROUTINE TESTING W REFLEX): HIV: NONREACTIVE

## 2016-03-21 LAB — HEPATITIS C ANTIBODY: HCV AB: NEGATIVE

## 2016-03-24 MED ORDER — METHYLPREDNISOLONE ACETATE 40 MG/ML IJ SUSP
40.0000 mg | Freq: Once | INTRAMUSCULAR | Status: AC
  Administered 2016-03-20: 40 mg via INTRAMUSCULAR

## 2016-03-24 NOTE — Addendum Note (Signed)
Addended by: Valerie Roys on: 03/24/2016 08:09 AM   Modules accepted: Orders

## 2016-03-28 ENCOUNTER — Ambulatory Visit: Payer: Medicaid Other | Admitting: Internal Medicine

## 2016-03-31 ENCOUNTER — Telehealth: Payer: Self-pay | Admitting: Cardiovascular Disease

## 2016-04-07 NOTE — Telephone Encounter (Signed)
Closed Encounter  °

## 2016-04-09 ENCOUNTER — Ambulatory Visit (INDEPENDENT_AMBULATORY_CARE_PROVIDER_SITE_OTHER): Payer: Medicaid Other | Admitting: Cardiovascular Disease

## 2016-04-09 ENCOUNTER — Encounter: Payer: Self-pay | Admitting: *Deleted

## 2016-04-09 ENCOUNTER — Encounter: Payer: Self-pay | Admitting: Cardiovascular Disease

## 2016-04-09 VITALS — BP 102/60 | HR 69 | Ht 61.0 in | Wt 175.4 lb

## 2016-04-09 DIAGNOSIS — E785 Hyperlipidemia, unspecified: Secondary | ICD-10-CM

## 2016-04-09 DIAGNOSIS — Z01818 Encounter for other preprocedural examination: Secondary | ICD-10-CM

## 2016-04-09 DIAGNOSIS — I739 Peripheral vascular disease, unspecified: Secondary | ICD-10-CM

## 2016-04-09 NOTE — Assessment & Plan Note (Signed)
The patient complains of bilateral calf claudication. She has had a right total knee replacement. I can palpate pedal pulses. Given her risk factors I am going to get lower extremity arterial Doppler studies to rule out a vascular component.

## 2016-04-09 NOTE — Progress Notes (Signed)
04/09/2016 [place]   1956-01-19  [id number]  Primary Physician Ramiro Harvest Torrie Mayers, MD Primary Cardiologist: Lorretta Harp MD Lupe Carney, [place]  HPI:  Miss Tell is a very pleasant 60 year old mildly overweight married Caucasian female mother of 2 children whose husband, [name], is also a patient of mine. She was referred for preoperative clearance before elective outpatient sinus surgery. Risk factors include 40 pack years of tobacco continuing to smoke one pack per day with COPD. She has treated hyperlipidemia as well. She is stable because of a history of epilepsy and COPD she has never had a heart attack or stroke. 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 I performed Cardiolite stress testing on her in 2005 which is normal and she had stress testing as well/7/14 which was normal. 2-D echo was normal at that time as well without evidence of pulmonary hypertension or valvular heart disease.   Current Outpatient Prescriptions  Medication Sig Dispense Refill  . albuterol (PROVENTIL) (2.5 MG/3ML) 0.083% nebulizer solution Take 3 mLs (2.5 mg total) by nebulization every 6 (six) hours as needed. For shortness of breath (Patient taking differently: Take 2.5 mg by nebulization every 4 (four) hours as needed for wheezing or shortness of breath (((PLAN C))). For shortness of breath) 75 mL 6  . aspirin EC 81 MG tablet Take 1 tablet (81 mg total) by mouth daily. (Patient taking differently: Take 81 mg by mouth every morning. )    . calcium citrate (CALCITRATE - DOSED IN MG ELEMENTAL CALCIUM) 950 MG tablet Take 1 tablet (200 mg of elemental calcium total) by mouth 2 (two) times daily. 60 tablet 5  . cholecalciferol (VITAMIN D) 400 units TABS tablet Take 2 tablets (800 Units total) by mouth daily. 60 each 5  . DULERA 200-5 MCG/ACT AERO INHALE 2 PUFFS INTO THE LUNGS TWO TIMES DAILY 13 g 0  . ferrous sulfate 325 (65 FE) MG tablet  Take 1 tablet (325 mg total) by mouth 2 (two) times daily with a meal. (Patient taking differently: Take 325 mg by mouth daily with breakfast. ) 60 tablet 3  . fluticasone (FLONASE) 50 MCG/ACT nasal spray Place 2 sprays into both nostrils daily as needed. For congestion. 16 g 5  . HYDROcodone-acetaminophen (NORCO) 5-325 MG tablet Take 1 tablet by mouth 2 (two) times daily as needed for moderate pain or severe pain. 45 tablet 0  . lamoTRIgine (LAMICTAL) 200 MG tablet Take 1 tablet (200 mg total) by mouth 2 (two) times daily. 60 tablet 6  . loratadine (CLARITIN) 10 MG tablet Take 10 mg by mouth every morning.   5  . meloxicam (MOBIC) 7.5 MG tablet Take 1 tablet (7.5 mg total) by mouth daily. 30 tablet 0  . NON FORMULARY Use CPAP at bedtime    . pantoprazole (PROTONIX) 40 MG tablet Take 1 tablet (40 mg total) by mouth 2 (two) times daily before a meal. (Patient taking differently: Take 40 mg by mouth daily before breakfast. ) 60 tablet 5  . PROAIR HFA 108 (90 Base) MCG/ACT inhaler INHALE 2 PUFFS INTO THE LUNGS EVERY 6 HOURS AS NEEDED FOR SHORTNESS OF BREATH 8.5 g 0  . sertraline (ZOLOFT) 50 MG tablet TAKE 1 TABLET BY MOUTH DAILY. 30 tablet 0  . simvastatin (ZOCOR) 40 MG tablet TAKE 1 TABLET BY MOUTH AT BEDTIME 30 tablet 0  . topiramate (TOPAMAX) 100 MG tablet 1tab in the am,  2 at bedtime 270 tablet 1   No current facility-administered medications for this visit.     Allergies  Allergen Reactions  . Amoxicillin Anaphylaxis    Breakout, mouth swells  . Azithromycin Swelling and Rash    "swelling all over, high, thrush"  . Doxycycline Hyclate Anaphylaxis and Rash    swelling  . Levofloxacin Anaphylaxis  . Penicillins Shortness Of Breath and Rash    "almost died, ended up in hospital"  . Sulfonamide Derivatives Shortness Of Breath, Swelling and Rash    "break out from head to toe"  . Chantix [Varenicline] Other (See Comments)    "bad dreams, difficulty breathing"  . Clarithromycin Hives     swelling  . Nortriptyline Other (See Comments)    Per patient had "kidney problems" on this medication, could not use bathroom (urinary retention), swelling and breakout  . Codeine Nausea And Vomiting  . Triptans     Mouth breaks out and start itching  . Fluticasone-Salmeterol Other (See Comments)    REACTION: ulcers in mouth    Social History   Social History  . Marital status: Married    Spouse name: [name]  . Number of children: 2  . Years of education: 12   Occupational History  . disabled    Social History Main Topics  . Smoking status: Current Every Day Smoker    Packs/day: 1.00    Years: 36.00    Types: Cigarettes    Start date: 08/12/1975  . Smokeless tobacco: Former Systems developer    Quit date: 08/23/2012     Comment: started back smoking in 11/2012  . Alcohol use No  . Drug use: No  . Sexual activity: No   Other Topics Concern  . Not on file   Social History Narrative   Patient lives at home with husband [name], her son and his wife.    Patient has 2 children.    Patient has a high school education.    Patient is currently unemployed.    Patient is right handed.    Patient drinks 2 cups of caffeine per day.        Review of Systems: General: negative for chills, fever, night sweats or weight changes.  Cardiovascular: negative for chest pain, dyspnea on exertion, edema, orthopnea, palpitations, paroxysmal nocturnal dyspnea or shortness of breath Dermatological: negative for rash Respiratory: negative for cough or wheezing Urologic: negative for hematuria Abdominal: negative for nausea, vomiting, diarrhea, bright red blood per rectum, melena, or hematemesis Neurologic: negative for visual changes, syncope, or dizziness All other systems reviewed and are otherwise negative except as noted above.    Blood pressure 102/60, pulse 69, height 5\' 1"  (1.549 m), weight 175 lb 6.4 oz (79.6 kg).  General appearance: alert and no distress Neck: no adenopathy, no carotid bruit,  no JVD, supple, symmetrical, trachea midline and thyroid not enlarged, symmetric, no tenderness/mass/nodules Lungs: clear to auscultation bilaterally Heart: regular rate and rhythm, S1, S2 normal, no murmur, click, rub or gallop Extremities: extremities normal, atraumatic, no cyanosis or edema  EKG normal sinus rhythm of 69 without ST or T-wave changes. I personally reviewed this EKG  ASSESSMENT AND PLAN:   Pre-operative clearance Deavian presents today for preoperative clearance for elective sinus surgery scheduled to be performed by Dr. Claiborne Rigg. She has a history of 40-pack-years of tobacco abuse and treated hyperlipidemia. She's had multiple stress tests in the past including one in 2005 which I performed that was normal and when that was performed 11/15/12  which was normal. She does get short of breath probably related to COPD but denies chest pain. The procedure is low risk and outpatient. I do not think she needs further functional testing for clearance. She will be cleared at low cardiovascular risk.  Hyperlipidemia History of hyperlipidemia on statin therapy followed by her PCP  Claudication [facility]) The patient complains of bilateral calf claudication. She has had a right total knee replacement. I can palpate pedal pulses. Given her risk factors I am going to get lower extremity arterial Doppler studies to rule out a vascular component.      Lorretta Harp MD FACP,FACC,FAHA, [facility] 04/09/2016 8:51 AM

## 2016-04-09 NOTE — Patient Instructions (Signed)
Medication Instructions:  Your physician recommends that you continue on your current medications as directed. Please refer to the Current Medication list given to you today.   Testing/Procedures: Your physician has requested that you have a lower extremity arterial doppler- During this test, ultrasound is used to evaluate arterial blood flow in the legs. Allow approximately one hour for this exam.    Follow-Up: Your physician recommends that you schedule a follow-up appointment ON AN AS NEEDED BASIS.  You are cleared at low cardiovascular risk for your upcoming surgery.   Any Other Special Instructions Will Be Listed Below (If Applicable).     If you need a refill on your cardiac medications before your next appointment, please call your pharmacy.

## 2016-04-09 NOTE — Assessment & Plan Note (Signed)
[name] presents today for preoperative clearance for elective sinus surgery scheduled to be performed by Dr. Claiborne Rigg. She has a history of 40-pack-years of tobacco abuse and treated hyperlipidemia. She's had multiple stress tests in the past including one in 2005 which I performed that was normal and when that was performed 11/15/12 which was normal. She does get short of breath probably related to COPD but denies chest pain. The procedure is low risk and outpatient. I do not think she needs further functional testing for clearance. She will be cleared at low cardiovascular risk.

## 2016-04-09 NOTE — Assessment & Plan Note (Signed)
History of hyperlipidemia on statin therapy followed by her PCP. 

## 2016-04-10 ENCOUNTER — Other Ambulatory Visit: Payer: Self-pay | Admitting: Cardiovascular Disease

## 2016-04-10 DIAGNOSIS — I739 Peripheral vascular disease, unspecified: Secondary | ICD-10-CM

## 2016-04-11 ENCOUNTER — Telehealth: Payer: Self-pay | Admitting: *Deleted

## 2016-04-11 ENCOUNTER — Other Ambulatory Visit: Payer: Self-pay | Admitting: Internal Medicine

## 2016-04-11 DIAGNOSIS — Z1231 Encounter for screening mammogram for malignant neoplasm of breast: Secondary | ICD-10-CM

## 2016-04-11 NOTE — Telephone Encounter (Signed)
Clearance letter from [facility] for upcoming sinus surgery faxed to [phone] successfully.

## 2016-04-15 ENCOUNTER — Ambulatory Visit (INDEPENDENT_AMBULATORY_CARE_PROVIDER_SITE_OTHER): Payer: Medicaid Other | Admitting: *Deleted

## 2016-04-15 DIAGNOSIS — Z23 Encounter for immunization: Secondary | ICD-10-CM

## 2016-04-16 ENCOUNTER — Ambulatory Visit: Payer: Medicaid Other | Admitting: Internal Medicine

## 2016-04-18 ENCOUNTER — Other Ambulatory Visit: Payer: Self-pay | Admitting: Internal Medicine

## 2016-04-22 ENCOUNTER — Inpatient Hospital Stay (HOSPITAL_COMMUNITY): Admission: RE | Admit: 2016-04-22 | Payer: Medicaid Other | Source: Ambulatory Visit

## 2016-04-25 ENCOUNTER — Ambulatory Visit: Payer: Medicaid Other

## 2016-05-02 ENCOUNTER — Ambulatory Visit: Payer: Medicaid Other

## 2016-05-07 ENCOUNTER — Ambulatory Visit: Payer: Medicaid Other

## 2016-05-08 ENCOUNTER — Other Ambulatory Visit: Payer: Self-pay | Admitting: Otolaryngology

## 2016-05-09 ENCOUNTER — Telehealth: Payer: Self-pay | Admitting: Cardiovascular Disease

## 2016-05-09 NOTE — Telephone Encounter (Signed)
.  CH

## 2016-05-12 ENCOUNTER — Ambulatory Visit (INDEPENDENT_AMBULATORY_CARE_PROVIDER_SITE_OTHER): Payer: Medicaid Other | Admitting: Internal Medicine

## 2016-05-12 ENCOUNTER — Encounter: Payer: Self-pay | Admitting: Internal Medicine

## 2016-05-12 VITALS — BP 104/64 | HR 106 | Ht 61.0 in | Wt 176.4 lb

## 2016-05-12 DIAGNOSIS — F17210 Nicotine dependence, cigarettes, uncomplicated: Secondary | ICD-10-CM | POA: Diagnosis not present

## 2016-05-12 DIAGNOSIS — J449 Chronic obstructive pulmonary disease, unspecified: Secondary | ICD-10-CM | POA: Diagnosis not present

## 2016-05-12 NOTE — Progress Notes (Signed)
Subjective:    Patient ID: [name], female    DOB: Sep 09, 1955 MRN: [id number]  HPI  30 yowf active smoker with dx of copd aware of sob around 2000 [facility] with exacerbation every few months just [facility] in 04/2012 for ? CAP with aecopd referred by Dr Para March for preop for R TKR Jan 13 14   06/18/2012 Pulmonary preop cc doe x one aisle and since last flare using inhaler saba maybe twice weekly and sleeping ok on cpap maintained on spiriva and can't take advair due to mouth irritation.  Min am cough/ congestion with thick white mucus.  rec Try to minimize smoking Try tudorza one puff twice daily instead of spiriva on trial basis for 2 weeks - call if you like it better and we'll send you a prescription     08/06/2012 f/u ov/Leanette Eutsler still smoking cc not limitedby breathing but by knees,  thinks tudorza works better but only using once a day. >>no changes   09/10/2012 [facility] follow up  Patient presents for a post hospital followup. She was admitted January 13-27, for elective right total knee replacement. She had postop complications with hypoxia. CT chest revealed diffuse bilateral patchy airspace disease. It was felt. Patient had possible intraoperative aspiration versus bacterial pneumonia with compounding edema. Autoimmune workup was essentially negative. She was treated with aggressive antibiotics, IV diuresis, nebulized bronchodilators, and IV steroids. She was encouraged on smoking cessation. She was discharged on oxygen therapy with activity. At 2 L. Since discharge. Patient reports that she is feeling much improved with decreased shortness of breath. She denies any cough, fever, or hemoptysis, orthopnea, PND, or increased leg swelling. She has not smoked since January 13. She was congratulated on smoking cessation. Chest x-ray today does show improved aeration. rec Keep up good work with not smoking  Continue on current regimen.  Wear O2 with activity and  As needed   Follow up Dr. Melvyn Novas  In 2 weeks and As needed   > did not return as requested    08/23/2015  f/u ov/Andrena Margerum re: active smoking/ AB  Chief Complaint  Patient presents with  . Follow-up    Pt c/o increased cough and SOB for the past 6-12 months. She states that she is SOB with or without and exertion and is using abluterol inhaler and albuterol nebs 2-3 x per day. Her cough is prod with large amounts of green sputum.    Cough> sob >>> No better on prednisone / spiriva mdi and dulera 200 2bid Mucus is mostly discolored in am's and nonprod the rest of the day rec Stop spiriva  omnicef 300 twice daily x 10 days - call for sinus Ct if you still have green mucus and ask for [facility] to order it [phone]) For cough > mucinex dm up to 1200 mg every 12 hours and use the flutter valve Work on inhaler technique:  GERD See Tammy NP w/in 2 weeks    11/05/2015 NP  Follow up : AB /smoker  Pt returns for 2 month follow up and med review  Pt had bronchitis last ov , tx w/ omnicef . Did get slightly better but  Symptoms returned with increased cough .   Still smoking , 1 ppd , smoking cessation encouraged.  rec We are setting you up for a CT sinus and chest (HR)  Follow med calendar closely and bring to each visit.  Work on not smoking.      05/12/2016  f/u ov/Elvina Bosch re:  preop eval for sinus surgery / no med calendar  Chief Complaint  Patient presents with  . Follow-up    Pt. says she is not having any sob, coughing with green phlegm, no chest pain  very poor insight into meds / still smoking/ main problem is am cough/ congestion not using mucinex / flutter as rec   Doe = MMRC2 = can't walk a nl pace on a flat grade s sob but does fine slow and flat eg shopping  No obvious day to day or daytime variability or assoc   or cp or chest tightness, subjective wheeze or overt sinus or hb symptoms. No unusual exp hx or h/o childhood pna/ asthma or knowledge of premature birth.  Sleeping ok  without nocturnal  or early am exacerbation  of respiratory  c/o's or need for noct saba. Also denies any obvious fluctuation of symptoms with weather or environmental changes or other aggravating or alleviating factors except as outlined above   Current Medications, Allergies, Complete Past Medical History, Past Surgical History, Family History, and Social History were reviewed in [employer] record.  ROS  The following are not active complaints unless bolded sore throat, dysphagia, dental problems, itching, sneezing,  nasal congestion or excess/ purulent secretions, ear ache,   fever, chills, sweats, unintended wt loss, classically pleuritic or exertional cp, hemoptysis,  orthopnea pnd or leg swelling, presyncope, palpitations, abdominal pain, anorexia, nausea, vomiting, diarrhea  or change in bowel or bladder habits, change in stools or urine, dysuria,hematuria,  rash, arthralgias, visual complaints, headache, numbness, weakness or ataxia or problems with walking or coordination,  change in mood/affect or memory.             Objective:   Physical Exam  amb obese wf nad/ vital signs reviewed  - sats 97% RA on arrival    08/06/2012  185 >177 09/10/2012 > 08/23/2015 180 >  05/12/2016  176    HEENT mild turbinate edema.  Oropharynx no thrush or excess pnd or cobblestoning.  No JVD or cervical adenopathy. Mild accessory muscle hypertrophy. Trachea midline, nl thryroid. Chest was slt hyperinflated by percussion with diminished breath sounds and mild  increased exp time  and mid exp bilateral junky rhonchi. Hoover sign positive at end inspiration. Regular rate and rhythm without murmur gallop or rub or increase P2 or edema.  Abd: no hsm, nl excursion. Ext warm without cyanosis or clubbing.            Assessment & Plan:

## 2016-05-12 NOTE — Assessment & Plan Note (Signed)
>   3 min Discussed the risks and costs (both direct and indirect)  of smoking relative to the benefits of quitting but patient unwilling to commit at this point to a specific quit date.    Although I don't endorse regular use of e cigs/ many pts find them helpful; however, I emphasized they should be considered a "one-way bridge" off all tobacco products.  

## 2016-05-12 NOTE — Patient Instructions (Signed)
Work on inhaler technique:  relax and gently blow all the way out then take a nice smooth deep breath back in, triggering the inhaler at same time you start breathing in.  Hold for up to 5 seconds if you can. Blow out thru nose. Rinse and gargle with water when done     Plan A = Automatic = dulera 200 Take 2 puffs first thing in am and then another 2 puffs about 12 hours later.      Plan B = Backup Only use your albuterol (proair) as a rescue medication to be used if you can't catch your breath by resting or doing a relaxed purse lip breathing pattern.  - The less you use it, the better it will work when you need it. - Ok to use the inhaler up to 2 puffs  every 4 hours if you must but call for appointment if use goes up over your usual need - Don't leave home without it !!  (think of it like the spare tire for your car)   Plan C = Crisis - only use your albuterol nebulizer if you first try Plan B and it fails to help > ok to use the nebulizer up to every 4 hours but if start needing it regularly call for immediate appointment   Plan D = Doctor - call me if B and C not adequate  Plan E = ER - go to ER or call 911 if all else fails     The key is to stop smoking completely before smoking completely stops you!   Pulmonary follow up is as needed - bring your medications with you

## 2016-05-12 NOTE — Assessment & Plan Note (Addendum)
-   PFT's 08/06/2012 FEV1  1.60 (74%) ratio 70  And no better p B2,  DLCO 48% corrects to 110 - 08/23/2015  extensive coaching HFA effectiveness =    75% try on dulera 200/ off spiriva  -10/2015 >CT chest and sinus >CT chest some mild scarring in upper lungs probably from previous infection in past.  No diffuse scarring or fibroiss . No ILD . CT sinus shows very severe chronic sinus dz on the right >refer to ENT> shoemaker plans surgery 05/2016    - The proper method of use, as well as anticipated side effects, of a metered-dose inhaler are discussed and demonstrated to the patient. Improved effectiveness after extensive coaching during this visit to a level of approximately 50 % from a baseline of 5 % > continue dulera 200 2bid  Luckily does not have any sign copd but AB driven by cigs/ sinus dz   Should be ok for surgery if uses neb preop/ pulmonar f/u can be prn  I had an extended final summary discussion with the patient reviewing all relevant studies completed to date and  lasting 15 to 20 minutes of a 25 minute visit on the following issues:      Each maintenance medication was reviewed in detail including most importantly the difference between maintenance and as needed and under what circumstances the prns are to be used. This was done in the context of a medication calendar review which provided the patient with a user-friendly unambiguous mechanism for medication administration and reconciliation and provides an action plan for all active problems. It is critical that this be shown to every doctor  for modification during the office visit if necessary so the patient can use it as a working document.

## 2016-05-13 ENCOUNTER — Other Ambulatory Visit: Payer: Self-pay | Admitting: Otolaryngology

## 2016-05-14 ENCOUNTER — Ambulatory Visit (HOSPITAL_COMMUNITY)
Admission: RE | Admit: 2016-05-14 | Discharge: 2016-05-14 | Disposition: A | Discharge status: Home / Self Care | Payer: Medicaid Other | Source: Ambulatory Visit | Attending: Cardiovascular Disease | Admitting: Cardiovascular Disease

## 2016-05-14 ENCOUNTER — Other Ambulatory Visit: Payer: Self-pay | Admitting: Nurse Practitioner

## 2016-05-14 DIAGNOSIS — E785 Hyperlipidemia, unspecified: Secondary | ICD-10-CM | POA: Insufficient documentation

## 2016-05-14 DIAGNOSIS — I739 Peripheral vascular disease, unspecified: Secondary | ICD-10-CM | POA: Diagnosis not present

## 2016-05-14 DIAGNOSIS — Z720 Tobacco use: Secondary | ICD-10-CM | POA: Diagnosis not present

## 2016-05-14 DIAGNOSIS — J449 Chronic obstructive pulmonary disease, unspecified: Secondary | ICD-10-CM | POA: Diagnosis not present

## 2016-05-14 NOTE — Pre-Procedure Instructions (Signed)
[name]  05/14/2016     Your procedure is scheduled on : Friday May 23, 2016 at 7:30 AM.  Report to [facility] Admitting at 5:30 AM.  Call this number if you have problems the morning of surgery: [phone]    Remember:  Do not eat food or drink liquids after midnight.  Take these medicines the morning of surgery with A SIP OF WATER : Acetaminophen (Tylenol) if needed, Albuterol nebulizer if needed, Dulera inhaler, Flonase nasal spray, Lamotrigine (Lamictal), Loratadine (Claritin), Pantoprazole (Protonix), Proair inhaler, Sertraline (Zoloft), Topiramate (Topamax)   Stop taking any vitamins, herbal medications/supplements, NSAIDs, Ibuprofen, Advil, Motrin, Aleve, Meloxicam/Mobic etc on Friday October 6th   Do not wear jewelry, make-up or nail polish.  Do not wear lotions, powders, or perfumes, or deoderant.  Do not shave 48 hours prior to surgery.  Men may shave face and neck.  Do not bring valuables to the hospital.  [facility] is not responsible for any belongings or valuables.  Contacts, dentures or bridgework may not be worn into surgery.  Leave your suitcase in the car.  After surgery it may be brought to your room.  For patients admitted to the hospital, discharge time will be determined by your treatment team.  Patients discharged the day of surgery will not be allowed to drive home.   Name and phone number of your driver:    Special instructions:  Shower using CHG soap the night before and the morning of your surgery  Please read over the following fact sheets that you were given.

## 2016-05-15 ENCOUNTER — Ambulatory Visit: Payer: Medicaid Other | Admitting: Nurse Practitioner

## 2016-05-15 ENCOUNTER — Other Ambulatory Visit: Payer: Self-pay | Admitting: Internal Medicine

## 2016-05-15 ENCOUNTER — Other Ambulatory Visit: Payer: Self-pay | Admitting: Neurology

## 2016-05-15 ENCOUNTER — Encounter (HOSPITAL_COMMUNITY): Payer: Self-pay

## 2016-05-15 ENCOUNTER — Encounter (HOSPITAL_COMMUNITY)
Admission: RE | Admit: 2016-05-15 | Discharge: 2016-05-15 | Disposition: A | Discharge status: Home / Self Care | Payer: Medicaid Other | Source: Ambulatory Visit | Attending: Otolaryngology | Admitting: Otolaryngology

## 2016-05-15 DIAGNOSIS — J329 Chronic sinusitis, unspecified: Secondary | ICD-10-CM | POA: Insufficient documentation

## 2016-05-15 DIAGNOSIS — Z01812 Encounter for preprocedural laboratory examination: Secondary | ICD-10-CM | POA: Insufficient documentation

## 2016-05-15 DIAGNOSIS — Z01818 Encounter for other preprocedural examination: Secondary | ICD-10-CM | POA: Diagnosis not present

## 2016-05-15 LAB — CBC
HCT: 37.1 % (ref 36.0–46.0)
Hemoglobin: 12 g/dL (ref 12.0–15.0)
MCH: 31.1 pg (ref 26.0–34.0)
MCHC: 32.3 g/dL (ref 30.0–36.0)
MCV: 96.1 fL (ref 78.0–100.0)
PLATELETS: 204 10*3/uL (ref 150–400)
RBC: 3.86 MIL/uL — AB (ref 3.87–5.11)
RDW: 14 % (ref 11.5–15.5)
WBC: 10.7 10*3/uL — ABNORMAL HIGH (ref 4.0–10.5)

## 2016-05-15 LAB — BASIC METABOLIC PANEL
Anion gap: 7 (ref 5–15)
BUN: 12 mg/dL (ref 6–20)
CHLORIDE: 110 mmol/L (ref 101–111)
CO2: 24 mmol/L (ref 22–32)
Calcium: 9.2 mg/dL (ref 8.9–10.3)
Creatinine, Ser: 1 mg/dL (ref 0.44–1.00)
GFR calc Af Amer: >60 mL/min (ref >60)
GLUCOSE: 90 mg/dL (ref 65–99)
POTASSIUM: 3.7 mmol/L (ref 3.5–5.1)
Sodium: 141 mmol/L (ref 135–145)

## 2016-05-15 MED ORDER — CHLORHEXIDINE GLUCONATE CLOTH 2 % EX PADS: 6.0000 | MEDICATED_PAD | Freq: Once | CUTANEOUS | Status: DC

## 2016-05-15 NOTE — Telephone Encounter (Signed)
Medication is not on pt's current med list. Pt has appt w/ Hoyle Sauer NP on Monday 05/19/16. Unsure if she plans on continuing/reordering Seroquel.

## 2016-05-15 NOTE — Progress Notes (Signed)
PCP is Fredonia Highland  Cardiologist is Quay Burow. LOV was 2 weeks ago  Pulmonologist is Christinia Gully. LOV was on 05/13/16  Patient denied having any current cardiac or pulmonary issues. Patient informed Nurse that she last used Albuterol nebulizer "two days ago."  Patient stated she has sleep apnea, but has not been able to use CPAP machine because she has been having "sinus issues."  Will send chart to Anesthesia for review.

## 2016-05-19 ENCOUNTER — Ambulatory Visit: Payer: Medicaid Other | Admitting: Nurse Practitioner

## 2016-05-22 MED ORDER — DEXAMETHASONE SODIUM PHOSPHATE 10 MG/ML IJ SOLN
10.0000 mg | Freq: Once | INTRAMUSCULAR | Status: AC
  Administered 2016-05-23: 10 mg via INTRAVENOUS
  Filled 2016-05-22: qty 1

## 2016-05-22 NOTE — Anesthesia Preprocedure Evaluation (Addendum)
Anesthesia Evaluation  Patient identified by MRN, date of birth, ID band Patient awake    Reviewed: Allergy & Precautions, NPO status , Patient's Chart, lab work & pertinent test results  History of Anesthesia Complications Negative for: history of anesthetic complications  Airway Mallampati: III  TM Distance: >3 FB    Comment: Previous grade I view with MAC 4 Dental  (+) Upper Dentures, Lower Dentures, Dental Advisory Given   Pulmonary sleep apnea (has not been using CPAP due to sinusitis) , COPD (used Dulera and albuterol this morning),  COPD inhaler, Current Smoker,    Pulmonary exam normal breath sounds clear to auscultation       Cardiovascular Exercise Tolerance: Poor (-) hypertension(-) angina+CHF (diastolic), + DOE and + DVT (1982, right leg)  (-) Past MI, (-) Cardiac Stents and (-) CABG (-) dysrhythmias  Rhythm:Regular Rate:Normal  H/o Pulmonary HTN, HLD  EKG 04/09/2016: NSR  TTE 08/06/2014: Study Conclusions - Left ventricle: The cavity size was normal. Wall thickness was normal. Systolic function was normal. The estimated ejection fraction was in the range of 55% to 60%. Wall motion was normal; there were no regional wall motion abnormalities. Doppler parameters are consistent with abnormal left ventricular relaxation (grade 1 diastolic dysfunction). - Atrial septum: There was an atrial septal aneurysm.  Stress Test 11/15/2012: Normal   Neuro/Psych  Headaches, Seizures - (on lamotrigine, last seizure 8 months ago), Well Controlled,  PSYCHIATRIC DISORDERS Anxiety Depression Vertigo, neck pain, low back pain    GI/Hepatic Neg liver ROS, hiatal hernia, GERD  Medicated and Controlled,IBS, diverticulosis, esophageal stricture   Endo/Other  diabetes (not on medication), Type 2  Renal/GU negative Renal ROS     Musculoskeletal  (+) Arthritis , Osteoarthritis,  DJD   Abdominal (+) + obese,   Peds  Hematology  (+)  Blood dyscrasia, anemia ,   Anesthesia Other Findings Allergic rhinitis, glaucoma  Reproductive/Obstetrics                            Anesthesia Physical Anesthesia Plan  ASA: III  Anesthesia Plan: General   Post-op Pain Management:    Induction: Intravenous  Airway Management Planned: Oral ETT  Additional Equipment:   Intra-op Plan:   Post-operative Plan: Extubation in OR  Informed Consent: I have reviewed the patients History and Physical, chart, labs and discussed the procedure including the risks, benefits and alternatives for the proposed anesthesia with the patient or authorized representative who has indicated his/her understanding and acceptance.   Dental advisory given  Plan Discussed with: CRNA  Anesthesia Plan Comments: (Risks of general anesthesia discussed including, but not limited to, sore throat, hoarse voice, chipped/damaged teeth, injury to vocal cords, nausea and vomiting, allergic reactions, lung infection, heart attack, stroke, and death. All questions answered. )       Anesthesia Quick Evaluation

## 2016-05-23 ENCOUNTER — Encounter (HOSPITAL_COMMUNITY): Payer: Self-pay | Admitting: Urology

## 2016-05-23 ENCOUNTER — Ambulatory Visit (HOSPITAL_COMMUNITY)
Admission: RE | Admit: 2016-05-23 | Discharge: 2016-05-23 | Disposition: A | Discharge status: Home / Self Care | Payer: Medicaid Other | Source: Ambulatory Visit | Attending: Otolaryngology | Admitting: Otolaryngology

## 2016-05-23 ENCOUNTER — Encounter (HOSPITAL_COMMUNITY)
Admission: RE | Disposition: A | Discharge status: Home / Self Care | Payer: Self-pay | Source: Ambulatory Visit | Attending: Otolaryngology

## 2016-05-23 ENCOUNTER — Ambulatory Visit (HOSPITAL_COMMUNITY): Payer: Medicaid Other | Admitting: Anesthesiology

## 2016-05-23 DIAGNOSIS — J342 Deviated nasal septum: Secondary | ICD-10-CM | POA: Diagnosis present

## 2016-05-23 DIAGNOSIS — Z7982 Long term (current) use of aspirin: Secondary | ICD-10-CM | POA: Diagnosis not present

## 2016-05-23 DIAGNOSIS — J343 Hypertrophy of nasal turbinates: Secondary | ICD-10-CM | POA: Diagnosis not present

## 2016-05-23 DIAGNOSIS — J449 Chronic obstructive pulmonary disease, unspecified: Secondary | ICD-10-CM | POA: Insufficient documentation

## 2016-05-23 DIAGNOSIS — Z79899 Other long term (current) drug therapy: Secondary | ICD-10-CM | POA: Insufficient documentation

## 2016-05-23 DIAGNOSIS — F17210 Nicotine dependence, cigarettes, uncomplicated: Secondary | ICD-10-CM | POA: Diagnosis not present

## 2016-05-23 DIAGNOSIS — E119 Type 2 diabetes mellitus without complications: Secondary | ICD-10-CM | POA: Diagnosis not present

## 2016-05-23 DIAGNOSIS — Z86718 Personal history of other venous thrombosis and embolism: Secondary | ICD-10-CM | POA: Diagnosis not present

## 2016-05-23 DIAGNOSIS — Z96651 Presence of right artificial knee joint: Secondary | ICD-10-CM | POA: Insufficient documentation

## 2016-05-23 DIAGNOSIS — F329 Major depressive disorder, single episode, unspecified: Secondary | ICD-10-CM | POA: Diagnosis not present

## 2016-05-23 DIAGNOSIS — Z880 Allergy status to penicillin: Secondary | ICD-10-CM | POA: Diagnosis not present

## 2016-05-23 DIAGNOSIS — E785 Hyperlipidemia, unspecified: Secondary | ICD-10-CM | POA: Insufficient documentation

## 2016-05-23 DIAGNOSIS — I10 Essential (primary) hypertension: Secondary | ICD-10-CM | POA: Insufficient documentation

## 2016-05-23 DIAGNOSIS — J329 Chronic sinusitis, unspecified: Secondary | ICD-10-CM | POA: Insufficient documentation

## 2016-05-23 HISTORY — PX: SINUS ENDO WITH FUSION: SHX5329

## 2016-05-23 HISTORY — PX: NASAL SEPTOPLASTY W/ TURBINOPLASTY: SHX2070

## 2016-05-23 LAB — GLUCOSE, CAPILLARY
GLUCOSE-CAPILLARY: 114 mg/dL — AB (ref 65–99)
GLUCOSE-CAPILLARY: 166 mg/dL — AB (ref 65–99)

## 2016-05-23 SURGERY — SEPTOPLASTY, NOSE, WITH NASAL TURBINATE REDUCTION
Anesthesia: General | Site: Nose | Laterality: Right

## 2016-05-23 MED ORDER — TRIAMCINOLONE ACETONIDE 40 MG/ML IJ SUSP
INTRAMUSCULAR | Status: AC
  Filled 2016-05-23: qty 5

## 2016-05-23 MED ORDER — LACTATED RINGERS IV SOLN
INTRAVENOUS | Status: DC | PRN
  Administered 2016-05-23 (×2): via INTRAVENOUS

## 2016-05-23 MED ORDER — ONDANSETRON HCL 4 MG/2ML IJ SOLN
INTRAMUSCULAR | Status: AC
  Filled 2016-05-23: qty 2

## 2016-05-23 MED ORDER — LIDOCAINE 2% (20 MG/ML) 5 ML SYRINGE
INTRAMUSCULAR | Status: AC
  Filled 2016-05-23: qty 5

## 2016-05-23 MED ORDER — PHENYLEPHRINE 40 MCG/ML (10ML) SYRINGE FOR IV PUSH (FOR BLOOD PRESSURE SUPPORT)
PREFILLED_SYRINGE | INTRAVENOUS | Status: AC
  Filled 2016-05-23: qty 20

## 2016-05-23 MED ORDER — DEXAMETHASONE SODIUM PHOSPHATE 10 MG/ML IJ SOLN: 10.0000 mg | Freq: Once | INTRAMUSCULAR | Status: DC

## 2016-05-23 MED ORDER — FENTANYL CITRATE (PF) 100 MCG/2ML IJ SOLN
INTRAMUSCULAR | Status: AC
  Filled 2016-05-23: qty 4

## 2016-05-23 MED ORDER — SODIUM CHLORIDE 0.9 % IR SOLN
Status: DC | PRN
  Administered 2016-05-23: 1000 mL

## 2016-05-23 MED ORDER — SUGAMMADEX SODIUM 200 MG/2ML IV SOLN
INTRAVENOUS | Status: AC
  Filled 2016-05-23: qty 2

## 2016-05-23 MED ORDER — PROPOFOL 10 MG/ML IV BOLUS
INTRAVENOUS | Status: DC | PRN
  Administered 2016-05-23: 160 mg via INTRAVENOUS

## 2016-05-23 MED ORDER — DIPHENHYDRAMINE HCL 50 MG/ML IJ SOLN
INTRAMUSCULAR | Status: AC
  Filled 2016-05-23: qty 1

## 2016-05-23 MED ORDER — MUPIROCIN CALCIUM 2 % EX CREA
TOPICAL_CREAM | CUTANEOUS | Status: AC
  Filled 2016-05-23: qty 15

## 2016-05-23 MED ORDER — MUPIROCIN CALCIUM 2 % EX CREA
TOPICAL_CREAM | CUTANEOUS | Status: DC | PRN
  Administered 2016-05-23 (×2): 1 via TOPICAL

## 2016-05-23 MED ORDER — VANCOMYCIN HCL 1000 MG IV SOLR
1000.0000 mg | Freq: Two times a day (BID) | INTRAVENOUS | Status: DC
  Administered 2016-05-23: 1000 mg via INTRAVENOUS

## 2016-05-23 MED ORDER — PHENYLEPHRINE HCL 10 MG/ML IJ SOLN
INTRAMUSCULAR | Status: DC | PRN
  Administered 2016-05-23 (×7): 80 ug via INTRAVENOUS

## 2016-05-23 MED ORDER — MIDAZOLAM HCL 2 MG/2ML IJ SOLN
INTRAMUSCULAR | Status: AC
  Filled 2016-05-23: qty 2

## 2016-05-23 MED ORDER — FENTANYL CITRATE (PF) 100 MCG/2ML IJ SOLN: 25.0000 ug | INTRAMUSCULAR | Status: DC | PRN

## 2016-05-23 MED ORDER — LIDOCAINE HCL (CARDIAC) 20 MG/ML IV SOLN
INTRAVENOUS | Status: DC | PRN
  Administered 2016-05-23: 100 mg via INTRATRACHEAL

## 2016-05-23 MED ORDER — OXYMETAZOLINE HCL 0.05 % NA SOLN
NASAL | Status: AC
Start: 2016-05-23 — End: 2016-05-23
  Filled 2016-05-23: qty 15

## 2016-05-23 MED ORDER — PROMETHAZINE HCL 25 MG/ML IJ SOLN: 6.2500 mg | INTRAMUSCULAR | Status: DC | PRN

## 2016-05-23 MED ORDER — LIDOCAINE-EPINEPHRINE 1 %-1:100000 IJ SOLN
INTRAMUSCULAR | Status: AC
  Filled 2016-05-23: qty 1

## 2016-05-23 MED ORDER — ROCURONIUM BROMIDE 100 MG/10ML IV SOLN
INTRAVENOUS | Status: DC | PRN
  Administered 2016-05-23: 30 mg via INTRAVENOUS

## 2016-05-23 MED ORDER — PROPOFOL 10 MG/ML IV BOLUS
INTRAVENOUS | Status: AC
Start: 2016-05-23 — End: 2016-05-23
  Filled 2016-05-23: qty 40

## 2016-05-23 MED ORDER — OXYMETAZOLINE HCL 0.05 % NA SOLN
NASAL | Status: DC | PRN
  Administered 2016-05-23: 1

## 2016-05-23 MED ORDER — 0.9 % SODIUM CHLORIDE (POUR BTL) OPTIME
TOPICAL | Status: DC | PRN
  Administered 2016-05-23: 1000 mL

## 2016-05-23 MED ORDER — TRIAMCINOLONE ACETONIDE 40 MG/ML IJ SUSP
INTRAMUSCULAR | Status: DC | PRN
  Administered 2016-05-23: 40 mg

## 2016-05-23 MED ORDER — MIDAZOLAM HCL 5 MG/5ML IJ SOLN
INTRAMUSCULAR | Status: DC | PRN
  Administered 2016-05-23: 1 mg via INTRAVENOUS

## 2016-05-23 MED ORDER — LIDOCAINE-EPINEPHRINE 1 %-1:100000 IJ SOLN
INTRAMUSCULAR | Status: DC | PRN
  Administered 2016-05-23: 7 mL

## 2016-05-23 MED ORDER — VANCOMYCIN HCL IN DEXTROSE 1-5 GM/200ML-% IV SOLN
INTRAVENOUS | Status: AC
  Filled 2016-05-23: qty 200

## 2016-05-23 MED ORDER — SUGAMMADEX SODIUM 200 MG/2ML IV SOLN
INTRAVENOUS | Status: DC | PRN
  Administered 2016-05-23: 200 mg via INTRAVENOUS

## 2016-05-23 MED ORDER — ROCURONIUM BROMIDE 10 MG/ML (PF) SYRINGE
PREFILLED_SYRINGE | INTRAVENOUS | Status: AC
  Filled 2016-05-23: qty 10

## 2016-05-23 MED ORDER — VANCOMYCIN HCL IN DEXTROSE 1-5 GM/200ML-% IV SOLN: 1000.0000 mg | Freq: Two times a day (BID) | INTRAVENOUS | Status: DC

## 2016-05-23 MED ORDER — HYDROCODONE-ACETAMINOPHEN 5-325 MG PO TABS: 1.0000 | ORAL_TABLET | Freq: Four times a day (QID) | ORAL | 0 refills | Status: AC | PRN

## 2016-05-23 MED ORDER — ALBUTEROL SULFATE HFA 108 (90 BASE) MCG/ACT IN AERS
INHALATION_SPRAY | RESPIRATORY_TRACT | Status: DC | PRN
  Administered 2016-05-23 (×2): 2 via RESPIRATORY_TRACT

## 2016-05-23 MED ORDER — FENTANYL CITRATE (PF) 100 MCG/2ML IJ SOLN
INTRAMUSCULAR | Status: DC | PRN
  Administered 2016-05-23: 100 ug via INTRAVENOUS

## 2016-05-23 MED ORDER — ONDANSETRON HCL 4 MG/2ML IJ SOLN
INTRAMUSCULAR | Status: DC | PRN
  Administered 2016-05-23: 4 mg via INTRAVENOUS

## 2016-05-23 SURGICAL SUPPLY — 55 items
BLADE RAD40 ROTATE 4M 4 5PK (BLADE) ×1 IMPLANT
BLADE RAD40 ROTATE 4M 4MM 5PK (BLADE) ×1
BLADE ROTATE RAD 40 4 M4 (BLADE) IMPLANT
BLADE ROTATE RAD 40 4MM M4 (BLADE)
BLADE ROTATE TRICUT 4MX13CM M4 (BLADE)
BLADE ROTATE TRICUT 4X13 M4 (BLADE) IMPLANT
BLADE SURG 15 STRL LF DISP TIS (BLADE) ×2 IMPLANT
BLADE SURG 15 STRL SS (BLADE) ×4
BLADE TRICUT ROTATE M4 4 5PK (BLADE) ×1 IMPLANT
BLADE TRICUT ROTATE M4 4MM 5PK (BLADE) ×1
CANISTER SUCTION 2500CC (MISCELLANEOUS) ×8 IMPLANT
COAGULATOR SUCT 6 FR SWTCH (ELECTROSURGICAL)
COAGULATOR SUCT 8FR VV (MISCELLANEOUS) IMPLANT
COAGULATOR SUCT SWTCH 10FR 6 (ELECTROSURGICAL) IMPLANT
DRAPE PROXIMA HALF (DRAPES) IMPLANT
ELECT REM PT RETURN 9FT ADLT (ELECTROSURGICAL) ×4
ELECTRODE REM PT RTRN 9FT ADLT (ELECTROSURGICAL) ×2 IMPLANT
FILTER ARTHROSCOPY CONVERTOR (FILTER) ×4 IMPLANT
FLUID NSS /IRRIG 1000 ML XXX (MISCELLANEOUS) ×4 IMPLANT
GAUZE SPONGE 2X2 8PLY STRL LF (GAUZE/BANDAGES/DRESSINGS) ×2 IMPLANT
GLOVE BIOGEL M 7.0 STRL (GLOVE) ×8 IMPLANT
GLOVE BIOGEL PI IND STRL 6.5 (GLOVE) IMPLANT
GLOVE BIOGEL PI IND STRL 8.5 (GLOVE) IMPLANT
GLOVE BIOGEL PI INDICATOR 6.5 (GLOVE) ×4
GLOVE BIOGEL PI INDICATOR 8.5 (GLOVE) ×2
GLOVE ECLIPSE 6.0 STRL STRAW (GLOVE) ×2 IMPLANT
GOWN STRL REUS W/ TWL LRG LVL3 (GOWN DISPOSABLE) ×4 IMPLANT
GOWN STRL REUS W/ TWL XL LVL3 (GOWN DISPOSABLE) IMPLANT
GOWN STRL REUS W/TWL LRG LVL3 (GOWN DISPOSABLE) ×8
GOWN STRL REUS W/TWL XL LVL3 (GOWN DISPOSABLE) ×4
KIT BASIN OR (CUSTOM PROCEDURE TRAY) ×4 IMPLANT
KIT ROOM TURNOVER OR (KITS) ×4 IMPLANT
NDL 18GX1X1/2 (RX/OR ONLY) (NEEDLE) IMPLANT
NDL HYPO 25GX1X1/2 BEV (NEEDLE) ×2 IMPLANT
NEEDLE 18GX1X1/2 (RX/OR ONLY) (NEEDLE) IMPLANT
NEEDLE HYPO 25GX1X1/2 BEV (NEEDLE) ×4 IMPLANT
NS IRRIG 1000ML POUR BTL (IV SOLUTION) ×4 IMPLANT
PAD ARMBOARD 7.5X6 YLW CONV (MISCELLANEOUS) ×6 IMPLANT
PENCIL BUTTON HOLSTER BLD 10FT (ELECTRODE) IMPLANT
SPECIMEN JAR SMALL (MISCELLANEOUS) ×4 IMPLANT
SPLINT NASAL DOYLE BI-VL (GAUZE/BANDAGES/DRESSINGS) ×4 IMPLANT
SPONGE GAUZE 2X2 STER 10/PKG (GAUZE/BANDAGES/DRESSINGS)
SPONGE NEURO XRAY DETECT 1X3 (DISPOSABLE) ×4 IMPLANT
SUT ETHILON 3 0 FSL (SUTURE) ×2 IMPLANT
SUT ETHILON 3 0 PS 1 (SUTURE) ×4 IMPLANT
SUT PLAIN 4 0 ~~LOC~~ 1 (SUTURE) ×4 IMPLANT
SYR CONTROL 10ML LL (SYRINGE) ×4 IMPLANT
TOWEL OR 17X24 6PK STRL BLUE (TOWEL DISPOSABLE) ×4 IMPLANT
TRAP SPECIMEN MUCOUS 40CC (MISCELLANEOUS) ×4 IMPLANT
TRAY ENT MC OR (CUSTOM PROCEDURE TRAY) ×4 IMPLANT
TUBE CONNECTING 12'X1/4 (SUCTIONS) ×1
TUBE CONNECTING 12X1/4 (SUCTIONS) ×3 IMPLANT
TUBE SALEM SUMP 16 FR W/ARV (TUBING) ×4 IMPLANT
TUBING EXTENTION W/L.L. (IV SETS) ×4 IMPLANT
TUBING STRAIGHTSHOT EPS 5PK (TUBING) ×4 IMPLANT

## 2016-05-23 NOTE — Anesthesia Postprocedure Evaluation (Signed)
Anesthesia Post Note  Patient: ARICA PROEFROCK  Procedure(s) Performed: Procedure(s) (LRB): NASAL SEPTOPLASTY WITH TURBINATE REDUCTION (Bilateral) LIMITED RIGHT ENDOSCOPIC SINUS SURGERY (Right)  Patient location during evaluation: PACU Anesthesia Type: General Level of consciousness: awake and alert Pain management: pain level controlled Vital Signs Assessment: post-procedure vital signs reviewed and stable Respiratory status: spontaneous breathing, nonlabored ventilation and respiratory function stable Cardiovascular status: blood pressure returned to baseline and stable Postop Assessment: no signs of nausea or vomiting Anesthetic complications: no    Last Vitals:  Vitals:   05/23/16 1015 05/23/16 1038  BP:  111/75  Pulse: 80 83  Resp: 14   Temp:      Last Pain:  Vitals:   05/23/16 0930  PainSc: 0-No pain                 Nilda Simmer

## 2016-05-23 NOTE — H&P (Signed)
[name] is an 60 y.o. female.   Chief Complaint: nasal obstruction and sinusitis HPI: Deviated septum and nasal obstruction,  Sinusitis and cough  Past Medical History:  Diagnosis Date  . Allergic rhinitis   . Arthritis    "hands and legs and feet" (04/27/2012)  . Cancer ([place]) 05   rt arm mole rem cancer  . Chronic bronchitis ([place])    "keep it all the time" (04/27/2012)  . COPD (chronic obstructive pulmonary disease) ([place])   . Depression   . Diverticulosis   . DJD (degenerative joint disease)   . Emphysema   . Epilepsy ([place])   . Esophageal stricture   . Exertional dyspnea   . GERD (gastroesophageal reflux disease)   . Glaucoma   . H/O blood clots    Right leg  . Hyperlipidemia   . Hypertension   . IBS (irritable bowel syndrome)   . Migraine   . Neck pain    Cervical disc  . OSA on CPAP    6-7 yrs; pt wears CPAP but has not been able to use it lately due to sinus issues  . Pneumonia 2012; 04/27/2012  . Right leg DVT ([place]) 1982   groin  . Seizures ([place])   . Type II diabetes mellitus ([place])    No meds since weight loss  . UTI (urinary tract infection)    2 weeks ago  . Vertigo 10/24/2014    Past Surgical History:  Procedure Laterality Date  . ABDOMINAL HYSTERECTOMY  2001   "partial"  . CARPAL TUNNEL RELEASE  ~ 2008   right  . CATARACT EXTRACTION Bilateral   . COLONOSCOPY WITH ESOPHAGOGASTRODUODENOSCOPY (EGD)    . KNEE ARTHROSCOPY  1990's   left  . SHOULDER OPEN ROTATOR CUFF REPAIR  ~ 2010   left  . TOTAL KNEE ARTHROPLASTY  08/23/2012   right  . TOTAL KNEE ARTHROPLASTY  08/23/2012   Procedure: TOTAL KNEE ARTHROPLASTY;  Surgeon: Lorn Junes, MD;  Location: [facility];  Service: Orthopedics;  Laterality: Right;  RIGHT KNEE ARTHROPLASTY MEDIAL AND LATERAL COMPARTMENTS WITH PATELLA RESURFACING  . TUBAL LIGATION  2001    Family History  Problem Relation Age of Onset  . Alcohol abuse Father   . Lung cancer Father     was a smoker  . Emphysema Father     was a  smoker  . Heart disease Sister   . Lung cancer Mother     was a smoker  . Stroke Mother   . Other Sister     blood clotting disorder  . Seizures Brother   . Heart disease Maternal Grandfather   . Diabetes Maternal Grandfather   . Heart disease      uncle  . Diabetes      uncles/aunts  . Breast cancer Maternal Aunt   . Bipolar disorder Son   . Muscular dystrophy Son   . Heart disease Son   . Colon cancer Neg Hx    Social History:  reports that she has been smoking Cigarettes.  She started smoking about 40 years ago. She has a 40.00 pack-year smoking history. She quit smokeless tobacco use about 3 years ago. She reports that she does not drink alcohol or use drugs.  Allergies:  Allergies  Allergen Reactions  . Amoxicillin Anaphylaxis    Breakout, mouth swells  . Azithromycin Swelling and Rash    "swelling all over, high, thrush"  . Chantix [Varenicline] Other (See Comments)    "bad  dreams, difficulty breathing"  . Doxycycline Hyclate Anaphylaxis and Rash    swelling  . Levofloxacin Anaphylaxis  . Penicillins Anaphylaxis, Shortness Of Breath and Rash    "ALMOST DIED, ended up in hospital"  PCN reaction causing immediate rash, facial/tongue/throat swelling, SOB or lightheadedness with hypotension: YES PCN reaction causing severe rash involving mucus membranes or skin necrosis: NO PCN reaction that required hospitalization YES Has patient had a PCN reaction occurring within the last 10 years: NO   . Sulfonamide Derivatives Shortness Of Breath, Swelling and Rash    "break out from head to toe"  . Clarithromycin Hives    swelling  . Nortriptyline Swelling, Rash and Other (See Comments)    Per patient had "kidney problems" on this medication, could not use bathroom (urinary retention)  . Codeine Nausea And Vomiting  . Fluticasone-Salmeterol Other (See Comments)    REACTION: ulcers in mouth  . Triptans Rash    Mouth breaks out and start itching    Medications Prior to  Admission  Medication Sig Dispense Refill  . acetaminophen (TYLENOL) 500 MG tablet Take 1,000 mg by mouth every other day.    . albuterol (PROVENTIL) (2.5 MG/3ML) 0.083% nebulizer solution USE 1 VIAL IN NEBULIZER EVERY 6 HOURS AS NEEDED FOR SHORTNESS OF BREATH 75 mL 0  . aspirin EC 81 MG tablet Take 1 tablet (81 mg total) by mouth daily. (Patient taking differently: Take 81 mg by mouth every morning. )    . CALCITRATE 950 MG tablet TAKE 1 TABLET BY MOUTH TWO TIMES DAILY 60 tablet 0  . Cholecalciferol (VITAMIN D3) 400 units tablet TAKE 2 TABLETS BY MOUTH DAILY 60 tablet 0  . DULERA 200-5 MCG/ACT AERO INHALE 2 PUFFS INTO THE LUNGS TWO TIMES DAILY 13 g 0  . ferrous sulfate 325 (65 FE) MG tablet Take 1 tablet (325 mg total) by mouth 2 (two) times daily with a meal. (Patient taking differently: Take 325 mg by mouth daily with breakfast. ) 60 tablet 3  . fluticasone (FLONASE) 50 MCG/ACT nasal spray USE 2 SPRAYS INTO EACH NOSTRIL DAILY AS NEEDED FOR CONGESTION. 16 g 0  . lamoTRIgine (LAMICTAL) 200 MG tablet Take 1 tablet (200 mg total) by mouth 2 (two) times daily. 60 tablet 6  . loratadine (CLARITIN) 10 MG tablet TAKE 1 TABLET BY MOUTH EVERY DAY 30 tablet 0  . meloxicam (MOBIC) 7.5 MG tablet Take 1 tablet (7.5 mg total) by mouth daily. 30 tablet 0  . NON FORMULARY Use CPAP at bedtime    . pantoprazole (PROTONIX) 40 MG tablet TAKE 1 TABLET BY MOUTH TWO TIMES DAILY BEFORE A MEAL 60 tablet 0  . PROAIR HFA 108 (90 Base) MCG/ACT inhaler INHALE 2 PUFFS INTO THE LUNGS EVERY 6 HOURS AS NEEDED FOR SHORTNESS OF BREATH 8.5 g 0  . sertraline (ZOLOFT) 50 MG tablet TAKE 1 TABLET BY MOUTH DAILY. 30 tablet 0  . simvastatin (ZOCOR) 40 MG tablet TAKE 1 TABLET BY MOUTH AT BEDTIME 30 tablet 0  . topiramate (TOPAMAX) 100 MG tablet TAKE ONE TABLET BY MOUTH EVERY MORNING AND TAKE TWO TABLETS BY MOUTH AT BEDTIME 270 tablet 0    Results for orders placed or performed during the hospital encounter of 05/23/16 (from the past 48  hour(s))  Glucose, capillary     Status: Abnormal   Collection Time: 05/23/16  7:17 AM  Result Value Ref Range   Glucose-Capillary 114 (H) 65 - 99 mg/dL   No results found.  Review of Systems  Constitutional: Negative.   HENT: Negative.   Respiratory: Positive for cough.   Cardiovascular: Negative.   Gastrointestinal: Negative.     Blood pressure 112/62, pulse (!) 51, temperature 97.8 F (36.6 C), resp. rate 18, weight 79.4 kg (175 lb), SpO2 95 %. Physical Exam  Constitutional: She appears well-developed.  HENT:  Deviated septum and nasal obstruction  Neck: Normal range of motion. Neck supple.  Cardiovascular: Normal rate.   Respiratory: Effort normal.  GI: Soft.     Assessment/Plan Adm for OP sino-nasal surgery  Mikiala Fugett, MD 05/23/2016, 7:24 AM

## 2016-05-23 NOTE — Anesthesia Procedure Notes (Signed)
Procedure Name: Intubation Date/Time: 05/23/2016 8:00 AM Performed by: Mariea Clonts Pre-anesthesia Checklist: Patient identified, Emergency Drugs available, Suction available and Patient being monitored Patient Re-evaluated:Patient Re-evaluated prior to inductionOxygen Delivery Method: Circle System Utilized Preoxygenation: Pre-oxygenation with 100% oxygen Intubation Type: IV induction Ventilation: Mask ventilation without difficulty Laryngoscope Size: Miller and 2 Grade View: Grade I Tube type: Oral Tube size: 7.0 mm Number of attempts: 1 Airway Equipment and Method: Stylet and Oral airway Placement Confirmation: ETT inserted through vocal cords under direct vision,  positive ETCO2 and breath sounds checked- equal and bilateral Tube secured with: Tape Dental Injury: Teeth and Oropharynx as per pre-operative assessment

## 2016-05-23 NOTE — Brief Op Note (Signed)
05/23/2016  9:10 AM  PATIENT:  [name]  60 y.o. female  PRE-OPERATIVE DIAGNOSIS:  DEVIATED SEPTUM, NASAL TURBINATE HYPERTROPHY, CHRONIC SINUSITIS  POST-OPERATIVE DIAGNOSIS:  DEVIATED SEPTUM, NASAL TURBINATE HYPERTROPHY, CHRONIC SINUSITIS  PROCEDURE:  Procedure(s): NASAL SEPTOPLASTY WITH TURBINATE REDUCTION (Bilateral) LIMITED RIGHT ENDOSCOPIC SINUS SURGERY (Right)  SURGEON:  Surgeon(s) and Role:    * Jerrell Belfast, MD - Primary  PHYSICIAN ASSISTANT:   ASSISTANTS: none   ANESTHESIA:   general  EBL:  Total I/O In: 1000 [I.V.:1000] Out: - <100cc  BLOOD ADMINISTERED:none  DRAINS: none   LOCAL MEDICATIONS USED:  LIDOCAINE  and Amount: 7 ml  SPECIMEN:  Source of Specimen:  Rt sinus contents  DISPOSITION OF SPECIMEN:  PATHOLOGY  COUNTS:  YES  TOURNIQUET:  * No tourniquets in log *  DICTATION: .Other Dictation: Dictation Number [id number]  PLAN OF CARE: Discharge to home after PACU  PATIENT DISPOSITION:  PACU - hemodynamically stable.   Delay start of Pharmacological VTE agent (>24hrs) due to surgical blood loss or risk of bleeding: not applicable

## 2016-05-23 NOTE — Transfer of Care (Signed)
Immediate Anesthesia Transfer of Care Note  Patient: [name]  Procedure(s) Performed: Procedure(s): NASAL SEPTOPLASTY WITH TURBINATE REDUCTION (Bilateral) LIMITED RIGHT ENDOSCOPIC SINUS SURGERY (Right)  Patient Location: PACU  Anesthesia Type:General  Level of Consciousness: awake, alert  and oriented  Airway & Oxygen Therapy: Patient Spontanous Breathing and Patient connected to nasal cannula oxygen  Post-op Assessment: Report given to RN, Post -op Vital signs reviewed and stable and Patient moving all extremities X 4  Post vital signs: Reviewed and stable  Last Vitals:  Vitals:   05/23/16 0627  BP: 112/62  Pulse: (!) 51  Resp: 18  Temp: 36.6 C    Last Pain: There were no vitals filed for this visit.       Complications: No apparent anesthesia complications

## 2016-05-24 NOTE — Op Note (Deleted)
  The note originally documented on this encounter has been moved the the encounter in which it belongs.  

## 2016-05-24 NOTE — Op Note (Signed)
NAMEADDIS, BOBIK                   ACCOUNT NO.:  [id number]  MEDICAL RECORD NO.:  [id number]  LOCATION:                               FACILITY:  [facility]  PHYSICIAN:  Early Chars. Wilburn Cornelia, M.D.DATE OF BIRTH:  April 17, 1956  DATE OF PROCEDURE:  05/23/2016 DATE OF DISCHARGE:                              OPERATIVE REPORT   LOCATION:  [facility] OR.  PREOPERATIVE DIAGNOSES: 1. Chronic right sinusitis. 2. Deviated nasal septum. 3. Inferior turbinate hypertrophy.  POSTOPERATIVE DIAGNOSES: 1. Chronic right sinusitis. 2. Deviated nasal septum. 3. Inferior turbinate hypertrophy.  INDICATION FOR SURGERY: 1. Chronic right sinusitis. 2. Deviated nasal septum. 3. Inferior turbinate hypertrophy.  SURGICAL PROCEDURE: 1. Right endoscopic sinus surgery consisting of anterior     ethmoidectomy, maxillary antrostomy, and nasofrontal recess. 2. Nasal septoplasty. 3. Bilateral inferior turbinate reduction.  ANESTHESIA:  General endotracheal.  COMPLICATIONS:  None.  ESTIMATED BLOOD LOSS:  Less than 100 mL.  The patient was transferred from the operating room to the recovery room in stable condition.  BRIEF HISTORY:  The patient is a 60 year old white female who is referred for evaluation of chronic nasal airway obstruction and chronic sinusitis with cough.  She has severe pulmonary dysfunction and is a chronic cigarette smoker.  She has a history of recurrent infections and cough with worsening pulmonary function when her sinuses are inflamed. Unfortunately, the patient has extreme antibiotic sensitivities with very few antibiotics available for clinical treatment of infections. Given her history and findings including CT scanning, I recommended nasal septoplasty, turbinate reduction, and right-sided endoscopic sinus surgery.  The risks and benefits of the procedures were discussed in detail with the patient and her family.  They understood and agreed with our plan for surgery  which is scheduled on elective basis at [facility].  SURGICAL PROCEDURE:  The patient was brought to the operating room on May 23, 2016, placed in a supine position on the operating table. General endotracheal anesthesia was established without difficulty. When the patient was adequately anesthetized, she was positioned.  A surgical time-out was then performed with correct identification of the patient, the surgical procedure, and the right laterality of the sinus component of the operation.  The patient's nose was then injected with a total of 7 mL of 1% lidocaine with 1:100,000 dilution epinephrine injected in submucosal fashion along the right lateral nasal passageway, uncinate process, middle turbinate, nasal septum, and inferior turbinates bilaterally.  The patient's nose is packed with Afrin-soaked cottonoid pledgets that was left in place for approximately 10 minutes, left vasoconstriction, and hemostasis.  The patient was prepped, draped, and prepared.  The surgical procedure was begun.  With the patient prepared for surgery, a nasal septoplasty was performed.  A right anterior hemitransfixion incision was created and mucoperichondrial flap was elevated on the right.  The anterior cartilaginous septum was crossed.  The mucoperichondrial flap was elevated on the left.  Deviated bone and cartilage were then resected. The patient had a large bony septal spur which was mobilized and removed, preserving the overlying mucosa.  The anterior septal cartilage was morselized.  This was returned to the mucoperichondrial pocket, and  the flaps were reapproximated with a 4-0 gut suture on a Keith needle in a horizontal mattress fashion.  At the conclusion of the procedure, bilateral Doyle nasal septal splints were placed after the application of Bactroban ointment and sutured in position with a 3-0 Ethilon suture.  Attention was then turned to the patient's right side,  and using a 0 degree endoscope, endoscopic sinus surgery was begun.  The middle turbinate was carefully medialized.  The uncinate process reflected anteriorly and resected in its entirety, and the anterior ethmoidectomy was performed with identification of the ethmoid bulla.  Dissection from anterior to posterior and then from the mid aspect of the ethmoid air cells superiorly to remove bony septations and thickened diseased mucosa.  A 45-degree telescope was then used with a curved microdebrider for dissection of the superior anterior ethmoid cells and extension into the nasal frontal recess, which was occluded by underlying ethmoid cells and diseased mucosa.  The nasal frontal recess was widely patent at the conclusion of the procedure.  There was no evidence of infection within the frontal sinus.  Attention was then turned to the lateral nasal wall where a large posterior accessory ostium was identified.  The natural ostium was identified after removal of residual uncinate process tissue. The intervening soft tissue was then resected with through-cutting forceps.  Examination of the right maxillary sinus showed no evidence of polyps, pus or active infection.  The sinus ostia were enlarged in an anterior, inferior, and posterior direction, creating widely patent maxillary sinus ostium.  Attention was then turned to the inferior turbinates where turbinate reduction was performed with bipolar intramural cautery set at 12 watts. Two submucosal passes were made in each inferior turbinate.  Anterior incisions were created, overlying soft tissue elevated.  A small amount of turbinate tissue was resected.  The turbinates were then outfractured creating more patent nasal cavity.  The patient's nasal cavity was then thoroughly examined.  There was no active bleeding, and surgical debris was cleared.  No packing was placed.  Bilateral Doyle nasal septal splints were placed and sutured with 3-0  Ethilon suture.  The patient's nasopharynx was irrigated and suctioned.  Orogastric tube was passed.  Stomach contents aspirated. The patient awakened from her anesthetic and then transferred from the operating room to the recovery room in stable condition.  There were no complications.  Blood loss was less than 100 mL.  FINDINGS:  Severe nasal septal deviation with near total obstruction of the right nasal passageway.  Bilateral inferior turbinate hypertrophy. Moderate polypoid inflammatory mucosa in the anterior ethmoid and maxillary antrostomy.  No nasal packing was placed at the conclusion of the procedure and bilateral Doyle septal splints were placed.    ______________________________ Early Chars. Wilburn Cornelia, M.D.   ______________________________ Early Chars. Wilburn Cornelia, M.D.    DLS/MEDQ  D:  [id number]  T:  05/24/2016  Job:  [id number]

## 2016-05-25 ENCOUNTER — Encounter (HOSPITAL_COMMUNITY): Payer: Self-pay | Admitting: Otolaryngology

## 2016-05-26 ENCOUNTER — Ambulatory Visit: Payer: Medicaid Other | Admitting: Physical Medicine & Rehabilitation

## 2016-05-29 ENCOUNTER — Ambulatory Visit: Payer: Medicaid Other | Admitting: Internal Medicine

## 2016-06-03 ENCOUNTER — Other Ambulatory Visit: Payer: Self-pay | Admitting: Nurse Practitioner

## 2016-06-03 MED ORDER — QUETIAPINE FUMARATE 50 MG PO TABS: 50.0000 mg | ORAL_TABLET | Freq: Every day | ORAL | 0 refills | Status: DC

## 2016-06-03 NOTE — Addendum Note (Signed)
Addended byOliver Hum on: 06/03/2016 02:37 PM   Modules accepted: Orders

## 2016-06-03 NOTE — Telephone Encounter (Signed)
[facility] [phone] called to request refill of SEROQUEL.

## 2016-06-11 ENCOUNTER — Encounter: Payer: Self-pay | Admitting: Nurse Practitioner

## 2016-06-11 ENCOUNTER — Ambulatory Visit (INDEPENDENT_AMBULATORY_CARE_PROVIDER_SITE_OTHER): Payer: Medicaid Other | Admitting: Nurse Practitioner

## 2016-06-11 VITALS — BP 106/70 | HR 80 | Ht 61.0 in | Wt 174.0 lb

## 2016-06-11 DIAGNOSIS — G43019 Migraine without aura, intractable, without status migrainosus: Secondary | ICD-10-CM

## 2016-06-11 DIAGNOSIS — G40909 Epilepsy, unspecified, not intractable, without status epilepticus: Secondary | ICD-10-CM

## 2016-06-11 MED ORDER — TOPIRAMATE 100 MG PO TABS: ORAL_TABLET | ORAL | 3 refills | Status: DC

## 2016-06-11 MED ORDER — LAMOTRIGINE 200 MG PO TABS: 200.0000 mg | ORAL_TABLET | Freq: Two times a day (BID) | ORAL | 11 refills | Status: DC

## 2016-06-11 MED ORDER — QUETIAPINE FUMARATE 50 MG PO TABS: 50.0000 mg | ORAL_TABLET | Freq: Every day | ORAL | 6 refills | Status: DC

## 2016-06-11 NOTE — Progress Notes (Signed)
[facility]  PATIENT: [name] DOB: 03-Sep-1955   REASON FOR VISIT: Follow-up for seizure disorder and intractable migraines HISTORY FROM: Patient    HISTORY OF PRESENT ILLNESS:UPDATE 11/01/2017CM: [name], 60 year old female returns for follow-up. She has a history of intractable migraines and a history of seizure disorder. Last seizure occurred in September 2016. She is on Lamictal 200 mg twice a day and Topamax 100 mg 1 in the morning and 2 at night. She had sinus surgery two weeks ago and she's had more headaches. She's been taking oxycodone. She was referred for Botox with Dr. Ella Bodo and has been getting Botox every 3 months. She is due now. She also complains that she is having trouble urinating and is due to see her primary on Friday. Orthostatic hypotension noted today on exam standing pressure was 90/60. She denies any falls but she does complains of dizziness she returns for reevaluation. She remains on Seroquel.   4/3/17CMMs. [name] is a 60 year old right-handed white female with a history of intractable epilepsy, and intractable headache. The patient is on lamotrigine and Topamax without full control of the seizures, the patient was sent for video EEG monitoring study at [facility], and the patient did not have any seizure episodes during the monitoring period. The patient has not had any seizures since the video EEG, this was done in September 2016.  The patient is not operating a motor vehicle. She still has frequent headaches, she may have 7 headache free days a month only. The headaches when they do come on last all day long. The patient has been tried on a multitude of medications including lamotrigine, Topamax, nortriptyline, Seroquel, hydrocodone, Advil, and Tylenol. The patient cannot take triptan medications. She may have some nausea, usually not vomiting with the headache. The headaches are impacting her ability to function during the day. She has an appointment  to be evaluated for Botox tomorrow and she was encouraged to keep that appointment. She also had recent CT of the sinuses with  significant sinus disease and was referred to ENT. The patient returns to the office today for an evaluation.   REVIEW OF SYSTEMS: Full 14 system review of systems performed and notable only for those listed, all others are neg:  Constitutional: neg  Cardiovascular: neg Ear/Nose/Throat: neg  Skin: neg Eyes: neg Respiratory: Cough  Gastroitestinal: Constipation  Genitourinary difficulty urinating Hematology/Lymphatic: neg  Endocrine: neg Musculoskeletal:neg Allergy/Immunology: neg Neurological: Headache, seizures, tremors, dizziness Psychiatric: Depression and anxiety Sleep : neg   ALLERGIES: Allergies  Allergen Reactions  . Amoxicillin Anaphylaxis    Breakout, mouth swells  . Azithromycin Swelling and Rash    "swelling all over, high, thrush"  . Chantix [Varenicline] Other (See Comments)    "bad dreams, difficulty breathing"  . Doxycycline Hyclate Anaphylaxis and Rash    swelling  . Levofloxacin Anaphylaxis  . Penicillins Anaphylaxis, Shortness Of Breath and Rash    "ALMOST DIED, ended up in hospital"  PCN reaction causing immediate rash, facial/tongue/throat swelling, SOB or lightheadedness with hypotension: YES PCN reaction causing severe rash involving mucus membranes or skin necrosis: NO PCN reaction that required hospitalization YES Has patient had a PCN reaction occurring within the last 10 years: NO   . Sulfonamide Derivatives Shortness Of Breath, Swelling and Rash    "break out from head to toe"  . Clarithromycin Hives    swelling  . Nortriptyline Swelling, Rash and Other (See Comments)    Per patient had "kidney problems" on this medication,  could not use bathroom (urinary retention)  . Codeine Nausea And Vomiting  . Fluticasone-Salmeterol Other (See Comments)    REACTION: ulcers in mouth  . Triptans Rash    Mouth breaks out and  start itching    HOME MEDICATIONS: Outpatient Medications Prior to Visit  Medication Sig Dispense Refill  . acetaminophen (TYLENOL) 500 MG tablet Take 1,000 mg by mouth every other day.    . albuterol (PROVENTIL) (2.5 MG/3ML) 0.083% nebulizer solution USE 1 VIAL IN NEBULIZER EVERY 6 HOURS AS NEEDED FOR SHORTNESS OF BREATH 75 mL 0  . CALCITRATE 950 MG tablet TAKE 1 TABLET BY MOUTH TWO TIMES DAILY 60 tablet 0  . Cholecalciferol (VITAMIN D3) 400 units tablet TAKE 2 TABLETS BY MOUTH DAILY 60 tablet 0  . DULERA 200-5 MCG/ACT AERO INHALE 2 PUFFS INTO THE LUNGS TWO TIMES DAILY 13 g 0  . ferrous sulfate 325 (65 FE) MG tablet Take 1 tablet (325 mg total) by mouth 2 (two) times daily with a meal. (Patient taking differently: Take 325 mg by mouth daily with breakfast. ) 60 tablet 3  . lamoTRIgine (LAMICTAL) 200 MG tablet Take 1 tablet (200 mg total) by mouth 2 (two) times daily. 60 tablet 6  . loratadine (CLARITIN) 10 MG tablet TAKE 1 TABLET BY MOUTH EVERY DAY 30 tablet 0  . meloxicam (MOBIC) 7.5 MG tablet Take 1 tablet (7.5 mg total) by mouth daily. 30 tablet 0  . NON FORMULARY Use CPAP at bedtime    . pantoprazole (PROTONIX) 40 MG tablet TAKE 1 TABLET BY MOUTH TWO TIMES DAILY BEFORE A MEAL 60 tablet 0  . PROAIR HFA 108 (90 Base) MCG/ACT inhaler INHALE 2 PUFFS INTO THE LUNGS EVERY 6 HOURS AS NEEDED FOR SHORTNESS OF BREATH 8.5 g 0  . QUEtiapine (SEROQUEL) 50 MG tablet Take 1 tablet (50 mg total) by mouth at bedtime. 30 tablet 0  . sertraline (ZOLOFT) 50 MG tablet TAKE 1 TABLET BY MOUTH DAILY. 30 tablet 0  . simvastatin (ZOCOR) 40 MG tablet TAKE 1 TABLET BY MOUTH AT BEDTIME 30 tablet 0  . topiramate (TOPAMAX) 100 MG tablet TAKE ONE TABLET BY MOUTH EVERY MORNING AND TAKE TWO TABLETS BY MOUTH AT BEDTIME 270 tablet 0   No facility-administered medications prior to visit.     PAST MEDICAL HISTORY: Past Medical History:  Diagnosis Date  . Allergic rhinitis   . Arthritis    "hands and legs and feet"  (04/27/2012)  . Cancer ([place]) 05   rt arm mole rem cancer  . Chronic bronchitis ([place])    "keep it all the time" (04/27/2012)  . COPD (chronic obstructive pulmonary disease) ([place])   . Depression   . Diverticulosis   . DJD (degenerative joint disease)   . Emphysema   . Epilepsy ([place])   . Esophageal stricture   . Exertional dyspnea   . GERD (gastroesophageal reflux disease)   . Glaucoma   . H/O blood clots    Right leg  . Hyperlipidemia   . Hypertension   . IBS (irritable bowel syndrome)   . Migraine   . Neck pain    Cervical disc  . OSA on CPAP    6-7 yrs; pt wears CPAP but has not been able to use it lately due to sinus issues  . Pneumonia 2012; 04/27/2012  . Right leg DVT ([place]) 1982   groin  . Seizures ([place])   . Type II diabetes mellitus ([place])    No meds since weight loss  .  UTI (urinary tract infection)    2 weeks ago  . Vertigo 10/24/2014    PAST SURGICAL HISTORY: Past Surgical History:  Procedure Laterality Date  . ABDOMINAL HYSTERECTOMY  2001   "partial"  . CARPAL TUNNEL RELEASE  ~ 2008   right  . CATARACT EXTRACTION Bilateral   . COLONOSCOPY WITH ESOPHAGOGASTRODUODENOSCOPY (EGD)    . KNEE ARTHROSCOPY  1990's   left  . NASAL SEPTOPLASTY W/ TURBINOPLASTY Bilateral 05/23/2016   Procedure: NASAL SEPTOPLASTY WITH TURBINATE REDUCTION;  Surgeon: Jerrell Belfast, MD;  Location: [facility];  Service: ENT;  Laterality: Bilateral;  . SHOULDER OPEN ROTATOR CUFF REPAIR  ~ 2010   left  . SINUS ENDO WITH FUSION Right 05/23/2016   Procedure: LIMITED RIGHT ENDOSCOPIC SINUS SURGERY;  Surgeon: Jerrell Belfast, MD;  Location: [facility];  Service: ENT;  Laterality: Right;  . TOTAL KNEE ARTHROPLASTY  08/23/2012   right  . TOTAL KNEE ARTHROPLASTY  08/23/2012   Procedure: TOTAL KNEE ARTHROPLASTY;  Surgeon: Lorn Junes, MD;  Location: [place];  Service: Orthopedics;  Laterality: Right;  RIGHT KNEE ARTHROPLASTY MEDIAL AND LATERAL COMPARTMENTS WITH PATELLA RESURFACING  . TUBAL LIGATION  2001     FAMILY HISTORY: Family History  Problem Relation Age of Onset  . Alcohol abuse Father   . Lung cancer Father     was a smoker  . Emphysema Father     was a smoker  . Heart disease Sister   . Lung cancer Mother     was a smoker  . Stroke Mother   . Other Sister     blood clotting disorder  . Seizures Brother   . Heart disease Maternal Grandfather   . Diabetes Maternal Grandfather   . Heart disease      uncle  . Diabetes      uncles/aunts  . Breast cancer Maternal Aunt   . Bipolar disorder Son   . Muscular dystrophy Son   . Heart disease Son   . Colon cancer Neg Hx     SOCIAL HISTORY: Social History   Social History  . Marital status: Married    Spouse name: [name]  . Number of children: 2  . Years of education: 12   Occupational History  . disabled    Social History Main Topics  . Smoking status: Current Every Day Smoker    Packs/day: 1.00    Years: 40.00    Types: Cigarettes    Start date: 08/12/1975  . Smokeless tobacco: Former Systems developer    Quit date: 08/23/2012     Comment: started back smoking in 11/2012  . Alcohol use No  . Drug use: No  . Sexual activity: No   Other Topics Concern  . Not on file   Social History Narrative   Patient lives at home with husband [name], her son and his wife.    Patient has 2 children.    Patient has a high school education.    Patient is currently unemployed.    Patient is right handed.    Patient drinks 2 cups of caffeine per day.        PHYSICAL EXAM  Vitals:   06/11/16 1528  BP: 108/70 lying 106/70 seated 90/60 standing  Pulse: 80  Weight: 174 lb (78.9 kg)  Height: 5\' 1"  ([id number] m)   Body mass index is 32.88 kg/m.  Generalized: Well developed, obese female in no acute distress  Head: normocephalic and atraumatic,. Oropharynx benign  Neck: Supple, no carotid bruits  Cardiac: Regular rate rhythm, no murmur  Musculoskeletal: No deformity   Neurological examination   Mentation: Alert oriented to time,  place, history taking. Attention span and concentration appropriate. Recent and remote memory intact.  Follows all commands speech and language fluent.   Cranial nerve II-XII: Pupils were equal round reactive to light extraocular movements were full, visual field were full on confrontational test. Facial sensation and strength were normal. hearing was intact to finger rubbing bilaterally. Uvula tongue midline. head turning and shoulder shrug were normal and symmetric.Tongue protrusion into cheek strength was normal. Motor: normal bulk and tone, full strength in the BUE, BLE, fine finger movements normal, no pronator drift. No focal weakness Sensory: normal and symmetric to light touch, pinprick, and  Vibration in the upper and lower extremities,  Coordination: finger-nose-finger, heel-to-shin bilaterally, no dysmetria Reflexes: Symmetric upper and lower, plantar responses were flexor bilaterally. Gait and Station: Rising up from seated position without assistance, wide base unsteady gait , tandem gait not attempted Romberg is negative . No assistive device  DIAGNOSTIC DATA (LABS, IMAGING, TESTING) - I reviewed patient records, labs, notes, testing and imaging myself where available.  Lab Results  Component Value Date   WBC 10.7 (H) 05/15/2016   HGB 12.0 05/15/2016   HCT 37.1 05/15/2016   MCV 96.1 05/15/2016   PLT 204 05/15/2016      Component Value Date/Time   NA 141 05/15/2016 1241   NA 144 02/08/2015 1453   K 3.7 05/15/2016 1241   CL 110 05/15/2016 1241   CO2 24 05/15/2016 1241   GLUCOSE 90 05/15/2016 1241   BUN 12 05/15/2016 1241   BUN 19 02/08/2015 1453   CREATININE 1.00 05/15/2016 1241   CREATININE 0.96 03/20/2016 1622   CALCIUM 9.2 05/15/2016 1241   PROT 7.0 03/20/2016 1622   PROT 6.7 02/08/2015 1453   ALBUMIN 4.2 03/20/2016 1622   ALBUMIN 3.9 02/08/2015 1453   AST 12 03/20/2016 1622   ALT 6 03/20/2016 1622   ALKPHOS 105 03/20/2016 1622   BILITOT 0.3 03/20/2016 1622    BILITOT <0.2 02/08/2015 1453   GFRNONAA >60 05/15/2016 1241   GFRNONAA 65 03/20/2016 1622   GFRAA >60 05/15/2016 1241   GFRAA 75 03/20/2016 1622       ASSESSMENT AND PLAN  60 y.o. year old female  has a past medical history of Intractable epilepsy and intractable migraine headaches. She has not had further seizure activity since monitoring at [facility] in September 2016. She is currently on Topamax and Lamictal. She has been receiving Botox every 3 months. She is 2 weeks past due.she had sinus surgery 05/23/16 for significant sinus disease. Orthostatic hypotension noted on exam today  PLAN: Continue Lamictal 200mg  twice daily will refill Continue Topamax  100mg  in the am and 200mg  in the pm will refill Continue Seroquel at current dose will refill Make  follow-up appointment for Botox Stay well hydrated Given  And reviewed Information on orthostatic hypotension F/U in 6 months Dennie Bible, [facility], [facility], [facility] [address], [place], [place] [address] [phone]

## 2016-06-11 NOTE — Progress Notes (Signed)
I have read the note, and I agree with the clinical assessment and plan.  [name]   

## 2016-06-11 NOTE — Patient Instructions (Signed)
Continue Lamictal 200mg  twice daily will refill Continue Topamax  100mg  in the am and 200mg  in the pm will refill Stay well hydrated F/U in 6 months

## 2016-06-12 ENCOUNTER — Other Ambulatory Visit: Payer: Self-pay | Admitting: Nurse Practitioner

## 2016-06-12 ENCOUNTER — Other Ambulatory Visit: Payer: Self-pay | Admitting: Internal Medicine

## 2016-06-13 ENCOUNTER — Ambulatory Visit (INDEPENDENT_AMBULATORY_CARE_PROVIDER_SITE_OTHER): Payer: Medicaid Other | Admitting: Internal Medicine

## 2016-06-13 ENCOUNTER — Encounter: Payer: Self-pay | Admitting: Internal Medicine

## 2016-06-13 VITALS — BP 107/75 | HR 91 | Temp 97.7°F | Wt 175.0 lb

## 2016-06-13 DIAGNOSIS — R3911 Hesitancy of micturition: Secondary | ICD-10-CM | POA: Diagnosis not present

## 2016-06-13 DIAGNOSIS — G8929 Other chronic pain: Secondary | ICD-10-CM | POA: Diagnosis not present

## 2016-06-13 DIAGNOSIS — G43809 Other migraine, not intractable, without status migrainosus: Secondary | ICD-10-CM | POA: Diagnosis not present

## 2016-06-13 DIAGNOSIS — M545 Low back pain, unspecified: Secondary | ICD-10-CM

## 2016-06-13 DIAGNOSIS — K5903 Drug induced constipation: Secondary | ICD-10-CM | POA: Diagnosis not present

## 2016-06-13 DIAGNOSIS — G43019 Migraine without aura, intractable, without status migrainosus: Secondary | ICD-10-CM

## 2016-06-13 DIAGNOSIS — R34 Anuria and oliguria: Secondary | ICD-10-CM | POA: Diagnosis present

## 2016-06-13 LAB — POCT URINALYSIS DIPSTICK
BILIRUBIN UA: NEGATIVE
Blood, UA: NEGATIVE
Glucose, UA: NEGATIVE
KETONES UA: NEGATIVE
Leukocytes, UA: NEGATIVE
Nitrite, UA: NEGATIVE
PH UA: 6
PROTEIN UA: NEGATIVE
SPEC GRAV UA: 1.02
Urobilinogen, UA: 0.2

## 2016-06-13 MED ORDER — DOCUSATE SODIUM 100 MG PO CAPS: 100.0000 mg | ORAL_CAPSULE | Freq: Two times a day (BID) | ORAL | 0 refills | Status: DC

## 2016-06-13 MED ORDER — POLYETHYLENE GLYCOL 3350 17 GM/SCOOP PO POWD: 17.0000 g | Freq: Every day | ORAL | 1 refills | Status: DC

## 2016-06-13 MED ORDER — KETOROLAC TROMETHAMINE 30 MG/ML IJ SOLN
15.0000 mg | Freq: Once | INTRAMUSCULAR | Status: AC
  Administered 2016-06-13: 15 mg via INTRAMUSCULAR

## 2016-06-13 MED ORDER — KETOROLAC TROMETHAMINE 15 MG/ML IJ SOLN: 15.0000 mg | Freq: Once | INTRAMUSCULAR | Status: DC

## 2016-06-13 NOTE — Patient Instructions (Signed)
Ms. Seering,  I think your urinary problems are due to pain medication and constipation. I recommend taking colace twice daily (stool softener) and miralax daily until symptoms improve. Please drink at least 8 glasses of water, as well.  For back pain, continue ibuprofen. If your kidney function does not look okay, I will advise you to stop this. I will call you with the results.  Please get your back x-rays done at your convenience.  Best, Dr. Ola Spurr

## 2016-06-13 NOTE — Progress Notes (Signed)
[facility] Progress Note  Subjective:  [name] is a 60 y.o. who presents with complaints of decreased urination and back pain.   Decreased Urination: - Says she went a week without urinating except for dribbles and started urinating again 2 days ago a couple times a day versus her normal 4-5 times a day - Feels pressure when she tries to urinate - Is not taking any allergy medications - Was followed by Urology for recurrent UTIs and on prophylactic antibiotics previously, per patient  - Had a nasal septoplasty with turbinate reduction and R sinus surgery 05/23/16. Had been taking oxycodone about twice daily after the surgery and took last pill today - Has been having constipation and has only had small, hard BMs after trying milk of magnesia twice - Was noted to have orthostatic hypotension at recent Neurology appointment (11/1) and was advised to drink more fluids - Last lumbar spine films showed normal alignment, no fractures, and maintenance of intervertebral disc space ROS: No abdominal pain, no n/v/d, no fevers, no dysuria  Intractable Migraine Headaches: - With headache today - Follows with [facility] - Takes lamictal, topamax, and seroquel - Is due to have a botox injection, which she has been getting every 3 months ROS: No vision changes, no weakness  Back pain: - Chronic, midline - No worse than usual - No increased activity or injury to the area - Does not recall trying meloxicam, prescribed over the summer - Usually takes tylenol without much improvement - Says oxycodone (prescribed after sinus surgery) has helped the most - Last imaging was  ROS: No sciatica, no bladder or bowel incontinence  Past Medical History:  Diagnosis Date  . Allergic rhinitis   . Arthritis    "hands and legs and feet" (04/27/2012)  . Cancer ([place]) 05   rt arm mole rem cancer  . Chronic bronchitis ([place])    "keep it all the time" (04/27/2012)  . COPD (chronic  obstructive pulmonary disease) ([place])   . Depression   . Diverticulosis   . DJD (degenerative joint disease)   . Emphysema   . Epilepsy ([place])   . Esophageal stricture   . Exertional dyspnea   . GERD (gastroesophageal reflux disease)   . Glaucoma   . H/O blood clots    Right leg  . Hyperlipidemia   . Hypertension   . IBS (irritable bowel syndrome)   . Migraine   . Neck pain    Cervical disc  . OSA on CPAP    6-7 yrs; pt wears CPAP but has not been able to use it lately due to sinus issues  . Pneumonia 2012; 04/27/2012  . Right leg DVT ([place]) 1982   groin  . Seizures ([place])   . Type II diabetes mellitus ([place])    No meds since weight loss  . UTI (urinary tract infection)    2 weeks ago  . Vertigo 10/24/2014    Social History   Social History  . Marital status: Married    Spouse name: [name]  . Number of children: 2  . Years of education: 12   Occupational History  . disabled    Social History Main Topics  . Smoking status: Current Every Day Smoker    Packs/day: 1.00    Years: 40.00    Types: Cigarettes    Start date: 08/12/1975  . Smokeless tobacco: Former Systems developer    Quit date: 08/23/2012     Comment: started back smoking in  11/2012  . Alcohol use No  . Drug use: No  . Sexual activity: No   Other Topics Concern  . Not on file   Social History Narrative   Patient lives at home with husband [name], her son and his wife.    Patient has 2 children.    Patient has a high school education.    Patient is currently unemployed.    Patient is right handed.    Patient drinks 2 cups of caffeine per day.       Allergies  Allergen Reactions  . Amoxicillin Anaphylaxis    Breakout, mouth swells  . Azithromycin Swelling and Rash    "swelling all over, high, thrush"  . Chantix [Varenicline] Other (See Comments)    "bad dreams, difficulty breathing"  . Doxycycline Hyclate Anaphylaxis and Rash    swelling  . Levofloxacin Anaphylaxis  . Penicillins Anaphylaxis, Shortness Of  Breath and Rash    "ALMOST DIED, ended up in hospital"  PCN reaction causing immediate rash, facial/tongue/throat swelling, SOB or lightheadedness with hypotension: YES PCN reaction causing severe rash involving mucus membranes or skin necrosis: NO PCN reaction that required hospitalization YES Has patient had a PCN reaction occurring within the last 10 years: NO   . Sulfonamide Derivatives Shortness Of Breath, Swelling and Rash    "break out from head to toe"  . Clarithromycin Hives    swelling  . Nortriptyline Swelling, Rash and Other (See Comments)    Per patient had "kidney problems" on this medication, could not use bathroom (urinary retention)  . Codeine Nausea And Vomiting  . Fluticasone-Salmeterol Other (See Comments)    REACTION: ulcers in mouth  . Triptans Rash    Mouth breaks out and start itching    Objective: Blood pressure 107/75, pulse 91, temperature 97.7 F (36.5 C), temperature source Oral, weight 175 lb (79.4 kg). Body mass index is 33.07 kg/m. Constitutional: Obese female, in NAD HENT: Nasal turbinates without swelling Abdominal: Soft. +BS, NT, ND, no rebound or guarding.  Musculoskeletal: Midline spinal tenderness along entire spine, TTP over lumbar paraspinal muscles; no limitation in range of motion Neurological: AOx3, no focal deficits. Psychiatric: Normal mood and affect.  Vitals reviewed  Assessment/Plan: Decreased urination - Suspect 2/2 urinary retention from opioid use and concurrent constipation. UA negative for infection, blood, or protein - Prescribed miralax and colace to improve constipation - Recommended increasing fluid intake - Will obtain SCr to check for AKI - Will not be continuing opioid prescription - If becomes a chronic issue, should evaluate for pelvic organ prolapse  Intractable migraine without aura - Requesting shot for pain. Will give 15 mg IM toradol. Pre-op SCr was normal early last month. - To make appointment with  Neurology for botox injection  Midline low back pain without sciatica - Recommended ibuprofen and tylenol. Will call if SCr drawn today is abnormal - Will obtain plain films to r/o pathology given midline spinal tenderness - Continue to offer PT evaluation (though Medicaid, would have an initial consultation visit covered)  Follow-up once back imaging obtained.   Olene Floss, MD [place], PGY-2

## 2016-06-14 LAB — BASIC METABOLIC PANEL WITH GFR
BUN: 17 mg/dL (ref 7–25)
CO2: 20 mmol/L (ref 20–31)
CREATININE: 0.91 mg/dL (ref 0.50–1.05)
Calcium: 9.1 mg/dL (ref 8.6–10.4)
Chloride: 111 mmol/L — ABNORMAL HIGH (ref 98–110)
GFR, EST AFRICAN AMERICAN: 80 mL/min (ref >=60)
GFR, Est Non African American: 70 mL/min (ref >=60)
GLUCOSE: 82 mg/dL (ref 65–99)
Potassium: 4.3 mmol/L (ref 3.5–5.3)
Sodium: 141 mmol/L (ref 135–146)

## 2016-06-15 DIAGNOSIS — R34 Anuria and oliguria: Secondary | ICD-10-CM | POA: Insufficient documentation

## 2016-06-15 NOTE — Assessment & Plan Note (Signed)
-   Requesting shot for pain. Will give 15 mg IM toradol. Pre-op SCr was normal early last month. - To make appointment with Neurology for botox injection

## 2016-06-15 NOTE — Assessment & Plan Note (Addendum)
-   Suspect 2/2 urinary retention from opioid use and concurrent constipation. UA negative for infection, blood, or protein - Prescribed miralax and colace to improve constipation - Recommended increasing fluid intake - Will obtain SCr to check for AKI - Will not be continuing opioid prescription - If becomes a chronic issue, should evaluate for pelvic organ prolapse

## 2016-06-15 NOTE — Assessment & Plan Note (Signed)
-   Recommended ibuprofen and tylenol. Will call if SCr drawn today is abnormal - Will obtain plain films to r/o pathology given midline spinal tenderness - Continue to offer PT evaluation (though Medicaid, would have an initial consultation visit covered)

## 2016-06-16 ENCOUNTER — Telehealth: Payer: Self-pay | Admitting: *Deleted

## 2016-06-16 DIAGNOSIS — K5903 Drug induced constipation: Secondary | ICD-10-CM

## 2016-06-16 MED ORDER — POLYETHYLENE GLYCOL 3350 17 GM/SCOOP PO POWD: 17.0000 g | Freq: Every day | ORAL | 1 refills | Status: DC

## 2016-06-16 NOTE — Telephone Encounter (Signed)
[facility] called needing clarification on Miralax Rx.  Verbal order given to change to 527 g quantity by Dr. Ola Spurr.  Derl Barrow, RN

## 2016-06-17 ENCOUNTER — Encounter: Payer: Self-pay | Admitting: Physical Medicine & Rehabilitation

## 2016-06-17 ENCOUNTER — Encounter: Payer: Medicaid Other | Attending: Physical Medicine & Rehabilitation

## 2016-06-17 ENCOUNTER — Ambulatory Visit (HOSPITAL_BASED_OUTPATIENT_CLINIC_OR_DEPARTMENT_OTHER): Payer: Medicaid Other | Admitting: Physical Medicine & Rehabilitation

## 2016-06-17 ENCOUNTER — Other Ambulatory Visit: Payer: Self-pay | Admitting: Internal Medicine

## 2016-06-17 VITALS — BP 100/68 | HR 80 | Resp 14

## 2016-06-17 DIAGNOSIS — G43711 Chronic migraine without aura, intractable, with status migrainosus: Secondary | ICD-10-CM | POA: Diagnosis not present

## 2016-06-17 DIAGNOSIS — G43809 Other migraine, not intractable, without status migrainosus: Secondary | ICD-10-CM | POA: Diagnosis present

## 2016-06-17 DIAGNOSIS — K219 Gastro-esophageal reflux disease without esophagitis: Secondary | ICD-10-CM

## 2016-06-17 MED ORDER — OMEPRAZOLE 20 MG PO CPDR: 20.0000 mg | DELAYED_RELEASE_CAPSULE | Freq: Two times a day (BID) | ORAL | 3 refills | Status: DC

## 2016-06-17 NOTE — Telephone Encounter (Signed)
Called patient to let her know about normal creatinine. She reports urination is somewhat improved. She is requesting switch from protonix to prilosec.

## 2016-06-17 NOTE — Patient Instructions (Signed)
You received a botulinum toxin injection for chronic headache prevention. You may have soreness in your injection site, you may use ice for 20-30 minutes every 2 hours to help with soreness.  Maximum effect of the injection will be at around 1 week. Other side effects include possible muscle weakness which will subside as the medication wears off.  Medication may be repeated every 3 months as needed. 

## 2016-06-17 NOTE — Progress Notes (Signed)
Botox injection for chronic migraine prophylaxis.  Indication: History of migraines with greater than 15 headaches per month despite trials of oral medications.  Informed consent was obtained after discussing risks and benefits of the procedure with the patient this included bleeding bruising infection as well as facial drooping, eyelid lag, systemic effects of Botox. Patient elects to proceed and has given written consent.  Patient placed in a seated position Dilution 50 units per cc  Muscles injected with dose  Procerus 5 units Corrugator 5 units bilateral Frontalis 5 units 2 injection sites on the right and 2 injection sites on the left Temporalis 5 units in 4 injection sites on the right and 4 injection sites on the left Occipitalis 5 units into 3 injections sites on the right and 3 injection sites on the left  Cervical paraspinals 5 units into 2 injection sites on the right and 2 injection sites on the left trapezius 5 units into 3 injection sites on the right and 3 injection sites on the left  Patient tolerated procedure well. Post procedure instructions given. Appointment for repeat injection in 3 months 

## 2016-06-19 ENCOUNTER — Other Ambulatory Visit: Payer: Self-pay | Admitting: Internal Medicine

## 2016-06-19 DIAGNOSIS — K219 Gastro-esophageal reflux disease without esophagitis: Secondary | ICD-10-CM

## 2016-06-19 MED ORDER — OMEPRAZOLE 20 MG PO CPDR: 20.0000 mg | DELAYED_RELEASE_CAPSULE | Freq: Two times a day (BID) | ORAL | 3 refills | Status: DC

## 2016-06-19 NOTE — Telephone Encounter (Signed)
Pt needs a refill on omeprazole. Fax [phone]. Please advise. Thanks! ep

## 2016-06-24 ENCOUNTER — Other Ambulatory Visit: Payer: Self-pay | Admitting: *Deleted

## 2016-06-24 DIAGNOSIS — K219 Gastro-esophageal reflux disease without esophagitis: Secondary | ICD-10-CM

## 2016-06-24 NOTE — Telephone Encounter (Signed)
Pt pharmacy calling wanting to know if dr would change omeprazole from 30 pills to 60 pills. Please advise. [name], CMA

## 2016-06-25 MED ORDER — OMEPRAZOLE 20 MG PO CPDR: 20.0000 mg | DELAYED_RELEASE_CAPSULE | Freq: Two times a day (BID) | ORAL | 2 refills | Status: DC

## 2016-07-02 ENCOUNTER — Other Ambulatory Visit: Payer: Self-pay | Admitting: Internal Medicine

## 2016-07-02 DIAGNOSIS — K5903 Drug induced constipation: Secondary | ICD-10-CM

## 2016-07-10 ENCOUNTER — Encounter: Payer: Self-pay | Admitting: Internal Medicine

## 2016-07-10 ENCOUNTER — Ambulatory Visit (INDEPENDENT_AMBULATORY_CARE_PROVIDER_SITE_OTHER): Payer: Medicaid Other | Admitting: Internal Medicine

## 2016-07-10 ENCOUNTER — Ambulatory Visit (HOSPITAL_COMMUNITY)
Admission: RE | Admit: 2016-07-10 | Discharge: 2016-07-10 | Disposition: A | Discharge status: Home / Self Care | Payer: Medicaid Other | Source: Ambulatory Visit | Attending: Family Medicine | Admitting: Family Medicine

## 2016-07-10 VITALS — BP 105/68 | HR 88 | Temp 97.8°F | Ht 61.0 in | Wt 172.0 lb

## 2016-07-10 DIAGNOSIS — R55 Syncope and collapse: Secondary | ICD-10-CM | POA: Insufficient documentation

## 2016-07-10 DIAGNOSIS — I959 Hypotension, unspecified: Secondary | ICD-10-CM | POA: Diagnosis not present

## 2016-07-10 DIAGNOSIS — F17210 Nicotine dependence, cigarettes, uncomplicated: Secondary | ICD-10-CM

## 2016-07-10 DIAGNOSIS — R0981 Nasal congestion: Secondary | ICD-10-CM | POA: Diagnosis not present

## 2016-07-10 DIAGNOSIS — J209 Acute bronchitis, unspecified: Secondary | ICD-10-CM | POA: Diagnosis not present

## 2016-07-10 MED ORDER — CEFDINIR 300 MG PO CAPS: 300.0000 mg | ORAL_CAPSULE | Freq: Two times a day (BID) | ORAL | 0 refills | Status: DC

## 2016-07-10 NOTE — Assessment & Plan Note (Signed)
-   EKG performed and was normal. Orthostatic vital signs normal. Suspect vasovagal with coughing but cannot rule out cardiac cause. - Recommended increasing fluid intake.  - Recommended making an appointment with cardiologist for possible Holter monitor.  - Scheduled echo as has not been performed in a couple years to rule out valvular cause

## 2016-07-10 NOTE — Patient Instructions (Addendum)
Ms. Gregor,  Your EKG and orthostatic vital signs were fine today. I recommend drinking as much fluids as possible to help with blood pressure. I also recommend getting an ECHO (ultrasound of your heart to look at your valves) and calling your heart doctor to talk about wearing a monitor to look for abnormal rhythms.  I have placed a prescription for omnicef to help with bronchitis. You may also want to take mucinex to help with secretions. Take your albuterol every 4 hours as needed and dulera twice daily even if you feel well.  Best, Dr. Ola Spurr

## 2016-07-10 NOTE — Assessment & Plan Note (Signed)
-   Patient not ready to quit at this time. Encouraged speaking with her family about importance of not having people smoke around her to improve her chances of successfully quitting.

## 2016-07-10 NOTE — Assessment & Plan Note (Addendum)
-   Symptoms consistent with acute on chronic bronchitis - Prescribed Omnicef due to patient's multiple antibiotic allergies - Recommended guaifenesin - Gave strict return precautions (fever, increased shortness of breath)

## 2016-07-10 NOTE — Progress Notes (Signed)
[name] Progress Note  Subjective:  [name] is a 60 y.o. female with history of COPD, chronic bronchitis, tobacco abuse, and recent sinus surgery who presents with concern for nasal congestion and recent syncopal episode.  Syncope: Patient reports that she passed out on Thanksgiving. She was placed in the [place] when she felt dizzy and decided to sit down. She says her husband found her in the kitchen in a chair passed out. She thinks she was unconscious for less than 5 minutes. She says this has happened probably about 10 times in the past year. She thinks episodes may be related to coughing. Blood pressure 2 days ago was 82/51. She is concerned that her low blood pressure could be causing these episodes. She has been She denies chest pain but says her shoulders have been hurting her more recently. She was cleared by cardiology for sinus surgery. She has had multiple negative stress tests including one in 2005 and another in 2014. Last echo in 2015 showed normal LV function G1DD and trace MR and TR. ROS: No chest pain, no palpitations, no weakness   Nasal congestion: Patient has been having nasal congestion with cough and increased sputum production since last week. She denies fevers or sick contacts. Yesterday she used her albuterol inhaler 4 times. Says she has been using Dulera twice a day. At the beginning of October her pulmonologist wrote her for [facility] the patient denies receiving this prescription or taking a course of medication. She denies increased shortness of breath but endorses wheezing. She has been using Flonase. She thinks cough is making her headaches worse, as she did not have her usual relief after Botox injection 2 weeks ago.  Tobacco Abuse: Has tried multiple times to quit but finds this difficult when family members smoke around her. Cigarettes provide stress relief. Would like to quit but feel she needs family's cooperation first.  Chief Complaint   Patient presents with  . Nasal Congestion    Past Medical History:  Diagnosis Date  . Allergic rhinitis   . Arthritis    "hands and legs and feet" (04/27/2012)  . Cancer ([place]) 05   rt arm mole rem cancer  . Chronic bronchitis ([place])    "keep it all the time" (04/27/2012)  . COPD (chronic obstructive pulmonary disease) ([place])   . Depression   . Diverticulosis   . DJD (degenerative joint disease)   . Emphysema   . Epilepsy ([place])   . Esophageal stricture   . Exertional dyspnea   . GERD (gastroesophageal reflux disease)   . Glaucoma   . H/O blood clots    Right leg  . Hyperlipidemia   . Hypertension   . IBS (irritable bowel syndrome)   . Migraine   . Neck pain    Cervical disc  . OSA on CPAP    6-7 yrs; pt wears CPAP but has not been able to use it lately due to sinus issues  . Pneumonia 2012; 04/27/2012  . Right leg DVT ([place]) 1982   groin  . Seizures ([place])   . Type II diabetes mellitus ([place])    No meds since weight loss  . UTI (urinary tract infection)    2 weeks ago  . Vertigo 10/24/2014    Allergies  Allergen Reactions  . Amoxicillin Anaphylaxis    Breakout, mouth swells  . Azithromycin Swelling and Rash    "swelling all over, high, thrush"  . Chantix [Varenicline] Other (See Comments)    "  bad dreams, difficulty breathing"  . Doxycycline Hyclate Anaphylaxis and Rash    swelling  . Levofloxacin Anaphylaxis  . Penicillins Anaphylaxis, Shortness Of Breath and Rash    "ALMOST DIED, ended up in hospital"  PCN reaction causing immediate rash, facial/tongue/throat swelling, SOB or lightheadedness with hypotension: YES PCN reaction causing severe rash involving mucus membranes or skin necrosis: NO PCN reaction that required hospitalization YES Has patient had a PCN reaction occurring within the last 10 years: NO   . Sulfonamide Derivatives Shortness Of Breath, Swelling and Rash    "break out from head to toe"  . Clarithromycin Hives    swelling  . Nortriptyline  Swelling, Rash and Other (See Comments)    Per patient had "kidney problems" on this medication, could not use bathroom (urinary retention)  . Codeine Nausea And Vomiting  . Fluticasone-Salmeterol Other (See Comments)    REACTION: ulcers in mouth  . Triptans Rash    Mouth breaks out and start itching    Objective: Blood pressure 105/68, pulse 88, temperature 97.8 F (36.6 C), temperature source Oral, height 5\' 1"  (1.549 m), weight 172 lb (78 kg), SpO2 93 %. Body mass index is 32.5 kg/m.  Orthostatic VS for the past 24 hrs:  BP- Lying Pulse- Lying BP- Sitting Pulse- Sitting BP- Standing at 0 minutes Pulse- Standing at 0 minutes  07/10/16 1458 115/69 80 109/71 84 113/63 93   Constitutional: Obese, well appearing female in NAD HENT: Normal posterior oropharynx. Open nares with minimal nasal congestion. Cardiovascular: RRR, S1, S2, no m/r/g.  Pulmonary/Chest: Expiratory wheezing throughout. No respiratory distress.  Neurological: AOx3, no focal deficits. Psychiatric: Normal mood and affect.  Vitals reviewed  Assessment/Plan: Syncope - EKG performed and was normal. Orthostatic vital signs normal. Suspect vasovagal with coughing but cannot rule out cardiac cause. - Recommended increasing fluid intake.  - Recommended making an appointment with cardiologist for possible Holter monitor.  - Scheduled echo as has not been performed in a couple years to rule out valvular cause  Nasal congestion - Symptoms consistent with acute on chronic bronchitis - Prescribed Omnicef due to patient's multiple antibiotic allergies - Recommended guaifenesin - Gave strict return precautions (fever, increased shortness of breath)  Cigarette smoker - Patient not ready to quit at this time. Encouraged speaking with her family about importance of not having people smoke around her to improve her chances of successfully quitting.  Follow-up after ECHO results.  Olene Floss, MD [place], PGY-2

## 2016-07-11 ENCOUNTER — Other Ambulatory Visit: Payer: Self-pay | Admitting: Internal Medicine

## 2016-07-14 ENCOUNTER — Telehealth: Payer: Self-pay | Admitting: Internal Medicine

## 2016-07-14 NOTE — Telephone Encounter (Signed)
Pt needs another referral for Dr. Con Memos, orthopedist for left knee. Please advise. Thanks! ep

## 2016-07-15 ENCOUNTER — Ambulatory Visit (HOSPITAL_COMMUNITY): Payer: Medicaid Other | Attending: Cardiology

## 2016-07-15 ENCOUNTER — Other Ambulatory Visit: Payer: Self-pay

## 2016-07-15 DIAGNOSIS — I119 Hypertensive heart disease without heart failure: Secondary | ICD-10-CM | POA: Diagnosis not present

## 2016-07-15 DIAGNOSIS — J449 Chronic obstructive pulmonary disease, unspecified: Secondary | ICD-10-CM | POA: Diagnosis not present

## 2016-07-15 DIAGNOSIS — Z720 Tobacco use: Secondary | ICD-10-CM | POA: Diagnosis not present

## 2016-07-15 DIAGNOSIS — R55 Syncope and collapse: Secondary | ICD-10-CM | POA: Diagnosis not present

## 2016-07-16 ENCOUNTER — Telehealth: Payer: Self-pay | Admitting: Internal Medicine

## 2016-07-16 NOTE — Telephone Encounter (Signed)
Patient was able to make appointment, as already established with doctor.

## 2016-07-16 NOTE — Telephone Encounter (Signed)
Discussed results with patient. Discussed mildly elevated pulmonary hypertension and relation to lung disease and smoking. Valves and EF looked good.

## 2016-07-16 NOTE — Telephone Encounter (Signed)
Pt would like someone to call her regarding u/s results. Please advise. Thanks! ep

## 2016-08-12 ENCOUNTER — Other Ambulatory Visit: Payer: Self-pay | Admitting: Internal Medicine

## 2016-08-12 DIAGNOSIS — K5903 Drug induced constipation: Secondary | ICD-10-CM

## 2016-09-05 ENCOUNTER — Other Ambulatory Visit: Payer: Self-pay | Admitting: Internal Medicine

## 2016-09-05 DIAGNOSIS — K219 Gastro-esophageal reflux disease without esophagitis: Secondary | ICD-10-CM

## 2016-09-05 DIAGNOSIS — K5903 Drug induced constipation: Secondary | ICD-10-CM

## 2016-09-16 ENCOUNTER — Ambulatory Visit: Payer: Medicaid Other | Admitting: Family Medicine

## 2016-09-17 ENCOUNTER — Encounter: Payer: Self-pay | Admitting: Family Medicine

## 2016-09-17 ENCOUNTER — Ambulatory Visit (INDEPENDENT_AMBULATORY_CARE_PROVIDER_SITE_OTHER): Payer: Medicaid Other | Admitting: Family Medicine

## 2016-09-17 VITALS — BP 108/58 | HR 89 | Temp 98.2°F | Wt 171.0 lb

## 2016-09-17 DIAGNOSIS — R51 Headache: Secondary | ICD-10-CM

## 2016-09-17 DIAGNOSIS — J209 Acute bronchitis, unspecified: Secondary | ICD-10-CM

## 2016-09-17 DIAGNOSIS — R519 Headache, unspecified: Secondary | ICD-10-CM

## 2016-09-17 MED ORDER — KETOROLAC TROMETHAMINE 30 MG/ML IJ SOLN
30.0000 mg | Freq: Once | INTRAMUSCULAR | Status: AC
  Administered 2016-09-17: 30 mg via INTRAMUSCULAR

## 2016-09-17 MED ORDER — PREDNISONE 50 MG PO TABS: 50.0000 mg | ORAL_TABLET | Freq: Every day | ORAL | 0 refills | Status: DC

## 2016-09-17 MED ORDER — CEFDINIR 300 MG PO CAPS: 300.0000 mg | ORAL_CAPSULE | Freq: Two times a day (BID) | ORAL | 0 refills | Status: DC

## 2016-09-17 NOTE — Patient Instructions (Addendum)
It was a pleasure seeing you today in our clinic. Today we discussed your coughing. Here is the treatment plan we have discussed and agreed upon together:   - I provided you a shot of Toradol for your headache. - I've written you prescription for Cefdinir. Take 1 tablet twice a day over the next 10 days. Make sure to take this medication until the bottle is completely empty. - I've written you a prescription for prednisone. Take 1 tablet daily over the next 5 days.

## 2016-09-17 NOTE — Progress Notes (Addendum)
COUGH For about a week. Trouble sleeping. Some SOB. HA. Congestion. Productive cough (green). No fever. No N/V/D.  Has been coughing for ~7 days. Cough is: productive Sputum production: yes, green and thick Medications tried: no Taking blood pressure medications: no  Symptoms Runny nose: some Mucous in back of throat: no Throat burning or reflux: no Wheezing or asthma: no Fever: no Chest Pain: no Shortness of breath: some Leg swelling: no Hemoptysis: no Weight loss: no  ROS see HPI Smoking Status noted   CC, SH/smoking status, and VS noted  Objective: BP (!) 108/58 (BP Location: Right Arm, Patient Position: Sitting, Cuff Size: Normal)   Pulse 89   Temp 98.2 F (36.8 C) (Oral)   Wt 171 lb (77.6 kg)   SpO2 94%   BMI 32.31 kg/m  Gen: NAD, alert, cooperative. HEENT: MMM, EOMI, PERRLA, OP erythematous without evidence of exudates, TMs clear bilaterally, no LAD, neck full ROM. CV: Well-perfused. RRR Resp: Non-labored. Poor air movement. Bilateral wheeze/crackles noted. Cough appeared to be wheezy and tight.  Neuro: Sensation intact throughout.   Assessment and plan:  Acute bronchitis Patient is here with signs and symptoms consistent with acute bronchitis vs COPD exacerbation. Breath sounds were wheezy and tight, with poor airflow. Patient was in no acute distress but appeared uncomfortable. Pulse ox 94%. - Cefdinir 300 mg twice a day 10 days  - I had initially wanted to treat with one dose of ceftriaxone and 5 days of azithromycin; unfortunately due to patient's allergy list I felt as though it would be best providing antibiotic coverage with a medication she had tolerated well the past. - Prednisone 50 mg daily 5 days - Toradol 30mg  IM for HA and discomfort - Strict return precautions discussed.   Meds ordered this encounter  Medications  . cefdinir (OMNICEF) 300 MG capsule    Sig: Take 1 capsule (300 mg total) by mouth 2 (two) times daily.    Dispense:  20  capsule    Refill:  0  . predniSONE (DELTASONE) 50 MG tablet    Sig: Take 1 tablet (50 mg total) by mouth daily.    Dispense:  5 tablet    Refill:  0  . ketorolac (TORADOL) 30 MG/ML injection 30 mg     Elberta Leatherwood, MD,MS,  PGY3 09/17/2016 12:20 PM

## 2016-09-17 NOTE — Assessment & Plan Note (Addendum)
Patient is here with signs and symptoms consistent with acute bronchitis vs COPD exacerbation. Breath sounds were wheezy and tight, with poor airflow. Patient was in no acute distress but appeared uncomfortable. Pulse ox 94%. - Cefdinir 300 mg twice a day 10 days  - I had initially wanted to treat with one dose of ceftriaxone and 5 days of azithromycin; unfortunately due to patient's allergy list I felt as though it would be best providing antibiotic coverage with a medication she had tolerated well the past. - Prednisone 50 mg daily 5 days - Toradol 30mg  IM for HA and discomfort - Strict return precautions discussed.

## 2016-09-19 ENCOUNTER — Ambulatory Visit: Payer: Medicaid Other | Admitting: Physical Medicine & Rehabilitation

## 2016-09-19 ENCOUNTER — Encounter: Payer: Medicaid Other | Attending: Physical Medicine & Rehabilitation

## 2016-09-19 DIAGNOSIS — G43809 Other migraine, not intractable, without status migrainosus: Secondary | ICD-10-CM | POA: Insufficient documentation

## 2016-10-07 ENCOUNTER — Other Ambulatory Visit: Payer: Self-pay | Admitting: Internal Medicine

## 2016-10-07 DIAGNOSIS — K5903 Drug induced constipation: Secondary | ICD-10-CM

## 2016-10-07 DIAGNOSIS — K219 Gastro-esophageal reflux disease without esophagitis: Secondary | ICD-10-CM

## 2016-10-10 ENCOUNTER — Ambulatory Visit: Payer: Medicaid Other | Admitting: Physical Medicine & Rehabilitation

## 2016-10-14 ENCOUNTER — Ambulatory Visit: Payer: Medicaid Other | Admitting: Physical Medicine & Rehabilitation

## 2016-10-23 ENCOUNTER — Encounter (HOSPITAL_COMMUNITY): Payer: Self-pay | Admitting: Emergency Medicine

## 2016-10-23 ENCOUNTER — Ambulatory Visit: Payer: Medicaid Other | Admitting: Physical Medicine & Rehabilitation

## 2016-10-23 ENCOUNTER — Ambulatory Visit (HOSPITAL_COMMUNITY)
Admission: EM | Admit: 2016-10-23 | Discharge: 2016-10-23 | Disposition: A | Discharge status: Home / Self Care | Payer: Medicaid Other | Attending: Internal Medicine | Admitting: Internal Medicine

## 2016-10-23 ENCOUNTER — Ambulatory Visit (INDEPENDENT_AMBULATORY_CARE_PROVIDER_SITE_OTHER): Payer: Medicaid Other

## 2016-10-23 ENCOUNTER — Ambulatory Visit: Payer: Medicaid Other

## 2016-10-23 DIAGNOSIS — Z720 Tobacco use: Secondary | ICD-10-CM

## 2016-10-23 DIAGNOSIS — J209 Acute bronchitis, unspecified: Secondary | ICD-10-CM

## 2016-10-23 DIAGNOSIS — R0602 Shortness of breath: Secondary | ICD-10-CM

## 2016-10-23 DIAGNOSIS — J42 Unspecified chronic bronchitis: Secondary | ICD-10-CM

## 2016-10-23 DIAGNOSIS — J440 Chronic obstructive pulmonary disease with acute lower respiratory infection: Secondary | ICD-10-CM | POA: Diagnosis not present

## 2016-10-23 MED ORDER — IPRATROPIUM-ALBUTEROL 0.5-2.5 (3) MG/3ML IN SOLN
3.0000 mL | Freq: Once | RESPIRATORY_TRACT | Status: AC
  Administered 2016-10-23: 3 mL via RESPIRATORY_TRACT

## 2016-10-23 MED ORDER — IPRATROPIUM-ALBUTEROL 0.5-2.5 (3) MG/3ML IN SOLN
RESPIRATORY_TRACT | Status: AC
  Filled 2016-10-23: qty 3

## 2016-10-23 MED ORDER — PREDNISONE 50 MG PO TABS: ORAL_TABLET | ORAL | 0 refills | Status: DC

## 2016-10-23 MED ORDER — SODIUM CHLORIDE 0.9 % IN NEBU
INHALATION_SOLUTION | RESPIRATORY_TRACT | Status: AC
  Filled 2016-10-23: qty 3

## 2016-10-23 NOTE — Discharge Instructions (Signed)
User nebulizer every 4 hours as needed. Start taking the prednisone as directed. If you get worse or have more trouble breathing or develop fevers she will need to go to the emergency department.

## 2016-10-23 NOTE — ED Triage Notes (Signed)
Pt has been suffering from a cough for 10 days.  Pt has a hx of COPD.  She has been using her inhaler and doing nebulizer treatments with very little relief.

## 2016-10-23 NOTE — ED Provider Notes (Signed)
CSN: [id number]     Arrival date & time 10/23/16  1857 History   First MD Initiated Contact with Patient 10/23/16 2015     Chief Complaint  Patient presents with  . Cough   (Consider location/radiation/quality/duration/timing/severity/associated sxs/prior Treatment) 61 year old female who appears much older than stated age is a long time smoker and current daily smoker presented to the urgent care with a cough for one to 2 weeks associated with shortness of breath. Denies fever. She states she has albuterol nebulizers at home that she can use 3 times or maybe 4 times a day. She also has upper aware and Dulera. She last used her nebulizer this morning. She has not used it since because she just did not feel like getting out of bed to use it. Denies upper respiratory symptoms.      Past Medical History:  Diagnosis Date  . Allergic rhinitis   . Arthritis    "hands and legs and feet" (04/27/2012)  . Cancer ([place]) 05   rt arm mole rem cancer  . Chronic bronchitis ([place])    "keep it all the time" (04/27/2012)  . COPD (chronic obstructive pulmonary disease) ([place])   . Depression   . Diverticulosis   . DJD (degenerative joint disease)   . Emphysema   . Epilepsy ([place])   . Esophageal stricture   . Exertional dyspnea   . GERD (gastroesophageal reflux disease)   . Glaucoma   . H/O blood clots    Right leg  . Hyperlipidemia   . Hypertension   . IBS (irritable bowel syndrome)   . Migraine   . Neck pain    Cervical disc  . OSA on CPAP    6-7 yrs; pt wears CPAP but has not been able to use it lately due to sinus issues  . Pneumonia 2012; 04/27/2012  . Right leg DVT ([place]) 1982   groin  . Seizures ([place])   . Type II diabetes mellitus ([place])    No meds since weight loss  . UTI (urinary tract infection)    2 weeks ago  . Vertigo 10/24/2014   Past Surgical History:  Procedure Laterality Date  . ABDOMINAL HYSTERECTOMY  2001   "partial"  . CARPAL TUNNEL RELEASE  ~ 2008   right  . CATARACT  EXTRACTION Bilateral   . COLONOSCOPY WITH ESOPHAGOGASTRODUODENOSCOPY (EGD)    . KNEE ARTHROSCOPY  1990's   left  . NASAL SEPTOPLASTY W/ TURBINOPLASTY Bilateral 05/23/2016   Procedure: NASAL SEPTOPLASTY WITH TURBINATE REDUCTION;  Surgeon: Jerrell Belfast, MD;  Location: [facility];  Service: ENT;  Laterality: Bilateral;  . SHOULDER OPEN ROTATOR CUFF REPAIR  ~ 2010   left  . SINUS ENDO WITH FUSION Right 05/23/2016   Procedure: LIMITED RIGHT ENDOSCOPIC SINUS SURGERY;  Surgeon: Jerrell Belfast, MD;  Location: [facility];  Service: ENT;  Laterality: Right;  . TOTAL KNEE ARTHROPLASTY  08/23/2012   right  . TOTAL KNEE ARTHROPLASTY  08/23/2012   Procedure: TOTAL KNEE ARTHROPLASTY;  Surgeon: Lorn Junes, MD;  Location: [facility];  Service: Orthopedics;  Laterality: Right;  RIGHT KNEE ARTHROPLASTY MEDIAL AND LATERAL COMPARTMENTS WITH PATELLA RESURFACING  . TUBAL LIGATION  2001   Family History  Problem Relation Age of Onset  . Alcohol abuse Father   . Lung cancer Father     was a smoker  . Emphysema Father     was a smoker  . Heart disease Sister   . Lung cancer Mother  was a smoker  . Stroke Mother   . Other Sister     blood clotting disorder  . Seizures Brother   . Heart disease Maternal Grandfather   . Diabetes Maternal Grandfather   . Heart disease      uncle  . Diabetes      uncles/aunts  . Breast cancer Maternal Aunt   . Bipolar disorder Son   . Muscular dystrophy Son   . Heart disease Son   . Colon cancer Neg Hx    Social History  Substance Use Topics  . Smoking status: Current Every Day Smoker    Packs/day: 1.00    Years: 40.00    Types: Cigarettes    Start date: 08/12/1975  . Smokeless tobacco: Former Systems developer    Quit date: 08/23/2012     Comment: started back smoking in 11/2012  . Alcohol use No   OB History    No data available     Review of Systems  Constitutional: Negative.   HENT: Negative.   Respiratory: Positive for cough, shortness of breath and wheezing.    Cardiovascular: Negative for chest pain.  Gastrointestinal: Negative.   Musculoskeletal: Negative.   Neurological: Negative.     Allergies  Amoxicillin; Azithromycin; Chantix [varenicline]; Doxycycline hyclate; Levofloxacin; Penicillins; Sulfonamide derivatives; Clarithromycin; Nortriptyline; Codeine; Fluticasone-salmeterol; and Triptans  Home Medications   Prior to Admission medications   Medication Sig Start Date End Date Taking? Authorizing Provider  acetaminophen (TYLENOL) 500 MG tablet Take 1,000 mg by mouth every other day.   Yes Historical Provider, MD  albuterol (PROVENTIL) (2.5 MG/3ML) 0.083% nebulizer solution USE 1 VIAL IN NEBULIZER EVERY 6 HOURS AS NEEDED FOR SHORTNESS OF BREATH 10/08/16  Yes Hillary Corinda Gubler, MD  CALCITRATE 950 MG tablet TAKE 1 TABLET BY MOUTH TWO TIMES DAILY 10/08/16  Yes [name], MD  Cholecalciferol (VITAMIN D3) 400 units tablet TAKE 2 TABLETS BY MOUTH DAILY 10/08/16  Yes Hillary Corinda Gubler, MD  docusate sodium (COLACE) 100 MG capsule TAKE 1 CAPSULE BY MOUTH 2 TIMES DAILY AS NEEDED FOR MILD CONSTIPATION. 10/08/16  Yes Hillary Corinda Gubler, MD  DULERA 200-5 MCG/ACT AERO INHALE 2 PUFFS INTO THE LUNGS TWO TIMES DAILY 10/08/16  Yes Hillary Corinda Gubler, MD  ferrous sulfate 325 (65 FE) MG tablet Take 1 tablet (325 mg total) by mouth 2 (two) times daily with a meal. Patient taking differently: Take 325 mg by mouth daily with breakfast.  09/06/12  Yes Donita Brooks, NP  fluticasone (FLONASE) 50 MCG/ACT nasal spray USE 2 SPRAYS INTO EACH NOSTRIL DAILY AS NEEDED FOR CONGESTION. 10/08/16  Yes Hillary Corinda Gubler, MD  lamoTRIgine (LAMICTAL) 200 MG tablet Take 1 tablet (200 mg total) by mouth 2 (two) times daily. 06/11/16  Yes Dennie Bible, NP  loratadine (CLARITIN) 10 MG tablet TAKE 1 TABLET BY MOUTH EVERY DAY 10/08/16  Yes Hillary Corinda Gubler, MD  NON FORMULARY Use CPAP at bedtime   Yes Historical Provider, MD  omeprazole  (PRILOSEC) 20 MG capsule TAKE ONE CAPSULE BY MOUTH 2 TIMES A DAY BEFORE A MEAL 10/08/16  Yes Hillary Corinda Gubler, MD  polyethylene glycol powder (GLYCOLAX/MIRALAX) powder Take 17 g by mouth daily. 06/16/16  Yes Hillary Corinda Gubler, MD  PROAIR HFA 108 [phone] Base) MCG/ACT inhaler INHALE 2 PUFFS INTO THE LUNGS EVERY 6 HOURS AS NEEDED FOR SHORTNESS OF BREATH 10/08/16  Yes Hillary Corinda Gubler, MD  simvastatin (ZOCOR) 40 MG tablet TAKE 1 TABLET BY MOUTH AT BEDTIME 10/08/16  Yes [name], MD  topiramate (TOPAMAX) 100 MG tablet TAKE ONE TABLET BY MOUTH EVERY MORNING AND TAKE TWO TABLETS BY MOUTH AT BEDTIME 06/11/16  Yes Dennie Bible, NP  meloxicam (MOBIC) 7.5 MG tablet Take 1 tablet (7.5 mg total) by mouth daily. 02/26/16   Everrett Coombe, MD  polyethylene glycol powder (GLYCOLAX/MIRALAX) powder MIX 17 GMS IN LIQUID AND DRINK ONCE DAILY 08/13/16   [name], MD  polyethylene glycol powder (GLYCOLAX/MIRALAX) powder MIX 17 GMS IN LIQUID AND DRINK ONCE DAILY 09/07/16   [name], MD  polyethylene glycol powder (GLYCOLAX/MIRALAX) powder MIX 17 GMS IN LIQUID AND DRINK ONCE DAILY 10/08/16   [name], MD  predniSONE (DELTASONE) 50 MG tablet 1 tab po daily for 6 days. Take with food. 10/23/16   Janne Napoleon, NP  QUEtiapine (SEROQUEL) 50 MG tablet Take 1 tablet (50 mg total) by mouth at bedtime. 06/11/16   Dennie Bible, NP  sertraline (ZOLOFT) 50 MG tablet TAKE 1 TABLET BY MOUTH DAILY. 10/08/16   Hillary Corinda Gubler, MD   Meds Ordered and Administered this Visit   Medications  ipratropium-albuterol (DUONEB) 0.5-2.5 (3) MG/3ML nebulizer solution 3 mL (3 mLs Nebulization Given 10/23/16 2054)    BP (!) 116/58 (BP Location: Right Arm)   Pulse 94   Temp 98.6 F (37 C) (Oral)   SpO2 93% Comment: fluctuates 89-93 No data found.   Physical Exam  Constitutional: She is oriented to person, place, and time. She appears well-developed and  well-nourished.  Eyes: EOM are normal.  Neck: Neck supple.  Cardiovascular: Normal rate, regular rhythm and normal heart sounds.   Pulmonary/Chest:  At rest patient appears to be in no distress. Auscultation reveals very poor air movement, diffuse wheezing and crackles on inspiration and expiration.  Neurological: She is alert and oriented to person, place, and time.  Skin: Skin is warm.  Psychiatric: She has a normal mood and affect.  Nursing note and vitals reviewed.   Urgent Care Course     Procedures (including critical care time)  Labs Review Labs Reviewed - No data to display  Imaging Review Dg Chest 2 View  Result Date: 10/23/2016 CLINICAL DATA:  Shortness of breath.  Cough and headache. EXAM: CHEST  2 VIEW COMPARISON:  11/07/2015 FINDINGS: The heart size and mediastinal contours are within normal limits. Chronic bronchitic changes noted bilaterally. No superimposed airspace consolidation. The visualized skeletal structures are unremarkable. IMPRESSION: Key 1. Chronic bronchitic changes. Electronically Signed   By: Kerby Moors M.D.   On: 10/23/2016 21:06     Visual Acuity Review  Right Eye Distance:   Left Eye Distance:   Bilateral Distance:    Right Eye Near:   Left Eye Near:    Bilateral Near:         MDM   1. Chronic bronchitis with acute exacerbation ([place])   2. Tobacco abuse disorder    User nebulizer every 4 hours as needed. Start taking the prednisone as directed. If you get worse or have more trouble breathing or develop fevers she will need to go to the emergency department.  Meds ordered this encounter  Medications  . ipratropium-albuterol (DUONEB) 0.5-2.5 (3) MG/3ML nebulizer solution 3 mL  . predniSONE (DELTASONE) 50 MG tablet    Sig: 1 tab po daily for 6 days. Take with food.    Dispense:  6 tablet    Refill:  0    Order Specific Question:   Supervising Provider  Answer:   Sherlene Shams [[id number]]   Post DuoNeb the patient states she  is feeling better and indeed she is moving more air. Modest decrease in wheezing. The patient states she feels like going home. She was given the option to go home and use her nebulizers every 4 hours as needed or to go to the merchant department to get more frequent nebulizers and possibly IV medication. She states she would rather go home and use her nebulizers. She is advised that if she gets worse as listed above she is to go to emergency department.   Janne Napoleon, NP 10/23/16 2129    Janne Napoleon, NP 10/23/16 2131

## 2016-11-04 ENCOUNTER — Other Ambulatory Visit: Payer: Self-pay | Admitting: Internal Medicine

## 2016-11-04 ENCOUNTER — Encounter: Payer: Self-pay | Admitting: Internal Medicine

## 2016-11-04 ENCOUNTER — Ambulatory Visit (INDEPENDENT_AMBULATORY_CARE_PROVIDER_SITE_OTHER): Payer: Medicaid Other | Admitting: Internal Medicine

## 2016-11-04 VITALS — BP 138/68 | HR 96 | Temp 97.7°F | Ht 61.0 in | Wt 173.4 lb

## 2016-11-04 DIAGNOSIS — J441 Chronic obstructive pulmonary disease with (acute) exacerbation: Secondary | ICD-10-CM

## 2016-11-04 DIAGNOSIS — R51 Headache: Secondary | ICD-10-CM | POA: Diagnosis present

## 2016-11-04 DIAGNOSIS — K219 Gastro-esophageal reflux disease without esophagitis: Secondary | ICD-10-CM

## 2016-11-04 DIAGNOSIS — R519 Headache, unspecified: Secondary | ICD-10-CM

## 2016-11-04 DIAGNOSIS — K5903 Drug induced constipation: Secondary | ICD-10-CM

## 2016-11-04 MED ORDER — PREDNISONE 10 MG PO TABS: 10.0000 mg | ORAL_TABLET | Freq: Every day | ORAL | 0 refills | Status: DC

## 2016-11-04 MED ORDER — CEFDINIR 300 MG PO CAPS: 300.0000 mg | ORAL_CAPSULE | Freq: Two times a day (BID) | ORAL | 0 refills | Status: DC

## 2016-11-04 MED ORDER — KETOROLAC TROMETHAMINE 30 MG/ML IJ SOLN
30.0000 mg | Freq: Once | INTRAMUSCULAR | Status: AC
  Administered 2016-11-04: 30 mg via INTRAMUSCULAR

## 2016-11-04 NOTE — Progress Notes (Signed)
   Subjective:   Patient: [name]       Birthdate: 01-10-1956       MRN: [id number]      HPI  [name] is a 61 y.o. female presenting for same day visit for persistent cough.   Persistent cough Patient initially seen at [facility] on 02/07 for this issue. She was diagnosed with acute bronchitis vs COPD exacerbation at that time, and prescribed cefdinir and prednisone burst. Patient says her symptoms improved after this, but she was then exposed to her granddaughter who had the flu, and symptoms returned. She was evaluated in ED on 3/15 for cough. CXR at that time was consistent with bronchitis, so she was discharged with 6d course of prednisone and told to continue albuterol neb treatments as needed. Patient returns today because her symptoms have not improved.  Patient reports that since completing most recent prednisone burst about a week ago, her symptoms have not improved at all. She is now having more difficulty breathing when talking and walking. She is still using albuterol nebs, now up to 3 times a day, with minimal relief. She is taking her normal Dulera as prescribed. She is still smoking daily, but says she has cut down to 1/2 pack per day while she has been sick because she has been sleeping more. Has been taking Robitussin and Mucinex with no improvement in symptoms. Cough is productive of green sputum without blood. Denies fevers.   Smoking status reviewed. Patient is current every day smoker.   Review of Systems See HPI.     Objective:  Physical Exam  Constitutional: She is oriented to person, place, and time.  Appears older than stated age. Sitting on exam table in NAD.   HENT:  Head: Normocephalic and atraumatic.  Mouth/Throat: Oropharynx is clear and moist. No oropharyngeal exudate.  Eyes: Conjunctivae and EOM are normal. Right eye exhibits no discharge. Left eye exhibits no discharge.  Cardiovascular: Normal rate, regular rhythm and normal heart sounds.   No murmur  heard. Pulmonary/Chest:  Able to speak in full sentences with normal WOB on RA. Prominent wheezing heard bilaterally on auscultation. Some air movement but lungs sound tight. No crackles.   Neurological: She is alert and oriented to person, place, and time.  Skin: Skin is warm and dry.  Psychiatric: Affect and judgment normal.      Assessment & Plan:  Chronic obstructive pulmonary disease (HCC) Current symptoms likely 2/2 COPD exacerbation however bronchitis is on differential. Initial improvement last month with course of antibiotics, but no improvement with recent course of steroids alone. Normal WOB on RA with O2 sat 95%, but significant wheezing on auscultation. Given two steroid bursts in past ~6 weeks, will begin longer steroid taper. Also had long discussion with patient regarding tobacco cessation, including discussing that her symptoms will not improve and will only worsen as she continues to smoke, putting her at high risk for needing supplemental O2 and being hospitalized.  - Begin 14d prednisone burst (50mg  x5d, 30mg  x5d, 20mg  x2d, 10mg x2d) - Begin cefdinir 300mg  BID x10d - Continue albuterol nebs PRN - Continue Dulera - Could consider beginning LAMA or changing to LABA/LAMA combo when exacerbation has resolved - Continue alcohol cessation counseling at subsequent visits - Precepted with Dr. Nani Ravens.   Veronica Hector, MD, MPH PGY-2 [facility] Pager [phone]

## 2016-11-04 NOTE — Assessment & Plan Note (Signed)
Current symptoms likely 2/2 COPD exacerbation however bronchitis is on differential. Initial improvement last month with course of antibiotics, but no improvement with recent course of steroids alone. Normal WOB on RA with O2 sat 95%, but significant wheezing on auscultation. Given two steroid bursts in past ~6 weeks, will begin longer steroid taper. Also had long discussion with patient regarding tobacco cessation, including discussing that her symptoms will not improve and will only worsen as she continues to smoke, putting her at high risk for needing supplemental O2 and being hospitalized.  - Begin 14d prednisone burst (50mg  x5d, 30mg  x5d, 20mg  x2d, 10mg x2d) - Begin cefdinir 300mg  BID x10d - Continue albuterol nebs PRN - Continue Dulera - Could consider beginning LAMA or changing to LABA/LAMA combo when exacerbation has resolved - Continue alcohol cessation counseling at subsequent visits

## 2016-11-04 NOTE — Patient Instructions (Addendum)
It was nice meeting you today [name]!  Please begin taking the antibiotic (cefdinir) one tablet twice a day for the next 10 days.   Also please begin the two-week prednisone taper as follows: 50mg  (5 tablets) once a day for 5 days 30mg  (3 tablets) once a day for 5 days 20mg  (2 tablets) once a day for 2 days 10mg  (1/2 tablet) once a day for 2 days  You can continue to use your albuterol nebulizer as needed.   It is VERY IMPORTANT for you to stop smoking. It will be difficult for your symptoms to improve significantly as long as you continue to smoke.   If you start to have more difficulty breathing, or are not getting better by the time you have completed the antibiotics and steroids, please schedule another appointment.   If you have any questions or concerns, please feel free to call the clinic.   Be well,  Dr. Avon Gully

## 2016-11-11 ENCOUNTER — Ambulatory Visit (HOSPITAL_BASED_OUTPATIENT_CLINIC_OR_DEPARTMENT_OTHER): Payer: Medicaid Other | Admitting: Physical Medicine & Rehabilitation

## 2016-11-11 ENCOUNTER — Encounter: Payer: Medicaid Other | Attending: Physical Medicine & Rehabilitation

## 2016-11-11 ENCOUNTER — Encounter: Payer: Self-pay | Admitting: Physical Medicine & Rehabilitation

## 2016-11-11 VITALS — BP 111/72 | HR 86 | Resp 14

## 2016-11-11 DIAGNOSIS — G43711 Chronic migraine without aura, intractable, with status migrainosus: Secondary | ICD-10-CM

## 2016-11-11 DIAGNOSIS — G43809 Other migraine, not intractable, without status migrainosus: Secondary | ICD-10-CM | POA: Insufficient documentation

## 2016-11-11 NOTE — Patient Instructions (Signed)

## 2016-11-11 NOTE — Progress Notes (Signed)
Botox injection for chronic migraine prophylaxis.  Indication: History of migraines with greater than 15 headaches per month despite trials of oral medications.  Informed consent was obtained after discussing risks and benefits of the procedure with the patient this included bleeding bruising infection as well as facial drooping, eyelid lag, systemic effects of Botox. Patient elects to proceed and has given written consent.  Patient placed in a seated position Dilution 50 units per cc  Muscles injected with dose  Procerus 5 units Corrugator 5 units bilateral Frontalis 5 units 2 injection sites on the right and 2 injection sites on the left Temporalis 5 units in 4 injection sites on the right and 4 injection sites on the left Occipitalis 5 units into 3 injections sites on the right and 3 injection sites on the left  Cervical paraspinals 5 units into 2 injection sites on the right and 2 injection sites on the left trapezius 5 units into 3 injection sites on the right and 3 injection sites on the left  Patient tolerated procedure well. Post procedure instructions given. Appointment for repeat injection in 3 months

## 2016-12-03 ENCOUNTER — Other Ambulatory Visit: Payer: Self-pay | Admitting: Internal Medicine

## 2016-12-03 DIAGNOSIS — K219 Gastro-esophageal reflux disease without esophagitis: Secondary | ICD-10-CM

## 2016-12-03 DIAGNOSIS — K5903 Drug induced constipation: Secondary | ICD-10-CM

## 2016-12-05 ENCOUNTER — Ambulatory Visit: Payer: Medicaid Other | Admitting: Internal Medicine

## 2016-12-09 ENCOUNTER — Ambulatory Visit: Payer: Medicaid Other | Admitting: Nurse Practitioner

## 2016-12-17 ENCOUNTER — Encounter (INDEPENDENT_AMBULATORY_CARE_PROVIDER_SITE_OTHER): Payer: Self-pay

## 2016-12-17 ENCOUNTER — Ambulatory Visit (INDEPENDENT_AMBULATORY_CARE_PROVIDER_SITE_OTHER): Payer: Medicaid Other | Admitting: Nurse Practitioner

## 2016-12-17 ENCOUNTER — Encounter: Payer: Self-pay | Admitting: Nurse Practitioner

## 2016-12-17 VITALS — BP 132/80 | HR 81 | Ht 61.0 in | Wt 172.2 lb

## 2016-12-17 DIAGNOSIS — G43711 Chronic migraine without aura, intractable, with status migrainosus: Secondary | ICD-10-CM | POA: Diagnosis not present

## 2016-12-17 DIAGNOSIS — G40909 Epilepsy, unspecified, not intractable, without status epilepticus: Secondary | ICD-10-CM

## 2016-12-17 MED ORDER — QUETIAPINE FUMARATE 50 MG PO TABS: 50.0000 mg | ORAL_TABLET | Freq: Every day | ORAL | 8 refills | Status: DC

## 2016-12-17 NOTE — Patient Instructions (Signed)
Continue Lamictal 200mg  twice daily  Continue Topamax  100mg  in the am and 200mg  in the pm  Continue Seroquel at current dose will refill Continue Botox for migraines Stay well hydrated F/U in 8 months

## 2016-12-17 NOTE — Progress Notes (Signed)
I have read the note, and I agree with the clinical assessment and plan.  Autrey Human KEITH   

## 2016-12-17 NOTE — Progress Notes (Signed)
[facility]  PATIENT: Veronica [name] DOB: [birth year]   REASON FOR VISIT: Follow-up for seizure disorder and intractable migraines, depression HISTORY FROM: Patient    HISTORY OF PRESENT ILLNESS:UPDATE 5/9/ 2018CM Veronica [name], 61 year old [name] returns for follow-up with history of intractable migraines and a history of seizure disorder. She is on Topamax 100 mg 1 in the morning and 2 at night. She also gets Botox every 3 months. She continues to have mild headaches at times but the Botox has been very beneficial for her. She just had Botox last month. Last seizure occurred in September 2016. She is currently on Lamictal 200 twice a day. She denies side effects to her medication. She remains on Seroquel for depression and anxiety. She has some problems with left knee  giving out at times. She has seen an orthopedist in the knee replacement but she continues to put that off. She has had a replacement on the right knee previously. Blood pressure well controlled in the office today 132/80. She returns for reevaluation   UPDATE 11/01/2017CM: Veronica [name], 61 year old [name] returns for follow-up. She has a history of intractable migraines and a history of seizure disorder. Last seizure occurred in September 2016. She is on Lamictal 200 mg twice a day and Topamax 100 mg 1 in the morning and 2 at night. She had sinus surgery two weeks ago and she's had more headaches. She's been taking oxycodone. She was referred for Botox with Dr. Ella Bodo and has been getting Botox every 3 months. She is due now. She also complains that she is having trouble urinating and is due to see her primary on Friday. Orthostatic hypotension noted today on exam standing pressure was 90/60. She denies any [name] but she does complains of dizziness she returns for reevaluation. She remains on Seroquel.   4/3/17CMMs. [name] is a 61 year old right-handed white [name] with a history of intractable epilepsy, and intractable  headache. The patient is on lamotrigine and Topamax without full control of the seizures, the patient was sent for video EEG monitoring study at [facility], and the patient did not have any seizure episodes during the monitoring period. The patient has not had any seizures since the video EEG, this was done in September 2016.  The patient is not operating a motor vehicle. She still has frequent headaches, she may have 7 headache free days a month only. The headaches when they do come on last all day long. The patient has been tried on a multitude of medications including lamotrigine, Topamax, nortriptyline, Seroquel, hydrocodone, Advil, and Tylenol. The patient cannot take triptan medications. She may have some nausea, usually not vomiting with the headache. The headaches are impacting her ability to function during the day. She has an appointment to be evaluated for Botox tomorrow and she was encouraged to keep that appointment. She also had recent CT of the sinuses with  significant sinus disease and was referred to ENT. The patient returns to the office today for an evaluation.   REVIEW OF SYSTEMS: Full 14 system review of systems performed and notable only for those listed, all others are neg:  Constitutional: neg  Cardiovascular: neg Ear/Nose/Throat: neg  Skin: neg Eyes: Blurred vision  Respiratory: Cough chronic Gastroitestinal: neg Genitourinary neg Hematology/Lymphatic: neg  Endocrine: Excessive thirst Musculoskeletal: Back pain, neck pain Allergy/Immunology: neg Neurological: Headache, seizures, tremors, dizziness Psychiatric: Depression and anxiety Sleep : neg   ALLERGIES: Allergies  Allergen Reactions  . Amoxicillin Anaphylaxis    Breakout, mouth swells  .  Azithromycin Swelling and Rash    "swelling all over, high, thrush"  . Chantix [Varenicline] Other (See Comments)    "bad dreams, difficulty breathing"  . Doxycycline Hyclate Anaphylaxis and Rash    swelling  . Levofloxacin  Anaphylaxis  . Penicillins Anaphylaxis, Shortness Of Breath and Rash    "ALMOST DIED, ended up in hospital"  PCN reaction causing immediate rash, facial/tongue/throat swelling, SOB or lightheadedness with hypotension: YES PCN reaction causing severe rash involving mucus membranes or skin necrosis: NO PCN reaction that required hospitalization YES Has patient had a PCN reaction occurring within the last 10 years: NO   . Sulfonamide Derivatives Shortness Of Breath, Swelling and Rash    "break out from head to toe"  . Clarithromycin Hives    swelling  . Nortriptyline Swelling, Rash and Other (See Comments)    Per patient had "kidney problems" on this medication, could not use bathroom (urinary retention)  . Codeine Nausea And Vomiting  . Fluticasone-Salmeterol Other (See Comments)    REACTION: ulcers in mouth  . Triptans Rash    Mouth breaks out and start itching    HOME MEDICATIONS: Outpatient Medications Prior to Visit  Medication Sig Dispense Refill  . acetaminophen (TYLENOL) 500 MG tablet Take 1,000 mg by mouth every other day.    . albuterol (PROVENTIL) (2.5 MG/3ML) 0.083% nebulizer solution USE 1 VIAL IN NEBULIZER EVERY 6 HOURS AS NEEDED FOR SHORTNESS OF BREATH 75 mL 0  . CALCITRATE 950 MG tablet TAKE 1 TABLET BY MOUTH TWO TIMES DAILY 60 tablet 0  . cefdinir (OMNICEF) 300 MG capsule Take 1 capsule (300 mg total) by mouth 2 (two) times daily. 20 capsule 0  . Cholecalciferol (VITAMIN D3) 400 units tablet TAKE 2 TABLETS BY MOUTH DAILY 60 tablet 0  . docusate sodium (COLACE) 100 MG capsule TAKE 1 CAPSULE BY MOUTH 2 TIMES DAILY AS NEEDED FOR MILD CONSTIPATION. 30 capsule 0  . DULERA 200-5 MCG/ACT AERO INHALE 2 PUFFS INTO THE LUNGS TWO TIMES DAILY 13 g 0  . ferrous sulfate 325 (65 FE) MG tablet Take 1 tablet (325 mg total) by mouth 2 (two) times daily with a meal. (Patient taking differently: Take 325 mg by mouth daily with breakfast. ) 60 tablet 3  . fluticasone (FLONASE) 50 MCG/ACT  nasal spray USE 2 SPRAYS INTO EACH NOSTRIL DAILY AS NEEDED FOR CONGESTION. 16 g 0  . lamoTRIgine (LAMICTAL) 200 MG tablet Take 1 tablet (200 mg total) by mouth 2 (two) times daily. 60 tablet 11  . loratadine (CLARITIN) 10 MG tablet TAKE 1 TABLET BY MOUTH EVERY DAY 30 tablet 0  . meloxicam (MOBIC) 7.5 MG tablet Take 1 tablet (7.5 mg total) by mouth daily. 30 tablet 0  . NON FORMULARY Use CPAP at bedtime    . omeprazole (PRILOSEC) 20 MG capsule TAKE ONE CAPSULE BY MOUTH 2 TIMES A DAY BEFORE A MEAL 60 capsule 0  . polyethylene glycol powder (GLYCOLAX/MIRALAX) powder Take 17 g by mouth daily. (Patient taking differently: Take 17 g by mouth daily as needed. ) 527 g 1  . PROAIR HFA 108 (90 Base) MCG/ACT inhaler INHALE 2 PUFFS INTO THE LUNGS EVERY 6 HOURS AS NEEDED FOR SHORTNESS OF BREATH 8.5 g 0  . sertraline (ZOLOFT) 50 MG tablet TAKE 1 TABLET BY MOUTH DAILY. 30 tablet 0  . simvastatin (ZOCOR) 40 MG tablet TAKE 1 TABLET BY MOUTH AT BEDTIME 30 tablet 0  . topiramate (TOPAMAX) 100 MG tablet TAKE ONE TABLET BY MOUTH EVERY  MORNING AND TAKE TWO TABLETS BY MOUTH AT BEDTIME 270 tablet 3  . QUEtiapine (SEROQUEL) 50 MG tablet Take 1 tablet (50 mg total) by mouth at bedtime. 30 tablet 6  . polyethylene glycol powder (GLYCOLAX/MIRALAX) powder MIX 17 GMS IN LIQUID AND DRINK ONCE DAILY (Patient not taking: Reported on 12/17/2016) 527 g 0  . polyethylene glycol powder (GLYCOLAX/MIRALAX) powder MIX 17 GMS IN LIQUID AND DRINK ONCE DAILY (Patient not taking: Reported on 12/17/2016) 527 g 0  . polyethylene glycol powder (GLYCOLAX/MIRALAX) powder MIX 17 GMS IN LIQUID AND DRINK ONCE DAILY (Patient not taking: Reported on 12/17/2016) 527 g 0  . polyethylene glycol powder (GLYCOLAX/MIRALAX) powder MIX 17 GMS IN LIQUID AND DRINK ONCE DAILY (Patient not taking: Reported on 12/17/2016) 527 g 0  . polyethylene glycol powder (GLYCOLAX/MIRALAX) powder MIX 17 GMS IN LIQUID AND DRINK ONCE DAILY (Patient not taking: Reported on 12/17/2016) 527 g  0  . predniSONE (DELTASONE) 10 MG tablet Take 1 tablet (10 mg total) by mouth daily with breakfast. (Patient not taking: Reported on 12/17/2016) 41 tablet 0  . predniSONE (DELTASONE) 50 MG tablet 1 tab po daily for 6 days. Take with food. (Patient not taking: Reported on 12/17/2016) 6 tablet 0   No facility-administered medications prior to visit.     PAST MEDICAL HISTORY: Past Medical History:  Diagnosis Date  . Allergic rhinitis   . Arthritis    "hands and legs and feet" (04/27/2012)  . Cancer ([place]) 05   rt arm mole rem cancer  . Chronic bronchitis ([place])    "keep it all the time" (04/27/2012)  . COPD (chronic obstructive pulmonary disease) ([place])   . Depression   . Diverticulosis   . DJD (degenerative joint disease)   . Emphysema   . Epilepsy ([place])   . Esophageal stricture   . Exertional dyspnea   . GERD (gastroesophageal reflux disease)   . Glaucoma   . H/O blood clots    Right leg  . Hyperlipidemia   . Hypertension   . IBS (irritable bowel syndrome)   . Migraine   . Neck pain    Cervical disc  . OSA on CPAP    6-7 yrs; pt wears CPAP but has not been able to use it lately due to sinus issues  . Pneumonia 2012; 04/27/2012  . Right leg DVT ([place]) 1982   groin  . Seizures ([place])   . Type II diabetes mellitus ([place])    No meds since weight loss  . UTI (urinary tract infection)    2 weeks ago  . Vertigo 10/24/2014    PAST SURGICAL HISTORY: Past Surgical History:  Procedure Laterality Date  . ABDOMINAL HYSTERECTOMY  2001   "partial"  . CARPAL TUNNEL RELEASE  ~ 2008   right  . CATARACT EXTRACTION Bilateral   . COLONOSCOPY WITH ESOPHAGOGASTRODUODENOSCOPY (EGD)    . KNEE ARTHROSCOPY  1990's   left  . NASAL SEPTOPLASTY W/ TURBINOPLASTY Bilateral 05/23/2016   Procedure: NASAL SEPTOPLASTY WITH TURBINATE REDUCTION;  Surgeon: Jerrell Belfast, MD;  Location: [facility];  Service: ENT;  Laterality: Bilateral;  . SHOULDER OPEN ROTATOR CUFF REPAIR  ~ 2010   left  . SINUS ENDO WITH  FUSION Right 05/23/2016   Procedure: LIMITED RIGHT ENDOSCOPIC SINUS SURGERY;  Surgeon: Jerrell Belfast, MD;  Location: [facility];  Service: ENT;  Laterality: Right;  . TOTAL KNEE ARTHROPLASTY  08/23/2012   right  . TOTAL KNEE ARTHROPLASTY  08/23/2012   Procedure: TOTAL KNEE ARTHROPLASTY;  Surgeon: Lorn Junes, MD;  Location: [facility];  Service: Orthopedics;  Laterality: Right;  RIGHT KNEE ARTHROPLASTY MEDIAL AND LATERAL COMPARTMENTS WITH PATELLA RESURFACING  . TUBAL LIGATION  2001    FAMILY HISTORY: Family History  Problem Relation Age of Onset  . Alcohol abuse Father   . Lung cancer Father     was a smoker  . Emphysema Father     was a smoker  . Heart disease Sister   . Lung cancer Mother     was a smoker  . Stroke Mother   . Other Sister     blood clotting disorder  . Seizures Brother   . Heart disease Maternal Grandfather   . Diabetes Maternal Grandfather   . Heart disease      uncle  . Diabetes      uncles/aunts  . Breast cancer Maternal Aunt   . Bipolar disorder Son   . Muscular dystrophy Son   . Heart disease Son   . Colon cancer Neg Hx     SOCIAL HISTORY: Social History   Social History  . Marital status: Married    Spouse name: [name]  . Number of children: 2  . Years of education: 12   Occupational History  . disabled    Social History Main Topics  . Smoking status: Current Every Day Smoker    Packs/day: 1.00    Years: 40.00    Types: Cigarettes    Start date: 08/12/1975  . Smokeless tobacco: Former Systems developer    Quit date: 08/23/2012     Comment: started back smoking in 11/2012  . Alcohol use No  . Drug use: No  . Sexual activity: No   Other Topics Concern  . Not on file   Social History Narrative   Patient lives at home with husband [name], her son and his wife.    Patient has 2 children.    Patient has a high school education.    Patient is currently unemployed.    Patient is right handed.    Patient drinks 2 cups of caffeine per day.         PHYSICAL EXAM  Vitals:   06/11/16 1528  BP: 132/80  Pulse: 81  Weight: 174 lb (78.9 kg)  Height: 5\' 1"  (1.549 m)   Body mass index is 32.54 kg/m.  Generalized: Well developed, obese [name] in no acute distress  Head: normocephalic and atraumatic,. Oropharynx benign  Neck: Supple, no carotid bruits  Cardiac: Regular rate rhythm, no murmur  Musculoskeletal: No deformity   Neurological examination   Mentation: Alert oriented to time, place, history taking. Attention span and concentration appropriate. Recent and remote memory intact.  Follows all commands speech and language fluent.   Cranial nerve II-XII: Pupils were equal round reactive to light extraocular movements were full, visual field were full on confrontational test. Facial sensation and strength were normal. hearing was intact to finger rubbing bilaterally. Uvula tongue midline. head turning and shoulder shrug were normal and symmetric.Tongue protrusion into cheek strength was normal. Motor: normal bulk and tone, full strength in the BUE, BLE,  Sensory: normal and symmetric to light touch, pinprick, and  Vibration in the upper and lower extremities,  Coordination: finger-nose-finger, heel-to-shin bilaterally, no dysmetria Reflexes: Symmetric upper and lower, plantar responses were flexor bilaterally. Gait and Station: Rising up from seated position without assistance, wide base unsteady gait , tandem gait not attempted Romberg is negative . No assistive device  DIAGNOSTIC DATA (LABS, IMAGING,  TESTING) - I reviewed patient records, labs, notes, testing and imaging myself where available.  Lab Results  Component Value Date   WBC 10.7 (H) 05/15/2016   HGB 12.0 05/15/2016   HCT 37.1 05/15/2016   MCV 96.1 05/15/2016   PLT 204 05/15/2016      Component Value Date/Time   NA 141 06/13/2016 1441   NA 144 02/08/2015 1453   K 4.3 06/13/2016 1441   CL 111 (H) 06/13/2016 1441   CO2 20 06/13/2016 1441   GLUCOSE 82  06/13/2016 1441   BUN 17 06/13/2016 1441   BUN 19 02/08/2015 1453   CREATININE 0.91 06/13/2016 1441   CALCIUM 9.1 06/13/2016 1441   PROT 7.0 03/20/2016 1622   PROT 6.7 02/08/2015 1453   ALBUMIN 4.2 03/20/2016 1622   ALBUMIN 3.9 02/08/2015 1453   AST 12 03/20/2016 1622   ALT 6 03/20/2016 1622   ALKPHOS 105 03/20/2016 1622   BILITOT 0.3 03/20/2016 1622   BILITOT <0.2 02/08/2015 1453   GFRNONAA 70 06/13/2016 1441   GFRAA 80 06/13/2016 1441       ASSESSMENT AND PLAN  61 y.o. year old [name]  has a past medical history of Intractable epilepsy and intractable migraine headaches. She has not had further seizure activity since monitoring at [facility] in September 2016. She is currently on Topamax and Lamictal. She has been receiving Botox every 3 months with Dr. Ella Bodo. Marland Kitchen   PLAN: Continue Lamictal 200mg  twice daily  Continue Topamax  100mg  in the am and 200mg  in the pm  Continue Seroquel at current dose will refill Continue Botox for migraines Stay well hydrated F/U in 8 months I spent 20 min in total face to face time with the patient more than 50% of which was spent counseling and coordination of care, reviewing test results reviewing medications and discussing and reviewing the diagnosis of intractable migraine and seizure disorder and further treatment options if necessary. , Rayburn Ma, [facility], APRN  [facility] [address] [place], [place] [address] [phone]

## 2016-12-29 ENCOUNTER — Ambulatory Visit: Payer: Medicaid Other | Admitting: Internal Medicine

## 2017-01-01 ENCOUNTER — Other Ambulatory Visit: Payer: Self-pay | Admitting: Internal Medicine

## 2017-01-01 DIAGNOSIS — K219 Gastro-esophageal reflux disease without esophagitis: Secondary | ICD-10-CM

## 2017-01-01 DIAGNOSIS — K5903 Drug induced constipation: Secondary | ICD-10-CM

## 2017-01-12 ENCOUNTER — Encounter: Payer: Self-pay | Admitting: Internal Medicine

## 2017-01-12 ENCOUNTER — Ambulatory Visit (INDEPENDENT_AMBULATORY_CARE_PROVIDER_SITE_OTHER): Payer: Medicaid Other | Admitting: Internal Medicine

## 2017-01-12 VITALS — BP 104/62 | HR 84 | Temp 98.1°F | Ht 61.0 in | Wt 172.0 lb

## 2017-01-12 DIAGNOSIS — G8929 Other chronic pain: Secondary | ICD-10-CM

## 2017-01-12 DIAGNOSIS — J42 Unspecified chronic bronchitis: Secondary | ICD-10-CM | POA: Diagnosis not present

## 2017-01-12 DIAGNOSIS — R7303 Prediabetes: Secondary | ICD-10-CM

## 2017-01-12 DIAGNOSIS — M25562 Pain in left knee: Secondary | ICD-10-CM

## 2017-01-12 DIAGNOSIS — K219 Gastro-esophageal reflux disease without esophagitis: Secondary | ICD-10-CM | POA: Diagnosis not present

## 2017-01-12 DIAGNOSIS — M1712 Unilateral primary osteoarthritis, left knee: Secondary | ICD-10-CM | POA: Diagnosis not present

## 2017-01-12 LAB — POCT GLYCOSYLATED HEMOGLOBIN (HGB A1C): HEMOGLOBIN A1C: 5.5

## 2017-01-12 MED ORDER — OMEPRAZOLE 40 MG PO CPDR: 40.0000 mg | DELAYED_RELEASE_CAPSULE | Freq: Two times a day (BID) | ORAL | 3 refills | Status: DC

## 2017-01-12 MED ORDER — DICLOFENAC SODIUM 1 % TD GEL: 2.0000 g | Freq: Two times a day (BID) | TRANSDERMAL | 2 refills | Status: DC

## 2017-01-12 NOTE — Patient Instructions (Signed)
[name],  I will send diclofenac (voltaren gel) to try on your knee. Stop the ibuprofen and continue tylenol, as ibuprofen may be worsening GERD. I will order a session of PT -- one session should be covered by Medicaid.  Your lungs sound pretty good today! Let me know if I can be helpful in your process of cutting back on smoking.   Best, Dr. Ola Spurr

## 2017-01-14 DIAGNOSIS — J42 Unspecified chronic bronchitis: Secondary | ICD-10-CM | POA: Insufficient documentation

## 2017-01-14 NOTE — Assessment & Plan Note (Signed)
-   Currently stable without increased dyspnea. - Discussed smoking cessation and patient contemplative but unable to pick quit date - Discussed that smoking cessation would also promote better healing in case of L knee surgery - Reviewed resources for cessation, including visits with [facility] pharmacist Dr. Valentina Lucks

## 2017-01-14 NOTE — Progress Notes (Signed)
[facility] Progress Note  Subjective:  [name] is a 61 y.o. female with history of chronic bronchitis, COPD, HTN, HLD, seizures, migraines and tobacco abuse who presents to follow-up bronchitis and for chronic knee pain.  #Bronchitis: - Completed steroids and antibiotics for last COPD vs bronchitis flare in April - Thinks breathing has been good lately - Continues to take dulera BID and albuterol prn - Still smoking and not currently attempting to quit. Difficulties include being around family members who smoke. Reports being unable to try chantix due to seizures. Has tried nicotine patches and lozenges in the past without success. Thinks she will have best success if goes "cold [place]."  ROS: No fevers, no increased cough or sputum production  #L knee pain: - Worse for about the past week - Has been receiving gel injections, with last injection about 2 months ago - Says had gel injections last year and got 6 months' of relief - Last steroid injection was last summer (03/2016) and provided little relief - Has tried elevating legs, has done some icing - Taking regular tylenol and ibuprofen - No recent injury to knee  ROS: Reports worsening GERD  Objective: Blood pressure 104/62, pulse 84, temperature 98.1 F (36.7 C), temperature source Oral, height 5\' 1"  (1.549 m), weight 172 lb (78 kg), SpO2 98 %. Body mass index is 32.5 kg/m. Constitutional: Obese female, pleasant, in NAD Cardiovascular: RRR, S1, S2, no m/r/g.  Pulmonary/Chest: Expiratory wheezing but good air movement throughout. No respiratory distress and speaking in complete sentences easily.   Musculoskeletal: Medial aspect of L knee more prominent than R without increased warmth (greater fat distribution there). Mild joint line tenderness to palpation of L knee with crepitus. Scar of R knee from past replacement.  Skin: Skin is warm and dry. No erythema.  Vitals reviewed  Assessment/Plan: Chronic  bronchitis (HCC) - Currently stable without increased dyspnea. - Discussed smoking cessation and patient contemplative but unable to pick quit date - Discussed that smoking cessation would also promote better healing in case of L knee surgery - Reviewed resources for cessation, including visits with [facility] pharmacist Dr. Valentina Lucks  DJD (degenerative joint disease) of knee - Acute on chronic pain. Do not see obvious effusion. Known tricompartmental OA. Patient trying to put off surgery, though anticipates L knee replacement. - Recommended continuing tylenol prn but stopping ibuprofen due to worsening reflux. Prescribed voltaren gel to try instead. If too expensive could try capsaicin. - Will order PT. Patient has Medicaid but could have an introductory visit to review exercises she could continue at home. - To consider meeting with her Orthopedic Surgeon to discuss timing of surgery  Follow-up in about 1 month if no improvement in knee pain.  Olene Floss, MD [place], PGY-2

## 2017-01-14 NOTE — Assessment & Plan Note (Signed)
-   Acute on chronic pain. Do not see obvious effusion. Known tricompartmental OA. Patient trying to put off surgery, though anticipates L knee replacement. - Recommended continuing tylenol prn but stopping ibuprofen due to worsening reflux. Prescribed voltaren gel to try instead. If too expensive could try capsaicin. - Will order PT. Patient has Medicaid but could have an introductory visit to review exercises she could continue at home. - To consider meeting with her Orthopedic Surgeon to discuss timing of surgery

## 2017-01-30 ENCOUNTER — Other Ambulatory Visit: Payer: Self-pay | Admitting: Internal Medicine

## 2017-01-30 DIAGNOSIS — K5903 Drug induced constipation: Secondary | ICD-10-CM

## 2017-02-12 ENCOUNTER — Ambulatory Visit: Payer: Medicaid Other | Admitting: Physical Therapy

## 2017-02-17 ENCOUNTER — Ambulatory Visit: Payer: Medicaid Other | Admitting: Physical Medicine & Rehabilitation

## 2017-02-26 ENCOUNTER — Other Ambulatory Visit: Payer: Self-pay | Admitting: Internal Medicine

## 2017-02-26 DIAGNOSIS — K5903 Drug induced constipation: Secondary | ICD-10-CM

## 2017-03-02 ENCOUNTER — Ambulatory Visit: Payer: Medicaid Other | Admitting: Physical Medicine & Rehabilitation

## 2017-03-13 ENCOUNTER — Encounter: Payer: Self-pay | Admitting: Internal Medicine

## 2017-03-13 ENCOUNTER — Ambulatory Visit (INDEPENDENT_AMBULATORY_CARE_PROVIDER_SITE_OTHER): Payer: Medicaid Other | Admitting: Internal Medicine

## 2017-03-13 DIAGNOSIS — M1712 Unilateral primary osteoarthritis, left knee: Secondary | ICD-10-CM | POA: Diagnosis not present

## 2017-03-13 DIAGNOSIS — M545 Low back pain: Secondary | ICD-10-CM

## 2017-03-13 DIAGNOSIS — G8929 Other chronic pain: Secondary | ICD-10-CM | POA: Diagnosis not present

## 2017-03-13 MED ORDER — MELOXICAM 15 MG PO TABS: 15.0000 mg | ORAL_TABLET | Freq: Every day | ORAL | 1 refills | Status: DC

## 2017-03-13 MED ORDER — TRAMADOL HCL 50 MG PO TABS: 50.0000 mg | ORAL_TABLET | Freq: Two times a day (BID) | ORAL | 1 refills | Status: DC | PRN

## 2017-03-13 NOTE — Assessment & Plan Note (Signed)
No improvement and still unable to find coverage for PT after surgery. Tia Hill out of office today, and was unable to find information regarding physical therapy payment assistance programs for pain clinics on [address] through [employer]. As such, will send message to [place] to request assistance with this when she returns to office. In meantime, will increase Mobic to 15mg  qd. Will also prescribe Tramadol. Discussed with patient that this is a narcotic with addictive potential and is only to be taken for severe breakthrough pain. Prescribed 50mg  BID PRN.

## 2017-03-13 NOTE — Patient Instructions (Signed)
It was nice seeing you again today [name]!  For your back pain, please begin taking one tablet (15 mg) of Mobic in the morning. You can stop taking Tylenol. Do not take ibuprofen/Aleve/Motrin while taking Mobic. You can also take one tramadol tablet up to two times a day for pain not controlled by Mobic.   We will contact you next week after our referral coordinator returns regarding the physical therapy assistance program.   If you have any questions or concerns, please feel free to call the clinic.   Be well,  Dr. Avon Gully

## 2017-03-13 NOTE — Progress Notes (Signed)
Subjective:   Patient: [name]       Birthdate: 12/01/1955       MRN: [id number]      HPI  TIMBER MARSHMAN is a 61 y.o. female presenting for f/u of chronic L knee pain.   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 She was previously receiving knee injections for this but they have become ineffective. She is followed by ortho and has met with a surgeon twice who feels that she does need a knee replacement, however Medicaid reportedly will not pay for her to have PT aftewards, and ortho does not want to perform the surgery if she will not be able to have therapy afterwards. Patient has been taking 7.65m Mobic with no improvement in symptoms. Has also used Voltaren and capsaicin with no improvement. Has heard of a pain clinic on [address] has a payment assistance program for physical therapy and is wondering if we can provide more information on that today.   Smoking status reviewed. Patient is current every day smoker.   Review of Systems See HPI.     Objective:  Physical Exam  Constitutional: She is oriented to person, place, and time and well-developed, well-nourished, and in no distress.  HENT:  Head: Normocephalic and atraumatic.  Pulmonary/Chest: Effort normal. No respiratory distress.  Musculoskeletal:  Mild swelling of L knee without erythema. Midline TTP. Crepitus present. No abnormalities of R knee. Difficulty getting onto and off of the exam table. Walking with difficulty as well but is able to walk unassisted, though slowly.   Neurological: She is alert and oriented to person, place, and time.  Skin: Skin is warm and dry.  Psychiatric: Affect and judgment normal.      Assessment & Plan:  DJD (degenerative joint disease) of knee No improvement and still unable to find coverage for PT after surgery. Tia Hill out of office today, and was unable to find information regarding physical therapy payment assistance programs for pain clinics on [address] through [employer] As such, will send message to [place] request assistance with this when she returns to office. In meantime, will increase Mobic to 120mqd. Will also prescribe Tramadol. Discussed with patient that this is a narcotic with addictive potential and is only to be taken for severe breakthrough pain. Prescribed 5022mID PRN.    AbiAdin HectorD, MPH PGY-3 [facility] Pager [phone]

## 2017-03-16 ENCOUNTER — Ambulatory Visit: Payer: Medicaid Other | Admitting: Physical Medicine & Rehabilitation

## 2017-03-24 ENCOUNTER — Telehealth: Payer: Self-pay | Admitting: Internal Medicine

## 2017-03-24 MED ORDER — PERMETHRIN 1 % EX LOTN: 1.0000 "application " | TOPICAL_LOTION | Freq: Once | CUTANEOUS | 0 refills | Status: DC

## 2017-03-24 NOTE — Telephone Encounter (Signed)
Patient called requesting medication for lice. Ordered permethrin.

## 2017-03-25 MED ORDER — PERMETHRIN 1 % EX LOTN: 1.0000 "application " | TOPICAL_LOTION | Freq: Once | CUTANEOUS | 0 refills | Status: AC

## 2017-03-25 NOTE — Addendum Note (Signed)
Addended by: Darci Needle on: 03/25/2017 08:58 AM   Modules accepted: Orders

## 2017-03-25 NOTE — Telephone Encounter (Signed)
Received [facility] stating Permethrin 5% cream only comes in lotion. Please change to lotion form.  Derl Barrow, RN

## 2017-03-25 NOTE — Telephone Encounter (Signed)
Original order was for permethrin 1% lotion. Reordered as such.

## 2017-03-26 ENCOUNTER — Telehealth: Payer: Self-pay | Admitting: *Deleted

## 2017-03-26 ENCOUNTER — Ambulatory Visit: Payer: Medicaid Other | Admitting: Physical Medicine & Rehabilitation

## 2017-03-26 NOTE — Telephone Encounter (Signed)
Letter mailed informing of discharge from clinic for frequent cancellations/no show.

## 2017-03-31 ENCOUNTER — Other Ambulatory Visit: Payer: Self-pay | Admitting: Nurse Practitioner

## 2017-03-31 ENCOUNTER — Other Ambulatory Visit: Payer: Self-pay | Admitting: Internal Medicine

## 2017-04-09 ENCOUNTER — Ambulatory Visit: Payer: Medicaid Other | Admitting: Physical Medicine & Rehabilitation

## 2017-04-09 ENCOUNTER — Ambulatory Visit: Payer: Medicaid Other

## 2017-04-30 ENCOUNTER — Other Ambulatory Visit: Payer: Self-pay | Admitting: Internal Medicine

## 2017-05-12 ENCOUNTER — Ambulatory Visit (INDEPENDENT_AMBULATORY_CARE_PROVIDER_SITE_OTHER): Payer: Medicaid Other | Admitting: Internal Medicine

## 2017-05-12 ENCOUNTER — Encounter: Payer: Self-pay | Admitting: Internal Medicine

## 2017-05-12 VITALS — BP 140/78 | HR 94 | Temp 98.0°F | Wt 175.0 lb

## 2017-05-12 DIAGNOSIS — Z23 Encounter for immunization: Secondary | ICD-10-CM | POA: Diagnosis not present

## 2017-05-12 DIAGNOSIS — R05 Cough: Secondary | ICD-10-CM | POA: Diagnosis not present

## 2017-05-12 DIAGNOSIS — R5383 Other fatigue: Secondary | ICD-10-CM | POA: Diagnosis not present

## 2017-05-12 DIAGNOSIS — B850 Pediculosis due to Pediculus humanus capitis: Secondary | ICD-10-CM | POA: Diagnosis not present

## 2017-05-12 DIAGNOSIS — R42 Dizziness and giddiness: Secondary | ICD-10-CM | POA: Diagnosis not present

## 2017-05-12 DIAGNOSIS — R059 Cough, unspecified: Secondary | ICD-10-CM

## 2017-05-12 MED ORDER — PREDNISONE 50 MG PO TABS: ORAL_TABLET | ORAL | 0 refills | Status: DC

## 2017-05-12 MED ORDER — CAPSAICIN 0.025 % EX CREA: TOPICAL_CREAM | Freq: Two times a day (BID) | CUTANEOUS | 0 refills | Status: DC

## 2017-05-12 MED ORDER — IVERMECTIN 0.5 % EX LOTN: 117.0000 g | TOPICAL_LOTION | Freq: Once | CUTANEOUS | 0 refills | Status: AC

## 2017-05-12 NOTE — Progress Notes (Signed)
[facility] Progress Note  Subjective:  [name] is a 61 y.o. female with history of arthritis, depression, HTN, migraines, and vitamin D deficiency presents for cough and dizziness.   #Cough - present for last few days - dry cough, no increased sputum production - continues to smoke cigarettes - needing to use albuterol inhaler more often, about 3 times a day - no problems taking dulera  ROS: no fever, no sick contacts  #Dizziness - reports a systolic BP of 88 at home - worse when turns head to the right; had history of BPPV but hesitant to try vestibular PT because symptoms were worse during treatment in the past - takes topamax but says she has not had issues with it and taking for years - history of tussive syncope - drinking about 4 cups of water a day ROS: No falls  #Head lice - Continues to have nits 2 months since initial episode - Has treated herself with ivermectin multiple times - Granddaughter has had recurrent episodes that she has treated - No one has been able to help patient comb out the nits in her hair - Hair starting to break from multiple treatments  Social: Current smoker  Allergies  Allergen Reactions  . Amoxicillin Anaphylaxis    Breakout, mouth swells  . Azithromycin Swelling and Rash    "swelling all over, high, thrush"  . Cefdinir     Throat swelling after 3rd dose  . Chantix [Varenicline] Other (See Comments)    "bad dreams, difficulty breathing"  . Doxycycline Hyclate Anaphylaxis and Rash    swelling  . Levofloxacin Anaphylaxis  . Penicillins Anaphylaxis, Shortness Of Breath and Rash    "ALMOST DIED, ended up in hospital"  PCN reaction causing immediate rash, facial/tongue/throat swelling, SOB or lightheadedness with hypotension: YES PCN reaction causing severe rash involving mucus membranes or skin necrosis: NO PCN reaction that required hospitalization YES Has patient had a PCN reaction occurring within the last 10  years: NO   . Sulfonamide Derivatives Shortness Of Breath, Swelling and Rash    "break out from head to toe"  . Clarithromycin Hives    swelling  . Nortriptyline Swelling, Rash and Other (See Comments)    Per patient had "kidney problems" on this medication, could not use bathroom (urinary retention)  . Codeine Nausea And Vomiting  . Fluticasone-Salmeterol Other (See Comments)    REACTION: ulcers in mouth  . Triptans Rash    Mouth breaks out and start itching    Objective: Blood pressure 140/78, pulse 94, temperature 98 F (36.7 C), temperature source Oral, weight 175 lb (79.4 kg), SpO2 96 % (stable when ambulating). Body mass index is 33.07 kg/m. Constitutional: Obese female in NAD HENT: Extensive nits in hair. TMs with some opacification but no erythema.  Cardiovascular: RRR, S1, S2, no m/r/g.  Pulmonary/Chest: Diffuse expiratory wheezes. No increased work of breathing. Neurological: AOx3, no focal deficits. CNII-CXII normal.  Skin: Skin is warm and dry. No rash noted.  Psychiatric: Normal mood and affect.  Vitals reviewed  Assessment/Plan: Cough - No increased sputum to suggest COPD exacerbation. Has had improvement with steroids for bronchitis flares in the past. Maintaining O2 sats at 96% even with ambulation. No focal lung findings or fever to suggest PNA.  - will give duoneb treatment in clinic and prescribe 50 mg prednisone x 5 days - Encouraged smoking cessation  Dizziness - Normotensive at today's visit. Most persistent symptom is room spinning sensation when turns head but  patient declines referral to vestibular PT for possible BPPV. With chronic sinus issues, likely with some Eustachian tube dysfunction. Currently has a cough and has history of post-tussive syncope.  - Encouraged patient to increase water intake - Asked patient to return if she continues to have lower blood pressures once bronchitis improves  Head lice - Could try combination product like OTC  "Rid." Provided rx for ivermectin if patient wanted to try. Strongly recommended having someone help her remove nits. Ultimately may need to shave her hair if infestation is at root.   Follow-up if no improvement in dizziness with increased fluid intake. Could consider decreasing topamax, though normotensive today.  Olene Floss, MD [place], PGY-3

## 2017-05-12 NOTE — Progress Notes (Deleted)
[facility] Progress Note  Subjective:  [name] is a 60 y.o.   No longer on botox   88/53 at home   Dizzy  No fevers  Lay on right side room spinning   Nauseated  headaches  Tried gum Down to 1/2 a pack   Coughing dry no fevers Albuterol every 3   Down to 84 at home   Lice still granddaughter - last week  Done     Chief Complaint  Patient presents with  . Cough  . Dizziness    Past Medical History:  Diagnosis Date  . Allergic rhinitis   . Arthritis    "hands and legs and feet" (04/27/2012)  . Cancer ([place]) 05   rt arm mole rem cancer  . Chronic bronchitis ([place])    "keep it all the time" (04/27/2012)  . COPD (chronic obstructive pulmonary disease) ([place])   . Depression   . Diverticulosis   . DJD (degenerative joint disease)   . Emphysema   . Epilepsy ([place])   . Esophageal stricture   . Exertional dyspnea   . GERD (gastroesophageal reflux disease)   . Glaucoma   . H/O blood clots    Right leg  . Hyperlipidemia   . Hypertension   . IBS (irritable bowel syndrome)   . Migraine   . Neck pain    Cervical disc  . OSA on CPAP    6-7 yrs; pt wears CPAP but has not been able to use it lately due to sinus issues  . Pneumonia 2012; 04/27/2012  . Right leg DVT ([place]) 1982   groin  . Seizures ([place])   . Type II diabetes mellitus ([place])    No meds since weight loss  . UTI (urinary tract infection)    2 weeks ago  . Vertigo 10/24/2014    Social History   Social History  . Marital status: Married    Spouse name: [name]  . Number of children: 2  . Years of education: 12   Occupational History  . disabled    Social History Main Topics  . Smoking status: Current Every Day Smoker    Packs/day: 1.00    Years: 40.00    Types: Cigarettes    Start date: 08/12/1975  . Smokeless tobacco: Former Systems developer    Quit date: 08/23/2012     Comment: started back smoking in 11/2012  . Alcohol use No  . Drug use: No  . Sexual activity: No   Other  Topics Concern  . Not on file   Social History Narrative   Patient lives at home with husband [name], her son and his wife.    Patient has 2 children.    Patient has a high school education.    Patient is currently unemployed.    Patient is right handed.    Patient drinks 2 cups of caffeine per day.       Allergies  Allergen Reactions  . Amoxicillin Anaphylaxis    Breakout, mouth swells  . Azithromycin Swelling and Rash    "swelling all over, high, thrush"  . Cefdinir     Throat swelling after 3rd dose  . Chantix [Varenicline] Other (See Comments)    "bad dreams, difficulty breathing"  . Doxycycline Hyclate Anaphylaxis and Rash    swelling  . Levofloxacin Anaphylaxis  . Penicillins Anaphylaxis, Shortness Of Breath and Rash    "ALMOST DIED, ended up in hospital"  PCN reaction causing immediate rash, facial/tongue/throat swelling,  SOB or lightheadedness with hypotension: YES PCN reaction causing severe rash involving mucus membranes or skin necrosis: NO PCN reaction that required hospitalization YES Has patient had a PCN reaction occurring within the last 10 years: NO   . Sulfonamide Derivatives Shortness Of Breath, Swelling and Rash    "break out from head to toe"  . Clarithromycin Hives    swelling  . Nortriptyline Swelling, Rash and Other (See Comments)    Per patient had "kidney problems" on this medication, could not use bathroom (urinary retention)  . Codeine Nausea And Vomiting  . Fluticasone-Salmeterol Other (See Comments)    REACTION: ulcers in mouth  . Triptans Rash    Mouth breaks out and start itching    Objective: Blood pressure 140/78, pulse 94, temperature 98 F (36.7 C), temperature source Oral, weight 175 lb (79.4 kg), SpO2 (!) 89 %.  Constitutional:  HENT:  Cardiovascular: RRR, S1, S2, no m/r/g.  Pulmonary/Chest: Effort normal and breath sounds normal. No respiratory distress.  Abdominal: Soft. +BS, soft, NT, ND, no rebound or guarding.   Musculoskeletal: ** Neurological: AOx3, no focal deficits. Skin: Skin is warm and dry. No rash noted. No erythema.  Psychiatric: Normal mood and affect.  Vitals reviewed  Assessment/Plan: No problem-specific Assessment & Plan notes found for this encounter.   Follow-up **.  Olene Floss, MD [place], PGY-2

## 2017-05-12 NOTE — Patient Instructions (Addendum)
Ms. Setterlund,  Please take prednisone for the next 5 days. Continue albuterol as needed with your daily inhalers.  For lice, you may ultimately need to shave your head if infection is at the hair root. The over the counter product "Rid" has one additional ingredient over the permethrin. You can also try ivermectin. Ask a family member to help comb out the nits.   Increase your water intake to 8-10 glasses a day. If no improvement in dizziness, see me back.  Best, Dr. Ola Spurr

## 2017-05-13 LAB — COMPREHENSIVE METABOLIC PANEL
ALBUMIN: 4 g/dL (ref 3.5–5.5)
ALT: 6 IU/L (ref 0–32)
AST: 16 IU/L (ref 0–40)
Albumin/Globulin Ratio: 1.4 (ref 1.2–2.2)
Alkaline Phosphatase: 143 IU/L — ABNORMAL HIGH (ref 39–117)
BUN / CREAT RATIO: 17 (ref 9–23)
BUN: 16 mg/dL (ref 6–24)
CO2: 21 mmol/L (ref 20–29)
CREATININE: 0.92 mg/dL (ref 0.57–1.00)
Calcium: 8.8 mg/dL (ref 8.7–10.2)
Chloride: 108 mmol/L — ABNORMAL HIGH (ref 96–106)
GFR calc non Af Amer: 68 mL/min/{1.73_m2} (ref >59)
GFR, EST AFRICAN AMERICAN: 79 mL/min/{1.73_m2} (ref >59)
GLOBULIN, TOTAL: 2.8 g/dL (ref 1.5–4.5)
Glucose: 95 mg/dL (ref 65–99)
Potassium: 4.4 mmol/L (ref 3.5–5.2)
SODIUM: 144 mmol/L (ref 134–144)
TOTAL PROTEIN: 6.8 g/dL (ref 6.0–8.5)

## 2017-05-13 LAB — CBC
HEMATOCRIT: 37.1 % (ref 34.0–46.6)
Hemoglobin: 12.2 g/dL (ref 11.1–15.9)
MCH: 31.6 pg (ref 26.6–33.0)
MCHC: 32.9 g/dL (ref 31.5–35.7)
MCV: 96 fL (ref 79–97)
PLATELETS: 223 10*3/uL (ref 150–379)
RBC: 3.86 x10E6/uL (ref 3.77–5.28)
RDW: 14.9 % (ref 12.3–15.4)
WBC: 11.1 10*3/uL — AB (ref 3.4–10.8)

## 2017-05-13 LAB — VITAMIN D 25 HYDROXY (VIT D DEFICIENCY, FRACTURES): VIT D 25 HYDROXY: 24.9 ng/mL — AB (ref 30.0–100.0)

## 2017-05-15 ENCOUNTER — Encounter: Payer: Self-pay | Admitting: Internal Medicine

## 2017-05-15 DIAGNOSIS — R531 Weakness: Secondary | ICD-10-CM | POA: Insufficient documentation

## 2017-05-15 DIAGNOSIS — R5383 Other fatigue: Secondary | ICD-10-CM | POA: Insufficient documentation

## 2017-05-15 DIAGNOSIS — B850 Pediculosis due to Pediculus humanus capitis: Secondary | ICD-10-CM | POA: Insufficient documentation

## 2017-05-15 NOTE — Assessment & Plan Note (Signed)
-   No increased sputum to suggest COPD exacerbation. Has had improvement with steroids for bronchitis flares in the past. Maintaining O2 sats at 96% even with ambulation. No focal lung findings or fever to suggest PNA.  - will give duoneb treatment in clinic and prescribe 50 mg prednisone x 5 days - Encouraged smoking cessation

## 2017-05-15 NOTE — Assessment & Plan Note (Signed)
-   Normotensive at today's visit. Most persistent symptom is room spinning sensation when turns head but patient declines referral to vestibular PT for possible BPPV. With chronic sinus issues, likely with some Eustachian tube dysfunction. Currently has a cough and has history of post-tussive syncope.  - Encouraged patient to increase water intake - Asked patient to return if she continues to have lower blood pressures once bronchitis improves

## 2017-05-15 NOTE — Assessment & Plan Note (Signed)
-   Could try combination product like OTC "Rid." Provided rx for ivermectin if patient wanted to try. Strongly recommended having someone help her remove nits. Ultimately may need to shave her hair if infestation is at root.

## 2017-05-19 ENCOUNTER — Ambulatory Visit: Payer: Medicaid Other | Admitting: Internal Medicine

## 2017-05-19 ENCOUNTER — Telehealth: Payer: Self-pay | Admitting: Internal Medicine

## 2017-05-19 MED ORDER — VITAMIN D (ERGOCALCIFEROL) 1.25 MG (50000 UNIT) PO CAPS: 50000.0000 [IU] | ORAL_CAPSULE | ORAL | 0 refills | Status: DC

## 2017-05-19 NOTE — Telephone Encounter (Signed)
Called patient to let her know vitamin D level is still in insufficient range despite her taking daily supplementation of 800 U. Will have her take 50,000 U weekly for 8 weeks to replete stores.   In addition, patient says she is feeling no better after steroid taper last week for suspected bronchitis flare. She is wondering if I can prescribe antibiotics over the phone. I asked patient to be seen at clinic to be assessed. She says she does not have the copay. Spoke with Faythe Dingwall who says copay could be waived on this occasion.  Olene Floss, MD [place], PGY-3

## 2017-05-29 ENCOUNTER — Other Ambulatory Visit: Payer: Self-pay | Admitting: Internal Medicine

## 2017-06-03 ENCOUNTER — Other Ambulatory Visit: Payer: Self-pay | Admitting: Nurse Practitioner

## 2017-06-03 ENCOUNTER — Other Ambulatory Visit: Payer: Self-pay | Admitting: Internal Medicine

## 2017-06-05 ENCOUNTER — Telehealth: Payer: Self-pay | Admitting: *Deleted

## 2017-06-05 NOTE — Telephone Encounter (Signed)
Received message on nurse line from [place] with [facility] [phone]) requesting clarification on Vit D. Has rx for 50,000 units and 400 units. Should patient be on one then the other, just one or both? L. Silvano Rusk, RN, BSN

## 2017-06-05 NOTE — Telephone Encounter (Signed)
Left message for pharmacy that patient will only need the 50,000 U prescription for the next 2 months. Left [place] number if any further questions.

## 2017-06-23 ENCOUNTER — Other Ambulatory Visit: Payer: Self-pay | Admitting: Internal Medicine

## 2017-06-29 ENCOUNTER — Other Ambulatory Visit: Payer: Self-pay | Admitting: Nurse Practitioner

## 2017-06-29 ENCOUNTER — Other Ambulatory Visit: Payer: Self-pay | Admitting: Internal Medicine

## 2017-07-27 ENCOUNTER — Other Ambulatory Visit: Payer: Self-pay | Admitting: Internal Medicine

## 2017-08-12 ENCOUNTER — Telehealth: Payer: Self-pay | Admitting: Internal Medicine

## 2017-08-12 NOTE — Telephone Encounter (Signed)
Pt needs refill on vitamin D and triptoline? I didn't see the last one in her chart, but she didn't know what else it was called. Please advise

## 2017-08-13 NOTE — Telephone Encounter (Signed)
[facility], as [facility]ertraline refill was sent to her mail order pharmacy on 07/27/17? I plan to change her vitamin D supplement to a lower dose daily supplement. Please let me know. Thank you.   Olene Floss, MD [place], PGY-3

## 2017-08-14 NOTE — Telephone Encounter (Signed)
Pt wants rx sent to the mail order pharmacy. Deseree Kennon Holter, CMA

## 2017-08-15 MED ORDER — SERTRALINE HCL 50 MG PO TABS
50.0000 mg | ORAL_TABLET | Freq: Every day | ORAL | 11 refills | Status: DC
Start: 2017-08-15 — End: 2018-07-23

## 2017-08-15 MED ORDER — VITAMIN D3 50 MCG (2000 UT) PO CAPS: 2000.0000 [IU] | ORAL_CAPSULE | Freq: Every day | ORAL | 11 refills | Status: DC

## 2017-08-15 NOTE — Telephone Encounter (Signed)
Sent refills of zoloft and vitamin D3 2000 IU to mail order pharmacy.  Olene Floss, MD [place], PGY-3

## 2017-08-19 ENCOUNTER — Ambulatory Visit: Payer: Medicaid Other | Admitting: Nurse Practitioner

## 2017-08-27 ENCOUNTER — Other Ambulatory Visit: Payer: Self-pay | Admitting: Internal Medicine

## 2017-08-27 ENCOUNTER — Other Ambulatory Visit: Payer: Self-pay | Admitting: Nurse Practitioner

## 2017-09-14 ENCOUNTER — Other Ambulatory Visit: Payer: Self-pay | Admitting: Nurse Practitioner

## 2017-09-15 ENCOUNTER — Other Ambulatory Visit: Payer: Self-pay

## 2017-09-15 MED ORDER — TOPIRAMATE 100 MG PO TABS: ORAL_TABLET | ORAL | 0 refills | Status: DC

## 2017-09-16 ENCOUNTER — Other Ambulatory Visit: Payer: Self-pay

## 2017-09-16 MED ORDER — TOPIRAMATE 100 MG PO TABS: ORAL_TABLET | ORAL | 1 refills | Status: DC

## 2017-09-28 ENCOUNTER — Other Ambulatory Visit: Payer: Self-pay | Admitting: Internal Medicine

## 2017-10-03 ENCOUNTER — Other Ambulatory Visit: Payer: Self-pay | Admitting: Internal Medicine

## 2017-10-20 ENCOUNTER — Ambulatory Visit (INDEPENDENT_AMBULATORY_CARE_PROVIDER_SITE_OTHER): Payer: Medicaid Other | Admitting: Internal Medicine

## 2017-10-20 VITALS — BP 110/60 | HR 76 | Temp 97.7°F | Wt 180.4 lb

## 2017-10-20 DIAGNOSIS — J441 Chronic obstructive pulmonary disease with (acute) exacerbation: Secondary | ICD-10-CM

## 2017-10-20 MED ORDER — PREDNISONE 50 MG PO TABS: ORAL_TABLET | ORAL | 0 refills | Status: DC

## 2017-10-20 MED ORDER — GUAIFENESIN ER 600 MG PO TB12: 1200.0000 mg | ORAL_TABLET | Freq: Two times a day (BID) | ORAL | 0 refills | Status: DC | PRN

## 2017-10-20 NOTE — Patient Instructions (Signed)
I want you to take Prednisone 50 mg for 5 days and Mucinex to help get up the sputum. Please follow up if no improvement after 1 week

## 2017-10-20 NOTE — Progress Notes (Signed)
   [facility] Kerrin Mo, MD Phone: [phone]  Reason For Visit: SDA for Cough   Cough  #Patient has had cough for about 1 month.  Patient with a history of COPD.  She states that she has had increased sputum production.  She has had some wheezing at times.  Denies any fevers or chills.  Denies any leg swelling.  Denies any postnasal drip. Denies any reflux symptoms.  Patient is highly allergic to most antibiotics.  She has been using her albuterol inhaler every 6 hours recently.  She has been taking her Dulera regularly.  She does indicate feeling short of breath at times  Past Medical History Reviewed problem list.  Medications- reviewed and updated No additions to family history Social history- patient is a non- smoker  Objective: BP 110/60 (BP Location: Right Arm, Patient Position: Sitting, Cuff Size: Normal)   Pulse 76   Temp 97.7 F (36.5 C) (Axillary)   Wt 180 lb 6.4 oz (81.8 kg)   SpO2 97%   BMI 34.09 kg/m  Gen: NAD, alert, cooperative with exam Cardio: regular rate and rhythm, S1S2 heard, no murmurs appreciated Pulm: clear to auscultation bilaterally, no wheezes, rhonchi or rales Skin: dry, intact, no rashes or lesions   Assessment/Plan: See problem based a/p  COPD exacerbation (HCC) Given patient has history of COPD and current symptoms likely COPD exacerbation.  Patient has multiple antibiotic allergies and is severely limited on the medications we can give her.  She states having anaphylaxis to penicillins, cephalosporins, sulfonamides, tetracycline's, floruquinolones, macrolide.  Given this history of high allergies will hold off of antibiotic use, will simply treat with prednisone for now. - predniSONE (DELTASONE) 50 MG tablet; Take 1 tablet daily with breakfast.  Dispense: 5 tablet; Refill: 0 - guaiFENesin (MUCINEX) 600 MG 12 hr tablet; Take 2 tablets (1,200 mg total) by mouth 2 (two) times daily as needed.  Dispense: 30 tablet; Refill:  0 - Patient to use albuterol inhaler every 4 hours.

## 2017-10-23 ENCOUNTER — Encounter: Payer: Self-pay | Admitting: Internal Medicine

## 2017-10-23 NOTE — Assessment & Plan Note (Signed)
Given patient has history of COPD and current symptoms likely COPD exacerbation.  Patient has multiple antibiotic allergies and is severely limited on the medications we can give her.  She states having anaphylaxis to penicillins, cephalosporins, sulfonamides, tetracycline's, floruquinolones, macrolide.  Given this history of high allergies will hold off of antibiotic use, will simply treat with prednisone for now. - predniSONE (DELTASONE) 50 MG tablet; Take 1 tablet daily with breakfast.  Dispense: 5 tablet; Refill: 0 - guaiFENesin (MUCINEX) 600 MG 12 hr tablet; Take 2 tablets (1,200 mg total) by mouth 2 (two) times daily as needed.  Dispense: 30 tablet; Refill: 0 - Patient to use albuterol inhaler every 4 hours.

## 2017-10-27 NOTE — Progress Notes (Deleted)
[facility]  PATIENT: KARIME SCHEUERMANN DOB: [birth year]   REASON FOR VISIT: Follow-up for seizure disorder and intractable migraines, depression HISTORY FROM: Patient    HISTORY OF PRESENT ILLNESS:UPDATE 5/9/ 2018CM Ms. [place], 62 year old female returns for follow-up with history of intractable migraines and a history of seizure disorder. She is on Topamax 100 mg 1 in the morning and 2 at night. She also gets Botox every 3 months. She continues to have mild headaches at times but the Botox has been very beneficial for her. She just had Botox last month. Last seizure occurred in September 2016. She is currently on Lamictal 200 twice a day. She denies side effects to her medication. She remains on Seroquel for depression and anxiety. She has some problems with left knee  giving out at times. She has seen an orthopedist in the knee replacement but she continues to put that off. She has had a replacement on the right knee previously. Blood pressure well controlled in the office today 132/80. She returns for reevaluation   UPDATE 11/01/2017CM: Ms. Mcclur[place], 62 year old female returns for follow-up. She has a history of intractable migraines and a history of seizure disorder. Last seizure occurred in September 2016. She is on Lamictal 200 mg twice a day and Topamax 100 mg 1 in the morning and 2 at night. She had sinus surgery two weeks ago and she's had more headaches. She's been taking oxycodone. She was referred for Botox with Dr. Ella Bodo and has been getting Botox every 3 months. She is due now. She also complains that she is having trouble urinating and is due to see her primary on Friday. Orthostatic hypotension noted today on exam standing pressure was 90/60. She denies any falls but she does complains of dizziness she returns for reevaluation. She remains on Seroquel.   4/3/17CMMs. [name] is a 62 year old right-handed white female with a history of intractable epilepsy, and intractable  headache. The patient is on lamotrigine and Topamax without full control of the seizures, the patient was sent for video EEG monitoring study at [facility], and the patient did not have any seizure episodes during the monitoring period. The patient has not had any seizures since the video EEG, this was done in September 2016.  The patient is not operating a motor vehicle. She still has frequent headaches, she may have 7 headache free days a month only. The headaches when they do come on last all day long. The patient has been tried on a multitude of medications including lamotrigine, Topamax, nortriptyline, Seroquel, hydrocodone, Advil, and Tylenol. The patient cannot take triptan medications. She may have some nausea, usually not vomiting with the headache. The headaches are impacting her ability to function during the day. She has an appointment to be evaluated for Botox tomorrow and she was encouraged to keep that appointment. She also had recent CT of the sinuses with  significant sinus disease and was referred to ENT. The patient returns to the office today for an evaluation.   REVIEW OF SYSTEMS: Full 14 system review of systems performed and notable only for those listed, all others are neg:  Constitutional: neg  Cardiovascular: neg Ear/Nose/Throat: neg  Skin: neg Eyes: Blurred vision  Respiratory: Cough chronic Gastroitestinal: neg Genitourinary neg Hematology/Lymphatic: neg  Endocrine: Excessive thirst Musculoskeletal: Back pain, neck pain Allergy/Immunology: neg Neurological: Headache, seizures, tremors, dizziness Psychiatric: Depression and anxiety Sleep : neg   ALLERGIES: Allergies  Allergen Reactions  . Amoxicillin Anaphylaxis    Breakout, mouth swells  .  Azithromycin Swelling and Rash    "swelling all over, high, thrush"  . Cefdinir     Throat swelling after 3rd dose  . Chantix [Varenicline] Other (See Comments)    "bad dreams, difficulty breathing"  . Doxycycline Hyclate  Anaphylaxis and Rash    swelling  . Levofloxacin Anaphylaxis  . Penicillins Anaphylaxis, Shortness Of Breath and Rash    "ALMOST DIED, ended up in hospital"  PCN reaction causing immediate rash, facial/tongue/throat swelling, SOB or lightheadedness with hypotension: YES PCN reaction causing severe rash involving mucus membranes or skin necrosis: NO PCN reaction that required hospitalization YES Has patient had a PCN reaction occurring within the last 10 years: NO   . Sulfonamide Derivatives Shortness Of Breath, Swelling and Rash    "break out from head to toe"  . Clarithromycin Hives    swelling  . Nortriptyline Swelling, Rash and Other (See Comments)    Per patient had "kidney problems" on this medication, could not use bathroom (urinary retention)  . Codeine Nausea And Vomiting  . Fluticasone-Salmeterol Other (See Comments)    REACTION: ulcers in mouth  . Triptans Rash    Mouth breaks out and start itching    HOME MEDICATIONS: Outpatient Medications Prior to Visit  Medication Sig Dispense Refill  . acetaminophen (TYLENOL) 500 MG tablet Take 1,000 mg by mouth every other day.    . albuterol (PROVENTIL) (2.5 MG/3ML) 0.083% nebulizer solution USE 1 VIAL IN NEBULIZER EVERY 6 HOURS AS NEEDED FOR SHORTNESS OF BREATH 75 mL 0  . ALLERGY 10 MG tablet TAKE 1 TABLET BY MOUTH EVERY DAY 30 tablet 0  . CALCITRATE 950 MG tablet TAKE 1 TABLET BY MOUTH TWO TIMES DAILY 60 tablet 11  . capsaicin (ZOSTRIX) 0.025 % cream Apply topically 2 (two) times daily. 60 g 0  . cefdinir (OMNICEF) 300 MG capsule Take 1 capsule (300 mg total) by mouth 2 (two) times daily. 20 capsule 0  . Cholecalciferol (VITAMIN D3) 2000 units capsule TAKE ONE CAPSULE BY MOUTH DAILY 30 capsule 0  . docusate sodium (COLACE) 100 MG capsule TAKE 1 CAPSULE BY MOUTH 2 TIMES DAILY AS NEEDED FOR MILD CONSTIPATION. 30 capsule 0  . DULERA 200-5 MCG/ACT AERO INHALE 2 PUFFS INTO THE LUNGS TWO TIMES DAILY 13 g 0  . ferrous sulfate 325 (65  FE) MG tablet Take 1 tablet (325 mg total) by mouth 2 (two) times daily with a meal. (Patient taking differently: Take 325 mg by mouth daily with breakfast. ) 60 tablet 3  . fluticasone (FLONASE) 50 MCG/ACT nasal spray USE 2 SPRAYS INTO EACH NOSTRIL DAILY AS NEEDED FOR CONGESTION. 16 g 0  . guaiFENesin (MUCINEX) 600 MG 12 hr tablet Take 2 tablets (1,200 mg total) by mouth 2 (two) times daily as needed. 30 tablet 0  . lamoTRIgine (LAMICTAL) 200 MG tablet TAKE ONE TABLET BY MOUTH 2 TIMES A DAY 180 tablet 0  . meloxicam (MOBIC) 15 MG tablet Take 1 tablet (15 mg total) by mouth daily. 90 tablet 1  . NON FORMULARY Use CPAP at bedtime    . omeprazole (PRILOSEC) 40 MG capsule TAKE ONE CAPSULE BY MOUTH 2 TIMES A DAY 60 capsule 0  . polyethylene glycol powder (GLYCOLAX/MIRALAX) powder MIX 17 GMS IN LIQUID AND DRINK ONCE DAILY 527 g 0  . predniSONE (DELTASONE) 50 MG tablet Take 1 tablet daily with breakfast. 5 tablet 0  . PROAIR HFA 108 (90 Base) MCG/ACT inhaler INHALE 2 PUFFS INTO THE LUNGS EVERY 6 HOURS AS  NEEDED FOR SHORTNESS OF BREATH 8.5 g 0  . QUEtiapine (SEROQUEL) 50 MG tablet TAKE ONE TABLET BY MOUTH AT BEDTIME 30 tablet 3  . sertraline (ZOLOFT) 50 MG tablet Take 1 tablet (50 mg total) by mouth daily. 30 tablet 11  . simvastatin (ZOCOR) 40 MG tablet TAKE 1 TABLET BY MOUTH AT BEDTIME 30 tablet 0  . topiramate (TOPAMAX) 100 MG tablet TAKE ONE TABLET BY MOUTH EVERY MORNING AND TAKE TWO TABLETS BY MOUTH AT BEDTIME 90 tablet 1  . traMADol (ULTRAM) 50 MG tablet Take 1 tablet (50 mg total) by mouth every 12 (twelve) hours as needed. 30 tablet 1  . VOLTAREN 1 % GEL APPLY 2 GRAMS TOPICALLY 2 TIMES A DAY TO AFFECTED AREA AS DIRECTED 100 g 0   No facility-administered medications prior to visit.     PAST MEDICAL HISTORY: Past Medical History:  Diagnosis Date  . Allergic rhinitis   . Arthritis    "hands and legs and feet" (04/27/2012)  . Cancer ([place]) 05   rt arm mole rem cancer  . Chronic bronchitis  ([place])    "keep it all the time" (04/27/2012)  . COPD (chronic obstructive pulmonary disease) ([place])   . Depression   . Diverticulosis   . DJD (degenerative joint disease)   . Emphysema   . Epilepsy ([place])   . Esophageal stricture   . Exertional dyspnea   . GERD (gastroesophageal reflux disease)   . Glaucoma   . H/O blood clots    Right leg  . Hyperlipidemia   . Hypertension   . IBS (irritable bowel syndrome)   . Migraine   . Neck pain    Cervical disc  . OSA on CPAP    6-7 yrs; pt wears CPAP but has not been able to use it lately due to sinus issues  . Pneumonia 2012; 04/27/2012  . Right leg DVT ([place]) 1982   groin  . Seizures ([place])   . Type II diabetes mellitus ([place])    No meds since weight loss  . UTI (urinary tract infection)    2 weeks ago  . Vertigo 10/24/2014    PAST SURGICAL HISTORY: Past Surgical History:  Procedure Laterality Date  . ABDOMINAL HYSTERECTOMY  2001   "partial"  . CARPAL TUNNEL RELEASE  ~ 2008   right  . CATARACT EXTRACTION Bilateral   . COLONOSCOPY WITH ESOPHAGOGASTRODUODENOSCOPY (EGD)    . KNEE ARTHROSCOPY  1990's   left  . NASAL SEPTOPLASTY W/ TURBINOPLASTY Bilateral 05/23/2016   Procedure: NASAL SEPTOPLASTY WITH TURBINATE REDUCTION;  Surgeon: Jerrell Belfast, MD;  Location: [facility];  Service: ENT;  Laterality: Bilateral;  . SHOULDER OPEN ROTATOR CUFF REPAIR  ~ 2010   left  . SINUS ENDO WITH FUSION Right 05/23/2016   Procedure: LIMITED RIGHT ENDOSCOPIC SINUS SURGERY;  Surgeon: Jerrell Belfast, MD;  Location: [facility];  Service: ENT;  Laterality: Right;  . TOTAL KNEE ARTHROPLASTY  08/23/2012   right  . TOTAL KNEE ARTHROPLASTY  08/23/2012   Procedure: TOTAL KNEE ARTHROPLASTY;  Surgeon: Lorn Junes, MD;  Location: [facility];  Service: Orthopedics;  Laterality: Right;  RIGHT KNEE ARTHROPLASTY MEDIAL AND LATERAL COMPARTMENTS WITH PATELLA RESURFACING  . TUBAL LIGATION  2001    FAMILY HISTORY: Family History  Problem Relation Age of Onset  . Alcohol  abuse Father   . Lung cancer Father        was a smoker  . Emphysema Father        was  a smoker  . Heart disease Sister   . Lung cancer Mother        was a smoker  . Stroke Mother   . Other Sister        blood clotting disorder  . Seizures Brother   . Heart disease Maternal Grandfather   . Diabetes Maternal Grandfather   . Heart disease Unknown        uncle  . Diabetes Unknown        uncles/aunts  . Breast cancer Maternal Aunt   . Bipolar disorder Son   . Muscular dystrophy Son   . Heart disease Son   . Colon cancer Neg Hx     SOCIAL HISTORY: Social History   Socioeconomic History  . Marital status: Married    Spouse name: [name]  . Number of children: 2  . Years of education: 29  . Highest education level: Not on file  Social Needs  . Financial resource strain: Not on file  . Food insecurity - worry: Not on file  . Food insecurity - inability: Not on file  . Transportation needs - medical: Not on file  . Transportation needs - non-medical: Not on file  Occupational History  . Occupation: disabled  Tobacco Use  . Smoking status: Current Every Day Smoker    Packs/day: 1.00    Years: 40.00    Pack years: 40.00    Types: Cigarettes    Start date: 08/12/1975  . Smokeless tobacco: Former Systems developer    Quit date: 08/23/2012  . Tobacco comment: started back smoking in 11/2012  Substance and Sexual Activity  . Alcohol use: No    Alcohol/week: 0.0 oz  . Drug use: No  . Sexual activity: No  Other Topics Concern  . Not on file  Social History Narrative   Patient lives at home with husband [name], her son and his wife.    Patient has 2 children.    Patient has a high school education.    Patient is currently unemployed.    Patient is right handed.    Patient drinks 2 cups of caffeine per day.     PHYSICAL EXAM  Vitals:   06/11/16 1528  BP: 132/80  Pulse: 81  Weight: 174 lb (78.9 kg)  Height: 5\' 1"  (1.549 m)   There is no height or weight on file to calculate  BMI.  Generalized: Well developed, obese female in no acute distress  Head: normocephalic and atraumatic,. Oropharynx benign  Neck: Supple, no carotid bruits  Cardiac: Regular rate rhythm, no murmur  Musculoskeletal: No deformity   Neurological examination   Mentation: Alert oriented to time, place, history taking. Attention span and concentration appropriate. Recent and remote memory intact.  Follows all commands speech and language fluent.   Cranial nerve II-XII: Pupils were equal round reactive to light extraocular movements were full, visual field were full on confrontational test. Facial sensation and strength were normal. hearing was intact to finger rubbing bilaterally. Uvula tongue midline. head turning and shoulder shrug were normal and symmetric.Tongue protrusion into cheek strength was normal. Motor: normal bulk and tone, full strength in the BUE, BLE,  Sensory: normal and symmetric to light touch, pinprick, and  Vibration in the upper and lower extremities,  Coordination: finger-nose-finger, heel-to-shin bilaterally, no dysmetria Reflexes: Symmetric upper and lower, plantar responses were flexor bilaterally. Gait and Station: Rising up from seated position without assistance, wide base unsteady gait , tandem gait not attempted Romberg is negative . No  assistive device  DIAGNOSTIC DATA (LABS, IMAGING, TESTING) - I reviewed patient records, labs, notes, testing and imaging myself where available.  Lab Results  Component Value Date   WBC 11.1 (H) 05/12/2017   HGB 12.2 05/12/2017   HCT 37.1 05/12/2017   MCV 96 05/12/2017   PLT 223 05/12/2017      Component Value Date/Time   NA 144 05/12/2017 1545   K 4.4 05/12/2017 1545   CL 108 (H) 05/12/2017 1545   CO2 21 05/12/2017 1545   GLUCOSE 95 05/12/2017 1545   GLUCOSE 82 06/13/2016 1441   BUN 16 05/12/2017 1545   CREATININE 0.92 05/12/2017 1545   CREATININE 0.91 06/13/2016 1441   CALCIUM 8.8 05/12/2017 1545   PROT 6.8  05/12/2017 1545   ALBUMIN 4.0 05/12/2017 1545   AST 16 05/12/2017 1545   ALT 6 05/12/2017 1545   ALKPHOS 143 (H) 05/12/2017 1545   BILITOT <0.2 05/12/2017 1545   GFRNONAA 68 05/12/2017 1545   GFRNONAA 70 06/13/2016 1441   GFRAA 79 05/12/2017 1545   GFRAA 80 06/13/2016 1441       ASSESSMENT AND PLAN  62 y.o. year old female  has a past medical history of Intractable epilepsy and intractable migraine headaches. She has not had further seizure activity since monitoring at [facility] in September 2016. She is currently on Topamax and Lamictal. She has been receiving Botox every 3 months with Dr. Ella Bodo. Marland Kitchen   PLAN: Continue Lamictal 200mg  twice daily  Continue Topamax  100mg  in the am and 200mg  in the pm  Continue Seroquel at current dose will refill Continue Botox for migraines Stay well hydrated F/U in 8 months I spent 20 min in total face to face time with the patient more than 50% of which was spent counseling and coordination of care, reviewing test results reviewing medications and discussing and reviewing the diagnosis of intractable migraine and seizure disorder and further treatment options if necessary. , Rayburn Ma, [facility], APRN  [facility] [address] [place], [place] [address] [phone]

## 2017-10-28 ENCOUNTER — Other Ambulatory Visit: Payer: Self-pay | Admitting: Internal Medicine

## 2017-11-05 ENCOUNTER — Ambulatory Visit: Payer: Medicaid Other | Admitting: Nurse Practitioner

## 2017-11-30 ENCOUNTER — Other Ambulatory Visit: Payer: Self-pay | Admitting: Family Medicine

## 2017-11-30 ENCOUNTER — Other Ambulatory Visit: Payer: Self-pay | Admitting: Nurse Practitioner

## 2017-11-30 ENCOUNTER — Other Ambulatory Visit: Payer: Self-pay | Admitting: Neurology

## 2017-12-01 ENCOUNTER — Ambulatory Visit: Payer: Medicaid Other | Admitting: Nurse Practitioner

## 2017-12-29 ENCOUNTER — Other Ambulatory Visit: Payer: Self-pay | Admitting: Internal Medicine

## 2017-12-29 ENCOUNTER — Other Ambulatory Visit: Payer: Self-pay | Admitting: Nurse Practitioner

## 2017-12-29 DIAGNOSIS — H401134 Primary open-angle glaucoma, bilateral, indeterminate stage: Secondary | ICD-10-CM | POA: Diagnosis not present

## 2017-12-29 LAB — HM DIABETES EYE EXAM

## 2018-01-27 ENCOUNTER — Encounter: Payer: Self-pay | Admitting: Internal Medicine

## 2018-02-01 ENCOUNTER — Other Ambulatory Visit: Payer: Self-pay | Admitting: Internal Medicine

## 2018-02-16 ENCOUNTER — Other Ambulatory Visit: Payer: Self-pay | Admitting: Nurse Practitioner

## 2018-02-19 ENCOUNTER — Other Ambulatory Visit: Payer: Self-pay | Admitting: Internal Medicine

## 2018-03-02 ENCOUNTER — Other Ambulatory Visit: Payer: Self-pay | Admitting: Internal Medicine

## 2018-03-08 NOTE — Progress Notes (Deleted)
[facility]  PATIENT: KARIME SCHEUERMANN DOB: [birth year]   REASON FOR VISIT: Follow-up for seizure disorder and intractable migraines, depression HISTORY FROM: Patient    HISTORY OF PRESENT ILLNESS:UPDATE 5/9/ 2018CM Ms. [place], 62 year old female returns for follow-up with history of intractable migraines and a history of seizure disorder. She is on Topamax 100 mg 1 in the morning and 2 at night. She also gets Botox every 3 months. She continues to have mild headaches at times but the Botox has been very beneficial for her. She just had Botox last month. Last seizure occurred in September 2016. She is currently on Lamictal 200 twice a day. She denies side effects to her medication. She remains on Seroquel for depression and anxiety. She has some problems with left knee  giving out at times. She has seen an orthopedist in the knee replacement but she continues to put that off. She has had a replacement on the right knee previously. Blood pressure well controlled in the office today 132/80. She returns for reevaluation   UPDATE 11/01/2017CM: Ms. Mcclur[place], 62 year old female returns for follow-up. She has a history of intractable migraines and a history of seizure disorder. Last seizure occurred in September 2016. She is on Lamictal 200 mg twice a day and Topamax 100 mg 1 in the morning and 2 at night. She had sinus surgery two weeks ago and she's had more headaches. She's been taking oxycodone. She was referred for Botox with Dr. Ella Bodo and has been getting Botox every 3 months. She is due now. She also complains that she is having trouble urinating and is due to see her primary on Friday. Orthostatic hypotension noted today on exam standing pressure was 90/60. She denies any falls but she does complains of dizziness she returns for reevaluation. She remains on Seroquel.   4/3/17CMMs. [name] is a 62 year old right-handed white female with a history of intractable epilepsy, and intractable  headache. The patient is on lamotrigine and Topamax without full control of the seizures, the patient was sent for video EEG monitoring study at [facility], and the patient did not have any seizure episodes during the monitoring period. The patient has not had any seizures since the video EEG, this was done in September 2016.  The patient is not operating a motor vehicle. She still has frequent headaches, she may have 7 headache free days a month only. The headaches when they do come on last all day long. The patient has been tried on a multitude of medications including lamotrigine, Topamax, nortriptyline, Seroquel, hydrocodone, Advil, and Tylenol. The patient cannot take triptan medications. She may have some nausea, usually not vomiting with the headache. The headaches are impacting her ability to function during the day. She has an appointment to be evaluated for Botox tomorrow and she was encouraged to keep that appointment. She also had recent CT of the sinuses with  significant sinus disease and was referred to ENT. The patient returns to the office today for an evaluation.   REVIEW OF SYSTEMS: Full 14 system review of systems performed and notable only for those listed, all others are neg:  Constitutional: neg  Cardiovascular: neg Ear/Nose/Throat: neg  Skin: neg Eyes: Blurred vision  Respiratory: Cough chronic Gastroitestinal: neg Genitourinary neg Hematology/Lymphatic: neg  Endocrine: Excessive thirst Musculoskeletal: Back pain, neck pain Allergy/Immunology: neg Neurological: Headache, seizures, tremors, dizziness Psychiatric: Depression and anxiety Sleep : neg   ALLERGIES: Allergies  Allergen Reactions  . Amoxicillin Anaphylaxis    Breakout, mouth swells  .  Azithromycin Swelling and Rash    "swelling all over, high, thrush"  . Cefdinir     Throat swelling after 3rd dose  . Chantix [Varenicline] Other (See Comments)    "bad dreams, difficulty breathing"  . Doxycycline Hyclate  Anaphylaxis and Rash    swelling  . Levofloxacin Anaphylaxis  . Penicillins Anaphylaxis, Shortness Of Breath and Rash    "ALMOST DIED, ended up in hospital"  PCN reaction causing immediate rash, facial/tongue/throat swelling, SOB or lightheadedness with hypotension: YES PCN reaction causing severe rash involving mucus membranes or skin necrosis: NO PCN reaction that required hospitalization YES Has patient had a PCN reaction occurring within the last 10 years: NO   . Sulfonamide Derivatives Shortness Of Breath, Swelling and Rash    "break out from head to toe"  . Clarithromycin Hives    swelling  . Nortriptyline Swelling, Rash and Other (See Comments)    Per patient had "kidney problems" on this medication, could not use bathroom (urinary retention)  . Codeine Nausea And Vomiting  . Fluticasone-Salmeterol Other (See Comments)    REACTION: ulcers in mouth  . Triptans Rash    Mouth breaks out and start itching    HOME MEDICATIONS: Outpatient Medications Prior to Visit  Medication Sig Dispense Refill  . acetaminophen (TYLENOL) 500 MG tablet Take 1,000 mg by mouth every other day.    . albuterol (PROVENTIL) (2.5 MG/3ML) 0.083% nebulizer solution USE 1 VIAL IN NEBULIZER EVERY 6 HOURS AS NEEDED FOR SHORTNESS OF BREATH 75 mL 0  . CALCITRATE 950 MG tablet TAKE 1 TABLET BY MOUTH TWO TIMES DAILY 60 tablet 11  . capsaicin (ZOSTRIX) 0.025 % cream Apply topically 2 (two) times daily. 60 g 0  . cefdinir (OMNICEF) 300 MG capsule Take 1 capsule (300 mg total) by mouth 2 (two) times daily. 20 capsule 0  . Cholecalciferol (VITAMIN D3) 2000 units capsule TAKE ONE CAPSULE BY MOUTH DAILY 30 capsule 0  . docusate sodium (COLACE) 100 MG capsule TAKE 1 CAPSULE BY MOUTH 2 TIMES DAILY AS NEEDED FOR MILD CONSTIPATION. 30 capsule 0  . DULERA 200-5 MCG/ACT AERO INHALE 2 PUFFS INTO THE LUNGS TWO TIMES DAILY 13 g 0  . ferrous sulfate 325 (65 FE) MG tablet Take 1 tablet (325 mg total) by mouth 2 (two) times daily  with a meal. (Patient taking differently: Take 325 mg by mouth daily with breakfast. ) 60 tablet 3  . fluticasone (FLONASE) 50 MCG/ACT nasal spray SPRAY 2 SPRAYS INTO EACH NOSTRIL DAILY AS NEEDED FOR CONGESTION. 16 g 0  . guaiFENesin (MUCINEX) 600 MG 12 hr tablet Take 2 tablets (1,200 mg total) by mouth 2 (two) times daily as needed. 30 tablet 0  . lamoTRIgine (LAMICTAL) 200 MG tablet TAKE ONE TABLET BY MOUTH 2 TIMES A DAY - PLEASE KEEP APPOINTMENT ON 03/09/18. 180 tablet 0  . loratadine (CLARITIN) 10 MG tablet TAKE 1 TABLET BY MOUTH EVERY DAY 30 tablet 0  . meloxicam (MOBIC) 15 MG tablet Take 1 tablet (15 mg total) by mouth daily. 90 tablet 1  . NON FORMULARY Use CPAP at bedtime    . omeprazole (PRILOSEC) 40 MG capsule TAKE ONE CAPSULE BY MOUTH 2 TIMES A DAY 60 capsule 0  . polyethylene glycol powder (GLYCOLAX/MIRALAX) powder MIX 17 GMS IN LIQUID AND DRINK ONCE DAILY 527 g 0  . predniSONE (DELTASONE) 50 MG tablet Take 1 tablet daily with breakfast. 5 tablet 0  . PROAIR HFA 108 (90 Base) MCG/ACT inhaler INHALE 2 PUFFS  INTO THE LUNGS EVERY 6 HOURS AS NEEDED FOR SHORTNESS OF BREATH 8.5 g 0  . QUEtiapine (SEROQUEL) 50 MG tablet TAKE ONE TABLET BY MOUTH AT BEDTIME 30 tablet 2  . sertraline (ZOLOFT) 50 MG tablet Take 1 tablet (50 mg total) by mouth daily. 30 tablet 11  . simvastatin (ZOCOR) 40 MG tablet TAKE 1 TABLET BY MOUTH AT BEDTIME 30 tablet 0  . topiramate (TOPAMAX) 100 MG tablet TAKE ONE TABLET BY MOUTH EVERY MORNING AND 2 TABLETS AT BEDTIME 90 tablet 2  . traMADol (ULTRAM) 50 MG tablet Take 1 tablet (50 mg total) by mouth every 12 (twelve) hours as needed. 30 tablet 1  . VOLTAREN 1 % GEL APPLY 2 GRAMS TOPICALLY 2 TIMES A DAY TO AFFECTED AREA AS DIRECTED 100 g 0   No facility-administered medications prior to visit.     PAST MEDICAL HISTORY: Past Medical History:  Diagnosis Date  . Allergic rhinitis   . Arthritis    "hands and legs and feet" (04/27/2012)  . Cancer ([place]) 05   rt arm mole  rem cancer  . Chronic bronchitis ([place])    "keep it all the time" (04/27/2012)  . COPD (chronic obstructive pulmonary disease) ([place])   . Depression   . Diverticulosis   . DJD (degenerative joint disease)   . Emphysema   . Epilepsy ([place])   . Esophageal stricture   . Exertional dyspnea   . GERD (gastroesophageal reflux disease)   . Glaucoma   . H/O blood clots    Right leg  . Hyperlipidemia   . Hypertension   . IBS (irritable bowel syndrome)   . Migraine   . Neck pain    Cervical disc  . OSA on CPAP    6-7 yrs; pt wears CPAP but has not been able to use it lately due to sinus issues  . Pneumonia 2012; 04/27/2012  . Right leg DVT ([place]) 1982   groin  . Seizures ([place])   . Type II diabetes mellitus ([place])    No meds since weight loss  . UTI (urinary tract infection)    2 weeks ago  . Vertigo 10/24/2014    PAST SURGICAL HISTORY: Past Surgical History:  Procedure Laterality Date  . ABDOMINAL HYSTERECTOMY  2001   "partial"  . CARPAL TUNNEL RELEASE  ~ 2008   right  . CATARACT EXTRACTION Bilateral   . COLONOSCOPY WITH ESOPHAGOGASTRODUODENOSCOPY (EGD)    . KNEE ARTHROSCOPY  1990's   left  . NASAL SEPTOPLASTY W/ TURBINOPLASTY Bilateral 05/23/2016   Procedure: NASAL SEPTOPLASTY WITH TURBINATE REDUCTION;  Surgeon: Jerrell Belfast, MD;  Location: [facility];  Service: ENT;  Laterality: Bilateral;  . SHOULDER OPEN ROTATOR CUFF REPAIR  ~ 2010   left  . SINUS ENDO WITH FUSION Right 05/23/2016   Procedure: LIMITED RIGHT ENDOSCOPIC SINUS SURGERY;  Surgeon: Jerrell Belfast, MD;  Location: [facility];  Service: ENT;  Laterality: Right;  . TOTAL KNEE ARTHROPLASTY  08/23/2012   right  . TOTAL KNEE ARTHROPLASTY  08/23/2012   Procedure: TOTAL KNEE ARTHROPLASTY;  Surgeon: Lorn Junes, MD;  Location: [facility];  Service: Orthopedics;  Laterality: Right;  RIGHT KNEE ARTHROPLASTY MEDIAL AND LATERAL COMPARTMENTS WITH PATELLA RESURFACING  . TUBAL LIGATION  2001    FAMILY HISTORY: Family History  Problem  Relation Age of Onset  . Alcohol abuse Father   . Lung cancer Father        was a smoker  . Emphysema Father  was a smoker  . Heart disease Sister   . Lung cancer Mother        was a smoker  . Stroke Mother   . Other Sister        blood clotting disorder  . Seizures Brother   . Heart disease Maternal Grandfather   . Diabetes Maternal Grandfather   . Heart disease Unknown        uncle  . Diabetes Unknown        uncles/aunts  . Breast cancer Maternal Aunt   . Bipolar disorder Son   . Muscular dystrophy Son   . Heart disease Son   . Colon cancer Neg Hx     SOCIAL HISTORY: Social History   Socioeconomic History  . Marital status: Married    Spouse name: [name]  . Number of children: 2  . Years of education: 53  . Highest education level: Not on file  Occupational History  . Occupation: disabled  Social Needs  . Financial resource strain: Not on file  . Food insecurity:    Worry: Not on file    Inability: Not on file  . Transportation needs:    Medical: Not on file    Non-medical: Not on file  Tobacco Use  . Smoking status: Current Every Day Smoker    Packs/day: 1.00    Years: 40.00    Pack years: 40.00    Types: Cigarettes    Start date: 08/12/1975  . Smokeless tobacco: Former Systems developer    Quit date: 08/23/2012  . Tobacco comment: started back smoking in 11/2012  Substance and Sexual Activity  . Alcohol use: No    Alcohol/week: 0.0 oz  . Drug use: No  . Sexual activity: Never  Lifestyle  . Physical activity:    Days per week: Not on file    Minutes per session: Not on file  . Stress: Not on file  Relationships  . Social connections:    Talks on phone: Not on file    Gets together: Not on file    Attends religious service: Not on file    Active member of club or organization: Not on file    Attends meetings of clubs or organizations: Not on file    Relationship status: Not on file  . Intimate partner violence:    Fear of current or ex partner: Not on  file    Emotionally abused: Not on file    Physically abused: Not on file    Forced sexual activity: Not on file  Other Topics Concern  . Not on file  Social History Narrative   Patient lives at home with husband [name], her son and his wife.    Patient has 2 children.    Patient has a high school education.    Patient is currently unemployed.    Patient is right handed.    Patient drinks 2 cups of caffeine per day.     PHYSICAL EXAM  Vitals:   06/11/16 1528  BP: 132/80  Pulse: 81  Weight: 174 lb (78.9 kg)  Height: 5\' 1"  (1.549 m)   There is no height or weight on file to calculate BMI.  Generalized: Well developed, obese female in no acute distress  Head: normocephalic and atraumatic,. Oropharynx benign  Neck: Supple, no carotid bruits  Cardiac: Regular rate rhythm, no murmur  Musculoskeletal: No deformity   Neurological examination   Mentation: Alert oriented to time, place, history taking. Attention span and concentration appropriate.  Recent and remote memory intact.  Follows all commands speech and language fluent.   Cranial nerve II-XII: Pupils were equal round reactive to light extraocular movements were full, visual field were full on confrontational test. Facial sensation and strength were normal. hearing was intact to finger rubbing bilaterally. Uvula tongue midline. head turning and shoulder shrug were normal and symmetric.Tongue protrusion into cheek strength was normal. Motor: normal bulk and tone, full strength in the BUE, BLE,  Sensory: normal and symmetric to light touch, pinprick, and  Vibration in the upper and lower extremities,  Coordination: finger-nose-finger, heel-to-shin bilaterally, no dysmetria Reflexes: Symmetric upper and lower, plantar responses were flexor bilaterally. Gait and Station: Rising up from seated position without assistance, wide base unsteady gait , tandem gait not attempted Romberg is negative . No assistive device  DIAGNOSTIC DATA  (LABS, IMAGING, TESTING) - I reviewed patient records, labs, notes, testing and imaging myself where available.  Lab Results  Component Value Date   WBC 11.1 (H) 05/12/2017   HGB 12.2 05/12/2017   HCT 37.1 05/12/2017   MCV 96 05/12/2017   PLT 223 05/12/2017      Component Value Date/Time   NA 144 05/12/2017 1545   K 4.4 05/12/2017 1545   CL 108 (H) 05/12/2017 1545   CO2 21 05/12/2017 1545   GLUCOSE 95 05/12/2017 1545   GLUCOSE 82 06/13/2016 1441   BUN 16 05/12/2017 1545   CREATININE 0.92 05/12/2017 1545   CREATININE 0.91 06/13/2016 1441   CALCIUM 8.8 05/12/2017 1545   PROT 6.8 05/12/2017 1545   ALBUMIN 4.0 05/12/2017 1545   AST 16 05/12/2017 1545   ALT 6 05/12/2017 1545   ALKPHOS 143 (H) 05/12/2017 1545   BILITOT <0.2 05/12/2017 1545   GFRNONAA 68 05/12/2017 1545   GFRNONAA 70 06/13/2016 1441   GFRAA 79 05/12/2017 1545   GFRAA 80 06/13/2016 1441       ASSESSMENT AND PLAN  62 y.o. year old female  has a past medical history of Intractable epilepsy and intractable migraine headaches. She has not had further seizure activity since monitoring at [facility] in September 2016. She is currently on Topamax and Lamictal. She has been receiving Botox every 3 months with Dr. Ella Bodo. Marland Kitchen   PLAN: Continue Lamictal 200mg  twice daily  Continue Topamax  100mg  in the am and 200mg  in the pm  Continue Seroquel at current dose will refill Continue Botox for migraines Stay well hydrated F/U in 8 months I spent 20 min in total face to face time with the patient more than 50% of which was spent counseling and coordination of care, reviewing test results reviewing medications and discussing and reviewing the diagnosis of intractable migraine and seizure disorder and further treatment options if necessary. , Rayburn Ma, [facility], APRN  [facility] [address] [place],  [address] [phone]

## 2018-03-09 ENCOUNTER — Ambulatory Visit: Payer: Medicaid Other | Admitting: Nurse Practitioner

## 2018-03-19 ENCOUNTER — Other Ambulatory Visit: Payer: Self-pay

## 2018-03-19 MED ORDER — ALBUTEROL SULFATE (2.5 MG/3ML) 0.083% IN NEBU: INHALATION_SOLUTION | RESPIRATORY_TRACT | 3 refills | Status: DC

## 2018-03-19 MED ORDER — ALBUTEROL SULFATE HFA 108 (90 BASE) MCG/ACT IN AERS
INHALATION_SPRAY | RESPIRATORY_TRACT | 3 refills | Status: DC
Start: 2018-03-19 — End: 2018-07-23

## 2018-03-22 ENCOUNTER — Ambulatory Visit: Payer: Medicaid Other | Admitting: Family Medicine

## 2018-03-30 ENCOUNTER — Other Ambulatory Visit: Payer: Self-pay | Admitting: Nurse Practitioner

## 2018-03-30 ENCOUNTER — Other Ambulatory Visit: Payer: Self-pay | Admitting: Family Medicine

## 2018-03-30 ENCOUNTER — Other Ambulatory Visit: Payer: Self-pay | Admitting: Internal Medicine

## 2018-03-30 NOTE — Telephone Encounter (Signed)
I called pt. Advised she needed to come in for an earlier f/u since she has not been seen in over one year. I see where she has appt scheduled for 08/2018 but she needs sooner appt. I scheduled appt with Dr. [name] on 04/02/18 at 12pm. She verbalized understanding.

## 2018-04-02 ENCOUNTER — Ambulatory Visit: Payer: Medicaid Other | Admitting: Neurology

## 2018-04-02 ENCOUNTER — Encounter: Payer: Self-pay | Admitting: Neurology

## 2018-04-02 VITALS — BP 137/79 | HR 106 | Ht 61.0 in | Wt 187.0 lb

## 2018-04-02 DIAGNOSIS — G43019 Migraine without aura, intractable, without status migrainosus: Secondary | ICD-10-CM

## 2018-04-02 DIAGNOSIS — Z5181 Encounter for therapeutic drug level monitoring: Secondary | ICD-10-CM

## 2018-04-02 DIAGNOSIS — H8110 Benign paroxysmal vertigo, unspecified ear: Secondary | ICD-10-CM | POA: Diagnosis not present

## 2018-04-02 MED ORDER — FREMANEZUMAB-VFRM 225 MG/1.5ML ~~LOC~~ SOSY: 225.0000 mg | PREFILLED_SYRINGE | SUBCUTANEOUS | 4 refills | Status: DC

## 2018-04-02 MED ORDER — TOPIRAMATE 100 MG PO TABS: ORAL_TABLET | ORAL | 3 refills | Status: DC

## 2018-04-02 MED ORDER — QUETIAPINE FUMARATE 100 MG PO TABS: 100.0000 mg | ORAL_TABLET | Freq: Every day | ORAL | 1 refills | Status: DC

## 2018-04-02 NOTE — Patient Instructions (Signed)
We will go up on the Seroquel for the headache and for sleep. We will start Ajovy for the headache.  We will get a physical therapy evaluation for the vertigo.

## 2018-04-02 NOTE — Progress Notes (Signed)
Reason for visit: Seizures, headache  [name] is an 62 y.o. female  History of present illness:  [name] is a 62 year old right-handed white female with a history of intractable migraine headache and a history of seizures.  The patient comes into the office today with several new complaints.  She had been getting Botox injections through the rehab department, but she is no longer doing this as she apparently missed 3 appointments.  The patient indicates that she is having about 3-4 headaches a week.  Over the last 2 months she has not been sleeping well, she will go to sleep at her usual time but then wake up around 2 or 3 in the morning and cannot get back to sleep.  She has had several events of staring off, one lasting up to 10 minutes.  She can hear what people are saying during the events, but she cannot respond.  The patient has not been missing any doses of her Topamax or Lamictal.  The patient also within the last month has developed positional vertigo.  She generally feels well when she is up and doing things, when she lies down and rolls on her right side the vertigo comes on.  The patient does not get nausea or vomiting with this.  When she gets to sleep it does not wake her up.  The patient returns to this office for an evaluation.  Past Medical History:  Diagnosis Date  . Allergic rhinitis   . Arthritis    "hands and legs and feet" (04/27/2012)  . Cancer ([place]) 05   rt arm mole rem cancer  . Chronic bronchitis ([place])    "keep it all the time" (04/27/2012)  . COPD (chronic obstructive pulmonary disease) ([place])   . Depression   . Diverticulosis   . DJD (degenerative joint disease)   . Emphysema   . Epilepsy ([place])   . Esophageal stricture   . Exertional dyspnea   . GERD (gastroesophageal reflux disease)   . Glaucoma   . H/O blood clots    Right leg  . Hyperlipidemia   . Hypertension   . IBS (irritable bowel syndrome)   . Migraine   . Neck pain    Cervical disc  . OSA  on CPAP    6-7 yrs; pt wears CPAP but has not been able to use it lately due to sinus issues  . Pneumonia 2012; 04/27/2012  . Right leg DVT ([place]) 1982   groin  . Seizures ([place])   . Type II diabetes mellitus ([place])    No meds since weight loss  . UTI (urinary tract infection)    2 weeks ago  . Vertigo 10/24/2014    Past Surgical History:  Procedure Laterality Date  . ABDOMINAL HYSTERECTOMY  2001   "partial"  . CARPAL TUNNEL RELEASE  ~ 2008   right  . CATARACT EXTRACTION Bilateral   . COLONOSCOPY WITH ESOPHAGOGASTRODUODENOSCOPY (EGD)    . KNEE ARTHROSCOPY  1990's   left  . NASAL SEPTOPLASTY W/ TURBINOPLASTY Bilateral 05/23/2016   Procedure: NASAL SEPTOPLASTY WITH TURBINATE REDUCTION;  Surgeon: Jerrell Belfast, MD;  Location: [facility];  Service: ENT;  Laterality: Bilateral;  . SHOULDER OPEN ROTATOR CUFF REPAIR  ~ 2010   left  . SINUS ENDO WITH FUSION Right 05/23/2016   Procedure: LIMITED RIGHT ENDOSCOPIC SINUS SURGERY;  Surgeon: Jerrell Belfast, MD;  Location: [facility];  Service: ENT;  Laterality: Right;  . TOTAL KNEE ARTHROPLASTY  08/23/2012  right  . TOTAL KNEE ARTHROPLASTY  08/23/2012   Procedure: TOTAL KNEE ARTHROPLASTY;  Surgeon: Lorn Junes, MD;  Location: [facility];  Service: Orthopedics;  Laterality: Right;  RIGHT KNEE ARTHROPLASTY MEDIAL AND LATERAL COMPARTMENTS WITH PATELLA RESURFACING  . TUBAL LIGATION  2001    Family History  Problem Relation Age of Onset  . Alcohol abuse Father   . Lung cancer Father        was a smoker  . Emphysema Father        was a smoker  . Heart disease Sister   . Lung cancer Mother        was a smoker  . Stroke Mother   . Other Sister        blood clotting disorder  . Seizures Brother   . Heart disease Maternal Grandfather   . Diabetes Maternal Grandfather   . Heart disease Unknown        uncle  . Diabetes Unknown        uncles/aunts  . Breast cancer Maternal Aunt   . Bipolar disorder Son   . Muscular dystrophy Son   . Heart disease  Son   . Colon cancer Neg Hx     Social history:  reports that she has been smoking cigarettes. She started smoking about 42 years ago. She has a 40.00 pack-year smoking history. She quit smokeless tobacco use about 5 years ago. She reports that she does not drink alcohol or use drugs.    Allergies  Allergen Reactions  . Amoxicillin Anaphylaxis    Breakout, mouth swells  . Azithromycin Swelling and Rash    "swelling all over, high, thrush"  . Cefdinir     Throat swelling after 3rd dose  . Chantix [Varenicline] Other (See Comments)    "bad dreams, difficulty breathing"  . Doxycycline Hyclate Anaphylaxis and Rash    swelling  . Levofloxacin Anaphylaxis  . Penicillins Anaphylaxis, Shortness Of Breath and Rash    "ALMOST DIED, ended up in hospital"  PCN reaction causing immediate rash, facial/tongue/throat swelling, SOB or lightheadedness with hypotension: YES PCN reaction causing severe rash involving mucus membranes or skin necrosis: NO PCN reaction that required hospitalization YES Has patient had a PCN reaction occurring within the last 10 years: NO   . Sulfonamide Derivatives Shortness Of Breath, Swelling and Rash    "break out from head to toe"  . Clarithromycin Hives    swelling  . Nortriptyline Swelling, Rash and Other (See Comments)    Per patient had "kidney problems" on this medication, could not use bathroom (urinary retention)  . Codeine Nausea And Vomiting  . Fluticasone-Salmeterol Other (See Comments)    REACTION: ulcers in mouth  . Triptans Rash    Mouth breaks out and start itching    Medications:  Prior to Admission medications   Medication Sig Start Date End Date Taking? Authorizing Provider  acetaminophen (TYLENOL) 500 MG tablet Take 1,000 mg by mouth every other day.   Yes [provider]  albuterol (PROAIR HFA) 108 (90 Base) MCG/ACT inhaler INHALE 2 PUFFS INTO THE LUNGS EVERY 6 HOURS AS NEEDED FOR SHORTNESS OF BREATH 03/19/18  Yes Tammi Klippel, Sherin,  DO  albuterol (PROVENTIL) (2.5 MG/3ML) 0.083% nebulizer solution USE 1 VIAL IN NEBULIZER EVERY 6 HOURS AS NEEDED FOR SHORTNESS OF BREATH 03/19/18  Yes Abraham, Sherin, DO  CALCITRATE 950 MG tablet TAKE 1 TABLET BY MOUTH TWO TIMES DAILY 06/30/17  Yes [name], MD  capsaicin (ZOSTRIX) 0.025 %  cream Apply topically 2 (two) times daily. 05/12/17  Yes [name], MD  cefdinir (OMNICEF) 300 MG capsule Take 1 capsule (300 mg total) by mouth 2 (two) times daily. 11/04/16  Yes Verner Mould, MD  Cholecalciferol (VITAMIN D3) 2000 units capsule TAKE ONE CAPSULE BY MOUTH DAILY 03/30/18  Yes Tammi Klippel, Sherin, DO  docusate sodium (COLACE) 100 MG capsule TAKE 1 CAPSULE BY MOUTH 2 TIMES DAILY AS NEEDED FOR MILD CONSTIPATION. 02/26/17  Yes [name], MD  DULERA 200-5 MCG/ACT AERO INHALE 2 PUFFS INTO THE LUNGS TWO TIMES DAILY 03/30/18  Yes Caroline More, DO  ferrous sulfate 325 (65 FE) MG tablet Take 1 tablet (325 mg total) by mouth 2 (two) times daily with a meal. Patient taking differently: Take 325 mg by mouth daily with breakfast.  09/06/12  Yes Ollis, Brandi L, NP  fluticasone (FLONASE) 50 MCG/ACT nasal spray SPRAY 2 SPRAYS INTO EACH NOSTRIL DAILY AS NEEDED FOR CONGESTION. 03/30/18  Yes Tammi Klippel, Sherin, DO  guaiFENesin (MUCINEX) 600 MG 12 hr tablet Take 2 tablets (1,200 mg total) by mouth 2 (two) times daily as needed. 10/20/17  Yes Mikell, Jeani Sow, MD  lamoTRIgine (LAMICTAL) 200 MG tablet TAKE ONE TABLET BY MOUTH 2 TIMES A DAY - PLEASE KEEP APPOINTMENT ON 03/09/18. 02/16/18  Yes Kathrynn Ducking, MD  loratadine (CLARITIN) 10 MG tablet TAKE 1 TABLET BY MOUTH EVERY DAY 03/30/18  Yes Tammi Klippel, Sherin, DO  meloxicam (MOBIC) 15 MG tablet Take 1 tablet (15 mg total) by mouth daily. 03/13/17  Yes Verner Mould, MD  NON FORMULARY Use CPAP at bedtime   Yes [provider]  omeprazole (PRILOSEC) 40 MG capsule TAKE ONE CAPSULE BY MOUTH 2 TIMES A DAY  03/30/18  Yes Tammi Klippel, Sherin, DO  polyethylene glycol powder (GLYCOLAX/MIRALAX) powder MIX 17 GMS IN LIQUID AND DRINK ONCE DAILY 02/26/17  Yes [name], MD  sertraline (ZOLOFT) 50 MG tablet Take 1 tablet (50 mg total) by mouth daily. 08/15/17  Yes [name], MD  simvastatin (ZOCOR) 40 MG tablet TAKE 1 TABLET BY MOUTH AT BEDTIME 03/30/18  Yes Abraham, Sherin, DO  topiramate (TOPAMAX) 100 MG tablet TAKE ONE TABLET BY MOUTH EVERY MORNING AND 2 TABLETS AT BEDTIME - MUST KEEP APPOINTMENT 04/02/18 for refills 04/02/18  Yes Kathrynn Ducking, MD  traMADol (ULTRAM) 50 MG tablet Take 1 tablet (50 mg total) by mouth every 12 (twelve) hours as needed. 03/13/17  Yes Verner Mould, MD  VOLTAREN 1 % GEL APPLY 2 GRAMS TOPICALLY TO THE AFFECTED AREA 2 TIMES A DAY AS DIRECTED 03/30/18  Yes Tammi Klippel, Sherin, DO  Fremanezumab-vfrm (AJOVY) 225 MG/1.5ML SOSY Inject 225 mg into the skin every 30 (thirty) days. 04/02/18   Kathrynn Ducking, MD  QUEtiapine (SEROQUEL) 100 MG tablet Take 1 tablet (100 mg total) by mouth at bedtime. 04/02/18   Kathrynn Ducking, MD    ROS:  Out of a complete 14 system review of symptoms, the patient complains only of the following symptoms, and all other reviewed systems are negative.  Shortness of breath Snoring Joint pain, back pain, aching muscles, walking difficulty, neck pain Dizziness, headache, seizures, speech difficulty, weakness, tremors  Blood pressure 137/79, pulse (!) 106, height 5\' 1"  (1.549 m), weight 187 lb (84.8 kg).  Physical Exam  General: The patient is alert and cooperative at the time of the examination.  The patient is moderately to markedly obese.  Skin: No significant peripheral edema is noted.  Neurologic Exam  Mental status: The patient is alert and oriented x 3 at the time of the examination. The patient has apparent normal recent and remote memory, with an apparently normal attention span and concentration  ability.   Cranial nerves: Facial symmetry is present. Speech is normal, no aphasia or dysarthria is noted. Extraocular movements are full.  The patient appears to have end gaze nystagmus bilaterally.  Visual fields are full.  Motor: The patient has good strength in all 4 extremities.  Sensory examination: Soft touch sensation is symmetric on the face, arms, and legs.  Coordination: The patient has good finger-nose-finger and heel-to-shin bilaterally. The Nyan-Barrany procedure was done, the patient does report dizziness and vertigo when turning the head to the right, no definite increase in nystagmus was seen.  Gait and station: The patient has a normal gait. Tandem gait is normal. Romberg is negative. No drift is seen.  Reflexes: Deep tendon reflexes are symmetric.   Assessment/Plan:  1.  History of seizures with recent recurrence  2.  Insomnia  3.  Positional vertigo  4.  Intractable migraine headache  The patient is not sleeping well, and she has had several staring episodes recently, insomnia may be an activated for her seizures.  The Seroquel dosing will be increased to 100 mg at night.  The patient will be sent for blood work today to look at blood levels of the Lamictal.  The patient will be sent for physical therapy for vestibular rehabilitation for probable benign positional vertigo.  The patient will be placed on Ajovy for her migraine headache.  If this is not effective, we will try to get her back on Botox.  The patient will follow-up through this office in 4 months.  A prescription was sent in for the 100 mg Seroquel tablets.  Jill Alexanders MD 04/02/2018 12:48 PM  [facility] [address] [place], [place] [address]  Phone [phone] Fax [phone]

## 2018-04-05 ENCOUNTER — Telehealth: Payer: Self-pay | Admitting: *Deleted

## 2018-04-05 NOTE — Telephone Encounter (Signed)
Initiated PA Ajovy 225mg /0.51ml. Completing paper form since it has to be sent to [facility]

## 2018-04-05 NOTE — Telephone Encounter (Addendum)
Faxed completed/signed form to [facility] at  [phone]. Received fax confirmation. Waiting on determination. Pt has tried: topamax, seroquel, gabapentin, nortriptyline, lamotrigine, tylenol, ibuprofen, hydrocodone, unable to take triptans, botox.

## 2018-04-06 ENCOUNTER — Ambulatory Visit: Payer: Medicaid Other | Admitting: Family Medicine

## 2018-04-06 ENCOUNTER — Telehealth: Payer: Self-pay | Admitting: Neurology

## 2018-04-06 LAB — CBC WITH DIFFERENTIAL/PLATELET
BASOS ABS: 0.1 10*3/uL (ref 0.0–0.2)
Basos: 1 %
EOS (ABSOLUTE): 0.3 10*3/uL (ref 0.0–0.4)
Eos: 4 %
Hematocrit: 34.7 % (ref 34.0–46.6)
Hemoglobin: 11.5 g/dL (ref 11.1–15.9)
IMMATURE GRANULOCYTES: 0 %
Immature Grans (Abs): 0 10*3/uL (ref 0.0–0.1)
LYMPHS: 30 %
Lymphocytes Absolute: 2.4 10*3/uL (ref 0.7–3.1)
MCH: 31.1 pg (ref 26.6–33.0)
MCHC: 33.1 g/dL (ref 31.5–35.7)
MCV: 94 fL (ref 79–97)
MONOS ABS: 0.6 10*3/uL (ref 0.1–0.9)
Monocytes: 8 %
Neutrophils Absolute: 4.5 10*3/uL (ref 1.4–7.0)
Neutrophils: 57 %
PLATELETS: 209 10*3/uL (ref 150–450)
RBC: 3.7 x10E6/uL — AB (ref 3.77–5.28)
RDW: 13.4 % (ref 12.3–15.4)
WBC: 7.9 10*3/uL (ref 3.4–10.8)

## 2018-04-06 LAB — COMPREHENSIVE METABOLIC PANEL
A/G RATIO: 1.3 (ref 1.2–2.2)
ALK PHOS: 108 IU/L (ref 39–117)
ALT: 6 IU/L (ref 0–32)
AST: 12 IU/L (ref 0–40)
Albumin: 4.2 g/dL (ref 3.6–4.8)
BUN/Creatinine Ratio: 11 — ABNORMAL LOW (ref 12–28)
BUN: 14 mg/dL (ref 8–27)
Bilirubin Total: <0.2 mg/dL (ref 0.0–1.2)
CHLORIDE: 109 mmol/L — AB (ref 96–106)
CO2: 21 mmol/L (ref 20–29)
Calcium: 9.1 mg/dL (ref 8.7–10.3)
Creatinine, Ser: 1.24 mg/dL — ABNORMAL HIGH (ref 0.57–1.00)
GFR calc Af Amer: 55 mL/min/{1.73_m2} — ABNORMAL LOW (ref >59)
GFR calc non Af Amer: 47 mL/min/{1.73_m2} — ABNORMAL LOW (ref >59)
GLOBULIN, TOTAL: 3.2 g/dL (ref 1.5–4.5)
Glucose: 119 mg/dL — ABNORMAL HIGH (ref 65–99)
POTASSIUM: 3.9 mmol/L (ref 3.5–5.2)
SODIUM: 141 mmol/L (ref 134–144)
Total Protein: 7.4 g/dL (ref 6.0–8.5)

## 2018-04-06 LAB — LAMOTRIGINE LEVEL: LAMOTRIGINE LVL: 1 ug/mL — AB (ref 2.0–20.0)

## 2018-04-06 MED ORDER — LAMOTRIGINE 200 MG PO TABS: ORAL_TABLET | ORAL | 1 refills | Status: DC

## 2018-04-06 NOTE — Telephone Encounter (Signed)
I called the patient.  The blood work shows a decline in kidney function from the last blood draw less than a year ago.  I will send the primary care physician a report of this.  This will need to be followed.  Chloride level is elevated, likely related to the use of Topamax.  The Lamictal level is low, the patient claims he has not missed any doses prior to the blood being drawn.  We will increase the Lamictal dosing taking 200 mg in the morning and 300 mg in the evening.  I will send in another prescription for this.

## 2018-04-06 NOTE — Telephone Encounter (Signed)
PA approved effective 04/05/18-03/31/19. AQ#:[id number].  Faxed notice of approval to [facility] at [phone]. Received fax confirmation.

## 2018-04-27 ENCOUNTER — Ambulatory Visit: Payer: Medicaid Other

## 2018-04-28 ENCOUNTER — Other Ambulatory Visit: Payer: Self-pay | Admitting: Family Medicine

## 2018-05-27 ENCOUNTER — Other Ambulatory Visit: Payer: Self-pay | Admitting: Family Medicine

## 2018-05-27 ENCOUNTER — Other Ambulatory Visit: Payer: Self-pay | Admitting: Internal Medicine

## 2018-06-01 ENCOUNTER — Ambulatory Visit (INDEPENDENT_AMBULATORY_CARE_PROVIDER_SITE_OTHER): Payer: Medicaid Other | Admitting: Family Medicine

## 2018-06-01 ENCOUNTER — Other Ambulatory Visit: Payer: Self-pay

## 2018-06-01 ENCOUNTER — Encounter: Payer: Self-pay | Admitting: Family Medicine

## 2018-06-01 VITALS — BP 122/82 | HR 79 | Temp 97.9°F | Ht 61.0 in | Wt 188.0 lb

## 2018-06-01 DIAGNOSIS — M1712 Unilateral primary osteoarthritis, left knee: Secondary | ICD-10-CM

## 2018-06-01 DIAGNOSIS — G8929 Other chronic pain: Secondary | ICD-10-CM

## 2018-06-01 DIAGNOSIS — M25562 Pain in left knee: Secondary | ICD-10-CM

## 2018-06-01 DIAGNOSIS — R7989 Other specified abnormal findings of blood chemistry: Secondary | ICD-10-CM | POA: Diagnosis not present

## 2018-06-01 NOTE — Assessment & Plan Note (Addendum)
Worsening left knee pain.  Can consider worsening of osteoarthritis versus new meniscal tear.  No knee imaging since 2015.  Will obtain x-ray of left knee to see extent of worsening of osteoarthritis.  We will also give amatory referral to physical therapy.  Patient was having issues with insurance however discussed this with Kennyth Lose in clinic and she stated Medicaid would cover at least one physical therapy appointment.  Hopeful that they will cover more.  If patient continues to have worsening knee pain despite physical therapy can consider MRI at that time to rule out meniscal pathology given positive McMurray's test.  Advised patient to continue Tylenol as needed as unable to use Toradol or ibuprofen.  Follow up in 1 month if no improvement.

## 2018-06-01 NOTE — Progress Notes (Signed)
Subjective:    Patient ID: [name], female    DOB: 06/06/1956, 62 y.o.   MRN: [id number]   CC: knee pain   HPI: Knee pain Patient presented for follow-up of left knee pain.  States that it is mainly on the medial side of her pain.  States that she has had knee pain for years but recently has become worse.  States that in the past she was told she needs a knee replacement but insurance would not cover physical therapy and orthopedic surgeon would not perform surgery without physical therapy in place.  Patient has tried Tylenol but does not help that much.  Unable to take Toradol due to seizure risk.  Unable to use ibuprofen due to acid reflux.  Patient can still walk but does note she is falling more.  States that she falls twice a week when not using her walker.  States that she uses her walker she does not fall.  Notes that her knee locks when she is walking.  Knee buckled underneath her which caused her to fall.  Pain is worse when she is laying flat.  Objective:  BP 122/82   Pulse 79   Temp 97.9 F (36.6 C) (Oral)   Ht 5\' 1"  (1.549 m)   Wt 188 lb (85.3 kg)   SpO2 96%   BMI 35.52 kg/m  Vitals and nursing note reviewed  General: well nourished, in no acute distress HEENT: normocephalic, no scleral icterus or conjunctival pallor, no nasal discharge, moist mucous membranes  Cardiac: RRR, clear S1 and S2, no murmurs, rubs, or gallops Respiratory: clear to auscultation bilaterally, no increased work of breathing Extremities: no edema or cyanosis. Warm, well perfused.  Knee, left: TTP noted at the medial aspect. Inspection was negative for erythema, ecchymosis, and effusion.Edema noted in medial aspect. No obvious bony abnormalities or signs of osteophyte development. Palpation yielded no asymmetric warmth; Medial joint line tenderness; No condyle tenderness; No patellar tenderness; No patellar crepitus. Patellar and quadriceps tendons unremarkable, and no tenderness of the pes anserine  bursa. No obvious Baker's cyst development. ROM normal in flexion (135 degrees) and extension (0 degrees) but with significant pain. Neurovascularly intact bilaterally.  - Additional tests performed:    - Anterior Drawer >> NEG   - Lachman >> NEG  - Meniscus:   - McMurray's: POSITIVE  - Patella:   - Patellar grind/compression: NEG   - Patellar glide: Without apprehension Skin: warm and dry, no rashes noted Neuro: alert and oriented, no focal deficits   Assessment & Plan:    Knee pain, left Worsening left knee pain.  Can consider worsening of osteoarthritis versus new meniscal tear.  No knee imaging since 2015.  Will obtain x-ray of left knee to see extent of worsening of osteoarthritis.  We will also give amatory referral to physical therapy.  Patient was having issues with insurance however discussed this with Kennyth Lose in clinic and she stated Medicaid would cover at least one physical therapy appointment.  Hopeful that they will cover more.  If patient continues to have worsening knee pain despite physical therapy can consider MRI at that time to rule out meniscal pathology given positive McMurray's test.  Advised patient to continue Tylenol as needed as unable to use Toradol or ibuprofen.  Follow up in 1 month if no improvement.   Discussed patient with Dr. Owens Shark  Return in about 4 weeks (around 06/29/2018), or if symptoms worsen or fail to improve.   Caroline More,  DO, PGY-2

## 2018-06-01 NOTE — Patient Instructions (Signed)
It was a pleasure seeing you today.   Today we discussed your knee pain  For your knee pain: I have ordered an xray. I have also been able to help set up physical therapy.   Please follow up in 1 month if continued pain or sooner if symptoms persist or worsen. Please call the clinic immediately if you have any concerns.   Our clinic's number is [phone]. Please call with questions or concerns.    Thank you,  Caroline More, DO

## 2018-06-01 NOTE — Assessment & Plan Note (Signed)
Patient advised by Dr. [name], neurology, to have BMP drawn.  Per last note from Dr. [name] in chart from 04/06/2018 patient had blood work that showed declining kidney function.  Advised that this will be followed.  Chloride was also elevated at that time likely related to Topamax use.  We will draw a BMP today to trend creatinine level.

## 2018-06-02 ENCOUNTER — Other Ambulatory Visit: Payer: Self-pay | Admitting: Family Medicine

## 2018-06-02 DIAGNOSIS — R7989 Other specified abnormal findings of blood chemistry: Secondary | ICD-10-CM

## 2018-06-02 LAB — BASIC METABOLIC PANEL
BUN / CREAT RATIO: 17 (ref 12–28)
BUN: 18 mg/dL (ref 8–27)
CO2: 23 mmol/L (ref 20–29)
CREATININE: 1.09 mg/dL — AB (ref 0.57–1.00)
Calcium: 9.1 mg/dL (ref 8.7–10.3)
Chloride: 104 mmol/L (ref 96–106)
GFR, EST AFRICAN AMERICAN: 64 mL/min/{1.73_m2} (ref >59)
GFR, EST NON AFRICAN AMERICAN: 55 mL/min/{1.73_m2} — AB (ref >59)
GLUCOSE: 82 mg/dL (ref 65–99)
Potassium: 4.5 mmol/L (ref 3.5–5.2)
Sodium: 141 mmol/L (ref 134–144)

## 2018-06-09 ENCOUNTER — Other Ambulatory Visit: Payer: Self-pay | Admitting: Family Medicine

## 2018-06-10 ENCOUNTER — Other Ambulatory Visit: Payer: Self-pay | Admitting: Family Medicine

## 2018-06-22 ENCOUNTER — Ambulatory Visit: Payer: Medicaid Other | Admitting: Physical Therapy

## 2018-06-23 ENCOUNTER — Other Ambulatory Visit: Payer: Self-pay | Admitting: Family Medicine

## 2018-06-29 DIAGNOSIS — R05 Cough: Secondary | ICD-10-CM | POA: Diagnosis not present

## 2018-06-29 DIAGNOSIS — Z720 Tobacco use: Secondary | ICD-10-CM | POA: Diagnosis not present

## 2018-06-29 DIAGNOSIS — J441 Chronic obstructive pulmonary disease with (acute) exacerbation: Secondary | ICD-10-CM | POA: Diagnosis not present

## 2018-07-01 ENCOUNTER — Ambulatory Visit: Payer: Medicaid Other | Admitting: Family Medicine

## 2018-07-01 NOTE — Progress Notes (Signed)
Subjective:   Patient ID: [name]    DOB: 04-27-1956, 62 y.o. female   MRN: [id number]  [name] is a 62 y.o. female with a history of migraine, CHF, pulmonary HTN, OSA, COPD, GERD, IBS, seizures, DJD, stress incontinence here for   Cough Patient reports chronic cough for about 13 years although recently has had shortness of breath with productive cough with yellow mucus.  She went to [facility] last Tuesday where she was told her lung capacity is 25% and to follow-up with her PCP for need for oxygen.  At that time received chest x-ray that showed chronic lung scarring without pneumonia.  She received a steroid taper, currently has 4-5 days left.  Reports her breathing is better and her cough is still present.  Has previously seen pulmonology but has not seen in a couple of years.  Was previously told she had COPD although had been told by pulmonology in the past that she does not have COPD.  She takes Dulera twice daily every day and [employer] as needed.  Reports using pro-air every day for the last couple of weeks.  Denies any recent antibiotic use.  Has OSA with CPAP but has not used in about a year since she misplaced her CPAP machine during a recent move.  She denies fevers.  Does report some difficulty sleeping due to persistent coughing that is chronic.  Has a 40-year smoking history of 2-3 ppd.  Has previously tried to quit smoking with lozenges and patch, however was only able to quit for a few days at a time.  Is willing to try quitting again.  Review of Systems:  Per HPI.  [place], medications and smoking status reviewed.  Objective:   BP (!) 110/54   Pulse 89   Temp 97.7 F (36.5 C) (Oral)   Ht 5\' 1"  (1.549 m)   Wt 185 lb 4 oz (84 kg)   SpO2 96%   BMI 35.00 kg/m  Vitals and nursing note reviewed.  General: Obese female, elderly, in no acute distress with non-toxic appearance CV: regular rate and rhythm without murmurs, rubs, or gallops Lungs: clear to auscultation  bilaterally with fine rales in LLL. Normal work of breathing, appropriately saturated on room air. Skin: warm, dry, no rashes or lesions Extremities: warm and well perfused, normal tone MSK: ROM grossly intact, strength intact, gait normal Neuro: Alert and oriented, speech normal  Assessment & Plan:   COPD exacerbation ([place]) Current symptoms consistent with COPD exacerbation, currently being treated with steroid taper.  Ambulatory pulse ox completed without demonstrated need for oxygen.  Ordered sleep study to determine need for oxygen at night, optimal CPAP settings as well as ordering new CPAP.  Tobacco cessation counseling provided.  Patient to follow-up with PCP once acute infection resolved to determine optimal inhaler therapy, could consider initiation of ICS.  Tobacco use disorder Encouraged to quit smoking, provided 1-800-QUIT-NOW helpline information.  Advised to make appointment with Dr. Valentina Lucks for tobacco cessation counseling once through acute illness.  Orders Placed This Encounter  Procedures  . Nocturnal polysomnography (NPSG)    Standing Status:   Future    Standing Expiration Date:   07/03/2019    Order Specific Question:   Where should this test be performed:    Answer:   [facility]   No orders of the defined types were placed in this encounter.   Rory Percy, DO PGY-2, [facility] 07/02/2018 5:11 PM

## 2018-07-02 ENCOUNTER — Encounter: Payer: Self-pay | Admitting: Family Medicine

## 2018-07-02 ENCOUNTER — Other Ambulatory Visit: Payer: Self-pay

## 2018-07-02 ENCOUNTER — Ambulatory Visit (INDEPENDENT_AMBULATORY_CARE_PROVIDER_SITE_OTHER): Payer: Medicaid Other | Admitting: Family Medicine

## 2018-07-02 VITALS — BP 110/54 | HR 89 | Temp 97.7°F | Ht 61.0 in | Wt 185.2 lb

## 2018-07-02 DIAGNOSIS — J441 Chronic obstructive pulmonary disease with (acute) exacerbation: Secondary | ICD-10-CM

## 2018-07-02 DIAGNOSIS — F17200 Nicotine dependence, unspecified, uncomplicated: Secondary | ICD-10-CM | POA: Insufficient documentation

## 2018-07-02 DIAGNOSIS — G4733 Obstructive sleep apnea (adult) (pediatric): Secondary | ICD-10-CM | POA: Diagnosis present

## 2018-07-02 NOTE — Patient Instructions (Signed)
It was great to see you!  Our plans for today:  - You do not qualify for oxygen at this time (which is great!). - We ordered a sleep study to titrate your CPAP and determine if you need oxygen at night. - Once you are through your acute illness, come back to be seen to determine if we need to adjust your inhaler regimen. - Quitting smoking is the best thing you can do for your health. Make an appointment with our pharmacist, Dr. Valentina Lucks, for help quitting smoking.   Take care and seek immediate care sooner if you develop any concerns.   Dr. [facility]

## 2018-07-02 NOTE — Assessment & Plan Note (Signed)
Encouraged to quit smoking, provided 1-800-QUIT-NOW helpline information.  Advised to make appointment with Dr. Valentina Lucks for tobacco cessation counseling once through acute illness.

## 2018-07-02 NOTE — Assessment & Plan Note (Addendum)
Current symptoms consistent with COPD exacerbation, currently being treated with steroid taper.  Ambulatory pulse ox completed without demonstrated need for oxygen.  Ordered sleep study to determine need for oxygen at night, optimal CPAP settings as well as ordering new CPAP.  Tobacco cessation counseling provided.  Patient to follow-up with PCP once acute infection resolved to determine optimal inhaler therapy, could consider initiation of ICS.

## 2018-07-14 ENCOUNTER — Ambulatory Visit (INDEPENDENT_AMBULATORY_CARE_PROVIDER_SITE_OTHER): Payer: Self-pay | Admitting: Family Medicine

## 2018-07-14 DIAGNOSIS — F17200 Nicotine dependence, unspecified, uncomplicated: Secondary | ICD-10-CM

## 2018-07-14 DIAGNOSIS — J449 Chronic obstructive pulmonary disease, unspecified: Secondary | ICD-10-CM

## 2018-07-14 DIAGNOSIS — G4733 Obstructive sleep apnea (adult) (pediatric): Secondary | ICD-10-CM

## 2018-07-14 NOTE — Progress Notes (Signed)
Subjective  [name] is a 62 y.o. female is presenting with the following  COPD Seen 11/22 for COPD exacerbation treated with steroids. She feels this is better.  Continues to smoke.   Using dulera regularly.  Does not feel she is wheezing but does have shortness of breath with much exertion.    OSA Lost her CPAP in move in Jan.   Has not tried to contact [facility] for replacement.  She feels she is better rested and sleeps better when she uses it  TOBACCO Smokes at least 1/2 ppd.  Worse when she is stressed.  Wants to stop but they help her cope.  She has never seen counselor for this. Is interested.  She feels cigs contribute to her shortness of breath and is concerned about cancer risks  Chief Complaint noted Review of Symptoms - see HPI PMH - Smoking status noted.    Objective Vital Signs reviewed BP 130/80   Pulse 90   Temp 97.6 F (36.4 C) (Oral)   Wt 190 lb 6.4 oz (86.4 kg)   SpO2 93%   BMI 35.98 kg/m  Psych:  Cognition and judgment appear intact. Alert, communicative  and cooperative with normal attention span and concentration. No apparent delusions, illusions, hallucinations Lungs:  Normal respiratory effort, chest expands symmetrically. Lungs are clear to auscultation, few crackles at bases and occasional wheeze. Heart - Regular rate and rhythm.  No murmurs, gallops or rubs.    Extremities:  No cyanosis, edema, or deformity noted with good range of motion of all major joints.     Assessments/Plans  See after visit summary for details of patient instuctions  Tobacco use disorder Worsened.  This is a major health issue for her.  See after visit summary.  Will discuss with [facility].     Obstructive sleep apnea Not controlled.   See after visit summary.  She will contact [facility] to see if needs another sleep study or if can order CPAP.  Sleep study was ordered by Dr Ky Barban last visit.    COPD GOLD 0  Exacerbation has resolved.  Using [facility] regularly.   Focus on tobacco cessation

## 2018-07-14 NOTE — Assessment & Plan Note (Addendum)
Not controlled.   See after visit summary.  She will contact [facility] to see if needs another sleep study or if can order CPAP.  Sleep study was ordered by Dr Ky Barban last visit.

## 2018-07-14 NOTE — Patient Instructions (Addendum)
Good to see you today!  Thanks for coming in.  For the headache - follow up with Dr [name]  For the Smoking - Set up time to see a counselor - Think of something to substitute - Schedule worry relax time - in the bath  CPAP - call [facility] to see if they can give you another - I will put in an order  See Dr Tammi Klippel in one month

## 2018-07-14 NOTE — Assessment & Plan Note (Signed)
Worsened.  This is a major health issue for her.  See after visit summary.  Will discuss with [facility].

## 2018-07-14 NOTE — Assessment & Plan Note (Signed)
Exacerbation has resolved.  Using [facility] regularly.  Focus on tobacco cessation

## 2018-07-15 ENCOUNTER — Telehealth: Payer: Self-pay | Admitting: Licensed Clinical Social Worker

## 2018-07-15 NOTE — Progress Notes (Signed)
Type of Service: [place] Schedule phone call  [name] is a 62 y.o. female: referred by Dr. Erin Hearing for assistance with managing anxiety and smoking. LCSW called patient to schedule appointment. Patient is pleasant and engaged in conversation.  Patient is concerned about her health and wants to stop smoking. Has not been successful in the past  Psychiatric History - Diagnoses: anxiety - Pharmacotherapy: 50mg  of zoloft,: reports she does not think it is working for her now.  No increase or change in about 5 years. Seroquel is working better since provider increased to 100mg  Medical conditions that might explain or contribute to symptoms:  COPD, obstructive Sleep Apnea  LIFE /SOCIAL : lives 5 adults and 2 children, other smoke in the house which has been a barrier   Recent Life changes: health concerns  INTERVENTION:  Solution-Focused Strategies,  Supportive Counseling   ISSUES DISCUSSED: Integrated care services, support system, previous and current coping skills, tried nicotine gum and patch about a year ago.     PLAN:   1.Patient will meet with LCSW on 07/20/18  2. Patient will talk with family about not smoking in the home.  3. Will put up NO SMOKING SIGNS in the home  Casimer Lanius, [place]   [phone] 12:22 PM

## 2018-07-20 ENCOUNTER — Ambulatory Visit: Payer: Self-pay

## 2018-07-23 ENCOUNTER — Other Ambulatory Visit: Payer: Self-pay | Admitting: Internal Medicine

## 2018-07-23 ENCOUNTER — Other Ambulatory Visit: Payer: Self-pay | Admitting: Family Medicine

## 2018-08-19 ENCOUNTER — Telehealth: Payer: Self-pay

## 2018-08-19 NOTE — Telephone Encounter (Signed)
PA for Ajovy 225 mg/1.5 mL submitted via fax to [facility]. Fax # [phone]. Confirmation fax received.

## 2018-08-30 ENCOUNTER — Ambulatory Visit: Payer: Medicaid Other | Admitting: Nurse Practitioner

## 2018-08-31 ENCOUNTER — Other Ambulatory Visit: Payer: Self-pay | Admitting: Neurology

## 2018-08-31 ENCOUNTER — Other Ambulatory Visit: Payer: Self-pay | Admitting: Family Medicine

## 2018-08-31 NOTE — Telephone Encounter (Addendum)
I contacted the pt. I advised we are waiting on the approval from [employer] regarding her Ajovy. Pt was advised once we hear back on the approval we would notify her. Pt was agreeable and had no further questions/concerns.

## 2018-08-31 NOTE — Telephone Encounter (Signed)
Pt is asking for a call to discuss the paperwork for her Fremanezumab-vfrm (AJOVY) 225 MG/1.5ML SOSY

## 2018-09-01 NOTE — Telephone Encounter (Signed)
PA approval for Ajovy received from [place].  Approval effective 09/01/18-08/27/19.  Confirmation # [id number] Pt and pharm ([facility]) notified.

## 2018-09-02 ENCOUNTER — Other Ambulatory Visit: Payer: Self-pay | Admitting: Internal Medicine

## 2018-09-03 MED ORDER — SERTRALINE HCL 50 MG PO TABS: 50.0000 mg | ORAL_TABLET | Freq: Every day | ORAL | 1 refills | Status: DC

## 2018-09-03 NOTE — Addendum Note (Signed)
Addended by: Christen Bame D on: 09/03/2018 08:40 AM   Modules accepted: Orders

## 2018-09-03 NOTE — Telephone Encounter (Signed)
1st transmission failed.  Resent.  [name], Salome Spotted, CMA

## 2018-09-08 ENCOUNTER — Other Ambulatory Visit: Payer: Self-pay | Admitting: Neurology

## 2018-09-28 ENCOUNTER — Ambulatory Visit: Payer: Medicaid Other | Admitting: Family Medicine

## 2018-10-01 ENCOUNTER — Other Ambulatory Visit: Payer: Self-pay | Admitting: Family Medicine

## 2018-10-01 ENCOUNTER — Other Ambulatory Visit: Payer: Self-pay | Admitting: Neurology

## 2018-10-05 ENCOUNTER — Ambulatory Visit: Payer: Medicaid Other | Admitting: Family Medicine

## 2018-10-15 ENCOUNTER — Other Ambulatory Visit: Payer: Self-pay | Admitting: Neurology

## 2018-10-18 NOTE — Progress Notes (Deleted)
[facility]  PATIENT: [name] DOB: 10/16/1955   REASON FOR VISIT: Follow-up for seizure disorder and intractable migraines, depression HISTORY FROM: Patient    HISTORY OF PRESENT ILLNESS: 8/23/19KWMs. [name] is a 63 year old right-handed white female with a history of intractable migraine headache and a history of seizures.  The patient comes into the office today with several Veronica complaints.  She had been getting Botox injections through the rehab department, but she is no longer doing this as she apparently missed 3 appointments.  The patient indicates that she is having about 3-4 headaches a week.  Over the last 2 months she has not been sleeping well, she will go to sleep at her usual time but then wake up around 2 or 3 in the morning and cannot get back to sleep.  She has had several events of staring off, one lasting up to 10 minutes.  She can hear what people are saying during the events, but she cannot respond.  The patient has not been missing any doses of her Topamax or Lamictal.  The patient also within the last month has developed positional vertigo.  She generally feels well when she is up and doing things, when she lies down and rolls on her right side the vertigo comes on.  The patient does not get nausea or vomiting with this.  When she gets to sleep it does not wake her up.  The patient returns to this office for an evaluation.  UPDATE 5/9/ 2018CM [name], 63 year old female returns for follow-up with history of intractable migraines and a history of seizure disorder. She is on Topamax 100 mg 1 in the morning and 2 at night. She also gets Botox every 3 months. She continues to have mild headaches at times but the Botox has been very beneficial for her. She just had Botox last month. Last seizure occurred in September 2016. She is currently on Lamictal 200 twice a day. She denies side effects to her medication. She remains on Seroquel for depression and anxiety.  She has some problems with left knee  giving out at times. She has seen an orthopedist in the knee replacement but she continues to put that off. She has had a replacement on the right knee previously. Blood pressure well controlled in the office today 132/80. She returns for reevaluation   UPDATE 11/01/2017CM: [name], 63 year old female returns for follow-up. She has a history of intractable migraines and a history of seizure disorder. Last seizure occurred in September 2016. She is on Lamictal 200 mg twice a day and Topamax 100 mg 1 in the morning and 2 at night. She had sinus surgery two weeks ago and she's had more headaches. She's been taking oxycodone. She was referred for Botox with Dr. Ella Bodo and has been getting Botox every 3 months. She is due now. She also complains that she is having trouble urinating and is due to see her primary on Friday. Orthostatic hypotension noted today on exam standing pressure was 90/60. She denies any falls but she does complains of dizziness she returns for reevaluation. She remains on Seroquel.   4/3/17CMMs. [name] is a 63 year old right-handed white female with a history of intractable epilepsy, and intractable headache. The patient is on lamotrigine and Topamax without full control of the seizures, the patient was sent for video EEG monitoring study at [facility], and the patient did not have any seizure episodes during the monitoring period. The patient has not had any seizures  since the video EEG, this was done in September 2016.  The patient is not operating a motor vehicle. She still has frequent headaches, she may have 7 headache free days a month only. The headaches when they do come on last all day long. The patient has been tried on a multitude of medications including lamotrigine, Topamax, nortriptyline, Seroquel, hydrocodone, Advil, and Tylenol. The patient cannot take triptan medications. She may have some nausea, usually not vomiting with the headache. The  headaches are impacting her ability to function during the day. She has an appointment to be evaluated for Botox tomorrow and she was encouraged to keep that appointment. She also had recent CT of the sinuses with  significant sinus disease and was referred to ENT. The patient returns to the office today for an evaluation.   REVIEW OF SYSTEMS: Full 14 system review of systems performed and notable only for those listed, all others are neg:  Constitutional: neg  Cardiovascular: neg Ear/Nose/Throat: neg  Skin: neg Eyes: Blurred vision  Respiratory: Cough chronic Gastroitestinal: neg Genitourinary neg Hematology/Lymphatic: neg  Endocrine: Excessive thirst Musculoskeletal: Back pain, neck pain Allergy/Immunology: neg Neurological: Headache, seizures, tremors, dizziness Psychiatric: Depression and anxiety Sleep : neg   ALLERGIES: Allergies  Allergen Reactions  . Amoxicillin Anaphylaxis    Breakout, mouth swells  . Azithromycin Swelling and Rash    "swelling all over, high, thrush"  . Cefdinir     Throat swelling after 3rd dose  . Chantix [Varenicline] Other (See Comments)    "bad dreams, difficulty breathing"  . Doxycycline Hyclate Anaphylaxis and Rash    swelling  . Levofloxacin Anaphylaxis  . Penicillins Anaphylaxis, Shortness Of Breath and Rash    "ALMOST DIED, ended up in hospital"  PCN reaction causing immediate rash, facial/tongue/throat swelling, SOB or lightheadedness with hypotension: YES PCN reaction causing severe rash involving mucus membranes or skin necrosis: NO PCN reaction that required hospitalization YES Has patient had a PCN reaction occurring within the last 10 years: NO   . Sulfonamide Derivatives Shortness Of Breath, Swelling and Rash    "break out from head to toe"  . Clarithromycin Hives    swelling  . Nortriptyline Swelling, Rash and Other (See Comments)    Per patient had "kidney problems" on this medication, could not use bathroom (urinary  retention)  . Codeine Nausea And Vomiting  . Fluticasone-Salmeterol Other (See Comments)    REACTION: ulcers in mouth  . Triptans Rash    Mouth breaks out and start itching    HOME MEDICATIONS: Outpatient Medications Prior to Visit  Medication Sig Dispense Refill  . acetaminophen (TYLENOL) 500 MG tablet Take 1,000 mg by mouth every other day.    . AJOVY 225 MG/1.5ML SOSY INJECT THE CONTENTS OF 1 SYRINGE (225 MG) SUBCUTANEOUSLY EVERY 30 DAYS 1.5 mL 0  . albuterol (PROAIR HFA) 108 (90 Base) MCG/ACT inhaler INHALE 2 PUFFS INTO THE LUNGS EVERY 6 HOURS AS NEEDED FOR SHORTNESS OF BREATH 8.5 g 0  . albuterol (PROVENTIL) (2.5 MG/3ML) 0.083% nebulizer solution USE 1 VIAL IN NEBULIZER EVERY 6 HOURS AS NEEDED FOR SHORTNESS OF BREATH 75 mL 0  . CALCITRATE 950 MG tablet TAKE 1 TABLET BY MOUTH TWO TIMES DAILY 60 tablet 0  . capsaicin (ZOSTRIX) 0.025 % cream Apply topically 2 (two) times daily. 60 g 0  . Cholecalciferol (VITAMIN D3) 50 MCG (2000 UT) capsule TAKE ONE CAPSULE BY MOUTH DAILY 30 capsule 0  . diclofenac sodium (VOLTAREN) 1 % GEL APPLY 2 GRAMS  TOPICALLY TO THE AFFECTED AREA 2 TIMES A DAY AS DIRECTED 100 g 0  . docusate sodium (COLACE) 100 MG capsule TAKE 1 CAPSULE BY MOUTH 2 TIMES DAILY AS NEEDED FOR MILD CONSTIPATION. 30 capsule 0  . DULERA 200-5 MCG/ACT AERO INHALE 2 PUFFS INTO THE LUNGS TWO TIMES DAILY 13 g 0  . ferrous sulfate 325 (65 FE) MG tablet Take 1 tablet (325 mg total) by mouth 2 (two) times daily with a meal. (Patient taking differently: Take 325 mg by mouth daily with breakfast. ) 60 tablet 3  . fluticasone (FLONASE) 50 MCG/ACT nasal spray SPRAY 2 SPRAYS INTO EACH NOSTRIL DAILY AS NEEDED FOR CONGESTION. 16 g 0  . guaiFENesin (MUCINEX) 600 MG 12 hr tablet Take 2 tablets (1,200 mg total) by mouth 2 (two) times daily as needed. 30 tablet 0  . lamoTRIgine (LAMICTAL) 200 MG tablet TAKE ONE TABLET BY MOUTH EVERY MORNING AND 1 & 1/2 TABLETS IN THE EVENING 75 tablet 3  . loratadine  (CLARITIN) 10 MG tablet TAKE 1 TABLET BY MOUTH EVERY DAY 30 tablet 0  . meloxicam (MOBIC) 15 MG tablet Take 1 tablet (15 mg total) by mouth daily. (Patient not taking: Reported on 07/14/2018) 90 tablet 1  . NON FORMULARY Use CPAP at bedtime    . omeprazole (PRILOSEC) 40 MG capsule TAKE ONE CAPSULE BY MOUTH 2 TIMES A DAY 60 capsule 0  . polyethylene glycol powder (GLYCOLAX/MIRALAX) powder MIX 17 GMS IN LIQUID AND DRINK ONCE DAILY 527 g 0  . QUEtiapine (SEROQUEL) 100 MG tablet Take 1 tablet (100 mg total) by mouth at bedtime. (Patient not taking: Reported on 07/14/2018) 90 tablet 1  . sertraline (ZOLOFT) 50 MG tablet Take 1 tablet (50 mg total) by mouth daily. 30 tablet 1  . simvastatin (ZOCOR) 40 MG tablet TAKE 1 TABLET BY MOUTH AT BEDTIME 30 tablet 0  . topiramate (TOPAMAX) 100 MG tablet TAKE ONE TABLET BY MOUTH EVERY MORNING AND 2 TABLETS AT BEDTIME - MUST KEEP APPOINTMENT 04/02/18 for refills 270 tablet 3   No facility-administered medications prior to visit.     PAST MEDICAL HISTORY: Past Medical History:  Diagnosis Date  . Allergic rhinitis   . Arthritis    "hands and legs and feet" (04/27/2012)  . Cancer ([place]) 05   rt arm mole rem cancer  . Chronic bronchitis ([place])    "keep it all the time" (04/27/2012)  . COPD (chronic obstructive pulmonary disease) ([place])   . Depression   . Diverticulosis   . DJD (degenerative joint disease)   . Emphysema   . Epilepsy ([place])   . Esophageal stricture   . Exertional dyspnea   . GERD (gastroesophageal reflux disease)   . Glaucoma   . H/O blood clots    Right leg  . Hyperlipidemia   . Hypertension   . IBS (irritable bowel syndrome)   . Migraine   . Neck pain    Cervical disc  . OSA on CPAP    6-7 yrs; pt wears CPAP but has not been able to use it lately due to sinus issues  . Pneumonia 2012; 04/27/2012  . Right leg DVT ([place]) 1982   groin  . Seizures ([place])   . Type II diabetes mellitus ([place])    No meds since weight loss  . UTI (urinary  tract infection)    2 weeks ago  . Vertigo 10/24/2014    PAST SURGICAL HISTORY: Past Surgical History:  Procedure Laterality Date  . ABDOMINAL  HYSTERECTOMY  2001   "partial"  . CARPAL TUNNEL RELEASE  ~ 2008   right  . CATARACT EXTRACTION Bilateral   . COLONOSCOPY WITH ESOPHAGOGASTRODUODENOSCOPY (EGD)    . KNEE ARTHROSCOPY  1990's   left  . NASAL SEPTOPLASTY W/ TURBINOPLASTY Bilateral 05/23/2016   Procedure: NASAL SEPTOPLASTY WITH TURBINATE REDUCTION;  Surgeon: Jerrell Belfast, MD;  Location: [facility];  Service: ENT;  Laterality: Bilateral;  . SHOULDER OPEN ROTATOR CUFF REPAIR  ~ 2010   left  . SINUS ENDO WITH FUSION Right 05/23/2016   Procedure: LIMITED RIGHT ENDOSCOPIC SINUS SURGERY;  Surgeon: Jerrell Belfast, MD;  Location: [facility];  Service: ENT;  Laterality: Right;  . TOTAL KNEE ARTHROPLASTY  08/23/2012   right  . TOTAL KNEE ARTHROPLASTY  08/23/2012   Procedure: TOTAL KNEE ARTHROPLASTY;  Surgeon: Lorn Junes, MD;  Location: [facility];  Service: Orthopedics;  Laterality: Right;  RIGHT KNEE ARTHROPLASTY MEDIAL AND LATERAL COMPARTMENTS WITH PATELLA RESURFACING  . TUBAL LIGATION  2001    FAMILY HISTORY: Family History  Problem Relation Age of Onset  . Alcohol abuse Father   . Lung cancer Father        was a smoker  . Emphysema Father        was a smoker  . Heart disease Sister   . Lung cancer Mother        was a smoker  . Stroke Mother   . Other Sister        blood clotting disorder  . Seizures Brother   . Heart disease Maternal Grandfather   . Diabetes Maternal Grandfather   . Heart disease Unknown        uncle  . Diabetes Unknown        uncles/aunts  . Breast cancer Maternal Aunt   . Bipolar disorder Son   . Muscular dystrophy Son   . Heart disease Son   . Colon cancer Neg Hx     SOCIAL HISTORY: Social History   Socioeconomic History  . Marital status: Married    Spouse name: [name]  . Number of children: 2  . Years of education: 83  . Highest education level:  Not on file  Occupational History  . Occupation: disabled  Social Needs  . Financial resource strain: Not on file  . Food insecurity:    Worry: Not on file    Inability: Not on file  . Transportation needs:    Medical: Not on file    Non-medical: Not on file  Tobacco Use  . Smoking status: Current Every Day Smoker    Packs/day: 1.00    Years: 40.00    Pack years: 40.00    Types: Cigarettes    Start date: 08/12/1975  . Smokeless tobacco: Former Systems developer    Quit date: 08/23/2012  . Tobacco comment: started back smoking in 11/2012  Substance and Sexual Activity  . Alcohol use: No    Alcohol/week: 0.0 standard drinks  . Drug use: No  . Sexual activity: Never  Lifestyle  . Physical activity:    Days per week: Not on file    Minutes per session: Not on file  . Stress: Not on file  Relationships  . Social connections:    Talks on phone: Not on file    Gets together: Not on file    Attends religious service: Not on file    Active member of club or organization: Not on file    Attends meetings of clubs or organizations:  Not on file    Relationship status: Not on file  . Intimate partner violence:    Fear of current or ex partner: Not on file    Emotionally abused: Not on file    Physically abused: Not on file    Forced sexual activity: Not on file  Other Topics Concern  . Not on file  Social History Narrative   Patient lives at home with husband [name], her son and his wife.    Patient has 2 children.    Patient has a high school education.    Patient is currently unemployed.    Patient is right handed.    Patient drinks 2 cups of caffeine per day.     PHYSICAL EXAM  Vitals:   06/11/16 1528  BP: 132/80  Pulse: 81  Weight: 174 lb (78.9 kg)  Height: 5\' 1"  (1.549 m)   There is no height or weight on file to calculate BMI.  Generalized: Well developed, obese female in no acute distress  Head: normocephalic and atraumatic,. Oropharynx benign  Neck: Supple, no carotid  bruits  Cardiac: Regular rate rhythm, no murmur  Musculoskeletal: No deformity   Neurological examination   Mentation: Alert oriented to time, place, history taking. Attention span and concentration appropriate. Recent and remote memory intact.  Follows all commands speech and language fluent.   Cranial nerve II-XII: Pupils were equal round reactive to light extraocular movements were full, visual field were full on confrontational test. Facial sensation and strength were normal. hearing was intact to finger rubbing bilaterally. Uvula tongue midline. head turning and shoulder shrug were normal and symmetric.Tongue protrusion into cheek strength was normal. Motor: normal bulk and tone, full strength in the BUE, BLE,  Sensory: normal and symmetric to light touch, pinprick, and  Vibration in the upper and lower extremities,  Coordination: finger-nose-finger, heel-to-shin bilaterally, no dysmetria Reflexes: Symmetric upper and lower, plantar responses were flexor bilaterally. Gait and Station: Rising up from seated position without assistance, wide base unsteady gait , tandem gait not attempted Romberg is negative . No assistive device  DIAGNOSTIC DATA (LABS, IMAGING, TESTING) - I reviewed patient records, labs, notes, testing and imaging myself where available.  Lab Results  Component Value Date   WBC 7.9 04/02/2018   HGB 11.5 04/02/2018   HCT 34.7 04/02/2018   MCV 94 04/02/2018   PLT 209 04/02/2018      Component Value Date/Time   NA 141 06/01/2018 1555   K 4.5 06/01/2018 1555   CL 104 06/01/2018 1555   CO2 23 06/01/2018 1555   GLUCOSE 82 06/01/2018 1555   GLUCOSE 82 06/13/2016 1441   BUN 18 06/01/2018 1555   CREATININE 1.09 (H) 06/01/2018 1555   CREATININE 0.91 06/13/2016 1441   CALCIUM 9.1 06/01/2018 1555   PROT 7.4 04/02/2018 1240   ALBUMIN 4.2 04/02/2018 1240   AST 12 04/02/2018 1240   ALT 6 04/02/2018 1240   ALKPHOS 108 04/02/2018 1240   BILITOT <0.2 04/02/2018 1240    GFRNONAA 55 (L) 06/01/2018 1555   GFRNONAA 70 06/13/2016 1441   GFRAA 64 06/01/2018 1555   GFRAA 80 06/13/2016 1441       ASSESSMENT AND PLAN  63 y.o. year old female  has a past medical history of Intractable epilepsy and intractable migraine headaches. She has not had further seizure activity since monitoring at [facility] in September 2016. She is currently on Topamax and Lamictal. She has been receiving Botox every 3 months with Dr. Ella Bodo. Marland Kitchen  PLAN: Continue Lamictal 200mg  twice daily  Continue Topamax  100mg  in the am and 200mg  in the pm  Continue Seroquel at current dose will refill Continue Botox for migraines Stay well hydrated F/U in 8 months I spent 20 min in total face to face time with the patient more than 50% of which was spent counseling and coordination of care, reviewing test results reviewing medications and discussing and reviewing the diagnosis of intractable migraine and seizure disorder and further treatment options if necessary. , Dennie Bible, GNP, [facility], APRN History of seizures with recent recurrence  2.  Insomnia  3.  Positional vertigo  4.  Intractable migraine headache  The patient is not sleeping well, and she has had several staring episodes recently, insomnia may be an activated for her seizures.  The Seroquel dosing will be increased to 100 mg at night.  The patient will be sent for blood work today to look at blood levels of the Lamictal.  The patient will be sent for physical therapy for vestibular rehabilitation for probable benign positional vertigo.  The patient will be placed on Ajovy for her migraine headache.  If this is not effective, we will try to get her back on Botox.  The patient will follow-up through this office in 4 months.  A prescription was sent in for the 100 mg Seroquel tablets. [facility] [address], [place], [place] [address] [phone]

## 2018-10-19 ENCOUNTER — Ambulatory Visit: Payer: Medicaid Other | Admitting: Nurse Practitioner

## 2018-10-26 ENCOUNTER — Ambulatory Visit: Payer: Medicaid Other | Admitting: Neurology

## 2018-10-27 ENCOUNTER — Other Ambulatory Visit: Payer: Self-pay | Admitting: Family Medicine

## 2018-10-29 ENCOUNTER — Telehealth (INDEPENDENT_AMBULATORY_CARE_PROVIDER_SITE_OTHER): Payer: Medicaid Other | Admitting: Family Medicine

## 2018-10-29 DIAGNOSIS — J441 Chronic obstructive pulmonary disease with (acute) exacerbation: Secondary | ICD-10-CM

## 2018-10-29 MED ORDER — PREDNISONE 20 MG PO TABS: 40.0000 mg | ORAL_TABLET | Freq: Every day | ORAL | 0 refills | Status: AC

## 2018-10-29 NOTE — Telephone Encounter (Signed)
[facility] Telemedicine Visit  Patient consented to have visit conducted via telephone.  Encounter participants: Patient: KAMARIYA BLEVENS  Provider: Caroline More  Others (if applicable): None  Chief Complaint: Bronchitis  HPI: Bronchitis Patient calling in with concerns of possible bronchitis.  States that 2 days ago she started to feel like she had a very bad cold and had some shortness of breath.  States that she now feels short of breath all the time, has been using her albuterol nebulizer twice a day.  On the phone patient was able to speak full sentences without becoming short of breath.  States that she has not had any fevers.  Has some green sputum production but this is not increasing.  Patient states she gets bronchitis all the time and this feels exactly like this.  Has not left the house or has been exposed to anyone that is sick.  Does continue to smoke, 1 pack/day.  ROS: see HPI, other ROS negative  Pertinent PMHx: COPD, chronic bronchitis, tobacco abuse, CHF, allergies to antibiotics   Assessment/Plan:  COPD exacerbation ([place]) Patient with likely COPD exacerbation.  No increased sputum production at this time.  Given patient has extensive antibiotic allergies, anaphylaxis or throat swelling is related to many antibiotics, not give antibiotics at this time.  Reassuring that patient is not having increased sputum production as well.  Will still continue to give steroids, prednisone 40 mg x 5 days.  Given concerns of coronavirus discussed with patient of possible need to go to the emergency room to get chest x-ray or pulse ox.  Would recommend tobacco cessation as well.  Strict return precautions given.  Advised that if patient does not improve with steroids she should call back immediately as she may need further testing for coronavirus or to seen in the emergency department.  Discussed patient with Dr. Owens Shark.    Time spent on phone with patient: 20  minutes

## 2018-10-29 NOTE — Assessment & Plan Note (Signed)
Patient with likely COPD exacerbation.  No increased sputum production at this time.  Given patient has extensive antibiotic allergies, anaphylaxis or throat swelling is related to many antibiotics, not give antibiotics at this time.  Reassuring that patient is not having increased sputum production as well.  Will still continue to give steroids, prednisone 40 mg x 5 days.  Given concerns of coronavirus discussed with patient of possible need to go to the emergency room to get chest x-ray or pulse ox.  Would recommend tobacco cessation as well.  Strict return precautions given.  Advised that if patient does not improve with steroids she should call back immediately as she may need further testing for coronavirus or to seen in the emergency department.  Discussed patient with Dr. Owens Shark.

## 2018-10-29 NOTE — Telephone Encounter (Signed)
Agree with below, alternatively clindamycin 600 mg q8h.   Dorris Singh, MD  Family Medicine Teaching Service

## 2018-10-29 NOTE — Telephone Encounter (Signed)
Addendum:  Discussed further with Dr. Owens Shark. If patient worsens of the weekend can give trial of clindamycin 450mg  q8h.   Patient was charged for this visit  Caroline More, DO, PGY-2 [facility] 10/29/2018 2:43 PM

## 2018-11-16 ENCOUNTER — Other Ambulatory Visit: Payer: Self-pay | Admitting: Neurology

## 2018-11-18 ENCOUNTER — Telehealth: Payer: Self-pay

## 2018-11-18 NOTE — Telephone Encounter (Signed)
Spoke with the patient in regards to her appt on 11/23/2018 with Butler Denmark, NP. She doesn't have access to a smart phone or Internet so is unable to do a webex visit. She did consent to telephone visit and to file her insurance

## 2018-11-23 ENCOUNTER — Ambulatory Visit (INDEPENDENT_AMBULATORY_CARE_PROVIDER_SITE_OTHER): Payer: Medicaid Other | Admitting: Neurology

## 2018-11-23 ENCOUNTER — Encounter: Payer: Self-pay | Admitting: Neurology

## 2018-11-23 ENCOUNTER — Other Ambulatory Visit: Payer: Self-pay

## 2018-11-23 DIAGNOSIS — G40909 Epilepsy, unspecified, not intractable, without status epilepticus: Secondary | ICD-10-CM | POA: Diagnosis not present

## 2018-11-23 MED ORDER — TOPIRAMATE 100 MG PO TABS: ORAL_TABLET | ORAL | 3 refills | Status: DC

## 2018-11-23 MED ORDER — FREMANEZUMAB-VFRM 225 MG/1.5ML ~~LOC~~ SOSY: 225.0000 mg | PREFILLED_SYRINGE | SUBCUTANEOUS | 5 refills | Status: DC

## 2018-11-23 MED ORDER — QUETIAPINE FUMARATE 100 MG PO TABS: 100.0000 mg | ORAL_TABLET | Freq: Every day | ORAL | 1 refills | Status: DC

## 2018-11-23 NOTE — Progress Notes (Signed)
I have read the note, and I agree with the clinical assessment and plan.  Charles K Willis   

## 2018-11-23 NOTE — Progress Notes (Signed)
Virtual Visit via Telephone Note  I connected with [name] on 11/23/18 at 10:15 AM EDT by telephone and verified that I am speaking with the correct person using two identifiers.   I discussed the limitations, risks, security and privacy concerns of performing an evaluation and management service by telephone and the availability of in person appointments. I also discussed with the patient that there may be a patient responsible charge related to this service. The patient expressed understanding and agreed to proceed.   History of Present Illness: 11/23/2018 SS: [name] is a 63 year old female with history of seizures and migraine headache. With her seizures, she will staring off events. At her last visit Seroquel was increased to 100 mg at night. She complained of a new issue of vertigo, she was sent to physical therapy for vestibular rehabilitation for probable benign positional vertigo. She was started on Ajovy. Her Lamictal level was low and was increased to taking 200 mg in the morning, 300 mg in the evening. She had a decline in her kidney function and was to be followed by her primary care provider. In the past she has tried Botox for her migraines and it was beneficial.   Patient reports her headaches are better with the Ajovy.  She reports for 2 weeks she will have no headache, after 2 weeks her headaches may start to return.  In the past she has had daily headaches.  She reports she continues to have staring off spells, happening about twice a month.  She reports no change since her Lamictal has been increased.  In the past she has had grand mal seizures, she has not had one in some time.  During her staring off episodes she will hear things, but she cannot respond.  The episodes last no longer than 10 minutes, once they subside she is back to baseline.  There is no incontinence during the episode.  The increase in her Seroquel was helpful to her sleep.  Her dizziness has improved,  occasionally will have a dizzy spell.  She reports she was never contacted by physical therapy for vestibular rehabilitation.  At this time she does not think she needs this.  She does not drive, she does not have a driver's license.  She does follow-up with primary care, her creatinine was rechecked, was better.  She manages her medications, she denies any problems.  She is also taking Topamax 100 mg, 1 tablet in the morning, 2 tablets at bedtime.  04/02/2018 Dr. [name]: [name] is a 63 year old right-handed white female with a history of intractable migraine headache and a history of seizures.  The patient comes into the office today with several new complaints.  She had been getting Botox injections through the rehab department, but she is no longer doing this as she apparently missed 3 appointments.  The patient indicates that she is having about 3-4 headaches a week.  Over the last 2 months she has not been sleeping well, she will go to sleep at her usual time but then wake up around 2 or 3 in the morning and cannot get back to sleep.  She has had several events of staring off, one lasting up to 10 minutes.  She can hear what people are saying during the events, but she cannot respond.  The patient has not been missing any doses of her Topamax or Lamictal.  The patient also within the last month has developed positional vertigo.  She generally feels  well when she is up and doing things, when she lies down and rolls on her right side the vertigo comes on.  The patient does not get nausea or vomiting with this.  When she gets to sleep it does not wake her up.  The patient returns to this office for an evaluation.   Observations/Objective: Alert, answers questions appropriately, speech is clear and concise  Assessment and Plan: 1.  Migraine headaches Overall her headaches are much improved with Ajovy, she reports for 2 weeks she will have no headache.  Her headaches do start to return in 2 weeks after the  injection.  This is a great improvement from having daily headaches.  She is also taking Topamax 100 mg in the morning, 200 mg at bedtime.  2.  History of seizures, recurrence of staring off spells She continues to report staring off episodes, happening about twice per month.  At her last visit her Lamictal level was found to be low, it was increased to taking 200 mg in the morning, 300 mg in the evening.  She reports compliance with the medications.  I will check a Lamictal level, basic blood work today.  We may need to adjust her medications once blood work returns, I will send in a refill for Lamictal at that time.  She will come into the office in the next week to have blood work done.  3.  Insomnia She did see an improvement in the increase in Seroquel taking 100 mg at bedtime.  She is now sleeping better.  I will send in a refill for the medication.   Follow Up Instructions: 3 to 4 months in office for revisit   I discussed the assessment and treatment plan with the patient. The patient was provided an opportunity to ask questions and all were answered. The patient agreed with the plan and demonstrated an understanding of the instructions.   The patient was advised to call back or seek an in-person evaluation if the symptoms worsen or if the condition fails to improve as anticipated.  I provided 25 minutes of non-face-to-face time during this encounter.   Evangeline Dakin, DNP  [facility] [address], [place], [place] [address] [phone]

## 2018-11-26 ENCOUNTER — Other Ambulatory Visit: Payer: Self-pay | Admitting: Family Medicine

## 2018-12-02 ENCOUNTER — Telehealth: Payer: Self-pay | Admitting: Family Medicine

## 2018-12-02 NOTE — Telephone Encounter (Signed)
Pt called this morning about her falling yesterday, and wanted to see if she could get a prescription called in for her knee. Please give patient a call back.

## 2018-12-02 NOTE — Telephone Encounter (Signed)
Please have patient call in for telemed visit.   Caroline More, DO PGY-2 12/02/18 @1 :28PM

## 2018-12-06 ENCOUNTER — Telehealth (INDEPENDENT_AMBULATORY_CARE_PROVIDER_SITE_OTHER): Payer: Medicaid Other | Admitting: Family Medicine

## 2018-12-06 ENCOUNTER — Other Ambulatory Visit: Payer: Self-pay

## 2018-12-06 DIAGNOSIS — M25562 Pain in left knee: Secondary | ICD-10-CM

## 2018-12-06 DIAGNOSIS — M25569 Pain in unspecified knee: Secondary | ICD-10-CM | POA: Insufficient documentation

## 2018-12-06 DIAGNOSIS — G8929 Other chronic pain: Secondary | ICD-10-CM | POA: Insufficient documentation

## 2018-12-06 MED ORDER — CAPSAICIN 0.075 % EX CREA: TOPICAL_CREAM | Freq: Two times a day (BID) | CUTANEOUS | Status: DC

## 2018-12-06 NOTE — Progress Notes (Signed)
[facility] Telemedicine Visit  Patient consented to have virtual visit. Method of visit: Telephone  Encounter participants: Patient: [name] - located at home Provider: Benay Pike - located at [facility] Others (if applicable): None  Chief Complaint: Left leg and hip pain.     HPI: Patient states that she has had chronic left knee pain due to arthritis and needing her knee replacement.  She says last week she fell on her left knee and hip and has had pain ever since that time.  She is able to ambulate with a walker.  Before this she was taking Tylenol every now and then for pain but states it did not help.  Since she fell she has been taking about 750 mg of Tylenol every 4 hours as well as 200 mg of a protein every 6 hours.  She says this does not help.  He is also tried using ice and heat.  She says when she fell she was going to the bathroom at about 2 AM and her leg just gave out.  She says she is always following.  She did not hit her head, or lose consciousness.  She says it has been constant pain since that time 10 out of 10 in intensity.  She says her knee has always been swollen but now it is more swollen than before.  Patient has had a chronic cough for a month.  She states she was diagnosed with bronchitis.     Has a history of seizure and therefore cannot take tramadol.   ROS: per HPI  Pertinent PMHx: Seizure, osteoarthritis.  Exam:  Respiratory: Patient able to speak in full sentences during this encounter.  Assessment/Plan:  Knee pain, acute Patient has had left knee and hip pain, knee greater than hip consistently since last Wednesday after she fell down.  Patient is able ambulate with a walker.  Patient says is a constant 10 out of 10 pain that gets no relief from pain medication topical treatments, or heat or ice.  Patient states she cannot take tramadol due to seizure disorder. - Ordered knee and hip x-ray for [facility] imaging.  Patient is  to call to set up an appointment.  Need to rule out fracture of her patella or, less likely, hip. - Put an amatory referral for orthopedic surgeon because patient stated she had been several years since her last referral and she needed a new one before she could see this doctor again. - Ordered topical capsaicin, although do not expect patient to receive much relief from this.  Unable to receive tramadol, pain is unresponsive to other analgesic medication.  Most important thing is that we rule out a fracture.    Time spent during visit with patient: 20 minutes

## 2018-12-06 NOTE — Assessment & Plan Note (Signed)
Patient has had left knee and hip pain, knee greater than hip consistently since last Wednesday after she fell down.  Patient is able ambulate with a walker.  Patient says is a constant 10 out of 10 pain that gets no relief from pain medication topical treatments, or heat or ice.  Patient states she cannot take tramadol due to seizure disorder. - Ordered knee and hip x-ray for [facility] imaging.  Patient is to call to set up an appointment.  Need to rule out fracture of her patella or, less likely, hip. - Put an amatory referral for orthopedic surgeon because patient stated she had been several years since her last referral and she needed a new one before she could see this doctor again. - Ordered topical capsaicin, although do not expect patient to receive much relief from this.  Unable to receive tramadol, pain is unresponsive to other analgesic medication.  Most important thing is that we rule out a fracture.

## 2018-12-27 ENCOUNTER — Other Ambulatory Visit: Payer: Self-pay | Admitting: Family Medicine

## 2019-01-07 ENCOUNTER — Other Ambulatory Visit: Payer: Self-pay | Admitting: Family Medicine

## 2019-01-17 ENCOUNTER — Other Ambulatory Visit: Payer: Self-pay | Admitting: Neurology

## 2019-01-17 ENCOUNTER — Ambulatory Visit: Payer: Medicaid Other | Admitting: Family Medicine

## 2019-01-17 NOTE — Progress Notes (Deleted)
   Subjective:    Patient ID: [name], female    DOB: [birth year], 63 y.o.   MRN: [id number]   CC: back/neck pain s/p fall   HPI: [name] is a 63 year old female presenting discuss the following:  Back/neck pain: Started after following a few weeks ago**.  She had a telemedicine visit with Dr. Jeannine Kitten at the end of April after the fall, who ordered hip and knee x-rays at that time which were never completed.   Smoking status reviewed  Review of Systems Per HPI, also denies recent illness, fever, headache, changes in vision, chest pain, shortness of breath, abdominal pain, N/V/D, weakness   Patient Active Problem List   Diagnosis Date Noted  . Knee pain, acute 12/06/2018  . Tobacco use disorder 07/02/2018  . Elevated serum creatinine 06/01/2018  . Chronic bronchitis ([place]) 01/14/2017  . Claudication ([place]) 04/09/2016  . Midline low back pain without sciatica 02/26/2016  . Seizure disorder ([place]) 11/12/2015  . 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 Chronic obstructive pulmonary disease ([place]) 04/25/2015  . Seizure ([name])   . Syncope, tussive   . Syncope 08/05/2014  . Pulmonary hypertension ([place]) 08/06/2013  . Diastolic CHF ([place]) 27/51/7001  . COPD exacerbation ([place]) 03/29/2013  . BPPV (benign paroxysmal positional vertigo) 03/09/2013  . Chronic respiratory failure ([place]) 11/01/2012  . Hemorrhoid 08/12/2012  . Left shoulder pain 06/27/2012  . ANXIETY 02/13/2010  . PANIC ATTACK 10/31/2009  . Obstructive sleep apnea 02/19/2009  . SINUSITIS, CHRONIC 08/17/2008  . Vitamin D deficiency 01/21/2008  . OSTEOPENIA 11/24/2007  . Irritable bowel syndrome 11/17/2007  . ALLERGIC RHINITIS 09/24/2007  . COPD GOLD 0  05/12/2007  . Hyperlipidemia 10/08/2006  . OBESITY, NOS  BMI 33.8 10/08/2006  . Cigarette smoker 10/08/2006  . Intractable migraine without aura 10/08/2006  . GLAUCOMA 10/08/2006  . GASTROESOPHAGEAL REFLUX, NO ESOPHAGITIS 10/08/2006   . HERNIA, HIATAL, NONCONGENITAL 10/08/2006  . INCONTINENCE, STRESS, FEMALE 10/08/2006  . DJD (degenerative joint disease) of knee 10/08/2006  . Convulsions ([place]) 10/08/2006     Objective:  There were no vitals taken for this visit. Vitals and nursing note reviewed  General: NAD, pleasant Cardiac: RRR, normal heart sounds, no murmurs Respiratory: CTAB, normal effort Abdomen: soft, nontender, nondistended Extremities: no edema or cyanosis. WWP. Skin: warm and dry, no rashes noted Neuro: alert and oriented, no focal deficits Psych: normal affect  Assessment & Plan:    No problem-specific Assessment & Plan notes found for this encounter.    Darrelyn Hillock, DO Family Medicine Resident PGY-1

## 2019-01-18 ENCOUNTER — Ambulatory Visit (INDEPENDENT_AMBULATORY_CARE_PROVIDER_SITE_OTHER): Payer: Medicaid Other | Admitting: Family Medicine

## 2019-01-18 ENCOUNTER — Other Ambulatory Visit: Payer: Self-pay

## 2019-01-18 ENCOUNTER — Ambulatory Visit
Admission: RE | Admit: 2019-01-18 | Discharge: 2019-01-18 | Disposition: A | Discharge status: Home / Self Care | Payer: Medicaid Other | Source: Ambulatory Visit | Attending: Family Medicine | Admitting: Family Medicine

## 2019-01-18 VITALS — BP 100/64 | HR 82

## 2019-01-18 DIAGNOSIS — R29898 Other symptoms and signs involving the musculoskeletal system: Secondary | ICD-10-CM

## 2019-01-18 DIAGNOSIS — M25552 Pain in left hip: Secondary | ICD-10-CM | POA: Diagnosis not present

## 2019-01-18 DIAGNOSIS — S79911A Unspecified injury of right hip, initial encounter: Secondary | ICD-10-CM | POA: Diagnosis not present

## 2019-01-18 DIAGNOSIS — Y92009 Unspecified place in unspecified non-institutional (private) residence as the place of occurrence of the external cause: Secondary | ICD-10-CM | POA: Insufficient documentation

## 2019-01-18 DIAGNOSIS — W19XXXA Unspecified fall, initial encounter: Secondary | ICD-10-CM

## 2019-01-18 DIAGNOSIS — M25562 Pain in left knee: Secondary | ICD-10-CM

## 2019-01-18 DIAGNOSIS — M25551 Pain in right hip: Secondary | ICD-10-CM | POA: Diagnosis not present

## 2019-01-18 DIAGNOSIS — S79912A Unspecified injury of left hip, initial encounter: Secondary | ICD-10-CM | POA: Diagnosis not present

## 2019-01-18 DIAGNOSIS — M542 Cervicalgia: Secondary | ICD-10-CM | POA: Diagnosis not present

## 2019-01-18 DIAGNOSIS — S3992XA Unspecified injury of lower back, initial encounter: Secondary | ICD-10-CM | POA: Diagnosis not present

## 2019-01-18 NOTE — Progress Notes (Signed)
Subjective:  [name] is a 63 y.o. female who presents to the [facility] today with a chief complaint of back pain and weakness in legs s/p falls.   HPI: Patient says she has a history of multiple falls.  She has a slightly unsteady gait and is claiming increasing weakness of legs.  She says that this is a chronic issue but is gotten significantly worse since a fall approximately 1 month ago.  She expresses generalized bilateral backaches in the lower back.  She says this is non-to the midline.  She says that she does have a history of incontinence but has gotten significantly worse in the last month.  She denies any saddle anesthesia.  She says when she falls she generally falls "backwards, but later in her history taking she says that when she falls she falls forward because she uses a rolling walker.  She denies any suggestion of using a sliding walker.  She denies tripping and although she does have a history of seizures that she has not had one in a year she does not think that is contributory to these falls.  Does have a history of vertigo but denies that this is the cause of her falls.  Has been following with neurology.  Although we cannot see the notes she has been following with [facility], she says that after her fall a month ago they did do hip and femur x-rays and found there were no fractures.  As a separate issue she has had a right knee replacement, says that they have suggested a left knee replacement but since Medicaid will not pay for physical therapy they will not do the surgery.   Objective:  Physical Exam: BP 100/64   Pulse 82   SpO2 97%   Gen: NAD, conversing comfortably, overweight with unsteady gait CV: RRR with no murmurs appreciated Pulm: NWOB, CTAB with no crackles, wheezes, or rhonchi MSK: no edema, cyanosis, or clubbing noted Skin: warm, dry Neuro: grossly normal, moves all extremities Psych: Normal affect and thought content  No results found for this or any  previous visit (from the past 72 hour(s)).   Assessment/Plan:  Fall C/o multiple falls due to weakness of legs.  Claims recent imaging at [facility] ruled out fractures of hip/leg.  Thinks prgressive weakness has sped up since fall last month along with increase in chronic incontinence, denies saddle anesthesia or IV drug use.  Denies sick symptoms.  Nuero exam show no gross deficit but patient has unsteady gait.  Firmly refuses to consider that 4wheeled walker may be a poor choice for her and she gives inconsistent hx of falling forward or backward.  No midline tenderness or radiculopathy.  Last visible lumbar imaging was 2014 and negative  XR lumbar/pelvis neg ordered this visit also neg, ordered MRI  Weakness of both legs C/o multiple falls due to weakness of legs.  Claims recent imaging at [facility] ruled out fractures of hip/leg.  Thinks prgressive weakness has sped up since fall last month along with increase in chronic incontinence, denies saddle anesthesia or IV drug use.  Denies sick symptoms.  Nuero exam show no gross deficit but patient has unsteady gait.  Firmly refuses to consider that 4wheeled walker may be a poor choice for her and she gives inconsistent hx of falling forward or backward.  No midline tenderness or radiculopathy.  Last visible lumbar imaging was 2014 and negative  XR lumbar/pelvis neg ordered this visit also neg, ordered MRI  Sherene Sires, DO FAMILY MEDICINE RESIDENT - PGY2 01/20/2019 10:23 AM

## 2019-01-20 ENCOUNTER — Telehealth: Payer: Self-pay | Admitting: *Deleted

## 2019-01-20 NOTE — Telephone Encounter (Signed)
Contacted [facility] and they will reach out to pt to schedule the MRI.  Spoke with Prentiss Bells at that office. April Zimmerman Rumple, CMA

## 2019-01-20 NOTE — Telephone Encounter (Signed)
-----   Message from Sherene Sires, DO sent at 01/20/2019  9:43 AM EDT ----- Please assist with scheduling MRI- Dr. Criss Rosales

## 2019-01-20 NOTE — Assessment & Plan Note (Signed)
C/o multiple falls due to weakness of legs.  Claims recent imaging at [facility] ruled out fractures of hip/leg.  Thinks prgressive weakness has sped up since fall last month along with increase in chronic incontinence, denies saddle anesthesia or IV drug use.  Denies sick symptoms.  Nuero exam show no gross deficit but patient has unsteady gait.  Firmly refuses to consider that 4wheeled walker may be a poor choice for her and she gives inconsistent hx of falling forward or backward.  No midline tenderness or radiculopathy.  Last visible lumbar imaging was 2014 and negative  XR lumbar/pelvis neg ordered this visit also neg, ordered MRI

## 2019-01-24 ENCOUNTER — Other Ambulatory Visit: Payer: Self-pay | Admitting: Family Medicine

## 2019-01-25 ENCOUNTER — Telehealth: Payer: Self-pay | Admitting: *Deleted

## 2019-01-25 ENCOUNTER — Other Ambulatory Visit: Payer: Self-pay

## 2019-01-25 ENCOUNTER — Telehealth (INDEPENDENT_AMBULATORY_CARE_PROVIDER_SITE_OTHER): Payer: Medicaid Other | Admitting: Family Medicine

## 2019-01-25 VITALS — Temp 98.4°F

## 2019-01-25 DIAGNOSIS — Z20822 Contact with and (suspected) exposure to covid-19: Secondary | ICD-10-CM

## 2019-01-25 DIAGNOSIS — Z20828 Contact with and (suspected) exposure to other viral communicable diseases: Secondary | ICD-10-CM

## 2019-01-25 NOTE — Telephone Encounter (Signed)
I called pt to schedule her for COVID-19 testing.   Left a voicemail to call us back at [phone] and any nurse can assist her in getting the COVID-19 test scheduled.

## 2019-01-25 NOTE — Telephone Encounter (Signed)
Contacted pt to schedule COVID testing; she states that her son's test came back negative so she does not need to have testing done; will route to provider for notification.

## 2019-01-25 NOTE — Progress Notes (Addendum)
[facility] Telemedicine Visit  Patient consented to have virtual visit. Method of visit: Telephone  Encounter participants: Patient: [name] - located at home  Provider: Lovenia Kim - located at office Others (if applicable): N/A  Chief Complaint: cough  HPI: Patient reports that her son went to the hospital yesterday with chest pain and back aches and was told he may have COVID-19.  He underwent testing but has not yet received his results.  She reports a cough productive of sputum which began about a week ago.  She thinks she has bronchitis.  No fever, chills, muscle aches.  She reports some underlying SOB at baseline that is not new for her but is not worse than her usual. Denies sore throat, runny nose or congestion. No loss of sense of smell or taste.  Lives a block away from her son but was in contact with him yesterday and is concerned she may have been exposed.   ROS: per HPI  Pertinent PMHx: COPD  Exam:  General: pleasant 63 yo female Respiratory: NAD, normal effort and able to speak in full sentences   Assessment/Plan:  No problem-specific Assessment & Plan notes found for this encounter.   Recent contact with son who is undergoing testing for COVID-19. She denies all symptoms except cough with sputum production which she feels may be her chronic bronchitis.   Would be reasonable to test given h/o underlying lung disease with COPD.   Staff message sent for COVID-19 drive-up testing.   Counseled on wearing a mask and avoiding social gatherings  ED precautions discussed and she expresses good understanding  Time spent during visit with patient: 15 minutes

## 2019-01-25 NOTE — Telephone Encounter (Signed)
Called pt a 2nd time.  Left voicemail for her to return the call to schedule her COVID-19  Test.   [phone] and any nurse could schedule the test.

## 2019-02-08 ENCOUNTER — Other Ambulatory Visit: Payer: Self-pay

## 2019-02-08 ENCOUNTER — Telehealth (INDEPENDENT_AMBULATORY_CARE_PROVIDER_SITE_OTHER): Payer: Medicaid Other | Admitting: Family Medicine

## 2019-02-08 DIAGNOSIS — J441 Chronic obstructive pulmonary disease with (acute) exacerbation: Secondary | ICD-10-CM | POA: Diagnosis not present

## 2019-02-08 MED ORDER — PREDNISONE 20 MG PO TABS: 60.0000 mg | ORAL_TABLET | Freq: Every day | ORAL | 0 refills | Status: AC

## 2019-02-08 NOTE — Assessment & Plan Note (Addendum)
Acute exacerbation, with productive cough for 2 weeks. Wheezing on despite using inhalers and albuterol nebs TID. Denies fevers or covid 19 contacts, son recently tested negative, patient never went for her testing, but has been social distancing. Will treat with steroid burst. No abx at this time given anaphylaxis to most abx.  - Prednisone 60mg  qd x5d - Return precautions given, patient voiced understanding - counseled on importance of smoking cessation - Continue Albuterol prn, and Dulera

## 2019-02-08 NOTE — Progress Notes (Signed)
[facility] Telemedicine Visit  Patient consented to have virtual visit. Method of visit: Telephone  Encounter participants: Patient: [name] - located at home Provider: [place] Merrit Waugh - located at home Others (if applicable): n/a  Chief Complaint: bronchitis flare  HPI: Patient with cough for 2 weeks. She has been using albuterol three times per day. Reports no fevers, chills, body aches, known contacts to covid 19. She reports that this is a productive cough. She has had some shortness of breath, that she reports is her baseline. On 6/16 was to have covid 19 test, but she never had covid testing performed, as her son tested negative.  She is not on any oxygen at home. Patient smokes 1ppd. She says she has recurrent bronchitis and has COPD. She usually gets better with prednisone as she has many allergies to abx and cannot use these. She states she has an MRI on 7/7 and would like to be better before the MRI. Last time she was treated was a month to 2 ago.   ROS: per HPI  Pertinent PMHx: COPD  Exam:  Respiratory: coughing noted throughout visit, able to speak in complete sentences without signs of trouble  Assessment/Plan:  COPD exacerbation (HCC) Acute exacerbation, with productive cough for 2 weeks. Wheezing on despite using inhalers and albuterol nebs TID. Denies fevers or covid 19 contacts, son recently tested negative, patient never went for her testing, but has been social distancing. Will treat with steroid burst. No abx at this time given anaphylaxis to most abx.  - Prednisone 60mg  qd x5d - Return precautions given, patient voiced understanding - counseled on importance of smoking cessation - Continue Albuterol prn, and Dulera     Time spent during visit with patient: 12 minutes

## 2019-02-14 ENCOUNTER — Other Ambulatory Visit: Payer: Self-pay | Admitting: Family Medicine

## 2019-02-14 ENCOUNTER — Other Ambulatory Visit: Payer: Self-pay | Admitting: Neurology

## 2019-02-15 ENCOUNTER — Other Ambulatory Visit: Payer: Self-pay

## 2019-02-15 ENCOUNTER — Ambulatory Visit
Admission: RE | Admit: 2019-02-15 | Discharge: 2019-02-15 | Disposition: A | Discharge status: Home / Self Care | Payer: Medicaid Other | Source: Ambulatory Visit | Attending: Family Medicine | Admitting: Family Medicine

## 2019-02-15 ENCOUNTER — Telehealth: Payer: Self-pay | Admitting: Neurology

## 2019-02-15 DIAGNOSIS — M5126 Other intervertebral disc displacement, lumbar region: Secondary | ICD-10-CM | POA: Diagnosis not present

## 2019-02-15 DIAGNOSIS — W19XXXA Unspecified fall, initial encounter: Secondary | ICD-10-CM

## 2019-02-15 DIAGNOSIS — R29898 Other symptoms and signs involving the musculoskeletal system: Secondary | ICD-10-CM

## 2019-02-15 MED ORDER — LAMOTRIGINE 200 MG PO TABS: ORAL_TABLET | ORAL | 0 refills | Status: DC

## 2019-02-15 MED ORDER — GADOBENATE DIMEGLUMINE 529 MG/ML IV SOLN
18.0000 mL | Freq: Once | INTRAVENOUS | Status: AC | PRN
  Administered 2019-02-15: 18 mL via INTRAVENOUS

## 2019-02-15 NOTE — Telephone Encounter (Signed)
Pt states she went to refill her lamoTRIgine (LAMICTAL) 200 MG tablet and was told the pharmacy tried running it twice and she was then told to call her Dr to find out why her refill request has been denied, please call.

## 2019-02-15 NOTE — Addendum Note (Signed)
Addended by: Brandon Melnick on: 02/15/2019 03:39 PM   Modules accepted: Orders

## 2019-02-15 NOTE — Telephone Encounter (Signed)
I called pt, she had not had lab work done from last visit.  I relayed to come in and have labs and then can see 03-29-19.  She wanted to wait. I moved her appt earlier in day 0845 be here 0815 to check in on 03-01-19.  Will get her labs (trough) then she can take her medication (she will bring with her) after being seen.  She was ok with this.  I refilled her lamotrigine for 30days.

## 2019-02-16 ENCOUNTER — Telehealth: Payer: Self-pay | Admitting: Family Medicine

## 2019-02-16 DIAGNOSIS — R202 Paresthesia of skin: Secondary | ICD-10-CM

## 2019-02-16 NOTE — Telephone Encounter (Signed)
Test was ordered by Dr. Criss Rosales. Will forward to Dr. Criss Rosales to call patient and discuss results.   Dalphine Handing, PGY-3 [facility] 02/16/2019 12:39 PM

## 2019-02-16 NOTE — Telephone Encounter (Signed)
Pt is calling to check on the status of her results from the MRI done yesterday 07/07.   Please call pt when results are available.

## 2019-02-16 NOTE — Telephone Encounter (Signed)
Patient coming complaining of increased weakness in legs intermittently, initial x-ray showed some anterolisthesis but MRI did not show any cord compression.  On phone call to discuss results of MRI, patient said that she has paresthesias for approximately 10 minutes in the morning when she wakes up.  Patient says she follows with [facility] for [facility]opedics and that they have already done surgeries for her hip, she would like to see them for the next step of work-up for this problem.  Urgent referral placed Dr. Criss Rosales

## 2019-02-24 DIAGNOSIS — M25511 Pain in right shoulder: Secondary | ICD-10-CM | POA: Diagnosis not present

## 2019-02-25 ENCOUNTER — Telehealth (INDEPENDENT_AMBULATORY_CARE_PROVIDER_SITE_OTHER): Payer: Medicaid Other | Admitting: Family Medicine

## 2019-02-25 ENCOUNTER — Other Ambulatory Visit: Payer: Self-pay

## 2019-02-25 DIAGNOSIS — M25511 Pain in right shoulder: Secondary | ICD-10-CM | POA: Diagnosis not present

## 2019-02-25 MED ORDER — MELOXICAM 15 MG PO TABS: 15.0000 mg | ORAL_TABLET | Freq: Every day | ORAL | 0 refills | Status: DC

## 2019-02-25 NOTE — Assessment & Plan Note (Signed)
Counseled patient that for long-term alleviation of her right shoulder pain, she will need to perform exercises to strengthen her shoulder muscles and relieve some of the pressure from the bony articulation of her shoulder joint.  Since arthritis is a long-term problem, I would not want to prescribe her a medication that she would need to take long-term.  Furthermore, since she has COPD and seizure activity, narcotics and tramadol are both relatively contraindicated.  She may also benefit from evaluation via ultrasound and assessment by sports medicine, so we will make that referral today.  Offered physical therapy, but patient says that her Medicaid would not pay for this.  Patient was agreeable to a trial of meloxicam 15 mg once daily for 30 days.  Hopefully this will alleviate her pain enough to allow her to do her shoulder strengthening exercises.

## 2019-02-25 NOTE — Progress Notes (Signed)
[facility] Telemedicine Visit  Patient consented to have virtual visit. Method of visit: Telephone  Encounter participants: Patient: [name] - located at home Provider: Kathrene Alu - located at [facility] Others (if applicable): none  Chief Complaint: Right shoulder pain  HPI:  Right arm began hurting two weeks ago, pain is getting worse Describes pain as throbbing and has worsened since it started Lifting and straightening R arm exacerbates pain Is having trouble sleeping due to her pain Has tried heat and ice as well as ibuprofen and tylenol but nothing has helped Saw [facility] yesterday, 7/16, and received a corticosteroid shot, which did not seem to help 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 that she has right shoulder arthritis Was given shoulder strengthening exercises by the orthopedist, but she has been unable to perform them due to pain Would like pain medication for her right shoulder pain Denies numbness, tingling, pins-and-needles type pain, and weakness of the right arm  ROS: per HPI  Pertinent PMHx: COPD, seizure disorder, left shoulder pain, arthritis of the knee, diastolic CHF  Exam:  Respiratory: Speaks in complete sentences without shortness of breath, occasional cough  Assessment/Plan:  Right shoulder pain Counseled patient that for long-term alleviation of her right shoulder pain, she will need to perform exercises to strengthen her shoulder muscles and relieve some of the pressure from the bony articulation of her shoulder joint.  Since arthritis is a long-term problem, I would not want to prescribe her a medication that she would need to take long-term.  Furthermore, since she has COPD and seizure activity, narcotics and tramadol are both relatively contraindicated.  She may also benefit from evaluation via ultrasound and assessment by sports medicine, so we will make that referral today.  Offered physical therapy, but patient says that  her Medicaid would not pay for this.  Patient was agreeable to a trial of meloxicam 15 mg once daily for 30 days.  Hopefully this will alleviate her pain enough to allow her to do her shoulder strengthening exercises.    Time spent during visit with patient: 7 minutes

## 2019-03-01 ENCOUNTER — Ambulatory Visit: Payer: Self-pay | Admitting: Neurology

## 2019-03-08 ENCOUNTER — Other Ambulatory Visit: Payer: Self-pay

## 2019-03-08 ENCOUNTER — Ambulatory Visit: Payer: Medicaid Other | Admitting: Sports Medicine

## 2019-03-14 ENCOUNTER — Other Ambulatory Visit: Payer: Self-pay | Admitting: Family Medicine

## 2019-03-14 ENCOUNTER — Other Ambulatory Visit: Payer: Self-pay | Admitting: Neurology

## 2019-03-15 ENCOUNTER — Ambulatory Visit: Payer: Medicaid Other | Admitting: Sports Medicine

## 2019-03-15 ENCOUNTER — Ambulatory Visit: Payer: Self-pay | Admitting: Neurology

## 2019-03-24 ENCOUNTER — Ambulatory Visit: Payer: Medicaid Other | Admitting: Sports Medicine

## 2019-03-29 ENCOUNTER — Ambulatory Visit: Payer: Medicaid Other | Admitting: Neurology

## 2019-04-04 NOTE — Progress Notes (Signed)
PATIENT: [name] DOB: Jul 22, 1956  REASON FOR VISIT: follow up HISTORY FROM: patient  HISTORY OF PRESENT ILLNESS: Today 04/05/19  [name] is a 63 year old female with history of seizures and migraine headaches.  She reports she continues to have staring spells since at least August 2019.  She says it may happen 1-2 times a month.  They are witnessed by her grandchildren.  She says during the spells she can hear, but she is not able to respond. Lasting 5 to 10 seconds.  She returns back to normal.  She says she will have a headache after the event.  She continues taking Ajovy for migraine headaches.  She reports the injection will work well for 2 weeks, but will then wear off and her headaches are daily.  In the past she has tried Botox for headaches, but it was only 30% effective.  She is taking Seroquel at bedtime for sleep.  She said it helps her fall asleep, but she still wakes up during the night.  Since taking medication, she has gained 15 pounds.  She has tried trazodone in the past, but it caused a lot of weight gain.  She does use a walker for ambulation, for the last few years she has had weakness in her legs and poor balance.  In the past her Lamictal level was low, the staring episodes decreased with increase medication.  She feels on average she may have 10-20 migraines a month.  She does not drive a car.  She lives with her family.  She presents today for follow-up unaccompanied.  HISTORY 11/23/2018 SS: [name] is a 63 year old female with history of seizures and migraine headache. With her seizures, she will staring off events. At her last visit Seroquel was increased to 100 mg at night. She complained of a new issue of vertigo, she was sent to physical therapy for vestibular rehabilitation for probable benign positional vertigo. She was started on Ajovy. Her Lamictal level was low and was increased to taking 200 mg in the morning, 300 mg in the evening. She had a decline in her  kidney function and was to be followed by her primary care provider. In the past she has tried Botox for her migraines and it was beneficial.   Patient reports her headaches are better with the Ajovy.  She reports for 2 weeks she will have no headache, after 2 weeks her headaches may start to return.  In the past she has had daily headaches.  She reports she continues to have staring off spells, happening about twice a month.  She reports no change since her Lamictal has been increased.  In the past she has had grand mal seizures, she has not had one in some time.  During her staring off episodes she will hear things, but she cannot respond.  The episodes last no longer than 10 minutes, once they subside she is back to baseline.  There is no incontinence during the episode.  The increase in her Seroquel was helpful to her sleep.  Her dizziness has improved, occasionally will have a dizzy spell.  She reports she was never contacted by physical therapy for vestibular rehabilitation.  At this time she does not think she needs this.  She does not drive, she does not have a driver's license.  She does follow-up with primary care, her creatinine was rechecked, was better.  She manages her medications, she denies any problems.  She is also taking  Topamax 100 mg, 1 tablet in the morning, 2 tablets at bedtime.  04/02/2018 Dr. [name]: [name] a 63 year old right-handed white female with a history of intractable migraine headache and a history of seizures. The patient comes into the office today with several new complaints. She had been getting Botox injections through the rehab department, but she is no longer doing this as she apparently missed 3 appointments. The patient indicates that she is having about 3-4 headaches a week. Over the last 2 months she has not been sleeping well, she will go to sleep at her usual time but then wake up around 2 or 3 in the morning and cannot get back to sleep. She has had  several events of staring off, one lasting up to 10 minutes. She can hear what people are saying during the events, but she cannot respond. The patient has not been missing any doses of her Topamax or Lamictal. The patient also within the last month has developed positional vertigo. She generally feels well when she is up and doing things, when she lies down and rolls on her right side the vertigo comes on. The patient does not get nausea or vomiting with this. When she gets to sleep it does not wake her up. The patient returns to this office for an evaluation.  REVIEW OF SYSTEMS: Out of a complete 14 system review of symptoms, the patient complains only of the following symptoms, and all other reviewed systems are negative.  Dizziness, headache, speech difficulty, weakness  ALLERGIES: Allergies  Allergen Reactions  . Amoxicillin Anaphylaxis    Breakout, mouth swells  . Azithromycin Swelling and Rash    "swelling all over, high, thrush"  . Cefdinir     Throat swelling after 3rd dose  . Chantix [Varenicline] Other (See Comments)    "bad dreams, difficulty breathing"  . Doxycycline Hyclate Anaphylaxis and Rash    swelling  . Levofloxacin Anaphylaxis  . Penicillins Anaphylaxis, Shortness Of Breath and Rash    "ALMOST DIED, ended up in hospital"  PCN reaction causing immediate rash, facial/tongue/throat swelling, SOB or lightheadedness with hypotension: YES PCN reaction causing severe rash involving mucus membranes or skin necrosis: NO PCN reaction that required hospitalization YES Has patient had a PCN reaction occurring within the last 10 years: NO   . Sulfonamide Derivatives Shortness Of Breath, Swelling and Rash    "break out from head to toe"  . Toradol [Ketorolac Tromethamine] Other (See Comments)    Seizure   . Clarithromycin Hives    swelling  . Nortriptyline Swelling, Rash and Other (See Comments)    Per patient had "kidney problems" on this medication, could not use  bathroom (urinary retention)  . Codeine Nausea And Vomiting  . Fluticasone-Salmeterol Other (See Comments)    REACTION: ulcers in mouth  . Triptans Rash    Mouth breaks out and start itching    HOME MEDICATIONS: Outpatient Medications Prior to Visit  Medication Sig Dispense Refill  . acetaminophen (TYLENOL) 500 MG tablet Take 1,000 mg by mouth every other day.    . albuterol (PROVENTIL) (2.5 MG/3ML) 0.083% nebulizer solution USE 1 VIAL IN NEBULIZER EVERY 6 HOURS AS NEEDED FOR SHORTNESS OF BREATH 75 mL 2  . albuterol (VENTOLIN HFA) 108 (90 Base) MCG/ACT inhaler INHALE 2 PUFFS INTO THE LUNGS EVERY 6 HOURS AS NEEDED FOR SHORTNESS OF BREATH 8.5 g 2  . CALCITRATE 950 MG tablet TAKE 1 TABLET BY MOUTH TWO TIMES DAILY 60 tablet 2  . Cholecalciferol (VITAMIN  D3) 50 MCG (2000 UT) capsule TAKE ONE CAPSULE BY MOUTH DAILY 30 capsule 2  . diclofenac sodium (VOLTAREN) 1 % GEL APPLY 2 GRAMS TOPICALLY TO THE AFFECTED AREA 2 TIMES A DAY AS DIRECTED 100 g 0  . docusate sodium (COLACE) 100 MG capsule TAKE 1 CAPSULE BY MOUTH 2 TIMES DAILY AS NEEDED FOR MILD CONSTIPATION. 30 capsule 0  . DULERA 200-5 MCG/ACT AERO INHALE 2 PUFFS INTO THE LUNGS TWO TIMES DAILY 13 g 0  . ferrous sulfate 325 (65 FE) MG tablet Take 1 tablet (325 mg total) by mouth 2 (two) times daily with a meal. (Patient taking differently: Take 325 mg by mouth daily with breakfast. ) 60 tablet 3  . fluticasone (FLONASE) 50 MCG/ACT nasal spray SPRAY 2 SPRAYS INTO EACH NOSTRIL DAILY AS NEEDED FOR CONGESTION. 16 g 2  . Fremanezumab-vfrm (AJOVY) 225 MG/1.5ML SOSY Inject 225 mg into the skin every 30 (thirty) days. 1.5 mL 5  . guaiFENesin (MUCINEX) 600 MG 12 hr tablet Take 2 tablets (1,200 mg total) by mouth 2 (two) times daily as needed. 30 tablet 0  . lamoTRIgine (LAMICTAL) 200 MG tablet TAKE ONE TABLET BY MOUTH EVERY MORNING AND ONE AND A HALF TABLETS IN THE EVENING 75 tablet 0  . loratadine (CLARITIN) 10 MG tablet TAKE 1 TABLET BY MOUTH EVERY DAY 30  tablet 2  . meloxicam (MOBIC) 15 MG tablet Take 1 tablet (15 mg total) by mouth daily. 90 tablet 1  . meloxicam (MOBIC) 15 MG tablet Take 1 tablet (15 mg total) by mouth daily. 30 tablet 0  . NON FORMULARY Use CPAP at bedtime    . omeprazole (PRILOSEC) 40 MG capsule TAKE ONE CAPSULE BY MOUTH 2 TIMES A DAY 60 capsule 2  . polyethylene glycol powder (GLYCOLAX/MIRALAX) powder MIX 17 GMS IN LIQUID AND DRINK ONCE DAILY 527 g 0  . QUEtiapine (SEROQUEL) 100 MG tablet Take 1 tablet (100 mg total) by mouth at bedtime. 90 tablet 1  . sertraline (ZOLOFT) 50 MG tablet TAKE ONE TABLET BY MOUTH DAILY 30 tablet 2  . simvastatin (ZOCOR) 40 MG tablet TAKE 1 TABLET BY MOUTH AT BEDTIME 30 tablet 2  . topiramate (TOPAMAX) 100 MG tablet TAKE ONE TABLET BY MOUTH EVERY MORNING AND 2 TABLETS AT BEDTIME - MUST KEEP APPOINTMENT 04/02/18 for refills 270 tablet 3   Facility-Administered Medications Prior to Visit  Medication Dose Route Frequency Provider Last Rate Last Dose  . capsicum (ZOSTRIX) 0.075 % cream   Topical BID Benay Pike, MD        PAST MEDICAL HISTORY: Past Medical History:  Diagnosis Date  . Allergic rhinitis   . Arthritis    "hands and legs and feet" (04/27/2012)  . Cancer ([place]) 05   rt arm mole rem cancer  . Chronic bronchitis ([place])    "keep it all the time" (04/27/2012)  . COPD (chronic obstructive pulmonary disease) ([place])   . Depression   . Diverticulosis   . DJD (degenerative joint disease)   . Emphysema   . Epilepsy ([place])   . Esophageal stricture   . Exertional dyspnea   . GERD (gastroesophageal reflux disease)   . Glaucoma   . H/O blood clots    Right leg  . Hyperlipidemia   . Hypertension   . IBS (irritable bowel syndrome)   . Migraine   . Neck pain    Cervical disc  . OSA on CPAP    6-7 yrs; pt wears CPAP but has not  been able to use it lately due to sinus issues  . Pneumonia 2012; 04/27/2012  . Right leg DVT ([place]) 1982   groin  . Seizures ([place])   . Type II diabetes  mellitus ([place])    No meds since weight loss  . UTI (urinary tract infection)    2 weeks ago  . Vertigo 10/24/2014    PAST SURGICAL HISTORY: Past Surgical History:  Procedure Laterality Date  . ABDOMINAL HYSTERECTOMY  2001   "partial"  . CARPAL TUNNEL RELEASE  ~ 2008   right  . CATARACT EXTRACTION Bilateral   . COLONOSCOPY WITH ESOPHAGOGASTRODUODENOSCOPY (EGD)    . KNEE ARTHROSCOPY  1990's   left  . NASAL SEPTOPLASTY W/ TURBINOPLASTY Bilateral 05/23/2016   Procedure: NASAL SEPTOPLASTY WITH TURBINATE REDUCTION;  Surgeon: Jerrell Belfast, MD;  Location: [facility];  Service: ENT;  Laterality: Bilateral;  . SHOULDER OPEN ROTATOR CUFF REPAIR  ~ 2010   left  . SINUS ENDO WITH FUSION Right 05/23/2016   Procedure: LIMITED RIGHT ENDOSCOPIC SINUS SURGERY;  Surgeon: Jerrell Belfast, MD;  Location: [facility];  Service: ENT;  Laterality: Right;  . TOTAL KNEE ARTHROPLASTY  08/23/2012   right  . TOTAL KNEE ARTHROPLASTY  08/23/2012   Procedure: TOTAL KNEE ARTHROPLASTY;  Surgeon: Lorn Junes, MD;  Location: [facility];  Service: Orthopedics;  Laterality: Right;  RIGHT KNEE ARTHROPLASTY MEDIAL AND LATERAL COMPARTMENTS WITH PATELLA RESURFACING  . TUBAL LIGATION  2001    FAMILY HISTORY: Family History  Problem Relation Age of Onset  . Alcohol abuse Father   . Lung cancer Father        was a smoker  . Emphysema Father        was a smoker  . Heart disease Sister   . Lung cancer Mother        was a smoker  . Stroke Mother   . Other Sister        blood clotting disorder  . Seizures Brother   . Heart disease Maternal Grandfather   . Diabetes Maternal Grandfather   . Heart disease Unknown        uncle  . Diabetes Unknown        uncles/aunts  . Breast cancer Maternal Aunt   . Bipolar disorder Son   . Muscular dystrophy Son   . Heart disease Son   . Colon cancer Neg Hx     SOCIAL HISTORY: Social History   Socioeconomic History  . Marital status: Married    Spouse name: [name]  . Number of  children: 2  . Years of education: 20  . Highest education level: Not on file  Occupational History  . Occupation: disabled  Social Needs  . Financial resource strain: Not on file  . Food insecurity    Worry: Not on file    Inability: Not on file  . Transportation needs    Medical: Not on file    Non-medical: Not on file  Tobacco Use  . Smoking status: Current Every Day Smoker    Packs/day: 1.00    Years: 40.00    Pack years: 40.00    Types: Cigarettes    Start date: 08/12/1975  . Smokeless tobacco: Former Systems developer    Quit date: 08/23/2012  . Tobacco comment: started back smoking in 11/2012  Substance and Sexual Activity  . Alcohol use: No    Alcohol/week: 0.0 standard drinks  . Drug use: No  . Sexual activity: Never  Lifestyle  . Physical  activity    Days per week: Not on file    Minutes per session: Not on file  . Stress: Not on file  Relationships  . Social Herbalist on phone: Not on file    Gets together: Not on file    Attends religious service: Not on file    Active member of club or organization: Not on file    Attends meetings of clubs or organizations: Not on file    Relationship status: Not on file  . Intimate partner violence    Fear of current or ex partner: Not on file    Emotionally abused: Not on file    Physically abused: Not on file    Forced sexual activity: Not on file  Other Topics Concern  . Not on file  Social History Narrative   Patient lives at home with husband [name], her son and his wife.    Patient has 2 children.    Patient has a high school education.    Patient is currently unemployed.    Patient is right handed.    Patient drinks 2 cups of caffeine per day.      PHYSICAL EXAM  Vitals:   04/05/19 1348  BP: (!) 141/85  Pulse: 98  Temp: 98.2 F (36.8 C)  TempSrc: Oral  Weight: 208 lb (94.3 kg)  Height: 5\' 1"  (1.549 m)   Body mass index is 39.3 kg/m.  Generalized: Well developed, in no acute distress    Neurological examination  Mentation: Alert oriented to time, place, history taking. Follows all commands speech and language fluent Cranial nerve II-XII: Pupils were equal round reactive to light. Extraocular movements were full, visual field were full on confrontational test. Facial sensation and strength were normal. Head turning and shoulder shrug  were normal and symmetric. Motor: The motor testing reveals 5 over 5 strength of all 4 extremities. Good symmetric motor tone is noted throughout.  Sensory: Sensory testing is intact to soft touch on all 4 extremities. No evidence of extinction is noted.  Coordination: Cerebellar testing reveals good finger-nose-finger and heel-to-shin bilaterally.  Gait and station: Gait is slow, wide based, using a walker. Reflexes: Deep tendon reflexes are symmetric in upper extremities, did not want lower extremities assessed  DIAGNOSTIC DATA (LABS, IMAGING, TESTING) - I reviewed patient records, labs, notes, testing and imaging myself where available.  Lab Results  Component Value Date   WBC 7.9 04/02/2018   HGB 11.5 04/02/2018   HCT 34.7 04/02/2018   MCV 94 04/02/2018   PLT 209 04/02/2018      Component Value Date/Time   NA 141 06/01/2018 1555   K 4.5 06/01/2018 1555   CL 104 06/01/2018 1555   CO2 23 06/01/2018 1555   GLUCOSE 82 06/01/2018 1555   GLUCOSE 82 06/13/2016 1441   BUN 18 06/01/2018 1555   CREATININE 1.09 (H) 06/01/2018 1555   CREATININE 0.91 06/13/2016 1441   CALCIUM 9.1 06/01/2018 1555   PROT 7.4 04/02/2018 1240   ALBUMIN 4.2 04/02/2018 1240   AST 12 04/02/2018 1240   ALT 6 04/02/2018 1240   ALKPHOS 108 04/02/2018 1240   BILITOT <0.2 04/02/2018 1240   GFRNONAA 55 (L) 06/01/2018 1555   GFRNONAA 70 06/13/2016 1441   GFRAA 64 06/01/2018 1555   GFRAA 80 06/13/2016 1441   Lab Results  Component Value Date   CHOL 148 06/19/2014   HDL 52 06/19/2014   LDLCALC 61 06/19/2014   LDLDIRECT 77 09/14/2012  TRIG 176 (H) 06/19/2014    CHOLHDL 2.8 06/19/2014   Lab Results  Component Value Date   HGBA1C 5.5 01/12/2017   Lab Results  Component Value Date   VITAMINB12 414 01/21/2008   Lab Results  Component Value Date   TSH 2.314 06/19/2014      ASSESSMENT AND PLAN 63 y.o. year old female  has a past medical history of Allergic rhinitis, Arthritis, Cancer ([place]) (05), Chronic bronchitis ([place]), COPD (chronic obstructive pulmonary disease) ([place]), Depression, Diverticulosis, DJD (degenerative joint disease), Emphysema, Epilepsy ([place]), Esophageal stricture, Exertional dyspnea, GERD (gastroesophageal reflux disease), Glaucoma, H/O blood clots, Hyperlipidemia, Hypertension, IBS (irritable bowel syndrome), Migraine, Neck pain, OSA on CPAP, Pneumonia (2012; 04/27/2012), Right leg DVT ([place]) (1982), Seizures ([place]), Type II diabetes mellitus ([place]), UTI (urinary tract infection), and Vertigo (10/24/2014). here with:  1.  Intractable migraine headache -She reports good benefit with Ajovy, but after 2 weeks, the medication wears off and she has daily headache -We will try to switch to Aimovig 70 mg injection every 2 weeks, she does not have a latex allergy -She says on average she is having 10-20 migraines a month, will take Advil or Tylenol -If this is not beneficial, we will restart her Botox, she has tried this in the past -She remains on Topamax 100 mg in the morning, 200 mg in the evening (dual for migraine and seizures)  2.  History of seizures -She reports continued episodes of staring off, 1-2 a month, no grand mal seizures -I will check lab work today, including a Lamictal level -She will continue Lamictal 200 mg tablet, 1 tablet in the morning, 1.5 tablets in the evening -At last visit 04/02/2018, her Lamictal level was low 1.0, her dose was increased, she says her staring episodes decreased in frequency -She does not drive a car  3.  Insomnia -She is taking Seroquel 100 mg at bedtime, she would like to decrease the dose  because she thinks it has caused her to gain weight -She has gained 15 pounds in the last year -If she would like to discontinue, she will take 50 mg at bedtime for a week, then stop the medication, I did not refill today -She may try over-the-counter Benadryl 25 mg tablet, 2 tablets at bedtime for sleep -In the past trazodone resulted in significant weight gain  I spent 25 minutes with the patient. 50% of this time was spent discussing her plan of care.   Butler Denmark, AGNP-C, DNP 04/05/2019, 2:17 PM [facility] [address] [place], [place] [address] [phone]

## 2019-04-05 ENCOUNTER — Other Ambulatory Visit: Payer: Self-pay

## 2019-04-05 ENCOUNTER — Encounter: Payer: Self-pay | Admitting: Neurology

## 2019-04-05 ENCOUNTER — Ambulatory Visit (INDEPENDENT_AMBULATORY_CARE_PROVIDER_SITE_OTHER): Payer: Medicaid Other | Admitting: Neurology

## 2019-04-05 VITALS — BP 141/85 | HR 98 | Temp 98.2°F | Ht 61.0 in | Wt 208.0 lb

## 2019-04-05 DIAGNOSIS — G43711 Chronic migraine without aura, intractable, with status migrainosus: Secondary | ICD-10-CM | POA: Diagnosis not present

## 2019-04-05 DIAGNOSIS — G40909 Epilepsy, unspecified, not intractable, without status epilepticus: Secondary | ICD-10-CM | POA: Diagnosis not present

## 2019-04-05 MED ORDER — AIMOVIG (140 MG DOSE) 70 MG/ML ~~LOC~~ SOAJ: 70.0000 mg | SUBCUTANEOUS | 5 refills | Status: DC

## 2019-04-05 MED ORDER — LAMOTRIGINE 200 MG PO TABS: ORAL_TABLET | ORAL | 5 refills | Status: DC

## 2019-04-05 MED ORDER — TOPIRAMATE 100 MG PO TABS: ORAL_TABLET | ORAL | 3 refills | Status: DC

## 2019-04-05 NOTE — Progress Notes (Signed)
I have read the note, and I agree with the clinical assessment and plan.  Charles K Willis   

## 2019-04-05 NOTE — Patient Instructions (Signed)
1. Continue Ajovy until insurance approval for Aimovig. The injection will need to be 30 days apart. Once aimovig is approved, you will inject 70 mg, then repeat in 14 days 2. Continue Topamax, Lamictal  3. If you want to stop the Seroquel, take 50 mg for 1 week then stop the medication 4. Consider trying Benadryl 25 mg OTC tablets, take 2 at bedtime 5. Consider healthy eating, exercise for weight loss 6. Return in 6 months

## 2019-04-06 ENCOUNTER — Telehealth: Payer: Self-pay | Admitting: Neurology

## 2019-04-06 LAB — COMPREHENSIVE METABOLIC PANEL
ALT: 6 IU/L (ref 0–32)
AST: 9 IU/L (ref 0–40)
Albumin/Globulin Ratio: 1.5 (ref 1.2–2.2)
Albumin: 4.1 g/dL (ref 3.8–4.8)
Alkaline Phosphatase: 135 IU/L — ABNORMAL HIGH (ref 39–117)
BUN/Creatinine Ratio: 15 (ref 12–28)
BUN: 15 mg/dL (ref 8–27)
Bilirubin Total: <0.2 mg/dL (ref 0.0–1.2)
CO2: 21 mmol/L (ref 20–29)
Calcium: 9.2 mg/dL (ref 8.7–10.3)
Chloride: 105 mmol/L (ref 96–106)
Creatinine, Ser: 1.02 mg/dL — ABNORMAL HIGH (ref 0.57–1.00)
GFR calc Af Amer: 69 mL/min/{1.73_m2} (ref >59)
GFR calc non Af Amer: 60 mL/min/{1.73_m2} (ref >59)
Globulin, Total: 2.7 g/dL (ref 1.5–4.5)
Glucose: 96 mg/dL (ref 65–99)
Potassium: 4.3 mmol/L (ref 3.5–5.2)
Sodium: 139 mmol/L (ref 134–144)
Total Protein: 6.8 g/dL (ref 6.0–8.5)

## 2019-04-06 LAB — CBC WITH DIFFERENTIAL/PLATELET
Basophils Absolute: 0.1 10*3/uL (ref 0.0–0.2)
Basos: 1 %
EOS (ABSOLUTE): 0.3 10*3/uL (ref 0.0–0.4)
Eos: 3 %
Hematocrit: 34.2 % (ref 34.0–46.6)
Hemoglobin: 11.8 g/dL (ref 11.1–15.9)
Immature Grans (Abs): 0 10*3/uL (ref 0.0–0.1)
Immature Granulocytes: 0 %
Lymphocytes Absolute: 1.9 10*3/uL (ref 0.7–3.1)
Lymphs: 20 %
MCH: 31.3 pg (ref 26.6–33.0)
MCHC: 34.5 g/dL (ref 31.5–35.7)
MCV: 91 fL (ref 79–97)
Monocytes Absolute: 0.7 10*3/uL (ref 0.1–0.9)
Monocytes: 8 %
Neutrophils Absolute: 6.6 10*3/uL (ref 1.4–7.0)
Neutrophils: 68 %
Platelets: 250 10*3/uL (ref 150–450)
RBC: 3.77 x10E6/uL (ref 3.77–5.28)
RDW: 12.3 % (ref 11.7–15.4)
WBC: 9.7 10*3/uL (ref 3.4–10.8)

## 2019-04-06 LAB — LAMOTRIGINE LEVEL: Lamotrigine Lvl: 3.4 ug/mL (ref 2.0–20.0)

## 2019-04-06 NOTE — Telephone Encounter (Signed)
Nikki @ [facility] is asking for a call from RN to discuss pt's Erenumab-aooe (AIMOVIG, 140 MG DOSE,) 70 MG/ML SOAJ

## 2019-04-06 NOTE — Telephone Encounter (Signed)
I called [name], with [facility].  She stated that pt delivered last ajovy on 03-29-19.  They did run aimovig 70mg /ml thru and did go thru w/o requiring PA.  I would relay to pt.  They will call pt as well to let her know to deliver.  I relayed this to pt and she verbalized understanding.  She will give her aimovig around day of 04-29-19 (30 days post ajovy).  She verbalized understanding.

## 2019-04-07 ENCOUNTER — Telehealth: Payer: Self-pay | Admitting: Neurology

## 2019-04-07 MED ORDER — LAMOTRIGINE 200 MG PO TABS: 300.0000 mg | ORAL_TABLET | Freq: Two times a day (BID) | ORAL | 3 refills | Status: DC

## 2019-04-07 NOTE — Telephone Encounter (Signed)
I called the patient.  The blood work was relatively unremarkable exception of a mild elevation alkaline phosphatase level, not clinically significant.  The lamotrigine level was in the low therapeutic range, the patient still having 1-2 staring episodes a month, we will go up on the dose taking 300 mg twice daily.  A new prescription was sent in.

## 2019-04-11 ENCOUNTER — Other Ambulatory Visit: Payer: Self-pay | Admitting: Family Medicine

## 2019-04-27 ENCOUNTER — Other Ambulatory Visit: Payer: Self-pay | Admitting: Family Medicine

## 2019-05-03 ENCOUNTER — Ambulatory Visit: Payer: Medicaid Other

## 2019-05-11 ENCOUNTER — Ambulatory Visit (INDEPENDENT_AMBULATORY_CARE_PROVIDER_SITE_OTHER): Payer: Medicaid Other | Admitting: *Deleted

## 2019-05-11 ENCOUNTER — Other Ambulatory Visit: Payer: Self-pay

## 2019-05-11 ENCOUNTER — Other Ambulatory Visit: Payer: Self-pay | Admitting: Family Medicine

## 2019-05-11 DIAGNOSIS — Z23 Encounter for immunization: Secondary | ICD-10-CM

## 2019-05-13 ENCOUNTER — Other Ambulatory Visit: Payer: Self-pay

## 2019-05-13 ENCOUNTER — Telehealth (INDEPENDENT_AMBULATORY_CARE_PROVIDER_SITE_OTHER): Payer: Medicaid Other | Admitting: Family Medicine

## 2019-05-13 DIAGNOSIS — J441 Chronic obstructive pulmonary disease with (acute) exacerbation: Secondary | ICD-10-CM

## 2019-05-13 NOTE — Progress Notes (Addendum)
[facility] Telemedicine Visit  Patient consented to have virtual visit. Method of visit: Video was attempted, but technology challenges prevented patient from using video, so visit was conducted via telephone.  Encounter participants: Patient: [name] - located at home Provider: Gerlene Fee - located at home office  Chief Complaint: dyspnea on exertion  HPI: [name] has been experiencing a mix of wet and dry coughing for 4 months now with green pheglm, she also endorses worsening dyspnea on exertion without complete resolution with the use of nebulizers. She was last seen in the office for this same issue 02/08/2019 and was given prednisone that helped but did not completely resolve the symptoms. She says it has progressively gotten worse over the last 4 months and she has gained 20lbs over the same amount of time. She continues to smoke tobacco, 1/2 pack a day and says that is down from 1 pk/day.   She denies recent sick contacts, chest pain, chest tightness, congestion, fever, lower extremity edema, but 20 lb weight gain in 4 months.  We discussed the limitation of a phone visit for these types of symptoms and the option to be seen for a thorough physical exam for proper treatment would be best. [name] agreed.   ROS: per HPI  Pertinent PMHx: Patient Active Problem List   Diagnosis Date Noted  . Right shoulder pain 02/25/2019  . Fall 01/18/2019  . Knee pain, acute 12/06/2018  . Tobacco use disorder 07/02/2018  . Elevated serum creatinine 06/01/2018  . Chronic bronchitis ([place]) 01/14/2017  . Claudication ([place]) 04/09/2016  . Midline low back pain without sciatica 02/26/2016  . Seizure disorder ([place]) 11/12/2015  . 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 Chronic obstructive pulmonary disease ([place]) 04/25/2015  . Seizure ([place])   . Syncope, tussive   . Syncope 08/05/2014  . Pulmonary hypertension ([place])  08/06/2013  . Diastolic CHF ([place]) [id number]  . COPD exacerbation ([place]) 03/29/2013  . BPPV (benign paroxysmal positional vertigo) 03/09/2013  . Chronic respiratory failure ([place]) 11/01/2012  . Left shoulder pain 06/27/2012  . ANXIETY 02/13/2010  . Obstructive sleep apnea 02/19/2009  . SINUSITIS, CHRONIC 08/17/2008  . Vitamin D deficiency 01/21/2008  . OSTEOPENIA 11/24/2007  . Irritable bowel syndrome 11/17/2007  . ALLERGIC RHINITIS 09/24/2007  . COPD GOLD 0  05/12/2007  . Hyperlipidemia 10/08/2006  . OBESITY, NOS  BMI 33.8 10/08/2006  . Cigarette smoker 10/08/2006  . Intractable migraine without aura 10/08/2006  . GLAUCOMA 10/08/2006  . GASTROESOPHAGEAL REFLUX, NO ESOPHAGITIS 10/08/2006  . HERNIA, HIATAL, NONCONGENITAL 10/08/2006  . INCONTINENCE, STRESS, FEMALE 10/08/2006  . DJD (degenerative joint disease) of knee 10/08/2006  . Convulsions ([place]) 10/08/2006     Exam:  General: does not sound like she is acute distress. Age appropriate. Respiratory: sounds of mildly increased effort with wet sounding cough Psych: normal affect   Assessment/Plan:  COPD exacerbation (HCC) COPD v. Bronchitis but certainly could be a mixture, afebrile but with green phelgm. Has an appointment with Dr. Erin Hearing (PCP not available next week) 10/7 @1030am  to be seen for a more thorough physical exam before administering antibiotics as patient has a low tolerance of many different antibiotics. -Continue Dulera, and albuterol PRN -Smoking cessation discussed -Precaution symptom monitoring for worsening respiratory status discussed to present to ED  -In office visit 10/7 1030am    Time spent during visit with patient: 20 minutes  Gerlene Fee, [facility] PGY-1

## 2019-05-13 NOTE — Assessment & Plan Note (Addendum)
COPD v. Bronchitis but certainly could be a mixture, afebrile but with green phelgm. Has an appointment with Dr. Erin Hearing (PCP not available next week) 10/7 @1030am  to be seen for a more thorough physical exam before administering antibiotics as patient has a low tolerance of many different antibiotics. -Continue Dulera, and albuterol PRN -Smoking cessation discussed -Precaution symptom monitoring for worsening respiratory status discussed to present to ED  -In office visit 10/7 1030am

## 2019-05-18 ENCOUNTER — Ambulatory Visit: Payer: Medicaid Other | Admitting: Family Medicine

## 2019-05-24 ENCOUNTER — Ambulatory Visit: Payer: Medicaid Other | Admitting: Family Medicine

## 2019-05-31 ENCOUNTER — Ambulatory Visit: Payer: Medicaid Other | Admitting: Family Medicine

## 2019-06-01 DIAGNOSIS — G4733 Obstructive sleep apnea (adult) (pediatric): Secondary | ICD-10-CM | POA: Diagnosis not present

## 2019-06-22 ENCOUNTER — Telehealth: Payer: Self-pay | Admitting: Family Medicine

## 2019-06-22 ENCOUNTER — Ambulatory Visit: Payer: Medicaid Other | Admitting: Family Medicine

## 2019-06-22 NOTE — Telephone Encounter (Signed)
Please schedule patient for telehealth visit. We will need more info to determine best treatment course and to ensure it is lice vs. Other insect.   Dalphine Handing, PGY-3 [facility] 06/22/2019 11:54 AM

## 2019-06-22 NOTE — Telephone Encounter (Signed)
Patient has called to see if she could get something prescribed her lice. Please give pt a call back.

## 2019-06-23 NOTE — Telephone Encounter (Signed)
Pt already scheduled. Deseree Blount, CMA  

## 2019-06-24 ENCOUNTER — Other Ambulatory Visit: Payer: Self-pay | Admitting: Neurology

## 2019-06-28 ENCOUNTER — Ambulatory Visit: Payer: Medicaid Other | Admitting: Family Medicine

## 2019-06-30 ENCOUNTER — Other Ambulatory Visit: Payer: Self-pay | Admitting: Family Medicine

## 2019-07-26 ENCOUNTER — Telehealth (INDEPENDENT_AMBULATORY_CARE_PROVIDER_SITE_OTHER): Payer: Medicaid Other | Admitting: Family Medicine

## 2019-07-26 ENCOUNTER — Other Ambulatory Visit: Payer: Self-pay

## 2019-07-26 DIAGNOSIS — F17200 Nicotine dependence, unspecified, uncomplicated: Secondary | ICD-10-CM | POA: Diagnosis not present

## 2019-07-26 DIAGNOSIS — W19XXXA Unspecified fall, initial encounter: Secondary | ICD-10-CM

## 2019-07-26 DIAGNOSIS — R0602 Shortness of breath: Secondary | ICD-10-CM

## 2019-07-26 NOTE — Progress Notes (Signed)
[place] Telemedicine Visit I connected with  KOBE BUCCIARELLI on 07/26/19 by a video enabled telemedicine application and verified that I am speaking with the correct person using two identifiers.   I discussed the limitations of evaluation and management by telemedicine. The patient expressed understanding and agreed to proceed.  Patient consented to have virtual visit. Method of visit: Video was attempted, but technology challenges prevented patient from using video, so visit was conducted via telephone.  Encounter participants: Patient: [name] - located at [employer] parked in parking lot of [facility] Provider: Caroline More - located at [facility] Others (if applicable): Husband, Alyana Kempter  Chief Complaint: Cough  HPI: Cough Patient reports "coughing her head off", full of phlegm, and SOB. Symptoms have persisted x1-2 months. SOB is only with exertion. Saw Dr. Janus Molder on 05/2019 and got prednisone which helped with breathing but did not help with phlegm. Reports increased albuterol use. Compliant on all other meds. Reports she is retaining fluids. Gained 20-30lbs in 1 month. Reports no swelling. Reports urinating q2-3 min. Reports she is still smoking, 1-1.5 ppd. At night time she feels like "shes about to strangle to death".   Falls Patient reports she falls frequently. Wants a motorized scooter.   ROS: per HPI  Pertinent PMHx: CHF, pulmonary HTN, chronic bronchitis, COPD, OSA, tobacco use, DJD  Exam:  Respiratory: Speaking full sentences, no increased work of breathing  Assessment/Plan:  SOB (shortness of breath) Patient with shortness of breath as well as cough.  There are multiple differentials that this could be given patient's history.  Patient has a history of COPD and reports improvement with albuterol and prednisone.  This could be a COPD exacerbation.  This could also be a heart failure exacerbation given weight gain and PND symptoms.  She may also have  coronavirus.  Regardless I think patient needs to be evaluated in person and have a lung exam as well as possible chest x-ray.  May need lab work as well including a CBC, BNP.  Given that we are not able to see patient in person given her symptoms of cough and shortness of breath, I recommended she go to the urgent care.  She is speaking full sentences to me and does not appear to have increased work of breathing.  We will try and avoid sending her to the emergency department to avoid exposure to possible coronavirus.  Patient is agreeable with this plan and her husband will drive her there immediately.  She is currently in the [facility] parking lot as this was originally an in person visit but given her symptoms I discussed with Dr. Nori Riis and it was decided that this patient should be better served with a virtual visit to avoid exposure to others.  Falls Seems to be an issue that has been ongoing.  Last saw Dr. Criss Rosales on 01/20/2019.  Given the acuity of her shortness of breath this was not discussed in significant detail as I felt it was more pertinent for her to seek care for her shortness of breath immediately.  Patient discussed desire for motorized scooter.  I explained to her that this evaluation will be done by neuro PT.  If she desires this after her shortness of breath improves I will send this referral.  Tobacco use disorder Current cessation.  I informed her that this will likely help with her breathing as well.  Patient states at the moment she would not like to discuss this and is not  interested in tobacco cessation.  I offered her resources and informed her if she ever desires to quit smoking to please let us know.    Time spent during visit with patient: 15 minutes

## 2019-07-26 NOTE — Assessment & Plan Note (Signed)
Seems to be an issue that has been ongoing.  Last saw Dr. Criss Rosales on 01/20/2019.  Given the acuity of her shortness of breath this was not discussed in significant detail as I felt it was more pertinent for her to seek care for her shortness of breath immediately.  Patient discussed desire for motorized scooter.  I explained to her that this evaluation will be done by neuro PT.  If she desires this after her shortness of breath improves I will send this referral.

## 2019-07-26 NOTE — Assessment & Plan Note (Signed)
Patient with shortness of breath as well as cough.  There are multiple differentials that this could be given patient's history.  Patient has a history of COPD and reports improvement with albuterol and prednisone.  This could be a COPD exacerbation.  This could also be a heart failure exacerbation given weight gain and PND symptoms.  She may also have coronavirus.  Regardless I think patient needs to be evaluated in person and have a lung exam as well as possible chest x-ray.  May need lab work as well including a CBC, BNP.  Given that we are not able to see patient in person given her symptoms of cough and shortness of breath, I recommended she go to the urgent care.  She is speaking full sentences to me and does not appear to have increased work of breathing.  We will try and avoid sending her to the emergency department to avoid exposure to possible coronavirus.  Patient is agreeable with this plan and her husband will drive her there immediately.  She is currently in the [facility] parking lot as this was originally an in person visit but given her symptoms I discussed with Dr. Nori Riis and it was decided that this patient should be better served with a virtual visit to avoid exposure to others.

## 2019-07-26 NOTE — Assessment & Plan Note (Signed)
Current cessation.  I informed her that this will likely help with her breathing as well.  Patient states at the moment she would not like to discuss this and is not interested in tobacco cessation.  I offered her resources and informed her if she ever desires to quit smoking to please let us know.

## 2019-07-27 ENCOUNTER — Telehealth: Payer: Self-pay | Admitting: Family Medicine

## 2019-07-27 NOTE — Telephone Encounter (Signed)
Patient called to inform doctor of her not being able to get an ultrasound yesterday due to the technologist not being in office. Please give pt a call back concerning this matter.

## 2019-07-27 NOTE — Telephone Encounter (Signed)
I discussed this with patient.  Patient states that she did not call regarding an ultrasound.  She was calling regarding the chest x-ray I advised yesterday.  States that the x-ray technician was not at the urgent care yesterday.  She left after she found out out because she was not sure if she should see the physician if they are unable to get an x-ray.  Explained to her that it will still be important for a physician to see patient and do a physical exam as I would like to assess lung function as well as lower extremity edema.  I advised she go to the urgent care as soon as she can.  She states that there is no urgent care and eating near her house, I explained to her that this will be acceptable as well.  Patient to go to urgent care today and management will depend on that physical exam and evaluation.  Strict return precautions given.  ED precautions given.  Dalphine Handing, PGY-3 [facility] 07/27/2019 12:08 PM

## 2019-07-28 ENCOUNTER — Other Ambulatory Visit: Payer: Self-pay | Admitting: Neurology

## 2019-07-28 ENCOUNTER — Other Ambulatory Visit: Payer: Self-pay | Admitting: Family Medicine

## 2019-08-16 ENCOUNTER — Ambulatory Visit (INDEPENDENT_AMBULATORY_CARE_PROVIDER_SITE_OTHER): Payer: Medicaid Other

## 2019-08-16 ENCOUNTER — Encounter (HOSPITAL_COMMUNITY): Payer: Self-pay

## 2019-08-16 ENCOUNTER — Other Ambulatory Visit: Payer: Self-pay

## 2019-08-16 ENCOUNTER — Ambulatory Visit (HOSPITAL_COMMUNITY)
Admission: EM | Admit: 2019-08-16 | Discharge: 2019-08-16 | Disposition: A | Discharge status: Home / Self Care | Payer: Medicaid Other | Attending: Physician Assistant | Admitting: Physician Assistant

## 2019-08-16 DIAGNOSIS — R05 Cough: Secondary | ICD-10-CM

## 2019-08-16 DIAGNOSIS — J441 Chronic obstructive pulmonary disease with (acute) exacerbation: Secondary | ICD-10-CM | POA: Diagnosis not present

## 2019-08-16 DIAGNOSIS — G43819 Other migraine, intractable, without status migrainosus: Secondary | ICD-10-CM

## 2019-08-16 DIAGNOSIS — R0602 Shortness of breath: Secondary | ICD-10-CM | POA: Diagnosis not present

## 2019-08-16 MED ORDER — ONDANSETRON 4 MG PO TBDP
ORAL_TABLET | ORAL | Status: AC
  Filled 2019-08-16: qty 1

## 2019-08-16 MED ORDER — HYDROCODONE-ACETAMINOPHEN 5-325 MG PO TABS
ORAL_TABLET | ORAL | Status: AC
  Filled 2019-08-16: qty 1

## 2019-08-16 MED ORDER — HYDROCODONE-ACETAMINOPHEN 5-325 MG PO TABS
1.0000 | ORAL_TABLET | Freq: Once | ORAL | Status: AC
  Administered 2019-08-16: 13:00:00 1 via ORAL

## 2019-08-16 MED ORDER — PREDNISONE 10 MG PO TABS: 20.0000 mg | ORAL_TABLET | Freq: Two times a day (BID) | ORAL | 0 refills | Status: AC

## 2019-08-16 MED ORDER — ONDANSETRON 4 MG PO TBDP
4.0000 mg | ORAL_TABLET | Freq: Once | ORAL | Status: AC
  Administered 2019-08-16: 4 mg via ORAL

## 2019-08-16 NOTE — ED Triage Notes (Addendum)
Pt states her Dr. Rockey Situ her to come here to get worked up & get antibiotics. Yesterday she coughed so much she had a seizure, she has had this cough for a few mths. States she does NOT want COVID testing.

## 2019-08-16 NOTE — Discharge Instructions (Signed)
Take the prednisone 20mg  two times a day for 5 days  Elevate the head of your bed at night and continue to take your prilosec for your GERD  Please follow up with your primary care in the next 1-2 days to further discuss your chronic conditions.  We discussed that quitting smoking is the number 1 thing you can do for your COPD, please discuss this with your primary care provider.   Go directly to the Emergency Department or call 911 if you have severe chest pain, worsening shortness of breath, severe diarrhea or feel as though you might pass out.

## 2019-08-16 NOTE — ED Provider Notes (Signed)
[place]    CSN: [id number] Arrival date & time: 08/16/19  [id number]      History   Chief Complaint Chief Complaint  Patient presents with  . Cough    HPI [name] is a 64 y.o. female.   Patient with past medical history of COPD, DVT,  DMII diet controlled, and current 1.5 ppd smoker reports to urgent care for 3-4 months of worsening cough and shortness of breath. She reports a cough at baseline, however she has had increased sputum production and frequency and intensity of cough. She reports violent coughing fits and reports an episode yesterday which "caused a seizure". She reports recovering from the seizure and a history of seizures.  She also endorses color change to green sputum. She also reports increased shortness of breath with exertion that takes several minutes for her to regain her breath after resting. She denies oxygen use at home. She has utilized her nebulizer daily, 3 times a day due to this for some time now, with some immediate relief. However, she believes above symptoms continue to progress despite these treatments. She denies chest pain at any time, blood in sputum, denies shortness of breath at rest, fever, chills, nausea, vomiting, lower extremity swelling or pain. She does endorse her baseline headaches.  She had a telemedicine visit on 12/15 with her PCP for these same presenting symptoms and she was recommended to be seen at an urgent care to be further evaluated. Patient reports she was unable to do this and has not been seen by a provider in the interim. She had a similar event in October, for which she had telemedicine visit on 10/2, diagnosed with a COPD exacerbation and treated with prednisone burst. She was to be seen on 10/7 by PCP but this did not happen either. She reports the prednisone at the time helped her shortness of breath but her cough remained.   She also reports 2 episodes within the last 2 weeks of vomiting in her sleep, one of which  she mentioned woke her up choking. She states she has communicated this with her primary care provider. As well, she reports frequent falls at home due to "feet not working" and this is being followed closely by her PCP and a scooter is in the works.  She reports a 30lb weight gain in 1-2 months that she cannot explain. She has no known cardiac history. She does not wish to be tested for Covid.   She is requesting an injection or medication for her Headache and names toradol. She does note that her neurologist has previously told her to avoid this due to potential for seizure.     Past Medical History:  Diagnosis Date  . Allergic rhinitis   . Arthritis    "hands and legs and feet" (04/27/2012)  . Cancer ([place]) 05   rt arm mole rem cancer  . Chronic bronchitis ([place])    "keep it all the time" (04/27/2012)  . COPD (chronic obstructive pulmonary disease) ([place])   . Depression   . Diverticulosis   . DJD (degenerative joint disease)   . Emphysema   . Epilepsy ([place])   . Esophageal stricture   . Exertional dyspnea   . GERD (gastroesophageal reflux disease)   . Glaucoma   . H/O blood clots    Right leg  . Hyperlipidemia   . Hypertension   . IBS (irritable bowel syndrome)   . Migraine   . Neck pain  Cervical disc  . OSA on CPAP    6-7 yrs; pt wears CPAP but has not been able to use it lately due to sinus issues  . Pneumonia 2012; 04/27/2012  . Right leg DVT ([place]) 1982   groin  . Seizures ([place])   . Type II diabetes mellitus ([place])    No meds since weight loss  . UTI (urinary tract infection)    2 weeks ago  . Vertigo 10/24/2014    Patient Active Problem List   Diagnosis Date Noted  . SOB (shortness of breath) 07/26/2019  . Right shoulder pain 02/25/2019  . Falls 01/18/2019  . Knee pain, acute 12/06/2018  . Tobacco use disorder 07/02/2018  . Elevated serum creatinine 06/01/2018  . Chronic bronchitis ([place]) 01/14/2017  . Claudication ([place]) 04/09/2016  . Midline low back pain  without sciatica 02/26/2016  . Seizure disorder ([place]) 11/12/2015  . 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 Chronic obstructive pulmonary disease ([place]) 04/25/2015  . Seizure ([place])   . Syncope, tussive   . Syncope 08/05/2014  . Pulmonary hypertension ([place]) 08/06/2013  . Diastolic CHF ([place]) [id number]  . COPD exacerbation ([place]) 03/29/2013  . BPPV (benign paroxysmal positional vertigo) 03/09/2013  . Chronic respiratory failure ([place]) 11/01/2012  . Left shoulder pain 06/27/2012  . ANXIETY 02/13/2010  . Obstructive sleep apnea 02/19/2009  . SINUSITIS, CHRONIC 08/17/2008  . Vitamin D deficiency 01/21/2008  . OSTEOPENIA 11/24/2007  . Irritable bowel syndrome 11/17/2007  . ALLERGIC RHINITIS 09/24/2007  . COPD GOLD 0  05/12/2007  . Hyperlipidemia 10/08/2006  . OBESITY, NOS  BMI 33.8 10/08/2006  . Cigarette smoker 10/08/2006  . Intractable migraine without aura 10/08/2006  . GLAUCOMA 10/08/2006  . GASTROESOPHAGEAL REFLUX, NO ESOPHAGITIS 10/08/2006  . HERNIA, HIATAL, NONCONGENITAL 10/08/2006  . INCONTINENCE, STRESS, FEMALE 10/08/2006  . DJD (degenerative joint disease) of knee 10/08/2006  . Convulsions ([place]) 10/08/2006    Past Surgical History:  Procedure Laterality Date  . ABDOMINAL HYSTERECTOMY  2001   "partial"  . CARPAL TUNNEL RELEASE  ~ 2008   right  . CATARACT EXTRACTION Bilateral   . COLONOSCOPY WITH ESOPHAGOGASTRODUODENOSCOPY (EGD)    . KNEE ARTHROSCOPY  1990's   left  . NASAL SEPTOPLASTY W/ TURBINOPLASTY Bilateral 05/23/2016   Procedure: NASAL SEPTOPLASTY WITH TURBINATE REDUCTION;  Surgeon: Jerrell Belfast, MD;  Location: [facility];  Service: ENT;  Laterality: Bilateral;  . SHOULDER OPEN ROTATOR CUFF REPAIR  ~ 2010   left  . SINUS ENDO WITH FUSION Right 05/23/2016   Procedure: LIMITED RIGHT ENDOSCOPIC SINUS SURGERY;  Surgeon: Jerrell Belfast, MD;  Location: [facility];  Service: ENT;  Laterality: Right;  . TOTAL KNEE ARTHROPLASTY  08/23/2012    right  . TOTAL KNEE ARTHROPLASTY  08/23/2012   Procedure: TOTAL KNEE ARTHROPLASTY;  Surgeon: Lorn Junes, MD;  Location: [facility];  Service: Orthopedics;  Laterality: Right;  RIGHT KNEE ARTHROPLASTY MEDIAL AND LATERAL COMPARTMENTS WITH PATELLA RESURFACING  . TUBAL LIGATION  2001    OB History   No obstetric history on file.      Home Medications    Prior to Admission medications   Medication Sig Start Date End Date Taking? Authorizing Provider  acetaminophen (TYLENOL) 500 MG tablet Take 1,000 mg by mouth every other day.    [provider]  albuterol (PROVENTIL) (2.5 MG/3ML) 0.083% nebulizer solution USE 1 VIAL IN NEBULIZER EVERY 6 HOURS AS NEEDED FOR SHORTNESS OF BREATH 07/28/19   Caroline More, DO  albuterol (VENTOLIN HFA) 108 (90 Base) MCG/ACT inhaler INHALE 2 PUFFS INTO THE LUNGS EVERY 6 HOURS AS NEEDED FOR SHORTNESS OF BREATH 07/28/19   Caroline More, DO  CALCITRATE 950 MG tablet TAKE ONE TABLET BY MOUTH TWO TIMES DAILY 06/30/19   Caroline More, DO  Cholecalciferol (VITAMIN D3) 50 MCG (2000 UT) capsule TAKE ONE CAPSULE BY MOUTH DAILY 07/28/19   Caroline More, DO  diclofenac sodium (VOLTAREN) 1 % GEL APPLY 2 GRAMS TOPICALLY TO THE AFFECTED AREA 2 TIMES A DAY AS DIRECTED 10/28/18   Caroline More, DO  docusate sodium (COLACE) 100 MG capsule TAKE 1 CAPSULE BY MOUTH 2 TIMES DAILY AS NEEDED FOR MILD CONSTIPATION. 02/26/17   [name], MD  DULERA 200-5 MCG/ACT AERO INHALE 2 PUFFS INTO THE LUNGS TWO TIMES DAILY 07/28/19   Caroline More, DO  Erenumab-aooe (AIMOVIG, 140 MG DOSE,) 70 MG/ML SOAJ Inject 70 mg into the skin every 14 (fourteen) days. 04/05/19   [name], NP  ferrous sulfate 325 (65 FE) MG tablet Take 1 tablet (325 mg total) by mouth 2 (two) times daily with a meal. Patient taking differently: Take 325 mg by mouth daily with breakfast.  09/06/12   Donita Brooks, NP  fluticasone (FLONASE) 50 MCG/ACT nasal spray SPRAY 2 SPRAYS INTO EACH  NOSTRIL DAILY AS NEEDED FOR CONGESTION. 07/28/19   Caroline More, DO  guaiFENesin (MUCINEX) 600 MG 12 hr tablet Take 2 tablets (1,200 mg total) by mouth 2 (two) times daily as needed. 10/20/17   Mikell, Jeani Sow, MD  lamoTRIgine (LAMICTAL) 200 MG tablet Take 1.5 tablets (300 mg total) by mouth 2 (two) times daily. 04/07/19   Kathrynn Ducking, MD  loratadine (CLARITIN) 10 MG tablet TAKE 1 TABLET BY MOUTH EVERY DAY 07/28/19   Caroline More, DO  meloxicam (MOBIC) 15 MG tablet Take 1 tablet (15 mg total) by mouth daily. 03/13/17   Verner Mould, MD  meloxicam (MOBIC) 15 MG tablet Take 1 tablet (15 mg total) by mouth daily. 02/25/19   Kathrene Alu, MD  NON FORMULARY Use CPAP at bedtime    [provider]  omeprazole (PRILOSEC) 40 MG capsule TAKE ONE CAPSULE BY MOUTH 2 TIMES A DAY 07/28/19   Caroline More, DO  polyethylene glycol powder (GLYCOLAX/MIRALAX) powder MIX 17 GMS IN LIQUID AND DRINK ONCE DAILY 02/26/17   [name], MD  predniSONE (DELTASONE) 10 MG tablet Take 2 tablets (20 mg total) by mouth 2 (two) times daily with a meal for 5 days. 08/16/19 08/21/19  Tenaya Hilyer, Marguerita Beards, PA-C  QUEtiapine (SEROQUEL) 100 MG tablet TAKE ONE TABLET BY MOUTH AT BEDTIME 07/28/19   Kathrynn Ducking, MD  sertraline (ZOLOFT) 50 MG tablet TAKE ONE TABLET BY MOUTH DAILY 07/28/19   Caroline More, DO  simvastatin (ZOCOR) 40 MG tablet TAKE 1 TABLET BY MOUTH AT BEDTIME 07/28/19   Caroline More, DO  topiramate (TOPAMAX) 100 MG tablet TAKE ONE TABLET BY MOUTH EVERY MORNING AND 2 TABLETS AT BEDTIME - MUST KEEP APPOINTMENT 04/02/18 for refills 04/05/19   [name], NP    Family History Family History  Problem Relation Age of Onset  . Alcohol abuse Father   . Lung cancer Father        was a smoker  . Emphysema Father        was a smoker  . Heart disease Sister   . Lung cancer Mother        was a smoker  .  Stroke Mother   . Other Sister        blood clotting disorder  .  Seizures Brother   . Heart disease Maternal Grandfather   . Diabetes Maternal Grandfather   . Heart disease Other        uncle  . Diabetes Other        uncles/aunts  . Breast cancer Maternal Aunt   . Bipolar disorder Son   . Muscular dystrophy Son   . Heart disease Son   . Colon cancer Neg Hx     Social History Social History   Tobacco Use  . Smoking status: Current Every Day Smoker    Packs/day: 1.00    Years: 40.00    Pack years: 40.00    Types: Cigarettes    Start date: 08/12/1975  . Smokeless tobacco: Former Systems developer    Quit date: 08/23/2012  . Tobacco comment: started back smoking in 11/2012  Substance Use Topics  . Alcohol use: No    Alcohol/week: 0.0 standard drinks  . Drug use: No     Allergies   Amoxicillin, Azithromycin, Cefdinir, Chantix [varenicline], Doxycycline hyclate, Levofloxacin, Penicillins, Sulfonamide derivatives, Toradol [ketorolac tromethamine], Clarithromycin, Nortriptyline, Codeine, Fluticasone-salmeterol, and Triptans   Review of Systems Review of Systems  Constitutional: Positive for activity change. Negative for chills, diaphoresis and fever.  HENT: Positive for congestion and sneezing. Negative for ear pain, postnasal drip, sinus pressure, sinus pain, sore throat and trouble swallowing.   Eyes: Negative for pain and visual disturbance.  Respiratory: Positive for cough and shortness of breath.   Cardiovascular: Negative for chest pain, palpitations and leg swelling.  Gastrointestinal: Negative for abdominal pain, constipation, diarrhea, nausea and vomiting.  Genitourinary: Negative for dysuria and hematuria.  Musculoskeletal: Negative for arthralgias, back pain and myalgias.  Skin: Negative for color change and rash.  Neurological: Positive for seizures and headaches. Negative for syncope.  All other systems reviewed and are negative.    Physical Exam Triage Vital Signs ED Triage Vitals  Enc Vitals Group     BP 08/16/19 1031 (!) 112/57      Pulse Rate 08/16/19 1031 81     Resp 08/16/19 1031 18     Temp 08/16/19 1031 98 F (36.7 C)     Temp Source 08/16/19 1031 Oral     SpO2 08/16/19 1031 98 %     Weight 08/16/19 1032 211 lb (95.7 kg)     Height --      Head Circumference --      Peak Flow --      Pain Score 08/16/19 1032 10     Pain Loc --      Pain Edu? --      Excl. in [place]? --    No data found.  Updated Vital Signs BP (!) 112/57 (BP Location: Left Arm)   Pulse 81   Temp 98 F (36.7 C) (Oral)   Resp 18   Wt 211 lb (95.7 kg)   SpO2 98%   BMI 39.87 kg/m   Visual Acuity Right Eye Distance:   Left Eye Distance:   Bilateral Distance:    Right Eye Near:   Left Eye Near:    Bilateral Near:     Physical Exam Vitals and nursing note reviewed.  Constitutional:      General: She is not in acute distress.    Appearance: She is well-developed. She is not ill-appearing.  HENT:     Head: Normocephalic and atraumatic.  Nose: No congestion or rhinorrhea.     Mouth/Throat:     Mouth: Mucous membranes are moist.     Pharynx: Oropharynx is clear. No oropharyngeal exudate or posterior oropharyngeal erythema.  Eyes:     General: No scleral icterus.    Conjunctiva/sclera: Conjunctivae normal.     Pupils: Pupils are equal, round, and reactive to light.  Cardiovascular:     Rate and Rhythm: Normal rate and regular rhythm.     Pulses: Normal pulses.     Comments: Heart sounds distant Pulmonary:     Effort: Pulmonary effort is normal. No respiratory distress.     Breath sounds: Rhonchi (imrpove with cough) present. No wheezing or rales.  Abdominal:     Palpations: Abdomen is soft.     Tenderness: There is no abdominal tenderness. There is no right CVA tenderness or left CVA tenderness.  Musculoskeletal:        General: No tenderness.     Cervical back: Neck supple.     Right lower leg: No edema.     Left lower leg: No edema.  Skin:    General: Skin is warm and dry.  Neurological:     General: No focal  deficit present.     Mental Status: She is alert and oriented to person, place, and time.  Psychiatric:        Mood and Affect: Mood normal.        Behavior: Behavior normal.        Thought Content: Thought content normal.        Judgment: Judgment normal.      UC Treatments / Results  Labs (all labs ordered are listed, but only abnormal results are displayed) Labs Reviewed - No data to display  EKG   Radiology DG Chest 2 View  Result Date: 08/16/2019 CLINICAL DATA:  Productive cough.  Shortness of breath. EXAM: CHEST - 2 VIEW COMPARISON:  10/24/2016. FINDINGS: Mediastinum and hilar structures normal. Lungs are clear. No pleural effusion or pneumothorax. Heart size normal. No acute bony abnormality. IMPRESSION: No acute cardiopulmonary disease. Electronically Signed   By: Marcello Moores  Register   On: 08/16/2019 11:18    Procedures Procedures (including critical care time)  Medications Ordered in UC Medications  HYDROcodone-acetaminophen (NORCO/VICODIN) 5-325 MG per tablet 1 tablet (has no administration in time range)  ondansetron (ZOFRAN-ODT) disintegrating tablet 4 mg (has no administration in time range)    Initial Impression / Assessment and Plan / UC Course  I have reviewed the triage vital signs and the nursing notes.  Pertinent labs & imaging results that were available during my care of the patient were reviewed by me and considered in my medical decision making (see chart for details).     # COPD Exacerbation - Xray clear, no sign of pulmonary edema or pneumonia. Due to numerous anti-biotic allergies, will hold on anti-biotic and treat with prednisone 20mg  BID x 5 days and close follow up with PCP and likely need for re-evaluation by pulmonologist due to likely worsening COPD. Discussed need to cease smoking. ED precautions given.  #migraine - patient states history of migraine and this is typical for her. Tylenol not working. Request for toradol, discussed concern for  interactions with other medications and history of seizure. Agrees to office dose of Norco and zofran. And need to continue to follow up with provider that manages her headaches.  With regard to history given about vomiting at night. Recommending continue current GERD regiment and elevate head  of bed. Reassured no aspiration with Chest xray clear. Instructed to follow up with PCP about this matter and all other chronics. Final Clinical Impressions(s) / UC Diagnoses   Final diagnoses:  COPD exacerbation ([place])  Other migraine without status migrainosus, intractable     Discharge Instructions     Take the prednisone 20mg  two times a day for 5 days  Elevate the head of your bed at night and continue to take your prilosec for your GERD  Please follow up with your primary care in the next 1-2 days to further discuss your chronic conditions.  We discussed that quitting smoking is the number 1 thing you can do for your COPD, please discuss this with your primary care provider.   Go directly to the Emergency Department or call 911 if you have severe chest pain, worsening shortness of breath, severe diarrhea or feel as though you might pass out.      ED Prescriptions    Medication Sig Dispense Auth. Provider   predniSONE (DELTASONE) 10 MG tablet Take 2 tablets (20 mg total) by mouth 2 (two) times daily with a meal for 5 days. 20 tablet Jesslyn Viglione, Marguerita Beards, PA-C     PDMP not reviewed this encounter.   Purnell Shoemaker, PA-C 08/16/19 1215

## 2019-08-16 NOTE — ED Notes (Signed)
Pt. States she has fallen 10 times in the last 3 mths.

## 2019-08-24 ENCOUNTER — Other Ambulatory Visit: Payer: Self-pay | Admitting: Family Medicine

## 2019-09-12 ENCOUNTER — Telehealth (INDEPENDENT_AMBULATORY_CARE_PROVIDER_SITE_OTHER): Payer: Medicaid Other | Admitting: Family Medicine

## 2019-09-12 ENCOUNTER — Other Ambulatory Visit: Payer: Self-pay

## 2019-09-12 DIAGNOSIS — J441 Chronic obstructive pulmonary disease with (acute) exacerbation: Secondary | ICD-10-CM | POA: Diagnosis not present

## 2019-09-12 MED ORDER — CLINDAMYCIN HCL 300 MG PO CAPS: 300.0000 mg | ORAL_CAPSULE | Freq: Three times a day (TID) | ORAL | 0 refills | Status: DC

## 2019-09-12 MED ORDER — PREDNISONE 20 MG PO TABS: 40.0000 mg | ORAL_TABLET | Freq: Every day | ORAL | 0 refills | Status: DC

## 2019-09-12 NOTE — Progress Notes (Signed)
Agrees to video visit.  1/2 way through we switched to telephone due to poor connection.  "I've got bronchitis again." Cough, change in phelgm both color and quantity.  Mild SOB.  No fever.  States this feels typical of her COPD.  She has maintained excellent isolation.  No one else in household is sick.  She feels she is low risk for COVID and does not want testing.  Problem list includes CHF.  Denies ankle swelling.  Conitnues to smoke.  Can't take chantix due to seizure hx.  Has been to Dr. Valentina Lucks x2.  States multiple other smokers in the household.  Offered repeat referral to Dr. Valentina Lucks for cessation, she declined.  Has NUMEROUS drug allergies.  We agreed: 1. Prednisone and clindamicin (one of the few choices available given her allergy list.) 2. Refer back to pulmonology. 3. She understands that smoking cessation is the single biggest thing she could do to improve her health.  Duration of video visit was 20 minutes plus 5 minutes of documentation, medications and referral.

## 2019-09-13 ENCOUNTER — Telehealth: Payer: Self-pay | Admitting: *Deleted

## 2019-09-13 NOTE — Telephone Encounter (Signed)
She was calling aimovig, wanted to confirm that the way aimovig was written was correct.  I relayed that it is correct in that pts injection was not lasting the whole 30 days and NP wrote to 70mg  everu 14days to see if this is more effective.  Per other pharmacy's it was a financial loss, so it is dependent on the pharmacy whether they will approve it or not.  She stated they  Would do for the pt.

## 2019-09-15 ENCOUNTER — Other Ambulatory Visit: Payer: Self-pay | Admitting: Neurology

## 2019-09-15 ENCOUNTER — Other Ambulatory Visit: Payer: Self-pay | Admitting: Family Medicine

## 2019-09-27 ENCOUNTER — Other Ambulatory Visit: Payer: Self-pay | Admitting: Family Medicine

## 2019-09-27 DIAGNOSIS — J441 Chronic obstructive pulmonary disease with (acute) exacerbation: Secondary | ICD-10-CM

## 2019-10-06 ENCOUNTER — Other Ambulatory Visit: Payer: Self-pay | Admitting: Family Medicine

## 2019-10-07 ENCOUNTER — Other Ambulatory Visit: Payer: Self-pay | Admitting: Family Medicine

## 2019-10-12 ENCOUNTER — Encounter: Payer: Self-pay | Admitting: Neurology

## 2019-10-12 ENCOUNTER — Telehealth (INDEPENDENT_AMBULATORY_CARE_PROVIDER_SITE_OTHER): Payer: Medicaid Other | Admitting: Neurology

## 2019-10-12 DIAGNOSIS — R1312 Dysphagia, oropharyngeal phase: Secondary | ICD-10-CM | POA: Diagnosis not present

## 2019-10-12 MED ORDER — DIVALPROEX SODIUM 250 MG PO DR TAB: DELAYED_RELEASE_TABLET | ORAL | 3 refills | Status: DC

## 2019-10-12 NOTE — Progress Notes (Signed)
     Virtual Visit via Telephone Note  I connected with [name] on 10/12/19 at  3:30 PM EST by telephone and verified that I am speaking with the correct person using two identifiers.  Location: Patient: The patient is at home. Provider: Physician in office.   I discussed the limitations, risks, security and privacy concerns of performing an evaluation and management service by telephone and the availability of in person appointments. I also discussed with the patient that there may be a patient responsible charge related to this service. The patient expressed understanding and agreed to proceed.   History of Present Illness: [name] is a 64 year old right-handed white female with a history of intractable epilepsy and intractable headache.  The patient has been on lamotrigine and Topamax without much benefit with her seizures.  She continues to have frequent headaches, her headaches have been daily in nature.  She has not responded to Ajovy.  In the past, she has been on Botox injections and seemed to improve some with the headaches.  More recently in the last month she is having increasing problems with swallowing with both liquids and solids.  She indicates that food will get caught in the back of the throat and she has to cough it back up again.  She is not getting food down in the lungs.  She has noted some hoarseness of the voice.  She is not losing weight, in fact she is gaining weight.  The patient does not operate a motor vehicle.  She was set up for video visit today but cannot access this, a telephone visit therefore was done.   Observations/Objective: Telephone interview was done, speech does sound slightly raspy.  The patient is alert and cooperative, answering questions appropriately.  Assessment and Plan: 1.  Intractable epilepsy  2.  Intractable headaches, daily headaches  3.  New onset dysphagia  The patient continues to have frequent seizures and frequent headaches.   I will add low-dose Depakote to the regimen, she will remain on the Lamictal and the Topamax.  We will have to go very slowly with the Depakote given the concurrent use of Lamictal.  Given the swallowing problems, the patient be set up for a modified barium swallow.  She will follow-up here in 3 months.  We will check blood work at that time.  If the headaches do not improve with the use of Ajovy and Depakote, may consider retrial on Botox.  Follow Up Instructions: 71-month follow-up, may see nurse practitioner   I discussed the assessment and treatment plan with the patient. The patient was provided an opportunity to ask questions and all were answered. The patient agreed with the plan and demonstrated an understanding of the instructions.   The patient was advised to call back or seek an in-person evaluation if the symptoms worsen or if the condition fails to improve as anticipated.  I provided 15 minutes of non-face-to-face time during this encounter.   Kathrynn Ducking, MD

## 2019-10-13 ENCOUNTER — Other Ambulatory Visit: Payer: Self-pay

## 2019-10-25 ENCOUNTER — Ambulatory Visit (HOSPITAL_COMMUNITY): Payer: Medicaid Other

## 2019-10-25 ENCOUNTER — Encounter (HOSPITAL_COMMUNITY): Payer: Medicaid Other

## 2019-11-02 ENCOUNTER — Other Ambulatory Visit: Payer: Self-pay | Admitting: Neurology

## 2019-11-02 ENCOUNTER — Other Ambulatory Visit: Payer: Self-pay | Admitting: Family Medicine

## 2019-11-14 ENCOUNTER — Telehealth (INDEPENDENT_AMBULATORY_CARE_PROVIDER_SITE_OTHER): Payer: Medicaid Other | Admitting: Family Medicine

## 2019-11-14 DIAGNOSIS — W19XXXD Unspecified fall, subsequent encounter: Secondary | ICD-10-CM

## 2019-11-14 DIAGNOSIS — J441 Chronic obstructive pulmonary disease with (acute) exacerbation: Secondary | ICD-10-CM

## 2019-11-14 DIAGNOSIS — Z9181 History of falling: Secondary | ICD-10-CM | POA: Diagnosis not present

## 2019-11-14 MED ORDER — PREDNISONE 20 MG PO TABS: 40.0000 mg | ORAL_TABLET | Freq: Every day | ORAL | 0 refills | Status: AC

## 2019-11-14 MED ORDER — CLINDAMYCIN HCL 300 MG PO CAPS: 300.0000 mg | ORAL_CAPSULE | Freq: Three times a day (TID) | ORAL | 0 refills | Status: DC

## 2019-11-14 NOTE — Assessment & Plan Note (Signed)
Seems to be an ongoing issue. Will need to be evaluated in person. Currently having acute cough and SOB so will not be able to be seen in person so advised f/u when cough resolves. Patient desires motorized scooter, will discuss at in person visit. If applicable will refer to neuro PT. ED precautions discussed. F/u when over acute cough.

## 2019-11-14 NOTE — Progress Notes (Signed)
[place] Telemedicine Visit I connected with  [name] on 11/14/19 by a video enabled telemedicine application and verified that I am speaking with the correct person using two identifiers.   I discussed the limitations of evaluation and management by telemedicine. The patient expressed understanding and agreed to proceed.   Patient consented to have virtual visit. Method of visit: Video was attempted, but technology challenges prevented patient from using video, so visit was conducted via telephone.  Encounter participants: Patient: [name] - located at Home Provider: Caroline More - located at [facility] Others (if applicable): None  Chief Complaint: bronchitis   HPI:  Bronchitis  Patient presenting with concerns of bronchitis. Patient reports worsening cough and SOB. Patient reports green phlegm "coming up". No fevers, no chest pain. Decreased sleep due to coughing. Cough is present "all the time". Denies sick contacts, no household members sick. No COVID exposure, does not want testing at this time. Denies edema. Does smoke, 1.5ppd.   Weakness  Reports falls x 2 months, progressively worsening. Reports trouble walking, legs giving way. Patient would like a motorized schooter. Reports her legs are getting weak and she feels like she is falling "all the time". Reports using walker but it gives way. Feels like her arms are hurting all the time. Reports chronic incontinence.   ROS: per HPI  Pertinent PMHx: COPD, GERD, vit D deficiency, osteopenia, incontinence, HLD, convulsions   Exam:  Respiratory: speaking full sentences, voice somewhat hoarse, speaking full sentences, no increased WOB  Assessment/Plan:  COPD exacerbation ([place]) Patient with symptoms consistent with acute exacerbation, with productive cough. Speaking full sentences without increased WOB. Afebrile. Will treat with steroid burst and abx. Patient with several drug allergies but clindamycin  as been tolerated in the past. Patient understanding of this and understanding of when to return. In the past patient was referred to pulmonology but has not seen them yet. Phone number given and patient will call to set up f/u - Prednisone 40mg  qd x5d - Clindamycin  - Return precautions given, patient voiced understanding - Can consider CXR if worsening  - stressed importance of pulmonology f/u. Would likely benefit from repeat PFT - Advised that if she feels short of breath, she should go to ED - Continue home COPD medications  Falls Seems to be an ongoing issue. Will need to be evaluated in person. Currently having acute cough and SOB so will not be able to be seen in person so advised f/u when cough resolves. Patient desires motorized scooter, will discuss at in person visit. If applicable will refer to neuro PT. ED precautions discussed. F/u when over acute cough.     Time spent during visit with patient: 15 minutes

## 2019-11-14 NOTE — Assessment & Plan Note (Signed)
Patient with symptoms consistent with acute exacerbation, with productive cough. Speaking full sentences without increased WOB. Afebrile. Will treat with steroid burst and abx. Patient with several drug allergies but clindamycin as been tolerated in the past. Patient understanding of this and understanding of when to return. In the past patient was referred to pulmonology but has not seen them yet. Phone number given and patient will call to set up f/u - Prednisone 40mg  qd x5d - Clindamycin  - Return precautions given, patient voiced understanding - Can consider CXR if worsening  - stressed importance of pulmonology f/u. Would likely benefit from repeat PFT - Advised that if she feels short of breath, she should go to ED - Continue home COPD medications

## 2019-11-15 ENCOUNTER — Encounter (HOSPITAL_COMMUNITY): Payer: Medicaid Other

## 2019-11-15 ENCOUNTER — Ambulatory Visit (HOSPITAL_COMMUNITY): Payer: Medicaid Other

## 2019-11-22 ENCOUNTER — Ambulatory Visit (HOSPITAL_COMMUNITY)
Admission: RE | Admit: 2019-11-22 | Discharge: 2019-11-22 | Disposition: A | Discharge status: Home / Self Care | Payer: Medicaid Other | Source: Ambulatory Visit | Attending: Neurology | Admitting: Neurology

## 2019-11-22 ENCOUNTER — Other Ambulatory Visit: Payer: Self-pay

## 2019-11-22 DIAGNOSIS — R1312 Dysphagia, oropharyngeal phase: Secondary | ICD-10-CM

## 2019-11-22 NOTE — Progress Notes (Signed)
Modified Barium Swallow Progress Note  Patient Details  Name: [name] MRN: [id number] Date of Birth: June 13, 1956  Today's Date: 11/22/2019  Modified Barium Swallow completed.  Full report located under Chart Review in the Imaging Section.  Brief recommendations include the following:  Clinical Impression  Pt exhibits a mild pharyngeal dysphagia resulting in laryngeal vestibule penetration with thin liquids likely due to chronic respiratory compromise with COPD. Impairments were marked by mistiming of protective mechanisms leading to penetration during the swallow mostly and once prior to swallow. Frequently the swallow was not initiated until the thin and nectar barium reached her valleculae and sat for approximately 3-4 seconds. Penetrates eventually reached her vocal cords with consistent throat clearing. Cues for pt to produce a more effortful cough and throat clear could not fully clear penetrates. Chin tuck, right head turn and breath hold were attempted with the breath hold resulting in a more coordinated and timely swallow but prevented penetration 80% of trials. Esophageal scan only revealed part of ther esophagus with no significant findings. Recommend continue regular texture using caution with bread and meats, thin liquids using breath hold technique and pills whole in puree. Educated pt re: thickener to purchase and use if she becomes sick, has labored breathing or feels like swallow may be compromised.        Swallow Evaluation Recommendations       SLP Diet Recommendations: Regular solids;Thin liquid   Liquid Administration via: Cup   Medication Administration: Whole meds with puree   Supervision: Patient able to self feed   Compensations: Slow rate;Small sips/bites;Clear throat intermittently;Other (Comment)(breath hold)   Postural Changes: Remain semi-upright after after feeds/meals (Comment);Seated upright at 90 degrees   Oral Care Recommendations: Oral care BID         Houston Siren 11/22/2019,2:52 PM   Orbie Pyo Colvin Caroli.Ed Risk analyst [phone] Office [phone]

## 2019-11-30 ENCOUNTER — Ambulatory Visit (INDEPENDENT_AMBULATORY_CARE_PROVIDER_SITE_OTHER): Payer: Medicaid Other | Admitting: Family Medicine

## 2019-11-30 ENCOUNTER — Other Ambulatory Visit: Payer: Self-pay

## 2019-11-30 ENCOUNTER — Encounter: Payer: Self-pay | Admitting: Family Medicine

## 2019-11-30 VITALS — BP 112/66 | HR 115 | Ht 61.0 in | Wt 194.2 lb

## 2019-11-30 DIAGNOSIS — Z1239 Encounter for other screening for malignant neoplasm of breast: Secondary | ICD-10-CM

## 2019-11-30 DIAGNOSIS — E118 Type 2 diabetes mellitus with unspecified complications: Secondary | ICD-10-CM

## 2019-11-30 DIAGNOSIS — F17200 Nicotine dependence, unspecified, uncomplicated: Secondary | ICD-10-CM | POA: Diagnosis not present

## 2019-11-30 DIAGNOSIS — M25512 Pain in left shoulder: Secondary | ICD-10-CM

## 2019-11-30 DIAGNOSIS — J441 Chronic obstructive pulmonary disease with (acute) exacerbation: Secondary | ICD-10-CM

## 2019-11-30 DIAGNOSIS — R531 Weakness: Secondary | ICD-10-CM | POA: Diagnosis not present

## 2019-11-30 DIAGNOSIS — R0602 Shortness of breath: Secondary | ICD-10-CM

## 2019-11-30 DIAGNOSIS — Z1231 Encounter for screening mammogram for malignant neoplasm of breast: Secondary | ICD-10-CM

## 2019-11-30 LAB — POCT GLYCOSYLATED HEMOGLOBIN (HGB A1C): HbA1c, POC (controlled diabetic range): 5.6 % (ref 0.0–7.0)

## 2019-11-30 MED ORDER — DICLOFENAC SODIUM 1 % EX GEL: 2.0000 g | Freq: Four times a day (QID) | CUTANEOUS | 1 refills | Status: DC

## 2019-11-30 NOTE — Assessment & Plan Note (Signed)
During physical exam patient mentioned that she had shoulder pain.  It is almost a locking feeling.  Physical exam consistent with possible impingement.  I discussed exercises as well as Voltaren gel.  Patient will be used daily and follow-up in 1 month.  Can consider injection if worsening.

## 2019-11-30 NOTE — Progress Notes (Signed)
SUBJECTIVE:   CHIEF COMPLAINT / HPI:   Desire for motorized scooter Patient presenting for concern of desire for motorized scooter.  States that she has been using a walker for over a year now but reports that for the past 6 months she notices more weakness.  Falls every other day due to this.  Reports that she also has significant shortness of breath with exertion.  Can barely get to the bathroom or kitchen without making multiple stops to take breaths.  Denies any dizziness.  Denies any loss of consciousness or head trauma.  Denies any numbness or tingling.  Diabetes Fasting checks: does not check  Post prandial check   Compliance: good  Diet: limited sweets   Exercise: none  Eye exam: has performed, will have them send records  Foot exam: will perform today A1C: 5.6 Symptoms: no symptoms of hypoglycemia. no symptoms of  polyuria, polydipsia. no numbness in extremities, and no foot ulcers/trauma Meds: diet controlled  Pneumonia vaccine: UTD  Urine micro albumin:creatine ratio: will obtain today   Monitoring Labs and Parameters Last A1C:  Lab Results  Component Value Date   HGBA1C 5.6 11/30/2019   Last Lipid:     Component Value Date/Time   CHOL 148 06/19/2014 1656   HDL 52 06/19/2014 1656   LDLDIRECT 77 09/14/2012 1618   Last Bmet  Potassium  Date Value Ref Range Status  04/05/2019 4.3 3.5 - 5.2 mmol/L Final   Sodium  Date Value Ref Range Status  04/05/2019 139 134 - 144 mmol/L Final   Creat  Date Value Ref Range Status  06/13/2016 0.91 0.50 - 1.05 mg/dL Final    Comment:      For patients > or = 62 years of age: The upper reference limit for Creatinine is approximately 13% higher for people identified as African-American.      Creatinine, Ser  Date Value Ref Range Status  04/05/2019 1.02 (H) 0.57 - 1.00 mg/dL Final      PERTINENT  PMH / PSH: COPD, DJD, osteopenia, oesity,   OBJECTIVE:   BP 112/66   Pulse (!) 115   Ht 5\' 1"  (1.549 m)   Wt 194  lb 3.2 oz (88.1 kg)   SpO2 95%   BMI 36.69 kg/m   Gen: awake and alert, NAD Cardio: RRR, no MRG Resp: course breath sounds throughout, speaking full sentences, no increased WOB GI: soft, non tender, non distended Ext: no edema, 5/5 strength in RLE 4/5 strength in LLE Neuro: normal gait but slowed, no focal deficits  .Shoulder: Inspection reveals no obvious deformity, atrophy, or asymmetry. No bruising. No swelling Palpation is normal with no TTP over [facility] joint or bicipital groove. Full ROM in flexion, abduction, internal/external rotation NV intact distally. Pain with abduction  Normal scapular function observed. Special Tests:  - Impingement: Positive empty can sign. - Supraspinatous: Pos empty can.  5/5 strength with resisted flexion at 20 degrees - Infraspinatous/Teres Minor: 5/5 strength with ER - Biceps tendon: Negative Speeds, Yerrgason's  - Labrum: Negative Obriens, negative clunk, good stability - No painful arc and no drop arm sign  ASSESSMENT/PLAN:   Weakness Patient reports continued weakness.  I anticipate this is a component of deconditioning with shortness of breath from COPD.  I am more concerned about her shortness of breath and difficulty getting to places like the back of medication to do daily activities.  Due to this concern I will send her to neuro rehab for scooter evaluation.  I  discussed the importance of continued exercise to help build up her strength.  Have offered to remove some lower extremity exercises during the clinic and she will perform these at home daily.  Follow-up with neuro rehab.  Chronic obstructive pulmonary disease (HCC) Patient with worsening COPD and frequent exacerbations.  Was previously referred to pulmonology but has been unable to schedule an appointment.  I was able to contact with our pulmonology care in [place] and schedule this patient for an appointment.  This appointment time and date was placed in patient's after visit summary  and she is aware of the time.  Advised to stop smoking.  Follow-up with pulmonology.  Tobacco use disorder Currently smokes 1.5 packs/day.  Encourage cessation.  She is not at the point where she wants to stop given that she has 2 smokers in the house as well.  I encouraged Her family stop smoking.  Patient will schedule follow-up appointment of tobacco cessation.  Left shoulder pain During physical exam patient mentioned that she had shoulder pain.  It is almost a locking feeling.  Physical exam consistent with possible impingement.  I discussed exercises as well as Voltaren gel.  Patient will be used daily and follow-up in 1 month.  Can consider injection if worsening.     Caroline More, [place]

## 2019-11-30 NOTE — Assessment & Plan Note (Signed)
Patient with worsening COPD and frequent exacerbations.  Was previously referred to pulmonology but has been unable to schedule an appointment.  I was able to contact with our pulmonology care in [place] and schedule this patient for an appointment.  This appointment time and date was placed in patient's after visit summary and she is aware of the time.  Advised to stop smoking.  Follow-up with pulmonology.

## 2019-11-30 NOTE — Assessment & Plan Note (Signed)
Patient reports continued weakness.  I anticipate this is a component of deconditioning with shortness of breath from COPD.  I am more concerned about her shortness of breath and difficulty getting to places like the back of medication to do daily activities.  Due to this concern I will send her to neuro rehab for scooter evaluation.  I discussed the importance of continued exercise to help build up her strength.  Have offered to remove some lower extremity exercises during the clinic and she will perform these at home daily.  Follow-up with neuro rehab.

## 2019-11-30 NOTE — Patient Instructions (Addendum)
For your difficulty walking 1. I have referred you to neuro rehab for a motorized scooter  2. Please make sure to do exercises daily. The best exercise to start first is getting up and sitting back down in a chair  For your shoulder: 1. Use voltaren gel 2. Do the shoulder exercises we discussed 3. Follow up in 1 month  For your COPD:  I have scheduled you for a pulmonology appointment on Monday 12/19/19 @3PM   [facility] [address], [place], [place] [address] [phone]

## 2019-11-30 NOTE — Assessment & Plan Note (Signed)
Currently smokes 1.5 packs/day.  Encourage cessation.  She is not at the point where she wants to stop given that she has 2 smokers in the house as well.  I encouraged Her family stop smoking.  Patient will schedule follow-up appointment of tobacco cessation.

## 2019-12-02 LAB — MICROALBUMIN / CREATININE URINE RATIO
Creatinine, Urine: 81.3 mg/dL
Microalb/Creat Ratio: 19 mg/g creat (ref 0–29)
Microalbumin, Urine: 15.3 ug/mL

## 2019-12-05 ENCOUNTER — Telehealth: Payer: Self-pay | Admitting: *Deleted

## 2019-12-05 NOTE — Telephone Encounter (Signed)
Pt informed. [name], CMA  

## 2019-12-05 NOTE — Telephone Encounter (Signed)
-----   Message from Caroline More, DO sent at 12/02/2019  5:10 PM EDT ----- Please inform patient that results of urine microalbumin are negative.

## 2019-12-06 ENCOUNTER — Ambulatory Visit: Payer: Medicaid Other

## 2019-12-07 ENCOUNTER — Telehealth: Payer: Self-pay

## 2019-12-07 NOTE — Progress Notes (Signed)
   [place] Telemedicine Visit  Patient consented to have virtual visit and was identified by name and date of birth. Method of visit: Video was attempted, but technology challenges prevented patient from using video, so visit was conducted via telephone.  Encounter participants: Patient: [name] - located at [phone] Provider: Daisy Floro - located at [facility] Others (if applicable): None  Chief Complaint: "Bronchitis"  HPI:  SOB, "Coughing a ton": patient states she has been having shortness of breath with ambulation and coughing a lot, symptoms kicked in 3-4 days ago, has been taking Tylenol, nothing's helping, using inhalers (ProAir - taking 2 puffs q2, Dulera- taking BID), cough is productive for green phlegm.  Patient describes SOB with short walks to bathroom, worse with movement, improves after she's been resting for a while. No fevers, runny nose, fevers, body aches, and chills.  Denies any changes in weight.  No sick contacts. No recent travel. Smoking 1/2 ppd now. Has appt with Lung doctor May 10th.  No weight gain, no Hx of HF.    ROS: per HPI  Pertinent PMHx:  Tobacco use Disorder COPD Chronic Bronchitis Obesity GERD  Exam:  There were no vitals taken for this visit. -Virtual visit Respiratory: Patient able to speak in full sentences  Assessment/Plan: Shortness of breath As the differential for the patient shortness of breath is broad (including Covid, viral infection, bacterial infection, COPD exacerbation, pneumonia, PE, ?New onset heart failure) and this is a virtual visit, I strongly recommend patient be seen at an office & where she can be evaluated. -Patient stated she voices understanding of and agreed to seek help at an urgent care center. -Patient has greatly reduced her smoking to 1/2 pack daily, however would also benefit from smoking cessation -Patient is healthcare she would benefit from being seen in  person in our clinic and have her COPD management be reevaluated   Time spent during visit with patient: 13 minutes  Milus Banister, [place], PGY-2 12/09/2019 1:59 PM

## 2019-12-07 NOTE — Telephone Encounter (Signed)
Patient calls nurse line requesting rx for bronchitis. Patient reports productive cough, with green mucus. Patient also reports intermittent shortness of breath. Patient is not currently in any distress and is able to speak in complete sentences.   Scheduled virtual visit with Dr. Ouida Sills for tomorrow AM.   ED precautions given   To PCP  Talbot Grumbling, RN

## 2019-12-08 ENCOUNTER — Other Ambulatory Visit: Payer: Self-pay

## 2019-12-08 ENCOUNTER — Telehealth (INDEPENDENT_AMBULATORY_CARE_PROVIDER_SITE_OTHER): Payer: Medicaid Other | Admitting: Family Medicine

## 2019-12-08 DIAGNOSIS — R0602 Shortness of breath: Secondary | ICD-10-CM

## 2019-12-09 NOTE — Assessment & Plan Note (Signed)
As the differential for the patient shortness of breath is broad (including Covid, viral infection, bacterial infection, COPD exacerbation, pneumonia, PE, ?New onset heart failure) and this is a virtual visit, I strongly recommend patient be seen at an office & where she can be evaluated. -Patient stated she voices understanding of and agreed to seek help at an urgent care center. -Patient has greatly reduced her smoking to 1/2 pack daily, however would also benefit from smoking cessation -Patient is healthcare she would benefit from being seen in person in our clinic and have her COPD management be reevaluated

## 2019-12-12 ENCOUNTER — Ambulatory Visit: Payer: Medicaid Other | Admitting: Family Medicine

## 2019-12-13 ENCOUNTER — Ambulatory Visit: Payer: Medicaid Other

## 2019-12-19 ENCOUNTER — Institutional Professional Consult (permissible substitution): Payer: Medicaid Other | Admitting: Pulmonary Disease

## 2019-12-28 ENCOUNTER — Other Ambulatory Visit: Payer: Self-pay | Admitting: Family Medicine

## 2020-01-02 ENCOUNTER — Other Ambulatory Visit: Payer: Self-pay | Admitting: Family Medicine

## 2020-01-03 ENCOUNTER — Ambulatory Visit: Payer: Medicaid Other

## 2020-01-18 ENCOUNTER — Ambulatory Visit: Payer: Medicaid Other

## 2020-01-19 ENCOUNTER — Other Ambulatory Visit: Payer: Self-pay | Admitting: Neurology

## 2020-01-24 ENCOUNTER — Institutional Professional Consult (permissible substitution): Payer: Medicaid Other | Admitting: Pulmonary Disease

## 2020-01-30 ENCOUNTER — Telehealth: Payer: Self-pay

## 2020-01-30 NOTE — Telephone Encounter (Signed)
Pt called office and left a VM asking for a call back. She left no other details.

## 2020-01-31 ENCOUNTER — Other Ambulatory Visit: Payer: Self-pay | Admitting: Family Medicine

## 2020-01-31 ENCOUNTER — Ambulatory Visit: Payer: Medicaid Other | Admitting: Neurology

## 2020-01-31 NOTE — Telephone Encounter (Signed)
IF patient calls back please ask pt to give on details on what she was calling for.She left no details for her call.  Voice mail left for patient to call back about. I need to  Know what was the call for pt gave no details.

## 2020-02-24 ENCOUNTER — Other Ambulatory Visit: Payer: Self-pay | Admitting: Family Medicine

## 2020-03-01 ENCOUNTER — Telehealth: Payer: Self-pay

## 2020-03-01 NOTE — Telephone Encounter (Signed)
Contacted patient at listed phone number in order to discuss Covid vaccine safety.  Patient expressed several concerns for taking the vaccine due to her extensive medication allergy list.  Patient confirmed that she did not have any adverse reactions to her annual flu vaccine or any other vaccines in the past.  Shared decision making for the patient to hold off on Covid vaccination for now given her current concerns.  Patient was agreeable with this plan.

## 2020-03-01 NOTE — Telephone Encounter (Signed)
Patient calls nurse line to discuss safety of COVID vaccine. Patient states that she has multiple severe medication allergies. Patient declines allergic reaction to vaccines and reports receiving yearly flu vaccination.   Please advise  To PCP  Talbot Grumbling, RN

## 2020-03-05 ENCOUNTER — Institutional Professional Consult (permissible substitution): Payer: Medicaid Other | Admitting: Internal Medicine

## 2020-03-11 ENCOUNTER — Other Ambulatory Visit: Payer: Self-pay | Admitting: Neurology

## 2020-03-14 ENCOUNTER — Other Ambulatory Visit: Payer: Self-pay

## 2020-03-14 MED ORDER — VITAMIN D3 50 MCG (2000 UT) PO CAPS: 2000.0000 [IU] | ORAL_CAPSULE | Freq: Every day | ORAL | 2 refills | Status: DC

## 2020-03-14 MED ORDER — SIMVASTATIN 40 MG PO TABS: 40.0000 mg | ORAL_TABLET | Freq: Every day | ORAL | 2 refills | Status: DC

## 2020-03-14 MED ORDER — FLUTICASONE PROPIONATE 50 MCG/ACT NA SUSP: NASAL | 2 refills | Status: DC

## 2020-03-14 MED ORDER — ALBUTEROL SULFATE (2.5 MG/3ML) 0.083% IN NEBU: INHALATION_SOLUTION | RESPIRATORY_TRACT | 2 refills | Status: DC

## 2020-03-14 MED ORDER — DULERA 200-5 MCG/ACT IN AERO: INHALATION_SPRAY | RESPIRATORY_TRACT | 2 refills | Status: DC

## 2020-03-22 ENCOUNTER — Other Ambulatory Visit: Payer: Self-pay

## 2020-03-22 ENCOUNTER — Ambulatory Visit: Payer: Medicaid Other | Attending: Family Medicine | Admitting: Physical Therapy

## 2020-03-22 DIAGNOSIS — R208 Other disturbances of skin sensation: Secondary | ICD-10-CM

## 2020-03-22 DIAGNOSIS — G8929 Other chronic pain: Secondary | ICD-10-CM | POA: Diagnosis present

## 2020-03-22 DIAGNOSIS — R209 Unspecified disturbances of skin sensation: Secondary | ICD-10-CM | POA: Insufficient documentation

## 2020-03-22 DIAGNOSIS — M25512 Pain in left shoulder: Secondary | ICD-10-CM | POA: Diagnosis present

## 2020-03-22 DIAGNOSIS — M545 Low back pain: Secondary | ICD-10-CM | POA: Insufficient documentation

## 2020-03-22 DIAGNOSIS — R2681 Unsteadiness on feet: Secondary | ICD-10-CM | POA: Diagnosis present

## 2020-03-22 DIAGNOSIS — R262 Difficulty in walking, not elsewhere classified: Secondary | ICD-10-CM | POA: Insufficient documentation

## 2020-03-22 DIAGNOSIS — R296 Repeated falls: Secondary | ICD-10-CM | POA: Insufficient documentation

## 2020-03-22 DIAGNOSIS — M6281 Muscle weakness (generalized): Secondary | ICD-10-CM | POA: Insufficient documentation

## 2020-03-22 NOTE — Therapy (Addendum)
[facility] [address] [place], [place], [address] Phone: [phone]   Fax:  [phone]  Physical Therapy Evaluation  Patient Details  Name: [name] MRN: [id number] Date of Birth: 1955/11/26 Referring Provider (PT): Lind Covert, MD   Encounter Date: 03/22/2020   PT End of Session - 03/22/20 1133    Visit Number 1    Number of Visits 1   one visit for wheelchair evaluation only   Date for PT Re-Evaluation 03/22/20    Authorization Type [employer] Medicaid    PT Start Time 1030    PT Stop Time 1120    PT Time Calculation (min) 50 min    Activity Tolerance Patient tolerated treatment well    Behavior During Therapy [facility] for tasks assessed/performed           Past Medical History:  Diagnosis Date  . Allergic rhinitis   . Arthritis    "hands and legs and feet" (04/27/2012)  . Cancer ([place]) 05   rt arm mole rem cancer  . Chronic bronchitis ([place])    "keep it all the time" (04/27/2012)  . COPD (chronic obstructive pulmonary disease) ([place])   . Depression   . Diverticulosis   . DJD (degenerative joint disease)   . Emphysema   . Epilepsy ([place])   . Esophageal stricture   . Exertional dyspnea   . GERD (gastroesophageal reflux disease)   . Glaucoma   . H/O blood clots    Right leg  . Hyperlipidemia   . Hypertension   . IBS (irritable bowel syndrome)   . Migraine   . Neck pain    Cervical disc  . OSA on CPAP    6-7 yrs; pt wears CPAP but has not been able to use it lately due to sinus issues  . Pneumonia 2012; 04/27/2012  . Right leg DVT ([place]) 1982   groin  . Seizures ([place])   . Type II diabetes mellitus ([place])    No meds since weight loss  . UTI (urinary tract infection)    2 weeks ago  . Vertigo 10/24/2014    Past Surgical History:  Procedure Laterality Date  . ABDOMINAL HYSTERECTOMY  2001   "partial"  . CARPAL TUNNEL RELEASE  ~ 2008   right  . CATARACT EXTRACTION Bilateral   . COLONOSCOPY  WITH ESOPHAGOGASTRODUODENOSCOPY (EGD)    . KNEE ARTHROSCOPY  1990's   left  . NASAL SEPTOPLASTY W/ TURBINOPLASTY Bilateral 05/23/2016   Procedure: NASAL SEPTOPLASTY WITH TURBINATE REDUCTION;  Surgeon: Jerrell Belfast, MD;  Location: [facility] [facility];  Service: ENT;  Laterality: Bilateral;  . SHOULDER OPEN ROTATOR CUFF REPAIR  ~ 2010   left  . SINUS ENDO WITH FUSION Right 05/23/2016   Procedure: LIMITED RIGHT ENDOSCOPIC SINUS SURGERY;  Surgeon: Jerrell Belfast, MD;  Location: [place];  Service: ENT;  Laterality: Right;  . TOTAL KNEE ARTHROPLASTY  08/23/2012   right  . TOTAL KNEE ARTHROPLASTY  08/23/2012   Procedure: TOTAL KNEE ARTHROPLASTY;  Surgeon: Lorn Junes, MD;  Location: [facility];  Service: Orthopedics;  Laterality: Right;  RIGHT KNEE ARTHROPLASTY MEDIAL AND LATERAL COMPARTMENTS WITH PATELLA RESURFACING  . TUBAL LIGATION  2001    There were no vitals filed for this visit.    Subjective Assessment - 03/22/20 1128    Subjective Patient referred for evaluation for power mobility due to multiple falls and COPD.  Pt reports falling almost every other day due to changes in sensation and  significant LE weakness.    Pertinent History COPD, tobacco use, DJD, osteopenia, L shoulder pain, GERD, Vit D deficiency, incontinence, chronic bronchitis, depression, emphysema, esophageal stricture, glaucoma, h/o blood clots, HLD, HTN, IBS, migraine, OSA on CPAP, PNA, type 2 DM, vertigo, L rotator cuff repair, R carpal tunnel release, R TKA    Currently in Pain? Yes    Pain Location Back    Pain Orientation Lower    Pain Descriptors / Indicators Sore;Aching    Pain Type Chronic pain              [facility] PT Assessment - 03/22/20 1131      Assessment   Medical Diagnosis Weakness and Shortness of Breath    Referring Provider (PT) Lind Covert, MD    Onset Date/Surgical Date 11/30/19      Balance Screen   Has the patient fallen in the past 6 months Yes    How many times? multiple, every other day     Has the patient had a decrease in activity level because of a fear of falling?  Yes    Is the patient reluctant to leave their home because of a fear of falling?  Yes      Prior Function   Level of Independence Independent with transfers;Requires assistive device for independence;Needs assistance with homemaking              Mobility/Seating Evaluation    PATIENT INFORMATION: Name: [name] DOB: 1955-10-16  Sex: Female Date seen: 03/22/2020 Time: 10:15  Address:  [address]  [place] [address] Physician: Lind Covert, MD This evaluation/justification form will serve as the LMN for the following suppliers: __________________________ Supplier: [employer] Contact Person: Luz Brazen, ATP Phone:  ?????   Seating Therapist: Misty Stanley, PT, DPT Phone:   [phone]   Phone: [phone]     Spouse/Parent/Caregiver name: Alain Marion Boulden`  Phone number: [phone] Insurance/Payer: [facility] Medicaid     Reason for Referral: To obtain power mobility  Patient/Caregiver Goals: To prevent falls  Patient was seen for face-to-face evaluation for new power wheelchair.  Also present was [name], ATP to discuss recommendations and wheelchair options.  Further paperwork was completed and sent to vendor.  Patient appears to qualify for power mobility device at this time per objective findings.   MEDICAL HISTORY: Diagnosis: Primary Diagnosis: COPD Onset: 10 years ago Diagnosis: Intractable Epilepsy   '[]' Progressive Disease Relevant past and future surgeries: R carpal tunnel surgery, R TKA, L shoulder rotator cuff repair, cataract extraction; needs L TKA   Height: 5'1" Weight: 190 lb Explain recent changes or trends in weight: None   History including Falls: Reports falling every other day legs give out or leg goes numb; PMH includes:  COPD, tobacco use, DJD, osteopenia, L shoulder pain, GERD, Vit D deficiency, incontinence, chronic bronchitis, depression, emphysema, esophageal  stricture, glaucoma, h/o blood clots, HLD, HTN, IBS, migraine, OSA on CPAP, PNA, type 2 DM, vertigo    HOME ENVIRONMENT: '[x]' House  '[]' Condo/town home  '[]' Apartment  '[]' Assisted Living    '[]' Lives Alone '[x]'  Lives with Others  Hours with caregiver: ?????  '[x]' Home is accessible to patient           Stairs      '[x]' Yes '[]'  No     Ramp '[x]' Yes '[]' No Comments:  Living wth husband, son and his wife; ramp on back entrance - have to go through grass to reach.  Plan to extend ramp to driveway to improve access to ramp and prevent wheelchair from getting stuck in soft/wet ground.  One level house.  Hardwood floors.  Wide doorways except for bathroom     COMMUNITY ADL: TRANSPORTATION: '[]' Car    '[x]' Van    '[]' Public Transportation    '[]' Adapted w/c Lift    '[]' Ambulance    '[]' Other:       '[]' Sits in wheelchair during transport  Employment/School: ????? Specific requirements pertaining to mobility ?????  Other: Son provides transportation.  Drives a Multimedia programmer and son's wife has a truck.  Son has a ramp and trailer for truck.     FUNCTIONAL/SENSORY PROCESSING SKILLS:  Handedness:   '[x]' Right     '[]' Left    '[]' NA  Comments:  ?????  Functional Processing Skills for Wheeled Mobility '[x]' Processing Skills are adequate for safe wheelchair operation  Areas of concern than may interfere with safe operation of wheelchair Description of problem   '[]'  Attention to environment      '[]' Judgment      '[]'  Hearing  '[]'  Vision or visual processing      '[]' Motor Planning  '[]'  Fluctuations in Behavior  ?????    VERBAL COMMUNICATION: '[x]' WFL receptive '[x]'  WFL expressive '[]' Understandable  '[]' Difficult to understand  '[]' non-communicative '[]'  Uses an augmented communication device  CURRENT SEATING / MOBILITY: Current Mobility Base:  '[x]' None '[]' Dependent '[]' Manual '[]' Scooter '[]' Power  Type of Control: ?????  Manufacturer:  ?????Size:  ?????Age: ?????  Current Condition of  Mobility Base:  ?????   Current Wheelchair components:  ?????  Describe posture in present seating system:  ?????      SENSATION and SKIN ISSUES: Sensation '[]' Intact  '[x]' Impaired '[]' Absent  Level of sensation: Pt reports decreased sensation to light touch in bilat UE and LE, has history of R carpal tunnel release Pressure Relief: Able to perform effective pressure relief :    '[x]' Yes  '[]'  No Method: Standing If not, Why?: ?????  Skin Issues/Skin Integrity Current Skin Issues  '[]' Yes '[x]' No '[x]' Intact '[]'  Red area'[]'  Open Area  '[]' Scar Tissue '[]' At risk from prolonged sitting Where  ?????  History of Skin Issues  '[]' Yes '[x]' No Where  ????? When  ?????  Hx of skin flap surgeries  '[]' Yes '[x]' No Where  ????? When  ?????  Limited sitting tolerance '[]' Yes '[]' No Hours spent sitting in wheelchair daily: Spends most of the day in the bed or on the couch  Complaint of Pain:  Please describe: Left Shoulder, low back and bilateral hip pain   Swelling/Edema: Patient reports edema "all the time" but none observed on lower legs, mild edema in L knee   ADL STATUS (in reference to wheelchair use):  Indep Assist Unable Indep with Equip Not assessed Comments  Dressing '[]'  '[]'  '[]'  '[x]'  '[]'  performs sitting edge of bed  Eating '[]'  '[]'  '[]'  '[x]'  '[]'  eats either in bedroom or on couch due to difficulty getting to kitchen  Toileting '[]'  '[]'  '[]'  '[x]'  '[]'  uses rollator and husband supervises patient to bathroom  Bathing '[]'  '[]'  '[]'  '[x]'  '[]'  Tub shower, has grab bars and shower seat  Grooming/Hygiene '[]'  '[]'  '[]'  '[x]'  '[]'  Seated at sink  Meal Prep '[]'  '[]'  '[x]'  '[]'  '[]'  ?????  IADLS '[]'  '[]'  '[x]'  '[]'  '[]'  ?????  Bowel Management: '[x]' Continent  '[]' Incontinent  '[]' Accidents Comments:  ?????  Bladder Management: '[]' Continent  '[x]' Incontinent  '[]' Accidents Comments:  if not able to make it to the bathroom in time     WHEELCHAIR SKILLS: Manual w/c Propulsion: '[]' UE or LE strength and endurance sufficient to participate in ADLs using manual wheelchair Arm : '[]' left  '[]' right   '[]' Both      Distance: ????? Foot:  '[]' left '[]' right   '[]' Both  Operate Scooter: '[]'  Strength, hand grip, balance and transfer appropriate for use '[]' Living environment is accessible for use of scooter  Operate Power w/c:  '[x]'  Std. Joystick   '[]'  Alternative Controls Indep '[x]'  Assist '[]'  Dependent/unable '[]'  N/A '[]'   '[x]' Safe          '[x]'  Functional      Distance: community  Bed confined without wheelchair '[]'  Yes '[x]'  No   STRENGTH/RANGE OF MOTION:  Active Range of Motion Strength  Shoulder Flexion to 100 deg, pain in L shoulder 3-/5  Elbow WFL 4/5  Wrist/Hand WFL 4/5  Hip Hip flexion limited by weakness R 3/5, L 2/5  Knee WFL 3+/5  Ankle WFL 3/5     MOBILITY/BALANCE:  '[]'  Patient is totally dependent for mobility  ?????    Balance Transfers Ambulation  Sitting Balance: Standing Balance: '[]'  Independent '[]'  Independent/Modified Independent  '[]'  WFL     '[]'  WFL '[x]'  Supervision '[x]'  Supervision  '[x]'  Uses UE for balance  '[x]'  Supervision '[]'  Min Assist '[]'  Ambulates with Assist  ?????    '[]'  Min Assist '[]'  Min assist '[]'  Mod Assist '[x]'  Ambulates with Device:      '[]'  RW  '[]'  StW  '[]'  Cane  '[x]'  rollator with seat; uses seat on rollator for rest break but has experienced falls off rollator   '[]'  Mod Assist '[]'  Mod assist '[]'  Max assist   '[]'  Max Assist '[]'  Max assist '[]'  Dependent '[]'  Indep. Short Distance Only  '[]'  Unable '[]'  Unable '[]'  Lift / Sling Required Distance (in feet)  ?????   '[]'  Sliding board '[]'  Unable to Ambulate (see explanation below)  Cardio Status:  '[x]' Intact  '[]'  Impaired   '[]'  NA     ?????  Respiratory Status:  '[]' Intact   '[x]' Impaired   '[]' NA     COPD, OSA  Orthotics/Prosthetics: ?????  Comments (Address manual vs power w/c vs scooter): [name] has a mobility deficit which cannot be remediated with a cane or walker due to significant lower extremity weakness, pulmonary impairments and impaired sensation.  [name] has experienced multiple falls when performing transfers or ambulating very short  distances with a rolling 4-wheeled walker.  [name] reports experiencing a fall almost every other day.  She requires supervision when ambulating short household distances and has limited her household mobility to the bathroom for toileting and showering due to the frequency of falls.  A Timed Up and Go was performed during the wheelchair evaluation with [name] performing the TUG in 20.75 seconds with a rollator; her oxygen saturation was read at 95% before and after the TUG.  A time greater than 13.5 seconds is indicative of high risk for falls.  [name] has also experienced falls off the rolling walker seat when using for rest breaks.  [name] is unable to propel any type of manual wheelchair because she lacks sufficient shoulder range of motion and left shoulder pain due to previous rotator cuff repair, has  impaired sensation, history of carpal tunnel release and significant pulmonary impairments due to COPD.  [name] is not able to safely operate a power scooter (POV) due to lack of shoulder strength to drive a tiller style propulsion system.  She also lacks sufficient standing balance to safely transfer on to and off a power scooter.  [name] requires the use of a group 2 power wheelchair for independent household mobility, to safely perform her MRADLs, for energy conservation and to reduce her risk for falls?????         Anterior / Posterior Obliquity Rotation-Pelvis Weight shifted to R hip, off weighting L hip due to pain  PELVIS    '[]'  '[]'  '[x]'   Neutral Posterior Anterior  '[]'  '[]'  '[x]'   WFL Rt elev Lt elev  '[x]'  '[]'  '[]'   WFL Right Left                      Anterior    Anterior     '[]'  Fixed '[]'  Other '[x]'  Partly Flexible '[]'  Flexible   '[]'  Fixed '[]'  Other '[x]'  Partly Flexible  '[]'  Flexible  '[]'  Fixed '[]'  Other '[]'  Partly Flexible  '[]'  Flexible   TRUNK  '[]'  '[x]'  '[]'   WFL ? Thoracic ? Lumbar  Kyphosis Lordosis  '[]'  '[x]'  '[]'   WFL Convex Convex  Right Left '[x]' c-curve '[]' s-curve '[]' multiple  '[x]'  Neutral '[]'  Left-anterior '[]'   Right-anterior     '[]'  Fixed '[]'  Flexible '[x]'  Partly Flexible '[]'  Other  '[]'  Fixed '[]'  Flexible '[x]'  Partly Flexible '[]'  Other  '[]'  Fixed             '[]'  Flexible '[]'  Partly Flexible '[]'  Other    Position Windswept  ?????  HIPS          '[x]'            '[]'               '[]'    Neutral       Abduct        ADduct         '[x]'           '[]'            '[]'   Neutral Right           Left      '[]'  Fixed '[]'  Subluxed '[]'  Partly Flexible '[]'  Dislocated '[]'  Flexible  '[]'  Fixed '[]'  Other '[]'  Partly Flexible  '[]'  Flexible                 Foot Positioning Knee Positioning  ?????    '[x]'  WFL  '[]' Lt '[]' Rt '[x]'  WFL  '[]' Lt '[]' Rt    KNEES ROM concerns: ROM concerns:    & Dorsi-Flexed '[]' Lt '[]' Rt ?????    FEET Plantar Flexed '[]' Lt '[]' Rt      Inversion                 '[]' Lt '[]' Rt      Eversion                 '[]' Lt '[]' Rt     HEAD '[x]'  Functional '[x]'  Good Head Control  ?????  & '[]'  Flexed         '[]'  Extended '[]'  Adequate Head Control    NECK '[]'  Rotated  Lt  '[]'  Lat Flexed Lt '[]'  Rotated  Rt '[]'  Lat Flexed Rt '[]'  Limited Head Control     '[]'  Cervical Hyperextension '[]'  Absent  Head Control     SHOULDERS ELBOWS  WRIST& HAND ?????      Left     Right    Left     Right    Left     Right   U/E '[]' Functional           '[]' Functional WFL WFL '[]' Fisting             '[]' Fisting      '[]' elev   '[x]' dep      '[x]' elev   '[]' dep       '[]' pro -'[]' retract     '[]' pro  '[]' retract '[]' subluxed             '[]' subluxed           Goals for Wheelchair Mobility  '[x]'  Independence with mobility in the home with motor related ADLs (MRADLs)  '[x]'  Independence with MRADLs in the community '[]'  Provide dependent mobility  '[]'  Provide recline     '[]' Provide tilt   Goals for Seating system '[x]'  Optimize pressure distribution '[x]'  Provide support needed to facilitate function or safety '[]'  Provide corrective forces to assist with maintaining or improving posture '[]'  Accommodate client's posture:   current seated postures and positions are not flexible or will not tolerate corrective forces '[]'   Client to be independent with relieving pressure in the wheelchair '[x]' Enhance physiological function such as breathing, swallowing, digestion  Simulation ideas/Equipment trials:????? State why other equipment was unsuccessful:?????   MOBILITY BASE RECOMMENDATIONS and JUSTIFICATION: MOBILITY COMPONENT JUSTIFICATION  Manufacturer: Theatre manager: [employer]-wheel drive   Size: Width 18 Seat Depth 18 '[x]' provide transport from point A to B      '[x]' promote Indep mobility  '[x]' is not a safe, functional ambulator '[x]' walker or cane inadequate '[]' non-standard width/depth necessary to accommodate anatomical measurement '[]'  ?????  '[]' Manual Mobility Base '[]' non-functional ambulator    '[]' Scooter/POV  '[]' can safely operate  '[]' can safely transfer   '[]' has adequate trunk stability  '[]' cannot functionally propel manual w/c  '[x]' Power Mobility Base  '[]' non-ambulatory  '[x]' cannot functionally propel manual wheelchair  '[x]'  cannot functionally and safely operate scooter/POV '[x]' can safely operate and willing to  '[]' Stroller Base '[]' infant/child  '[]' unable to propel manual wheelchair '[]' allows for growth '[]' non-functional ambulator '[]' non-functional UE '[]' Indep mobility is not a goal at this time  '[]' Tilt  '[]' Forward '[]' Backward '[]' Powered tilt  '[]' Manual tilt  '[]' change position against gravitational force on head and shoulders  '[]' change position for pressure relief/cannot weight shift '[]' transfers  '[]' management of tone '[]' rest periods '[]' control edema '[]' facilitate postural control  '[]'  ?????  '[]' Recline  '[]' Power recline on power base '[]' Manual recline on manual base  '[]' accommodate femur to back angle  '[]' bring to full recline for ADL care  '[]' change position for pressure relief/cannot weight shift '[]' rest periods '[]' repositioning for transfers or clothing/diaper /catheter changes '[]' head positioning  '[]' Lighter weight required '[]' self- propulsion  '[]' lifting '[]'  ?????  '[]' Heavy Duty required '[]' user weight greater than 250# '[]' extreme  tone/ over active movement '[]' broken frame on previous chair '[]'  ?????  '[x]'  Back  '[]'  Angle Adjustable '[]'  Custom molded Captain's seat back '[]' postural control '[]' control of tone/spasticity '[]' accommodation of range of motion '[]' UE functional control '[]' accommodation for seating system '[]'  ????? '[]' provide lateral trunk support '[]' accommodate deformity '[x]' provide posterior trunk support '[x]' provide lumbar/sacral support '[x]' support trunk in midline '[x]' Pressure relief over spinal processes  '[x]'  Seat Cushion Captain's seat '[x]' impaired sensation  '[]' decubitus ulcers present '[]' history of pressure ulceration '[]' prevent pelvic extension '[x]' low maintenance  '[x]' stabilize pelvis  '[]' accommodate obliquity '[]' accommodate multiple deformity '[]' neutralize lower extremity position '[]' increase pressure distribution '[]'  ?????  '[]'  Pelvic/thigh support  '[]'   Lateral thigh guide '[]'  Distal medial pad  '[]'  Distal lateral pad '[]'  pelvis in neutral '[]' accommodate pelvis '[]'  position upper legs '[]'  alignment '[]'  accommodate ROM '[]'  decr adduction '[]' accommodate tone '[]' removable for transfers '[]' decr abduction  '[]'  Lateral trunk Supports '[]'  Lt     '[]'  Rt '[]' decrease lateral trunk leaning '[]' control tone '[]' contour for increased contact '[]' safety  '[]' accommodate asymmetry '[]'  ?????  '[]'  Mounting hardware  '[]' lateral trunk supports  '[]' back   '[]' seat '[]' headrest      '[]'  thigh support '[]' fixed   '[]' swing away '[]' attach seat platform/cushion to w/c frame '[]' attach back cushion to w/c frame '[]' mount postural supports '[]' mount headrest  '[]' swing medial thigh support away '[]' swing lateral supports away for transfers  '[]'  ?????    Armrests  '[]' fixed '[x]' adjustable height '[]' removable   '[]' swing away  '[x]' flip back   '[]' reclining '[x]' full length pads '[]' desk    '[]' pads tubular  '[x]' provide support with elbow at 90   '[]' provide support for w/c tray '[x]' change of height/angles for variable activities '[x]' remove for transfers '[]' allow to come closer to table  top '[x]' remove for access to tables '[]'  ?????  Hangers/ Leg rests  '[]' 60 '[]' 70 '[]' 90 '[]' elevating '[]' heavy duty  '[]' articulating '[]' fixed '[]' lift off '[]' swing away     '[]' power '[]' provide LE support  '[]' accommodate to hamstring tightness '[]' elevate legs during recline   '[]' provide change in position for Legs '[]' Maintain placement of feet on footplate '[]' durability '[]' enable transfers '[]' decrease edema '[]' Accommodate lower leg length '[]'  ?????  Foot support Footplate    '[]' Lt  '[]'  Rt  '[x]'  Center mount '[x]' flip up     '[]' depth/angle adjustable '[]' Amputee adapter    '[]'  Lt     '[]'  Rt '[x]' provide foot support '[]' accommodate to ankle ROM '[x]' transfers '[]' Provide support for residual extremity '[]'  allow foot to go under wheelchair base '[]'  decrease tone  '[]'  ?????  '[]'  Ankle strap/heel loops '[]' support foot on foot support '[]' decrease extraneous movement '[]' provide input to heel  '[]' protect foot  Tires: '[x]' pneumatic  '[x]' flat free inserts  '[]' solid  '[x]' decrease maintenance  '[x]' prevent frequent flats '[]' increase shock absorbency '[]' decrease pain from road shock '[]' decrease spasms from road shock '[]'  ?????  '[]'  Headrest  '[]' provide posterior head support '[]' provide posterior neck support '[]' provide lateral head support '[]' provide anterior head support '[]' support during tilt and recline '[]' improve feeding   '[]' improve respiration '[]' placement of switches '[]' safety  '[]' accommodate ROM  '[]' accommodate tone '[]' improve visual orientation  '[]'  Anterior chest strap '[]'  Vest '[]'  Shoulder retractors  '[]' decrease forward movement of shoulder '[]' accommodation of TLSO '[]' decrease forward movement of trunk '[]' decrease shoulder elevation '[]' added abdominal support '[]' alignment '[]' assistance with shoulder control  '[]'  ?????  Pelvic Positioner '[x]' Belt '[]' SubASIS bar '[]' Dual Pull '[]' stabilize tone '[x]' decrease falling out of chair/ **will not Decr potential for sliding due to pelvic tilting '[]' prevent excessive rotation '[]' pad for protection over boney  prominence '[]' prominence comfort '[]' special pull angle to control rotation '[]'  ?????  Upper Extremity Support '[]' L   '[]'  R '[]' Arm trough    '[]' hand support '[]'  tray       '[]' full tray '[]' swivel mount '[]' decrease edema      '[]' decrease subluxation   '[]' control tone   '[]' placement for AAC/Computer/EADL '[]' decrease gravitational pull on shoulders '[]' provide midline positioning '[]' provide support to increase UE function '[]' provide hand support in natural position '[]' provide work surface   POWER WHEELCHAIR CONTROLS  '[x]' Proportional  '[]' Non-Proportional Type Joystick '[]' Left  '[x]' Right '[x]' provides access for controlling wheelchair   '[]' lacks motor control to operate proportional drive control '[]' unable to understand proportional controls  Actuator Control Module  '[]'   Single  '[]' Multiple   '[]' Allow the client to operate the power seat function(s) through the joystick control   '[]' Safety Reset Switches '[]' Used to change modes and stop the wheelchair when driving in latch mode    '[]' Upgraded Electronics   '[]' programming for accurate control '[]' progressive Disease/changing condition '[]' non-proportional drive control needed '[]' Needed in order to operate power seat functions through joystick control   '[]' Display box '[]' Allows user to see in which mode and drive the wheelchair is set  '[]' necessary for alternate controls    '[]' Digital interface electronics '[]' Allows w/c to operate when using alternative drive controls  '[]' ASL Head Array '[]' Allows client to operate wheelchair  through switches placed in tri-panel headrest  '[]' Sip and puff with tubing kit '[]' needed to operate sip and puff drive controls  '[]' Upgraded tracking electronics '[]' increase safety when driving '[]' correct tracking when on uneven surfaces  '[x]' Mount for switches or joystick '[x]' Attaches switches to w/c  '[x]' Swing away for access or transfers '[]' midline for optimal placement '[]' provides for consistent access  '[]' Attendant controlled joystick plus mount '[]' safety '[]' long  distance driving '[]' operation of seat functions '[]' compliance with transportation regulations '[]'  ?????    Rear wheel placement/Axle adjustability '[]' None '[]' semi adjustable '[]' fully adjustable  '[]' improved UE access to wheels '[]' improved stability '[]' changing angle in space for improvement of postural stability '[]' 1-arm drive access '[]' amputee pad placement '[]'  ?????  Wheel rims/ hand rims  '[]' metal  '[]' plastic coated '[]' oblique projections '[]' vertical projections '[]' Provide ability to propel manual wheelchair  '[]'  Increase self-propulsion with hand weakness/decreased grasp  Push handles '[]' extended  '[]' angle adjustable  '[]' standard '[]' caregiver access '[]' caregiver assist '[]' allows "hooking" to enable increased ability to perform ADLs or maintain balance  One armed device  '[]' Lt   '[]' Rt '[]' enable propulsion of manual wheelchair with one arm   '[]'  ?????   Brake/wheel lock extension '[]'  Lt   '[]'  Rt '[]' increase indep in applying wheel locks   '[]' Side guards '[]' prevent clothing getting caught in wheel or becoming soiled '[]'  prevent skin tears/abrasions  Battery: U1 x 2 '[x]' to power wheelchair ?????  Other: ????? ????? ?????  The above equipment has a life- long use expectancy. Growth and changes in medical and/or functional conditions would be the exceptions. This is to certify that the therapist has no financial relationship with durable medical provider or manufacturer. The therapist will not receive remuneration of any kind for the equipment recommended in this evaluation.   Patient has mobility limitation that significantly impairs safe, timely participation in one or more mobility related ADL's.  (bathing, toileting, feeding, dressing, grooming, moving from room to room)                                                             '[x]'  Yes '[]'  No Will mobility device sufficiently improve ability to participate and/or be aided in participation of MRADL's?         '[x]'  Yes '[]'  No Can limitation be compensated for with use of a  cane or walker?                                                                                '[]'   Yes '[x]'  No Does patient or caregiver demonstrate ability/potential ability & willingness to safely use the mobility device?   '[x]'  Yes '[]'  No Does patient's home environment support use of recommended mobility device?                                                    '[x]'  Yes '[]'  No Does patient have sufficient upper extremity function necessary to functionally propel a manual wheelchair?    '[]'  Yes '[x]'  No Does patient have sufficient strength and trunk stability to safely operate a POV (scooter)?                                  '[]'  Yes '[x]'  No Does patient need additional features/benefits provided by a power wheelchair for MRADL's in the home?       '[x]'  Yes '[]'  No Does the patient demonstrate the ability to safely use a power wheelchair?                                                              '[x]'  Yes '[]'  No  Therapist Name Printed: Misty Stanley, PT, DPT Date: 03/22/2020  Therapist's Signature:   Date:   Supplier's Name Printed: [employer] Date: 03/22/2020  Supplier's Signature:   Date:  Patient/Caregiver Signature:   Date:     This is to certify that I have read this evaluation and do agree with the content within:      Physician's Name Printed: Lind Covert, MD  Physician's Signature:  Date:     This is to certify that I, the above signed therapist have the following affiliations: '[]'  This DME provider '[]'  Manufacturer of recommended equipment '[]'  Patient's long term care facility '[x]'  None of the above         Objective measurements completed on examination: See above findings.               PT Education - 03/22/20 1132    Education Details Process for obtaining power wheelchair; wheelchair recommendations    Person(s) Educated Patient    Methods Explanation    Comprehension Verbalized understanding                       Plan - 03/22/20  1133    Clinical Impression Statement Pt is a 64 year old female referred to Neuro OPPT for evaluation for power mobility due to falls and COPD.  Pt's PMH is significant for the following: COPD, tobacco use, DJD, osteopenia, L shoulder pain, GERD, Vit D deficiency, incontinence, chronic bronchitis, depression, emphysema, esophageal stricture, glaucoma, h/o blood clots, HLD, HTN, IBS, migraine, OSA on CPAP, PNA, type 2 DM, vertigo, L rotator cuff repair, R carpal tunnel release, R TKA. The following deficits were noted during pt's exam: impaired UE ROM, impaired UE and LE functional strength, impaired cardiopulmonary endurance, impaired posture and balance, difficulty walking and pain in L shoulder, low back and LE.  Pt's TUG score indicates pt is at high risk for falls. Pt would benefit from a  group 2 power wheelchair to improve patient's independence with household mobility, independence with MRADL and decrease falls risk.    Personal Factors and Comorbidities Comorbidity 3+;Fitness;Past/Current Experience    Comorbidities COPD, tobacco use, DJD, osteopenia, L shoulder pain, GERD, Vit D deficiency, incontinence, chronic bronchitis, depression, emphysema, esophageal stricture, glaucoma, h/o blood clots, HLD, HTN, IBS, migraine, OSA on CPAP, PNA, type 2 DM, vertigo, L rotator cuff repair, R carpal tunnel release, R TKA    Examination-Activity Limitations Hygiene/Grooming;Locomotion Level;Toileting    Examination-Participation Restrictions Community Activity;Laundry;Meal Prep    Stability/Clinical Decision Making Evolving/Moderate complexity    Clinical Decision Making Moderate    Rehab Potential Good    PT Frequency One time visit    PT Duration Other (comment)   one time visit for wheelchair evaluation   Consulted and Agree with Plan of Care Patient           Patient will benefit from skilled therapeutic intervention in order to improve the following deficits and impairments:  Cardiopulmonary status  limiting activity, Decreased activity tolerance, Decreased balance, Decreased endurance, Decreased range of motion, Decreased strength, Difficulty walking, Impaired sensation, Impaired UE functional use, Postural dysfunction, Pain  Visit Diagnosis: Repeated falls  Muscle weakness (generalized)  Other disturbances of skin sensation  Difficulty in walking, not elsewhere classified  Unsteadiness on feet  Chronic left shoulder pain  Chronic midline low back pain without sciatica     Problem List Patient Active Problem List   Diagnosis Date Noted  . Shortness of breath 07/26/2019  . Tobacco use disorder 07/02/2018  . Elevated serum creatinine 06/01/2018  . Weakness 05/15/2017  . Chronic bronchitis ([place]) 01/14/2017  . Claudication ([place]) 04/09/2016  . Seizure disorder ([place]) 11/12/2015  . 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 Chronic obstructive pulmonary disease ([name]) 04/25/2015  . Pulmonary hypertension ([place]) 08/06/2013  . Diastolic CHF ([place]) 82/99/3716  . BPPV (benign paroxysmal positional vertigo) 03/09/2013  . Chronic respiratory failure ([place]) 11/01/2012  . Left shoulder pain 06/27/2012  . ANXIETY 02/13/2010  . Obstructive sleep apnea 02/19/2009  . Vitamin D deficiency 01/21/2008  . OSTEOPENIA 11/24/2007  . Irritable bowel syndrome 11/17/2007  . ALLERGIC RHINITIS 09/24/2007  . Hyperlipidemia 10/08/2006  . OBESITY, NOS  BMI 33.8 10/08/2006  . GLAUCOMA 10/08/2006  . GASTROESOPHAGEAL REFLUX, NO ESOPHAGITIS 10/08/2006  . HERNIA, HIATAL, NONCONGENITAL 10/08/2006  . INCONTINENCE, STRESS, FEMALE 10/08/2006  . DJD (degenerative joint disease) of knee 10/08/2006    Rico Junker, PT, DPT 03/22/20    11:41 AM    [facility] [address] [place], [place], [address] Phone: [phone]   Fax:  [phone]  Name: RAMISA DUMAN MRN: [id number] Date of Birth:  02-21-1956

## 2020-03-26 ENCOUNTER — Telehealth: Payer: Self-pay | Admitting: *Deleted

## 2020-03-26 NOTE — Telephone Encounter (Signed)
[employer] PA, U83.475, failed: triptans -side effects, nortriptyline- side effects, Ajovy, Botox. The answers to the authorization questions are being sent to the [employer] now.

## 2020-03-26 NOTE — Telephone Encounter (Signed)
[employer] approved by [facility] [place], through 03/26/2021. Approval faxed to [place].

## 2020-03-27 ENCOUNTER — Ambulatory Visit (INDEPENDENT_AMBULATORY_CARE_PROVIDER_SITE_OTHER): Payer: Medicaid Other | Admitting: Family Medicine

## 2020-03-27 ENCOUNTER — Encounter: Payer: Self-pay | Admitting: Family Medicine

## 2020-03-27 ENCOUNTER — Other Ambulatory Visit: Payer: Self-pay

## 2020-03-27 VITALS — BP 94/60 | HR 94 | Ht 61.0 in | Wt 202.0 lb

## 2020-03-27 DIAGNOSIS — J302 Other seasonal allergic rhinitis: Secondary | ICD-10-CM | POA: Diagnosis not present

## 2020-03-27 DIAGNOSIS — M545 Low back pain, unspecified: Secondary | ICD-10-CM

## 2020-03-27 DIAGNOSIS — G8929 Other chronic pain: Secondary | ICD-10-CM

## 2020-03-27 DIAGNOSIS — J441 Chronic obstructive pulmonary disease with (acute) exacerbation: Secondary | ICD-10-CM

## 2020-03-27 DIAGNOSIS — J449 Chronic obstructive pulmonary disease, unspecified: Secondary | ICD-10-CM

## 2020-03-27 DIAGNOSIS — E782 Mixed hyperlipidemia: Secondary | ICD-10-CM

## 2020-03-27 DIAGNOSIS — M25512 Pain in left shoulder: Secondary | ICD-10-CM

## 2020-03-27 DIAGNOSIS — E559 Vitamin D deficiency, unspecified: Secondary | ICD-10-CM | POA: Diagnosis not present

## 2020-03-27 MED ORDER — OMEPRAZOLE 40 MG PO CPDR: 40.0000 mg | DELAYED_RELEASE_CAPSULE | Freq: Every day | ORAL | 3 refills | Status: DC

## 2020-03-27 MED ORDER — CALCIUM CITRATE 950 (200 CA) MG PO TABS: 1.0000 | ORAL_TABLET | Freq: Two times a day (BID) | ORAL | 3 refills | Status: DC

## 2020-03-27 MED ORDER — LORATADINE 10 MG PO TABS: 10.0000 mg | ORAL_TABLET | Freq: Every day | ORAL | 2 refills | Status: DC

## 2020-03-27 MED ORDER — FLUTICASONE PROPIONATE 50 MCG/ACT NA SUSP: NASAL | 2 refills | Status: DC

## 2020-03-27 MED ORDER — FERROUS SULFATE 325 (65 FE) MG PO TABS: 325.0000 mg | ORAL_TABLET | Freq: Every day | ORAL | 2 refills | Status: DC

## 2020-03-27 MED ORDER — SERTRALINE HCL 50 MG PO TABS: 50.0000 mg | ORAL_TABLET | Freq: Every day | ORAL | 2 refills | Status: DC

## 2020-03-27 MED ORDER — GUAIFENESIN ER 600 MG PO TB12: 1200.0000 mg | ORAL_TABLET | Freq: Two times a day (BID) | ORAL | 0 refills | Status: DC | PRN

## 2020-03-27 MED ORDER — MELOXICAM 15 MG PO TABS: 15.0000 mg | ORAL_TABLET | Freq: Every day | ORAL | 1 refills | Status: DC

## 2020-03-27 MED ORDER — VITAMIN D3 50 MCG (2000 UT) PO CAPS: 2000.0000 [IU] | ORAL_CAPSULE | Freq: Every day | ORAL | 2 refills | Status: DC

## 2020-03-27 MED ORDER — SIMVASTATIN 40 MG PO TABS: 40.0000 mg | ORAL_TABLET | Freq: Every day | ORAL | 2 refills | Status: DC

## 2020-03-27 NOTE — Patient Instructions (Signed)
It was a pleasure to see you today!  Thank you for choosing [facility] for your primary care.  [facility] was seen for medication managment.   Our plans for today were:  We have reviewed your medications and I have sent refills for you.   Please continue to follow up with your neurologist and pulmonologist.   I also recommend discussing the covid vaccine with your pulmonologist during your upcoming visit.   We will do blood work to check your cholesterol and I will follow up with you for any abnormal results.   To keep you healthy, please keep in mind the following health maintenance items that you are due for:   1. COVID-Vaccine 2.  Eye Exam    You should return to our clinic in 4-6 weeks for follow up.   Best Wishes,   Dr. Alba Cory

## 2020-03-27 NOTE — Progress Notes (Signed)
    SUBJECTIVE:   CHIEF COMPLAINT / HPI:  med refills   Patient states that she is doing well and is here today for medication refills.  She reports that she will be seeing her neurologist later this month as well as her pulmonologist on 27 August.  Patient is inquiring about the Covid vaccine.  She states that she has so many drug allergies that she is concerned she will have a reaction to the Covid vaccine. Patient reports that she has been having virtual visits since the pandemic began due to concern for any exposure. She is requesting refills for her Prilosec, Mucinex, Flonase, Claritin, vitamin D, calcium supplement, meloxicam as well as Zoloft.  She denies any problems with taking her medications at home. Patient continues to smoke tobacco and states that she is decreased to 1.5 packs/day from 2 packs/day previously. Recommended smoking cessation in order to help with COPD. Patient and grandson verbalized understanding.   PERTINENT  PMH / PSH:  COPD GERD Allergic rhinitis Vitamin D deficiency Hyperlipidemia Left shoulder pain  OBJECTIVE:   BP 94/60   Pulse 94   Ht 5\' 1"  (1.549 m)   Wt 202 lb (91.6 kg)   SpO2 96%   BMI 38.17 kg/m    General: Chronically ill-appearing female stated age in no acute distress, seated in wheelchair Cardio: Normal S1 and S2, no S3 or S4. Rhythm is regular.   Pulm: Patient with bilateral and diffuse wheezing, patient on room air Abdomen: Bowel sounds normal. Abdomen soft and non-tender.  Neuro: pt alert and oriented x4  ASSESSMENT/PLAN:   Allergic rhinitis Continue Flonase, Claritin   Chronic obstructive pulmonary disease (HCC) Continue inhalers as prescribed  Patient to follow up with pulmonology as scheduled later this month   Vitamin D deficiency Continue Vitamin D supplementation   Hyperlipidemia Lipid panel ordered, patient unable to have blood drawn today due to decreased water intake.  Will return for lipid panel at later date.    Continue Zocor    Left shoulder pain Continue meloxicam     Eulis Foster, MD [place]

## 2020-03-29 ENCOUNTER — Other Ambulatory Visit: Payer: Self-pay | Admitting: Neurology

## 2020-03-29 NOTE — Assessment & Plan Note (Signed)
Continue inhalers as prescribed  Patient to follow up with pulmonology as scheduled later this month

## 2020-03-29 NOTE — Assessment & Plan Note (Signed)
Lipid panel ordered, patient unable to have blood drawn today due to decreased water intake.  Will return for lipid panel at later date.  Continue Zocor

## 2020-03-29 NOTE — Assessment & Plan Note (Signed)
Continue meloxicam

## 2020-03-29 NOTE — Assessment & Plan Note (Signed)
-

## 2020-03-29 NOTE — Assessment & Plan Note (Signed)
Continue Flonase, Claritin

## 2020-04-05 ENCOUNTER — Other Ambulatory Visit: Payer: Self-pay | Admitting: Neurology

## 2020-04-06 ENCOUNTER — Institutional Professional Consult (permissible substitution): Payer: Medicaid Other | Admitting: Internal Medicine

## 2020-04-08 ENCOUNTER — Other Ambulatory Visit: Payer: Self-pay | Admitting: Neurology

## 2020-04-10 ENCOUNTER — Other Ambulatory Visit: Payer: Self-pay

## 2020-04-10 ENCOUNTER — Ambulatory Visit: Payer: Medicaid Other | Admitting: Neurology

## 2020-04-10 ENCOUNTER — Encounter: Payer: Self-pay | Admitting: Neurology

## 2020-04-10 VITALS — BP 139/81 | HR 90 | Ht 61.0 in | Wt 189.8 lb

## 2020-04-10 DIAGNOSIS — G43711 Chronic migraine without aura, intractable, with status migrainosus: Secondary | ICD-10-CM | POA: Diagnosis not present

## 2020-04-10 DIAGNOSIS — R1312 Dysphagia, oropharyngeal phase: Secondary | ICD-10-CM

## 2020-04-10 DIAGNOSIS — G40909 Epilepsy, unspecified, not intractable, without status epilepticus: Secondary | ICD-10-CM | POA: Diagnosis not present

## 2020-04-10 MED ORDER — AIMOVIG 70 MG/ML ~~LOC~~ SOAJ: SUBCUTANEOUS | 11 refills | Status: DC

## 2020-04-10 MED ORDER — LAMOTRIGINE 200 MG PO TABS: 300.0000 mg | ORAL_TABLET | Freq: Two times a day (BID) | ORAL | 3 refills | Status: DC

## 2020-04-10 MED ORDER — QUETIAPINE FUMARATE 100 MG PO TABS: 100.0000 mg | ORAL_TABLET | Freq: Every day | ORAL | 1 refills | Status: DC

## 2020-04-10 NOTE — Progress Notes (Signed)
I have read the note, and I agree with the clinical assessment and plan.  Corneluis Allston K Ellora Varnum   

## 2020-04-10 NOTE — Progress Notes (Signed)
PATIENT: [name] DOB: 08-21-1955  REASON FOR VISIT: follow up HISTORY FROM: patient  HISTORY OF PRESENT ILLNESS: Today 04/10/20 [name] is a 64 year old female with history of intractable epilepsy and intractable headaches. She is on Lamictal and Topamax, when last seen Depakote was added. She complained of swallowing problems, barium swallow recommended caution with bread and meats, thin liquids, had mild pharyngeal dysphagia.  Could not tolerate Depakote, reported stomach upset, took for 1 week.  Reports 1 seizure of loss of consciousness while lying on the bed, triggered by coughing episode, she says not always coughing related, usually once a month.  Does have episodes of staring, lasting a few seconds, several times a month.  Headaches are fairly well controlled, on average 3-4 migraines a month, tolerating Aimovig 70 mg every 2 weeks well, has been quite beneficial.  Has noted that her voice is raspy, continues with trouble swallowing, does have COPD, still smokes.  Recently saw PCP, did not mention this.  She does not drive, lives with family.  HISTORY 10/12/2019 Dr. [name]: [name] is a 64 year old right-handed white female with a history of intractable epilepsy and intractable headache.  The patient has been on lamotrigine and Topamax without much benefit with her seizures.  She continues to have frequent headaches, her headaches have been daily in nature.  She has not responded to Ajovy.  In the past, she has been on Botox injections and seemed to improve some with the headaches.  More recently in the last month she is having increasing problems with swallowing with both liquids and solids.  She indicates that food will get caught in the back of the throat and she has to cough it back up again.  She is not getting food down in the lungs.  She has noted some hoarseness of the voice.  She is not losing weight, in fact she is gaining weight.  The patient does not operate a motor  vehicle.  She was set up for video visit today but cannot access this, a telephone visit therefore was done.  REVIEW OF SYSTEMS: Out of a complete 14 system review of symptoms, the patient complains only of the following symptoms, and all other reviewed systems are negative.  Seizure, headache  ALLERGIES: Allergies  Allergen Reactions  . Amoxicillin Anaphylaxis    Breakout, mouth swells  . Azithromycin Swelling and Rash    "swelling all over, high, thrush"  . Cefdinir     Throat swelling after 3rd dose  . Chantix [Varenicline] Other (See Comments)    "bad dreams, difficulty breathing"  . Doxycycline Hyclate Anaphylaxis and Rash    swelling  . Levofloxacin Anaphylaxis  . Penicillins Anaphylaxis, Shortness Of Breath and Rash    "ALMOST DIED, ended up in hospital"  PCN reaction causing immediate rash, facial/tongue/throat swelling, SOB or lightheadedness with hypotension: YES PCN reaction causing severe rash involving mucus membranes or skin necrosis: NO PCN reaction that required hospitalization YES Has patient had a PCN reaction occurring within the last 10 years: NO   . Sulfonamide Derivatives Shortness Of Breath, Swelling and Rash    "break out from head to toe"  . Toradol [Ketorolac Tromethamine] Other (See Comments)    Seizure   . Clarithromycin Hives    swelling  . Nortriptyline Swelling, Rash and Other (See Comments)    Per patient had "kidney problems" on this medication, could not use bathroom (urinary retention)  . Codeine Nausea And Vomiting  . Fluticasone-Salmeterol Other (See  Comments)    REACTION: ulcers in mouth  . Triptans Rash    Mouth breaks out and start itching    HOME MEDICATIONS: Outpatient Medications Prior to Visit  Medication Sig Dispense Refill  . acetaminophen (TYLENOL) 500 MG tablet Take 1,000 mg by mouth every other day.    . albuterol (PROVENTIL) (2.5 MG/3ML) 0.083% nebulizer solution USE 1 VIAL IN NEBULIZER EVERY 6 HOURS AS NEEDED FOR  SHORTNESS OF BREATH 75 mL 2  . albuterol (VENTOLIN HFA) 108 (90 Base) MCG/ACT inhaler INHALE 2 PUFFS INTO THE LUNGS EVERY 6 HOURS AS NEEDED FOR SHORTNESS OF BREATH 18 g 2  . calcium citrate (CALCITRATE) 950 (200 Ca) MG tablet Take 1 tablet (200 mg of elemental calcium total) by mouth 2 (two) times daily. 120 tablet 3  . Cholecalciferol (VITAMIN D3) 50 MCG (2000 UT) capsule Take 1 capsule (2,000 Units total) by mouth daily. 30 capsule 2  . ferrous sulfate 325 (65 FE) MG tablet Take 1 tablet (325 mg total) by mouth daily with breakfast. 90 tablet 2  . fluticasone (FLONASE) 50 MCG/ACT nasal spray SPRAY 2 SPRAYS INTO EACH NOSTRIL DAILY AS NEEDED FOR CONGESTION. 16 g 2  . guaiFENesin (MUCINEX) 600 MG 12 hr tablet Take 2 tablets (1,200 mg total) by mouth 2 (two) times daily as needed. 30 tablet 0  . loratadine (CLARITIN) 10 MG tablet Take 1 tablet (10 mg total) by mouth daily. 30 tablet 2  . meloxicam (MOBIC) 15 MG tablet Take 1 tablet (15 mg total) by mouth daily. 90 tablet 1  . mometasone-formoterol (DULERA) 200-5 MCG/ACT AERO INHALE 2 PUFFS INTO THE LUNGS TWO TIMES DAILY 13 g 2  . NON FORMULARY Use CPAP at bedtime    . omeprazole (PRILOSEC) 40 MG capsule Take 1 capsule (40 mg total) by mouth daily. 60 capsule 3  . polyethylene glycol powder (GLYCOLAX/MIRALAX) powder MIX 17 GMS IN LIQUID AND DRINK ONCE DAILY 527 g 0  . sertraline (ZOLOFT) 50 MG tablet Take 1 tablet (50 mg total) by mouth daily. 30 tablet 2  . simvastatin (ZOCOR) 40 MG tablet Take 1 tablet (40 mg total) by mouth at bedtime. 30 tablet 2  . topiramate (TOPAMAX) 100 MG tablet TAKE ONE TABLET BY MOUTH EVERY MORNING AND 2 TABLETS AT BEDTIME 270 tablet 3  . AIMOVIG 70 MG/ML SOAJ INJECT 70MG  INTO THE SKIN EVERY 14 DAYS 2 mL 2  . divalproex (DEPAKOTE) 250 MG DR tablet 1 tablet daily for 2 weeks, then take 1 tablet twice daily for 2 weeks, then take 1 in the morning and 2 in the evening for 2 weeks, then take 2 twice daily 120 tablet 3  .  lamoTRIgine (LAMICTAL) 200 MG tablet Take 1.5 tablets (300 mg total) by mouth 2 (two) times daily. 270 tablet 3  . QUEtiapine (SEROQUEL) 100 MG tablet TAKE ONE TABLET BY MOUTH AT BEDTIME 90 tablet 0   No facility-administered medications prior to visit.    PAST MEDICAL HISTORY: Past Medical History:  Diagnosis Date  . Allergic rhinitis   . Arthritis    "hands and legs and feet" (04/27/2012)  . Cancer ([place]) 05   rt arm mole rem cancer  . Chronic bronchitis ([place])    "keep it all the time" (04/27/2012)  . COPD (chronic obstructive pulmonary disease) ([place])   . Depression   . Diverticulosis   . DJD (degenerative joint disease)   . Emphysema   . Epilepsy ([place])   . Esophageal stricture   .  Exertional dyspnea   . GERD (gastroesophageal reflux disease)   . Glaucoma   . H/O blood clots    Right leg  . Hyperlipidemia   . Hypertension   . IBS (irritable bowel syndrome)   . Migraine   . Neck pain    Cervical disc  . OSA on CPAP    6-7 yrs; pt wears CPAP but has not been able to use it lately due to sinus issues  . Pneumonia 2012; 04/27/2012  . Right leg DVT ([place]) 1982   groin  . Seizures ([place])   . Type II diabetes mellitus ([place])    No meds since weight loss  . UTI (urinary tract infection)    2 weeks ago  . Vertigo 10/24/2014    PAST SURGICAL HISTORY: Past Surgical History:  Procedure Laterality Date  . ABDOMINAL HYSTERECTOMY  2001   "partial"  . CARPAL TUNNEL RELEASE  ~ 2008   right  . CATARACT EXTRACTION Bilateral   . COLONOSCOPY WITH ESOPHAGOGASTRODUODENOSCOPY (EGD)    . KNEE ARTHROSCOPY  1990's   left  . NASAL SEPTOPLASTY W/ TURBINOPLASTY Bilateral 05/23/2016   Procedure: NASAL SEPTOPLASTY WITH TURBINATE REDUCTION;  Surgeon: Jerrell Belfast, MD;  Location: [facility];  Service: ENT;  Laterality: Bilateral;  . SHOULDER OPEN ROTATOR CUFF REPAIR  ~ 2010   left  . SINUS ENDO WITH FUSION Right 05/23/2016   Procedure: LIMITED RIGHT ENDOSCOPIC SINUS SURGERY;  Surgeon: Jerrell Belfast, MD;  Location: [facility];  Service: ENT;  Laterality: Right;  . TOTAL KNEE ARTHROPLASTY  08/23/2012   right  . TOTAL KNEE ARTHROPLASTY  08/23/2012   Procedure: TOTAL KNEE ARTHROPLASTY;  Surgeon: Lorn Junes, MD;  Location: [facility];  Service: Orthopedics;  Laterality: Right;  RIGHT KNEE ARTHROPLASTY MEDIAL AND LATERAL COMPARTMENTS WITH PATELLA RESURFACING  . TUBAL LIGATION  2001    FAMILY HISTORY: Family History  Problem Relation Age of Onset  . Alcohol abuse Father   . Lung cancer Father        was a smoker  . Emphysema Father        was a smoker  . Heart disease Sister   . Lung cancer Mother        was a smoker  . Stroke Mother   . Other Sister        blood clotting disorder  . Seizures Brother   . Heart disease Maternal Grandfather   . Diabetes Maternal Grandfather   . Heart disease Other        uncle  . Diabetes Other        uncles/aunts  . Breast cancer Maternal Aunt   . Bipolar disorder Son   . Muscular dystrophy Son   . Heart disease Son   . Colon cancer Neg Hx     SOCIAL HISTORY: Social History   Socioeconomic History  . Marital status: Married    Spouse name: [name]  . Number of children: 2  . Years of education: 3  . Highest education level: Not on file  Occupational History  . Occupation: disabled  Tobacco Use  . Smoking status: Current Every Day Smoker    Packs/day: 1.00    Years: 40.00    Pack years: 40.00    Types: Cigarettes    Start date: 08/12/1975  . Smokeless tobacco: Former Systems developer    Quit date: 08/23/2012  . Tobacco comment: started back smoking in 11/2012  Substance and Sexual Activity  . Alcohol use: No  Alcohol/week: 0.0 standard drinks  . Drug use: No  . Sexual activity: Never  Other Topics Concern  . Not on file  Social History Narrative   Patient lives at home with husband [name], her son and his wife.    Patient has 2 children.    Patient has a high school education.    Patient is currently unemployed.    Patient is right  handed.    Patient drinks 2 cups of caffeine per day.   Social Determinants of Health   Financial Resource Strain:   . Difficulty of Paying Living Expenses: Not on file  Food Insecurity:   . Worried About Charity fundraiser in the Last Year: Not on file  . Ran Out of Food in the Last Year: Not on file  Transportation Needs:   . Lack of Transportation (Medical): Not on file  . Lack of Transportation (Non-Medical): Not on file  Physical Activity:   . Days of Exercise per Week: Not on file  . Minutes of Exercise per Session: Not on file  Stress:   . Feeling of Stress : Not on file  Social Connections:   . Frequency of Communication with Friends and Family: Not on file  . Frequency of Social Gatherings with Friends and Family: Not on file  . Attends Religious Services: Not on file  . Active Member of Clubs or Organizations: Not on file  . Attends Archivist Meetings: Not on file  . Marital Status: Not on file  Intimate Partner Violence:   . Fear of Current or Ex-Partner: Not on file  . Emotionally Abused: Not on file  . Physically Abused: Not on file  . Sexually Abused: Not on file   PHYSICAL EXAM  Vitals:   04/10/20 1315  BP: 139/81  Pulse: 90  Weight: 189 lb 12.8 oz (86.1 kg)  Height: 5\' 1"  (1.549 m)   Body mass index is 35.86 kg/m.  Generalized: Well developed, in no acute distress   Neurological examination  Mentation: Alert oriented to time, place, history taking. Follows all commands speech and language fluent, raspy voice is noted Cranial nerve II-XII: Pupils were equal round reactive to light. Extraocular movements were full, visual field were full on confrontational test. Facial sensation and strength were normal.  Head turning and shoulder shrug  were normal and symmetric. Motor: The motor testing reveals 5 over 5 strength of all 4 extremities. Good symmetric motor tone is noted throughout.  Sensory: Sensory testing is intact to soft touch on all 4  extremities. No evidence of extinction is noted.  Coordination: Cerebellar testing reveals good finger-nose-finger and heel-to-shin bilaterally.  Gait and station: Gait is wide-based, cautious, uses rolling walker in hallway Reflexes: Deep tendon reflexes are symmetric and normal bilaterally.   DIAGNOSTIC DATA (LABS, IMAGING, TESTING) - I reviewed patient records, labs, notes, testing and imaging myself where available.  Lab Results  Component Value Date   WBC 9.7 04/05/2019   HGB 11.8 04/05/2019   HCT 34.2 04/05/2019   MCV 91 04/05/2019   PLT 250 04/05/2019      Component Value Date/Time   NA 139 04/05/2019 1457   K 4.3 04/05/2019 1457   CL 105 04/05/2019 1457   CO2 21 04/05/2019 1457   GLUCOSE 96 04/05/2019 1457   GLUCOSE 82 06/13/2016 1441   BUN 15 04/05/2019 1457   CREATININE 1.02 (H) 04/05/2019 1457   CREATININE 0.91 06/13/2016 1441   CALCIUM 9.2 04/05/2019 1457   PROT  6.8 04/05/2019 1457   ALBUMIN 4.1 04/05/2019 1457   AST 9 04/05/2019 1457   ALT 6 04/05/2019 1457   ALKPHOS 135 (H) 04/05/2019 1457   BILITOT <0.2 04/05/2019 1457   GFRNONAA 60 04/05/2019 1457   GFRNONAA 70 06/13/2016 1441   GFRAA 69 04/05/2019 1457   GFRAA 80 06/13/2016 1441   Lab Results  Component Value Date   CHOL 148 06/19/2014   HDL 52 06/19/2014   LDLCALC 61 06/19/2014   LDLDIRECT 77 09/14/2012   TRIG 176 (H) 06/19/2014   CHOLHDL 2.8 06/19/2014   Lab Results  Component Value Date   HGBA1C 5.6 11/30/2019   Lab Results  Component Value Date   VITAMINB12 414 01/21/2008   Lab Results  Component Value Date   TSH 2.314 06/19/2014   ASSESSMENT AND PLAN 64 y.o. year old female  has a past medical history of Allergic rhinitis, Arthritis, Cancer ([place]) (05), Chronic bronchitis ([place]), COPD (chronic obstructive pulmonary disease) ([place]), Depression, Diverticulosis, DJD (degenerative joint disease), Emphysema, Epilepsy ([place]), Esophageal stricture, Exertional dyspnea, GERD (gastroesophageal  reflux disease), Glaucoma, H/O blood clots, Hyperlipidemia, Hypertension, IBS (irritable bowel syndrome), Migraine, Neck pain, OSA on CPAP, Pneumonia (2012; 04/27/2012), Right leg DVT ([place]) (1982), Seizures ([place]), Type II diabetes mellitus ([place]), UTI (urinary tract infection), and Vertigo (10/24/2014). here with:  1.  Intractable epilepsy -Continues to report episodes of loss of consciousness with jerking about once a month, staring episodes several times a month -Continue Lamictal 300 mg twice a day, also on Topamax 200 twice a day -Could not tolerate Depakote -Check drug level, routine labs today -EEG in 2016 was normal -Had a video monitor EEG in October 2016, did not have any seizure events during monitoring -If levels are therapeutic, may consider repeated video EEG or even EMU admission, possibly consider a 3rd AED agent?  2.  Intractable headaches -Continue Aimovig 70 mg every 2 weeks -On average 3-4 significant migraines monthly -Continue Topamax 200 mg twice a day, dual benefit epilepsy and headaches  3.  Dysphagia -Barium swallow study revealed mild pharyngeal dysphagia resulting in laryngeal vestibule penetration with thin liquids likely due to COPD -Continues with dysphagia, now has raspy voice -Will refer to ENT  4.  Insomnia -Continue Seroquel 100 mg at bedtime  I spent 30 minutes of face-to-face and non-face-to-face time with patient.  This included previsit chart review, lab review, study review, order entry, electronic health record documentation, patient education.  Butler Denmark, AGNP-C, DNP 04/10/2020, 2:07 PM [facility] [address] [place], [place] [address] [phone]

## 2020-04-10 NOTE — Patient Instructions (Signed)
Continue current medications  Check blood work today  Will consider referral to ENT See you back in 6 months

## 2020-04-11 ENCOUNTER — Telehealth: Payer: Self-pay | Admitting: Neurology

## 2020-04-11 DIAGNOSIS — G40909 Epilepsy, unspecified, not intractable, without status epilepticus: Secondary | ICD-10-CM

## 2020-04-11 LAB — CBC WITH DIFFERENTIAL/PLATELET
Basophils Absolute: 0.1 10*3/uL (ref 0.0–0.2)
Basos: 1 %
EOS (ABSOLUTE): 0.1 10*3/uL (ref 0.0–0.4)
Eos: 1 %
Hematocrit: 35.7 % (ref 34.0–46.6)
Hemoglobin: 11.6 g/dL (ref 11.1–15.9)
Immature Grans (Abs): 0 10*3/uL (ref 0.0–0.1)
Immature Granulocytes: 0 %
Lymphocytes Absolute: 2.2 10*3/uL (ref 0.7–3.1)
Lymphs: 22 %
MCH: 28.1 pg (ref 26.6–33.0)
MCHC: 32.5 g/dL (ref 31.5–35.7)
MCV: 86 fL (ref 79–97)
Monocytes Absolute: 0.8 10*3/uL (ref 0.1–0.9)
Monocytes: 8 %
Neutrophils Absolute: 6.5 10*3/uL (ref 1.4–7.0)
Neutrophils: 68 %
Platelets: 238 10*3/uL (ref 150–450)
RBC: 4.13 x10E6/uL (ref 3.77–5.28)
RDW: 17.5 % — ABNORMAL HIGH (ref 11.7–15.4)
WBC: 9.6 10*3/uL (ref 3.4–10.8)

## 2020-04-11 LAB — COMPREHENSIVE METABOLIC PANEL
ALT: 6 IU/L (ref 0–32)
AST: 12 IU/L (ref 0–40)
Albumin/Globulin Ratio: 1.2 (ref 1.2–2.2)
Albumin: 3.9 g/dL (ref 3.8–4.8)
Alkaline Phosphatase: 156 IU/L — ABNORMAL HIGH (ref 48–121)
BUN/Creatinine Ratio: 13 (ref 12–28)
BUN: 13 mg/dL (ref 8–27)
Bilirubin Total: <0.2 mg/dL (ref 0.0–1.2)
CO2: 21 mmol/L (ref 20–29)
Calcium: 8.6 mg/dL — ABNORMAL LOW (ref 8.7–10.3)
Chloride: 105 mmol/L (ref 96–106)
Creatinine, Ser: 1.02 mg/dL — ABNORMAL HIGH (ref 0.57–1.00)
GFR calc Af Amer: 68 mL/min/{1.73_m2} (ref >59)
GFR calc non Af Amer: 59 mL/min/{1.73_m2} — ABNORMAL LOW (ref >59)
Globulin, Total: 3.2 g/dL (ref 1.5–4.5)
Glucose: 91 mg/dL (ref 65–99)
Potassium: 4.3 mmol/L (ref 3.5–5.2)
Sodium: 139 mmol/L (ref 134–144)
Total Protein: 7.1 g/dL (ref 6.0–8.5)

## 2020-04-11 LAB — LAMOTRIGINE LEVEL: Lamotrigine Lvl: 3.1 ug/mL (ref 2.0–20.0)

## 2020-04-11 NOTE — Telephone Encounter (Signed)
Patient reports continued seizures, despite taking Lamictal 300 mg twice a day, Topamax 100/200 mg daily ( was reported as 200 mg BID, please verify this, could increase up if on 100/200).   Laboratory evaluation reveals Lamictal level 3.1, make sure is compliant, seems level would be slightly higher at prescribed dose.  Other labs show mildly elevated alkaline phosphatase, will follow over time.  Given, continued seizures despite medication, recently trialed Depakote, could not tolerate.  If the patient is willing, I will refer her to Dr. Hortense Ramal for EMU admission for evaluation of seizures. I will then order.   EEG in 2016 was normal, did not have any seizure events during video monitor EEG in October 2016.

## 2020-04-11 NOTE — Telephone Encounter (Signed)
I spoke to the patient. She confirmed her medications to be as follows :  1) lamotrigine 200mg , 1.5 BID 2) topiramate 100mg , 1 tab in am and 2 tabs in pm  Denies missing any doses.  She is agreeable to be referred to EMU/Dr. Hortense Ramal.   She is aware we will check with Judson Roch to see if her topiramate dosage should be increased.

## 2020-04-12 NOTE — Telephone Encounter (Signed)
Left message requesting a return call. I need to let her know to continue her lamotrigine and topiramate at the same dosages for now. Also, she should expect a call to get her scheduled for the epilepsy monitoring unit.

## 2020-04-12 NOTE — Telephone Encounter (Signed)
I will go ahead and refer her to Dr. Hortense Ramal for EMU admission for evaluation of seizures. Let's keep the medications as is for now.

## 2020-04-12 NOTE — Telephone Encounter (Signed)
The patient returned my call. She verbalized understanding of the plan outlined below.

## 2020-04-12 NOTE — Telephone Encounter (Signed)
Sent to [place] via email

## 2020-04-12 NOTE — Addendum Note (Signed)
Addended by: [name] on: 04/12/2020 12:25 PM   Modules accepted: Orders

## 2020-04-24 ENCOUNTER — Other Ambulatory Visit: Payer: Self-pay

## 2020-04-25 ENCOUNTER — Other Ambulatory Visit: Payer: Self-pay

## 2020-04-25 MED ORDER — OMEPRAZOLE 40 MG PO CPDR
40.0000 mg | DELAYED_RELEASE_CAPSULE | Freq: Every day | ORAL | 3 refills | Status: DC
Start: 2020-04-25 — End: 2020-04-26

## 2020-04-25 NOTE — Telephone Encounter (Signed)
Patient calls nurse line regarding Prilosec prescription. Patient states that she has been taking 40 mg twice daily in the past, however, new rx was written for once daily.   Patient is requesting refill on medication as she has ran out due to taking medication twice daily.   To PCP  Talbot Grumbling, RN

## 2020-04-26 MED ORDER — ALBUTEROL SULFATE HFA 108 (90 BASE) MCG/ACT IN AERS
INHALATION_SPRAY | RESPIRATORY_TRACT | 2 refills | Status: DC
Start: 2020-04-26 — End: 2020-07-09

## 2020-04-26 MED ORDER — OMEPRAZOLE 40 MG PO CPDR: 40.0000 mg | DELAYED_RELEASE_CAPSULE | Freq: Two times a day (BID) | ORAL | 3 refills | Status: DC

## 2020-04-26 NOTE — Telephone Encounter (Signed)
Received phone call from pharmacist asking if rx can be updated to reflect that patient is taking Prilosec twice daily.   Spoke with provider, gave verbal okay.   Called and spoke with Leanne at pharmacy and gave verbal order.   Talbot Grumbling, RN

## 2020-04-26 NOTE — Addendum Note (Signed)
Addended by: Talbot Grumbling on: 04/26/2020 01:42 PM   Modules accepted: Orders

## 2020-05-02 ENCOUNTER — Ambulatory Visit: Payer: Medicaid Other

## 2020-05-15 ENCOUNTER — Ambulatory Visit: Payer: Medicaid Other

## 2020-05-16 ENCOUNTER — Encounter: Payer: Self-pay | Admitting: Family Medicine

## 2020-05-16 ENCOUNTER — Other Ambulatory Visit: Payer: Self-pay

## 2020-05-16 ENCOUNTER — Ambulatory Visit (INDEPENDENT_AMBULATORY_CARE_PROVIDER_SITE_OTHER): Payer: Medicaid Other | Admitting: Family Medicine

## 2020-05-16 VITALS — BP 102/70 | HR 89 | Ht 61.0 in | Wt 197.0 lb

## 2020-05-16 DIAGNOSIS — Z0000 Encounter for general adult medical examination without abnormal findings: Secondary | ICD-10-CM | POA: Insufficient documentation

## 2020-05-16 DIAGNOSIS — E118 Type 2 diabetes mellitus with unspecified complications: Secondary | ICD-10-CM | POA: Diagnosis not present

## 2020-05-16 DIAGNOSIS — M545 Low back pain, unspecified: Secondary | ICD-10-CM | POA: Diagnosis not present

## 2020-05-16 DIAGNOSIS — G8929 Other chronic pain: Secondary | ICD-10-CM | POA: Diagnosis not present

## 2020-05-16 DIAGNOSIS — Z7689 Persons encountering health services in other specified circumstances: Secondary | ICD-10-CM | POA: Diagnosis present

## 2020-05-16 DIAGNOSIS — Z23 Encounter for immunization: Secondary | ICD-10-CM | POA: Diagnosis not present

## 2020-05-16 LAB — POCT GLYCOSYLATED HEMOGLOBIN (HGB A1C): HbA1c, POC (controlled diabetic range): 5.5 % (ref 0.0–7.0)

## 2020-05-16 MED ORDER — SERTRALINE HCL 50 MG PO TABS
50.0000 mg | ORAL_TABLET | Freq: Every day | ORAL | 2 refills | Status: DC
Start: 2020-05-16 — End: 2020-12-21

## 2020-05-16 MED ORDER — SIMVASTATIN 40 MG PO TABS
40.0000 mg | ORAL_TABLET | Freq: Every day | ORAL | 2 refills | Status: DC
Start: 2020-05-16 — End: 2021-03-18

## 2020-05-16 MED ORDER — DULERA 200-5 MCG/ACT IN AERO
INHALATION_SPRAY | RESPIRATORY_TRACT | 2 refills | Status: DC
Start: 2020-05-16 — End: 2020-06-04

## 2020-05-16 NOTE — Assessment & Plan Note (Signed)
Influenza vaccine given today  Patient declines COVID vaccine at recommendation of prior providers

## 2020-05-16 NOTE — Assessment & Plan Note (Addendum)
Patient presents for evaluation for power wheelchair for face-to-face assessment.  Reviewed PT assessment & concur with PT evaluation  Reviewed history with patient, reviewed ADL limited by mobility/concerns for balance while in the home using walker

## 2020-05-16 NOTE — Patient Instructions (Signed)
Today we discussed the requirements for your power wheelchair and worked on paperwork to be submitted for this.   I will get the required signatures and fax the forms ASAP.

## 2020-05-16 NOTE — Progress Notes (Addendum)
° ° °  SUBJECTIVE:   CHIEF COMPLAINT / HPI: mobility assessment for power wheelchair   Patient demonstrates the mental capacity to operate electric chair. She reports that during her neurology rehab visit, staff reviewed how to operate an electrical chair with her and she verbalizes understanding today.    Patient reports that she has been unable to leave home due to issues with balance and concern that she will fall. She states that she has to lean against walls when she is home alone to help with her balance. She endorses reoccurring episodes of dizziness.  Reports that sometimes her walker rolls away and does not prove to be a sturdy base to lean against.  She states that she is willing and would like to have the electric wheelchair to help her move around safely in her home. Power chair will assist with cleaning her home, dressing/grooming, toileting and mobility throughout the home.   HM  Patient recommendations for COVID vaccine with other specialist providers and was recommended to not have the vaccine at this time given her extensive allergies. Patient would like to have influenza vaccine today. Patient requests refills for Zoloft, Dulera and Zocor.   PERTINENT  PMH / PSH:  Diastolic Heart Failure  Pulmonary HTN  Chronic Respiratory Failure, SOB  COPD  BPPV  Osteopenia  Weakness  OBJECTIVE:   BP 102/70    Pulse 89    Ht 5\' 1"  (1.549 m)    Wt 197 lb (89.4 kg)    SpO2 92%    BMI 37.22 kg/m   Physical Exam Vitals reviewed.  Constitutional:      General: She is not in acute distress.    Comments: Female appearing older than stated age seated with walker in reach, patient demonstrates limited ability to stand relies heavily on walker to push off for standing from seated position   Eyes:     General: No scleral icterus. Neck:     Comments: Pain with rotation  Cardiovascular:     Rate and Rhythm: Normal rate and regular rhythm.     Pulses: Normal pulses.  Pulmonary:     Effort:  Pulmonary effort is normal. No respiratory distress.     Breath sounds: Wheezing and rhonchi present.  Chest:     Chest wall: No tenderness.  Abdominal:     General: Bowel sounds are normal.     Tenderness: There is no abdominal tenderness. There is no guarding.  Musculoskeletal:     Cervical back: Normal range of motion.     Comments: 4/5 strength in bilateral upper and lower extremities  Sensation intact grossly  Normal ROM   Neurological:     Mental Status: She is alert.     Comments: Patient responds appropriately to questions, demonstrates intact distant and recent memory      ASSESSMENT/PLAN:   Encounter for wheelchair assessment Patient presents for evaluation for power wheelchair for face-to-face assessment.  Reviewed PT assessment & concur with PT evaluation  Reviewed history with patient, reviewed ADL limited by mobility/concerns for balance while in the home using walker     Healthcare maintenance Influenza vaccine given today  Patient declines COVID vaccine at recommendation of prior providers      Eulis Foster, MD  05/16/2020 [place]

## 2020-05-24 ENCOUNTER — Institutional Professional Consult (permissible substitution): Payer: Medicaid Other | Admitting: Pulmonary Disease

## 2020-05-25 ENCOUNTER — Telehealth: Payer: Self-pay | Admitting: *Deleted

## 2020-05-25 NOTE — Telephone Encounter (Signed)
-----   Message from Eulis Foster, MD sent at 05/24/2020  5:24 PM EDT ----- Regarding: [facility] Scheduling Helloooo [facility]aron[facility] Siharath!   Mrs. Lemmons has agreed to [facility] at 4:00 PM.   [name]

## 2020-05-25 NOTE — Telephone Encounter (Signed)
Pt scheduled. Chanz Cahall, CMA  

## 2020-05-30 ENCOUNTER — Ambulatory Visit (INDEPENDENT_AMBULATORY_CARE_PROVIDER_SITE_OTHER): Payer: Medicaid Other | Admitting: Family Medicine

## 2020-05-30 ENCOUNTER — Encounter: Payer: Self-pay | Admitting: Family Medicine

## 2020-05-30 ENCOUNTER — Other Ambulatory Visit: Payer: Self-pay

## 2020-05-30 ENCOUNTER — Ambulatory Visit: Payer: Medicaid Other | Admitting: Family Medicine

## 2020-05-30 DIAGNOSIS — R42 Dizziness and giddiness: Secondary | ICD-10-CM | POA: Diagnosis present

## 2020-05-30 DIAGNOSIS — F17200 Nicotine dependence, unspecified, uncomplicated: Secondary | ICD-10-CM | POA: Diagnosis not present

## 2020-05-30 NOTE — Progress Notes (Signed)
    SUBJECTIVE:   CHIEF COMPLAINT / HPI: dizzy spell   Patient reports having an episode during the night where she was having a hard time orienting her vision to the room.  She felt that the room was spinning and it lasted for several moments.  Patient reports that she sleeps in a separate room from her spells, and consider calling her husband to come help her but was able to lie down and eventually the episode passed.  She states that she had some nausea with the episode but did not vomit.  She denies any pain in her chest or difficulty breathing during this episode.  She denies any recollection of activity that is similar to her seizures.  She reports that she did not fall during this episode.  Tobacco use Patient continues to report smoking 1 pack/day.  She states that one of her goals for her health in the upcoming months is to "breathe better".  Patient discussed that she would like to resume treatment to help with smoking cessation.  She reports that she has tried lozenges, patches and medication before without being able to quit smoking.  She reports that she is open to trying again in order to help her breathing.  PERTINENT  PMH / PSH: Epilepsy COPD  OBJECTIVE:   BP 100/60   Pulse 84   Ht 5\' 1"  (1.549 m)   Wt 200 lb 12.8 oz (91.1 kg)   SpO2 93%   BMI 37.94 kg/m   General: Female appearing stated age in no acute distress Cardio: Normal S1 and S2, no S3 or S4. Rhythm is regular. No murmurs or rubs.  Bilateral radial pulses palpable Pulm: Patient with normal work of breathing, bilateral coarse rhonchi heard throughout lung fields, patient on room air Extremities: Minimal peripheral edema. Neuro: pt alert and oriented x4, follows commands, PERRLA, EOMI bilaterally, 4/5 strength in upper extremities, 3/5 in lower extremities, no facial droop and facial nerves appear to be intact as well as normal gross exam of CN  ASSESSMENT/PLAN:   Dizziness Unclear as to what caused this  episode. Patient has hx of migraine HA and is currently undergoing treatment with Aimovig and decreased HA days per month. This may have been an atypical presentation of chronic HA. Initial concern for stroke v TIA but episode was unwitnessed so it is difficult to assess whether patient showed signs of neurological deficit given her baseline weakness as well as hx of vertigo. Patient's neuro exam was at baseline in office today, did not warrant emergent imaging of head. Patient given precautions to return and if episode occurs again, to call for help and observation of symptoms.   Tobacco use disorder Patient smokes 1PPD. Interested in assistance with cessation.  - scheduled with Dr. Valentina Lucks to discuss options      Eulis Foster, MD [place]

## 2020-05-30 NOTE — Patient Instructions (Signed)
It was a pleasure to see you today!  Thank you for choosing [facility] for your primary care.  [place] was seen for smoking cessation.   Our plans for today were:  I will contact Dr. Valentina Lucks to help set up an appointment for you to discuss more options to help you quit smoking and help your breathing to improve.  I will also submit a referral to sleep medicine to further evaluate you for a CPAP machine as well as discuss your insomnia.   To keep you healthy, please keep in mind the following health maintenance items that you are due for:   1. Eye Exam    You should return to our clinic in 6 weeks for follow up on smoking cessation.   Best Wishes,   Dr. Alba Cory    Steps to Quit Smoking Smoking tobacco is the leading cause of preventable death. It can affect almost every organ in the body. Smoking puts you and those around you at risk for developing many serious chronic diseases. Quitting smoking can be difficult, but it is one of the best things that you can do for your health. It is never too late to quit. How do I get ready to quit? When you decide to quit smoking, create a plan to help you succeed. Before you quit:  Pick a date to quit. Set a date within the next 2 weeks to give you time to prepare.  Write down the reasons why you are quitting. Keep this list in places where you will see it often.  Tell your family, friends, and co-workers that you are quitting. Support from your loved ones can make quitting easier.  Talk with your health care provider about your options for quitting smoking.  Find out what treatment options are covered by your health insurance.  Identify people, places, things, and activities that make you want to smoke (triggers). Avoid them. What first steps can I take to quit smoking?  Throw away all cigarettes at home, at work, and in your car.  Throw away smoking accessories, such as Scientist, research (medical).  Clean your car.  Make sure to empty the ashtray.  Clean your home, including curtains and carpets. What strategies can I use to quit smoking? Talk with your health care provider about combining strategies, such as taking medicines while you are also receiving in-person counseling. Using these two strategies together makes you more likely to succeed in quitting than if you used either strategy on its own.  If you are pregnant or breastfeeding, talk with your health care provider about finding counseling or other support strategies to quit smoking. Do not take medicine to help you quit smoking unless your health care provider tells you to do so. To quit smoking: Quit right away  Quit smoking completely, instead of gradually reducing how much you smoke over a period of time. Research shows that stopping smoking right away is more successful than gradually quitting.  Attend in-person counseling to help you build problem-solving skills. You are more likely to succeed in quitting if you attend counseling sessions regularly. Even short sessions of 10 minutes can be effective. Take medicine You may take medicines to help you quit smoking. Some medicines require a prescription and some you can purchase over-the-counter. Medicines may have nicotine in them to replace the nicotine in cigarettes. Medicines may:  Help to stop cravings.  Help to relieve withdrawal symptoms. Your health care provider may recommend:  Nicotine patches, gum,  or lozenges.  Nicotine inhalers or sprays.  Non-nicotine medicine that is taken by mouth. Find resources Find resources and support systems that can help you to quit smoking and remain smoke-free after you quit. These resources are most helpful when you use them often. They include:  Online chats with a Social worker.  Telephone quitlines.  Printed Furniture conservator/restorer.  Support groups or group counseling.  Text messaging programs.  Mobile phone apps or applications. Use apps  that can help you stick to your quit plan by providing reminders, tips, and encouragement. There are many free apps for mobile devices as well as websites. Examples include Quit Guide from the [employer] and smokefree.gov What things can I do to make it easier to quit?   Reach out to your family and friends for support and encouragement. Call telephone quitlines (1-800-QUIT-NOW), reach out to support groups, or work with a counselor for support.  Ask people who smoke to avoid smoking around you.  Avoid places that trigger you to smoke, such as bars, parties, or smoke-break areas at work.  Spend time with people who do not smoke.  Lessen the stress in your life. Stress can be a smoking trigger for some people. To lessen stress, try: ? Exercising regularly. ? Doing deep-breathing exercises. ? Doing yoga. ? Meditating. ? Performing a body scan. This involves closing your eyes, scanning your body from head to toe, and noticing which parts of your body are particularly tense. Try to relax the muscles in those areas. How will I feel when I quit smoking? Day 1 to 3 weeks Within the first 24 hours of quitting smoking, you may start to feel withdrawal symptoms. These symptoms are usually most noticeable 2-3 days after quitting, but they usually do not last for more than 2-3 weeks. You may experience these symptoms:  Mood swings.  Restlessness, anxiety, or irritability.  Trouble concentrating.  Dizziness.  Strong cravings for sugary foods and nicotine.  Mild weight gain.  Constipation.  Nausea.  Coughing or a sore throat.  Changes in how the medicines that you take for unrelated issues work in your body.  Depression.  Trouble sleeping (insomnia). Week 3 and afterward After the first 2-3 weeks of quitting, you may start to notice more positive results, such as:  Improved sense of smell and taste.  Decreased coughing and sore throat.  Slower heart rate.  Lower blood  pressure.  Clearer skin.  The ability to breathe more easily.  Fewer sick days. Quitting smoking can be very challenging. Do not get discouraged if you are not successful the first time. Some people need to make many attempts to quit before they achieve long-term success. Do your best to stick to your quit plan, and talk with your health care provider if you have any questions or concerns. Summary  Smoking tobacco is the leading cause of preventable death. Quitting smoking is one of the best things that you can do for your health.  When you decide to quit smoking, create a plan to help you succeed.  Quit smoking right away, not slowly over a period of time.  When you start quitting, seek help from your health care provider, family, or friends. This information is not intended to replace advice given to you by your health care provider. Make sure you discuss any questions you have with your health care provider. Document Revised: 04/22/2019 Document Reviewed: 10/16/2018 Elsevier Patient Education  [place].

## 2020-06-04 ENCOUNTER — Other Ambulatory Visit: Payer: Self-pay | Admitting: *Deleted

## 2020-06-04 MED ORDER — DULERA 200-5 MCG/ACT IN AERO: INHALATION_SPRAY | RESPIRATORY_TRACT | 2 refills | Status: DC

## 2020-06-04 MED ORDER — VITAMIN D3 50 MCG (2000 UT) PO CAPS
2000.0000 [IU] | ORAL_CAPSULE | Freq: Every day | ORAL | 2 refills | Status: DC
Start: 2020-06-04 — End: 2020-10-01

## 2020-06-04 NOTE — Assessment & Plan Note (Signed)
Patient smokes 1PPD. Interested in assistance with cessation.  - scheduled with Dr. Valentina Lucks to discuss options

## 2020-06-04 NOTE — Assessment & Plan Note (Signed)
Unclear as to what caused this episode. Patient has hx of migraine HA and is currently undergoing treatment with Aimovig and decreased HA days per month. This may have been an atypical presentation of chronic HA. Initial concern for stroke v TIA but episode was unwitnessed so it is difficult to assess whether patient showed signs of neurological deficit given her baseline weakness as well as hx of vertigo. Patient's neuro exam was at baseline in office today, did not warrant emergent imaging of head. Patient given precautions to return and if episode occurs again, to call for help and observation of symptoms.

## 2020-06-07 ENCOUNTER — Other Ambulatory Visit: Payer: Self-pay

## 2020-06-07 MED ORDER — LORATADINE 10 MG PO TABS
10.0000 mg | ORAL_TABLET | Freq: Every day | ORAL | 2 refills | Status: DC
Start: 2020-06-07 — End: 2020-11-21

## 2020-06-14 ENCOUNTER — Institutional Professional Consult (permissible substitution): Payer: Medicaid Other | Admitting: Pulmonary Disease

## 2020-06-14 DIAGNOSIS — I509 Heart failure, unspecified: Secondary | ICD-10-CM | POA: Diagnosis not present

## 2020-06-14 DIAGNOSIS — J449 Chronic obstructive pulmonary disease, unspecified: Secondary | ICD-10-CM | POA: Diagnosis not present

## 2020-06-14 DIAGNOSIS — G4733 Obstructive sleep apnea (adult) (pediatric): Secondary | ICD-10-CM | POA: Diagnosis not present

## 2020-06-25 ENCOUNTER — Other Ambulatory Visit: Payer: Self-pay | Admitting: *Deleted

## 2020-06-25 MED ORDER — ALBUTEROL SULFATE (2.5 MG/3ML) 0.083% IN NEBU
INHALATION_SOLUTION | RESPIRATORY_TRACT | 2 refills | Status: DC
Start: 2020-06-25 — End: 2020-12-18

## 2020-06-28 ENCOUNTER — Ambulatory Visit: Payer: Medicaid Other | Admitting: Pharmacist

## 2020-07-09 ENCOUNTER — Other Ambulatory Visit: Payer: Self-pay | Admitting: Family Medicine

## 2020-07-11 ENCOUNTER — Institutional Professional Consult (permissible substitution): Payer: Medicaid Other | Admitting: Pulmonary Disease

## 2020-08-01 ENCOUNTER — Institutional Professional Consult (permissible substitution): Payer: Medicaid Other | Admitting: Pulmonary Disease

## 2020-08-02 DIAGNOSIS — G4733 Obstructive sleep apnea (adult) (pediatric): Secondary | ICD-10-CM | POA: Diagnosis not present

## 2020-08-02 DIAGNOSIS — J449 Chronic obstructive pulmonary disease, unspecified: Secondary | ICD-10-CM | POA: Diagnosis not present

## 2020-08-02 DIAGNOSIS — I509 Heart failure, unspecified: Secondary | ICD-10-CM | POA: Diagnosis not present

## 2020-08-17 ENCOUNTER — Other Ambulatory Visit: Payer: Self-pay | Admitting: *Deleted

## 2020-08-17 ENCOUNTER — Other Ambulatory Visit: Payer: Self-pay

## 2020-08-17 DIAGNOSIS — Z139 Encounter for screening, unspecified: Secondary | ICD-10-CM

## 2020-08-17 NOTE — Patient Instructions (Signed)
Visit Information  [name] was given information about Medicaid Managed Care team care coordination services as a part of their [place] Medicaid benefit. [name] verbally consented to engagement with the [facility] Managed Care team.   For questions related to your [facility], please call: [phone] or visit the homepage here: [email or web address]  If you would like to schedule transportation through your [facility], please call the following number at least 2 days in advance of your appointment: [phone]   Patient Care Plan: COPD (Adult)    Problem Identified: Disease Progression (COPD)     Long-Range Goal: Disease Progression Minimized or Managed   Start Date: 08/17/2020  Expected End Date: 10/17/2020  This Visit's Progress: On track  Priority: Medium  Note:   Current Barriers:  . Chronic Disease Management support and education needs related to COPD-[name] reports difficulty swallowing solids and liquids, missed her appointment for evaluation due to lack of transportation. She is not sleeping consistently at night-lost her CPAP during a recent move. She does not want to go into the hospital for a sleep study due to fear of Covid. She has not been vaccinated due to her many allergies. She is working on smoking cessation-no cigarettes since yesterday. . Film/video editor.  . Transportation barriers . Food Insecurities-Has food stamps, but tends to run out of food before the end of the month . Non-adherence to scheduled provider appointments-due to transportation barriers  Nurse Case Manager Clinical Goal(s):  Marland Kitchen Over the next 30 days, patient will verbalize understanding of plan for swallowing evaluation. Encouraged patient to call and reschedule appointment today. . Over the next 30 days, patient will meet with RN Care Manager to address plan for sleep  study to acquire new CPAP . Over the next 30 days, patient will attend all scheduled medical appointments: 08/29/20 with Pulmonology . Over the next 30 days, patient will work with CM team pharmacist to review medications . Over the next 30 days, patient will work with [place] to address community resources/food insecurities  Interventions:  . Inter-disciplinary care team collaboration (see longitudinal plan of care) . Advised patient to reschedule appointment for swallowing evaluation, discuss need for new CPAP at her appointment with Pulmonology, continue to work on cutting back on the number of cigarettes/day . Reviewed medications with patient and discussed her needing refill for iron and her difficulty swallowing pills . Reviewed scheduled/upcoming provider appointments including: 08/29/20 with Pulmonology and rescheduling swallow evaluation . Care Guide referral for food insecurities . Pharmacy referral for medication review/assistance . Provided patient with the [facility] community transportation number [phone]  Patient Goals/Self-Care Activities Over the next 30 days, patient will:  -Self administers medications as prescribed - Attends all scheduled provider appointments - Calls pharmacy for medication refills - Calls provider office for new concerns or questions - develop a new routine to improve sleep - eat healthy - get at least 7 to 8 hours of sleep at night - keep room cool and dark - practice relaxation or meditation daily - begin a notebook of services in my neighborhood or community - call 211 when I need some help - follow-up on any referrals for help I am given - make a list of family or friends that I can call   Follow Up Plan: Telephone follow up appointment with Managed Medicaid care management team member scheduled for:09/19/20 @ 11:15am         Please see education  materials related to COPD  provided by MyChart link.    Eating Plan for Chronic  Obstructive Pulmonary Disease Chronic obstructive pulmonary disease (COPD) causes symptoms such as shortness of breath, coughing, and chest discomfort. These symptoms can make it difficult to eat enough to maintain a healthy weight. Generally, people with COPD should eat a diet that is high in calories, protein, and other nutrients to maintain body weight and to keep the lungs as healthy as possible. Depending on the medicines you take and other health conditions you may have, your health care provider may give you additional recommendations on what to eat or avoid. Talk with your health care provider about your goals for body weight, and work with a dietitian to develop an eating plan that is right for you. What are tips for following this plan? Reading food labels   Avoid foods with more than 300 milligrams (mg) of salt (sodium) per serving.  Choose foods that contain at least 4 grams (g) of fiber per serving. Try to eat 20-30 g of fiber each day.  Choose foods that are high in calories and protein, such as nuts, beans, yogurt, and cheese. Shopping  Do not buy foods labeled as diet, low-calorie, or low-fat.  If you are able to eat dairy products: ? Avoid low-fat or skim milk. ? Buy dairy products that have at least 2% fat.  Buy nutritional supplement drinks.  Buy grains and prepared foods labeled as enriched or fortified.  Consider buying low-sodium, pre-made foods to conserve energy for eating. Cooking  Add dry milk or protein powder to smoothies.  Cook with healthy fats, such as olive oil, canola oil, sunflower oil, and grapeseed oil.  Add oil, butter, cream cheese, or nut butters to foods to increase fat and calories.  To make foods easier to chew and swallow: ? Cook vegetables, pasta, and rice until soft. ? Cut or grind meat into very small pieces. ? Dip breads in liquid. Meal planning   Eat when you feel hungry.  Eat 5-6 small meals throughout the day.  Drink 6-8  glasses of water each day.  Do not drink liquids with meals. Drink liquids at the end of the meal to avoid feeling full too quickly.  Eat a variety of fruits and vegetables every day.  Ask for assistance from family or friends with planning and preparing meals as needed.  Avoid foods that cause you to feel bloated, such as carbonated drinks, fried foods, beans, broccoli, cabbage, and apples.  For older adults, ask your local agency on aging whether you are eligible for meal assistance programs, such as Meals on Wheels. Lifestyle   Do not smoke.  Eat slowly. Take small bites and chew food well before swallowing.  Do not overeat. This may make it more difficult to breathe after eating.  Sit up while eating.  If needed, continue to use supplemental oxygen while eating.  Rest or relax for 30 minutes before and after eating.  Monitor your weight as told by your health care provider.  Exercise as told by your health care provider. What foods can I eat? Fruits All fresh, dried, canned, or frozen fruits that do not cause gas. Vegetables All fresh, canned (no salt added), or frozen vegetables that do not cause gas. Grains Whole grain bread. Enriched whole grain pasta. Fortified whole grain cereals. Fortified rice. Quinoa. Meats and other proteins Lean meat. Poultry. Fish. Dried beans. Unsalted nuts. Tofu. Eggs. Nut butters. Dairy Whole or 2% milk. Cheese.  Yogurt. Fats and oils Olive oil. Canola oil. Butter. Margarine. Beverages Water. Vegetable juice (no salt added). Decaffeinated coffee. Decaffeinated or herbal tea. Seasonings and condiments Fresh or dried herbs. Low-salt or salt-free seasonings. Low-sodium soy sauce. The items listed above may not be a complete list of foods and beverages you can eat. Contact a dietitian for more information. What foods are not recommended? Fruits Fruits that cause gas, such as apples or melon. Vegetables Vegetables that cause gas, such as  broccoli, Brussels sprouts, cabbage, cauliflower, and onions. Canned vegetables with added salt. Meats and other proteins Fried meat. Salt-cured meat. Processed meat. Dairy Fat-free or low-fat milk, yogurt, or cheese. Processed cheese. Beverages Carbonated drinks. Caffeinated drinks, such as coffee, tea, and soft drinks. Juice. Alcohol. Vegetable juice with added salt. Seasonings and condiments Salt. Seasoning mixes with salt. Soy sauce. Rosita Fire. Other foods Clear soup or broth. Fried foods. Prepared frozen meals. The items listed above may not be a complete list of foods and beverages you should avoid. Contact a dietitian for more information. Summary  COPD symptoms can make it difficult to eat enough to maintain a healthy weight.  A COPD eating plan can help you maintain your body weight and keep your lungs as healthy as possible.  Eat a diet that is high in calories, protein, and other nutrients. Read labels to make sure that you are getting the right nutrients. Cook foods to make them easy to chew and swallow.  Eat 5-6 small meals throughout the day, and avoid foods that cause gas or make you feel bloated. This information is not intended to replace advice given to you by your health care provider. Make sure you discuss any questions you have with your health care provider. Document Revised: 11/18/2018 Document Reviewed: 10/13/2017 Elsevier Patient Education  2020 [employer].   Patient verbalizes understanding of instructions provided today.   Telephone follow up appointment with Managed Medicaid care management team member scheduled for:09/19/20 @ 11:15am  Heidi Dach, RN

## 2020-08-17 NOTE — Patient Outreach (Signed)
Medicaid Managed Care   Nurse Care Manager Note  08/17/2020 Name:  [name] MRN:  [id number] DOB:  02/10/1956  [name] is an 65 y.o. year old female who is a primary patient of [name], [name], MD.  The [facility] team was consulted for assistance with:    COPD  [name] was given information about [employer] team services today. [name] agreed to services and verbal consent obtained.  Engaged with patient by telephone for initial visit in response to provider referral for case management and/or care coordination services.   Assessments/Interventions:  Review of past medical history, allergies, medications, health status, including review of consultants reports, laboratory and other test data, was performed as part of comprehensive evaluation and provision of chronic care management services.  SDOH (Social Determinants of Health) assessments and interventions performed:   Care Plan  Allergies  Allergen Reactions  . Amoxicillin Anaphylaxis    Breakout, mouth swells  . Azithromycin Swelling and Rash    "swelling all over, high, thrush"  . Cefdinir     Throat swelling after 3rd dose  . Chantix [Varenicline] Other (See Comments)    "bad dreams, difficulty breathing"  . Doxycycline Hyclate Anaphylaxis and Rash    swelling  . Levofloxacin Anaphylaxis  . Penicillins Anaphylaxis, Shortness Of Breath and Rash    "ALMOST DIED, ended up in hospital"  PCN reaction causing immediate rash, facial/tongue/throat swelling, SOB or lightheadedness with hypotension: YES PCN reaction causing severe rash involving mucus membranes or skin necrosis: NO PCN reaction that required hospitalization YES Has patient had a PCN reaction occurring within the last 10 years: NO   . Sulfonamide Derivatives Shortness Of Breath, Swelling and Rash    "break out from head to toe"  . Toradol [Ketorolac Tromethamine] Other (See Comments)    Seizure    . Clarithromycin Hives    swelling  . Nortriptyline Swelling, Rash and Other (See Comments)    Per patient had "kidney problems" on this medication, could not use bathroom (urinary retention)  . Codeine Nausea And Vomiting  . Fluticasone-Salmeterol Other (See Comments)    REACTION: ulcers in mouth  . Triptans Rash    Mouth breaks out and start itching    Medications Reviewed Today    Reviewed by [name], RN (Registered Nurse) on 08/17/20 at 1101  Med List Status: <None>  Medication Order Taking? Sig Documenting Provider Last Dose Status Informant  acetaminophen (TYLENOL) 500 MG tablet [id number] Yes Take 1,000 mg by mouth every other day. [provider] Taking Active Self  albuterol (PROAIR HFA) 108 (90 Base) MCG/ACT inhaler [id number] Yes INHALE TWO PUFFS INTO THE LUNGS EVERY 6 HOURS AS NEEDED FOR SHORTNESS OF BREATH [name], [name], MD Taking Active   albuterol (PROVENTIL) (2.5 MG/3ML) 0.083% nebulizer solution [id number] Yes USE 1 VIAL IN NEBULIZER EVERY 6 HOURS AS NEEDED FOR SHORTNESS OF BREATH [name], [name], MD Taking Active   aspirin EC 81 MG tablet [id number] Yes Take 81 mg by mouth daily. Swallow whole. [provider] Taking Active   calcium citrate (CALCITRATE) 950 (200 Ca) MG tablet [id number] Yes Take 1 tablet (200 mg of elemental calcium total) by mouth 2 (two) times daily. [name], [name], MD Taking Active   Cholecalciferol (VITAMIN D3) 50 MCG (2000 UT) capsule [id number] Yes Take 1 capsule (2,000 Units total) by mouth daily. [name], [name], MD Taking Active   Erenumab-aooe (AIMOVIG) 70 MG/ML Darden Palmer [id number] Yes INJECT 70MG  INTO THE  SKIN EVERY 14 DAYS [name], NP Taking Active   ferrous sulfate 325 (65 FE) MG tablet [id number] No Take 1 tablet (325 mg total) by mouth daily with breakfast.  Patient not taking: Reported on 08/17/2020   [name], [name], MD Not Taking Active   fluticasone (FLONASE) 50  MCG/ACT nasal spray [id number] Yes SPRAY 2 SPRAYS INTO EACH NOSTRIL DAILY AS NEEDED FOR CONGESTION. [name], [name], MD Taking Active   guaiFENesin (MUCINEX) 600 MG 12 hr tablet [id number] No Take 2 tablets (1,200 mg total) by mouth 2 (two) times daily as needed.  Patient not taking: Reported on 08/17/2020   Eulis Foster, MD Not Taking Active   lamoTRIgine (LAMICTAL) 200 MG tablet [id number] Yes Take 1.5 tablets (300 mg total) by mouth 2 (two) times daily. [name], NP Taking Active   loratadine (CLARITIN) 10 MG tablet [id number] Yes Take 1 tablet (10 mg total) by mouth daily. [name], [name], MD Taking Active   meloxicam (MOBIC) 15 MG tablet [id number] Yes Take 1 tablet (15 mg total) by mouth daily. [name], [name], MD Taking Active   mometasone-formoterol [facility]) 200-5 MCG/ACT AERO [id number] Yes INHALE 2 PUFFS INTO THE LUNGS TWO TIMES DAILY [name], [name], MD Taking Active   NON FORMULARY [id number] No Use CPAP at bedtime  Patient not taking: Reported on 08/17/2020   [provider] Not Taking Active Self  omeprazole (PRILOSEC) 40 MG capsule [id number] Yes Take 1 capsule (40 mg total) by mouth 2 (two) times daily. [name], [name], MD Taking Active   polyethylene glycol powder (GLYCOLAX/MIRALAX) powder [id number] No MIX 17 GMS IN LIQUID AND DRINK ONCE DAILY  Patient not taking: Reported on 08/17/2020   [name], MD Not Taking Active   QUEtiapine (SEROQUEL) 100 MG tablet [id number] Yes Take 1 tablet (100 mg total) by mouth at bedtime. [name], NP Taking Active   sertraline (ZOLOFT) 50 MG tablet [id number] Yes Take 1 tablet (50 mg total) by mouth daily. [name], [name], MD Taking Active   simvastatin (ZOCOR) 40 MG tablet [id number] Yes Take 1 tablet (40 mg total) by mouth at bedtime. [name], [name], MD Taking Active   topiramate (TOPAMAX) 100 MG tablet [id number] Yes TAKE ONE TABLET BY MOUTH  EVERY MORNING AND 2 TABLETS AT BEDTIME [name], NP Taking Active           Patient Active Problem List   Diagnosis Date Noted  . Encounter for wheelchair assessment 05/16/2020  . Healthcare maintenance 05/16/2020  . Shortness of breath 07/26/2019  . Tobacco use disorder 07/02/2018  . Elevated serum creatinine 06/01/2018  . Weakness 05/15/2017  . Chronic bronchitis ([place]) 01/14/2017  . Claudication ([place]) 04/09/2016  . Seizure disorder ([place]) 11/12/2015  . 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 Chronic obstructive pulmonary disease ([place]) 04/25/2015  . Dizziness 10/24/2014  . Pulmonary hypertension ([place]) 08/06/2013  . Diastolic CHF ([place]) 94/85/4627  . BPPV (benign paroxysmal positional vertigo) 03/09/2013  . Chronic respiratory failure ([place]) 11/01/2012  . Left shoulder pain 06/27/2012  . ANXIETY 02/13/2010  . Obstructive sleep apnea 02/19/2009  . Vitamin D deficiency 01/21/2008  . OSTEOPENIA 11/24/2007  . Irritable bowel syndrome 11/17/2007  . Allergic rhinitis 09/24/2007  . Hyperlipidemia 10/08/2006  . OBESITY, NOS  BMI 33.8 10/08/2006  . GLAUCOMA 10/08/2006  . GASTROESOPHAGEAL REFLUX, NO ESOPHAGITIS 10/08/2006  . HERNIA, HIATAL, NONCONGENITAL 10/08/2006  . INCONTINENCE, STRESS, FEMALE 10/08/2006  . DJD (degenerative joint disease) of knee 10/08/2006    Conditions  to be addressed/monitored per PCP order:  COPD  Patient Care Plan: COPD (Adult)    Problem Identified: Disease Progression (COPD)     Long-Range Goal: Disease Progression Minimized or Managed   Start Date: 08/17/2020  Expected End Date: 10/17/2020  This Visit's Progress: On track  Priority: Medium  Note:   Current Barriers:  . Chronic Disease Management support and education needs related to COPD-Ms. Adami reports difficulty swallowing solids and liquids, missed her appointment for evaluation due to lack of transportation. She is not sleeping consistently at  night-lost her CPAP during a recent move. She does not want to go into the hospital for a sleep study due to fear of Covid. She has not been vaccinated due to her many allergies. She is working on smoking cessation-no cigarettes since yesterday. . Film/video editor.  . Transportation barriers . Food Insecurities-Has food stamps, but tends to run out of food before the end of the month . Non-adherence to scheduled provider appointments-due to transportation barriers  Nurse Case Manager Clinical Goal(s):  Marland Kitchen Over the next 30 days, patient will verbalize understanding of plan for swallowing evaluation. Encouraged patient to call and reschedule appointment today. . Over the next 30 days, patient will meet with RN Care Manager to address plan for sleep study to acquire new CPAP . Over the next 30 days, patient will attend all scheduled medical appointments: 08/29/20 with Pulmonology . Over the next 30 days, patient will work with CM team pharmacist to review medications . Over the next 30 days, patient will work with [place] to address community resources/food insecurities  Interventions:  . Inter-disciplinary care team collaboration (see longitudinal plan of care) . Advised patient to reschedule appointment for swallowing evaluation, discuss need for new CPAP at her appointment with Pulmonology, continue to work on cutting back on the number of cigarettes/day . Reviewed medications with patient and discussed her needing refill for iron and her difficulty swallowing pills . Reviewed scheduled/upcoming provider appointments including: 08/29/20 with Pulmonology and rescheduling swallow evaluation . Care Guide referral for food insecurities . Pharmacy referral for medication review/assistance . Provided patient with the [facility] community transportation number [phone]  Patient Goals/Self-Care Activities Over the next 30 days, patient will:  -Self administers medications as prescribed - Attends  all scheduled provider appointments - Calls pharmacy for medication refills - Calls provider office for new concerns or questions - develop a new routine to improve sleep - eat healthy - get at least 7 to 8 hours of sleep at night - keep room cool and dark - practice relaxation or meditation daily - begin a notebook of services in my neighborhood or community - call 211 when I need some help - follow-up on any referrals for help I am given - make a list of family or friends that I can call   Follow Up Plan: Telephone follow up appointment with Managed Medicaid care management team member scheduled for:09/19/20 @ 11:15am         Follow Up:  Patient agrees to Care Plan and Follow-up.  Plan: The Managed Medicaid care management team will reach out to the patient again over the next 30 days.  Date/time of next scheduled RN care management/care coordination outreach:  09/19/20 @ 11:15  Lurena Joiner RN, [place] RN Care Coordinator

## 2020-08-21 ENCOUNTER — Other Ambulatory Visit: Payer: Self-pay

## 2020-08-21 ENCOUNTER — Other Ambulatory Visit: Payer: Self-pay | Admitting: Family Medicine

## 2020-08-21 ENCOUNTER — Telehealth: Payer: Self-pay

## 2020-08-21 MED ORDER — FERROUS SULFATE 325 (65 FE) MG PO TABS
325.0000 mg | ORAL_TABLET | Freq: Every day | ORAL | 2 refills | Status: DC
Start: 2020-08-21 — End: 2020-11-12

## 2020-08-21 NOTE — Progress Notes (Signed)
Refill for iron supplementation sent per request.

## 2020-08-21 NOTE — Patient Instructions (Signed)
Visit Information  [name] was given information about Medicaid Managed Care team care coordination services as a part of their [place] Medicaid benefit. [facility] verbally consented to engagement with the [facility] Managed Care team.   For questions related to your [facility], please call: [phone] or visit the homepage here: [email or web address]  If you would like to schedule transportation through your [facility], please call the following number at least 2 days in advance of your appointment: [phone]  [name] - following are the goals we discussed in your visit today:  @[email or web address]   Please see education materials related to Smoking Cessation provided as print materials.   Patient verbalizes understanding of instructions provided today.   The Managed Medicaid care management team will reach out to the patient again over the next 90 days.   [name], [facility]  Following is a copy of your plan of care:  Patient Care Plan: COPD (Adult)    Problem Identified: Disease Progression (COPD)     Long-Range Goal: Disease Progression Minimized or Managed   Start Date: 08/17/2020  Expected End Date: 10/17/2020  This Visit's Progress: On track  Priority: Medium  Note:   Current Barriers:  . Chronic Disease Management support and education needs related to COPD-[name] reports difficulty swallowing solids and liquids, missed her appointment for evaluation due to lack of transportation. She is not sleeping consistently at night-lost her CPAP during a recent move. She does not want to go into the hospital for a sleep study due to fear of Covid. She has not been vaccinated due to her many allergies. She is working on smoking cessation-no cigarettes since yesterday. . Film/video editor.  . Transportation barriers . Food Insecurities-Has food stamps, but  tends to run out of food before the end of the month . Non-adherence to scheduled provider appointments-due to transportation barriers  Nurse Case Manager Clinical Goal(s):  Marland Kitchen Over the next 30 days, patient will verbalize understanding of plan for swallowing evaluation. Encouraged patient to call and reschedule appointment today. . Over the next 30 days, patient will meet with RN Care Manager to address plan for sleep study to acquire new CPAP . Over the next 30 days, patient will attend all scheduled medical appointments: 08/29/20 with Pulmonology . Over the next 30 days, patient will work with CM team pharmacist to review medications . Over the next 30 days, patient will work with [place] to address community resources/food insecurities  Interventions:  . Inter-disciplinary care team collaboration (see longitudinal plan of care) . Advised patient to reschedule appointment for swallowing evaluation, discuss need for new CPAP at her appointment with Pulmonology, continue to work on cutting back on the number of cigarettes/day . Reviewed medications with patient and discussed her needing refill for iron and her difficulty swallowing pills . Reviewed scheduled/upcoming provider appointments including: 08/29/20 with Pulmonology and rescheduling swallow evaluation . Care Guide referral for food insecurities . Pharmacy referral for medication review/assistance . Provided patient with the [facility] community transportation number [phone]  Patient Goals/Self-Care Activities Over the next 30 days, patient will:  -Self administers medications as prescribed - Attends all scheduled provider appointments - Calls pharmacy for medication refills - Calls provider office for new concerns or questions - develop a new routine to improve sleep - eat healthy - get at least 7 to 8 hours of sleep at night - keep room cool and dark - practice relaxation or meditation  daily - begin a notebook of services in  my neighborhood or community - call 211 when I need some help - follow-up on any referrals for help I am given - make a list of family or friends that I can call   Follow Up Plan: Telephone follow up appointment with Managed Medicaid care management team member scheduled for:09/19/20 @ 11:15am       Patient Care Plan: MM Disease Management    Problem Identified: Health Promotion or Disease Self-Management (General Plan of Care)     Long-Range Goal: MM Disease Management   Start Date: 08/21/2020  Expected End Date: 10/18/2020  This Visit's Progress: On track  Priority: Medium  Note:   Current Barriers:  . Patient has difficulty swallowing . Patient struggles with food o Has stamps but they run out by end of month . Smoking cessation  Pharmacist Clinical Goal(s):  Marland Kitchen Over the next 90 days, patient will  Contact Smoking Cessation hotline through [place] from phone number  through collaboration with PharmD and provider.  .   Interventions: . Inter-disciplinary care team collaboration (see longitudinal plan of care) . Comprehensive medication review performed; medication list updated in electronic medical record  @RXCPCOPD @ @RXCPMENTALHEALTH @  Patient Goals/Self-Care Activities . Over the next 90 days, patient will:  -  Work on Smoking Cessation  Follow Up Plan: The care management team will reach out to the patient again over the next 90 days.      Task: Mutually Develop and [name] Achievement of Patient Goals   Note:   Care Management Activities:        Notes:

## 2020-08-21 NOTE — Telephone Encounter (Signed)
    MA1/06/2021 1st Attempt  Name: [name]   MRN: [id number]   DOB: 1956/01/12   AGE: 65 y.o.   GENDER: female   PCP [name], [name], MD.   Referral Reason: Food Insecurity  Interventions: Successful outbound call placed to the patient Patient provided with information about care guide support team and interviewed to confirm resource needs Investigation of community resources performed Discussed resources to assist with food insecurity, emailed resources to [email or web address]  Follow up plan: Care guide will follow up with patient by phone over the next 7 days     Dashiell Franchino, AAS Paralegal, [place] . Embedded Care Coordination [facility]  Care Management  [address], [place] [address] [email or web address]  [phone]   [email or web address]

## 2020-08-21 NOTE — Patient Outreach (Signed)
Medicaid Managed Care    Pharmacy Note  08/21/2020 Name: [name] MRN: [id number] DOB: [birth year]  [name] is a 65 y.o. year old female who is a primary care patient of [name], [name], MD. The [facility] Managed Care Coordination team was consulted for assistance with disease management and care coordination needs.    Engaged with patient Engaged with patient by telephone for initial visit in response to referral for case management and/or care coordination services.  [name] was given information about Managed Medicaid Care Coordination team services today. [place] agreed to services and verbal consent obtained.   Objective:  Lab Results  Component Value Date   CREATININE 1.02 (H) 04/10/2020   CREATININE 1.02 (H) 04/05/2019   CREATININE 1.09 (H) 06/01/2018    Lab Results  Component Value Date   HGBA1C 5.5 05/16/2020       Component Value Date/Time   CHOL 148 06/19/2014 1656   TRIG 176 (H) 06/19/2014 1656   HDL 52 06/19/2014 1656   CHOLHDL 2.8 06/19/2014 1656   VLDL 35 06/19/2014 1656   LDLCALC 61 06/19/2014 1656   LDLDIRECT 77 09/14/2012 1618    Other: (TSH, CBC, Vit D, etc.)  Clinical ASCVD: No  The ASCVD Risk score Mikey Bussing DC Jr., et al., 2013) failed to calculate for the following reasons:   Cannot find a previous HDL lab   Cannot find a previous total cholesterol lab    Other: (CHADS2VASc if Afib, PHQ9 if depression, MMRC or CAT for COPD, ACT, DEXA)  BP Readings from Last 3 Encounters:  05/30/20 100/60  05/16/20 102/70  04/10/20 139/81    Assessment/Interventions: Review of patient past medical history, allergies, medications, health status, including review of consultants reports, laboratory and other test data, was performed as part of comprehensive evaluation and provision of chronic care management services.   Covid Vaccination Has allergies and hasn't gotten Covid vaccine due to fears Plan: Spoke a lot about , patient will try to get  shot at Hospital in case a reaction occurs  Smoking 1PPD -Likes to smoke because wants time to herself Patch gum didn't work -Can't take pill due to seizures Plan: Will call [place] Hotline and work on cessation  Migraines Aimovig Topiramate 100mg  1AM and 2HS Plan: At goal,  patient stable/ symptoms controlled   Seizures Lamotrigine 200mg  take 1.5tabs BID Plan: At goal,  patient stable/ symptoms controlled    Mood: Depression screen [facility] 2/9 05/30/2020 05/16/2020 03/27/2020  Decreased Interest 2 1 3   Down, Depressed, Hopeless 0 2 0  PHQ - 2 Score 2 3 3   Altered sleeping 3 2 0  Tired, decreased energy 1 1 0  Change in appetite 2 2 0  Feeling bad or failure about yourself  0 0 1  Trouble concentrating 0 0 0  Moving slowly or fidgety/restless 1 2 0  Suicidal thoughts 0 0 0  PHQ-9 Score 9 10 4   Difficult doing work/chores Extremely dIfficult - -  Some recent data might be hidden   Sertraline 50mg  Plan: At goal,  patient stable/ symptoms controlled    Insomnia Quetiapine 100mg  HS -Helps her go to sleep -Wakes up 2-3x/week 10PM to 3AM is her sleep schedule -Has sleep apnea  Plan: Once she gets the covid vaccine she can get another sleep study done, this should help stress and all disease states  Cholesterol Lab Results  Component Value Date   CHOL 148 06/19/2014   CHOL 202 (H) 02/13/2010   CHOL  203 (H) 01/26/2008   Lab Results  Component Value Date   HDL 52 06/19/2014   HDL 55 02/13/2010   HDL 47 01/26/2008   Lab Results  Component Value Date   LDLCALC 61 06/19/2014   LDLCALC 126 (H) 02/13/2010   LDLCALC 113 (H) 01/26/2008   Lab Results  Component Value Date   TRIG 176 (H) 06/19/2014   TRIG 104 02/13/2010   TRIG 215 (H) 01/26/2008   Lab Results  Component Value Date   CHOLHDL 2.8 06/19/2014   CHOLHDL 3.7 Ratio 02/13/2010   CHOLHDL 4.3 Ratio 01/26/2008   Lab Results  Component Value Date   LDLDIRECT 77 09/14/2012   LDLDIRECT 103 (H) 01/31/2011    LDLDIRECT 56 03/29/2008   Simvastatin 40mg  Plan: Focus was smoking cessation today, will inquire about changing statin at future appt  Pain 08/21/20: Without meds:10      With Meloxicam: 8 Meloxicam (Doesn't help much but does a little) Plan: At goal,  patient stable/ symptoms controlled      SDOH (Social Determinants of Health) assessments and interventions performed:    Care Plan  Allergies  Allergen Reactions  . Amoxicillin Anaphylaxis    Breakout, mouth swells  . Azithromycin Swelling and Rash    "swelling all over, high, thrush"  . Cefdinir     Throat swelling after 3rd dose  . Chantix [Varenicline] Other (See Comments)    "bad dreams, difficulty breathing"  . Doxycycline Hyclate Anaphylaxis and Rash    swelling  . Levofloxacin Anaphylaxis  . Penicillins Anaphylaxis, Shortness Of Breath and Rash    "ALMOST DIED, ended up in hospital"  PCN reaction causing immediate rash, facial/tongue/throat swelling, SOB or lightheadedness with hypotension: YES PCN reaction causing severe rash involving mucus membranes or skin necrosis: NO PCN reaction that required hospitalization YES Has patient had a PCN reaction occurring within the last 10 years: NO   . Sulfonamide Derivatives Shortness Of Breath, Swelling and Rash    "break out from head to toe"  . Toradol [Ketorolac Tromethamine] Other (See Comments)    Seizure   . Clarithromycin Hives    swelling  . Nortriptyline Swelling, Rash and Other (See Comments)    Per patient had "kidney problems" on this medication, could not use bathroom (urinary retention)  . Codeine Nausea And Vomiting  . Fluticasone-Salmeterol Other (See Comments)    REACTION: ulcers in mouth  . Triptans Rash    Mouth breaks out and start itching    Medications Reviewed Today    Reviewed by Lane Hacker, [facility] (Pharmacist) on 08/21/20 at 1140  Med List Status: <None>  Medication Order Taking? Sig Documenting Provider Last Dose Status Informant   acetaminophen (TYLENOL) 500 MG tablet [id number] Yes Take 1,000 mg by mouth every other day. [provider] Taking Active Self  albuterol (PROAIR HFA) 108 (90 Base) MCG/ACT inhaler [id number] Yes INHALE TWO PUFFS INTO THE LUNGS EVERY 6 HOURS AS NEEDED FOR SHORTNESS OF BREATH [name], [name], MD Taking Active   albuterol (PROVENTIL) (2.5 MG/3ML) 0.083% nebulizer solution [id number] Yes USE 1 VIAL IN NEBULIZER EVERY 6 HOURS AS NEEDED FOR SHORTNESS OF BREATH [name], [name], MD Taking Active   aspirin EC 81 MG tablet [id number] Yes Take 81 mg by mouth daily. Swallow whole. [provider] Taking Active   calcium citrate (CALCITRATE) 950 (200 Ca) MG tablet [id number] Yes Take 1 tablet (200 mg of elemental calcium total) by mouth 2 (two) times daily. Eulis Foster, MD Taking Active  Cholecalciferol (VITAMIN D3) 50 MCG (2000 UT) capsule [id number] Yes Take 1 capsule (2,000 Units total) by mouth daily. [name], [name], MD Taking Active   Erenumab-aooe (AIMOVIG) 70 MG/ML Darden Palmer [id number] Yes INJECT 70MG  INTO THE SKIN EVERY 14 DAYS [name], NP Taking Active   ferrous sulfate 325 (65 FE) MG tablet [id number] Yes Take 1 tablet (325 mg total) by mouth daily with breakfast. [name], [name], MD Taking Active            Med Note (ROBB, MELANIE A   Fri Aug 17, 2020 11:02 AM) Needs refill  fluticasone (FLONASE) 50 MCG/ACT nasal spray [id number] Yes SPRAY 2 SPRAYS INTO EACH NOSTRIL DAILY AS NEEDED FOR CONGESTION. [name], [name], MD Taking Active   guaiFENesin (MUCINEX) 600 MG 12 hr tablet [id number] No Take 2 tablets (1,200 mg total) by mouth 2 (two) times daily as needed.  Patient not taking: No sig reported   [name], [name], MD Not Taking Active   lamoTRIgine (LAMICTAL) 200 MG tablet [id number] Yes Take 1.5 tablets (300 mg total) by mouth 2 (two) times daily. [name], NP Taking Active   loratadine (CLARITIN) 10 MG tablet  [id number] Yes Take 1 tablet (10 mg total) by mouth daily. [name], [name], MD Taking Active   meloxicam (MOBIC) 15 MG tablet [id number] Yes Take 1 tablet (15 mg total) by mouth daily. [name], [name], MD Taking Active   mometasone-formoterol [facility]) 200-5 MCG/ACT AERO [id number] Yes INHALE 2 PUFFS INTO THE LUNGS TWO TIMES DAILY [name], [name], MD Taking Active   NON FORMULARY [id number] No Use CPAP at bedtime  Patient not taking: No sig reported   [provider] Not Taking Active   omeprazole (PRILOSEC) 40 MG capsule [id number] Yes Take 1 capsule (40 mg total) by mouth 2 (two) times daily. [name], [name], MD Taking Active   polyethylene glycol powder (GLYCOLAX/MIRALAX) powder [id number] No MIX 17 GMS IN LIQUID AND DRINK ONCE DAILY  Patient not taking: No sig reported   [name], MD Not Taking Consider Medication Status and Discontinue   QUEtiapine (SEROQUEL) 100 MG tablet [id number] Yes Take 1 tablet (100 mg total) by mouth at bedtime. [name], NP Taking Active   sertraline (ZOLOFT) 50 MG tablet [id number] Yes Take 1 tablet (50 mg total) by mouth daily. [name], [name], MD Taking Active   simvastatin (ZOCOR) 40 MG tablet [id number] Yes Take 1 tablet (40 mg total) by mouth at bedtime. [name], [name], MD Taking Active   topiramate (TOPAMAX) 100 MG tablet [id number] Yes TAKE ONE TABLET BY MOUTH EVERY MORNING AND 2 TABLETS AT BEDTIME [name], NP Taking Active           Patient Active Problem List   Diagnosis Date Noted  . Encounter for wheelchair assessment 05/16/2020  . Healthcare maintenance 05/16/2020  . Shortness of breath 07/26/2019  . Tobacco use disorder 07/02/2018  . Elevated serum creatinine 06/01/2018  . Weakness 05/15/2017  . Chronic bronchitis ([place]) 01/14/2017  . Claudication ([place]) 04/09/2016  . Seizure disorder ([place]) 11/12/2015  . Intractable chronic migraine without aura with status  migrainosus 05/23/2015  . Chronic obstructive pulmonary disease ([place]) 04/25/2015  . Dizziness 10/24/2014  . Pulmonary hypertension ([place]) 08/06/2013  . Diastolic CHF ([place]) 92/42/6834  . BPPV (benign paroxysmal positional vertigo) 03/09/2013  . Chronic respiratory failure ([place]) 11/01/2012  . Left shoulder pain 06/27/2012  . ANXIETY 02/13/2010  . Obstructive sleep apnea 02/19/2009  . Vitamin D deficiency 01/21/2008  . OSTEOPENIA 11/24/2007  . Irritable  bowel syndrome 11/17/2007  . Allergic rhinitis 09/24/2007  . Hyperlipidemia 10/08/2006  . OBESITY, NOS  BMI 33.8 10/08/2006  . GLAUCOMA 10/08/2006  . GASTROESOPHAGEAL REFLUX, NO ESOPHAGITIS 10/08/2006  . HERNIA, HIATAL, NONCONGENITAL 10/08/2006  . INCONTINENCE, STRESS, FEMALE 10/08/2006  . DJD (degenerative joint disease) of knee 10/08/2006    Conditions to be addressed/monitored: COPD and Smoking Cessation  Patient Care Plan: COPD (Adult)    Problem Identified: Disease Progression (COPD)     Long-Range Goal: Disease Progression Minimized or Managed   Start Date: 08/17/2020  Expected End Date: 10/17/2020  This Visit's Progress: On track  Priority: Medium  Note:   Current Barriers:  . Chronic Disease Management support and education needs related to COPD-Ms. Fouty reports difficulty swallowing solids and liquids, missed her appointment for evaluation due to lack of transportation. She is not sleeping consistently at night-lost her CPAP during a recent move. She does not want to go into the hospital for a sleep study due to fear of Covid. She has not been vaccinated due to her many allergies. She is working on smoking cessation-no cigarettes since yesterday. . Film/video editor.  . Transportation barriers . Food Insecurities-Has food stamps, but tends to run out of food before the end of the month . Non-adherence to scheduled provider appointments-due to transportation barriers  Nurse Case Manager Clinical Goal(s):  Marland Kitchen Over the next  30 days, patient will verbalize understanding of plan for swallowing evaluation. Encouraged patient to call and reschedule appointment today. . Over the next 30 days, patient will meet with RN Care Manager to address plan for sleep study to acquire new CPAP . Over the next 30 days, patient will attend all scheduled medical appointments: 08/29/20 with Pulmonology . Over the next 30 days, patient will work with CM team pharmacist to review medications . Over the next 30 days, patient will work with [place] to address community resources/food insecurities  Interventions:  . Inter-disciplinary care team collaboration (see longitudinal plan of care) . Advised patient to reschedule appointment for swallowing evaluation, discuss need for new CPAP at her appointment with Pulmonology, continue to work on cutting back on the number of cigarettes/day . Reviewed medications with patient and discussed her needing refill for iron and her difficulty swallowing pills . Reviewed scheduled/upcoming provider appointments including: 08/29/20 with Pulmonology and rescheduling swallow evaluation . Care Guide referral for food insecurities . Pharmacy referral for medication review/assistance . Provided patient with the [facility] community transportation number [phone]  Patient Goals/Self-Care Activities Over the next 30 days, patient will:  -Self administers medications as prescribed - Attends all scheduled provider appointments - Calls pharmacy for medication refills - Calls provider office for new concerns or questions - develop a new routine to improve sleep - eat healthy - get at least 7 to 8 hours of sleep at night - keep room cool and dark - practice relaxation or meditation daily - begin a notebook of services in my neighborhood or community - call 211 when I need some help - follow-up on any referrals for help I am given - make a list of family or friends that I can call   Follow Up Plan:  Telephone follow up appointment with Managed Medicaid care management team member scheduled for:09/19/20 @ 11:15am       Patient Care Plan: MM Disease Management    Problem Identified: Health Promotion or Disease Self-Management (General Plan of Care)     Long-Range Goal: MM Disease Management   Start Date: 08/21/2020  Expected End Date: 10/18/2020  This Visit's Progress: On track  Priority: Medium  Note:   Current Barriers:  . Patient has difficulty swallowing . Patient struggles with food o Has stamps but they run out by end of month . Smoking cessation  Pharmacist Clinical Goal(s):  Marland Kitchen Over the next 90 days, patient will  Contact Smoking Cessation hotline through [place] from phone number  through collaboration with PharmD and provider.  .   Interventions: . Inter-disciplinary care team collaboration (see longitudinal plan of care) . Comprehensive medication review performed; medication list updated in electronic medical record  @RXCPCOPD @ @[email or web address] @  Patient Goals/Self-Care Activities . Over the next 90 days, patient will:  -  Work on Smoking Cessation  Follow Up Plan: The care management team will reach out to the patient again over the next 90 days.      Task: Mutually Develop and Royce Macadamia Achievement of Patient Goals   Note:   Care Management Activities:        Notes:      Medication Assistance: None required. Patient affirms current coverage meets needs.   Follow up: Agree  Plan: The care management team will reach out to the patient again over the next 90 days.   [place] Constable, Pharm.D., Managed Medicaid Pharmacist - [phone]

## 2020-08-24 ENCOUNTER — Telehealth: Payer: Self-pay

## 2020-08-24 NOTE — Telephone Encounter (Signed)
    Telephone encounter was:  Successful.  08/24/20 Name: EVELYNA FOLKER MRN: [id number] DOB: 11-02-1955  DENELDA AKERLEY is a 65 year old female who is a primary care patient of [name], [name], MD . The community resource team was consulted for assistance with [place] guide performed the following interventions: Follow up call placed to the patient to discuss status of referral Patient received emailed food resources and plans on visiting them in the next few days..  Follow Up Plan:  No further follow up planned at this time. The patient has been provided with needed resources.    Kharter Sestak, AAS Paralegal, [place] . Embedded Care Coordination [facility]  Care Management  [address], [place] [address] [email or web address]  [phone]   [email or web address]

## 2020-08-29 ENCOUNTER — Institutional Professional Consult (permissible substitution): Payer: Medicaid Other | Admitting: Pulmonary Disease

## 2020-09-05 ENCOUNTER — Other Ambulatory Visit: Payer: Self-pay | Admitting: Family Medicine

## 2020-09-05 DIAGNOSIS — M545 Low back pain, unspecified: Secondary | ICD-10-CM

## 2020-09-18 ENCOUNTER — Institutional Professional Consult (permissible substitution): Payer: Medicaid Other | Admitting: Pulmonary Disease

## 2020-09-19 ENCOUNTER — Other Ambulatory Visit: Payer: Self-pay | Admitting: *Deleted

## 2020-09-19 NOTE — Patient Instructions (Signed)
Visit Information  Ms. Herington as a part of your Medicaid benefit, you are eligible for care management and care coordination services at no cost or copay. I was unable to reach you by phone today but would be happy to help you with your health related needs. Please feel free to call me @ [phone].   A member of the Managed Medicaid care management team will reach out to you again over the next 7-14 days.   Lurena Joiner RN, BSN [place]  [employer] manager

## 2020-09-19 NOTE — Patient Outreach (Signed)
Care Coordination  09/19/2020  [name] Sep 23, 1955 [id number]    Medicaid Managed Care   Unsuccessful Outreach Note  09/19/2020 Name: [name] MRN: [id number] DOB: 1956/07/12  Referred by: Eulis Foster, MD Reason for referral : High Risk Managed Medicaid (Unsuccessful RNCM follow up outreach)   An unsuccessful telephone outreach was attempted today. The patient was referred to the case management team for assistance with care management and care coordination.   Follow Up Plan: The patient has been provided with contact information for the care management team and has been advised to call with any health related questions or concerns.  The care management team will reach out to the patient again over the next 7-14 days.   Lurena Joiner RN, BSN [place]  [employer] manager

## 2020-09-24 ENCOUNTER — Telehealth: Payer: Self-pay | Admitting: Family Medicine

## 2020-09-24 ENCOUNTER — Other Ambulatory Visit: Payer: Self-pay

## 2020-09-24 MED ORDER — ALBUTEROL SULFATE HFA 108 (90 BASE) MCG/ACT IN AERS
INHALATION_SPRAY | RESPIRATORY_TRACT | 2 refills | Status: DC
Start: 2020-09-24 — End: 2020-12-10

## 2020-09-24 NOTE — Telephone Encounter (Signed)
I attempted to reach [name] today to get her rescheduled for a phone visit with the [facility] on the Managed Medicaid team. She missed her phone visit on 09/19/20. I left my contact information on her voicemail. I will reach out again in the next 7-14 days if I have not heard back from her.

## 2020-09-28 ENCOUNTER — Other Ambulatory Visit: Payer: Self-pay

## 2020-09-28 ENCOUNTER — Other Ambulatory Visit: Payer: Self-pay | Admitting: *Deleted

## 2020-09-28 NOTE — Patient Outreach (Signed)
Medicaid Managed Care   Nurse Care Manager Note  09/28/2020 Name:  [name] MRN:  [id number] DOB:  07-05-1956  [name] is an 65 y.o. year old female who is a primary patient of [name], [name], MD.  The [place] team was consulted for assistance with:    COPD  Ms. Lanza was given information about [employer] team services today. [place] agreed to services and verbal consent obtained.  Engaged with patient by telephone for follow up visit in response to provider referral for case management and/or care coordination services.   Assessments/Interventions:  Review of past medical history, allergies, medications, health status, including review of consultants reports, laboratory and other test data, was performed as part of comprehensive evaluation and provision of chronic care management services.  SDOH (Social Determinants of Health) assessments and interventions performed:   Care Plan  Allergies  Allergen Reactions  . Amoxicillin Anaphylaxis    Breakout, mouth swells  . Azithromycin Swelling and Rash    "swelling all over, high, thrush"  . Cefdinir     Throat swelling after 3rd dose  . Chantix [Varenicline] Other (See Comments)    "bad dreams, difficulty breathing"  . Doxycycline Hyclate Anaphylaxis and Rash    swelling  . Levofloxacin Anaphylaxis  . Penicillins Anaphylaxis, Shortness Of Breath and Rash    "ALMOST DIED, ended up in hospital"  PCN reaction causing immediate rash, facial/tongue/throat swelling, SOB or lightheadedness with hypotension: YES PCN reaction causing severe rash involving mucus membranes or skin necrosis: NO PCN reaction that required hospitalization YES Has patient had a PCN reaction occurring within the last 10 years: NO   . Sulfonamide Derivatives Shortness Of Breath, Swelling and Rash    "break out from head to toe"  . Toradol [Ketorolac Tromethamine] Other (See Comments)     Seizure   . Clarithromycin Hives    swelling  . Nortriptyline Swelling, Rash and Other (See Comments)    Per patient had "kidney problems" on this medication, could not use bathroom (urinary retention)  . Codeine Nausea And Vomiting  . Fluticasone-Salmeterol Other (See Comments)    REACTION: ulcers in mouth  . Triptans Rash    Mouth breaks out and start itching    Medications Reviewed Today    Reviewed by [name], RN (Registered Nurse) on 09/28/20 at 73  Med List Status: <None>  Medication Order Taking? Sig Documenting Provider Last Dose Status Informant  acetaminophen (TYLENOL) 500 MG tablet [id number] Yes Take 1,000 mg by mouth every other day. [provider] Taking Active Self  albuterol (PROAIR HFA) 108 (90 Base) MCG/ACT inhaler [id number] Yes INHALE TWO PUFFS INTO THE LUNGS EVERY 6 HOURS AS NEEDED FOR SHORTNESS OF BREATH [name], [name], MD Taking Active   albuterol (PROVENTIL) (2.5 MG/3ML) 0.083% nebulizer solution [id number] Yes USE 1 VIAL IN NEBULIZER EVERY 6 HOURS AS NEEDED FOR SHORTNESS OF BREATH [name], [name], MD Taking Active   aspirin EC 81 MG tablet [id number] Yes Take 81 mg by mouth daily. Swallow whole. [provider] Taking Active   calcium citrate (CALCITRATE) 950 (200 Ca) MG tablet [id number] Yes Take 1 tablet (200 mg of elemental calcium total) by mouth 2 (two) times daily. [name], [name], MD Taking Active   Cholecalciferol (VITAMIN D3) 50 MCG (2000 UT) capsule [id number] Yes Take 1 capsule (2,000 Units total) by mouth daily. [name], [name], MD Taking Active   Erenumab-aooe (AIMOVIG) 70 MG/ML Darden Palmer [id number] Yes INJECT $RemoveBe'70MG'yHkmaBlDb$  INTO  THE SKIN EVERY 14 DAYS [name], NP Taking Active   ferrous sulfate 325 (65 FE) MG tablet [id number] Yes Take 1 tablet (325 mg total) by mouth daily with breakfast. [name], [name], MD Taking Active   fluticasone (FLONASE) 50 MCG/ACT nasal spray [id number] Yes SPRAY 2  SPRAYS INTO EACH NOSTRIL DAILY AS NEEDED FOR CONGESTION. [name], [name], MD Taking Active   guaiFENesin (MUCINEX) 600 MG 12 hr tablet [id number]  Take 2 tablets (1,200 mg total) by mouth 2 (two) times daily as needed.  Patient not taking: No sig reported   [name], [name], MD  Active   lamoTRIgine (LAMICTAL) 200 MG tablet [id number] Yes Take 1.5 tablets (300 mg total) by mouth 2 (two) times daily. [name], NP Taking Active   loratadine (CLARITIN) 10 MG tablet [id number] Yes Take 1 tablet (10 mg total) by mouth daily. [name], [name], MD Taking Active   meloxicam (MOBIC) 15 MG tablet [id number] Yes TAKE ONE TABLET BY MOUTH EVERY DAY [name], [name], MD Taking Active   mometasone-formoterol [facility]) 200-5 MCG/ACT AERO [id number] Yes INHALE 2 PUFFS INTO THE LUNGS TWO TIMES DAILY [name], [name], MD Taking Active   NON FORMULARY [id number] No Use CPAP at bedtime  Patient not taking: No sig reported   [provider] Not Taking Active   omeprazole (PRILOSEC) 40 MG capsule [id number] Yes TAKE ONE CAPSULE BY MOUTH TWICE DAILY [name], [name], MD Taking Active   polyethylene glycol powder (GLYCOLAX/MIRALAX) powder [id number] No MIX 17 GMS IN LIQUID AND DRINK ONCE DAILY  Patient not taking: No sig reported   [name], MD Not Taking Active   QUEtiapine (SEROQUEL) 100 MG tablet [id number] Yes Take 1 tablet (100 mg total) by mouth at bedtime. [name], NP Taking Active   sertraline (ZOLOFT) 50 MG tablet [id number] Yes Take 1 tablet (50 mg total) by mouth daily. [name], [name], MD Taking Active   simvastatin (ZOCOR) 40 MG tablet [id number] Yes Take 1 tablet (40 mg total) by mouth at bedtime. [name], [name], MD Taking Active   topiramate (TOPAMAX) 100 MG tablet [id number] Yes TAKE ONE TABLET BY MOUTH EVERY MORNING AND 2 TABLETS AT BEDTIME [name], NP Taking Active           Patient Active  Problem List   Diagnosis Date Noted  . Encounter for wheelchair assessment 05/16/2020  . Healthcare maintenance 05/16/2020  . Shortness of breath 07/26/2019  . Tobacco use disorder 07/02/2018  . Elevated serum creatinine 06/01/2018  . Weakness 05/15/2017  . Chronic bronchitis ([place]) 01/14/2017  . Claudication ([place]) 04/09/2016  . Seizure disorder ([place]) 11/12/2015  . 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 Chronic obstructive pulmonary disease ([place]) 04/25/2015  . Dizziness 10/24/2014  . Pulmonary hypertension ([place]) 08/06/2013  . Diastolic CHF ([place]) 96/78/9381  . BPPV (benign paroxysmal positional vertigo) 03/09/2013  . Chronic respiratory failure ([place]) 11/01/2012  . Left shoulder pain 06/27/2012  . ANXIETY 02/13/2010  . Obstructive sleep apnea 02/19/2009  . Vitamin D deficiency 01/21/2008  . OSTEOPENIA 11/24/2007  . Irritable bowel syndrome 11/17/2007  . Allergic rhinitis 09/24/2007  . Hyperlipidemia 10/08/2006  . OBESITY, NOS  BMI 33.8 10/08/2006  . GLAUCOMA 10/08/2006  . GASTROESOPHAGEAL REFLUX, NO ESOPHAGITIS 10/08/2006  . HERNIA, HIATAL, NONCONGENITAL 10/08/2006  . INCONTINENCE, STRESS, FEMALE 10/08/2006  . DJD (degenerative joint disease) of knee 10/08/2006    Conditions to be addressed/monitored per PCP order:  COPD  Care Plan : COPD (Adult)  Updates made  by [name], RN since 09/28/2020 12:00 AM    Problem: Disease Progression (COPD)     Long-Range Goal: Disease Progression Minimized or Managed   Start Date: 08/17/2020  Expected End Date: 10/17/2020  Recent Progress: On track  Priority: Medium  Note:   Current Barriers:  . Chronic Disease Management support and education needs related to COPD-Ms. Coolman reports difficulty swallowing solids and liquids, missed her appointment for evaluation due to lack of transportation. She is not sleeping consistently at night-lost her CPAP during a recent move. She does not want to go into  the hospital for a sleep study due to fear of Covid. She has not been vaccinated due to her many allergies. She is working on smoking cessation-down to 1/2 pack/day. Update-Ms. Nevers was unable to attend her appointment with pulmonology and would like to reschedule to the [place] office. She reports needing knee surgery, but needs to get her breathing under control first. She is concerned that provided transportation will not provide service to her due to the distance. . Film/video editor.  . Transportation barriers . Food Insecurities-Has food stamps, but tends to run out of food before the end of the month . Non-adherence to scheduled provider appointments-due to transportation barriers  Nurse Case Manager Clinical Goal(s):  Marland Kitchen Over the next 30 days, patient will verbalize understanding of plan for swallowing evaluation. Encouraged patient to call and reschedule appointment today.-Update-Patient has not had a chance to call to reschedule . Over the next 30 days, patient will meet with RN Care Manager to address plan for sleep study to acquire new CPAP . Over the next 30 days, patient will attend all scheduled medical appointments: 09/18/20 with Pulmonology-missed appointment, plans to reschedule today at the [place] office . Over the next 30 days, patient will work with CM team pharmacist to review medications-Met . Over the next 30 days, patient will work with [place] to address community resources/food insecurities-Met  Interventions:  . Inter-disciplinary care team collaboration (see longitudinal plan of care) . Advised patient to reschedule appointment for swallowing evaluation, discuss need for new CPAP at her appointment with Pulmonology, continue to work on cutting back on the number of cigarettes/day . Reviewed medications with patient and discussed her needing refill for iron and her difficulty swallowing pills . Reviewed scheduled/upcoming provider appointments including: 09/18/20  with Pulmonology-needs to reschedule and rescheduling swallow evaluation . Care Guide referral for food insecurities . Pharmacy referral for medication review/assistance . Provided patient with the [facility] community transportation number [phone] verified transportation benefits for Ms. [place]. [employer] will provide transportation for 75 miles one way, after that she would need prior authorization  Patient Goals/Self-Care Activities Over the next 30 days, patient will:  -Self administers medications as prescribed - Attends all scheduled provider appointments - Calls pharmacy for medication refills - Calls provider office for new concerns or questions - develop a new routine to improve sleep - eat healthy - get at least 7 to 8 hours of sleep at night - keep room cool and dark - practice relaxation or meditation daily - begin a notebook of services in my neighborhood or community - call 211 when I need some help - follow-up on any referrals for help I am given - make a list of family or friends that I can call - call [phone] for transportation to medical appointments.   Follow Up Plan: Telephone follow up appointment with Managed Medicaid care management team member scheduled for:10/30/20 @ 3:30pm  Follow Up:  Patient agrees to Care Plan and Follow-up.  Plan: The Managed Medicaid care management team will reach out to the patient again over the next 30 days.  Date/time of next scheduled RN care management/care coordination outreach:10/30/20 @ 3:30pm  Lurena Joiner RN, [place] RN Care Coordinator

## 2020-09-28 NOTE — Patient Instructions (Signed)
Visit Information  Ms. Golden was given information about Medicaid Managed Care team care coordination services as a part of their [place] Medicaid benefit. [place] verbally consented to engagement with the [facility] Managed Care team.   For questions related to your [facility], please call: [phone] or visit the homepage here: [email or web address]  If you would like to schedule transportation through your [facility], please call the following number at least 2 days in advance of your appointment: [phone]  Ms. Francois - following are the goals we discussed in your visit today:  Goals Addressed            This Visit's Progress   . Find Help in My Community       Timeframe:  Long-Range Goal Priority:  Medium Start Date:   08/17/20                          Expected End Date:  10/30/20                       - call [phone] for transportation to medical appointments. - begin a notebook of services in my neighborhood or community - call 211 when I need some help - follow-up on any referrals for help I am given - make a list of family or friends that I can call    Why is this important?    Knowing how and where to find help for yourself or family in your neighborhood and community is an important skill.   You will want to take some steps to learn how.            Please see education materials related to COPD provided by MyChart link.  Patient verbalizes understanding of instructions provided today.   Telephone follow up appointment with Managed Medicaid care management team member scheduled for:10/30/20 @ 3:30pm  [name], RN  Following is a copy of your plan of care:  Patient Care Plan: COPD (Adult)    Problem Identified: Disease Progression (COPD)     Long-Range Goal: Disease Progression Minimized or Managed   Start Date: 08/17/2020   Expected End Date: 10/17/2020  Recent Progress: On track  Priority: Medium  Note:   Current Barriers:  . Chronic Disease Management support and education needs related to COPD-Ms. Jarmon reports difficulty swallowing solids and liquids, missed her appointment for evaluation due to lack of transportation. She is not sleeping consistently at night-lost her CPAP during a recent move. She does not want to go into the hospital for a sleep study due to fear of Covid. She has not been vaccinated due to her many allergies. She is working on smoking cessation-down to 1/2 pack/day. Update-Ms. Kintz was unable to attend her appointment with pulmonology and would like to reschedule to the [place] office. She reports needing knee surgery, but needs to get her breathing under control first. . Film/video editor.  . Transportation barriers . Food Insecurities-Has food stamps, but tends to run out of food before the end of the month . Non-adherence to scheduled provider appointments-due to transportation barriers  Nurse Case Manager Clinical Goal(s):  Marland Kitchen Over the next 30 days, patient will verbalize understanding of plan for swallowing evaluation. Encouraged patient to call and reschedule appointment today.-Update-Patient has not had a chance to call to reschedule . Over the next 30 days, patient will meet with RN  Care Manager to address plan for sleep study to acquire new CPAP . Over the next 30 days, patient will attend all scheduled medical appointments: 09/18/20 with Pulmonology-missed appointment, plans to reschedule today at the [place] office . Over the next 30 days, patient will work with CM team pharmacist to review medications-Met . Over the next 30 days, patient will work with [place] to address community resources/food insecurities-Met  Interventions:  . Inter-disciplinary care team collaboration (see longitudinal plan of care) . Advised patient to reschedule appointment for swallowing  evaluation, discuss need for new CPAP at her appointment with Pulmonology, continue to work on cutting back on the number of cigarettes/day . Reviewed medications with patient and discussed her needing refill for iron and her difficulty swallowing pills . Reviewed scheduled/upcoming provider appointments including: 09/18/20 with Pulmonology-needs to reschedule and rescheduling swallow evaluation . Care Guide referral for food insecurities . Pharmacy referral for medication review/assistance . Provided patient with the [facility] community transportation number [phone]  Patient Goals/Self-Care Activities Over the next 30 days, patient will:  -Self administers medications as prescribed - Attends all scheduled provider appointments - Calls pharmacy for medication refills - Calls provider office for new concerns or questions - develop a new routine to improve sleep - eat healthy - get at least 7 to 8 hours of sleep at night - keep room cool and dark - practice relaxation or meditation daily - begin a notebook of services in my neighborhood or community - call 211 when I need some help - follow-up on any referrals for help I am given - make a list of family or friends that I can call   Follow Up Plan: Telephone follow up appointment with Managed Medicaid care management team member scheduled for:10/30/20 @ 3:30pm       Patient Care Plan: MM Disease Management    Problem Identified: Health Promotion or Disease Self-Management (General Plan of Care)     Long-Range Goal: MM Disease Management   Start Date: 08/21/2020  Expected End Date: 10/18/2020  This Visit's Progress: On track  Priority: Medium  Note:   Current Barriers:  . Patient has difficulty swallowing . Patient struggles with food o Has stamps but they run out by end of month . Smoking cessation  Pharmacist Clinical Goal(s):  Marland Kitchen Over the next 90 days, patient will  Contact Smoking Cessation hotline through [place] from phone  number  through collaboration with PharmD and provider.  .   Interventions: . Inter-disciplinary care team collaboration (see longitudinal plan of care) . Comprehensive medication review performed; medication list updated in electronic medical record  _0 @ _1 @  Patient Goals/Self-Care Activities . Over the next 90 days, patient will:  -  Work on Smoking Cessation  Follow Up Plan: The care management team will reach out to the patient again over the next 90 days.

## 2020-09-29 ENCOUNTER — Other Ambulatory Visit: Payer: Self-pay | Admitting: Family Medicine

## 2020-10-09 ENCOUNTER — Ambulatory Visit: Payer: Medicaid Other | Admitting: Neurology

## 2020-10-27 ENCOUNTER — Other Ambulatory Visit: Payer: Self-pay | Admitting: Family Medicine

## 2020-10-27 DIAGNOSIS — M545 Low back pain, unspecified: Secondary | ICD-10-CM

## 2020-10-30 ENCOUNTER — Other Ambulatory Visit: Payer: Self-pay | Admitting: *Deleted

## 2020-10-30 ENCOUNTER — Other Ambulatory Visit: Payer: Self-pay

## 2020-10-30 ENCOUNTER — Other Ambulatory Visit: Payer: Self-pay | Admitting: Family Medicine

## 2020-10-30 DIAGNOSIS — J441 Chronic obstructive pulmonary disease with (acute) exacerbation: Secondary | ICD-10-CM

## 2020-10-30 MED ORDER — GUAIFENESIN ER 600 MG PO TB12
1200.0000 mg | ORAL_TABLET | Freq: Two times a day (BID) | ORAL | 1 refills | Status: DC | PRN
Start: 2020-10-30 — End: 2021-01-22

## 2020-10-30 NOTE — Patient Outreach (Signed)
Medicaid Managed Care   Nurse Care Manager Note  10/30/2020 Name:  [name] MRN:  [id number] DOB:  [birth year]  [name] is an 65 y.o. year old female who is a primary patient of [name], [name], MD.  The [place] team was consulted for assistance with:    COPD  Ms. Hata was given information about [employer] team services today. [place] agreed to services and verbal consent obtained.  Engaged with patient by telephone for follow up visit in response to provider referral for case management and/or care coordination services.   Assessments/Interventions:  Review of past medical history, allergies, medications, health status, including review of consultants reports, laboratory and other test data, was performed as part of comprehensive evaluation and provision of chronic care management services.  SDOH (Social Determinants of Health) assessments and interventions performed:   Care Plan  Allergies  Allergen Reactions  . Amoxicillin Anaphylaxis    Breakout, mouth swells  . Azithromycin Swelling and Rash    "swelling all over, high, thrush"  . Cefdinir     Throat swelling after 3rd dose  . Chantix [Varenicline] Other (See Comments)    "bad dreams, difficulty breathing"  . Doxycycline Hyclate Anaphylaxis and Rash    swelling  . Levofloxacin Anaphylaxis  . Penicillins Anaphylaxis, Shortness Of Breath and Rash    "ALMOST DIED, ended up in hospital"  PCN reaction causing immediate rash, facial/tongue/throat swelling, SOB or lightheadedness with hypotension: YES PCN reaction causing severe rash involving mucus membranes or skin necrosis: NO PCN reaction that required hospitalization YES Has patient had a PCN reaction occurring within the last 10 years: NO   . Sulfonamide Derivatives Shortness Of Breath, Swelling and Rash    "break out from head to toe"  . Toradol [Ketorolac Tromethamine] Other (See Comments)     Seizure   . Clarithromycin Hives    swelling  . Nortriptyline Swelling, Rash and Other (See Comments)    Per patient had "kidney problems" on this medication, could not use bathroom (urinary retention)  . Codeine Nausea And Vomiting  . Fluticasone-Salmeterol Other (See Comments)    REACTION: ulcers in mouth  . Triptans Rash    Mouth breaks out and start itching    Medications Reviewed Today    Reviewed by [name], RN (Registered Nurse) on 10/30/20 at 1546  Med List Status: <None>  Medication Order Taking? Sig Documenting Provider Last Dose Status Informant  acetaminophen (TYLENOL) 500 MG tablet [id number] Yes Take 1,000 mg by mouth every other day. [provider] Taking Active Self  albuterol (PROAIR HFA) 108 (90 Base) MCG/ACT inhaler [id number] Yes INHALE TWO PUFFS INTO THE LUNGS EVERY 6 HOURS AS NEEDED FOR SHORTNESS OF BREATH [name], [name], MD Taking Active   albuterol (PROVENTIL) (2.5 MG/3ML) 0.083% nebulizer solution [id number] Yes USE 1 VIAL IN NEBULIZER EVERY 6 HOURS AS NEEDED FOR SHORTNESS OF BREATH [name], [name], MD Taking Active   aspirin EC 81 MG tablet [id number] Yes Take 81 mg by mouth daily. Swallow whole. [provider] Taking Active   calcium citrate (CALCITRATE) 950 (200 Ca) MG tablet [id number] Yes Take 1 tablet (200 mg of elemental calcium total) by mouth 2 (two) times daily. [name], [name], MD Taking Active   Cholecalciferol (VITAMIN D3) 50 MCG (2000 UT) capsule [id number] Yes TAKE ONE CAPSULE BY MOUTH DAILY [name], [name], MD Taking Active   Erenumab-aooe (AIMOVIG) 70 MG/ML SOAJ [id number] Yes INJECT 70MG INTO THE SKIN EVERY  14 DAYS [name], NP Taking Active   ferrous sulfate 325 (65 FE) MG tablet [id number] No Take 1 tablet (325 mg total) by mouth daily with breakfast.  Patient not taking: Reported on 10/30/2020   Eulis Foster, MD Not Taking Active   fluticasone (FLONASE) 50 MCG/ACT  nasal spray [id number] Yes SPRAY 2 SPRAYS INTO EACH NOSTRIL DAILY AS NEEDED FOR CONGESTION. [name], [name], MD Taking Active   guaiFENesin (MUCINEX) 600 MG 12 hr tablet [id number] No Take 2 tablets (1,200 mg total) by mouth 2 (two) times daily as needed.  Patient not taking: No sig reported   [name], [name], MD Not Taking Active   lamoTRIgine (LAMICTAL) 200 MG tablet [id number] Yes Take 1.5 tablets (300 mg total) by mouth 2 (two) times daily. [name], NP Taking Active   loratadine (CLARITIN) 10 MG tablet [id number] Yes Take 1 tablet (10 mg total) by mouth daily. [name], [name], MD Taking Active   meloxicam (MOBIC) 15 MG tablet [id number] Yes TAKE ONE TABLET BY MOUTH EVERY DAY [name], [name], MD Taking Active   mometasone-formoterol (DULERA) 200-5 MCG/ACT AERO [id number] Yes INHALE TWO PUFFS INTO THE LUNGS TWICE DAILY [name], [name], MD Taking Active   NON FORMULARY [id number]  Use CPAP at bedtime  Patient not taking: No sig reported   [provider]  Active   omeprazole (PRILOSEC) 40 MG capsule [id number] Yes TAKE ONE CAPSULE BY MOUTH TWICE DAILY [name], [name], MD Taking Active   polyethylene glycol powder (GLYCOLAX/MIRALAX) powder [id number] No MIX 17 GMS IN LIQUID AND DRINK ONCE DAILY  Patient not taking: No sig reported   [name], MD Not Taking Active   QUEtiapine (SEROQUEL) 100 MG tablet [id number] Yes Take 1 tablet (100 mg total) by mouth at bedtime. [name], NP Taking Active   sertraline (ZOLOFT) 50 MG tablet [id number] Yes Take 1 tablet (50 mg total) by mouth daily. [name], [name], MD Taking Active   simvastatin (ZOCOR) 40 MG tablet [id number] Yes Take 1 tablet (40 mg total) by mouth at bedtime. [name], [name], MD Taking Active   topiramate (TOPAMAX) 100 MG tablet [id number] Yes TAKE ONE TABLET BY MOUTH EVERY MORNING AND 2 TABLETS AT BEDTIME [name], NP Taking Active            Patient Active Problem List   Diagnosis Date Noted  . Encounter for wheelchair assessment 05/16/2020  . Healthcare maintenance 05/16/2020  . Shortness of breath 07/26/2019  . Tobacco use disorder 07/02/2018  . Elevated serum creatinine 06/01/2018  . Weakness 05/15/2017  . Chronic bronchitis ([place]) 01/14/2017  . Claudication ([place]) 04/09/2016  . Seizure disorder ([place]) 11/12/2015  . 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 Chronic obstructive pulmonary disease ([place]) 04/25/2015  . Dizziness 10/24/2014  . Pulmonary hypertension ([place]) 08/06/2013  . Diastolic CHF ([place]) 48/88/9169  . BPPV (benign paroxysmal positional vertigo) 03/09/2013  . Chronic respiratory failure ([place]) 11/01/2012  . Left shoulder pain 06/27/2012  . ANXIETY 02/13/2010  . Obstructive sleep apnea 02/19/2009  . Vitamin D deficiency 01/21/2008  . OSTEOPENIA 11/24/2007  . Irritable bowel syndrome 11/17/2007  . Allergic rhinitis 09/24/2007  . Hyperlipidemia 10/08/2006  . OBESITY, NOS  BMI 33.8 10/08/2006  . GLAUCOMA 10/08/2006  . GASTROESOPHAGEAL REFLUX, NO ESOPHAGITIS 10/08/2006  . HERNIA, HIATAL, NONCONGENITAL 10/08/2006  . INCONTINENCE, STRESS, FEMALE 10/08/2006  . DJD (degenerative joint disease) of knee 10/08/2006    Conditions to be addressed/monitored per PCP order:  COPD  Care Plan :  COPD (Adult)  Updates made by [name], RN since 10/30/2020 12:00 AM    Problem: Disease Progression (COPD)     Long-Range Goal: Disease Progression Minimized or Managed   Start Date: 08/17/2020  Expected End Date: 10/17/2020  Recent Progress: On track  Priority: Medium  Note:   Current Barriers:  . Chronic Disease Management support and education needs related to COPD-Ms. Tanney reports difficulty swallowing solids and liquids, missed her appointment for evaluation due to lack of transportation. She is not sleeping consistently at night-lost her CPAP during a recent move.  She does not want to go into the hospital for a sleep study due to fear of Covid. She has not been vaccinated due to her many allergies. She is working on smoking cessation-down to 1/2 pack/day. Update- Ms. Cacioppo continues to work on smoking cessation, down to 6 cig/day. She is having increased cough and congestion this week and feels she has bronchitis(reports she has bronchitis frequently). She has an appointment with her PCP 11/12/20 and is unable to go before then. Rescheduled her neurology appointment to June and has not rescheduled ENT appointment.  . Film/video editor.  . Transportation barriers . Food Insecurities-Has food stamps, but tends to run out of food before the end of the month . Non-adherence to scheduled provider appointments-due to transportation barriers  Nurse Case Manager Clinical Goal(s):  Marland Kitchen Over the next 30 days, patient will verbalize understanding of plan for swallowing evaluation. Encouraged patient to call and reschedule appointment today.-Update-Patient plans to call when she feels better . Over the next 30 days, patient will meet with RN Care Manager to address plan for sleep study to acquire new CPAP-Patient would like an in home sleep study and plans to inquire at her next neurology appointment . Over the next 30 days, patient will attend all scheduled medical appointments . Over the next 30 days, patient will work with CM team pharmacist to review medications-Met . Over the next 30 days, patient will work with [place] to address community resources/food insecurities-Met  Interventions:  . Inter-disciplinary care team collaboration (see longitudinal plan of care) . Advised patient to reschedule appointment for swallowing evaluation, discuss need for new CPAP at her appointment with Pulmonology, continue to work on cutting back on the number of cigarettes/day . Reviewed medications with patient and discussed her needing refill for iron  . Reviewed  scheduled/upcoming provider appointments including: 11/12/20 with PCP, Neurology in June . Provided patient with the [facility] community transportation number [phone] verified transportation benefits for Ms. Ayr. [employer] will provide transportation for 75 miles one way, after that she would need prior authorization . Collaborated with PCP for guaifenesin refill  Patient Goals/Self-Care Activities Over the next 30 days, patient will: - call [phone] for transportation to medical appointments. - begin a notebook of services in my neighborhood or community - call 211 when I need some help - follow-up on any referrals for help I am given, call Dr. Benjamine Mola [phone] to schedule an appointment for ENT - make a list of family or friends that I can call  - develop a new routine to improve sleep - eat healthy - get at least 7 to 8 hours of sleep at night - keep room cool and dark - practice relaxation or meditation daily   Follow Up Plan: Telephone follow up appointment with Managed Medicaid care management team member scheduled for:11/13/20 @ 3:30pm         Follow Up:  Patient agrees to Care  Plan and Follow-up.  Plan: The Managed Medicaid care management team will reach out to the patient again over the next 14 days.  Date/time of next scheduled RN care management/care coordination outreach: 11/13/20@ 3:30pm  Lurena Joiner RN, BSN [place] RN Care Coordinator

## 2020-10-30 NOTE — Patient Instructions (Signed)
Visit Information  [name] was given information about Medicaid Managed Care team care coordination services as a part of their [place] Medicaid benefit. [place] verbally consented to engagement with the [facility] Managed Care team.   For questions related to your [facility], please call: [phone] or visit the homepage here: [email or web address]  If you would like to schedule transportation through your [facility], please call the following number at least 2 days in advance of your appointment: [phone].   Call the [place] at [phone], at any time, 24 hours a day, 7 days a week. If you are in danger or need immediate medical attention call 911.  [name] - following are the goals we discussed in your visit today:  Goals Addressed            This Visit's Progress   . Find Help in My Community       Timeframe:  Long-Range Goal Priority:  Medium Start Date:   08/17/20                          Expected End Date:  11/13/20                      Follow up 11/13/20   - call [phone] for transportation to medical appointments. - begin a notebook of services in my neighborhood or community - call 211 when I need some help - follow-up on any referrals for help I am given, call Dr. Benjamine Mola [phone] to schedule an appointment for ENT - make a list of family or friends that I can call    Why is this important?    Knowing how and where to find help for yourself or family in your neighborhood and community is an important skill.   You will want to take some steps to learn how.         . Manage Fatigue (Tiredness-COPD)       Follow up 11/13/20   - develop a new routine to improve sleep - eat healthy - get at least 7 to 8 hours of sleep at night - keep room cool and dark - practice relaxation or meditation daily    Why is  this important?    Feeling tired or worn out is a common symptom of COPD (chronic obstructive pulmonary disease).   Learning when you feel your best and when you need rest is important.   Managing the tiredness (fatigue) will help you be active and enjoy life.            Please see education materials related to iron rich foods provided as print materials.     Iron-Rich Diet  Iron is a mineral that helps your body to produce hemoglobin. Hemoglobin is a protein in red blood cells that carries oxygen to your body's tissues. Eating too little iron may cause you to feel weak and tired, and it can increase your risk of infection. Iron is naturally found in many foods, and many foods have iron added to them (iron-fortified foods). You may need to follow an iron-rich diet if you do not have enough iron in your body due to certain medical conditions. The amount of iron that you need each day depends on your age, your sex, and any medical conditions you have. Follow instructions from your health care provider or a diet  and nutrition specialist (dietitian) about how much iron you should eat each day. What are tips for following this plan? Reading food labels  Check food labels to see how many milligrams (mg) of iron are in each serving. Cooking  Cook foods in pots and pans that are made from iron.  Take these steps to make it easier for your body to absorb iron from certain foods: ? Soak beans overnight before cooking. ? Soak whole grains overnight and drain them before using. ? Ferment flours before baking, such as by using yeast in bread dough. Meal planning  When you eat foods that contain iron, you should eat them with foods that are high in vitamin C. These include oranges, peppers, tomatoes, potatoes, and mango. Vitamin C helps your body to absorb iron. General information  Take iron supplements only as told by your health care provider. An overdose of iron can be life-threatening.  If you were prescribed iron supplements, take them with orange juice or a vitamin C supplement.  When you eat iron-fortified foods or take an iron supplement, you should also eat foods that naturally contain iron, such as meat, poultry, and fish. Eating naturally iron-rich foods helps your body to absorb the iron that is added to other foods or contained in a supplement.  Certain foods and drinks prevent your body from absorbing iron properly. Avoid eating these foods in the same meal as iron-rich foods or with iron supplements. These foods include: ? Coffee, black tea, and red wine. ? Milk, dairy products, and foods that are high in calcium. ? Beans and soybeans. ? Whole grains. What foods should I eat? Fruits Prunes. Raisins. Eat fruits high in vitamin C, such as oranges, grapefruits, and strawberries, alongside iron-rich foods. Vegetables Spinach (cooked). Green peas. Broccoli. Fermented vegetables. Eat vegetables high in vitamin C, such as leafy greens, potatoes, bell peppers, and tomatoes, alongside iron-rich foods. Grains Iron-fortified breakfast cereal. Iron-fortified whole-wheat bread. Enriched rice. Sprouted grains. Meats and other proteins Beef liver. Oysters. Beef. Shrimp. [place]. Chicken. [place]. Sardines. Chickpeas. Nuts. Tofu. Pumpkin seeds. Beverages Tomato juice. Fresh orange juice. Prune juice. Hibiscus tea. Fortified instant breakfast shakes. Sweets and desserts Blackstrap molasses. Seasonings and condiments Tahini. Fermented soy sauce. Other foods Wheat germ. The items listed above may not be a complete list of recommended foods and beverages. Contact a dietitian for more information. What foods should I avoid? Grains Whole grains. Bran cereal. Bran flour. Oats. Meats and other proteins Soybeans. Products made from soy protein. Black beans. Lentils. Mung beans. Split peas. Dairy Milk. Cream. Cheese. Yogurt. Cottage cheese. Beverages Coffee. Black tea. Red  wine. Sweets and desserts Cocoa. Chocolate. Ice cream. Other foods Basil. Oregano. Large amounts of parsley. The items listed above may not be a complete list of foods and beverages to avoid. Contact a dietitian for more information. Summary  Iron is a mineral that helps your body to produce hemoglobin. Hemoglobin is a protein in red blood cells that carries oxygen to your body's tissues.  Iron is naturally found in many foods, and many foods have iron added to them (iron-fortified foods).  When you eat foods that contain iron, you should eat them with foods that are high in vitamin C. Vitamin C helps your body to absorb iron.  Certain foods and drinks prevent your body from absorbing iron properly, such as whole grains and dairy products. You should avoid eating these foods in the same meal as iron-rich foods or with iron supplements. This information is  not intended to replace advice given to you by your health care provider. Make sure you discuss any questions you have with your health care provider. Document Revised: 07/10/2017 Document Reviewed: 06/23/2017 Elsevier Patient Education  2021 [employer].   The patient verbalized understanding of instructions provided today and agreed to receive a mailed copy of patient instruction and/or educational materials.  Telephone follow up appointment with Managed Medicaid care management team member scheduled for:11/13/20 @ 3:30pm  Lurena Joiner RN, BSN [place] RN Care Coordinator   Following is a copy of your plan of care:  Patient Care Plan: COPD (Adult)    Problem Identified: Disease Progression (COPD)     Long-Range Goal: Disease Progression Minimized or Managed   Start Date: 08/17/2020  Expected End Date: 10/17/2020  Recent Progress: On track  Priority: Medium  Note:   Current Barriers:  . Chronic Disease Management support and education needs related to COPD-[name] reports difficulty swallowing  solids and liquids, missed her appointment for evaluation due to lack of transportation. She is not sleeping consistently at night-lost her CPAP during a recent move. She does not want to go into the hospital for a sleep study due to fear of Covid. She has not been vaccinated due to her many allergies. She is working on smoking cessation-down to 1/2 pack/day. Update- [name] continues to work on smoking cessation, down to 6 cig/day. She is having increased cough and congestion this week and feels she has bronchitis(reports she has bronchitis frequently). She has an appointment with her PCP 11/12/20 and is unable to go before then. Rescheduled her neurology appointment to June and has not rescheduled ENT appointment.  . Film/video editor.  . Transportation barriers . Food Insecurities-Has food stamps, but tends to run out of food before the end of the month . Non-adherence to scheduled provider appointments-due to transportation barriers  Nurse Case Manager Clinical Goal(s):  Marland Kitchen Over the next 30 days, patient will verbalize understanding of plan for swallowing evaluation. Encouraged patient to call and reschedule appointment today.-Update-Patient plans to call when she feels better . Over the next 30 days, patient will meet with RN Care Manager to address plan for sleep study to acquire new CPAP-Patient would like an in home sleep study and plans to inquire at her next neurology appointment . Over the next 30 days, patient will attend all scheduled medical appointments . Over the next 30 days, patient will work with CM team pharmacist to review medications-Met . Over the next 30 days, patient will work with [place] to address community resources/food insecurities-Met  Interventions:  . Inter-disciplinary care team collaboration (see longitudinal plan of care) . Advised patient to reschedule appointment for swallowing evaluation, discuss need for new CPAP at her appointment with Pulmonology,  continue to work on cutting back on the number of cigarettes/day . Reviewed medications with patient and discussed her needing refill for iron  . Reviewed scheduled/upcoming provider appointments including: 11/12/20 with PCP, Neurology in June . Provided patient with the [facility] community transportation number [phone] verified transportation benefits for [name]. [employer] will provide transportation for 75 miles one way, after that she would need prior authorization . Collaborated with PCP for guaifenesin refill  Patient Goals/Self-Care Activities Over the next 30 days, patient will: - call [phone] for transportation to medical appointments. - begin a notebook of services in my neighborhood or community - call 211 when I need some help - follow-up on any referrals for help  I am given, call Dr. Benjamine Mola [phone] to schedule an appointment for ENT - make a list of family or friends that I can call  - develop a new routine to improve sleep - eat healthy - get at least 7 to 8 hours of sleep at night - keep room cool and dark - practice relaxation or meditation daily   Follow Up Plan: Telephone follow up appointment with Managed Medicaid care management team member scheduled for:11/13/20 @ 3:30pm       Patient Care Plan: MM Disease Management    Problem Identified: Health Promotion or Disease Self-Management (General Plan of Care)     Long-Range Goal: MM Disease Management   Start Date: 08/21/2020  Expected End Date: 10/18/2020  This Visit's Progress: On track  Priority: Medium  Note:   Current Barriers:  . Patient has difficulty swallowing . Patient struggles with food o Has stamps but they run out by end of month . Smoking cessation  Pharmacist Clinical Goal(s):  Marland Kitchen Over the next 90 days, patient will  Contact Smoking Cessation hotline through [place] from phone number  through collaboration with PharmD and provider.  .   Interventions: . Inter-disciplinary care team  collaboration (see longitudinal plan of care) . Comprehensive medication review performed; medication list updated in electronic medical record  '@[email or web address]' @ '@[email or web address]' @  Patient Goals/Self-Care Activities . Over the next 90 days, patient will:  -  Work on Smoking Cessation  Follow Up Plan: The care management team will reach out to the patient again over the next 90 days.

## 2020-10-31 ENCOUNTER — Ambulatory Visit: Payer: Medicaid Other | Admitting: Neurology

## 2020-11-11 NOTE — Progress Notes (Signed)
    SUBJECTIVE:   CHIEF COMPLAINT / HPI: left arm pain   Left Arm Pain  Patient reports pain in her left arm that makes it hard for her to move the arm. It has been painful for a few months. Denies any swelling or skin changes. She reports having a Steroid shot before in her shoulder and it made arm pain worse. She reports a sharp pain from the left side of her neck down to her left wrist. Denies any numbness, reports constant pain that hurts more when extending her arm. 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   Cough  Bronchitis  Patient reports having cough increased two months ago. She has been trying benadryl and tylenol. She reports cough, rhinorrhea and yellow mucous. She has green sputum that is more than normal. She reports that she 'stopped breathing' while in the bathroom and is having a hard time breathing. Denies sick contacts.   PERTINENT  PMH / PSH:  COPD    OBJECTIVE:   BP 118/80   Pulse 94   Wt 188 lb (85.3 kg)   SpO2 93%   BMI 35.52 kg/m   General: female appearing stated age in no acute distress, seated in wheelchair  Extremities: No peripheral edema. Warm/ well perfused.  Pulm: diffuse and bilateral expiratory and inspiratory wheezing, rhonchi present, normal WOB on RA   Neuro: pt alert and oriented x4   ASSESSMENT/PLAN:   Chronic obstructive pulmonary disease (HCC) Increased sputum production, dyspnea and cough without fever/chills.   -We will treat patient for COPD exacerbation with clindamycin given her list of medication allergies.   -We will also treat with 40 mg steroid daily for 5 days.  Antibiotics to continue for 7-day course.  Radiculopathy affecting upper extremity Arm pain described as shooting pain from left side of neck left wrist.  Most consistent with radiculopathy.  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  Patient has not had recent trauma to the arm pain has been present for months and progressively worsening, do not believe management would benefit from xrays at this time. -We will treat with gabapentin 100 nightly     Eulis Foster, MD [place]

## 2020-11-12 ENCOUNTER — Other Ambulatory Visit: Payer: Self-pay

## 2020-11-12 ENCOUNTER — Ambulatory Visit (INDEPENDENT_AMBULATORY_CARE_PROVIDER_SITE_OTHER): Payer: Medicaid Other | Admitting: Family Medicine

## 2020-11-12 ENCOUNTER — Encounter: Payer: Self-pay | Admitting: Family Medicine

## 2020-11-12 VITALS — BP 118/80 | HR 94 | Wt 188.0 lb

## 2020-11-12 DIAGNOSIS — G4733 Obstructive sleep apnea (adult) (pediatric): Secondary | ICD-10-CM | POA: Diagnosis not present

## 2020-11-12 DIAGNOSIS — M79602 Pain in left arm: Secondary | ICD-10-CM

## 2020-11-12 DIAGNOSIS — M5410 Radiculopathy, site unspecified: Secondary | ICD-10-CM | POA: Diagnosis not present

## 2020-11-12 DIAGNOSIS — J441 Chronic obstructive pulmonary disease with (acute) exacerbation: Secondary | ICD-10-CM

## 2020-11-12 DIAGNOSIS — I272 Pulmonary hypertension, unspecified: Secondary | ICD-10-CM

## 2020-11-12 MED ORDER — CLINDAMYCIN HCL 300 MG PO CAPS: 300.0000 mg | ORAL_CAPSULE | Freq: Three times a day (TID) | ORAL | 0 refills | Status: DC

## 2020-11-12 MED ORDER — GABAPENTIN 100 MG PO CAPS: 100.0000 mg | ORAL_CAPSULE | Freq: Every day | ORAL | 3 refills | Status: DC

## 2020-11-12 MED ORDER — PREDNISONE 20 MG PO TABS: 40.0000 mg | ORAL_TABLET | Freq: Every day | ORAL | 0 refills | Status: DC

## 2020-11-12 MED ORDER — FERROUS SULFATE 324 (65 FE) MG PO TBEC: 324.0000 mg | DELAYED_RELEASE_TABLET | ORAL | 0 refills | Status: DC

## 2020-11-12 NOTE — Patient Instructions (Addendum)
For your COPD flare, I have prescribed a course of steroids and an antibiotic. Please take the Clindamycin three times daily and prednisone once daily until both are completely out.   I have also submitted a referral for your pulmonologist and eating.  Please be on the look out for a call within the next 2 weeks in order to discuss scheduling appointment.  Please follow-up with me in the next 3-4 weeks.    COPD and Physical Activity Chronic obstructive pulmonary disease (COPD) is a long-term (chronic) condition that affects the lungs. COPD is a general term that can be used to describe many different lung problems that cause lung swelling (inflammation) and limit airflow, including chronic bronchitis and emphysema. The main symptom of COPD is shortness of breath, which makes it harder to do even simple tasks. This can also make it harder to exercise and be active. Talk with your health care provider about treatments to help you breathe better and actions you can take to prevent breathing problems during physical activity. What are the benefits of exercising with COPD? Exercising regularly is an important part of a healthy lifestyle. You can still exercise and do physical activities even though you have COPD. Exercise and physical activity improve your shortness of breath by increasing blood flow (circulation). This causes your heart to pump more oxygen through your body. Moderate exercise can improve your:  Oxygen use.  Energy level.  Shortness of breath.  Strength in your breathing muscles.  Heart health.  Sleep.  Self-esteem and feelings of self-worth.  Depression, stress, and anxiety levels. Exercise can benefit everyone with COPD. The severity of your disease may affect how hard you can exercise, especially at first, but everyone can benefit. Talk with your health care provider about how much exercise is safe for you, and which activities and exercises are safe for you.   What  actions can I take to prevent breathing problems during physical activity?  Sign up for a pulmonary rehabilitation program. This type of program may include: ? Education about lung diseases. ? Exercise classes that teach you how to exercise and be more active while improving your breathing. This usually involves:  Exercise using your lower extremities, such as a stationary bicycle.  About 30 minutes of exercise, 2 to 5 times per week, for 6 to 12 weeks  Strength training, such as push ups or leg lifts. ? Nutrition education. ? Group classes in which you can talk with others who also have COPD and learn ways to manage stress.  If you use an oxygen tank, you should use it while you exercise. Work with your health care provider to adjust your oxygen for your physical activity. Your resting flow rate is different from your flow rate during physical activity.  While you are exercising: ? Take slow breaths. ? Pace yourself and do not try to go too fast. ? Purse your lips while breathing out. Pursing your lips is similar to a kissing or whistling position. ? If doing exercise that uses a quick burst of effort, such as weight lifting:  Breathe in before starting the exercise.  Breathe out during the hardest part of the exercise (such as raising the weights). Where to find support You can find support for exercising with COPD from:  Your health care provider.  A pulmonary rehabilitation program.  Your local health department or community health programs.  Support groups, online or in-person. Your health care provider may be able to recommend support groups. Where  to find more information You can find more information about exercising with COPD from:  American Lung Association: ClassInsider.se.  COPD Foundation: https://www.rivera.net/. Contact a health care provider if:  Your symptoms get worse.  You have chest pain.  You have nausea.  You have a fever.  You have trouble talking or  catching your breath.  You want to start a new exercise program or a new activity. Summary  COPD is a general term that can be used to describe many different lung problems that cause lung swelling (inflammation) and limit airflow. This includes chronic bronchitis and emphysema.  Exercise and physical activity improve your shortness of breath by increasing blood flow (circulation). This causes your heart to provide more oxygen to your body.  Contact your health care provider before starting any exercise program or new activity. Ask your health care provider what exercises and activities are safe for you. This information is not intended to replace advice given to you by your health care provider. Make sure you discuss any questions you have with your health care provider. Document Revised: 11/17/2018 Document Reviewed: 08/20/2017 Elsevier Patient Education  2021 [employer].

## 2020-11-12 NOTE — Assessment & Plan Note (Addendum)
Increased sputum production, dyspnea and cough without fever/chills.   -We will treat patient for COPD exacerbation with clindamycin given her list of medication allergies.   -We will also treat with 40 mg steroid daily for 5 days.  Antibiotics to continue for 7-day course.

## 2020-11-12 NOTE — Assessment & Plan Note (Addendum)
Arm pain described as shooting pain from left side of neck left wrist.  Most consistent with radiculopathy.  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  Patient has not had recent trauma to the arm pain has been present for months and progressively worsening, do not believe management would benefit from xrays at this time. -We will treat with gabapentin 100 nightly

## 2020-11-13 ENCOUNTER — Other Ambulatory Visit: Payer: Self-pay | Admitting: *Deleted

## 2020-11-13 NOTE — Patient Instructions (Signed)
Visit Information  [name] was given information about Medicaid Managed Care team care coordination services as a part of their [place] Medicaid benefit. [name] verbally consented to engagement with the [facility] Managed Care team.   For questions related to your [facility], please call: [phone] or visit the homepage here: [email or web address]  If you would like to schedule transportation through your [facility], please call the following number at least 2 days in advance of your appointment: [phone].   Call the [place] at [phone], at any time, 24 hours a day, 7 days a week. If you are in danger or need immediate medical attention call 911.  [name] - following are the goals we discussed in your visit today:  Goals Addressed            This Visit's Progress   . Find Help in My Community       Timeframe:  Long-Range Goal Priority:  Medium Start Date:   08/17/20                          Expected End Date:  12/24/20                      Follow up 12/24/20   - call [phone] for transportation to medical appointments. - begin a notebook of services in my neighborhood or community - call 211 when I need some help - follow-up on any referrals for help I am given, call Dr. Benjamine Mola [phone] to schedule an appointment for ENT - make a list of family or friends that I can call    Why is this important?    Knowing how and where to find help for yourself or family in your neighborhood and community is an important skill.   You will want to take some steps to learn how.         . Manage Fatigue (Tiredness-COPD)       Follow up 12/24/20   - develop a new routine to improve sleep - eat healthy - get at least 7 to 8 hours of sleep at night - keep room cool and dark - practice relaxation or meditation daily    Why is  this important?    Feeling tired or worn out is a common symptom of COPD (chronic obstructive pulmonary disease).   Learning when you feel your best and when you need rest is important.   Managing the tiredness (fatigue) will help you be active and enjoy life.            Please see education materials related to COPD provided by MyChart link.   Chronic Obstructive Pulmonary Disease Exacerbation Chronic obstructive pulmonary disease (COPD) is a long-term (chronic) lung problem. In COPD, the flow of air from the lungs is limited. COPD exacerbations are times that breathing gets worse and you need more than your normal treatment. Without treatment, they can be life threatening. If they happen often, your lungs can become more damaged. If your COPD gets worse, your doctor may treat you with:  Medicines.  Oxygen.  Different ways to clear your airway, such as using a mask. Follow these instructions at home: Medicines  Take over-the-counter and prescription medicines only as told by your doctor.  If you take an antibiotic or steroid medicine, do not stop taking the medicine even if you start  to feel better.  Keep up with shots (vaccinations) as told by your doctor. Be sure to get a yearly (annual) flu shot. Lifestyle  Do not smoke. If you need help quitting, ask your doctor.  Eat healthy foods.  Exercise regularly.  Get plenty of sleep.  Avoid tobacco smoke and other things that can bother your lungs.  Wash your hands often with soap and water. This will help keep you from getting an infection. If you cannot use soap and water, use hand sanitizer.  During flu season, avoid areas that are crowded with people. General instructions  Drink enough fluid to keep your pee (urine) clear or pale yellow. Do not do this if your doctor has told you not to.  Use a cool mist machine (vaporizer).  If you use oxygen or a machine that turns medicine into a mist (nebulizer), continue to  use it as told.  Follow all instructions for rehabilitation. These are steps you can take to make your body work better.  Keep all follow-up visits as told by your doctor. This is important. Contact a doctor if:  Your COPD symptoms get worse than normal. Get help right away if:  You are short of breath and it gets worse.  You have trouble talking.  You have chest pain.  You cough up blood.  You have a fever.  You keep throwing up (vomiting).  You feel weak or you pass out (faint).  You feel confused.  You are not able to sleep because of your symptoms.  You are not able to do daily activities. Summary  COPD exacerbations are times that breathing gets worse and you need more treatment than normal.  COPD exacerbations can be very serious and may cause your lungs to become more damaged.  Do not smoke. If you need help quitting, ask your doctor.  Stay up-to-date on your shots. Get a flu shot every year. This information is not intended to replace advice given to you by your health care provider. Make sure you discuss any questions you have with your health care provider. Document Revised: 07/10/2017 Document Reviewed: 09/01/2016 Elsevier Patient Education  2021 [employer].   Patient verbalizes understanding of instructions provided today.   Telephone follow up appointment with Managed Medicaid care management team member scheduled for:12/24/20 @ 3:30pm  Lurena Joiner RN, BSN [place] RN Care Coordinator   Following is a copy of your plan of care:  Patient Care Plan: COPD (Adult)    Problem Identified: Disease Progression (COPD)     Long-Range Goal: Disease Progression Minimized or Managed   Start Date: 08/17/2020  Expected End Date: 12/24/2020  This Visit's Progress: On track  Recent Progress: On track  Priority: Medium  Note:   Current Barriers:  . Chronic Disease Management support and education needs related to COPD-[name]  reports difficulty swallowing solids and liquids, missed her appointment for evaluation due to lack of transportation. She is not sleeping consistently at night-lost her CPAP during a recent move. She does not want to go into the hospital for a sleep study due to fear of Covid. She has not been vaccinated due to her many allergies. She is working on smoking cessation-down to 1/2 pack/day. Update- [name] continues to work on smoking cessation, down to 6 cig/day. She is having increased cough and congestion this week and feels she has bronchitis(reports she has bronchitis frequently). She had an appointment with her PCP 11/12/20 and is being treated  for COPD exacerbation with antibiotics and steroid. These medicines will be delivered to her home today. A referral for Pulmonology in [place] was placed by PCP. Patient rescheduled her neurology appointment to June and has not rescheduled ENT appointment. She received a call for epilepsy study, which she had forgotten about and will return the call to schedule.  . Film/video editor.  . Transportation barriers . Food Insecurities-Has food stamps, but tends to run out of food before the end of the month . Non-adherence to scheduled provider appointments-due to transportation barriers Nurse Case Manager Clinical Goal(s):  Marland Kitchen Over the next 30 days, patient will verbalize understanding of plan for swallowing evaluation. Encouraged patient to call and reschedule appointment today.-Update-Patient plans to call when she feels better . Over the next 30 days, patient will meet with RN Care Manager to address plan for sleep study to acquire new CPAP-Patient would like an in home sleep study and plans to inquire at her next neurology appointment . Over the next 30 days, patient will attend all scheduled medical appointments . Over the next 30 days, patient will work with CM team pharmacist to review medications-[facility] . Over the next 30 days, patient will work with [place] to address community resources/food insecurities-Met Interventions:  . Inter-disciplinary care team collaboration (see longitudinal plan of care) . Advised patient to reschedule appointment for swallowing evaluation, discuss need for new CPAP at her appointment with Pulmonology, continue to work on cutting back on the number of cigarettes/day . Reviewed medications with patient and discussed taking newly prescribed antibiotic and steroid as directed . Reviewed scheduled/upcoming provider appointments including:  Neurology in June . Encouraged patient to notifiy PCP if symptoms worsen or do not improve after taking prescribed medication . Provided education on COPD Patient Goals/Self-Care Activities Over the next 30 days, patient will: - call [phone] for transportation to medical appointments. - begin a notebook of services in my neighborhood or community - call 211 when I need some help - follow-up on any referrals for help I am given, call Dr. Benjamine Mola [phone] to schedule an appointment for ENT - make a list of family or friends that I can call  - develop a new routine to improve sleep - eat healthy - get at least 7 to 8 hours of sleep at night - keep room cool and dark - practice relaxation or meditation daily   Follow Up Plan: Telephone follow up appointment with Managed Medicaid care management team member scheduled for:12/24/20 @ 3:30pm       Patient Care Plan: MM Disease Management    Problem Identified: Health Promotion or Disease Self-Management (General Plan of Care)     Long-Range Goal: MM Disease Management   Start Date: 08/21/2020  Expected End Date: 10/18/2020  This Visit's Progress: On track  Priority: Medium  Note:   Current Barriers:  . Patient has difficulty swallowing . Patient struggles with food o Has stamps but they run out by end of month . Smoking cessation  Pharmacist Clinical Goal(s):  Marland Kitchen Over the next 90 days, patient will  Contact  Smoking Cessation hotline through [place] from phone number  through collaboration with PharmD and provider.  .   Interventions: . Inter-disciplinary care team collaboration (see longitudinal plan of care) . Comprehensive medication review performed; medication list updated in electronic medical record  _0 @ _1 @  Patient Goals/Self-Care Activities . Over the next 90 days, patient will:  -  Work on Smoking Cessation  Follow Up Plan: The care  management team will reach out to the patient again over the next 90 days.

## 2020-11-13 NOTE — Patient Outreach (Signed)
Medicaid Managed Care   Nurse Care Manager Note  11/13/2020 Name:  [name] MRN:  [id number] DOB:  06-01-1956  [name] is an 65 y.o. year old female who is a primary patient of [name].  The [facility] team was consulted for assistance with:    COPD  [name] was given information about [employer] team services today. [name] agreed to services and verbal consent obtained.  Engaged with patient by telephone for follow up visit in response to provider referral for case management and/or care coordination services.   Assessments/Interventions:  Review of past medical history, allergies, medications, health status, including review of consultants reports, laboratory and other test data, was performed as part of comprehensive evaluation and provision of chronic care management services.  SDOH (Social Determinants of Health) assessments and interventions performed:   Care Plan  Allergies  Allergen Reactions  . Amoxicillin Anaphylaxis    Breakout, mouth swells  . Azithromycin Swelling and Rash    "swelling all over, high, thrush"  . Cefdinir     Throat swelling after 3rd dose  . Chantix [Varenicline] Other (See Comments)    "bad dreams, difficulty breathing"  . Doxycycline Hyclate Anaphylaxis and Rash    swelling  . Levofloxacin Anaphylaxis  . Penicillins Anaphylaxis, Shortness Of Breath and Rash    "ALMOST DIED, ended up in hospital"  PCN reaction causing immediate rash, facial/tongue/throat swelling, SOB or lightheadedness with hypotension: YES PCN reaction causing severe rash involving mucus membranes or skin necrosis: NO PCN reaction that required hospitalization YES Has patient had a PCN reaction occurring within the last 10 years: NO   . Sulfonamide Derivatives Shortness Of Breath, Swelling and Rash    "break out from head to toe"  . Toradol [Ketorolac Tromethamine] Other (See Comments)     Seizure   . Clarithromycin Hives    swelling  . Nortriptyline Swelling, Rash and Other (See Comments)    Per patient had "kidney problems" on this medication, could not use bathroom (urinary retention)  . Codeine Nausea And Vomiting  . Fluticasone-Salmeterol Other (See Comments)    REACTION: ulcers in mouth  . Triptans Rash    Mouth breaks out and start itching    Medications Reviewed Today    Reviewed by [name], RN (Registered Nurse) on 11/13/20 at 1545  Med List Status: <None>  Medication Order Taking? Sig Documenting Provider Last Dose Status Informant  acetaminophen (TYLENOL) 500 MG tablet [id number] Yes Take 1,000 mg by mouth every other day. Provider, Historical, [name] Taking Active Self  albuterol (PROAIR HFA) 108 (90 Base) MCG/ACT inhaler [id number] Yes INHALE TWO PUFFS INTO THE LUNGS EVERY 6 HOURS AS NEEDED FOR SHORTNESS OF BREATH [name] Taking Active   albuterol (PROVENTIL) (2.5 MG/3ML) 0.083% nebulizer solution [id number] Yes USE 1 VIAL IN NEBULIZER EVERY 6 HOURS AS NEEDED FOR SHORTNESS OF BREATH [name] Taking Active   aspirin EC 81 MG tablet [id number] Yes Take 81 mg by mouth daily. Swallow whole. Provider, Historical, [name] Taking Active   calcium citrate (CALCITRATE) 950 (200 Ca) MG tablet [id number] Yes Take 1 tablet (200 mg of elemental calcium total) by mouth 2 (two) times daily. [name] Taking Active   Cholecalciferol (VITAMIN D3) 50 MCG (2000 UT) capsule [id number] Yes TAKE ONE CAPSULE BY MOUTH DAILY [name] Taking Active   clindamycin (CLEOCIN) 300 MG capsule [id number]  Take 1 capsule (300 mg total)  by mouth 3 (three) times daily. [name]  Active            Med Note ([name]   Tue Nov 13, 2020  3:43 PM) Waiting for medication to be delivered  [facility] (AIMOVIG) 70 MG/ML SOAJ [id number] Yes INJECT 70MG INTO THE SKIN EVERY 14 DAYS [name], NP Taking  Active   ferrous sulfate 324 (65 Fe) MG TBEC [id number] Yes Take 1 tablet (324 mg total) by mouth every other day. [name] Taking Active   fluticasone (FLONASE) 50 MCG/ACT nasal spray [id number] Yes SPRAY 2 SPRAYS INTO EACH NOSTRIL DAILY AS NEEDED FOR CONGESTION. [name] Taking Active   gabapentin (NEURONTIN) 100 MG capsule [id number]  Take 1 capsule (100 mg total) by mouth at bedtime. [name]  Active            Med Note ([name]   Tue Nov 13, 2020  3:42 PM) Waiting for medication to be delivered  guaiFENesin (MUCINEX) 600 MG 12 hr tablet [id number] Yes Take 2 tablets (1,200 mg total) by mouth 2 (two) times daily as needed for cough. [name] Taking Active   lamoTRIgine (LAMICTAL) 200 MG tablet [id number] Yes Take 1.5 tablets (300 mg total) by mouth 2 (two) times daily. [name], NP Taking Active   loratadine (CLARITIN) 10 MG tablet [id number] Yes Take 1 tablet (10 mg total) by mouth daily. [name] Taking Active   meloxicam (MOBIC) 15 MG tablet [id number] Yes TAKE ONE TABLET BY MOUTH EVERY DAY [name] Taking Active   mometasone-formoterol [facility]) 200-5 MCG/ACT AERO [id number] Yes INHALE TWO PUFFS INTO THE LUNGS TWICE DAILY [name] Taking Active   NON FORMULARY [id number] No Use CPAP at bedtime  Patient not taking: No sig reported   Provider, Historical, [name] Not Taking Active   omeprazole (PRILOSEC) 40 MG capsule [id number] Yes TAKE ONE CAPSULE BY MOUTH TWICE DAILY [name] Taking Active   polyethylene glycol powder (GLYCOLAX/MIRALAX) powder [id number] Yes MIX 17 GMS IN LIQUID AND DRINK ONCE DAILY Ola Spurr, Sharman Cheek, [name] Taking Active   predniSONE (DELTASONE) 20 MG tablet [id number]  Take 2 tablets (40 mg total) by mouth daily with breakfast. [name]  Active            Med Note (Carra Brindley A   Tue  Nov 13, 2020  3:41 PM) Waiting for medication delivered  QUEtiapine (SEROQUEL) 100 MG tablet [id number] Yes Take 1 tablet (100 mg total) by mouth at bedtime. [name], NP Taking Active   sertraline (ZOLOFT) 50 MG tablet [id number] Yes Take 1 tablet (50 mg total) by mouth daily. [name] Taking Active   simvastatin (ZOCOR) 40 MG tablet [id number] Yes Take 1 tablet (40 mg total) by mouth at bedtime. [name] Taking Active   topiramate (TOPAMAX) 100 MG tablet [id number] Yes TAKE ONE TABLET BY MOUTH EVERY MORNING AND 2 TABLETS AT BEDTIME [name], NP Taking Active           Patient Active Problem List   Diagnosis Date Noted  . Radiculopathy affecting upper extremity 11/12/2020  . Encounter for wheelchair assessment 05/16/2020  . Healthcare maintenance 05/16/2020  . Shortness of breath 07/26/2019  . Tobacco use disorder 07/02/2018  . Elevated serum creatinine 06/01/2018  . Weakness 05/15/2017  . Chronic bronchitis ([place]) 01/14/2017  . Claudication ([place]) 04/09/2016  . Seizure disorder ([place]) 11/12/2015  . Intractable  chronic migraine without aura with status migrainosus 05/23/2015  . Chronic obstructive pulmonary disease ([place]) 04/25/2015  . Dizziness 10/24/2014  . Pulmonary hypertension ([place]) 08/06/2013  . Diastolic CHF ([place]) 40/97/3532  . BPPV (benign paroxysmal positional vertigo) 03/09/2013  . Chronic respiratory failure ([place]) 11/01/2012  . Left shoulder pain 06/27/2012  . ANXIETY 02/13/2010  . Obstructive sleep apnea 02/19/2009  . Vitamin D deficiency 01/21/2008  . OSTEOPENIA 11/24/2007  . Irritable bowel syndrome 11/17/2007  . Allergic rhinitis 09/24/2007  . Hyperlipidemia 10/08/2006  . OBESITY, NOS  BMI 33.8 10/08/2006  . GLAUCOMA 10/08/2006  . GASTROESOPHAGEAL REFLUX, NO ESOPHAGITIS 10/08/2006  . HERNIA, HIATAL, NONCONGENITAL 10/08/2006  . INCONTINENCE, STRESS, FEMALE 10/08/2006  . DJD (degenerative joint disease) of knee  10/08/2006    Conditions to be addressed/monitored per PCP order:  COPD  Care Plan : COPD (Adult)  Updates made by [name], RN since 11/13/2020 12:00 AM    Problem: Disease Progression (COPD)     Long-Range Goal: Disease Progression Minimized or Managed   Start Date: 08/17/2020  Expected End Date: 12/24/2020  This Visit's Progress: On track  Recent Progress: On track  Priority: Medium  Note:   Current Barriers:  . Chronic Disease Management support and education needs related to COPD-Ms. Fiser reports difficulty swallowing solids and liquids, missed her appointment for evaluation due to lack of transportation. She is not sleeping consistently at night-lost her CPAP during a recent move. She does not want to go into the hospital for a sleep study due to fear of Covid. She has not been vaccinated due to her many allergies. She is working on smoking cessation-down to 1/2 pack/day. Update- Ms. Dupre continues to work on smoking cessation, down to 6 cig/day. She is having increased cough and congestion this week and feels she has bronchitis(reports she has bronchitis frequently). She had an appointment with her PCP 11/12/20 and is being treated for COPD exacerbation with antibiotics and steroid. These medicines will be delivered to her home today. A referral for Pulmonology in [place] was placed by PCP. Patient rescheduled her neurology appointment to June and has not rescheduled ENT appointment. She received a call for epilepsy study, which she had forgotten about and will return the call to schedule.  . Film/video editor.  . Transportation barriers . Food Insecurities-Has food stamps, but tends to run out of food before the end of the month . Non-adherence to scheduled provider appointments-due to transportation barriers Nurse Case Manager Clinical Goal(s):  Marland Kitchen Over the next 30 days, patient will verbalize understanding of plan for swallowing evaluation. Encouraged patient to call and  reschedule appointment today.-Update-Patient plans to call when she feels better . Over the next 30 days, patient will meet with RN Care Manager to address plan for sleep study to acquire new CPAP-Patient would like an in home sleep study and plans to inquire at her next neurology appointment . Over the next 30 days, patient will attend all scheduled medical appointments . Over the next 30 days, patient will work with CM team pharmacist to review medications-Met . Over the next 30 days, patient will work with [place] to address community resources/food insecurities-Met Interventions:  . Inter-disciplinary care team collaboration (see longitudinal plan of care) . Advised patient to reschedule appointment for swallowing evaluation, discuss need for new CPAP at her appointment with Pulmonology, continue to work on cutting back on the number of cigarettes/day . Reviewed medications with patient and discussed taking newly prescribed antibiotic and steroid as  directed . Reviewed scheduled/upcoming provider appointments including:  Neurology in June . Encouraged patient to notifiy PCP if symptoms worsen or do not improve after taking prescribed medication . Provided education on COPD Patient Goals/Self-Care Activities Over the next 30 days, patient will: - call [phone] for transportation to medical appointments. - begin a notebook of services in my neighborhood or community - call 211 when I need some help - follow-up on any referrals for help I am given, call Dr. Benjamine Mola [phone] to schedule an appointment for ENT - make a list of family or friends that I can call  - develop a new routine to improve sleep - eat healthy - get at least 7 to 8 hours of sleep at night - keep room cool and dark - practice relaxation or meditation daily   Follow Up Plan: Telephone follow up appointment with Managed Medicaid care management team member scheduled for:12/24/20 @ 3:30pm         Follow Up:   Patient agrees to Care Plan and Follow-up.  Plan: The Managed Medicaid care management team will reach out to the patient again over the next 30 days.  Date/time of next scheduled RN care management/care coordination outreach:  12/24/20 @ 3:30pm  Lurena Joiner RN, [place] RN Care Coordinator

## 2020-11-15 DIAGNOSIS — M79602 Pain in left arm: Secondary | ICD-10-CM | POA: Insufficient documentation

## 2020-11-15 NOTE — Assessment & Plan Note (Deleted)
Characterization and localization appears to be neuropathy/radiculopathy. No skin changes noted on exam, has normal ROM of limb without bony deformities or recent trauma warranting xrays.  - trial of gabapentin 100qHS

## 2020-11-19 ENCOUNTER — Other Ambulatory Visit: Payer: Self-pay

## 2020-11-19 NOTE — Patient Instructions (Signed)
Visit Information  [name] was given information about Medicaid Managed Care team care coordination services as a part of their [place] Medicaid benefit. [place] verbally consented to engagement with the [facility] Managed Care team.   For questions related to your [facility], please call: [phone] or visit the homepage here: [email or web address]  If you would like to schedule transportation through your [facility], please call the following number at least 2 days in advance of your appointment: [phone].   Call the [place] at [phone], at any time, 24 hours a day, 7 days a week. If you are in danger or need immediate medical attention call 911.  [name] - following are the goals we discussed in your visit today:  Goals Addressed   None     Please see education materials related to COPD provided as print materials.   Patient verbalizes understanding of instructions provided today.   The Managed Medicaid care management team will reach out to the patient again over the next 90 days.   [place] Constable, Pharm.D., Managed Medicaid Pharmacist [phone]   Following is a copy of your plan of care:  Patient Care Plan: COPD (Adult)    Problem Identified: Disease Progression (COPD)     Long-Range Goal: Disease Progression Minimized or Managed   Start Date: 08/17/2020  Expected End Date: 12/24/2020  This Visit's Progress: On track  Recent Progress: On track  Priority: Medium  Note:   Current Barriers:  . Chronic Disease Management support and education needs related to COPD-[name] reports difficulty swallowing solids and liquids, missed her appointment for evaluation due to lack of transportation. She is not sleeping consistently at night-lost her CPAP during a recent move. She does not want to go into the hospital for a  sleep study due to fear of Covid. She has not been vaccinated due to her many allergies. She is working on smoking cessation-down to 1/2 pack/day. Update- [name] continues to work on smoking cessation, down to 6 cig/day. She is having increased cough and congestion this week and feels she has bronchitis(reports she has bronchitis frequently). She had an appointment with her PCP 11/12/20 and is being treated for COPD exacerbation with antibiotics and steroid. These medicines will be delivered to her home today. A referral for Pulmonology in [place] was placed by PCP. Patient rescheduled her neurology appointment to June and has not rescheduled ENT appointment. She received a call for epilepsy study, which she had forgotten about and will return the call to schedule.  . Film/video editor.  . Transportation barriers . Food Insecurities-Has food stamps, but tends to run out of food before the end of the month . Non-adherence to scheduled provider appointments-due to transportation barriers Nurse Case Manager Clinical Goal(s):  Marland Kitchen Over the next 30 days, patient will verbalize understanding of plan for swallowing evaluation. Encouraged patient to call and reschedule appointment today.-Update-Patient plans to call when she feels better . Over the next 30 days, patient will meet with RN Care Manager to address plan for sleep study to acquire new CPAP-Patient would like an in home sleep study and plans to inquire at her next neurology appointment . Over the next 30 days, patient will attend all scheduled medical appointments . Over the next 30 days, patient will work with CM team pharmacist to review medications-Met . Over the next 30 days, patient will work with Care Guide to address community  resources/food insecurities-Met Interventions:  . Inter-disciplinary care team collaboration (see longitudinal plan of care) . Advised patient to reschedule appointment for swallowing evaluation, discuss need for new  CPAP at her appointment with Pulmonology, continue to work on cutting back on the number of cigarettes/day . Reviewed medications with patient and discussed taking newly prescribed antibiotic and steroid as directed . Reviewed scheduled/upcoming provider appointments including:  Neurology in June . Encouraged patient to notifiy PCP if symptoms worsen or do not improve after taking prescribed medication . Provided education on COPD Patient Goals/Self-Care Activities Over the next 30 days, patient will: - call [phone] for transportation to medical appointments. - begin a notebook of services in my neighborhood or community - call 211 when I need some help - follow-up on any referrals for help I am given, call Dr. Benjamine Mola [phone] to schedule an appointment for ENT - make a list of family or friends that I can call  - develop a new routine to improve sleep - eat healthy - get at least 7 to 8 hours of sleep at night - keep room cool and dark - practice relaxation or meditation daily   Follow Up Plan: Telephone follow up appointment with Managed Medicaid care management team member scheduled for:12/24/20 @ 3:30pm       Patient Care Plan: MM Disease Management    Problem Identified: Health Promotion or Disease Self-Management (General Plan of Care)     Long-Range Goal: MM Disease Management   Start Date: 08/21/2020  Expected End Date: 10/18/2020  This Visit's Progress: On track  Priority: Medium  Note:   Current Barriers:  . Patient has difficulty swallowing . Patient struggles with food o Has stamps but they run out by end of month . Smoking cessation  Pharmacist Clinical Goal(s):  Marland Kitchen Over the next 90 days, patient will  Contact Smoking Cessation hotline through [place] from phone number  through collaboration with PharmD and provider.  .   Interventions: . Inter-disciplinary care team collaboration (see longitudinal plan of care) . Comprehensive medication review performed;  medication list updated in electronic medical record  '@[email or web address]' @ '@[email or web address]' @  Patient Goals/Self-Care Activities . Over the next 90 days, patient will:  -  Work on Smoking Cessation  Follow Up Plan: The care management team will reach out to the patient again over the next 90 days.      Task: Mutually Develop and Royce Macadamia Achievement of Patient Goals   Note:   Care Management Activities:    - verbalization of feelings encouraged    Notes:

## 2020-11-19 NOTE — Patient Outreach (Signed)
Medicaid Managed Care    Pharmacy Note  11/19/2020 Name: [name] MRN: [id number] DOB: 08-31-1955  [name] is a 65 y.o. year old female who is a primary care patient of [name], [name], MD. The [facility] Managed Care Coordination team was consulted for assistance with disease management and care coordination needs.    Engaged with patient Engaged with patient by telephone for initial visit in response to referral for case management and/or care coordination services.  Ms. Pagett was given information about Managed Medicaid Care Coordination team services today. [place] agreed to services and verbal consent obtained.   Objective:  Lab Results  Component Value Date   CREATININE 1.02 (H) 04/10/2020   CREATININE 1.02 (H) 04/05/2019   CREATININE 1.09 (H) 06/01/2018    Lab Results  Component Value Date   HGBA1C 5.5 05/16/2020       Component Value Date/Time   CHOL 148 06/19/2014 1656   TRIG 176 (H) 06/19/2014 1656   HDL 52 06/19/2014 1656   CHOLHDL 2.8 06/19/2014 1656   VLDL 35 06/19/2014 1656   LDLCALC 61 06/19/2014 1656   LDLDIRECT 77 09/14/2012 1618    Other: (TSH, CBC, Vit D, etc.)  Clinical ASCVD: No  The ASCVD Risk score Mikey Bussing DC Jr., et al., 2013) failed to calculate for the following reasons:   Cannot find a previous HDL lab   Cannot find a previous total cholesterol lab    Other: (CHADS2VASc if Afib, PHQ9 if depression, MMRC or CAT for COPD, ACT, DEXA)  BP Readings from Last 3 Encounters:  11/12/20 118/80  05/30/20 100/60  05/16/20 102/70    Assessment/Interventions: Review of patient past medical history, allergies, medications, health status, including review of consultants reports, laboratory and other test data, was performed as part of comprehensive evaluation and provision of chronic care management services.   Covid Vaccination Has allergies and hasn't gotten Covid vaccine due to fears Jan 2022 Plan: Spoke a lot about , patient will  try to get shot at Hospital in case a reaction occurs April 2022: Re-emphasized importance of getting vaccinated, patient still waiting  Smoking 1PPD -Likes to smoke because wants time to herself Patch gum didn't work -Can't take pill due to seizures Jan 2022 Plan: Will call [place] Hotline and work on cessation April 2022: Patient still hesitant to quit, re-gave her the number. States her biggest fear is getting cancer or getting Covid or death from the flu. Offered support but never was forceful about it  Migraines Aimovig Topiramate 100mg  1AM and 2HS Plan: At goal,  patient stable/ symptoms controlled   Seizures Lamotrigine 200mg  take 1.5tabs BID Plan: At goal,  patient stable/ symptoms controlled    Mood: Depression screen [facility] 2/9 11/12/2020 05/30/2020 05/16/2020  Decreased Interest 3 2 1   Down, Depressed, Hopeless 0 0 2  PHQ - 2 Score 3 2 3   Altered sleeping 2 3 2   Tired, decreased energy 3 1 1   Change in appetite 3 2 2   Feeling bad or failure about yourself  0 0 0  Trouble concentrating 0 0 0  Moving slowly or fidgety/restless 1 1 2   Suicidal thoughts 0 0 0  PHQ-9 Score 12 9 10   Difficult doing work/chores - Extremely dIfficult -  Some recent data might be hidden   Sertraline 50mg  Plan: At goal,  patient stable/ symptoms controlled    Insomnia Quetiapine 100mg  HS -Helps her go to sleep -Wakes up 2-3x/week 10PM to 3AM is her sleep schedule -  Has sleep apnea  Plan: Once she gets the covid vaccine she can get another sleep study done, this should help stress and all disease states  Cholesterol Lab Results  Component Value Date   CHOL 148 06/19/2014   CHOL 202 (H) 02/13/2010   CHOL 203 (H) 01/26/2008   Lab Results  Component Value Date   HDL 52 06/19/2014   HDL 55 02/13/2010   HDL 47 01/26/2008   Lab Results  Component Value Date   LDLCALC 61 06/19/2014   LDLCALC 126 (H) 02/13/2010   LDLCALC 113 (H) 01/26/2008   Lab Results  Component Value Date    TRIG 176 (H) 06/19/2014   TRIG 104 02/13/2010   TRIG 215 (H) 01/26/2008   Lab Results  Component Value Date   CHOLHDL 2.8 06/19/2014   CHOLHDL 3.7 Ratio 02/13/2010   CHOLHDL 4.3 Ratio 01/26/2008   Lab Results  Component Value Date   LDLDIRECT 77 09/14/2012   LDLDIRECT 103 (H) 01/31/2011   LDLDIRECT 56 03/29/2008   Simvastatin 40mg  Plan: Focus was smoking cessation today, will inquire about changing statin at future appt  Pain 08/21/20: Without meds:10      With Meloxicam: 8 Meloxicam (Doesn't help much but does a little) Plan: At goal,  patient stable/ symptoms controlled      SDOH (Social Determinants of Health) assessments and interventions performed:    Care Plan  Allergies  Allergen Reactions  . Amoxicillin Anaphylaxis    Breakout, mouth swells  . Azithromycin Swelling and Rash    "swelling all over, high, thrush"  . Cefdinir     Throat swelling after 3rd dose  . Chantix [Varenicline] Other (See Comments)    "bad dreams, difficulty breathing"  . Doxycycline Hyclate Anaphylaxis and Rash    swelling  . Levofloxacin Anaphylaxis  . Penicillins Anaphylaxis, Shortness Of Breath and Rash    "ALMOST DIED, ended up in hospital"  PCN reaction causing immediate rash, facial/tongue/throat swelling, SOB or lightheadedness with hypotension: YES PCN reaction causing severe rash involving mucus membranes or skin necrosis: NO PCN reaction that required hospitalization YES Has patient had a PCN reaction occurring within the last 10 years: NO   . Sulfonamide Derivatives Shortness Of Breath, Swelling and Rash    "break out from head to toe"  . Toradol [Ketorolac Tromethamine] Other (See Comments)    Seizure   . Clarithromycin Hives    swelling  . Nortriptyline Swelling, Rash and Other (See Comments)    Per patient had "kidney problems" on this medication, could not use bathroom (urinary retention)  . Codeine Nausea And Vomiting  . Fluticasone-Salmeterol Other (See  Comments)    REACTION: ulcers in mouth  . Triptans Rash    Mouth breaks out and start itching    Medications Reviewed Today    Reviewed by [name], RN (Registered Nurse) on 11/13/20 at 1545  Med List Status: <None>  Medication Order Taking? Sig Documenting Provider Last Dose Status Informant  acetaminophen (TYLENOL) 500 MG tablet [id number] Yes Take 1,000 mg by mouth every other day. [provider] Taking Active Self  albuterol (PROAIR HFA) 108 (90 Base) MCG/ACT inhaler [id number] Yes INHALE TWO PUFFS INTO THE LUNGS EVERY 6 HOURS AS NEEDED FOR SHORTNESS OF BREATH [name], [name], MD Taking Active   albuterol (PROVENTIL) (2.5 MG/3ML) 0.083% nebulizer solution [id number] Yes USE 1 VIAL IN NEBULIZER EVERY 6 HOURS AS NEEDED FOR SHORTNESS OF BREATH [name], [name], MD Taking Active   aspirin EC 81 MG tablet  [id number] Yes Take 81 mg by mouth daily. Swallow whole. [provider] Taking Active   calcium citrate (CALCITRATE) 950 (200 Ca) MG tablet [id number] Yes Take 1 tablet (200 mg of elemental calcium total) by mouth 2 (two) times daily. [name], [name], MD Taking Active   Cholecalciferol (VITAMIN D3) 50 MCG (2000 UT) capsule [id number] Yes TAKE ONE CAPSULE BY MOUTH DAILY [name], [name], MD Taking Active   clindamycin (CLEOCIN) 300 MG capsule [id number]  Take 1 capsule (300 mg total) by mouth 3 (three) times daily. [name], [name], MD  Active            Med Note (ROBB, MELANIE A   Tue Nov 13, 2020  3:43 PM) Waiting for medication to be delivered  [facility] (AIMOVIG) 70 MG/ML SOAJ [id number] Yes INJECT 70MG  INTO THE SKIN EVERY 14 DAYS [name], NP Taking Active   ferrous sulfate 324 (65 Fe) MG TBEC [id number] Yes Take 1 tablet (324 mg total) by mouth every other day. [name], [name], MD Taking Active   fluticasone (FLONASE) 50 MCG/ACT nasal spray [id number] Yes SPRAY 2 SPRAYS INTO EACH NOSTRIL DAILY AS NEEDED FOR  CONGESTION. [name], [name], MD Taking Active   gabapentin (NEURONTIN) 100 MG capsule [id number]  Take 1 capsule (100 mg total) by mouth at bedtime. [name], [name], MD  Active            Med Note (ROBB, MELANIE A   Tue Nov 13, 2020  3:42 PM) Waiting for medication to be delivered  guaiFENesin (MUCINEX) 600 MG 12 hr tablet [id number] Yes Take 2 tablets (1,200 mg total) by mouth 2 (two) times daily as needed for cough. [name], [name], MD Taking Active   lamoTRIgine (LAMICTAL) 200 MG tablet [id number] Yes Take 1.5 tablets (300 mg total) by mouth 2 (two) times daily. [name], NP Taking Active   loratadine (CLARITIN) 10 MG tablet [id number] Yes Take 1 tablet (10 mg total) by mouth daily. [name], [name], MD Taking Active   meloxicam (MOBIC) 15 MG tablet [id number] Yes TAKE ONE TABLET BY MOUTH EVERY DAY [name], [name], MD Taking Active   mometasone-formoterol [facility]) 200-5 MCG/ACT AERO [id number] Yes INHALE TWO PUFFS INTO THE LUNGS TWICE DAILY [name], [name], MD Taking Active   NON FORMULARY [id number] No Use CPAP at bedtime  Patient not taking: No sig reported   [provider] Not Taking Active   omeprazole (PRILOSEC) 40 MG capsule [id number] Yes TAKE ONE CAPSULE BY MOUTH TWICE DAILY [name], [name], MD Taking Active   polyethylene glycol powder (GLYCOLAX/MIRALAX) powder [id number] Yes MIX 17 GMS IN LIQUID AND DRINK ONCE DAILY Ola Spurr, Sharman Cheek, MD Taking Active   predniSONE (DELTASONE) 20 MG tablet [id number]  Take 2 tablets (40 mg total) by mouth daily with breakfast. [name], [name], MD  Active            Med Note (ROBB, MELANIE A   Tue Nov 13, 2020  3:41 PM) Waiting for medication delivered  QUEtiapine (SEROQUEL) 100 MG tablet [id number] Yes Take 1 tablet (100 mg total) by mouth at bedtime. [name], NP Taking Active   sertraline (ZOLOFT) 50 MG tablet [id number] Yes Take 1 tablet (50 mg total) by  mouth daily. [name], [name], MD Taking Active   simvastatin (ZOCOR) 40 MG tablet [id number] Yes Take 1 tablet (40 mg total) by mouth at bedtime. [name], [name], MD Taking Active   topiramate (TOPAMAX) 100 MG tablet [id number] Yes TAKE ONE TABLET BY MOUTH EVERY MORNING AND 2 TABLETS AT BEDTIME Olegario Messier,  Charlynne Cousins, NP Taking Active           Patient Active Problem List   Diagnosis Date Noted  . Radiculopathy affecting upper extremity 11/12/2020  . Encounter for wheelchair assessment 05/16/2020  . Healthcare maintenance 05/16/2020  . Shortness of breath 07/26/2019  . Tobacco use disorder 07/02/2018  . Elevated serum creatinine 06/01/2018  . Weakness 05/15/2017  . Chronic bronchitis ([place]) 01/14/2017  . Claudication ([place]) 04/09/2016  . Seizure disorder ([place]) 11/12/2015  . 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 Chronic obstructive pulmonary disease ([place]) 04/25/2015  . Dizziness 10/24/2014  . Pulmonary hypertension ([place]) 08/06/2013  . Diastolic CHF ([place]) 63/78/5885  . BPPV (benign paroxysmal positional vertigo) 03/09/2013  . Chronic respiratory failure ([place]) 11/01/2012  . Left shoulder pain 06/27/2012  . ANXIETY 02/13/2010  . Obstructive sleep apnea 02/19/2009  . Vitamin D deficiency 01/21/2008  . OSTEOPENIA 11/24/2007  . Irritable bowel syndrome 11/17/2007  . Allergic rhinitis 09/24/2007  . Hyperlipidemia 10/08/2006  . OBESITY, NOS  BMI 33.8 10/08/2006  . GLAUCOMA 10/08/2006  . GASTROESOPHAGEAL REFLUX, NO ESOPHAGITIS 10/08/2006  . HERNIA, HIATAL, NONCONGENITAL 10/08/2006  . INCONTINENCE, STRESS, FEMALE 10/08/2006  . DJD (degenerative joint disease) of knee 10/08/2006    Conditions to be addressed/monitored: COPD and Smoking Cessation  Patient Care Plan: COPD (Adult)    Problem Identified: Disease Progression (COPD)     Long-Range Goal: Disease Progression Minimized or Managed   Start Date: 08/17/2020  Expected End  Date: 10/17/2020  This Visit's Progress: On track  Priority: Medium  Note:   Current Barriers:  . Chronic Disease Management support and education needs related to COPD-Ms. Kampa reports difficulty swallowing solids and liquids, missed her appointment for evaluation due to lack of transportation. She is not sleeping consistently at night-lost her CPAP during a recent move. She does not want to go into the hospital for a sleep study due to fear of Covid. She has not been vaccinated due to her many allergies. She is working on smoking cessation-no cigarettes since yesterday. . Film/video editor.  . Transportation barriers . Food Insecurities-Has food stamps, but tends to run out of food before the end of the month . Non-adherence to scheduled provider appointments-due to transportation barriers  Nurse Case Manager Clinical Goal(s):  Marland Kitchen Over the next 30 days, patient will verbalize understanding of plan for swallowing evaluation. Encouraged patient to call and reschedule appointment today. . Over the next 30 days, patient will meet with RN Care Manager to address plan for sleep study to acquire new CPAP . Over the next 30 days, patient will attend all scheduled medical appointments: 08/29/20 with Pulmonology . Over the next 30 days, patient will work with CM team pharmacist to review medications . Over the next 30 days, patient will work with [place] to address community resources/food insecurities  Interventions:  . Inter-disciplinary care team collaboration (see longitudinal plan of care) . Advised patient to reschedule appointment for swallowing evaluation, discuss need for new CPAP at her appointment with Pulmonology, continue to work on cutting back on the number of cigarettes/day . Reviewed medications with patient and discussed her needing refill for iron and her difficulty swallowing pills . Reviewed scheduled/upcoming provider appointments including: 08/29/20 with Pulmonology and  rescheduling swallow evaluation . Care Guide referral for food insecurities . Pharmacy referral for medication review/assistance . Provided patient with the [facility] community transportation number [phone]  Patient Goals/Self-Care Activities Over the next 30 days, patient will:  -Self  administers medications as prescribed - Attends all scheduled provider appointments - Calls pharmacy for medication refills - Calls provider office for new concerns or questions - develop a new routine to improve sleep - eat healthy - get at least 7 to 8 hours of sleep at night - keep room cool and dark - practice relaxation or meditation daily - begin a notebook of services in my neighborhood or community - call 211 when I need some help - follow-up on any referrals for help I am given - make a list of family or friends that I can call   Follow Up Plan: Telephone follow up appointment with Managed Medicaid care management team member scheduled for:09/19/20 @ 11:15am       Patient Care Plan: MM Disease Management    Problem Identified: Health Promotion or Disease Self-Management (General Plan of Care)     Long-Range Goal: MM Disease Management   Start Date: 08/21/2020  Expected End Date: 10/18/2020  This Visit's Progress: On track  Priority: Medium  Note:   Current Barriers:  . Patient has difficulty swallowing . Patient struggles with food o Has stamps but they run out by end of month . Smoking cessation  Pharmacist Clinical Goal(s):  Marland Kitchen Over the next 90 days, patient will  Contact Smoking Cessation hotline through [place] from phone number  through collaboration with PharmD and provider.  .   Interventions: . Inter-disciplinary care team collaboration (see longitudinal plan of care) . Comprehensive medication review performed; medication list updated in electronic medical record  @RXCPCOPD @ @RXCPMENTALHEALTH @  Patient Goals/Self-Care Activities . Over the next 90 days, patient will:  -   Work on Smoking Cessation  Follow Up Plan: The care management team will reach out to the patient again over the next 90 days.      Task: Mutually Develop and Royce Macadamia Achievement of Patient Goals   Note:   Care Management Activities:        Notes:      Medication Assistance: None required. Patient affirms current coverage meets needs.   Follow up: Agree  Plan: The care management team will reach out to the patient again over the next 90 days.   [place] Con[place], Pharm.D., Managed Medicaid Pharmacist - [phone]

## 2020-11-21 ENCOUNTER — Other Ambulatory Visit: Payer: Self-pay | Admitting: Family Medicine

## 2020-12-09 ENCOUNTER — Other Ambulatory Visit: Payer: Self-pay | Admitting: Family Medicine

## 2020-12-11 ENCOUNTER — Other Ambulatory Visit: Payer: Self-pay | Admitting: *Deleted

## 2020-12-11 MED ORDER — CALCIUM CITRATE 950 (200 CA) MG PO TABS: 1.0000 | ORAL_TABLET | Freq: Two times a day (BID) | ORAL | 3 refills | Status: DC

## 2020-12-14 ENCOUNTER — Other Ambulatory Visit: Payer: Self-pay | Admitting: Family Medicine

## 2020-12-14 DIAGNOSIS — M545 Low back pain, unspecified: Secondary | ICD-10-CM

## 2020-12-17 NOTE — Progress Notes (Signed)
    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 and states that the spot above her left elbow is sore  She denies any new growths or changes to the area where the cancer was resected  Reports having rotator cuff surgery 7 years ago in the affected shoulder  Pain has been presents for several months and does not respond to tylenol     Dysphagia  Reports trouble swallowing and food gets caught behind her vocal cords  She reports that food has been getting caught in her chest for the last month  She reports no troule with drinking liquids  She states that taking one pill at a time is managable with plent of water afterwards  She reports that she had a swallow study a few months ago and has not seen any specialist since due to forgetting when her appt was (11/2019) with no treatment recommended at the time and patient to slow rate of swallowing and given techniques such as breath hold and tilting her chin to help with swallowing    PERTINENT  PMH / PSH:  COPD    OBJECTIVE:   BP 107/62   Pulse (!) 108   Wt 180 lb 3.2 oz (81.7 kg)   SpO2 (!) 5%   BMI 34.05 kg/m   General: female appearing stated age in no acute distress Cardio: Normal S1 and S2, no S3 or S4. Rhythm is regular. No murmurs or rubs.  Bilateral radial pulses palpable Pulm: Clear to auscultation bilaterally, no crackles, wheezing, or diminished breath sounds. Normal respiratory effort, stable on RA Abdomen: Bowel sounds normal. Abdomen soft and non-tender.  Extremities: No  peripheral edema. Warm/ well perfused.  Neuro: pt alert and oriented x4   Shoulder: Inspection reveals no obvious deformity, atrophy, or asymmetry. No bruising. No swelling Palpation is notable for TTP over [facility] joint, no TTP involving bicipital groove. Full ROM in flexion,  Limited to 90 degrees of abduction, does not complete  internal/external rotation of left shoulder NV intact distally  ASSESSMENT/PLAN:   Left shoulder pain Likely due to arthritis changes over the years with hx of rotator cuff pathology and s/p surgery. Problem has been present for several years and has been progressively becoming more painful - humerus and left shoulder xrays -discussed PT and referral to orthopedics, patient adamant that she does not want to have steroid injections but states that she is open to seeing orthopedics  - she requests something to help with pain  - discussed extensive allergies, patient requests to start short supply of oxycodone as she has taken this before  - limited supply prescribed  Dysphagia Prior swallow study ordered showing some disorganized swallowing in the oropharyngeal stage from several years ago. Patient agreeable to GI evaluation.  - amb referral to GI for dysphagia      Eulis Foster, MD [place]

## 2020-12-18 ENCOUNTER — Other Ambulatory Visit: Payer: Self-pay

## 2020-12-18 ENCOUNTER — Encounter: Payer: Self-pay | Admitting: Family Medicine

## 2020-12-18 ENCOUNTER — Ambulatory Visit (INDEPENDENT_AMBULATORY_CARE_PROVIDER_SITE_OTHER): Payer: Medicaid Other | Admitting: Family Medicine

## 2020-12-18 VITALS — BP 107/62 | HR 108 | Wt 180.2 lb

## 2020-12-18 DIAGNOSIS — M25512 Pain in left shoulder: Secondary | ICD-10-CM | POA: Diagnosis present

## 2020-12-18 DIAGNOSIS — G8929 Other chronic pain: Secondary | ICD-10-CM | POA: Diagnosis not present

## 2020-12-18 DIAGNOSIS — R1319 Other dysphagia: Secondary | ICD-10-CM

## 2020-12-18 MED ORDER — OXYCODONE HCL 5 MG PO CAPS: 5.0000 mg | ORAL_CAPSULE | ORAL | 0 refills | Status: DC | PRN

## 2020-12-18 MED ORDER — ALBUTEROL SULFATE (2.5 MG/3ML) 0.083% IN NEBU: INHALATION_SOLUTION | RESPIRATORY_TRACT | 6 refills | Status: DC

## 2020-12-18 MED ORDER — ALBUTEROL SULFATE HFA 108 (90 BASE) MCG/ACT IN AERS: INHALATION_SPRAY | RESPIRATORY_TRACT | 6 refills | Status: DC

## 2020-12-18 NOTE — Patient Instructions (Addendum)
I have ordered x-rays of your left shoulder and left arm. Please go to [address] for imaging when you are available.  I have also submitted a referral to gastroenterology to evaluate your swallowing difficulties.  Please be on the look out for a call within the next 2 weeks for them to schedule this appointment.  Please plan to follow-up with me  to help with the next 2-4 weeks to discuss possible referral to orthopedics for your left shoulder pain.  Also consider starting physical therapy.

## 2020-12-20 ENCOUNTER — Other Ambulatory Visit: Payer: Self-pay | Admitting: Family Medicine

## 2020-12-20 ENCOUNTER — Telehealth: Payer: Self-pay

## 2020-12-20 DIAGNOSIS — R1310 Dysphagia, unspecified: Secondary | ICD-10-CM | POA: Insufficient documentation

## 2020-12-20 MED ORDER — OXYCODONE-ACETAMINOPHEN 7.5-325 MG PO TABS: 1.0000 | ORAL_TABLET | Freq: Four times a day (QID) | ORAL | 0 refills | Status: AC | PRN

## 2020-12-20 NOTE — Assessment & Plan Note (Addendum)
Likely due to arthritis changes over the years with hx of rotator cuff pathology and s/p surgery. Problem has been present for several years and has been progressively becoming more painful - humerus and left shoulder xrays -discussed PT and referral to orthopedics, patient adamant that she does not want to have steroid injections but states that she is open to seeing orthopedics  - she requests something to help with pain  - discussed extensive allergies, patient requests to start short supply of oxycodone as she has taken this before  - limited supply prescribed

## 2020-12-20 NOTE — Assessment & Plan Note (Signed)
Prior swallow study ordered showing some disorganized swallowing in the oropharyngeal stage from several years ago. Patient agreeable to GI evaluation.  - amb referral to GI for dysphagia

## 2020-12-20 NOTE — Telephone Encounter (Signed)
Updated prescription sent to pharmacy for tablets

## 2020-12-20 NOTE — Telephone Encounter (Signed)
Received phone call from pharmacist regarding oxycodone rx. Pharmacy does not carry oxycodone capsules and will need new rx sent over for oxycodone tablets.   Forwarding to PCP  Talbot Grumbling, RN

## 2020-12-21 ENCOUNTER — Other Ambulatory Visit: Payer: Self-pay | Admitting: *Deleted

## 2020-12-21 MED ORDER — SERTRALINE HCL 50 MG PO TABS: 50.0000 mg | ORAL_TABLET | Freq: Every day | ORAL | 2 refills | Status: DC

## 2020-12-24 ENCOUNTER — Other Ambulatory Visit: Payer: Self-pay

## 2020-12-24 ENCOUNTER — Other Ambulatory Visit: Payer: Self-pay | Admitting: *Deleted

## 2020-12-24 ENCOUNTER — Other Ambulatory Visit: Payer: Self-pay | Admitting: Family Medicine

## 2020-12-24 NOTE — Patient Instructions (Signed)
Visit Information  Ms. Dipasquale was given information about Medicaid Managed Care team care coordination services as a part of their [place] Medicaid benefit. [place] verbally consented to engagement with the [facility] Managed Care team.   For questions related to your [facility], please call: [phone] or visit the homepage here: [email or web address]  If you would like to schedule transportation through your [facility], please call the following number at least 2 days in advance of your appointment: [phone].   Call the [place] at [phone], at any time, 24 hours a day, 7 days a week. If you are in danger or need immediate medical attention call 911.  Ms. Flannigan - following are the goals we discussed in your visit today:  Goals Addressed            This Visit's Progress   . Find Help in My Community       Timeframe:  Long-Range Goal Priority:  Medium Start Date:   08/17/20                          Expected End Date:  01/24/21                      Follow up 01/24/21   - call [phone] for transportation to medical appointments. - begin a notebook of services in my neighborhood or community - call 211 when I need some help - follow-up on any referrals for help I am given, call to schedule an appointment for ENT [phone]  Pulmonary [phone] and GI [phone] - make a list of family or friends that I can call    Why is this important?    Knowing how and where to find help for yourself or family in your neighborhood and community is an important skill.   You will want to take some steps to learn how.         . Manage Fatigue (Tiredness-COPD)       Follow up 01/24/21   - develop a new routine to improve sleep - eat healthy - get at least 7 to 8 hours of sleep at night - keep room cool and dark - practice  relaxation or meditation daily    Why is this important?    Feeling tired or worn out is a common symptom of COPD (chronic obstructive pulmonary disease).   Learning when you feel your best and when you need rest is important.   Managing the tiredness (fatigue) will help you be active and enjoy life.            Please see education materials related to sleep apnea  provided by MyChart link.    Sleep Apnea Sleep apnea affects breathing during sleep. It causes breathing to stop for a short time or to become shallow. It can also increase the risk of:  Heart attack.  Stroke.  Being very overweight (obese).  Diabetes.  Heart failure.  Irregular heartbeat. The goal of treatment is to help you breathe normally again. What are the causes? There are three kinds of sleep apnea:  Obstructive sleep apnea. This is caused by a blocked or collapsed airway.  Central sleep apnea. This happens when the brain does not send the right signals to the muscles that control breathing.  Mixed sleep apnea. This is a combination of obstructive and central sleep apnea. The  most common cause of this condition is a collapsed or blocked airway. This can happen if:  Your throat muscles are too relaxed.  Your tongue and tonsils are too large.  You are overweight.  Your airway is too small.   What increases the risk?  Being overweight.  Smoking.  Having a small airway.  Being older.  Being female.  Drinking alcohol.  Taking medicines to calm yourself (sedatives or tranquilizers).  Having family members with the condition. What are the signs or symptoms?  Trouble staying asleep.  Being sleepy or tired during the day.  Getting angry a lot.  Loud snoring.  Headaches in the morning.  Not being able to focus your mind (concentrate).  Forgetting things.  Less interest in sex.  Mood swings.  Personality changes.  Feelings of sadness (depression).  Waking up a lot during  the night to pee (urinate).  Dry mouth.  Sore throat. How is this diagnosed?  Your medical history.  A physical exam.  A test that is done when you are sleeping (sleep study). The test is most often done in a sleep lab but may also be done at home. How is this treated?  Sleeping on your side.  Using a medicine to get rid of mucus in your nose (decongestant).  Avoiding the use of alcohol, medicines to help you relax, or certain pain medicines (narcotics).  Losing weight, if needed.  Changing your diet.  Not smoking.  Using a machine to open your airway while you sleep, such as: ? An oral appliance. This is a mouthpiece that shifts your lower jaw forward. ? A CPAP device. This device blows air through a mask when you breathe out (exhale). ? An EPAP device. This has valves that you put in each nostril. ? A BPAP device. This device blows air through a mask when you breathe in (inhale) and breathe out.  Having surgery if other treatments do not work. It is important to get treatment for sleep apnea. Without treatment, it can lead to:  High blood pressure.  Coronary artery disease.  In men, not being able to have an erection (impotence).  Reduced thinking ability.   Follow these instructions at home: Lifestyle  Make changes that your doctor recommends.  Eat a healthy diet.  Lose weight if needed.  Avoid alcohol, medicines to help you relax, and some pain medicines.  Do not use any products that contain nicotine or tobacco, such as cigarettes, e-cigarettes, and chewing tobacco. If you need help quitting, ask your doctor. General instructions  Take over-the-counter and prescription medicines only as told by your doctor.  If you were given a machine to use while you sleep, use it only as told by your doctor.  If you are having surgery, make sure to tell your doctor you have sleep apnea. You may need to bring your device with you.  Keep all follow-up visits as told  by your doctor. This is important. Contact a doctor if:  The machine that you were given to use during sleep bothers you or does not seem to be working.  You do not get better.  You get worse. Get help right away if:  Your chest hurts.  You have trouble breathing in enough air.  You have an uncomfortable feeling in your back, arms, or stomach.  You have trouble talking.  One side of your body feels weak.  A part of your face is hanging down. These symptoms may be an emergency. Do  not wait to see if the symptoms will go away. Get medical help right away. Call your local emergency services (911 in the [place] Do not drive yourself to the hospital. Summary  This condition affects breathing during sleep.  The most common cause is a collapsed or blocked airway.  The goal of treatment is to help you breathe normally while you sleep. This information is not intended to replace advice given to you by your health care provider. Make sure you discuss any questions you have with your health care provider. Document Revised: 05/14/2018 Document Reviewed: 03/23/2018 Elsevier Patient Education  2021 [place].   Patient verbalizes understanding of instructions provided today.   Telephone follow up appointment with Managed Medicaid care management team member scheduled for:01/24/21 @ 3:30pm  Lurena Joiner RN, BSN [place] RN Care Coordinator   Following is a copy of your plan of care:  Patient Care Plan: COPD (Adult)    Problem Identified: Disease Progression (COPD)     Long-Range Goal: Disease Progression Minimized or Managed   Start Date: 08/17/2020  Expected End Date: 01/24/2021  Recent Progress: On track  Priority: Medium  Note:   Current Barriers:  . Chronic Disease Management support and education needs related to COPD-Ms. Heber reports difficulty swallowing solids and liquids, missed her appointment for evaluation due to lack of transportation.  She is not sleeping consistently at night-lost her CPAP during a recent move. She does not want to go into the hospital for a sleep study due to fear of Covid. She has not been vaccinated due to her many allergies. She is working on smoking cessation-down to 1/2 pack/day. Update- Ms. Wagar continues to work on smoking cessation, down to 6 cig/day.She was recently treated for bronchitis and is feeling better(reports she has bronchitis frequently).  A referral for Pulmonology in [place] was placed by PCP-patient to call and schedule new appointment. Patient rescheduled her neurology appointment to June. A new GI referral has been placed-patient to call and schedule appointment. She would like for all appointments to be after June 3 due to school will be out and she will not have to be responsible for child care for her grandchildren. . Film/video editor.  . Transportation barriers . Food Insecurities-Has food stamps, but tends to run out of food before the end of the month . Non-adherence to scheduled provider appointments-due to transportation barriers Nurse Case Manager Clinical Goal(s):  . patient will verbalize understanding of plan for swallowing evaluation. Encouraged patient to call and reschedule appointment today.-Update-Patient plans to call when she feels better . patient will meet with RN Care Manager to address plan for sleep study to acquire new CPAP-Patient would like an in home sleep study and plans to inquire at her next neurology appointment . patient will attend all scheduled medical appointments . patient will work with CM team pharmacist to review medications-Met . patient will work with Care Guide to address community resources/food insecurities-Met Interventions:  . Inter-disciplinary care team collaboration (see longitudinal plan of care) . Encouraged patient to call today and schedule appointment with Pulmonology([phone]) and [phone]) . Advised patient to  reschedule appointment for swallowing evaluation, discuss need for new CPAP at her appointment with Pulmonology, continue to work on cutting back on the number of cigarettes/day . Reviewed medications, discussed new medication, gabapentin and oral care after using inhaler and nebulizer . Reviewed scheduled/upcoming provider appointments including:  Neurology 01/17/21 . Encouraged patient to notifiy PCP if symptoms worsen or do  not improve after taking prescribed medication . Provided education on COPD Patient Goals/Self-Care Activities Over the next 30 days, patient will: - call [phone] for transportation to medical appointments. - begin a notebook of services in my neighborhood or community - call 211 when I need some help - follow-up on any referrals for help I am given, call to schedule an appointment for ENT [phone],  Pulmonary [phone] and GI [phone] - make a list of family or friends that I can call  - develop a new routine to improve sleep - eat healthy - get at least 7 to 8 hours of sleep at night - keep room cool and dark - practice relaxation or meditation daily   Follow Up Plan: Telephone follow up appointment with Managed Medicaid care management team member scheduled for:01/24/21 @ 3:30pm       Patient Care Plan: MM Disease Management    Problem Identified: Health Promotion or Disease Self-Management (General Plan of Care)     Long-Range Goal: MM Disease Management   Start Date: 08/21/2020  Expected End Date: 10/18/2020  This Visit's Progress: On track  Priority: Medium  Note:   Current Barriers:  . Patient has difficulty swallowing . Patient struggles with food o Has stamps but they run out by end of month . Smoking cessation  Pharmacist Clinical Goal(s):  Marland Kitchen Over the next 90 days, patient will  Contact Smoking Cessation hotline through [place] from phone number  through collaboration with PharmD and provider.   .   Interventions: . Inter-disciplinary care team collaboration (see longitudinal plan of care) . Comprehensive medication review performed; medication list updated in electronic medical record  '@[email or web address]' @ '@[email or web address]' @  Patient Goals/Self-Care Activities . Over the next 90 days, patient will:  -  Work on Smoking Cessation  Follow Up Plan: The care management team will reach out to the patient again over the next 90 days.

## 2020-12-24 NOTE — Patient Outreach (Signed)
Medicaid Managed Care   Nurse Care Manager Note  12/24/2020 Name:  [name] MRN:  [id number] DOB:  1956/02/28  [name] is an 65 y.o. year old female who is a primary patient of [name], [name], MD.  The [place] team was consulted for assistance with:    CHF COPD OSA  Ms. Rocks was given information about [employer] team services today. [place] agreed to services and verbal consent obtained.  Engaged with patient by telephone for follow up visit in response to provider referral for case management and/or care coordination services.   Assessments/Interventions:  Review of past medical history, allergies, medications, health status, including review of consultants reports, laboratory and other test data, was performed as part of comprehensive evaluation and provision of chronic care management services.  SDOH (Social Determinants of Health) assessments and interventions performed:   Care Plan  Allergies  Allergen Reactions  . Amoxicillin Anaphylaxis    Breakout, mouth swells  . Azithromycin Swelling and Rash    "swelling all over, high, thrush"  . Cefdinir     Throat swelling after 3rd dose  . Chantix [Varenicline] Other (See Comments)    "bad dreams, difficulty breathing"  . Doxycycline Hyclate Anaphylaxis and Rash    swelling  . Levofloxacin Anaphylaxis  . Penicillins Anaphylaxis, Shortness Of Breath and Rash    "ALMOST DIED, ended up in hospital"  PCN reaction causing immediate rash, facial/tongue/throat swelling, SOB or lightheadedness with hypotension: YES PCN reaction causing severe rash involving mucus membranes or skin necrosis: NO PCN reaction that required hospitalization YES Has patient had a PCN reaction occurring within the last 10 years: NO   . Sulfonamide Derivatives Shortness Of Breath, Swelling and Rash    "break out from head to toe"  . Toradol [Ketorolac Tromethamine] Other (See Comments)     Seizure   . Clarithromycin Hives    swelling  . Nortriptyline Swelling, Rash and Other (See Comments)    Per patient had "kidney problems" on this medication, could not use bathroom (urinary retention)  . Codeine Nausea And Vomiting  . Fluticasone-Salmeterol Other (See Comments)    REACTION: ulcers in mouth  . Triptans Rash    Mouth breaks out and start itching    Medications Reviewed Today    Reviewed by [name], RN (Registered Nurse) on 12/24/20 at [place] List Status: <None>  Medication Order Taking? Sig Documenting Provider Last Dose Status Informant  acetaminophen (TYLENOL) 500 MG tablet [id number] Yes Take 1,000 mg by mouth every other day. [provider] Taking Active Self  albuterol (PROAIR HFA) 108 (90 Base) MCG/ACT inhaler [id number] Yes INHALE TWO PUFFS INTO THE LUNGS EVERY 6 HOURS AS NEEDED FOR SHORTNESS OF BREATH [name], [name], MD Taking Active   albuterol (PROVENTIL) (2.5 MG/3ML) 0.083% nebulizer solution [id number] Yes USE 1 VIAL IN NEBULIZER EVERY 6 HOURS AS NEEDED FOR SHORTNESS OF BREATH [name], [name], MD Taking Active   ALLERGY RELIEF 10 MG tablet [id number] Yes TAKE ONE TABLET BY MOUTH DAILY [name], [name], MD Taking Active   aspirin EC 81 MG tablet [id number] Yes Take 81 mg by mouth daily. Swallow whole. [provider] Taking Active   calcium citrate (CALCITRATE) 950 (200 Ca) MG tablet [id number] Yes Take 1 tablet (200 mg of elemental calcium total) by mouth 2 (two) times daily. [name], [name], MD Taking Active   Cholecalciferol (VITAMIN D3) 50 MCG (2000 UT) capsule [id number] Yes TAKE ONE CAPSULE BY  MOUTH DAILY [name], [name], MD Taking Active   Erenumab-aooe (AIMOVIG) 70 MG/ML Darden Palmer [id number] Yes INJECT 70MG INTO THE SKIN EVERY 14 DAYS [name], NP Taking Active   ferrous sulfate 324 (65 Fe) MG TBEC [id number] Yes Take 1 tablet (324 mg total) by mouth every other day.  [name], [name], MD Taking Active   fluticasone (FLONASE) 50 MCG/ACT nasal spray [id number] Yes SPRAY 2 SPRAYS INTO EACH NOSTRIL DAILY AS NEEDED FOR CONGESTION. [name], [name], MD Taking Active   gabapentin (NEURONTIN) 100 MG capsule [id number] Yes Take 1 capsule (100 mg total) by mouth at bedtime. [name], [name], MD Taking Active            Med Note Bonnita Levan Nov 19, 2020 11:10 AM)    guaiFENesin (MUCINEX) 600 MG 12 hr tablet [id number] Yes Take 2 tablets (1,200 mg total) by mouth 2 (two) times daily as needed for cough. [name], [name], MD Taking Active   lamoTRIgine (LAMICTAL) 200 MG tablet [id number] Yes Take 1.5 tablets (300 mg total) by mouth 2 (two) times daily. [name], NP Taking Active   meloxicam (MOBIC) 15 MG tablet [id number] Yes TAKE ONE TABLET BY MOUTH EVERY DAY [name], [name], MD Taking Active   mometasone-formoterol (DULERA) 200-5 MCG/ACT AERO [id number] Yes INHALE TWO PUFFS INTO THE LUNGS TWICE DAILY [name], [name], MD Taking Active   omeprazole (PRILOSEC) 40 MG capsule [id number] Yes TAKE ONE CAPSULE BY MOUTH TWICE DAILY [name], [name], MD Taking Active   oxyCODONE-acetaminophen (PERCOCET) 7.5-325 MG tablet [id number] Yes Take 1 tablet by mouth every 6 (six) hours as needed for up to 5 days for severe pain. [name], [name], MD Taking Active   polyethylene glycol powder (GLYCOLAX/MIRALAX) powder [id number] No MIX 17 GMS IN LIQUID AND DRINK ONCE DAILY  Patient not taking: Reported on 12/24/2020   [name], MD Not Taking Active   QUEtiapine (SEROQUEL) 100 MG tablet [id number] Yes Take 1 tablet (100 mg total) by mouth at bedtime. [name], NP Taking Active   sertraline (ZOLOFT) 50 MG tablet [id number] Yes Take 1 tablet (50 mg total) by mouth daily. [name], [name], MD Taking Active   simvastatin (ZOCOR) 40 MG tablet [id number] Yes Take 1 tablet (40 mg total) by  mouth at bedtime. [name], [name], MD Taking Active   topiramate (TOPAMAX) 100 MG tablet [id number] Yes TAKE ONE TABLET BY MOUTH EVERY MORNING AND 2 TABLETS AT BEDTIME [name], NP Taking Active           Patient Active Problem List   Diagnosis Date Noted  . Dysphagia 12/20/2020  . Radiculopathy affecting upper extremity 11/12/2020  . Encounter for wheelchair assessment 05/16/2020  . Healthcare maintenance 05/16/2020  . Shortness of breath 07/26/2019  . Tobacco use disorder 07/02/2018  . Elevated serum creatinine 06/01/2018  . Weakness 05/15/2017  . Chronic bronchitis ([place]) 01/14/2017  . Claudication ([place]) 04/09/2016  . Seizure disorder ([place]) 11/12/2015  . 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 Chronic obstructive pulmonary disease ([place]) 04/25/2015  . Dizziness 10/24/2014  . Pulmonary hypertension ([place]) 08/06/2013  . Diastolic CHF ([place]) 89/21/1941  . BPPV (benign paroxysmal positional vertigo) 03/09/2013  . Chronic respiratory failure ([place]) 11/01/2012  . Left shoulder pain 06/27/2012  . ANXIETY 02/13/2010  . Obstructive sleep apnea 02/19/2009  . Vitamin D deficiency 01/21/2008  . OSTEOPENIA 11/24/2007  . Irritable bowel syndrome 11/17/2007  . Allergic rhinitis 09/24/2007  . Hyperlipidemia 10/08/2006  . OBESITY, NOS  BMI 33.8 10/08/2006  . GLAUCOMA 10/08/2006  . GASTROESOPHAGEAL REFLUX, NO ESOPHAGITIS 10/08/2006  . HERNIA, HIATAL, NONCONGENITAL 10/08/2006  . INCONTINENCE, STRESS, FEMALE 10/08/2006  . DJD (degenerative joint disease) of knee 10/08/2006    Conditions to be addressed/monitored per PCP order:  CHF, COPD and OSA  Care Plan : COPD (Adult)  Updates made by [name], RN since 12/24/2020 12:00 AM    Problem: Disease Progression (COPD)     Long-Range Goal: Disease Progression Minimized or Managed   Start Date: 08/17/2020  Expected End Date: 01/24/2021  Recent Progress: On track  Priority: Medium   Note:   Current Barriers:  . Chronic Disease Management support and education needs related to COPD-Ms. Ruhe reports difficulty swallowing solids and liquids, missed her appointment for evaluation due to lack of transportation. She is not sleeping consistently at night-lost her CPAP during a recent move. She does not want to go into the hospital for a sleep study due to fear of Covid. She has not been vaccinated due to her many allergies. She is working on smoking cessation-down to 1/2 pack/day. Update- Ms. Lascala continues to work on smoking cessation, down to 6 cig/day.She was recently treated for bronchitis and is feeling better(reports she has bronchitis frequently).  A referral for Pulmonology in [place] was placed by PCP-patient to call and schedule new appointment. Patient rescheduled her neurology appointment to June. A new GI referral has been placed-patient to call and schedule appointment. She would like for all appointments to be after June 3 due to school will be out and she will not have to be responsible for child care for her grandchildren. . Film/video editor.  . Transportation barriers . Food Insecurities-Has food stamps, but tends to run out of food before the end of the month . Non-adherence to scheduled provider appointments-due to transportation barriers Nurse Case Manager Clinical Goal(s):  . patient will verbalize understanding of plan for swallowing evaluation. Encouraged patient to call and reschedule appointment today.-Update-Patient plans to call when she feels better . patient will meet with RN Care Manager to address plan for sleep study to acquire new CPAP-Patient would like an in home sleep study and plans to inquire at her next neurology appointment . patient will attend all scheduled medical appointments . patient will work with CM team pharmacist to review medications-Met . patient will work with Care Guide to address community resources/food  insecurities-Met Interventions:  . Inter-disciplinary care team collaboration (see longitudinal plan of care) . Encouraged patient to call today and schedule appointment with Pulmonology([phone]) and [phone]) . Advised patient to reschedule appointment for swallowing evaluation, discuss need for new CPAP at her appointment with Pulmonology, continue to work on cutting back on the number of cigarettes/day . Reviewed medications, discussed new medication, gabapentin and oral care after using inhaler and nebulizer . Reviewed scheduled/upcoming provider appointments including:  Neurology 01/17/21 . Encouraged patient to notifiy PCP if symptoms worsen or do not improve after taking prescribed medication . Provided education on COPD Patient Goals/Self-Care Activities Over the next 30 days, patient will: - call [phone] for transportation to medical appointments. - begin a notebook of services in my neighborhood or community - call 211 when I need some help - follow-up on any referrals for help I am given, call to schedule an appointment for ENT [phone],  Pulmonary [phone] and GI [phone] - make a list of family or friends that I can call  - develop a new routine to improve sleep - eat healthy -  get at least 7 to 8 hours of sleep at night - keep room cool and dark - practice relaxation or meditation daily   Follow Up Plan: Telephone follow up appointment with Managed Medicaid care management team member scheduled for:01/24/21 @ 3:30pm         Follow Up:  Patient agrees to Care Plan and Follow-up.  Plan: The Managed Medicaid care management team will reach out to the patient again over the next 30 days.  Date/time of next scheduled RN care management/care coordination outreach:  01/24/21 @ 3:30pm  Lurena Joiner RN, [place] RN Care Coordinator

## 2020-12-27 ENCOUNTER — Encounter: Payer: Self-pay | Admitting: Physician Assistant

## 2020-12-30 DIAGNOSIS — R0600 Dyspnea, unspecified: Secondary | ICD-10-CM | POA: Diagnosis not present

## 2020-12-30 DIAGNOSIS — I272 Pulmonary hypertension, unspecified: Secondary | ICD-10-CM | POA: Diagnosis not present

## 2020-12-30 DIAGNOSIS — A419 Sepsis, unspecified organism: Secondary | ICD-10-CM | POA: Diagnosis not present

## 2020-12-30 DIAGNOSIS — R069 Unspecified abnormalities of breathing: Secondary | ICD-10-CM | POA: Diagnosis not present

## 2020-12-30 DIAGNOSIS — J9602 Acute respiratory failure with hypercapnia: Secondary | ICD-10-CM | POA: Diagnosis not present

## 2020-12-30 DIAGNOSIS — R404 Transient alteration of awareness: Secondary | ICD-10-CM | POA: Diagnosis not present

## 2020-12-30 DIAGNOSIS — R509 Fever, unspecified: Secondary | ICD-10-CM | POA: Diagnosis not present

## 2020-12-30 DIAGNOSIS — Z20822 Contact with and (suspected) exposure to covid-19: Secondary | ICD-10-CM | POA: Diagnosis not present

## 2020-12-30 DIAGNOSIS — J80 Acute respiratory distress syndrome: Secondary | ICD-10-CM | POA: Diagnosis not present

## 2020-12-30 DIAGNOSIS — R059 Cough, unspecified: Secondary | ICD-10-CM | POA: Diagnosis not present

## 2020-12-30 DIAGNOSIS — R6520 Severe sepsis without septic shock: Secondary | ICD-10-CM | POA: Diagnosis not present

## 2020-12-30 DIAGNOSIS — J690 Pneumonitis due to inhalation of food and vomit: Secondary | ICD-10-CM

## 2020-12-30 DIAGNOSIS — R0902 Hypoxemia: Secondary | ICD-10-CM | POA: Diagnosis not present

## 2020-12-30 DIAGNOSIS — J441 Chronic obstructive pulmonary disease with (acute) exacerbation: Secondary | ICD-10-CM | POA: Diagnosis not present

## 2020-12-30 DIAGNOSIS — I5033 Acute on chronic diastolic (congestive) heart failure: Secondary | ICD-10-CM | POA: Diagnosis not present

## 2020-12-30 DIAGNOSIS — I5032 Chronic diastolic (congestive) heart failure: Secondary | ICD-10-CM | POA: Diagnosis not present

## 2020-12-30 DIAGNOSIS — Z720 Tobacco use: Secondary | ICD-10-CM | POA: Diagnosis not present

## 2020-12-30 DIAGNOSIS — J9601 Acute respiratory failure with hypoxia: Secondary | ICD-10-CM | POA: Diagnosis not present

## 2020-12-31 DIAGNOSIS — I517 Cardiomegaly: Secondary | ICD-10-CM | POA: Diagnosis not present

## 2020-12-31 DIAGNOSIS — I5189 Other ill-defined heart diseases: Secondary | ICD-10-CM | POA: Diagnosis not present

## 2020-12-31 DIAGNOSIS — R0902 Hypoxemia: Secondary | ICD-10-CM | POA: Diagnosis not present

## 2020-12-31 DIAGNOSIS — I5032 Chronic diastolic (congestive) heart failure: Secondary | ICD-10-CM | POA: Diagnosis not present

## 2020-12-31 DIAGNOSIS — J9601 Acute respiratory failure with hypoxia: Secondary | ICD-10-CM | POA: Diagnosis not present

## 2020-12-31 DIAGNOSIS — N200 Calculus of kidney: Secondary | ICD-10-CM | POA: Diagnosis not present

## 2020-12-31 DIAGNOSIS — R6520 Severe sepsis without septic shock: Secondary | ICD-10-CM | POA: Diagnosis not present

## 2020-12-31 DIAGNOSIS — R0602 Shortness of breath: Secondary | ICD-10-CM | POA: Diagnosis not present

## 2020-12-31 DIAGNOSIS — Z720 Tobacco use: Secondary | ICD-10-CM | POA: Diagnosis not present

## 2020-12-31 DIAGNOSIS — I700 Atherosclerosis of aorta: Secondary | ICD-10-CM | POA: Diagnosis not present

## 2020-12-31 DIAGNOSIS — I2699 Other pulmonary embolism without acute cor pulmonale: Secondary | ICD-10-CM | POA: Diagnosis not present

## 2020-12-31 DIAGNOSIS — K5750 Diverticulosis of both small and large intestine without perforation or abscess without bleeding: Secondary | ICD-10-CM | POA: Diagnosis not present

## 2020-12-31 DIAGNOSIS — J9602 Acute respiratory failure with hypercapnia: Secondary | ICD-10-CM | POA: Diagnosis not present

## 2020-12-31 DIAGNOSIS — J441 Chronic obstructive pulmonary disease with (acute) exacerbation: Secondary | ICD-10-CM | POA: Diagnosis not present

## 2020-12-31 DIAGNOSIS — Z9981 Dependence on supplemental oxygen: Secondary | ICD-10-CM | POA: Diagnosis not present

## 2020-12-31 DIAGNOSIS — A4189 Other specified sepsis: Secondary | ICD-10-CM | POA: Diagnosis not present

## 2020-12-31 DIAGNOSIS — I272 Pulmonary hypertension, unspecified: Secondary | ICD-10-CM | POA: Diagnosis not present

## 2021-01-01 DIAGNOSIS — I5030 Unspecified diastolic (congestive) heart failure: Secondary | ICD-10-CM | POA: Diagnosis not present

## 2021-01-01 DIAGNOSIS — J441 Chronic obstructive pulmonary disease with (acute) exacerbation: Secondary | ICD-10-CM | POA: Diagnosis not present

## 2021-01-02 DIAGNOSIS — M25519 Pain in unspecified shoulder: Secondary | ICD-10-CM | POA: Diagnosis not present

## 2021-01-02 DIAGNOSIS — J9600 Acute respiratory failure, unspecified whether with hypoxia or hypercapnia: Secondary | ICD-10-CM | POA: Diagnosis not present

## 2021-01-02 DIAGNOSIS — I509 Heart failure, unspecified: Secondary | ICD-10-CM | POA: Diagnosis not present

## 2021-01-02 DIAGNOSIS — R42 Dizziness and giddiness: Secondary | ICD-10-CM | POA: Diagnosis not present

## 2021-01-02 DIAGNOSIS — J441 Chronic obstructive pulmonary disease with (acute) exacerbation: Secondary | ICD-10-CM | POA: Diagnosis not present

## 2021-01-02 DIAGNOSIS — M542 Cervicalgia: Secondary | ICD-10-CM | POA: Diagnosis not present

## 2021-01-02 DIAGNOSIS — R112 Nausea with vomiting, unspecified: Secondary | ICD-10-CM | POA: Diagnosis not present

## 2021-01-02 DIAGNOSIS — A419 Sepsis, unspecified organism: Secondary | ICD-10-CM | POA: Diagnosis not present

## 2021-01-02 DIAGNOSIS — R6520 Severe sepsis without septic shock: Secondary | ICD-10-CM | POA: Diagnosis not present

## 2021-01-03 DIAGNOSIS — I272 Pulmonary hypertension, unspecified: Secondary | ICD-10-CM | POA: Diagnosis not present

## 2021-01-03 DIAGNOSIS — J9601 Acute respiratory failure with hypoxia: Secondary | ICD-10-CM | POA: Diagnosis not present

## 2021-01-03 DIAGNOSIS — Z9981 Dependence on supplemental oxygen: Secondary | ICD-10-CM | POA: Diagnosis not present

## 2021-01-03 DIAGNOSIS — I5030 Unspecified diastolic (congestive) heart failure: Secondary | ICD-10-CM | POA: Diagnosis not present

## 2021-01-03 DIAGNOSIS — R6520 Severe sepsis without septic shock: Secondary | ICD-10-CM | POA: Diagnosis not present

## 2021-01-03 DIAGNOSIS — A419 Sepsis, unspecified organism: Secondary | ICD-10-CM | POA: Diagnosis not present

## 2021-01-03 DIAGNOSIS — J449 Chronic obstructive pulmonary disease, unspecified: Secondary | ICD-10-CM | POA: Diagnosis not present

## 2021-01-04 DIAGNOSIS — E669 Obesity, unspecified: Secondary | ICD-10-CM | POA: Diagnosis not present

## 2021-01-04 DIAGNOSIS — Z836 Family history of other diseases of the respiratory system: Secondary | ICD-10-CM | POA: Diagnosis not present

## 2021-01-04 DIAGNOSIS — I272 Pulmonary hypertension, unspecified: Secondary | ICD-10-CM | POA: Diagnosis not present

## 2021-01-04 DIAGNOSIS — R6520 Severe sepsis without septic shock: Secondary | ICD-10-CM | POA: Diagnosis not present

## 2021-01-04 DIAGNOSIS — J189 Pneumonia, unspecified organism: Secondary | ICD-10-CM | POA: Diagnosis not present

## 2021-01-04 DIAGNOSIS — R0600 Dyspnea, unspecified: Secondary | ICD-10-CM | POA: Diagnosis not present

## 2021-01-04 DIAGNOSIS — A411 Sepsis due to other specified staphylococcus: Secondary | ICD-10-CM | POA: Diagnosis not present

## 2021-01-04 DIAGNOSIS — J441 Chronic obstructive pulmonary disease with (acute) exacerbation: Secondary | ICD-10-CM | POA: Diagnosis not present

## 2021-01-04 DIAGNOSIS — Z9981 Dependence on supplemental oxygen: Secondary | ICD-10-CM | POA: Diagnosis not present

## 2021-01-04 DIAGNOSIS — Z792 Long term (current) use of antibiotics: Secondary | ICD-10-CM | POA: Diagnosis not present

## 2021-01-04 DIAGNOSIS — J9600 Acute respiratory failure, unspecified whether with hypoxia or hypercapnia: Secondary | ICD-10-CM | POA: Diagnosis not present

## 2021-01-04 DIAGNOSIS — I5032 Chronic diastolic (congestive) heart failure: Secondary | ICD-10-CM | POA: Diagnosis not present

## 2021-01-04 DIAGNOSIS — Z7952 Long term (current) use of systemic steroids: Secondary | ICD-10-CM | POA: Diagnosis not present

## 2021-01-05 DIAGNOSIS — J9611 Chronic respiratory failure with hypoxia: Secondary | ICD-10-CM | POA: Diagnosis not present

## 2021-01-05 DIAGNOSIS — R6520 Severe sepsis without septic shock: Secondary | ICD-10-CM | POA: Diagnosis not present

## 2021-01-05 DIAGNOSIS — I5032 Chronic diastolic (congestive) heart failure: Secondary | ICD-10-CM | POA: Diagnosis not present

## 2021-01-05 DIAGNOSIS — I272 Pulmonary hypertension, unspecified: Secondary | ICD-10-CM | POA: Diagnosis not present

## 2021-01-05 DIAGNOSIS — Z836 Family history of other diseases of the respiratory system: Secondary | ICD-10-CM | POA: Diagnosis not present

## 2021-01-05 DIAGNOSIS — J449 Chronic obstructive pulmonary disease, unspecified: Secondary | ICD-10-CM | POA: Diagnosis not present

## 2021-01-05 DIAGNOSIS — Z792 Long term (current) use of antibiotics: Secondary | ICD-10-CM | POA: Diagnosis not present

## 2021-01-05 DIAGNOSIS — Z7952 Long term (current) use of systemic steroids: Secondary | ICD-10-CM | POA: Diagnosis not present

## 2021-01-05 DIAGNOSIS — J189 Pneumonia, unspecified organism: Secondary | ICD-10-CM | POA: Diagnosis not present

## 2021-01-05 DIAGNOSIS — J9600 Acute respiratory failure, unspecified whether with hypoxia or hypercapnia: Secondary | ICD-10-CM | POA: Diagnosis not present

## 2021-01-05 DIAGNOSIS — E669 Obesity, unspecified: Secondary | ICD-10-CM | POA: Diagnosis not present

## 2021-01-05 DIAGNOSIS — A419 Sepsis, unspecified organism: Secondary | ICD-10-CM | POA: Diagnosis not present

## 2021-01-05 DIAGNOSIS — Z9981 Dependence on supplemental oxygen: Secondary | ICD-10-CM | POA: Diagnosis not present

## 2021-01-05 DIAGNOSIS — J441 Chronic obstructive pulmonary disease with (acute) exacerbation: Secondary | ICD-10-CM | POA: Diagnosis not present

## 2021-01-05 DIAGNOSIS — R0902 Hypoxemia: Secondary | ICD-10-CM | POA: Diagnosis not present

## 2021-01-08 ENCOUNTER — Telehealth: Payer: Self-pay

## 2021-01-08 NOTE — Telephone Encounter (Signed)
[name] [facility] RN calls nurse line requesting verbal orders for skilled nursing as follows.   1x a week for 4 weeks  3 PRN   Verbal orders left on confidential VM per [facility] protocol.

## 2021-01-11 ENCOUNTER — Telehealth: Payer: Self-pay

## 2021-01-11 ENCOUNTER — Other Ambulatory Visit: Payer: Self-pay | Admitting: Family Medicine

## 2021-01-11 NOTE — Telephone Encounter (Signed)
Judy from [place] for [place] verbal orders as follows:  1 time(s) weekly for 4 week(s)  Verbal orders given per [facility] protocol  Talbot Grumbling, RN

## 2021-01-15 ENCOUNTER — Telehealth: Payer: Self-pay

## 2021-01-15 NOTE — Telephone Encounter (Signed)
Received phone call from Speech therapist regarding abnormal VS reading. Reports initial BP of 90/44 this morning. BP rechecked at was 102/58.   Called patient to check in.   Patient does report dizziness and migraine headache since Sunday. Patient reports one episode of emesis yesterday.  Denies diarrhea.   Precepted with Dr. Gwendlyn Deutscher. Recommended UC/ED evaluation for above concerns.   Patient informed of above. Patient verbalizes understanding.   Talbot Grumbling, RN

## 2021-01-17 ENCOUNTER — Ambulatory Visit: Payer: Medicaid Other | Admitting: Neurology

## 2021-01-18 ENCOUNTER — Other Ambulatory Visit: Payer: Self-pay

## 2021-01-18 ENCOUNTER — Ambulatory Visit (INDEPENDENT_AMBULATORY_CARE_PROVIDER_SITE_OTHER): Payer: Medicaid Other | Admitting: Family Medicine

## 2021-01-18 ENCOUNTER — Encounter: Payer: Self-pay | Admitting: Family Medicine

## 2021-01-18 VITALS — BP 84/50 | HR 54 | Ht 61.0 in | Wt 184.0 lb

## 2021-01-18 DIAGNOSIS — G8929 Other chronic pain: Secondary | ICD-10-CM

## 2021-01-18 DIAGNOSIS — E860 Dehydration: Secondary | ICD-10-CM | POA: Diagnosis not present

## 2021-01-18 DIAGNOSIS — R519 Headache, unspecified: Secondary | ICD-10-CM | POA: Diagnosis not present

## 2021-01-18 DIAGNOSIS — J690 Pneumonitis due to inhalation of food and vomit: Secondary | ICD-10-CM | POA: Diagnosis not present

## 2021-01-18 DIAGNOSIS — R1319 Other dysphagia: Secondary | ICD-10-CM

## 2021-01-18 DIAGNOSIS — I272 Pulmonary hypertension, unspecified: Secondary | ICD-10-CM

## 2021-01-18 DIAGNOSIS — Z20822 Contact with and (suspected) exposure to covid-19: Secondary | ICD-10-CM | POA: Diagnosis not present

## 2021-01-18 DIAGNOSIS — J449 Chronic obstructive pulmonary disease, unspecified: Secondary | ICD-10-CM | POA: Diagnosis not present

## 2021-01-18 DIAGNOSIS — G4733 Obstructive sleep apnea (adult) (pediatric): Secondary | ICD-10-CM

## 2021-01-18 MED ORDER — ALBUTEROL SULFATE (2.5 MG/3ML) 0.083% IN NEBU: INHALATION_SOLUTION | RESPIRATORY_TRACT | 8 refills | Status: AC

## 2021-01-18 NOTE — Assessment & Plan Note (Signed)
Normal respiratory effort on 4L [place].  Refilled nebulizer per patient request  Amb referral to pulmonology for pulmonary HTN, confirmed correct phone number due to prior referral unable to contact the patient with previous appt Completed steroid taper  Continue home inhaler regimen  Continue supplemental oxygen Completed clindamycin regimen

## 2021-01-18 NOTE — Progress Notes (Addendum)
SUBJECTIVE:   CHIEF COMPLAINT / HPI: hospital follow up for aspiration PNA   Aspiration pneumonia Patient presents with her daughter-in-law, Arbie Cookey. Arbie Cookey states that since the patient returned home from her hospitalization on 01/05/2021, she has been lethargic. Patient states that she has been able to complete her clindamycin course as well as a steroid taper following her hospitalization.  She states that her breathing feels improved on the 4 L via nasal cannula.  Patient also states that she has a CPAP machine to wear at night for her OSA.  Patient states that she has not smoked since 12/31/2018.    Persistent headache Daughter-in-law reports concerns for patient's persistent headache.  She is reporting 10 out of 10 pain.  Patient was recently stopped on her Aimovig injections during hospitalization.  She reports that she has had a headache for the last few weeks since 5/28 and has vomited 3 times today.  She reports decreased appetite and has a hard time with eating due to dysphagia.  They report that she has an appointment on 6/14 for a swallow study.  Patient denies any hematuria, hematochezia, or hematemesis.  Patient also noted to be hypotensive on initial evaluation in clinic today with blood pressure of 84/50.  This improved to 100/60 later in the visit.  Patient seems to have slow verbal responses elevations at times.  She reports headache as "all over", endorses photophobia, reports vertigo when trying to lie down for sleep but denies blurry vision.  Daughter-in-law expresses concern that patient has had decreased urinary occurrences since being in the hospital.    PERTINENT  PMH / PSH:  DHF  Pulmonary HTN COPD  OBJECTIVE:   BP (!) 84/50   Pulse (!) 54   Ht 5\' 1"  (1.549 m)   Wt 184 lb (83.5 kg)   SpO2 98%   BMI 34.77 kg/m   General: female appearing stated age, HEENT: Dry MM, no oral lesions noted,Neck non-tender without lymphadenopathy, masses or thyromegaly Cardio:  Normal S1 and S2, no S3 or S4. Rhythm is regular. No murmurs or rubs.  Bilateral radial pulses palpable Pulm: Patient has bilateral diffuse crackles with inspiration, decreased aeration bilaterally, no signs of respiratory distress, normal respiratory effort on 4 L via nasal cannula Extremities: Trace peripheral edema. Warm/ well perfused.  Neuro: pt alert well answering questions however appears to respond slowly during the later part of interview, pupils are equal and reactive to light, extraocular muscles are intact however patient endorses vertigo symptoms with extraocular muscle test, sensation intact in all 4 extremities to light touch   ASSESSMENT/PLAN:   Aspiration pneumonia ([place]) Normal respiratory effort on 4L [place].  Refilled nebulizer per patient request  Amb referral to pulmonology for pulmonary HTN, confirmed correct phone number due to prior referral unable to contact the patient with previous appt Completed steroid taper  Continue home inhaler regimen  Continue supplemental oxygen Completed clindamycin regimen    Dehydration Patient previously hypotensive to 84/50.  Also concern for dry mucous membranes and concern for slow response and persistent headache associated with 3 episodes of emesis earlier today. Recommended that patient and daughter-in-law present to the ED for evaluation, concerned that patient needs IV hydration due to dehydration   Headache Concerned the patient may need head imaging for persistent headache associated with vomiting and dehydration in setting of dysphagia with recent aspiration PNA  Recommending ED evaluation today for head imaging   Dysphagia Continued difficulty with swallowing minced solids, able to drink minimal  amount of fluids. Has urinated once today. Concerned that patient's dysphagia may be contributing to dehydration and will need swallow study completed sooner than appt scheduled for safe nutrition and oral hydration in setting of  hypotension      Eulis Foster, MD [facility]

## 2021-01-18 NOTE — Assessment & Plan Note (Addendum)
Concerned the patient may need head imaging for persistent headache associated with vomiting and dehydration in setting of dysphagia with recent aspiration PNA  Recommending ED evaluation today for head imaging

## 2021-01-18 NOTE — Assessment & Plan Note (Signed)
Continued difficulty with swallowing minced solids, able to drink minimal amount of fluids. Has urinated once today. Concerned that patient's dysphagia may be contributing to dehydration and will need swallow study completed sooner than appt scheduled for safe nutrition and oral hydration in setting of hypotension

## 2021-01-18 NOTE — Assessment & Plan Note (Signed)
Patient previously hypotensive to 84/50.  Also concern for dry mucous membranes and concern for slow response and persistent headache associated with 3 episodes of emesis earlier today. Recommended that patient and daughter-in-law present to the ED for evaluation, concerned that patient needs IV hydration due to dehydration

## 2021-01-18 NOTE — Patient Instructions (Signed)
It was a pleasure to see you today!  Thank you for choosing [facility] for your primary care.   [name] was seen for hospital follow up for aspiration pneumonia.   Our plans for today were:  My recommendation is that we have you evaluated in the ED as soon as possible for your persistent headache and vomiting and concern for dehydration with your difficulty swallowing.  We have placed a replacement referral to pulmonology. PLEASE BE SURE TO ANSWER OR RETURN CALLS FOR YOUR APPOINTMENT.    To keep you healthy, please keep in mind the following health maintenance items that you are due for:   Mammogram  Foot Exam  Pneumonia Vaccine  Shingles Vaccine     You should return to our clinic in 1 week.   Best Wishes,   Dr. Alba Cory

## 2021-01-22 ENCOUNTER — Other Ambulatory Visit (HOSPITAL_COMMUNITY): Payer: Self-pay

## 2021-01-22 ENCOUNTER — Ambulatory Visit (INDEPENDENT_AMBULATORY_CARE_PROVIDER_SITE_OTHER): Payer: Medicaid Other | Admitting: Physician Assistant

## 2021-01-22 ENCOUNTER — Encounter: Payer: Self-pay | Admitting: Physician Assistant

## 2021-01-22 ENCOUNTER — Telehealth: Payer: Self-pay | Admitting: Family Medicine

## 2021-01-22 ENCOUNTER — Other Ambulatory Visit: Payer: Self-pay

## 2021-01-22 VITALS — BP 92/58 | HR 68 | Ht 61.0 in | Wt 186.0 lb

## 2021-01-22 DIAGNOSIS — J9611 Chronic respiratory failure with hypoxia: Secondary | ICD-10-CM | POA: Diagnosis not present

## 2021-01-22 DIAGNOSIS — Z1212 Encounter for screening for malignant neoplasm of rectum: Secondary | ICD-10-CM

## 2021-01-22 DIAGNOSIS — R1310 Dysphagia, unspecified: Secondary | ICD-10-CM | POA: Diagnosis not present

## 2021-01-22 DIAGNOSIS — K219 Gastro-esophageal reflux disease without esophagitis: Secondary | ICD-10-CM | POA: Diagnosis not present

## 2021-01-22 DIAGNOSIS — Z9981 Dependence on supplemental oxygen: Secondary | ICD-10-CM | POA: Diagnosis not present

## 2021-01-22 DIAGNOSIS — Z1211 Encounter for screening for malignant neoplasm of colon: Secondary | ICD-10-CM | POA: Diagnosis not present

## 2021-01-22 MED ORDER — PANTOPRAZOLE SODIUM 40 MG PO TBEC: 40.0000 mg | DELAYED_RELEASE_TABLET | Freq: Two times a day (BID) | ORAL | 11 refills | Status: DC

## 2021-01-22 NOTE — Telephone Encounter (Signed)
Patients husband dropped off form to be completed by Dr. Alba Cory for [employer]. LDOS 01-18-21  They would like the form to be faxed to the number on form.   I have placed form in red team folder.

## 2021-01-22 NOTE — Telephone Encounter (Signed)
Reviewed form and placed in PCP's box for completion.  .Dorleen Kissel R Jeanenne Licea, CMA  

## 2021-01-22 NOTE — Patient Instructions (Signed)
If you are age 63 or older, your body mass index should be between 23-30. Your Body mass index is 35.14 kg/m. If this is out of the aforementioned range listed, please consider follow up with your Primary Care Provider.  If you are age 31 or younger, your body mass index should be between 19-25. Your Body mass index is 35.14 kg/m. If this is out of the aformentioned range listed, please consider follow up with your Primary Care Provider.  Stop Prilosec and start Pantoprazole 40 mg twice daily.    You have been scheduled for a modified barium swallow on 01/30/21 at 11:30am. Please arrive 15 minutes prior to your test for registration. You will go to [facility] (1st Floor) for your appointment. Should you need to cancel or reschedule your appointment, please contact [phone] [facility] [place]) or [phone] [place]). _____________________________________________________________________ A Modified Barium Swallow Study, or MBS, is a special x-ray that is taken to check swallowing skills. It is carried out by a Stage manager and a Psychologist, clinical (SLP). During this test, yourmouth, throat, and esophagus, a muscular tube which connects your mouth to your stomach, is checked. The test will help you, your doctor, and the SLP plan what types of foods and liquids are easier for you to swallow. The SLP will also identify positions and ways to help you swallow more easily and safely. What will happen during an MBS? You will be taken to an x-ray room and seated comfortably. You will be asked to swallow small amounts of food and liquid mixed with barium. Barium is a liquid or paste that allows images of your mouth, throat and esophagus to be seen on x-ray. The x-ray captures moving images of the food you are swallowing as it travels from your mouth through your throat and into your esophagus. This test helps identify whether food or liquid is entering your lungs (aspiration). The test also  shows which part of your mouth or throat lacks strength or coordination to move the food or liquid in the right direction. This test typically takes 30 minutes to 1 hour to complete. _______________________________________________________________________  The [facility] providers would like to encourage you to use [facility] to communicate with providers for non-urgent requests or questions.  Due to long hold times on the telephone, sending your provider a message by [facility] may be a faster and more efficient way to get a response.  Please allow 48 business hours for a response.  Please remember that this is for non-urgent requests.   It was a pleasure to see you today!  Thank you for trusting me with your gastrointestinal care!    Ellouise Newer, PA-C

## 2021-01-22 NOTE — Progress Notes (Signed)
____________________________________________________________  Attending physician addendum:  Thank you for sending this case to me. I have reviewed the entire note and agree with the plan.  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 I need to see her MBS report before any consideration is given to a high-risk upper endoscopy for her. She appears to have Care Management follow up for her complex issues.  Wilfrid Lund, MD  ____________________________________________________________

## 2021-01-22 NOTE — Progress Notes (Signed)
Chief Complaint: Dysphagia  HPI:    [name] is a 65 year old female with a past medical history as listed below including COPD, recent echo 12/31/2020 with LVEF 60%., epilepsy, emphysema, reflux and multiple others, previously known to Dr. Olevia Perches, who was referred to me by [name] for a complaint of dysphagia.    10/29/2011 colonoscopy with Dr. Olevia Perches for history of adenomatous colon polyps, 2007-tubular adenoma with no polyps or cancers, completely normal.  Repeat recommended in 10 years.  EGD on the same day for dysphagia with normal esophagus, status post passage of a 48 [place] dilator.  Pathology showed reflux changes.    11/22/2019 modified barium swallow with mild pharyngeal dysphagia resulting in laryngeal vestibule penetration with thin liquids likely due to chronic respiratory compromise of COPD.    01/18/2021 patient seen in clinic by her PCP for hospital follow-up after aspiration pneumonia.  She returned home on 01/05/2021 and had been lethargic.  She completed a clindamycin course and steroid taper.  She was on 4 L oxygen via nasal cannula.  She was referred to pulmonology for possible pulmonary hypertension.    Today, the patient tells me that she has had worsened dysphagia over the past 2 months noting that food is getting stuck now not just at the top of her throat but down towards the bottom before it enters her stomach.  Explains that breads and meats tend to be worse and this happens on an almost daily basis, sometimes she has to regurgitate them when they get stuck.  Also describes breakthrough reflux symptoms regardless of her Omeprazole 40 mg twice daily on a daily basis.  Denies abdominal pain or change in stools.    Also discusses today that she is developing a bedsore and wonders what to do for this.    Denies fever, chills, weight loss, blood in her stool or symptoms that awaken her from sleep.  Past Medical History:  Diagnosis Date   Allergic rhinitis     Arthritis    "hands and legs and feet" (04/27/2012)   Cancer ([place]) 05   rt arm mole rem cancer   Chronic bronchitis ([place])    "keep it all the time" (04/27/2012)   COPD (chronic obstructive pulmonary disease) (H[facility])    Depression    Diverticulosis    DJD (degenerative joint disease)    Emphysema    Epilepsy ([place])    Esophageal stricture    Exertional dyspnea    GERD (gastroesophageal reflux disease)    Glaucoma    H/O blood clots    Right leg   Hyperlipidemia    Hypertension    IBS (irritable bowel syndrome)    Migraine    Neck pain    Cervical disc   OSA on CPAP    6-7 yrs; pt wears CPAP but has not been able to use it lately due to sinus issues   Pneumonia 2012; 04/27/2012   Right leg DVT ([place]) 1982   groin   Seizures ([place])    Type II diabetes mellitus ([place])    No meds since weight loss   UTI (urinary tract infection)    2 weeks ago   Vertigo 10/24/2014    Past Surgical History:  Procedure Laterality Date   ABDOMINAL HYSTERECTOMY  2001   "partial"   CARPAL TUNNEL RELEASE  ~ 2008   right   CATARACT EXTRACTION Bilateral    COLONOSCOPY WITH ESOPHAGOGASTRODUODENOSCOPY (EGD)     KNEE ARTHROSCOPY  1990's  left   NASAL SEPTOPLASTY W/ TURBINOPLASTY Bilateral 05/23/2016   Procedure: NASAL SEPTOPLASTY WITH TURBINATE REDUCTION;  Surgeon: Jerrell Belfast, MD;  Location: [facility];  Service: ENT;  Laterality: Bilateral;   SHOULDER OPEN ROTATOR CUFF REPAIR  ~ 2010   left   SINUS ENDO WITH FUSION Right 05/23/2016   Procedure: LIMITED RIGHT ENDOSCOPIC SINUS SURGERY;  Surgeon: Jerrell Belfast, MD;  Location: [facility];  Service: ENT;  Laterality: Right;   TOTAL KNEE ARTHROPLASTY  08/23/2012   right   TOTAL KNEE ARTHROPLASTY  08/23/2012   Procedure: TOTAL KNEE ARTHROPLASTY;  Surgeon: Lorn Junes, MD;  Location: [facility];  Service: Orthopedics;  Laterality: Right;  RIGHT KNEE ARTHROPLASTY MEDIAL AND LATERAL COMPARTMENTS WITH PATELLA RESURFACING   TUBAL LIGATION  2001    Current  Outpatient Medications  Medication Sig Dispense Refill   acetaminophen (TYLENOL) 500 MG tablet Take 1,000 mg by mouth every other day.     albuterol (PROAIR HFA) 108 (90 Base) MCG/ACT inhaler INHALE TWO PUFFS INTO THE LUNGS EVERY 6 HOURS AS NEEDED FOR SHORTNESS OF BREATH 18 g 6   albuterol (PROVENTIL) (2.5 MG/3ML) 0.083% nebulizer solution USE 1 VIAL IN NEBULIZER EVERY 6 HOURS AS NEEDED FOR SHORTNESS OF BREATH 75 mL 8   ALLERGY RELIEF 10 MG tablet TAKE ONE TABLET BY MOUTH DAILY 60 tablet 2   aspirin EC 81 MG tablet Take 81 mg by mouth daily. Swallow whole.     Cholecalciferol (VITAMIN D3) 50 MCG (2000 UT) capsule TAKE ONE CAPSULE BY MOUTH DAILY 90 capsule 1   DULERA 200-5 MCG/ACT AERO INHALE TWO PUFFS INTO THE LUNGS TWICE DAILY 13 g 6   ferrous sulfate 324 (65 Fe) MG TBEC Take 1 tablet (324 mg total) by mouth every other day. 90 tablet 0   gabapentin (NEURONTIN) 100 MG capsule Take 1 capsule (100 mg total) by mouth at bedtime. 90 capsule 3   lamoTRIgine (LAMICTAL) 200 MG tablet Take 1.5 tablets (300 mg total) by mouth 2 (two) times daily. 270 tablet 3   omeprazole (PRILOSEC) 40 MG capsule TAKE ONE CAPSULE BY MOUTH TWICE DAILY 60 capsule 3   OXYGEN Inhale 4 L into the lungs continuous.     polyethylene glycol powder (GLYCOLAX/MIRALAX) powder MIX 17 GMS IN LIQUID AND DRINK ONCE DAILY 527 g 0   QUEtiapine (SEROQUEL) 100 MG tablet Take 1 tablet (100 mg total) by mouth at bedtime. 90 tablet 1   sertraline (ZOLOFT) 50 MG tablet Take 1 tablet (50 mg total) by mouth daily. 60 tablet 2   simvastatin (ZOCOR) 40 MG tablet Take 1 tablet (40 mg total) by mouth at bedtime. 90 tablet 2   topiramate (TOPAMAX) 100 MG tablet TAKE ONE TABLET BY MOUTH EVERY MORNING AND 2 TABLETS AT BEDTIME 270 tablet 3   No current facility-administered medications for this visit.    Allergies as of 01/22/2021 - Review Complete 01/22/2021  Allergen Reaction Noted   Amoxicillin Anaphylaxis 04/25/2012   Azithromycin Swelling and  Rash 06/01/2011   Cefdinir  01/12/2017   Chantix [varenicline] Other (See Comments) 08/16/2012   Doxycycline hyclate Anaphylaxis and Rash 06/19/2010   Levofloxacin Anaphylaxis 08/05/2014   Penicillins Anaphylaxis, Shortness Of Breath, and Rash 05/12/2007   Sulfonamide derivatives Shortness Of Breath, Swelling, and Rash 05/12/2007   Toradol [ketorolac tromethamine] Other (See Comments) 01/18/2019   Clarithromycin Hives 05/12/2007   Nortriptyline Swelling, Rash, and Other (See Comments) 11/30/2011   Codeine Nausea And Vomiting 10/24/2014   Fluticasone-salmeterol Other (See Comments) 02/13/2010  Triptans Rash 11/13/2015    Family History  Problem Relation Age of Onset   Diabetes Mother    Lung cancer Mother        was a smoker   Stroke Mother    Alcohol abuse Father    Lung cancer Father        was a smoker   Emphysema Father        was a smoker   Heart disease Sister    Other Sister        blood clotting disorder   Diabetes Brother    Seizures Brother    Diabetes Maternal Aunt    Breast cancer Maternal Aunt    Diabetes Maternal Uncle    Heart disease Maternal Grandfather    Diabetes Maternal Grandfather    Bipolar disorder Son    Muscular dystrophy Son    Heart disease Son    Heart disease Other        uncle   Colon cancer Neg Hx    Esophageal cancer Neg Hx    Pancreatic cancer Neg Hx    Stomach cancer Neg Hx     Social History   Socioeconomic History   Marital status: Married    Spouse name: [name]   Number of children: 2   Years of education: 12   Highest education level: Not on file  Occupational History   Occupation: disabled  Tobacco Use   Smoking status: Former    Packs/day: 1.00    Years: 40.00    Pack years: 40.00    Types: Cigarettes    Start date: 08/12/1975    Quit date: 12/30/2020    Years since quitting: 0.0   Smokeless tobacco: Former    Quit date: 08/23/2012  Vaping Use   Vaping Use: Never used  Substance and Sexual Activity   Alcohol  use: No    Alcohol/week: 0.0 standard drinks   Drug use: No   Sexual activity: Never  Other Topics Concern   Not on file  Social History Narrative   Patient lives at home with husband [name], her son and his wife.    Patient has 2 children.    Patient has a high school education.    Patient is currently unemployed.    Patient is right handed.    Patient drinks 2 cups of caffeine per day.   Social Determinants of Health   Financial Resource Strain: Not on file  Food Insecurity: Not on file  Transportation Needs: Not on file  Physical Activity: Not on file  Stress: Not on file  Social Connections: Not on file  Intimate Partner Violence: Not on file    Review of Systems:    Constitutional: No weight loss, fever or chills Skin: No rash Cardiovascular: No chest pain Respiratory: No SOB Gastrointestinal: See HPI and otherwise negative Genitourinary: No dysuria  Neurological: No headache, dizziness or syncope Musculoskeletal: No new muscle or joint pain Hematologic: No bleeding  Psychiatric: No history of depression or anxiety   Physical Exam:  Vital signs: BP (!) 92/58 (BP Location: Right Arm, Patient Position: Sitting, Cuff Size: Normal)   Pulse 68   Ht 5\' 1"  (1.549 m)   Wt 186 lb (84.4 kg)   SpO2 100%   BMI 35.14 kg/m   Constitutional:   Pleasant ill-appearing, Caucasian female appears to be in NAD, Well developed, Well nourished, alert and cooperative Head:  Normocephalic and atraumatic. Eyes:   PEERL, EOMI. No icterus. Conjunctiva pink. Ears:  Normal auditory acuity. Neck:  Supple Throat: Oral cavity and pharynx without inflammation, swelling or lesion.  Respiratory: Respirations even and unlabored. Lungs clear to auscultation bilaterally.   No wheezes, crackles, or rhonchi. + On 4 L O2 via nasal cannula Cardiovascular: Normal S1, S2. No MRG. Regular rate and rhythm. No peripheral edema, cyanosis or pallor.  Gastrointestinal:  Soft, nondistended, nontender. No  rebound or guarding. Normal bowel sounds. No appreciable masses or hepatomegaly. Rectal:  Not performed.  Msk:  Symmetrical without gross deformities. Without edema, no deformity or joint abnormality. + In a wheelchair Neurologic:  Alert and  oriented x4;  grossly normal neurologically.  Skin:   Dry and intact without significant lesions or rashes. Psychiatric: Oriented to person, place and time. Demonstrates good judgement and reason without abnormal affect or behaviors.  RELEVANT LABS AND IMAGING: CBC    Component Value Date/Time   WBC 9.6 04/10/2020 1418   WBC 10.7 (H) 05/15/2016 1241   RBC 4.13 04/10/2020 1418   RBC 3.86 (L) 05/15/2016 1241   HGB 11.6 04/10/2020 1418   HCT 35.7 04/10/2020 1418   PLT 238 04/10/2020 1418   MCV 86 04/10/2020 1418   MCH 28.1 04/10/2020 1418   MCH 31.1 05/15/2016 1241   MCHC 32.5 04/10/2020 1418   MCHC 32.3 05/15/2016 1241   RDW 17.5 (H) 04/10/2020 1418   LYMPHSABS 2.2 04/10/2020 1418   MONOABS 0.7 08/05/2014 1321   EOSABS 0.1 04/10/2020 1418   BASOSABS 0.1 04/10/2020 1418    CMP     Component Value Date/Time   NA 139 04/10/2020 1418   K 4.3 04/10/2020 1418   CL 105 04/10/2020 1418   CO2 21 04/10/2020 1418   GLUCOSE 91 04/10/2020 1418   GLUCOSE 82 06/13/2016 1441   BUN 13 04/10/2020 1418   CREATININE 1.02 (H) 04/10/2020 1418   CREATININE 0.91 06/13/2016 1441   CALCIUM 8.6 (L) 04/10/2020 1418   PROT 7.1 04/10/2020 1418   ALBUMIN 3.9 04/10/2020 1418   AST 12 04/10/2020 1418   ALT 6 04/10/2020 1418   ALKPHOS 156 (H) 04/10/2020 1418   BILITOT <0.2 04/10/2020 1418   GFRNONAA 59 (L) 04/10/2020 1418   GFRNONAA 70 06/13/2016 1441   GFRAA 68 04/10/2020 1418   GFRAA 80 06/13/2016 1441    Assessment: 1.  Dysphagia: Trouble swallowing solids, now feeling at the bottom of her esophagus as well as the top, previously thought related to her breathing per modified barium swallow and 21, also previous dilation in 2013; consider stricture+/-  COPD 2.  COPD on oxygen: Using 4 L, recent aspiration pneumonia possibly related to above 3.  GERD: Breakthrough symptoms regardless of Omeprazole 40 mg twice daily; consider relation to COPD+/- ongoing gastritis 4.  Screening for colorectal cancer: Last colonoscopy in 2013, repeat recommended in 10 years, she is not due yet  Plan: 1.  Discussed with patient that due to her oxygen use if we need to do an EGD she would be high risk for the procedure and would need to be done at the hospital.  Due to this we will go ahead and repeat a modified barium swallow today to confirm that she truly needs an EGD.  She verbalized understanding.  This was scheduled. 2.  Pending results from above patient will be called, may need an EGD in the hospital setting with dilation. 3.  Stopped Prilosec as this is not helping anymore.  Started Pantoprazole 40 mg twice daily, 30-60 minutes before breakfast and dinner.  Prescribed #60 with 5 refills. 4.  Reviewed ENT dysphagia measures. 6.  Briefly discussed patient's bedsore.  Recommended barrier ointment and bandage for now and changing positions as often as she can.  If it is not getting better would recommend she get a referral to the wound care clinic who can take care of this for her better. 7.  Patient to follow in clinic per recommendations after imaging above. Patient assigned to Dr. Loletha Carrow today.  Ellouise Newer, PA-C [facility] 01/22/2021, 10:18 AM  Cc: Donnal Moat*

## 2021-01-23 NOTE — Telephone Encounter (Signed)
Physician portion of the form completed and placed in RN triage box.   Eulis Foster, MD [place], PGY-2

## 2021-01-24 ENCOUNTER — Other Ambulatory Visit: Payer: Self-pay

## 2021-01-24 ENCOUNTER — Other Ambulatory Visit: Payer: Self-pay | Admitting: *Deleted

## 2021-01-24 NOTE — Patient Outreach (Signed)
Care Coordination  01/24/2021  [name] 12-Dec-1955 [id number]  RNCM had successful outreach with [name] today. She was at home and having PT at the time. RNCM will reach out to [name] again tomorrow. [name] agrees with this plan.  Veronica Joiner RN, BSN [place]  [employer] manager

## 2021-01-25 ENCOUNTER — Telehealth: Payer: Self-pay

## 2021-01-25 ENCOUNTER — Other Ambulatory Visit: Payer: Self-pay

## 2021-01-25 ENCOUNTER — Other Ambulatory Visit: Payer: Medicaid Other | Admitting: *Deleted

## 2021-01-25 NOTE — Patient Outreach (Signed)
Medicaid Managed Care   Nurse Care Manager Note  01/25/2021 Name:  [name] MRN:  [id number] DOB:  [birth year]  [name] is an 65 y.o. year old female who is a primary patient of [name], [name], MD.  The Medicaid Managed Care Coordination team was consulted for assistance with:    CHF COPD  Ms. Callies was given information about [employer] team services today. [place] agreed to services and verbal consent obtained.  Engaged with patient by telephone for follow up visit in response to provider referral for case management and/or care coordination services.   Assessments/Interventions:  Review of past medical history, allergies, medications, health status, including review of consultants reports, laboratory and other test data, was performed as part of comprehensive evaluation and provision of chronic care management services.  SDOH (Social Determinants of Health) assessments and interventions performed:   Care Plan  Allergies  Allergen Reactions   Amoxicillin Anaphylaxis    Breakout, mouth swells   Azithromycin Swelling and Rash    "swelling all over, high, thrush"   Cefdinir     Throat swelling after 3rd dose   Chantix [Varenicline] Other (See Comments)    "bad dreams, difficulty breathing"   Doxycycline Hyclate Anaphylaxis and Rash    swelling   Levofloxacin Anaphylaxis   Penicillins Anaphylaxis, Shortness Of Breath and Rash    "ALMOST DIED, ended up in hospital"  PCN reaction causing immediate rash, facial/tongue/throat swelling, SOB or lightheadedness with hypotension: YES PCN reaction causing severe rash involving mucus membranes or skin necrosis: NO PCN reaction that required hospitalization YES Has patient had a PCN reaction occurring within the last 10 years: NO    Sulfonamide Derivatives Shortness Of Breath, Swelling and Rash    "break out from head to toe"   Toradol [Ketorolac Tromethamine] Other (See Comments)    Seizure     Clarithromycin Hives    swelling   Nortriptyline Swelling, Rash and Other (See Comments)    Per patient had "kidney problems" on this medication, could not use bathroom (urinary retention)   Codeine Nausea And Vomiting   Fluticasone-Salmeterol Other (See Comments)    REACTION: ulcers in mouth   Triptans Rash    Mouth breaks out and start itching    Medications Reviewed Today     Reviewed by [name], PA (Physician Assistant) on 01/22/21 at 1043  Med List Status: <None>   Medication Order Taking? Sig Documenting Provider Last Dose Status Informant  acetaminophen (TYLENOL) 500 MG tablet [id number] Yes Take 1,000 mg by mouth every other day. [provider] Taking Active Self  albuterol (PROAIR HFA) 108 (90 Base) MCG/ACT inhaler [id number] Yes INHALE TWO PUFFS INTO THE LUNGS EVERY 6 HOURS AS NEEDED FOR SHORTNESS OF BREATH [name], [name], MD Taking Active   albuterol (PROVENTIL) (2.5 MG/3ML) 0.083% nebulizer solution [id number] Yes USE 1 VIAL IN NEBULIZER EVERY 6 HOURS AS NEEDED FOR SHORTNESS OF BREATH [name], [name], MD Taking Active   ALLERGY RELIEF 10 MG tablet [id number] Yes TAKE ONE TABLET BY MOUTH DAILY [name], [name], MD Taking Active   aspirin EC 81 MG tablet [id number] Yes Take 81 mg by mouth daily. Swallow whole. [provider] Taking Active   Cholecalciferol (VITAMIN D3) 50 MCG (2000 UT) capsule [id number] Yes TAKE ONE CAPSULE BY MOUTH DAILY [name], [name], MD Taking Active   DULERA 200-5 MCG/ACT AERO [id number] Yes INHALE TWO PUFFS INTO THE LUNGS TWICE DAILY [name], [name], MD Taking Active   ferrous  sulfate 324 (65 Fe) MG TBEC [id number] Yes Take 1 tablet (324 mg total) by mouth every other day. [name], [name], MD Taking Active   gabapentin (NEURONTIN) 100 MG capsule [id number] Yes Take 1 capsule (100 mg total) by mouth at bedtime. [name], [name], MD Taking Active            Med  Note Bonnita Levan Nov 19, 2020 11:10 AM)    lamoTRIgine (LAMICTAL) 200 MG tablet [id number] Yes Take 1.5 tablets (300 mg total) by mouth 2 (two) times daily. [name], NP Taking Active   omeprazole (PRILOSEC) 40 MG capsule [id number] Yes TAKE ONE CAPSULE BY MOUTH TWICE DAILY [name], [name], MD Taking Active   OXYGEN [id number] Yes Inhale 4 L into the lungs continuous. [provider] Taking Active   pantoprazole (PROTONIX) 40 MG tablet [id number] Yes Take 1 tablet (40 mg total) by mouth 2 (two) times daily. [name], [place]  Active   polyethylene glycol powder [name]) powder [id number] Yes MIX 17 GMS IN LIQUID AND DRINK ONCE DAILY [name], MD Taking Active   QUEtiapine (SEROQUEL) 100 MG tablet [id number] Yes Take 1 tablet (100 mg total) by mouth at bedtime. [name], NP Taking Active   sertraline (ZOLOFT) 50 MG tablet [id number] Yes Take 1 tablet (50 mg total) by mouth daily. [name], [name], MD Taking Active   simvastatin (ZOCOR) 40 MG tablet [id number] Yes Take 1 tablet (40 mg total) by mouth at bedtime. [name], [name], MD Taking Active   topiramate (TOPAMAX) 100 MG tablet [id number] Yes TAKE ONE TABLET BY MOUTH EVERY MORNING AND 2 TABLETS AT BEDTIME [name], NP Taking Active             Patient Active Problem List   Diagnosis Date Noted   Dehydration 01/18/2021   Dysphagia 12/20/2020   Radiculopathy affecting upper extremity 11/12/2020   Encounter for wheelchair assessment 05/16/2020   Healthcare maintenance 05/16/2020   Shortness of breath 07/26/2019   Tobacco use disorder 07/02/2018   Elevated serum creatinine 06/01/2018   Weakness 05/15/2017   Chronic bronchitis ([place]) 01/14/2017   Claudication ([place]) 04/09/2016   Seizure disorder ([place]) 11/12/2015   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 05/23/2015   Chronic obstructive pulmonary disease ([place]) 04/25/2015    Dizziness 10/24/2014   Pulmonary hypertension ([place]) 18/29/9371   Diastolic CHF ([place]) 69/67/8938   BPPV (benign paroxysmal positional vertigo) 03/09/2013   Chronic respiratory failure ([place]) 11/01/2012   Left shoulder pain 06/27/2012   Headache 05/09/2012   Aspiration pneumonia ([place]) 04/26/2012   ANXIETY 02/13/2010   Obstructive sleep apnea 02/19/2009   Vitamin D deficiency 01/21/2008   OSTEOPENIA 11/24/2007   Irritable bowel syndrome 11/17/2007   Allergic rhinitis 09/24/2007   Hyperlipidemia 10/08/2006   OBESITY, NOS  BMI 33.8 10/08/2006   GLAUCOMA 10/08/2006   GASTROESOPHAGEAL REFLUX, NO ESOPHAGITIS 10/08/2006   HERNIA, HIATAL, NONCONGENITAL 10/08/2006   INCONTINENCE, STRESS, FEMALE 10/08/2006   DJD (degenerative joint disease) of knee 10/08/2006    Conditions to be addressed/monitored per PCP order:  CHF and COPD  Care Plan : COPD (Adult)  Updates made by [name], RN since 01/25/2021 12:00 AM     Problem: Disease Progression (COPD)      Long-Range Goal: Disease Progression Minimized or Managed   Start Date: 08/17/2020  Expected End Date: 04/29/2021  Recent Progress: On track  Priority: Medium  Note:   Current Barriers:  Chronic Disease Management support and  education needs related to COPD-Ms. Spychalski reports difficulty swallowing solids and liquids, missed her appointment for evaluation due to lack of transportation. She is not sleeping consistently at night-lost her CPAP during a recent move. She does not want to go into the hospital for a sleep study due to fear of Covid. She has not been vaccinated due to her many allergies. She is working on smoking cessation-down to 1/2 pack/day. Update- Ms. Sapp continues to work on smoking cessation, down to 6 cig/day.She was recently treated for bronchitis and is feeling better(reports she has bronchitis frequently).  A referral for Pulmonology in [place] was placed by PCP-patient to call and schedule new appointment. Patient  rescheduled her neurology appointment to June. A new GI referral has been placed-patient to call and schedule appointment. She would like for all appointments to be after June 3 due to school will be out and she will not have to be responsible for child care for her grandchildren.-Update- Patient was hospitalized in May for CHF and pnuemonia. She reports not smoking since her admission. She is feeling better, but has developed a bedsore. [place] Nurse is trying to extend visits. Ms. Burling has a swallow study scheduled 6/23. 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 -scheduled 6/23 patient will meet with RN Care Manager to address plan for sleep study to acquire new CPAP-Patient would like an in home sleep study and plans to inquire at her next neurology appointment patient will attend all scheduled medical appointments patient will work with CM team pharmacist to review medications-Met patient will work with Care Guide to address community resources/food insecurities-Met Interventions:  Inter-disciplinary care team collaboration (see longitudinal plan of care) Discussed wound and the importance of changing positions, provided different ways to change her position, encouraged a healthy diet and increasing protein intake Congratulated patient for not smoking in almost a month, encouraged her to continue Collaborate with MM Pharmacist regarding headaches after taking morning medications. Will see if he can reach out to her before next appointment on 7/11 Reviewed scheduled/upcoming provider appointments  Patient Goals/Self-Care Activities Over the next 30  days, patient will: - call [phone] for transportation to medical appointments. - begin a notebook of services in my neighborhood or community - call 211 when I need some help - follow-up on any referrals for help I am given, call to schedule an appointment for ENT [phone],  Pulmonary [phone] and GI [phone] - make a list of family or friends that I can call  - develop a new routine to improve sleep - eat healthy - get at least 7 to 8 hours of sleep at night - keep room cool and dark - practice relaxation or meditation daily   Follow Up Plan: Telephone follow up appointment with Managed Medicaid care management team member scheduled for:02/12/21 @ 12:30pm         Follow Up:  Patient agrees to Care Plan and Follow-up.  Plan: The Managed Medicaid care management team will reach out to the patient again over the next 14 days.  Date/time of next scheduled RN care management/care coordination outreach:  02/12/21 @ 12:30  Lurena Joiner RN, [place] RN Care Coordinator

## 2021-01-25 NOTE — Telephone Encounter (Signed)
Called patient to let her know that her Speech pathology appointment had been rescheduled for 01/31/21 at [facility] @11 :30am. Patient stated understanding and had no questions at the end of call.

## 2021-01-25 NOTE — Telephone Encounter (Signed)
Patient calls nurse line requesting to speak to PCP regarding continued headaches and dizziness. Patient states that symptoms occur most often after she takes her morning medications. Reports that she has been having these symptoms "for a while now".   Offered to schedule patient appointment for Monday, however, patient cannot come into office until Thursday 6/23. Scheduled with Dr. Tarry Kos Thursday AM.   ED precautions given. Advised to stay hydrated and to change positions slowly.   Talbot Grumbling, RN

## 2021-01-25 NOTE — Telephone Encounter (Signed)
Christy from [facility] calling for extension nursing verbal orders as follows:  1 time(s) weekly for 4 week(s). Reports that on last assessment, patient thad slight skin break down on inner buttocks. RN orders were extended for further management and control of skin breakdown.   Verbal orders given per [facility] protocol  Talbot Grumbling, RN

## 2021-01-25 NOTE — Patient Instructions (Signed)
Visit Information  [name] was given information about Medicaid Managed Care team care coordination services as a part of their [place] Medicaid benefit. [name] verbally consented to engagement with the [facility] Managed Care team.   For questions related to your [facility], please call: [phone] or visit the homepage here: [email or web address]  If you would like to schedule transportation through your [facility], please call the following number at least 2 days in advance of your appointment: [phone].   Call the [place] at [phone], at any time, 24 hours a day, 7 days a week. If you are in danger or need immediate medical attention call 911.  [name] - following are the goals we discussed in your visit today:   Goals Addressed             This Visit's Progress    Find Help in My Community       Timeframe:  Long-Range Goal Priority:  Medium Start Date:   08/17/20                          Expected End Date:  04/29/21                     Follow up 02/12/21   - call [phone] for transportation to medical appointments. - begin a notebook of services in my neighborhood or community - call 211 when I need some help - follow-up on any referrals for help I am given, call to schedule an appointment for ENT [phone]  Pulmonary [phone] and GI [phone] - make a list of family or friends that I can call    Why is this important?   Knowing how and where to find help for yourself or family in your neighborhood and community is an important skill.  You will want to take some steps to learn how.           Manage Fatigue (Tiredness-COPD)       Follow up 02/12/21   - develop a new routine to improve sleep - eat healthy - get at least 7 to 8 hours of sleep at night - keep room cool and dark - practice relaxation  or meditation daily    Why is this important?   Feeling tired or worn out is a common symptom of COPD (chronic obstructive pulmonary disease).  Learning when you feel your best and when you need rest is important.  Managing the tiredness (fatigue) will help you be active and enjoy life.              Please see education materials related to wound care provided by MyChart link.  Patient verbalizes understanding of instructions provided today.   Telephone follow up appointment with Managed Medicaid care management team member scheduled for:02/12/21 @ 12:30pm  Lurena Joiner RN, BSN [place] RN Care Coordinator   Following is a copy of your plan of care:  Patient Care Plan: COPD (Adult)     Problem Identified: Disease Progression (COPD)      Long-Range Goal: Disease Progression Minimized or Managed   Start Date: 08/17/2020  Expected End Date: 04/29/2021  Recent Progress: On track  Priority: Medium  Note:   Current Barriers:  Chronic Disease Management support and education needs related to COPD-[name] reports difficulty swallowing solids and liquids, missed her appointment  for evaluation due to lack of transportation. She is not sleeping consistently at night-lost her CPAP during a recent move. She does not want to go into the hospital for a sleep study due to fear of Covid. She has not been vaccinated due to her many allergies. She is working on smoking cessation-down to 1/2 pack/day. Update- [name] continues to work on smoking cessation, down to 6 cig/day.She was recently treated for bronchitis and is feeling better(reports she has bronchitis frequently).  A referral for Pulmonology in [place] was placed by PCP-patient to call and schedule new appointment. Patient rescheduled her neurology appointment to June. A new GI referral has been placed-patient to call and schedule appointment. She would like for all appointments to be after June 3 due to  school will be out and she will not have to be responsible for child care for her grandchildren.-Update- Patient was hospitalized in May for CHF and pnuemonia. She reports not smoking since her admission. She is feeling better, but has developed a bedsore. [place] Nurse is trying to extend visits. [name] has a swallow study scheduled 6/23. 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 -scheduled 6/23 patient will meet with RN Care Manager to address plan for sleep study to acquire new CPAP-Patient would like an in home sleep study and plans to inquire at her next neurology appointment patient will attend all scheduled medical appointments patient will work with CM team pharmacist to review medications-Met patient will work with Care Guide to address community resources/food insecurities-Met Interventions:  Inter-disciplinary care team collaboration (see longitudinal plan of care) Discussed wound and the importance of changing positions, provided different ways to change her position, encouraged a healthy diet and increasing protein intake Congratulated patient for not smoking in almost a month, encouraged her to continue Collaborate with MM Pharmacist regarding headaches after taking morning medications. Will see if he can reach out to her before next appointment on 7/11 Reviewed scheduled/upcoming provider appointments  Patient Goals/Self-Care Activities Over the next 30 days, patient will: - call [phone] for transportation to medical appointments. - begin a notebook of services in my neighborhood or community - call 211 when I need some  help - follow-up on any referrals for help I am given, call to schedule an appointment for ENT [phone],  Pulmonary [phone] and GI [phone] - make a list of family or friends that I can call  - develop a new routine to improve sleep - eat healthy - get at least 7 to 8 hours of sleep at night - keep room cool and dark - practice relaxation or meditation daily   Follow Up Plan: Telephone follow up appointment with Managed Medicaid care management team member scheduled for:02/12/21 @ 12:30pm        Patient Care Plan: MM Disease Management     Problem Identified: Health Promotion or Disease Self-Management (General Plan of Care)      Long-Range Goal: MM Disease Management   Start Date: 08/21/2020  Expected End Date: 10/18/2020  This Visit's Progress: On track  Priority: Medium  Note:   Current Barriers:  Patient has difficulty swallowing Patient struggles with food Has stamps but they run out by end of month Smoking cessation  Pharmacist Clinical Goal(s):  Over the next 90 days, patient will  Contact Smoking Cessation hotline through [place] from phone number  through collaboration with PharmD and provider.    Interventions: Inter-disciplinary care team collaboration (see longitudinal plan of care) Comprehensive medication review performed; medication list updated in electronic medical record  _0 @ _1 @  Patient Goals/Self-Care Activities Over the next 90 days, patient will:  -  Work on Smoking Cessation  Follow Up Plan: The care management team will reach out to the patient again over the next 90 days.

## 2021-01-28 NOTE — Telephone Encounter (Signed)
Form faxed to [facility] [employer] and a copy was made for batch scanning.

## 2021-01-29 NOTE — Progress Notes (Signed)
Subjective:   Patient ID: [name]    DOB: Aug 21, 1955, 65 y.o. female   MRN: [id number]  [name] is a 65 y.o. female with a history of HTN, migraines, diastolic CHF, OSA, hiatal hernia, chronic respiratory failure, COPD, h/o aspiration pneumonia, allergic rhinitis, IBS, GERD, dysphagia, seizure disorder, radiulopathy of UE, BPPV, osteopenia, DJD, weakness, Vit D def, tobacco use disorder, HLD, glaucoma, dizziness, dehydration, claudication, anxiety here for HTN and headache  Acute Concerns: 1. Left knee: Patient notes acutely worsening left knee pain x2 weeks.  She notes that she has chronic history of arthritis and has been recommended surgery but has not had this done before and denies any new trauma.  Denies any redness, warmth, fevers, chills.  She walks with a walker at baseline.  It hurts a lot to put any pressure on it.  She notes that she is currently undergoing physical therapy but feels like she has not seen much improvement due to the pain.  2. Sore on left buttocks: Has had a sore within gluteal crease x2 or 3 weeks.  She has been applying Desitin for 1-1/2 weeks.  She notes that she does sit a lot and tries to sit on a pillow but has had no improvement she does have a home health aide.  Denies any fevers, chills.  Review of Systems:  Per HPI.   Objective:   BP 116/78   Pulse 96   SpO2 100% Comment: 4L of oxygen Vitals and nursing note reviewed.  General: pleasant older woman, sitting comfortably wheel chair with [place] in place, well nourished, well developed, in no acute distress with non-toxic appearance Resp: breathing comfortably home oxygen, speaking in full sentences Skin: erythematous area in gluteal crease with two stage two ulcers on each inner glute roughly 2cm x 1.5cm Extremities: warm and well perfused MSK:  Left Knee: - Inspection: no gross deformity. Minimal effusion, no erythema or bruising. Skin intact - Palpation: TTP along medial>lateral joint line and  inferior patella  - ROM: full active ROM with flexion and extension in knee and hip - Strength: 5/5 strength - Neuro/vasc: NV intact  Right Knee: well healed vertical scar, no erythema, swelling, or warmth Neuro: Alert and oriented, speech normal   Prior Imaging: Left Knee: IMPRESSION: Tricompartmental osteoarthritis.  Diffuse soft tissue swelling.     Procedure performed: knee intraarticular corticosteroid injection; palpation guided Consent obtained and verified. Time-out conducted. Noted no overlying erythema, induration, or other signs of local infection. The left lateral joint space was palpated and marked. The overlying skin was prepped in a sterile fashion. Topical analgesic spray: Ethyl chloride. Joint: left knee  Needle: 1.5cm 25G needle Completed without difficulty. Meds: methylprednisolone (Depo-Medrol, 80 mg per mL), 1 mL, mixed with 4 mL of 1% lidocaine   Assessment & Plan:   Chronic pain of left knee Acute on chronic. Known tricompartmental arthritis of left knee. Evidence of knee replacement on right. No significant effusion and no signs of infection. Corticosteroid injection offered and provided today. Continue PT. OTC tylenol PRN and topical analgesics.   Pressure injury of ankle, stage 2 (HCC) Acute. No signs of acute infection. Discussed wound care including keeping wound clean, applying nonstick pad with vaseline between gluteal crease with intent to keep skin surfaces moist but off of each other. Did not appear too moist but could consider alginate dressing in future. Recommended frequent repositioning and donut pillow. Home health wound care order placed.   Orders Placed This Encounter  Procedures   Ambulatory referral to Home Health    Referral Priority:   Routine    Referral Type:   Home Health Care    Referral Reason:   Specialty Services Required    Requested Specialty:   [place]    Number of Visits Requested:   1    Meds ordered  this encounter  Medications   DISCONTD: methylPREDNISolone acetate (DEPO-MEDROL) injection 80 mg   methylPREDNISolone acetate (DEPO-MEDROL) injection 80 mg      Mina Marble, DO PGY-3, [place] [facility] 01/31/2021 1:14 PM

## 2021-01-30 ENCOUNTER — Ambulatory Visit (HOSPITAL_COMMUNITY): Payer: Medicaid Other

## 2021-01-30 ENCOUNTER — Encounter (HOSPITAL_COMMUNITY): Payer: Medicaid Other

## 2021-01-31 ENCOUNTER — Encounter: Payer: Self-pay | Admitting: Family Medicine

## 2021-01-31 ENCOUNTER — Ambulatory Visit (HOSPITAL_COMMUNITY): Payer: Medicaid Other

## 2021-01-31 ENCOUNTER — Ambulatory Visit (INDEPENDENT_AMBULATORY_CARE_PROVIDER_SITE_OTHER): Payer: Medicaid Other | Admitting: Family Medicine

## 2021-01-31 ENCOUNTER — Other Ambulatory Visit: Payer: Self-pay

## 2021-01-31 ENCOUNTER — Ambulatory Visit (HOSPITAL_COMMUNITY)
Admission: RE | Admit: 2021-01-31 | Discharge: 2021-01-31 | Disposition: A | Discharge status: Home / Self Care | Payer: Medicaid Other | Source: Ambulatory Visit | Attending: Physician Assistant | Admitting: Physician Assistant

## 2021-01-31 VITALS — BP 116/78 | HR 96

## 2021-01-31 DIAGNOSIS — G8929 Other chronic pain: Secondary | ICD-10-CM | POA: Diagnosis not present

## 2021-01-31 DIAGNOSIS — R1310 Dysphagia, unspecified: Secondary | ICD-10-CM

## 2021-01-31 DIAGNOSIS — M25562 Pain in left knee: Secondary | ICD-10-CM

## 2021-01-31 DIAGNOSIS — L89502 Pressure ulcer of unspecified ankle, stage 2: Secondary | ICD-10-CM

## 2021-01-31 MED ORDER — METHYLPREDNISOLONE ACETATE 80 MG/ML IJ SUSP: 80.0000 mg | Freq: Once | INTRAMUSCULAR | Status: DC

## 2021-01-31 MED ORDER — METHYLPREDNISOLONE ACETATE 80 MG/ML IJ SUSP
80.0000 mg | Freq: Once | INTRAMUSCULAR | Status: AC
Start: 2021-01-31 — End: 2021-01-31
  Administered 2021-01-31: 80 mg via INTRA_ARTICULAR

## 2021-01-31 NOTE — Assessment & Plan Note (Signed)
Acute on chronic. Known tricompartmental arthritis of left knee. Evidence of knee replacement on right. No significant effusion and no signs of infection. Corticosteroid injection offered and provided today. Continue PT. OTC tylenol PRN and topical analgesics.

## 2021-01-31 NOTE — Patient Instructions (Addendum)
      Please apply vaseline and a gauze to the area every day. Keep area clean Try to avoid excessive pressure and change positions every hour Obtain a donut cushion   Knee Injection Aftercare:  You received a corticosteroid injection into your knee.  You may experience worsening pain in the first 24-48 hours but then the pain should improve. You can treat pain with Tylenol 500mg  every 6 hours as needed Capsaicin, aspercreme, or biofreeze topically up to four times a day may also help with pain. Some supplements that may help for arthritis: Boswellia extract, curcumin, pycnogenol Voltaren gel or pennsaid topically 4 times a day.  It's important that you continue to stay active.  Perform th following exercise: Straight leg raises, knee extensions 3 sets of 10 once a day (add ankle weight if these become too easy).  Consider physical therapy to strengthen muscles around the joint that hurts to take pressure off of the joint itself. Shoe inserts with good arch support may be helpful.  You can get these at good running stores like [employer] or [employer]. Heat or ice 15 minutes at a time 3-4 times a day as needed to help with pain. Water aerobics and cycling with low resistance are the best two types of exercise for arthritis though any exercise is ok as long as it doesn't worsen the pain.  Follow up if you develop redness, swelling, worsening pain after 48 hours, or fevers/chills.

## 2021-01-31 NOTE — Assessment & Plan Note (Signed)
Acute. No signs of acute infection. Discussed wound care including keeping wound clean, applying nonstick pad with vaseline between gluteal crease with intent to keep skin surfaces moist but off of each other. Did not appear too moist but could consider alginate dressing in future. Recommended frequent repositioning and donut pillow. Home health wound care order placed.

## 2021-02-05 DIAGNOSIS — R0902 Hypoxemia: Secondary | ICD-10-CM | POA: Diagnosis not present

## 2021-02-05 DIAGNOSIS — J9611 Chronic respiratory failure with hypoxia: Secondary | ICD-10-CM | POA: Diagnosis not present

## 2021-02-05 DIAGNOSIS — J441 Chronic obstructive pulmonary disease with (acute) exacerbation: Secondary | ICD-10-CM | POA: Diagnosis not present

## 2021-02-05 DIAGNOSIS — J449 Chronic obstructive pulmonary disease, unspecified: Secondary | ICD-10-CM | POA: Diagnosis not present

## 2021-02-07 ENCOUNTER — Other Ambulatory Visit: Payer: Self-pay | Admitting: Family Medicine

## 2021-02-07 ENCOUNTER — Other Ambulatory Visit: Payer: Self-pay | Admitting: Neurology

## 2021-02-07 DIAGNOSIS — G8929 Other chronic pain: Secondary | ICD-10-CM

## 2021-02-12 ENCOUNTER — Other Ambulatory Visit: Payer: Self-pay | Admitting: Neurology

## 2021-02-12 ENCOUNTER — Other Ambulatory Visit: Payer: Self-pay | Admitting: *Deleted

## 2021-02-12 ENCOUNTER — Ambulatory Visit (HOSPITAL_COMMUNITY)
Admission: RE | Admit: 2021-02-12 | Discharge: 2021-02-12 | Disposition: A | Discharge status: Home / Self Care | Payer: Medicaid Other | Source: Ambulatory Visit | Attending: Physician Assistant | Admitting: Physician Assistant

## 2021-02-12 ENCOUNTER — Other Ambulatory Visit: Payer: Self-pay

## 2021-02-12 DIAGNOSIS — R1310 Dysphagia, unspecified: Secondary | ICD-10-CM | POA: Insufficient documentation

## 2021-02-12 NOTE — Patient Outreach (Addendum)
Medicaid Managed Care   Nurse Care Manager Note  02/12/2021 Name:  [name] MRN:  [id number] DOB:  11/22/1955  [name] is an 65 y.o. year old female who is a primary patient of [name], [name], MD.  The [facility] Managed Care Coordination team was consulted for assistance with:    HTN COPD  [name] was given information about [employer] team services today. [place] agreed to services and verbal consent obtained.  Engaged with patient by telephone for follow up visit in response to provider referral for case management and/or care coordination services.   Assessments/Interventions:  Review of past medical history, allergies, medications, health status, including review of consultants reports, laboratory and other test data, was performed as part of comprehensive evaluation and provision of chronic care management services.  SDOH (Social Determinants of Health) assessments and interventions performed: SDOH Interventions    Flowsheet Row Most Recent Value  SDOH Interventions   Food Insecurity Interventions Intervention Not Indicated  Housing Interventions Intervention Not Indicated  Transportation Interventions Other (Comment)  [Provided information for medical transportation [phone]       Care Plan  Allergies  Allergen Reactions   Amoxicillin Anaphylaxis    Breakout, mouth swells   Azithromycin Swelling and Rash    "swelling all over, high, thrush"   Cefdinir     Throat swelling after 3rd dose   Chantix [Varenicline] Other (See Comments)    "bad dreams, difficulty breathing"   Doxycycline Hyclate Anaphylaxis and Rash    swelling   Levofloxacin Anaphylaxis   Penicillins Anaphylaxis, Shortness Of Breath and Rash    "ALMOST DIED, ended up in hospital"  PCN reaction causing immediate rash, facial/tongue/throat swelling, SOB or lightheadedness with hypotension: YES PCN reaction causing severe rash involving mucus membranes or  skin necrosis: NO PCN reaction that required hospitalization YES Has patient had a PCN reaction occurring within the last 10 years: NO    Sulfonamide Derivatives Shortness Of Breath, Swelling and Rash    "break out from head to toe"   Toradol [Ketorolac Tromethamine] Other (See Comments)    Seizure    Clarithromycin Hives    swelling   Nortriptyline Swelling, Rash and Other (See Comments)    Per patient had "kidney problems" on this medication, could not use bathroom (urinary retention)   Codeine Nausea And Vomiting   Fluticasone-Salmeterol Other (See Comments)    REACTION: ulcers in mouth   Triptans Rash    Mouth breaks out and start itching    Medications Reviewed Today     Reviewed by [name], PA (Physician Assistant) on 01/22/21 at 1043  Med List Status: <None>   Medication Order Taking? Sig Documenting Provider Last Dose Status Informant  acetaminophen (TYLENOL) 500 MG tablet [id number] Yes Take 1,000 mg by mouth every other day. [provider] Taking Active Self  albuterol (PROAIR HFA) 108 (90 Base) MCG/ACT inhaler [id number] Yes INHALE TWO PUFFS INTO THE LUNGS EVERY 6 HOURS AS NEEDED FOR SHORTNESS OF BREATH [name], [name], MD Taking Active   albuterol (PROVENTIL) (2.5 MG/3ML) 0.083% nebulizer solution [id number] Yes USE 1 VIAL IN NEBULIZER EVERY 6 HOURS AS NEEDED FOR SHORTNESS OF BREATH [name], [name], MD Taking Active   ALLERGY RELIEF 10 MG tablet [id number] Yes TAKE ONE TABLET BY MOUTH DAILY [name], [name], MD Taking Active   aspirin EC 81 MG tablet [id number] Yes Take 81 mg by mouth daily. Swallow whole. [provider] Taking Active   Cholecalciferol (VITAMIN  D3) 50 MCG (2000 UT) capsule [id number] Yes TAKE ONE CAPSULE BY MOUTH DAILY [name], [name], MD Taking Active   DULERA 200-5 MCG/ACT AERO [id number] Yes INHALE TWO PUFFS INTO THE LUNGS TWICE DAILY [name], [name], MD Taking Active   ferrous  sulfate 324 (65 Fe) MG TBEC [id number] Yes Take 1 tablet (324 mg total) by mouth every other day. [name], [name], MD Taking Active   gabapentin (NEURONTIN) 100 MG capsule [id number] Yes Take 1 capsule (100 mg total) by mouth at bedtime. [name], [name], MD Taking Active            Med Note Bonnita Levan Nov 19, 2020 11:10 AM)    lamoTRIgine (LAMICTAL) 200 MG tablet [id number] Yes Take 1.5 tablets (300 mg total) by mouth 2 (two) times daily. [name], NP Taking Active   omeprazole (PRILOSEC) 40 MG capsule [id number] Yes TAKE ONE CAPSULE BY MOUTH TWICE DAILY [name], [name], MD Taking Active   OXYGEN [id number] Yes Inhale 4 L into the lungs continuous. [provider] Taking Active   pantoprazole (PROTONIX) 40 MG tablet [id number] Yes Take 1 tablet (40 mg total) by mouth 2 (two) times daily. [name], [place]  Active   polyethylene glycol powder [facility]) powder [id number] Yes MIX 17 GMS IN LIQUID AND DRINK ONCE DAILY [name], MD Taking Active   QUEtiapine (SEROQUEL) 100 MG tablet [id number] Yes Take 1 tablet (100 mg total) by mouth at bedtime. [name], NP Taking Active   sertraline (ZOLOFT) 50 MG tablet [id number] Yes Take 1 tablet (50 mg total) by mouth daily. [name], [name], MD Taking Active   simvastatin (ZOCOR) 40 MG tablet [id number] Yes Take 1 tablet (40 mg total) by mouth at bedtime. [name], [name], MD Taking Active   topiramate (TOPAMAX) 100 MG tablet [id number] Yes TAKE ONE TABLET BY MOUTH EVERY MORNING AND 2 TABLETS AT BEDTIME [name], NP Taking Active             Patient Active Problem List   Diagnosis Date Noted   Pressure injury of ankle, stage 2 ([place]) 01/31/2021   Dehydration 01/18/2021   Dysphagia 12/20/2020   Radiculopathy affecting upper extremity 11/12/2020   Encounter for wheelchair assessment 05/16/2020   Shortness of breath 07/26/2019   Chronic pain of  left knee 12/06/2018   Tobacco use disorder 07/02/2018   Elevated serum creatinine 06/01/2018   Weakness 05/15/2017   Chronic bronchitis ([place]) 01/14/2017   Claudication ([place]) 04/09/2016   Seizure disorder ([place]) 11/12/2015   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 05/23/2015   Chronic obstructive pulmonary disease ([place]) 04/25/2015   Dizziness 10/24/2014   Pulmonary hypertension ([place]) 61/60/7371   Diastolic CHF ([place]) 02/04/9484   BPPV (benign paroxysmal positional vertigo) 03/09/2013   Chronic respiratory failure ([place]) 11/01/2012   Left shoulder pain 06/27/2012   Headache 05/09/2012   Aspiration pneumonia ([place]) 04/26/2012   ANXIETY 02/13/2010   Obstructive sleep apnea 02/19/2009   Vitamin D deficiency 01/21/2008   OSTEOPENIA 11/24/2007   Irritable bowel syndrome 11/17/2007   Allergic rhinitis 09/24/2007   Hyperlipidemia 10/08/2006   OBESITY, NOS  BMI 33.8 10/08/2006   GLAUCOMA 10/08/2006   GASTROESOPHAGEAL REFLUX, NO ESOPHAGITIS 10/08/2006   HERNIA, HIATAL, NONCONGENITAL 10/08/2006   INCONTINENCE, STRESS, FEMALE 10/08/2006   DJD (degenerative joint disease) of knee 10/08/2006    Conditions to be addressed/monitored per PCP order:  HTN and COPD  Care Plan : COPD (Adult)  Updates made by Lurena Joiner  A, RN since 02/12/2021 12:00 AM     Problem: Disease Progression (COPD)      Long-Range Goal: Disease Progression Minimized or Managed   Start Date: 08/17/2020  Expected End Date: 04/29/2021  Recent Progress: On track  Priority: Medium  Note:   Current Barriers:  Chronic Disease Management support and education needs related to COPD-Ms. Puder reports difficulty swallowing solids and liquids, missed her appointment for evaluation due to lack of transportation. She is not sleeping consistently at night-lost her CPAP during a recent move. She does not want to go into the hospital for a sleep study due to fear of Covid. She has not been vaccinated due to her  many allergies. She is working on smoking cessation-down to 1/2 pack/day. Update- Ms. Contino continues to work on smoking cessation, down to 6 cig/day.She was recently treated for bronchitis and is feeling better(reports she has bronchitis frequently).  A referral for Pulmonology in [place] was placed by PCP-patient to call and schedule new appointment. Patient rescheduled her neurology appointment to June. A new GI referral has been placed-patient to call and schedule appointment. She would like for all appointments to be after June 3 due to school will be out and she will not have to be responsible for child care for her grandchildren. Patient was hospitalized in May for CHF and pnuemonia. She reports not smoking since her admission. She is feeling better, but has developed a bedsore. [place] Nurse is trying to extend visits. Ms. Vanduyn has a swallow study scheduled 6/23. She reports a headache everyday after taking her morning medications.Update-Ms. Vestal will be changing PCP providers, due to gas prices and needing care closer to home. She will be attending an appointment on 7/11 at [facility] in [place]. She continues to not smoke and is feeling better. 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  Transportation barriers Food Insecurities-Has food stamps, but tends to run out of food before the end of the month Non-adherence to scheduled provider appointments-due to transportation barriers Nurse Case Manager Clinical Goal(s):  patient will verbalize understanding of plan for swallowing evaluation. -scheduled 6/23 patient will meet with RN Care Manager to address plan for sleep study to acquire new CPAP-Patient would like an in home sleep study and plans to inquire at her next neurology appointment patient will attend all scheduled medical appointments patient will work with CM team pharmacist to review medications-Met patient will work with Care Guide to address community  resources/food insecurities-Met Interventions:  Inter-disciplinary care team collaboration (see longitudinal plan of care) Discussed wound and the importance of changing positions, provided different ways to change her position, encouraged a healthy diet and increasing protein intake Congratulated patient for not smoking in almost a month, encouraged her to continue Reviewed scheduled/upcoming provider appointments Reviewed and discussed swallow study Discussed the importance of changing positions and positioning during the night  Patient Goals/Self-Care Activities Over the next 30 days, patient will: - call [phone] for transportation to medical appointments. - begin a notebook of services in my neighborhood or community - call 211 when I need some help - follow-up on any referrals for help I am given, call to schedule an appointment for ENT [phone],  Pulmonary [phone] and GI [phone] - make a list of family or friends that I can call  - develop a new routine to improve sleep - eat healthy - get at least 7 to 8 hours of sleep at night - keep room cool and dark - practice  relaxation or meditation daily Follow Up Plan: Telephone follow up appointment with Managed Medicaid care management team member scheduled for:03/18/21 @ 1:00pm         Follow Up:  Patient agrees to Care Plan and Follow-up.  Plan: The Managed Medicaid care management team will reach out to the patient again over the next 30 days.  Date/time of next scheduled RN care management/care coordination outreach:  03/18/21 @ 1pm   Lurena Joiner RN, [place] RN Care Coordinator

## 2021-02-12 NOTE — Patient Instructions (Addendum)
Visit Information  [name] was given information about Medicaid Managed Care team care coordination services as a part of their [place] Medicaid benefit. [name] verbally consented to engagement with the [facility] Managed Care team.   For questions related to your [facility], please call: [phone] or visit the homepage here: [email or web address]  If you would like to schedule transportation through your [facility], please call the following number at least 2 days in advance of your appointment: [phone].   Call the [place] at [phone], at any time, 24 hours a day, 7 days a week. If you are in danger or need immediate medical attention call 911.  If you would like help to quit smoking, call 1-800-QUIT-NOW 236 364 9162) OR Espaol: 1-855-Djelo-Ya (5-929-244-6286) o para ms informacin haga clic aqu or Text READY to 200-400 to register via text  Ms. [place] - following are the goals we discussed in your visit today:   Goals Addressed             This Visit's Progress    Find Help in My Community       Timeframe:  Long-Range Goal Priority:  Medium Start Date:   08/17/20                          Expected End Date:  04/29/21                     Follow up 03/18/21   - call [phone] for transportation to medical appointments. - begin a notebook of services in my neighborhood or community - call 211 when I need some help - follow-up on any referrals for help I am given, call to schedule an appointment for ENT [phone]  Pulmonary [phone] and GI [phone] - make a list of family or friends that I can call    Why is this important?   Knowing how and where to find help for yourself or family in your neighborhood and community is an important skill.  You will want to take some steps to learn how.            Manage Fatigue (Tiredness-COPD)       Follow up 03/18/21   - develop a new routine to improve sleep - eat healthy - get at least 7 to 8 hours of sleep at night - keep room cool and dark - practice relaxation or meditation daily    Why is this important?   Feeling tired or worn out is a common symptom of COPD (chronic obstructive pulmonary disease).  Learning when you feel your best and when you need rest is important.  Managing the tiredness (fatigue) will help you be active and enjoy life.              Please see education materials related to COPD provided by MyChart link.  Patient verbalizes understanding of instructions provided today.   Telephone follow up appointment with Managed Medicaid care management team member scheduled for:03/18/21 @ [place] RN, BSN [place] RN Care Coordinator   Following is a copy of your plan of care:  Patient Care Plan: COPD (Adult)     Problem Identified: Disease Progression (COPD)      Long-Range Goal: Disease Progression Minimized or Managed   Start Date: 08/17/2020  Expected End Date: 04/29/2021  Recent Progress: On  track  Priority: Medium  Note:   Current Barriers:  Chronic Disease Management support and education needs related to COPD-[name] reports difficulty swallowing solids and liquids, missed her appointment for evaluation due to lack of transportation. She is not sleeping consistently at night-lost her CPAP during a recent move. She does not want to go into the hospital for a sleep study due to fear of Covid. She has not been vaccinated due to her many allergies. She is working on smoking cessation-down to 1/2 pack/day. Update- [name] continues to work on smoking cessation, down to 6 cig/day.She was recently treated for bronchitis and is feeling better(reports she has bronchitis frequently).  A referral for Pulmonology in [place] was placed by PCP-patient to call and schedule new  appointment. Patient rescheduled her neurology appointment to June. A new GI referral has been placed-patient to call and schedule appointment. She would like for all appointments to be after June 3 due to school will be out and she will not have to be responsible for child care for her grandchildren. Patient was hospitalized in May for CHF and pnuemonia. She reports not smoking since her admission. She is feeling better, but has developed a bedsore. [place] Nurse is trying to extend visits. [name] has a swallow study scheduled 6/23. She reports a headache everyday after taking her morning medications.Update-[name] will be changing PCP providers, due to gas prices and needing care closer to home. She will be attending an appointment on 7/11 at [facility] in [place]. She continues to not smoke and is feeling better. 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  Transportation barriers Food Insecurities-Has food stamps, but tends to run out of food before the end of the month Non-adherence to scheduled provider appointments-due to transportation barriers Nurse Case Manager Clinical Goal(s):  patient will verbalize understanding of plan for swallowing evaluation. -scheduled 6/23 patient will meet with RN Care Manager to address plan for sleep study to acquire new CPAP-Patient would like an in home sleep study and plans to inquire at her next neurology appointment patient will attend all scheduled medical appointments patient will work with CM team pharmacist to review medications-Met patient will work with Care Guide to address community resources/food insecurities-Met Interventions:  Inter-disciplinary care team collaboration (see longitudinal plan of care) Discussed wound and the importance of changing positions, provided different ways to change her position, encouraged a healthy diet and increasing protein intake Congratulated patient for not smoking in almost a month,  encouraged her to continue Reviewed scheduled/upcoming provider appointments Reviewed and discussed swallow study Discussed the importance of changing positions and positioning during the night   Patient Goals/Self-Care Activities Over the next 30 days, patient will: - call [phone] for transportation to medical appointments. - begin a notebook of services in my neighborhood or community - call 211 when I need some help - follow-up on any referrals for help I am given, call to schedule an appointment for ENT [phone],  Pulmonary [phone] and GI [phone] - make a list of family or friends that I can call  - develop a new routine to improve sleep - eat healthy - get at least 7 to 8 hours of sleep at night - keep room cool and dark - practice relaxation or meditation daily   Follow Up Plan: Telephone follow up appointment with Managed Medicaid care management team member scheduled for:03/18/21 @ 1:00pm        Patient Care Plan: MM Disease Management  Problem Identified: Health Promotion or Disease Self-Management (General Plan of Care)      Long-Range Goal: MM Disease Management   Start Date: 08/21/2020  Expected End Date: 10/18/2020  This Visit's Progress: On track  Priority: Medium  Note:   Current Barriers:  Patient has difficulty swallowing Patient struggles with food Has stamps but they run out by end of month Smoking cessation  Pharmacist Clinical Goal(s):  Over the next 90 days, patient will  Contact Smoking Cessation hotline through [place] from phone number  through collaboration with PharmD and provider.    Interventions: Inter-disciplinary care team collaboration (see longitudinal plan of care) Comprehensive medication review performed; medication list updated in electronic medical record  _0 @ _1 @  Patient Goals/Self-Care Activities Over the next 90 days, patient will:  -  Work on Smoking Cessation  Follow Up Plan:  The care management team will reach out to the patient again over the next 90 days.

## 2021-02-12 NOTE — Progress Notes (Signed)
Modified Barium Swallow Progress Note  Patient Details  Name: [name] MRN: [id number] Date of Birth: 06/07/1956  Today's Date: 02/12/2021  Modified Barium Swallow completed.  Full report located under Chart Review in the Imaging Section.  Brief recommendations include the following:  Clinical Impression  Pt presents with similar oropahryngeal swallowing, if not somewhat improved, compared to MBS last year. She continues to have penetration of thin liquids as a result of impaired timing, but this does not happen consistently and is often cleared spontaneously (PAS 2). She had two instances of trace penetration that did not clear and reached her true vocal folds, but when cued to clear her throat, this expelled the barium from her laryngeal vestibule - a strategy that was not effective in the past. Note that the barium tablet did clear the esophagus, although it required a little extra time and liquid to do so (MD not present to confirm). Recommend that pt continue with regular solids and thin liquids as tolerated with use of an intermittent throat clear as an added precautions. Would defer any additional f/u at this time to GI given subjective symptoms of solids getting stuck.   Swallow Evaluation Recommendations   Recommended Consults: Consider GI evaluation   SLP Diet Recommendations: Regular solids;Thin liquid   Liquid Administration via: Cup;Straw   Medication Administration: Whole meds with liquid   Supervision: Patient able to self feed   Compensations: Slow rate;Small sips/bites;Clear throat intermittently   Postural Changes: Seated upright at 90 degrees;Remain semi-upright after after feeds/meals (Comment)   Oral Care Recommendations: Oral care BID        Osie Bond., M.A. [place] Pager [phone] Office [phone]  02/12/2021,1:31 PM

## 2021-02-15 ENCOUNTER — Other Ambulatory Visit: Payer: Self-pay

## 2021-02-15 DIAGNOSIS — R1310 Dysphagia, unspecified: Secondary | ICD-10-CM

## 2021-02-18 ENCOUNTER — Other Ambulatory Visit: Payer: Self-pay | Admitting: Family Medicine

## 2021-02-18 ENCOUNTER — Telehealth: Payer: Self-pay

## 2021-02-18 ENCOUNTER — Other Ambulatory Visit: Payer: Self-pay

## 2021-02-18 DIAGNOSIS — G8929 Other chronic pain: Secondary | ICD-10-CM

## 2021-02-18 NOTE — Patient Outreach (Signed)
Medicaid Managed Care    Pharmacy Note  02/18/2021 Name: MILYNN QUIRION MRN: [id number] DOB: 03-22-1956  EMBERLIN VERNER is a 65 y.o. year old female who is a primary care patient of [name], [name], MD. The [facility] Managed Care Coordination team was consulted for assistance with disease management and care coordination needs.    Engaged with patient Engaged with patient by telephone for initial visit in response to referral for case management and/or care coordination services.  Ms. Korte was given information about Managed Medicaid Care Coordination team services today. [place] agreed to services and verbal consent obtained.   Objective:  Lab Results  Component Value Date   CREATININE 1.02 (H) 04/10/2020   CREATININE 1.02 (H) 04/05/2019   CREATININE 1.09 (H) 06/01/2018    Lab Results  Component Value Date   HGBA1C 5.5 05/16/2020       Component Value Date/Time   CHOL 148 06/19/2014 1656   TRIG 176 (H) 06/19/2014 1656   HDL 52 06/19/2014 1656   CHOLHDL 2.8 06/19/2014 1656   VLDL 35 06/19/2014 1656   LDLCALC 61 06/19/2014 1656   LDLDIRECT 77 09/14/2012 1618    Other: (TSH, CBC, Vit D, etc.)  Clinical ASCVD: No  The ASCVD Risk score Mikey Bussing DC Jr., et al., 2013) failed to calculate for the following reasons:   Cannot find a previous HDL lab   Cannot find a previous total cholesterol lab    Other: (CHADS2VASc if Afib, PHQ9 if depression, MMRC or CAT for COPD, ACT, DEXA)  BP Readings from Last 3 Encounters:  01/31/21 116/78  01/22/21 (!) 92/58  01/18/21 (!) 84/50    Assessment/Interventions: Review of patient past medical history, allergies, medications, health status, including review of consultants reports, laboratory and other test data, was performed as part of comprehensive evaluation and provision of chronic care management services.   Covid Vaccination Has allergies and hasn't gotten Covid vaccine due to fears Jan 2022 Plan: Spoke a lot about , patient  will try to get shot at Hospital in case a reaction occurs April 2022: Re-emphasized importance of getting vaccinated, patient still waiting July 2022: Patient was not responsive to talking about this today  Smoking 1PPD -Likes to smoke because wants time to herself Patch gum didn't work -Can't take pill due to seizures Jan 2022 Plan: Will call [place] Hotline and work on cessation April 2022: Patient still hesitant to quit, re-gave her the number. States her biggest fear is getting cancer or getting Covid or death from the flu. Offered support but never was forceful about it July 2022: Patient was not responsive to talking about this today  Migraines Aimovig Topiramate 100mg  1AM and 2HS Plan: At goal,  patient stable/ symptoms controlled   Seizures Lamotrigine 200mg  take 1.5tabs BID Plan: At goal,  patient stable/ symptoms controlled    Mood: Depression screen [facility] 2/9 01/31/2021 01/18/2021 01/18/2021  Decreased Interest 0 2 2  Down, Depressed, Hopeless 0 1 1  PHQ - 2 Score 0 3 3  Altered sleeping 1 1 1   Tired, decreased energy 2 1 1   Change in appetite 0 0 0  Feeling bad or failure about yourself  0 3 3  Trouble concentrating 0 3 3  Moving slowly or fidgety/restless 2 3 3   Suicidal thoughts 0 0 3  PHQ-9 Score 5 14 17   Difficult doing work/chores - - -  Some recent data might be hidden   Sertraline 50mg  Plan: At goal,  patient stable/ symptoms  controlled    Insomnia Quetiapine 100mg  HS -Helps her go to sleep -Wakes up 2-3x/week 10PM to 3AM is her sleep schedule -Has sleep apnea  April 2022 Plan: Once she gets the covid vaccine she can get another sleep study done, this should help stress and all disease states July 2022: Patient sleeping 8 hours/night and is very happy! Got sleep study done and has CPAP machine  Cholesterol Lab Results  Component Value Date   CHOL 148 06/19/2014   CHOL 202 (H) 02/13/2010   CHOL 203 (H) 01/26/2008   Lab Results  Component Value  Date   HDL 52 06/19/2014   HDL 55 02/13/2010   HDL 47 01/26/2008   Lab Results  Component Value Date   LDLCALC 61 06/19/2014   LDLCALC 126 (H) 02/13/2010   LDLCALC 113 (H) 01/26/2008   Lab Results  Component Value Date   TRIG 176 (H) 06/19/2014   TRIG 104 02/13/2010   TRIG 215 (H) 01/26/2008   Lab Results  Component Value Date   CHOLHDL 2.8 06/19/2014   CHOLHDL 3.7 Ratio 02/13/2010   CHOLHDL 4.3 Ratio 01/26/2008   Lab Results  Component Value Date   LDLDIRECT 77 09/14/2012   LDLDIRECT 103 (H) 01/31/2011   LDLDIRECT 56 03/29/2008   Simvastatin 40mg  July 2022: Will ask PCP to change to Atorvastatin  Pain 08/21/20: Without meds:10      With Meloxicam: 8 Meloxicam (Doesn't help much but does a little) July 2022: Patient states pain is, "Going well" but wouldn't rate it numerically      SDOH (Social Determinants of Health) assessments and interventions performed:    Care Plan  Allergies  Allergen Reactions   Amoxicillin Anaphylaxis    Breakout, mouth swells   Azithromycin Swelling and Rash    "swelling all over, high, thrush"   Cefdinir     Throat swelling after 3rd dose   Chantix [Varenicline] Other (See Comments)    "bad dreams, difficulty breathing"   Doxycycline Hyclate Anaphylaxis and Rash    swelling   Levofloxacin Anaphylaxis   Penicillins Anaphylaxis, Shortness Of Breath and Rash    "ALMOST DIED, ended up in hospital"  PCN reaction causing immediate rash, facial/tongue/throat swelling, SOB or lightheadedness with hypotension: YES PCN reaction causing severe rash involving mucus membranes or skin necrosis: NO PCN reaction that required hospitalization YES Has patient had a PCN reaction occurring within the last 10 years: NO    Sulfonamide Derivatives Shortness Of Breath, Swelling and Rash    "break out from head to toe"   Toradol [Ketorolac Tromethamine] Other (See Comments)    Seizure    Clarithromycin Hives    swelling   Nortriptyline  Swelling, Rash and Other (See Comments)    Per patient had "kidney problems" on this medication, could not use bathroom (urinary retention)   Codeine Nausea And Vomiting   Fluticasone-Salmeterol Other (See Comments)    REACTION: ulcers in mouth   Triptans Rash    Mouth breaks out and start itching    Medications Reviewed Today     Reviewed by [name], PA (Physician Assistant) on 01/22/21 at 1043  Med List Status: <None>   Medication Order Taking? Sig Documenting Provider Last Dose Status Informant  acetaminophen (TYLENOL) 500 MG tablet [id number] Yes Take 1,000 mg by mouth every other day. [provider] Taking Active Self  albuterol (PROAIR HFA) 108 (90 Base) MCG/ACT inhaler [id number] Yes INHALE TWO PUFFS INTO THE LUNGS EVERY 6 HOURS AS NEEDED FOR  SHORTNESS OF BREATH [name], [name], MD Taking Active   albuterol (PROVENTIL) (2.5 MG/3ML) 0.083% nebulizer solution [id number] Yes USE 1 VIAL IN NEBULIZER EVERY 6 HOURS AS NEEDED FOR SHORTNESS OF BREATH [name], [name], MD Taking Active   ALLERGY RELIEF 10 MG tablet [id number] Yes TAKE ONE TABLET BY MOUTH DAILY [name], [name], MD Taking Active   aspirin EC 81 MG tablet [id number] Yes Take 81 mg by mouth daily. Swallow whole. [provider] Taking Active   Cholecalciferol (VITAMIN D3) 50 MCG (2000 UT) capsule [id number] Yes TAKE ONE CAPSULE BY MOUTH DAILY [name], [name], MD Taking Active   DULERA 200-5 MCG/ACT AERO [id number] Yes INHALE TWO PUFFS INTO THE LUNGS TWICE DAILY [name], [name], MD Taking Active   ferrous sulfate 324 (65 Fe) MG TBEC [id number] Yes Take 1 tablet (324 mg total) by mouth every other day. [name], [name], MD Taking Active   gabapentin (NEURONTIN) 100 MG capsule [id number] Yes Take 1 capsule (100 mg total) by mouth at bedtime. [name], [name], MD Taking Active            Med Note Bonnita Levan Nov 19, 2020 11:10 AM)     lamoTRIgine (LAMICTAL) 200 MG tablet [id number] Yes Take 1.5 tablets (300 mg total) by mouth 2 (two) times daily. [name], NP Taking Active   omeprazole (PRILOSEC) 40 MG capsule [id number] Yes TAKE ONE CAPSULE BY MOUTH TWICE DAILY [name], [name], MD Taking Active   OXYGEN [id number] Yes Inhale 4 L into the lungs continuous. [provider] Taking Active   pantoprazole (PROTONIX) 40 MG tablet [id number] Yes Take 1 tablet (40 mg total) by mouth 2 (two) times daily. [name], [place]  Active   polyethylene glycol powder [facility]) powder [id number] Yes MIX 17 GMS IN LIQUID AND DRINK ONCE DAILY [name], MD Taking Active   QUEtiapine (SEROQUEL) 100 MG tablet [id number] Yes Take 1 tablet (100 mg total) by mouth at bedtime. [name], NP Taking Active   sertraline (ZOLOFT) 50 MG tablet [id number] Yes Take 1 tablet (50 mg total) by mouth daily. [name], [name], MD Taking Active   simvastatin (ZOCOR) 40 MG tablet [id number] Yes Take 1 tablet (40 mg total) by mouth at bedtime. [name], [name], MD Taking Active   topiramate (TOPAMAX) 100 MG tablet [id number] Yes TAKE ONE TABLET BY MOUTH EVERY MORNING AND 2 TABLETS AT BEDTIME [name], NP Taking Active             Patient Active Problem List   Diagnosis Date Noted   Pressure injury of ankle, stage 2 ([place]) 01/31/2021   Dehydration 01/18/2021   Dysphagia 12/20/2020   Radiculopathy affecting upper extremity 11/12/2020   Encounter for wheelchair assessment 05/16/2020   Shortness of breath 07/26/2019   Chronic pain of left knee 12/06/2018   Tobacco use disorder 07/02/2018   Elevated serum creatinine 06/01/2018   Weakness 05/15/2017   Chronic bronchitis ([place]) 01/14/2017   Claudication ([place]) 04/09/2016   Seizure disorder ([place]) 11/12/2015   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 05/23/2015   Chronic obstructive pulmonary disease ([place])  04/25/2015   Dizziness 10/24/2014   Pulmonary hypertension ([place]) 98/33/8250   Diastolic CHF ([place]) 53/97/6734   BPPV (benign paroxysmal positional vertigo) 03/09/2013   Chronic respiratory failure ([place]) 11/01/2012   Left shoulder pain 06/27/2012   Headache 05/09/2012   Aspiration pneumonia ([place]) 04/26/2012   ANXIETY 02/13/2010   Obstructive sleep apnea 02/19/2009   Vitamin D deficiency 01/21/2008  OSTEOPENIA 11/24/2007   Irritable bowel syndrome 11/17/2007   Allergic rhinitis 09/24/2007   Hyperlipidemia 10/08/2006   OBESITY, NOS  BMI 33.8 10/08/2006   GLAUCOMA 10/08/2006   GASTROESOPHAGEAL REFLUX, NO ESOPHAGITIS 10/08/2006   HERNIA, HIATAL, NONCONGENITAL 10/08/2006   INCONTINENCE, STRESS, FEMALE 10/08/2006   DJD (degenerative joint disease) of knee 10/08/2006    Conditions to be addressed/monitored: COPD and Smoking Cessation  Patient Care Plan: COPD (Adult)     Problem Identified: Disease Progression (COPD)      Long-Range Goal: Disease Progression Minimized or Managed   Start Date: 08/17/2020  Expected End Date: 10/17/2020  This Visit's Progress: On track  Priority: Medium  Note:   Current Barriers:  Chronic Disease Management support and education needs related to COPD-Ms. Rhew reports difficulty swallowing solids and liquids, missed her appointment for evaluation due to lack of transportation. She is not sleeping consistently at night-lost her CPAP during a recent move. She does not want to go into the hospital for a sleep study due to fear of Covid. She has not been vaccinated due to her many allergies. 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 Encouraged patient to call and reschedule appointment today. Over the next 30 days, patient will meet with RN Care Manager to address plan for sleep study to acquire new CPAP Over the next 30 days, patient will attend all scheduled medical appointments: 08/29/20 with Pulmonology Over the next 30 days, patient will work with CM team pharmacist to review medications Over the next 30 days, patient will work with [place] to address community resources/food insecurities  Interventions:  Inter-disciplinary care team collaboration (see longitudinal plan of care) Advised patient to reschedule appointment for swallowing evaluation, discuss need for new CPAP at her appointment with Pulmonology, continue to work on cutting back on the number of cigarettes/day Reviewed medications with patient and discussed her needing refill for iron and her difficulty swallowing pills Reviewed scheduled/upcoming provider appointments including: 08/29/20 with Pulmonology and rescheduling swallow evaluation Care Guide referral for food insecurities Pharmacy referral for medication review/assistance Provided patient with the [facility] community transportation number [phone]  Patient Goals/Self-Care Activities Over the next 30 days, patient will:  -Self administers medications as prescribed - Attends all scheduled provider appointments - Calls pharmacy for medication refills - Calls provider office for new concerns or questions - develop a new routine to improve sleep - eat healthy - get at least 7 to 8 hours of sleep at night - keep room cool and dark - practice relaxation or meditation daily - begin a notebook of services in my neighborhood or community - call 211 when I need some help - follow-up on any referrals for help I am given - make a list of family or friends that I can call   Follow Up Plan: Telephone follow up appointment with Managed Medicaid care  management team member scheduled for:09/19/20 @ 11:15am        Patient Care Plan: MM Disease Management     Problem Identified: Health Promotion or Disease Self-Management (General Plan of Care)      Long-Range Goal: MM Disease Management   Start Date: 08/21/2020  Expected End Date: 10/18/2020  This  Visit's Progress: On track  Priority: Medium  Note:   Current Barriers:  Patient has difficulty swallowing Patient struggles with food Has stamps but they run out by end of month Smoking cessation  Pharmacist Clinical Goal(s):  Over the next 90 days, patient will  Contact Smoking Cessation hotline through [place] from phone number  through collaboration with PharmD and provider.    Interventions: Inter-disciplinary care team collaboration (see longitudinal plan of care) Comprehensive medication review performed; medication list updated in electronic medical record  @RXCPCOPD @ @RXCPMENTALHEALTH @  Patient Goals/Self-Care Activities Over the next 90 days, patient will:  -  Work on Smoking Cessation  Follow Up Plan: The care management team will reach out to the patient again over the next 90 days.       Task: Mutually Develop and Royce Macadamia Achievement of Patient Goals   Note:   Care Management Activities:        Notes:      Medication Assistance: None required. Patient affirms current coverage meets needs.   Follow up: Agree  Plan: The care management team will reach out to the patient again over the next 90 days.   [place] Constable, Pharm.D., Managed Medicaid Pharmacist - [phone]

## 2021-02-18 NOTE — Patient Instructions (Signed)
Visit Information  Ms. Valvano was given information about Medicaid Managed Care team care coordination services as a part of their [place] Medicaid benefit. Prue verbally consented to engagement with the [facility] Managed Care team.   For questions related to your [facility], please call: [phone] or visit the homepage here: [email or web address]  If you would like to schedule transportation through your [facility], please call the following number at least 2 days in advance of your appointment: [phone].   Call the [place] at [phone], at any time, 24 hours a day, 7 days a week. If you are in danger or need immediate medical attention call 911.  If you would like help to quit smoking, call 1-800-QUIT-NOW (980)039-6914) OR Espaol: 1-855-Djelo-Ya (5-035-465-6812) o para ms informacin haga clic aqu or Text READY to 200-400 to register via text  Ms. Achorn - following are the goals we discussed in your visit today:   Goals Addressed   None     Please see education materials related to Insomnia provided as print materials.   Patient verbalizes understanding of instructions provided today.   The patient has been provided with contact information for the Managed Medicaid care management team and has been advised to call with any health related questions or concerns.   [place] Constable, Pharm.D., Managed Medicaid Pharmacist [phone]   Following is a copy of your plan of care:  Patient Care Plan: COPD (Adult)     Problem Identified: Disease Progression (COPD)      Long-Range Goal: Disease Progression Minimized or Managed   Start Date: 08/17/2020  Expected End Date: 04/29/2021  Recent Progress: On track  Priority: Medium  Note:   Current Barriers:  Chronic Disease Management support and education needs related to  COPD-Ms. Floor reports difficulty swallowing solids and liquids, missed her appointment for evaluation due to lack of transportation. She is not sleeping consistently at night-lost her CPAP during a recent move. She does not want to go into the hospital for a sleep study due to fear of Covid. She has not been vaccinated due to her many allergies. She is working on smoking cessation-down to 1/2 pack/day. Update- Ms. Kuba continues to work on smoking cessation, down to 6 cig/day.She was recently treated for bronchitis and is feeling better(reports she has bronchitis frequently).  A referral for Pulmonology in [place] was placed by PCP-patient to call and schedule new appointment. Patient rescheduled her neurology appointment to June. A new GI referral has been placed-patient to call and schedule appointment. She would like for all appointments to be after June 3 due to school will be out and she will not have to be responsible for child care for her grandchildren. Patient was hospitalized in May for CHF and pnuemonia. She reports not smoking since her admission. She is feeling better, but has developed a bedsore. [place] Nurse is trying to extend visits. Ms. Kutner has a swallow study scheduled 6/23. She reports a headache everyday after taking her morning medications.Update-Ms. Mchatton will be changing PCP providers, due to gas prices and needing care closer to home. She will be attending an appointment on 7/11 at [facility] in [place]. She continues to not smoke and is feeling better. 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 -scheduled 6/23 patient will meet with RN Care Manager to  address plan for sleep study to acquire new CPAP-Patient would like an in home sleep study and plans to inquire at her next neurology appointment patient will attend all scheduled medical appointments patient will work with CM team pharmacist to review medications-Met patient will work with Care Guide to address community resources/food insecurities-Met Interventions:  Inter-disciplinary care team collaboration (see longitudinal plan of care) Discussed wound and the importance of changing positions, provided different ways to change her position, encouraged a healthy diet and increasing protein intake Congratulated patient for not smoking in almost a month, encouraged her to continue Reviewed scheduled/upcoming provider appointments Reviewed and discussed swallow study Discussed the importance of changing positions and positioning during the night  Patient Goals/Self-Care Activities Over the next 30 days, patient will: - call [phone] for transportation to medical appointments. - begin a notebook of services in my neighborhood or community - call 211 when I need some help - follow-up on any referrals for help I am given, call to schedule an appointment for ENT [phone],  Pulmonary [phone] and GI [phone] - make a list of family or friends that I can call  - develop a new routine to improve sleep - eat healthy - get at least 7 to 8 hours of sleep at night - keep room cool and dark - practice relaxation or meditation daily Follow Up Plan: Telephone follow up appointment with Managed Medicaid care management team member scheduled for:03/18/21 @ 1:00pm        Patient Care Plan: MM Disease Management     Problem Identified: Health Promotion or Disease Self-Management (General Plan of Care)      Long-Range Goal: MM Disease Management   Start Date: 08/21/2020  Expected End Date: 10/18/2020  This Visit's Progress: On track  Priority: Medium  Note:   Current Barriers:   Patient has difficulty swallowing Patient struggles with food Has stamps but they run out by end of month Smoking cessation  Pharmacist Clinical Goal(s):  Over the next 90 days, patient will  Contact Smoking Cessation hotline through [place] from phone number  through collaboration with PharmD and provider.    Interventions: Inter-disciplinary care team collaboration (see longitudinal plan of care) Comprehensive medication review performed; medication list updated in electronic medical record  _0 @ _1 @  Patient Goals/Self-Care Activities Over the next 90 days, patient will:  -  Work on Smoking Cessation  Follow Up Plan: The care management team will reach out to the patient again over the next 90 days.       Task: Mutually Develop and Royce Macadamia Achievement of Patient Goals   Note:   Care Management Activities:    - verbalization of feelings encouraged    Notes:

## 2021-02-18 NOTE — Telephone Encounter (Signed)
Submitted referral to my file in area for knee pain per patient request.

## 2021-02-18 NOTE — Telephone Encounter (Signed)
Patient calls nurse line requesting a referral to [facility] for her chronic knee pain. Will forward to PCP.

## 2021-02-20 ENCOUNTER — Telehealth: Payer: Self-pay | Admitting: Family Medicine

## 2021-02-20 NOTE — Telephone Encounter (Signed)
Reviewed form and placed in PCP's box for completion.  .Deangela Randleman R Arlethia Basso, CMA  

## 2021-02-20 NOTE — Telephone Encounter (Signed)
Patients husband dropped of a form that needs to be completed and faxed to [facility] power. Patients last date of service is 01/31/21.  I have placed form in Red team folder.

## 2021-02-25 NOTE — Telephone Encounter (Signed)
Form completed and placed into fax pile   Eulis Foster, MD

## 2021-03-04 ENCOUNTER — Other Ambulatory Visit: Payer: Self-pay | Admitting: Neurology

## 2021-03-07 DIAGNOSIS — J449 Chronic obstructive pulmonary disease, unspecified: Secondary | ICD-10-CM | POA: Diagnosis not present

## 2021-03-07 DIAGNOSIS — R0902 Hypoxemia: Secondary | ICD-10-CM | POA: Diagnosis not present

## 2021-03-07 DIAGNOSIS — J9611 Chronic respiratory failure with hypoxia: Secondary | ICD-10-CM | POA: Diagnosis not present

## 2021-03-07 DIAGNOSIS — J441 Chronic obstructive pulmonary disease with (acute) exacerbation: Secondary | ICD-10-CM | POA: Diagnosis not present

## 2021-03-08 ENCOUNTER — Other Ambulatory Visit (HOSPITAL_COMMUNITY): Payer: Medicaid Other

## 2021-03-08 ENCOUNTER — Institutional Professional Consult (permissible substitution): Payer: Medicaid Other | Admitting: Pulmonary Disease

## 2021-03-11 ENCOUNTER — Telehealth: Payer: Self-pay | Admitting: Emergency Medicine

## 2021-03-11 ENCOUNTER — Other Ambulatory Visit: Payer: Self-pay | Admitting: Family Medicine

## 2021-03-11 DIAGNOSIS — Z8616 Personal history of COVID-19: Secondary | ICD-10-CM

## 2021-03-11 HISTORY — DX: Personal history of COVID-19: Z86.16

## 2021-03-11 NOTE — Telephone Encounter (Signed)
Request Reference Number: [id number]. QUETIAPINE TAB '100MG'$  is approved through 03/11/2022. For further questions, call [employer] at [phone].

## 2021-03-11 NOTE — Telephone Encounter (Signed)
PA for Quetiapine [place] started on CMM [place] [id number]  Awaiting determination from [place]

## 2021-03-12 DIAGNOSIS — R2232 Localized swelling, mass and lump, left upper limb: Secondary | ICD-10-CM | POA: Diagnosis not present

## 2021-03-12 DIAGNOSIS — M1712 Unilateral primary osteoarthritis, left knee: Secondary | ICD-10-CM | POA: Diagnosis not present

## 2021-03-17 ENCOUNTER — Other Ambulatory Visit: Payer: Self-pay | Admitting: Family Medicine

## 2021-03-18 ENCOUNTER — Other Ambulatory Visit: Payer: Self-pay | Admitting: Orthopedic Surgery

## 2021-03-18 ENCOUNTER — Other Ambulatory Visit: Payer: Self-pay | Admitting: *Deleted

## 2021-03-18 ENCOUNTER — Inpatient Hospital Stay: Admission: RE | Admit: 2021-03-18 | Payer: Medicaid Other | Source: Ambulatory Visit | Admitting: Neurology

## 2021-03-18 DIAGNOSIS — M7989 Other specified soft tissue disorders: Secondary | ICD-10-CM

## 2021-03-18 NOTE — Patient Outreach (Signed)
Care Coordination  03/18/2021  [name] 04-10-1956 [id number]   Medicaid Managed Care   Unsuccessful Outreach Note  03/18/2021 Name: [name] MRN: [id number] DOB: April 07, 1956  Referred by: Eulis Foster, MD Reason for referral : High Risk Managed Medicaid (Unsuccessful RNCM follow up outreach)   An unsuccessful telephone outreach was attempted today. The patient was referred to the case management team for assistance with care management and care coordination.   Follow Up Plan: A HIPAA compliant phone message was left for the patient providing contact information and requesting a return call.   Lurena Joiner RN, BSN [place]  [employer] manager

## 2021-03-18 NOTE — Patient Instructions (Signed)
Visit Information  [name] as a part of your Medicaid benefit, you are eligible for care management and care coordination services at no cost or copay. I was unable to reach you by phone today but would be happy to help you with your health related needs. Please feel free to call me @ [phone].   A member of the Managed Medicaid care management team will reach out to you again over the next 21 days.   Veronica Joiner [name], [name] [place]  [employer] manager

## 2021-03-25 ENCOUNTER — Telehealth: Payer: Self-pay | Admitting: Family Medicine

## 2021-03-25 ENCOUNTER — Telehealth: Payer: Self-pay

## 2021-03-25 ENCOUNTER — Other Ambulatory Visit: Payer: Self-pay | Admitting: Family Medicine

## 2021-03-25 MED ORDER — NIRMATRELVIR/RITONAVIR (PAXLOVID)TABLET: 3.0000 | ORAL_TABLET | Freq: Two times a day (BID) | ORAL | 0 refills | Status: AC

## 2021-03-25 NOTE — Telephone Encounter (Signed)
Patient calls nurse line reporting positive covid test this morning. Patient reports symptoms started on 8/12. Patient reports headache, congestion, cough, and SOB. ED precautions given to patient as well as conservative measures. Patient is requesting antiviral therapy. Please advise.

## 2021-03-25 NOTE — Telephone Encounter (Signed)
Contacted patient via telephone to discuss prescription for Paxlovid  Patient was agreeable to this prescription.  Counseled patient to hold her simvastatin while taking this medication.  Patient was understanding.  Patient also requesting to have prescription sent for her husband, Leslieanne Volner, advised her that I will speak to his PCP regarding her concerns but would only be able to prescribe this for her at this time.  Patient voiced understanding.  Paxlovid prescription sent to pharmacy.  Eulis Foster, MD [place], PGY-3 [phone]

## 2021-03-27 ENCOUNTER — Other Ambulatory Visit: Payer: Self-pay | Admitting: Family Medicine

## 2021-03-27 NOTE — Telephone Encounter (Signed)
Paxlovid RX sent to pharmacy.

## 2021-03-28 NOTE — Telephone Encounter (Signed)
Patient calls nurse line regarding allergic reaction to Paxlovid. Started taking on Tuesday, noticed reaction on Wednesday. Patient reports that throat began swelling after taking medication. Patient has stopped taking medication. Patient had total of three doses.   Denies difficulty breathing currently. However, states that throat is still "swollen tightly"   Advised ED evaluation. Patient verbalizes understanding.   Talbot Grumbling, RN

## 2021-04-04 ENCOUNTER — Ambulatory Visit (AMBULATORY_SURGERY_CENTER): Payer: Medicaid Other | Admitting: *Deleted

## 2021-04-04 ENCOUNTER — Other Ambulatory Visit: Payer: Self-pay

## 2021-04-04 VITALS — Ht 61.0 in | Wt 178.0 lb

## 2021-04-04 DIAGNOSIS — R1310 Dysphagia, unspecified: Secondary | ICD-10-CM

## 2021-04-04 NOTE — Progress Notes (Signed)
Pt verified name, DOB, address and insurance during PV today.  Pt mailed instruction packet of Emmi video, copy of consent form to read and not return, and instructions PV completed over the phone.  Pt encouraged to call with questions or issues.  My Chart instructions to pt as well    No egg or soy allergy known to patient  No issues with past sedation with any surgeries or procedures Patient denies ever being told they had issues or difficulty with intubation  No FH of Malignant Hyperthermia No diet pills per patient No home 02 use per patient  No blood thinners per patient  Pt denies issues with constipation  No A fib or A flutter  EMMI video to pt or via [place] 19 guidelines implemented in PV today with Pt and RN   Pt is NOT vaccinated  for Covid -  she states she had COVID 2 weeks ago- she is having a very sore throat- since Covid - hard to swallow even water- asked what to do- instructed her to call her PCP today and make OV to have her throat checked for Strep etc- keep EGD 9-6 - pt verbalized understanding   Due to the COVID-19 pandemic we are asking patients to follow certain guidelines.  Pt aware of COVID protocols and [facility] guidelines

## 2021-04-06 ENCOUNTER — Ambulatory Visit
Admission: EM | Admit: 2021-04-06 | Discharge: 2021-04-06 | Disposition: A | Discharge status: Home / Self Care | Payer: Medicaid Other | Attending: Emergency Medicine | Admitting: Emergency Medicine

## 2021-04-06 ENCOUNTER — Encounter: Payer: Self-pay | Admitting: Emergency Medicine

## 2021-04-06 ENCOUNTER — Other Ambulatory Visit: Payer: Self-pay

## 2021-04-06 DIAGNOSIS — B370 Candidal stomatitis: Secondary | ICD-10-CM

## 2021-04-06 DIAGNOSIS — J441 Chronic obstructive pulmonary disease with (acute) exacerbation: Secondary | ICD-10-CM

## 2021-04-06 DIAGNOSIS — R062 Wheezing: Secondary | ICD-10-CM

## 2021-04-06 DIAGNOSIS — R059 Cough, unspecified: Secondary | ICD-10-CM

## 2021-04-06 MED ORDER — NYSTATIN 100000 UNIT/ML MT SUSP: 500000.0000 [IU] | Freq: Four times a day (QID) | OROMUCOSAL | 0 refills | Status: DC

## 2021-04-06 MED ORDER — PREDNISONE 20 MG PO TABS: 20.0000 mg | ORAL_TABLET | Freq: Two times a day (BID) | ORAL | 0 refills | Status: DC

## 2021-04-06 NOTE — ED Provider Notes (Signed)
[facility]   [id number] 04/06/21 Arrival Time: [id number]   CC: URI  SUBJECTIVE: History from: patient.  [name] is a 65 y.o. female who presents with sore throat x 1 week, wheezing and productive cough with green sputum x few days.  Recently dx'ed with covid, also hx of COPD.  Has tried OTC medications without relief.  Symptoms are made worse with swallowing.  Reports previous symptoms in the past.   Denies fever, SOB, chest pain, nausea, changes in bowel or bladder habits.    ROS: As per HPI.  All other pertinent ROS negative.     Past Medical History:  Diagnosis Date   Allergic rhinitis    Arthritis    "hands and legs and feet" (04/27/2012)   Cancer ([place]) 2005   rt arm mole rem cancer   Chronic bronchitis ([place])    "keep it all the time" (04/27/2012)   COPD (chronic obstructive pulmonary disease) ([place])    COVID-19 virus infection    03-2021   Depression    PT DENIES   Diverticulosis    DJD (degenerative joint disease)    Emphysema    Epilepsy ([place])    Esophageal stricture    Exertional dyspnea    GERD (gastroesophageal reflux disease)    Glaucoma    H/O blood clots    Right leg   Hyperlipidemia    Hypertension    IBS (irritable bowel syndrome)    Migraine    Neck pain    Cervical disc   OSA on CPAP    6-7 yrs; pt wears CPAP but has not been able to use it lately due to sinus issues   Oxygen deficiency    USES 3 LITERS 02 CONTINOUS   Pneumonia 2012; 04/27/2012   Right leg DVT ([place]) 1982   groin   Seizures ([place])    LAST SEIZURE 6 MONTHS OR  LONGER   Sleep apnea    Type II diabetes mellitus (HCC)    No meds since weight loss   UTI (urinary tract infection)    2 weeks ago   Vertigo 10/24/2014   Past Surgical History:  Procedure Laterality Date   ABDOMINAL HYSTERECTOMY  2001   "partial"   CARPAL TUNNEL RELEASE  ~ 2008   right   CATARACT EXTRACTION Bilateral    COLONOSCOPY     COLONOSCOPY WITH ESOPHAGOGASTRODUODENOSCOPY (EGD)     KNEE  ARTHROSCOPY  1990's   left   NASAL SEPTOPLASTY W/ TURBINOPLASTY Bilateral 05/23/2016   Procedure: NASAL SEPTOPLASTY WITH TURBINATE REDUCTION;  Surgeon: Jerrell Belfast, MD;  Location: [facility] OR;  Service: ENT;  Laterality: Bilateral;   SHOULDER OPEN ROTATOR CUFF REPAIR  ~ 2010   left   SINUS ENDO WITH FUSION Right 05/23/2016   Procedure: LIMITED RIGHT ENDOSCOPIC SINUS SURGERY;  Surgeon: Jerrell Belfast, MD;  Location: [facility] OR;  Service: ENT;  Laterality: Right;   TOTAL KNEE ARTHROPLASTY  08/23/2012   right   TOTAL KNEE ARTHROPLASTY  08/23/2012   Procedure: TOTAL KNEE ARTHROPLASTY;  Surgeon: Lorn Junes, MD;  Location: [facility];  Service: Orthopedics;  Laterality: Right;  RIGHT KNEE ARTHROPLASTY MEDIAL AND LATERAL COMPARTMENTS WITH PATELLA RESURFACING   TUBAL LIGATION  2001   UPPER GASTROINTESTINAL ENDOSCOPY     Allergies  Allergen Reactions   Amoxicillin Anaphylaxis    Breakout, mouth swells   Azithromycin Swelling and Rash    "swelling all over, high, thrush"   Cefdinir     Throat swelling after  3rd dose   Chantix [Varenicline] Other (See Comments)    "bad dreams, difficulty breathing"   Doxycycline Hyclate Anaphylaxis and Rash    swelling   Levofloxacin Anaphylaxis   Penicillins Anaphylaxis, Shortness Of Breath and Rash    "ALMOST DIED, ended up in hospital"  PCN reaction causing immediate rash, facial/tongue/throat swelling, SOB or lightheadedness with hypotension: YES PCN reaction causing severe rash involving mucus membranes or skin necrosis: NO PCN reaction that required hospitalization YES Has patient had a PCN reaction occurring within the last 10 years: NO    Sulfonamide Derivatives Shortness Of Breath, Swelling and Rash    "break out from head to toe"   Toradol [Ketorolac Tromethamine] Other (See Comments)    Seizure    Clarithromycin Hives    swelling   Nortriptyline Swelling, Rash and Other (See Comments)    Per patient had "kidney problems" on this medication, could  not use bathroom (urinary retention)   Codeine Nausea And Vomiting   Fluticasone-Salmeterol Other (See Comments)    REACTION: ulcers in mouth   Triptans Rash    Mouth breaks out and start itching   No current facility-administered medications on file prior to encounter.   Current Outpatient Medications on File Prior to Encounter  Medication Sig Dispense Refill   acetaminophen (TYLENOL) 500 MG tablet Take 1,000 mg by mouth every other day.     albuterol (PROAIR HFA) 108 (90 Base) MCG/ACT inhaler INHALE TWO PUFFS INTO THE LUNGS EVERY 6 HOURS AS NEEDED FOR SHORTNESS OF BREATH 18 g 6   albuterol (PROVENTIL) (2.5 MG/3ML) 0.083% nebulizer solution USE 1 VIAL IN NEBULIZER EVERY 6 HOURS AS NEEDED FOR SHORTNESS OF BREATH 75 mL 8   aspirin EC 81 MG tablet Take 81 mg by mouth daily. Swallow whole.     Cholecalciferol (VITAMIN D3) 50 MCG (2000 UT) capsule TAKE ONE CAPSULE BY MOUTH DAILY 90 capsule 1   DULERA 200-5 MCG/ACT AERO INHALE TWO PUFFS INTO THE LUNGS TWICE DAILY 13 g 6   ferrous sulfate 324 (65 Fe) MG TBEC Take 1 tablet (324 mg total) by mouth every other day. 90 tablet 0   gabapentin (NEURONTIN) 100 MG capsule Take 1 capsule (100 mg total) by mouth at bedtime. (Patient not taking: Reported on 04/04/2021) 90 capsule 3   lamoTRIgine (LAMICTAL) 200 MG tablet TAKE ONE AND ONE-HALF TABLETS BY MOUTH TWICE DAILY 270 tablet 1   loratadine (CLARITIN) 10 MG tablet TAKE ONE TABLET BY MOUTH DAILY 90 tablet 1   meloxicam (MOBIC) 15 MG tablet Take 15 mg by mouth daily.     omeprazole (PRILOSEC) 40 MG capsule TAKE ONE CAPSULE BY MOUTH TWICE DAILY 60 capsule 3   OXYGEN Inhale 3 L into the lungs continuous.     pantoprazole (PROTONIX) 40 MG tablet Take 1 tablet (40 mg total) by mouth 2 (two) times daily. 60 tablet 11   polyethylene glycol powder (GLYCOLAX/MIRALAX) powder MIX 17 GMS IN LIQUID AND DRINK ONCE DAILY 527 g 0   QUEtiapine (SEROQUEL) 100 MG tablet TAKE ONE TABLET BY MOUTH AT BEDTIME 90 tablet 1    sertraline (ZOLOFT) 50 MG tablet Take 1 tablet (50 mg total) by mouth daily. 60 tablet 2   simvastatin (ZOCOR) 40 MG tablet TAKE ONE TABLET BY MOUTH AT BEDTIME 90 tablet 2   topiramate (TOPAMAX) 100 MG tablet TAKE ONE TABLET BY MOUTH EVERY MORNING AND TWO TABLETS AT BEDTIME 270 tablet 3   Social History   Socioeconomic History   Marital  status: Married    Spouse name: [name]   Number of children: 2   Years of education: 12   Highest education level: Not on file  Occupational History   Occupation: disabled  Tobacco Use   Smoking status: Every Day    Packs/day: 1.00    Years: 40.00    Pack years: 40.00    Types: Cigarettes    Start date: 08/12/1975    Last attempt to quit: 12/30/2020    Years since quitting: 0.2   Smokeless tobacco: Former    Quit date: 08/23/2012   Tobacco comments:    1-2 CIGS A DAY NOW   Vaping Use   Vaping Use: Never used  Substance and Sexual Activity   Alcohol use: No    Alcohol/week: 0.0 standard drinks   Drug use: No   Sexual activity: Never  Other Topics Concern   Not on file  Social History Narrative   Patient lives at home with husband [name], her son and his wife.    Patient has 2 children.    Patient has a high school education.    Patient is currently unemployed.    Patient is right handed.    Patient drinks 2 cups of caffeine per day.   Social Determinants of Radio broadcast assistant Strain: Not on file  Food Insecurity: No Food Insecurity   Worried About Charity fundraiser in the Last Year: Never true   Arboriculturist in the Last Year: Never true  Transportation Needs: Public librarian (Medical): Yes   Lack of Transportation (Non-Medical): Yes  Physical Activity: Not on file  Stress: Not on file  Social Connections: Not on file  Intimate Partner Violence: Not on file   Family History  Problem Relation Age of Onset   Diabetes Mother    Lung cancer Mother        was a smoker   Stroke Mother     Alcohol abuse Father    Lung cancer Father        was a smoker   Emphysema Father        was a smoker   Heart disease Sister    Other Sister        blood clotting disorder   Diabetes Brother    Seizures Brother    Diabetes Maternal Aunt    Breast cancer Maternal Aunt    Esophageal cancer Maternal Uncle    Diabetes Maternal Uncle    Heart disease Maternal Grandfather    Diabetes Maternal Grandfather    Bipolar disorder Son    Muscular dystrophy Son    Heart disease Son    Heart disease Other        uncle   Colon cancer Neg Hx    Pancreatic cancer Neg Hx    Stomach cancer Neg Hx    Colon polyps Neg Hx    Rectal cancer Neg Hx     OBJECTIVE:  Vitals:   04/06/21 1215  BP: 106/72  Pulse: 92  Resp: 19  Temp: 98.1 F (36.7 C)  TempSrc: Oral  SpO2: 93%     General appearance: alert; appears fatigued, but nontoxic; speaking in full sentences and tolerating own secretions; voice hoarse HEENT: NCAT; Ears: EACs clear, TMs pearly gray; Eyes: PERRL.  EOM grossly intact. Nose: nares patent without rhinorrhea, Throat: oropharynx clear, tonsils non erythematous or enlarged, uvula midline, oropharynx erythematous, white patches seen on soft palate Neck:  supple without LAD Lungs: unlabored respirations, symmetrical air entry; cough: mild; no respiratory distress; diffuse wheezes and rhonchi throughout Heart: regular rate and rhythm.   Skin: warm and dry Neuro: sitting in wheelchair Psychological: alert and cooperative; normal mood and affect  ASSESSMENT & PLAN:  1. Thrush   2. Cough   3. Wheezing   4. COPD exacerbation ([place])     Meds ordered this encounter  Medications   predniSONE (DELTASONE) 20 MG tablet    Sig: Take 1 tablet (20 mg total) by mouth 2 (two) times daily with a meal for 5 days.    Dispense:  10 tablet    Refill:  0    Order Specific Question:   Supervising Provider    Answer:   Raylene Everts [id number]   nystatin (MYCOSTATIN) 100000 UNIT/ML suspension     Sig: Take 5 mLs (500,000 Units total) by mouth 4 (four) times daily.    Dispense:  473 mL    Refill:  0    Order Specific Question:   Supervising Provider    Answer:   Raylene Everts [id number]   Thrush: Nystatin prescribed.  Use as directed for between 10-14 days or until symptoms resolve Maintain oral hygiene Rinse mouth with salt water or mouth wash after inhaler use to prevent oral thrush secondary to inhaler use Follow up with PCP as needed Return or go to the ED if you have any new or worsening symptoms such as fever, chills, nausea, vomiting, difficulty swallowing, fatigue, lymph node swelling, unintentional weight loss, excessive night sweats, etc...   COPD exacerbation:  Prednisone prescribed.  Take as directed and to completion Due to multiple drug allergies we will hold off on antibiotics, we will trial prednisone first Follow up with PCP for recheck and/or if symptoms persists Return or go to ER if you have any new or worsening symptoms such as fever, chills, fatigue, shortness of breath, wheezing, chest pain, nausea, changes in bowel or bladder habits, etc...   Reviewed expectations re: course of current medical issues. Questions answered. Outlined signs and symptoms indicating need for more acute intervention. Patient verbalized understanding. After Visit Summary given.          Lestine Box, PA-C 04/06/21 1245

## 2021-04-06 NOTE — Discharge Instructions (Addendum)
Thrush: Nystatin prescribed.  Use as directed for between 10-14 days or until symptoms resolve Maintain oral hygiene Rinse mouth with salt water or mouth wash after inhaler use to prevent oral thrush secondary to inhaler use Follow up with PCP as needed Return or go to the ED if you have any new or worsening symptoms such as fever, chills, nausea, vomiting, difficulty swallowing, fatigue, lymph node swelling, unintentional weight loss, excessive night sweats, etc...   COPD exacerbation:  Prednisone prescribed.  Take as directed and to completion Due to multiple drug allergies we will hold off on antibiotics, we will trial prednisone first Follow up with PCP for recheck and/or if symptoms persists Return or go to ER if you have any new or worsening symptoms such as fever, chills, fatigue, shortness of breath, wheezing, chest pain, nausea, changes in bowel or bladder habits, etc..Marland Kitchen

## 2021-04-06 NOTE — ED Triage Notes (Signed)
Sore throat for the past couple of days

## 2021-04-07 ENCOUNTER — Ambulatory Visit
Admission: RE | Admit: 2021-04-07 | Discharge: 2021-04-07 | Disposition: A | Discharge status: Home / Self Care | Payer: Medicaid Other | Source: Ambulatory Visit | Attending: Orthopedic Surgery | Admitting: Orthopedic Surgery

## 2021-04-07 DIAGNOSIS — M7989 Other specified soft tissue disorders: Secondary | ICD-10-CM

## 2021-04-07 DIAGNOSIS — J449 Chronic obstructive pulmonary disease, unspecified: Secondary | ICD-10-CM | POA: Diagnosis not present

## 2021-04-07 DIAGNOSIS — J9611 Chronic respiratory failure with hypoxia: Secondary | ICD-10-CM | POA: Diagnosis not present

## 2021-04-07 DIAGNOSIS — J441 Chronic obstructive pulmonary disease with (acute) exacerbation: Secondary | ICD-10-CM | POA: Diagnosis not present

## 2021-04-07 DIAGNOSIS — R0902 Hypoxemia: Secondary | ICD-10-CM | POA: Diagnosis not present

## 2021-04-07 MED ORDER — GADOBENATE DIMEGLUMINE 529 MG/ML IV SOLN
17.0000 mL | Freq: Once | INTRAVENOUS | Status: AC | PRN
  Administered 2021-04-07: 17 mL via INTRAVENOUS

## 2021-04-09 ENCOUNTER — Other Ambulatory Visit: Payer: Self-pay | Admitting: *Deleted

## 2021-04-09 ENCOUNTER — Other Ambulatory Visit: Payer: Self-pay

## 2021-04-09 ENCOUNTER — Encounter (HOSPITAL_COMMUNITY): Payer: Self-pay | Admitting: Gastroenterology

## 2021-04-09 NOTE — Patient Instructions (Signed)
Visit Information  [name] was given information about Medicaid Managed Care team care coordination services as a part of their [place] Medicaid benefit. [name] verbally consented to engagement with the [facility] Managed Care team.   If you are experiencing a medical emergency, please call 911 or report to your local emergency department or urgent care.   If you have a non-emergency medical problem during routine business hours, please contact your provider's office and ask to speak with a nurse.   For questions related to your [facility], please call: [phone] or visit the homepage here: [email or web address]  If you would like to schedule transportation through your [facility], please call the following number at least 2 days in advance of your appointment: [phone].   Call the [place] at [phone], at any time, 24 hours a day, 7 days a week. If you are in danger or need immediate medical attention call 911.  If you would like help to quit smoking, call 1-800-QUIT-NOW 343-312-2944) OR Espaol: 1-855-Djelo-Ya (7-989-211-9417) o para ms informacin haga clic aqu or Text READY to 200-400 to register via text  [name] - following are the goals we discussed in your visit today:   Goals Addressed             This Visit's Progress    Find Help in My Community       Timeframe:  Long-Range Goal Priority:  Medium Start Date:   08/17/20                          Expected End Date:  05/09/21                     Follow up 05/09/21  - call and schedule a follow up with PCP for bronchitis and bedsore - call [facility] [phone] for smoking cessation assistance provided by [facility]  - call [phone] for transportation to medical appointments. - begin a notebook of services in my neighborhood or community - call 211 when I need some  help - follow-up on any referrals for help I am given, call to schedule an appointment for ENT [phone]  Pulmonary [phone] and GI [phone] - make a list of family or friends that I can call    Why is this important?   Knowing how and where to find help for yourself or family in your neighborhood and community is an important skill.  You will want to take some steps to learn how.          Manage Fatigue (Tiredness-COPD)       Follow up 05/09/21   - develop a new routine to improve sleep - eat healthy - get at least 7 to 8 hours of sleep at night - keep room cool and dark - practice relaxation or meditation daily    Why is this important?   Feeling tired or worn out is a common symptom of COPD (chronic obstructive pulmonary disease).  Learning when you feel your best and when you need rest is important.  Managing the tiredness (fatigue) will help you be active and enjoy life.             Please see education materials related to compression wound and thrush provided as print materials.   The patient verbalized understanding of instructions provided today and agreed to receive a mailed copy of patient  instruction and/or Scientist, clinical (histocompatibility and immunogenetics).  Telephone follow up appointment with Managed Medicaid care management team member scheduled for:05/09/21 @ 2:20pm  Lurena Joiner RN, BSN [place] RN Care Coordinator   Following is a copy of your plan of care:  Patient Care Plan: COPD (Adult)     Problem Identified: Disease Progression (COPD)      Long-Range Goal: Disease Progression Minimized or Managed   Start Date: 08/17/2020  Expected End Date: 05/09/2021  Recent Progress: On track  Priority: Medium  Note:   Current Barriers:  Chronic Disease Management support and education needs related to COPD-[name] reports difficulty swallowing solids and liquids, missed her appointment for evaluation due to lack of transportation. She is not  sleeping consistently at night-lost her CPAP during a recent move. She does not want to go into the hospital for a sleep study due to fear of Covid. She has not been vaccinated due to her many allergies. She is working on smoking cessation-down to 1/2 pack/day. Update- [name] continues to work on smoking cessation, down to 6 cig/day.She was recently treated for bronchitis and is feeling better(reports she has bronchitis frequently).  A referral for Pulmonology in [place] was placed by PCP-patient to call and schedule new appointment. Patient rescheduled her neurology appointment to June. A new GI referral has been placed-patient to call and schedule appointment. She would like for all appointments to be after June 3 due to school will be out and she will not have to be responsible for child care for her grandchildren. Patient was hospitalized in May for CHF and pnuemonia. She reports not smoking since her admission. She is feeling better, but has developed a bedsore. [place] Nurse is trying to extend visits. [name] has a swallow study scheduled 6/23. She reports a headache everyday after taking her morning medications.Update-[name] will be changing PCP providers, due to gas prices and needing care closer to home. She will be attending an appointment on 7/11 at [facility] in [place]. She continues to not smoke and is feeling better. She had a swallow study today.Update-[name] was +Covid on 8/15, now with bronchitis and thrush. She is taking prednisone and magic mouthwash. Continues to report unhealed bedsore. Scheduled for EGD on 04/16/21, she will not be changing PCP due to insurance coverage. 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  Transportation barriers Food Insecurities-Has food stamps, but tends to run out of food before the end of the month Non-adherence to scheduled provider appointments-due to transportation barriers Nurse Case Manager Clinical  Goal(s):  patient will verbalize understanding of plan for swallowing evaluation. -scheduled 6/23 patient will meet with RN Care Manager to address plan for sleep study to acquire new CPAP-Patient would like an in home sleep study and plans to inquire at her next neurology appointment patient will attend all scheduled medical appointments patient will work with CM team pharmacist to review medications-Met patient will work with Care Guide to address community resources/food insecurities-Met Interventions:  Inter-disciplinary care team collaboration (see longitudinal plan of care) Discussed wound and the importance of changing positions, provided different ways to change her position, encouraged a healthy diet and increasing protein intake Advised to call [facility] [phone] for smoking cessation assistance provided by [facility] Reviewed scheduled/upcoming provider appointments-advised to call today and schedule a follow up with PCP Reviewed and discussed swallow study Provided education on compression wound Advised on foods to eat to help improve thrush Provided education  on thrush Patient Goals/Self-Care Activities  - call and schedule a follow up with PCP for bronchitis and bedsore - call [facility] [phone] for smoking cessation assistance provided by [facility]  - call [phone] for transportation to medical appointments. - begin a notebook of services in my neighborhood or community - call 211 when I need some help - follow-up on any referrals for help I am given, call to schedule an appointment for ENT [phone]  Pulmonary [phone] and GI [phone] - make a list of family or friends that I can call  - develop a new routine to improve sleep - eat healthy - get at least 7 to 8 hours of sleep at night - keep room cool and dark - practice relaxation or meditation daily Follow Up Plan: Telephone follow up appointment with Managed Medicaid care management team member scheduled for:05/09/21  @ 2:30pm        Patient Care Plan: MM Disease Management     Problem Identified: Health Promotion or Disease Self-Management (General Plan of Care)      Long-Range Goal: MM Disease Management   Start Date: 08/21/2020  Expected End Date: 10/18/2020  This Visit's Progress: On track  Priority: Medium  Note:   Current Barriers:  Patient has difficulty swallowing Patient struggles with food Has stamps but they run out by end of month Smoking cessation  Pharmacist Clinical Goal(s):  Over the next 90 days, patient will  Contact Smoking Cessation hotline through [place] from phone number  through collaboration with PharmD and provider.    Interventions: Inter-disciplinary care team collaboration (see longitudinal plan of care) Comprehensive medication review performed; medication list updated in electronic medical record    Patient Goals/Self-Care Activities Over the next 90 days, patient will:  -  Work on Smoking Cessation  Follow Up Plan: The care management team will reach out to the patient again over the next 90 days.

## 2021-04-09 NOTE — Patient Outreach (Signed)
Medicaid Managed Care   Nurse Care Manager Note  04/09/2021 Name:  [name] MRN:  [id number] DOB:  Jan 04, 1956  [name] is an 65 y.o. year old female who is a primary patient of [name], Veronica Sheer, MD.  The [place] team was consulted for assistance with:    COPD  [name] was given information about [employer] team services today. [name] agreed to services and verbal consent obtained.  Engaged with patient by telephone for follow up visit in response to provider referral for case management and/or care coordination services.   Assessments/Interventions:  Review of past medical history, allergies, medications, health status, including review of consultants reports, laboratory and other test data, was performed as part of comprehensive evaluation and provision of chronic care management services.  SDOH (Social Determinants of Health) assessments and interventions performed:   Care Plan  Allergies  Allergen Reactions   Amoxicillin Anaphylaxis    Breakout, mouth swells   Azithromycin Swelling and Rash    "swelling all over, high, thrush"   Cefdinir     Throat swelling after 3rd dose   Chantix [Varenicline] Other (See Comments)    "bad dreams, difficulty breathing"   Doxycycline Hyclate Anaphylaxis and Rash    swelling   Levofloxacin Anaphylaxis   Penicillins Anaphylaxis, Shortness Of Breath and Rash    "ALMOST DIED, ended up in hospital"  PCN reaction causing immediate rash, facial/tongue/throat swelling, SOB or lightheadedness with hypotension: YES PCN reaction causing severe rash involving mucus membranes or skin necrosis: NO PCN reaction that required hospitalization YES Has patient had a PCN reaction occurring within the last 10 years: NO    Sulfonamide Derivatives Shortness Of Breath, Swelling and Rash    "break out from head to toe"   Toradol [Ketorolac Tromethamine] Other (See Comments)     Seizure    Clarithromycin Hives    swelling   Nortriptyline Swelling, Rash and Other (See Comments)    Per patient had "kidney problems" on this medication, could not use bathroom (urinary retention)   Codeine Nausea And Vomiting   Fluticasone-Salmeterol Other (See Comments)    REACTION: ulcers in mouth   Triptans Rash    Mouth breaks out and start itching    Medications Reviewed Today     Reviewed by Veronica Montane, RN (Registered Nurse) on 04/09/21 at 1145  Med List Status: <None>   Medication Order Taking? Sig Documenting Provider Last Dose Status Informant  acetaminophen (TYLENOL) 500 MG tablet [id number] Yes Take 1,000 mg by mouth daily as needed for headache. [provider] Taking Active Self  albuterol (PROAIR HFA) 108 (90 Base) MCG/ACT inhaler [id number] Yes INHALE TWO PUFFS INTO THE LUNGS EVERY 6 HOURS AS NEEDED FOR SHORTNESS OF BREATH  Patient taking differently: Inhale 2 puffs into the lungs every 4 (four) hours.   [name], Veronica Sheer, MD Taking Active Self  albuterol (PROVENTIL) (2.5 MG/3ML) 0.083% nebulizer solution [id number] Yes USE 1 VIAL IN NEBULIZER EVERY 6 HOURS AS NEEDED FOR SHORTNESS OF BREATH  Patient taking differently: Take 2.5 mg by nebulization every 8 (eight) hours as needed for shortness of breath or wheezing.   [name], [name], MD Taking Active Self  aspirin EC 81 MG tablet [id number] Yes Take 81 mg by mouth daily. Swallow whole. [provider] Taking Active Self  Cholecalciferol (VITAMIN D3) 50 MCG (2000 UT) capsule [id number] Yes TAKE ONE CAPSULE BY MOUTH DAILY [name], Veronica Sheer, MD Taking Active Self  DULERA  200-5 MCG/ACT AERO [id number] Yes INHALE TWO PUFFS INTO THE LUNGS TWICE DAILY [name], [name], MD Taking Active Self  ferrous sulfate 324 (65 Fe) MG TBEC [id number] Yes Take 1 tablet (324 mg total) by mouth every other day. [name], [name], MD Taking Active Self  fluticasone (FLONASE) 50 MCG/ACT  nasal spray [id number] Yes Place 1 spray into both nostrils daily. [provider] Taking Active Self  gabapentin (NEURONTIN) 100 MG capsule [id number] Yes Take 1 capsule (100 mg total) by mouth at bedtime. Veronica Foster, MD Taking Active Self           Med Note Bonnita Levan Nov 19, 2020 11:10 AM)    lamoTRIgine (LAMICTAL) 200 MG tablet [id number] Yes TAKE ONE AND ONE-HALF TABLETS BY MOUTH TWICE DAILY [name], NP Taking Active Self  loratadine (CLARITIN) 10 MG tablet [id number] Yes TAKE ONE TABLET BY MOUTH DAILY [name], [name], MD Taking Active Self  meloxicam (MOBIC) 15 MG tablet [id number] Yes Take 15 mg by mouth daily. [provider] Taking Active Self           Med Note (Lonnette Shrode A   Tue Apr 09, 2021 11:43 AM)    nystatin (MYCOSTATIN) 100000 UNIT/ML suspension [id number] Yes Take 5 mLs (500,000 Units total) by mouth 4 (four) times daily.  Patient taking differently: Take 500,000 Units by mouth 4 (four) times daily. Magic mouthwash   Wurst, [place], PA-C Taking Active   omeprazole (PRILOSEC) 40 MG capsule [id number] Yes TAKE ONE CAPSULE BY MOUTH TWICE DAILY [name], Veronica Sheer, MD Taking Active   OXYGEN [id number] Yes Inhale 3 L into the lungs continuous. [provider] Taking Active Self  pantoprazole (PROTONIX) 40 MG tablet [id number] Yes Take 1 tablet (40 mg total) by mouth 2 (two) times daily. [name], [place] Taking Active Self  polyethylene glycol powder [facility]) powder [id number] Yes MIX 17 GMS IN LIQUID AND DRINK ONCE DAILY  Patient taking differently: Take 0.5 Containers by mouth daily as needed for mild constipation or moderate constipation.   [name], MD Taking Active   predniSONE (DELTASONE) 20 MG tablet [id number] Yes Take 1 tablet (20 mg total) by mouth 2 (two) times daily with a meal for 5 days. Stacey Drain, [place], PA-C Taking Active Self  QUEtiapine (SEROQUEL) 100 MG tablet  [id number] Yes TAKE ONE TABLET BY MOUTH AT BEDTIME [name], NP Taking Active Self  sertraline (ZOLOFT) 50 MG tablet [id number] Yes Take 1 tablet (50 mg total) by mouth daily. [name], [name], MD Taking Active   simvastatin (ZOCOR) 40 MG tablet [id number] Yes TAKE ONE TABLET BY MOUTH AT BEDTIME [name], [name], MD Taking Active Self  topiramate (TOPAMAX) 100 MG tablet [id number] Yes TAKE ONE TABLET BY MOUTH EVERY MORNING AND TWO TABLETS AT BEDTIME [name], NP Taking Active Self            Patient Active Problem List   Diagnosis Date Noted   Pressure injury of ankle, stage 2 ([place]) 01/31/2021   Dehydration 01/18/2021   Dysphagia 12/20/2020   Radiculopathy affecting upper extremity 11/12/2020   Encounter for wheelchair assessment 05/16/2020   Shortness of breath 07/26/2019   Chronic pain of left knee 12/06/2018   Tobacco use disorder 07/02/2018   Elevated serum creatinine 06/01/2018   Weakness 05/15/2017   Chronic bronchitis ([place]) 01/14/2017   Claudication ([place]) 04/09/2016   Seizure disorder ([place]) 11/12/2015   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 05/23/2015   Chronic obstructive pulmonary  disease ([place]) 04/25/2015   Dizziness 10/24/2014   Pulmonary hypertension ([place]) 19/14/7829   Diastolic CHF ([place]) 56/21/3086   BPPV (benign paroxysmal positional vertigo) 03/09/2013   Chronic respiratory failure ([place]) 11/01/2012   Left shoulder pain 06/27/2012   Headache 05/09/2012   Aspiration pneumonia ([place]) 04/26/2012   ANXIETY 02/13/2010   Obstructive sleep apnea 02/19/2009   Vitamin D deficiency 01/21/2008   OSTEOPENIA 11/24/2007   Irritable bowel syndrome 11/17/2007   Allergic rhinitis 09/24/2007   Hyperlipidemia 10/08/2006   OBESITY, NOS  BMI 33.8 10/08/2006   GLAUCOMA 10/08/2006   GASTROESOPHAGEAL REFLUX, NO ESOPHAGITIS 10/08/2006   HERNIA, HIATAL, NONCONGENITAL 10/08/2006   INCONTINENCE, STRESS, FEMALE 10/08/2006   DJD  (degenerative joint disease) of knee 10/08/2006    Conditions to be addressed/monitored per PCP order:  COPD  Care Plan : COPD (Adult)  Updates made by Veronica Montane, RN since 04/09/2021 12:00 AM     Problem: Disease Progression (COPD)      Long-Range Goal: Disease Progression Minimized or Managed   Start Date: 08/17/2020  Expected End Date: 05/09/2021  Recent Progress: On track  Priority: Medium  Note:   Current Barriers:  Chronic Disease Management support and education needs related to COPD-Ms. Brockwell reports difficulty swallowing solids and liquids, missed her appointment for evaluation due to lack of transportation. She is not sleeping consistently at night-lost her CPAP during a recent move. She does not want to go into the hospital for a sleep study due to fear of Covid. She has not been vaccinated due to her many allergies. She is working on smoking cessation-down to 1/2 pack/day. Update- Ms. Croghan continues to work on smoking cessation, down to 6 cig/day.She was recently treated for bronchitis and is feeling better(reports she has bronchitis frequently).  A referral for Pulmonology in [place] was placed by PCP-patient to call and schedule new appointment. Patient rescheduled her neurology appointment to June. A new GI referral has been placed-patient to call and schedule appointment. She would like for all appointments to be after June 3 due to school will be out and she will not have to be responsible for child care for her grandchildren. Patient was hospitalized in May for CHF and pnuemonia. She reports not smoking since her admission. She is feeling better, but has developed a bedsore. [place] Nurse is trying to extend visits. Ms. Meints has a swallow study scheduled 6/23. She reports a headache everyday after taking her morning medications.Update-Ms. Maeda will be changing PCP providers, due to gas prices and needing care closer to home. She will be attending an appointment on 7/11 at [facility] in [place]. She continues to not smoke and is feeling better. She had a swallow study today.Update-Ms. Mollenkopf was +Covid on 8/15, now with bronchitis and thrush. She is taking prednisone and magic mouthwash. Continues to report unhealed bedsore. Scheduled for EGD on 04/16/21, she will not be changing PCP due to insurance coverage. 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  Transportation barriers Food Insecurities-Has food stamps, but tends to run out of food before the end of the month Non-adherence to scheduled provider appointments-due to transportation barriers Nurse Case Manager Clinical Goal(s):  patient will verbalize understanding of plan for swallowing evaluation. -scheduled 6/23 patient will meet with RN Care Manager to address plan for sleep study to acquire new CPAP-Patient would like an in home sleep study and plans to inquire at her next neurology appointment patient will attend all scheduled medical  appointments patient will work with CM team pharmacist to review medications-Met patient will work with Care Guide to address community resources/food insecurities-Met Interventions:  Inter-disciplinary care team collaboration (see longitudinal plan of care) Discussed wound and the importance of changing positions, provided different ways to change her position, encouraged a healthy diet and increasing protein intake Advised to call [employer] [phone] for smoking cessation assistance provided by [name] Reviewed scheduled/upcoming provider appointments-advised to call today and schedule a follow up with PCP Reviewed and discussed swallow study Provided education on compression wound Advised on foods to eat to help improve thrush Provided education on thrush Patient Goals/Self-Care Activities  - call and schedule a follow up with PCP for bronchitis and bedsore - call [facility] [phone] for smoking cessation assistance provided by [facility]  - call  [phone] for transportation to medical appointments. - begin a notebook of services in my neighborhood or community - call 211 when I need some help - follow-up on any referrals for help I am given, call to schedule an appointment for ENT [phone]  Pulmonary [phone] and GI [phone] - make a list of family or friends that I can call  - develop a new routine to improve sleep - eat healthy - get at least 7 to 8 hours of sleep at night - keep room cool and dark - practice relaxation or meditation daily Follow Up Plan: Telephone follow up appointment with Managed Medicaid care management team member scheduled for:05/09/21 @ 2:30pm         Follow Up:  Patient agrees to Care Plan and Follow-up.  Plan: The Managed Medicaid care management team will reach out to the patient again over the next 30 days.  Date/time of next scheduled RN care management/care coordination outreach:  05/09/21 @ 2:30pm  Lurena Joiner RN, [place] RN Care Coordinator

## 2021-04-11 ENCOUNTER — Ambulatory Visit
Admission: RE | Admit: 2021-04-11 | Discharge: 2021-04-11 | Disposition: A | Discharge status: Home / Self Care | Payer: Medicaid Other | Source: Ambulatory Visit | Attending: Family Medicine | Admitting: Family Medicine

## 2021-04-11 ENCOUNTER — Other Ambulatory Visit: Payer: Self-pay

## 2021-04-11 ENCOUNTER — Encounter: Payer: Self-pay | Admitting: Family Medicine

## 2021-04-11 ENCOUNTER — Ambulatory Visit (INDEPENDENT_AMBULATORY_CARE_PROVIDER_SITE_OTHER): Payer: Medicaid Other | Admitting: Family Medicine

## 2021-04-11 VITALS — BP 99/52 | HR 93 | Ht 61.0 in | Wt 178.5 lb

## 2021-04-11 DIAGNOSIS — J441 Chronic obstructive pulmonary disease with (acute) exacerbation: Secondary | ICD-10-CM

## 2021-04-11 DIAGNOSIS — I5189 Other ill-defined heart diseases: Secondary | ICD-10-CM | POA: Diagnosis not present

## 2021-04-11 DIAGNOSIS — Z889 Allergy status to unspecified drugs, medicaments and biological substances status: Secondary | ICD-10-CM | POA: Diagnosis not present

## 2021-04-11 DIAGNOSIS — R059 Cough, unspecified: Secondary | ICD-10-CM | POA: Diagnosis not present

## 2021-04-11 DIAGNOSIS — J449 Chronic obstructive pulmonary disease, unspecified: Secondary | ICD-10-CM | POA: Diagnosis not present

## 2021-04-11 MED ORDER — DEXTROMETHORPHAN HBR 15 MG/5ML PO SYRP: 10.0000 mL | ORAL_SOLUTION | Freq: Four times a day (QID) | ORAL | 0 refills | Status: AC | PRN

## 2021-04-11 MED ORDER — PREDNISONE 20 MG PO TABS: 40.0000 mg | ORAL_TABLET | Freq: Every day | ORAL | 0 refills | Status: AC

## 2021-04-11 MED ORDER — CLINDAMYCIN HCL 300 MG PO CAPS: 300.0000 mg | ORAL_CAPSULE | Freq: Three times a day (TID) | ORAL | 0 refills | Status: AC

## 2021-04-11 NOTE — Progress Notes (Signed)
[name] is accompanied by [name] Sources of clinical information for visit is/are patient, relative(s), past medical records, and [facility] visit note. Nursing assessment for this office visit was reviewed with the patient for accuracy and revision.     Previous Report(s) Reviewed: [facility] visit note, May 2022 [facility] hospitalization at [facility]. Prior Labs Depression screen [facility] 2/9 04/11/2021  Decreased Interest 0  Down, Depressed, Hopeless 0  PHQ - 2 Score 0  Altered sleeping 3  Tired, decreased energy 3  Change in appetite 3  Feeling bad or failure about yourself  0  Trouble concentrating 0  Moving slowly or fidgety/restless 1  Suicidal thoughts 0  PHQ-9 Score 10  Difficult doing work/chores -  Some recent data might be hidden    Fall Risk  01/18/2021 11/12/2020 12/08/2019 11/30/2019 01/18/2019  Falls in the past year? 0 0 0 1 1  Comment - - - - -  Number falls in past yr: 0 0 0 1 1  Injury with Fall? 0 0 0 - 1  Risk Factor Category  - - - - -  Risk for fall due to : - - - - -  Risk for fall due to: Comment - - - - -  Follow up - - - - -  Comment - - - - -    PHQ9 SCORE ONLY 04/11/2021 01/31/2021 01/18/2021  PHQ-9 Total Score '10 5 14    '$ Adult vaccines due  Topic Date Due   TETANUS/TDAP  01/06/2023   PNEUMOCOCCAL POLYSACCHARIDE VACCINE AGE 37-64 HIGH RISK  Completed    Health Maintenance Due  Topic Date Due   COVID-19 Vaccine (1) Never done   Zoster Vaccines- Shingrix (1 of 2) Never done   MAMMOGRAM  11/12/2013   Pneumococcal Vaccine 41-6 Years old (2 - PCV) 04/20/2015   OPHTHALMOLOGY EXAM  12/30/2018   FOOT EXAM  11/29/2020   URINE MICROALBUMIN  11/29/2020   INFLUENZA VACCINE  03/11/2021      History/P.E. limitations: none  Adult vaccines due  Topic Date Due   TETANUS/TDAP  01/06/2023   PNEUMOCOCCAL POLYSACCHARIDE VACCINE AGE 37-64 HIGH RISK  Completed    Diabetes Health Maintenance Due  Topic Date Due   OPHTHALMOLOGY EXAM  12/30/2018   FOOT EXAM  11/29/2020    URINE MICROALBUMIN  11/29/2020   HEMOGLOBIN A1C  Discontinued    Health Maintenance Due  Topic Date Due   COVID-19 Vaccine (1) Never done   Zoster Vaccines- Shingrix (1 of 2) Never done   MAMMOGRAM  11/12/2013   Pneumococcal Vaccine 25-64 Years old (2 - PCV) 04/20/2015   OPHTHALMOLOGY EXAM  12/30/2018   FOOT EXAM  11/29/2020   URINE MICROALBUMIN  11/29/2020   INFLUENZA VACCINE  03/11/2021     Chief Complaint  Patient presents with   Cough   COPD    CPT E&M Office Visit Time Before Visit; reviewing medical records (e.g. recent visits, labs, studies): 15 minutes During Visit (F2F time): 15 minutes After Visit (discussion with family or HCP, prescribing, ordering, referring, calling result/recommendations or documenting on same day): 5 minutes Total Visit Time: 35 minutes

## 2021-04-11 NOTE — Assessment & Plan Note (Addendum)
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  She felt her throat tightening after a dose of Paxlovid.  She did not take any further doses.   Patient diagnosed with AE-COPD at [facility] 04/06/21 and treated with 5 day pulse of prednisone 40 mg daily.  No antibiotic as patient has multiple Antibiotic allergies that include most all classes of oral antibiotics.  She has been taking the prescribed prednisone. Her productive, purulent cough has progressed with more production of sputum.  Her dyspnea has not improved.  She is using her Dulera daily and her albuterol every four hours.  She has decreased smoking to 2 to 3 cigarettes a day.  PMH: Hospital Admission [facility] 12/30/20 for aspiration pneumonia.  Treated initially with Vanc/azetronam, then transitioned to oral clindamycin without allergic reaction.  Her SaO2 on home 3L/min O2 is 100%.  No tachypnea.  No fever.  No tachycardia.  She is speaking in full sentences, no increase work of breathing. Heart RRR, no gallup, no JVD Lung: faint late inspiratory and expiratory wheeze on right posterior lung field. Right basilar crackles.  Moving air well on both sides.   CXR (2V): My read show no acute abnormalities.    A/ AE-COPD P/Prednisone 40 mg daily x 5d     Clindamycin 300 mg caps, 1 caps q6h x 5d     Dextromethorphan prn cough        Stop Clinda immediately if allergy symptoms develop and go to ED.

## 2021-04-11 NOTE — Patient Instructions (Addendum)
Your lungs still sound tight with some wheezing and phlegm.  Try clindamycin antibiotic, 300 mg capsule, take one capsule every 6 hours for 5 days. If you start having any allergic reaction, stop the medication immediately.  If you feel like your thorat is closing, then please go immediately to the emergency room.  Start dextromethorophan, 10 ml eavery 6 hours for five days.  Take it regularly, even if you are not coughing at the time.   Continue for another 5 days taking the prednisone 20 mg tablet, two tablets a day.   Please go for a chest X-ray.  We need to look to see if there is any sign of pneumonia.

## 2021-04-12 ENCOUNTER — Telehealth: Payer: Self-pay

## 2021-04-12 ENCOUNTER — Other Ambulatory Visit: Payer: Medicaid Other

## 2021-04-12 DIAGNOSIS — Z889 Allergy status to unspecified drugs, medicaments and biological substances status: Secondary | ICD-10-CM | POA: Insufficient documentation

## 2021-04-12 DIAGNOSIS — I5189 Other ill-defined heart diseases: Secondary | ICD-10-CM | POA: Insufficient documentation

## 2021-04-12 NOTE — Telephone Encounter (Signed)
Please let patient know that chest xray showed bronchitis, no pneumonia or heart failure.   No change in medications recommended at this time.

## 2021-04-12 NOTE — Telephone Encounter (Signed)
Patient informed. 

## 2021-04-12 NOTE — Assessment & Plan Note (Addendum)
Patient had an transthoracic echocardiogram during hospitalization at [facility] for acute respiratory failur from multilobar pneumonia 5/22 - 01/06/21.  Patient is concerned that she was not referred to a cardiologist for the grade 1 diastolic dysfunction with normal EF.  Pulmonary edema did not appear to be a part of her acute respiratory failure.   Her husband, [name], requests that his wife consult with Dr Adora Fridge ([place]).  To this end, a referral was sent to Dr Gwenlyn Found for consultation.

## 2021-04-12 NOTE — Addendum Note (Signed)
Addended byWendy Poet, Areesha Dehaven D on: 04/12/2021 10:18 AM   Modules accepted: Orders

## 2021-04-12 NOTE — Telephone Encounter (Signed)
Patient calls nurse line requesting to speak with provider regarding CXR results from yesterday.   Please advise.   Talbot Grumbling, RN

## 2021-04-12 NOTE — Assessment & Plan Note (Signed)
Given [name] multiple antibiotic allergies, it seemed prudent that she consult with an allergist to see if there are antibiotics that the patient may be able to receive should there be a compelling health reason for their use.

## 2021-04-16 ENCOUNTER — Ambulatory Visit (HOSPITAL_COMMUNITY): Payer: Medicaid Other | Admitting: Anesthesiology

## 2021-04-16 ENCOUNTER — Ambulatory Visit (HOSPITAL_COMMUNITY)
Admission: RE | Admit: 2021-04-16 | Discharge: 2021-04-16 | Disposition: A | Discharge status: Home / Self Care | Payer: Medicaid Other | Attending: Gastroenterology | Admitting: Gastroenterology

## 2021-04-16 ENCOUNTER — Encounter (HOSPITAL_COMMUNITY): Payer: Self-pay | Admitting: Gastroenterology

## 2021-04-16 ENCOUNTER — Other Ambulatory Visit: Payer: Self-pay

## 2021-04-16 ENCOUNTER — Encounter (HOSPITAL_COMMUNITY)
Admission: RE | Disposition: A | Discharge status: Home / Self Care | Payer: Self-pay | Source: Home / Self Care | Attending: Gastroenterology

## 2021-04-16 DIAGNOSIS — J449 Chronic obstructive pulmonary disease, unspecified: Secondary | ICD-10-CM | POA: Diagnosis not present

## 2021-04-16 DIAGNOSIS — Z79899 Other long term (current) drug therapy: Secondary | ICD-10-CM | POA: Insufficient documentation

## 2021-04-16 DIAGNOSIS — F17210 Nicotine dependence, cigarettes, uncomplicated: Secondary | ICD-10-CM | POA: Insufficient documentation

## 2021-04-16 DIAGNOSIS — Z7982 Long term (current) use of aspirin: Secondary | ICD-10-CM | POA: Insufficient documentation

## 2021-04-16 DIAGNOSIS — Z9981 Dependence on supplemental oxygen: Secondary | ICD-10-CM | POA: Diagnosis not present

## 2021-04-16 DIAGNOSIS — Z8616 Personal history of COVID-19: Secondary | ICD-10-CM | POA: Diagnosis not present

## 2021-04-16 DIAGNOSIS — Z791 Long term (current) use of non-steroidal anti-inflammatories (NSAID): Secondary | ICD-10-CM | POA: Diagnosis not present

## 2021-04-16 DIAGNOSIS — Z7951 Long term (current) use of inhaled steroids: Secondary | ICD-10-CM | POA: Insufficient documentation

## 2021-04-16 DIAGNOSIS — Z880 Allergy status to penicillin: Secondary | ICD-10-CM | POA: Diagnosis not present

## 2021-04-16 DIAGNOSIS — Z888 Allergy status to other drugs, medicaments and biological substances status: Secondary | ICD-10-CM | POA: Insufficient documentation

## 2021-04-16 DIAGNOSIS — Z882 Allergy status to sulfonamides status: Secondary | ICD-10-CM | POA: Diagnosis not present

## 2021-04-16 DIAGNOSIS — R1310 Dysphagia, unspecified: Secondary | ICD-10-CM

## 2021-04-16 DIAGNOSIS — Z881 Allergy status to other antibiotic agents status: Secondary | ICD-10-CM | POA: Insufficient documentation

## 2021-04-16 DIAGNOSIS — Z9989 Dependence on other enabling machines and devices: Secondary | ICD-10-CM | POA: Diagnosis not present

## 2021-04-16 DIAGNOSIS — K2100 Gastro-esophageal reflux disease with esophagitis, without bleeding: Secondary | ICD-10-CM | POA: Insufficient documentation

## 2021-04-16 DIAGNOSIS — G4733 Obstructive sleep apnea (adult) (pediatric): Secondary | ICD-10-CM | POA: Diagnosis not present

## 2021-04-16 DIAGNOSIS — E559 Vitamin D deficiency, unspecified: Secondary | ICD-10-CM | POA: Diagnosis not present

## 2021-04-16 DIAGNOSIS — Z885 Allergy status to narcotic agent status: Secondary | ICD-10-CM | POA: Insufficient documentation

## 2021-04-16 DIAGNOSIS — K449 Diaphragmatic hernia without obstruction or gangrene: Secondary | ICD-10-CM | POA: Diagnosis not present

## 2021-04-16 DIAGNOSIS — B3781 Candidal esophagitis: Secondary | ICD-10-CM | POA: Diagnosis not present

## 2021-04-16 DIAGNOSIS — B49 Unspecified mycosis: Secondary | ICD-10-CM | POA: Diagnosis not present

## 2021-04-16 HISTORY — PX: BIOPSY: SHX5522

## 2021-04-16 HISTORY — PX: ESOPHAGOGASTRODUODENOSCOPY (EGD) WITH PROPOFOL: SHX5813

## 2021-04-16 SURGERY — ESOPHAGOGASTRODUODENOSCOPY (EGD) WITH PROPOFOL
Anesthesia: Monitor Anesthesia Care

## 2021-04-16 MED ORDER — PROPOFOL 500 MG/50ML IV EMUL
INTRAVENOUS | Status: AC
  Filled 2021-04-16: qty 50

## 2021-04-16 MED ORDER — FLUCONAZOLE 100 MG PO TABS: 100.0000 mg | ORAL_TABLET | Freq: Every day | ORAL | 0 refills | Status: DC

## 2021-04-16 MED ORDER — LIDOCAINE 2% (20 MG/ML) 5 ML SYRINGE
INTRAMUSCULAR | Status: DC | PRN
  Administered 2021-04-16: 60 mg via INTRAVENOUS

## 2021-04-16 MED ORDER — PROPOFOL 10 MG/ML IV BOLUS
INTRAVENOUS | Status: DC | PRN
  Administered 2021-04-16: 30 mg via INTRAVENOUS

## 2021-04-16 MED ORDER — PROPOFOL 1000 MG/100ML IV EMUL
INTRAVENOUS | Status: AC
  Filled 2021-04-16: qty 100

## 2021-04-16 MED ORDER — LACTATED RINGERS IV SOLN: INTRAVENOUS | Status: DC | PRN

## 2021-04-16 MED ORDER — PROPOFOL 500 MG/50ML IV EMUL
INTRAVENOUS | Status: DC | PRN
  Administered 2021-04-16: 100 ug/kg/min via INTRAVENOUS

## 2021-04-16 SURGICAL SUPPLY — 15 items

## 2021-04-16 NOTE — Op Note (Signed)
[facility] Patient Name: [name] Procedure Date: 04/16/2021 MRN: [id number] Attending MD: Mauri Pole , MD Date of Birth: [birth year] CSN: [id number] Age: 65 Admit Type: Outpatient Procedure:                Upper GI endoscopy Indications:              Dysphagia, Odynophagia Providers:                Mauri Pole, MD, Particia Nearing, RN, Isaiah Serge, CRNA Referring MD:              Medicines:                Monitored Anesthesia Care Complications:            No immediate complications. Estimated Blood Loss:     Estimated blood loss was minimal. Procedure:                Pre-Anesthesia Assessment:                           - Prior to the procedure, a History and Physical                            was performed, and patient medications and                            allergies were reviewed. The patient's tolerance of                            previous anesthesia was also reviewed. The risks                            and benefits of the procedure and the sedation                            options and risks were discussed with the patient.                            All questions were answered, and informed consent                            was obtained. Prior Anticoagulants: The patient has                            taken no previous anticoagulant or antiplatelet                            agents except for aspirin. ASA Grade Assessment: IV                            - A patient with severe systemic disease that is a  constant threat to life. After reviewing the risks                            and benefits, the patient was deemed in                            satisfactory condition to undergo the procedure.                           After obtaining informed consent, the endoscope was                            passed under direct vision. Throughout the                             procedure, the patient's blood pressure, pulse, and                            oxygen saturations were monitored continuously. The                            [id number] Olympus endoscope was introduced                            through the mouth, and advanced to the second part                            of duodenum. The upper GI endoscopy was                            accomplished without difficulty. The patient                            tolerated the procedure well. Scope In: Scope Out: Findings:      Esophagitis with no bleeding was found 26 to 38 cm from the incisors.       Biopsies were taken with a cold forceps for histology.      The Z-line was regular and was found 38 cm from the incisors.      The exam of the esophagus was otherwise normal.      The stomach was normal.      The examined duodenum was normal. 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                            Biopsied.                           - Z-line regular, 38 cm from the incisors.                           - Normal stomach.                           -  Normal examined duodenum. Moderate Sedation:      Not Applicable - Patient had care per Anesthesia. Recommendation:           - Resume previous diet.                           - Continue present medications.                           - Await pathology results.                           - Follow an antireflux regimen.                           - Diflucan (fluconazole) 100 mg PO daily for 2                            weeks.                           - Follow up with Dr.Danis as needed Procedure Code(s):        --- Professional ---                           [phone], Esophagogastroduodenoscopy, flexible,                            transoral; with biopsy, single or multiple Diagnosis Code(s):        --- Professional ---                           B37.81, Candidal esophagitis                           R13.10, Dysphagia,  unspecified CPT copyright 2019 American Medical Association. All rights reserved. The codes documented in this report are preliminary and upon coder review may  be revised to meet current compliance requirements. Mauri Pole, MD 04/16/2021 9:09:11 AM This report has been signed electronically. Number of Addenda: 0

## 2021-04-16 NOTE — H&P (Signed)
[facility] History and Physical   Primary Care Physician:  Eulis Foster, MD   Reason for Procedure:   Dysphagia  Plan:    EGD with possible esophageal dilation and biopsies     HPI: [name] is a 65 y.o. female with h/o COPD on home O2, chronic aspiration with oropharyngeal dysphagia is here for EGD to exclude esophageal component or peptic stricture  The risks and benefits as well as alternatives of endoscopic procedure(s) have been discussed and reviewed. All questions answered. The patient agrees to proceed.    Past Medical History:  Diagnosis Date   Allergic rhinitis    Arthritis    "hands and legs and feet" (04/27/2012)   Cancer ([place]) 2005   rt arm mole rem cancer   Chronic bronchitis ([place])    "keep it all the time" (04/27/2012)   COPD (chronic obstructive pulmonary disease) ([place])    COVID-19 virus infection    03-2021   Depression    PT DENIES   Diverticulosis    DJD (degenerative joint disease)    Emphysema    Epilepsy ([place])    Esophageal stricture    Exertional dyspnea    GERD (gastroesophageal reflux disease)    Glaucoma    H/O blood clots    Right leg   History of COVID-19 03/2021   Hyperlipidemia    Hypertension    IBS (irritable bowel syndrome)    Migraine    Neck pain    Cervical disc   OSA on CPAP    6-7 yrs; pt wears CPAP but has not been able to use it lately due to sinus issues   Oxygen deficiency    USES 3 LITERS 02 CONTINOUS   Pneumonia 2012; 04/27/2012   Right leg DVT ([place]) 1982   groin   Seizures ([place])    LAST SEIZURE 6 MONTHS OR  LONGER   Sleep apnea    Type II diabetes mellitus (HCC)    No meds since weight loss   UTI (urinary tract infection)    2 weeks ago   Vertigo 10/24/2014    Past Surgical History:  Procedure Laterality Date   ABDOMINAL HYSTERECTOMY  2001   "partial"   CARPAL TUNNEL RELEASE  ~ 2008   right   CATARACT EXTRACTION Bilateral    COLONOSCOPY     COLONOSCOPY WITH  ESOPHAGOGASTRODUODENOSCOPY (EGD)     KNEE ARTHROSCOPY  1990's   left   NASAL SEPTOPLASTY W/ TURBINOPLASTY Bilateral 05/23/2016   Procedure: NASAL SEPTOPLASTY WITH TURBINATE REDUCTION;  Surgeon: Jerrell Belfast, MD;  Location[facility];  Service: ENT;  Laterality: Bilateral;   SHOULDER OPEN ROTATOR CUFF REPAIR  ~ 2010   left   SINUS ENDO WITH FUSION Right 05/23/2016   Procedure: LIMITED RIGHT ENDOSCOPIC SINUS SURGERY;  Surgeon: Jerrell Belfast, MD;  Location: [facility] OR;  Service: ENT;  Laterality: Right;   TOTAL KNEE ARTHROPLASTY  08/23/2012   right   TOTAL KNEE ARTHROPLASTY  08/23/2012   Procedure: TOTAL KNEE ARTHROPLASTY;  Surgeon: Lorn Junes, MD;  Location: [place];  Service: Orthopedics;  Laterality: Right;  RIGHT KNEE ARTHROPLASTY MEDIAL AND LATERAL COMPARTMENTS WITH PATELLA RESURFACING   TUBAL LIGATION  2001   UPPER GASTROINTESTINAL ENDOSCOPY      Prior to Admission medications   Medication Sig Start Date End Date Taking? Authorizing Provider  acetaminophen (TYLENOL) 500 MG tablet Take 1,000 mg by mouth daily as needed for headache.   Yes [provider]  albuterol (PROAIR  HFA) 108 (90 Base) MCG/ACT inhaler INHALE TWO PUFFS INTO THE LUNGS EVERY 6 HOURS AS NEEDED FOR SHORTNESS OF BREATH Patient taking differently: Inhale 2 puffs into the lungs every 4 (four) hours. 12/18/20  Yes [name], [name], MD  albuterol (PROVENTIL) (2.5 MG/3ML) 0.083% nebulizer solution USE 1 VIAL IN NEBULIZER EVERY 6 HOURS AS NEEDED FOR SHORTNESS OF BREATH Patient taking differently: Take 2.5 mg by nebulization every 8 (eight) hours as needed for shortness of breath or wheezing. 01/18/21  Yes [name], [name], MD  aspirin EC 81 MG tablet Take 81 mg by mouth daily. Swallow whole.   Yes [provider]  Cholecalciferol (VITAMIN D3) 50 MCG (2000 UT) capsule TAKE ONE CAPSULE BY MOUTH DAILY 03/27/21  Yes [name], [name], MD  clindamycin (CLEOCIN) 300 MG capsule Take 1 capsule  (300 mg total) by mouth 3 (three) times daily for 5 days. 04/11/21 04/16/21 Yes McDiarmid, Blane Ohara, MD  DULERA 200-5 MCG/ACT AERO INHALE TWO PUFFS INTO THE LUNGS TWICE DAILY 01/11/21  Yes [name], [name], MD  ferrous sulfate 324 (65 Fe) MG TBEC Take 1 tablet (324 mg total) by mouth every other day. 11/12/20  Yes [name], [name], MD  fluticasone (FLONASE) 50 MCG/ACT nasal spray Place 1 spray into both nostrils daily.   Yes [provider]  gabapentin (NEURONTIN) 100 MG capsule Take 1 capsule (100 mg total) by mouth at bedtime. 11/12/20  Yes [name], [name], MD  lamoTRIgine (LAMICTAL) 200 MG tablet TAKE ONE AND ONE-HALF TABLETS BY MOUTH TWICE DAILY 03/05/21  Yes [name], NP  loratadine (CLARITIN) 10 MG tablet TAKE ONE TABLET BY MOUTH DAILY 03/11/21  Yes [name], [name], MD  meloxicam (MOBIC) 15 MG tablet Take 15 mg by mouth daily. 01/22/21  Yes [provider]  nystatin (MYCOSTATIN) 100000 UNIT/ML suspension Take 5 mLs (500,000 Units total) by mouth 4 (four) times daily. Patient taking differently: Take 500,000 Units by mouth 4 (four) times daily. Magic mouthwash 04/06/21  Yes Wurst, [place], PA-C  OXYGEN Inhale 3 L into the lungs continuous.   Yes [provider]  pantoprazole (PROTONIX) 40 MG tablet Take 1 tablet (40 mg total) by mouth 2 (two) times daily. 01/22/21  Yes Lemmon, Lavone Nian, PA  polyethylene glycol powder (GLYCOLAX/MIRALAX) powder MIX 17 GMS IN LIQUID AND DRINK ONCE DAILY Patient taking differently: Take 0.5 Containers by mouth daily as needed for mild constipation or moderate constipation. 02/26/17  Yes [name], MD  predniSONE (DELTASONE) 20 MG tablet Take 2 tablets (40 mg total) by mouth daily with breakfast for 5 days. 04/11/21 04/16/21 Yes McDiarmid, Blane Ohara, MD  QUEtiapine (SEROQUEL) 100 MG tablet TAKE ONE TABLET BY MOUTH AT BEDTIME 02/13/21  Yes [name], NP  simvastatin (ZOCOR) 40 MG tablet TAKE ONE  TABLET BY MOUTH AT BEDTIME 03/18/21  Yes [name], [name], MD  topiramate (TOPAMAX) 100 MG tablet TAKE ONE TABLET BY MOUTH EVERY MORNING AND TWO TABLETS AT BEDTIME 02/07/21  Yes [name], NP  dextromethorphan 15 MG/5ML syrup Take 10 mLs (30 mg total) by mouth 4 (four) times daily as needed for up to 5 days for cough. Patient not taking: Reported on 04/16/2021 04/11/21 04/16/21  McDiarmid, Blane Ohara, MD  omeprazole (PRILOSEC) 40 MG capsule TAKE ONE CAPSULE BY MOUTH TWICE DAILY Patient not taking: Reported on 04/16/2021 03/27/21   [name], [name], MD  sertraline (ZOLOFT) 50 MG tablet Take 1 tablet (50 mg total) by mouth daily. Patient not taking: Reported on 04/16/2021 12/21/20   [name],  [name], MD    No current facility-administered medications for this encounter.    Allergies as of 02/15/2021 - Review Complete 01/31/2021  Allergen Reaction Noted   Amoxicillin Anaphylaxis 04/25/2012   Azithromycin Swelling and Rash 06/01/2011   Cefdinir  01/12/2017   Chantix [varenicline] Other (See Comments) 08/16/2012   Doxycycline hyclate Anaphylaxis and Rash 06/19/2010   Levofloxacin Anaphylaxis 08/05/2014   Penicillins Anaphylaxis, Shortness Of Breath, and Rash 05/12/2007   Sulfonamide derivatives Shortness Of Breath, Swelling, and Rash 05/12/2007   Toradol [ketorolac tromethamine] Other (See Comments) 01/18/2019   Clarithromycin Hives 05/12/2007   Nortriptyline Swelling, Rash, and Other (See Comments) 11/30/2011   Codeine Nausea And Vomiting 10/24/2014   Fluticasone-salmeterol Other (See Comments) 02/13/2010   Triptans Rash 11/13/2015    Family History  Problem Relation Age of Onset   Diabetes Mother    Lung cancer Mother        was a smoker   Stroke Mother    Alcohol abuse Father    Lung cancer Father        was a smoker   Emphysema Father        was a smoker   Heart disease Sister    Other Sister        blood clotting disorder   Diabetes Brother    Seizures  Brother    Diabetes Maternal Aunt    Breast cancer Maternal Aunt    Esophageal cancer Maternal Uncle    Diabetes Maternal Uncle    Heart disease Maternal Grandfather    Diabetes Maternal Grandfather    Bipolar disorder Son    Muscular dystrophy Son    Heart disease Son    Heart disease Other        uncle   Colon cancer Neg Hx    Pancreatic cancer Neg Hx    Stomach cancer Neg Hx    Colon polyps Neg Hx    Rectal cancer Neg Hx     Social History   Socioeconomic History   Marital status: Married    Spouse name: [name]   Number of children: 2   Years of education: 12   Highest education level: Not on file  Occupational History   Occupation: disabled  Tobacco Use   Smoking status: Every Day    Packs/day: 1.00    Years: 40.00    Pack years: 40.00    Types: Cigarettes    Start date: 08/12/1975    Last attempt to quit: 12/30/2020    Years since quitting: 0.2   Smokeless tobacco: Former    Quit date: 08/23/2012   Tobacco comments:    1-2 CIGS A DAY NOW   Vaping Use   Vaping Use: Never used  Substance and Sexual Activity   Alcohol use: No    Alcohol/week: 0.0 standard drinks   Drug use: No   Sexual activity: Never  Other Topics Concern   Not on file  Social History Narrative   Patient lives at home with husband [name], her son and his wife.    Patient has 2 children.    Patient has a high school education.    Patient is currently unemployed.    Patient is right handed.    Patient drinks 2 cups of caffeine per day.   Social Determinants of Health   Financial Resource Strain: Not on file  Food Insecurity: No Food Insecurity   Worried About [place] in the Last Year: Never true   Ran Out  of Food in the Last Year: Never true  Transportation Needs: Unmet Transportation Needs   Lack of Transportation (Medical): Yes   Lack of Transportation (Non-Medical): Yes  Physical Activity: Not on file  Stress: Not on file  Social Connections: Not on file  Intimate Partner  Violence: Not on file    Review of Systems:  All other review of systems negative except as mentioned in the HPI.  Physical Exam: Vital signs in last 24 hours: Temp:  [97.7 F (36.5 C)] 97.7 F (36.5 C) (09/06 0718) Pulse Rate:  [85] 85 (09/06 0718) Resp:  [12] 12 (09/06 0718) BP: (124)/(97) 124/97 (09/06 0718) SpO2:  [100 %] 100 % (09/06 0718)   General:   Alert, NAD Lungs:  Clear .  Heart:  Regular rate and rhythm Abdomen:  Soft, nontender and nondistended. Neuro/Psych:  Alert and cooperative. Normal mood and affect. A and O x 3   K. Denzil Magnuson , MD [phone]

## 2021-04-16 NOTE — Anesthesia Postprocedure Evaluation (Signed)
Anesthesia Post Note  Patient: [name]  Procedure(s) Performed: ESOPHAGOGASTRODUODENOSCOPY (EGD) WITH PROPOFOL BIOPSY     Patient location during evaluation: Endoscopy Anesthesia Type: MAC Level of consciousness: awake and alert Pain management: pain level controlled Vital Signs Assessment: post-procedure vital signs reviewed and stable Respiratory status: spontaneous breathing, nonlabored ventilation and respiratory function stable Cardiovascular status: blood pressure returned to baseline and stable Postop Assessment: no apparent nausea or vomiting Anesthetic complications: no   No notable events documented.  Last Vitals:  Vitals:   04/16/21 0910 04/16/21 0920  BP: 139/82 132/62  Pulse: 80 86  Resp: 16 (!) 25  Temp:    SpO2: 100% 100%    Last Pain:  Vitals:   04/16/21 0920  TempSrc:   PainSc: 0-No pain                 Lidia Collum

## 2021-04-16 NOTE — Discharge Instructions (Signed)
YOU HAD AN ENDOSCOPIC PROCEDURE TODAY: Refer to the procedure report and other information in the discharge instructions given to you for any specific questions about what was found during the examination. If this information does not answer your questions, please call [place] office at [phone] to clarify.   YOU SHOULD EXPECT: Some feelings of bloating in the abdomen. Passage of more gas than usual. Walking can help get rid of the air that was put into your GI tract during the procedure and reduce the bloating. If you had a lower endoscopy (such as a colonoscopy or flexible sigmoidoscopy) you may notice spotting of blood in your stool or on the toilet paper. Some abdominal soreness may be present for a day or two, also.  DIET: Your first meal following the procedure should be a light meal and then it is ok to progress to your normal diet. A half-sandwich or bowl of soup is an example of a good first meal. Heavy or fried foods are harder to digest and may make you feel nauseous or bloated. Drink plenty of fluids but you should avoid alcoholic beverages for 24 hours. If you had a esophageal dilation, please see attached instructions for diet.    ACTIVITY: Your care partner should take you home directly after the procedure. You should plan to take it easy, moving slowly for the rest of the day. You can resume normal activity the day after the procedure however YOU SHOULD NOT DRIVE, use power tools, machinery or perform tasks that involve climbing or major physical exertion for 24 hours (because of the sedation medicines used during the test).   SYMPTOMS TO REPORT IMMEDIATELY: A gastroenterologist can be reached at any hour. Please call [phone]  for any of the following symptoms:   Following upper endoscopy (EGD, EUS, ERCP, esophageal dilation) Vomiting of blood or coffee ground material  New, significant abdominal pain  New, significant chest pain or pain under the shoulder blades  Painful or  persistently difficult swallowing  New shortness of breath  Black, tarry-looking or red, bloody stools  FOLLOW UP:  If any biopsies were taken you will be contacted by phone or by letter within the next 1-3 weeks. Call [phone]  if you have not heard about the biopsies in 3 weeks.  Please also call with any specific questions about appointments or follow up tests.  

## 2021-04-16 NOTE — Transfer of Care (Signed)
Immediate Anesthesia Transfer of Care Note  Patient: [name]  Procedure(s) Performed: ESOPHAGOGASTRODUODENOSCOPY (EGD) WITH PROPOFOL BIOPSY  Patient Location: PACU  Anesthesia Type:MAC  Level of Consciousness: awake, alert  and oriented  Airway & Oxygen Therapy: Patient Spontanous Breathing and Patient connected to face mask oxygen  Post-op Assessment: Report given to RN and Post -op Vital signs reviewed and stable  Post vital signs: Reviewed and stable  Last Vitals:  Vitals Value Taken Time  BP 140/70 04/16/21 0903  Temp    Pulse    Resp 0 04/16/21 0904  SpO2    Vitals shown include unvalidated device data.  Last Pain:  Vitals:   04/16/21 0718  TempSrc: Temporal  PainSc: 0-No pain         Complications: No notable events documented.

## 2021-04-16 NOTE — Anesthesia Preprocedure Evaluation (Signed)
Anesthesia Evaluation  Patient identified by MRN, date of birth, ID band Patient awake    Reviewed: Allergy & Precautions, NPO status , Patient's Chart, lab work & pertinent test results  History of Anesthesia Complications Negative for: history of anesthetic complications  Airway Mallampati: II  TM Distance: >3 FB Neck ROM: Full    Dental  (+) Edentulous Upper, Edentulous Lower   Pulmonary shortness of breath, sleep apnea , COPD,  oxygen dependent, Current Smoker and Patient abstained from smoking.,    Pulmonary exam normal        Cardiovascular hypertension, + DVT (remote)  Normal cardiovascular exam   Echo 2017: Normal LV systolic function; grade 1 diastolic dysfunction; trace MR and TR; mildly elevated pulmonary pressure.  Normal myoview 2014   Neuro/Psych Seizures -,  Anxiety Depression    GI/Hepatic Neg liver ROS, hiatal hernia, GERD  ,  Endo/Other  diabetes, Type 2  Renal/GU negative Renal ROS  negative genitourinary   Musculoskeletal  (+) Arthritis ,   Abdominal   Peds  Hematology negative hematology ROS (+)   Anesthesia Other Findings   Reproductive/Obstetrics                            Anesthesia Physical Anesthesia Plan  ASA: 4  Anesthesia Plan: MAC   Post-op Pain Management:    Induction: Intravenous  PONV Risk Score and Plan: 2 and Propofol infusion, TIVA and Treatment may vary due to age or medical condition  Airway Management Planned: Natural Airway, Nasal Cannula and Simple Face Mask  Additional Equipment: None  Intra-op Plan:   Post-operative Plan:   Informed Consent: I have reviewed the patients History and Physical, chart, labs and discussed the procedure including the risks, benefits and alternatives for the proposed anesthesia with the patient or authorized representative who has indicated his/her understanding and acceptance.       Plan Discussed  with:   Anesthesia Plan Comments:         Anesthesia Quick Evaluation

## 2021-04-17 ENCOUNTER — Encounter (HOSPITAL_COMMUNITY): Payer: Self-pay | Admitting: Gastroenterology

## 2021-04-18 LAB — SURGICAL PATHOLOGY

## 2021-04-23 ENCOUNTER — Ambulatory Visit: Payer: Medicaid Other

## 2021-04-25 ENCOUNTER — Institutional Professional Consult (permissible substitution): Payer: Medicaid Other | Admitting: Pulmonary Disease

## 2021-05-08 DIAGNOSIS — J441 Chronic obstructive pulmonary disease with (acute) exacerbation: Secondary | ICD-10-CM | POA: Diagnosis not present

## 2021-05-08 DIAGNOSIS — J449 Chronic obstructive pulmonary disease, unspecified: Secondary | ICD-10-CM | POA: Diagnosis not present

## 2021-05-08 DIAGNOSIS — J9611 Chronic respiratory failure with hypoxia: Secondary | ICD-10-CM | POA: Diagnosis not present

## 2021-05-08 DIAGNOSIS — R0902 Hypoxemia: Secondary | ICD-10-CM | POA: Diagnosis not present

## 2021-05-09 ENCOUNTER — Other Ambulatory Visit: Payer: Self-pay

## 2021-05-09 ENCOUNTER — Other Ambulatory Visit: Payer: Self-pay | Admitting: *Deleted

## 2021-05-09 NOTE — Patient Outreach (Signed)
Medicaid Managed Care   Nurse Care Manager Note  05/09/2021 Name:  [name] MRN:  [id number] DOB:  1956/02/11  [name] is an 65 y.o. year old female who is a primary patient of [name], [name], MD.  The [place] team was consulted for assistance with:    COPD  [name] was given information about [employer] team services today. [place] Patient agreed to services and verbal consent obtained.  Engaged with patient by telephone for follow up visit in response to provider referral for case management and/or care coordination services.   Assessments/Interventions:  Review of past medical history, allergies, medications, health status, including review of consultants reports, laboratory and other test data, was performed as part of comprehensive evaluation and provision of chronic care management services.  SDOH (Social Determinants of Health) assessments and interventions performed: SDOH Interventions    Flowsheet Row Most Recent Value  SDOH Interventions   Social Connections Interventions Intervention Not Indicated  Transportation Interventions Intervention Not Indicated  [Patient's husband has a vehicle now and will take her to appointments]       Care Plan  Allergies  Allergen Reactions   Amoxicillin Anaphylaxis    Breakout, mouth swells   Azithromycin Swelling and Rash    "swelling all over, high, thrush"   Cefdinir     Throat swelling after 3rd dose   Chantix [Varenicline] Other (See Comments)    "bad dreams, difficulty breathing"   Doxycycline Hyclate Anaphylaxis and Rash    swelling   Levofloxacin Anaphylaxis   Penicillins Anaphylaxis, Shortness Of Breath and Rash    "ALMOST DIED, ended up in hospital"  PCN reaction causing immediate rash, facial/tongue/throat swelling, SOB or lightheadedness with hypotension: YES PCN reaction causing severe rash involving mucus membranes or skin necrosis: NO PCN  reaction that required hospitalization YES Has patient had a PCN reaction occurring within the last 10 years: NO    Sulfonamide Derivatives Shortness Of Breath, Swelling and Rash    "break out from head to toe"   Toradol [Ketorolac Tromethamine] Other (See Comments)    Seizure    Clarithromycin Hives    swelling   Nortriptyline Swelling, Rash and Other (See Comments)    Per patient had "kidney problems" on this medication, could not use bathroom (urinary retention)   Paxlovid [Nirmatrelvir-Ritonavir] Swelling    Throat swelling   Codeine Nausea And Vomiting   Fluticasone-Salmeterol Other (See Comments)    REACTION: ulcers in mouth   Triptans Rash    Mouth breaks out and start itching    Medications Reviewed Today     Reviewed by [name], RN (Registered Nurse) on 05/09/21 at 1447  Med List Status: <None>   Medication Order Taking? Sig Documenting Provider Last Dose Status Informant  acetaminophen (TYLENOL) 500 MG tablet [id number] Yes Take 1,000 mg by mouth daily as needed for headache. [provider] Taking Active Self  albuterol (PROAIR HFA) 108 (90 Base) MCG/ACT inhaler [id number] Yes INHALE TWO PUFFS INTO THE LUNGS EVERY 6 HOURS AS NEEDED FOR SHORTNESS OF BREATH  Patient taking differently: Inhale 2 puffs into the lungs every 4 (four) hours.   [name], [name], MD Taking Active Self  albuterol (PROVENTIL) (2.5 MG/3ML) 0.083% nebulizer solution [id number] Yes USE 1 VIAL IN NEBULIZER EVERY 6 HOURS AS NEEDED FOR SHORTNESS OF BREATH  Patient taking differently: Take 2.5 mg by nebulization every 8 (eight) hours as needed for shortness of breath or wheezing.  [name], [name], MD Taking Active Self  aspirin EC 81 MG tablet [id number] Yes Take 81 mg by mouth daily. Swallow whole. [provider] Taking Active Self  Cholecalciferol (VITAMIN D3) 50 MCG (2000 UT) capsule [id number] Yes TAKE ONE CAPSULE BY MOUTH DAILY [name], [name], MD  Taking Active Self  DULERA 200-5 MCG/ACT AERO [id number] Yes INHALE TWO PUFFS INTO THE LUNGS TWICE DAILY [name], [name], MD Taking Active Self  ferrous sulfate 324 (65 Fe) MG TBEC [id number] Yes Take 1 tablet (324 mg total) by mouth every other day. [name], [name], MD Taking Active Self  fluconazole (DIFLUCAN) 100 MG tablet [id number] No Take 1 tablet (100 mg total) by mouth daily.  Patient not taking: Reported on 05/09/2021   Mauri Pole, MD Not Taking Active            Med Note (Nathali Vent A   Thu May 09, 2021  2:45 PM) completed  fluticasone (FLONASE) 50 MCG/ACT nasal spray [id number] Yes Place 1 spray into both nostrils daily. [provider] Taking Active Self  gabapentin (NEURONTIN) 100 MG capsule [id number] Yes Take 1 capsule (100 mg total) by mouth at bedtime. Eulis Foster, MD Taking Active Self           Med Note Bonnita Levan Nov 19, 2020 11:10 AM)    lamoTRIgine (LAMICTAL) 200 MG tablet [id number] Yes TAKE ONE AND ONE-HALF TABLETS BY MOUTH TWICE DAILY [name], NP Taking Active Self  loratadine (CLARITIN) 10 MG tablet [id number] Yes TAKE ONE TABLET BY MOUTH DAILY [name], [name], MD Taking Active Self  meloxicam (MOBIC) 15 MG tablet [id number] Yes Take 15 mg by mouth daily. [provider] Taking Active Self           Med Note (Tahlor Berenguer A   Tue Apr 09, 2021 11:43 AM)    nystatin (MYCOSTATIN) 100000 UNIT/ML suspension [id number] Yes Take 5 mLs (500,000 Units total) by mouth 4 (four) times daily.  Patient taking differently: Take 500,000 Units by mouth 4 (four) times daily. Magic mouthwash   [place], [place], PA-C Taking Active   omeprazole (PRILOSEC) 40 MG capsule [id number] No TAKE ONE CAPSULE BY MOUTH TWICE DAILY  Patient not taking: Reported on 05/09/2021   Eulis Foster, MD Not Taking Active   OXYGEN [id number] Yes Inhale 3 L into the lungs continuous. [provider] Taking  Active Self  pantoprazole (PROTONIX) 40 MG tablet [id number] Yes Take 1 tablet (40 mg total) by mouth 2 (two) times daily. [name], [place] Taking Active Self  polyethylene glycol powder [facility]) powder [id number] Yes MIX 17 GMS IN LIQUID AND DRINK ONCE DAILY  Patient taking differently: Take 0.5 Containers by mouth daily as needed for mild constipation or moderate constipation.   [name], MD Taking Active   QUEtiapine (SEROQUEL) 100 MG tablet [id number] Yes TAKE ONE TABLET BY MOUTH AT BEDTIME [name], NP Taking Active Self  sertraline (ZOLOFT) 50 MG tablet [id number] No Take 1 tablet (50 mg total) by mouth daily.  Patient not taking: Reported on 05/09/2021   Eulis Foster, MD Not Taking Active   simvastatin (ZOCOR) 40 MG tablet [id number] Yes TAKE ONE TABLET BY MOUTH AT BEDTIME [name], [name], MD Taking Active Self  topiramate (TOPAMAX) 100 MG tablet [id number] Yes TAKE ONE TABLET BY MOUTH EVERY MORNING AND TWO TABLETS AT BEDTIME [name], NP Taking Active Self            Patient Active Problem List  Diagnosis Date Noted   Candida infection, esophageal ([place])    Ventricular diastolic dysfunction determined by echocardiography 04/12/2021   Multiple allergies 04/12/2021   Pressure injury of ankle, stage 2 ([place]) 01/31/2021   Dehydration 01/18/2021   Aspiration pneumonia ([place]) 12/30/2020   Dysphagia 12/20/2020   Radiculopathy affecting upper extremity 11/12/2020   Encounter for wheelchair assessment 05/16/2020   Shortness of breath 07/26/2019   Chronic pain of left knee 12/06/2018   Tobacco dependence 07/02/2018   Elevated serum creatinine 06/01/2018   Weakness 05/15/2017   Chronic bronchitis ([place]) 01/14/2017   Claudication ([place]) 04/09/2016   Deviated septum 02/06/2016    Class: Chronic   Nasal turbinate hypertrophy 02/06/2016   Seizure disorder ([place]) 11/12/2015   Intractable chronic migraine without aura with  status migrainosus 05/23/2015   Rhinitis, chronic 04/25/2015   Dizziness 10/24/2014   Pulmonary hypertension ([place]) 67/59/1638   Diastolic CHF ([place]) 46/65/9935   BPPV (benign paroxysmal positional vertigo) 03/09/2013   Chronic respiratory failure ([place]) 11/01/2012   Left shoulder pain 06/27/2012   Headache 05/09/2012   Anxiety state 02/13/2010   Obstructive sleep apnea 02/19/2009   Vitamin D deficiency 01/21/2008   OSTEOPENIA 11/24/2007   Irritable bowel syndrome 11/17/2007   COPD (chronic obstructive pulmonary disease) ([place]) 09/24/2007   Hyperlipidemia 10/08/2006   OBESITY, NOS  BMI 33.8 10/08/2006   GLAUCOMA 10/08/2006   GASTROESOPHAGEAL REFLUX, NO ESOPHAGITIS 10/08/2006   HERNIA, HIATAL, NONCONGENITAL 10/08/2006   INCONTINENCE, STRESS, FEMALE 10/08/2006   DJD (degenerative joint disease) of knee 10/08/2006    Conditions to be addressed/monitored per PCP order:  COPD  Care Plan : COPD (Adult)  Updates made by [name], RN since 05/09/2021 12:00 AM     Problem: Disease Progression (COPD)      Long-Range Goal: Disease Progression Minimized or Managed   Start Date: 08/17/2020  Expected End Date: 06/11/2021  Recent Progress: On track  Priority: Medium  Note:   Current Barriers:  Chronic Disease Management support and education needs related to COPD-Ms. Bai reports difficulty swallowing solids and liquids, missed her appointment for evaluation due to lack of transportation. She is not sleeping consistently at night-lost her CPAP during a recent move. She does not want to go into the hospital for a sleep study due to fear of Covid. She has not been vaccinated due to her many allergies. She is working on smoking cessation-down to 1/2 pack/day. Update- Ms. Rawles continues to work on smoking cessation, down to 6 cig/day.She was recently treated for bronchitis and is feeling better(reports she has bronchitis frequently).  A referral for Pulmonology in [place] was placed by  PCP-patient to call and schedule new appointment. Patient rescheduled her neurology appointment to June. A new GI referral has been placed-patient to call and schedule appointment. She would like for all appointments to be after June 3 due to school will be out and she will not have to be responsible for child care for her grandchildren. Patient was hospitalized in May for CHF and pnuemonia. She reports not smoking since her admission. She is feeling better, but has developed a bedsore. [place] Nurse is trying to extend visits. Ms. Goggins has a swallow study scheduled 6/23. She reports a headache everyday after taking her morning medications.Update-Ms. Housley will be changing PCP providers, due to gas prices and needing care closer to home. She will be attending an appointment on 7/11 at [facility] in [place]. She continues to not smoke and is feeling better. She had  a swallow study today.Update-Ms. Bugay was +Covid on 8/15, now with bronchitis and thrush. She is taking prednisone and magic mouthwash. Continues to report unhealed bedsore. Scheduled for EGD on 04/16/21, she will not be changing PCP due to insurance coverage. Waiting to be scheduled with PT for shoulder pain.-Update-Ms. Ken has Sleep consult scheduled. She has transportation to appointments-her husband has transportation and will take her to future appointments. Ms. Moyd would like a "hospital table" to go over her bed for meal time.  Film/video editor.  Transportation barriers Food Insecurities-Has food stamps, but tends to run out of food before the end of the month Non-adherence to scheduled provider appointments-due to transportation barriers Nurse Case Manager Clinical Goal(s):  patient will verbalize understanding of plan for swallowing evaluation. -scheduled 6/23-MET patient will meet with RN Care Manager to address plan for sleep study to acquire new CPAP-Patient would like an in home sleep study and plans to inquire at her next  neurology appointment-MET patient will attend all scheduled medical appointments patient will work with CM team pharmacist to review medications-Met patient will work with Care Guide to address community resources/food insecurities-Met Interventions:  Inter-disciplinary care team collaboration (see longitudinal plan of care) Provided information for [employer] [phone] to inquire about a table Advised to call [facility] [phone] for smoking cessation assistance provided by [facility] Reviewed scheduled/upcoming provider appointments-patient aware and has transportation Advised on foods to eat to help improve thrush Provided education on thrush Patient Goals/Self-Care Activities - Information systems manager for needed items [phone]  - call and schedule a follow up with PCP  - call [facility] [phone] for smoking cessation assistance provided by [facility]  - call [phone] for transportation to medical appointments. - begin a notebook of services in my neighborhood or community - call 211 when I need some help - follow-up on any referrals for help I am given, call to schedule an appointment for ENT [phone]  Pulmonary [phone] and GI [phone] - make a list of family or friends that I can call  - develop a new routine to improve sleep - eat healthy - get at least 7 to 8 hours of sleep at night - keep room cool and dark - practice relaxation or meditation daily Follow Up Plan: Telephone follow up appointment with Managed Medicaid care management team member scheduled for:06/11/21 @ 3:30pm         Follow Up:  Patient agrees to Care Plan and Follow-up.  Plan: The Managed Medicaid care management team will reach out to the patient again over the next 30 days.  Date/time of next scheduled RN care management/care coordination outreach:  06/11/21 @ 3:30pm  Lurena Joiner RN, [place] RN Care Coordinator

## 2021-05-09 NOTE — Patient Instructions (Signed)
Visit Information  Ms. Gehring was given information about Medicaid Managed Care team care coordination services as a part of their [place] Medicaid benefit. [place] verbally consented to engagement with the [facility] Managed Care team.   If you are experiencing a medical emergency, please call 911 or report to your local emergency department or urgent care.   If you have a non-emergency medical problem during routine business hours, please contact your provider's office and ask to speak with a nurse.   For questions related to your [facility], please call: [phone] or visit the homepage here: [email or web address]  If you would like to schedule transportation through your [facility], please call the following number at least 2 days in advance of your appointment: [phone].   Call the [place] at [phone], at any time, 24 hours a day, 7 days a week. If you are in danger or need immediate medical attention call 911.  If you would like help to quit smoking, call 1-800-QUIT-NOW 719-831-9064) OR Espaol: 1-855-Djelo-Ya (9-163-846-6599) o para ms informacin haga clic aqu or Text READY to 200-400 to register via text  Ms. [place] - following are the goals we discussed in your visit today:   Goals Addressed             This Visit's Progress    Find Help in My Community       Timeframe:  Long-Range Goal Priority:  Medium Start Date:   08/17/20                          Expected End Date:  06/11/21                     Follow up 06/11/21  - contact [place] for needed items [phone] - call and schedule a follow up with PCP - call [employer] [phone] for smoking cessation assistance provided by [facility]  - call [phone] for transportation to medical appointments. - begin a notebook of services in my neighborhood or  community - call 211 when I need some help - follow-up on any referrals for help I am given, call to schedule an appointment for ENT [phone]  Pulmonary [phone] and GI [phone] - make a list of family or friends that I can call    Why is this important?   Knowing how and where to find help for yourself or family in your neighborhood and community is an important skill.  You will want to take some steps to learn how.          Manage Fatigue (Tiredness-COPD)       Follow up 06/11/21   - develop a new routine to improve sleep - eat healthy - get at least 7 to 8 hours of sleep at night - keep room cool and dark - practice relaxation or meditation daily    Why is this important?   Feeling tired or worn out is a common symptom of COPD (chronic obstructive pulmonary disease).  Learning when you feel your best and when you need rest is important.  Managing the tiredness (fatigue) will help you be active and enjoy life.             Please see education materials related to COPD and bronchitis provided by MyChart link.  Patient has access to MyChart and can view provided education  Telephone follow up  appointment with Managed Medicaid care management team member scheduled for:06/11/21 @ 3:30pm  Lurena Joiner RN, BSN [place] RN Care Coordinator   Following is a copy of your plan of care:  Patient Care Plan: COPD (Adult)     Problem Identified: Disease Progression (COPD)      Long-Range Goal: Disease Progression Minimized or Managed   Start Date: 08/17/2020  Expected End Date: 06/11/2021  Recent Progress: On track  Priority: Medium  Note:   Current Barriers:  Chronic Disease Management support and education needs related to COPD-Ms. Dunstan reports difficulty swallowing solids and liquids, missed her appointment for evaluation due to lack of transportation. She is not sleeping consistently at night-lost her CPAP during a recent move. She  does not want to go into the hospital for a sleep study due to fear of Covid. She has not been vaccinated due to her many allergies. She is working on smoking cessation-down to 1/2 pack/day. Update- Ms. Ohmann continues to work on smoking cessation, down to 6 cig/day.She was recently treated for bronchitis and is feeling better(reports she has bronchitis frequently).  A referral for Pulmonology in [place] was placed by PCP-patient to call and schedule new appointment. Patient rescheduled her neurology appointment to June. A new GI referral has been placed-patient to call and schedule appointment. She would like for all appointments to be after June 3 due to school will be out and she will not have to be responsible for child care for her grandchildren. Patient was hospitalized in May for CHF and pnuemonia. She reports not smoking since her admission. She is feeling better, but has developed a bedsore. [place] Nurse is trying to extend visits. Ms. Disbro has a swallow study scheduled 6/23. She reports a headache everyday after taking her morning medications.Update-Ms. Saiki will be changing PCP providers, due to gas prices and needing care closer to home. She will be attending an appointment on 7/11 at [facility] in [place]. She continues to not smoke and is feeling better. She had a swallow study today.Update-Ms. Finken was +Covid on 8/15, now with bronchitis and thrush. She is taking prednisone and magic mouthwash. Continues to report unhealed bedsore. Scheduled for EGD on 04/16/21, she will not be changing PCP due to insurance coverage. Waiting to be scheduled with PT for shoulder pain.-Update-Ms. Livecchi has Sleep consult scheduled. She has transportation to appointments-her husband has transportation and will take her to future appointments. Ms. Wenk would like a "hospital table" to go over her bed for meal time.  Film/video editor.  Transportation barriers Food Insecurities-Has food stamps, but tends to  run out of food before the end of the month Non-adherence to scheduled provider appointments-due to transportation barriers Nurse Case Manager Clinical Goal(s):  patient will verbalize understanding of plan for swallowing evaluation. -scheduled 6/23-MET patient will meet with RN Care Manager to address plan for sleep study to acquire new CPAP-Patient would like an in home sleep study and plans to inquire at her next neurology appointment-MET patient will attend all scheduled medical appointments patient will work with CM team pharmacist to review medications-Met patient will work with Care Guide to address community resources/food insecurities-Met Interventions:  Inter-disciplinary care team collaboration (see longitudinal plan of care) Provided information for [employer] [phone] to inquire about a table Advised to call [facility] [phone] for smoking cessation assistance provided by [facility] Reviewed scheduled/upcoming provider appointments-patient aware and has transportation Advised on foods to eat to help improve thrush Provided  education on thrush Patient Goals/Self-Care Activities - Information systems manager for needed items [phone]  - call and schedule a follow up with PCP  - call [facility] [phone] for smoking cessation assistance provided by [facility]  - call [phone] for transportation to medical appointments. - begin a notebook of services in my neighborhood or community - call 211 when I need some help - follow-up on any referrals for help I am given, call to schedule an appointment for ENT [phone]  Pulmonary [phone] and GI [phone] - make a list of family or friends that I can call  - develop a new routine to improve sleep - eat healthy - get at least 7 to 8 hours of sleep at night - keep room cool and dark - practice relaxation or meditation daily Follow Up Plan: Telephone follow up appointment with Managed Medicaid care management  team member scheduled for:06/11/21 @ 3:30pm        Patient Care Plan: MM Disease Management     Problem Identified: Health Promotion or Disease Self-Management (General Plan of Care)      Long-Range Goal: MM Disease Management   Start Date: 08/21/2020  Expected End Date: 10/18/2020  This Visit's Progress: On track  Priority: Medium  Note:   Current Barriers:  Patient has difficulty swallowing Patient struggles with food Has stamps but they run out by end of month Smoking cessation  Pharmacist Clinical Goal(s):  Over the next 90 days, patient will  Contact Smoking Cessation hotline through [place] from phone number  through collaboration with PharmD and provider.    Interventions: Inter-disciplinary care team collaboration (see longitudinal plan of care) Comprehensive medication review performed; medication list updated in electronic medical record    Patient Goals/Self-Care Activities Over the next 90 days, patient will:  -  Work on Smoking Cessation  Follow Up Plan: The care management team will reach out to the patient again over the next 90 days.

## 2021-05-11 DIAGNOSIS — R0902 Hypoxemia: Secondary | ICD-10-CM | POA: Diagnosis not present

## 2021-05-11 DIAGNOSIS — Z9981 Dependence on supplemental oxygen: Secondary | ICD-10-CM | POA: Diagnosis not present

## 2021-05-11 DIAGNOSIS — I959 Hypotension, unspecified: Secondary | ICD-10-CM | POA: Diagnosis not present

## 2021-05-11 DIAGNOSIS — J9811 Atelectasis: Secondary | ICD-10-CM | POA: Diagnosis not present

## 2021-05-11 DIAGNOSIS — R0689 Other abnormalities of breathing: Secondary | ICD-10-CM | POA: Diagnosis not present

## 2021-05-11 DIAGNOSIS — R0603 Acute respiratory distress: Secondary | ICD-10-CM | POA: Diagnosis not present

## 2021-05-11 DIAGNOSIS — J439 Emphysema, unspecified: Secondary | ICD-10-CM | POA: Diagnosis not present

## 2021-05-11 DIAGNOSIS — R404 Transient alteration of awareness: Secondary | ICD-10-CM | POA: Diagnosis not present

## 2021-05-11 DIAGNOSIS — J441 Chronic obstructive pulmonary disease with (acute) exacerbation: Secondary | ICD-10-CM | POA: Diagnosis not present

## 2021-05-11 DIAGNOSIS — I509 Heart failure, unspecified: Secondary | ICD-10-CM | POA: Diagnosis not present

## 2021-05-11 DIAGNOSIS — R0602 Shortness of breath: Secondary | ICD-10-CM | POA: Diagnosis not present

## 2021-05-11 DIAGNOSIS — Z20822 Contact with and (suspected) exposure to covid-19: Secondary | ICD-10-CM | POA: Diagnosis not present

## 2021-05-11 DIAGNOSIS — I700 Atherosclerosis of aorta: Secondary | ICD-10-CM | POA: Diagnosis not present

## 2021-05-11 DIAGNOSIS — N3001 Acute cystitis with hematuria: Secondary | ICD-10-CM | POA: Diagnosis not present

## 2021-05-11 DIAGNOSIS — J80 Acute respiratory distress syndrome: Secondary | ICD-10-CM | POA: Diagnosis not present

## 2021-05-11 DIAGNOSIS — J189 Pneumonia, unspecified organism: Secondary | ICD-10-CM | POA: Diagnosis not present

## 2021-05-11 DIAGNOSIS — R410 Disorientation, unspecified: Secondary | ICD-10-CM | POA: Diagnosis not present

## 2021-05-11 DIAGNOSIS — N179 Acute kidney failure, unspecified: Secondary | ICD-10-CM | POA: Diagnosis not present

## 2021-05-12 DIAGNOSIS — J441 Chronic obstructive pulmonary disease with (acute) exacerbation: Secondary | ICD-10-CM | POA: Diagnosis not present

## 2021-05-12 DIAGNOSIS — Z79899 Other long term (current) drug therapy: Secondary | ICD-10-CM | POA: Diagnosis not present

## 2021-05-12 DIAGNOSIS — I959 Hypotension, unspecified: Secondary | ICD-10-CM | POA: Diagnosis not present

## 2021-05-12 DIAGNOSIS — N3000 Acute cystitis without hematuria: Secondary | ICD-10-CM | POA: Diagnosis not present

## 2021-05-12 DIAGNOSIS — Z9981 Dependence on supplemental oxygen: Secondary | ICD-10-CM | POA: Diagnosis not present

## 2021-05-12 DIAGNOSIS — B9689 Other specified bacterial agents as the cause of diseases classified elsewhere: Secondary | ICD-10-CM | POA: Diagnosis not present

## 2021-05-12 DIAGNOSIS — G40909 Epilepsy, unspecified, not intractable, without status epilepticus: Secondary | ICD-10-CM | POA: Diagnosis not present

## 2021-05-12 DIAGNOSIS — R7881 Bacteremia: Secondary | ICD-10-CM | POA: Diagnosis not present

## 2021-05-13 DIAGNOSIS — J441 Chronic obstructive pulmonary disease with (acute) exacerbation: Secondary | ICD-10-CM | POA: Diagnosis not present

## 2021-05-13 DIAGNOSIS — R7881 Bacteremia: Secondary | ICD-10-CM | POA: Diagnosis not present

## 2021-05-13 DIAGNOSIS — G40909 Epilepsy, unspecified, not intractable, without status epilepticus: Secondary | ICD-10-CM | POA: Diagnosis not present

## 2021-05-13 DIAGNOSIS — Z9981 Dependence on supplemental oxygen: Secondary | ICD-10-CM | POA: Diagnosis not present

## 2021-05-13 DIAGNOSIS — Z7952 Long term (current) use of systemic steroids: Secondary | ICD-10-CM | POA: Diagnosis not present

## 2021-05-13 DIAGNOSIS — B9689 Other specified bacterial agents as the cause of diseases classified elsewhere: Secondary | ICD-10-CM | POA: Diagnosis not present

## 2021-05-14 ENCOUNTER — Ambulatory Visit: Payer: Medicaid Other

## 2021-05-14 DIAGNOSIS — E785 Hyperlipidemia, unspecified: Secondary | ICD-10-CM | POA: Diagnosis not present

## 2021-05-14 DIAGNOSIS — N390 Urinary tract infection, site not specified: Secondary | ICD-10-CM | POA: Diagnosis not present

## 2021-05-14 DIAGNOSIS — B9620 Unspecified Escherichia coli [E. coli] as the cause of diseases classified elsewhere: Secondary | ICD-10-CM | POA: Diagnosis not present

## 2021-05-14 DIAGNOSIS — I509 Heart failure, unspecified: Secondary | ICD-10-CM | POA: Diagnosis not present

## 2021-05-14 DIAGNOSIS — G4733 Obstructive sleep apnea (adult) (pediatric): Secondary | ICD-10-CM | POA: Diagnosis not present

## 2021-05-14 DIAGNOSIS — G40909 Epilepsy, unspecified, not intractable, without status epilepticus: Secondary | ICD-10-CM | POA: Diagnosis not present

## 2021-05-14 DIAGNOSIS — J441 Chronic obstructive pulmonary disease with (acute) exacerbation: Secondary | ICD-10-CM | POA: Diagnosis not present

## 2021-05-14 DIAGNOSIS — N179 Acute kidney failure, unspecified: Secondary | ICD-10-CM | POA: Diagnosis not present

## 2021-05-14 DIAGNOSIS — R7881 Bacteremia: Secondary | ICD-10-CM | POA: Diagnosis not present

## 2021-05-14 DIAGNOSIS — Z79899 Other long term (current) drug therapy: Secondary | ICD-10-CM | POA: Diagnosis not present

## 2021-05-14 DIAGNOSIS — Z9981 Dependence on supplemental oxygen: Secondary | ICD-10-CM | POA: Diagnosis not present

## 2021-05-14 DIAGNOSIS — J449 Chronic obstructive pulmonary disease, unspecified: Secondary | ICD-10-CM | POA: Diagnosis not present

## 2021-05-15 DIAGNOSIS — G40909 Epilepsy, unspecified, not intractable, without status epilepticus: Secondary | ICD-10-CM | POA: Diagnosis not present

## 2021-05-15 DIAGNOSIS — J9611 Chronic respiratory failure with hypoxia: Secondary | ICD-10-CM | POA: Diagnosis not present

## 2021-05-15 DIAGNOSIS — B9689 Other specified bacterial agents as the cause of diseases classified elsewhere: Secondary | ICD-10-CM | POA: Diagnosis not present

## 2021-05-15 DIAGNOSIS — R7881 Bacteremia: Secondary | ICD-10-CM | POA: Diagnosis not present

## 2021-05-15 DIAGNOSIS — I959 Hypotension, unspecified: Secondary | ICD-10-CM | POA: Diagnosis not present

## 2021-05-15 DIAGNOSIS — I5032 Chronic diastolic (congestive) heart failure: Secondary | ICD-10-CM | POA: Diagnosis not present

## 2021-05-15 DIAGNOSIS — J441 Chronic obstructive pulmonary disease with (acute) exacerbation: Secondary | ICD-10-CM | POA: Diagnosis not present

## 2021-05-15 DIAGNOSIS — E785 Hyperlipidemia, unspecified: Secondary | ICD-10-CM | POA: Diagnosis not present

## 2021-05-15 DIAGNOSIS — N3000 Acute cystitis without hematuria: Secondary | ICD-10-CM | POA: Diagnosis not present

## 2021-05-15 DIAGNOSIS — N179 Acute kidney failure, unspecified: Secondary | ICD-10-CM | POA: Diagnosis not present

## 2021-05-16 DIAGNOSIS — J9611 Chronic respiratory failure with hypoxia: Secondary | ICD-10-CM | POA: Diagnosis not present

## 2021-05-16 DIAGNOSIS — N179 Acute kidney failure, unspecified: Secondary | ICD-10-CM | POA: Diagnosis not present

## 2021-05-16 DIAGNOSIS — J189 Pneumonia, unspecified organism: Secondary | ICD-10-CM | POA: Diagnosis not present

## 2021-05-16 DIAGNOSIS — R7881 Bacteremia: Secondary | ICD-10-CM | POA: Diagnosis not present

## 2021-05-16 DIAGNOSIS — N3000 Acute cystitis without hematuria: Secondary | ICD-10-CM | POA: Diagnosis not present

## 2021-05-18 ENCOUNTER — Other Ambulatory Visit: Payer: Self-pay | Admitting: Family Medicine

## 2021-05-20 ENCOUNTER — Ambulatory Visit: Payer: Self-pay

## 2021-05-22 DIAGNOSIS — Z452 Encounter for adjustment and management of vascular access device: Secondary | ICD-10-CM | POA: Diagnosis not present

## 2021-05-23 ENCOUNTER — Telehealth: Payer: Self-pay

## 2021-05-23 DIAGNOSIS — Z452 Encounter for adjustment and management of vascular access device: Secondary | ICD-10-CM | POA: Diagnosis not present

## 2021-05-23 DIAGNOSIS — R7881 Bacteremia: Secondary | ICD-10-CM | POA: Diagnosis not present

## 2021-05-23 NOTE — Telephone Encounter (Signed)
Ailene Ravel, RN with [facility] calls nurse line with concerns regarding patient's PICC line access. Patient was seen in the ED yesterday due to concerns with PICC line leaking. Per note, it appears they thought fluid was serous drainage and that line flushed well. Patient was discharged with no concerns.   However, upon RN assessment today, reports that line is leaking when she tries to flush it. Patient had two more doses of IV abx to complete course.   Patient has follow up appointment tomorrow with Dr. Gwendlyn Deutscher. Spoke with Dr. Andria Frames (preceptor) regarding patient. Advised that if line is no longer patent, that patient should hold antibiotics until seen in clinic tomorrow.   Talbot Grumbling, RN

## 2021-05-23 NOTE — Telephone Encounter (Signed)
Patient returns call to nurse line. Advised of below.   Shantaya Bluestone C Davied Nocito, RN  

## 2021-05-23 NOTE — Telephone Encounter (Signed)
Called patient. No answer, LVM for patient to return call to office to inform of below.   Talbot Grumbling, RN

## 2021-05-23 NOTE — Telephone Encounter (Signed)
Transition Care Management Follow-up Telephone Call Date of discharge and from where: 05/22/2021 from [facility] How have you been since you were released from the hospital? Pt stated that she is feeling much better today.  Any questions or concerns? No  Items Reviewed: Did the pt receive and understand the discharge instructions provided? Yes  Medications obtained and verified? Yes  Other? No  Any new allergies since your discharge? No  Dietary orders reviewed? No Do you have support at home? Yes   Functional Questionnaire: (I = Independent and D = Dependent) ADLs: I  Bathing/Dressing- I  Meal Prep- I  Eating- I  Maintaining continence- I  Transferring/Ambulation- I  Managing Meds- I   Follow up appointments reviewed:  PCP Hospital f/u appt confirmed? Yes  Scheduled to see Andrena Mews, MD on 05/24/2021 @ 8:30am. [facility] f/u appt confirmed? No   Are transportation arrangements needed? No  If their condition worsens, is the pt aware to call PCP or go to the Emergency Dept.? Yes Was the patient provided with contact information for the PCP's office or ED? Yes Was to pt encouraged to call back with questions or concerns? Yes

## 2021-05-24 ENCOUNTER — Other Ambulatory Visit: Payer: Self-pay

## 2021-05-24 ENCOUNTER — Ambulatory Visit (INDEPENDENT_AMBULATORY_CARE_PROVIDER_SITE_OTHER): Payer: Medicaid Other | Admitting: Family Medicine

## 2021-05-24 ENCOUNTER — Encounter: Payer: Self-pay | Admitting: Family Medicine

## 2021-05-24 VITALS — BP 114/59 | HR 78 | Ht 61.0 in | Wt 188.4 lb

## 2021-05-24 DIAGNOSIS — R7881 Bacteremia: Secondary | ICD-10-CM | POA: Diagnosis not present

## 2021-05-24 DIAGNOSIS — J449 Chronic obstructive pulmonary disease, unspecified: Secondary | ICD-10-CM

## 2021-05-24 DIAGNOSIS — J9610 Chronic respiratory failure, unspecified whether with hypoxia or hypercapnia: Secondary | ICD-10-CM

## 2021-05-24 DIAGNOSIS — N179 Acute kidney failure, unspecified: Secondary | ICD-10-CM | POA: Diagnosis not present

## 2021-05-24 DIAGNOSIS — R531 Weakness: Secondary | ICD-10-CM

## 2021-05-24 NOTE — Patient Instructions (Signed)
Weakness Weakness is a lack of strength. You may feel weak all over your body (generalized), or you may feel weak in one part of your body (focal). There are many potential causes of weakness. Sometimes, the cause of your weakness may not be known. Some causes of weakness can be serious, so it isimportant to see your doctor. Follow these instructions at home: Activity Rest as needed. Try to get enough sleep. Most adults need 7-8 hours of sleep each night. Talk to your doctor about how much sleep you need each night. Do exercises, such as arm curls and leg raises, for 30 minutes at least 2 days a week or as told by your doctor. Think about working with a physical therapist or trainer to help you get stronger. General instructions  Take over-the-counter and prescription medicines only as told by your doctor. Eat a healthy, well-balanced diet. This includes: Proteins to build muscles, such as lean meats and fish. Fresh fruits and vegetables. Carbohydrates to boost energy, such as whole grains. Drink enough fluid to keep your pee (urine) pale yellow. Keep all follow-up visits as told by your doctor. This is important.  Contact a doctor if: Your weakness does not get better or it gets worse. Your weakness affects your ability to: Think clearly. Do your normal daily activities. Get help right away if you: Have sudden weakness on one side of your face or body. Have chest pain. Have trouble breathing or shortness of breath. Have problems with your vision. Have trouble talking or swallowing. Have trouble standing or walking. Are light-headed. Pass out (lose consciousness). Summary Weakness is a lack of strength. You may feel weak all over your body or just in one part of your body. There are many potential causes of weakness. Sometimes, the cause of your weakness may not be known. Rest as needed, and try to get enough sleep. Most adults need 7-8 hours of sleep each night. Eat a healthy,  well-balanced diet. This information is not intended to replace advice given to you by your health care provider. Make sure you discuss any questions you have with your healthcare provider. Document Revised: 03/03/2018 Document Reviewed: 03/03/2018 Elsevier Patient Education  2022 Elsevier Inc.  

## 2021-05-24 NOTE — Assessment & Plan Note (Signed)
This is chronic likely due to deconditioning from her multiple chronic conditions. I discussed placing home PT referral and reaching out to our referral specialist to help with this. ED precaution discussed. She agreed with the plan.

## 2021-05-24 NOTE — Assessment & Plan Note (Signed)
S/P acute respiratory failure during her recent hospitalization. She is back to her home oxygen of 1-3 L. No new concerns. Continue Dulera and albuterol as needed.

## 2021-05-24 NOTE — Progress Notes (Signed)
SUBJECTIVE:   CHIEF COMPLAINT / HPI:   Weakness:  She complained of generalized weakness and B/L leg weakness since her last hospital. She has been using a wheelchair since then. She denies any fall since her hospitalization. However, on the day of her hospitalization, she fell three times.  She endorses the feeling of tongue-tie as if her speech is not coming out clear, which occurs mainly in the morning and improves during the daytime. This, she stated, has been ongoing for years, and she had been evaluated by a neurologist for this. She denies facial asymmetry or UL weakness. Home PT was ordered from her recent hospitalization. However, she is yet to receive a call for an appointment.   Hospital F/U: The patient was recently d/ced from the hospital following an 05/11/21 admission for bacteremia from bladder source. She was sent home on IV antibiotic which she completed yesterday. Her PICC-line was pulled out at the ED yesterday. She denies any urinary symptoms, no fever, no  UTI, completed A/B, PICC out yesterday - went to the hospital yesterday for her last dose of IV A/B and her PICC line was taken out then.  COPD: Chronically on home O2 @ 3 L. She completed 3 days course of oral steroid. She is compliant with her Dulera and albuterol prn. She denies worsening of her symptoms.  PERTINENT  PMH / PSH: PMHX - Reviewed  OBJECTIVE:   BP (!) 114/59   Pulse 78   Ht 5\' 1"  (1.549 m)   Wt 188 lb 6.4 oz (85.5 kg)   SpO2 100%   BMI 35.60 kg/m   Physical Exam Vitals and nursing note reviewed.  Constitutional:      Comments: On a wheel chair with O2 Weld in place  Cardiovascular:     Rate and Rhythm: Normal rate and regular rhythm.     Heart sounds: Normal heart sounds. No murmur heard. Pulmonary:     Effort: Pulmonary effort is normal.     Breath sounds: Wheezing present.     Comments: + faint global expiratory wheeze Musculoskeletal:     Right lower leg: No edema.     Left lower  leg: No edema.  Skin:    Comments: Right arm PICC line wound site looks great with very mild redness, no swelling, no discharge or tenderness  Neurological:     General: No focal deficit present.     Mental Status: She is alert and oriented to person, place, and time.     Cranial Nerves: No cranial nerve deficit.     Sensory: Sensation is intact.     Comments: No facial asymmetry, no slurring of her speech. UL and LL  power of 4/5. Unable to assess reflexes on wheelchair     ASSESSMENT/PLAN:  Bacteremia/UTI: She was admitted to [facility] on 05/11/21 Hospital encounters were reviewed, including labs, X-rays, and CT scans. She was to complete her course on IV Ertapenem today. However, due to a dysfunctional PICC line, she went to the hospital yesterday, where she received one more IV dose of Ertapenem, and her PICC line was removed. The PICC-line wound site looked good and was redressed by me. Bmet was checked due to AKI during hospitalization, as well as CBC as an infection marker. She is otherwise doing well, and ED precautions were discussed.  Generalized weakness This is chronic likely due to deconditioning from her multiple chronic conditions. I discussed placing home PT referral and reaching out to our referral specialist  to help with this. ED precaution discussed. She agreed with the plan.  COPD (chronic obstructive pulmonary disease) (HCC) S/P acute respiratory failure during her recent hospitalization. She is back to her home oxygen of 1-3 L. No new concerns. Continue Dulera and albuterol as needed.   COVID shot recommended, but she declined.  Andrena Mews, MD [place]

## 2021-05-25 DIAGNOSIS — J189 Pneumonia, unspecified organism: Secondary | ICD-10-CM | POA: Diagnosis not present

## 2021-05-25 DIAGNOSIS — N3000 Acute cystitis without hematuria: Secondary | ICD-10-CM | POA: Diagnosis not present

## 2021-05-25 DIAGNOSIS — J9611 Chronic respiratory failure with hypoxia: Secondary | ICD-10-CM | POA: Diagnosis not present

## 2021-05-25 DIAGNOSIS — R7881 Bacteremia: Secondary | ICD-10-CM | POA: Diagnosis not present

## 2021-05-25 DIAGNOSIS — N179 Acute kidney failure, unspecified: Secondary | ICD-10-CM | POA: Diagnosis not present

## 2021-05-25 LAB — BASIC METABOLIC PANEL
BUN/Creatinine Ratio: 10 — ABNORMAL LOW (ref 12–28)
BUN: 13 mg/dL (ref 8–27)
CO2: 24 mmol/L (ref 20–29)
Calcium: 9.1 mg/dL (ref 8.7–10.3)
Chloride: 103 mmol/L (ref 96–106)
Creatinine, Ser: 1.25 mg/dL — ABNORMAL HIGH (ref 0.57–1.00)
Glucose: 90 mg/dL (ref 70–99)
Potassium: 4 mmol/L (ref 3.5–5.2)
Sodium: 142 mmol/L (ref 134–144)
eGFR: 48 mL/min/{1.73_m2} — ABNORMAL LOW (ref >59)

## 2021-05-25 LAB — CBC WITH DIFFERENTIAL/PLATELET
Basophils Absolute: 0 10*3/uL (ref 0.0–0.2)
Basos: 0 %
EOS (ABSOLUTE): 0.2 10*3/uL (ref 0.0–0.4)
Eos: 2 %
Hematocrit: 30.9 % — ABNORMAL LOW (ref 34.0–46.6)
Hemoglobin: 10.1 g/dL — ABNORMAL LOW (ref 11.1–15.9)
Immature Grans (Abs): 0 10*3/uL (ref 0.0–0.1)
Immature Granulocytes: 0 %
Lymphocytes Absolute: 1.7 10*3/uL (ref 0.7–3.1)
Lymphs: 20 %
MCH: 30.4 pg (ref 26.6–33.0)
MCHC: 32.7 g/dL (ref 31.5–35.7)
MCV: 93 fL (ref 79–97)
Monocytes Absolute: 0.6 10*3/uL (ref 0.1–0.9)
Monocytes: 7 %
Neutrophils Absolute: 6.1 10*3/uL (ref 1.4–7.0)
Neutrophils: 71 %
Platelets: 297 10*3/uL (ref 150–450)
RBC: 3.32 x10E6/uL — ABNORMAL LOW (ref 3.77–5.28)
RDW: 14.4 % (ref 11.7–15.4)
WBC: 8.6 10*3/uL (ref 3.4–10.8)

## 2021-05-27 ENCOUNTER — Telehealth: Payer: Self-pay | Admitting: Family Medicine

## 2021-05-27 NOTE — Telephone Encounter (Signed)
Test result discussed.  WBC and hgb improved compared to the one done in the hospital  Her Kidney function is about the same compared to the one done while in the hospital.  Hydration discussed and f/u with PCP in the next 2-4 weeks for recheck. She agreed with the plan.

## 2021-05-28 DIAGNOSIS — Z515 Encounter for palliative care: Secondary | ICD-10-CM | POA: Diagnosis not present

## 2021-05-28 DIAGNOSIS — Z7409 Other reduced mobility: Secondary | ICD-10-CM | POA: Diagnosis not present

## 2021-05-28 DIAGNOSIS — Z993 Dependence on wheelchair: Secondary | ICD-10-CM | POA: Diagnosis not present

## 2021-05-28 DIAGNOSIS — F0390 Unspecified dementia without behavioral disturbance: Secondary | ICD-10-CM | POA: Diagnosis not present

## 2021-06-07 ENCOUNTER — Other Ambulatory Visit: Payer: Self-pay | Admitting: Family Medicine

## 2021-06-07 DIAGNOSIS — J441 Chronic obstructive pulmonary disease with (acute) exacerbation: Secondary | ICD-10-CM | POA: Diagnosis not present

## 2021-06-07 DIAGNOSIS — J449 Chronic obstructive pulmonary disease, unspecified: Secondary | ICD-10-CM | POA: Diagnosis not present

## 2021-06-07 DIAGNOSIS — R0902 Hypoxemia: Secondary | ICD-10-CM | POA: Diagnosis not present

## 2021-06-11 ENCOUNTER — Other Ambulatory Visit: Payer: Self-pay

## 2021-06-11 ENCOUNTER — Other Ambulatory Visit: Payer: Self-pay | Admitting: *Deleted

## 2021-06-11 NOTE — Patient Instructions (Signed)
Visit Information  [name] was given information about [name] [name] Veronica coordination services as a part of their [name] benefit. [name] verbally consented to engagement with the [name].   If you are experiencing a medical emergency, please call 911 or report to your local emergency department or urgent Veronica.   If you have a non-emergency medical problem during routine business hours, please contact your provider's office and ask to speak with a nurse.   For questions related to your [facility], please call: [phone] or visit the homepage here: [email or web address]  If you would like to schedule transportation through your [name], please call the following number at least 2 days in advance of your appointment: [phone].   Call the [place] at [phone], at any time, 24 hours a day, 7 days a week. If you are in danger or need immediate medical attention call 911.  If you would like help to quit smoking, call 1-800-QUIT-NOW 717-739-1629) OR Espaol: 1-855-Djelo-Ya (2-694-854-6270) o para ms informacin haga clic aqu or Text READY to 200-400 to register via text  [name] - following are the goals we discussed in your visit today:   Goals Addressed             This Visit's Progress    Find Help in My Community       Timeframe:  Long-Range Goal Priority:  Medium Start Date:   08/17/20                          Expected End Date:  07/11/21                     Follow up 07/11/21  - contact [name] for needed items [phone] - call and schedule a follow up with PCP - call [name] [phone] for smoking cessation assistance provided by [name]  - call [phone] for transportation to medical appointments. - begin a notebook of services in my neighborhood or  community - call 211 when I need some help - follow-up on any referrals for help I am given, call to schedule an appointment for ENT [phone]  Pulmonary [phone] and GI [phone] - make a list of family or friends that I can call    Why is this important?   Knowing how and where to find help for yourself or family in your neighborhood and community is an important skill.  You will want to take some steps to learn how.          Manage Fatigue (Tiredness-COPD)       Follow up 07/11/21   - develop a new routine to improve sleep - eat healthy - get at least 7 to 8 hours of sleep at night - keep room cool and dark - practice relaxation or meditation daily    Why is this important?   Feeling tired or worn out is a common symptom of COPD (chronic obstructive pulmonary disease).  Learning when you feel your best and when you need rest is important.  Managing the tiredness (fatigue) will help you be active and enjoy life.             Please see education materials related to COPD provided by [name] link. and as Advertising account planner.   The patient verbalized understanding of instructions provided today and agreed to  receive a mailed copy of patient instruction and/or educational materials.  Telephone follow up appointment with Managed [name] Veronica management [name] member scheduled for:07/11/21 @ 3:45pm  Veronica Joiner RN, BSN [place] RN Veronica Coordinator   Following is a copy of your plan of Veronica:  Patient Veronica Plan: COPD (Adult)     Problem Identified: Disease Progression (COPD)      Long-Range Goal: Disease Progression Minimized or Managed   Start Date: 08/17/2020  Expected End Date: 07/11/2021  Recent Progress: On track  Priority: Medium  Note:   Current Barriers:  Chronic Disease Management support and education needs related to COPD-[name] reports difficulty swallowing solids and liquids, missed her appointment for evaluation due to  lack of transportation. She is not sleeping consistently at night-lost her CPAP during a recent move. She does not want to go into the hospital for a sleep study due to fear of Covid. She has not been vaccinated due to her many allergies.  A referral for Pulmonology in [place] was placed by PCP-patient to call and schedule new appointment. Patient rescheduled her neurology appointment to June. A new GI referral has been placed-patient to call and schedule appointment. She would like for all appointments to be after June 3 due to school will be out and she will not have to be responsible for child Veronica for her grandchildren. Patient was hospitalized in May for CHF and pnuemonia. She reports not smoking since her admission. She is feeling better, but has developed a bedsore. [name] has a swallow study scheduled 6/23. She reports a headache everyday after taking her morning medications. She continues to not smoke and is feeling better. She had a swallow study today. [name] was +Covid on 8/15, now with bronchitis and thrush. Scheduled for EGD on 04/16/21. Waiting to be scheduled with PT for shoulder pain. [name] would like a "hospital table" to go over her bed for meal time. -Update-[name] has several upcoming appointments that we reviewed today. Her husband will take her to these appointments. The thrush has improved and she is feeling better. She would like an update on PT referral placed by PCP. Denies any needs at this time. [name].  Transportation barriers Food Insecurities-Has food stamps, but tends to run out of food before the end of the month Non-adherence to scheduled provider appointments-due to transportation barriers Nurse Case Manager Clinical Goal(s):  patient will verbalize understanding of plan for swallowing evaluation. -scheduled 6/23-MET patient will meet with RN Veronica Manager to address plan for sleep study to acquire new CPAP-Patient would like an in home sleep study and  plans to inquire at her next neurology appointment-MET patient will attend all scheduled medical appointments patient will work with CM [name] pharmacist to review medications-Met patient will work with [name] to address community resources/food insecurities-Met Interventions:  Inter-disciplinary [name] collaboration (see longitudinal plan of Veronica) Provided information for [employer] [phone] to inquire about a table Advised to call [facility] [phone] for smoking cessation assistance provided by [facility] Reviewed scheduled/upcoming provider appointments-11/9 with Pulmonology, 11/16 with Asthma and Allergy and 11/29 with Cardiology Encouraged patient to continue to cut back on smoking Discussed referral to PT, explained that she was accepted by [facility] on 10/31 and she should be contacted for scheduling SDOH assessment Provided education COPD eating plan Patient Goals/Self-Veronica Activities - attend all scheduled appointments:11/9 with Pulmonology, 11/16 with Asthma and Allergy and 11/29 with Cardiology - contact Senior  Veronica [place] for needed items [phone]  - call and schedule a follow up with PCP  - call [facility] [phone] for smoking cessation assistance provided by [facility]  - call [phone] for transportation to medical appointments. - begin a notebook of services in my neighborhood or community - call 211 when I need some help - follow-up on any referrals for help I am given, call to schedule an appointment for ENT [phone]  Pulmonary [phone] and GI [phone] - make a list of family or friends that I can call  - develop a new routine to improve sleep - eat healthy - get at least 7 to 8 hours of sleep at night - keep room cool and dark - practice relaxation or meditation daily Follow Up Plan: Telephone follow up appointment with Managed [name] Veronica management [name] member scheduled for:07/11/21 @ 3:45pm        Patient Veronica Plan:  MM Disease Management     Problem Identified: Health Promotion or Disease Self-Management (General Plan of Veronica)      Long-Range Goal: MM Disease Management   Start Date: 08/21/2020  Expected End Date: 10/18/2020  This Visit's Progress: On track  Priority: Medium  Note:   Current Barriers:  Patient has difficulty swallowing Patient struggles with food Has stamps but they run out by end of month Smoking cessation  Pharmacist Clinical Goal(s):  Over the next 90 days, patient will  Contact Smoking Cessation hotline through [place] from phone number  through collaboration with PharmD and provider.    Interventions: Inter-disciplinary [name] collaboration (see longitudinal plan of Veronica) Comprehensive medication review performed; medication list updated in electronic medical record    Patient Goals/Self-Veronica Activities Over the next 90 days, patient will:  -  Work on Smoking Cessation  Follow Up Plan: The Veronica management [name] will reach out to the patient again over the next 90 days.

## 2021-06-11 NOTE — Patient Outreach (Addendum)
Medicaid Managed Care   Nurse Care Manager Note  06/11/2021 Name:  [name] MRN:  [id number] DOB:  1956-07-06  [name] is an 64 y.o. year old female who is a primary patient of [name], [name], MD.  The [facility] team was consulted for assistance with:    COPD  [name] was given information about [employer] team services today. [place] Patient agreed to services and verbal consent obtained.  Engaged with patient by telephone for follow up visit in response to provider referral for case management and/or care coordination services.   Assessments/Interventions:  Review of past medical history, allergies, medications, health status, including review of consultants reports, laboratory and other test data, was performed as part of comprehensive evaluation and provision of chronic care management services.  SDOH (Social Determinants of Health) assessments and interventions performed: SDOH Interventions    Flowsheet Row Most Recent Value  SDOH Interventions   Food Insecurity Interventions Intervention Not Indicated  Housing Interventions Intervention Not Indicated  Transportation Interventions Intervention Not Indicated  [Patient's husband has a vehicle and can provide transportation]       Care Plan  Allergies  Allergen Reactions   Amoxicillin Anaphylaxis    Breakout, mouth swells   Azithromycin Swelling and Rash    "swelling all over, high, thrush"   Cefdinir     Throat swelling after 3rd dose   Chantix [Varenicline] Other (See Comments)    "bad dreams, difficulty breathing"   Doxycycline Hyclate Anaphylaxis and Rash    swelling   Levofloxacin Anaphylaxis   Penicillins Anaphylaxis, Shortness Of Breath and Rash    "ALMOST DIED, ended up in hospital"  PCN reaction causing immediate rash, facial/tongue/throat swelling, SOB or lightheadedness with hypotension: YES PCN reaction causing severe rash involving mucus  membranes or skin necrosis: NO PCN reaction that required hospitalization YES Has patient had a PCN reaction occurring within the last 10 years: NO    Sulfonamide Derivatives Shortness Of Breath, Swelling and Rash    "break out from head to toe"   Toradol [Ketorolac Tromethamine] Other (See Comments)    Seizure    Clarithromycin Hives    swelling   Nortriptyline Swelling, Rash and Other (See Comments)    Per patient had "kidney problems" on this medication, could not use bathroom (urinary retention)   Paxlovid [Nirmatrelvir-Ritonavir] Swelling    Throat swelling   Codeine Nausea And Vomiting   Fluticasone-Salmeterol Other (See Comments)    REACTION: ulcers in mouth   Triptans Rash    Mouth breaks out and start itching    Medications Reviewed Today     Reviewed by Kinnie Feil, MD (Physician) on 05/24/21 at [phone]  Med List Status: <None>   Medication Order Taking? Sig Documenting Provider Last Dose Status Informant  acetaminophen (TYLENOL) 500 MG tablet [id number] No Take 1,000 mg by mouth daily as needed for headache.  Patient not taking: Reported on 05/24/2021   [provider] Not Taking Active Self  albuterol (PROAIR HFA) 108 (90 Base) MCG/ACT inhaler [id number] No INHALE TWO PUFFS INTO THE LUNGS EVERY 6 HOURS AS NEEDED FOR SHORTNESS OF BREATH  Patient not taking: Reported on 05/24/2021   [name], [name], MD Not Taking Active Self  albuterol (PROVENTIL) (2.5 MG/3ML) 0.083% nebulizer solution [id number] No USE 1 VIAL IN NEBULIZER EVERY 6 HOURS AS NEEDED FOR SHORTNESS OF BREATH  Patient not taking: Reported on 05/24/2021   Eulis Foster, MD Not Taking Active Self  aspirin EC 81 MG tablet [id number] Yes Take 81 mg by mouth daily. Swallow whole. [provider] Taking Active Self  Cholecalciferol (VITAMIN D3) 50 MCG (2000 UT) capsule [id number] Yes TAKE ONE CAPSULE BY MOUTH DAILY [name], [name], MD Taking Active Self  DULERA 200-5  MCG/ACT AERO [id number] Yes INHALE TWO PUFFS INTO THE LUNGS TWICE DAILY [name], [name], MD Taking Active Self  ferrous sulfate 324 (65 Fe) MG TBEC [id number] Yes TAKE ONE TABLET BY MOUTH EVERY OTHER DAY [name], [name], MD Taking Active   fluticasone (FLONASE) 50 MCG/ACT nasal spray [id number] No Place 1 spray into both nostrils daily.  Patient not taking: Reported on 05/24/2021   [provider] Not Taking Active Self  gabapentin (NEURONTIN) 100 MG capsule [id number] No Take 1 capsule (100 mg total) by mouth at bedtime.  Patient not taking: Reported on 05/24/2021   Eulis Foster, MD Not Taking Active Self           Med Note Bonnita Levan Nov 19, 2020 11:10 AM)    lamoTRIgine (LAMICTAL) 200 MG tablet [id number] Yes TAKE ONE AND ONE-HALF TABLETS BY MOUTH TWICE DAILY [name], NP Taking Active Self  loratadine (CLARITIN) 10 MG tablet [id number] Yes TAKE ONE TABLET BY MOUTH DAILY [name], [name], MD Taking Active Self  meloxicam (MOBIC) 15 MG tablet [id number] Yes Take 15 mg by mouth daily. [provider] Taking Active Self           Med Note (Kiandra Sanguinetti A   Tue Apr 09, 2021 11:43 AM)    nystatin (MYCOSTATIN) 100000 UNIT/ML suspension [id number] No Take 5 mLs (500,000 Units total) by mouth 4 (four) times daily.  Patient not taking: Reported on 05/24/2021   Lestine Box, PA-C Not Taking Active   omeprazole (PRILOSEC) 40 MG capsule [id number] Yes TAKE ONE CAPSULE BY MOUTH TWICE DAILY [name], [name], MD Taking Active   OXYGEN [id number] Yes Inhale 3 L into the lungs continuous. [provider] Taking Active Self  pantoprazole (PROTONIX) 40 MG tablet [id number] No Take 1 tablet (40 mg total) by mouth 2 (two) times daily. [name], [place] Unknown Active Self  polyethylene glycol powder (GLYCOLAX/MIRALAX) powder [id number] No MIX 17 GMS IN LIQUID AND DRINK ONCE DAILY  Patient not taking: Reported on  05/24/2021   [name], MD Not Taking Active   QUEtiapine (SEROQUEL) 100 MG tablet [id number] Yes TAKE ONE TABLET BY MOUTH AT BEDTIME [name], NP Taking Active Self  sertraline (ZOLOFT) 50 MG tablet [id number] Yes Take 1 tablet (50 mg total) by mouth daily. [name], [name], MD Taking Active   simvastatin (ZOCOR) 40 MG tablet [id number] Yes TAKE ONE TABLET BY MOUTH AT BEDTIME [name], [name], MD Taking Active Self  topiramate (TOPAMAX) 100 MG tablet [id number] Yes TAKE ONE TABLET BY MOUTH EVERY MORNING AND TWO TABLETS AT BEDTIME [name], NP Taking Active Self            Patient Active Problem List   Diagnosis Date Noted   Candida infection, esophageal ([place])    Ventricular diastolic dysfunction determined by echocardiography 04/12/2021   Multiple allergies 04/12/2021   Pressure injury of ankle, stage 2 ([place]) 01/31/2021   Dehydration 01/18/2021   Aspiration pneumonia ([place]) 12/30/2020   Dysphagia 12/20/2020   Radiculopathy affecting upper extremity 11/12/2020   Encounter for wheelchair assessment 05/16/2020   Shortness of breath 07/26/2019   Chronic pain of left knee 12/06/2018   Tobacco dependence 07/02/2018   Elevated  serum creatinine 06/01/2018   Generalized weakness 05/15/2017   Chronic bronchitis ([place]) 01/14/2017   Claudication ([place]) 04/09/2016   Deviated septum 02/06/2016    Class: Chronic   Nasal turbinate hypertrophy 02/06/2016   Seizure disorder ([place]) 11/12/2015   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 05/23/2015   Rhinitis, chronic 04/25/2015   Dizziness 10/24/2014   Pulmonary hypertension ([place]) 86/75/4492   Diastolic CHF ([place]) 01/00/7121   BPPV (benign paroxysmal positional vertigo) 03/09/2013   Chronic respiratory failure ([place]) 11/01/2012   Left shoulder pain 06/27/2012   Headache 05/09/2012   Anxiety state 02/13/2010   Obstructive sleep apnea 02/19/2009   Vitamin D deficiency 01/21/2008    OSTEOPENIA 11/24/2007   Irritable bowel syndrome 11/17/2007   COPD (chronic obstructive pulmonary disease) ([place]) 09/24/2007   Hyperlipidemia 10/08/2006   OBESITY, NOS  BMI 33.8 10/08/2006   GLAUCOMA 10/08/2006   GASTROESOPHAGEAL REFLUX, NO ESOPHAGITIS 10/08/2006   HERNIA, HIATAL, NONCONGENITAL 10/08/2006   INCONTINENCE, STRESS, FEMALE 10/08/2006   DJD (degenerative joint disease) of knee 10/08/2006    Conditions to be addressed/monitored per PCP order:  COPD  Care Plan : COPD (Adult)  Updates made by [name], RN since 06/11/2021 12:00 AM     Problem: Disease Progression (COPD)      Long-Range Goal: Disease Progression Minimized or Managed   Start Date: 08/17/2020  Expected End Date: 07/11/2021  Recent Progress: On track  Priority: Medium  Note:   Current Barriers:  Chronic Disease Management support and education needs related to COPD-Ms. Vachon reports difficulty swallowing solids and liquids, missed her appointment for evaluation due to lack of transportation. She is not sleeping consistently at night-lost her CPAP during a recent move. She does not want to go into the hospital for a sleep study due to fear of Covid. She has not been vaccinated due to her many allergies.  A referral for Pulmonology in [place] was placed by PCP-patient to call and schedule new appointment. Patient rescheduled her neurology appointment to June. A new GI referral has been placed-patient to call and schedule appointment. She would like for all appointments to be after June 3 due to school will be out and she will not have to be responsible for child care for her grandchildren. Patient was hospitalized in May for CHF and pnuemonia. She reports not smoking since her admission. She is feeling better, but has developed a bedsore. Ms. Yoshino has a swallow study scheduled 6/23. She reports a headache everyday after taking her morning medications. She continues to not smoke and is feeling better. She had a  swallow study today. Ms. Gingerich was +Covid on 8/15, now with bronchitis and thrush. Scheduled for EGD on 04/16/21. Waiting to be scheduled with PT for shoulder pain. Ms. Odonnel would like a "hospital table" to go over her bed for meal time. -Update-Ms. Aponte has several upcoming appointments that we reviewed today. Her husband will take her to these appointments. The thrush has improved and she is feeling better. She would like an update on PT referral placed by PCP. 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  Transportation barriers Food Insecurities-Has food stamps, but tends to run out of food before the end of the month Non-adherence to scheduled provider appointments-due to transportation barriers Nurse Case Manager Clinical Goal(s):  patient will verbalize understanding of plan for swallowing evaluation. -scheduled 6/23-MET patient will meet with RN Care Manager to address plan for sleep study to acquire new CPAP-Patient would like an in  home sleep study and plans to inquire at her next neurology appointment-MET patient will attend all scheduled medical appointments patient will work with CM team pharmacist to review medications-Met patient will work with Care Guide to address community resources/food insecurities-Met Interventions:  Inter-disciplinary care team collaboration (see longitudinal plan of care) Provided information for [place] [phone] to inquire about a table Advised to call [facility] [phone] for smoking cessation assistance provided by [facility] Reviewed scheduled/upcoming provider appointments-11/9 with Pulmonology, 11/16 with Asthma and Allergy and 11/29 with Cardiology Encouraged patient to continue to cut back on smoking Discussed referral to PT, explained that she was accepted by [facility] on 10/31 and she should be contacted for scheduling SDOH assessment Provided education COPD eating plan Patient Goals/Self-Care Activities - attend all  scheduled appointments:11/9 with Pulmonology, 11/16 with Asthma and Allergy and 11/29 with Cardiology - contact [facility] for needed items [phone]  - call and schedule a follow up with PCP  - call [facility] [phone] for smoking cessation assistance provided by [facility]  - call [phone] for transportation to medical appointments. - begin a notebook of services in my neighborhood or community - call 211 when I need some help - follow-up on any referrals for help I am given, call to schedule an appointment for ENT [phone]  Pulmonary [phone] and GI [phone] - make a list of family or friends that I can call  - develop a new routine to improve sleep - eat healthy - get at least 7 to 8 hours of sleep at night - keep room cool and dark - practice relaxation or meditation daily Follow Up Plan: Telephone follow up appointment with Managed Medicaid care management team member scheduled for:07/11/21 @ 3:45pm         Follow Up:  Patient agrees to Care Plan and Follow-up.  Plan: The Managed Medicaid care management team will reach out to the patient again over the next 30 days.  Date/time of next scheduled RN care management/care coordination outreach:  07/11/21 @ 3:45pm  Lurena Joiner RN, [place] RN Care Coordinator

## 2021-06-12 DIAGNOSIS — J449 Chronic obstructive pulmonary disease, unspecified: Secondary | ICD-10-CM | POA: Diagnosis not present

## 2021-06-15 ENCOUNTER — Other Ambulatory Visit: Payer: Self-pay | Admitting: Family Medicine

## 2021-06-15 DIAGNOSIS — J449 Chronic obstructive pulmonary disease, unspecified: Secondary | ICD-10-CM | POA: Diagnosis not present

## 2021-06-15 DIAGNOSIS — J9601 Acute respiratory failure with hypoxia: Secondary | ICD-10-CM | POA: Diagnosis not present

## 2021-06-15 DIAGNOSIS — D631 Anemia in chronic kidney disease: Secondary | ICD-10-CM | POA: Diagnosis not present

## 2021-06-15 DIAGNOSIS — G40909 Epilepsy, unspecified, not intractable, without status epilepticus: Secondary | ICD-10-CM | POA: Diagnosis not present

## 2021-06-15 DIAGNOSIS — R0600 Dyspnea, unspecified: Secondary | ICD-10-CM | POA: Diagnosis not present

## 2021-06-15 DIAGNOSIS — R062 Wheezing: Secondary | ICD-10-CM | POA: Diagnosis not present

## 2021-06-15 DIAGNOSIS — I5033 Acute on chronic diastolic (congestive) heart failure: Secondary | ICD-10-CM | POA: Diagnosis not present

## 2021-06-15 DIAGNOSIS — R0902 Hypoxemia: Secondary | ICD-10-CM | POA: Diagnosis not present

## 2021-06-15 DIAGNOSIS — J9611 Chronic respiratory failure with hypoxia: Secondary | ICD-10-CM | POA: Diagnosis not present

## 2021-06-15 DIAGNOSIS — Z79899 Other long term (current) drug therapy: Secondary | ICD-10-CM | POA: Diagnosis not present

## 2021-06-15 DIAGNOSIS — N3000 Acute cystitis without hematuria: Secondary | ICD-10-CM | POA: Diagnosis not present

## 2021-06-15 DIAGNOSIS — R000 Tachycardia, unspecified: Secondary | ICD-10-CM | POA: Diagnosis not present

## 2021-06-15 DIAGNOSIS — J441 Chronic obstructive pulmonary disease with (acute) exacerbation: Secondary | ICD-10-CM | POA: Diagnosis not present

## 2021-06-15 DIAGNOSIS — Z20822 Contact with and (suspected) exposure to covid-19: Secondary | ICD-10-CM | POA: Diagnosis not present

## 2021-06-15 DIAGNOSIS — I959 Hypotension, unspecified: Secondary | ICD-10-CM | POA: Diagnosis not present

## 2021-06-15 DIAGNOSIS — E785 Hyperlipidemia, unspecified: Secondary | ICD-10-CM | POA: Diagnosis not present

## 2021-06-15 DIAGNOSIS — A419 Sepsis, unspecified organism: Secondary | ICD-10-CM | POA: Diagnosis not present

## 2021-06-15 DIAGNOSIS — I517 Cardiomegaly: Secondary | ICD-10-CM | POA: Diagnosis not present

## 2021-06-15 DIAGNOSIS — N179 Acute kidney failure, unspecified: Secondary | ICD-10-CM | POA: Diagnosis not present

## 2021-06-15 DIAGNOSIS — Z720 Tobacco use: Secondary | ICD-10-CM | POA: Diagnosis not present

## 2021-06-16 DIAGNOSIS — E785 Hyperlipidemia, unspecified: Secondary | ICD-10-CM | POA: Diagnosis not present

## 2021-06-16 DIAGNOSIS — N3000 Acute cystitis without hematuria: Secondary | ICD-10-CM | POA: Diagnosis not present

## 2021-06-16 DIAGNOSIS — Z9981 Dependence on supplemental oxygen: Secondary | ICD-10-CM | POA: Diagnosis not present

## 2021-06-16 DIAGNOSIS — I272 Pulmonary hypertension, unspecified: Secondary | ICD-10-CM | POA: Diagnosis not present

## 2021-06-16 DIAGNOSIS — Z79899 Other long term (current) drug therapy: Secondary | ICD-10-CM | POA: Diagnosis not present

## 2021-06-16 DIAGNOSIS — I5032 Chronic diastolic (congestive) heart failure: Secondary | ICD-10-CM | POA: Diagnosis not present

## 2021-06-16 DIAGNOSIS — J441 Chronic obstructive pulmonary disease with (acute) exacerbation: Secondary | ICD-10-CM | POA: Diagnosis not present

## 2021-06-16 DIAGNOSIS — G40909 Epilepsy, unspecified, not intractable, without status epilepticus: Secondary | ICD-10-CM | POA: Diagnosis not present

## 2021-06-17 ENCOUNTER — Ambulatory Visit: Payer: Medicaid Other

## 2021-06-17 DIAGNOSIS — I272 Pulmonary hypertension, unspecified: Secondary | ICD-10-CM | POA: Diagnosis not present

## 2021-06-17 DIAGNOSIS — Z79899 Other long term (current) drug therapy: Secondary | ICD-10-CM | POA: Diagnosis not present

## 2021-06-17 DIAGNOSIS — E785 Hyperlipidemia, unspecified: Secondary | ICD-10-CM | POA: Diagnosis not present

## 2021-06-17 DIAGNOSIS — B9620 Unspecified Escherichia coli [E. coli] as the cause of diseases classified elsewhere: Secondary | ICD-10-CM | POA: Diagnosis not present

## 2021-06-17 DIAGNOSIS — G40909 Epilepsy, unspecified, not intractable, without status epilepticus: Secondary | ICD-10-CM | POA: Diagnosis not present

## 2021-06-17 DIAGNOSIS — R7881 Bacteremia: Secondary | ICD-10-CM | POA: Diagnosis not present

## 2021-06-17 DIAGNOSIS — J441 Chronic obstructive pulmonary disease with (acute) exacerbation: Secondary | ICD-10-CM | POA: Diagnosis not present

## 2021-06-17 DIAGNOSIS — N3000 Acute cystitis without hematuria: Secondary | ICD-10-CM | POA: Diagnosis not present

## 2021-06-17 DIAGNOSIS — Z9981 Dependence on supplemental oxygen: Secondary | ICD-10-CM | POA: Diagnosis not present

## 2021-06-17 DIAGNOSIS — I5032 Chronic diastolic (congestive) heart failure: Secondary | ICD-10-CM | POA: Diagnosis not present

## 2021-06-18 DIAGNOSIS — G40909 Epilepsy, unspecified, not intractable, without status epilepticus: Secondary | ICD-10-CM | POA: Diagnosis not present

## 2021-06-18 DIAGNOSIS — J441 Chronic obstructive pulmonary disease with (acute) exacerbation: Secondary | ICD-10-CM | POA: Diagnosis not present

## 2021-06-18 DIAGNOSIS — R7881 Bacteremia: Secondary | ICD-10-CM | POA: Diagnosis not present

## 2021-06-18 DIAGNOSIS — B9689 Other specified bacterial agents as the cause of diseases classified elsewhere: Secondary | ICD-10-CM | POA: Diagnosis not present

## 2021-06-18 DIAGNOSIS — N390 Urinary tract infection, site not specified: Secondary | ICD-10-CM | POA: Diagnosis not present

## 2021-06-18 DIAGNOSIS — I272 Pulmonary hypertension, unspecified: Secondary | ICD-10-CM | POA: Diagnosis not present

## 2021-06-18 DIAGNOSIS — Z9981 Dependence on supplemental oxygen: Secondary | ICD-10-CM | POA: Diagnosis not present

## 2021-06-18 DIAGNOSIS — I5032 Chronic diastolic (congestive) heart failure: Secondary | ICD-10-CM | POA: Diagnosis not present

## 2021-06-18 DIAGNOSIS — Z881 Allergy status to other antibiotic agents status: Secondary | ICD-10-CM | POA: Diagnosis not present

## 2021-06-19 ENCOUNTER — Ambulatory Visit: Payer: Medicaid Other | Admitting: Allergy & Immunology

## 2021-06-19 ENCOUNTER — Institutional Professional Consult (permissible substitution): Payer: Medicaid Other | Admitting: Pulmonary Disease

## 2021-06-19 ENCOUNTER — Telehealth: Payer: Self-pay

## 2021-06-19 DIAGNOSIS — J441 Chronic obstructive pulmonary disease with (acute) exacerbation: Secondary | ICD-10-CM | POA: Diagnosis not present

## 2021-06-19 DIAGNOSIS — F411 Generalized anxiety disorder: Secondary | ICD-10-CM | POA: Diagnosis not present

## 2021-06-19 DIAGNOSIS — I272 Pulmonary hypertension, unspecified: Secondary | ICD-10-CM | POA: Diagnosis not present

## 2021-06-19 DIAGNOSIS — A4151 Sepsis due to Escherichia coli [E. coli]: Secondary | ICD-10-CM | POA: Diagnosis not present

## 2021-06-19 DIAGNOSIS — N3000 Acute cystitis without hematuria: Secondary | ICD-10-CM | POA: Diagnosis not present

## 2021-06-19 DIAGNOSIS — I5032 Chronic diastolic (congestive) heart failure: Secondary | ICD-10-CM | POA: Diagnosis not present

## 2021-06-19 DIAGNOSIS — R7881 Bacteremia: Secondary | ICD-10-CM | POA: Diagnosis not present

## 2021-06-19 DIAGNOSIS — J9611 Chronic respiratory failure with hypoxia: Secondary | ICD-10-CM | POA: Diagnosis not present

## 2021-06-19 DIAGNOSIS — G40909 Epilepsy, unspecified, not intractable, without status epilepticus: Secondary | ICD-10-CM | POA: Diagnosis not present

## 2021-06-19 DIAGNOSIS — E785 Hyperlipidemia, unspecified: Secondary | ICD-10-CM | POA: Diagnosis not present

## 2021-06-19 DIAGNOSIS — N179 Acute kidney failure, unspecified: Secondary | ICD-10-CM | POA: Diagnosis not present

## 2021-06-19 DIAGNOSIS — N182 Chronic kidney disease, stage 2 (mild): Secondary | ICD-10-CM | POA: Diagnosis not present

## 2021-06-19 DIAGNOSIS — J189 Pneumonia, unspecified organism: Secondary | ICD-10-CM | POA: Diagnosis not present

## 2021-06-19 NOTE — Telephone Encounter (Signed)
Remo Lipps from [facility] calling for PT evaluation verbal orders.  Verbal orders given per [facility] protocol  Talbot Grumbling, RN

## 2021-06-20 ENCOUNTER — Telehealth: Payer: Self-pay

## 2021-06-20 NOTE — Telephone Encounter (Signed)
Transition Care Management Follow-up Telephone Call Date of discharge and from where: 06/19/2021 from [facility] How have you been since you were released from the hospital? Pt stated that she is feeling much better and did not have any questions or concerns at this time.  Any questions or concerns? No  Items Reviewed: Did the pt receive and understand the discharge instructions provided? Yes  Medications obtained and verified? Yes  Other? No  Any new allergies since your discharge? No  Dietary orders reviewed? No Do you have support at home? Yes   Functional Questionnaire: (I = Independent and D = Dependent) ADLs: I  Bathing/Dressing- I  Meal Prep- I  Eating- I  Maintaining continence- I  Transferring/Ambulation- I  Managing Meds- I   Follow up appointments reviewed:  PCP Hospital f/u appt confirmed? No   Specialist Hospital f/u appt confirmed? Yes  Scheduled to see Allergy and Asthma on 06/26/2021 @ 9:30am. Are transportation arrangements needed? No  If their condition worsens, is the pt aware to call PCP or go to the Emergency Dept.? Yes Was the patient provided with contact information for the PCP's office or ED? Yes Was to pt encouraged to call back with questions or concerns? Yes

## 2021-06-23 ENCOUNTER — Other Ambulatory Visit: Payer: Self-pay | Admitting: Family Medicine

## 2021-06-23 ENCOUNTER — Other Ambulatory Visit: Payer: Self-pay | Admitting: Neurology

## 2021-06-26 ENCOUNTER — Ambulatory Visit: Payer: Medicaid Other | Admitting: Allergy & Immunology

## 2021-06-27 ENCOUNTER — Telehealth: Payer: Self-pay

## 2021-06-27 DIAGNOSIS — N390 Urinary tract infection, site not specified: Secondary | ICD-10-CM

## 2021-06-27 DIAGNOSIS — J9611 Chronic respiratory failure with hypoxia: Secondary | ICD-10-CM | POA: Diagnosis not present

## 2021-06-27 NOTE — Telephone Encounter (Signed)
Call patient in order to gather more information on UTI symptoms as well as request for nephrology referral.  Patient noted to have acute on chronic kidney injury while recently hospitalized.  Creatinine initially was elevated but as of 3 days ago creatinine was 1.15.  Discussed with patient that her renal function appears to be improving and did not think they nephrology referral would be warranted at this time.  However given her having to UTIs warranting hospitalization for sepsis and bacteremia, recommended urology referral.  Patient expresses concern that she requires a PICC line due to significant medication allergies. Patient requests urology referral as close as possible to [facility] or [place] to help alleviate transportation issues.   Urology referral placed.  Eulis Foster, MD [place], PGY-3 [phone]

## 2021-06-27 NOTE — Telephone Encounter (Signed)
[place] RN calls nurse line to give update. [name] reports the patient has completed Rocephin, however feels she is not completely emptying her bladder. [name] denies any other UTI associated symptoms from patient. [name] reports the patient was well appearing at home visit today. [name] reports she got a UA and culture on her and will fax results to our office.   In addition patient called nurse line requesting a Nephrology referral.

## 2021-07-01 NOTE — Telephone Encounter (Signed)
Patient returns call to nurse line regarding urine culture results. Urine culture results are in PCP box and were negative for growth.   Informed patient. Advised patient to follow up with Urology and that she should be receiving a phone call to set up new patient appointment.   Talbot Grumbling, RN

## 2021-07-02 DIAGNOSIS — Z7689 Persons encountering health services in other specified circumstances: Secondary | ICD-10-CM | POA: Diagnosis not present

## 2021-07-02 DIAGNOSIS — Z1239 Encounter for other screening for malignant neoplasm of breast: Secondary | ICD-10-CM | POA: Diagnosis not present

## 2021-07-02 DIAGNOSIS — I5030 Unspecified diastolic (congestive) heart failure: Secondary | ICD-10-CM | POA: Diagnosis not present

## 2021-07-02 DIAGNOSIS — J449 Chronic obstructive pulmonary disease, unspecified: Secondary | ICD-10-CM | POA: Diagnosis not present

## 2021-07-02 DIAGNOSIS — G40909 Epilepsy, unspecified, not intractable, without status epilepticus: Secondary | ICD-10-CM | POA: Diagnosis not present

## 2021-07-02 DIAGNOSIS — Z23 Encounter for immunization: Secondary | ICD-10-CM | POA: Diagnosis not present

## 2021-07-02 DIAGNOSIS — N189 Chronic kidney disease, unspecified: Secondary | ICD-10-CM | POA: Diagnosis not present

## 2021-07-02 DIAGNOSIS — Z79899 Other long term (current) drug therapy: Secondary | ICD-10-CM | POA: Diagnosis not present

## 2021-07-02 DIAGNOSIS — A419 Sepsis, unspecified organism: Secondary | ICD-10-CM | POA: Diagnosis not present

## 2021-07-02 DIAGNOSIS — N390 Urinary tract infection, site not specified: Secondary | ICD-10-CM | POA: Diagnosis not present

## 2021-07-02 DIAGNOSIS — R7303 Prediabetes: Secondary | ICD-10-CM | POA: Diagnosis not present

## 2021-07-02 DIAGNOSIS — Z09 Encounter for follow-up examination after completed treatment for conditions other than malignant neoplasm: Secondary | ICD-10-CM | POA: Diagnosis not present

## 2021-07-08 DIAGNOSIS — J449 Chronic obstructive pulmonary disease, unspecified: Secondary | ICD-10-CM | POA: Diagnosis not present

## 2021-07-08 DIAGNOSIS — J441 Chronic obstructive pulmonary disease with (acute) exacerbation: Secondary | ICD-10-CM | POA: Diagnosis not present

## 2021-07-08 DIAGNOSIS — R0902 Hypoxemia: Secondary | ICD-10-CM | POA: Diagnosis not present

## 2021-07-09 ENCOUNTER — Ambulatory Visit: Payer: Medicaid Other | Admitting: Cardiovascular Disease

## 2021-07-11 ENCOUNTER — Other Ambulatory Visit: Payer: Self-pay | Admitting: *Deleted

## 2021-07-11 ENCOUNTER — Other Ambulatory Visit: Payer: Self-pay

## 2021-07-11 NOTE — Patient Outreach (Signed)
Medicaid Managed Care   Nurse Care Manager Note  07/11/2021 Name:  [name] MRN:  [id number] DOB:  1956-03-12  [name] is an 65 y.o. year old female who is a primary patient of [name], [name], MD.  The Medicaid Managed Care Coordination team was consulted for assistance with:    CHF COPD  [name] was given information about [employer] team services today. [place] Patient agreed to services and verbal consent obtained.  Engaged with patient by telephone for follow up visit in response to provider referral for case management and/or care coordination services.   Assessments/Interventions:  Review of past medical history, allergies, medications, health status, including review of consultants reports, laboratory and other test data, was performed as part of comprehensive evaluation and provision of chronic care management services.  SDOH (Social Determinants of Health) assessments and interventions performed:   Care Plan  Allergies  Allergen Reactions   Amoxicillin Anaphylaxis    Breakout, mouth swells   Azithromycin Swelling and Rash    "swelling all over, high, thrush"   Cefdinir     Throat swelling after 3rd dose   Chantix [Varenicline] Other (See Comments)    "bad dreams, difficulty breathing"   Doxycycline Hyclate Anaphylaxis and Rash    swelling   Levofloxacin Anaphylaxis   Penicillins Anaphylaxis, Shortness Of Breath and Rash    "ALMOST DIED, ended up in hospital"  PCN reaction causing immediate rash, facial/tongue/throat swelling, SOB or lightheadedness with hypotension: YES PCN reaction causing severe rash involving mucus membranes or skin necrosis: NO PCN reaction that required hospitalization YES Has patient had a PCN reaction occurring within the last 10 years: NO    Sulfonamide Derivatives Shortness Of Breath, Swelling and Rash    "break out from head to toe"   Toradol [Ketorolac Tromethamine] Other (See Comments)     Seizure    Clarithromycin Hives    swelling   Nortriptyline Swelling, Rash and Other (See Comments)    Per patient had "kidney problems" on this medication, could not use bathroom (urinary retention)   Paxlovid [Nirmatrelvir-Ritonavir] Swelling    Throat swelling   Codeine Nausea And Vomiting   Fluticasone-Salmeterol Other (See Comments)    REACTION: ulcers in mouth   Triptans Rash    Mouth breaks out and start itching    Medications Reviewed Today     Reviewed by Veronica Montane, RN (Registered Nurse) on 07/11/21 at [place] List Status: <None>   Medication Order Taking? Sig Documenting Provider Last Dose Status Informant  acetaminophen (TYLENOL) 500 MG tablet [id number] No Take 1,000 mg by mouth daily as needed for headache.  Patient not taking: Reported on 05/24/2021   [provider] Not Taking Active Self  albuterol (PROAIR HFA) 108 (90 Base) MCG/ACT inhaler [id number] Yes INHALE TWO PUFFS INTO THE LUNGS EVERY 6 HOURS AS NEEDED FOR SHORTNESS OF BREATH [name], [name], MD Taking Active Self  albuterol (PROVENTIL) (2.5 MG/3ML) 0.083% nebulizer solution [id number] Yes USE 1 VIAL IN NEBULIZER EVERY 6 HOURS AS NEEDED FOR SHORTNESS OF BREATH [name], [name], MD Taking Active Self  aspirin EC 81 MG tablet [id number] Yes Take 81 mg by mouth daily. Swallow whole. [provider] Taking Active Self  Cholecalciferol (VITAMIN D3) 50 MCG (2000 UT) capsule [id number] Yes TAKE ONE CAPSULE BY MOUTH DAILY [name], [name], MD Taking Active Self  DULERA 200-5 MCG/ACT AERO [id number] Yes INHALE TWO PUFFS INTO THE LUNGS TWICE DAILY [name], [name], MD Taking Active  ferrous sulfate 324 (65 Fe) MG TBEC [id number] Yes TAKE ONE TABLET BY MOUTH EVERY OTHER DAY [name], [name], MD Taking Active   fluticasone (FLONASE) 50 MCG/ACT nasal spray [id number] Yes Place 1 spray into both nostrils daily. [provider] Taking Active Self   gabapentin (NEURONTIN) 100 MG capsule [id number] Yes Take 1 capsule (100 mg total) by mouth at bedtime. [name], [name], MD Taking Active Self           Med Note (Samvel Zinn A   Thu Jul 11, 2021  3:41 PM) Taking three times a day  ibuprofen (ADVIL) 800 MG tablet [id number] Yes Take 800 mg by mouth every 8 (eight) hours as needed. [provider] Taking Active   lamoTRIgine (LAMICTAL) 200 MG tablet [id number] Yes TAKE ONE AND ONE-HALF TABLETS BY MOUTH TWICE DAILY [name], NP Taking Active Self  loratadine (CLARITIN) 10 MG tablet [id number] Yes TAKE ONE TABLET BY MOUTH DAILY [name], [name], MD Taking Active Self  meloxicam (MOBIC) 15 MG tablet [id number] No Take 15 mg by mouth daily.  Patient not taking: Reported on 07/11/2021   [provider] Not Taking Active Self           Med Note (Shizuye Rupert A   Tue Apr 09, 2021 11:43 AM)    nystatin (MYCOSTATIN) 100000 UNIT/ML suspension [id number] No Take 5 mLs (500,000 Units total) by mouth 4 (four) times daily.  Patient not taking: Reported on 05/24/2021   Lestine Box, PA-C Not Taking Active   omeprazole (PRILOSEC) 40 MG capsule [id number] Yes TAKE ONE CAPSULE BY MOUTH TWICE DAILY [name], [name], MD Taking Active   OXYGEN [id number] Yes Inhale 3 L into the lungs continuous. [provider] Taking Active Self  pantoprazole (PROTONIX) 40 MG tablet [id number] Yes Take 1 tablet (40 mg total) by mouth 2 (two) times daily. [name], [place] Taking Active Self  polyethylene glycol powder [facility]) powder [id number] No MIX 17 GMS IN LIQUID AND DRINK ONCE DAILY  Patient not taking: Reported on 05/24/2021   [name], MD Not Taking Active   QUEtiapine (SEROQUEL) 100 MG tablet [id number] Yes TAKE ONE TABLET BY MOUTH AT BEDTIME [name], NP Taking Active Self  sertraline (ZOLOFT) 50 MG tablet [id number] Yes TAKE ONE TABLET BY MOUTH DAILY [name], [name],  MD Taking Active   simvastatin (ZOCOR) 40 MG tablet [id number] Yes TAKE ONE TABLET BY MOUTH AT BEDTIME [name], [name], MD Taking Active Self  topiramate (TOPAMAX) 100 MG tablet [id number] Yes TAKE ONE TABLET BY MOUTH EVERY MORNING AND TWO TABLETS AT BEDTIME [name], NP Taking Active Self            Patient Active Problem List   Diagnosis Date Noted   Candida infection, esophageal ([place])    Ventricular diastolic dysfunction determined by echocardiography 04/12/2021   Multiple allergies 04/12/2021   Pressure injury of ankle, stage 2 ([place]) 01/31/2021   Dehydration 01/18/2021   Aspiration pneumonia ([place]) 12/30/2020   Dysphagia 12/20/2020   Radiculopathy affecting upper extremity 11/12/2020   Encounter for wheelchair assessment 05/16/2020   Shortness of breath 07/26/2019   Chronic pain of left knee 12/06/2018   Tobacco dependence 07/02/2018   Elevated serum creatinine 06/01/2018   Generalized weakness 05/15/2017   Chronic bronchitis ([place]) 01/14/2017   Claudication ([place]) 04/09/2016   Deviated septum 02/06/2016    Class: Chronic   Nasal turbinate hypertrophy 02/06/2016   Seizure disorder ([place]) 11/12/2015   Intractable chronic migraine without aura with status  migrainosus 05/23/2015   Rhinitis, chronic 04/25/2015   Dizziness 10/24/2014   Pulmonary hypertension ([place]) 42/59/5638   Diastolic CHF ([place]) 75/64/3329   BPPV (benign paroxysmal positional vertigo) 03/09/2013   Chronic respiratory failure ([place]) 11/01/2012   Left shoulder pain 06/27/2012   Headache 05/09/2012   Anxiety state 02/13/2010   Obstructive sleep apnea 02/19/2009   Vitamin D deficiency 01/21/2008   OSTEOPENIA 11/24/2007   Irritable bowel syndrome 11/17/2007   COPD (chronic obstructive pulmonary disease) ([place]) 09/24/2007   Hyperlipidemia 10/08/2006   OBESITY, NOS  BMI 33.8 10/08/2006   GLAUCOMA 10/08/2006   GASTROESOPHAGEAL REFLUX, NO ESOPHAGITIS 10/08/2006   HERNIA, HIATAL, NONCONGENITAL  10/08/2006   INCONTINENCE, STRESS, FEMALE 10/08/2006   DJD (degenerative joint disease) of knee 10/08/2006    Conditions to be addressed/monitored per PCP order:  CHF and COPD  Care Plan : COPD (Adult)  Updates made by Veronica Montane, RN since 07/11/2021 12:00 AM  Completed 07/11/2021   Problem: Disease Progression (COPD) Resolved 07/11/2021  Note:   Resolving due to duplicate goal     Long-Range Goal: Disease Progression Minimized or Managed Completed 07/11/2021  Start Date: 08/17/2020  Expected End Date: 07/11/2021  Recent Progress: On track  Priority: Medium  Note:   Current Barriers:  Chronic Disease Management support and education needs related to COPD-Ms. Schriner reports difficulty swallowing solids and liquids, missed her appointment for evaluation due to lack of transportation. She is not sleeping consistently at night-lost her CPAP during a recent move. She does not want to go into the hospital for a sleep study due to fear of Covid. She has not been vaccinated due to her many allergies.  A referral for Pulmonology in [place] was placed by PCP-patient to call and schedule new appointment. Patient rescheduled her neurology appointment to June. A new GI referral has been placed-patient to call and schedule appointment. She would like for all appointments to be after June 3 due to school will be out and she will not have to be responsible for child care for her grandchildren. Patient was hospitalized in May for CHF and pnuemonia. She reports not smoking since her admission. She is feeling better, but has developed a bedsore. Ms. Stoffel has a swallow study scheduled 6/23. She reports a headache everyday after taking her morning medications. She continues to not smoke and is feeling better. She had a swallow study today. Ms. Shugars was +Covid on 8/15, now with bronchitis and thrush. Scheduled for EGD on 04/16/21. Waiting to be scheduled with PT for shoulder pain. Ms. Razavi would like a "hospital table"  to go over her bed for meal time. -Update-Ms. Bekker has several upcoming appointments that we reviewed today. Her husband will take her to these appointments. The thrush has improved and she is feeling better. She would like an update on PT referral placed by PCP. 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  Transportation barriers Food Insecurities-Has food stamps, but tends to run out of food before the end of the month Non-adherence to scheduled provider appointments-due to transportation barriers Resolving due to duplicate goal Nurse Case Manager Clinical Goal(s):  patient will verbalize understanding of plan for swallowing evaluation. -scheduled 6/23-MET patient will meet with RN Care Manager to address plan for sleep study to acquire new CPAP-Patient would like an in home sleep study and plans to inquire at her next neurology appointment-MET patient will attend all scheduled medical appointments patient will work with CM team pharmacist to review medications-Met patient will  work with Care Guide to address community resources/food insecurities-Met Interventions:  Inter-disciplinary care team collaboration (see longitudinal plan of care) Provided information for [employer] [phone] to inquire about a table Advised to call [facility] [phone] for smoking cessation assistance provided by [facility] Reviewed scheduled/upcoming provider appointments-11/9 with Pulmonology, 11/16 with Asthma and Allergy and 11/29 with Cardiology Encouraged patient to continue to cut back on smoking Discussed referral to PT, explained that she was accepted by [facility] on 10/31 and she should be contacted for scheduling SDOH assessment Provided education COPD eating plan Patient Goals/Self-Care Activities - attend all scheduled appointments:11/9 with Pulmonology, 11/16 with Asthma and Allergy and 11/29 with Cardiology - contact [facility] for needed items [phone]   - call and schedule a follow up with PCP  - call [facility] [phone] for smoking cessation assistance provided by [facility]  - call [phone] for transportation to medical appointments. - begin a notebook of services in my neighborhood or community - call 211 when I need some help - follow-up on any referrals for help I am given, call to schedule an appointment for ENT [phone]  Pulmonary [phone] and GI [phone] - make a list of family or friends that I can call  - develop a new routine to improve sleep - eat healthy - get at least 7 to 8 hours of sleep at night - keep room cool and dark - practice relaxation or meditation daily Follow Up Plan: Telephone follow up appointment with Managed Medicaid care management team member scheduled for:07/11/21 @ 3:45pm        Care Plan : [place] of Care  Updates made by Veronica Montane, RN since 07/11/2021 12:00 AM     Problem: Chronic health management needs related to CHF and COPD      Long-Range Goal: Develop of Plan of Care to address health management needs related to CHF and COPD   Start Date: 07/11/2021  Expected End Date: 10/09/2021  Priority: High  Note:   Current Barriers:  Chronic Disease Management support and education needs related to CHF and COPD-Ms. Mahl is managing her health with the help of her family. She has had 2 recent hospitalizations related to UTI's. She continues to work on smoking cessation. She started Physical therapy this week and will have 2 visits each week at home. She is switching PCP due to location and distance to her home.  RNCM Clinical Goal(s):  Patient will verbalize understanding of plan for management of CHF and COPD as evidenced by patient verbalization and self monitoring activities take all medications exactly as prescribed and will call provider for medication related questions as evidenced by documentation in EMR    attend all scheduled medical appointments: 07/17/21 with new  PCP at [facility], 12/14 @ 2:15p with Butler Denmark, 08/15/21 with Dr. Halford Chessman, 08/23/21 with Dr. Ernst Bowler and call to reschedule missed Cardiology appointment as evidenced by documentation in EMR        demonstrate improved adherence to prescribed treatment plan for CHF and COPD as evidenced by physician notes documented in EMR  through collaboration with RN Care manager, provider, and care team.   Interventions: Inter-disciplinary care team collaboration (see longitudinal plan of care) Evaluation of current treatment plan related to  self management and patient's adherence to plan as established by provider Provided information on preventing UTI Updated Epic with new PCP   Heart Failure Interventions:  (Status: New goal.)  Long Term Goal  Basic overview  and discussion of pathophysiology of Heart Failure reviewed Discussed importance of daily weight and advised patient to weigh and record daily Reviewed role of diuretics in prevention of fluid overload and management of heart failure Assessed social determinant of health barriers Advise patient to reschedule missed Cardiology appointment  COPD: (Status: New goal.) Long Term Goal  Reviewed medications with patient, including use of prescribed maintenance and rescue inhalers, and provided instruction on medication management and the importance of adherence Provided patient with basic written and verbal COPD education on self care/management/and exacerbation prevention Advised patient to track and manage COPD triggers Advised patient to self assesses COPD action plan zone and make appointment with provider if in the yellow zone for 48 hours without improvement Provided education about and advised patient to utilize infection prevention strategies to reduce risk of respiratory infection Discussed the importance of adequate rest and management of fatigue with COPD Assessed social determinant of health barriers   Patient Goals/Self-Care  Activities: Take medications as prescribed   Attend all scheduled provider appointments Call pharmacy for medication refills 3-7 days in advance of running out of medications Call provider office for new concerns or questions  call office if I gain more than 2 pounds in one day or 5 pounds in one week use salt in moderation watch for swelling in feet, ankles and legs every day eat more whole grains, fruits and vegetables, lean meats and healthy fats track symptoms and what helps feel better or worse dress right for the weather, hot or cold - eliminate symptom triggers at home - eat healthy/prescribed diet: heart healthy - get at least 7 to 8 hours of sleep at night - use devices that will help like a cane, sock-puller or reacher - do breathing exercises every day       Follow Up:  Patient agrees to Care Plan and Follow-up.  Plan: The Managed Medicaid care management team will reach out to the patient again over the next 30 days.  Date/time of next scheduled RN care management/care coordination outreach:  08/13/21 @ 3:45pm  Lurena Joiner RN, BSN [place]  [employer] manager

## 2021-07-11 NOTE — Patient Instructions (Signed)
Visit Information  Ms. Fait was given information about Medicaid Managed Care team care coordination services as a part of their [place] Medicaid benefit. [place] verbally consented to engagement with the [facility] Managed Care team.   If you are experiencing a medical emergency, please call 911 or report to your local emergency department or urgent care.   If you have a non-emergency medical problem during routine business hours, please contact your provider's office and ask to speak with a nurse.   For questions related to your [facility], please call: [phone] or visit the homepage here: [email or web address]  If you would like to schedule transportation through your [facility], please call the following number at least 2 days in advance of your appointment: [phone].   Call the [place] at [phone], at any time, 24 hours a day, 7 days a week. If you are in danger or need immediate medical attention call 911.  If you would like help to quit smoking, call 1-800-QUIT-NOW 720 283 6683) OR Espaol: 1-855-Djelo-Ya (0-737-106-2694) o para ms informacin haga clic aqu or Text READY to 200-400 to register via text  Ms. [place] - following are the goals we discussed in your visit today:   Goals Addressed             This Visit's Progress    COMPLETED: Find Help in My Community       Resolving due to duplicate goal  Timeframe:  Long-Range Goal Priority:  Medium Start Date:   08/17/20                          Expected End Date:  07/11/21                     Follow up 07/11/21  - contact [place] for needed items [phone] - call and schedule a follow up with PCP - call [employer] [phone] for smoking cessation assistance provided by [facility]  - call [phone] for transportation to medical appointments. - begin a  notebook of services in my neighborhood or community - call 211 when I need some help - follow-up on any referrals for help I am given, call to schedule an appointment for ENT [phone]  Pulmonary [phone] and GI [phone] - make a list of family or friends that I can call    Why is this important?   Knowing how and where to find help for yourself or family in your neighborhood and community is an important skill.  You will want to take some steps to learn how.          COMPLETED: Manage Fatigue (Tiredness-COPD)       Resolving due to duplicate goal  Follow up 07/11/21   - develop a new routine to improve sleep - eat healthy - get at least 7 to 8 hours of sleep at night - keep room cool and dark - practice relaxation or meditation daily    Why is this important?   Feeling tired or worn out is a common symptom of COPD (chronic obstructive pulmonary disease).  Learning when you feel your best and when you need rest is important.  Managing the tiredness (fatigue) will help you be active and enjoy life.             Please see education materials related to UTI, CHF and COPD provided by MyChart link.  Patient has access to MyChart and can view provided education  Telephone follow up appointment with Managed Medicaid care management team member scheduled for:  Lurena Joiner RN, BSN [place] RN Care Coordinator   Following is a copy of your plan of care:  Care Plan : COPD (Adult)  Updates made by [name], RN since 07/11/2021 12:00 AM  Completed 07/11/2021   Problem: Disease Progression (COPD) Resolved 07/11/2021  Note:   Resolving due to duplicate goal     Long-Range Goal: Disease Progression Minimized or Managed Completed 07/11/2021  Start Date: 08/17/2020  Expected End Date: 07/11/2021  Recent Progress: On track  Priority: Medium  Note:   Current Barriers:  Chronic Disease Management support and education needs related to  COPD-Ms. [place] reports difficulty swallowing solids and liquids, missed her appointment for evaluation due to lack of transportation. She is not sleeping consistently at night-lost her CPAP during a recent move. She does not want to go into the hospital for a sleep study due to fear of Covid. She has not been vaccinated due to her many allergies.  A referral for Pulmonology in [place] was placed by PCP-patient to call and schedule new appointment. Patient rescheduled her neurology appointment to June. A new GI referral has been placed-patient to call and schedule appointment. She would like for all appointments to be after June 3 due to school will be out and she will not have to be responsible for child care for her grandchildren. Patient was hospitalized in May for CHF and pnuemonia. She reports not smoking since her admission. She is feeling better, but has developed a bedsore. Ms. Byus has a swallow study scheduled 6/23. She reports a headache everyday after taking her morning medications. She continues to not smoke and is feeling better. She had a swallow study today. Ms. Sciara was +Covid on 8/15, now with bronchitis and thrush. Scheduled for EGD on 04/16/21. Waiting to be scheduled with PT for shoulder pain. Ms. Esguerra would like a "hospital table" to go over her bed for meal time. -Update-Ms. Trant has several upcoming appointments that we reviewed today. Her husband will take her to these appointments. The thrush has improved and she is feeling better. She would like an update on PT referral placed by PCP. 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  Transportation barriers Food Insecurities-Has food stamps, but tends to run out of food before the end of the month Non-adherence to scheduled provider appointments-due to transportation barriers Resolving due to duplicate goal Nurse Case Manager Clinical Goal(s):  patient will verbalize understanding of plan for swallowing evaluation. -scheduled  6/23-MET patient will meet with RN Care Manager to address plan for sleep study to acquire new CPAP-Patient would like an in home sleep study and plans to inquire at her next neurology appointment-MET patient will attend all scheduled medical appointments patient will work with CM team pharmacist to review medications-Met patient will work with Care Guide to address community resources/food insecurities-Met Interventions:  Inter-disciplinary care team collaboration (see longitudinal plan of care) Provided information for [employer] [phone] to inquire about a table Advised to call [facility] [phone] for smoking cessation assistance provided by [facility] Reviewed scheduled/upcoming provider appointments-11/9 with Pulmonology, 11/16 with Asthma and Allergy and 11/29 with Cardiology Encouraged patient to continue to cut back on smoking Discussed referral to PT, explained that she was accepted by [facility] on 10/31 and she should be contacted for scheduling SDOH assessment Provided  education COPD eating plan Patient Goals/Self-Care Activities - attend all scheduled appointments:11/9 with Pulmonology, 11/16 with Asthma and Allergy and 11/29 with Cardiology - contact [facility] for needed items [phone]  - call and schedule a follow up with PCP  - call [facility] [phone] for smoking cessation assistance provided by [facility]  - call [phone] for transportation to medical appointments. - begin a notebook of services in my neighborhood or community - call 211 when I need some help - follow-up on any referrals for help I am given, call to schedule an appointment for ENT [phone]  Pulmonary [phone] and GI [phone] - make a list of family or friends that I can call  - develop a new routine to improve sleep - eat healthy - get at least 7 to 8 hours of sleep at night - keep room cool and dark - practice relaxation or meditation daily Follow Up  Plan: Telephone follow up appointment with Managed Medicaid care management team member scheduled for:07/11/21 @ 3:45pm        Care Plan : Flasher of Care  Updates made by [name], RN since 07/11/2021 12:00 AM     Problem: Chronic health management needs related to CHF and COPD      Long-Range Goal: Develop of Plan of Care to address health management needs related to CHF and COPD   Start Date: 07/11/2021  Expected End Date: 10/09/2021  Priority: High  Note:   Current Barriers:  Chronic Disease Management support and education needs related to CHF and COPD-Ms. Odonohue is managing her health with the help of her family. She has had 2 recent hospitalizations related to UTI's. She continues to work on smoking cessation. She started Physical therapy this week and will have 2 visits each week at home. She is switching PCP due to location and distance to her home.  RNCM Clinical Goal(s):  Patient will verbalize understanding of plan for management of CHF and COPD as evidenced by patient verbalization and self monitoring activities take all medications exactly as prescribed and will call provider for medication related questions as evidenced by documentation in EMR    attend all scheduled medical appointments: 07/17/21 with new PCP at [facility], 12/14 @ 2:15p with Butler Denmark, 08/15/21 with Dr. Halford Chessman, 08/23/21 with Dr. Ernst Bowler and call to reschedule missed Cardiology appointment as evidenced by documentation in EMR        demonstrate improved adherence to prescribed treatment plan for CHF and COPD as evidenced by physician notes documented in EMR  through collaboration with RN Care manager, provider, and care team.   Interventions: Inter-disciplinary care team collaboration (see longitudinal plan of care) Evaluation of current treatment plan related to  self management and patient's adherence to plan as established by provider Provided information on preventing UTI Updated  Epic with new PCP   Heart Failure Interventions:  (Status: New goal.)  Long Term Goal  Basic overview and discussion of pathophysiology of Heart Failure reviewed Discussed importance of daily weight and advised patient to weigh and record daily Reviewed role of diuretics in prevention of fluid overload and management of heart failure Assessed social determinant of health barriers Advise patient to reschedule missed Cardiology appointment  COPD: (Status: New goal.) Long Term Goal  Reviewed medications with patient, including use of prescribed maintenance and rescue inhalers, and provided instruction on medication management and the importance of adherence Provided patient with basic written and verbal COPD education on self care/management/and exacerbation  prevention Advised patient to track and manage COPD triggers Advised patient to self assesses COPD action plan zone and make appointment with provider if in the yellow zone for 48 hours without improvement Provided education about and advised patient to utilize infection prevention strategies to reduce risk of respiratory infection Discussed the importance of adequate rest and management of fatigue with COPD Assessed social determinant of health barriers   Patient Goals/Self-Care Activities: Take medications as prescribed   Attend all scheduled provider appointments Call pharmacy for medication refills 3-7 days in advance of running out of medications Call provider office for new concerns or questions  call office if I gain more than 2 pounds in one day or 5 pounds in one week use salt in moderation watch for swelling in feet, ankles and legs every day eat more whole grains, fruits and vegetables, lean meats and healthy fats track symptoms and what helps feel better or worse dress right for the weather, hot or cold - eliminate symptom triggers at home - eat healthy/prescribed diet: heart healthy - get at least 7 to 8 hours of sleep  at night - use devices that will help like a cane, sock-puller or reacher - do breathing exercises every day

## 2021-07-16 ENCOUNTER — Ambulatory Visit: Payer: Medicaid Other | Admitting: Family Medicine

## 2021-07-17 DIAGNOSIS — I5030 Unspecified diastolic (congestive) heart failure: Secondary | ICD-10-CM | POA: Diagnosis not present

## 2021-07-17 DIAGNOSIS — Z8619 Personal history of other infectious and parasitic diseases: Secondary | ICD-10-CM | POA: Diagnosis not present

## 2021-07-17 DIAGNOSIS — Z5320 Procedure and treatment not carried out because of patient's decision for unspecified reasons: Secondary | ICD-10-CM | POA: Diagnosis not present

## 2021-07-17 DIAGNOSIS — L89312 Pressure ulcer of right buttock, stage 2: Secondary | ICD-10-CM | POA: Diagnosis not present

## 2021-07-17 DIAGNOSIS — N189 Chronic kidney disease, unspecified: Secondary | ICD-10-CM | POA: Diagnosis not present

## 2021-07-17 DIAGNOSIS — M1990 Unspecified osteoarthritis, unspecified site: Secondary | ICD-10-CM | POA: Diagnosis not present

## 2021-07-17 DIAGNOSIS — E876 Hypokalemia: Secondary | ICD-10-CM | POA: Diagnosis not present

## 2021-07-17 DIAGNOSIS — J441 Chronic obstructive pulmonary disease with (acute) exacerbation: Secondary | ICD-10-CM | POA: Diagnosis not present

## 2021-07-21 ENCOUNTER — Other Ambulatory Visit: Payer: Self-pay | Admitting: Family Medicine

## 2021-07-24 ENCOUNTER — Ambulatory Visit: Payer: Medicaid Other | Admitting: Neurology

## 2021-07-24 ENCOUNTER — Encounter: Payer: Self-pay | Admitting: Neurology

## 2021-07-24 VITALS — BP 103/63 | HR 79 | Ht 61.0 in | Wt 179.0 lb

## 2021-07-24 DIAGNOSIS — G40909 Epilepsy, unspecified, not intractable, without status epilepticus: Secondary | ICD-10-CM

## 2021-07-24 DIAGNOSIS — Z79899 Other long term (current) drug therapy: Secondary | ICD-10-CM | POA: Diagnosis not present

## 2021-07-24 DIAGNOSIS — G43711 Chronic migraine without aura, intractable, with status migrainosus: Secondary | ICD-10-CM

## 2021-07-24 DIAGNOSIS — R799 Abnormal finding of blood chemistry, unspecified: Secondary | ICD-10-CM | POA: Diagnosis not present

## 2021-07-24 DIAGNOSIS — E559 Vitamin D deficiency, unspecified: Secondary | ICD-10-CM | POA: Diagnosis not present

## 2021-07-24 MED ORDER — TOPIRAMATE 100 MG PO TABS: ORAL_TABLET | ORAL | 3 refills | Status: DC

## 2021-07-24 MED ORDER — LAMOTRIGINE 200 MG PO TABS: 300.0000 mg | ORAL_TABLET | Freq: Two times a day (BID) | ORAL | 1 refills | Status: DC

## 2021-07-24 MED ORDER — QUETIAPINE FUMARATE 100 MG PO TABS: 100.0000 mg | ORAL_TABLET | Freq: Every day | ORAL | 0 refills | Status: DC

## 2021-07-24 NOTE — Progress Notes (Signed)
PATIENT: [name] DOB: 03/29/1956  REASON FOR VISIT: follow up HISTORY FROM: patient Primary Neurologist: Dr. April Manson since Dr [name] retired   HISTORY OF PRESENT ILLNESS: Today 07/24/21 [name] here today for follow-up, hasn't been seen since Aug 2021, at that time referred to EMU with Dr. Hortense Ramal for intractable epilepsy. Remains on Lamictal 300 mg BID, Topamax 100/200 mg daily  Has seizures where she will blank out (2 this month), stare off, witnessed by her grandson, lasted no more than 1 minute. Was normal beforehand. Afterwards was tired, groggy, wanted to sleep. In the morning, 2 hours after taking morning pills. Will have spell on average, every 2 months. Can't identify any triggers.  She lives in [place], didn't have transportation, she cancelled the EMU admission.   Headaches are improved. Not daily anymore. Doesn't eat more than 1 meal a day. Takes Tylenol, gets 1 migraine a month that is is significant.   Recently hospitalized for UTI was septic. Still on Seroquel 100 mg at bedtime to help sleep. Is wearing 2 L oxygen daily, forgot it today. Still smoking, 5 cigarette daily. Here today with her grandson, [name].  Update 04/10/2020 SS:[name] is a 65 year old female with history of intractable epilepsy and intractable headaches. She is on Lamictal and Topamax, when last seen Depakote was added. She complained of swallowing problems, barium swallow recommended caution with bread and meats, thin liquids, had mild pharyngeal dysphagia.  Could not tolerate Depakote, reported stomach upset, took for 1 week.  Reports 1 seizure of loss of consciousness while lying on the bed, triggered by coughing episode, she says not always coughing related, usually once a month.  Does have episodes of staring, lasting a few seconds, several times a month.  Headaches are fairly well controlled, on average 3-4 migraines a month, tolerating Aimovig 70 mg every 2 weeks well, has been quite  beneficial.  Has noted that her voice is raspy, continues with trouble swallowing, does have COPD, still smokes.  Recently saw PCP, did not mention this.  She does not drive, lives with family.  HISTORY 10/12/2019 Dr. [name]: [name] is a 65 year old right-handed white female with a history of intractable epilepsy and intractable headache.  The patient has been on lamotrigine and Topamax without much benefit with her seizures.  She continues to have frequent headaches, her headaches have been daily in nature.  She has not responded to Ajovy.  In the past, she has been on Botox injections and seemed to improve some with the headaches.  More recently in the last month she is having increasing problems with swallowing with both liquids and solids.  She indicates that food will get caught in the back of the throat and she has to cough it back up again.  She is not getting food down in the lungs.  She has noted some hoarseness of the voice.  She is not losing weight, in fact she is gaining weight.  The patient does not operate a motor vehicle.  She was set up for video visit today but cannot access this, a telephone visit therefore was done.  REVIEW OF SYSTEMS: Out of a complete 14 system review of symptoms, the patient complains only of the following symptoms, and all other reviewed systems are negative.  Seizure, headache  ALLERGIES: Allergies  Allergen Reactions   Amoxicillin Anaphylaxis    Breakout, mouth swells   Azithromycin Swelling and Rash    "swelling all over, high, thrush"   Cefdinir  Throat swelling after 3rd dose   Chantix [Varenicline] Other (See Comments)    "bad dreams, difficulty breathing"   Doxycycline Hyclate Anaphylaxis and Rash    swelling   Levofloxacin Anaphylaxis   Penicillins Anaphylaxis, Shortness Of Breath and Rash    "ALMOST DIED, ended up in hospital"  PCN reaction causing immediate rash, facial/tongue/throat swelling, SOB or lightheadedness with hypotension:  YES PCN reaction causing severe rash involving mucus membranes or skin necrosis: NO PCN reaction that required hospitalization YES Has patient had a PCN reaction occurring within the last 10 years: NO    Sulfonamide Derivatives Shortness Of Breath, Swelling and Rash    "break out from head to toe"   Toradol [Ketorolac Tromethamine] Other (See Comments)    Seizure    Clarithromycin Hives    swelling   Nortriptyline Swelling, Rash and Other (See Comments)    Per patient had "kidney problems" on this medication, could not use bathroom (urinary retention)   Paxlovid [Nirmatrelvir-Ritonavir] Swelling    Throat swelling   Codeine Nausea And Vomiting   Fluticasone-Salmeterol Other (See Comments)    REACTION: ulcers in mouth   Triptans Rash    Mouth breaks out and start itching    HOME MEDICATIONS: Outpatient Medications Prior to Visit  Medication Sig Dispense Refill   acetaminophen (TYLENOL) 500 MG tablet Take 1,000 mg by mouth daily as needed for headache.     albuterol (PROAIR HFA) 108 (90 Base) MCG/ACT inhaler INHALE TWO PUFFS INTO THE LUNGS EVERY 6 HOURS AS NEEDED FOR SHORTNESS OF BREATH 18 g 6   albuterol (PROVENTIL) (2.5 MG/3ML) 0.083% nebulizer solution USE 1 VIAL IN NEBULIZER EVERY 6 HOURS AS NEEDED FOR SHORTNESS OF BREATH 75 mL 8   ALLERGY RELIEF 10 MG tablet TAKE ONE TABLET BY MOUTH DAILY 90 tablet 1   aspirin EC 81 MG tablet Take 81 mg by mouth daily. Swallow whole.     Cholecalciferol (VITAMIN D3) 50 MCG (2000 UT) capsule TAKE ONE CAPSULE BY MOUTH DAILY 90 capsule 1   DULERA 200-5 MCG/ACT AERO INHALE TWO PUFFS INTO THE LUNGS TWICE DAILY 13 g 6   ferrous sulfate 324 (65 Fe) MG TBEC TAKE ONE TABLET BY MOUTH EVERY OTHER DAY 90 tablet 5   fluticasone (FLONASE) 50 MCG/ACT nasal spray Place 1 spray into both nostrils daily.     gabapentin (NEURONTIN) 100 MG capsule Take 1 capsule (100 mg total) by mouth at bedtime. 90 capsule 3   ibuprofen (ADVIL) 800 MG tablet Take 800 mg by mouth  every 8 (eight) hours as needed.     meloxicam (MOBIC) 15 MG tablet Take 15 mg by mouth daily.     nystatin (MYCOSTATIN) 100000 UNIT/ML suspension Take 5 mLs (500,000 Units total) by mouth 4 (four) times daily. 473 mL 0   omeprazole (PRILOSEC) 40 MG capsule TAKE ONE CAPSULE BY MOUTH TWICE DAILY 60 capsule 3   OXYGEN Inhale 3 L into the lungs continuous.     pantoprazole (PROTONIX) 40 MG tablet Take 1 tablet (40 mg total) by mouth 2 (two) times daily. 60 tablet 11   polyethylene glycol powder (GLYCOLAX/MIRALAX) powder MIX 17 GMS IN LIQUID AND DRINK ONCE DAILY 527 g 0   sertraline (ZOLOFT) 50 MG tablet TAKE ONE TABLET BY MOUTH DAILY 90 tablet 2   simvastatin (ZOCOR) 40 MG tablet TAKE ONE TABLET BY MOUTH AT BEDTIME 90 tablet 2   lamoTRIgine (LAMICTAL) 200 MG tablet TAKE ONE AND ONE-HALF TABLETS BY MOUTH TWICE DAILY 270 tablet  1   QUEtiapine (SEROQUEL) 100 MG tablet TAKE ONE TABLET BY MOUTH AT BEDTIME 90 tablet 1   topiramate (TOPAMAX) 100 MG tablet TAKE ONE TABLET BY MOUTH EVERY MORNING AND TWO TABLETS AT BEDTIME 270 tablet 3   No facility-administered medications prior to visit.    PAST MEDICAL HISTORY: Past Medical History:  Diagnosis Date   Allergic rhinitis    Arthritis    "hands and legs and feet" (04/27/2012)   Cancer ([place]) 2005   rt arm mole rem cancer   Chronic bronchitis ([place])    "keep it all the time" (04/27/2012)   COPD (chronic obstructive pulmonary disease) ([place])    COVID-19 virus infection    03-2021   Depression    PT DENIES   Diverticulosis    DJD (degenerative joint disease)    Emphysema    Epilepsy (HCC)    Esophageal stricture    Exertional dyspnea    GERD (gastroesophageal reflux disease)    Glaucoma    H/O blood clots    Right leg   History of COVID-19 03/2021   Hyperlipidemia    Hypertension    IBS (irritable bowel syndrome)    Migraine    Neck pain    Cervical disc   OSA on CPAP    6-7 yrs; pt wears CPAP but has not been able to use it lately due to  sinus issues   Oxygen deficiency    USES 3 LITERS 02 CONTINOUS   Pneumonia 2012; 04/27/2012   Right leg DVT ([place]) 1982   groin   Seizures ([place])    LAST SEIZURE 6 MONTHS OR  LONGER   Sleep apnea    Type II diabetes mellitus (HCC)    No meds since weight loss   UTI (urinary tract infection)    2 weeks ago   Vertigo 10/24/2014    PAST SURGICAL HISTORY: Past Surgical History:  Procedure Laterality Date   ABDOMINAL HYSTERECTOMY  2001   "partial"   BIOPSY  04/16/2021   Procedure: BIOPSY;  Surgeon: Mauri Pole, MD;  Location: [facility];  Service: Endoscopy;;   CARPAL TUNNEL RELEASE  ~ 2008   right   CATARACT EXTRACTION Bilateral    COLONOSCOPY     COLONOSCOPY WITH ESOPHAGOGASTRODUODENOSCOPY (EGD)     ESOPHAGOGASTRODUODENOSCOPY (EGD) WITH PROPOFOL N/A 04/16/2021   Procedure: ESOPHAGOGASTRODUODENOSCOPY (EGD) WITH PROPOFOL;  Surgeon: Mauri Pole, MD;  Location: [facility];  Service: Endoscopy;  Laterality: N/A;   KNEE ARTHROSCOPY  1990's   left   NASAL SEPTOPLASTY W/ TURBINOPLASTY Bilateral 05/23/2016   Procedure: NASAL SEPTOPLASTY WITH TURBINATE REDUCTION;  Surgeon: Jerrell Belfast, MD;  Location: [facility];  Service: ENT;  Laterality: Bilateral;   SHOULDER OPEN ROTATOR CUFF REPAIR  ~ 2010   left   SINUS ENDO WITH FUSION Right 05/23/2016   Procedure: LIMITED RIGHT ENDOSCOPIC SINUS SURGERY;  Surgeon: Jerrell Belfast, MD;  Location: [facility];  Service: ENT;  Laterality: Right;   TOTAL KNEE ARTHROPLASTY  08/23/2012   right   TOTAL KNEE ARTHROPLASTY  08/23/2012   Procedure: TOTAL KNEE ARTHROPLASTY;  Surgeon: Lorn Junes, MD;  Location: [facility];  Service: Orthopedics;  Laterality: Right;  RIGHT KNEE ARTHROPLASTY MEDIAL AND LATERAL COMPARTMENTS WITH PATELLA RESURFACING   TUBAL LIGATION  2001   UPPER GASTROINTESTINAL ENDOSCOPY      FAMILY HISTORY: Family History  Problem Relation Age of Onset   Diabetes Mother    Lung cancer Mother  was a smoker   Stroke Mother     Alcohol abuse Father    Lung cancer Father        was a smoker   Emphysema Father        was a smoker   Heart disease Sister    Other Sister        blood clotting disorder   Diabetes Brother    Seizures Brother    Diabetes Maternal Aunt    Breast cancer Maternal Aunt    Esophageal cancer Maternal Uncle    Diabetes Maternal Uncle    Heart disease Maternal Grandfather    Diabetes Maternal Grandfather    Bipolar disorder Son    Muscular dystrophy Son    Heart disease Son    Heart disease Other        uncle   Colon cancer Neg Hx    Pancreatic cancer Neg Hx    Stomach cancer Neg Hx    Colon polyps Neg Hx    Rectal cancer Neg Hx     SOCIAL HISTORY: Social History   Socioeconomic History   Marital status: Married    Spouse name: [name]   Number of children: 2   Years of education: 12   Highest education level: Not on file  Occupational History   Occupation: disabled  Tobacco Use   Smoking status: Every Day    Packs/day: 1.00    Years: 40.00    Pack years: 40.00    Types: Cigarettes    Start date: 08/12/1975    Last attempt to quit: 12/30/2020    Years since quitting: 0.5   Smokeless tobacco: Former    Quit date: 08/23/2012   Tobacco comments:    1-2 CIGS A DAY NOW   Vaping Use   Vaping Use: Never used  Substance and Sexual Activity   Alcohol use: No    Alcohol/week: 0.0 standard drinks   Drug use: No   Sexual activity: Never  Other Topics Concern   Not on file  Social History Narrative   Patient lives at home with husband [name], her son and his wife.    Patient has 2 children.    Patient has a high school education.    Patient is currently unemployed.    Patient is right handed.    Patient drinks 2 cups of caffeine per day.   Social Determinants of Radio broadcast assistant Strain: Not on file  Food Insecurity: No Food Insecurity   Worried About Charity fundraiser in the Last Year: Never true   Arboriculturist in the Last Year: Never true   Transportation Needs: Unmet Transportation Needs   Lack of Transportation (Medical): Yes   Lack of Transportation (Non-Medical): Yes  Physical Activity: Not on file  Stress: Not on file  Social Connections: Socially Integrated   Frequency of Communication with Friends and Family: More than three times a week   Frequency of Social Gatherings with Friends and Family: Once a week   Attends Religious Services: More than 4 times per year   Active Member of [employer] or Organizations: Yes   Attends Archivist Meetings: Never   Marital Status: Married  Human resources officer Violence: Not on file   PHYSICAL EXAM  Vitals:   07/24/21 1421  BP: 103/63  Pulse: 79  Weight: 179 lb (81.2 kg)  Height: 5\' 1"  (1.549 m)    Body mass index is 33.82 kg/m.  Generalized: Well developed, in  no acute distress  Neurological examination  Mentation: Alert oriented to time, place, history taking. Follows all commands speech and language fluent Cranial nerve II-XII: Pupils were equal round reactive to light. Extraocular movements were full, visual field were full on confrontational test. Facial sensation and strength were normal.  Head turning and shoulder shrug  were normal and symmetric. Motor: Good strength of all extremities Sensory: Sensory testing is intact to soft touch on all 4 extremities. No evidence of extinction is noted.  Coordination: Cerebellar testing reveals good finger-nose-finger and heel-to-shin bilaterally.  Gait and station: In wheelchair, can rise from seated position with push off, can ambulate in room with stand by assist Reflexes: Deep tendon reflexes are symmetric but decreased throughout  DIAGNOSTIC DATA (LABS, IMAGING, TESTING) - I reviewed patient records, labs, notes, testing and imaging myself where available.  Lab Results  Component Value Date   WBC 8.6 05/24/2021   HGB 10.1 (L) 05/24/2021   HCT 30.9 (L) 05/24/2021   MCV 93 05/24/2021   PLT 297 05/24/2021       Component Value Date/Time   NA 142 05/24/2021 0950   K 4.0 05/24/2021 0950   CL 103 05/24/2021 0950   CO2 24 05/24/2021 0950   GLUCOSE 90 05/24/2021 0950   GLUCOSE 82 06/13/2016 1441   BUN 13 05/24/2021 0950   CREATININE 1.25 (H) 05/24/2021 0950   CREATININE 0.91 06/13/2016 1441   CALCIUM 9.1 05/24/2021 0950   PROT 7.1 04/10/2020 1418   ALBUMIN 3.9 04/10/2020 1418   AST 12 04/10/2020 1418   ALT 6 04/10/2020 1418   ALKPHOS 156 (H) 04/10/2020 1418   BILITOT <0.2 04/10/2020 1418   GFRNONAA 59 (L) 04/10/2020 1418   GFRNONAA 70 06/13/2016 1441   GFRAA 68 04/10/2020 1418   GFRAA 80 06/13/2016 1441   Lab Results  Component Value Date   CHOL 148 06/19/2014   HDL 52 06/19/2014   LDLCALC 61 06/19/2014   LDLDIRECT 77 09/14/2012   TRIG 176 (H) 06/19/2014   CHOLHDL 2.8 06/19/2014   Lab Results  Component Value Date   HGBA1C 5.5 05/16/2020   Lab Results  Component Value Date   VITAMINB12 414 01/21/2008   Lab Results  Component Value Date   TSH 2.314 06/19/2014   ASSESSMENT AND PLAN 65 y.o. year old female  has a past medical history of Allergic rhinitis, Arthritis, Cancer ([place]) (2005), Chronic bronchitis ([place]), COPD (chronic obstructive pulmonary disease) ([place]), COVID-19 virus infection, Depression, Diverticulosis, DJD (degenerative joint disease), Emphysema, Epilepsy ([place]), Esophageal stricture, Exertional dyspnea, GERD (gastroesophageal reflux disease), Glaucoma, H/O blood clots, History of COVID-19 (03/2021), Hyperlipidemia, Hypertension, IBS (irritable bowel syndrome), Migraine, Neck pain, OSA on CPAP, Oxygen deficiency, Pneumonia (2012; 04/27/2012), Right leg DVT ([place]) (1982), Seizures ([place]), Sleep apnea, Type II diabetes mellitus ([place]), UTI (urinary tract infection), and Vertigo (10/24/2014). here with:  1.  Intractable epilepsy -Continues with spells of staring off, blanking out 2 this month so far -Will increase Topamax 20 mg twice a day, continue Lamictal 300 mg twice a  day -Will check Lamictal, Topamax, vitamin D level -Will order ambulatory EEG 24 hour at home, Dr. April Manson will read it -Has previously been referred to EMU to see Dr. Hortense Ramal, but cancelled due to transportation issues, she doesn't drive -EEG in 5956 was normal -Had a video monitor EEG in October 2016, did not have any seizure events during monitoring -Will follow-up with Dr. April Manson in 3-4 months for revisit   2.  Intractable headaches -Stopped Aimovig  in the hospital, unclear why  -Headaches are improved, but continue, may benefit from higher dose Topamax for seizure management  3.  Insomnia -Has remained on Seroquel 100 mg at bedtime, I refilled for 3 months, can determine if needed for chronic use going forward  Orders Placed This Encounter  Procedures   Topiramate Level   Lamotrigine level   Vitamin D, 25-hydroxy   AMBULATORY EEG   EEG Monitoring Adult EMU Spect (24 hour with video)   Butler Denmark, AGNP-C, DNP 07/24/2021, 3:12 PM [facility] [address] [place], [place] [address] [phone]

## 2021-07-24 NOTE — Patient Instructions (Signed)
Increase the Topamax to 200 mg twice daily Continue the Lamictal  Check 24 hour home EEG  Check labs today  See you back in 3-4 months with Dr. April Manson

## 2021-07-25 LAB — LAMOTRIGINE LEVEL: Lamotrigine Lvl: 2 ug/mL (ref 2.0–20.0)

## 2021-07-25 LAB — TOPIRAMATE LEVEL: Topiramate Lvl: 7.1 ug/mL (ref 2.0–25.0)

## 2021-07-25 LAB — VITAMIN D 25 HYDROXY (VIT D DEFICIENCY, FRACTURES): Vit D, 25-Hydroxy: 35.5 ng/mL (ref 30.0–100.0)

## 2021-07-27 DIAGNOSIS — J9611 Chronic respiratory failure with hypoxia: Secondary | ICD-10-CM | POA: Diagnosis not present

## 2021-07-29 ENCOUNTER — Telehealth: Payer: Self-pay | Admitting: *Deleted

## 2021-07-29 NOTE — Telephone Encounter (Signed)
I spoke to the patient. She verbalized understanding of the lab findings. She will increase topiramate as directed. She confirmed her lamotrigine instructions and denies any missed doses of medication.   She has not been scheduled for her 24-hr ambulatory EEG yet.

## 2021-07-29 NOTE — Telephone Encounter (Signed)
-----   Message from [name], NP sent at 07/29/2021  3:36 PM EST ----- Please let her know blood work is unremarkable. Will continue with the dose increase of Topamax for seizures. Also make sure she is scheduled for ambulatory home EEG 24 hour for Dr. April Manson to read. Also just verify she is taking her dose of Lamictal, blood level was on low end of normal.

## 2021-07-30 NOTE — Telephone Encounter (Signed)
EEG was sent to [facility] last week. Patient should allow them 2 weeks to reach out, especially d/t holiday season.

## 2021-08-07 DIAGNOSIS — J441 Chronic obstructive pulmonary disease with (acute) exacerbation: Secondary | ICD-10-CM | POA: Diagnosis not present

## 2021-08-07 DIAGNOSIS — R0902 Hypoxemia: Secondary | ICD-10-CM | POA: Diagnosis not present

## 2021-08-07 DIAGNOSIS — J449 Chronic obstructive pulmonary disease, unspecified: Secondary | ICD-10-CM | POA: Diagnosis not present

## 2021-08-13 ENCOUNTER — Other Ambulatory Visit: Payer: Self-pay

## 2021-08-13 ENCOUNTER — Other Ambulatory Visit: Payer: Self-pay | Admitting: *Deleted

## 2021-08-13 DIAGNOSIS — Z599 Problem related to housing and economic circumstances, unspecified: Secondary | ICD-10-CM

## 2021-08-13 NOTE — Patient Instructions (Addendum)
Visit Information  [name] was given information about Medicaid Managed Care team care coordination services as a part of their [place] Medicaid benefit. [name] verbally consented to engagement with the [facility] Managed Care team.   If you are experiencing a medical emergency, please call 911 or report to your local emergency department or urgent care.   If you have a non-emergency medical problem during routine business hours, please contact your provider's office and ask to speak with a nurse.   For questions related to your [facility], please call: [phone] or visit the homepage here: [email or web address]  If you would like to schedule transportation through your [facility], please call the following number at least 2 days in advance of your appointment: [phone].   Call the [facility] at [phone], at any time, 24 hours a day, 7 days a week. If you are in danger or need immediate medical attention call 911.  If you would like help to quit smoking, call 1-800-QUIT-NOW (224)476-6429) OR Espaol: 1-855-Djelo-Ya (8-676-195-0932) o para ms informacin haga clic aqu or Text READY to 200-400 to register via text  [name],   Please see education materials related to constipation, CHF and COPD provided by MyChart link.  Patient has access to MyChart and can view provided education  Telephone follow up appointment with Managed Medicaid care management team member scheduled for:09/12/21 @ 3:45pm  Lurena Joiner RN, BSN [facility] RN Care Coordinator   Following is a copy of your plan of care:  Care Plan : RN Care Manager Plan of Care  Updates made by [name], RN since 08/13/2021 12:00 AM     Problem: Chronic health management needs related to CHF and COPD      Long-Range Goal: Develop of  Plan of Care to address health management needs related to CHF and COPD   Start Date: 07/11/2021  Expected End Date: 10/09/2021  Priority: High  Note:   Current Barriers:  Chronic Disease Management support and education needs related to CHF and COPD-[name] has several upcoming appointments. She has transportation, but is concerned about affording the copay. She is experiencing constipation and only getting results after taking Ex-lax. She reports a 15# weight gain since Christmas.  RNCM Clinical Goal(s):  Patient will verbalize understanding of plan for management of CHF and COPD as evidenced by patient verbalization and self monitoring activities take all medications exactly as prescribed and will call provider for medication related questions as evidenced by documentation in EMR    attend all scheduled medical appointments: 08/14/21 with Urology, 08/15/21 with Dr. Halford Chessman, 08/20/21 with PCP, 08/21/21 with Cardiology(Dr. Gwenlyn Found) and 08/23/21 with Dr. Ernst Bowler and call to reschedule missed Cardiology appointment as evidenced by documentation in EMR        demonstrate improved adherence to prescribed treatment plan for CHF and COPD as evidenced by physician notes documented in EMR  through collaboration with RN Care manager, provider, and care team.   Interventions: Inter-disciplinary care team collaboration (see longitudinal plan of care) Evaluation of current treatment plan related to  self management and patient's adherence to plan as established by provider Provided information on constipation, discussed diet, advised patient discuss with PCP   Heart Failure Interventions:  (Status: Goal on Track (progressing): YES.)  Long Term Goal  Basic overview and discussion of pathophysiology of Heart Failure reviewed Discussed importance of daily weight and advised  patient to weigh and record daily Reviewed role of diuretics in prevention of fluid overload and management of heart failure Assessed social  determinant of health barriers Discussed upcoming Cardiology appointment on 08/21/21 Advised patient to discuss any new concerns or symptoms Call PCP today and report 15# weight gain  COPD: (Status: Goal on Track (progressing): YES.) Long Term Goal  Reviewed medications with patient, including use of prescribed maintenance and rescue inhalers, and provided instruction on medication management and the importance of adherence Provided instruction about proper use of medications used for management of COPD including inhalers Advised patient to self assesses COPD action plan zone and make appointment with provider if in the yellow zone for 48 hours without improvement Advised patient to engage in light exercise as tolerated 3-5 days a week to aid in the the management of COPD Discussed the importance of adequate rest and management of fatigue with COPD Referral made to community resources care guide team for assistance with financial insecurity Assessed social determinant of health barriers   Patient Goals/Self-Care Activities: Take medications as prescribed   Attend all scheduled provider appointments Call pharmacy for medication refills 3-7 days in advance of running out of medications Call provider office for new concerns or questions  call office if I gain more than 2 pounds in one day or 5 pounds in one week use salt in moderation watch for swelling in feet, ankles and legs every day eat more whole grains, fruits and vegetables, lean meats and healthy fats track symptoms and what helps feel better or worse dress right for the weather, hot or cold - eliminate symptom triggers at home - eat healthy/prescribed diet: heart healthy - get at least 7 to 8 hours of sleep at night - use devices that will help like a cane, sock-puller or reacher - do breathing exercises every day

## 2021-08-13 NOTE — Patient Outreach (Addendum)
Medicaid Managed Care   Nurse Care Manager Note  08/13/2021 Name:  MONTANNA MCBAIN MRN:  [id number] DOB:  27-Jan-1956  [name] is an 66 y.o. year old female who is a primary patient of [place], [place] Dragon, FNP.  The [facility] Managed Care Coordination team was consulted for assistance with:    CHF COPD  Ms. Cochrane was given information about [employer] team services today. [place] Patient agreed to services and verbal consent obtained.  Engaged with patient by telephone for follow up visit in response to provider referral for case management and/or care coordination services.   Assessments/Interventions:  Review of past medical history, allergies, medications, health status, including review of consultants reports, laboratory and other test data, was performed as part of comprehensive evaluation and provision of chronic care management services.  SDOH (Social Determinants of Health) assessments and interventions performed: SDOH Interventions    Flowsheet Row Most Recent Value  SDOH Interventions   Financial Strain Interventions Other (Comment)  [Care Guide referral placed]  Physical Activity Interventions Other (Comments)  [Patient was having PT, had 4 treatments-stopped due to insurance not covering]  Social Connections Interventions Intervention Not Indicated       Care Plan  Allergies  Allergen Reactions   Amoxicillin Anaphylaxis    Breakout, mouth swells   Azithromycin Swelling and Rash    "swelling all over, high, thrush"   Cefdinir     Throat swelling after 3rd dose   Chantix [Varenicline] Other (See Comments)    "bad dreams, difficulty breathing"   Doxycycline Hyclate Anaphylaxis and Rash    swelling   Levofloxacin Anaphylaxis   Penicillins Anaphylaxis, Shortness Of Breath and Rash    "ALMOST DIED, ended up in hospital"  PCN reaction causing immediate rash, facial/tongue/throat swelling, SOB or lightheadedness with hypotension: YES PCN reaction  causing severe rash involving mucus membranes or skin necrosis: NO PCN reaction that required hospitalization YES Has patient had a PCN reaction occurring within the last 10 years: NO    Sulfonamide Derivatives Shortness Of Breath, Swelling and Rash    "break out from head to toe"   Toradol [Ketorolac Tromethamine] Other (See Comments)    Seizure    Clarithromycin Hives    swelling   Nortriptyline Swelling, Rash and Other (See Comments)    Per patient had "kidney problems" on this medication, could not use bathroom (urinary retention)   Paxlovid [Nirmatrelvir-Ritonavir] Swelling    Throat swelling   Codeine Nausea And Vomiting   Fluticasone-Salmeterol Other (See Comments)    REACTION: ulcers in mouth   Triptans Rash    Mouth breaks out and start itching    Medications Reviewed Today     Reviewed by [name], RN (Registered Nurse) on 08/13/21 at [place] List Status: <None>   Medication Order Taking? Sig Documenting Provider Last Dose Status Informant  acetaminophen (TYLENOL) 500 MG tablet [id number] Yes Take 1,000 mg by mouth daily as needed for headache. [provider] Taking Active Self  albuterol (PROAIR HFA) 108 (90 Base) MCG/ACT inhaler [id number] Yes INHALE TWO PUFFS INTO THE LUNGS EVERY 6 HOURS AS NEEDED FOR SHORTNESS OF BREATH [name], [name], MD Taking Active Self  albuterol (PROVENTIL) (2.5 MG/3ML) 0.083% nebulizer solution [id number] Yes USE 1 VIAL IN NEBULIZER EVERY 6 HOURS AS NEEDED FOR SHORTNESS OF BREATH [name], [name], MD Taking Active Self  ALLERGY RELIEF 10 MG tablet [id number] Yes TAKE ONE TABLET BY MOUTH DAILY [name], [name], MD Taking Active  aspirin EC 81 MG tablet [id number] Yes Take 81 mg by mouth daily. Swallow whole. [provider] Taking Active Self  Cholecalciferol (VITAMIN D3) 50 MCG (2000 UT) capsule [id number] Yes TAKE ONE CAPSULE BY MOUTH DAILY [name], [name], MD Taking Active Self   DULERA 200-5 MCG/ACT AERO [id number] Yes INHALE TWO PUFFS INTO THE LUNGS TWICE DAILY [name], [name], MD Taking Active   ferrous sulfate 324 (65 Fe) MG TBEC [id number] Yes TAKE ONE TABLET BY MOUTH EVERY OTHER DAY [name], [name], MD Taking Active   fluticasone (FLONASE) 50 MCG/ACT nasal spray [id number] Yes Place 1 spray into both nostrils daily. [provider] Taking Active Self  gabapentin (NEURONTIN) 100 MG capsule [id number] Yes Take 1 capsule (100 mg total) by mouth at bedtime. [name], [name], MD Taking Active Self           Med Note (Kourtni Stineman A   Thu Jul 11, 2021  3:41 PM) Taking three times a day  ibuprofen (ADVIL) 800 MG tablet [id number] Yes Take 800 mg by mouth every 8 (eight) hours as needed. [provider] Taking Active   lamoTRIgine (LAMICTAL) 200 MG tablet [id number] Yes Take 1.5 tablets (300 mg total) by mouth 2 (two) times daily. [name], NP Taking Active   meloxicam [facility]) 15 MG tablet [id number] Yes Take 15 mg by mouth daily. [provider] Taking Active Self           Med Note (Melvin Marmo A   Tue Apr 09, 2021 11:43 AM)    nystatin (MYCOSTATIN) 100000 UNIT/ML suspension [id number] Yes Take 5 mLs (500,000 Units total) by mouth 4 (four) times daily. [place], [place], PA-C Taking Active   omeprazole (PRILOSEC) 40 MG capsule [id number] No TAKE ONE CAPSULE BY MOUTH TWICE DAILY  Patient not taking: Reported on 08/13/2021   [name], [name], MD Not Taking Consider Medication Status and Discontinue (Change in therapy)   OXYGEN [id number]  Inhale 3 L into the lungs continuous. [provider]  Active Self  pantoprazole (PROTONIX) 40 MG tablet [id number] Yes Take 1 tablet (40 mg total) by mouth 2 (two) times daily. [name], [place] Taking Active Self  polyethylene glycol powder [facility]) powder [id number] Yes MIX 17 GMS IN LIQUID AND DRINK ONCE DAILY [name], MD Taking Active    QUEtiapine (SEROQUEL) 100 MG tablet [id number] Yes Take 1 tablet (100 mg total) by mouth at bedtime. [name], NP Taking Active   Sennosides (EX-LAX PO) [id number] Yes Take by mouth. [provider] Taking Active   sertraline (ZOLOFT) 50 MG tablet [id number] No TAKE ONE TABLET BY MOUTH DAILY  Patient not taking: Reported on 08/13/2021   [name], [name], MD Not Taking Active   simvastatin (ZOCOR) 40 MG tablet [id number] Yes TAKE ONE TABLET BY MOUTH AT BEDTIME [name], [name], MD Taking Active Self  topiramate (TOPAMAX) 100 MG tablet [id number] Yes Take 200 mg twice daily [name], NP Taking Active             Patient Active Problem List   Diagnosis Date Noted   Candida infection, esophageal ([place])    Ventricular diastolic dysfunction determined by echocardiography 04/12/2021   Multiple allergies 04/12/2021   Pressure injury of ankle, stage 2 ([place]) 01/31/2021   Dehydration 01/18/2021   Aspiration pneumonia ([place]) 12/30/2020   Dysphagia 12/20/2020   Radiculopathy affecting upper extremity 11/12/2020   Encounter for wheelchair assessment 05/16/2020   Shortness of breath 07/26/2019   Chronic pain of left knee 12/06/2018  Tobacco dependence 07/02/2018   Elevated serum creatinine 06/01/2018   Generalized weakness 05/15/2017   Chronic bronchitis ([place]) 01/14/2017   Claudication ([place]) 04/09/2016   Deviated septum 02/06/2016    Class: Chronic   Nasal turbinate hypertrophy 02/06/2016   Seizure disorder ([place]) 11/12/2015   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 05/23/2015   Rhinitis, chronic 04/25/2015   Dizziness 10/24/2014   Pulmonary hypertension ([place]) 65/99/3570   Diastolic CHF ([place]) 17/79/3903   BPPV (benign paroxysmal positional vertigo) 03/09/2013   Chronic respiratory failure ([place]) 11/01/2012   Left shoulder pain 06/27/2012   Headache 05/09/2012   Anxiety state 02/13/2010   Obstructive sleep apnea 02/19/2009    Vitamin D deficiency 01/21/2008   OSTEOPENIA 11/24/2007   Irritable bowel syndrome 11/17/2007   COPD (chronic obstructive pulmonary disease) ([place]) 09/24/2007   Hyperlipidemia 10/08/2006   OBESITY, NOS  BMI 33.8 10/08/2006   GLAUCOMA 10/08/2006   GASTROESOPHAGEAL REFLUX, NO ESOPHAGITIS 10/08/2006   HERNIA, HIATAL, NONCONGENITAL 10/08/2006   INCONTINENCE, STRESS, FEMALE 10/08/2006   DJD (degenerative joint disease) of knee 10/08/2006    Conditions to be addressed/monitored per PCP order:  CHF and COPD  Care Plan : RN Care Manager Plan of Care  Updates made by [name], RN since 08/13/2021 12:00 AM     Problem: Chronic health management needs related to CHF and COPD      Long-Range Goal: Develop of Plan of Care to address health management needs related to CHF and COPD   Start Date: 07/11/2021  Expected End Date: 10/09/2021  Priority: High  Note:   Current Barriers:  Chronic Disease Management support and education needs related to CHF and COPD-Ms. Sarti has several upcoming appointments. She has transportation, but is concerned about affording the copay. She is experiencing constipation and only getting results after taking Ex-lax. She reports a 15# weight gain since Christmas.  RNCM Clinical Goal(s):  Patient will verbalize understanding of plan for management of CHF and COPD as evidenced by patient verbalization and self monitoring activities take all medications exactly as prescribed and will call provider for medication related questions as evidenced by documentation in EMR    attend all scheduled medical appointments: 08/14/21 with Urology, 08/15/21 with Dr. Halford Chessman, 08/20/21 with PCP, 08/21/21 with Cardiology(Dr. Gwenlyn Found) and 08/23/21 with Dr. Ernst Bowler and call to reschedule missed Cardiology appointment as evidenced by documentation in EMR        demonstrate improved adherence to prescribed treatment plan for CHF and COPD as evidenced by physician notes documented in EMR  through  collaboration with RN Care manager, provider, and care team.   Interventions: Inter-disciplinary care team collaboration (see longitudinal plan of care) Evaluation of current treatment plan related to  self management and patient's adherence to plan as established by provider Provided information on constipation, discussed diet, advised patient discuss with PCP   Heart Failure Interventions:  (Status: Goal on Track (progressing): YES.)  Long Term Goal  Basic overview and discussion of pathophysiology of Heart Failure reviewed Discussed importance of daily weight and advised patient to weigh and record daily Reviewed role of diuretics in prevention of fluid overload and management of heart failure Assessed social determinant of health barriers Discussed upcoming Cardiology appointment on 08/21/21 Advised patient to discuss any new concerns or symptoms Call PCP today and report 15# weight gain  COPD: (Status: Goal on Track (progressing): YES.) Long Term Goal  Reviewed medications with patient, including use of prescribed maintenance and rescue inhalers, and provided instruction on  medication management and the importance of adherence Provided instruction about proper use of medications used for management of COPD including inhalers Advised patient to self assesses COPD action plan zone and make appointment with provider if in the yellow zone for 48 hours without improvement Advised patient to engage in light exercise as tolerated 3-5 days a week to aid in the the management of COPD Discussed the importance of adequate rest and management of fatigue with COPD Referral made to community resources care guide team for assistance with financial insecurity Assessed social determinant of health barriers   Patient Goals/Self-Care Activities: Take medications as prescribed   Attend all scheduled provider appointments Call pharmacy for medication refills 3-7 days in advance of running out of  medications Call provider office for new concerns or questions  call office if I gain more than 2 pounds in one day or 5 pounds in one week use salt in moderation watch for swelling in feet, ankles and legs every day eat more whole grains, fruits and vegetables, lean meats and healthy fats track symptoms and what helps feel better or worse dress right for the weather, hot or cold - eliminate symptom triggers at home - eat healthy/prescribed diet: heart healthy - get at least 7 to 8 hours of sleep at night - use devices that will help like a cane, sock-puller or reacher - do breathing exercises every day       Follow Up:  Patient agrees to Care Plan and Follow-up.  Plan: The Managed Medicaid care management team will reach out to the patient again over the next 30 days.  Date/time of next scheduled RN care management/care coordination outreach:  09/12/21 @ 3:45pm  Lurena Joiner RN, BSN [place] RN Care Coordinator

## 2021-08-15 ENCOUNTER — Institutional Professional Consult (permissible substitution): Payer: Medicaid Other | Admitting: Pulmonary Disease

## 2021-08-15 ENCOUNTER — Encounter: Payer: Self-pay | Admitting: Pulmonary Disease

## 2021-08-15 ENCOUNTER — Other Ambulatory Visit: Payer: Self-pay

## 2021-08-15 ENCOUNTER — Ambulatory Visit: Payer: Medicaid Other | Admitting: Pulmonary Disease

## 2021-08-15 VITALS — BP 122/74 | HR 82 | Temp 98.2°F | Ht 61.0 in | Wt 183.1 lb

## 2021-08-15 DIAGNOSIS — G4733 Obstructive sleep apnea (adult) (pediatric): Secondary | ICD-10-CM | POA: Diagnosis not present

## 2021-08-15 DIAGNOSIS — J449 Chronic obstructive pulmonary disease, unspecified: Secondary | ICD-10-CM | POA: Diagnosis not present

## 2021-08-15 MED ORDER — PREDNISONE 10 MG PO TABS: ORAL_TABLET | ORAL | 0 refills | Status: AC

## 2021-08-15 MED ORDER — BREZTRI AEROSPHERE 160-9-4.8 MCG/ACT IN AERO: 2.0000 | INHALATION_SPRAY | Freq: Two times a day (BID) | RESPIRATORY_TRACT | 5 refills | Status: DC

## 2021-08-15 MED ORDER — CLINDAMYCIN HCL 300 MG PO CAPS: 300.0000 mg | ORAL_CAPSULE | Freq: Three times a day (TID) | ORAL | 0 refills | Status: DC

## 2021-08-15 NOTE — Patient Instructions (Signed)
Clindamycin antibiotic 3 times per day for 5 days  Prednisone 10 mg pill >> 3 pills daily for 2 days, 2 pills daily for 2 days, 1 pill daily for 2 days  Breztri two puffs in the morning and two puffs in the evening, and rinse your mouth after each use  Stop using dulera once you start breztri  Will arrange for overnight oxygen test on CPAP and 2 liters and get a copy of your CPAP report from [facility]  Will arrange for pulmonary function test in [place] office and follow up with Dr. Halford Chessman or Nurse Practitioner in 6 weeks.

## 2021-08-15 NOTE — Progress Notes (Signed)
[name]  Chief Complaint  Patient presents with   Consult    Has seen Dr. Melvyn Novas.  Here for sleep consult Is currently on CPAP since May 2022      Past Surgical History:  She  has a past surgical history that includes Tubal ligation (2001); Knee arthroscopy (1990's); Carpal tunnel release (~ 2008); Shoulder open rotator cuff repair (~ 2010); Abdominal hysterectomy (2001); Total knee arthroplasty (08/23/2012); Total knee arthroplasty (08/23/2012); Cataract extraction (Bilateral); Colonoscopy with esophagogastroduodenoscopy (egd); Nasal septoplasty w/ turbinoplasty (Bilateral, 05/23/2016); Sinus endo with fusion (Right, 05/23/2016); Colonoscopy; Upper gastrointestinal endoscopy; Esophagogastroduodenoscopy (egd) with propofol (N/A, 04/16/2021); and biopsy (04/16/2021).  Past Medical History:  OA, COVID 29 March 2021, Diverticulosis, Epilepsy, GERD, Glaucoma, Rt leg DVT, HLD, HTN, IBS, Migraine headache, PNA, DM type 2  Constitutional:  BP 122/74 (BP Location: Left Arm, Patient Position: Sitting)    Pulse 82    Temp 98.2 F (36.8 C) (Temporal)    Ht 5\' 1"  (1.549 m)    Wt 183 lb 1.9 oz (83.1 kg)    SpO2 99% Comment: ra   BMI 34.60 kg/m   Brief Summary:  [name] is a 66 y.o. female smoker with COPD, obstructive sleep apnea, and chronic respiratory failure.      Subjective:   She reports having a sleep study a couple of years ago.  Results not available.  She has been using CPAP and gets supplies through [employer].  She was started on 2 liters supplemental oxygen after being in hospital in May 2022.  She uses this 24/7.    She is not very active.  Has to use a wheelchair.  Still smoking.  Used to smoke 2 ppd, but down to 1/2 ppd now.  Has cough with green sputum and intermittent wheezing.  Inhalers help some.  She has multiple antibiotic allergies.    Physical Exam:   Appearance - well kempt, wearing oxygen, in wheelchair  ENMT - no sinus  tenderness, no oral exudate, no LAN, Mallampati 4 airway, no stridor, has dentures  Respiratory - faint b/l wheeze and rhonchi  CV - s1s2 regular rate and rhythm, no murmurs  Ext - no clubbing, no edema  Skin - no rashes  Psych - normal mood and affect   Pulmonary testing:  Spirometry 10/27/13 >> FEV1 1.51 (66%), FEV1% 68  Chest Imaging:  HRCT chest 11/07/15 >> upper lobe scarring CT chest 05/11/21 >> centrilobular and paraseptal emphysema  Sleep Tests:    Cardiac Tests:  Echo 07/15/16 >> EF 55 to 60%, grade 1 DD  Social History:  She  reports that she has been smoking cigarettes. She started smoking about 46 years ago. She has a 40.00 pack-year smoking history. She quit smokeless tobacco use about 8 years ago. She reports that she does not drink alcohol and does not use drugs.  Family History:  Her family history includes Alcohol abuse in her father; Bipolar disorder in her son; Breast cancer in her maternal aunt; Diabetes in her brother, maternal aunt, maternal grandfather, maternal uncle, and mother; Emphysema in her father; Esophageal cancer in her maternal uncle; Heart disease in her maternal grandfather, sister, son, and another family member; Lung cancer in her father and mother; Muscular dystrophy in her son; Other in her sister; Seizures in her brother; Stroke in her mother.     Assessment/Plan:   COPD with chronic bronchitis and emphysema. - she has acute exacerbation, and has multiple antibiotic allergies -  will give course of prednisone and clindamycin - will change from dulera to breztri - prn albuterol - will arrange for PFT - don't think she needs additional imaging studies at this time  Obstructive sleep apnea. - reports compliance with therapy and benefit from CPAP - will try to get a copy of her download from [employer]  Chronic respiratory failure with hypoxia. - she uses 2 liters 24/7 - gets supplies from [employer] - will arrange for ONO with CPAP and 2  liters  Time Spent Involved in Patient Care on Day of Examination:  49 minutes  Follow up:   Patient Instructions  Clindamycin antibiotic 3 times per day for 5 days  Prednisone 10 mg pill >> 3 pills daily for 2 days, 2 pills daily for 2 days, 1 pill daily for 2 days  Breztri two puffs in the morning and two puffs in the evening, and rinse your mouth after each use  Stop using dulera once you start breztri  Will arrange for overnight oxygen test on CPAP and 2 liters and get a copy of your CPAP report from [employer]  Will arrange for pulmonary function test in [employer] office and follow up with Dr. Halford Chessman or Nurse Practitioner in 6 weeks.  Medication List:   Allergies as of 08/15/2021       Reactions   Amoxicillin Anaphylaxis   Breakout, mouth swells   Azithromycin Swelling, Rash   "swelling all over, high, thrush"   Cefdinir    Throat swelling after 3rd dose   Chantix [varenicline] Other (See Comments)   "bad dreams, difficulty breathing"   Doxycycline Hyclate Anaphylaxis, Rash   swelling   Levofloxacin Anaphylaxis   Penicillins Anaphylaxis, Shortness Of Breath, Rash   "ALMOST DIED, ended up in hospital"  PCN reaction causing immediate rash, facial/tongue/throat swelling, SOB or lightheadedness with hypotension: YES PCN reaction causing severe rash involving mucus membranes or skin necrosis: NO PCN reaction that required hospitalization YES Has patient had a PCN reaction occurring within the last 10 years: NO   Sulfonamide Derivatives Shortness Of Breath, Swelling, Rash   "break out from head to toe"   Toradol [ketorolac Tromethamine] Other (See Comments)   Seizure   Clarithromycin Hives   swelling   Nortriptyline Swelling, Rash, Other (See Comments)   Per patient had "kidney problems" on this medication, could not use bathroom (urinary retention)   Paxlovid [nirmatrelvir-ritonavir] Swelling   Throat swelling   Codeine Nausea And Vomiting   Fluticasone-salmeterol Other  (See Comments)   REACTION: ulcers in mouth   Triptans Rash   Mouth breaks out and start itching        Medication List        Accurate as of August 15, 2021 11:29 AM. If you have any questions, ask your nurse or doctor.          STOP taking these medications    Dulera 200-5 MCG/ACT Aero Generic drug: mometasone-formoterol Stopped by: Chesley Mires, MD       TAKE these medications    acetaminophen 500 MG tablet Commonly known as: TYLENOL Take 1,000 mg by mouth daily as needed for headache.   albuterol 108 (90 Base) MCG/ACT inhaler Commonly known as: ProAir HFA INHALE TWO PUFFS INTO THE LUNGS EVERY 6 HOURS AS NEEDED FOR SHORTNESS OF BREATH   albuterol (2.5 MG/3ML) 0.083% nebulizer solution Commonly known as: PROVENTIL USE 1 VIAL IN NEBULIZER EVERY 6 HOURS AS NEEDED FOR SHORTNESS OF BREATH   Allergy Relief 10 MG tablet  Generic drug: loratadine TAKE ONE TABLET BY MOUTH DAILY   aspirin EC 81 MG tablet Take 81 mg by mouth daily. Swallow whole.   Breztri Aerosphere 160-9-4.8 MCG/ACT Aero Generic drug: Budeson-Glycopyrrol-Formoterol Inhale 2 puffs into the lungs in the morning and at bedtime. Started by: Chesley Mires, MD   clindamycin 300 MG capsule Commonly known as: CLEOCIN Take 1 capsule (300 mg total) by mouth 3 (three) times daily. Started by: Chesley Mires, MD   EX-LAX PO Take by mouth.   ferrous sulfate 324 (65 Fe) MG Tbec TAKE ONE TABLET BY MOUTH EVERY OTHER DAY   fluticasone 50 MCG/ACT nasal spray Commonly known as: FLONASE Place 1 spray into both nostrils daily.   gabapentin 100 MG capsule Commonly known as: NEURONTIN Take 1 capsule (100 mg total) by mouth at bedtime.   ibuprofen 800 MG tablet Commonly known as: ADVIL Take 800 mg by mouth every 8 (eight) hours as needed.   lamoTRIgine 200 MG tablet Commonly known as: LAMICTAL Take 1.5 tablets (300 mg total) by mouth 2 (two) times daily.   meloxicam 15 MG tablet Commonly known as:  MOBIC Take 15 mg by mouth daily.   nystatin 100000 UNIT/ML suspension Commonly known as: MYCOSTATIN Take 5 mLs (500,000 Units total) by mouth 4 (four) times daily.   omeprazole 40 MG capsule Commonly known as: PRILOSEC TAKE ONE CAPSULE BY MOUTH TWICE DAILY   OXYGEN Inhale 3 L into the lungs continuous.   pantoprazole 40 MG tablet Commonly known as: PROTONIX Take 1 tablet (40 mg total) by mouth 2 (two) times daily.   polyethylene glycol powder 17 GM/SCOOP powder Commonly known as: GLYCOLAX/MIRALAX MIX 17 GMS IN LIQUID AND DRINK ONCE DAILY   predniSONE 10 MG tablet Commonly known as: DELTASONE Take 3 tablets (30 mg total) by mouth daily with breakfast for 2 days, THEN 2 tablets (20 mg total) daily with breakfast for 2 days, THEN 1 tablet (10 mg total) daily with breakfast for 2 days. Start taking on: August 15, 2021 Started by: Chesley Mires, MD   QUEtiapine 100 MG tablet Commonly known as: SEROQUEL Take 1 tablet (100 mg total) by mouth at bedtime.   sertraline 50 MG tablet Commonly known as: ZOLOFT TAKE ONE TABLET BY MOUTH DAILY   simvastatin 40 MG tablet Commonly known as: ZOCOR TAKE ONE TABLET BY MOUTH AT BEDTIME   topiramate 100 MG tablet Commonly known as: TOPAMAX Take 200 mg twice daily   Vitamin D3 50 MCG (2000 UT) capsule TAKE ONE CAPSULE BY MOUTH DAILY        Signature:  Chesley Mires, MD [place] Pager - [phone] 08/15/2021, 11:29 AM

## 2021-08-16 ENCOUNTER — Telehealth: Payer: Self-pay | Admitting: Pharmacy Technician

## 2021-08-16 ENCOUNTER — Other Ambulatory Visit (HOSPITAL_COMMUNITY): Payer: Self-pay

## 2021-08-16 NOTE — Telephone Encounter (Signed)
Patient Advocate Encounter  Received notification from [place] ([employer]) that prior authorization for BREZTRI 160MCG is required.   PA submitted on 1.6.23 [facility] Status is pending   [facility] will continue to follow  Luciano Cutter, CPhT Patient Advocate Phone: [phone] Fax:  [phone]

## 2021-08-20 DIAGNOSIS — E876 Hypokalemia: Secondary | ICD-10-CM | POA: Diagnosis not present

## 2021-08-20 DIAGNOSIS — Z8619 Personal history of other infectious and parasitic diseases: Secondary | ICD-10-CM | POA: Diagnosis not present

## 2021-08-20 DIAGNOSIS — I5030 Unspecified diastolic (congestive) heart failure: Secondary | ICD-10-CM | POA: Diagnosis not present

## 2021-08-20 DIAGNOSIS — L89312 Pressure ulcer of right buttock, stage 2: Secondary | ICD-10-CM | POA: Diagnosis not present

## 2021-08-20 DIAGNOSIS — J441 Chronic obstructive pulmonary disease with (acute) exacerbation: Secondary | ICD-10-CM | POA: Diagnosis not present

## 2021-08-20 DIAGNOSIS — M1990 Unspecified osteoarthritis, unspecified site: Secondary | ICD-10-CM | POA: Diagnosis not present

## 2021-08-20 DIAGNOSIS — N189 Chronic kidney disease, unspecified: Secondary | ICD-10-CM | POA: Diagnosis not present

## 2021-08-21 ENCOUNTER — Encounter: Payer: Self-pay | Admitting: Cardiovascular Disease

## 2021-08-21 ENCOUNTER — Ambulatory Visit (INDEPENDENT_AMBULATORY_CARE_PROVIDER_SITE_OTHER): Payer: Medicaid Other | Admitting: Cardiovascular Disease

## 2021-08-21 ENCOUNTER — Other Ambulatory Visit: Payer: Self-pay

## 2021-08-21 DIAGNOSIS — G4733 Obstructive sleep apnea (adult) (pediatric): Secondary | ICD-10-CM

## 2021-08-21 DIAGNOSIS — E782 Mixed hyperlipidemia: Secondary | ICD-10-CM | POA: Diagnosis not present

## 2021-08-21 DIAGNOSIS — F17200 Nicotine dependence, unspecified, uncomplicated: Secondary | ICD-10-CM | POA: Diagnosis not present

## 2021-08-21 DIAGNOSIS — I5033 Acute on chronic diastolic (congestive) heart failure: Secondary | ICD-10-CM

## 2021-08-21 LAB — HEPATIC FUNCTION PANEL
ALT: 6 IU/L (ref 0–32)
AST: 12 IU/L (ref 0–40)
Albumin: 4.1 g/dL (ref 3.8–4.8)
Alkaline Phosphatase: 114 IU/L (ref 44–121)
Bilirubin Total: <0.2 mg/dL (ref 0.0–1.2)
Bilirubin, Direct: <0.1 mg/dL (ref 0.00–0.40)
Total Protein: 7.2 g/dL (ref 6.0–8.5)

## 2021-08-21 LAB — LIPID PANEL
Chol/HDL Ratio: 2 ratio (ref 0.0–4.4)
Cholesterol, Total: 184 mg/dL (ref 100–199)
HDL: 91 mg/dL (ref >39)
LDL Chol Calc (NIH): 75 mg/dL (ref 0–99)
Triglycerides: 106 mg/dL (ref 0–149)
VLDL Cholesterol Cal: 18 mg/dL (ref 5–40)

## 2021-08-21 NOTE — Progress Notes (Signed)
08/21/2021 [place]   [birth year]  [id number]  Primary Physician [place], [place] Dragon, FNP Primary Cardiologist: Lorretta Harp MD Lupe Carney, [place]  HPI:  [name] is a 66 y.o.  mildly overweight married Caucasian female mother of 2 children, grandmother of 2 grandchildren whose husband, [name], is also a patient of mine. She was referred for preoperative clearance before elective outpatient sinus surgery. Risk factors include 40 pack years of tobacco continuing to smoke one pack per day with COPD. She has treated hyperlipidemia as well. She is stable because of a history of epilepsy and COPD she has never had a heart attack or stroke. 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 I performed Cardiolite stress testing on her in 2005 which is normal and she had stress testing as well/7/14 which was normal. 2-D echo was normal at that time as well without evidence of pulmonary hypertension or valvular heart disease.  Since I saw her 6 years ago she was admitted to [facility] with sepsis and diastolic heart failure.  A 2D echo revealed normal LV function with grade 1 diastolic dysfunction.  She is not on a diuretic.  She does continue to smoke 1/2 pack/day.  She is on CPAP/BiPAP at night and oxygen 24/7.  She is minimally ambulatory and for the most part wheelchair-bound because of instability on her feet.   Current Meds  Medication Sig   albuterol (PROAIR HFA) 108 (90 Base) MCG/ACT inhaler INHALE TWO PUFFS INTO THE LUNGS EVERY 6 HOURS AS NEEDED FOR SHORTNESS OF BREATH   albuterol (PROVENTIL) (2.5 MG/3ML) 0.083% nebulizer solution USE 1 VIAL IN NEBULIZER EVERY 6 HOURS AS NEEDED FOR SHORTNESS OF BREATH   ALLERGY RELIEF 10 MG tablet TAKE ONE TABLET BY MOUTH DAILY   aspirin EC 81 MG tablet Take 81 mg by mouth daily. Swallow whole.   Budeson-Glycopyrrol-Formoterol (BREZTRI AEROSPHERE) 160-9-4.8 MCG/ACT AERO Inhale 2 puffs into the  lungs in the morning and at bedtime.   Cholecalciferol (VITAMIN D3) 50 MCG (2000 UT) capsule TAKE ONE CAPSULE BY MOUTH DAILY   clindamycin (CLEOCIN) 300 MG capsule Take 1 capsule (300 mg total) by mouth 3 (three) times daily.   ferrous sulfate 324 (65 Fe) MG TBEC TAKE ONE TABLET BY MOUTH EVERY OTHER DAY   fluticasone (FLONASE) 50 MCG/ACT nasal spray Place 1 spray into both nostrils daily.   gabapentin (NEURONTIN) 100 MG capsule Take 1 capsule (100 mg total) by mouth at bedtime. (Patient taking differently: Take 100 mg by mouth 3 (three) times daily.)   ibuprofen (ADVIL) 800 MG tablet Take 800 mg by mouth every 8 (eight) hours as needed.   lamoTRIgine (LAMICTAL) 200 MG tablet Take 1.5 tablets (300 mg total) by mouth 2 (two) times daily.   nystatin (MYCOSTATIN) 100000 UNIT/ML suspension Take 5 mLs (500,000 Units total) by mouth 4 (four) times daily.   OXYGEN Inhale 3 L into the lungs continuous.   pantoprazole (PROTONIX) 40 MG tablet Take 1 tablet (40 mg total) by mouth 2 (two) times daily.   polyethylene glycol powder (GLYCOLAX/MIRALAX) powder MIX 17 GMS IN LIQUID AND DRINK ONCE DAILY   predniSONE (DELTASONE) 10 MG tablet Take 3 tablets (30 mg total) by mouth daily with breakfast for 2 days, THEN 2 tablets (20 mg total) daily with breakfast for 2 days, THEN 1 tablet (10 mg total) daily with breakfast for 2 days.   QUEtiapine (SEROQUEL) 100  MG tablet Take 1 tablet (100 mg total) by mouth at bedtime.   Sennosides (EX-LAX PO) Take by mouth.   sertraline (ZOLOFT) 50 MG tablet TAKE ONE TABLET BY MOUTH DAILY   simvastatin (ZOCOR) 40 MG tablet TAKE ONE TABLET BY MOUTH AT BEDTIME   topiramate (TOPAMAX) 100 MG tablet Take 200 mg twice daily     Allergies  Allergen Reactions   Amoxicillin Anaphylaxis    Breakout, mouth swells   Azithromycin Swelling and Rash    "swelling all over, high, thrush"   Cefdinir     Throat swelling after 3rd dose   Chantix [Varenicline] Other (See Comments)    "bad  dreams, difficulty breathing"   Doxycycline Hyclate Anaphylaxis and Rash    swelling   Levofloxacin Anaphylaxis   Penicillins Anaphylaxis, Shortness Of Breath and Rash    "ALMOST DIED, ended up in hospital"  PCN reaction causing immediate rash, facial/tongue/throat swelling, SOB or lightheadedness with hypotension: YES PCN reaction causing severe rash involving mucus membranes or skin necrosis: NO PCN reaction that required hospitalization YES Has patient had a PCN reaction occurring within the last 10 years: NO    Sulfonamide Derivatives Shortness Of Breath, Swelling and Rash    "break out from head to toe"   Toradol [Ketorolac Tromethamine] Other (See Comments)    Seizure    Clarithromycin Hives    swelling   Nortriptyline Swelling, Rash and Other (See Comments)    Per patient had "kidney problems" on this medication, could not use bathroom (urinary retention)   Paxlovid [Nirmatrelvir-Ritonavir] Swelling    Throat swelling   Codeine Nausea And Vomiting   Fluticasone-Salmeterol Other (See Comments)    REACTION: ulcers in mouth   Triptans Rash    Mouth breaks out and start itching    Social History   Socioeconomic History   Marital status: Married    Spouse name: [name]   Number of children: 2   Years of education: 12   Highest education level: Not on file  Occupational History   Occupation: disabled  Tobacco Use   Smoking status: Every Day    Packs/day: 1.00    Years: 40.00    Pack years: 40.00    Types: Cigarettes    Start date: 08/12/1975    Last attempt to quit: 12/30/2020    Years since quitting: 0.6   Smokeless tobacco: Former    Quit date: 08/23/2012   Tobacco comments:    1-2 CIGS A DAY NOW   Vaping Use   Vaping Use: Never used  Substance and Sexual Activity   Alcohol use: No    Alcohol/week: 0.0 standard drinks   Drug use: No   Sexual activity: Never  Other Topics Concern   Not on file  Social History Narrative   Patient lives at home with husband  [name], her son and his wife.    Patient has 2 children.    Patient has a high school education.    Patient is currently unemployed.    Patient is right handed.    Patient drinks 2 cups of caffeine per day.   Social Determinants of Health   Financial Resource Strain: High Risk   Difficulty of Paying Living Expenses: Hard  Food Insecurity: No Food Insecurity   Worried About Charity fundraiser in the Last Year: Never true   Ran Out of Food in the Last Year: Never true  Transportation Needs: Unmet Transportation Needs   Lack of Transportation (Medical): Yes  Lack of Transportation (Non-Medical): Yes  Physical Activity: Inactive   Days of Exercise per Week: 0 days   Minutes of Exercise per Session: 0 min  Stress: Not on file  Social Connections: Moderately Integrated   Frequency of Communication with Friends and Family: More than three times a week   Frequency of Social Gatherings with Friends and Family: More than three times a week   Attends Religious Services: Never   Marine scientist or Organizations: Yes   Attends Archivist Meetings: Never   Marital Status: Married  Human resources officer Violence: Not on file     Review of Systems: General: negative for chills, fever, night sweats or weight changes.  Cardiovascular: negative for chest pain, dyspnea on exertion, edema, orthopnea, palpitations, paroxysmal nocturnal dyspnea or shortness of breath Dermatological: negative for rash Respiratory: negative for cough or wheezing Urologic: negative for hematuria Abdominal: negative for nausea, vomiting, diarrhea, bright red blood per rectum, melena, or hematemesis Neurologic: negative for visual changes, syncope, or dizziness All other systems reviewed and are otherwise negative except as noted above.    Blood pressure (!) 100/54, pulse 72, resp. rate 20, height 5\' 1"  (1.549 m), weight 171 lb (77.6 kg), SpO2 98 %.  General appearance: alert and no distress Neck: no  adenopathy, no carotid bruit, no JVD, supple, symmetrical, trachea midline, and thyroid not enlarged, symmetric, no tenderness/mass/nodules Lungs: clear to auscultation bilaterally Heart: regular rate and rhythm, S1, S2 normal, no murmur, click, rub or gallop Extremities: extremities normal, atraumatic, no cyanosis or edema Pulses: 2+ and symmetric Skin: Skin color, texture, turgor normal. No rashes or lesions Neurologic: Grossly normal  EKG sinus rhythm at 72 without ST or T wave changes.  Personally reviewed this EKG.  ASSESSMENT AND PLAN:   Diastolic CHF ([place]) 2D echo showed normal LV function with grade 1 diastolic dysfunction.  I am going to repeat a 2D echocardiogram.  Hyperlipidemia History of hyperlipidemia on statin therapy.  We will recheck a lipid liver profile.  Obstructive sleep apnea History of obstructive sleep apnea on CPAP/BiPAP  Tobacco dependence Long history of tobacco use of 2 to 3 packs a day for over 40 years now smoking 1/2 pack/day recalcitrant to respect her medication.  She does have chronic COPD on home oxygen 24/7.     Lorretta Harp MD FACP,FACC,FAHA, [facility] 08/21/2021 11:03 AM

## 2021-08-21 NOTE — Assessment & Plan Note (Signed)
History of hyperlipidemia on statin therapy.  We will recheck a lipid liver profile 

## 2021-08-21 NOTE — Assessment & Plan Note (Signed)
History of obstructive sleep apnea on CPAP/BiPAP

## 2021-08-21 NOTE — Assessment & Plan Note (Signed)
2D echo showed normal LV function with grade 1 diastolic dysfunction.  I am going to repeat a 2D echocardiogram.

## 2021-08-21 NOTE — Patient Instructions (Signed)
Medication Instructions:  Your physician recommends that you continue on your current medications as directed. Please refer to the Current Medication list given to you today.  *If you need a refill on your cardiac medications before your next appointment, please call your pharmacy*   Lab Work: Your physician recommends that you have labs drawn today: lipid/liver profile  If you have labs (blood work) drawn today and your tests are completely normal, you will receive your results only by: [place] (if you have MyChart) OR A paper copy in the mail If you have any lab test that is abnormal or we need to change your treatment, we will call you to review the results.   Testing/Procedures: Your physician has requested that you have an echocardiogram. Echocardiography is a painless test that uses sound waves to create images of your heart. It provides your doctor with information about the size and shape of your heart and how well your hearts chambers and valves are working. This procedure takes approximately one hour. There are no restrictions for this procedure. This procedure will be done at [address].    Follow-Up: At [facility], you and your health needs are our priority.  As part of our continuing mission to provide you with exceptional heart care, we have created designated Provider Care Teams.  These Care Teams include your primary Cardiologist (physician) and Advanced Practice Providers (APPs -  Physician Assistants and Nurse Practitioners) who all work together to provide you with the care you need, when you need it.  We recommend signing up for the patient portal called "MyChart".  Sign up information is provided on this After Visit Summary.  MyChart is used to connect with patients for Virtual Visits (Telemedicine).  Patients are able to view lab/test results, encounter notes, upcoming appointments, etc.  Non-urgent messages can be sent to your provider as well.   To  learn more about what you can do with MyChart, go to [email or web address].    Your next appointment:   6 month(s)  The format for your next appointment:   In Person  Provider:   Coletta Memos, FNP, Fabian Sharp, PA-C, Sande Rives, PA-C, Caron Presume, PA-C, Jory Sims, DNP, ANP, or Almyra Deforest, PA-C      Then, Quay Burow, MD will plan to see you again in 12 month(s).

## 2021-08-21 NOTE — Assessment & Plan Note (Signed)
Long history of tobacco use of 2 to 3 packs a day for over 40 years now smoking 1/2 pack/day recalcitrant to respect her medication.  She does have chronic COPD on home oxygen 24/7.

## 2021-08-23 ENCOUNTER — Ambulatory Visit: Payer: Medicaid Other | Admitting: Allergy & Immunology

## 2021-08-26 ENCOUNTER — Ambulatory Visit: Payer: Medicaid Other | Admitting: Allergy & Immunology

## 2021-08-26 ENCOUNTER — Telehealth: Payer: Self-pay

## 2021-08-26 NOTE — Telephone Encounter (Signed)
° °  Telephone encounter was:  Successful.  08/26/2021 Name: [name] MRN: [id number] DOB: 18-Oct-1955  [name] is a 66 y.o. year old female who is a primary care patient of [place], [place] Dragon, FNP . The community resource team was consulted for assistance with  medical co-pays  Care guide performed the following interventions: Spoke with patient about financial assistance. Unfortunately I was unable to find any assistance with medical co-pays patient has Medicaid. Patient is receiving food stamps submitted [id number] referral to [facility] DSS/Low Income Energy Assistance Program with patient's permission.  Follow Up Plan:  Care guide will follow up with patient by phone over the next 4-13 days  Michele Judy, AAS Paralegal, [place] [employer]  [address], [place] [address] ??[email or web address]   ?? [phone]   [email or web address]

## 2021-08-27 ENCOUNTER — Encounter: Payer: Self-pay | Admitting: Family Medicine

## 2021-08-27 DIAGNOSIS — J9611 Chronic respiratory failure with hypoxia: Secondary | ICD-10-CM | POA: Diagnosis not present

## 2021-09-03 ENCOUNTER — Telehealth: Payer: Self-pay

## 2021-09-03 ENCOUNTER — Other Ambulatory Visit: Payer: Self-pay

## 2021-09-03 ENCOUNTER — Ambulatory Visit (HOSPITAL_COMMUNITY): Payer: Medicaid Other | Attending: Cardiology

## 2021-09-03 DIAGNOSIS — E782 Mixed hyperlipidemia: Secondary | ICD-10-CM | POA: Diagnosis not present

## 2021-09-03 DIAGNOSIS — I5033 Acute on chronic diastolic (congestive) heart failure: Secondary | ICD-10-CM | POA: Insufficient documentation

## 2021-09-03 DIAGNOSIS — F17200 Nicotine dependence, unspecified, uncomplicated: Secondary | ICD-10-CM | POA: Diagnosis not present

## 2021-09-03 DIAGNOSIS — G4733 Obstructive sleep apnea (adult) (pediatric): Secondary | ICD-10-CM | POA: Diagnosis not present

## 2021-09-03 LAB — ECHOCARDIOGRAM COMPLETE
Area-P 1/2: 3.72 cm2
S' Lateral: 3.2 cm

## 2021-09-03 NOTE — Telephone Encounter (Signed)
° °  Telephone encounter was:  Successful.  09/03/2021 Name: [name] MRN: [id number] DOB: [birth year]  [name] is a 66 y.o. year old female who is a primary care patient of [place], [place] Dragon, FNP . The community resource team was consulted for assistance with  utility assistance  Care guide performed the following interventions: [id number]/utility assistance message received from [facility] [place], spoke with client and the client stated at this time she wants to see what her other resources would pay and that she would come in to make an application if needed after that.   Follow Up Plan:  No further follow up planned at this time. The patient has been provided with needed resources.  Caidon Foti, AAS Paralegal, [place] [employer]  [address], [place] [address] ??[email or web address]   ?? [phone]   [email or web address]

## 2021-09-04 ENCOUNTER — Telehealth: Payer: Self-pay | Admitting: Cardiovascular Disease

## 2021-09-04 ENCOUNTER — Encounter: Payer: Self-pay | Admitting: Pulmonary Disease

## 2021-09-04 NOTE — Telephone Encounter (Signed)
Patient returned call

## 2021-09-04 NOTE — Telephone Encounter (Signed)
Attempted to call patient, left message for patient to call back to office.   

## 2021-09-04 NOTE — Telephone Encounter (Signed)
Spoke with patient regarding the following results. Patient made aware and patient verbalized understanding.   Lorretta Harp, MD  09/03/2021  2:47 PM EST     Normal LV systolic function with grade 1 diastolic dysfunction.   Advised patient to call back to office with any issues, questions, or concerns. Patient verbalized understanding.

## 2021-09-04 NOTE — Telephone Encounter (Signed)
Follow Up:     Patient is calling to see if her Echo results are ready from yesterday?

## 2021-09-07 DIAGNOSIS — R0902 Hypoxemia: Secondary | ICD-10-CM | POA: Diagnosis not present

## 2021-09-07 DIAGNOSIS — J449 Chronic obstructive pulmonary disease, unspecified: Secondary | ICD-10-CM | POA: Diagnosis not present

## 2021-09-07 DIAGNOSIS — J441 Chronic obstructive pulmonary disease with (acute) exacerbation: Secondary | ICD-10-CM | POA: Diagnosis not present

## 2021-09-11 ENCOUNTER — Telehealth: Payer: Self-pay | Admitting: Pulmonary Disease

## 2021-09-11 NOTE — Telephone Encounter (Signed)
Spoke with patient  regarding ONO results. They verbalized understanding. No further questions.  Nothing further needed at this time.  

## 2021-09-11 NOTE — Telephone Encounter (Signed)
ONO with CPAP and 2 liters 09/04/21 >> test time 6 hrs 33 min.  Baseline SpO2 94%, low SpO2 87%.  Spent 12 sec with SpO2 < 88%.   Please let her know her oxygen test looked good.  She should continue using CPAP and 2 liters oxygen at night.

## 2021-09-12 ENCOUNTER — Other Ambulatory Visit: Payer: Self-pay | Admitting: *Deleted

## 2021-09-12 ENCOUNTER — Other Ambulatory Visit: Payer: Self-pay

## 2021-09-12 ENCOUNTER — Other Ambulatory Visit: Payer: Self-pay | Admitting: Family Medicine

## 2021-09-12 DIAGNOSIS — Z5941 Food insecurity: Secondary | ICD-10-CM

## 2021-09-12 NOTE — Patient Outreach (Signed)
Medicaid Managed Care   Nurse Care Manager Note  09/12/2021 Name:  [name] MRN:  [id number] DOB:  [birth year]  [name] is an 66 y.o. year old female who is a primary patient of [name], Veronica Dragon, FNP.  The [facility] Managed Care Coordination team was consulted for assistance with:    CHF COPD  [name] was given information about [employer] team services today. [name] agreed to services and verbal consent obtained.  Engaged with patient by telephone for follow up visit in response to provider referral for case management and/or care coordination services.   Assessments/Interventions:  Review of past medical history, allergies, medications, health status, including review of consultants reports, laboratory and other test data, was performed as part of comprehensive evaluation and provision of chronic care management services.  SDOH (Social Determinants of Health) assessments and interventions performed: SDOH Interventions    Flowsheet Row Most Recent Value  SDOH Interventions   Food Insecurity Interventions Intervention Not Indicated  Housing Interventions Intervention Not Indicated       Care Plan  Allergies  Allergen Reactions   Amoxicillin Anaphylaxis    Breakout, mouth swells   Azithromycin Swelling and Rash    "swelling all over, high, thrush"   Cefdinir     Throat swelling after 3rd dose   Chantix [Varenicline] Other (See Comments)    "bad dreams, difficulty breathing"   Doxycycline Hyclate Anaphylaxis and Rash    swelling   Levofloxacin Anaphylaxis   Penicillins Anaphylaxis, Shortness Of Breath and Rash    "ALMOST DIED, ended up in hospital"  PCN reaction causing immediate rash, facial/tongue/throat swelling, SOB or lightheadedness with hypotension: YES PCN reaction causing severe rash involving mucus membranes or skin necrosis: NO PCN reaction that required hospitalization YES Has patient had a PCN reaction occurring within the  last 10 years: NO    Sulfonamide Derivatives Shortness Of Breath, Swelling and Rash    "break out from head to toe"   Toradol [Ketorolac Tromethamine] Other (See Comments)    Seizure    Clarithromycin Hives    swelling   Nortriptyline Swelling, Rash and Other (See Comments)    Per patient had "kidney problems" on this medication, could not use bathroom (urinary retention)   Paxlovid [Nirmatrelvir-Ritonavir] Swelling    Throat swelling   Codeine Nausea And Vomiting   Fluticasone-Salmeterol Other (See Comments)    REACTION: ulcers in mouth   Triptans Rash    Mouth breaks out and start itching    Medications Reviewed Today     Reviewed by Veronica Montane, RN (Registered Nurse) on 09/12/21 at 1413  Med List Status: <None>   Medication Order Taking? Sig Documenting Provider Last Dose Status Informant  albuterol (PROAIR HFA) 108 (90 Base) MCG/ACT inhaler [id number] Yes INHALE TWO PUFFS INTO THE LUNGS EVERY 6 HOURS AS NEEDED FOR SHORTNESS OF BREATH [name], [name], MD Taking Active Self  albuterol (PROVENTIL) (2.5 MG/3ML) 0.083% nebulizer solution [id number] Yes USE 1 VIAL IN NEBULIZER EVERY 6 HOURS AS NEEDED FOR SHORTNESS OF BREATH [name], [name], MD Taking Active Self  ALLERGY RELIEF 10 MG tablet [id number] Yes TAKE ONE TABLET BY MOUTH DAILY [name], [name], MD Taking Active   aspirin EC 81 MG tablet [id number] Yes Take 81 mg by mouth daily. Swallow whole. [provider] Taking Active Self  Budeson-Glycopyrrol-Formoterol (BREZTRI AEROSPHERE) 160-9-4.8 MCG/ACT AERO [id number] Yes Inhale 2 puffs into the lungs in the morning and at bedtime. Chesley Mires, MD Taking  Active   Cholecalciferol (VITAMIN D3) 50 MCG (2000 UT) capsule [id number] Yes TAKE ONE CAPSULE BY MOUTH DAILY [name], [name], MD Taking Active Self  clindamycin (CLEOCIN) 300 MG capsule [id number] Yes Take 1 capsule (300 mg total) by mouth 3 (three) times daily. Chesley Mires, MD Taking  Active   ferrous sulfate 324 (65 Fe) MG TBEC [id number] Yes TAKE ONE TABLET BY MOUTH EVERY OTHER DAY [name], [name], MD Taking Active   fluticasone (FLONASE) 50 MCG/ACT nasal spray [id number] Yes Place 1 spray into both nostrils daily. [provider] Taking Active Self  gabapentin (NEURONTIN) 100 MG capsule [id number] Yes Take 1 capsule (100 mg total) by mouth at bedtime.  Patient taking differently: Take 100 mg by mouth 3 (three) times daily.   [name], [name], MD Taking Active Self           Med Note (Shaynah Hund A   Thu Jul 11, 2021  3:41 PM) Taking three times a day  ibuprofen (ADVIL) 800 MG tablet [id number] Yes Take 800 mg by mouth every 8 (eight) hours as needed. [provider] Taking Active   lamoTRIgine (LAMICTAL) 200 MG tablet [id number] Yes Take 1.5 tablets (300 mg total) by mouth 2 (two) times daily. [name], NP Taking Active   nystatin (MYCOSTATIN) 100000 UNIT/ML suspension [id number] Yes Take 5 mLs (500,000 Units total) by mouth 4 (four) times daily. Lestine Box, PA-C Taking Active   OXYGEN [id number] Yes Inhale 3 L into the lungs continuous. [provider] Taking Active Self           Med Note (Kaysen Deal A   Thu Sep 12, 2021  2:12 PM) Using 2L continuous  pantoprazole (PROTONIX) 40 MG tablet [id number] Yes Take 1 tablet (40 mg total) by mouth 2 (two) times daily. [name], [place] Taking Active Self  polyethylene glycol powder [facility]) powder [id number] Yes MIX 17 GMS IN LIQUID AND DRINK ONCE DAILY [name], MD Taking Active   QUEtiapine (SEROQUEL) 100 MG tablet [id number] Yes Take 1 tablet (100 mg total) by mouth at bedtime. [name], NP Taking Active   Sennosides (EX-LAX PO) [id number] No Take by mouth.  Patient not taking: Reported on 09/12/2021   [provider] Not Taking Active   sertraline (ZOLOFT) 50 MG tablet [id number] No TAKE ONE TABLET BY MOUTH DAILY  Patient not  taking: Reported on 09/12/2021   Eulis Foster, MD Not Taking Active   simvastatin (ZOCOR) 40 MG tablet [id number] Yes TAKE ONE TABLET BY MOUTH AT BEDTIME [name], [name], MD Taking Active Self  topiramate (TOPAMAX) 100 MG tablet [id number] Yes Take 200 mg twice daily [name], NP Taking Active             Patient Active Problem List   Diagnosis Date Noted   Candida infection, esophageal ([place])    Ventricular diastolic dysfunction determined by echocardiography 04/12/2021   Multiple allergies 04/12/2021   Pressure injury of ankle, stage 2 ([place]) 01/31/2021   Dehydration 01/18/2021   Aspiration pneumonia ([place]) 12/30/2020   Dysphagia 12/20/2020   Radiculopathy affecting upper extremity 11/12/2020   Encounter for wheelchair assessment 05/16/2020   Shortness of breath 07/26/2019   Chronic pain of left knee 12/06/2018   Tobacco dependence 07/02/2018   Elevated serum creatinine 06/01/2018   Generalized weakness 05/15/2017   Chronic bronchitis ([place]) 01/14/2017   Claudication ([place]) 04/09/2016   Deviated septum 02/06/2016    Class: Chronic   Nasal turbinate hypertrophy 02/06/2016   Seizure  disorder ([place]) 11/12/2015   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 05/23/2015   Rhinitis, chronic 04/25/2015   Dizziness 10/24/2014   Pulmonary hypertension ([place]) 58/52/7782   Diastolic CHF ([place]) 42/35/3614   BPPV (benign paroxysmal positional vertigo) 03/09/2013   Chronic respiratory failure ([place]) 11/01/2012   Left shoulder pain 06/27/2012   Headache 05/09/2012   Anxiety state 02/13/2010   Obstructive sleep apnea 02/19/2009   Vitamin D deficiency 01/21/2008   OSTEOPENIA 11/24/2007   Irritable bowel syndrome 11/17/2007   COPD (chronic obstructive pulmonary disease) ([place]) 09/24/2007   Hyperlipidemia 10/08/2006   OBESITY, NOS  BMI 33.8 10/08/2006   GLAUCOMA 10/08/2006   GASTROESOPHAGEAL REFLUX, NO ESOPHAGITIS 10/08/2006   HERNIA, HIATAL,  NONCONGENITAL 10/08/2006   INCONTINENCE, STRESS, FEMALE 10/08/2006   DJD (degenerative joint disease) of knee 10/08/2006    Conditions to be addressed/monitored per PCP order:  CHF and COPD  Care Plan : RN Care Manager Plan of Care  Updates made by Veronica Montane, RN since 09/12/2021 12:00 AM     Problem: Chronic health management needs related to CHF and COPD      Long-Range Goal: Develop of Plan of Care to address health management needs related to CHF and COPD   Start Date: 07/11/2021  Expected End Date: 10/09/2021  Priority: High  Note:   Current Barriers:  Chronic Disease Management support and education needs related to CHF and COPD-[name] reports increased wheezing this week. She is scheduled for Allergy and Asthma on 09/23/21 and PFT on 09/27/21. She is concerned about food benefits decreasing this month and would like a list of food pantries.  RNCM Clinical Goal(s):  Patient will verbalize understanding of plan for management of CHF and COPD as evidenced by patient verbalization and self monitoring activities take all medications exactly as prescribed and will call provider for medication related questions as evidenced by documentation in EMR    attend all scheduled medical appointments: 09/23/21 @ Asthma and Allergy and 09/27/21 for PFT as evidenced by documentation in EMR        demonstrate improved adherence to prescribed treatment plan for CHF and COPD as evidenced by physician notes documented in EMR  through collaboration with RN Care manager, provider, and care team.   Interventions: Inter-disciplinary care team collaboration (see longitudinal plan of care) Evaluation of current treatment plan related to  self management and patient's adherence to plan as established by provider Care Guide referral for local food pantries Advised patient to call [facility] member services for enhanced member benefits   Heart Failure Interventions:  (Status: Goal on Track (progressing): YES.)   Long Term Goal  Reviewed Heart Failure Action Plan in depth and provided written copy Discussed importance of daily weight and advised patient to weigh and record daily Assessed social determinant of health barriers Advised patient to discuss any new concerns or symptoms  COPD: (Status: Goal on Track (progressing): YES.) Long Term Goal  Reviewed medications with patient, including use of prescribed maintenance and rescue inhalers, and provided instruction on medication management and the importance of adherence Advised patient to track and manage COPD triggers Advised patient to self assesses COPD action plan zone and make appointment with provider if in the yellow zone for 48 hours without improvement Provided education about and advised patient to utilize infection prevention strategies to reduce risk of respiratory infection Discussed the importance of adequate rest and management of fatigue with COPD Assessed social determinant of health barriers   Patient Goals/Self-Care Activities:  Take medications as prescribed   Attend all scheduled provider appointments Call pharmacy for medication refills 3-7 days in advance of running out of medications Call provider office for new concerns or questions  call office if I gain more than 2 pounds in one day or 5 pounds in one week use salt in moderation watch for swelling in feet, ankles and legs every day eat more whole grains, fruits and vegetables, lean meats and healthy fats track symptoms and what helps feel better or worse dress right for the weather, hot or cold - eliminate symptom triggers at home - eat healthy/prescribed diet: heart healthy - get at least 7 to 8 hours of sleep at night - use devices that will help like a cane, sock-puller or reacher - do breathing exercises every day       Follow Up:  Patient agrees to Care Plan and Follow-up.  Plan: The Managed Medicaid care management team will reach out to the patient again  over the next 30 days.  Date/time of next scheduled RN care management/care coordination outreach:  10/08/21 @ 2:30pm  Lurena Joiner RN, BSN [place] RN Care Coordinator

## 2021-09-12 NOTE — Patient Instructions (Signed)
Visit Information  Ms. Falwell was given information about Medicaid Managed Care team care coordination services as a part of their [place] Medicaid benefit. [place] verbally consented to engagement with the [facility] Managed Care team.   If you are experiencing a medical emergency, please call 911 or report to your local emergency department or urgent care.   If you have a non-emergency medical problem during routine business hours, please contact your provider's office and ask to speak with a nurse.   For questions related to your [facility], please call: [phone] or visit the homepage here: [email or web address]  If you would like to schedule transportation through your [facility], please call the following number at least 2 days in advance of your appointment: [phone].   Call the [place] at [phone], at any time, 24 hours a day, 7 days a week. If you are in danger or need immediate medical attention call 911.  If you would like help to quit smoking, call 1-800-QUIT-NOW 504-373-0180) OR Espaol: 1-855-Djelo-Ya (5-176-160-7371) o para ms informacin haga clic aqu or Text READY to 200-400 to register via text  Ms. Alvelo - following are the goals we discussed in your visit today:   Goals Addressed   None     Please see education materials related to COPD and CHF provided by MyChart link.  The patient has MyChart and can view provided education  Telephone follow up appointment with Managed Medicaid care management team member scheduled for:10/08/21 @ 2:30pm  Lurena Joiner RN, BSN [place] RN Care Coordinator   Following is a copy of your plan of care:  Care Plan : RN Care Manager Plan of Care  Updates made by [name], RN since 09/12/2021 12:00 AM     Problem: Chronic health  management needs related to CHF and COPD      Long-Range Goal: Develop of Plan of Care to address health management needs related to CHF and COPD   Start Date: 07/11/2021  Expected End Date: 10/09/2021  Priority: High  Note:   Current Barriers:  Chronic Disease Management support and education needs related to CHF and COPD-Ms. Noyce reports increased wheezing this week. She is scheduled for Allergy and Asthma on 09/23/21 and PFT on 09/27/21. She is concerned about food benefits decreasing this month and would like a list of food pantries.  RNCM Clinical Goal(s):  Patient will verbalize understanding of plan for management of CHF and COPD as evidenced by patient verbalization and self monitoring activities take all medications exactly as prescribed and will call provider for medication related questions as evidenced by documentation in EMR    attend all scheduled medical appointments: 09/23/21 @ Asthma and Allergy and 09/27/21 for PFT as evidenced by documentation in EMR        demonstrate improved adherence to prescribed treatment plan for CHF and COPD as evidenced by physician notes documented in EMR  through collaboration with RN Care manager, provider, and care team.   Interventions: Inter-disciplinary care team collaboration (see longitudinal plan of care) Evaluation of current treatment plan related to  self management and patient's adherence to plan as established by provider Care Guide referral for local food pantries Advised patient to call [facility] member services for enhanced member benefits   Heart Failure Interventions:  (Status: Goal on Track (progressing): YES.)  Long Term Goal  Reviewed Heart Failure Action Plan in depth  and provided written copy Discussed importance of daily weight and advised patient to weigh and record daily Assessed social determinant of health barriers Advised patient to discuss any new concerns or symptoms  COPD: (Status: Goal on Track (progressing): YES.) Long  Term Goal  Reviewed medications with patient, including use of prescribed maintenance and rescue inhalers, and provided instruction on medication management and the importance of adherence Advised patient to track and manage COPD triggers Advised patient to self assesses COPD action plan zone and make appointment with provider if in the yellow zone for 48 hours without improvement Provided education about and advised patient to utilize infection prevention strategies to reduce risk of respiratory infection Discussed the importance of adequate rest and management of fatigue with COPD Assessed social determinant of health barriers   Patient Goals/Self-Care Activities: Take medications as prescribed   Attend all scheduled provider appointments Call pharmacy for medication refills 3-7 days in advance of running out of medications Call provider office for new concerns or questions  call office if I gain more than 2 pounds in one day or 5 pounds in one week use salt in moderation watch for swelling in feet, ankles and legs every day eat more whole grains, fruits and vegetables, lean meats and healthy fats track symptoms and what helps feel better or worse dress right for the weather, hot or cold - eliminate symptom triggers at home - eat healthy/prescribed diet: heart healthy - get at least 7 to 8 hours of sleep at night - use devices that will help like a cane, sock-puller or reacher - do breathing exercises every day

## 2021-09-13 ENCOUNTER — Telehealth: Payer: Self-pay | Admitting: Internal Medicine

## 2021-09-13 ENCOUNTER — Telehealth: Payer: Self-pay

## 2021-09-13 NOTE — Telephone Encounter (Signed)
° °  Telephone encounter was:  Successful.  09/13/2021 Name: [name] MRN: [id number] DOB: 1956/08/07  [name] is a 66 y.o. year old female who is a primary care patient of [place], [place] Dragon, FNP . The community resource team was consulted for assistance with Food Insecurity and Financial Difficulties related to Financial Strain  Care guide performed the following interventions: Patient provided with information about care guide support team and interviewed to confirm resource needs. food stamps will be reduced and financially its hard to pay the bills  Follow Up Plan:  Care guide will follow up with patient by phone over the next week.

## 2021-09-13 NOTE — Telephone Encounter (Signed)
° °  Telephone encounter was:  Unsuccessful.  09/13/2021 Name: [name] MRN: [id number] DOB: 07/07/1956   Call made today to assist with:  Food Insecurity and Financial Difficulties related to financial stain  Mailing resources to patient      [place], Care Management  [phone] [address], [place], [place] [address] Phone: [phone] Email: [email or web address]

## 2021-09-16 NOTE — Telephone Encounter (Signed)
Community message sent to [employer] and waiting on a response.

## 2021-09-16 NOTE — Telephone Encounter (Signed)
We received an order for a download.  We don't show a PAP device in our system for this patient so [name] called the patient to find out what device she has and the patient said she has an Astral non-invasive vent that she received through [place].  We are not able to do a download on the device provided by [employer].   This is the response from [employer]. DME  is [employer].

## 2021-09-19 ENCOUNTER — Other Ambulatory Visit: Payer: Self-pay | Admitting: Family Medicine

## 2021-09-19 ENCOUNTER — Telehealth: Payer: Self-pay

## 2021-09-19 NOTE — Telephone Encounter (Signed)
° °  Telephone encounter was:  Unsuccessful.  09/19/2021 Name: [name] MRN: [id number] DOB: 10-Aug-1956  Unsuccessful outbound call made today to assist with:  Food Insecurity and Financial Difficulties related to financial strain  Outreach Attempt:  2nd Attempt  A HIPAA compliant voice message was left requesting a return call.  Instructed patient to call back at  at earliest convenience.[place], Care Management  [phone] [address], [place], [place] [address] Phone: [phone] Email: [email or web address]

## 2021-09-20 ENCOUNTER — Telehealth: Payer: Self-pay

## 2021-09-20 NOTE — Telephone Encounter (Signed)
° °  Telephone encounter was:  Successful.  09/20/2021 Name: [name] MRN: [id number] DOB: [birth year]  [name] is a 66 y.o. year old female who is a primary care patient of [place], [place] Dragon, FNP . The community resource team was consulted for assistance with Food Insecurity and Financial Difficulties related to financial strain  Care guide performed the following interventions: Patient provided with information about care guide support team and interviewed to confirm resource needs.Patient confirmed she recieved resouces in the mail today  Follow Up Plan:  No further follow up planned at this time. The patient has been provided with needed resources.    [place], Care Management  [phone] [address], [place], [place] [address] Phone: [phone] Email: [email or web address]

## 2021-09-23 ENCOUNTER — Other Ambulatory Visit: Payer: Self-pay

## 2021-09-23 ENCOUNTER — Encounter: Payer: Self-pay | Admitting: Allergy & Immunology

## 2021-09-23 ENCOUNTER — Ambulatory Visit (INDEPENDENT_AMBULATORY_CARE_PROVIDER_SITE_OTHER): Payer: Medicaid Other | Admitting: Allergy & Immunology

## 2021-09-23 VITALS — BP 120/66 | HR 93 | Temp 97.7°F | Resp 18 | Ht 61.0 in | Wt 173.0 lb

## 2021-09-23 DIAGNOSIS — Z881 Allergy status to other antibiotic agents status: Secondary | ICD-10-CM | POA: Diagnosis not present

## 2021-09-23 DIAGNOSIS — J449 Chronic obstructive pulmonary disease, unspecified: Secondary | ICD-10-CM | POA: Diagnosis not present

## 2021-09-23 DIAGNOSIS — J3089 Other allergic rhinitis: Secondary | ICD-10-CM | POA: Diagnosis not present

## 2021-09-23 DIAGNOSIS — Z20822 Contact with and (suspected) exposure to covid-19: Secondary | ICD-10-CM | POA: Diagnosis not present

## 2021-09-23 DIAGNOSIS — J310 Chronic rhinitis: Secondary | ICD-10-CM

## 2021-09-23 DIAGNOSIS — T7840XD Allergy, unspecified, subsequent encounter: Secondary | ICD-10-CM

## 2021-09-23 NOTE — Progress Notes (Signed)
NEW PATIENT  Date of Service/Encounter:  09/23/21  Consult requested by: Veronica Coaster, FNP   Assessment:   Allergic reaction - with multiple drug allergies listed (addressing the penicillin allergy first with testing and possible challenge and then moving on to other challenges)  Chronic rhinitis - getting environmental allergy testing via the blood  Plan/Recommendations:   1. Allergic reaction - We are going to do penicillin testing and then a challenge if this is negative. - We are going to try to address each of the other antibiotic allergies in each visit.   2. Chronic rhinitis - We will get blood work to look for environmental allergies since we are getting blood anyway. - Continue with Flonase and Zyrtec for now.  3. Return in about 6 weeks (around 11/04/2021) for PENICILLIN TESTING.     This note in its entirety was forwarded to the Provider who requested this consultation.  Subjective:   [name] is a 66 y.o. female presenting today for evaluation of  Chief Complaint  Patient presents with   Allergy Testing    [name] has a history of the following: Patient Active Problem List   Diagnosis Date Noted   Candida infection, esophageal ([place])    Ventricular diastolic dysfunction determined by echocardiography 04/12/2021   Multiple allergies 04/12/2021   Pressure injury of ankle, stage 2 ([place]) 01/31/2021   Dehydration 01/18/2021   Aspiration pneumonia ([place]) 12/30/2020   Dysphagia 12/20/2020   Radiculopathy affecting upper extremity 11/12/2020   Encounter for wheelchair assessment 05/16/2020   Shortness of breath 07/26/2019   Chronic pain of left knee 12/06/2018   Tobacco dependence 07/02/2018   Elevated serum creatinine 06/01/2018   Generalized weakness 05/15/2017   Chronic bronchitis ([place]) 01/14/2017   Claudication ([place]) 04/09/2016   Deviated septum 02/06/2016    Class: Chronic   Nasal turbinate hypertrophy 02/06/2016   Seizure disorder ([place])  11/12/2015   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 05/23/2015   Rhinitis, chronic 04/25/2015   Dizziness 10/24/2014   Pulmonary hypertension ([place]) 16/05/9603   Diastolic CHF ([place]) 54/04/8118   BPPV (benign paroxysmal positional vertigo) 03/09/2013   Chronic respiratory failure ([place]) 11/01/2012   Left shoulder pain 06/27/2012   Headache 05/09/2012   Anxiety state 02/13/2010   Obstructive sleep apnea 02/19/2009   Vitamin D deficiency 01/21/2008   OSTEOPENIA 11/24/2007   Irritable bowel syndrome 11/17/2007   COPD (chronic obstructive pulmonary disease) ([place]) 09/24/2007   Hyperlipidemia 10/08/2006   OBESITY, NOS  BMI 33.8 10/08/2006   GLAUCOMA 10/08/2006   GASTROESOPHAGEAL REFLUX, NO ESOPHAGITIS 10/08/2006   HERNIA, HIATAL, NONCONGENITAL 10/08/2006   INCONTINENCE, STRESS, FEMALE 10/08/2006   DJD (degenerative joint disease) of knee 10/08/2006    History obtained from: chart review and patient and her husband.   [place] was referred by Veronica Coaster, FNP.     [name] is a 66 y.o. female presenting for an evaluation of multiple drug allergies as well as environmental allergies .   Asthma/Respiratory Symptom History: She is managed by Dr. Melvyn [name] and Dr. Halford [name]. She hashas COPD for 10-15 years. Prior to that, she was managed by [facility]. She is on prednisone more than 4 times per year. She was admitted twice in the last year for COPD.  She is on supplemental oxygen.  She continues to smoke.  She is scheduled for pulmonary function testing.  Allergic Rhinitis Symptom History: She has had environmental allergies for a long time. She  has been on this for 5-6 years. This does tend to help. She is on Claritin and has been on that for a long period of time. Symptoms are the worse mostly in the winter months. She has had dogs and cats for years. Pollen is bad in the spring time as well.   She does not get antibiotics often. She does get bronchitis a number of  times per year. She typically sees her PCP for management of the bronchitis. She gets antibiotics and steroids and then she is fine. To figure out when she gets for her episodes of bronchitis, as I do not think her primary care provider is on Epic. She is unsure what she got treated for for her most recent bronchitis.  This might of been clarithromycin or clindamycin, which she cannot remember.  She has a number of drug allergies.  Penicillin: She was a baby and wound up in the hospital for her penicillin reaction. She has not had any accidental exposures since then.   Amoxicillin: She had this a couple of years ago and she had breakouts. This took two doses of it.  It is unclear why she got amoxicillin even though she had a penicillin drug allergy label.  Levofloxacin: She reports that she broke out her mouth out. She said it was thrush and she took nystatin for treatment of this. She did have throat swelling and she managed this at home. She took 3 doses before the symptoms started.   Cefdinir: She does not remember the cefdinir reaction at all.  She seems very confused about the name cefdinir at all.  She has never heard of [facility].          Otherwise, there is no history of other atopic diseases, including food allergies, stinging insect allergies, or contact dermatitis. There is no significant infectious history. Vaccinations are up to date.    Past Medical History: Patient Active Problem List   Diagnosis Date Noted   Candida infection, esophageal ([place])    Ventricular diastolic dysfunction determined by echocardiography 04/12/2021   Multiple allergies 04/12/2021   Pressure injury of ankle, stage 2 ([place]) 01/31/2021   Dehydration 01/18/2021   Aspiration pneumonia ([place]) 12/30/2020   Dysphagia 12/20/2020   Radiculopathy affecting upper extremity 11/12/2020   Encounter for wheelchair assessment 05/16/2020   Shortness of breath 07/26/2019   Chronic pain of left knee 12/06/2018    Tobacco dependence 07/02/2018   Elevated serum creatinine 06/01/2018   Generalized weakness 05/15/2017   Chronic bronchitis ([place]) 01/14/2017   Claudication ([place]) 04/09/2016   Deviated septum 02/06/2016    Class: Chronic   Nasal turbinate hypertrophy 02/06/2016   Seizure disorder ([place]) 11/12/2015   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 05/23/2015   Rhinitis, chronic 04/25/2015   Dizziness 10/24/2014   Pulmonary hypertension ([place]) 78/29/5621   Diastolic CHF ([place]) 30/86/5784   BPPV (benign paroxysmal positional vertigo) 03/09/2013   Chronic respiratory failure ([place]) 11/01/2012   Left shoulder pain 06/27/2012   Headache 05/09/2012   Anxiety state 02/13/2010   Obstructive sleep apnea 02/19/2009   Vitamin D deficiency 01/21/2008   OSTEOPENIA 11/24/2007   Irritable bowel syndrome 11/17/2007   COPD (chronic obstructive pulmonary disease) ([place]) 09/24/2007   Hyperlipidemia 10/08/2006   OBESITY, NOS  BMI 33.8 10/08/2006   GLAUCOMA 10/08/2006   GASTROESOPHAGEAL REFLUX, NO ESOPHAGITIS 10/08/2006   HERNIA, HIATAL, NONCONGENITAL 10/08/2006   INCONTINENCE, STRESS, FEMALE 10/08/2006   DJD (degenerative joint disease) of knee 10/08/2006  Medication List:  Allergies as of 09/23/2021       Reactions   Amoxicillin Anaphylaxis   Breakout, mouth swells   Azithromycin Swelling, Rash   "swelling all over, hives, thrush"   Cefdinir    Throat swelling after 3rd dose   Chantix [varenicline] Other (See Comments)   "bad dreams, difficulty breathing"   Doxycycline Hyclate Anaphylaxis, Rash   swelling   Levofloxacin Anaphylaxis   Penicillins Anaphylaxis, Shortness Of Breath, Rash   "ALMOST DIED, ended up in hospital"  PCN reaction causing immediate rash, facial/tongue/throat swelling, SOB or lightheadedness with hypotension: YES PCN reaction causing severe rash involving mucus membranes or skin necrosis: NO PCN reaction that required hospitalization YES Has patient  had a PCN reaction occurring within the last 10 years: NO   Sulfonamide Derivatives Shortness Of Breath, Swelling, Rash   "break out from head to toe"   Toradol [ketorolac Tromethamine] Other (See Comments)   Seizure   Clarithromycin Hives   swelling   Nortriptyline Swelling, Rash, Other (See Comments)   Per patient had "kidney problems" on this medication, could not use bathroom (urinary retention)   Paxlovid [nirmatrelvir-ritonavir] Swelling   Throat swelling   Codeine Nausea And Vomiting   Fluticasone-salmeterol Other (See Comments)   REACTION: ulcers in mouth   Triptans Rash   Mouth breaks out and start itching        Medication List        Accurate as of September 23, 2021 11:59 PM. If you have any questions, ask your nurse or doctor.          STOP taking these medications    clindamycin 300 MG capsule Commonly known as: CLEOCIN Stopped by: Valentina Shaggy, MD   sertraline 50 MG tablet Commonly known as: ZOLOFT Stopped by: Valentina Shaggy, MD       TAKE these medications    albuterol 108 (90 Base) MCG/ACT inhaler Commonly known as: ProAir HFA INHALE TWO PUFFS INTO THE LUNGS EVERY 6 HOURS AS NEEDED FOR SHORTNESS OF BREATH   albuterol (2.5 MG/3ML) 0.083% nebulizer solution Commonly known as: PROVENTIL USE 1 VIAL IN NEBULIZER EVERY 6 HOURS AS NEEDED FOR SHORTNESS OF BREATH   Allergy Relief 10 MG tablet Generic drug: loratadine TAKE ONE TABLET BY MOUTH DAILY   aspirin EC 81 MG tablet Take 81 mg by mouth daily. Swallow whole.   Breztri Aerosphere 160-9-4.8 MCG/ACT Aero Generic drug: Budeson-Glycopyrrol-Formoterol Inhale 2 puffs into the lungs in the morning and at bedtime.   EX-LAX PO Take by mouth.   ferrous sulfate 324 (65 Fe) MG Tbec TAKE ONE TABLET BY MOUTH EVERY OTHER DAY   fluticasone 50 MCG/ACT nasal spray Commonly known as: FLONASE Place 1 spray into both nostrils daily.   gabapentin 100 MG capsule Commonly known as:  NEURONTIN Take 1 capsule (100 mg total) by mouth at bedtime. What changed: when to take this   ibuprofen 800 MG tablet Commonly known as: ADVIL Take 800 mg by mouth every 8 (eight) hours as needed.   lamoTRIgine 200 MG tablet Commonly known as: LAMICTAL Take 1.5 tablets (300 mg total) by mouth 2 (two) times daily.   nystatin 100000 UNIT/ML suspension Commonly known as: MYCOSTATIN Take 5 mLs (500,000 Units total) by mouth 4 (four) times daily.   OXYGEN Inhale 3 L into the lungs continuous.   pantoprazole 40 MG tablet Commonly known as: PROTONIX Take 1 tablet (40 mg total) by mouth 2 (two) times daily.   polyethylene glycol powder  17 GM/SCOOP powder Commonly known as: GLYCOLAX/MIRALAX MIX 17 GMS IN LIQUID AND DRINK ONCE DAILY   QUEtiapine 100 MG tablet Commonly known as: SEROQUEL Take 1 tablet (100 mg total) by mouth at bedtime.   simvastatin 40 MG tablet Commonly known as: ZOCOR TAKE ONE TABLET BY MOUTH AT BEDTIME   topiramate 100 MG tablet Commonly known as: TOPAMAX Take 200 mg twice daily   Vitamin D3 50 MCG (2000 UT) capsule TAKE ONE CAPSULE BY MOUTH DAILY        Birth History: non-contributory  Developmental History: non-contributory  Past Surgical History: Past Surgical History:  Procedure Laterality Date   ABDOMINAL HYSTERECTOMY  2001   "partial"   BIOPSY  04/16/2021   Procedure: BIOPSY;  Surgeon: Mauri Pole, MD;  Location: [facility];  Service: Endoscopy;;   CARPAL TUNNEL RELEASE  ~ 2008   right   CATARACT EXTRACTION Bilateral    COLONOSCOPY     COLONOSCOPY WITH ESOPHAGOGASTRODUODENOSCOPY (EGD)     ESOPHAGOGASTRODUODENOSCOPY (EGD) WITH PROPOFOL N/A 04/16/2021   Procedure: ESOPHAGOGASTRODUODENOSCOPY (EGD) WITH PROPOFOL;  Surgeon: Mauri Pole, MD;  Location: [facility];  Service: Endoscopy;  Laterality: N/A;   KNEE ARTHROSCOPY  1990's   left   NASAL SEPTOPLASTY W/ TURBINOPLASTY Bilateral 05/23/2016   Procedure: NASAL  SEPTOPLASTY WITH TURBINATE REDUCTION;  Surgeon: Jerrell Belfast, MD;  Location: [facility];  Service: ENT;  Laterality: Bilateral;   SHOULDER OPEN ROTATOR CUFF REPAIR  ~ 2010   left   SINOSCOPY     SINUS ENDO WITH FUSION Right 05/23/2016   Procedure: LIMITED RIGHT ENDOSCOPIC SINUS SURGERY;  Surgeon: Jerrell Belfast, MD;  Location: [facility];  Service: ENT;  Laterality: Right;   TOTAL KNEE ARTHROPLASTY  08/23/2012   right   TOTAL KNEE ARTHROPLASTY  08/23/2012   Procedure: TOTAL KNEE ARTHROPLASTY;  Surgeon: Lorn Junes, MD;  Location: [facility];  Service: Orthopedics;  Laterality: Right;  RIGHT KNEE ARTHROPLASTY MEDIAL AND LATERAL COMPARTMENTS WITH PATELLA RESURFACING   TUBAL LIGATION  2001   UPPER GASTROINTESTINAL ENDOSCOPY       Family History: Family History  Problem Relation Age of Onset   Diabetes Mother    Lung cancer Mother        was a smoker   Stroke Mother    Alcohol abuse Father    Lung cancer Father        was a smoker   Emphysema Father        was a smoker   Heart disease Sister    Other Sister        blood clotting disorder   Diabetes Brother    Seizures Brother    Diabetes Maternal Aunt    Breast cancer Maternal Aunt    Esophageal cancer Maternal Uncle    Diabetes Maternal Uncle    Heart disease Maternal Grandfather    Diabetes Maternal Grandfather    Bipolar disorder Son    Muscular dystrophy Son    Heart disease Son    Heart disease Other        uncle   Colon cancer Neg Hx    Pancreatic cancer Neg Hx    Stomach cancer Neg Hx    Colon polyps Neg Hx    Rectal cancer Neg Hx      Social History: Paislynn lives at home with her husband.  They live in a house that has hardwood throughout the home.  They have electric heating and central cooling.  There are 3 dogs  and 1 cat in the home.  They have always had dogs and cats in the home.   Review of Systems  Constitutional: Negative.  Negative for fever, malaise/fatigue and weight loss.  HENT: Negative.  Negative for  congestion, ear discharge and ear pain.   Eyes:  Negative for pain, discharge and redness.  Respiratory:  Negative for cough, sputum production, shortness of breath and wheezing.   Cardiovascular: Negative.  Negative for chest pain and palpitations.  Gastrointestinal:  Negative for abdominal pain and heartburn.  Skin: Negative.  Negative for itching and rash.  Neurological:  Negative for dizziness and headaches.  Endo/Heme/Allergies:  Negative for environmental allergies. Does not bruise/bleed easily.      Objective:   Blood pressure 120/66, pulse 93, temperature 97.7 F (36.5 C), temperature source Temporal, resp. rate 18, height 5\' 1"  (1.549 m), weight 173 lb (78.5 kg), SpO2 96 %. Body mass index is 32.69 kg/m.    Physical Exam Vitals reviewed.  Constitutional:      Appearance: She is well-developed.     Comments: In wheelchair.   HENT:     Head: Normocephalic and atraumatic.     Right Ear: Tympanic membrane, ear canal and external ear normal. No drainage, swelling or tenderness. Tympanic membrane is not injected, scarred, erythematous, retracted or bulging.     Left Ear: Tympanic membrane, ear canal and external ear normal. No drainage, swelling or tenderness. Tympanic membrane is not injected, scarred, erythematous, retracted or bulging.     Nose: Rhinorrhea present. No nasal deformity, septal deviation or mucosal edema.     Right Turbinates: Enlarged and swollen.     Left Turbinates: Enlarged and swollen.     Right Sinus: No maxillary sinus tenderness or frontal sinus tenderness.     Left Sinus: No maxillary sinus tenderness or frontal sinus tenderness.     Mouth/Throat:     Mouth: Mucous membranes are not pale and not dry.     Pharynx: Uvula midline.  Eyes:     General:        Right eye: No discharge.        Left eye: No discharge.     Conjunctiva/sclera: Conjunctivae normal.     Right eye: Right conjunctiva is not injected. No chemosis.    Left eye: Left conjunctiva  is not injected. No chemosis.    Pupils: Pupils are equal, round, and reactive to light.  Cardiovascular:     Rate and Rhythm: Normal rate and regular rhythm.     Heart sounds: Normal heart sounds.  Pulmonary:     Effort: Pulmonary effort is normal. No tachypnea, accessory muscle usage or respiratory distress.     Breath sounds: Normal breath sounds. No wheezing, rhonchi or rales.     Comments: Moving air well in all lung fields. Some coarse upper airway sounds present.  Chest:     Chest wall: No tenderness.  Abdominal:     Tenderness: There is no abdominal tenderness. There is no guarding or rebound.  Lymphadenopathy:     Head:     Right side of head: No submandibular, tonsillar or occipital adenopathy.     Left side of head: No submandibular, tonsillar or occipital adenopathy.     Cervical: No cervical adenopathy.  Skin:    Coloration: Skin is not pale.     Findings: No abrasion, erythema, petechiae or rash. Rash is not papular, urticarial or vesicular.  Neurological:     Mental Status: She is alert.  Psychiatric:  Behavior: Behavior is cooperative.     Diagnostic studies: labs sent instead        Salvatore Marvel, MD [facility]

## 2021-09-23 NOTE — Patient Instructions (Addendum)
1. Allergic reaction - We are going to do penicillin testing and then a challenge if this is negative. - We are going to try to address each of the other antibiotic allergies in each visit.   2. Chronic rhinitis - We will get blood work to look for environmental allergies since we are getting blood anyway. - Continue with Flonase and Zyrtec for now.  3. Return in about 6 weeks (around 11/04/2021) for PENICILLIN TESTING.    Please inform us of any Emergency Department visits, hospitalizations, or changes in symptoms. Call us before going to the ED for breathing or allergy symptoms since we might be able to fit you in for a sick visit. Feel free to contact us anytime with any questions, problems, or concerns.  It was a pleasure to meet you and your family today!  Websites that have reliable patient information: 1. American Academy of Asthma, Allergy, and Immunology: www.aaaai.org 2. Food Allergy Research and Education (FARE): foodallergy.org 3. Mothers of Asthmatics: http://www.asthmacommunitynetwork.org 4. American College of Allergy, Asthma, and Immunology: www.acaai.org   COVID-19 Vaccine Information can be found at: ShippingScam.co.uk For questions related to vaccine distribution or appointments, please email vaccine@[email or web address] .[email or web address] or call [phone].   We realize that you might be concerned about having an allergic reaction to the COVID19 vaccines. To help with that concern, WE ARE OFFERING THE COVID19 VACCINES IN OUR OFFICE! Ask the front desk for dates!     Like [place] on [place] and Instagram for our latest updates!      A healthy democracy works best when [employer] participate! Make sure you are registered to vote! If you have moved or changed any of your contact information, you will need to get this updated before voting!  In some cases, you MAY be able to register to vote online:  [email or web address]

## 2021-09-24 ENCOUNTER — Encounter: Payer: Self-pay | Admitting: Allergy & Immunology

## 2021-09-25 ENCOUNTER — Other Ambulatory Visit: Payer: Self-pay | Admitting: Neurology

## 2021-09-25 ENCOUNTER — Telehealth: Payer: Self-pay | Admitting: Pulmonary Disease

## 2021-09-25 DIAGNOSIS — I509 Heart failure, unspecified: Secondary | ICD-10-CM | POA: Diagnosis not present

## 2021-09-25 DIAGNOSIS — J449 Chronic obstructive pulmonary disease, unspecified: Secondary | ICD-10-CM | POA: Diagnosis not present

## 2021-09-25 DIAGNOSIS — G4733 Obstructive sleep apnea (adult) (pediatric): Secondary | ICD-10-CM | POA: Diagnosis not present

## 2021-09-25 NOTE — Telephone Encounter (Signed)
Patient scheduled for PFT in [place] office. Informed patient she does not have to worry about getting Covid test done and that the office is not requiring them before PFTs anymore. Nothing further needed.

## 2021-09-27 ENCOUNTER — Encounter: Payer: Self-pay | Admitting: Acute Care

## 2021-09-27 ENCOUNTER — Telehealth: Payer: Self-pay | Admitting: Acute Care

## 2021-09-27 ENCOUNTER — Ambulatory Visit (INDEPENDENT_AMBULATORY_CARE_PROVIDER_SITE_OTHER): Payer: Medicaid Other | Admitting: Acute Care

## 2021-09-27 ENCOUNTER — Other Ambulatory Visit: Payer: Self-pay

## 2021-09-27 ENCOUNTER — Ambulatory Visit (INDEPENDENT_AMBULATORY_CARE_PROVIDER_SITE_OTHER): Payer: Medicaid Other | Admitting: Internal Medicine

## 2021-09-27 VITALS — BP 102/70 | HR 75 | Temp 98.0°F | Ht 61.0 in | Wt 189.0 lb

## 2021-09-27 DIAGNOSIS — J9611 Chronic respiratory failure with hypoxia: Secondary | ICD-10-CM | POA: Diagnosis not present

## 2021-09-27 DIAGNOSIS — J449 Chronic obstructive pulmonary disease, unspecified: Secondary | ICD-10-CM

## 2021-09-27 DIAGNOSIS — Z9989 Dependence on other enabling machines and devices: Secondary | ICD-10-CM

## 2021-09-27 DIAGNOSIS — F17210 Nicotine dependence, cigarettes, uncomplicated: Secondary | ICD-10-CM

## 2021-09-27 DIAGNOSIS — Z720 Tobacco use: Secondary | ICD-10-CM

## 2021-09-27 DIAGNOSIS — G4733 Obstructive sleep apnea (adult) (pediatric): Secondary | ICD-10-CM

## 2021-09-27 LAB — PULMONARY FUNCTION TEST
DL/VA % pred: 72 %
DL/VA: 3.12 ml/min/mmHg/L
DLCO cor % pred: 52 %
DLCO cor: 9.41 ml/min/mmHg
DLCO unc % pred: 52 %
DLCO unc: 9.41 ml/min/mmHg
FEF 25-75 Post: 0.6 L/sec
FEF 25-75 Pre: 0.55 L/sec
FEF2575-%Change-Post: 8 %
FEF2575-%Pred-Post: 29 %
FEF2575-%Pred-Pre: 26 %
FEV1-%Change-Post: 5 %
FEV1-%Pred-Post: 47 %
FEV1-%Pred-Pre: 45 %
FEV1-Post: 1.05 L
FEV1-Pre: 0.99 L
FEV1FVC-%Change-Post: 0 %
FEV1FVC-%Pred-Pre: 74 %
FEV6-%Change-Post: 1 %
FEV6-%Pred-Post: 62 %
FEV6-%Pred-Pre: 62 %
FEV6-Post: 1.73 L
FEV6-Pre: 1.71 L
FEV6FVC-%Pred-Post: 104 %
FEV6FVC-%Pred-Pre: 104 %
FVC-%Change-Post: 6 %
FVC-%Pred-Post: 63 %
FVC-%Pred-Pre: 59 %
FVC-Post: 1.82 L
FVC-Pre: 1.71 L
Post FEV1/FVC ratio: 58 %
Post FEV6/FVC ratio: 100 %
Pre FEV1/FVC ratio: 58 %
Pre FEV6/FVC Ratio: 100 %

## 2021-09-27 NOTE — Patient Instructions (Addendum)
°  It is good to see you today.  For  your COPD continue Breztri 2 puffs in the morning and 2 puffs in the evening Rinse mouth after use  Continue as needed albuterol nebs Wear oxygen at 2 L  as ordered.  Saturation goals are > 88% at all times.  We will call you with the results of the overnight sleep study.   Note your daily symptoms > remember "red flags" for COPD:  Increase in cough, increase in sputum production, increase in shortness of breath or activity intolerance. If you notice these symptoms, please call to be seen.    Please contact office for sooner follow up if symptoms do not improve or worsen or seek emergency care   Continue on CPAP at bedtime. You appear to be benefiting from the treatment  Goal is to wear for at least 6 hours each night for maximal clinical benefit. Continue to work on weight loss, as the link between excess weight  and sleep apnea is well established.   Remember to establish a good bedtime routine, and work on sleep hygiene.  Limit daytime naps , avoid stimulants such as caffeine and nicotine close to bedtime, exercise daily to promote sleep quality, avoid heavy , spicy, fried , or rich foods before bed. Ensure adequate exposure to natural light during the day,establish a relaxing bedtime routine with a pleasant sleep environment ( Bedroom between 60 and 67 degrees, turn off bright lights , TV or device screens screens , consider black out curtains or white noise machines) Do not drive if sleepy. Remember to clean mask, tubing, filter, and reservoir once weekly with soapy water.  Follow up with  Dr. Halford Chessman   In 3 months  or before as needed.

## 2021-09-27 NOTE — Telephone Encounter (Signed)
ONO with CPAP and 2 liters 09/04/21 >> test time 6 hrs 33 min.  Baseline SpO2 94%, low SpO2 87%.  Spent 12 sec with SpO2 < 88%.     Please let her know her oxygen test looked good.  She should continue using CPAP and 2 liters oxygen at night.  Triage, please call patient again with these results. Even though they were called with the results, neither the patient nor her husband have any recollection of getting the call.  Thanks so much

## 2021-09-27 NOTE — Progress Notes (Signed)
Spirometry pre/post and DLCO performed today. Patient was not able to perform Pleth or Nitrogen washout.

## 2021-09-27 NOTE — Progress Notes (Signed)
History of Present Illness [name] is a 66 y.o. female current every day smoker with Gold Stage III COPD, obstructive sleep apnea, and chronic respiratory failure   09/27/2021 Pt. Presents for follow up. She states she completed her Clindamycin and prednisone that Dr. Halford Chessman ordered for her COPD exacerbation. He also switched her from [facility] to [facility] which she feels has helped. She is using her albuterol every 4 hours. She states this is less since she started on Breztri.  She is not using her oxygen at present. She states she forgot it. She had PFT's today as noted below.  She states she is continuing to smoke about 5-7  cigarettes daily. She did have an overnight oximetry study done , these results were called to the patient 09/11/2021, with instructions to continue using CPAP and 2 liters oxygen at night. Patient states she feels her breathing is better, but her husband disagrees. She is wheezing today, but she had PFT's so she has not had her nebs , but did have the BD for the PFT test. She is wheelchair bound. She is non complaint with her oxygen today.  Test Results:     ONO with CPAP and 2 liters 09/04/21 >> test time 6 hrs 33 min.  Baseline SpO2 94%, low SpO2 87%.  Spent 12 sec with SpO2 < 88%. Echo 07/15/16 >> EF 55 to 60%, grade 1 DD  CBC Latest Ref Rng & Units 05/24/2021 04/10/2020 04/05/2019  WBC 3.4 - 10.8 x10E3/uL 8.6 9.6 9.7  Hemoglobin 11.1 - 15.9 g/dL 10.1(L) 11.6 11.8  Hematocrit 34.0 - 46.6 % 30.9(L) 35.7 34.2  Platelets 150 - 450 x10E3/uL 297 238 250    BMP Latest Ref Rng & Units 05/24/2021 04/10/2020 04/05/2019  Glucose 70 - 99 mg/dL 90 91 96  BUN 8 - 27 mg/dL 13 13 15   Creatinine 0.57 - 1.00 mg/dL 1.25(H) 1.02(H) 1.02(H)  BUN/Creat Ratio 12 - 28 10(L) 13 15  Sodium 134 - 144 mmol/L 142 139 139  Potassium 3.5 - 5.2 mmol/L 4.0 4.3 4.3  Chloride 96 - 106 mmol/L 103 105 105  CO2 20 - 29 mmol/L 24 21 21   Calcium 8.7 - 10.3 mg/dL 9.1 8.6(L) 9.2    BNP     Component Value Date/Time   BNP 14.4 01/30/2012 1700    ProBNP    Component Value Date/Time   PROBNP 509.5 (H) 08/31/2012 0500    PFT No results found for: FEV1PRE, FEV1POST, FVCPRE, FVCPOST, TLC, DLCOUNC, PREFEV1FVCRT, PSTFEV1FVCRT  ECHOCARDIOGRAM COMPLETE  Result Date: 09/03/2021    ECHOCARDIOGRAM REPORT   Patient Name:   [name]   Date of Exam: 09/03/2021 Medical Rec #:  [id number]     Height:       61.0 in Accession #:    [id number]    Weight:       171.0 lb Date of Birth:  June 25, 1956     BSA:          1.767 m Patient Age:    32 years      BP:           100/54 mmHg Patient Gender: F             HR:           86 bpm. Exam Location:  [address] Procedure: 2D Echo, Cardiac Doppler, Color Doppler and Strain Analysis Indications:    I50.33 CHF  History:        Patient has  prior history of Echocardiogram examinations, most                 recent 07/15/2016. COPD; Risk Factors:Sleep Apnea, Dyslipidemia,                 Current Smoker and Obesity.  Sonographer:    Basilia Jumbo BS, RDCS Referring Phys: [place]  1. Left ventricular ejection fraction, by estimation, is 60 to 65%. The left ventricle has normal function. The left ventricle has no regional wall motion abnormalities. Left ventricular diastolic parameters are consistent with Grade I diastolic dysfunction (impaired relaxation). The average left ventricular global longitudinal strain is -26.9 %. The global longitudinal strain is normal.  2. Right ventricular systolic function is normal. The right ventricular size is normal.  3. The mitral valve is normal in structure. No evidence of mitral valve regurgitation. No evidence of mitral stenosis.  4. The aortic valve is normal in structure. Aortic valve regurgitation is not visualized. No aortic stenosis is present.  5. The inferior vena cava is normal in size with greater than 50% respiratory variability, suggesting right atrial pressure of 3 mmHg. Comparison(s): 07/15/16  EF 55-60%. PA pressure 56mmHg. FINDINGS  Left Ventricle: Left ventricular ejection fraction, by estimation, is 60 to 65%. The left ventricle has normal function. The left ventricle has no regional wall motion abnormalities. The average left ventricular global longitudinal strain is -26.9 %. The global longitudinal strain is normal. The left ventricular internal cavity size was normal in size. There is no left ventricular hypertrophy. Left ventricular diastolic parameters are consistent with Grade I diastolic dysfunction (impaired relaxation). Right Ventricle: The right ventricular size is normal. No increase in right ventricular wall thickness. Right ventricular systolic function is normal. Left Atrium: Left atrial size was normal in size. Right Atrium: Right atrial size was normal in size. Pericardium: There is no evidence of pericardial effusion. Mitral Valve: The mitral valve is normal in structure. No evidence of mitral valve regurgitation. No evidence of mitral valve stenosis. Tricuspid Valve: The tricuspid valve is normal in structure. Tricuspid valve regurgitation is not demonstrated. No evidence of tricuspid stenosis. Aortic Valve: The aortic valve is normal in structure. Aortic valve regurgitation is not visualized. No aortic stenosis is present. Pulmonic Valve: The pulmonic valve was normal in structure. Pulmonic valve regurgitation is trivial. No evidence of pulmonic stenosis. Aorta: The aortic root is normal in size and structure. Venous: The inferior vena cava is normal in size with greater than 50% respiratory variability, suggesting right atrial pressure of 3 mmHg. IAS/Shunts: No atrial level shunt detected by color flow Doppler.  LEFT VENTRICLE PLAX 2D LVIDd:         4.90 cm   Diastology LVIDs:         3.20 cm   LV e' medial:    12.00 cm/s LV PW:         1.00 cm   LV E/e' medial:  8.9 LV IVS:        0.80 cm   LV e' lateral:   12.70 cm/s LVOT diam:     2.10 cm   LV E/e' lateral: 8.4 LV SV:         90  LV SV Index:   51        2D Longitudinal Strain LVOT Area:     3.46 cm  2D Strain GLS (A2C):   -27.0 %  2D Strain GLS (A3C):   -26.5 %                          2D Strain GLS (A4C):   -27.1 %                          2D Strain GLS Avg:     -26.9 % RIGHT VENTRICLE             IVC RV Basal diam:  3.00 cm     IVC diam: 1.70 cm RV S prime:     14.60 cm/s TAPSE (M-mode): 2.1 cm LEFT ATRIUM             Index        RIGHT ATRIUM           Index LA diam:        3.90 cm 2.21 cm/m   RA Pressure: 3.00 mmHg LA Vol (A2C):   36.9 ml 20.88 ml/m  RA Area:     10.50 cm LA Vol (A4C):   26.4 ml 14.94 ml/m  RA Volume:   20.40 ml  11.54 ml/m LA Biplane Vol: 31.5 ml 17.83 ml/m  AORTIC VALVE LVOT Vmax:   139.00 cm/s LVOT Vmean:  93.200 cm/s LVOT VTI:    0.261 m  AORTA Ao Root diam: 2.90 cm Ao Asc diam:  3.20 cm MITRAL VALVE                TRICUSPID VALVE                             Estimated RAP:  3.00 mmHg MV Decel Time: 204 msec MV E velocity: 107.00 cm/s  SHUNTS MV A velocity: 111.00 cm/s  Systemic VTI:  0.26 m MV E/A ratio:  0.96         Systemic Diam: 2.10 cm Candee Furbish MD Electronically signed by Candee Furbish MD Signature Date/Time: 09/03/2021/1:37:03 PM    Final      Past medical hx Past Medical History:  Diagnosis Date   Allergic rhinitis    Arthritis    "hands and legs and feet" (04/27/2012)   Asthma    Cancer ([place]) 2005   rt arm mole rem cancer   Chronic bronchitis (HCC)    "keep it all the time" (04/27/2012)   COPD (chronic obstructive pulmonary disease) (HCC)    COVID-19 virus infection    03-2021   Depression    PT DENIES   Diverticulosis    DJD (degenerative joint disease)    Emphysema    Epilepsy (HCC)    Esophageal stricture    Exertional dyspnea    GERD (gastroesophageal reflux disease)    Glaucoma    H/O blood clots    Right leg   History of COVID-19 03/2021   Hyperlipidemia    Hypertension    IBS (irritable bowel syndrome)    Migraine    Neck pain    Cervical  disc   OSA on CPAP    6-7 yrs; pt wears CPAP but has not been able to use it lately due to sinus issues   Oxygen deficiency    USES 3 LITERS 02 CONTINOUS   Pneumonia 2012; 04/27/2012   Recurrent upper respiratory infection (URI)    Right leg DVT ([place]) 1982   groin   Seizures ([place])  LAST SEIZURE 6 MONTHS OR  LONGER   Sleep apnea    Type II diabetes mellitus (HCC)    No meds since weight loss   UTI (urinary tract infection)    2 weeks ago   Vertigo 10/24/2014     Social History   Tobacco Use   Smoking status: Every Day    Packs/day: 1.00    Years: 40.00    Pack years: 40.00    Types: Cigarettes    Start date: 08/12/1975    Last attempt to quit: 12/30/2020    Years since quitting: 0.7   Smokeless tobacco: Never   Tobacco comments:    1-2 CIGS A DAY NOW   Vaping Use   Vaping Use: Never used  Substance Use Topics   Alcohol use: No    Alcohol/week: 0.0 standard drinks   Drug use: No    Ms.Thuman reports that she has been smoking cigarettes. She started smoking about 46 years ago. She has a 40.00 pack-year smoking history. She has never used smokeless tobacco. She reports that she does not drink alcohol and does not use drugs.  Tobacco Cessation: 4-5 cigarettes daily 40++ pack year smoking history I have spent 3 minutes counseling patient on smoking cessation this visit. We have reviewed the risks of continued smoking on pt  current health situation. Patient verbalizes understanding of their  choice to continue smoking and the negative health consequences including worsening of COPD, risk of lung cancer , stroke and heart disease.    Past surgical hx, Family hx, Social hx all reviewed.  Current Outpatient Medications on File Prior to Visit  Medication Sig   albuterol (PROAIR HFA) 108 (90 Base) MCG/ACT inhaler INHALE TWO PUFFS INTO THE LUNGS EVERY 6 HOURS AS NEEDED FOR SHORTNESS OF BREATH   albuterol (PROVENTIL) (2.5 MG/3ML) 0.083% nebulizer solution USE 1 VIAL IN NEBULIZER  EVERY 6 HOURS AS NEEDED FOR SHORTNESS OF BREATH   ALLERGY RELIEF 10 MG tablet TAKE ONE TABLET BY MOUTH DAILY   aspirin EC 81 MG tablet Take 81 mg by mouth daily. Swallow whole.   Budeson-Glycopyrrol-Formoterol (BREZTRI AEROSPHERE) 160-9-4.8 MCG/ACT AERO Inhale 2 puffs into the lungs in the morning and at bedtime.   Cholecalciferol (VITAMIN D3) 50 MCG (2000 UT) capsule TAKE ONE CAPSULE BY MOUTH DAILY   ferrous sulfate 324 (65 Fe) MG TBEC TAKE ONE TABLET BY MOUTH EVERY OTHER DAY   fluticasone (FLONASE) 50 MCG/ACT nasal spray Place 1 spray into both nostrils daily.   gabapentin (NEURONTIN) 100 MG capsule Take 1 capsule (100 mg total) by mouth at bedtime. (Patient taking differently: Take 100 mg by mouth 3 (three) times daily.)   ibuprofen (ADVIL) 800 MG tablet Take 800 mg by mouth every 8 (eight) hours as needed.   lamoTRIgine (LAMICTAL) 200 MG tablet Take 1.5 tablets (300 mg total) by mouth 2 (two) times daily.   nystatin (MYCOSTATIN) 100000 UNIT/ML suspension Take 5 mLs (500,000 Units total) by mouth 4 (four) times daily.   OXYGEN Inhale 3 L into the lungs continuous.   pantoprazole (PROTONIX) 40 MG tablet Take 1 tablet (40 mg total) by mouth 2 (two) times daily.   polyethylene glycol powder (GLYCOLAX/MIRALAX) powder MIX 17 GMS IN LIQUID AND DRINK ONCE DAILY   QUEtiapine (SEROQUEL) 100 MG tablet Take 1 tablet (100 mg total) by mouth at bedtime.   Sennosides (EX-LAX PO) Take by mouth.   simvastatin (ZOCOR) 40 MG tablet TAKE ONE TABLET BY MOUTH AT BEDTIME   topiramate (TOPAMAX)  100 MG tablet Take 200 mg twice daily   No current facility-administered medications on file prior to visit.     Allergies  Allergen Reactions   Amoxicillin Anaphylaxis    Breakout, mouth swells   Azithromycin Swelling and Rash    "swelling all over, hives, thrush"   Cefdinir     Throat swelling after 3rd dose   Chantix [Varenicline] Other (See Comments)    "bad dreams, difficulty breathing"   Doxycycline Hyclate  Anaphylaxis and Rash    swelling   Levofloxacin Anaphylaxis   Penicillins Anaphylaxis, Shortness Of Breath and Rash    "ALMOST DIED, ended up in hospital"  PCN reaction causing immediate rash, facial/tongue/throat swelling, SOB or lightheadedness with hypotension: YES PCN reaction causing severe rash involving mucus membranes or skin necrosis: NO PCN reaction that required hospitalization YES Has patient had a PCN reaction occurring within the last 10 years: NO    Sulfonamide Derivatives Shortness Of Breath, Swelling and Rash    "break out from head to toe"   Toradol [Ketorolac Tromethamine] Other (See Comments)    Seizure    Clarithromycin Hives    swelling   Nortriptyline Swelling, Rash and Other (See Comments)    Per patient had "kidney problems" on this medication, could not use bathroom (urinary retention)   Paxlovid [Nirmatrelvir-Ritonavir] Swelling    Throat swelling   Codeine Nausea And Vomiting   Fluticasone-Salmeterol Other (See Comments)    REACTION: ulcers in mouth   Triptans Rash    Mouth breaks out and start itching    Review Of Systems:  Constitutional:   No  weight loss, night sweats,  Fevers, chills, + fatigue, or  lassitude.  HEENT:   No headaches,  Difficulty swallowing,  Tooth/dental problems, or  Sore throat,                No sneezing, itching, ear ache, nasal congestion, post nasal drip,   CV:  No chest pain,  Orthopnea, PND, swelling in lower extremities, anasarca, dizziness, palpitations, syncope.   GI  No heartburn, indigestion, abdominal pain, nausea, vomiting, diarrhea, change in bowel habits, loss of appetite, bloody stools.   Resp: + shortness of breath with exertion or at rest.  No excess mucus, no productive cough,  No non-productive cough,  No coughing up of blood.  No change in color of mucus.  + wheezing.  No chest wall deformity  Skin: no rash or lesions.  GU: no dysuria, change in color of urine, no urgency or frequency.  No flank pain,  no hematuria   MS:  No joint pain or swelling.  No decreased range of motion.  No back pain.  Psych:  No change in mood or affect. No depression or anxiety.  No memory loss.   Vital Signs BP 102/70 (BP Location: Right Arm, Cuff Size: Large)    Pulse 75    Temp 98 F (36.7 C) (Oral)    Ht 5\' 1"  (1.549 m)    Wt 189 lb (85.7 kg)    SpO2 95%    BMI 35.71 kg/m    Physical Exam:  General- No distress,  A&Ox3, pleasant ENT: No sinus tenderness, TM clear, pale nasal mucosa, no oral exudate,no post nasal drip, no LAN Cardiac: S1, S2, regular rate and rhythm, no murmur Chest: + wheeze/ rales/ dullness; no accessory muscle use, no nasal flaring, no sternal retractions Abd.: Soft Non-tender,ND, BS +, Body mass index is 35.71 kg/m. Ext: No clubbing cyanosis, edema Neuro:  Physically deconditioned, wheelchair bound, MAE x 4, A&O x 3 Skin: No rashes, warm and dry, no lesionbs Psych: normal mood and behavior   Assessment/Plan Resolved COPD exacerbation  Plan For  your COPD continue Breztri 2 puffs in the morning and 2 puffs in the evening Rinse mouth after use  Continue as needed albuterol nebs Wear oxygen at 2 L [place] as ordered.  Saturation goals are > 88% at all times.  We will call you with the results of the overnight sleep study.   Note your daily symptoms > remember "red flags" for COPD:  Increase in cough, increase in sputum production, increase in shortness of breath or activity intolerance. If you notice these symptoms, please call to be seen.    Please contact office for sooner follow up if symptoms do not improve or worsen or seek emergency care    OSA on CPAP with 2 L [place] oxygen bleed Plan Continue on CPAP at bedtime. You appear to be benefiting from the treatment  Goal is to wear for at least 6 hours each night for maximal clinical benefit. Continue to work on weight loss, as the link between excess weight  and sleep apnea is well established.   Remember to establish a good  bedtime routine, and work on sleep hygiene.  Limit daytime naps , avoid stimulants such as caffeine and nicotine close to bedtime, exercise daily to promote sleep quality, avoid heavy , spicy, fried , or rich foods before bed. Ensure adequate exposure to natural light during the day,establish a relaxing bedtime routine with a pleasant sleep environment ( Bedroom between 60 and 67 degrees, turn off bright lights , TV or device screens screens , consider black out curtains or white noise machines) Do not drive if sleepy. Remember to clean mask, tubing, filter, and reservoir once weekly with soapy water.  Follow up with  Dr. Halford Chessman   In 3 months  or before as needed.    Current Every Day smoker Plan I have spent 3 minutes counseling patient on smoking cessation this visit. We have reviewed the risks of continued smoking on his current health situation. Patient verbalizes understanding of their  choice to continue smoking and the negative health consequences including worsening of COPD, risk of lung cancer , stroke and heart disease..     I spent 40 minutes dedicated to the care of this patient on the date of this encounter to include pre-visit review of records, face-to-face time with the patient discussing conditions above, post visit ordering of testing, clinical documentation with the electronic health record, making appropriate referrals as documented, and communicating necessary information to the patient's healthcare team.       Magdalen Spatz, NP 09/27/2021  1:40 PM

## 2021-09-27 NOTE — Patient Instructions (Signed)
Spirometry pre/post and DLCO performed today. 

## 2021-09-30 ENCOUNTER — Telehealth: Payer: Self-pay | Admitting: Acute Care

## 2021-09-30 NOTE — Telephone Encounter (Signed)
Called patient and left voicemail for patient to call office back.

## 2021-09-30 NOTE — Telephone Encounter (Signed)
Spoke with pt and reviewed pt ONO results as dictated by Judson Roch. Pt also requested f/u Dr. Halford Chessman be moved from [place] office to [place] office. F/U appointment rescheduled for 01/13/22 in [place]. Nothing further needed at this time.

## 2021-10-01 ENCOUNTER — Telehealth: Payer: Self-pay | Admitting: Pulmonary Disease

## 2021-10-01 NOTE — Telephone Encounter (Signed)
ONO with 2 liters and CPAP 09/04/21 >> test time 6 hrs 33 min.  Baseline SpO2 94%, low SpO2 87%.  Spent 12 sec with SpO2 < 88%.  Please let her know her overnight oximetry shows good control of oxygen level with CPAP and 2 liters oxygen at night

## 2021-10-01 NOTE — Telephone Encounter (Signed)
Called and spoke with patient. She stated that she was able to speak with someone yesterday about her ONO results. She verbalized understanding and did not have any questions.  Will close this encounter.

## 2021-10-02 DIAGNOSIS — I509 Heart failure, unspecified: Secondary | ICD-10-CM | POA: Diagnosis not present

## 2021-10-02 DIAGNOSIS — R269 Unspecified abnormalities of gait and mobility: Secondary | ICD-10-CM | POA: Diagnosis not present

## 2021-10-02 DIAGNOSIS — J449 Chronic obstructive pulmonary disease, unspecified: Secondary | ICD-10-CM | POA: Diagnosis not present

## 2021-10-02 NOTE — Telephone Encounter (Signed)
ATC patient to relay results. No answer. VM is full

## 2021-10-02 NOTE — Telephone Encounter (Signed)
Patient is returning phone call. Patient phone number is [phone].

## 2021-10-02 NOTE — Telephone Encounter (Signed)
ATC patient back. No answer but was able to leave a detailed message on cell phone vm (dpr) with results. Advised patient to call back for any questions.

## 2021-10-03 DIAGNOSIS — Z1231 Encounter for screening mammogram for malignant neoplasm of breast: Secondary | ICD-10-CM | POA: Diagnosis not present

## 2021-10-08 ENCOUNTER — Other Ambulatory Visit: Payer: Self-pay

## 2021-10-08 ENCOUNTER — Other Ambulatory Visit: Payer: Self-pay | Admitting: *Deleted

## 2021-10-08 DIAGNOSIS — J449 Chronic obstructive pulmonary disease, unspecified: Secondary | ICD-10-CM | POA: Diagnosis not present

## 2021-10-08 DIAGNOSIS — J441 Chronic obstructive pulmonary disease with (acute) exacerbation: Secondary | ICD-10-CM | POA: Diagnosis not present

## 2021-10-08 DIAGNOSIS — R0902 Hypoxemia: Secondary | ICD-10-CM | POA: Diagnosis not present

## 2021-10-08 NOTE — Patient Instructions (Signed)
Visit Information  Ms. Fagin was given information about Medicaid Managed Care team care coordination services as a part of their [place] Medicaid benefit. [place] verbally consented to engagement with the [facility] Managed Care team.   If you are experiencing a medical emergency, please call 911 or report to your local emergency department or urgent care.   If you have a non-emergency medical problem during routine business hours, please contact your provider's office and ask to speak with a nurse.   For questions related to your [facility], please call: [phone] or visit the homepage here: [email or web address]  If you would like to schedule transportation through your [facility], please call the following number at least 2 days in advance of your appointment: [phone].  Rides for urgent appointments can also be made after hours by calling Member Services.  Call the [facility] at [phone], at any time, 24 hours a day, 7 days a week. If you are in danger or need immediate medical attention call 911.  If you would like help to quit smoking, call 1-800-QUIT-NOW 409-304-7733) OR Espaol: 1-855-Djelo-Ya (6-237-628-3151) o para ms informacin haga clic aqu or Text READY to 200-400 to register via text  Ms. Nylander,   Please see education materials related to using inhalers provided by MyChart link.  Patient verbalizes understanding of instructions and care plan provided today and agrees to view in [place]. Active MyChart status confirmed with patient.    Telephone follow up appointment with Managed Medicaid care management team member scheduled for:11/07/21 @ 2:30pm  Lurena Joiner RN, BSN [place] RN Care Coordinator   Following is a copy of your plan of care:  Care Plan : RN Care Manager Plan of Care   Updates made by [name], RN since 10/08/2021 12:00 AM     Problem: Chronic health management needs related to CHF and COPD      Long-Range Goal: Develop of Plan of Care to address health management needs related to CHF and COPD   Start Date: 07/11/2021  Expected End Date: 11/08/2021  Priority: High  Note:   Current Barriers:  Chronic Disease Management support and education needs related to CHF and COPD-Ms. Llera is having drug and food challenge with Allergy and Asthma. She would like an appointment with Orthopedic for knee pain which has caused her to fall recently.  She continues to work on smoking cessation and is down to 5-6 cig/day.  RNCM Clinical Goal(s):  Patient will verbalize understanding of plan for management of CHF and COPD as evidenced by patient verbalization and self monitoring activities take all medications exactly as prescribed and will call provider for medication related questions as evidenced by documentation in EMR    attend all scheduled medical appointments: 3/17 with Allergy and Asthma and 3/22 with Neurology as evidenced by documentation in EMR        demonstrate improved adherence to prescribed treatment plan for CHF and COPD as evidenced by physician notes documented in EMR  through collaboration with RN Care manager, provider, and care team.   Interventions: Inter-disciplinary care team collaboration (see longitudinal plan of care) Evaluation of current treatment plan related to  self management and patient's adherence to plan as established by provider Contact Orthopedic office for appointment or Dr. Domenic Moras for referral if needed Provided with education on preventing falls   Heart Failure Interventions:  (Status: Condition stable. Not  addressed this visit.)  Long Term Goal  Reviewed Heart Failure Action Plan in depth and provided written copy Discussed importance of daily weight and advised patient to weigh and record daily Assessed social determinant  of health barriers Advised patient to discuss any new concerns or symptoms  COPD: (Status: Goal on Track (progressing): YES.) Long Term Goal  Reviewed medications with patient, including use of prescribed maintenance and rescue inhalers, and provided instruction on medication management and the importance of adherence Advised patient to track and manage COPD triggers Provided instruction about proper use of medications used for management of COPD including inhalers Provided education about and advised patient to utilize infection prevention strategies to reduce risk of respiratory infection Discussed the importance of adequate rest and management of fatigue with COPD Assessed social determinant of health barriers   Patient Goals/Self-Care Activities: Take medications as prescribed   Attend all scheduled provider appointments Call pharmacy for medication refills 3-7 days in advance of running out of medications Call provider office for new concerns or questions  call office if I gain more than 2 pounds in one day or 5 pounds in one week use salt in moderation watch for swelling in feet, ankles and legs every day eat more whole grains, fruits and vegetables, lean meats and healthy fats track symptoms and what helps feel better or worse dress right for the weather, hot or cold - eliminate symptom triggers at home - eat healthy/prescribed diet: heart healthy - get at least 7 to 8 hours of sleep at night - use devices that will help like a cane, sock-puller or reacher - do breathing exercises every day

## 2021-10-08 NOTE — Patient Outreach (Signed)
Medicaid Managed Care   Nurse Care Manager Note  10/08/2021 Name:  [name] MRN:  [id number] DOB:  Mar 06, 1956  [name] is an 66 y.o. year old female who is a primary patient of [place], [place] Dragon, FNP.  The [facility] Managed Care Coordination team was consulted for assistance with:    COPD  Ms. Olesen was given information about [employer] team services today. [place] Patient agreed to services and verbal consent obtained.  Engaged with patient by telephone for follow up visit in response to provider referral for case management and/or care coordination services.   Assessments/Interventions:  Review of past medical history, allergies, medications, health status, including review of consultants reports, laboratory and other test data, was performed as part of comprehensive evaluation and provision of chronic care management services.  SDOH (Social Determinants of Health) assessments and interventions performed:   Care Plan  Allergies  Allergen Reactions   Amoxicillin Anaphylaxis    Breakout, mouth swells   Azithromycin Swelling and Rash    "swelling all over, hives, thrush"   Cefdinir     Throat swelling after 3rd dose   Chantix [Varenicline] Other (See Comments)    "bad dreams, difficulty breathing"   Doxycycline Hyclate Anaphylaxis and Rash    swelling   Levofloxacin Anaphylaxis   Penicillins Anaphylaxis, Shortness Of Breath and Rash    "ALMOST DIED, ended up in hospital"  PCN reaction causing immediate rash, facial/tongue/throat swelling, SOB or lightheadedness with hypotension: YES PCN reaction causing severe rash involving mucus membranes or skin necrosis: NO PCN reaction that required hospitalization YES Has patient had a PCN reaction occurring within the last 10 years: NO    Sulfonamide Derivatives Shortness Of Breath, Swelling and Rash    "break out from head to toe"   Toradol [Ketorolac Tromethamine] Other (See Comments)    Seizure     Clarithromycin Hives    swelling   Nortriptyline Swelling, Rash and Other (See Comments)    Per patient had "kidney problems" on this medication, could not use bathroom (urinary retention)   Paxlovid [Nirmatrelvir-Ritonavir] Swelling    Throat swelling   Codeine Nausea And Vomiting   Fluticasone-Salmeterol Other (See Comments)    REACTION: ulcers in mouth   Triptans Rash    Mouth breaks out and start itching    Medications Reviewed Today     Reviewed by [name], RN (Registered Nurse) on 10/08/21 at [place] List Status: <None>   Medication Order Taking? Sig Documenting Provider Last Dose Status Informant  albuterol (PROAIR HFA) 108 (90 Base) MCG/ACT inhaler [id number] Yes INHALE TWO PUFFS INTO THE LUNGS EVERY 6 HOURS AS NEEDED FOR SHORTNESS OF BREATH [name], [name], MD Taking Active Self  albuterol (PROVENTIL) (2.5 MG/3ML) 0.083% nebulizer solution [id number] Yes USE 1 VIAL IN NEBULIZER EVERY 6 HOURS AS NEEDED FOR SHORTNESS OF BREATH [name], [name], MD Taking Active Self  ALLERGY RELIEF 10 MG tablet [id number] Yes TAKE ONE TABLET BY MOUTH DAILY [name], [name], MD Taking Active   aspirin EC 81 MG tablet [id number] Yes Take 81 mg by mouth daily. Swallow whole. [provider] Taking Active Self  Budeson-Glycopyrrol-Formoterol (BREZTRI AEROSPHERE) 160-9-4.8 MCG/ACT AERO [id number] Yes Inhale 2 puffs into the lungs in the morning and at bedtime. Chesley Mires, MD Taking Active   Cholecalciferol (VITAMIN D3) 50 MCG (2000 UT) capsule [id number] Yes TAKE ONE CAPSULE BY MOUTH DAILY [name], [name], MD Taking Active Self  ferrous sulfate 324 (65 Fe) MG  TBEC [id number] Yes TAKE ONE TABLET BY MOUTH EVERY OTHER DAY [name], [name], MD Taking Active   fluticasone (FLONASE) 50 MCG/ACT nasal spray [id number] Yes Place 1 spray into both nostrils daily. [provider] Taking Active Self  gabapentin (NEURONTIN) 100 MG capsule [id number]  Yes Take 1 capsule (100 mg total) by mouth at bedtime.  Patient taking differently: Take 100 mg by mouth 3 (three) times daily.   [name], [name], MD Taking Active Self           Med Note (Jung Ingerson A   Thu Jul 11, 2021  3:41 PM) Taking three times a day  ibuprofen (ADVIL) 800 MG tablet [id number] No Take 800 mg by mouth every 8 (eight) hours as needed.  Patient not taking: Reported on 10/08/2021   [provider] Not Taking Active   lamoTRIgine (LAMICTAL) 200 MG tablet [id number] Yes Take 1.5 tablets (300 mg total) by mouth 2 (two) times daily. [name], NP Taking Active   nystatin (MYCOSTATIN) 100000 UNIT/ML suspension [id number] No Take 5 mLs (500,000 Units total) by mouth 4 (four) times daily.  Patient not taking: Reported on 10/08/2021   Lestine Box, [place] Not Taking Active   OXYGEN [id number] Yes Inhale 3 L into the lungs continuous. [provider] Taking Active Self           Med Note (Morrison Masser A   Thu Sep 12, 2021  2:12 PM) Using 2L continuous  pantoprazole (PROTONIX) 40 MG tablet [id number] Yes Take 1 tablet (40 mg total) by mouth 2 (two) times daily. [name], [place] Taking Active Self  polyethylene glycol powder [facility]) powder [id number] Yes MIX 17 GMS IN LIQUID AND DRINK ONCE DAILY [name], MD Taking Active   QUEtiapine (SEROQUEL) 100 MG tablet [id number] Yes Take 1 tablet (100 mg total) by mouth at bedtime. [name], NP Taking Active   Sennosides (EX-LAX PO) [id number] No Take by mouth.  Patient not taking: Reported on 10/08/2021   [provider] Not Taking Active   simvastatin (ZOCOR) 40 MG tablet [id number] Yes TAKE ONE TABLET BY MOUTH AT BEDTIME [name], [name], MD Taking Active Self  topiramate (TOPAMAX) 100 MG tablet [id number] Yes Take 200 mg twice daily [name], NP Taking Active             Patient Active Problem List   Diagnosis Date Noted   Candida infection,  esophageal ([place])    Ventricular diastolic dysfunction determined by echocardiography 04/12/2021   Multiple allergies 04/12/2021   Pressure injury of ankle, stage 2 ([place]) 01/31/2021   Dehydration 01/18/2021   Aspiration pneumonia ([place]) 12/30/2020   Dysphagia 12/20/2020   Radiculopathy affecting upper extremity 11/12/2020   Encounter for wheelchair assessment 05/16/2020   Shortness of breath 07/26/2019   Chronic pain of left knee 12/06/2018   Tobacco dependence 07/02/2018   Elevated serum creatinine 06/01/2018   Generalized weakness 05/15/2017   Chronic bronchitis ([place]) 01/14/2017   Claudication ([place]) 04/09/2016   Deviated septum 02/06/2016    Class: Chronic   Nasal turbinate hypertrophy 02/06/2016   Seizure disorder ([place]) 11/12/2015   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 05/23/2015   Rhinitis, chronic 04/25/2015   Dizziness 10/24/2014   Pulmonary hypertension ([place]) 27/51/7001   Diastolic CHF ([place]) 74/94/4967   BPPV (benign paroxysmal positional vertigo) 03/09/2013   Chronic respiratory failure ([place]) 11/01/2012   Left shoulder pain 06/27/2012   Headache 05/09/2012   Anxiety state 02/13/2010   Obstructive  sleep apnea 02/19/2009   Vitamin D deficiency 01/21/2008   OSTEOPENIA 11/24/2007   Irritable bowel syndrome 11/17/2007   COPD (chronic obstructive pulmonary disease) ([place]) 09/24/2007   Hyperlipidemia 10/08/2006   OBESITY, NOS  BMI 33.8 10/08/2006   GLAUCOMA 10/08/2006   GASTROESOPHAGEAL REFLUX, NO ESOPHAGITIS 10/08/2006   HERNIA, HIATAL, NONCONGENITAL 10/08/2006   INCONTINENCE, STRESS, FEMALE 10/08/2006   DJD (degenerative joint disease) of knee 10/08/2006    Conditions to be addressed/monitored per PCP order:  COPD  Care Plan : RN Care Manager Plan of Care  Updates made by [name], RN since 10/08/2021 12:00 AM     Problem: Chronic health management needs related to CHF and COPD      Long-Range Goal: Develop of Plan of Care to  address health management needs related to CHF and COPD   Start Date: 07/11/2021  Expected End Date: 11/08/2021  Priority: High  Note:   Current Barriers:  Chronic Disease Management support and education needs related to CHF and COPD-Ms. Shells is having drug and food challenge with Allergy and Asthma. She would like an appointment with Orthopedic for knee pain which has caused her to fall recently.  She continues to work on smoking cessation and is down to 5-6 cig/day.  RNCM Clinical Goal(s):  Patient will verbalize understanding of plan for management of CHF and COPD as evidenced by patient verbalization and self monitoring activities take all medications exactly as prescribed and will call provider for medication related questions as evidenced by documentation in EMR    attend all scheduled medical appointments: 3/17 with Allergy and Asthma and 3/22 with Neurology as evidenced by documentation in EMR        demonstrate improved adherence to prescribed treatment plan for CHF and COPD as evidenced by physician notes documented in EMR  through collaboration with RN Care manager, provider, and care team.   Interventions: Inter-disciplinary care team collaboration (see longitudinal plan of care) Evaluation of current treatment plan related to  self management and patient's adherence to plan as established by provider Contact Orthopedic office for appointment or Dr. Domenic Moras for referral if needed Provided with education on preventing falls   Heart Failure Interventions:  (Status: Condition stable. Not addressed this visit.)  Long Term Goal  Reviewed Heart Failure Action Plan in depth and provided written copy Discussed importance of daily weight and advised patient to weigh and record daily Assessed social determinant of health barriers Advised patient to discuss any new concerns or symptoms  COPD: (Status: Goal on Track (progressing): YES.) Long Term Goal  Reviewed medications with patient,  including use of prescribed maintenance and rescue inhalers, and provided instruction on medication management and the importance of adherence Advised patient to track and manage COPD triggers Provided instruction about proper use of medications used for management of COPD including inhalers Provided education about and advised patient to utilize infection prevention strategies to reduce risk of respiratory infection Discussed the importance of adequate rest and management of fatigue with COPD Assessed social determinant of health barriers   Patient Goals/Self-Care Activities: Take medications as prescribed   Attend all scheduled provider appointments Call pharmacy for medication refills 3-7 days in advance of running out of medications Call provider office for new concerns or questions  call office if I gain more than 2 pounds in one day or 5 pounds in one week use salt in moderation watch for swelling in feet, ankles and legs every day eat more whole grains, fruits and vegetables,  lean meats and healthy fats track symptoms and what helps feel better or worse dress right for the weather, hot or cold - eliminate symptom triggers at home - eat healthy/prescribed diet: heart healthy - get at least 7 to 8 hours of sleep at night - use devices that will help like a cane, sock-puller or reacher - do breathing exercises every day       Follow Up:  Patient agrees to Care Plan and Follow-up.  Plan: The Managed Medicaid care management team will reach out to the patient again over the next 30 days.  Date/time of next scheduled RN care management/care coordination outreach:  11/07/21 @ 2:30pm  Lurena Joiner RN, BSN [place] RN Care Coordinator

## 2021-10-10 ENCOUNTER — Telehealth: Payer: Self-pay | Admitting: Neurology

## 2021-10-10 MED ORDER — QUETIAPINE FUMARATE 100 MG PO TABS: 100.0000 mg | ORAL_TABLET | Freq: Every day | ORAL | 0 refills | Status: DC

## 2021-10-10 NOTE — Addendum Note (Signed)
Addended by: [name] on: 10/10/2021 01:59 PM ? ? Modules accepted: Orders ? ?

## 2021-10-10 NOTE — Telephone Encounter (Signed)
I will refill the Seroquel for 30 days, until she sees Dr. April Manson March 22. I believe the Seroquel was started by Dr. [name], we should probably move to have psychiatry or primary care manage her insomnia going forward.  ? ?Meds ordered this encounter  ?Medications  ? QUEtiapine (SEROQUEL) 100 MG tablet  ?  Sig: Take 1 tablet (100 mg total) by mouth at bedtime.  ?  Dispense:  30 tablet  ?  Refill:  0  ? ? ?

## 2021-10-10 NOTE — Telephone Encounter (Signed)
Pt request refill for QUEtiapine (SEROQUEL) 100 MG tablet at [place] [id number] ?

## 2021-10-10 NOTE — Telephone Encounter (Signed)
Thank you :)

## 2021-10-10 NOTE — Telephone Encounter (Signed)
Last office note from 07/24/22: ? ?Insomnia ?Has remained on Seroquel 100 mg at bedtime, I refilled for 3 months, can determine if needed for chronic use going forward ? ? ?

## 2021-10-14 ENCOUNTER — Other Ambulatory Visit: Payer: Self-pay | Admitting: Family Medicine

## 2021-10-25 ENCOUNTER — Encounter: Payer: Medicaid Other | Admitting: Allergy & Immunology

## 2021-10-25 DIAGNOSIS — J9611 Chronic respiratory failure with hypoxia: Secondary | ICD-10-CM | POA: Diagnosis not present

## 2021-10-26 ENCOUNTER — Other Ambulatory Visit: Payer: Self-pay | Admitting: Neurology

## 2021-10-26 DIAGNOSIS — J441 Chronic obstructive pulmonary disease with (acute) exacerbation: Secondary | ICD-10-CM | POA: Diagnosis not present

## 2021-10-26 DIAGNOSIS — J189 Pneumonia, unspecified organism: Secondary | ICD-10-CM | POA: Diagnosis not present

## 2021-10-26 DIAGNOSIS — R079 Chest pain, unspecified: Secondary | ICD-10-CM | POA: Diagnosis not present

## 2021-10-26 DIAGNOSIS — I959 Hypotension, unspecified: Secondary | ICD-10-CM | POA: Diagnosis not present

## 2021-10-26 DIAGNOSIS — R000 Tachycardia, unspecified: Secondary | ICD-10-CM | POA: Diagnosis not present

## 2021-10-26 DIAGNOSIS — R319 Hematuria, unspecified: Secondary | ICD-10-CM | POA: Diagnosis not present

## 2021-10-26 DIAGNOSIS — R509 Fever, unspecified: Secondary | ICD-10-CM | POA: Diagnosis not present

## 2021-10-26 DIAGNOSIS — R404 Transient alteration of awareness: Secondary | ICD-10-CM | POA: Diagnosis not present

## 2021-10-26 DIAGNOSIS — R4020 Unspecified coma: Secondary | ICD-10-CM | POA: Diagnosis not present

## 2021-10-26 DIAGNOSIS — R4182 Altered mental status, unspecified: Secondary | ICD-10-CM | POA: Diagnosis not present

## 2021-10-26 DIAGNOSIS — N390 Urinary tract infection, site not specified: Secondary | ICD-10-CM | POA: Diagnosis not present

## 2021-10-30 ENCOUNTER — Other Ambulatory Visit: Payer: Self-pay | Admitting: Family Medicine

## 2021-10-30 ENCOUNTER — Ambulatory Visit: Payer: Medicaid Other | Admitting: Neurology

## 2021-10-30 ENCOUNTER — Encounter: Payer: Self-pay | Admitting: Neurology

## 2021-10-30 ENCOUNTER — Other Ambulatory Visit: Payer: Self-pay

## 2021-10-30 VITALS — BP 124/79 | HR 82

## 2021-10-30 DIAGNOSIS — G40909 Epilepsy, unspecified, not intractable, without status epilepticus: Secondary | ICD-10-CM

## 2021-10-30 NOTE — Patient Instructions (Signed)
-  Continues with spells of staring off, blanking out, now they are daily ?-Continue with Topamax 200 mg twice a day, continue Lamictal 300 mg twice a day ?-Will obtain routine EEG ?-Will consider ambulatory EEG at home based on routine EEG results.  ?-EEG in 2016 was normal ?-Had a video monitor EEG in October 2016, did not have any seizure events during monitoring ?-Will follow-up in 3 months  ?

## 2021-10-30 NOTE — Progress Notes (Signed)
? ? ?PATIENT: [name] ?DOB: Jul 09, 1956 ? ?REASON FOR VISIT: follow up ?HISTORY FROM: patient ?Primary Neurologist: Dr. April Manson since Dr [name] retired  ? ?HISTORY OF PRESENT ILLNESS: ?Today 10/30/21. ?Patient presents today for follow-up, she is accompanied by her daughter-in-law.  She was recently admitted at [facility] for heart failure, UTI and COPD.  Daughter reported that this is her fourth hospitalization since May 2022 for COPD and UTI.  Currently she is on supplemental O2 oxygen.  Daughter in law still report episode of staring, and they are now frequent, she has them daily up to 4 times per day.  During this time she is unresponsive but no other abnormal movement noted.  At last visit, we have plan to obtain an ambulatory EEG but this was not covered by insurance, she did have some issue with transportation therefore could not go to the EMU.  She is currently on lamotrigine 200 mg twice daily and Topamax 200 mg twice daily.  Denies any side effect from the medication. ? ? ?Interval history 07/24/2021:  ?[name] here today for follow-up, hasn't been seen since Aug 2021, at that time referred to EMU with Dr. Hortense Ramal for intractable epilepsy. Remains on Lamictal 300 mg BID, Topamax 100/200 mg daily ? ?Has seizures where she will blank out (2 this month), stare off, witnessed by her grandson, lasted no more than 1 minute. Was normal beforehand. Afterwards was tired, groggy, wanted to sleep. In the morning, 2 hours after taking morning pills. Will have spell on average, every 2 months. Can't identify any triggers. ? ?She lives in [place], didn't have transportation, she cancelled the EMU admission.  ? ?Headaches are improved. Not daily anymore. Doesn't eat more than 1 meal a day. Takes Tylenol, gets 1 migraine a month that is is significant.  ? ?Recently hospitalized for UTI was septic. Still on Seroquel 100 mg at bedtime to help sleep. Is wearing 2 L oxygen daily, forgot it today. Still smoking, 5 cigarette daily.  Here today with her grandson, Sydnee Cabal. ? ?Update 04/10/2020 SS:Ms. Compere is a 66 year old female with history of intractable epilepsy and intractable headaches. She is on Lamictal and Topamax, when last seen Depakote was added. She complained of swallowing problems, barium swallow recommended caution with bread and meats, thin liquids, had mild pharyngeal dysphagia. ? ?Could not tolerate Depakote, reported stomach upset, took for 1 week.  Reports 1 seizure of loss of consciousness while lying on the bed, triggered by coughing episode, she says not always coughing related, usually once a month.  Does have episodes of staring, lasting a few seconds, several times a month. ? ?Headaches are fairly well controlled, on average 3-4 migraines a month, tolerating Aimovig 70 mg every 2 weeks well, has been quite beneficial. ? ?Has noted that her voice is raspy, continues with trouble swallowing, does have COPD, still smokes.  Recently saw PCP, did not mention this.  She does not drive, lives with family. ? ?HISTORY ?10/12/2019 Dr. [name]: [name] is a 66 year old right-handed white female with a history of intractable epilepsy and intractable headache.  The patient has been on lamotrigine and Topamax without much benefit with her seizures.  She continues to have frequent headaches, her headaches have been daily in nature.  She has not responded to Ajovy.  In the past, she has been on Botox injections and seemed to improve some with the headaches.  More recently in the last month she is having increasing problems with swallowing with both liquids and  solids.  She indicates that food will get caught in the back of the throat and she has to cough it back up again.  She is not getting food down in the lungs.  She has noted some hoarseness of the voice.  She is not losing weight, in fact she is gaining weight.  The patient does not operate a motor vehicle.  She was set up for video visit today but cannot access this, a telephone  visit therefore was done. ? ?REVIEW OF SYSTEMS: Out of a complete 14 system review of symptoms, the patient complains only of the following symptoms, and all other reviewed systems are negative. ? ?Seizure, headache ? ?ALLERGIES: ?Allergies  ?Allergen Reactions  ? Amoxicillin Anaphylaxis  ?  Breakout, mouth swells  ? Azithromycin Swelling and Rash  ?  "swelling all over, hives, thrush"  ? Cefdinir   ?  Throat swelling after 3rd dose  ? Chantix [Varenicline] Other (See Comments)  ?  "bad dreams, difficulty breathing"  ? Doxycycline Hyclate Anaphylaxis and Rash  ?  swelling  ? Levofloxacin Anaphylaxis  ? Penicillins Anaphylaxis, Shortness Of Breath and Rash  ?  "ALMOST DIED, ended up in hospital"  ?PCN reaction causing immediate rash, facial/tongue/throat swelling, SOB or lightheadedness with hypotension: YES ?PCN reaction causing severe rash involving mucus membranes or skin necrosis: NO ?PCN reaction that required hospitalization YES ?Has patient had a PCN reaction occurring within the last 10 years: NO ?  ? Sulfonamide Derivatives Shortness Of Breath, Swelling and Rash  ?  "break out from head to toe"  ? Toradol [Ketorolac Tromethamine] Other (See Comments)  ?  Seizure ?  ? Clarithromycin Hives  ?  swelling  ? Nortriptyline Swelling, Rash and Other (See Comments)  ?  Per patient had "kidney problems" on this medication, could not use bathroom (urinary retention)  ? Paxlovid [Nirmatrelvir-Ritonavir] Swelling  ?  Throat swelling  ? Codeine Nausea And Vomiting  ? Fluticasone-Salmeterol Other (See Comments)  ?  REACTION: ulcers in mouth  ? Triptans Rash  ?  Mouth breaks out and start itching  ? ? ?HOME MEDICATIONS: ?Outpatient Medications Prior to Visit  ?Medication Sig Dispense Refill  ? albuterol (PROAIR HFA) 108 (90 Base) MCG/ACT inhaler INHALE TWO PUFFS INTO THE LUNGS EVERY 6 HOURS AS NEEDED FOR SHORTNESS OF BREATH 18 g 6  ? albuterol (PROVENTIL) (2.5 MG/3ML) 0.083% nebulizer solution USE 1 VIAL IN NEBULIZER EVERY  6 HOURS AS NEEDED FOR SHORTNESS OF BREATH 75 mL 8  ? ALLERGY RELIEF 10 MG tablet TAKE ONE TABLET BY MOUTH DAILY 90 tablet 1  ? aspirin EC 81 MG tablet Take 81 mg by mouth daily. Swallow whole.    ? Budeson-Glycopyrrol-Formoterol (BREZTRI AEROSPHERE) 160-9-4.8 MCG/ACT AERO Inhale 2 puffs into the lungs in the morning and at bedtime. 10.7 g 5  ? Cholecalciferol (VITAMIN D3) 50 MCG (2000 UT) capsule TAKE ONE CAPSULE BY MOUTH DAILY 90 capsule 1  ? ferrous sulfate 324 (65 Fe) MG TBEC TAKE ONE TABLET BY MOUTH EVERY OTHER DAY 90 tablet 5  ? fluticasone (FLONASE) 50 MCG/ACT nasal spray Place 1 spray into both nostrils daily.    ? gabapentin (NEURONTIN) 100 MG capsule Take 1 capsule (100 mg total) by mouth at bedtime. (Patient taking differently: Take 100 mg by mouth 3 (three) times daily.) 90 capsule 3  ? lamoTRIgine (LAMICTAL) 200 MG tablet Take 1.5 tablets (300 mg total) by mouth 2 (two) times daily. 270 tablet 1  ? OXYGEN Inhale 3 L  into the lungs continuous.    ? pantoprazole (PROTONIX) 40 MG tablet Take 1 tablet (40 mg total) by mouth 2 (two) times daily. 60 tablet 11  ? QUEtiapine (SEROQUEL) 100 MG tablet Take 1 tablet (100 mg total) by mouth at bedtime. 30 tablet 0  ? Sennosides (EX-LAX PO) Take by mouth.    ? simvastatin (ZOCOR) 40 MG tablet TAKE ONE TABLET BY MOUTH AT BEDTIME 90 tablet 2  ? topiramate (TOPAMAX) 100 MG tablet Take 200 mg twice daily 360 tablet 3  ? ibuprofen (ADVIL) 800 MG tablet Take 800 mg by mouth every 8 (eight) hours as needed. (Patient not taking: Reported on 10/08/2021)    ? nystatin (MYCOSTATIN) 100000 UNIT/ML suspension Take 5 mLs (500,000 Units total) by mouth 4 (four) times daily. (Patient not taking: Reported on 10/08/2021) 473 mL 0  ? polyethylene glycol powder (GLYCOLAX/MIRALAX) powder MIX 17 GMS IN LIQUID AND DRINK ONCE DAILY 527 g 0  ? ?No facility-administered medications prior to visit.  ? ? ?PAST MEDICAL HISTORY: ?Past Medical History:  ?Diagnosis Date  ? Allergic rhinitis   ?  Arthritis   ? "hands and legs and feet" (04/27/2012)  ? Asthma   ? Cancer [facility]) 2005  ? rt arm mole rem cancer  ? Chronic bronchitis ([place])   ? "keep it all the time" (04/27/2012)  ? COPD (chronic obstructive pulmon

## 2021-11-05 ENCOUNTER — Other Ambulatory Visit: Payer: Self-pay | Admitting: Neurology

## 2021-11-05 DIAGNOSIS — R062 Wheezing: Secondary | ICD-10-CM | POA: Diagnosis not present

## 2021-11-05 DIAGNOSIS — Z720 Tobacco use: Secondary | ICD-10-CM | POA: Diagnosis not present

## 2021-11-05 DIAGNOSIS — R4182 Altered mental status, unspecified: Secondary | ICD-10-CM | POA: Diagnosis not present

## 2021-11-05 DIAGNOSIS — R410 Disorientation, unspecified: Secondary | ICD-10-CM | POA: Diagnosis not present

## 2021-11-05 DIAGNOSIS — I5032 Chronic diastolic (congestive) heart failure: Secondary | ICD-10-CM | POA: Diagnosis not present

## 2021-11-05 DIAGNOSIS — Z20822 Contact with and (suspected) exposure to covid-19: Secondary | ICD-10-CM | POA: Diagnosis not present

## 2021-11-05 DIAGNOSIS — N3001 Acute cystitis with hematuria: Secondary | ICD-10-CM | POA: Diagnosis not present

## 2021-11-05 DIAGNOSIS — R0902 Hypoxemia: Secondary | ICD-10-CM | POA: Diagnosis not present

## 2021-11-05 DIAGNOSIS — R531 Weakness: Secondary | ICD-10-CM | POA: Diagnosis not present

## 2021-11-05 DIAGNOSIS — R000 Tachycardia, unspecified: Secondary | ICD-10-CM | POA: Diagnosis not present

## 2021-11-05 DIAGNOSIS — J441 Chronic obstructive pulmonary disease with (acute) exacerbation: Secondary | ICD-10-CM | POA: Diagnosis not present

## 2021-11-05 NOTE — Telephone Encounter (Signed)
Rx refilled.

## 2021-11-07 ENCOUNTER — Other Ambulatory Visit: Payer: Self-pay

## 2021-11-07 NOTE — Patient Outreach (Signed)
Care Coordination ? ?11/07/2021 ? ?[place] ?06-14-1956 ?[id number] ? ?[name] is currently admitted as an inpatient at [facility] team will follow the progress of [name] and follow up upon discharge.  ? ? ?Lurena Joiner RN, BSN ?[place] ?RN Care Coordinator ? ? ?

## 2021-11-11 ENCOUNTER — Other Ambulatory Visit: Payer: Medicaid Other | Admitting: *Deleted

## 2021-11-11 ENCOUNTER — Telehealth: Payer: Self-pay | Admitting: Neurology

## 2021-11-11 NOTE — Telephone Encounter (Signed)
Pt cancelled appt due to being in the hospital ?

## 2021-11-13 ENCOUNTER — Other Ambulatory Visit: Payer: Self-pay | Admitting: *Deleted

## 2021-11-13 ENCOUNTER — Encounter: Payer: Medicaid Other | Admitting: Family

## 2021-11-13 DIAGNOSIS — L89312 Pressure ulcer of right buttock, stage 2: Secondary | ICD-10-CM | POA: Diagnosis not present

## 2021-11-13 DIAGNOSIS — G40909 Epilepsy, unspecified, not intractable, without status epilepticus: Secondary | ICD-10-CM | POA: Diagnosis not present

## 2021-11-13 DIAGNOSIS — K219 Gastro-esophageal reflux disease without esophagitis: Secondary | ICD-10-CM | POA: Diagnosis not present

## 2021-11-13 DIAGNOSIS — Z720 Tobacco use: Secondary | ICD-10-CM | POA: Diagnosis not present

## 2021-11-13 DIAGNOSIS — J449 Chronic obstructive pulmonary disease, unspecified: Secondary | ICD-10-CM | POA: Diagnosis not present

## 2021-11-13 DIAGNOSIS — Z9289 Personal history of other medical treatment: Secondary | ICD-10-CM | POA: Diagnosis not present

## 2021-11-13 DIAGNOSIS — M1990 Unspecified osteoarthritis, unspecified site: Secondary | ICD-10-CM | POA: Diagnosis not present

## 2021-11-13 NOTE — Patient Outreach (Signed)
Care Coordination ? ?11/13/2021 ? ?[place] ?07-19-1956 ?[id number] ? ?RNCM performing Case Closure. [facility] now providing services for Ms. Locust as verified by [facility]. RNCM notified [facility] of Ms. Feely recent admission. The provider over Ms. Kolton's case will contact her by phone. Ms. Degraff denied any urgent needs at this time ? ?Lurena Joiner RN, BSN ?[place] ?RN Care Coordinator ? ?

## 2021-11-14 ENCOUNTER — Other Ambulatory Visit: Payer: Self-pay | Admitting: Physician Assistant

## 2021-11-18 ENCOUNTER — Encounter: Payer: Self-pay | Admitting: Primary Care

## 2021-11-18 ENCOUNTER — Ambulatory Visit (INDEPENDENT_AMBULATORY_CARE_PROVIDER_SITE_OTHER): Payer: Medicaid Other

## 2021-11-18 ENCOUNTER — Ambulatory Visit: Payer: Medicaid Other | Admitting: Neurology

## 2021-11-18 ENCOUNTER — Ambulatory Visit (INDEPENDENT_AMBULATORY_CARE_PROVIDER_SITE_OTHER): Payer: Medicaid Other | Admitting: Primary Care

## 2021-11-18 VITALS — BP 108/64 | HR 75 | Temp 97.8°F | Ht 61.0 in | Wt 184.0 lb

## 2021-11-18 DIAGNOSIS — J449 Chronic obstructive pulmonary disease, unspecified: Secondary | ICD-10-CM

## 2021-11-18 DIAGNOSIS — I5033 Acute on chronic diastolic (congestive) heart failure: Secondary | ICD-10-CM

## 2021-11-18 DIAGNOSIS — J189 Pneumonia, unspecified organism: Secondary | ICD-10-CM

## 2021-11-18 DIAGNOSIS — G4733 Obstructive sleep apnea (adult) (pediatric): Secondary | ICD-10-CM

## 2021-11-18 DIAGNOSIS — J9611 Chronic respiratory failure with hypoxia: Secondary | ICD-10-CM

## 2021-11-18 DIAGNOSIS — F17200 Nicotine dependence, unspecified, uncomplicated: Secondary | ICD-10-CM

## 2021-11-18 LAB — CBC WITH DIFFERENTIAL/PLATELET
Basophils Absolute: 0.1 10*3/uL (ref 0.0–0.1)
Basophils Relative: 0.7 % (ref 0.0–3.0)
Eosinophils Absolute: 0.2 10*3/uL (ref 0.0–0.7)
Eosinophils Relative: 2.3 % (ref 0.0–5.0)
HCT: 33.7 % — ABNORMAL LOW (ref 36.0–46.0)
Hemoglobin: 10.7 g/dL — ABNORMAL LOW (ref 12.0–15.0)
Lymphocytes Relative: 21.3 % (ref 12.0–46.0)
Lymphs Abs: 1.6 10*3/uL (ref 0.7–4.0)
MCHC: 31.8 g/dL (ref 30.0–36.0)
MCV: 94 fl (ref 78.0–100.0)
Monocytes Absolute: 0.7 10*3/uL (ref 0.1–1.0)
Monocytes Relative: 8.6 % (ref 3.0–12.0)
Neutro Abs: 5.1 10*3/uL (ref 1.4–7.7)
Neutrophils Relative %: 67.1 % (ref 43.0–77.0)
Platelets: 264 10*3/uL (ref 150.0–400.0)
RBC: 3.59 Mil/uL — ABNORMAL LOW (ref 3.87–5.11)
RDW: 16.9 % — ABNORMAL HIGH (ref 11.5–15.5)
WBC: 7.6 10*3/uL (ref 4.0–10.5)

## 2021-11-18 LAB — BASIC METABOLIC PANEL
BUN: 17 mg/dL (ref 6–23)
CO2: 27 mEq/L (ref 19–32)
Calcium: 9.4 mg/dL (ref 8.4–10.5)
Chloride: 103 mEq/L (ref 96–112)
Creatinine, Ser: 1.05 mg/dL (ref 0.40–1.20)
GFR: 56.34 mL/min — ABNORMAL LOW (ref >60.00)
Glucose, Bld: 92 mg/dL (ref 70–99)
Potassium: 4.7 mEq/L (ref 3.5–5.1)
Sodium: 137 mEq/L (ref 135–145)

## 2021-11-18 LAB — BRAIN NATRIURETIC PEPTIDE: Pro B Natriuretic peptide (BNP): 16 pg/mL (ref 0.0–100.0)

## 2021-11-18 NOTE — Progress Notes (Signed)
Please let patient know follow-up CXR after hospitalization for pna showed clear lungs. No active disease. I will call if labs are abnormal

## 2021-11-18 NOTE — Patient Instructions (Addendum)
Recommendations: ?- Continue Breztri Aerosphere two puffs morning and evening ?- Continue Albuterol nebs (taper frequency 3 times a day x 3 days, 2 times a day x 3 days, 1 x day x 3 days then use as needed)  ?- Continue 2 L of oxygen 24/7  ?- Continue to work on smoking cessation ?- I was unable to pull download from CPAP, for now recommend in-lab titration study to figure out best settings for you  ? ?Orders: ?- CPAP titration study  ? ?Follow-up: ?- 4-8 weeks with Dr. Halford Chessman  ?

## 2021-11-18 NOTE — Progress Notes (Signed)
? ?'@Patient'$  ID: [name], female    DOB: 1956-05-27, 66 y.o.   MRN: [id number] ? ?Chief Complaint  ?Patient presents with  ? Follow-up  ?  Follow up. Patient is here to talk about her COPD.   ? ? ?Referring provider: ?[place], [place] Dragon, FNP ? ?HPI: ?66 year old female, current everyday smoker.  Past medical history significant for COPD, chronic respiratory failure, obstructive sleep apnea, diastolic heart failure, pulmonary hypertension, GERD, seizure disorder, hyperlipidemia.  Patient of Dr. Halford Chessman, last seen by pulmonary nurse practitioner on 09/27/2021.  Maintained on Breztri and 2 L of oxygen continuous.  Patient wears CPAP with oxygen at bedtime.  [place] with CPAP and 2 L on 09/04/2021>> baseline SPO2 94%, low SPO2 87%.  Spent 12 seconds with SPO2 less than 88% ? ?Previous [facility] pulmonary encounter: ?08/15/21- Dr. Halford Chessman ?She reports having a sleep study a couple of years ago.  Results not available.  She has been using CPAP and gets supplies through [employer].  She was started on 2 liters supplemental oxygen after being in hospital in May 2022.  She uses this 24/7.   ?  ?She is not very active.  Has to use a wheelchair.  Still smoking.  Used to smoke 2 ppd, but down to 1/2 ppd now.  Has cough with green sputum and intermittent wheezing.  Inhalers help some.  She has multiple antibiotic allergies.   ? ? ?11/18/2021 ?Patient presents today for overview. Since her last visit she was admitted [facility] on 3/28 for pneumonia, confusion and hypoxia. Family states she has not been wearing her CPAP at night but has been wearing oxygen. Previous discharged on 3/20 for COPD exacerbation. BNP was 1069, PCO2 52. CXR showed new bilateral interstitial and airspace opacities concerning for either multifocal infection or pulmonary edema. UA was positive for nitrates and large bacteria. She was treated with IV azactam and vancomycin. Switched to oral clindamycin. She was discharged April 1st. She is feeling better since then. She is using  Breztri two puffs morning and evening. She uses her albuterol nebulizer three times a day, she is tapering frequency since being discharged. She continues to have trouble wearing her CPAP at night, feels it is smothering her as if she is getting too much pressure. She has been wearing oxygen at night. She is smoking 1/2 ppd.  ? ? ? ?Allergies  ?Allergen Reactions  ? Amoxicillin Anaphylaxis  ?  Breakout, mouth swells  ? Azithromycin Swelling and Rash  ?  "swelling all over, hives, thrush"  ? Cefdinir   ?  Throat swelling after 3rd dose  ? Chantix [Varenicline] Other (See Comments)  ?  "bad dreams, difficulty breathing"  ? Doxycycline Hyclate Anaphylaxis and Rash  ?  swelling  ? Levofloxacin Anaphylaxis  ? Penicillins Anaphylaxis, Shortness Of Breath and Rash  ?  "ALMOST DIED, ended up in hospital"  ?PCN reaction causing immediate rash, facial/tongue/throat swelling, SOB or lightheadedness with hypotension: YES ?PCN reaction causing severe rash involving mucus membranes or skin necrosis: NO ?PCN reaction that required hospitalization YES ?Has patient had a PCN reaction occurring within the last 10 years: NO ?  ? Sulfonamide Derivatives Shortness Of Breath, Swelling and Rash  ?  "break out from head to toe"  ? Toradol [Ketorolac Tromethamine] Other (See Comments)  ?  Seizure ?  ? Clarithromycin Hives  ?  swelling  ? Nortriptyline Swelling, Rash and Other (See Comments)  ?  Per patient had "kidney problems" on this medication, could not  use bathroom (urinary retention)  ? Paxlovid [Nirmatrelvir-Ritonavir] Swelling  ?  Throat swelling  ? Codeine Nausea And Vomiting  ? Fluticasone-Salmeterol Other (See Comments)  ?  REACTION: ulcers in mouth  ? Triptans Rash  ?  Mouth breaks out and start itching  ? ? ?Immunization History  ?Administered Date(s) Administered  ? Influenza Split 05/22/2011, 05/05/2012  ? Influenza Whole 05/27/2007, 05/01/2008, 05/22/2009, 05/23/2010  ? Influenza,inj,Quad PF,6+ Mos 05/10/2013, 04/19/2014,  06/25/2015, 04/15/2016, 05/12/2017, 05/11/2019, 05/16/2020, 05/15/2021  ? PNEUMOCOCCAL CONJUGATE-20 07/02/2021  ? Pneumococcal Polysaccharide-23 08/12/1999, 04/19/2014, 06/11/2021  ? Td 12/09/2001  ? Tdap 01/05/2013  ? ? ?Past Medical History:  ?Diagnosis Date  ? Allergic rhinitis   ? Arthritis   ? "hands and legs and feet" (04/27/2012)  ? Asthma   ? Cancer [facility]) 2005  ? rt arm mole rem cancer  ? Chronic bronchitis ([place])   ? "keep it all the time" (04/27/2012)  ? COPD (chronic obstructive pulmonary disease) ([place])   ? COVID-19 virus infection   ? 03-2021  ? Depression   ? PT DENIES  ? Diverticulosis   ? DJD (degenerative joint disease)   ? Emphysema   ? Epilepsy ([place])   ? Esophageal stricture   ? Exertional dyspnea   ? GERD (gastroesophageal reflux disease)   ? Glaucoma   ? H/O blood clots   ? Right leg  ? History of COVID-19 03/2021  ? Hyperlipidemia   ? Hypertension   ? IBS (irritable bowel syndrome)   ? Migraine   ? Neck pain   ? Cervical disc  ? OSA on CPAP   ? 6-7 yrs; pt wears CPAP but has not been able to use it lately due to sinus issues  ? Oxygen deficiency   ? USES 3 LITERS 02 CONTINOUS  ? Pneumonia 2012; 04/27/2012  ? Recurrent upper respiratory infection (URI)   ? Right leg DVT ([place]) 1982  ? groin  ? Seizures ([place])   ? LAST SEIZURE 6 MONTHS OR  LONGER  ? Sleep apnea   ? Type II diabetes mellitus ([place])   ? No meds since weight loss  ? UTI (urinary tract infection)   ? 2 weeks ago  ? Vertigo 10/24/2014  ? ? ?Tobacco History: ?Social History  ? ?Tobacco Use  ?Smoking Status Every Day  ? Packs/day: 1.00  ? Years: 40.00  ? Pack years: 40.00  ? Types: Cigarettes  ? Start date: 08/12/1975  ? Last attempt to quit: 12/30/2020  ? Years since quitting: 0.8  ?Smokeless Tobacco Never  ?Tobacco Comments  ? 1-2 CIGS A DAY NOW   ? ?Ready to quit: Not Answered ?Counseling given: Not Answered ?Tobacco comments: 1-2 CIGS A DAY NOW  ? ? ?Outpatient Medications Prior to Visit  ?Medication Sig Dispense Refill  ? albuterol (PROAIR  HFA) 108 (90 Base) MCG/ACT inhaler INHALE TWO PUFFS INTO THE LUNGS EVERY 6 HOURS AS NEEDED FOR SHORTNESS OF BREATH 18 g 6  ? albuterol (PROVENTIL) (2.5 MG/3ML) 0.083% nebulizer solution USE 1 VIAL IN NEBULIZER EVERY 6 HOURS AS NEEDED FOR SHORTNESS OF BREATH 75 mL 8  ? ALLERGY RELIEF 10 MG tablet TAKE ONE TABLET BY MOUTH DAILY 90 tablet 1  ? aspirin EC 81 MG tablet Take 81 mg by mouth daily. Swallow whole.    ? Budeson-Glycopyrrol-Formoterol (BREZTRI AEROSPHERE) 160-9-4.8 MCG/ACT AERO Inhale 2 puffs into the lungs in the morning and at bedtime. 10.7 g 5  ? Cholecalciferol (VITAMIN D3) 50 MCG (2000 UT) capsule TAKE  ONE CAPSULE BY MOUTH DAILY 90 capsule 1  ? ferrous sulfate 324 (65 Fe) MG TBEC TAKE ONE TABLET BY MOUTH EVERY OTHER DAY 90 tablet 5  ? fluticasone (FLONASE) 50 MCG/ACT nasal spray Place 1 spray into both nostrils daily.    ? gabapentin (NEURONTIN) 100 MG capsule Take 1 capsule (100 mg total) by mouth at bedtime. (Patient taking differently: Take 100 mg by mouth 3 (three) times daily.) 90 capsule 3  ? lamoTRIgine (LAMICTAL) 200 MG tablet Take 1.5 tablets (300 mg total) by mouth 2 (two) times daily. 270 tablet 1  ? OXYGEN Inhale 3 L into the lungs continuous.    ? pantoprazole (PROTONIX) 40 MG tablet Take 1 tablet (40 mg total) by mouth 2 (two) times daily. 60 tablet 11  ? QUEtiapine (SEROQUEL) 100 MG tablet TAKE ONE TABLET BY MOUTH AT BEDTIME 30 tablet 0  ? Sennosides (EX-LAX PO) Take by mouth.    ? simvastatin (ZOCOR) 40 MG tablet TAKE ONE TABLET BY MOUTH AT BEDTIME 90 tablet 2  ? topiramate (TOPAMAX) 100 MG tablet Take 200 mg twice daily 360 tablet 3  ? ?No facility-administered medications prior to visit.  ? ? ?Review of Systems ? ?Review of Systems  ?Constitutional: Negative.   ?HENT: Negative.    ?Respiratory:  Negative for cough, chest tightness, shortness of breath and wheezing.   ?Cardiovascular:  Negative for leg swelling.  ? ? ?Physical Exam ? ?BP 108/64 (BP Location: Right Arm, Patient Position:  Sitting, Cuff Size: Normal)   Pulse 75   Temp 97.8 ?F (36.6 ?C) (Oral)   Ht '5\' 1"'$  (1.549 m)   Wt 184 lb (83.5 kg)   SpO2 99%   BMI 34.77 kg/m?  ?Physical Exam ?Constitutional:   ?   General: She is not in acut

## 2021-11-18 NOTE — Assessment & Plan Note (Signed)
-   Appears stable; No acute symptoms of cough or mucus production. LS with exp wheezing. Treated for PNA in March at [facility]. Checking repeat CXR. Continue Breztri Aerosphere and Albuterol nebulizer q6 hours prn.  ?

## 2021-11-18 NOTE — Assessment & Plan Note (Signed)
-   Continue 2L supplemental oxygen 24/7 ?

## 2021-11-18 NOTE — Assessment & Plan Note (Signed)
-   Hx Sleep apnea. On CPAP, not currently wearing consistently. Having difficulty with pressure setting. Download not available. Recommend checking in-lab CPAP titration study.  ?

## 2021-11-18 NOTE — Assessment & Plan Note (Signed)
-   Current 1/2 ppd smoker. Encourage smoking cessation.  ?

## 2021-11-18 NOTE — Progress Notes (Signed)
Please let patient know her labs looked fine. BNP normal. HGB improved/stable. Kidney function stable. CXR normal/ pna resolved.

## 2021-11-19 NOTE — Progress Notes (Signed)
Reviewed and agree with assessment/plan. ? ? ?Chesley Mires, MD ?[place] ?11/19/2021, 12:14 PM ?Pager:  [phone] ? ?

## 2021-11-20 ENCOUNTER — Ambulatory Visit (INDEPENDENT_AMBULATORY_CARE_PROVIDER_SITE_OTHER): Payer: Medicaid Other | Admitting: Neurology

## 2021-11-20 ENCOUNTER — Encounter: Payer: Self-pay | Admitting: Gastroenterology

## 2021-11-20 DIAGNOSIS — G40909 Epilepsy, unspecified, not intractable, without status epilepticus: Secondary | ICD-10-CM

## 2021-11-21 ENCOUNTER — Other Ambulatory Visit: Payer: Self-pay | Admitting: Neurology

## 2021-11-21 ENCOUNTER — Other Ambulatory Visit: Payer: Self-pay | Admitting: Physician Assistant

## 2021-11-22 NOTE — Procedures (Signed)
? ? ?  History: ? ?66 year old woman with seizure disorder, now with daily staring episodes.  ? ?EEG classification:  Awake and asleep ? ?Description of the recording: The background rhythms of this recording consists of medium amplitude background theta activity of  6- 7 Hz. As the record progresses, the patient initially is in the waking state, but appears to enter the early stage II sleep during the recording, with rudimentary sleep spindles and vertex sharp wave activity seen. During the wakeful state, photic stimulation is performed, and no abnormal responses were seen. Hyperventilation was also performed, no abnormal response seen. No epileptiform discharges seen during this recording. There was mild diffuse slowing. EKG monitor shows no evidence of cardiac rhythm abnormalities with a heart rate of 78. ? ?Abnormality: Mild diffuse slowing.   ? ?Impression: This is an abnormal EEG recording in the waking and sleeping state due to mild diffuse slowing. Diffuse slowing is consistent with a generalized brain dysfunction, nonspecific.   ? ? ?Alric Ran, MD ?[facility]  ?

## 2021-11-24 ENCOUNTER — Other Ambulatory Visit: Payer: Self-pay | Admitting: Family Medicine

## 2021-11-25 DIAGNOSIS — J9611 Chronic respiratory failure with hypoxia: Secondary | ICD-10-CM | POA: Diagnosis not present

## 2021-12-03 DIAGNOSIS — M1712 Unilateral primary osteoarthritis, left knee: Secondary | ICD-10-CM | POA: Diagnosis not present

## 2021-12-06 DIAGNOSIS — J441 Chronic obstructive pulmonary disease with (acute) exacerbation: Secondary | ICD-10-CM | POA: Diagnosis not present

## 2021-12-06 DIAGNOSIS — R0902 Hypoxemia: Secondary | ICD-10-CM | POA: Diagnosis not present

## 2021-12-11 ENCOUNTER — Other Ambulatory Visit: Payer: Self-pay | Admitting: Family Medicine

## 2021-12-11 DIAGNOSIS — H5213 Myopia, bilateral: Secondary | ICD-10-CM | POA: Diagnosis not present

## 2021-12-13 DIAGNOSIS — R350 Frequency of micturition: Secondary | ICD-10-CM | POA: Diagnosis not present

## 2021-12-13 DIAGNOSIS — N3020 Other chronic cystitis without hematuria: Secondary | ICD-10-CM | POA: Diagnosis not present

## 2021-12-27 ENCOUNTER — Ambulatory Visit: Payer: Medicaid Other | Admitting: Pulmonary Disease

## 2022-01-03 ENCOUNTER — Other Ambulatory Visit: Payer: Self-pay | Admitting: Family Medicine

## 2022-01-13 ENCOUNTER — Encounter: Payer: Self-pay | Admitting: Pulmonary Disease

## 2022-01-13 ENCOUNTER — Ambulatory Visit (INDEPENDENT_AMBULATORY_CARE_PROVIDER_SITE_OTHER): Payer: Medicaid Other | Admitting: Pulmonary Disease

## 2022-01-13 VITALS — BP 132/80 | HR 82 | Temp 97.9°F | Ht 61.0 in | Wt 185.0 lb

## 2022-01-13 DIAGNOSIS — G4733 Obstructive sleep apnea (adult) (pediatric): Secondary | ICD-10-CM

## 2022-01-13 DIAGNOSIS — J449 Chronic obstructive pulmonary disease, unspecified: Secondary | ICD-10-CM

## 2022-01-13 DIAGNOSIS — J9611 Chronic respiratory failure with hypoxia: Secondary | ICD-10-CM

## 2022-01-13 MED ORDER — AZELASTINE HCL 0.15 % NA SOLN: 1.0000 | Freq: Two times a day (BID) | NASAL | 5 refills | Status: DC | PRN

## 2022-01-13 MED ORDER — PREDNISONE 5 MG PO TABS: ORAL_TABLET | ORAL | 0 refills | Status: AC

## 2022-01-13 MED ORDER — MONTELUKAST SODIUM 10 MG PO TABS: 10.0000 mg | ORAL_TABLET | Freq: Every day | ORAL | 5 refills | Status: DC

## 2022-01-13 NOTE — Progress Notes (Signed)
[facility]  Chief Complaint  Patient presents with   Follow-up    Has no concerns or complaints.  States she has blood clots in left leg and is on blood thinners.      Past Surgical History:  She  has a past surgical history that includes Tubal ligation (2001); Knee arthroscopy (1990's); Carpal tunnel release (~ 2008); Shoulder open rotator cuff repair (~ 2010); Abdominal hysterectomy (2001); Total knee arthroplasty (08/23/2012); Total knee arthroplasty (08/23/2012); Cataract extraction (Bilateral); Colonoscopy with esophagogastroduodenoscopy (egd); Nasal septoplasty w/ turbinoplasty (Bilateral, 05/23/2016); Sinus endo with fusion (Right, 05/23/2016); Colonoscopy; Upper gastrointestinal endoscopy; Esophagogastroduodenoscopy (egd) with propofol (N/A, 04/16/2021); biopsy (04/16/2021); and Sinoscopy.  Past Medical History:  OA, COVID 29 March 2021, Diverticulosis, Epilepsy, GERD, Glaucoma, Rt leg DVT, HLD, HTN, IBS, Migraine headache, PNA, DM type 2  Constitutional:  BP 132/80 (BP Location: Left Wrist, Patient Position: Sitting)   Pulse 82   Temp 97.9 F (36.6 C) (Temporal)   Ht '5\' 1"'$  (1.549 m)   Wt 185 lb (83.9 kg)   SpO2 97% Comment: 2LO2 cont  BMI 34.96 kg/m   Brief Summary:  [name] is a 66 y.o. female smoker with COPD, obstructive sleep apnea, and chronic respiratory failure.      Subjective:   She is here with her husband.  Recent dx of lt leg DVT.  Has more sinus congestion and post nasal drip.  Getting cough and wheeze.  Not bringing up much sputum.  Using 2 liters oxygen.  Using Astral at night with O2.    Physical Exam:   Appearance - well kempt, wearing oxygen, in wheelchair  ENMT - no sinus tenderness, no oral exudate, no LAN, Mallampati 3 airway, no stridor, wears dentures  Respiratory - faint b/l wheeze  CV - s1s2 regular rate and rhythm, no murmurs  Ext - no clubbing, no edema  Skin - no  rashes  Psych - normal mood and affect    Pulmonary testing:  Spirometry 10/27/13 >> FEV1 1.51 (66%), FEV1% 68 PFT 09/27/21 >> FEV1 1.05 (47%), FEV1% 58. TLC 3.51 (76%), DLCO 52%  Chest Imaging:  HRCT chest 11/07/15 >> upper lobe scarring CT chest 05/11/21 >> centrilobular and paraseptal emphysema  Sleep Tests:  ONO with CPAP and 2 liters 09/04/21 >> test time 6 hrs 33 min.  Baseline SpO2 94%, low SpO2 87%.  Spent 12 sec with SpO2 < 88%.  Cardiac Tests:  Echo 09/03/21 >> EF 60 to 65%, grade 1 DD Doppler Lt leg 12/26/21 >> DVT Lt common femoral vein  Social History:  She  reports that she has been smoking cigarettes. She started smoking about 46 years ago. She has a 40.00 pack-year smoking history. She has never used smokeless tobacco. She reports that she does not drink alcohol and does not use drugs.  Family History:  Her family history includes Alcohol abuse in her father; Bipolar disorder in her son; Breast cancer in her maternal aunt; Diabetes in her brother, maternal aunt, maternal grandfather, maternal uncle, and mother; Emphysema in her father; Esophageal cancer in her maternal uncle; Heart disease in her maternal grandfather, sister, son, and another family member; Lung cancer in her father and mother; Muscular dystrophy in her son; Other in her sister; Seizures in her brother; Stroke in her mother.     Assessment/Plan:   COPD with chronic bronchitis and emphysema. - she has multiple Abx allergies; don't think she needs ABx at present - will give  additional course of prednisone - continue breztri with prn albuterol - add singulair  Allergic rhinitis. - continue flonase, loratadine - add astepro and singulair  Obstructive sleep apnea. - reports compliance with therapy and benefit from NIV - she uses [employer] for her DME - continue Astral  Chronic respiratory failure with hypoxia. - she uses 2 liters 24/7 - gets supplies from [employer]   Time Spent Involved in Patient Care  on Day of Examination:  38 minutes  Follow up:   Patient Instructions  Prednisone 5 mg pill >> 3 pills daily for 2 days, 2 pills daily for 2 days, 1 pill daily for 2 days  Singulair 10 mg pill nightly  Astepro 1 spray in each nostril in the morning and evening as needed for allergy symptoms  Follow up in 4 months  Medication List:   Allergies as of 01/13/2022       Reactions   Amoxicillin Anaphylaxis   Breakout, mouth swells   Azithromycin Swelling, Rash   "swelling all over, hives, thrush"   Cefdinir    Throat swelling after 3rd dose   Chantix [varenicline] Other (See Comments)   "bad dreams, difficulty breathing"   Doxycycline Hyclate Anaphylaxis, Rash   swelling   Levofloxacin Anaphylaxis   Penicillins Anaphylaxis, Shortness Of Breath, Rash   "ALMOST DIED, ended up in hospital"  PCN reaction causing immediate rash, facial/tongue/throat swelling, SOB or lightheadedness with hypotension: YES PCN reaction causing severe rash involving mucus membranes or skin necrosis: NO PCN reaction that required hospitalization YES Has patient had a PCN reaction occurring within the last 10 years: NO   Sulfonamide Derivatives Shortness Of Breath, Swelling, Rash   "break out from head to toe"   Toradol [ketorolac Tromethamine] Other (See Comments)   Seizure   Clarithromycin Hives   swelling   Nortriptyline Swelling, Rash, Other (See Comments)   Per patient had "kidney problems" on this medication, could not use bathroom (urinary retention)   Paxlovid [nirmatrelvir-ritonavir] Swelling   Throat swelling   Codeine Nausea And Vomiting   Fluticasone-salmeterol Other (See Comments)   REACTION: ulcers in mouth   Triptans Rash   Mouth breaks out and start itching        Medication List        Accurate as of January 13, 2022 11:10 AM. If you have any questions, ask your nurse or doctor.          albuterol 108 (90 Base) MCG/ACT inhaler Commonly known as: ProAir HFA INHALE TWO PUFFS  INTO THE LUNGS EVERY 6 HOURS AS NEEDED FOR SHORTNESS OF BREATH   albuterol (2.5 MG/3ML) 0.083% nebulizer solution Commonly known as: PROVENTIL USE 1 VIAL IN NEBULIZER EVERY 6 HOURS AS NEEDED FOR SHORTNESS OF BREATH   Allergy Relief 10 MG tablet Generic drug: loratadine TAKE ONE TABLET BY MOUTH DAILY   aspirin EC 81 MG tablet Take 81 mg by mouth daily. Swallow whole.   Azelastine HCl 0.15 % Soln Commonly known as: Astepro Place 1 spray into the nose 2 (two) times daily as needed (Allergies). Started by: Chesley Mires, MD   Arnell Sieving [phone] MCG/ACT Aero Generic drug: Budeson-Glycopyrrol-Formoterol Inhale 2 puffs into the lungs in the morning and at bedtime.   EX-LAX PO Take by mouth.   ferrous sulfate 324 (65 Fe) MG Tbec TAKE ONE TABLET BY MOUTH EVERY OTHER DAY   fluticasone 50 MCG/ACT nasal spray Commonly known as: FLONASE Place 1 spray into both nostrils daily.   gabapentin 100 MG capsule  Commonly known as: NEURONTIN Take 1 capsule (100 mg total) by mouth at bedtime. What changed: when to take this   lamoTRIgine 200 MG tablet Commonly known as: LAMICTAL Take 1.5 tablets (300 mg total) by mouth 2 (two) times daily.   montelukast 10 MG tablet Commonly known as: SINGULAIR Take 1 tablet (10 mg total) by mouth at bedtime. Started by: Chesley Mires, MD   OXYGEN Inhale 3 L into the lungs continuous.   pantoprazole 40 MG tablet Commonly known as: PROTONIX TAKE ONE TABLET BY MOUTH TWICE DAILY   predniSONE 5 MG tablet Commonly known as: DELTASONE Take 3 tablets (15 mg total) by mouth daily with breakfast for 2 days, THEN 2 tablets (10 mg total) daily with breakfast for 2 days, THEN 1 tablet (5 mg total) daily with breakfast for 2 days. Start taking on: January 13, 2022 Started by: Chesley Mires, MD   QUEtiapine 100 MG tablet Commonly known as: SEROQUEL TAKE ONE TABLET BY MOUTH AT BEDTIME   simvastatin 40 MG tablet Commonly known as: ZOCOR TAKE ONE TABLET BY  MOUTH AT BEDTIME   topiramate 100 MG tablet Commonly known as: TOPAMAX Take 200 mg twice daily   Vitamin D3 50 MCG (2000 UT) capsule TAKE ONE CAPSULE BY MOUTH DAILY        Signature:  Chesley Mires, MD [place] Pager - [phone] 01/13/2022, 11:10 AM

## 2022-01-13 NOTE — Patient Instructions (Signed)
Prednisone 5 mg pill >> 3 pills daily for 2 days, 2 pills daily for 2 days, 1 pill daily for 2 days  Singulair 10 mg pill nightly  Astepro 1 spray in each nostril in the morning and evening as needed for allergy symptoms  Follow up in 4 months

## 2022-01-20 ENCOUNTER — Ambulatory Visit: Payer: Medicaid Other | Admitting: Neurology

## 2022-01-29 ENCOUNTER — Other Ambulatory Visit: Payer: Self-pay | Admitting: Physician Assistant

## 2022-01-29 ENCOUNTER — Other Ambulatory Visit: Payer: Self-pay | Admitting: Neurology

## 2022-02-10 ENCOUNTER — Other Ambulatory Visit: Payer: Self-pay | Admitting: Family Medicine

## 2022-02-12 ENCOUNTER — Other Ambulatory Visit: Payer: Self-pay | Admitting: Family Medicine

## 2022-02-12 ENCOUNTER — Ambulatory Visit: Payer: Medicaid Other | Admitting: Neurology

## 2022-02-19 ENCOUNTER — Encounter: Payer: Medicaid Other | Admitting: Pulmonary Disease

## 2022-02-19 ENCOUNTER — Other Ambulatory Visit: Payer: Self-pay | Admitting: Family Medicine

## 2022-02-25 ENCOUNTER — Other Ambulatory Visit: Payer: Self-pay | Admitting: Family Medicine

## 2022-02-26 ENCOUNTER — Other Ambulatory Visit: Payer: Self-pay | Admitting: Physician Assistant

## 2022-03-14 ENCOUNTER — Telehealth: Payer: Self-pay | Admitting: Pulmonary Disease

## 2022-03-14 MED ORDER — PREDNISONE 20 MG PO TABS: 40.0000 mg | ORAL_TABLET | Freq: Every day | ORAL | 0 refills | Status: AC

## 2022-03-14 NOTE — Telephone Encounter (Signed)
ATC patient.  LMTCB. 

## 2022-03-14 NOTE — Telephone Encounter (Signed)
Called and spoke with pt letting her know recs per ND and she verbalized understanding. Nothing further needed.

## 2022-03-14 NOTE — Telephone Encounter (Signed)
Called patient and she states for the last 3-4 days she is having another COPD flare up. She states that she is having SOB and is having to use her nebulizer medications more then normal. She denies any other symptoms at this time. She is requesting for prednisone to be called in for her.   Dr Shearon Stalls please advise since Dr Halford Chessman is out of office

## 2022-03-14 NOTE — Telephone Encounter (Signed)
Prednisone sent to pharmacy. Needs appointment if not improved with this.

## 2022-03-20 ENCOUNTER — Ambulatory Visit: Payer: Medicaid Other | Admitting: Neurology

## 2022-03-20 ENCOUNTER — Encounter: Payer: Self-pay | Admitting: Neurology

## 2022-03-20 VITALS — BP 92/62 | HR 90 | Ht 61.0 in | Wt 192.0 lb

## 2022-03-20 DIAGNOSIS — M542 Cervicalgia: Secondary | ICD-10-CM

## 2022-03-20 MED ORDER — TIZANIDINE HCL 4 MG PO TABS: 4.0000 mg | ORAL_TABLET | Freq: Four times a day (QID) | ORAL | 1 refills | Status: DC | PRN

## 2022-03-20 NOTE — Patient Instructions (Addendum)
-  Continue with Topamax 200 mg twice a day, continue Lamictal 300 mg twice a day -Recent EEG with mild diffuse slowing, no epileptiform discharges.  -Trial of Tizanidine  -Referral to physical therapy. -Will follow-up in 6 months

## 2022-03-20 NOTE — Progress Notes (Signed)
PATIENT: [name] DOB: 02/07/1956  REASON FOR VISIT: follow up HISTORY FROM: patient Primary Neurologist: Dr. April Manson since Dr [name] retired   HISTORY OF PRESENT ILLNESS: Today 03/20/22. Patient presents today for follow up. Reports no seizures since last visit. She is compliant with her Topiramate and Lamotrigine but now has a new complaint of neck pain. Her EEG was repeated and showed only mild diffuse slowing, no epileptiform discharges. She is not on supplemental oxygen any longer.   INTERVAL HISTORY 10/30/2021: Patient presents today for follow-up, she is accompanied by her daughter-in-law.  She was recently admitted at [facility] for heart failure, UTI and COPD.  Daughter reported that this is her fourth hospitalization since May 2022 for COPD and UTI.  Currently she is on supplemental O2 oxygen.  Daughter in law still report episode of staring, and they are now frequent, she has them daily up to 4 times per day.  During this time she is unresponsive but no other abnormal movement noted.  At last visit, we have plan to obtain an ambulatory EEG but this was not covered by insurance, she did have some issue with transportation therefore could not go to the EMU.  She is currently on lamotrigine 200 mg twice daily and Topamax 200 mg twice daily.  Denies any side effect from the medication.   Interval history 07/24/2021:  Lorilyn Laitinen here today for follow-up, hasn't been seen since Aug 2021, at that time referred to EMU with Dr. Hortense Ramal for intractable epilepsy. Remains on Lamictal 300 mg BID, Topamax 100/200 mg daily  Has seizures where she will blank out (2 this month), stare off, witnessed by her grandson, lasted no more than 1 minute. Was normal beforehand. Afterwards was tired, groggy, wanted to sleep. In the morning, 2 hours after taking morning pills. Will have spell on average, every 2 months. Can't identify any triggers.  She lives in [place], didn't have transportation, she cancelled the EMU  admission.   Headaches are improved. Not daily anymore. Doesn't eat more than 1 meal a day. Takes Tylenol, gets 1 migraine a month that is is significant.   Recently hospitalized for UTI was septic. Still on Seroquel 100 mg at bedtime to help sleep. Is wearing 2 L oxygen daily, forgot it today. Still smoking, 5 cigarette daily. Here today with her grandson, Sydnee Cabal.  Update 04/10/2020 SS:Ms. Bisher is a 66 year old female with history of intractable epilepsy and intractable headaches. She is on Lamictal and Topamax, when last seen Depakote was added. She complained of swallowing problems, barium swallow recommended caution with bread and meats, thin liquids, had mild pharyngeal dysphagia.  Could not tolerate Depakote, reported stomach upset, took for 1 week.  Reports 1 seizure of loss of consciousness while lying on the bed, triggered by coughing episode, she says not always coughing related, usually once a month.  Does have episodes of staring, lasting a few seconds, several times a month.  Headaches are fairly well controlled, on average 3-4 migraines a month, tolerating Aimovig 70 mg every 2 weeks well, has been quite beneficial.  Has noted that her voice is raspy, continues with trouble swallowing, does have COPD, still smokes.  Recently saw PCP, did not mention this.  She does not drive, lives with family.  HISTORY 10/12/2019 Dr. [name]: [name] is a 66 year old right-handed white female with a history of intractable epilepsy and intractable headache.  The patient has been on lamotrigine and Topamax without much benefit with her seizures.  She  continues to have frequent headaches, her headaches have been daily in nature.  She has not responded to Ajovy.  In the past, she has been on Botox injections and seemed to improve some with the headaches.  More recently in the last month she is having increasing problems with swallowing with both liquids and solids.  She indicates that food will get caught in  the back of the throat and she has to cough it back up again.  She is not getting food down in the lungs.  She has noted some hoarseness of the voice.  She is not losing weight, in fact she is gaining weight.  The patient does not operate a motor vehicle.  She was set up for video visit today but cannot access this, a telephone visit therefore was done.  REVIEW OF SYSTEMS: Out of a complete 14 system review of symptoms, the patient complains only of the following symptoms, and all other reviewed systems are negative.  Seizure, headache  ALLERGIES: Allergies  Allergen Reactions   Amoxicillin Anaphylaxis    Breakout, mouth swells   Azithromycin Swelling and Rash    "swelling all over, hives, thrush"   Cefdinir     Throat swelling after 3rd dose   Chantix [Varenicline] Other (See Comments)    "bad dreams, difficulty breathing"   Doxycycline Hyclate Anaphylaxis and Rash    swelling   Levofloxacin Anaphylaxis   Penicillins Anaphylaxis, Shortness Of Breath and Rash    "ALMOST DIED, ended up in hospital"  PCN reaction causing immediate rash, facial/tongue/throat swelling, SOB or lightheadedness with hypotension: YES PCN reaction causing severe rash involving mucus membranes or skin necrosis: NO PCN reaction that required hospitalization YES Has patient had a PCN reaction occurring within the last 10 years: NO    Sulfonamide Derivatives Shortness Of Breath, Swelling and Rash    "break out from head to toe"   Toradol [Ketorolac Tromethamine] Other (See Comments)    Seizure    Clarithromycin Hives    swelling   Nortriptyline Swelling, Rash and Other (See Comments)    Per patient had "kidney problems" on this medication, could not use bathroom (urinary retention)   Paxlovid [Nirmatrelvir-Ritonavir] Swelling    Throat swelling   Codeine Nausea And Vomiting   Fluticasone-Salmeterol Other (See Comments)    REACTION: ulcers in mouth   Triptans Rash    Mouth breaks out and start itching     HOME MEDICATIONS: Outpatient Medications Prior to Visit  Medication Sig Dispense Refill   albuterol (PROAIR HFA) 108 (90 Base) MCG/ACT inhaler INHALE TWO PUFFS INTO THE LUNGS EVERY 6 HOURS AS NEEDED FOR SHORTNESS OF BREATH 18 g 6   albuterol (PROVENTIL) (2.5 MG/3ML) 0.083% nebulizer solution USE 1 VIAL IN NEBULIZER EVERY 6 HOURS AS NEEDED FOR SHORTNESS OF BREATH 75 mL 8   ALLERGY RELIEF 10 MG tablet TAKE ONE TABLET BY MOUTH DAILY 90 tablet 1   aspirin EC 81 MG tablet Take 81 mg by mouth daily. Swallow whole.     Azelastine HCl (ASTEPRO) 0.15 % SOLN Place 1 spray into the nose 2 (two) times daily as needed (Allergies). 30 mL 5   Budeson-Glycopyrrol-Formoterol (BREZTRI AEROSPHERE) 160-9-4.8 MCG/ACT AERO Inhale 2 puffs into the lungs in the morning and at bedtime. 10.7 g 5   Cholecalciferol (VITAMIN D3) 50 MCG (2000 UT) capsule TAKE ONE CAPSULE BY MOUTH DAILY 90 capsule 1   ferrous sulfate 324 (65 Fe) MG TBEC TAKE ONE TABLET BY MOUTH EVERY OTHER DAY 90  tablet 5   fluticasone (FLONASE) 50 MCG/ACT nasal spray Place 1 spray into both nostrils daily.     gabapentin (NEURONTIN) 100 MG capsule Take 1 capsule (100 mg total) by mouth at bedtime. (Patient taking differently: Take 100 mg by mouth 3 (three) times daily.) 90 capsule 3   lamoTRIgine (LAMICTAL) 200 MG tablet TAKE 1 AND 1/2 TABLETS BY MOUTH TWICE DAILY 270 tablet 1   montelukast (SINGULAIR) 10 MG tablet Take 1 tablet (10 mg total) by mouth at bedtime. 30 tablet 5   OXYGEN Inhale 3 L into the lungs continuous.     pantoprazole (PROTONIX) 40 MG tablet Take 1 tablet (40 mg total) by mouth 2 (two) times daily. **PLEASE CALL OFFICE TO SCHEDULE FOLLOW UP 60 tablet 0   QUEtiapine (SEROQUEL) 100 MG tablet TAKE ONE TABLET BY MOUTH AT BEDTIME 30 tablet 5   Sennosides (EX-LAX PO) Take by mouth.     simvastatin (ZOCOR) 40 MG tablet TAKE ONE TABLET BY MOUTH AT BEDTIME 90 tablet 2   topiramate (TOPAMAX) 100 MG tablet Take 200 mg twice daily 360 tablet 3    No facility-administered medications prior to visit.    PAST MEDICAL HISTORY: Past Medical History:  Diagnosis Date   Allergic rhinitis    Arthritis    "hands and legs and feet" (04/27/2012)   Asthma    Cancer ([place]) 2005   rt arm mole rem cancer   Chronic bronchitis ([place])    "keep it all the time" (04/27/2012)   COPD (chronic obstructive pulmonary disease) ([place])    COVID-19 virus infection    03-2021   Depression    PT DENIES   Diverticulosis    DJD (degenerative joint disease)    Emphysema    Epilepsy (HCC)    Esophageal stricture    Exertional dyspnea    GERD (gastroesophageal reflux disease)    Glaucoma    H/O blood clots    Right leg   History of COVID-19 03/2021   Hyperlipidemia    Hypertension    IBS (irritable bowel syndrome)    Migraine    Neck pain    Cervical disc   OSA on CPAP    6-7 yrs; pt wears CPAP but has not been able to use it lately due to sinus issues   Oxygen deficiency    USES 3 LITERS 02 CONTINOUS   Pneumonia 2012; 04/27/2012   Recurrent upper respiratory infection (URI)    Right leg DVT ([place]) 1982   groin   Seizures ([place])    LAST SEIZURE 6 MONTHS OR  LONGER   Sleep apnea    Type II diabetes mellitus (HCC)    No meds since weight loss   UTI (urinary tract infection)    2 weeks ago   Vertigo 10/24/2014    PAST SURGICAL HISTORY: Past Surgical History:  Procedure Laterality Date   ABDOMINAL HYSTERECTOMY  2001   "partial"   BIOPSY  04/16/2021   Procedure: BIOPSY;  Surgeon: Mauri Pole, MD;  Location: [facility];  Service: Endoscopy;;   CARPAL TUNNEL RELEASE  ~ 2008   right   CATARACT EXTRACTION Bilateral    COLONOSCOPY     COLONOSCOPY WITH ESOPHAGOGASTRODUODENOSCOPY (EGD)     ESOPHAGOGASTRODUODENOSCOPY (EGD) WITH PROPOFOL N/A 04/16/2021   Procedure: ESOPHAGOGASTRODUODENOSCOPY (EGD) WITH PROPOFOL;  Surgeon: Mauri Pole, MD;  Location: [facility];  Service: Endoscopy;  Laterality: N/A;   KNEE ARTHROSCOPY  1990's    left   NASAL SEPTOPLASTY W/  TURBINOPLASTY Bilateral 05/23/2016   Procedure: NASAL SEPTOPLASTY WITH TURBINATE REDUCTION;  Surgeon: Jerrell Belfast, MD;  Location: [facility];  Service: ENT;  Laterality: Bilateral;   SHOULDER OPEN ROTATOR CUFF REPAIR  ~ 2010   left   SINOSCOPY     SINUS ENDO WITH FUSION Right 05/23/2016   Procedure: LIMITED RIGHT ENDOSCOPIC SINUS SURGERY;  Surgeon: Jerrell Belfast, MD;  Location: [facility];  Service: ENT;  Laterality: Right;   TOTAL KNEE ARTHROPLASTY  08/23/2012   right   TOTAL KNEE ARTHROPLASTY  08/23/2012   Procedure: TOTAL KNEE ARTHROPLASTY;  Surgeon: Lorn Junes, MD;  Location: [facility];  Service: Orthopedics;  Laterality: Right;  RIGHT KNEE ARTHROPLASTY MEDIAL AND LATERAL COMPARTMENTS WITH PATELLA RESURFACING   TUBAL LIGATION  2001   UPPER GASTROINTESTINAL ENDOSCOPY      FAMILY HISTORY: Family History  Problem Relation Age of Onset   Diabetes Mother    Lung cancer Mother        was a smoker   Stroke Mother    Alcohol abuse Father    Lung cancer Father        was a smoker   Emphysema Father        was a smoker   Heart disease Sister    Other Sister        blood clotting disorder   Diabetes Brother    Seizures Brother    Diabetes Maternal Aunt    Breast cancer Maternal Aunt    Esophageal cancer Maternal Uncle    Diabetes Maternal Uncle    Heart disease Maternal Grandfather    Diabetes Maternal Grandfather    Bipolar disorder Son    Muscular dystrophy Son    Heart disease Son    Heart disease Other        uncle   Colon cancer Neg Hx    Pancreatic cancer Neg Hx    Stomach cancer Neg Hx    Colon polyps Neg Hx    Rectal cancer Neg Hx     SOCIAL HISTORY: Social History   Socioeconomic History   Marital status: Married    Spouse name: [name]   Number of children: 2   Years of education: 12   Highest education level: Not on file  Occupational History   Occupation: disabled  Tobacco Use   Smoking status: Every Day    Packs/day: 1.00     Years: 40.00    Total pack years: 40.00    Types: Cigarettes    Start date: 08/12/1975    Last attempt to quit: 12/30/2020    Years since quitting: 1.2   Smokeless tobacco: Never   Tobacco comments:    1-2 CIGS A DAY NOW   Vaping Use   Vaping Use: Never used  Substance and Sexual Activity   Alcohol use: No    Alcohol/week: 0.0 standard drinks of alcohol   Drug use: No   Sexual activity: Never  Other Topics Concern   Not on file  Social History Narrative   Patient lives at home with husband [name], her son and his wife.    Patient has 2 children.    Patient has a high school education.    Patient is currently unemployed.    Patient is right handed.    Patient drinks 2 cups of caffeine per day.   Social Determinants of Health   Financial Resource Strain: High Risk (09/13/2021)   Overall Financial Resource Strain (CARDIA)    Difficulty of Paying Living  Expenses: Very hard  Food Insecurity: Food Insecurity Present (09/13/2021)   Hunger Vital Sign    Worried About Running Out of Food in the Last Year: Often true    Ran Out of Food in the Last Year: Often true  Transportation Needs: No Transportation Needs (09/13/2021)   PRAPARE - Hydrologist (Medical): No    Lack of Transportation (Non-Medical): No  Physical Activity: Inactive (08/13/2021)   Exercise Vital Sign    Days of Exercise per Week: 0 days    Minutes of Exercise per Session: 0 min  Stress: Not on file  Social Connections: Moderately Integrated (08/13/2021)   Social Connection and Isolation Panel [NHANES]    Frequency of Communication with Friends and Family: More than three times a week    Frequency of Social Gatherings with Friends and Family: More than three times a week    Attends Religious Services: Never    Marine scientist or Organizations: Yes    Attends Archivist Meetings: Never    Marital Status: Married  Human resources officer Violence: Not on file   PHYSICAL  EXAM  Vitals:   03/20/22 1313  BP: 92/62  Pulse: 90  Weight: 192 lb (87.1 kg)  Height: '5\' 1"'$  (1.549 m)    Body mass index is 36.28 kg/m.  Generalized: Well developed, in her wheelchair  Neurological examination  Mentation: Alert oriented to time, place, history taking. Follows all commands speech and language fluent Cranial nerve II-XII: Pupils were equal round reactive to light. Extraocular movements were full, visual field were full on confrontational test. Facial sensation and strength were normal.  Head turning and shoulder shrug  were normal and symmetric. Motor: Good strength of all extremities Sensory: Sensory testing is intact to soft touch on all 4 extremities. No evidence of extinction is noted.  Coordination: Cerebellar testing reveals good finger-nose-finger Gait and station: In wheelchair, not tested Reflexes: Deep tendon reflexes are symmetric but decreased throughout  DIAGNOSTIC DATA (LABS, IMAGING, TESTING) - I reviewed patient records, labs, notes, testing and imaging myself where available.  Lab Results  Component Value Date   WBC 7.6 11/18/2021   HGB 10.7 (L) 11/18/2021   HCT 33.7 (L) 11/18/2021   MCV 94.0 11/18/2021   PLT 264.0 11/18/2021      Component Value Date/Time   NA 137 11/18/2021 0938   NA 142 05/24/2021 0950   K 4.7 11/18/2021 0938   CL 103 11/18/2021 0938   CO2 27 11/18/2021 0938   GLUCOSE 92 11/18/2021 0938   BUN 17 11/18/2021 0938   BUN 13 05/24/2021 0950   CREATININE 1.05 11/18/2021 0938   CREATININE 0.91 06/13/2016 1441   CALCIUM 9.4 11/18/2021 0938   PROT 7.2 08/21/2021 1120   ALBUMIN 4.1 08/21/2021 1120   AST 12 08/21/2021 1120   ALT 6 08/21/2021 1120   ALKPHOS 114 08/21/2021 1120   BILITOT <0.2 08/21/2021 1120   GFRNONAA 59 (L) 04/10/2020 1418   GFRNONAA 70 06/13/2016 1441   GFRAA 68 04/10/2020 1418   GFRAA 80 06/13/2016 1441   Lab Results  Component Value Date   CHOL 184 08/21/2021   HDL 91 08/21/2021   LDLCALC 75  08/21/2021   LDLDIRECT 77 09/14/2012   TRIG 106 08/21/2021   CHOLHDL 2.0 08/21/2021   Lab Results  Component Value Date   HGBA1C 5.5 05/16/2020   Lab Results  Component Value Date   VITAMINB12 414 01/21/2008   Lab Results  Component  Value Date   TSH 2.314 06/19/2014   Routine EEG 11/20/21: Mild diffuse slowing.    ASSESSMENT AND PLAN 66 y.o. year old female  has a past medical history of Allergic rhinitis, Arthritis, Asthma, Cancer ([place]) (2005), Chronic bronchitis ([place]), COPD (chronic obstructive pulmonary disease) ([place]), COVID-19 virus infection, Depression, Diverticulosis, DJD (degenerative joint disease), Emphysema, Epilepsy ([place]), Esophageal stricture, Exertional dyspnea, GERD (gastroesophageal reflux disease), Glaucoma, H/O blood clots, History of COVID-19 (03/2021), Hyperlipidemia, Hypertension, IBS (irritable bowel syndrome), Migraine, Neck pain, OSA on CPAP, Oxygen deficiency, Pneumonia (2012; 04/27/2012), Recurrent upper respiratory infection (URI), Right leg DVT ([place]) (1982), Seizures ([place]), Sleep apnea, Type II diabetes mellitus ([place]), UTI (urinary tract infection), and Vertigo (10/24/2014). here with:  1.  Intractable epilepsy -Continue with Topamax 200 mg twice a day, continue Lamictal 300 mg twice a day -Recent EEG with mild diffuse slowing, no epileptiform discharges.  -Will follow-up in 6 months   2.  Intractable headaches -Headaches are improved, continue with topiramate  3.  Insomnia -Has remained on Seroquel 100 mg at bedtime  4. Cervicalgia  -Trial of Tizanidine  -Referral to physical therapy.   Orders Placed This Encounter  Procedures   Ambulatory referral to Physical Therapy   I have spent a total of 30 minutes dedicated to this patient today, preparing to see patient, performing a medically appropriate examination and evaluation, ordering tests and/or medications and procedures, and counseling and educating the patient/family/caregiver; independently  interpreting result and communicating results to the family/patient/caregiver; and documenting clinical information in the electronic medical record.    Alric Ran, MD 03/20/2022, 1:47 PM [facility] [address], [place], [place] [address] [phone]

## 2022-03-31 ENCOUNTER — Ambulatory Visit: Payer: Medicaid Other | Attending: Primary Care | Admitting: Pulmonary Disease

## 2022-03-31 DIAGNOSIS — G4733 Obstructive sleep apnea (adult) (pediatric): Secondary | ICD-10-CM

## 2022-04-01 DIAGNOSIS — G4733 Obstructive sleep apnea (adult) (pediatric): Secondary | ICD-10-CM | POA: Diagnosis not present

## 2022-04-01 NOTE — Procedures (Signed)
     Patient Name: [name], [name] Date: 03/31/2022 Gender: Female D.O.B: 02/05/1956 Age (years): 77 Referring Provider: Geraldo Pitter NP Height (inches): 61 Interpreting Physician: Chesley Mires MD, ABSM Weight (lbs): 185 RPSGT: Rosebud Poles BMI: 35 MRN: [id number] Neck Size: 14.50  CLINICAL INFORMATION The patient is referred for a CPAP titration to treat sleep apnea.  SLEEP STUDY TECHNIQUE As per the AASM Manual for the Scoring of Sleep and Associated Events v2.3 (April 2016) with a hypopnea requiring 4% desaturations.  The channels recorded and monitored were frontal, central and occipital EEG, electrooculogram (EOG), submentalis EMG (chin), nasal and oral airflow, thoracic and abdominal wall motion, anterior tibialis EMG, snore microphone, electrocardiogram, and pulse oximetry. Continuous positive airway pressure (CPAP) was initiated at the beginning of the study and titrated to treat sleep-disordered breathing.  MEDICATIONS Medications self-administered by patient taken the night of the study : N/A  TECHNICIAN COMMENTS Comments added by technician: CPAP therapy started at 4 CWP. Titration increased to 9 CWP due to an increase in REM stages. Wheezing and coughing episodes increased during latter part of study. Patient became uncomfortable during latter part of study. Patient tolerated CPAP fairly well Comments added by scorer: N/A  RESPIRATORY PARAMETERS Optimal PAP Pressure (cm): 9 AHI at Optimal Pressure (/hr): 0 Overall Minimal O2 (%): 79.00 Supine % at Optimal Pressure (%): 14 Minimal O2 at Optimal Pressure (%): 89.0    She wore 2 liters oxygen during this study.  SLEEP ARCHITECTURE The study was initiated at 11:03:40 PM and ended at 5:27:40 AM.  Sleep onset time was 11.4 minutes and the sleep efficiency was 56.1%. The total sleep time was 215.5 minutes.  The patient spent 0.70% of the night in stage N1 sleep, 41.53% in stage N2 sleep, 45.01% in stage N3  and 12.8% in REM.Stage REM latency was 61.0 minutes  Wake after sleep onset was 157.1. Alpha intrusion was absent. Supine sleep was 81.67%.  CARDIAC DATA The 2 lead EKG demonstrated sinus rhythm. The mean heart rate was 67.44 beats per minute. Other EKG findings include: PVCs.  LEG MOVEMENT DATA The total Periodic Limb Movements of Sleep (PLMS) were 0. The PLMS index was 0.00. A PLMS index of <15 is considered normal in adults.  IMPRESSIONS - She did best with CPAP 9 cm H2O. - She wore 2 liters supplemental oxygen with CPAP.  DIAGNOSIS - Obstructive Sleep Apnea (G47.33)  RECOMMENDATIONS - Trial of CPAP therapy on 9 cm H2O with 2 liters supplemental oxygen. - She was fitted with a Small size Resmed Full Face AirFit F20 mask and heated humidification. - Avoid alcohol, sedatives and other CNS depressants that may worsen sleep apnea and disrupt normal sleep architecture. - Sleep hygiene should be reviewed to assess factors that may improve sleep quality. - Weight management and regular exercise should be initiated or continued.  [Electronically signed] 04/01/2022 08:45 AM  Chesley Mires MD, ABSM Diplomate, American Board of Sleep Medicine NPI: [id number]  [place] PH: [phone]   FX: [phone] [place]

## 2022-04-18 ENCOUNTER — Other Ambulatory Visit: Payer: Self-pay | Admitting: Neurology

## 2022-05-05 ENCOUNTER — Other Ambulatory Visit: Payer: Self-pay | Admitting: Neurology

## 2022-05-12 ENCOUNTER — Encounter (HOSPITAL_COMMUNITY): Payer: Self-pay

## 2022-05-12 ENCOUNTER — Inpatient Hospital Stay: Admit: 2022-05-12 | Payer: Medicaid Other | Admitting: Internal Medicine

## 2022-05-12 ENCOUNTER — Telehealth: Payer: Self-pay | Admitting: *Deleted

## 2022-05-12 NOTE — Telephone Encounter (Signed)
Altha Harm, RN with Palliative Care/[facility] calling . States Dr Rosita Fire saw pt and was to send in a referral for [facility] for pt. Unsure when seen by Dr. Rosita Fire.  Questioning if Home Health was ordered, states family reports they have not received help.  Advised Dr. Rosita Fire is now at [facility]. States she is trying to coordinate care for pt.  LOV at previous practice noted 07/16/21, cancelled by patient.  Please advise if able to assist with this issue.  Altha Harm, [place]

## 2022-05-14 ENCOUNTER — Ambulatory Visit: Payer: Medicaid Other | Admitting: Pulmonary Disease

## 2022-06-26 ENCOUNTER — Other Ambulatory Visit: Payer: Self-pay | Admitting: Pulmonary Disease

## 2022-06-27 ENCOUNTER — Other Ambulatory Visit: Payer: Self-pay | Admitting: Family Medicine

## 2022-06-27 NOTE — Telephone Encounter (Signed)
No longer our patient

## 2022-07-09 ENCOUNTER — Other Ambulatory Visit: Payer: Self-pay | Admitting: Neurology

## 2022-07-10 ENCOUNTER — Ambulatory Visit (INDEPENDENT_AMBULATORY_CARE_PROVIDER_SITE_OTHER): Payer: Medicaid Other | Admitting: Pulmonary Disease

## 2022-07-10 ENCOUNTER — Telehealth: Payer: Self-pay

## 2022-07-10 ENCOUNTER — Encounter: Payer: Self-pay | Admitting: Pulmonary Disease

## 2022-07-10 VITALS — BP 132/78 | HR 84 | Temp 97.6°F | Ht 61.0 in | Wt 176.0 lb

## 2022-07-10 DIAGNOSIS — J9611 Chronic respiratory failure with hypoxia: Secondary | ICD-10-CM

## 2022-07-10 DIAGNOSIS — J449 Chronic obstructive pulmonary disease, unspecified: Secondary | ICD-10-CM | POA: Diagnosis not present

## 2022-07-10 MED ORDER — TRELEGY ELLIPTA 100-62.5-25 MCG/ACT IN AEPB: 1.0000 | INHALATION_SPRAY | Freq: Every day | RESPIRATORY_TRACT | 5 refills | Status: DC

## 2022-07-10 MED ORDER — GUAIFENESIN ER 600 MG PO TB12: 1200.0000 mg | ORAL_TABLET | Freq: Two times a day (BID) | ORAL | 5 refills | Status: DC | PRN

## 2022-07-10 NOTE — Telephone Encounter (Signed)
PA for Quetiapine 100 mg  (Key: [id number])  Your information has been sent to [employer] [facility].

## 2022-07-10 NOTE — Patient Instructions (Signed)
Will arrange for flutter valve and trelegy.  Use mucinex, then trelegy, then flutter valve in the morning.  Use mucinex, then albuterol, then flutter valve in the evening.  Use singulair 10 mg pill at night.  Follow up in 3 months.

## 2022-07-10 NOTE — Progress Notes (Signed)
[facility]  Chief Complaint  Patient presents with   Follow-up    Breathing doing well but has not used cpap in over a month. She states that she feels like it is suffocating her when she wears it     Past Surgical History:  She  has a past surgical history that includes Tubal ligation (2001); Knee arthroscopy (1990's); Carpal tunnel release (~ 2008); Shoulder open rotator cuff repair (~ 2010); Abdominal hysterectomy (2001); Total knee arthroplasty (08/23/2012); Total knee arthroplasty (08/23/2012); Cataract extraction (Bilateral); Colonoscopy with esophagogastroduodenoscopy (egd); Nasal septoplasty w/ turbinoplasty (Bilateral, 05/23/2016); Sinus endo with fusion (Right, 05/23/2016); Colonoscopy; Upper gastrointestinal endoscopy; Esophagogastroduodenoscopy (egd) with propofol (N/A, 04/16/2021); biopsy (04/16/2021); and Sinoscopy.  Past Medical History:  OA, COVID 29 March 2021, Diverticulosis, Epilepsy, GERD, Glaucoma, Rt leg DVT, HLD, HTN, IBS, Migraine headache, PNA, DM type 2  Constitutional:  BP 132/78   Pulse 84   Temp 97.6 F (36.4 C)   Ht '5\' 1"'$  (1.549 m)   Wt 176 lb (79.8 kg)   SpO2 98% Comment: 3LO2 cont  BMI 33.25 kg/m   Brief Summary:  [name] is a 66 y.o. female smoker with COPD, obstructive sleep apnea, and chronic respiratory failure.      Subjective:   She has more cough with chest congestion.  Bringing up clear sputum.  Using 3 liters oxygen.  Feels like her breathing is worse with NIV.  She hasn't been using breztri.  Says she has dulera but this doesn't help as much.  She was in hospital at [facility] in October and treated for pneumonia.  She had a hematoma and xarelto was held.     Physical Exam:   Appearance - well kempt, in motorized scooter wearing oxygen  ENMT - no sinus tenderness, no oral exudate, no LAN, Mallampati 2 airway, no stridor  Respiratory - bilateral rhonchi  CV - s1s2 regular rate and rhythm,  no murmurs  Ext - no clubbing, no edema  Skin - no rashes  Psych - normal mood and affect     Pulmonary testing:  Spirometry 10/27/13 >> FEV1 1.51 (66%), FEV1% 68 PFT 09/27/21 >> FEV1 1.05 (47%), FEV1% 58. TLC 3.51 (76%), DLCO 52% ABT 04/20/22 >> pH 7.32, PCO2 51.1, PO2 92  Chest Imaging:  HRCT chest 11/07/15 >> upper lobe scarring CT chest 05/11/21 >> centrilobular and paraseptal emphysema CT chest 05/12/22 >> moderate emphysema, apical scarring, patchy opacities  Sleep Tests:  ONO with CPAP and 2 liters 09/04/21 >> test time 6 hrs 33 min.  Baseline SpO2 94%, low SpO2 87%.  Spent 12 sec with SpO2 < 88%.  Cardiac Tests:  Echo 09/03/21 >> EF 60 to 65%, grade 1 DD Doppler Lt leg 12/26/21 >> DVT Lt common femoral vein  Social History:  She  reports that she has been smoking cigarettes. She started smoking about 46 years ago. She has a 40.00 pack-year smoking history. She has never used smokeless tobacco. She reports that she does not drink alcohol and does not use drugs.  Family History:  Her family history includes Alcohol abuse in her father; Bipolar disorder in her son; Breast cancer in her maternal aunt; Diabetes in her brother, maternal aunt, maternal grandfather, maternal uncle, and mother; Emphysema in her father; Esophageal cancer in her maternal uncle; Heart disease in her maternal grandfather, sister, son, and another family member; Lung cancer in her father and mother; Muscular dystrophy in her son; Other in her sister;  Seizures in her brother; Stroke in her mother.     Assessment/Plan:   COPD with chronic bronchitis and emphysema. - she has multiple Abx allergies; don't think she needs ABx at present - will switch from dulera to trelegy 100 one puff daily - prn albuterol - will arrange for flutter valve and mucinex - continue singulair  Allergic rhinitis. - continue flonase, loratadine - add astepro and singulair  Obstructive sleep apnea. - reports compliance with  therapy and benefit from NIV - she uses [employer] for her DME - discussed strategies to improve compliance with [employer]  Chronic respiratory failure with hypoxia/hypercapnia. - she uses 3 liters 24/7 - gets supplies from [employer]   Time Spent Involved in Patient Care on Day of Examination:  36 minutes  Follow up:   Patient Instructions  Will arrange for flutter valve and trelegy.  Use mucinex, then trelegy, then flutter valve in the morning.  Use mucinex, then albuterol, then flutter valve in the evening.  Use singulair 10 mg pill at night.  Follow up in 3 months.  Medication List:   Allergies as of 07/10/2022       Reactions   Amoxicillin Anaphylaxis   Breakout, mouth swells   Azithromycin Swelling, Rash   "swelling all over, hives, thrush"   Cefdinir    Throat swelling after 3rd dose   Chantix [varenicline] Other (See Comments)   "bad dreams, difficulty breathing"   Doxycycline Hyclate Anaphylaxis, Rash   swelling   Levofloxacin Anaphylaxis   Penicillins Anaphylaxis, Shortness Of Breath, Rash   "ALMOST DIED, ended up in hospital"  PCN reaction causing immediate rash, facial/tongue/throat swelling, SOB or lightheadedness with hypotension: YES PCN reaction causing severe rash involving mucus membranes or skin necrosis: NO PCN reaction that required hospitalization YES Has patient had a PCN reaction occurring within the last 10 years: NO   Sulfonamide Derivatives Shortness Of Breath, Swelling, Rash   "break out from head to toe"   Toradol [ketorolac Tromethamine] Other (See Comments)   Seizure   Clarithromycin Hives   swelling   Nortriptyline Swelling, Rash, Other (See Comments)   Per patient had "kidney problems" on this medication, could not use bathroom (urinary retention)   Paxlovid [nirmatrelvir-ritonavir] Swelling   Throat swelling   Codeine Nausea And Vomiting   Fluticasone-salmeterol Other (See Comments)   REACTION: ulcers in mouth   Triptans Rash   Mouth  breaks out and start itching        Medication List        Accurate as of July 10, 2022 12:19 PM. If you have any questions, ask your nurse or doctor.          STOP taking these medications    Breztri Aerosphere 160-9-4.8 MCG/ACT Aero Generic drug: Budeson-Glycopyrrol-Formoterol Stopped by: Chesley Mires, MD       TAKE these medications    albuterol 108 (90 Base) MCG/ACT inhaler Commonly known as: ProAir HFA INHALE TWO PUFFS INTO THE LUNGS EVERY 6 HOURS AS NEEDED FOR SHORTNESS OF BREATH   albuterol (2.5 MG/3ML) 0.083% nebulizer solution Commonly known as: PROVENTIL USE 1 VIAL IN NEBULIZER EVERY 6 HOURS AS NEEDED FOR SHORTNESS OF BREATH   Allergy Relief 10 MG tablet Generic drug: loratadine TAKE ONE TABLET BY MOUTH DAILY   aspirin EC 81 MG tablet Take 81 mg by mouth daily. Swallow whole.   Azelastine HCl 0.15 % Soln Commonly known as: Astepro Place 1 spray into the nose 2 (two) times daily as needed (Allergies).  EX-LAX PO Take by mouth.   ferrous sulfate 324 (65 Fe) MG Tbec TAKE ONE TABLET BY MOUTH EVERY OTHER DAY   fluticasone 50 MCG/ACT nasal spray Commonly known as: FLONASE Place 1 spray into both nostrils daily.   gabapentin 100 MG capsule Commonly known as: NEURONTIN Take 1 capsule (100 mg total) by mouth at bedtime. What changed: when to take this   guaiFENesin 600 MG 12 hr tablet Commonly known as: Mucinex Take 2 tablets (1,200 mg total) by mouth 2 (two) times daily as needed. Started by: Chesley Mires, MD   lamoTRIgine 200 MG tablet Commonly known as: LAMICTAL TAKE 1 AND 1/2 TABLETS BY MOUTH TWICE DAILY   montelukast 10 MG tablet Commonly known as: SINGULAIR TAKE ONE TABLET BY MOUTH AT BEDTIME   OXYGEN Inhale 3 L into the lungs continuous.   pantoprazole 40 MG tablet Commonly known as: PROTONIX Take 1 tablet (40 mg total) by mouth 2 (two) times daily. **PLEASE CALL OFFICE TO SCHEDULE FOLLOW UP   QUEtiapine 100 MG  tablet Commonly known as: SEROQUEL TAKE ONE TABLET BY MOUTH AT BEDTIME   simvastatin 40 MG tablet Commonly known as: ZOCOR TAKE ONE TABLET BY MOUTH AT BEDTIME   tiZANidine 4 MG tablet Commonly known as: ZANAFLEX TAKE ONE TABLET BY MOUTH EVERY 6 HOURS AS NEEDED FOR MUSCLE SPASMS FOR UP TO 30 DOSES   topiramate 100 MG tablet Commonly known as: TOPAMAX Take 200 mg twice daily   Trelegy Ellipta 100-62.5-25 MCG/ACT Aepb Generic drug: Fluticasone-Umeclidin-Vilant Inhale 1 puff into the lungs daily in the afternoon. Started by: Chesley Mires, MD   Vitamin D3 50 MCG (2000 UT) capsule TAKE ONE CAPSULE BY MOUTH DAILY        Signature:  Chesley Mires, MD [place] Pager - [phone] 07/10/2022, 12:19 PM

## 2022-07-11 ENCOUNTER — Telehealth: Payer: Self-pay

## 2022-07-11 NOTE — Telephone Encounter (Signed)
PA request received via CMM through [employer] for Trelegy Ellipta 100-62.5-25MCG/ACT aerosol powder  PA has been submitted and is awaiting determination.   Key: [id number]

## 2022-07-11 NOTE — Telephone Encounter (Signed)
PA has been APPROVED for Trelegy Ellipta from 07/11/2022-07/12/2023 through [facility].

## 2022-07-16 ENCOUNTER — Ambulatory Visit: Payer: Medicaid Other | Admitting: Nurse Practitioner

## 2022-07-16 ENCOUNTER — Other Ambulatory Visit: Payer: Self-pay | Admitting: Neurology

## 2022-08-01 ENCOUNTER — Telehealth: Payer: Self-pay | Admitting: *Deleted

## 2022-08-01 MED ORDER — PREDNISONE 10 MG PO TABS: ORAL_TABLET | ORAL | 0 refills | Status: AC

## 2022-08-01 NOTE — Telephone Encounter (Signed)
Primary Pulmonologist: Dr. Halford Chessman  Last office visit and with whom: 07/10/2022 Dr. Halford Chessman What do we see them for (pulmonary problems): COPD mixed type  Last OV assessment/plan: see below   Was appointment offered to patient (explain)?  No availability in [facility] office or [place] before weekend   Reason for call: Patient called and states that for the last 2 weeks she has had a cough with yellow and green sputum, increased SOB and weakness. She denies fever, wheezing. Has taken a covid test and this was negative. Has not taken a flu test.   Would like to know if she can have an rx of prednisone sent to [facility].   Dr. Halford Chessman is out of office today so sending to [facility], please advise.     Allergies  Allergen Reactions   Amoxicillin Anaphylaxis    Breakout, mouth swells   Azithromycin Swelling and Rash    "swelling all over, hives, thrush"   Cefdinir     Throat swelling after 3rd dose   Chantix [Varenicline] Other (See Comments)    "bad dreams, difficulty breathing"   Doxycycline Hyclate Anaphylaxis and Rash    swelling   Levofloxacin Anaphylaxis   Penicillins Anaphylaxis, Shortness Of Breath and Rash    "ALMOST DIED, ended up in hospital"  PCN reaction causing immediate rash, facial/tongue/throat swelling, SOB or lightheadedness with hypotension: YES PCN reaction causing severe rash involving mucus membranes or skin necrosis: NO PCN reaction that required hospitalization YES Has patient had a PCN reaction occurring within the last 10 years: NO    Sulfonamide Derivatives Shortness Of Breath, Swelling and Rash    "break out from head to toe"   Toradol [Ketorolac Tromethamine] Other (See Comments)    Seizure    Clarithromycin Hives    swelling   Nortriptyline Swelling, Rash and Other (See Comments)    Per patient had "kidney problems" on this medication, could not use bathroom (urinary retention)   Paxlovid [Nirmatrelvir-Ritonavir] Swelling    Throat swelling   Codeine Nausea And Vomiting    Fluticasone-Salmeterol Other (See Comments)    REACTION: ulcers in mouth   Triptans Rash    Mouth breaks out and start itching    Immunization History  Administered Date(s) Administered   Influenza Split 05/22/2011, 05/05/2012   Influenza Whole 05/27/2007, 05/01/2008, 05/22/2009, 05/23/2010   Influenza,inj,Quad PF,6+ Mos 05/10/2013, 04/19/2014, 06/25/2015, 04/15/2016, 05/12/2017, 05/11/2019, 05/16/2020, 05/15/2021   PNEUMOCOCCAL CONJUGATE-20 07/02/2021   Pneumococcal Polysaccharide-23 08/12/1999, 04/19/2014, 06/11/2021   Td 12/09/2001   Tdap 01/05/2013     Assessment/Plan:    COPD with chronic bronchitis and emphysema. - she has multiple Abx allergies; don't think she needs ABx at present - will switch from dulera to trelegy 100 one puff daily - prn albuterol - will arrange for flutter valve and mucinex - continue singulair   Allergic rhinitis. - continue flonase, loratadine - add astepro and singulair   Obstructive sleep apnea. - reports compliance with therapy and benefit from NIV - she uses [employer] for her DME - discussed strategies to improve compliance with [employer]   Chronic respiratory failure with hypoxia/hypercapnia. - she uses 3 liters 24/7 - gets supplies from [employer]     Time Spent Involved in Patient Care on Day of Examination:  36 minutes   Follow up:    Patient Instructions  Will arrange for flutter valve and trelegy.   Use mucinex, then trelegy, then flutter valve in the morning.   Use mucinex, then albuterol, then flutter valve in  the evening.   Use singulair 10 mg pill at night.   Follow up in 3 months.   Medication List:    Allergies as of 07/10/2022         Reactions    Amoxicillin Anaphylaxis    Breakout, mouth swells    Azithromycin Swelling, Rash    "swelling all over, hives, thrush"    Cefdinir      Throat swelling after 3rd dose    Chantix [varenicline] Other (See Comments)    "bad dreams, difficulty breathing"    Doxycycline  Hyclate Anaphylaxis, Rash    swelling    Levofloxacin Anaphylaxis    Penicillins Anaphylaxis, Shortness Of Breath, Rash    "ALMOST DIED, ended up in hospital"  PCN reaction causing immediate rash, facial/tongue/throat swelling, SOB or lightheadedness with hypotension: YES PCN reaction causing severe rash involving mucus membranes or skin necrosis: NO PCN reaction that required hospitalization YES Has patient had a PCN reaction occurring within the last 10 years: NO    Sulfonamide Derivatives Shortness Of Breath, Swelling, Rash    "break out from head to toe"    Toradol [ketorolac Tromethamine] Other (See Comments)    Seizure    Clarithromycin Hives    swelling    Nortriptyline Swelling, Rash, Other (See Comments)    Per patient had "kidney problems" on this medication, could not use bathroom (urinary retention)    Paxlovid [nirmatrelvir-ritonavir] Swelling    Throat swelling    Codeine Nausea And Vomiting    Fluticasone-salmeterol Other (See Comments)    REACTION: ulcers in mouth    Triptans Rash    Mouth breaks out and start itching            Medication List           Accurate as of July 10, 2022 12:19 PM. If you have any questions, ask your nurse or doctor.              STOP taking these medications     Breztri Aerosphere 160-9-4.8 MCG/ACT Aero Generic drug: Budeson-Glycopyrrol-Formoterol Stopped by: Chesley Mires, MD           TAKE these medications     albuterol 108 (90 Base) MCG/ACT inhaler Commonly known as: ProAir HFA INHALE TWO PUFFS INTO THE LUNGS EVERY 6 HOURS AS NEEDED FOR SHORTNESS OF BREATH    albuterol (2.5 MG/3ML) 0.083% nebulizer solution Commonly known as: PROVENTIL USE 1 VIAL IN NEBULIZER EVERY 6 HOURS AS NEEDED FOR SHORTNESS OF BREATH    Allergy Relief 10 MG tablet Generic drug: loratadine TAKE ONE TABLET BY MOUTH DAILY    aspirin EC 81 MG tablet Take 81 mg by mouth daily. Swallow whole.    Azelastine HCl 0.15 % Soln Commonly  known as: Astepro Place 1 spray into the nose 2 (two) times daily as needed (Allergies).    EX-LAX PO Take by mouth.    ferrous sulfate 324 (65 Fe) MG Tbec TAKE ONE TABLET BY MOUTH EVERY OTHER DAY    fluticasone 50 MCG/ACT nasal spray Commonly known as: FLONASE Place 1 spray into both nostrils daily.    gabapentin 100 MG capsule Commonly known as: NEURONTIN Take 1 capsule (100 mg total) by mouth at bedtime. What changed: when to take this    guaiFENesin 600 MG 12 hr tablet Commonly known as: Mucinex Take 2 tablets (1,200 mg total) by mouth 2 (two) times daily as needed. Started by: Chesley Mires, MD    lamoTRIgine 200 MG tablet  Commonly known as: LAMICTAL TAKE 1 AND 1/2 TABLETS BY MOUTH TWICE DAILY    montelukast 10 MG tablet Commonly known as: SINGULAIR TAKE ONE TABLET BY MOUTH AT BEDTIME    OXYGEN Inhale 3 L into the lungs continuous.    pantoprazole 40 MG tablet Commonly known as: PROTONIX Take 1 tablet (40 mg total) by mouth 2 (two) times daily. **PLEASE CALL OFFICE TO SCHEDULE FOLLOW UP    QUEtiapine 100 MG tablet Commonly known as: SEROQUEL TAKE ONE TABLET BY MOUTH AT BEDTIME    simvastatin 40 MG tablet Commonly known as: ZOCOR TAKE ONE TABLET BY MOUTH AT BEDTIME    tiZANidine 4 MG tablet Commonly known as: ZANAFLEX TAKE ONE TABLET BY MOUTH EVERY 6 HOURS AS NEEDED FOR MUSCLE SPASMS FOR UP TO 30 DOSES    topiramate 100 MG tablet Commonly known as: TOPAMAX Take 200 mg twice daily    Trelegy Ellipta 100-62.5-25 MCG/ACT Aepb Generic drug: Fluticasone-Umeclidin-Vilant Inhale 1 puff into the lungs daily in the afternoon. Started by: Chesley Mires, MD    Vitamin D3 50 MCG (2000 UT) capsule TAKE ONE CAPSULE BY MOUTH DAILY             Signature:  Chesley Mires, MD [place] Pager - [phone] 07/10/2022, 12:19 PM

## 2022-08-01 NOTE — Telephone Encounter (Signed)
Called and spoke with patient. She verbalized understanding. RX has been sent to pharmacy.  ° °Nothing further needed at time of call.  °

## 2022-08-01 NOTE — Telephone Encounter (Signed)
Sounds like she probably has a bronchitis, would treat with antibiotics but she is allergic to most options.  We can defer antibiotics and try a brief prednisone taper.  She needs to call us back if she still having symptoms, may need to be seen in office  Prednisone Take '30mg'$  daily for 3 days, then '20mg'$  daily for 3 days, then '10mg'$  daily for 3 days, then stop

## 2022-08-05 ENCOUNTER — Emergency Department (HOSPITAL_COMMUNITY): Payer: Medicaid Other

## 2022-08-05 ENCOUNTER — Inpatient Hospital Stay (HOSPITAL_COMMUNITY)
Admission: EM | Admit: 2022-08-05 | Discharge: 2022-08-08 | DRG: 193 | Disposition: A | Discharge status: Home Health | Payer: Medicaid Other | Attending: Internal Medicine | Admitting: Internal Medicine

## 2022-08-05 ENCOUNTER — Encounter (HOSPITAL_COMMUNITY): Payer: Self-pay

## 2022-08-05 ENCOUNTER — Other Ambulatory Visit: Payer: Self-pay

## 2022-08-05 DIAGNOSIS — Z7901 Long term (current) use of anticoagulants: Secondary | ICD-10-CM

## 2022-08-05 DIAGNOSIS — Z888 Allergy status to other drugs, medicaments and biological substances status: Secondary | ICD-10-CM

## 2022-08-05 DIAGNOSIS — J441 Chronic obstructive pulmonary disease with (acute) exacerbation: Secondary | ICD-10-CM | POA: Diagnosis present

## 2022-08-05 DIAGNOSIS — Z79899 Other long term (current) drug therapy: Secondary | ICD-10-CM

## 2022-08-05 DIAGNOSIS — E785 Hyperlipidemia, unspecified: Secondary | ICD-10-CM | POA: Diagnosis present

## 2022-08-05 DIAGNOSIS — Z801 Family history of malignant neoplasm of trachea, bronchus and lung: Secondary | ICD-10-CM

## 2022-08-05 DIAGNOSIS — I10 Essential (primary) hypertension: Secondary | ICD-10-CM | POA: Diagnosis present

## 2022-08-05 DIAGNOSIS — Z86718 Personal history of other venous thrombosis and embolism: Secondary | ICD-10-CM | POA: Diagnosis not present

## 2022-08-05 DIAGNOSIS — J449 Chronic obstructive pulmonary disease, unspecified: Secondary | ICD-10-CM | POA: Diagnosis not present

## 2022-08-05 DIAGNOSIS — K219 Gastro-esophageal reflux disease without esophagitis: Secondary | ICD-10-CM | POA: Diagnosis present

## 2022-08-05 DIAGNOSIS — G9341 Metabolic encephalopathy: Secondary | ICD-10-CM | POA: Diagnosis present

## 2022-08-05 DIAGNOSIS — E782 Mixed hyperlipidemia: Secondary | ICD-10-CM | POA: Diagnosis present

## 2022-08-05 DIAGNOSIS — G4710 Hypersomnia, unspecified: Secondary | ICD-10-CM | POA: Diagnosis present

## 2022-08-05 DIAGNOSIS — Z833 Family history of diabetes mellitus: Secondary | ICD-10-CM | POA: Diagnosis not present

## 2022-08-05 DIAGNOSIS — J9621 Acute and chronic respiratory failure with hypoxia: Secondary | ICD-10-CM | POA: Diagnosis present

## 2022-08-05 DIAGNOSIS — J9601 Acute respiratory failure with hypoxia: Secondary | ICD-10-CM | POA: Diagnosis not present

## 2022-08-05 DIAGNOSIS — Z8616 Personal history of COVID-19: Secondary | ICD-10-CM | POA: Diagnosis not present

## 2022-08-05 DIAGNOSIS — E876 Hypokalemia: Secondary | ICD-10-CM | POA: Insufficient documentation

## 2022-08-05 DIAGNOSIS — Z85828 Personal history of other malignant neoplasm of skin: Secondary | ICD-10-CM | POA: Diagnosis not present

## 2022-08-05 DIAGNOSIS — Z811 Family history of alcohol abuse and dependence: Secondary | ICD-10-CM

## 2022-08-05 DIAGNOSIS — Z7982 Long term (current) use of aspirin: Secondary | ICD-10-CM

## 2022-08-05 DIAGNOSIS — F17200 Nicotine dependence, unspecified, uncomplicated: Secondary | ICD-10-CM | POA: Diagnosis not present

## 2022-08-05 DIAGNOSIS — Z9981 Dependence on supplemental oxygen: Secondary | ICD-10-CM

## 2022-08-05 DIAGNOSIS — Z889 Allergy status to unspecified drugs, medicaments and biological substances status: Secondary | ICD-10-CM

## 2022-08-05 DIAGNOSIS — G40909 Epilepsy, unspecified, not intractable, without status epilepticus: Secondary | ICD-10-CM

## 2022-08-05 DIAGNOSIS — E669 Obesity, unspecified: Secondary | ICD-10-CM | POA: Diagnosis present

## 2022-08-05 DIAGNOSIS — F32A Depression, unspecified: Secondary | ICD-10-CM | POA: Diagnosis present

## 2022-08-05 DIAGNOSIS — Z8249 Family history of ischemic heart disease and other diseases of the circulatory system: Secondary | ICD-10-CM

## 2022-08-05 DIAGNOSIS — Z880 Allergy status to penicillin: Secondary | ICD-10-CM

## 2022-08-05 DIAGNOSIS — Z90711 Acquired absence of uterus with remaining cervical stump: Secondary | ICD-10-CM

## 2022-08-05 DIAGNOSIS — J189 Pneumonia, unspecified organism: Secondary | ICD-10-CM | POA: Diagnosis present

## 2022-08-05 DIAGNOSIS — Z881 Allergy status to other antibiotic agents status: Secondary | ICD-10-CM

## 2022-08-05 DIAGNOSIS — J440 Chronic obstructive pulmonary disease with acute lower respiratory infection: Secondary | ICD-10-CM | POA: Diagnosis present

## 2022-08-05 DIAGNOSIS — Z803 Family history of malignant neoplasm of breast: Secondary | ICD-10-CM

## 2022-08-05 DIAGNOSIS — Z6833 Body mass index (BMI) 33.0-33.9, adult: Secondary | ICD-10-CM | POA: Diagnosis not present

## 2022-08-05 DIAGNOSIS — F17210 Nicotine dependence, cigarettes, uncomplicated: Secondary | ICD-10-CM | POA: Diagnosis present

## 2022-08-05 DIAGNOSIS — Z825 Family history of asthma and other chronic lower respiratory diseases: Secondary | ICD-10-CM | POA: Diagnosis not present

## 2022-08-05 DIAGNOSIS — G4733 Obstructive sleep apnea (adult) (pediatric): Secondary | ICD-10-CM | POA: Diagnosis present

## 2022-08-05 DIAGNOSIS — Z832 Family history of diseases of the blood and blood-forming organs and certain disorders involving the immune mechanism: Secondary | ICD-10-CM

## 2022-08-05 DIAGNOSIS — J439 Emphysema, unspecified: Secondary | ICD-10-CM | POA: Diagnosis present

## 2022-08-05 DIAGNOSIS — E119 Type 2 diabetes mellitus without complications: Secondary | ICD-10-CM | POA: Diagnosis present

## 2022-08-05 DIAGNOSIS — Z800 Family history of malignant neoplasm of digestive organs: Secondary | ICD-10-CM | POA: Diagnosis not present

## 2022-08-05 DIAGNOSIS — Z96651 Presence of right artificial knee joint: Secondary | ICD-10-CM | POA: Diagnosis present

## 2022-08-05 DIAGNOSIS — Z823 Family history of stroke: Secondary | ICD-10-CM

## 2022-08-05 DIAGNOSIS — J09X1 Influenza due to identified novel influenza A virus with pneumonia: Secondary | ICD-10-CM

## 2022-08-05 DIAGNOSIS — Z993 Dependence on wheelchair: Secondary | ICD-10-CM

## 2022-08-05 LAB — CBC
HCT: 35.3 % — ABNORMAL LOW (ref 36.0–46.0)
Hemoglobin: 10.8 g/dL — ABNORMAL LOW (ref 12.0–15.0)
MCH: 30.9 pg (ref 26.0–34.0)
MCHC: 30.6 g/dL (ref 30.0–36.0)
MCV: 101.1 fL — ABNORMAL HIGH (ref 80.0–100.0)
Platelets: 246 10*3/uL (ref 150–400)
RBC: 3.49 MIL/uL — ABNORMAL LOW (ref 3.87–5.11)
RDW: 15.9 % — ABNORMAL HIGH (ref 11.5–15.5)
WBC: 16 10*3/uL — ABNORMAL HIGH (ref 4.0–10.5)
nRBC: 0 % (ref 0.0–0.2)

## 2022-08-05 LAB — BASIC METABOLIC PANEL
Anion gap: 10 (ref 5–15)
BUN: 24 mg/dL — ABNORMAL HIGH (ref 8–23)
CO2: 25 mmol/L (ref 22–32)
Calcium: 8.7 mg/dL — ABNORMAL LOW (ref 8.9–10.3)
Chloride: 106 mmol/L (ref 98–111)
Creatinine, Ser: 1.17 mg/dL — ABNORMAL HIGH (ref 0.44–1.00)
GFR, Estimated: 52 mL/min — ABNORMAL LOW (ref >60)
Glucose, Bld: 116 mg/dL — ABNORMAL HIGH (ref 70–99)
Potassium: 3.1 mmol/L — ABNORMAL LOW (ref 3.5–5.1)
Sodium: 141 mmol/L (ref 135–145)

## 2022-08-05 LAB — BLOOD GAS, ARTERIAL
Acid-Base Excess: 1.3 mmol/L (ref 0.0–2.0)
Bicarbonate: 27.9 mmol/L (ref 20.0–28.0)
Drawn by: 27407
O2 Saturation: 99.3 %
Patient temperature: 37
pCO2 arterial: 53 mmHg — ABNORMAL HIGH (ref 32–48)
pH, Arterial: 7.33 — ABNORMAL LOW (ref 7.35–7.45)
pO2, Arterial: 86 mmHg (ref 83–108)

## 2022-08-05 LAB — MAGNESIUM: Magnesium: 2.1 mg/dL (ref 1.7–2.4)

## 2022-08-05 LAB — LACTIC ACID, PLASMA: Lactic Acid, Venous: 1 mmol/L (ref 0.5–1.9)

## 2022-08-05 LAB — RESP PANEL BY RT-PCR (RSV, FLU A&B, COVID)  RVPGX2
Influenza A by PCR: NEGATIVE
Influenza B by PCR: NEGATIVE
Resp Syncytial Virus by PCR: NEGATIVE
SARS Coronavirus 2 by RT PCR: NEGATIVE

## 2022-08-05 MED ORDER — VANCOMYCIN HCL 1750 MG/350ML IV SOLN
1750.0000 mg | Freq: Once | INTRAVENOUS | Status: AC
  Administered 2022-08-05: 1750 mg via INTRAVENOUS
  Filled 2022-08-05: qty 350

## 2022-08-05 MED ORDER — POTASSIUM CHLORIDE 10 MEQ/100ML IV SOLN
10.0000 meq | Freq: Once | INTRAVENOUS | Status: AC
  Administered 2022-08-05: 10 meq via INTRAVENOUS
  Filled 2022-08-05: qty 100

## 2022-08-05 MED ORDER — SODIUM CHLORIDE 0.9 % IV SOLN
1.0000 g | Freq: Three times a day (TID) | INTRAVENOUS | Status: DC
  Administered 2022-08-05 – 2022-08-07 (×5): 1 g via INTRAVENOUS
  Filled 2022-08-05 (×11): qty 5

## 2022-08-05 MED ORDER — IPRATROPIUM-ALBUTEROL 0.5-2.5 (3) MG/3ML IN SOLN
3.0000 mL | Freq: Once | RESPIRATORY_TRACT | Status: AC
  Administered 2022-08-05: 3 mL via RESPIRATORY_TRACT
  Filled 2022-08-05: qty 3

## 2022-08-05 MED ORDER — VANCOMYCIN HCL 750 MG/150ML IV SOLN
750.0000 mg | INTRAVENOUS | Status: DC
  Administered 2022-08-06: 750 mg via INTRAVENOUS
  Filled 2022-08-05: qty 150

## 2022-08-05 MED ORDER — IOHEXOL 350 MG/ML SOLN
75.0000 mL | Freq: Once | INTRAVENOUS | Status: AC | PRN
  Administered 2022-08-05: 75 mL via INTRAVENOUS

## 2022-08-05 MED ORDER — METHYLPREDNISOLONE SODIUM SUCC 125 MG IJ SOLR
125.0000 mg | Freq: Once | INTRAMUSCULAR | Status: AC
  Administered 2022-08-05: 125 mg via INTRAVENOUS
  Filled 2022-08-05: qty 2

## 2022-08-05 MED ORDER — SODIUM CHLORIDE 0.9 % IV SOLN
INTRAVENOUS | Status: AC
  Filled 2022-08-05: qty 5

## 2022-08-05 NOTE — ED Provider Notes (Signed)
[facility] EMERGENCY DEPARTMENT Provider Note   CSN: [id number] Arrival date & time: 08/05/22  1548     History  Chief Complaint  Patient presents with   Shortness of Breath    [name] is a 66 y.o. female.  Pt is a 66 yo female with pmhx significant for COPD on 3L, HTN, HLD, GERD, IBS, epilepsy, skin cancer, OSA, and hx DVT (not currently on thinners).  Pt has been sob for the past few days.  She went to [facility] on Sunday the 24th and was told she had a UTI.  Pt is getting worse.  Today, she was more sob, so she called EMS.  EMS gave her a duoneb.  Pt has been on a prednisone taper.  O2 sat 90% on 3L, so O2 increased to 4L.       Home Medications Prior to Admission medications   Medication Sig Start Date End Date Taking? Authorizing Provider  albuterol (PROAIR HFA) 108 (90 Base) MCG/ACT inhaler INHALE TWO PUFFS INTO THE LUNGS EVERY 6 HOURS AS NEEDED FOR SHORTNESS OF BREATH 12/18/20   [name], [name], MD  albuterol (PROVENTIL) (2.5 MG/3ML) 0.083% nebulizer solution USE 1 VIAL IN NEBULIZER EVERY 6 HOURS AS NEEDED FOR SHORTNESS OF BREATH 01/18/21   [name], [name], MD  ALLERGY RELIEF 10 MG tablet TAKE ONE TABLET BY MOUTH DAILY 07/22/21   [name], [name], MD  aspirin EC 81 MG tablet Take 81 mg by mouth daily. Swallow whole.    [provider]  Azelastine HCl (ASTEPRO) 0.15 % SOLN Place 1 spray into the nose 2 (two) times daily as needed (Allergies). 01/13/22   Chesley Mires, MD  Cholecalciferol (VITAMIN D3) 50 MCG (2000 UT) capsule TAKE ONE CAPSULE BY MOUTH DAILY 03/27/21   [name], [name], MD  ferrous sulfate 324 (65 Fe) MG TBEC TAKE ONE TABLET BY MOUTH EVERY OTHER DAY 05/20/21   [name], [name], MD  fluticasone (FLONASE) 50 MCG/ACT nasal spray Place 1 spray into both nostrils daily.    [provider]  Fluticasone-Umeclidin-Vilant (TRELEGY ELLIPTA) 100-62.5-25 MCG/ACT AEPB Inhale 1 puff into the lungs daily in  the afternoon. 07/10/22   Chesley Mires, MD  gabapentin (NEURONTIN) 100 MG capsule Take 1 capsule (100 mg total) by mouth at bedtime. Patient taking differently: Take 100 mg by mouth 3 (three) times daily. 11/12/20   [name], [name], MD  guaiFENesin (MUCINEX) 600 MG 12 hr tablet Take 2 tablets (1,200 mg total) by mouth 2 (two) times daily as needed. 07/10/22   Chesley Mires, MD  lamoTRIgine (LAMICTAL) 200 MG tablet TAKE 1 AND 1/2 TABLETS BY MOUTH TWICE DAILY 07/22/22   Alric Ran, MD  montelukast (SINGULAIR) 10 MG tablet TAKE ONE TABLET BY MOUTH AT BEDTIME 06/27/22   Chesley Mires, MD  OXYGEN Inhale 3 L into the lungs continuous.    [provider]  pantoprazole (PROTONIX) 40 MG tablet Take 1 tablet (40 mg total) by mouth 2 (two) times daily. **PLEASE CALL OFFICE TO SCHEDULE FOLLOW UP 01/29/22   [name], PA  predniSONE (DELTASONE) 10 MG tablet Take 3 tablets (30 mg total) by mouth daily with breakfast for 3 days, THEN 2 tablets (20 mg total) daily with breakfast for 3 days, THEN 1 tablet (10 mg total) daily with breakfast for 3 days. 08/01/22 08/10/22  Collene Gobble, MD  QUEtiapine (SEROQUEL) 100 MG tablet TAKE ONE TABLET BY MOUTH AT BEDTIME 04/22/22   Alric Ran, MD  Sennosides (EX-LAX PO) Take by mouth.  [provider]  simvastatin (ZOCOR) 40 MG tablet TAKE ONE TABLET BY MOUTH AT BEDTIME 03/18/21   [name], [name], MD  tiZANidine (ZANAFLEX) 4 MG tablet TAKE ONE TABLET BY MOUTH EVERY 6 HOURS AS NEEDED FOR MUSCLE SPASMS FOR UP TO 30 DOSES 07/10/22   Alric Ran, MD  topiramate (TOPAMAX) 100 MG tablet Take 200 mg twice daily 07/24/21   [name], NP      Allergies    Amoxicillin, Azithromycin, Cefdinir, Chantix [varenicline], Doxycycline hyclate, Levofloxacin, Penicillins, Sulfonamide derivatives, Toradol [ketorolac tromethamine], Clarithromycin, Nortriptyline, Paxlovid [nirmatrelvir-ritonavir], Codeine, Fluticasone-salmeterol, and  Triptans    Review of Systems   Review of Systems  Respiratory:  Positive for cough, shortness of breath and wheezing.   All other systems reviewed and are negative.   Physical Exam Updated Vital Signs BP 116/69 (BP Location: Right Arm)   Pulse 87   Temp 98.5 F (36.9 C) (Oral)   Resp 18   Ht '5\' 1"'$  (1.549 m)   Wt 78.5 kg   SpO2 98%   BMI 32.69 kg/m  Physical Exam Vitals and nursing note reviewed.  Constitutional:      Appearance: She is well-developed.  HENT:     Head: Normocephalic and atraumatic.  Eyes:     Extraocular Movements: Extraocular movements intact.     Pupils: Pupils are equal, round, and reactive to light.  Cardiovascular:     Rate and Rhythm: Normal rate and regular rhythm.  Pulmonary:     Effort: Tachypnea present.     Breath sounds: Wheezing present.  Musculoskeletal:        General: Normal range of motion.  Skin:    General: Skin is warm.     Capillary Refill: Capillary refill takes less than 2 seconds.  Neurological:     General: No focal deficit present.     Mental Status: She is alert.     Comments: Pt is somnolent, but will awaken to painful stimuli; she is following commands.  Psychiatric:        Behavior: Behavior is slowed.     ED Results / Procedures / Treatments   Labs (all labs ordered are listed, but only abnormal results are displayed) Labs Reviewed  BASIC METABOLIC PANEL - Abnormal; Notable for the following components:      Result Value   Potassium 3.1 (*)    Glucose, Bld 116 (*)    BUN 24 (*)    Creatinine, Ser 1.17 (*)    Calcium 8.7 (*)    GFR, Estimated 52 (*)    All other components within normal limits  CBC - Abnormal; Notable for the following components:   WBC 16.0 (*)    RBC 3.49 (*)    Hemoglobin 10.8 (*)    HCT 35.3 (*)    MCV 101.1 (*)    RDW 15.9 (*)    All other components within normal limits  BLOOD GAS, ARTERIAL - Abnormal; Notable for the following components:   pH, Arterial 7.33 (*)    pCO2  arterial 53 (*)    All other components within normal limits  RESP PANEL BY RT-PCR (RSV, FLU A&B, COVID)  RVPGX2  CULTURE, BLOOD (ROUTINE X 2)  CULTURE, BLOOD (ROUTINE X 2)  LACTIC ACID, PLASMA  MAGNESIUM  URINALYSIS, ROUTINE W REFLEX MICROSCOPIC  CREATININE, SERUM    EKG None  Radiology CT Angio Chest PE W and/or Wo Contrast  Result Date: 08/05/2022 CLINICAL DATA:  Worsening shortness of breath for several days EXAM:  CT ANGIOGRAPHY CHEST WITH CONTRAST TECHNIQUE: Multidetector CT imaging of the chest was performed using the standard protocol during bolus administration of intravenous contrast. Multiplanar CT image reconstructions and MIPs were obtained to evaluate the vascular anatomy. RADIATION DOSE REDUCTION: This exam was performed according to the departmental dose-optimization program which includes automated exposure control, adjustment of the mA and/or kV according to patient size and/or use of iterative reconstruction technique. CONTRAST:  74m OMNIPAQUE IOHEXOL 350 MG/ML SOLN COMPARISON:  Chest x-ray from earlier in the same day, CT from 05/12/2022. FINDINGS: Cardiovascular: Thoracic aorta demonstrates mild atherosclerotic calcifications. No aneurysmal dilatation or dissection is noted. No cardiac enlargement is seen. The pulmonary artery shows a normal branching pattern without intraluminal filling defect to suggest pulmonary embolism. No cardiac enlargement is seen. No significant coronary calcifications noted. Mediastinum/Nodes: Thoracic inlet is within normal limits. Small likely reactive lymph nodes are noted particularly in the hila bilaterally. The esophagus is within normal limits. Lungs/Pleura: Emphysematous changes are noted. Patchy subpleural airspace opacities are noted slightly progressed when compare with the prior exam. No sizable effusion is seen. No parenchymal nodules are noted. Upper Abdomen: Visualized upper abdomen is within normal limits. Musculoskeletal: No acute rib  abnormality is noted. Degenerative changes of the thoracic spine are seen. Review of the MIP images confirms the above findings. IMPRESSION: Persistent and slightly increased bilateral airspace opacities favoring multifocal infection as previously described No evidence of pulmonary emboli. No other focal abnormality is noted. Aortic Atherosclerosis (ICD10-I70.0) and Emphysema (ICD10-J43.9). Electronically Signed   By: MInez CatalinaM.D.   On: 08/05/2022 22:56   DG Chest 2 View  Result Date: 08/05/2022 CLINICAL DATA:  Shortness of breath. EXAM: CHEST - 2 VIEW COMPARISON:  August 03, 2022.  May 12, 2022. FINDINGS: The heart size and mediastinal contours are within normal limits. Mildly increased reticular and patchy opacities are noted concerning for worsening atypical inflammation. The visualized skeletal structures are unremarkable. IMPRESSION: Mildly increased reticular and patchy lung opacities are noted as described above. Electronically Signed   By: JMarijo ConceptionM.D.   On: 08/05/2022 16:38    Procedures Procedures    Medications Ordered in ED Medications  aztreonam (AZACTAM) 1 g in sodium chloride 0.9 % 100 mL IVPB (1 g Intravenous New Bag/Given 08/05/22 2257)  vancomycin (VANCOREADY) IVPB 750 mg/150 mL (has no administration in time range)  potassium chloride 10 mEq in 100 mL IVPB (10 mEq Intravenous New Bag/Given 08/05/22 2318)  ipratropium-albuterol (DUONEB) 0.5-2.5 (3) MG/3ML nebulizer solution 3 mL (3 mLs Nebulization Given 08/05/22 2030)  vancomycin (VANCOREADY) IVPB 1750 mg/350 mL (0 mg Intravenous Stopped 08/05/22 2314)  methylPREDNISolone sodium succinate (SOLU-MEDROL) 125 mg/2 mL injection 125 mg (125 mg Intravenous Given 08/05/22 2257)  iohexol (OMNIPAQUE) 350 MG/ML injection 75 mL (75 mLs Intravenous Contrast Given 08/05/22 2242)    ED Course/ Medical Decision Making/ A&P                           Medical Decision Making Amount and/or Complexity of Data  Reviewed Labs: ordered. Radiology: ordered.  Risk Prescription drug management. Decision regarding hospitalization.   This patient presents to the ED for concern of sob, this involves an extensive number of treatment options, and is a complaint that carries with it a high risk of complications and morbidity.  The differential diagnosis includes copd exac, pna, covid/flu/rsv, bronchitis   Co morbidities that complicate the patient evaluation  COPD on 3L, HTN, HLD, GERD,  IBS, epilepsy, skin cancer, OSA, and hx DVT (not currently on thinners)   Additional history obtained:  Additional history obtained from epic chart review External records from outside source obtained and reviewed including EMS report   Lab Tests:  I Ordered, and personally interpreted labs.  The pertinent results include:  cbc with wbc elevated at 16, hgb 10.8 (hgb 10.7 8 mo ago); abg with pH 7.33and pCO2 at 53; k is low at 3.1; bun 24 and cr 1.17 (bun 17 and cr 1.05 in April); covid/flu/rsv neg   Imaging Studies ordered:  I ordered imaging studies including cxr and ct chest  I independently visualized and interpreted imaging which showed  CXR: Mildly increased reticular and patchy lung opacities are noted as  described above.  CT chest: Persistent and slightly increased bilateral airspace opacities  favoring multifocal infection as previously described    No evidence of pulmonary emboli.    No other focal abnormality is noted.    Aortic Atherosclerosis (ICD10-I70.0) and Emphysema (ICD10-J43.9).   I agree with the radiologist interpretation   Cardiac Monitoring:  The patient was maintained on a cardiac monitor.  I personally viewed and interpreted the cardiac monitored which showed an underlying rhythm of: nsr   Medicines ordered and prescription drug management:  I ordered medication including duoneb and solumedrol  for copd  Reevaluation of the patient after these medicines showed that the  patient improved I have reviewed the patients home medicines and have made adjustments as needed   Test Considered:  ct   Critical Interventions:  Oxygen/nebs/abx   Consultations Obtained:  I requested consultation with the hospitalist (Dr. Clearence Ped),  and discussed lab and imaging findings as well as pertinent plan - she will admit   Problem List / ED Course:  COPD exac:  pt is requiring more oxygen.  She is more somnolent, but pCO2 is ok Multifocal pna:  abx choice d/w pharmacy.  As she has multiple allergies, pt will get aztreonam and vancomycin   Reevaluation:  After the interventions noted above, I reevaluated the patient and found that they have :improved   Social Determinants of Health:  Lives at home   Dispostion:  After consideration of the diagnostic results and the patients response to treatment, I feel that the patent would benefit from admission.    CRITICAL CARE Performed by: Isla Pence   Total critical care time: 30 minutes  Critical care time was exclusive of separately billable procedures and treating other patients.  Critical care was necessary to treat or prevent imminent or life-threatening deterioration.  Critical care was time spent personally by me on the following activities: development of treatment plan with patient and/or surrogate as well as nursing, discussions with consultants, evaluation of patient's response to treatment, examination of patient, obtaining history from patient or surrogate, ordering and performing treatments and interventions, ordering and review of laboratory studies, ordering and review of radiographic studies, pulse oximetry and re-evaluation of patient's condition.         Final Clinical Impression(s) / ED Diagnoses Final diagnoses:  COPD exacerbation ([place])  Multifocal pneumonia    Rx / DC Orders ED Discharge Orders     None         Isla Pence, MD 08/05/22 2346

## 2022-08-05 NOTE — ED Triage Notes (Signed)
Pt presents to ED via [facility] for increased SOB, hx COPD. Pt was seen at [facility] on Sunday. Pt was found to have UTI. Today had increased SOB, given duoneb PTA. Pt chronically wears 3L

## 2022-08-05 NOTE — Progress Notes (Addendum)
Pharmacy Antibiotic Note  [name] is a 66 y.o. female admitted on 08/05/2022 with pneumonia.  Pharmacy has been consulted for vancomycin dosing.  Plan: -Vancomycin 1750 mg IV x1 followed by 750 mg IV q24h -Aztreonam per MD -Pharmacy to continue to follow renal function, cultures and clinical progress for dose adjustments and de-escalation as indicated  Height: '5\' 1"'$  (154.9 cm) Weight: 78.5 kg (173 lb) IBW/kg (Calculated) : 47.8  Temp (24hrs), Avg:99 F (37.2 C), Min:99 F (37.2 C), Max:99 F (37.2 C)  Recent Labs  Lab 08/05/22 1640 08/05/22 2039  WBC 16.0*  --   CREATININE 1.17*  --   LATICACIDVEN  --  1.0    Estimated Creatinine Clearance: 46.1 mL/min (A) (by C-G formula based on SCr of 1.17 mg/dL (H)).    Allergies  Allergen Reactions   Amoxicillin Anaphylaxis    Breakout, mouth swells   Azithromycin Swelling and Rash    "swelling all over, hives, thrush"   Cefdinir     Throat swelling after 3rd dose   Chantix [Varenicline] Other (See Comments)    "bad dreams, difficulty breathing"   Doxycycline Hyclate Anaphylaxis and Rash    swelling   Levofloxacin Anaphylaxis   Penicillins Anaphylaxis, Shortness Of Breath and Rash    "ALMOST DIED, ended up in hospital"  PCN reaction causing immediate rash, facial/tongue/throat swelling, SOB or lightheadedness with hypotension: YES PCN reaction causing severe rash involving mucus membranes or skin necrosis: NO PCN reaction that required hospitalization YES Has patient had a PCN reaction occurring within the last 10 years: NO    Sulfonamide Derivatives Shortness Of Breath, Swelling and Rash    "break out from head to toe"   Toradol [Ketorolac Tromethamine] Other (See Comments)    Seizure    Clarithromycin Hives    swelling   Nortriptyline Swelling, Rash and Other (See Comments)    Per patient had "kidney problems" on this medication, could not use bathroom (urinary retention)   Paxlovid [Nirmatrelvir-Ritonavir] Swelling     Throat swelling   Codeine Nausea And Vomiting   Fluticasone-Salmeterol Other (See Comments)    REACTION: ulcers in mouth   Triptans Rash    Mouth breaks out and start itching    Antimicrobials this admission: Vancomycin 12/26 >> Aztreonam 12/26 >>  Dose adjustments this admission: NA  Microbiology results: 12/26 BCx: pending   Thank you for allowing pharmacy to be a part of this patient's care.  Tawnya Crook, PharmD, BCPS Clinical Pharmacist 08/05/2022 9:45 PM

## 2022-08-06 DIAGNOSIS — G9341 Metabolic encephalopathy: Secondary | ICD-10-CM | POA: Insufficient documentation

## 2022-08-06 DIAGNOSIS — E876 Hypokalemia: Secondary | ICD-10-CM

## 2022-08-06 DIAGNOSIS — J9601 Acute respiratory failure with hypoxia: Secondary | ICD-10-CM | POA: Diagnosis not present

## 2022-08-06 DIAGNOSIS — J189 Pneumonia, unspecified organism: Secondary | ICD-10-CM | POA: Insufficient documentation

## 2022-08-06 DIAGNOSIS — F17200 Nicotine dependence, unspecified, uncomplicated: Secondary | ICD-10-CM

## 2022-08-06 DIAGNOSIS — K219 Gastro-esophageal reflux disease without esophagitis: Secondary | ICD-10-CM

## 2022-08-06 DIAGNOSIS — J449 Chronic obstructive pulmonary disease, unspecified: Secondary | ICD-10-CM

## 2022-08-06 DIAGNOSIS — E782 Mixed hyperlipidemia: Secondary | ICD-10-CM

## 2022-08-06 DIAGNOSIS — G40909 Epilepsy, unspecified, not intractable, without status epilepticus: Secondary | ICD-10-CM

## 2022-08-06 DIAGNOSIS — J09X1 Influenza due to identified novel influenza A virus with pneumonia: Secondary | ICD-10-CM

## 2022-08-06 LAB — CBC WITH DIFFERENTIAL/PLATELET
Abs Immature Granulocytes: 0.16 10*3/uL — ABNORMAL HIGH (ref 0.00–0.07)
Basophils Absolute: 0 10*3/uL (ref 0.0–0.1)
Basophils Relative: 0 %
Eosinophils Absolute: 0 10*3/uL (ref 0.0–0.5)
Eosinophils Relative: 0 %
HCT: 30.9 % — ABNORMAL LOW (ref 36.0–46.0)
Hemoglobin: 9.5 g/dL — ABNORMAL LOW (ref 12.0–15.0)
Immature Granulocytes: 1 %
Lymphocytes Relative: 6 %
Lymphs Abs: 0.8 10*3/uL (ref 0.7–4.0)
MCH: 30.9 pg (ref 26.0–34.0)
MCHC: 30.7 g/dL (ref 30.0–36.0)
MCV: 100.7 fL — ABNORMAL HIGH (ref 80.0–100.0)
Monocytes Absolute: 0.2 10*3/uL (ref 0.1–1.0)
Monocytes Relative: 1 %
Neutro Abs: 12.6 10*3/uL — ABNORMAL HIGH (ref 1.7–7.7)
Neutrophils Relative %: 92 %
Platelets: 201 10*3/uL (ref 150–400)
RBC: 3.07 MIL/uL — ABNORMAL LOW (ref 3.87–5.11)
RDW: 15.7 % — ABNORMAL HIGH (ref 11.5–15.5)
WBC: 13.8 10*3/uL — ABNORMAL HIGH (ref 4.0–10.5)
nRBC: 0 % (ref 0.0–0.2)

## 2022-08-06 LAB — COMPREHENSIVE METABOLIC PANEL
ALT: 16 U/L (ref 0–44)
AST: 14 U/L — ABNORMAL LOW (ref 15–41)
Albumin: 2.6 g/dL — ABNORMAL LOW (ref 3.5–5.0)
Alkaline Phosphatase: 93 U/L (ref 38–126)
Anion gap: 7 (ref 5–15)
BUN: 22 mg/dL (ref 8–23)
CO2: 25 mmol/L (ref 22–32)
Calcium: 8.5 mg/dL — ABNORMAL LOW (ref 8.9–10.3)
Chloride: 109 mmol/L (ref 98–111)
Creatinine, Ser: 0.89 mg/dL (ref 0.44–1.00)
GFR, Estimated: >60 mL/min (ref >60)
Glucose, Bld: 170 mg/dL — ABNORMAL HIGH (ref 70–99)
Potassium: 3.8 mmol/L (ref 3.5–5.1)
Sodium: 141 mmol/L (ref 135–145)
Total Bilirubin: 0.5 mg/dL (ref 0.3–1.2)
Total Protein: 6.8 g/dL (ref 6.5–8.1)

## 2022-08-06 LAB — URINALYSIS, ROUTINE W REFLEX MICROSCOPIC
Bacteria, UA: NONE SEEN
Bilirubin Urine: NEGATIVE
Glucose, UA: NEGATIVE mg/dL
Ketones, ur: 5 mg/dL — AB
Nitrite: NEGATIVE
Protein, ur: 30 mg/dL — AB
Specific Gravity, Urine: >1.046 — ABNORMAL HIGH (ref 1.005–1.030)
pH: 6 (ref 5.0–8.0)

## 2022-08-06 LAB — BLOOD GAS, VENOUS
Acid-Base Excess: 0.4 mmol/L (ref 0.0–2.0)
Bicarbonate: 28 mmol/L (ref 20.0–28.0)
Drawn by: 7478
O2 Saturation: 98.5 %
Patient temperature: 35.9
pCO2, Ven: 54 mmHg (ref 44–60)
pH, Ven: 7.32 (ref 7.25–7.43)
pO2, Ven: 73 mmHg — ABNORMAL HIGH (ref 32–45)

## 2022-08-06 LAB — STREP PNEUMONIAE URINARY ANTIGEN: Strep Pneumo Urinary Antigen: NEGATIVE

## 2022-08-06 LAB — MAGNESIUM: Magnesium: 2.1 mg/dL (ref 1.7–2.4)

## 2022-08-06 LAB — HIV ANTIBODY (ROUTINE TESTING W REFLEX): HIV Screen 4th Generation wRfx: NONREACTIVE

## 2022-08-06 MED ORDER — FLUTICASONE FUROATE-VILANTEROL 100-25 MCG/ACT IN AEPB
1.0000 | INHALATION_SPRAY | Freq: Every day | RESPIRATORY_TRACT | Status: DC
  Administered 2022-08-06 – 2022-08-08 (×3): 1 via RESPIRATORY_TRACT
  Filled 2022-08-06: qty 28

## 2022-08-06 MED ORDER — UMECLIDINIUM BROMIDE 62.5 MCG/ACT IN AEPB
1.0000 | INHALATION_SPRAY | Freq: Every day | RESPIRATORY_TRACT | Status: DC
  Administered 2022-08-06 – 2022-08-08 (×3): 1 via RESPIRATORY_TRACT
  Filled 2022-08-06: qty 7

## 2022-08-06 MED ORDER — ACETAMINOPHEN 325 MG PO TABS
650.0000 mg | ORAL_TABLET | Freq: Four times a day (QID) | ORAL | Status: DC | PRN
  Administered 2022-08-06: 650 mg via ORAL
  Filled 2022-08-06: qty 2

## 2022-08-06 MED ORDER — GABAPENTIN 100 MG PO CAPS
100.0000 mg | ORAL_CAPSULE | Freq: Three times a day (TID) | ORAL | Status: DC
  Administered 2022-08-06 – 2022-08-08 (×7): 100 mg via ORAL
  Filled 2022-08-06 (×7): qty 1

## 2022-08-06 MED ORDER — ONDANSETRON HCL 4 MG/2ML IJ SOLN: 4.0000 mg | Freq: Four times a day (QID) | INTRAMUSCULAR | Status: DC | PRN

## 2022-08-06 MED ORDER — ASPIRIN 81 MG PO TBEC
81.0000 mg | DELAYED_RELEASE_TABLET | Freq: Every day | ORAL | Status: DC
  Administered 2022-08-06 – 2022-08-08 (×3): 81 mg via ORAL
  Filled 2022-08-06 (×3): qty 1

## 2022-08-06 MED ORDER — SIMVASTATIN 20 MG PO TABS
40.0000 mg | ORAL_TABLET | Freq: Every day | ORAL | Status: DC
  Administered 2022-08-06 – 2022-08-07 (×3): 40 mg via ORAL
  Filled 2022-08-06: qty 2
  Filled 2022-08-06 (×2): qty 4

## 2022-08-06 MED ORDER — LAMOTRIGINE 150 MG PO TABS
300.0000 mg | ORAL_TABLET | Freq: Two times a day (BID) | ORAL | Status: DC
  Administered 2022-08-06 – 2022-08-08 (×6): 300 mg via ORAL
  Filled 2022-08-06 (×10): qty 2

## 2022-08-06 MED ORDER — GUAIFENESIN ER 600 MG PO TB12
1200.0000 mg | ORAL_TABLET | Freq: Two times a day (BID) | ORAL | Status: DC | PRN
  Filled 2022-08-06: qty 2

## 2022-08-06 MED ORDER — OSELTAMIVIR PHOSPHATE 75 MG PO CAPS: 75.0000 mg | ORAL_CAPSULE | Freq: Two times a day (BID) | ORAL | Status: DC

## 2022-08-06 MED ORDER — TOPIRAMATE 100 MG PO TABS
100.0000 mg | ORAL_TABLET | Freq: Every day | ORAL | Status: DC
  Administered 2022-08-06 – 2022-08-08 (×3): 100 mg via ORAL
  Filled 2022-08-06 (×2): qty 1
  Filled 2022-08-06: qty 4

## 2022-08-06 MED ORDER — IPRATROPIUM-ALBUTEROL 0.5-2.5 (3) MG/3ML IN SOLN
3.0000 mL | Freq: Four times a day (QID) | RESPIRATORY_TRACT | Status: DC
  Administered 2022-08-06 – 2022-08-08 (×8): 3 mL via RESPIRATORY_TRACT
  Filled 2022-08-06 (×10): qty 3

## 2022-08-06 MED ORDER — MONTELUKAST SODIUM 10 MG PO TABS
10.0000 mg | ORAL_TABLET | Freq: Every day | ORAL | Status: DC
  Administered 2022-08-06 – 2022-08-07 (×3): 10 mg via ORAL
  Filled 2022-08-06 (×3): qty 1

## 2022-08-06 MED ORDER — ALBUTEROL SULFATE (2.5 MG/3ML) 0.083% IN NEBU
2.5000 mg | INHALATION_SOLUTION | Freq: Four times a day (QID) | RESPIRATORY_TRACT | Status: DC
  Administered 2022-08-06 (×2): 2.5 mg via RESPIRATORY_TRACT
  Filled 2022-08-06 (×2): qty 3

## 2022-08-06 MED ORDER — FAMOTIDINE 20 MG PO TABS
40.0000 mg | ORAL_TABLET | Freq: Every day | ORAL | Status: DC
  Administered 2022-08-06 – 2022-08-07 (×2): 40 mg via ORAL
  Filled 2022-08-06 (×2): qty 2

## 2022-08-06 MED ORDER — OXYCODONE HCL 5 MG PO TABS
5.0000 mg | ORAL_TABLET | ORAL | Status: DC | PRN
  Administered 2022-08-06 – 2022-08-07 (×4): 5 mg via ORAL
  Filled 2022-08-06 (×4): qty 1

## 2022-08-06 MED ORDER — RIVAROXABAN 20 MG PO TABS
20.0000 mg | ORAL_TABLET | Freq: Every day | ORAL | Status: DC
  Administered 2022-08-06 – 2022-08-08 (×3): 20 mg via ORAL
  Filled 2022-08-06 (×3): qty 1

## 2022-08-06 MED ORDER — PANTOPRAZOLE SODIUM 40 MG PO TBEC
40.0000 mg | DELAYED_RELEASE_TABLET | Freq: Two times a day (BID) | ORAL | Status: DC
  Administered 2022-08-06 – 2022-08-08 (×6): 40 mg via ORAL
  Filled 2022-08-06 (×6): qty 1

## 2022-08-06 MED ORDER — GUAIFENESIN ER 600 MG PO TB12
600.0000 mg | ORAL_TABLET | Freq: Two times a day (BID) | ORAL | Status: DC
  Administered 2022-08-06 – 2022-08-08 (×6): 600 mg via ORAL
  Filled 2022-08-06 (×7): qty 1

## 2022-08-06 MED ORDER — ALPRAZOLAM 0.25 MG PO TABS
0.2500 mg | ORAL_TABLET | Freq: Two times a day (BID) | ORAL | Status: DC
  Administered 2022-08-06 – 2022-08-08 (×5): 0.25 mg via ORAL
  Filled 2022-08-06 (×5): qty 1

## 2022-08-06 MED ORDER — ACETAMINOPHEN 650 MG RE SUPP: 650.0000 mg | Freq: Four times a day (QID) | RECTAL | Status: DC | PRN

## 2022-08-06 MED ORDER — ONDANSETRON HCL 4 MG PO TABS: 4.0000 mg | ORAL_TABLET | Freq: Four times a day (QID) | ORAL | Status: DC | PRN

## 2022-08-06 MED ORDER — HEPARIN SODIUM (PORCINE) 5000 UNIT/ML IJ SOLN: 5000.0000 [IU] | Freq: Three times a day (TID) | INTRAMUSCULAR | Status: DC

## 2022-08-06 MED ORDER — QUETIAPINE FUMARATE 100 MG PO TABS
100.0000 mg | ORAL_TABLET | Freq: Every day | ORAL | Status: DC
  Administered 2022-08-06 – 2022-08-07 (×3): 100 mg via ORAL
  Filled 2022-08-06 (×3): qty 1

## 2022-08-06 MED ORDER — HEPARIN SODIUM (PORCINE) 5000 UNIT/ML IJ SOLN
5000.0000 [IU] | Freq: Three times a day (TID) | INTRAMUSCULAR | Status: DC
  Administered 2022-08-06: 5000 [IU] via SUBCUTANEOUS
  Filled 2022-08-06: qty 1

## 2022-08-06 MED ORDER — MORPHINE SULFATE (PF) 2 MG/ML IV SOLN: 2.0000 mg | INTRAVENOUS | Status: DC | PRN

## 2022-08-06 NOTE — ED Notes (Signed)
Dinner tray given

## 2022-08-06 NOTE — Assessment & Plan Note (Signed)
-   Patient cannot provide history at this time so cannot say how much she smokes or if she would like nicotine patch - When she is more alert she will require counseling on cessation

## 2022-08-06 NOTE — Assessment & Plan Note (Signed)
Continue Protonix °

## 2022-08-06 NOTE — Assessment & Plan Note (Signed)
Continue Zocor 

## 2022-08-06 NOTE — Assessment & Plan Note (Signed)
-   Hypersomnolent even during exam - Get a VBG to ensure that she is not started retaining CO2 overnight - She did have slight CO2 retention on last night VBG with a pCO2 of 53 and a pH of 7.33, will assess trend with new VBG - Continue to monitor

## 2022-08-06 NOTE — Assessment & Plan Note (Signed)
-   Continue Topamax and Lamictal

## 2022-08-06 NOTE — Assessment & Plan Note (Signed)
-   CTA shows persistent and slightly increased bilateral airspace opacities favoring multifocal infection - Patient has multiple drug allergies - Pharmacy recommended aztreonam and vancomycin - Blood culture pending - Expectorated sputum culture pending - Continue to monitor

## 2022-08-06 NOTE — Assessment & Plan Note (Addendum)
-   Secondary to pneumonia and COPD exacerbation - Multiple drug allergies, continue vancomycin and aztreonam as discussed with pharmacy by the ED provider - Blood cultures - Expectorated sputum culture pending - Wean off oxygen as tolerated - Albuterol and DuoNebs ordered - Continue to monitor

## 2022-08-06 NOTE — Progress Notes (Signed)
  Transition of Care [facility]) Screening Note   Patient Details  Name: [name] Date of Birth: 10-03-1955   [facility] CM/SW Contact:    Ihor Gully, LCSW Phone Number: 08/06/2022, 2:21 PM    Transition of Care Department [facility]) has reviewed patient and no TOC needs have been identified at this time. We will continue to monitor patient advancement through interdisciplinary progression rounds. If new patient transition needs arise, please place a TOC consult.

## 2022-08-06 NOTE — Assessment & Plan Note (Signed)
Patient potassium was 3.1, she received 10 mEq of potassium and now her potassium is 3.8

## 2022-08-06 NOTE — H&P (Signed)
History and Physical    Patient: [name] YCX:[id number] DOB: 1956/01/22 DOA: 08/05/2022 DOS: the patient was seen and examined on 08/06/2022 PCP: Olga Coaster, FNP  Patient coming from: Home  Chief Complaint:  Chief Complaint  Patient presents with   Shortness of Breath   HPI: [name] is a 66 y.o. female with medical history significant of asthma, COPD, depression, epilepsy, GERD, hyperlipidemia, hypertension, migraines, seizures, type 2 diabetes mellitus, and more presents the ED with a chief complaint of shortness of breath.  Patient is not awake to provide any history to me.  With sternal rub she groans and moves and grabs my hand, but she will not stay awake long enough to answer questions.  According to chart review patient has felt short of breath for the past few days.  She went to [facility] and they told her she had a UTI.  They gave her antibiotics and steroids apparently and sent her home.  Today she got more short of breath so she called EMS.  EMS gave her a DuoNeb, and mag.  Patient is normally on 3 L nasal cannula at baseline.  Today she was requiring 4 L nasal cannula.  No further history could be obtained at this time. Review of Systems: unable to review all systems due to the inability of the patient to answer questions. Past Medical History:  Diagnosis Date   Allergic rhinitis    Arthritis    "hands and legs and feet" (04/27/2012)   Asthma    Cancer ([place]) 2005   rt arm mole rem cancer   Chronic bronchitis ([place])    "keep it all the time" (04/27/2012)   COPD (chronic obstructive pulmonary disease) ([place])    COVID-19 virus infection    03-2021   Depression    PT DENIES   Diverticulosis    DJD (degenerative joint disease)    Emphysema    Epilepsy ([place])    Esophageal stricture    Exertional dyspnea    GERD (gastroesophageal reflux disease)    Glaucoma    H/O blood clots    Right leg   History of COVID-19 03/2021   Hyperlipidemia    Hypertension    IBS  (irritable bowel syndrome)    Migraine    Neck pain    Cervical disc   OSA on CPAP    6-7 yrs; pt wears CPAP but has not been able to use it lately due to sinus issues   Oxygen deficiency    USES 3 LITERS 02 CONTINOUS   Pneumonia 2012; 04/27/2012   Recurrent upper respiratory infection (URI)    Right leg DVT ([place]) 1982   groin   Seizures ([place])    LAST SEIZURE 6 MONTHS OR  LONGER   Sleep apnea    Type II diabetes mellitus (HCC)    No meds since weight loss   UTI (urinary tract infection)    2 weeks ago   Vertigo 10/24/2014   Past Surgical History:  Procedure Laterality Date   ABDOMINAL HYSTERECTOMY  2001   "partial"   BIOPSY  04/16/2021   Procedure: BIOPSY;  Surgeon: Mauri Pole, MD;  Location: [facility];  Service: Endoscopy;;   CARPAL TUNNEL RELEASE  ~ 2008   right   CATARACT EXTRACTION Bilateral    COLONOSCOPY     COLONOSCOPY WITH ESOPHAGOGASTRODUODENOSCOPY (EGD)     ESOPHAGOGASTRODUODENOSCOPY (EGD) WITH PROPOFOL N/A 04/16/2021   Procedure: ESOPHAGOGASTRODUODENOSCOPY (EGD) WITH PROPOFOL;  Surgeon: Harl Bowie  V, MD;  Location: [facility];  Service: Endoscopy;  Laterality: N/A;   KNEE ARTHROSCOPY  1990's   left   NASAL SEPTOPLASTY W/ TURBINOPLASTY Bilateral 05/23/2016   Procedure: NASAL SEPTOPLASTY WITH TURBINATE REDUCTION;  Surgeon: Jerrell Belfast, MD;  Location: [facility];  Service: ENT;  Laterality: Bilateral;   SHOULDER OPEN ROTATOR CUFF REPAIR  ~ 2010   left   SINOSCOPY     SINUS ENDO WITH FUSION Right 05/23/2016   Procedure: LIMITED RIGHT ENDOSCOPIC SINUS SURGERY;  Surgeon: Jerrell Belfast, MD;  Location: [place];  Service: ENT;  Laterality: Right;   TOTAL KNEE ARTHROPLASTY  08/23/2012   right   TOTAL KNEE ARTHROPLASTY  08/23/2012   Procedure: TOTAL KNEE ARTHROPLASTY;  Surgeon: Lorn Junes, MD;  Location: [place];  Service: Orthopedics;  Laterality: Right;  RIGHT KNEE ARTHROPLASTY MEDIAL AND LATERAL COMPARTMENTS WITH PATELLA RESURFACING   TUBAL  LIGATION  2001   UPPER GASTROINTESTINAL ENDOSCOPY     Social History:  reports that she has been smoking cigarettes. She started smoking about 47 years ago. She has a 40.00 pack-year smoking history. She has never used smokeless tobacco. She reports that she does not drink alcohol and does not use drugs.  Allergies  Allergen Reactions   Amoxicillin Anaphylaxis    Breakout, mouth swells   Azithromycin Swelling and Rash    "swelling all over, hives, thrush"   Cefdinir     Throat swelling after 3rd dose   Chantix [Varenicline] Other (See Comments)    "bad dreams, difficulty breathing"   Doxycycline Hyclate Anaphylaxis and Rash    swelling   Levofloxacin Anaphylaxis   Penicillins Anaphylaxis, Shortness Of Breath and Rash    "ALMOST DIED, ended up in hospital"  PCN reaction causing immediate rash, facial/tongue/throat swelling, SOB or lightheadedness with hypotension: YES PCN reaction causing severe rash involving mucus membranes or skin necrosis: NO PCN reaction that required hospitalization YES Has patient had a PCN reaction occurring within the last 10 years: NO    Sulfonamide Derivatives Shortness Of Breath, Swelling and Rash    "break out from head to toe"   Toradol [Ketorolac Tromethamine] Other (See Comments)    Seizure    Clarithromycin Hives    swelling   Nortriptyline Swelling, Rash and Other (See Comments)    Per patient had "kidney problems" on this medication, could not use bathroom (urinary retention)   Paxlovid [Nirmatrelvir-Ritonavir] Swelling    Throat swelling   Codeine Nausea And Vomiting   Fluticasone-Salmeterol Other (See Comments)    REACTION: ulcers in mouth   Triptans Rash    Mouth breaks out and start itching    Family History  Problem Relation Age of Onset   Diabetes Mother    Lung cancer Mother        was a smoker   Stroke Mother    Alcohol abuse Father    Lung cancer Father        was a smoker   Emphysema Father        was a smoker   Heart  disease Sister    Other Sister        blood clotting disorder   Diabetes Brother    Seizures Brother    Diabetes Maternal Aunt    Breast cancer Maternal Aunt    Esophageal cancer Maternal Uncle    Diabetes Maternal Uncle    Heart disease Maternal Grandfather    Diabetes Maternal Grandfather    Bipolar disorder Son  Muscular dystrophy Son    Heart disease Son    Heart disease Other        uncle   Colon cancer Neg Hx    Pancreatic cancer Neg Hx    Stomach cancer Neg Hx    Colon polyps Neg Hx    Rectal cancer Neg Hx     Prior to Admission medications   Medication Sig Start Date End Date Taking? Authorizing Provider  albuterol (PROAIR HFA) 108 (90 Base) MCG/ACT inhaler INHALE TWO PUFFS INTO THE LUNGS EVERY 6 HOURS AS NEEDED FOR SHORTNESS OF BREATH 12/18/20   [name], [name], MD  albuterol (PROVENTIL) (2.5 MG/3ML) 0.083% nebulizer solution USE 1 VIAL IN NEBULIZER EVERY 6 HOURS AS NEEDED FOR SHORTNESS OF BREATH 01/18/21   [name], [name], MD  ALLERGY RELIEF 10 MG tablet TAKE ONE TABLET BY MOUTH DAILY 07/22/21   [name], [name], MD  aspirin EC 81 MG tablet Take 81 mg by mouth daily. Swallow whole.    [provider]  Azelastine HCl (ASTEPRO) 0.15 % SOLN Place 1 spray into the nose 2 (two) times daily as needed (Allergies). 01/13/22   Chesley Mires, MD  Cholecalciferol (VITAMIN D3) 50 MCG (2000 UT) capsule TAKE ONE CAPSULE BY MOUTH DAILY 03/27/21   [name], [name], MD  ferrous sulfate 324 (65 Fe) MG TBEC TAKE ONE TABLET BY MOUTH EVERY OTHER DAY 05/20/21   [name], [name], MD  fluticasone (FLONASE) 50 MCG/ACT nasal spray Place 1 spray into both nostrils daily.    [provider]  Fluticasone-Umeclidin-Vilant (TRELEGY ELLIPTA) 100-62.5-25 MCG/ACT AEPB Inhale 1 puff into the lungs daily in the afternoon. 07/10/22   Chesley Mires, MD  gabapentin (NEURONTIN) 100 MG capsule Take 1 capsule (100 mg total) by mouth at bedtime. Patient  taking differently: Take 100 mg by mouth 3 (three) times daily. 11/12/20   [name], [name], MD  guaiFENesin (MUCINEX) 600 MG 12 hr tablet Take 2 tablets (1,200 mg total) by mouth 2 (two) times daily as needed. 07/10/22   Chesley Mires, MD  lamoTRIgine (LAMICTAL) 200 MG tablet TAKE 1 AND 1/2 TABLETS BY MOUTH TWICE DAILY 07/22/22   Alric Ran, MD  montelukast (SINGULAIR) 10 MG tablet TAKE ONE TABLET BY MOUTH AT BEDTIME 06/27/22   Chesley Mires, MD  OXYGEN Inhale 3 L into the lungs continuous.    [provider]  pantoprazole (PROTONIX) 40 MG tablet Take 1 tablet (40 mg total) by mouth 2 (two) times daily. **PLEASE CALL OFFICE TO SCHEDULE FOLLOW UP 01/29/22   [name], PA  predniSONE (DELTASONE) 10 MG tablet Take 3 tablets (30 mg total) by mouth daily with breakfast for 3 days, THEN 2 tablets (20 mg total) daily with breakfast for 3 days, THEN 1 tablet (10 mg total) daily with breakfast for 3 days. 08/01/22 08/10/22  Collene Gobble, MD  QUEtiapine (SEROQUEL) 100 MG tablet TAKE ONE TABLET BY MOUTH AT BEDTIME 04/22/22   Alric Ran, MD  Sennosides (EX-LAX PO) Take by mouth.    [provider]  simvastatin (ZOCOR) 40 MG tablet TAKE ONE TABLET BY MOUTH AT BEDTIME 03/18/21   [name], [name], MD  tiZANidine (ZANAFLEX) 4 MG tablet TAKE ONE TABLET BY MOUTH EVERY 6 HOURS AS NEEDED FOR MUSCLE SPASMS FOR UP TO 30 DOSES 07/10/22   Alric Ran, MD  topiramate (TOPAMAX) 100 MG tablet Take 200 mg twice daily 07/24/21   [name], NP    Physical Exam: Vitals:   08/06/22 0430 08/06/22 0500 08/06/22 0530 08/06/22 0600  BP: (!) 97/57 1'12/62 91/60 97/60 '$  Pulse: 94 90 89 86  Resp: '17 18 16 17  '$ Temp:      TempSrc:      SpO2: 98% 98% 98% 96%  Weight:      Height:       1.  General: Patient lying supine in bed,  no acute distress   2. Psychiatric: Nonverbal, hypersomnolent, not following commands   3. Neurologic: Nonverbal, hypersomnolent, not  following commands   4. HEENMT:  Head is atraumatic, normocephalic, pupils reactive to light, neck is supple, trachea is midline, mucous membranes are moist   5. Respiratory : Bilateral wheezing less on right than left, no rhonchi, no cyanosis, no increase in work of breathing   6. Cardiovascular : Heart rate normal, rhythm is regular, no murmurs, rubs or gallops, no peripheral edema, peripheral pulses palpated   7. Gastrointestinal:  Abdomen is soft, nondistended, nontender to palpation bowel sounds active, no masses or organomegaly palpated   8. Skin:  Skin is warm, dry and intact without rashes, acute lesions, or ulcers on limited exam   9.Musculoskeletal:  No acute deformities or trauma, no asymmetry in tone, no peripheral edema, peripheral pulses palpated, no tenderness to palpation in the extremities  Data Reviewed: In the ED Temp 99, heart rate 91-107, respiratory rate 17-25, blood pressure 119/61-156/103 Patient maintaining oxygen sats on 4 L nasal cannula pH 7.33, pCO2 53 Leukocytosis at 16, hemoglobin 10.8, platelets 247 Hypokalemia at 3.1 She received 10 mEq of potassium and now her potassium is 3.8 Blood cultures pending CTA shows persistent and slightly increased bilateral airspace opacity favoring multifocal infection Pharmacy was consulted for antibiotic recommendations given patient's many allergies and recommended aztreonam and vancomycin Patient was given those antibiotics as well as DuoNeb, Solu-Medrol Admission was requested for pneumonia and COPD exacerbation  Assessment and Plan: * Acute respiratory failure with hypoxia ([place]) - Secondary to pneumonia and COPD exacerbation - Multiple drug allergies, continue vancomycin and aztreonam as discussed with pharmacy by the ED provider - Blood cultures - Expectorated sputum culture pending - Wean off oxygen as tolerated - Albuterol and DuoNebs ordered - Continue to monitor  Hypokalemia Patient potassium was  3.1, she received 10 mEq of potassium and now her potassium is 3.8  Acute metabolic encephalopathy - Hypersomnolent even during exam - Get a VBG to ensure that she is not started retaining CO2 overnight - She did have slight CO2 retention on last night VBG with a pCO2 of 53 and a pH of 7.33, will assess trend with new VBG - Continue to monitor  CAP (community acquired pneumonia) - CTA shows persistent and slightly increased bilateral airspace opacities favoring multifocal infection - Patient has multiple drug allergies - Pharmacy recommended aztreonam and vancomycin - Blood culture pending - Expectorated sputum culture pending - Continue to monitor  Tobacco dependence - Patient cannot provide history at this time so cannot say how much she smokes or if she would like nicotine patch - When she is more alert she will require counseling on cessation  Seizure disorder (HCC) - Continue Topamax and Lamictal  GERD (gastroesophageal reflux disease) - Continue Protonix  COPD (chronic obstructive pulmonary disease) ([place]) - Wheezing greater on left than right - Triggered by pneumonia - Continue Singulair, Trelegy, as needed albuterol, scheduled DuoNebs, steroids - Treat pneumonia with vancomycin and aztreonam - Continue to monitor  Hyperlipidemia - Continue Zocor      Advance Care Planning:   Code Status: Full Code  Consults: No consults  Family Communication: No family at bedside  Severity of Illness: The appropriate patient status for this patient is INPATIENT. Inpatient status is judged to be reasonable and necessary in order to provide the required intensity of service to ensure the patient's safety. The patient's presenting symptoms, physical exam findings, and initial radiographic and laboratory data in the context of their chronic comorbidities is felt to place them at high risk for further clinical deterioration. Furthermore, it is not anticipated that the patient will be  medically stable for discharge from the hospital within 2 midnights of admission.   * I certify that at the point of admission it is my clinical judgment that the patient will require inpatient hospital care spanning beyond 2 midnights from the point of admission due to high intensity of service, high risk for further deterioration and high frequency of surveillance required.*  Author: Rolla Plate, DO 08/06/2022 6:38 AM  For on call review [email or web address].

## 2022-08-06 NOTE — Progress Notes (Signed)
Patient reports that she wears CPAP at home. Told her we would set her up with one once she got a bed upstairs.

## 2022-08-06 NOTE — Progress Notes (Signed)
[name] is a 66 y.o. female with medical history significant of asthma, COPD, depression, epilepsy, GERD, hyperlipidemia, hypertension, migraines, seizures, type 2 diabetes mellitus, and more presents the ED with a chief complaint of shortness of breath.  She was admitted with acute metabolic encephalopathy and hypoxemic respiratory failure in the setting of multifocal community-acquired pneumonia.  She does have history of seizure disorder and appears to take benzodiazepines on a routine basis which will be resumed.  She also was noted to have a history of DVT and is on Xarelto which will be resumed.  Patient admitted after midnight and seen and evaluated at bedside and appears to be more arousable this morning.  Continue current treatments as ordered with vancomycin and aztreonam given multiple drug allergies.  Continue to follow a.m. labs and wean oxygen to baseline 3 L nasal cannula at home.  Anticipate discharge in the next 24-48 hours pending clinical improvement.  Total care time: 25 minutes.

## 2022-08-06 NOTE — Assessment & Plan Note (Addendum)
-   Wheezing greater on left than right - Triggered by pneumonia - Continue Singulair, Trelegy, as needed albuterol, scheduled DuoNebs, steroids - Treat pneumonia with vancomycin and aztreonam - Continue to monitor

## 2022-08-07 ENCOUNTER — Telehealth (HOSPITAL_COMMUNITY): Payer: Self-pay | Admitting: Pharmacy Technician

## 2022-08-07 ENCOUNTER — Other Ambulatory Visit (HOSPITAL_COMMUNITY): Payer: Self-pay

## 2022-08-07 DIAGNOSIS — J9601 Acute respiratory failure with hypoxia: Secondary | ICD-10-CM | POA: Diagnosis not present

## 2022-08-07 LAB — BASIC METABOLIC PANEL
Anion gap: 8 (ref 5–15)
BUN: 24 mg/dL — ABNORMAL HIGH (ref 8–23)
CO2: 26 mmol/L (ref 22–32)
Calcium: 8.9 mg/dL (ref 8.9–10.3)
Chloride: 109 mmol/L (ref 98–111)
Creatinine, Ser: 0.88 mg/dL (ref 0.44–1.00)
GFR, Estimated: >60 mL/min (ref >60)
Glucose, Bld: 104 mg/dL — ABNORMAL HIGH (ref 70–99)
Potassium: 3.4 mmol/L — ABNORMAL LOW (ref 3.5–5.1)
Sodium: 143 mmol/L (ref 135–145)

## 2022-08-07 LAB — GLUCOSE, CAPILLARY
Glucose-Capillary: 123 mg/dL — ABNORMAL HIGH (ref 70–99)
Glucose-Capillary: 130 mg/dL — ABNORMAL HIGH (ref 70–99)
Glucose-Capillary: 137 mg/dL — ABNORMAL HIGH (ref 70–99)
Glucose-Capillary: 96 mg/dL (ref 70–99)

## 2022-08-07 LAB — CBC
HCT: 32.2 % — ABNORMAL LOW (ref 36.0–46.0)
Hemoglobin: 9.6 g/dL — ABNORMAL LOW (ref 12.0–15.0)
MCH: 30.7 pg (ref 26.0–34.0)
MCHC: 29.8 g/dL — ABNORMAL LOW (ref 30.0–36.0)
MCV: 102.9 fL — ABNORMAL HIGH (ref 80.0–100.0)
Platelets: 207 10*3/uL (ref 150–400)
RBC: 3.13 MIL/uL — ABNORMAL LOW (ref 3.87–5.11)
RDW: 15.7 % — ABNORMAL HIGH (ref 11.5–15.5)
WBC: 11.8 10*3/uL — ABNORMAL HIGH (ref 4.0–10.5)
nRBC: 0 % (ref 0.0–0.2)

## 2022-08-07 LAB — LEGIONELLA PNEUMOPHILA SEROGP 1 UR AG: L. pneumophila Serogp 1 Ur Ag: NEGATIVE

## 2022-08-07 LAB — MAGNESIUM: Magnesium: 2.3 mg/dL (ref 1.7–2.4)

## 2022-08-07 MED ORDER — EPINEPHRINE 0.3 MG/0.3ML IJ SOAJ
0.3000 mg | Freq: Once | INTRAMUSCULAR | Status: DC
  Filled 2022-08-07: qty 0.3

## 2022-08-07 MED ORDER — MOXIFLOXACIN HCL 400 MG PO TABS
400.0000 mg | ORAL_TABLET | Freq: Every day | ORAL | Status: DC
  Administered 2022-08-07: 400 mg via ORAL
  Filled 2022-08-07 (×4): qty 1

## 2022-08-07 MED ORDER — POTASSIUM CHLORIDE CRYS ER 20 MEQ PO TBCR
40.0000 meq | EXTENDED_RELEASE_TABLET | Freq: Once | ORAL | Status: AC
  Administered 2022-08-07: 40 meq via ORAL
  Filled 2022-08-07: qty 2

## 2022-08-07 NOTE — Progress Notes (Signed)
Spouse wants [place] services. All HH agencies deny patient due to her having Medicaid only. Discussed CAP services. Spouse interested. Message left for Loma Sousa, [employer] on [employer] [employer], with patient demographics, for CAP process referral.

## 2022-08-07 NOTE — TOC Benefit Eligibility Note (Signed)
Patient Teacher, English as a foreign language completed.    The patient is currently admitted and upon discharge could be taking Xenleta 600 mg tablets.  Prior Authorization Required  The patient is insured through [place], [place] Patient Advocate Specialist [place] Patient Advocate Team Direct Number: [phone]  Fax: [phone]

## 2022-08-07 NOTE — Progress Notes (Signed)
Pt would only moan when I asked her questions.

## 2022-08-07 NOTE — Plan of Care (Signed)
  Problem: Activity: Goal: Ability to tolerate increased activity will improve Outcome: Progressing   

## 2022-08-07 NOTE — Progress Notes (Signed)
PROGRESS NOTE    [name]  YQM:[id number] DOB: Jan 14, 1956 DOA: 08/05/2022 PCP: Olga Coaster, FNP   Brief Narrative:  AKITA MAXIM is a 66 y.o. female with medical history significant of asthma, COPD, depression, epilepsy, GERD, hyperlipidemia, hypertension, migraines, seizures, type 2 diabetes mellitus, and more presents the ED with a chief complaint of shortness of breath.  She was admitted with acute metabolic encephalopathy and hypoxemic respiratory failure in the setting of multifocal community-acquired pneumonia.   She was started on IV vancomycin and aztreonam empirically given her multiple drug allergies.  She is clinically improved today and was being considered for discharge, but will be challenged with moxifloxacin to ensure safety prior to discharge anticipated 12/29.   Assessment & Plan:   Principal Problem:   Acute respiratory failure with hypoxia (HCC) Active Problems:   Hyperlipidemia   COPD (chronic obstructive pulmonary disease) (HCC)   GERD (gastroesophageal reflux disease)   Seizure disorder (HCC)   Tobacco dependence   CAP (community acquired pneumonia)   Acute metabolic encephalopathy   Hypokalemia  Assessment and Plan:   Acute respiratory failure with hypoxia ([place]) - Secondary to pneumonia and COPD exacerbation -Now stable on usual home oxygen 3 L - Switch to moxifloxacin and monitor for anaphylaxis starting 12/28; EpiPen at bedside - Blood cultures - Expectorated sputum culture pending - Wean off oxygen as tolerated - Albuterol and DuoNebs ordered - Continue to monitor   Hypokalemia Replete and monitor   Acute metabolic encephalopathy-resolved - Secondary to pneumonia   CAP (community acquired pneumonia) - CTA shows persistent and slightly increased bilateral airspace opacities favoring multifocal infection - Patient has multiple drug allergies - Discontinue vancomycin and aztreonam and switch to moxifloxacin 12/28 -Blood cultures with no  growth   Tobacco dependence - Patient cannot provide history at this time so cannot say how much she smokes or if she would like nicotine patch - When she is more alert she will require counseling on cessation   Seizure disorder ([place]) - Continue Topamax and Lamictal   GERD (gastroesophageal reflux disease) - Continue Protonix   COPD (chronic obstructive pulmonary disease) ([place]) - Wheezing greater on left than right - Triggered by pneumonia - Continue Singulair, Trelegy, as needed albuterol, scheduled DuoNebs, steroids - Treat pneumonia with vancomycin and aztreonam - Continue to monitor   Hyperlipidemia - Continue Zocor  Obesity -BMI 33.62    DVT prophylaxis: Xarelto Code Status: Full Family Communication: Husband at bedside 12/28 Disposition Plan:  Status is: Inpatient Remains inpatient appropriate because: Need for close monitoring, IV medications   Consultants:  Discussed case with ID 12/28  Procedures:  None  Antimicrobials:  Anti-infectives (From admission, onward)    Start     Dose/Rate Route Frequency Ordered Stop   08/07/22 1600  moxifloxacin (AVELOX) tablet 400 mg        400 mg Oral Daily 08/07/22 1133     08/06/22 2100  vancomycin (VANCOREADY) IVPB 750 mg/150 mL  Status:  Discontinued        750 mg 150 mL/hr over 60 Minutes Intravenous Every 24 hours 08/05/22 2148 08/07/22 1123   08/06/22 1000  oseltamivir (TAMIFLU) capsule 75 mg  Status:  Discontinued        75 mg Oral 2 times daily 08/06/22 0632 08/06/22 0633   08/05/22 2200  aztreonam (AZACTAM) 1 g in sodium chloride 0.9 % 100 mL IVPB  Status:  Discontinued        1 g 200 mL/hr over 30  Minutes Intravenous Every 8 hours 08/05/22 2020 08/07/22 1123   08/05/22 2030  vancomycin (VANCOREADY) IVPB 1750 mg/350 mL        1,750 mg 175 mL/hr over 120 Minutes Intravenous  Once 08/05/22 2022 08/05/22 2314       Subjective: Patient seen and evaluated today with no new acute complaints or concerns. No acute  concerns or events noted overnight.  She appears more awake and alert with no significant respiratory issues noted today.  Objective: Vitals:   08/07/22 0319 08/07/22 0522 08/07/22 0839 08/07/22 0840  BP: 94/69 104/65    Pulse: 84 82    Resp: 18 20    Temp: 97.6 F (36.4 C) (!) 97.5 F (36.4 C)    TempSrc: Axillary Oral    SpO2: 96% 93% 91% 93%  Weight:      Height:        Intake/Output Summary (Last 24 hours) at 08/07/2022 1134 Last data filed at 08/07/2022 0100 Gross per 24 hour  Intake 353.87 ml  Output 600 ml  Net -246.13 ml   Filed Weights   08/05/22 1605 08/07/22 0049  Weight: 78.5 kg 80.7 kg    Examination:  General exam: Appears calm and comfortable  Respiratory system: Clear to auscultation. Respiratory effort normal.  3 L nasal cannula oxygen Cardiovascular system: S1 & S2 heard, RRR.  Gastrointestinal system: Abdomen is soft Central nervous system: Alert and awake Extremities: No edema Skin: No significant lesions noted Psychiatry: Flat affect.    Data Reviewed: I have personally reviewed following labs and imaging studies  CBC: Recent Labs  Lab 08/05/22 1640 08/06/22 0450 08/07/22 0445  WBC 16.0* 13.8* 11.8*  NEUTROABS  --  12.6*  --   HGB 10.8* 9.5* 9.6*  HCT 35.3* 30.9* 32.2*  MCV 101.1* 100.7* 102.9*  PLT 246 201 482   Basic Metabolic Panel: Recent Labs  Lab 08/05/22 1640 08/06/22 0450 08/07/22 0445  NA 141 141 143  K 3.1* 3.8 3.4*  CL 106 109 109  CO2 '25 25 26  '$ GLUCOSE 116* 170* 104*  BUN 24* 22 24*  CREATININE 1.17* 0.89 0.88  CALCIUM 8.7* 8.5* 8.9  MG 2.1 2.1 2.3   GFR: Estimated Creatinine Clearance: 62.2 mL/min (by C-G formula based on SCr of 0.88 mg/dL). Liver Function Tests: Recent Labs  Lab 08/06/22 0450  AST 14*  ALT 16  ALKPHOS 93  BILITOT 0.5  PROT 6.8  ALBUMIN 2.6*   No results for input(s): "LIPASE", "AMYLASE" in the last 168 hours. No results for input(s): "AMMONIA" in the last 168 hours. Coagulation  Profile: No results for input(s): "INR", "PROTIME" in the last 168 hours. Cardiac Enzymes: No results for input(s): "CKTOTAL", "CKMB", "CKMBINDEX", "TROPONINI" in the last 168 hours. BNP (last 3 results) Recent Labs    11/18/21 0938  PROBNP 16.0   HbA1C: No results for input(s): "HGBA1C" in the last 72 hours. CBG: Recent Labs  Lab 08/07/22 0047 08/07/22 0745  GLUCAP 137* 130*   Lipid Profile: No results for input(s): "CHOL", "HDL", "LDLCALC", "TRIG", "CHOLHDL", "LDLDIRECT" in the last 72 hours. Thyroid Function Tests: No results for input(s): "TSH", "T4TOTAL", "FREET4", "T3FREE", "THYROIDAB" in the last 72 hours. Anemia Panel: No results for input(s): "VITAMINB12", "FOLATE", "FERRITIN", "TIBC", "IRON", "RETICCTPCT" in the last 72 hours. Sepsis Labs: Recent Labs  Lab 08/05/22 2039  LATICACIDVEN 1.0    Recent Results (from the past 240 hour(s))  Resp panel by RT-PCR (RSV, Flu A&B, Covid) Anterior Nasal Swab  Status: None   Collection Time: 08/05/22  4:26 PM   Specimen: Anterior Nasal Swab  Result Value Ref Range Status   SARS Coronavirus 2 by RT PCR NEGATIVE NEGATIVE Final    Comment: (NOTE) SARS-CoV-2 target nucleic acids are NOT DETECTED.  The SARS-CoV-2 RNA is generally detectable in upper respiratory specimens during the acute phase of infection. The lowest concentration of SARS-CoV-2 viral copies this assay can detect is 138 copies/mL. A negative result does not preclude SARS-Cov-2 infection and should not be used as the sole basis for treatment or other patient management decisions. A negative result may occur with  improper specimen collection/handling, submission of specimen other than nasopharyngeal swab, presence of viral mutation(s) within the areas targeted by this assay, and inadequate number of viral copies(<138 copies/mL). A negative result must be combined with clinical observations, patient history, and epidemiological information. The expected  result is Negative.  Fact Sheet for Patients:  EntrepreneurPulse.com.au  Fact Sheet for Healthcare Providers:  IncredibleEmployment.be  This test is no t yet approved or cleared by the [place] FDA and  has been authorized for detection and/or diagnosis of SARS-CoV-2 by FDA under an Emergency Use Authorization (EUA). This EUA will remain  in effect (meaning this test can be used) for the duration of the COVID-19 declaration under Section 564(b)(1) of the Act, 21 U.S.C.section 360bbb-3(b)(1), unless the authorization is terminated  or revoked sooner.       Influenza A by PCR NEGATIVE NEGATIVE Final   Influenza B by PCR NEGATIVE NEGATIVE Final    Comment: (NOTE) The Xpert Xpress SARS-CoV-2/FLU/RSV plus assay is intended as an aid in the diagnosis of influenza from Nasopharyngeal swab specimens and should not be used as a sole basis for treatment. Nasal washings and aspirates are unacceptable for Xpert Xpress SARS-CoV-2/FLU/RSV testing.  Fact Sheet for Patients: EntrepreneurPulse.com.au  Fact Sheet for Healthcare Providers: IncredibleEmployment.be  This test is not yet approved or cleared by the [place] FDA and has been authorized for detection and/or diagnosis of SARS-CoV-2 by FDA under an Emergency Use Authorization (EUA). This EUA will remain in effect (meaning this test can be used) for the duration of the COVID-19 declaration under Section 564(b)(1) of the Act, 21 U.S.C. section 360bbb-3(b)(1), unless the authorization is terminated or revoked.     Resp Syncytial Virus by PCR NEGATIVE NEGATIVE Final    Comment: (NOTE) Fact Sheet for Patients: EntrepreneurPulse.com.au  Fact Sheet for Healthcare Providers: IncredibleEmployment.be  This test is not yet approved or cleared by the [place] FDA and has been authorized for detection and/or diagnosis of  SARS-CoV-2 by FDA under an Emergency Use Authorization (EUA). This EUA will remain in effect (meaning this test can be used) for the duration of the COVID-19 declaration under Section 564(b)(1) of the Act, 21 U.S.C. section 360bbb-3(b)(1), unless the authorization is terminated or revoked.  Performed at [facility], [address] [place], [place] [address]   Culture, blood (routine x 2)     Status: None (Preliminary result)   Collection Time: 08/05/22  8:37 PM   Specimen: Right Antecubital; Blood  Result Value Ref Range Status   Specimen Description   Final    RIGHT ANTECUBITAL BOTTLES DRAWN AEROBIC AND ANAEROBIC   Special Requests Blood Culture adequate volume  Final   Culture   Final    NO GROWTH 2 DAYS Performed at [facility], [address], [place], [place] [address]    Report Status PENDING  Incomplete  Culture, blood (routine  x 2)     Status: None (Preliminary result)   Collection Time: 08/05/22  8:39 PM   Specimen: Site Not Specified; Blood  Result Value Ref Range Status   Specimen Description   Final    SITE NOT SPECIFIED BOTTLES DRAWN AEROBIC AND ANAEROBIC   Special Requests Blood Culture adequate volume  Final   Culture   Final    NO GROWTH 2 DAYS Performed at [facility], [address] [place], [place] [address]    Report Status PENDING  Incomplete         Radiology Studies: CT Angio Chest PE W and/or Wo Contrast  Result Date: 08/05/2022 CLINICAL DATA:  Worsening shortness of breath for several days EXAM: CT ANGIOGRAPHY CHEST WITH CONTRAST TECHNIQUE: Multidetector CT imaging of the chest was performed using the standard protocol during bolus administration of intravenous contrast. Multiplanar CT image reconstructions and MIPs were obtained to evaluate the vascular anatomy. RADIATION DOSE REDUCTION: This exam was performed according to the departmental dose-optimization program which includes automated exposure control, adjustment of the mA and/or kV  according to patient size and/or use of iterative reconstruction technique. CONTRAST:  89m OMNIPAQUE IOHEXOL 350 MG/ML SOLN COMPARISON:  Chest x-ray from earlier in the same day, CT from 05/12/2022. FINDINGS: Cardiovascular: Thoracic aorta demonstrates mild atherosclerotic calcifications. No aneurysmal dilatation or dissection is noted. No cardiac enlargement is seen. The pulmonary artery shows a normal branching pattern without intraluminal filling defect to suggest pulmonary embolism. No cardiac enlargement is seen. No significant coronary calcifications noted. Mediastinum/Nodes: Thoracic inlet is within normal limits. Small likely reactive lymph nodes are noted particularly in the hila bilaterally. The esophagus is within normal limits. Lungs/Pleura: Emphysematous changes are noted. Patchy subpleural airspace opacities are noted slightly progressed when compare with the prior exam. No sizable effusion is seen. No parenchymal nodules are noted. Upper Abdomen: Visualized upper abdomen is within normal limits. Musculoskeletal: No acute rib abnormality is noted. Degenerative changes of the thoracic spine are seen. Review of the MIP images confirms the above findings. IMPRESSION: Persistent and slightly increased bilateral airspace opacities favoring multifocal infection as previously described No evidence of pulmonary emboli. No other focal abnormality is noted. Aortic Atherosclerosis (ICD10-I70.0) and Emphysema (ICD10-J43.9). Electronically Signed   By: MInez CatalinaM.D.   On: 08/05/2022 22:56   DG Chest 2 View  Result Date: 08/05/2022 CLINICAL DATA:  Shortness of breath. EXAM: CHEST - 2 VIEW COMPARISON:  August 03, 2022.  May 12, 2022. FINDINGS: The heart size and mediastinal contours are within normal limits. Mildly increased reticular and patchy opacities are noted concerning for worsening atypical inflammation. The visualized skeletal structures are unremarkable. IMPRESSION: Mildly increased reticular  and patchy lung opacities are noted as described above. Electronically Signed   By: JMarijo ConceptionM.D.   On: 08/05/2022 16:38        Scheduled Meds:  ALPRAZolam  0.25 mg Oral BID   aspirin EC  81 mg Oral Daily   EPINEPHrine  0.3 mg Intramuscular Once   famotidine  40 mg Oral QHS   fluticasone furoate-vilanterol  1 puff Inhalation Daily   And   umeclidinium bromide  1 puff Inhalation Daily   gabapentin  100 mg Oral TID   guaiFENesin  600 mg Oral BID   ipratropium-albuterol  3 mL Nebulization Q6H   lamoTRIgine  300 mg Oral BID   montelukast  10 mg Oral QHS   moxifloxacin  400 mg Oral Q2000   pantoprazole  40 mg  Oral BID   QUEtiapine  100 mg Oral QHS   rivaroxaban  20 mg Oral Daily   simvastatin  40 mg Oral QHS   topiramate  100 mg Oral Daily     LOS: 2 days    Time spent: 35 minutes    Kenyana Husak Darleen Crocker, DO [facility]  If 7PM-7AM, please contact night-coverage [email or web address] 08/07/2022, 11:34 AM

## 2022-08-07 NOTE — Telephone Encounter (Signed)
Patient Advocate Encounter  Prior Authorization for Xenleta '600MG'$  tablets  has been approved.    PA# [employer] Effective dates: 08/07/2022 through 08/08/2023  Patients co-pay is $4.00.     08/21/2023, [place] Patient Advocate Specialist [place] Patient Advocate Team Direct Number: [phone]  Fax: [phone]

## 2022-08-07 NOTE — Telephone Encounter (Signed)
Patient Advocate Encounter   Received notification that prior authorization for Xenleta '600MG'$  tablets is required.   PA submitted on 08/07/2022 Key [id number] Status is pending       Lyndel Safe, [place] Patient Advocate Specialist [place] Patient Advocate Team Direct Number: [phone]  Fax: [phone]

## 2022-08-08 DIAGNOSIS — J9601 Acute respiratory failure with hypoxia: Secondary | ICD-10-CM | POA: Diagnosis not present

## 2022-08-08 LAB — CBC
HCT: 31.2 % — ABNORMAL LOW (ref 36.0–46.0)
Hemoglobin: 9.5 g/dL — ABNORMAL LOW (ref 12.0–15.0)
MCH: 30.8 pg (ref 26.0–34.0)
MCHC: 30.4 g/dL (ref 30.0–36.0)
MCV: 101.3 fL — ABNORMAL HIGH (ref 80.0–100.0)
Platelets: 206 10*3/uL (ref 150–400)
RBC: 3.08 MIL/uL — ABNORMAL LOW (ref 3.87–5.11)
RDW: 15.6 % — ABNORMAL HIGH (ref 11.5–15.5)
WBC: 10.9 10*3/uL — ABNORMAL HIGH (ref 4.0–10.5)
nRBC: 0 % (ref 0.0–0.2)

## 2022-08-08 LAB — BASIC METABOLIC PANEL
Anion gap: 7 (ref 5–15)
BUN: 22 mg/dL (ref 8–23)
CO2: 26 mmol/L (ref 22–32)
Calcium: 8.6 mg/dL — ABNORMAL LOW (ref 8.9–10.3)
Chloride: 108 mmol/L (ref 98–111)
Creatinine, Ser: 0.93 mg/dL (ref 0.44–1.00)
GFR, Estimated: >60 mL/min (ref >60)
Glucose, Bld: 91 mg/dL (ref 70–99)
Potassium: 3.4 mmol/L — ABNORMAL LOW (ref 3.5–5.1)
Sodium: 141 mmol/L (ref 135–145)

## 2022-08-08 LAB — GLUCOSE, CAPILLARY
Glucose-Capillary: 91 mg/dL (ref 70–99)
Glucose-Capillary: 115 mg/dL — ABNORMAL HIGH (ref 70–99)

## 2022-08-08 LAB — MAGNESIUM: Magnesium: 2.2 mg/dL (ref 1.7–2.4)

## 2022-08-08 MED ORDER — POTASSIUM CHLORIDE CRYS ER 20 MEQ PO TBCR
40.0000 meq | EXTENDED_RELEASE_TABLET | Freq: Once | ORAL | Status: AC
  Administered 2022-08-08: 40 meq via ORAL
  Filled 2022-08-08: qty 2

## 2022-08-08 MED ORDER — MOXIFLOXACIN HCL 400 MG PO TABS: 400.0000 mg | ORAL_TABLET | Freq: Every day | ORAL | 0 refills | Status: AC

## 2022-08-08 NOTE — Plan of Care (Signed)
  Problem: Activity: Goal: Ability to tolerate increased activity will improve Outcome: Progressing   

## 2022-08-08 NOTE — Discharge Summary (Signed)
Physician Discharge Summary  [name] WER:[id number] DOB: 1955-11-27 DOA: 08/05/2022  PCP: Olga Coaster, FNP  Admit date: 08/05/2022  Discharge date: 08/08/2022  Admitted From:Home  Disposition:  Home  Recommendations for Outpatient Follow-up:  Follow up with PCP in 1-2 weeks Continue on moxifloxacin as prescribed for 4 more days to complete total 7-day course of treatment for pneumonia.  She is able to tolerate this medication without any allergic reaction/anaphylaxis noted The other home medications as prior  Home Health: PT  Equipment/Devices: Has home hospital bed and is wheelchair-bound, has home 3 L nasal cannula  Discharge Condition:Stable  CODE STATUS: Full  Diet recommendation: Heart Healthy  Brief/Interim Summary: [name] is a 66 y.o. female with medical history significant of asthma, COPD, depression, epilepsy, GERD, hyperlipidemia, hypertension, migraines, seizures, type 2 diabetes mellitus, and more presents the ED with a chief complaint of shortness of breath.  She was admitted with acute metabolic encephalopathy and hypoxemic respiratory failure in the setting of multifocal community-acquired pneumonia.   She was started on IV vancomycin and aztreonam empirically given her multiple drug allergies.  She clinically improved by 12/28 and was challenged with moxifloxacin to see if she would tolerate this well enough to be discharged on it at home and it appears that she is doing well enough on this medication.  She has not had any allergic reaction/anaphylaxis to this and is stable for discharge today with improved clinical condition.  She will remain on moxifloxacin as prescribed for 4 more days to complete total 7-day course of treatment.  Discharge Diagnoses:  Principal Problem:   Acute respiratory failure with hypoxia (HCC) Active Problems:   Hyperlipidemia   COPD (chronic obstructive pulmonary disease) (HCC)   GERD (gastroesophageal reflux disease)    Seizure disorder (HCC)   Tobacco dependence   CAP (community acquired pneumonia)   Acute metabolic encephalopathy   Hypokalemia  Principal discharge diagnosis: Acute metabolic encephalopathy secondary to multifocal community-acquired pneumonia with acute on chronic hypoxemia.  Discharge Instructions  Discharge Instructions     Diet - low sodium heart healthy   Complete by: As directed    Increase activity slowly   Complete by: As directed    No wound care   Complete by: As directed       Allergies as of 08/08/2022       Reactions   Amoxicillin Anaphylaxis   Breakout, mouth swells   Azithromycin Swelling, Rash   "swelling all over, hives, thrush"   Cefdinir    Throat swelling after 3rd dose   Chantix [varenicline] Other (See Comments)   "bad dreams, difficulty breathing"   Doxycycline Hyclate Anaphylaxis, Rash   swelling   Levofloxacin Anaphylaxis   Penicillins Anaphylaxis, Shortness Of Breath, Rash   "ALMOST DIED, ended up in hospital"  PCN reaction causing immediate rash, facial/tongue/throat swelling, SOB or lightheadedness with hypotension: YES PCN reaction causing severe rash involving mucus membranes or skin necrosis: NO PCN reaction that required hospitalization YES Has patient had a PCN reaction occurring within the last 10 years: NO   Sulfonamide Derivatives Shortness Of Breath, Swelling, Rash   "break out from head to toe"   Toradol [ketorolac Tromethamine] Other (See Comments)   Seizure   Clarithromycin Hives   swelling   Nortriptyline Swelling, Rash, Other (See Comments)   Per patient had "kidney problems" on this medication, could not use bathroom (urinary retention)   Paxlovid [nirmatrelvir-ritonavir] Swelling   Throat swelling   Codeine Nausea And Vomiting  Fluticasone-salmeterol Other (See Comments)   REACTION: ulcers in mouth   Triptans Rash   Mouth breaks out and start itching        Medication List     TAKE these medications     albuterol 108 (90 Base) MCG/ACT inhaler Commonly known as: ProAir HFA INHALE TWO PUFFS INTO THE LUNGS EVERY 6 HOURS AS NEEDED FOR SHORTNESS OF BREATH   albuterol (2.5 MG/3ML) 0.083% nebulizer solution Commonly known as: PROVENTIL USE 1 VIAL IN NEBULIZER EVERY 6 HOURS AS NEEDED FOR SHORTNESS OF BREATH   Allergy Relief 10 MG tablet Generic drug: loratadine TAKE ONE TABLET BY MOUTH DAILY   allopurinol 100 MG tablet Commonly known as: ZYLOPRIM Take 100 mg by mouth daily.   ALPRAZolam 0.25 MG tablet Commonly known as: XANAX Take 0.25 mg by mouth 2 (two) times daily.   aspirin EC 81 MG tablet Take 81 mg by mouth daily. Swallow whole.   Azelastine HCl 0.15 % Soln Commonly known as: Astepro Place 1 spray into the nose 2 (two) times daily as needed (Allergies).   cyclobenzaprine 10 MG tablet Commonly known as: FLEXERIL Take 10 mg by mouth at bedtime as needed for muscle spasms.   famotidine 40 MG tablet Commonly known as: PEPCID Take 40 mg by mouth at bedtime.   ferrous sulfate 324 (65 Fe) MG Tbec TAKE ONE TABLET BY MOUTH EVERY OTHER DAY   gabapentin 100 MG capsule Commonly known as: NEURONTIN Take 1 capsule (100 mg total) by mouth at bedtime. What changed: when to take this   guaiFENesin 600 MG 12 hr tablet Commonly known as: Mucinex Take 2 tablets (1,200 mg total) by mouth 2 (two) times daily as needed.   ibuprofen 600 MG tablet Commonly known as: ADVIL Take 600 mg by mouth every 8 (eight) hours as needed for mild pain.   isosorbide mononitrate 60 MG 24 hr tablet Commonly known as: IMDUR Take 60 mg by mouth 2 (two) times daily.   lamoTRIgine 200 MG tablet Commonly known as: LAMICTAL TAKE 1 AND 1/2 TABLETS BY MOUTH TWICE DAILY   montelukast 10 MG tablet Commonly known as: SINGULAIR TAKE ONE TABLET BY MOUTH AT BEDTIME   moxifloxacin 400 MG tablet Commonly known as: AVELOX Take 1 tablet (400 mg total) by mouth daily at 8 pm for 4 days.   nitrofurantoin  (macrocrystal-monohydrate) 100 MG capsule Commonly known as: MACROBID Take 100 mg by mouth 2 (two) times daily.   OXYGEN Inhale 3 L into the lungs continuous.   pantoprazole 40 MG tablet Commonly known as: PROTONIX Take 1 tablet (40 mg total) by mouth 2 (two) times daily. **PLEASE CALL OFFICE TO SCHEDULE FOLLOW UP   predniSONE 10 MG tablet Commonly known as: DELTASONE Take 3 tablets (30 mg total) by mouth daily with breakfast for 3 days, THEN 2 tablets (20 mg total) daily with breakfast for 3 days, THEN 1 tablet (10 mg total) daily with breakfast for 3 days. Start taking on: August 01, 2022   QUEtiapine 100 MG tablet Commonly known as: SEROQUEL TAKE ONE TABLET BY MOUTH AT BEDTIME   simvastatin 40 MG tablet Commonly known as: ZOCOR TAKE ONE TABLET BY MOUTH AT BEDTIME   tiZANidine 4 MG tablet Commonly known as: ZANAFLEX TAKE ONE TABLET BY MOUTH EVERY 6 HOURS AS NEEDED FOR MUSCLE SPASMS FOR UP TO 30 DOSES   topiramate 100 MG tablet Commonly known as: TOPAMAX Take 200 mg twice daily   traZODone 50 MG tablet Commonly known as: DESYREL  Take 50 mg by mouth at bedtime.   Trelegy Ellipta 100-62.5-25 MCG/ACT Aepb Generic drug: Fluticasone-Umeclidin-Vilant Inhale 1 puff into the lungs daily in the afternoon.   Vitamin D3 50 MCG (2000 UT) capsule TAKE ONE CAPSULE BY MOUTH DAILY   Xarelto 20 MG Tabs tablet Generic drug: rivaroxaban Take 20 mg by mouth daily.        Follow-up Information     [place], [place] Dragon, FNP. Schedule an appointment as soon as possible for a visit in 1 week(s).   Specialty: Family Medicine Contact information: [address] [place] [address] [phone]                Allergies  Allergen Reactions   Amoxicillin Anaphylaxis    Breakout, mouth swells   Azithromycin Swelling and Rash    "swelling all over, hives, thrush"   Cefdinir     Throat swelling after 3rd dose   Chantix [Varenicline] Other (See Comments)    "bad dreams,  difficulty breathing"   Doxycycline Hyclate Anaphylaxis and Rash    swelling   Levofloxacin Anaphylaxis   Penicillins Anaphylaxis, Shortness Of Breath and Rash    "ALMOST DIED, ended up in hospital"  PCN reaction causing immediate rash, facial/tongue/throat swelling, SOB or lightheadedness with hypotension: YES PCN reaction causing severe rash involving mucus membranes or skin necrosis: NO PCN reaction that required hospitalization YES Has patient had a PCN reaction occurring within the last 10 years: NO    Sulfonamide Derivatives Shortness Of Breath, Swelling and Rash    "break out from head to toe"   Toradol [Ketorolac Tromethamine] Other (See Comments)    Seizure    Clarithromycin Hives    swelling   Nortriptyline Swelling, Rash and Other (See Comments)    Per patient had "kidney problems" on this medication, could not use bathroom (urinary retention)   Paxlovid [Nirmatrelvir-Ritonavir] Swelling    Throat swelling   Codeine Nausea And Vomiting   Fluticasone-Salmeterol Other (See Comments)    REACTION: ulcers in mouth   Triptans Rash    Mouth breaks out and start itching    Consultations: Discussed with ID [id number]   Procedures/Studies: CT Angio Chest PE W and/or Wo Contrast  Result Date: 08/05/2022 CLINICAL DATA:  Worsening shortness of breath for several days EXAM: CT ANGIOGRAPHY CHEST WITH CONTRAST TECHNIQUE: Multidetector CT imaging of the chest was performed using the standard protocol during bolus administration of intravenous contrast. Multiplanar CT image reconstructions and MIPs were obtained to evaluate the vascular anatomy. RADIATION DOSE REDUCTION: This exam was performed according to the departmental dose-optimization program which includes automated exposure control, adjustment of the mA and/or kV according to patient size and/or use of iterative reconstruction technique. CONTRAST:  17m OMNIPAQUE IOHEXOL 350 MG/ML SOLN COMPARISON:  Chest x-ray from earlier in the  same day, CT from 05/12/2022. FINDINGS: Cardiovascular: Thoracic aorta demonstrates mild atherosclerotic calcifications. No aneurysmal dilatation or dissection is noted. No cardiac enlargement is seen. The pulmonary artery shows a normal branching pattern without intraluminal filling defect to suggest pulmonary embolism. No cardiac enlargement is seen. No significant coronary calcifications noted. Mediastinum/Nodes: Thoracic inlet is within normal limits. Small likely reactive lymph nodes are noted particularly in the hila bilaterally. The esophagus is within normal limits. Lungs/Pleura: Emphysematous changes are noted. Patchy subpleural airspace opacities are noted slightly progressed when compare with the prior exam. No sizable effusion is seen. No parenchymal nodules are noted. Upper Abdomen: Visualized upper abdomen is within normal limits. Musculoskeletal: No acute  rib abnormality is noted. Degenerative changes of the thoracic spine are seen. Review of the MIP images confirms the above findings. IMPRESSION: Persistent and slightly increased bilateral airspace opacities favoring multifocal infection as previously described No evidence of pulmonary emboli. No other focal abnormality is noted. Aortic Atherosclerosis (ICD10-I70.0) and Emphysema (ICD10-J43.9). Electronically Signed   By: Inez Catalina M.D.   On: 08/05/2022 22:56   DG Chest 2 View  Result Date: 08/05/2022 CLINICAL DATA:  Shortness of breath. EXAM: CHEST - 2 VIEW COMPARISON:  August 03, 2022.  May 12, 2022. FINDINGS: The heart size and mediastinal contours are within normal limits. Mildly increased reticular and patchy opacities are noted concerning for worsening atypical inflammation. The visualized skeletal structures are unremarkable. IMPRESSION: Mildly increased reticular and patchy lung opacities are noted as described above. Electronically Signed   By: Marijo Conception M.D.   On: 08/05/2022 16:38     Discharge Exam: Vitals:    08/08/22 0735 08/08/22 0736  BP:    Pulse: 72   Resp:    Temp:    SpO2: 98% 98%   Vitals:   08/08/22 0206 08/08/22 0609 08/08/22 0735 08/08/22 0736  BP:  (!) 92/51    Pulse:  76 72   Resp:      Temp:  98.4 F (36.9 C)    TempSrc:  Axillary    SpO2: 91% 98% 98% 98%  Weight:      Height:        General: Pt is alert, awake, not in acute distress Cardiovascular: RRR, S1/S2 +, no rubs, no gallops Respiratory: CTA bilaterally, no wheezing, no rhonchi, 3 L nasal cannula Abdominal: Soft, NT, ND, bowel sounds + Extremities: no edema, no cyanosis    The results of significant diagnostics from this hospitalization (including imaging, microbiology, ancillary and laboratory) are listed below for reference.     Microbiology: Recent Results (from the past 240 hour(s))  Resp panel by RT-PCR (RSV, Flu A&B, Covid) Anterior Nasal Swab     Status: None   Collection Time: 08/05/22  4:26 PM   Specimen: Anterior Nasal Swab  Result Value Ref Range Status   SARS Coronavirus 2 by RT PCR NEGATIVE NEGATIVE Final    Comment: (NOTE) SARS-CoV-2 target nucleic acids are NOT DETECTED.  The SARS-CoV-2 RNA is generally detectable in upper respiratory specimens during the acute phase of infection. The lowest concentration of SARS-CoV-2 viral copies this assay can detect is 138 copies/mL. A negative result does not preclude SARS-Cov-2 infection and should not be used as the sole basis for treatment or other patient management decisions. A negative result may occur with  improper specimen collection/handling, submission of specimen other than nasopharyngeal swab, presence of viral mutation(s) within the areas targeted by this assay, and inadequate number of viral copies(<138 copies/mL). A negative result must be combined with clinical observations, patient history, and epidemiological information. The expected result is Negative.  Fact Sheet for Patients:   EntrepreneurPulse.com.au  Fact Sheet for Healthcare Providers:  IncredibleEmployment.be  This test is no t yet approved or cleared by the [place] FDA and  has been authorized for detection and/or diagnosis of SARS-CoV-2 by FDA under an Emergency Use Authorization (EUA). This EUA will remain  in effect (meaning this test can be used) for the duration of the COVID-19 declaration under Section 564(b)(1) of the Act, 21 U.S.C.section 360bbb-3(b)(1), unless the authorization is terminated  or revoked sooner.       Influenza A by PCR NEGATIVE NEGATIVE  Final   Influenza B by PCR NEGATIVE NEGATIVE Final    Comment: (NOTE) The Xpert Xpress SARS-CoV-2/FLU/RSV plus assay is intended as an aid in the diagnosis of influenza from Nasopharyngeal swab specimens and should not be used as a sole basis for treatment. Nasal washings and aspirates are unacceptable for Xpert Xpress SARS-CoV-2/FLU/RSV testing.  Fact Sheet for Patients: EntrepreneurPulse.com.au  Fact Sheet for Healthcare Providers: IncredibleEmployment.be  This test is not yet approved or cleared by the [place] FDA and has been authorized for detection and/or diagnosis of SARS-CoV-2 by FDA under an Emergency Use Authorization (EUA). This EUA will remain in effect (meaning this test can be used) for the duration of the COVID-19 declaration under Section 564(b)(1) of the Act, 21 U.S.C. section 360bbb-3(b)(1), unless the authorization is terminated or revoked.     Resp Syncytial Virus by PCR NEGATIVE NEGATIVE Final    Comment: (NOTE) Fact Sheet for Patients: EntrepreneurPulse.com.au  Fact Sheet for Healthcare Providers: IncredibleEmployment.be  This test is not yet approved or cleared by the [place] FDA and has been authorized for detection and/or diagnosis of SARS-CoV-2 by FDA under an Emergency Use  Authorization (EUA). This EUA will remain in effect (meaning this test can be used) for the duration of the COVID-19 declaration under Section 564(b)(1) of the Act, 21 U.S.C. section 360bbb-3(b)(1), unless the authorization is terminated or revoked.  Performed at [facility], [address], [place], [place] [address]   Culture, blood (routine x 2)     Status: None (Preliminary result)   Collection Time: 08/05/22  8:37 PM   Specimen: Right Antecubital; Blood  Result Value Ref Range Status   Specimen Description   Final    RIGHT ANTECUBITAL BOTTLES DRAWN AEROBIC AND ANAEROBIC   Special Requests Blood Culture adequate volume  Final   Culture   Final    NO GROWTH 2 DAYS Performed at [facility], [address] [place], [place] [address]    Report Status PENDING  Incomplete  Culture, blood (routine x 2)     Status: None (Preliminary result)   Collection Time: 08/05/22  8:39 PM   Specimen: Site Not Specified; Blood  Result Value Ref Range Status   Specimen Description   Final    SITE NOT [place] AND ANAEROBIC   Special Requests Blood Culture adequate volume  Final   Culture   Final    NO GROWTH 2 DAYS Performed at [facility], [address], [place], [place] [address]    Report Status PENDING  Incomplete     Labs: BNP (last 3 results) No results for input(s): "BNP" in the last 8760 hours. Basic Metabolic Panel: Recent Labs  Lab 08/05/22 1640 08/06/22 0450 08/07/22 0445 08/08/22 0320  NA 141 141 143 141  K 3.1* 3.8 3.4* 3.4*  CL 106 109 109 108  CO2 '25 25 26 26  '$ GLUCOSE 116* 170* 104* 91  BUN 24* 22 24* 22  CREATININE 1.17* 0.89 0.88 0.93  CALCIUM 8.7* 8.5* 8.9 8.6*  MG 2.1 2.1 2.3 2.2   Liver Function Tests: Recent Labs  Lab 08/06/22 0450  AST 14*  ALT 16  ALKPHOS 93  BILITOT 0.5  PROT 6.8  ALBUMIN 2.6*   No results for input(s): "LIPASE", "AMYLASE" in the last 168 hours. No results for input(s): "AMMONIA" in the last 168  hours. CBC: Recent Labs  Lab 08/05/22 1640 08/06/22 0450 08/07/22 0445 08/08/22 0320  WBC 16.0* 13.8* 11.8* 10.9*  NEUTROABS  --  12.6*  --   --   HGB 10.8* 9.5* 9.6* 9.5*  HCT 35.3* 30.9* 32.2* 31.2*  MCV 101.1* 100.7* 102.9* 101.3*  PLT 246 201 207 206   Cardiac Enzymes: No results for input(s): "CKTOTAL", "CKMB", "CKMBINDEX", "TROPONINI" in the last 168 hours. BNP: Invalid input(s): "POCBNP" CBG: Recent Labs  Lab 08/07/22 0047 08/07/22 0745 08/07/22 1154 08/07/22 1644 08/08/22 0739  GLUCAP 137* 130* 123* 96 91   D-Dimer No results for input(s): "DDIMER" in the last 72 hours. Hgb A1c No results for input(s): "HGBA1C" in the last 72 hours. Lipid Profile No results for input(s): "CHOL", "HDL", "LDLCALC", "TRIG", "CHOLHDL", "LDLDIRECT" in the last 72 hours. Thyroid function studies No results for input(s): "TSH", "T4TOTAL", "T3FREE", "THYROIDAB" in the last 72 hours.  Invalid input(s): "FREET3" Anemia work up No results for input(s): "VITAMINB12", "FOLATE", "FERRITIN", "TIBC", "IRON", "RETICCTPCT" in the last 72 hours. Urinalysis    Component Value Date/Time   COLORURINE STRAW (A) 08/06/2022 0058   APPEARANCEUR CLEAR 08/06/2022 0058   LABSPEC >1.046 (H) 08/06/2022 0058   PHURINE 6.0 08/06/2022 0058   GLUCOSEU NEGATIVE 08/06/2022 0058   HGBUR SMALL (A) 08/06/2022 0058   HGBUR negative 11/17/2007 0955   BILIRUBINUR NEGATIVE 08/06/2022 0058   BILIRUBINUR NEG 06/13/2016 1344   KETONESUR 5 (A) 08/06/2022 0058   PROTEINUR 30 (A) 08/06/2022 0058   UROBILINOGEN 0.2 06/13/2016 1344   UROBILINOGEN 0.2 08/05/2014 1724   NITRITE NEGATIVE 08/06/2022 0058   LEUKOCYTESUR TRACE (A) 08/06/2022 0058   Sepsis Labs Recent Labs  Lab 08/05/22 1640 08/06/22 0450 08/07/22 0445 08/08/22 0320  WBC 16.0* 13.8* 11.8* 10.9*   Microbiology Recent Results (from the past 240 hour(s))  Resp panel by RT-PCR (RSV, Flu A&B, Covid) Anterior Nasal Swab     Status: None   Collection  Time: 08/05/22  4:26 PM   Specimen: Anterior Nasal Swab  Result Value Ref Range Status   SARS Coronavirus 2 by RT PCR NEGATIVE NEGATIVE Final    Comment: (NOTE) SARS-CoV-2 target nucleic acids are NOT DETECTED.  The SARS-CoV-2 RNA is generally detectable in upper respiratory specimens during the acute phase of infection. The lowest concentration of SARS-CoV-2 viral copies this assay can detect is 138 copies/mL. A negative result does not preclude SARS-Cov-2 infection and should not be used as the sole basis for treatment or other patient management decisions. A negative result may occur with  improper specimen collection/handling, submission of specimen other than nasopharyngeal swab, presence of viral mutation(s) within the areas targeted by this assay, and inadequate number of viral copies(<138 copies/mL). A negative result must be combined with clinical observations, patient history, and epidemiological information. The expected result is Negative.  Fact Sheet for Patients:  EntrepreneurPulse.com.au  Fact Sheet for Healthcare Providers:  IncredibleEmployment.be  This test is no t yet approved or cleared by the [place] FDA and  has been authorized for detection and/or diagnosis of SARS-CoV-2 by FDA under an Emergency Use Authorization (EUA). This EUA will remain  in effect (meaning this test can be used) for the duration of the COVID-19 declaration under Section 564(b)(1) of the Act, 21 U.S.C.section 360bbb-3(b)(1), unless the authorization is terminated  or revoked sooner.       Influenza A by PCR NEGATIVE NEGATIVE Final   Influenza B by PCR NEGATIVE NEGATIVE Final    Comment: (NOTE) The Xpert Xpress SARS-CoV-2/FLU/RSV plus assay is intended as an aid in the diagnosis of influenza from Nasopharyngeal swab specimens and should not be used as  a sole basis for treatment. Nasal washings and aspirates are unacceptable for Xpert Xpress  SARS-CoV-2/FLU/RSV testing.  Fact Sheet for Patients: EntrepreneurPulse.com.au  Fact Sheet for Healthcare Providers: IncredibleEmployment.be  This test is not yet approved or cleared by the [place] FDA and has been authorized for detection and/or diagnosis of SARS-CoV-2 by FDA under an Emergency Use Authorization (EUA). This EUA will remain in effect (meaning this test can be used) for the duration of the COVID-19 declaration under Section 564(b)(1) of the Act, 21 U.S.C. section 360bbb-3(b)(1), unless the authorization is terminated or revoked.     Resp Syncytial Virus by PCR NEGATIVE NEGATIVE Final    Comment: (NOTE) Fact Sheet for Patients: EntrepreneurPulse.com.au  Fact Sheet for Healthcare Providers: IncredibleEmployment.be  This test is not yet approved or cleared by the [place] FDA and has been authorized for detection and/or diagnosis of SARS-CoV-2 by FDA under an Emergency Use Authorization (EUA). This EUA will remain in effect (meaning this test can be used) for the duration of the COVID-19 declaration under Section 564(b)(1) of the Act, 21 U.S.C. section 360bbb-3(b)(1), unless the authorization is terminated or revoked.  Performed at [facility], [address], [place], [place] [address]   Culture, blood (routine x 2)     Status: None (Preliminary result)   Collection Time: 08/05/22  8:37 PM   Specimen: Right Antecubital; Blood  Result Value Ref Range Status   Specimen Description   Final    RIGHT ANTECUBITAL BOTTLES DRAWN AEROBIC AND ANAEROBIC   Special Requests Blood Culture adequate volume  Final   Culture   Final    NO GROWTH 2 DAYS Performed at [facility], [address] [place], [place] [address]    Report Status PENDING  Incomplete  Culture, blood (routine x 2)     Status: None (Preliminary result)   Collection Time: 08/05/22  8:39 PM   Specimen: Site Not Specified;  Blood  Result Value Ref Range Status   Specimen Description   Final    SITE NOT [place] AND ANAEROBIC   Special Requests Blood Culture adequate volume  Final   Culture   Final    NO GROWTH 2 DAYS Performed at [facility], [address], [place], [place] [address]    Report Status PENDING  Incomplete     Time coordinating discharge: 35 minutes  SIGNED:   Rodena Goldmann, DO [facility] 08/08/2022, 10:13 AM  If 7PM-7AM, please contact night-coverage [email or web address]

## 2022-08-08 NOTE — TOC Transition Note (Signed)
Transition of Care [facility]) - CM/SW Discharge Note   Patient Details  Name: [name] MRN: [id number] Date of Birth: 10-Feb-1956  [facility]) CM/SW Contact:  Ihor Gully, LCSW Phone Number: 08/08/2022, 1:45 PM   Clinical Narrative:    [facility] contacted to transport patient to her home.    Final next level of care: Home/Self Care Barriers to Discharge: No Barriers Identified   Patient Goals and CMS Choice      Discharge Placement                         Discharge Plan and Services Additional resources added to the After Visit Summary for                                       Social Determinants of Health (SDOH) Interventions SDOH Screenings   Food Insecurity: Food Insecurity Present (09/13/2021)  Housing: Low Risk  (09/12/2021)  Transportation Needs: No Transportation Needs (09/13/2021)  Alcohol Screen: Low Risk  (07/11/2021)  Depression (PHQ2-9): Medium Risk (05/24/2021)  Financial Resource Strain: High Risk (09/13/2021)  Physical Activity: Inactive (08/13/2021)  Social Connections: Moderately Integrated (08/13/2021)  Tobacco Use: High Risk (08/05/2022)     Readmission Risk Interventions     No data to display

## 2022-08-10 LAB — CULTURE, BLOOD (ROUTINE X 2)
Culture: NO GROWTH
Culture: NO GROWTH
Special Requests: ADEQUATE
Special Requests: ADEQUATE

## 2022-08-18 ENCOUNTER — Inpatient Hospital Stay: Payer: Medicaid Other | Admitting: Pulmonary Disease

## 2022-08-19 ENCOUNTER — Ambulatory Visit: Payer: Medicaid Other | Admitting: Nurse Practitioner

## 2022-08-26 NOTE — Progress Notes (Deleted)
Cardiology Clinic Note   Patient Name: [name] Date of Encounter: 08/26/2022  Primary Care Provider:  Olga Coaster, FNP Primary Cardiologist:  Dr. Gwenlyn [name]   Patient Profile    67 year old female with history of longstanding tobacco abuse, diastolic CHF, COPD with CPAP/BiPAP at night and oxygen 24/7 in the setting of OSA,, type 2 diabetes, hyperlipidemia, epilepsy.  Has not been seen by cardiology since 08/21/2021.  Past Medical History    Past Medical History:  Diagnosis Date   Allergic rhinitis    Arthritis    "hands and legs and feet" (04/27/2012)   Asthma    Cancer ([place]) 2005   rt arm mole rem cancer   Chronic bronchitis ([place])    "keep it all the time" (04/27/2012)   COPD (chronic obstructive pulmonary disease) ([place])    COVID-19 virus infection    03-2021   Depression    PT DENIES   Diverticulosis    DJD (degenerative joint disease)    Emphysema    Epilepsy (HCC)    Esophageal stricture    Exertional dyspnea    GERD (gastroesophageal reflux disease)    Glaucoma    H/O blood clots    Right leg   History of COVID-19 03/2021   Hyperlipidemia    Hypertension    IBS (irritable bowel syndrome)    Migraine    Neck pain    Cervical disc   OSA on CPAP    6-7 yrs; pt wears CPAP but has not been able to use it lately due to sinus issues   Oxygen deficiency    USES 3 LITERS 02 CONTINOUS   Pneumonia 2012; 04/27/2012   Recurrent upper respiratory infection (URI)    Right leg DVT ([place]) 1982   groin   Seizures ([place])    LAST SEIZURE 6 MONTHS OR  LONGER   Sleep apnea    Type II diabetes mellitus (HCC)    No meds since weight loss   UTI (urinary tract infection)    2 weeks ago   Vertigo 10/24/2014   Past Surgical History:  Procedure Laterality Date   ABDOMINAL HYSTERECTOMY  2001   "partial"   BIOPSY  04/16/2021   Procedure: BIOPSY;  Surgeon: Veronica Pole, MD;  Location: [facility];  Service: Endoscopy;;   CARPAL TUNNEL RELEASE  ~ 2008   right   CATARACT  EXTRACTION Bilateral    COLONOSCOPY     COLONOSCOPY WITH ESOPHAGOGASTRODUODENOSCOPY (EGD)     ESOPHAGOGASTRODUODENOSCOPY (EGD) WITH PROPOFOL N/A 04/16/2021   Procedure: ESOPHAGOGASTRODUODENOSCOPY (EGD) WITH PROPOFOL;  Surgeon: Veronica Pole, MD;  Location: [facility];  Service: Endoscopy;  Laterality: N/A;   KNEE ARTHROSCOPY  1990's   left   NASAL SEPTOPLASTY W/ TURBINOPLASTY Bilateral 05/23/2016   Procedure: NASAL SEPTOPLASTY WITH TURBINATE REDUCTION;  Surgeon: Veronica Belfast, MD;  Location: [place];  Service: ENT;  Laterality: Bilateral;   SHOULDER OPEN ROTATOR CUFF REPAIR  ~ 2010   left   SINOSCOPY     SINUS ENDO WITH FUSION Right 05/23/2016   Procedure: LIMITED RIGHT ENDOSCOPIC SINUS SURGERY;  Surgeon: Veronica Belfast, MD;  Location: [place];  Service: ENT;  Laterality: Right;   TOTAL KNEE ARTHROPLASTY  08/23/2012   right   TOTAL KNEE ARTHROPLASTY  08/23/2012   Procedure: TOTAL KNEE ARTHROPLASTY;  Surgeon: Veronica Junes, MD;  Location: [place];  Service: Orthopedics;  Laterality: Right;  RIGHT KNEE ARTHROPLASTY MEDIAL AND LATERAL COMPARTMENTS WITH PATELLA RESURFACING   TUBAL LIGATION  2001   UPPER GASTROINTESTINAL ENDOSCOPY      Allergies  Allergies  Allergen Reactions   Amoxicillin Anaphylaxis    Breakout, mouth swells   Azithromycin Swelling and Rash    "swelling all over, hives, thrush"   Cefdinir     Throat swelling after 3rd dose   Chantix [Varenicline] Other (See Comments)    "bad dreams, difficulty breathing"   Doxycycline Hyclate Anaphylaxis and Rash    swelling   Levofloxacin Anaphylaxis    Pt can take moxifloxacin   Penicillins Anaphylaxis, Shortness Of Breath and Rash    "ALMOST DIED, ended up in hospital"  PCN reaction causing immediate rash, facial/tongue/throat swelling, SOB or lightheadedness with hypotension: YES PCN reaction causing severe rash involving mucus membranes or skin necrosis: NO PCN reaction that required hospitalization YES Has patient  had a PCN reaction occurring within the last 10 years: NO    Sulfonamide Derivatives Shortness Of Breath, Swelling and Rash    "break out from head to toe"   Toradol [Ketorolac Tromethamine] Other (See Comments)    Seizure    Clarithromycin Hives    swelling   Nortriptyline Swelling, Rash and Other (See Comments)    Per patient had "kidney problems" on this medication, could not use bathroom (urinary retention)   Paxlovid [Nirmatrelvir-Ritonavir] Swelling    Throat swelling   Codeine Nausea And Vomiting   Fluticasone-Salmeterol Other (See Comments)    REACTION: ulcers in mouth   Triptans Rash    Mouth breaks out and start itching    History of Present Illness    [name] is seen today for ongoing assessment and management of hypertension with cardiovascular risk factors to include ongoing tobacco abuse, hyperlipidemia.  Recent discharge from the hospital on 08/08/2022 after being admitted for metabolic encephalopathy and hypoxic respiratory failure in the setting of multifocal community-acquired pneumonia.  Home Medications    Current Outpatient Medications  Medication Sig Dispense Refill   albuterol (PROAIR HFA) 108 (90 Base) MCG/ACT inhaler INHALE TWO PUFFS INTO THE LUNGS EVERY 6 HOURS AS NEEDED FOR SHORTNESS OF BREATH 18 g 6   albuterol (PROVENTIL) (2.5 MG/3ML) 0.083% nebulizer solution USE 1 VIAL IN NEBULIZER EVERY 6 HOURS AS NEEDED FOR SHORTNESS OF BREATH 75 mL 8   ALLERGY RELIEF 10 MG tablet TAKE ONE TABLET BY MOUTH DAILY 90 tablet 1   allopurinol (ZYLOPRIM) 100 MG tablet Take 100 mg by mouth daily.     ALPRAZolam (XANAX) 0.25 MG tablet Take 0.25 mg by mouth 2 (two) times daily.     aspirin EC 81 MG tablet Take 81 mg by mouth daily. Swallow whole.     Azelastine HCl (ASTEPRO) 0.15 % SOLN Place 1 spray into the nose 2 (two) times daily as needed (Allergies). 30 mL 5   Cholecalciferol (VITAMIN D3) 50 MCG (2000 UT) capsule TAKE ONE CAPSULE BY MOUTH DAILY 90 capsule 1    cyclobenzaprine (FLEXERIL) 10 MG tablet Take 10 mg by mouth at bedtime as needed for muscle spasms.     famotidine (PEPCID) 40 MG tablet Take 40 mg by mouth at bedtime.     ferrous sulfate 324 (65 Fe) MG TBEC TAKE ONE TABLET BY MOUTH EVERY OTHER DAY 90 tablet 5   Fluticasone-Umeclidin-Vilant (TRELEGY ELLIPTA) 100-62.5-25 MCG/ACT AEPB Inhale 1 puff into the lungs daily in the afternoon. 60 each 5   gabapentin (NEURONTIN) 100 MG capsule Take 1 capsule (100 mg total) by mouth at bedtime. (Patient taking differently: Take 100 mg by mouth  3 (three) times daily.) 90 capsule 3   guaiFENesin (MUCINEX) 600 MG 12 hr tablet Take 2 tablets (1,200 mg total) by mouth 2 (two) times daily as needed. 60 tablet 5   ibuprofen (ADVIL) 600 MG tablet Take 600 mg by mouth every 8 (eight) hours as needed for mild pain.     isosorbide mononitrate (IMDUR) 60 MG 24 hr tablet Take 60 mg by mouth 2 (two) times daily.     lamoTRIgine (LAMICTAL) 200 MG tablet TAKE 1 AND 1/2 TABLETS BY MOUTH TWICE DAILY 270 tablet 1   montelukast (SINGULAIR) 10 MG tablet TAKE ONE TABLET BY MOUTH AT BEDTIME 30 tablet 5   OXYGEN Inhale 3 L into the lungs continuous.     pantoprazole (PROTONIX) 40 MG tablet Take 1 tablet (40 mg total) by mouth 2 (two) times daily. **PLEASE CALL OFFICE TO SCHEDULE FOLLOW UP 60 tablet 0   QUEtiapine (SEROQUEL) 100 MG tablet TAKE ONE TABLET BY MOUTH AT BEDTIME 30 tablet 5   simvastatin (ZOCOR) 40 MG tablet TAKE ONE TABLET BY MOUTH AT BEDTIME 90 tablet 2   tiZANidine (ZANAFLEX) 4 MG tablet TAKE ONE TABLET BY MOUTH EVERY 6 HOURS AS NEEDED FOR MUSCLE SPASMS FOR UP TO 30 DOSES 15 tablet 1   topiramate (TOPAMAX) 100 MG tablet Take 200 mg twice daily (Patient not taking: Reported on 08/06/2022) 360 tablet 3   traZODone (DESYREL) 50 MG tablet Take 50 mg by mouth at bedtime.     XARELTO 20 MG TABS tablet Take 20 mg by mouth daily. (Patient not taking: Reported on 08/06/2022)     No current facility-administered medications  for this visit.     Family History    Family History  Problem Relation Age of Onset   Diabetes Mother    Lung cancer Mother        was a smoker   Stroke Mother    Alcohol abuse Father    Lung cancer Father        was a smoker   Emphysema Father        was a smoker   Heart disease Sister    Other Sister        blood clotting disorder   Diabetes Brother    Seizures Brother    Diabetes Maternal Aunt    Breast cancer Maternal Aunt    Esophageal cancer Maternal Uncle    Diabetes Maternal Uncle    Heart disease Maternal Grandfather    Diabetes Maternal Grandfather    Bipolar disorder Son    Muscular dystrophy Son    Heart disease Son    Heart disease Other        uncle   Colon cancer Neg Hx    Pancreatic cancer Neg Hx    Stomach cancer Neg Hx    Colon polyps Neg Hx    Rectal cancer Neg Hx    She indicated that her mother is deceased. She indicated that her father is deceased. She indicated that both of her sisters are alive. She indicated that her brother is alive. She indicated that the status of her maternal grandfather is unknown. She indicated that the status of her maternal aunt is unknown. She indicated that the status of her maternal uncle is unknown. She indicated that the status of her neg hx is unknown. She indicated that the status of her other is unknown.  Social History    Social History   Socioeconomic History   Marital status: Married  Spouse name: [name]   Number of children: 2   Years of education: 12   Highest education level: Not on file  Occupational History   Occupation: disabled  Tobacco Use   Smoking status: Every Day    Packs/day: 1.00    Years: 40.00    Total pack years: 40.00    Types: Cigarettes    Start date: 08/12/1975    Last attempt to quit: 12/30/2020    Years since quitting: 1.6   Smokeless tobacco: Never   Tobacco comments:    1-2 CIGS A DAY NOW   Vaping Use   Vaping Use: Never used  Substance and Sexual Activity   Alcohol  use: No    Alcohol/week: 0.0 standard drinks of alcohol   Drug use: No   Sexual activity: Never  Other Topics Concern   Not on file  Social History Narrative   Patient lives at home with husband [name], her son and his wife.    Patient has 2 children.    Patient has a high school education.    Patient is currently unemployed.    Patient is right handed.    Patient drinks 2 cups of caffeine per day.   Social Determinants of Health   Financial Resource Strain: High Risk (09/13/2021)   Overall Financial Resource Strain (CARDIA)    Difficulty of Paying Living Expenses: Very hard  Food Insecurity: Food Insecurity Present (09/13/2021)   Hunger Vital Sign    Worried About Running Out of Food in the Last Year: Often true    Ran Out of Food in the Last Year: Often true  Transportation Needs: No Transportation Needs (09/13/2021)   PRAPARE - Hydrologist (Medical): No    Lack of Transportation (Non-Medical): No  Physical Activity: Inactive (08/13/2021)   Exercise Vital Sign    Days of Exercise per Week: 0 days    Minutes of Exercise per Session: 0 min  Stress: Not on file  Social Connections: Moderately Integrated (08/13/2021)   Social Connection and Isolation Panel [NHANES]    Frequency of Communication with Friends and Family: More than three times a week    Frequency of Social Gatherings with Friends and Family: More than three times a week    Attends Religious Services: Never    Marine scientist or Organizations: Yes    Attends Archivist Meetings: Never    Marital Status: Married  Human resources officer Violence: Not on file     Review of Systems    General:  No chills, fever, night sweats or weight changes.  Cardiovascular:  No chest pain, dyspnea on exertion, edema, orthopnea, palpitations, paroxysmal nocturnal dyspnea. Dermatological: No rash, lesions/masses Respiratory: No cough, dyspnea Urologic: No hematuria, dysuria Abdominal:   No nausea,  vomiting, diarrhea, bright red blood per rectum, melena, or hematemesis Neurologic:  No visual changes, wkns, changes in mental status. All other systems reviewed and are otherwise negative except as noted above.     Physical Exam    VS:  There were no vitals taken for this visit. , BMI There is no height or weight on file to calculate BMI.     GEN: Well nourished, well developed, in no acute distress. HEENT: normal. Neck: Supple, no JVD, carotid bruits, or masses. Cardiac: RRR, no murmurs, rubs, or gallops. No clubbing, cyanosis, edema.  Radials/DP/PT 2+ and equal bilaterally.  Respiratory:  Respirations regular and unlabored, clear to auscultation bilaterally. GI: Soft, nontender, nondistended, BS +  x 4. MS: no deformity or atrophy. Skin: warm and dry, no rash. Neuro:  Strength and sensation are intact. Psych: Normal affect.  Accessory Clinical Findings    ECG personally reviewed by me today- *** - No acute changes  Lab Results  Component Value Date   WBC 10.9 (H) 08/08/2022   HGB 9.5 (L) 08/08/2022   HCT 31.2 (L) 08/08/2022   MCV 101.3 (H) 08/08/2022   PLT 206 08/08/2022   Lab Results  Component Value Date   CREATININE 0.93 08/08/2022   BUN 22 08/08/2022   NA 141 08/08/2022   K 3.4 (L) 08/08/2022   CL 108 08/08/2022   CO2 26 08/08/2022   Lab Results  Component Value Date   ALT 16 08/06/2022   AST 14 (L) 08/06/2022   ALKPHOS 93 08/06/2022   BILITOT 0.5 08/06/2022   Lab Results  Component Value Date   CHOL 184 08/21/2021   HDL 91 08/21/2021   LDLCALC 75 08/21/2021   LDLDIRECT 77 09/14/2012   TRIG 106 08/21/2021   CHOLHDL 2.0 08/21/2021    Lab Results  Component Value Date   HGBA1C 5.5 05/16/2020    Review of Prior Studies: Echocardiogram 09/03/2021  1. Left ventricular ejection fraction, by estimation, is 60 to 65%. The  left ventricle has normal function. The left ventricle has no regional  wall motion abnormalities. Left ventricular diastolic  parameters are  consistent with Grade I diastolic  dysfunction (impaired relaxation). The average left ventricular global  longitudinal strain is -26.9 %. The global longitudinal strain is normal.   2. Right ventricular systolic function is normal. The right ventricular  size is normal.   3. The mitral valve is normal in structure. No evidence of mitral valve  regurgitation. No evidence of mitral stenosis.   4. The aortic valve is normal in structure. Aortic valve regurgitation is  not visualized. No aortic stenosis is present.   5. The inferior vena cava is normal in size with greater than 50%  respiratory variability, suggesting right atrial pressure of 3 mmHg.   Assessment & Plan   1.  ***    Current medicines are reviewed at length with the patient today.  I have spent *** min's  dedicated to the care of this patient on the date of this encounter to include pre-visit review of records, assessment, management and diagnostic testing,with shared decision making. Signed, Phill Myron. West Pugh, ANP, [place]   08/26/2022 12:34 PM      Office [phone] Fax [phone]  Notice: This dictation was prepared with Dragon dictation along with smaller phrase technology. Any transcriptional errors that result from this process are unintentional and may not be corrected upon review.

## 2022-08-29 ENCOUNTER — Ambulatory Visit: Payer: Medicaid Other | Admitting: Adult Health

## 2022-09-05 ENCOUNTER — Ambulatory Visit: Payer: Medicaid Other | Admitting: Nurse Practitioner

## 2022-09-12 ENCOUNTER — Other Ambulatory Visit: Payer: Self-pay | Admitting: Neurology

## 2022-09-23 ENCOUNTER — Ambulatory Visit: Payer: Medicaid Other | Admitting: Nurse Practitioner

## 2022-09-25 ENCOUNTER — Encounter: Payer: Self-pay | Admitting: Internal Medicine

## 2022-09-25 ENCOUNTER — Ambulatory Visit (INDEPENDENT_AMBULATORY_CARE_PROVIDER_SITE_OTHER): Payer: Medicaid Other | Admitting: Internal Medicine

## 2022-09-25 VITALS — BP 136/88 | HR 82 | Ht 61.0 in | Wt 174.0 lb

## 2022-09-25 DIAGNOSIS — J9611 Chronic respiratory failure with hypoxia: Secondary | ICD-10-CM | POA: Diagnosis not present

## 2022-09-25 DIAGNOSIS — J449 Chronic obstructive pulmonary disease, unspecified: Secondary | ICD-10-CM

## 2022-09-25 DIAGNOSIS — J690 Pneumonitis due to inhalation of food and vomit: Secondary | ICD-10-CM | POA: Diagnosis not present

## 2022-09-25 NOTE — Progress Notes (Signed)
Patient planning to get CXR Friday morning 09/26/2022

## 2022-09-25 NOTE — Patient Instructions (Addendum)
Plan A = Automatic = Always=    Trelegy [id number] one click each am    Plan B = Backup (to supplement plan A, not to replace it) Only use your albuterol inhaler as a rescue medication to be used if you can't catch your breath by resting or doing a relaxed purse lip breathing pattern.  - The less you use it, the better it will work when you need it. - Ok to use the inhaler up to 2 puffs every 4 hours if you must but call for appointment if use goes up over your usual need - Don't leave home without it !!  (think of it like the spare tire for your car)   Plan C = Crisis (instead of Plan B but only if Plan B stops working) - only use your albuterol nebulizer if you first try Plan B and it fails to help > ok to use the nebulizer up to every 4 hours but if start needing it regularly call for immediate appointment  Also  Ok to try albuterol 15 min before an activity (on alternating days)  that you know would usually make you short of breath and see if it makes any difference and if makes none then don't take albuterol after activity unless you can't catch your breath as this means it's the resting that helps, not the albuterol.  After use of Trelegy use arm and hammer tooth paste   Call your pharmacist to fine out what the antibiotic was that could tolerate that actually worked   Make sure you check your oxygen saturation  AT  your highest level of activity (not after you stop)   to be sure it stays over 90% and adjust  02 flow upward to maintain this level if needed but remember to turn it back to previous settings when you stop (to conserve your supply).   The key is to stop smoking completely before smoking completely stops you!  For cough/ congestion > mucinex 1200 mg every 12hours as need and flutter valve as much as possible  and if can't stop coughing try to switch entirely to the nebulizer up to every 4 hours as needed   Keep appt with Dr Halford Chessman  - call sooner if needed

## 2022-09-25 NOTE — Progress Notes (Signed)
Subjective:   Patient ID: [name], female    DOB: June 10, 1956 MRN: [id number]  HPI  23 yowf active smoker with dx of copd aware of sob around 2000 [facility] with exacerbation every few months just disharged from [facility] in 04/2012 Veronica ? CAP with aecopd referred by Dr Para March Veronica preop Veronica R TKR Jan 13 14   06/18/2012 Pulmonary preop cc doe x one aisle and since last flare using inhaler saba maybe twice weekly and sleeping ok on cpap maintained on spiriva and can't take advair due to mouth irritation.  Min am cough/ congestion with thick white mucus.  rec Try to minimize smoking Try tudorza one puff twice daily instead of spiriva on trial basis Veronica 2 weeks - call if you like it better and we'll send you a prescription     08/06/2012 f/u ov/[name] still smoking cc not limitedby breathing but by knees,  thinks tudorza works better but only using once a day. >>no changes   09/10/2012 [name], [name], [name], [name] bronchodilators, and Veronica steroids. She was encouraged on smoking cessation. She was discharged on oxygen therapy with activity. At 2 L. Since discharge. Patient reports that she is feeling much improved with decreased shortness of breath. She denies any cough, fever, or hemoptysis, orthopnea, PND, or increased leg swelling. She has not smoked since January 13. She was congratulated on smoking cessation. Chest x-ray today does show improved aeration. rec Keep up good work with not smoking  Continue on current regimen.  Wear O2 with activity and  As needed   Follow up Dr. Melvyn Novas  In 2 weeks and As needed   > did not return as requested            05/12/2016  f/u ov/[name] re:  preop eval Veronica sinus surgery / no med calendar  Chief Complaint  Patient presents with   Follow-up    Pt. says she is not having any sob, coughing with green phlegm, no chest pain  very poor insight into meds / still smoking/ main problem is am cough/ congestion not using mucinex / flutter as rec  Doe = MMRC2 = can't walk a nl pace on a flat grade s sob but does fine slow and flat eg shopping Rec  Work on inhaler technique:  relax and gently blow all the way out then take a nice smooth deep breath back in, triggering the inhaler at same time you start breathing in.  Hold Veronica up to 5 seconds if you can. Blow out thru nose. Rinse and gargle with water when done Plan A = Automatic = dulera 200 Take 2 puffs first thing in am and then another 2 puffs about 12 hours later.  Plan B = Backup Only use your albuterol (proair) as a rescue medication  Plan C = Crisis - only use your albuterol nebulizer if you first try Plan B and it fails to help > ok to use the nebulizer up to every 4 hours but if start needing it regularly call Veronica immediate appointment Pulmonary follow up is as needed - bring your medications with you  [facility] Admit: Admit date: 09/18/2022 Discharge date: 09/21/2022 Principal Problem: Pneumonia of both lungs due to infectious organism COPD (chronic obstructive pulmonary disease) (CMS-HCC) Chronic diastolic heart failure (CMS-HCC) Pulmonary hypertension (CMS-HCC) HLD (hyperlipidemia) Seizure disorder (CMS-HCC) Altered mental status Tobacco dependence Age-related physical debility Obstructive sleep apnea Class 2 obesity with body mass index (BMI) of 35.0 to 35.9 in adult Abnormal finding on urinalysis Acute deep vein thrombosis (DVT) of left lower extremity (CMS-HCC)  Hospital Course:  admitted Veronica Pneumonia of both lungs due to infectious  organism on 09/18/2022   [name] is a 67 y.o. female with PMHx as reviewed in the EMR that presented to [facility] and is being admitted Veronica Pneumonia of both lungs due to infectious organism. Patient brought to the ED by EMS today after being found unresponsive by her family. Complains of cough and shortness of breath. On arrival patient's pO2 was 49. Patient also notes she has been having difficulty urinating the past couple of days with scant urine. Wears oxygen at home, 3 L. CPAP at night.Past medical history includes COPD, diastolic heart failure, "bad knees", chronic neck and back pain, home oxygen use, GERD, seizure disorder, hyperlipidemia. [facility]. She was recently hospitalized at [facility] 12/26 through 12/29 with pneumonia. Today's findings include elevated WBC 19.2, creatinine 1.23, pO2 49. Chest x-ray  c/w chronic pulmonary infiltrates, unchanged. CTA of the chest shows multifocal alveolar process likely infectious. Larger area of alveolar consolidation in the left upper lobe. Nonspecific hilar and mediastinal adenopathy. Viral testing negative. Temperature 103.7, tachycardic 120s. Patient has clinically improved, has been treated with [name] including vancomycin, and aztreonam. Patient has been evaluated by PT, recommended SNF, however patient would like to be discharged home states that her husband and daughter can care Veronica her.  Detailed hospital close/management mentioned below:  Pneumonia of both lungs due to infectious organism, leukocytosis, COPD, sleep apnea Clinically improving, speaking complete sentences. WBC was trended down to 7.8. Blood cultures x 2 no growth 48 hours. Continue vancomycin and aztreonam that were initiated in the Acapella Veronica pulmonary toiletry. Will discharge on clindamycin 450 mg every 8 hours x 5 days. Oxygen support, has been refusing CPAP at night Nebulizers every 6 hours  Chronic diastolic heart  failure [name] [name] Echocardiogram 1/23 normal EF 60 to 65% with G1 DD She is not on home diuretics  Left lower extremity DVT Continue Xarelto Doppler 2/9 confirms DVT in the left common femoral vein Will need outpt Vascular Clinic Referral       09/25/2022  re-establish s/p Admit: f/u ov/[name]/[name] re: GOLD 3 copd / 02 dep and still smoking  maint on telegy  / DNI status per last admit.  Chief Complaint  Patient presents with   Hospitalization Follow-up    [facility] pneumonia and sepsis  AP 12/26-12/29 COPD    Dyspnea:  unsteady on feet/ clot L leg x one year and leg stayed swollen since   Cough: slt slt cream  Sleeping: hosp bed 10 degrees  SABA use: way too much 02: 3lpm 24/7      No obvious day to day or daytime variability or assoc mucus plugs or hemoptysis or cp or chest tightness, subjective wheeze or overt sinus or hb symptoms.   Sleeping as above without nocturnal  or early am exacerbation  of respiratory  c/o's or need Veronica noct saba. Also denies any obvious fluctuation of symptoms  with weather or environmental changes or other aggravating or alleviating factors except as outlined above   No unusual exposure hx or h/o childhood pna/ asthma or knowledge of premature birth.  Current Allergies, Complete Past Medical History, Past Surgical History, Family History, and Social History were reviewed in [employer] record.  ROS  The following are not active complaints unless bolded Hoarseness, sore throat, dysphagia, dental problems, itching, sneezing,  nasal congestion or discharge of excess mucus or purulent secretions, ear ache,   fever, chills, sweats, unintended wt loss or wt gain, classically pleuritic or exertional cp,  orthopnea pnd or arm/hand swelling  or leg swelling, presyncope, palpitations, abdominal pain, anorexia, nausea, vomiting, diarrhea  or change in bowel habits or change in  bladder habits, change in stools or change in urine, dysuria, hematuria,  rash, arthralgias, visual complaints, headache, numbness, weakness or ataxia or problems with walking or coordination,  change in mood or  memory.          Current Meds  Medication Sig   albuterol (PROAIR HFA) 108 (90 Base) MCG/ACT inhaler INHALE TWO PUFFS INTO THE LUNGS EVERY 6 HOURS AS NEEDED Veronica SHORTNESS OF BREATH   albuterol (PROVENTIL) (2.5 MG/3ML) 0.083% nebulizer solution USE 1 VIAL IN NEBULIZER EVERY 6 HOURS AS NEEDED Veronica SHORTNESS OF BREATH   ALLERGY RELIEF 10 MG tablet TAKE ONE TABLET BY MOUTH DAILY   allopurinol (ZYLOPRIM) 100 MG tablet Take 100 mg by mouth daily.   ALPRAZolam (XANAX) 0.25 MG tablet Take 0.25 mg by mouth 2 (two) times daily.   aspirin EC 81 MG tablet Take 81 mg by mouth daily. Swallow whole.   Azelastine HCl (ASTEPRO) 0.15 % SOLN Place 1 spray into the nose 2 (two) times daily as needed (Allergies).   Cholecalciferol (VITAMIN D3) 50 MCG (2000 UT) capsule TAKE ONE CAPSULE BY MOUTH DAILY   cyclobenzaprine (FLEXERIL) 10 MG tablet Take 10 mg by mouth at bedtime as needed Veronica muscle spasms.   famotidine (PEPCID) 40 MG tablet Take 40 mg by mouth at bedtime.   ferrous sulfate 324 (65 Fe) MG TBEC TAKE ONE TABLET BY MOUTH EVERY OTHER DAY   Fluticasone-Umeclidin-Vilant (TRELEGY ELLIPTA) 100-62.5-25 MCG/ACT AEPB Inhale 1 puff into the lungs daily in the afternoon.   gabapentin (NEURONTIN) 100 MG capsule Take 1 capsule (100 mg total) by mouth at bedtime. (Patient taking differently: Take 100 mg by mouth 3 (three) times daily.)   guaiFENesin (MUCINEX) 600 MG 12 hr tablet Take 2 tablets (1,200 mg total) by mouth 2 (two) times daily as needed.   ibuprofen (ADVIL) 600 MG tablet Take 600 mg by mouth every 8 (eight) hours as needed Veronica mild pain.   isosorbide mononitrate (IMDUR) 60 MG 24 hr tablet Take 60 mg by mouth 2 (two) times daily.   lamoTRIgine (LAMICTAL) 200 MG tablet TAKE 1 AND 1/2 TABLETS BY MOUTH  TWICE DAILY   montelukast (SINGULAIR) 10 MG tablet TAKE ONE TABLET BY MOUTH AT BEDTIME   OXYGEN Inhale 3 L into the lungs continuous.   pantoprazole (PROTONIX) 40 MG tablet Take 1 tablet (40 mg total) by mouth 2 (two) times daily. **PLEASE CALL [name] TO SCHEDULE FOLLOW UP   QUEtiapine (SEROQUEL) 100 MG tablet TAKE ONE TABLET BY MOUTH AT BEDTIME   simvastatin (ZOCOR) 40 MG tablet TAKE ONE TABLET BY MOUTH AT BEDTIME   tiZANidine (ZANAFLEX) 4 MG tablet TAKE ONE TABLET BY MOUTH EVERY 6 HOURS AS NEEDED Veronica MUSCLE SPASMS Veronica UP TO 30 DOSES  topiramate (TOPAMAX) 100 MG tablet Take 200 mg twice daily   traZODone (DESYREL) 50 MG tablet Take 50 mg by mouth at bedtime.   XARELTO 20 MG TABS tablet Take 20 mg by mouth daily.                Objective:   Physical Exam    Wt Readings from Last 3 Encounters:  09/25/22 174 lb (78.9 kg)  08/07/22 177 lb 14.6 oz (80.7 kg)  07/10/22 176 lb (79.8 kg)      Vital signs reviewed  09/25/2022  - Note at rest 02 sats  98% on 3lpm cont    General appearance:    w/c bound eldery wf > stated age     39 : Oropharynx  clear   Nasal turbinates nl    NECK :  without  apparent JVD/ palpable Nodes/TM    LUNGS: no acc muscle use,  Mild barrel  contour chest wall with bilateral  Distant bs s audible wheeze and  without cough on insp or exp maneuvers  and mild  Hyperresonant  to  percussion bilaterally     CV:  RRR  no s3 or murmur or increase in P2, and no edema   ABD:  soft and nontender    MS:  Nl gait/ ext warm without deformities Or obvious joint restrictions  calf tenderness, cyanosis or clubbing     SKIN: warm and dry without lesions    NEURO:  alert, approp, nl sensorium with  no motor or cerebellar deficits apparent.      I personally reviewed images and agree with radiology impression as follows:   Chest CT with contreast   09/18/22 [facility] 1. No PE, aneurysm or dissection identified.  2. Multifocal alveolar process likely infectious  etiology. Larger  area of alveolar consolidation in the left upper lobe.   3. Nonspecific hilar and mediastinal adenopathy.  4. Incidental partially imaged left sided nephrolithiasis.  My review:  overall improved vs prior CTa 08/05/22 from [facility] ecept Veronica the LUL as dz which is still quite a small area  09/25/2022 rec cxr but not done     Lab Results  Component Value Date   HGB 9.5 (L) 08/08/2022   HGB 9.6 (L) 08/07/2022   HGB 9.5 (L) 08/06/2022   HGB 10.1 (L) 05/24/2021   HGB 11.6 04/10/2020   HGB 11.8 04/05/2019       Assessment & Plan:

## 2022-09-25 NOTE — Assessment & Plan Note (Signed)
Active smoker  -  PFT's  09/27/21  FEV1 1.05 (47 % ) ratio 0.58  p 5 % improvement from saba p ? prior to study with DLCO  9.41 (52%)   and FV curve mildly concave and ERV 51% at wt 189    Group D (now reclassified as E) in terms of symptom/risk and laba/lama/ICS  therefore appropriate rx at this point >>>  trelegy plus approp saba

## 2022-09-25 NOTE — Assessment & Plan Note (Signed)
Active smoker  - as of 09/25/22 on 3lpm 247 with nl HC03 but hct 28%   At  this point the chronic anemia is more a problem than hypoxemia as the latter is easily correctable on supplemental 02 > deferred w/u To PCP noting it is longstanding and normocytic in nature and likely due to chronic dz   Advised: Make sure you check your oxygen saturation  AT  your highest level of activity (not after you stop)   to be sure it stays over 90% and adjust  02 flow upward to maintain this level if needed but remember to turn it back to previous settings when you stop (to conserve your supply).   F/u with Dr Halford Chessman as planned in one week.           Each maintenance medication was reviewed in detail including emphasizing most importantly the difference between maintenance and prns and under what circumstances the prns are to be triggered using an action plan format where appropriate.  Total time for H and P, chart review, counseling, reviewing dpi/ 02 /hfa/neb  device(s) and generating customized AVS unique to this office visit / same day charting  > 40 min post post pt not seen by me in > 4  years

## 2022-09-26 ENCOUNTER — Telehealth: Payer: Self-pay | Admitting: Internal Medicine

## 2022-09-26 ENCOUNTER — Ambulatory Visit (HOSPITAL_COMMUNITY)
Admission: RE | Admit: 2022-09-26 | Discharge: 2022-09-26 | Disposition: A | Discharge status: Home / Self Care | Payer: Medicaid Other | Source: Ambulatory Visit | Attending: Internal Medicine | Admitting: Internal Medicine

## 2022-09-26 DIAGNOSIS — J690 Pneumonitis due to inhalation of food and vomit: Secondary | ICD-10-CM | POA: Diagnosis present

## 2022-09-29 ENCOUNTER — Telehealth: Payer: Self-pay | Admitting: Neurology

## 2022-09-29 ENCOUNTER — Other Ambulatory Visit: Payer: Self-pay

## 2022-09-29 MED ORDER — TOPIRAMATE 100 MG PO TABS: ORAL_TABLET | ORAL | 2 refills | Status: DC

## 2022-09-29 NOTE — Telephone Encounter (Signed)
Refill has been sent.  °

## 2022-09-29 NOTE — Telephone Encounter (Signed)
Dr. Melvyn Novas, please advise do you have patients CXR results available for [place] to give her?

## 2022-09-29 NOTE — Telephone Encounter (Signed)
Sorry, Not read yet, will send result note when it is

## 2022-09-29 NOTE — Telephone Encounter (Signed)
Pt is calling requesting a refill on topiramate (TOPAMAX) 100 MG tablet. Should  be sent to [facility]

## 2022-10-02 ENCOUNTER — Ambulatory Visit: Payer: Medicaid Other | Admitting: Pulmonary Disease

## 2022-10-02 ENCOUNTER — Ambulatory Visit (INDEPENDENT_AMBULATORY_CARE_PROVIDER_SITE_OTHER): Payer: Medicaid Other | Admitting: Pulmonary Disease

## 2022-10-02 ENCOUNTER — Encounter: Payer: Self-pay | Admitting: Pulmonary Disease

## 2022-10-02 VITALS — BP 132/88 | HR 82 | Ht 61.0 in | Wt 174.0 lb

## 2022-10-02 DIAGNOSIS — J189 Pneumonia, unspecified organism: Secondary | ICD-10-CM | POA: Diagnosis not present

## 2022-10-02 DIAGNOSIS — J9611 Chronic respiratory failure with hypoxia: Secondary | ICD-10-CM | POA: Diagnosis not present

## 2022-10-02 DIAGNOSIS — Z720 Tobacco use: Secondary | ICD-10-CM | POA: Diagnosis not present

## 2022-10-02 DIAGNOSIS — J411 Mucopurulent chronic bronchitis: Secondary | ICD-10-CM

## 2022-10-02 NOTE — Progress Notes (Signed)
[facility]  Chief Complaint  Patient presents with   Follow-up    Is not using bipap  Has a cough with phlegm  Was seen by Dr. Melvyn Novas last week      Past Surgical History:  She  has a past surgical history that includes Tubal ligation (2001); Knee arthroscopy (1990's); Carpal tunnel release (~ 2008); Shoulder open rotator cuff repair (~ 2010); Abdominal hysterectomy (2001); Total knee arthroplasty (08/23/2012); Total knee arthroplasty (08/23/2012); Cataract extraction (Bilateral); Colonoscopy with esophagogastroduodenoscopy (egd); Nasal septoplasty w/ turbinoplasty (Bilateral, 05/23/2016); Sinus endo with fusion (Right, 05/23/2016); Colonoscopy; Upper gastrointestinal endoscopy; Esophagogastroduodenoscopy (egd) with propofol (N/A, 04/16/2021); biopsy (04/16/2021); and Sinoscopy.  Past Medical History:  OA, COVID 29 March 2021, Diverticulosis, Epilepsy, GERD, Glaucoma, Rt leg DVT, HLD, HTN, IBS, Migraine headache, PNA, DM type 2  Constitutional:  BP 132/88   Pulse 82   Ht 5' 1"$  (1.549 m)   Wt 174 lb (78.9 kg) Comment: reported  SpO2 97% Comment: 3LO2 cont  BMI 32.88 kg/m   Brief Summary:  [name] is a 67 y.o. female smoker with COPD, obstructive sleep apnea, and chronic respiratory failure.      Subjective:   She was in hospital in December and February with pneumonia.  Chest xray from 09/30/22 showed resolution of infiltrate.  Still smoking few cigarettes per day.  Has cough with yellow sputum.  Albuterol helps.  No issue using trelegy.  Uses 3 liters 24/7.  Sleeping okay.  Astral made her feel smothered and she stopped using this.       Physical Exam:   Appearance - well kempt, sitting in scooter, wearing oxygen  ENMT - no sinus tenderness, no oral exudate, no LAN, Mallampati 2 airway, no stridor  Respiratory - decreased breath sounds bilaterally, no wheezing or rales  CV - s1s2 regular rate and rhythm, no murmurs  Ext  - no clubbing, no edema  Skin - no rashes  Psych - normal mood and affect      Pulmonary testing:  Spirometry 10/27/13 >> FEV1 1.51 (66%), FEV1% 68 PFT 09/27/21 >> FEV1 1.05 (47%), FEV1% 58. TLC 3.51 (76%), DLCO 52% ABT 04/20/22 >> pH 7.32, PCO2 51.1, PO2 92  Chest Imaging:  HRCT chest 11/07/15 >> upper lobe scarring CT chest 05/11/21 >> centrilobular and paraseptal emphysema CT chest 05/12/22 >> moderate emphysema, apical scarring, patchy opacities  Sleep Tests:  ONO with CPAP and 2 liters 09/04/21 >> test time 6 hrs 33 min.  Baseline SpO2 94%, low SpO2 87%.  Spent 12 sec with SpO2 < 88%.  Cardiac Tests:  Echo 09/03/21 >> EF 60 to 65%, grade 1 DD Doppler Lt leg 12/26/21 >> DVT Lt common femoral vein  Social History:  She  reports that she has been smoking cigarettes. She started smoking about 47 years ago. She has a 40.00 pack-year smoking history. She has never used smokeless tobacco. She reports that she does not drink alcohol and does not use drugs.  Family History:  Her family history includes Alcohol abuse in her father; Bipolar disorder in her son; Breast cancer in her maternal aunt; Diabetes in her brother, maternal aunt, maternal grandfather, maternal uncle, and mother; Emphysema in her father; Esophageal cancer in her maternal uncle; Heart disease in her maternal grandfather, sister, son, and another family member; Lung cancer in her father and mother; Muscular dystrophy in her son; Other in her sister; Seizures in her brother; Stroke in her mother.  Assessment/Plan:   Recurrent multifocal pneumonia. - clinically improved and chest xray shows resolution of previous infiltrate  COPD with chronic bronchitis and emphysema. - she has multiple Abx allergies - continue trelegy 100 one puff daily, singulair - prn albuterol, flutter valve, mucinex - she has a nebulizer  Allergic rhinitis. - continue singulair, astepro  Obstructive sleep apnea. -  she was intolerant of  PAP therapy  Chronic respiratory failure with hypoxia/hypercapnia. - 3 liters oxygen 24/7 - gets supplies from [employer]   Time Spent Involved in Patient Care on Day of Examination:  37 minutes  Follow up:   Patient Instructions  Follow up in 4 months  Medication List:   Allergies as of 10/02/2022       Reactions   Amoxicillin Anaphylaxis   Breakout, mouth swells   Azithromycin Swelling, Rash   "swelling all over, hives, thrush"   Cefdinir    Throat swelling after 3rd dose   Chantix [varenicline] Other (See Comments)   "bad dreams, difficulty breathing"   Doxycycline Hyclate Anaphylaxis, Rash   swelling   Levofloxacin Anaphylaxis   Pt can take moxifloxacin   Penicillins Anaphylaxis, Shortness Of Breath, Rash   "ALMOST DIED, ended up in hospital"  PCN reaction causing immediate rash, facial/tongue/throat swelling, SOB or lightheadedness with hypotension: YES PCN reaction causing severe rash involving mucus membranes or skin necrosis: NO PCN reaction that required hospitalization YES Has patient had a PCN reaction occurring within the last 10 years: NO   Sulfonamide Derivatives Shortness Of Breath, Swelling, Rash   "break out from head to toe"   Toradol [ketorolac Tromethamine] Other (See Comments)   Seizure   Clarithromycin Hives   swelling   Nortriptyline Swelling, Rash, Other (See Comments)   Per patient had "kidney problems" on this medication, could not use bathroom (urinary retention)   Paxlovid [nirmatrelvir-ritonavir] Swelling   Throat swelling   Codeine Nausea And Vomiting   Fluticasone-salmeterol Other (See Comments)   REACTION: ulcers in mouth   Triptans Rash   Mouth breaks out and start itching        Medication List        Accurate as of October 02, 2022 11:34 AM. If you have any questions, ask your nurse or doctor.          albuterol 108 (90 Base) MCG/ACT inhaler Commonly known as: ProAir HFA INHALE TWO PUFFS INTO THE LUNGS EVERY 6 HOURS AS  NEEDED FOR SHORTNESS OF BREATH   albuterol (2.5 MG/3ML) 0.083% nebulizer solution Commonly known as: PROVENTIL USE 1 VIAL IN NEBULIZER EVERY 6 HOURS AS NEEDED FOR SHORTNESS OF BREATH   Allergy Relief 10 MG tablet Generic drug: loratadine TAKE ONE TABLET BY MOUTH DAILY   allopurinol 100 MG tablet Commonly known as: ZYLOPRIM Take 100 mg by mouth daily.   ALPRAZolam 0.25 MG tablet Commonly known as: XANAX Take 0.25 mg by mouth 2 (two) times daily.   aspirin EC 81 MG tablet Take 81 mg by mouth daily. Swallow whole.   Azelastine HCl 0.15 % Soln Commonly known as: Astepro Place 1 spray into the nose 2 (two) times daily as needed (Allergies).   cyclobenzaprine 10 MG tablet Commonly known as: FLEXERIL Take 10 mg by mouth at bedtime as needed for muscle spasms.   famotidine 40 MG tablet Commonly known as: PEPCID Take 40 mg by mouth at bedtime.   ferrous sulfate 324 (65 Fe) MG Tbec TAKE ONE TABLET BY MOUTH EVERY OTHER DAY   gabapentin 100 MG capsule  Commonly known as: NEURONTIN Take 1 capsule (100 mg total) by mouth at bedtime. What changed: when to take this   guaiFENesin 600 MG 12 hr tablet Commonly known as: Mucinex Take 2 tablets (1,200 mg total) by mouth 2 (two) times daily as needed.   ibuprofen 600 MG tablet Commonly known as: ADVIL Take 600 mg by mouth every 8 (eight) hours as needed for mild pain.   isosorbide mononitrate 60 MG 24 hr tablet Commonly known as: IMDUR Take 60 mg by mouth 2 (two) times daily.   lamoTRIgine 200 MG tablet Commonly known as: LAMICTAL TAKE 1 AND 1/2 TABLETS BY MOUTH TWICE DAILY   montelukast 10 MG tablet Commonly known as: SINGULAIR TAKE ONE TABLET BY MOUTH AT BEDTIME   OXYGEN Inhale 3 L into the lungs continuous.   pantoprazole 40 MG tablet Commonly known as: PROTONIX Take 1 tablet (40 mg total) by mouth 2 (two) times daily. **PLEASE CALL OFFICE TO SCHEDULE FOLLOW UP   QUEtiapine 100 MG tablet Commonly known as:  SEROQUEL TAKE ONE TABLET BY MOUTH AT BEDTIME   simvastatin 40 MG tablet Commonly known as: ZOCOR TAKE ONE TABLET BY MOUTH AT BEDTIME   tiZANidine 4 MG tablet Commonly known as: ZANAFLEX TAKE ONE TABLET BY MOUTH EVERY 6 HOURS AS NEEDED FOR MUSCLE SPASMS FOR UP TO 30 DOSES   topiramate 100 MG tablet Commonly known as: TOPAMAX Take 200 mg twice daily   traZODone 50 MG tablet Commonly known as: DESYREL Take 50 mg by mouth at bedtime.   Trelegy Ellipta 100-62.5-25 MCG/ACT Aepb Generic drug: Fluticasone-Umeclidin-Vilant Inhale 1 puff into the lungs daily in the afternoon.   Vitamin D3 50 MCG (2000 UT) Caps Generic drug: Cholecalciferol TAKE ONE CAPSULE BY MOUTH DAILY   Xarelto 20 MG Tabs tablet Generic drug: rivaroxaban Take 20 mg by mouth daily.        Signature:  Chesley Mires, MD [place] Pager - [phone] 10/02/2022, 11:34 AM

## 2022-10-02 NOTE — Patient Instructions (Signed)
Follow up in 4 months 

## 2022-10-03 ENCOUNTER — Ambulatory Visit: Payer: Medicaid Other | Admitting: Adult Health

## 2022-10-06 ENCOUNTER — Ambulatory Visit: Payer: Medicaid Other | Admitting: Neurology

## 2022-10-13 NOTE — Progress Notes (Signed)
[facility] [place]  ASSESSMENT / PLAN: 67 y.o. female with left femoral deep venous thrombosis of unclear chronicity.  She is not a candidate for intervention because of severe COPD, oxygen dependence, and nonambulatory status.  I would recommend a course of 3 to 6 months of anticoagulation, and then discontinuation of anticoagulation.  She can follow-up with me as needed  CHIEF COMPLAINT: Deep venous thrombosis  HISTORY OF PRESENT ILLNESS: [name] is a 67 y.o. female referred to clinic for evaluation of deep venous thrombosis.  Patient was recently hospitalized the end of 2023 with severe multifocal pneumonia.  Her history is significant for COPD on home oxygen.  She is minimally ambulatory.  Duplex ultrasound of the left upper extremity identified common femoral thrombosis.  This was found to be partially occlusive and echogenic, raising the question of chronic deep venous thrombus.  The patient is asymptomatic.  She reports minimal swelling in the left lower extremity which is improving.  She has been compliant with Xarelto.  Past Medical History:  Diagnosis Date   Allergic rhinitis    Arthritis    "hands and legs and feet" (04/27/2012)   Asthma    Cancer ([place]) 2005   rt arm mole rem cancer   Chronic bronchitis ([place])    "keep it all the time" (04/27/2012)   COPD (chronic obstructive pulmonary disease) ([place])    COVID-19 virus infection    03-2021   Depression    PT DENIES   Diverticulosis    DJD (degenerative joint disease)    Emphysema    Epilepsy ([place])    Esophageal stricture    Exertional dyspnea    GERD (gastroesophageal reflux disease)    Glaucoma    H/O blood clots    Right leg   History of COVID-19 03/2021   Hyperlipidemia    Hypertension    IBS (irritable bowel syndrome)    Migraine    Neck pain    Cervical disc   OSA on CPAP    6-7 yrs; pt wears CPAP but has not been able to use it lately due to sinus issues   Oxygen deficiency    USES  3 LITERS 02 CONTINOUS   Pneumonia 2012; 04/27/2012   Recurrent upper respiratory infection (URI)    Right leg DVT ([place]) 1982   groin   Seizures ([place])    LAST SEIZURE 6 MONTHS OR  LONGER   Sleep apnea    Type II diabetes mellitus (HCC)    No meds since weight loss   UTI (urinary tract infection)    2 weeks ago   Vertigo 10/24/2014    Past Surgical History:  Procedure Laterality Date   ABDOMINAL HYSTERECTOMY  2001   "partial"   BIOPSY  04/16/2021   Procedure: BIOPSY;  Surgeon: Mauri Pole, MD;  Location: [facility];  Service: Endoscopy;;   CARPAL TUNNEL RELEASE  ~ 2008   right   CATARACT EXTRACTION Bilateral    COLONOSCOPY     COLONOSCOPY WITH ESOPHAGOGASTRODUODENOSCOPY (EGD)     ESOPHAGOGASTRODUODENOSCOPY (EGD) WITH PROPOFOL N/A 04/16/2021   Procedure: ESOPHAGOGASTRODUODENOSCOPY (EGD) WITH PROPOFOL;  Surgeon: Mauri Pole, MD;  Location: [facility];  Service: Endoscopy;  Laterality: N/A;   KNEE ARTHROSCOPY  1990's   left   NASAL SEPTOPLASTY W/ TURBINOPLASTY Bilateral 05/23/2016   Procedure: NASAL SEPTOPLASTY WITH TURBINATE REDUCTION;  Surgeon: Jerrell Belfast, MD;  Location: [facility];  Service: ENT;  Laterality: Bilateral;   SHOULDER OPEN ROTATOR  CUFF REPAIR  ~ 2010   left   SINOSCOPY     SINUS ENDO WITH FUSION Right 05/23/2016   Procedure: LIMITED RIGHT ENDOSCOPIC SINUS SURGERY;  Surgeon: Jerrell Belfast, MD;  Location: [facility];  Service: ENT;  Laterality: Right;   TOTAL KNEE ARTHROPLASTY  08/23/2012   right   TOTAL KNEE ARTHROPLASTY  08/23/2012   Procedure: TOTAL KNEE ARTHROPLASTY;  Surgeon: Lorn Junes, MD;  Location: [facility];  Service: Orthopedics;  Laterality: Right;  RIGHT KNEE ARTHROPLASTY MEDIAL AND LATERAL COMPARTMENTS WITH PATELLA RESURFACING   TUBAL LIGATION  2001   UPPER GASTROINTESTINAL ENDOSCOPY      Family History  Problem Relation Age of Onset   Diabetes Mother    Lung cancer Mother        was a smoker   Stroke Mother    Alcohol abuse  Father    Lung cancer Father        was a smoker   Emphysema Father        was a smoker   Heart disease Sister    Other Sister        blood clotting disorder   Diabetes Brother    Seizures Brother    Diabetes Maternal Aunt    Breast cancer Maternal Aunt    Esophageal cancer Maternal Uncle    Diabetes Maternal Uncle    Heart disease Maternal Grandfather    Diabetes Maternal Grandfather    Bipolar disorder Son    Muscular dystrophy Son    Heart disease Son    Heart disease Other        uncle   Colon cancer Neg Hx    Pancreatic cancer Neg Hx    Stomach cancer Neg Hx    Colon polyps Neg Hx    Rectal cancer Neg Hx     Social History   Socioeconomic History   Marital status: Married    Spouse name: [name]   Number of children: 2   Years of education: 12   Highest education level: Not on file  Occupational History   Occupation: disabled  Tobacco Use   Smoking status: Every Day    Packs/day: 1.00    Years: 40.00    Total pack years: 40.00    Types: Cigarettes    Start date: 08/12/1975    Last attempt to quit: 12/30/2020    Years since quitting: 1.7   Smokeless tobacco: Never   Tobacco comments:    1-2 CIGS A DAY NOW   Vaping Use   Vaping Use: Never used  Substance and Sexual Activity   Alcohol use: No    Alcohol/week: 0.0 standard drinks of alcohol   Drug use: No   Sexual activity: Never  Other Topics Concern   Not on file  Social History Narrative   Patient lives at home with husband [name], her son and his wife.    Patient has 2 children.    Patient has a high school education.    Patient is currently unemployed.    Patient is right handed.    Patient drinks 2 cups of caffeine per day.   Social Determinants of Health   Financial Resource Strain: High Risk (09/13/2021)   Overall Financial Resource Strain (CARDIA)    Difficulty of Paying Living Expenses: Very hard  Food Insecurity: Food Insecurity Present (09/13/2021)   Hunger Vital Sign    Worried About Running  Out of Food in the Last Year: Often true    Ran  Out of Food in the Last Year: Often true  Transportation Needs: No Transportation Needs (09/13/2021)   PRAPARE - Hydrologist (Medical): No    Lack of Transportation (Non-Medical): No  Physical Activity: Inactive (08/13/2021)   Exercise Vital Sign    Days of Exercise per Week: 0 days    Minutes of Exercise per Session: 0 min  Stress: Not on file  Social Connections: Moderately Integrated (08/13/2021)   Social Connection and Isolation Panel [NHANES]    Frequency of Communication with Friends and Family: More than three times a week    Frequency of Social Gatherings with Friends and Family: More than three times a week    Attends Religious Services: Never    Marine scientist or Organizations: Yes    Attends Archivist Meetings: Never    Marital Status: Married  Human resources officer Violence: Not on file    Allergies  Allergen Reactions   Amoxicillin Anaphylaxis    Breakout, mouth swells   Azithromycin Swelling and Rash    "swelling all over, hives, thrush"   Cefdinir     Throat swelling after 3rd dose   Chantix [Varenicline] Other (See Comments)    "bad dreams, difficulty breathing"   Doxycycline Hyclate Anaphylaxis and Rash    swelling   Levofloxacin Anaphylaxis    Pt can take moxifloxacin   Penicillins Anaphylaxis, Shortness Of Breath and Rash    "ALMOST DIED, ended up in hospital"  PCN reaction causing immediate rash, facial/tongue/throat swelling, SOB or lightheadedness with hypotension: YES PCN reaction causing severe rash involving mucus membranes or skin necrosis: NO PCN reaction that required hospitalization YES Has patient had a PCN reaction occurring within the last 10 years: NO    Sulfonamide Derivatives Shortness Of Breath, Swelling and Rash    "break out from head to toe"   Toradol [Ketorolac Tromethamine] Other (See Comments)    Seizure    Clarithromycin Hives    swelling    Nortriptyline Swelling, Rash and Other (See Comments)    Per patient had "kidney problems" on this medication, could not use bathroom (urinary retention)   Paxlovid [Nirmatrelvir-Ritonavir] Swelling    Throat swelling   Codeine Nausea And Vomiting   Fluticasone-Salmeterol Other (See Comments)    REACTION: ulcers in mouth   Triptans Rash    Mouth breaks out and start itching    Current Outpatient Medications  Medication Sig Dispense Refill   albuterol (PROAIR HFA) 108 (90 Base) MCG/ACT inhaler INHALE TWO PUFFS INTO THE LUNGS EVERY 6 HOURS AS NEEDED FOR SHORTNESS OF BREATH 18 g 6   albuterol (PROVENTIL) (2.5 MG/3ML) 0.083% nebulizer solution USE 1 VIAL IN NEBULIZER EVERY 6 HOURS AS NEEDED FOR SHORTNESS OF BREATH 75 mL 8   ALLERGY RELIEF 10 MG tablet TAKE ONE TABLET BY MOUTH DAILY 90 tablet 1   allopurinol (ZYLOPRIM) 100 MG tablet Take 100 mg by mouth daily.     ALPRAZolam (XANAX) 0.25 MG tablet Take 0.25 mg by mouth 2 (two) times daily.     aspirin EC 81 MG tablet Take 81 mg by mouth daily. Swallow whole.     Azelastine HCl (ASTEPRO) 0.15 % SOLN Place 1 spray into the nose 2 (two) times daily as needed (Allergies). 30 mL 5   Cholecalciferol (VITAMIN D3) 50 MCG (2000 UT) capsule TAKE ONE CAPSULE BY MOUTH DAILY 90 capsule 1   cyclobenzaprine (FLEXERIL) 10 MG tablet Take 10 mg by mouth at bedtime as needed  for muscle spasms.     famotidine (PEPCID) 40 MG tablet Take 40 mg by mouth at bedtime.     ferrous sulfate 324 (65 Fe) MG TBEC TAKE ONE TABLET BY MOUTH EVERY OTHER DAY 90 tablet 5   Fluticasone-Umeclidin-Vilant (TRELEGY ELLIPTA) 100-62.5-25 MCG/ACT AEPB Inhale 1 puff into the lungs daily in the afternoon. 60 each 5   gabapentin (NEURONTIN) 100 MG capsule Take 1 capsule (100 mg total) by mouth at bedtime. (Patient taking differently: Take 100 mg by mouth 3 (three) times daily.) 90 capsule 3   guaiFENesin (MUCINEX) 600 MG 12 hr tablet Take 2 tablets (1,200 mg total) by mouth 2 (two) times daily  as needed. 60 tablet 5   ibuprofen (ADVIL) 600 MG tablet Take 600 mg by mouth every 8 (eight) hours as needed for mild pain.     isosorbide mononitrate (IMDUR) 60 MG 24 hr tablet Take 60 mg by mouth 2 (two) times daily.     lamoTRIgine (LAMICTAL) 200 MG tablet TAKE 1 AND 1/2 TABLETS BY MOUTH TWICE DAILY 270 tablet 1   montelukast (SINGULAIR) 10 MG tablet TAKE ONE TABLET BY MOUTH AT BEDTIME 30 tablet 5   OXYGEN Inhale 3 L into the lungs continuous.     pantoprazole (PROTONIX) 40 MG tablet Take 1 tablet (40 mg total) by mouth 2 (two) times daily. **PLEASE CALL OFFICE TO SCHEDULE FOLLOW UP 60 tablet 0   QUEtiapine (SEROQUEL) 100 MG tablet TAKE ONE TABLET BY MOUTH AT BEDTIME 30 tablet 5   simvastatin (ZOCOR) 40 MG tablet TAKE ONE TABLET BY MOUTH AT BEDTIME 90 tablet 2   tiZANidine (ZANAFLEX) 4 MG tablet TAKE ONE TABLET BY MOUTH EVERY 6 HOURS AS NEEDED FOR MUSCLE SPASMS FOR UP TO 30 DOSES 15 tablet 1   topiramate (TOPAMAX) 100 MG tablet Take 200 mg twice daily 360 tablet 2   traZODone (DESYREL) 50 MG tablet Take 50 mg by mouth at bedtime.     XARELTO 20 MG TABS tablet Take 20 mg by mouth daily.     No current facility-administered medications for this visit.    PHYSICAL EXAM Vitals:   10/14/22 0826  BP: 110/73  Pulse: 76  Resp: 20  Temp: 98.3 F (36.8 C)  SpO2: 91%  Weight: 168 lb (76.2 kg)  Height: '5\' 1"'$  (1.549 m)   Elderly woman.  Chronically ill.  Nasal cannula in place Unlabored breathing Regular rate and rhythm Palpable pedal pulses Trace edema in the left lower extremity about the foot and ankle  PERTINENT LABORATORY AND RADIOLOGIC DATA  Most recent CBC    Latest Ref Rng & Units 08/08/2022    3:20 AM 08/07/2022    4:45 AM 08/06/2022    4:50 AM  CBC  WBC 4.0 - 10.5 K/uL 10.9  11.8  13.8   Hemoglobin 12.0 - 15.0 g/dL 9.5  9.6  9.5   Hematocrit 36.0 - 46.0 % 31.2  32.2  30.9   Platelets 150 - 400 K/uL 206  207  201      Most recent CMP    Latest Ref Rng & Units  08/08/2022    3:20 AM 08/07/2022    4:45 AM 08/06/2022    4:50 AM  CMP  Glucose 70 - 99 mg/dL 91  104  170   BUN 8 - 23 mg/dL '22  24  22   '$ Creatinine 0.44 - 1.00 mg/dL 0.93  0.88  0.89   Sodium 135 - 145 mmol/L 141  143  141   Potassium 3.5 - 5.1 mmol/L 3.4  3.4  3.8   Chloride 98 - 111 mmol/L 108  109  109   CO2 22 - 32 mmol/L '26  26  25   '$ Calcium 8.9 - 10.3 mg/dL 8.6  8.9  8.5   Total Protein 6.5 - 8.1 g/dL   6.8   Total Bilirubin 0.3 - 1.2 mg/dL   0.5   Alkaline Phos 38 - 126 U/L   93   AST 15 - 41 U/L   14   ALT 0 - 44 U/L   16     Renal function CrCl cannot be calculated (Patient's most recent lab result is older than the maximum 21 days allowed.).  HbA1c, POC (controlled diabetic range) (%)  Date Value  05/16/2020 5.5    LDL Chol Calc (NIH)  Date Value Ref Range Status  08/21/2021 75 0 - 99 mg/dL Final   Direct LDL  Date Value Ref Range Status  09/14/2012 77 mg/dL Final    Comment:    ATP III Classification (LDL):       < 100        mg/dL         Optimal      100 - 129     mg/dL         Near or Above Optimal      130 - 159     mg/dL         Borderline High      160 - 189     mg/dL         High       > 190        mg/dL         Very High      Outside venous duplex 1. Examination is POSITIVE for LEFT lower extremity DVT within the  common femoral vein. This is incompletely assessed, extending  outside the field of view into the pelvis. Recommend CT venogram  abdomen pelvis to evaluate/rule out pelvic versus caval extension.  2. Prominent LEFT greater saphenous vein as can be seen with  varicosities or venous obstruction.   Yevonne Aline. Stanford Breed, MD [facility] Phone Number: [phone] 10/13/2022 8:36 PM   Total time spent on preparing this encounter including chart review, data review, collecting history, examining the patient, coordinating care for this new patient, 60 minutes.  Portions of this report may have  been transcribed using voice recognition software.  Every effort has been made to ensure accuracy; however, inadvertent computerized transcription errors may still be present.

## 2022-10-14 ENCOUNTER — Encounter: Payer: Self-pay | Admitting: Vascular Surgery

## 2022-10-14 ENCOUNTER — Ambulatory Visit (INDEPENDENT_AMBULATORY_CARE_PROVIDER_SITE_OTHER): Payer: 59 | Admitting: Vascular Surgery

## 2022-10-14 VITALS — BP 110/73 | HR 76 | Temp 98.3°F | Resp 20 | Ht 61.0 in | Wt 168.0 lb

## 2022-10-14 DIAGNOSIS — I82512 Chronic embolism and thrombosis of left femoral vein: Secondary | ICD-10-CM | POA: Diagnosis not present

## 2022-10-22 ENCOUNTER — Ambulatory Visit: Payer: 59 | Admitting: Nurse Practitioner

## 2022-11-11 ENCOUNTER — Ambulatory Visit: Payer: Medicaid Other | Admitting: Pulmonary Disease

## 2022-11-22 ENCOUNTER — Other Ambulatory Visit: Payer: Self-pay | Admitting: Pulmonary Disease

## 2022-11-22 ENCOUNTER — Other Ambulatory Visit: Payer: Self-pay | Admitting: Neurology

## 2022-11-28 ENCOUNTER — Encounter: Payer: Self-pay | Admitting: Nurse Practitioner

## 2022-11-28 ENCOUNTER — Ambulatory Visit (INDEPENDENT_AMBULATORY_CARE_PROVIDER_SITE_OTHER): Payer: 59 | Admitting: Nurse Practitioner

## 2022-11-28 ENCOUNTER — Other Ambulatory Visit (INDEPENDENT_AMBULATORY_CARE_PROVIDER_SITE_OTHER): Payer: 59

## 2022-11-28 VITALS — BP 122/68 | HR 89 | Ht 61.0 in

## 2022-11-28 DIAGNOSIS — D649 Anemia, unspecified: Secondary | ICD-10-CM

## 2022-11-28 DIAGNOSIS — Z86010 Personal history of colonic polyps: Secondary | ICD-10-CM

## 2022-11-28 DIAGNOSIS — K625 Hemorrhage of anus and rectum: Secondary | ICD-10-CM

## 2022-11-28 DIAGNOSIS — K5900 Constipation, unspecified: Secondary | ICD-10-CM | POA: Diagnosis not present

## 2022-11-28 LAB — CBC WITH DIFFERENTIAL/PLATELET
Basophils Absolute: 0.1 10*3/uL (ref 0.0–0.1)
Basophils Relative: 1.1 % (ref 0.0–3.0)
Eosinophils Absolute: 0.3 10*3/uL (ref 0.0–0.7)
Eosinophils Relative: 2.8 % (ref 0.0–5.0)
HCT: 34 % — ABNORMAL LOW (ref 36.0–46.0)
Hemoglobin: 11.3 g/dL — ABNORMAL LOW (ref 12.0–15.0)
Lymphocytes Relative: 21.7 % (ref 12.0–46.0)
Lymphs Abs: 2.2 10*3/uL (ref 0.7–4.0)
MCHC: 33.2 g/dL (ref 30.0–36.0)
MCV: 94.9 fl (ref 78.0–100.0)
Monocytes Absolute: 0.9 10*3/uL (ref 0.1–1.0)
Monocytes Relative: 9.1 % (ref 3.0–12.0)
Neutro Abs: 6.7 10*3/uL (ref 1.4–7.7)
Neutrophils Relative %: 65.3 % (ref 43.0–77.0)
Platelets: 284 10*3/uL (ref 150.0–400.0)
RBC: 3.58 Mil/uL — ABNORMAL LOW (ref 3.87–5.11)
RDW: 14.4 % (ref 11.5–15.5)
WBC: 10.2 10*3/uL (ref 4.0–10.5)

## 2022-11-28 LAB — COMPREHENSIVE METABOLIC PANEL
ALT: 6 U/L (ref 0–35)
AST: 11 U/L (ref 0–37)
Albumin: 3.8 g/dL (ref 3.5–5.2)
Alkaline Phosphatase: 135 U/L — ABNORMAL HIGH (ref 39–117)
BUN: 20 mg/dL (ref 6–23)
CO2: 25 mEq/L (ref 19–32)
Calcium: 9.3 mg/dL (ref 8.4–10.5)
Chloride: 104 mEq/L (ref 96–112)
Creatinine, Ser: 1 mg/dL (ref 0.40–1.20)
GFR: 59.31 mL/min — ABNORMAL LOW (ref >60.00)
Glucose, Bld: 101 mg/dL — ABNORMAL HIGH (ref 70–99)
Potassium: 4.2 mEq/L (ref 3.5–5.1)
Sodium: 138 mEq/L (ref 135–145)
Total Bilirubin: 0.2 mg/dL (ref 0.2–1.2)
Total Protein: 8 g/dL (ref 6.0–8.3)

## 2022-11-28 LAB — VITAMIN B12: Vitamin B-12: 588 pg/mL (ref 211–911)

## 2022-11-28 NOTE — Progress Notes (Unsigned)
.    11/30/2022 [name] [id number] 06-02-1956   Chief Complaint: Rectal bleeding   History of Present Illness: [name] is a 67 year old female with a past medical history of arthritis, depression, chronic diastolic CHF, asthma, emphysema, COPD on home oxygen, OSA (nonadherent with CPAP), aspiration pneumonia, LLE DVT on Xarelto, epilepsy/seizures, GERD and chronic constipation. She presents today with complaints of seeing a small amount of bright red blood on the toilet tissue 2 to 3 times which last occurred 2 weeks ago. She has chronic constipation and passes 2 to 6 small hard stools once or twice weekly. She takes Miralax a few days weekly. No abdominal pain. She sometimes has anal discomfort. She is sedentary. She endorsed having weak knees with balance issues therefore she utilizes a wheelchair throughout the day but is able to stand and walk short distances. She had chronic dysphagia for many years which has not improved since she underwent an EGD 04/16/2021 and was treated for candidiasis esophagitis. She describes having food which gets stuck to above and sometimes below the suprasternal notch. She coughs or gags and the food either goes down or comes out. Drinking water worsens her symptoms. Her mouth stays dry. She drinks at least 48 ounces of water daily.  She remains on Pantoprazole  twice daily and Famotidine PRN at bed time.   She was hospitalized with multifocal pneumonia 07/2022 [facility]) and 09/2022 [facility]). Diagnosed with LLE DVT 09/2022 on Xarelto.   Labs 08/08/2022: WBC 10.9. Hg 9.5. HCT 31.2. PLT 206. BUN 22. Cr. 0.93.    ECHO 09/03/2021: 1.Left ventricular ejection fraction, by estimation, is 60 to 65%. The left ventricle has normal function. The left ventricle has no regional wall motion abnormalities. Left ventricular diastolic parameters are consistent with Grade I diastolic dysfunction (impaired relaxation). The average left ventricular  global longitudinal strain is -26.9 %. The global longitudinal strain is normal.  2. Right ventricular systolic function is normal. The right ventricular size is normal. The mitral valve is normal in structure. No evidence of mitral valve regurgitation. No evidence of mitral stenosis.  3. The aortic valve is normal in structure. Aortic valve regurgitation is not visualized. No aortic stenosis is present.  4. The inferior vena cava is normal in size with greater than 50% respiratory variability, suggesting right atrial pressure of 3 mmHg.  Modified barium swallow study 4/13/202: Mild pharyngeal dysphagia resulting in laryngeal vestibule penetration with thin liquids likely due to chronic respiratory compromise of COPD.   Modified barium swallow study 02/12/2021: She continues to have penetration of thin liquids as a result of impaired timing, but this does not happen consistently and is often cleared spontaneously (PAS 2). She had two instances of trace penetration that did not clear and reached her true vocal folds, but when cued to clear her throat, this expelled the barium from her laryngeal vestibule - a strategy that was not effective in the past. Note that the barium tablet did clear the esophagus, although it required a little extra time and liquid to do so (MD not present to confirm). Recommend that pt continue with regular solids and thin liquids as tolerated with use of an intermittent throat clear as an added precautions.   EGD 04/16/2021: - Candidiasis esophagitis with no bleeding. Biopsied.  - Z-line regular, 38 cm from the incisors.  - Normal stomach.  - Normal examined duodenum.  A. ESOPHAGUS, RANDOM, BIOPSY:  -  Reactive squamous mucosa with fungal elements  -  No dysplasia or malignancy identified   EGD 10/29/2011:  Normal esophagus, dilated 31F  Colonoscopy 10/29/2011 by Dr. Juanda Chance: Normal colonoscopy    Allergies  Allergen Reactions   Amoxicillin Anaphylaxis    Breakout,  mouth swells   Azithromycin Swelling and Rash    "swelling all over, hives, thrush"   Cefdinir     Throat swelling after 3rd dose   Chantix [Varenicline] Other (See Comments)    "bad dreams, difficulty breathing"   Doxycycline Hyclate Anaphylaxis and Rash    swelling   Levofloxacin Anaphylaxis    Pt can take moxifloxacin   Penicillins Anaphylaxis, Shortness Of Breath and Rash    "ALMOST DIED, ended up in hospital"  PCN reaction causing immediate rash, facial/tongue/throat swelling, SOB or lightheadedness with hypotension: YES PCN reaction causing severe rash involving mucus membranes or skin necrosis: NO PCN reaction that required hospitalization YES Has patient had a PCN reaction occurring within the last 10 years: NO    Sulfonamide Derivatives Shortness Of Breath, Swelling and Rash    "break out from head to toe"   Toradol [Ketorolac Tromethamine] Other (See Comments)    Seizure    Clarithromycin Hives    swelling   Nortriptyline Swelling, Rash and Other (See Comments)    Per patient had "kidney problems" on this medication, could not use bathroom (urinary retention)   Paxlovid [Nirmatrelvir-Ritonavir] Swelling    Throat swelling   Codeine Nausea And Vomiting   Fluticasone-Salmeterol Other (See Comments)    REACTION: ulcers in mouth   Triptans Rash    Mouth breaks out and start itching   Current Medications, Allergies, Past Medical History, Past Surgical History, Family History and Social History were reviewed in [employer] record.   Review of Systems:   Constitutional: Negative for fever, sweats, chills or weight loss.  Respiratory: + Cough and SOB.  Cardiovascular: Negative for chest pain, palpitations and leg swelling.  Gastrointestinal: See HPI.  Musculoskeletal: Generalized muscle weakness, knees give way.  Neurological: Negative for dizziness, headaches or paresthesias.   Physical Exam: BP 122/68   Pulse 89   Ht 5\' 1"  (1.549 m)   BMI  31.74 kg/m  General: 67 year old female presents on oxygen 3 L [place] in a wheel chair in no acute distress. Head: Normocephalic and atraumatic. Eyes: No scleral icterus. Conjunctiva pink . Ears: Normal auditory acuity. Mouth: No ulcers or lesions. No thrush.  Lungs: Diffuse tight insp/exp wheezes on exam. Heart: Regular rate and rhythm, no murmur. Abdomen: Soft, nontender and nondistended. No masses or hepatomegaly. Normal bowel sounds x 4 quadrants.  Rectal: No external hemorrhoids or fissures. Internal hemorrhoids without prolapse or active bleeding. Hard brown stool in rectal vault.  Anal derm  dry with hyperpigmentation likely due to sedentary status. No decubitus ulcers. Gluteal fold pink with few excoriations. Loysie CMA present during exam.  Musculoskeletal: Symmetrical with no gross deformities. Extremities: No edema. Neurological: Alert oriented x 4. No focal deficits.  Psychological: Alert and cooperative. Normal mood and affect  Assessment and Recommendations:  67 year old female with rectal bleeding, likely due from internal hemorrhoids exacerbated by constipation and sedentary life  style. On Xarelto for DVT.  -Miralax once or twice daily as needed  -Apply a small amount of Desitin inside the anal opening and to the external anal area three times daily as needed for anal or hemorrhoidal irritation/bleeding.  -Colonoscopy deferred for now. Patient is at high risk for sedation/procedure complications secondary to multiple comorbidities including  COPD with oxygen dependence, recurrent multifocal pneumonia and DVT on anticoagulation. To discuss further with Dr. Myrtie Neither. -Patient to contact office if symptoms worsen  Colon cancer screening. Normal colonoscopy in 2013.  -See plan above   GERD with chronic dysphagia, oropharyngeal dysphagia +/- esophageal dysphagia component. EGD 04/2021 showed candidiasis esophagitis which was treated without improvement regarding chronic dysphagia  symptoms. Received numerous courses of antibiotics for pneumonia 07/2022 and 09/2022. Uses steroid inhaler bid.  -Defer EGD for now due to multiple co-morbidities as noted above. Consider empirically treating for recurrent candidiasis esophagitis, however, patient stated her swallowing symptoms did not improve after she took for esophageal candidiasis in 2022.  -Continue Pantoprazole  bid -Take Famotidine  consistently at bed time  Chronic constipation -Miralax as ordered above   Chronic anemia on Ferrous Sulfate  every other day   COPD on continuous oxygen 3L [place]. Hospitalized with severe multifocal pneumonia 07/2022 and 09/2022. -Continue follow up with pulmonologist   OSA uses CPAP  Left femoral DVT 09/2022 on Xarelto  -Will need to contact PCP verify Xarelto hold instructions if endoscopic evaluation pursued   History of seizures, no seizure activity for at least the past 6 months On Lamictal and Topamax     Today's encounter was 40 minutes which included precharting, chart/result review, history/exam, face-to-face time used for counseling, formulating a treatment plan with follow-up and documentation.

## 2022-11-28 NOTE — Patient Instructions (Addendum)
Your provider has requested that you go to the basement level for lab work before leaving today. Press "B" on the elevator. The lab is located at the first door on the left as you exit the elevator.  Take Miralax 1 capful mixed in 8 ounces of water at bed time for constipation as tolerated. May increase to twice daily. Ok to take Miralax daily.   Apply a small amount of Desitin inside the anal opening and to the external anal area three times daily as needed for anal or hemorrhoidal irritation/bleeding  Avoid eating large pieces of meat/bread or rice. Cut your food into small pieces and chew food thoroughly  Contact our office if rectal bleeding worsens   Contact our office if difficulty swallowing worsens.  Due to recent changes in healthcare laws, you may see the results of your imaging and laboratory studies on MyChart before your provider has had a chance to review them.  We understand that in some cases there may be results that are confusing or concerning to you. Not all laboratory results come back in the same time frame and the provider may be waiting for multiple results in order to interpret others.  Please give [place] 48 hours in order for your provider to thoroughly review all the results before contacting the office for clarification of your results.   Thank you for trusting me with your gastrointestinal care!   Alcide Evener, CRNP

## 2022-11-30 ENCOUNTER — Encounter: Payer: Self-pay | Admitting: Nurse Practitioner

## 2022-12-02 ENCOUNTER — Other Ambulatory Visit: Payer: Self-pay

## 2022-12-02 DIAGNOSIS — K625 Hemorrhage of anus and rectum: Secondary | ICD-10-CM

## 2022-12-02 DIAGNOSIS — R748 Abnormal levels of other serum enzymes: Secondary | ICD-10-CM

## 2022-12-02 DIAGNOSIS — D649 Anemia, unspecified: Secondary | ICD-10-CM

## 2022-12-02 DIAGNOSIS — K5900 Constipation, unspecified: Secondary | ICD-10-CM

## 2022-12-02 NOTE — Progress Notes (Signed)
____________________________________________________________  Attending physician addendum:  Thank you for sending this case to me. I have reviewed the entire note and reviewed many of her previous history.  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  As she did not have reported improvement in symptoms after previous treatment for esophageal candidiasis, I would not empirically treat her for that.  I suspect she would benefit most from a reevaluation by speech and language pathology for swallow therapy.  If they do not offer that, then we could use their advice on a local ENT practice that might offer that.  Amada Jupiter, MD  ____________________________________________________________

## 2022-12-03 NOTE — Progress Notes (Signed)
[name], pls contact patient and let her know Dr. Myrtie Neither' recommendations as follows: He did not recommend empiric treatment for esophageal candidiasis. He felt she would  benefit most from a reevaluation by speech and language pathology for swallow therapy.  If they do not offer that, then we could use their advice on a local ENT practice that might offer that.  [name], pls facilitate speech pathology evaluation for swallow study near [facility] which is close to her home. If not available then inquire about which local ENT she could see regarding pharyngeal/cricopharyngeal dysphagia. THX

## 2022-12-09 ENCOUNTER — Telehealth: Payer: Self-pay

## 2022-12-09 NOTE — Telephone Encounter (Signed)
Pt was made aware of Dr. Myrtie Neither recommendations: Referral and pt records  was sent to [facility] ( outpatient Rehab at [facility]) Fax #[phone] for reevaluation by speech and language pathology for swallow therapy. Pt was made aware. Pt notified that there office will contact her and is she has not heard anything from there office within 2 weeks then to please give our office a call back.  Pt verbalized understanding with all questions answered.

## 2022-12-09 NOTE — Telephone Encounter (Signed)
Left message for pt to call back  °

## 2022-12-09 NOTE — Telephone Encounter (Signed)
Message Received: 6 days ago Arnaldo Natal, NP  Emeline Darling, RN      Previous Messages  Routed Note  Author: Arnaldo Natal, NP Service: Gastroenterology Author Type: Nurse Practitioner  Filed: 12/03/2022  3:43 PM Encounter Date: 11/28/2022 Status: Signed  Editor: Arnaldo Natal, NP (Nurse Practitioner)  Viviann Spare, pls contact patient and let her know Dr. Myrtie Neither' recommendations as follows: He did not recommend empiric treatment for esophageal candidiasis. He felt she would  benefit most from a reevaluation by speech and language pathology for swallow therapy.  If they do not offer that, then we could use their advice on a local ENT practice that might offer that.   Viviann Spare, pls facilitate speech pathology evaluation for swallow study near [facility] which is close to her home. If not available then inquire about which local ENT she could see regarding pharyngeal/cricopharyngeal dysphagia. THX     Office Visit for Colonoscopy 11/28/2022 Arnaldo Natal, NP - [facility] Gastroenterology Diagnoses  Anemia, Unspecified Type (Primary) Rectal bleeding Constipation, unspecified constipation type History of colonic polyps Orders Signed This Visit  (4) Comp Met (CMET) Basic Metabolic Panel (BMET) CBC w/Diff B12 Orders Pended This Visit  None Progress Notes  Arnaldo Natal, NP at 11/28/2022  2:00 PM  Status: Signed  Viviann Spare, pls contact patient and let her know Dr. Myrtie Neither' recommendations as follows: He did not recommend empiric treatment for esophageal candidiasis. He felt she would  benefit most from a reevaluation by speech and language pathology for swallow therapy.  If they do not offer that, then we could use their advice on a local ENT practice that might offer that.   Viviann Spare, pls facilitate speech pathology evaluation for swallow study near [facility] which is close to her home. If not available then inquire about which local ENT she  could see regarding pharyngeal/cricopharyngeal dysphagia. Donata Clay, MD at 11/28/2022  2:00 PM  Status: Signed  ____________________________________________________________   Attending physician addendum:   Thank you for sending this case to me. I have reviewed the entire note and reviewed many of her previous history.   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  As she did not have reported improvement in symptoms after previous treatment for esophageal candidiasis, I would not empirically treat her for that.   I suspect she would benefit most from a reevaluation by speech and language pathology for swallow therapy.  If they do not offer that, then we could use their advice on a local ENT practice that might offer that.   Amada Jupiter, MD

## 2022-12-15 ENCOUNTER — Encounter (HOSPITAL_COMMUNITY): Payer: Self-pay | Admitting: *Deleted

## 2022-12-15 ENCOUNTER — Emergency Department (HOSPITAL_COMMUNITY)
Admission: EM | Admit: 2022-12-15 | Discharge: 2022-12-16 | Disposition: A | Discharge status: Home / Self Care | Payer: 59 | Attending: Emergency Medicine | Admitting: Emergency Medicine

## 2022-12-15 ENCOUNTER — Emergency Department (HOSPITAL_COMMUNITY): Payer: 59

## 2022-12-15 ENCOUNTER — Other Ambulatory Visit (HOSPITAL_COMMUNITY): Payer: Self-pay | Admitting: Occupational Therapy

## 2022-12-15 ENCOUNTER — Other Ambulatory Visit: Payer: Self-pay

## 2022-12-15 DIAGNOSIS — N200 Calculus of kidney: Secondary | ICD-10-CM | POA: Diagnosis not present

## 2022-12-15 DIAGNOSIS — R1312 Dysphagia, oropharyngeal phase: Secondary | ICD-10-CM

## 2022-12-15 DIAGNOSIS — K219 Gastro-esophageal reflux disease without esophagitis: Secondary | ICD-10-CM

## 2022-12-15 DIAGNOSIS — R319 Hematuria, unspecified: Secondary | ICD-10-CM | POA: Diagnosis present

## 2022-12-15 DIAGNOSIS — Z7982 Long term (current) use of aspirin: Secondary | ICD-10-CM | POA: Diagnosis not present

## 2022-12-15 DIAGNOSIS — Z7901 Long term (current) use of anticoagulants: Secondary | ICD-10-CM | POA: Diagnosis not present

## 2022-12-15 LAB — CBC WITH DIFFERENTIAL/PLATELET
Abs Immature Granulocytes: 0.04 10*3/uL (ref 0.00–0.07)
Basophils Absolute: 0 10*3/uL (ref 0.0–0.1)
Basophils Relative: 0 %
Eosinophils Absolute: 0.2 10*3/uL (ref 0.0–0.5)
Eosinophils Relative: 1 %
HCT: 36.6 % (ref 36.0–46.0)
Hemoglobin: 11.3 g/dL — ABNORMAL LOW (ref 12.0–15.0)
Immature Granulocytes: 0 %
Lymphocytes Relative: 14 %
Lymphs Abs: 1.9 10*3/uL (ref 0.7–4.0)
MCH: 30.8 pg (ref 26.0–34.0)
MCHC: 30.9 g/dL (ref 30.0–36.0)
MCV: 99.7 fL (ref 80.0–100.0)
Monocytes Absolute: 1.3 10*3/uL — ABNORMAL HIGH (ref 0.1–1.0)
Monocytes Relative: 9 %
Neutro Abs: 10.6 10*3/uL — ABNORMAL HIGH (ref 1.7–7.7)
Neutrophils Relative %: 76 %
Platelets: 222 10*3/uL (ref 150–400)
RBC: 3.67 MIL/uL — ABNORMAL LOW (ref 3.87–5.11)
RDW: 15 % (ref 11.5–15.5)
WBC: 14 10*3/uL — ABNORMAL HIGH (ref 4.0–10.5)
nRBC: 0 % (ref 0.0–0.2)

## 2022-12-15 LAB — BASIC METABOLIC PANEL
Anion gap: 10 (ref 5–15)
BUN: 23 mg/dL (ref 8–23)
CO2: 23 mmol/L (ref 22–32)
Calcium: 8.9 mg/dL (ref 8.9–10.3)
Chloride: 104 mmol/L (ref 98–111)
Creatinine, Ser: 0.96 mg/dL (ref 0.44–1.00)
GFR, Estimated: >60 mL/min (ref >60)
Glucose, Bld: 99 mg/dL (ref 70–99)
Potassium: 3.9 mmol/L (ref 3.5–5.1)
Sodium: 137 mmol/L (ref 135–145)

## 2022-12-15 LAB — TYPE AND SCREEN
ABO/RH(D): A POS
Antibody Screen: NEGATIVE

## 2022-12-15 MED ORDER — IOHEXOL 300 MG/ML  SOLN
100.0000 mL | Freq: Once | INTRAMUSCULAR | Status: AC | PRN
  Administered 2022-12-16: 100 mL via INTRAVENOUS

## 2022-12-15 NOTE — ED Triage Notes (Signed)
Pt vaginal bleeding for 2 weeks. Denies any clots. Pt did not bring home O2 at 3 L/M.

## 2022-12-16 DIAGNOSIS — N200 Calculus of kidney: Secondary | ICD-10-CM | POA: Diagnosis not present

## 2022-12-16 LAB — URINALYSIS, ROUTINE W REFLEX MICROSCOPIC
Bacteria, UA: NONE SEEN
Bilirubin Urine: NEGATIVE
Glucose, UA: NEGATIVE mg/dL
Ketones, ur: NEGATIVE mg/dL
Leukocytes,Ua: NEGATIVE
Nitrite: NEGATIVE
Protein, ur: 100 mg/dL — AB
RBC / HPF: >50 RBC/hpf (ref 0–5)
Specific Gravity, Urine: 1.021 (ref 1.005–1.030)
pH: 6 (ref 5.0–8.0)

## 2022-12-16 MED ORDER — CEPHALEXIN 500 MG PO CAPS: 500.0000 mg | ORAL_CAPSULE | Freq: Four times a day (QID) | ORAL | 0 refills | Status: DC

## 2022-12-16 MED ORDER — CEPHALEXIN 500 MG PO CAPS
1000.0000 mg | ORAL_CAPSULE | Freq: Once | ORAL | Status: AC
  Administered 2022-12-16: 1000 mg via ORAL
  Filled 2022-12-16: qty 2

## 2022-12-16 NOTE — ED Provider Notes (Signed)
[place] EMERGENCY DEPARTMENT AT [facility] Provider Note   CSN: [id number] Arrival date & time: 12/15/22  1622     History  Chief Complaint  Patient presents with   Vaginal Bleeding    [name] is a 67 y.o. female.  Chief complaint and nursing note state vaginal bleeding but patient is adamant it is hematuria. Been going on for a couple days. No clots. No light headedness. No fevers. Does have some left sided flank pain to go along with it. Recently finished Nitrofurantoin for UTI.    Vaginal Bleeding      Home Medications Prior to Admission medications   Medication Sig Start Date End Date Taking? Authorizing Provider  cephALEXin (KEFLEX) 500 MG capsule Take 1 capsule (500 mg total) by mouth 4 (four) times daily. 12/16/22  Yes Omario Ander, Barbara Cower, MD  albuterol (PROAIR HFA) 108 (90 Base) MCG/ACT inhaler INHALE TWO PUFFS INTO THE LUNGS EVERY 6 HOURS AS NEEDED FOR SHORTNESS OF BREATH 12/18/20   [name], [name], MD  albuterol (PROVENTIL) (2.5 MG/3ML) 0.083% nebulizer solution USE 1 VIAL IN NEBULIZER EVERY 6 HOURS AS NEEDED FOR SHORTNESS OF BREATH 01/18/21   [name], [name], MD  ALLERGY RELIEF 10 MG tablet TAKE ONE TABLET BY MOUTH DAILY 07/22/21   [name], [name], MD  allopurinol (ZYLOPRIM) 100 MG tablet Take 100 mg by mouth daily.    [provider]  ALPRAZolam Prudy Feeler) 0.25 MG tablet Take 0.25 mg by mouth 2 (two) times daily. 07/14/22   [provider]  aspirin EC 81 MG tablet Take 81 mg by mouth daily. Swallow whole.    [provider]  Azelastine HCl (ASTEPRO) 0.15 % SOLN Place 1 spray into the nose 2 (two) times daily as needed (Allergies). 01/13/22   Coralyn Helling, MD  Cholecalciferol (VITAMIN D3) 50 MCG (2000 UT) capsule TAKE ONE CAPSULE BY MOUTH DAILY 03/27/21   [name], Tawanna Cooler, MD  cyclobenzaprine (FLEXERIL) 10 MG tablet Take 10 mg by mouth at bedtime as needed for muscle spasms.    [provider]   famotidine (PEPCID) 40 MG tablet Take 40 mg by mouth at bedtime. 07/25/22   [provider]  ferrous sulfate 324 (65 Fe) MG TBEC TAKE ONE TABLET BY MOUTH EVERY OTHER DAY 05/20/21   [name], Tawanna Cooler, MD  Fluticasone-Umeclidin-Vilant (TRELEGY ELLIPTA) 100-62.5-25 MCG/ACT AEPB Inhale 1 puff into the lungs daily in the afternoon. 07/10/22   Coralyn Helling, MD  gabapentin (NEURONTIN) 100 MG capsule Take 1 capsule (100 mg total) by mouth at bedtime. Patient taking differently: Take 100 mg by mouth 3 (three) times daily. 11/12/20   [name], Tawanna Cooler, MD  guaiFENesin (MUCINEX) 600 MG 12 hr tablet Take 2 tablets (1,200 mg total) by mouth 2 (two) times daily as needed. 07/10/22   Coralyn Helling, MD  ibuprofen (ADVIL) 600 MG tablet Take 600 mg by mouth every 8 (eight) hours as needed for mild pain.    [provider]  isosorbide mononitrate (IMDUR) 60 MG 24 hr tablet Take 60 mg by mouth 2 (two) times daily.    [provider]  lamoTRIgine (LAMICTAL) 200 MG tablet TAKE 1 AND 1/2 TABLETS BY MOUTH TWICE DAILY 07/22/22   Windell Norfolk, MD  montelukast (SINGULAIR) 10 MG tablet TAKE ONE TABLET BY MOUTH AT BEDTIME 06/27/22   Coralyn Helling, MD  OXYGEN Inhale 3 L into the lungs continuous.    [provider]  pantoprazole (PROTONIX) 40 MG tablet Take 1 tablet (40 mg total) by mouth 2 (two)  times daily. **PLEASE CALL OFFICE TO SCHEDULE FOLLOW UP 01/29/22   Unk Lightning, PA  QUEtiapine (SEROQUEL) 100 MG tablet TAKE ONE TABLET BY MOUTH AT BEDTIME 11/24/22   Windell Norfolk, MD  simvastatin (ZOCOR) 40 MG tablet TAKE ONE TABLET BY MOUTH AT BEDTIME 03/18/21   [name], [name], MD  tiZANidine (ZANAFLEX) 4 MG tablet TAKE ONE TABLET BY MOUTH EVERY 6 HOURS AS NEEDED FOR MUSCLE SPASMS FOR UP TO 30 DOSES 07/10/22   Windell Norfolk, MD  topiramate (TOPAMAX) 100 MG tablet Take 200 mg twice daily 09/29/22   Windell Norfolk, MD  traZODone (DESYREL) 50 MG tablet Take 50 mg by  mouth at bedtime.    [provider]  XARELTO 20 MG TABS tablet Take 20 mg by mouth daily. 07/25/22   [provider]      Allergies    Amoxicillin, Azithromycin, Cefdinir, Chantix [varenicline], Doxycycline hyclate, Levofloxacin, Penicillins, Sulfonamide derivatives, Toradol [ketorolac tromethamine], Clarithromycin, Nortriptyline, Paxlovid [nirmatrelvir-ritonavir], Codeine, Fluticasone-salmeterol, and Triptans    Review of Systems   Review of Systems  Genitourinary:  Positive for vaginal bleeding.    Physical Exam Updated Vital Signs BP 108/65 (BP Location: Right Arm)   Pulse 95   Temp 98.4 F (36.9 C) (Oral)   Resp 17   Ht 5\' 1"  (1.549 m)   Wt 75.3 kg   SpO2 97%   BMI 31.37 kg/m  Physical Exam Vitals and nursing note reviewed.  Constitutional:      Appearance: She is well-developed.  HENT:     Head: Normocephalic and atraumatic.     Nose: Nose normal. No congestion or rhinorrhea.     Mouth/Throat:     Mouth: Mucous membranes are moist.  Eyes:     Pupils: Pupils are equal, round, and reactive to light.  Cardiovascular:     Rate and Rhythm: Normal rate and regular rhythm.  Pulmonary:     Effort: No respiratory distress.     Breath sounds: No stridor.  Abdominal:     General: Abdomen is flat. There is no distension.  Musculoskeletal:        General: No swelling or tenderness. Normal range of motion.     Cervical back: Normal range of motion.  Skin:    General: Skin is warm and dry.  Neurological:     General: No focal deficit present.     Mental Status: She is alert.     ED Results / Procedures / Treatments   Labs (all labs ordered are listed, but only abnormal results are displayed) Labs Reviewed  CBC WITH DIFFERENTIAL/PLATELET - Abnormal; Notable for the following components:      Result Value   WBC 14.0 (*)    RBC 3.67 (*)    Hemoglobin 11.3 (*)    Neutro Abs 10.6 (*)    Monocytes Absolute 1.3 (*)    All other components within  normal limits  URINALYSIS, ROUTINE W REFLEX MICROSCOPIC - Abnormal; Notable for the following components:   Color, Urine RED (*)    APPearance CLOUDY (*)    Hgb urine dipstick LARGE (*)    Protein, ur 100 (*)    All other components within normal limits  BASIC METABOLIC PANEL  TYPE AND SCREEN    EKG None  Radiology CT ABDOMEN PELVIS W CONTRAST  Result Date: 12/16/2022 CLINICAL DATA:  Abdominal pain, acute, nonlocalized. Vaginal bleeding for 2 weeks. EXAM: CT ABDOMEN AND PELVIS WITH CONTRAST TECHNIQUE: Multidetector CT imaging of the abdomen and pelvis was  performed using the standard protocol following bolus administration of intravenous contrast. RADIATION DOSE REDUCTION: This exam was performed according to the departmental dose-optimization program which includes automated exposure control, adjustment of the mA and/or kV according to patient size and/or use of iterative reconstruction technique. CONTRAST:  OMNIPAQUE IOHEXOL 300 MG/ML  SOLN COMPARISON:  08/05/2022. FINDINGS: Lower chest: Scattered reticular prominence and airspace opacities are noted at the lung bases, improved from the previous exam. Hepatobiliary: No focal liver abnormality is seen. No gallstones, gallbladder wall thickening, or biliary dilatation. Pancreas: Fatty atrophy of the pancreas is noted. No pancreatic ductal dilatation or surrounding inflammatory changes. Spleen: Normal in size without focal abnormality. Adrenals/Urinary Tract: The adrenal glands are within normal limits. The kidneys enhance symmetrically. Renal calculi are noted on the left. An 8 mm calculus is noted in the renal pelvis on the left with no obstructive uropathy. There is fat stranding an urothelial thickening involving the left renal pelvis and proximal left ureter. The bladder is unremarkable. Stomach/Bowel: Stomach is within normal limits. Appendix appears normal. No evidence of bowel wall thickening, distention, or inflammatory changes. No free  air or pneumatosis. Multiple scattered diverticula are present along the colon without evidence of diverticulitis. Vascular/Lymphatic: Aortic atherosclerosis. No enlarged abdominal or pelvic lymph nodes. Reproductive: Status post hysterectomy. No adnexal masses. Other: No abdominopelvic ascites. A small fat containing umbilical hernia is present. Musculoskeletal: Degenerative changes are present in the thoracolumbar spine. No acute osseous abnormality. IMPRESSION: 1. 8 mm calculus in the left renal pelvis with no evidence of obstruction. Urothelial thickening is noted at the left renal pelvis and proximal left ureter which may be infectious or inflammatory. 2. Nonobstructive left renal calculus. 3. Scattered airspace opacities at the lung bases, improved from the previous exam and may be infectious or inflammatory. 4. Aortic atherosclerosis. Electronically Signed   By: Thornell Sartorius M.D.   On: 12/16/2022 02:36    Procedures Procedures    Medications Ordered in ED Medications  iohexol (OMNIPAQUE) 300 MG/ML solution 100 mL (100 mLs Intravenous Contrast Given 12/16/22 0208)  cephALEXin (KEFLEX) capsule 1,000 mg (1,000 mg Oral Given 12/16/22 0417)    ED Course/ Medical Decision Making/ A&P                             Medical Decision Making Amount and/or Complexity of Data Reviewed Labs: ordered. Radiology: ordered.  Risk Prescription drug management.   Hematuria. Possible UTI? Does have a kidney stone but still in the renal pelvis so shouldn't be causing symptoms or hematuria. Either way has a urologist she will follow up with to ensure improvement.    Final Clinical Impression(s) / ED Diagnoses Final diagnoses:  Hematuria, unspecified type  Nephrolithiasis    Rx / DC Orders ED Discharge Orders          Ordered    cephALEXin (KEFLEX) 500 MG capsule  4 times daily        12/16/22 0437              Chasen Mendell, Barbara Cower, MD 12/16/22 0700

## 2022-12-16 NOTE — ED Notes (Signed)
Patient transported to CT 

## 2022-12-17 ENCOUNTER — Encounter (HOSPITAL_COMMUNITY): Payer: Self-pay | Admitting: Speech Pathology

## 2022-12-17 ENCOUNTER — Ambulatory Visit (HOSPITAL_COMMUNITY): Payer: 59 | Attending: Gastroenterology | Admitting: Speech Pathology

## 2022-12-17 ENCOUNTER — Ambulatory Visit (HOSPITAL_COMMUNITY)
Admission: RE | Admit: 2022-12-17 | Discharge: 2022-12-17 | Disposition: A | Discharge status: Home / Self Care | Payer: 59 | Source: Ambulatory Visit | Attending: Gastroenterology | Admitting: Gastroenterology

## 2022-12-17 DIAGNOSIS — R1312 Dysphagia, oropharyngeal phase: Secondary | ICD-10-CM | POA: Diagnosis not present

## 2022-12-17 DIAGNOSIS — K219 Gastro-esophageal reflux disease without esophagitis: Secondary | ICD-10-CM

## 2022-12-17 NOTE — Therapy (Signed)
[facility] Outpatient Rehabilitation at [facility] [address] [place], [place], [address] Phone: [phone]   Fax:  [phone]  Modified Barium Swallow  Patient Details  Name: [name] MRN: [id number] Date of Birth: January 11, 1956 No data recorded  Encounter Date: 12/17/2022   End of Session - 12/17/22 1639     Visit Number 1    Number of Visits 1    Activity Tolerance Patient tolerated treatment well            Patient Details  Name: [name] MRN: [id number] Date of Birth: [birth year]  Today's Date: 12/17/2022  HPI/PMH: HPI: Pt is a 67 yo female referred for OP MBS by GI due to reports of worsening symptoms of dysphagia; Pt has lost ~11lbs in the past month and reports 5 episodes of PNAin the last year. Most recent episode was in January of 2024. Pt describes trouble with solids and liquids and the sensation of solids getting "stuck" and getting "strangled" on liquids. Pt had MBS in July 2022 with almost identical results to her previous MBSS in April 2021 reporting mistiming resulted in penetration of thin and nectar thick liquids that was not fully cleared despite strategies (although a breath hold was most successful). PMH includes: GERD, PNA, OSA, COPD, CHF, hiatal hernia, DM. Pt is chronically on O2.   Clinical Impression: Pt presents with mild pharyngeal dysphagia characterized by TRACE penetration of thin and nectar liquids to the level of the vocal folds. Slightly worse presentation today from last MBSS July of 2022 as most penetration was cleared reflexively at that time, whereas today most/all presentations result in trace amouts of penetration during/after the swallow which fall to the true vocal cords. Also note diminished pharyngeal stripping wave resulting in increased pharyngeal residue visualized on the study today. Penetrates are sometimes sensed and reflexively cleared and most of the time NOT sensed and remain there until a cued cough or cued repeat  swallow. With thin liquids attempted effortful swallow, chin tuck, Left head turn, right head turn and supraglottic swallow and none were effective for minimizing/eliminating penetration. Honey presentation was not penetrated, however, question if trace amounts of residue once mixed with secretions would also be penetrated with additional sips. Esophageal sweep revealed brief standing column of barium containing the barium tablet but tablet did clear the esophagus requiring additional time and puree bolus to do so, radiologist present to confirm. No aspiration was visualized however cannot ensure that trace amounts on the true vocal cords are not occasionally aspirated. Further note, Pt has many complicating factors including COPD, compromised respiratory status and reflux increasing her risk for aspiration. Pt reports difficulty with meats and breads; SLP provided recommendation to continue with thin  liquids with intermittent throat clear and continue with regular diet with limitations (NO BREAD, mech soft meats); Reviewed the importance of oral hygene. Also recommed HH ST to completed dysphagia assessment in the home environment and provide further recommendations and strengthening dysphagia therapy. Study and recommendations reviewed at length with Pt and Pt's husband. Thank you for this referral,  Factors that may increase risk of adverse event in presence of aspiration [name] & Clearance [name] 2021): Factors that may increase risk of adverse event in presence of aspiration [name] & Clearance [name] 2021): Poor general health and/or compromised immunity; Limited mobility; Frail or deconditioned   Recommendations/Plan: Swallowing Evaluation Recommendations Swallowing Evaluation Recommendations Recommendations: PO diet PO Diet Recommendation: Dysphagia 3 (Mechanical soft); Regular; Thin liquids (Level 0) Liquid Administration via: Cup; Straw  Medication Administration: Whole meds with puree Supervision: Patient able  to self-feed Swallowing strategies  : Slow rate; Small bites/sips; Clear throat intermittently Postural changes: Position pt fully upright for meals Oral care recommendations: Oral care BID (2x/day)    Treatment Plan Treatment Plan Treatment recommendations: Defer treatment plan to SLP at other venue (see follow-up recommendations) Follow-up recommendations: Home health SLP     Recommendations Recommendations for follow up therapy are one component of a multi-disciplinary discharge planning process, led by the attending physician.  Recommendations may be updated based on patient status, additional functional criteria and insurance authorization.  Assessment: Orofacial Exam: No data recorded  Anatomy:  Anatomy: WFL   Boluses Administered: Boluses Administered Boluses Administered: Thin liquids (Level 0); Mildly thick liquids (Level 2, nectar thick); Moderately thick liquids (Level 3, honey thick); Puree; Solid     Oral Impairment Domain: Oral Impairment Domain Lip Closure: No labial escape Tongue control during bolus hold: Cohesive bolus between tongue to palatal seal Bolus preparation/mastication: Timely and efficient chewing and mashing Bolus transport/lingual motion: Brisk tongue motion Oral residue: Trace residue lining oral structures Location of oral residue : Tongue Initiation of pharyngeal swallow : Pyriform sinuses     Pharyngeal Impairment Domain: Pharyngeal Impairment Domain Soft palate elevation: Trace column of contrast or air between SP and PW Laryngeal elevation: Partial superior movement of thyroid cartilage/partial approximation of arytenoids to epiglottic petiole Anterior hyoid excursion: Partial anterior movement Epiglottic movement: Partial inversion Laryngeal vestibule closure: Incomplete, narrow column air/contrast in laryngeal vestibule Pharyngeal stripping wave : Present - diminished Pharyngoesophageal segment opening: Complete distension and  complete duration, no obstruction of flow Tongue base retraction: Trace column of contrast or air between tongue base and PPW Pharyngeal residue: Trace residue within or on pharyngeal structures Location of pharyngeal residue: Tongue base; Valleculae; Pharyngeal wall; Diffuse (>3 areas)     Esophageal Impairment Domain: No data recorded  Pill: No data recorded  Penetration/Aspiration Scale Score: Penetration/Aspiration Scale Score 3.  Material enters airway, remains ABOVE vocal cords and not ejected out: Thin liquids (Level 0) 4.  Material enters airway, CONTACTS cords then ejected out: Thin liquids (Level 0) 5.  Material enters airway, CONTACTS cords and not ejected out: Thin liquids (Level 0) 6.  Material enters airway, passes BELOW cords then ejected out: Thin liquids (Level 0)    Compensatory Strategies: Compensatory Strategies Compensatory strategies: Yes Effortful swallow: Ineffective Ineffective Effortful Swallow: Thin liquid (Level 0) Chin tuck: Ineffective Ineffective Chin Tuck: Thin liquid (Level 0) Left head turn: Ineffective Ineffective Left Head Turn: Thin liquid (Level 0) Right head turn: Ineffective Ineffective Right Head Turn: Thin liquid (Level 0) Supraglottic swallow: Ineffective Ineffective Supraglottic Swallow: Thin liquid (Level 0)       General Information: Caregiver present: Yes   Diet Prior to this Study: Regular; Thin liquids (Level 0)    No data recorded   No data recorded   Supplemental O2: Nasal cannula    History of Recent Intubation: No   Behavior/Cognition: Alert; Cooperative; Pleasant mood  Self-Feeding Abilities: Able to self-feed  Baseline vocal quality/speech: Normal  Volitional Cough: Able to elicit  Volitional Swallow: Able to elicit  No data recorded  Goal Planning: No data recorded No data recorded No data recorded No data recorded No data recorded  Pain: No data recorded  End of Session: Start Time:No  data recorded Stop Time: No data recorded Time Calculation:No data recorded Charges: No data recorded SLP visit diagnosis: SLP Visit Diagnosis: Dysphagia, unspecified (R13.10)  Past Medical History:  Past Medical History:  Diagnosis Date   Allergic rhinitis    Arthritis    "hands and legs and feet" (04/27/2012)   Asthma    Cancer (HCC) 2005   rt arm mole rem cancer   Chronic bronchitis (HCC)    "keep it all the time" (04/27/2012)   COPD (chronic obstructive pulmonary disease) (HCC)    COVID-19 virus infection    03-2021   Depression    PT DENIES   Diverticulosis    DJD (degenerative joint disease)    Emphysema    Epilepsy (HCC)    Esophageal stricture    Exertional dyspnea    GERD (gastroesophageal reflux disease)    Glaucoma    H/O blood clots    Right leg   Heart failure (HCC)    History of COVID-19 03/2021   Hyperlipidemia    Hypertension    IBS (irritable bowel syndrome)    Migraine    Neck pain    Cervical disc   OSA on CPAP    6-7 yrs; pt wears CPAP but has not been able to use it lately due to sinus issues   Oxygen deficiency    USES 3 LITERS 02 CONTINOUS   Pneumonia 2012; 04/27/2012   Recurrent upper respiratory infection (URI)    Right leg DVT (HCC) 1982   groin   Seizures (HCC)    LAST SEIZURE 6 MONTHS OR  LONGER   Sleep apnea    Type II diabetes mellitus (HCC)    No meds since weight loss   UTI (urinary tract infection)    2 weeks ago   Vertigo 10/24/2014   Past Surgical History:  Past Surgical History:  Procedure Laterality Date   ABDOMINAL HYSTERECTOMY  2001   "partial"   BIOPSY  04/16/2021   Procedure: BIOPSY;  Surgeon: Napoleon Form, MD;  Location: [facility];  Service: Endoscopy;;   CARPAL TUNNEL RELEASE  ~ 2008   right   CATARACT EXTRACTION Bilateral    COLONOSCOPY     COLONOSCOPY WITH ESOPHAGOGASTRODUODENOSCOPY (EGD)     ESOPHAGOGASTRODUODENOSCOPY (EGD) WITH PROPOFOL N/A 04/16/2021   Procedure: ESOPHAGOGASTRODUODENOSCOPY  (EGD) WITH PROPOFOL;  Surgeon: Napoleon Form, MD;  Location: [facility];  Service: Endoscopy;  Laterality: N/A;   KNEE ARTHROSCOPY  1990's   left   NASAL SEPTOPLASTY W/ TURBINOPLASTY Bilateral 05/23/2016   Procedure: NASAL SEPTOPLASTY WITH TURBINATE REDUCTION;  Surgeon: Osborn Coho, MD;  Location: [facility] OR;  Service: ENT;  Laterality: Bilateral;   SHOULDER OPEN ROTATOR CUFF REPAIR  ~ 2010   left   SINOSCOPY     SINUS ENDO WITH FUSION Right 05/23/2016   Procedure: LIMITED RIGHT ENDOSCOPIC SINUS SURGERY;  Surgeon: Osborn Coho, MD;  Location: [facility] OR;  Service: ENT;  Laterality: Right;   TOTAL KNEE ARTHROPLASTY  08/23/2012   right   TOTAL KNEE ARTHROPLASTY  08/23/2012   Procedure: TOTAL KNEE ARTHROPLASTY;  Surgeon: Nilda Simmer, MD;  Location: [facility] OR;  Service: Orthopedics;  Laterality: Right;  RIGHT KNEE ARTHROPLASTY MEDIAL AND LATERAL COMPARTMENTS WITH PATELLA RESURFACING   TUBAL LIGATION  2001   UPPER GASTROINTESTINAL ENDOSCOPY         Patient will benefit from skilled therapeutic intervention in order to improve the following deficits and impairments:   Dysphagia, oropharyngeal phase        Problem List Patient Active Problem List   Diagnosis Date Noted   CAP (community acquired pneumonia) 08/06/2022   Acute metabolic encephalopathy  08/06/2022   Hypokalemia 08/06/2022   Acute respiratory failure with hypoxia (HCC) 08/05/2022   Candida infection, esophageal (HCC)    Ventricular diastolic dysfunction determined by echocardiography 04/12/2021   Multiple allergies 04/12/2021   Pressure injury of ankle, stage 2 (HCC) 01/31/2021   Dehydration 01/18/2021   Aspiration pneumonia (HCC) 12/30/2020   Dysphagia 12/20/2020   Radiculopathy affecting upper extremity 11/12/2020   Encounter for wheelchair assessment 05/16/2020   Shortness of breath 07/26/2019   Chronic pain of left knee 12/06/2018   Tobacco dependence 07/02/2018   Elevated serum creatinine 06/01/2018    Generalized weakness 05/15/2017   Chronic bronchitis (HCC) 01/14/2017   Claudication (HCC) 04/09/2016   Deviated septum 02/06/2016    Class: Chronic   Nasal turbinate hypertrophy 02/06/2016   Seizure disorder (HCC) 11/12/2015   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 05/23/2015   Rhinitis, chronic 04/25/2015   Dizziness 10/24/2014   Pulmonary hypertension (HCC) 08/06/2013   Diastolic CHF (HCC) 08/06/2013   BPPV (benign paroxysmal positional vertigo) 03/09/2013   Chronic hypoxemic respiratory failure (HCC) 11/01/2012   Left shoulder pain 06/27/2012   Headache 05/09/2012   Anxiety state 02/13/2010   Obstructive sleep apnea 02/19/2009   Vitamin D deficiency 01/21/2008   OSTEOPENIA 11/24/2007   Irritable bowel syndrome 11/17/2007   COPD GOLD 3 09/24/2007   Hyperlipidemia 10/08/2006   OBESITY, NOS  BMI 33.8 10/08/2006   GLAUCOMA 10/08/2006   GERD (gastroesophageal reflux disease) 10/08/2006   HERNIA, HIATAL, NONCONGENITAL 10/08/2006   INCONTINENCE, STRESS, FEMALE 10/08/2006   DJD (degenerative joint disease) of knee 10/08/2006   [name], CCC-SLP Speech Language Pathologist   [name], CCC-SLP 12/17/2022, 4:40 PM  [facility] Outpatient Rehabilitation at [facility] [address] [place], [place], [address] Phone: [phone]   Fax:  [phone]  Name: [name] MRN: [id number] Date of Birth: 1956/08/10

## 2022-12-18 ENCOUNTER — Telehealth: Payer: Self-pay | Admitting: Pulmonary Disease

## 2022-12-18 ENCOUNTER — Encounter: Payer: Self-pay | Admitting: Urology

## 2022-12-18 ENCOUNTER — Ambulatory Visit (INDEPENDENT_AMBULATORY_CARE_PROVIDER_SITE_OTHER): Payer: 59 | Admitting: Urology

## 2022-12-18 VITALS — BP 92/65 | HR 97 | Ht 61.0 in | Wt 166.0 lb

## 2022-12-18 DIAGNOSIS — N200 Calculus of kidney: Secondary | ICD-10-CM

## 2022-12-18 DIAGNOSIS — R310 Gross hematuria: Secondary | ICD-10-CM | POA: Diagnosis not present

## 2022-12-18 DIAGNOSIS — Z87440 Personal history of urinary (tract) infections: Secondary | ICD-10-CM | POA: Diagnosis not present

## 2022-12-18 LAB — URINALYSIS, ROUTINE W REFLEX MICROSCOPIC
Specific Gravity, UA: 1.02 (ref 1.005–1.030)
pH, UA: 5 (ref 5.0–7.5)

## 2022-12-18 NOTE — Progress Notes (Signed)
Subjective: 1. Renal stone   2. Personal history of urinary infection   3. Gross hematuria      Consult requested by ER   [name] is a 67 yo female who has a 2 week history of hematuria with some LLQ pain.  She was in the ER on Monday night and was found to have an 8mm left renal pelvic stones without obstruction.  She has had no dysuria.  She had temp of 100 last night.  She has a history of UTI's.  Her UA in the ER had >50 RBC's and 6-10 WBC.  Her WBC was 14 with a Hgb of 11.3.   Her Cr is 0.96.  She is on Xarelto for a history of DVT.  She has been on topiramate for a couple of years.   She was given keflex in the ER.   Her urine remains bloody.   She has multiple antibiotic allergies.  ROS:  Review of Systems  Constitutional:  Positive for chills.  Respiratory:  Positive for cough.   Musculoskeletal:  Positive for back pain and joint pain.  Neurological:  Positive for dizziness, weakness and headaches.  Psychiatric/Behavioral:  Positive for memory loss.     Allergies  Allergen Reactions   Amoxicillin Anaphylaxis    Breakout, mouth swells   Azithromycin Swelling and Rash    "swelling all over, hives, thrush"   Cefdinir     Throat swelling after 3rd dose   Chantix [Varenicline] Other (See Comments)    "bad dreams, difficulty breathing"   Doxycycline Hyclate Anaphylaxis and Rash    swelling   Levofloxacin Anaphylaxis    Pt can take moxifloxacin   Penicillins Anaphylaxis, Shortness Of Breath and Rash    "ALMOST DIED, ended up in hospital"  PCN reaction causing immediate rash, facial/tongue/throat swelling, SOB or lightheadedness with hypotension: YES PCN reaction causing severe rash involving mucus membranes or skin necrosis: NO PCN reaction that required hospitalization YES Has patient had a PCN reaction occurring within the last 10 years: NO    Sulfonamide Derivatives Shortness Of Breath, Swelling and Rash    "break out from head to toe"   Toradol [Ketorolac Tromethamine]  Other (See Comments)    Seizure    Clarithromycin Hives    swelling   Nortriptyline Swelling, Rash and Other (See Comments)    Per patient had "kidney problems" on this medication, could not use bathroom (urinary retention)   Paxlovid [Nirmatrelvir-Ritonavir] Swelling    Throat swelling   Codeine Nausea And Vomiting   Fluticasone-Salmeterol Other (See Comments)    REACTION: ulcers in mouth   Triptans Rash    Mouth breaks out and start itching    Past Medical History:  Diagnosis Date   Allergic rhinitis    Arthritis    "hands and legs and feet" (04/27/2012)   Asthma    Cancer (HCC) 2005   rt arm mole rem cancer   Chronic bronchitis (HCC)    "keep it all the time" (04/27/2012)   COPD (chronic obstructive pulmonary disease) (HCC)    COVID-19 virus infection    03-2021   Depression    PT DENIES   Diverticulosis    DJD (degenerative joint disease)    Emphysema    Epilepsy (HCC)    Esophageal stricture    Exertional dyspnea    GERD (gastroesophageal reflux disease)    Glaucoma    H/O blood clots    Right leg   Heart failure (HCC)    History  of COVID-19 03/2021   Hyperlipidemia    Hypertension    IBS (irritable bowel syndrome)    Migraine    Neck pain    Cervical disc   OSA on CPAP    6-7 yrs; pt wears CPAP but has not been able to use it lately due to sinus issues   Oxygen deficiency    USES 3 LITERS 02 CONTINOUS   Pneumonia 2012; 04/27/2012   Recurrent upper respiratory infection (URI)    Right leg DVT (HCC) 1982   groin   Seizures (HCC)    LAST SEIZURE 6 MONTHS OR  LONGER   Sleep apnea    Type II diabetes mellitus (HCC)    No meds since weight loss   UTI (urinary tract infection)    2 weeks ago   Vertigo 10/24/2014    Past Surgical History:  Procedure Laterality Date   ABDOMINAL HYSTERECTOMY  2001   "partial"   BIOPSY  04/16/2021   Procedure: BIOPSY;  Surgeon: Napoleon Form, MD;  Location: [facility];  Service: Endoscopy;;   CARPAL TUNNEL  RELEASE  ~ 2008   right   CATARACT EXTRACTION Bilateral    COLONOSCOPY     COLONOSCOPY WITH ESOPHAGOGASTRODUODENOSCOPY (EGD)     ESOPHAGOGASTRODUODENOSCOPY (EGD) WITH PROPOFOL N/A 04/16/2021   Procedure: ESOPHAGOGASTRODUODENOSCOPY (EGD) WITH PROPOFOL;  Surgeon: Napoleon Form, MD;  Location: [facility];  Service: Endoscopy;  Laterality: N/A;   KNEE ARTHROSCOPY  1990's   left   NASAL SEPTOPLASTY W/ TURBINOPLASTY Bilateral 05/23/2016   Procedure: NASAL SEPTOPLASTY WITH TURBINATE REDUCTION;  Surgeon: Osborn Coho, MD;  Location: [facility] OR;  Service: ENT;  Laterality: Bilateral;   SHOULDER OPEN ROTATOR CUFF REPAIR  ~ 2010   left   SINOSCOPY     SINUS ENDO WITH FUSION Right 05/23/2016   Procedure: LIMITED RIGHT ENDOSCOPIC SINUS SURGERY;  Surgeon: Osborn Coho, MD;  Location: [facility] OR;  Service: ENT;  Laterality: Right;   TOTAL KNEE ARTHROPLASTY  08/23/2012   right   TOTAL KNEE ARTHROPLASTY  08/23/2012   Procedure: TOTAL KNEE ARTHROPLASTY;  Surgeon: Nilda Simmer, MD;  Location: [facility] OR;  Service: Orthopedics;  Laterality: Right;  RIGHT KNEE ARTHROPLASTY MEDIAL AND LATERAL COMPARTMENTS WITH PATELLA RESURFACING   TUBAL LIGATION  2001   UPPER GASTROINTESTINAL ENDOSCOPY      Social History   Socioeconomic History   Marital status: Married    Spouse name: Westly Pam   Number of children: 2   Years of education: 12   Highest education level: Not on file  Occupational History   Occupation: disabled  Tobacco Use   Smoking status: Every Day    Packs/day: 0.50    Years: 40.00    Additional pack years: 0.00    Total pack years: 20.00    Types: Cigarettes    Start date: 08/12/1975    Last attempt to quit: 12/30/2020    Years since quitting: 1.9   Smokeless tobacco: Never   Tobacco comments:    1-2 CIGS A DAY NOW   Vaping Use   Vaping Use: Never used  Substance and Sexual Activity   Alcohol use: No    Alcohol/week: 0.0 standard drinks of alcohol   Drug use: No   Sexual activity: Never   Other Topics Concern   Not on file  Social History Narrative   Patient lives at home with husband Westly Pam, her son and his wife.    Patient has 2 children.    Patient has a  high school education.    Patient is currently unemployed.    Patient is right handed.    Patient drinks 2 cups of caffeine per day.   Social Determinants of Health   Financial Resource Strain: High Risk (09/13/2021)   Overall Financial Resource Strain (CARDIA)    Difficulty of Paying Living Expenses: Very hard  Food Insecurity: Food Insecurity Present (09/13/2021)   Hunger Vital Sign    Worried About Running Out of Food in the Last Year: Often true    Ran Out of Food in the Last Year: Often true  Transportation Needs: No Transportation Needs (09/13/2021)   PRAPARE - Administrator, Civil Service (Medical): No    Lack of Transportation (Non-Medical): No  Physical Activity: Inactive (08/13/2021)   Exercise Vital Sign    Days of Exercise per Week: 0 days    Minutes of Exercise per Session: 0 min  Stress: Not on file  Social Connections: Moderately Integrated (08/13/2021)   Social Connection and Isolation Panel [NHANES]    Frequency of Communication with Friends and Family: More than three times a week    Frequency of Social Gatherings with Friends and Family: More than three times a week    Attends Religious Services: Never    Database administrator or Organizations: Yes    Attends Banker Meetings: Never    Marital Status: Married  Catering manager Violence: Not on file    Family History  Problem Relation Age of Onset   Diabetes Mother    Lung cancer Mother        was a smoker   Stroke Mother    Alcohol abuse Father    Lung cancer Father        was a smoker   Emphysema Father        was a smoker   Heart disease Sister    Other Sister        blood clotting disorder   Diabetes Brother    Seizures Brother    Diabetes Maternal Aunt    Breast cancer Maternal Aunt    Esophageal cancer  Maternal Uncle    Diabetes Maternal Uncle    Heart disease Maternal Grandfather    Diabetes Maternal Grandfather    Bipolar disorder Son    Muscular dystrophy Son    Heart disease Son    Heart disease Other        uncle   Colon cancer Neg Hx    Pancreatic cancer Neg Hx    Stomach cancer Neg Hx    Colon polyps Neg Hx    Rectal cancer Neg Hx     Anti-infectives: Anti-infectives (From admission, onward)    None       Current Outpatient Medications  Medication Sig Dispense Refill   albuterol (PROAIR HFA) 108 (90 Base) MCG/ACT inhaler INHALE TWO PUFFS INTO THE LUNGS EVERY 6 HOURS AS NEEDED FOR SHORTNESS OF BREATH 18 g 6   albuterol (PROVENTIL) (2.5 MG/3ML) 0.083% nebulizer solution USE 1 VIAL IN NEBULIZER EVERY 6 HOURS AS NEEDED FOR SHORTNESS OF BREATH 75 mL 8   ALLERGY RELIEF 10 MG tablet TAKE ONE TABLET BY MOUTH DAILY 90 tablet 1   allopurinol (ZYLOPRIM) 100 MG tablet Take 100 mg by mouth daily.     ALPRAZolam (XANAX) 0.25 MG tablet Take 0.25 mg by mouth 2 (two) times daily.     aspirin EC 81 MG tablet Take 81 mg by mouth daily. Swallow whole.  Azelastine HCl (ASTEPRO) 0.15 % SOLN Place 1 spray into the nose 2 (two) times daily as needed (Allergies). 30 mL 5   cephALEXin (KEFLEX) 500 MG capsule Take 1 capsule (500 mg total) by mouth 4 (four) times daily. 40 capsule 0   Cholecalciferol (VITAMIN D3) 50 MCG (2000 UT) capsule TAKE ONE CAPSULE BY MOUTH DAILY 90 capsule 1   cyclobenzaprine (FLEXERIL) 10 MG tablet Take 10 mg by mouth at bedtime as needed for muscle spasms.     famotidine (PEPCID) 40 MG tablet Take 40 mg by mouth at bedtime.     ferrous sulfate 324 (65 Fe) MG TBEC TAKE ONE TABLET BY MOUTH EVERY OTHER DAY 90 tablet 5   Fluticasone-Umeclidin-Vilant (TRELEGY ELLIPTA) 100-62.5-25 MCG/ACT AEPB Inhale 1 puff into the lungs daily in the afternoon. 60 each 5   gabapentin (NEURONTIN) 100 MG capsule Take 1 capsule (100 mg total) by mouth at bedtime. (Patient taking differently:  Take 100 mg by mouth 3 (three) times daily.) 90 capsule 3   guaiFENesin (MUCINEX) 600 MG 12 hr tablet Take 2 tablets (1,200 mg total) by mouth 2 (two) times daily as needed. 60 tablet 5   ibuprofen (ADVIL) 600 MG tablet Take 600 mg by mouth every 8 (eight) hours as needed for mild pain.     isosorbide mononitrate (IMDUR) 60 MG 24 hr tablet Take 60 mg by mouth 2 (two) times daily.     lamoTRIgine (LAMICTAL) 200 MG tablet TAKE 1 AND 1/2 TABLETS BY MOUTH TWICE DAILY 270 tablet 1   montelukast (SINGULAIR) 10 MG tablet TAKE ONE TABLET BY MOUTH AT BEDTIME 30 tablet 5   OXYGEN Inhale 3 L into the lungs continuous.     pantoprazole (PROTONIX) 40 MG tablet Take 1 tablet (40 mg total) by mouth 2 (two) times daily. **PLEASE CALL OFFICE TO SCHEDULE FOLLOW UP 60 tablet 0   QUEtiapine (SEROQUEL) 100 MG tablet TAKE ONE TABLET BY MOUTH AT BEDTIME 30 tablet 5   simvastatin (ZOCOR) 40 MG tablet TAKE ONE TABLET BY MOUTH AT BEDTIME 90 tablet 2   tiZANidine (ZANAFLEX) 4 MG tablet TAKE ONE TABLET BY MOUTH EVERY 6 HOURS AS NEEDED FOR MUSCLE SPASMS FOR UP TO 30 DOSES 15 tablet 1   topiramate (TOPAMAX) 100 MG tablet Take 200 mg twice daily 360 tablet 2   traZODone (DESYREL) 50 MG tablet Take 50 mg by mouth at bedtime.     XARELTO 20 MG TABS tablet Take 20 mg by mouth daily.     No current facility-administered medications for this visit.     Objective: Vital signs in last 24 hours: BP 92/65   Pulse 97   Ht 5\' 1"  (1.549 m)   Wt 166 lb (75.3 kg)   BMI 31.37 kg/m   Intake/Output from previous day: No intake/output data recorded. Intake/Output this shift: @IOTHISSHIFT @   Physical Exam Vitals reviewed.  Constitutional:      Appearance: Normal appearance. She is obese.     Comments: In wheelchair.   Cardiovascular:     Rate and Rhythm: Normal rate and regular rhythm.  Pulmonary:     Effort: Pulmonary effort is normal. No respiratory distress.     Breath sounds: No wheezing or rhonchi.  Abdominal:      Palpations: Abdomen is soft.     Tenderness: There is abdominal tenderness (LUQ). There is left CVA tenderness.  Musculoskeletal:        General: No swelling or tenderness. Normal range of motion.  Skin:  General: Skin is warm and dry.  Neurological:     General: No focal deficit present.     Mental Status: She is alert and oriented to person, place, and time.     Motor: Weakness (generalized requiring a wheelchair.) present.  Psychiatric:        Mood and Affect: Mood normal.        Behavior: Behavior normal.     Lab Results:  No results found. However, due to the size of the patient record, not all encounters were searched. Please check Results Review for a complete set of results.  BMET Recent Labs    12/15/22 1720  NA 137  K 3.9  CL 104  CO2 23  GLUCOSE 99  BUN 23  CREATININE 0.96  CALCIUM 8.9   PT/INR No results for input(s): "LABPROT", "INR" in the last 72 hours. ABG No results for input(s): "PHART", "HCO3" in the last 72 hours.  Invalid input(s): "PCO2", "PO2"  Studies/Results: DG OP Swallowing Func-Medicare/Speech Path  Result Date: 12/17/2022 Table formatting from the original result was not included. Modified Barium Swallow Study Patient Details Name: [name] MRN: [id number] Date of Birth: 1956/01/28 Today's Date: 12/17/2022 HPI/PMH: HPI: Pt is a 67 yo female referred for OP MBS by GI due to reports of worsening symptoms of dysphagia; Pt has lost ~11lbs in the past month and reports 5 episodes of PNAin the last year. Most recent episode was in January of 2024. Pt describes trouble with solids and liquids and the sensation of solids getting "stuck" and getting "strangled" on liquids. Pt had MBS in July 2022 with almost identical results to her previous MBSS in April 2021 reporting mistiming resulted in penetration of thin and nectar thick liquids that was not fully cleared despite strategies (although a breath hold was most successful). PMH includes: GERD, PNA, OSA,  COPD, CHF, hiatal hernia, DM. Pt is chronically on O2. Clinical Impression: Pt presents with mild pharyngeal dysphagia characterized by TRACE penetration of thin and nectar liquids to the level of the vocal folds. Slightly worse presentation today from last MBSS July of 2022 as most penetration was cleared reflexively at that time, whereas today most/all presentations result in trace amouts of penetration during/after the swallow which fall to the true vocal cords. Also note diminished pharyngeal stripping wave resulting in increased pharyngeal residue visualized on the study today. Penetrates are sometimes sensed and reflexively cleared and most of the time NOT sensed and remain there until a cued cough or cued repeat swallow. Attempted multiple strategies, none were effective to improve laryngeal vestibule closure. Honey presentation was not penetrated, however, question if trace amounts of residue once mixed with secretions would also be penetrated with additional sips. Esophageal sweep revealed brief standing column of barium containing the barium tablet but tablet did clear the esophagus requiring additional time and puree bolus to do so, radiologist present to confirm. No aspiration was visualized however cannot ensure that trace amounts on the true vocal cords are not occasionally aspirated. Further note, Pt has many complicating factors including COPD, compromised respiratory status and reflux increasing her risk for aspiration. Pt reports difficulty with meats and breads; SLP provided recommendation to continue with thin  liquids with intermittent throat clear and continue with regular diet with limitations (NO BREAD, mech soft meats); Reviewed the importance of oral hygene. Also recommed HH ST to completed dysphagia assessment in the home environment and provide further recommendations and strengthening dysphagia therapy. Study and recommendations reviewed at length  with Pt and Pt's husband. Thank you for  this referral, Factors that may increase risk of adverse event in presence of aspiration Rubye Oaks & Clearance Coots 2021): Factors that may increase risk of adverse event in presence of aspiration Rubye Oaks & Clearance Coots 2021): Poor general health and/or compromised immunity; Limited mobility; Frail or deconditioned Recommendations/Plan: Swallowing Evaluation Recommendations Swallowing Evaluation Recommendations Recommendations: PO diet PO Diet Recommendation: Dysphagia 3 (Mechanical soft); Regular; Thin liquids (Level 0) Liquid Administration via: Cup; Straw Medication Administration: Whole meds with puree Supervision: Patient able to self-feed Swallowing strategies  : Slow rate; Small bites/sips; Clear throat intermittently Postural changes: Position pt fully upright for meals Oral care recommendations: Oral care BID (2x/day) Treatment Plan Treatment Plan Treatment recommendations: Defer treatment plan to SLP at other venue (see follow-up recommendations) Follow-up recommendations: Home health SLP Recommendations Recommendations for follow up therapy are one component of a multi-disciplinary discharge planning process, led by the attending physician.  Recommendations may be updated based on patient status, additional functional criteria and insurance authorization. Assessment: Orofacial Exam: No data recorded Anatomy: Anatomy: WFL Boluses Administered: Boluses Administered Boluses Administered: Thin liquids (Level 0); Mildly thick liquids (Level 2, nectar thick); Moderately thick liquids (Level 3, honey thick); Puree; Solid  Oral Impairment Domain: Oral Impairment Domain Lip Closure: No labial escape Tongue control during bolus hold: Cohesive bolus between tongue to palatal seal Bolus preparation/mastication: Timely and efficient chewing and mashing Bolus transport/lingual motion: Brisk tongue motion Oral residue: Trace residue lining oral structures Location of oral residue : Tongue Initiation of pharyngeal swallow : Pyriform  sinuses  Pharyngeal Impairment Domain: Pharyngeal Impairment Domain Soft palate elevation: Trace column of contrast or air between SP and PW Laryngeal elevation: Partial superior movement of thyroid cartilage/partial approximation of arytenoids to epiglottic petiole Anterior hyoid excursion: Partial anterior movement Epiglottic movement: Partial inversion Laryngeal vestibule closure: Incomplete, narrow column air/contrast in laryngeal vestibule Pharyngeal stripping wave : Present - diminished Pharyngoesophageal segment opening: Complete distension and complete duration, no obstruction of flow Tongue base retraction: Trace column of contrast or air between tongue base and PPW Pharyngeal residue: Trace residue within or on pharyngeal structures Location of pharyngeal residue: Tongue base; Valleculae; Pharyngeal wall; Diffuse (>3 areas)  Esophageal Impairment Domain: No data recorded Pill: No data recorded Penetration/Aspiration Scale Score: Penetration/Aspiration Scale Score 3.  Material enters airway, remains ABOVE vocal cords and not ejected out: Thin liquids (Level 0) 4.  Material enters airway, CONTACTS cords then ejected out: Thin liquids (Level 0) 5.  Material enters airway, CONTACTS cords and not ejected out: Thin liquids (Level 0) 6.  Material enters airway, passes BELOW cords then ejected out: Thin liquids (Level 0) Compensatory Strategies: Compensatory Strategies Compensatory strategies: Yes Effortful swallow: Ineffective Ineffective Effortful Swallow: Thin liquid (Level 0) Chin tuck: Ineffective Ineffective Chin Tuck: Thin liquid (Level 0) Left head turn: Ineffective Ineffective Left Head Turn: Thin liquid (Level 0) Right head turn: Ineffective Ineffective Right Head Turn: Thin liquid (Level 0) Supraglottic swallow: Ineffective Ineffective Supraglottic Swallow: Thin liquid (Level 0)   General Information: Caregiver present: Yes  Diet Prior to this Study: Regular; Thin liquids (Level 0)   No data recorded   No data recorded  Supplemental O2: Nasal cannula   History of Recent Intubation: No  Behavior/Cognition: Alert; Cooperative; Pleasant mood Self-Feeding Abilities: Able to self-feed Baseline vocal quality/speech: Normal Volitional Cough: Able to elicit Volitional Swallow: Able to elicit No data recorded Goal Planning: No data recorded No data recorded No data recorded No data recorded No data  recorded Pain: No data recorded End of Session: Start Time:No data recorded Stop Time: No data recorded Time Calculation:No data recorded Charges: No data recorded SLP visit diagnosis: SLP Visit Diagnosis: Dysphagia, unspecified (R13.10) Past Medical History: Past Medical History: Diagnosis Date  Allergic rhinitis   Arthritis   "hands and legs and feet" (04/27/2012)  Asthma   Cancer (HCC) 2005  rt arm mole rem cancer  Chronic bronchitis (HCC)   "keep it all the time" (04/27/2012)  COPD (chronic obstructive pulmonary disease) (HCC)   COVID-19 virus infection   03-2021  Depression   PT DENIES  Diverticulosis   DJD (degenerative joint disease)   Emphysema   Epilepsy (HCC)   Esophageal stricture   Exertional dyspnea   GERD (gastroesophageal reflux disease)   Glaucoma   H/O blood clots   Right leg  Heart failure (HCC)   History of COVID-19 03/2021  Hyperlipidemia   Hypertension   IBS (irritable bowel syndrome)   Migraine   Neck pain   Cervical disc  OSA on CPAP   6-7 yrs; pt wears CPAP but has not been able to use it lately due to sinus issues  Oxygen deficiency   USES 3 LITERS 02 CONTINOUS  Pneumonia 2012; 04/27/2012  Recurrent upper respiratory infection (URI)   Right leg DVT (HCC) 1982  groin  Seizures (HCC)   LAST SEIZURE 6 MONTHS OR  LONGER  Sleep apnea   Type II diabetes mellitus (HCC)   No meds since weight loss  UTI (urinary tract infection)   2 weeks ago  Vertigo 10/24/2014 Past Surgical History: Past Surgical History: Procedure Laterality Date  ABDOMINAL HYSTERECTOMY  2001  "partial"  BIOPSY  04/16/2021  Procedure: BIOPSY;   Surgeon: Napoleon Form, MD;  Location: [facility];  Service: Endoscopy;;  CARPAL TUNNEL RELEASE  ~ 2008  right  CATARACT EXTRACTION Bilateral   COLONOSCOPY    COLONOSCOPY WITH ESOPHAGOGASTRODUODENOSCOPY (EGD)    ESOPHAGOGASTRODUODENOSCOPY (EGD) WITH PROPOFOL N/A 04/16/2021  Procedure: ESOPHAGOGASTRODUODENOSCOPY (EGD) WITH PROPOFOL;  Surgeon: Napoleon Form, MD;  Location: [facility];  Service: Endoscopy;  Laterality: N/A;  KNEE ARTHROSCOPY  1990's  left  NASAL SEPTOPLASTY W/ TURBINOPLASTY Bilateral 05/23/2016  Procedure: NASAL SEPTOPLASTY WITH TURBINATE REDUCTION;  Surgeon: Osborn Coho, MD;  Location[facility] OR;  Service: ENT;  Laterality: Bilateral;  SHOULDER OPEN ROTATOR CUFF REPAIR  ~ 2010  left  SINOSCOPY    SINUS ENDO WITH FUSION Right 05/23/2016  Procedure: LIMITED RIGHT ENDOSCOPIC SINUS SURGERY;  Surgeon: Osborn Coho, MD;  Location: [facility] OR;  Service: ENT;  Laterality: Right;  TOTAL KNEE ARTHROPLASTY  08/23/2012  right  TOTAL KNEE ARTHROPLASTY  08/23/2012  Procedure: TOTAL KNEE ARTHROPLASTY;  Surgeon: Nilda Simmer, MD;  Location: [facility] OR;  Service: Orthopedics;  Laterality: Right;  RIGHT KNEE ARTHROPLASTY MEDIAL AND LATERAL COMPARTMENTS WITH PATELLA RESURFACING  TUBAL LIGATION  2001  UPPER GASTROINTESTINAL ENDOSCOPY   Amelia H. Romie Levee, CCC-SLP Speech Language Pathologist Georgetta Haber 12/17/2022, 4:49 PM CLINICAL DATA:  Dysphagia. Pneumonia 4 times this year EXAM: MODIFIED BARIUM SWALLOW TECHNIQUE: Different consistencies of barium were administered orally to the patient by the Speech Pathologist. Imaging of the pharynx was performed in the lateral projection. The radiologist was present in the fluoroscopy room for this study, providing personal supervision. FLUOROSCOPY: Radiation Exposure Index (as provided by the fluoroscopic device): 55.3 mGy Kerma COMPARISON:  None Available. FINDINGS: Vestibular Penetration: Flash laryngeal penetration with multiple swallows of thin barium and  nectar consistency. Contrast passed to  the level of the vocal cords. No change in penetration with chin tuck or head turn maneuver. Aspiration:  None seen. Other:  None. IMPRESSION: Laryngeal penetration without aspiration. Please refer to the Speech Pathologists report for complete details and recommendations. Electronically Signed   By: Ulyses Southward M.D.   On: 12/17/2022 15:04    Assessment/Plan: 8mm left renal stone without obstruction but with pain and hematuria.  Her stone is visible on the scout film but with her need for Xarelto, I think ureteroscopy would be more appropriate than ESWL.   I have reviewed the risks of bleeding, infection, ureteral and renal injury, need for a stent, thrombotic events and anesthetic complications.   She will need medical clearance prior to any procedures.   No orders of the defined types were placed in this encounter.    Orders Placed This Encounter  Procedures   Urine Culture   Urinalysis, Routine w reflex microscopic     Return for Next available schedule surgery..    CCColvin Caroli NP     Bjorn Pippin 12/18/2022

## 2022-12-18 NOTE — Telephone Encounter (Signed)
Patient states having symptoms of shortness of breath and cough. Would like Prednisone called into pharmacy. Pharmacy is [facility]. Patient phone number is [phone].

## 2022-12-19 ENCOUNTER — Telehealth: Payer: Self-pay | Admitting: Cardiovascular Disease

## 2022-12-19 ENCOUNTER — Other Ambulatory Visit: Payer: Self-pay | Admitting: Urology

## 2022-12-19 NOTE — Telephone Encounter (Signed)
I s/w the pt and informed her that we will need to mover her appt up sooner. I explained that I did not have anything sooner at [facility]. I offered the pt 12/23/22 @ 10:55 at [facility] with Tereso Newcomer, PAC. Pt is agreeable to appt change and location change for pre op clearance. I will update all parties involved.

## 2022-12-19 NOTE — Telephone Encounter (Signed)
   Pre-operative Risk Assessment    Patient Name: [name]  DOB: [birth year] MRN: [id number]      Request for Surgical Clearance    Procedure:   left retrograde pyelogram ureteroscopy razor left and stent  Date of Surgery:  Clearance 12/30/22                                 Surgeon:  Dr. Marshia Ly Group or Practice Name:  [facility] Phone number:  [phone] Fax number:  [phone]   Type of Clearance Requested:   - Medical  - Pharmacy:  Hold    Needs to know which medications the patient needs to hold and for how long   Type of Anesthesia:  General    Additional requests/questions:   Would like to have medical and pharmacy clearance  Signed, Tawni Millers   12/19/2022, 10:37 AM

## 2022-12-19 NOTE — Telephone Encounter (Signed)
   Name: [name]  DOB: 1956-04-07  MRN: [id number]  Primary Cardiologist: None  Chart reviewed as part of pre-operative protocol coverage. Because of Fabiola Corti Kliewer's past medical history and time since last visit, she will require a follow-up in-office visit in order to better assess preoperative cardiovascular risk.  Pre-op covering staff: - Please schedule appointment and call patient to inform them. If patient already had an upcoming appointment within acceptable timeframe, please add "pre-op clearance" to the appointment notes so provider is aware. - Please contact requesting surgeon's office via preferred method (i.e, phone, fax) to inform them of need for appointment prior to surgery.  Surgery is high risk for bleeding, so she will need to hold her ASA x 5-7 days. Unclear why she is taking ASA from a cardiac standpoint. Best guess is " thoracic aorta demonstrating mild atherosclerotic calcifications" on CT. No CAD.  Sharlene Dory, PA-C  12/19/2022, 12:37 PM

## 2022-12-20 ENCOUNTER — Other Ambulatory Visit: Payer: Self-pay | Admitting: Neurology

## 2022-12-20 ENCOUNTER — Other Ambulatory Visit: Payer: Self-pay | Admitting: Pulmonary Disease

## 2022-12-20 LAB — URINE CULTURE: Organism ID, Bacteria: NO GROWTH

## 2022-12-22 ENCOUNTER — Encounter: Payer: Self-pay | Admitting: Urology

## 2022-12-22 ENCOUNTER — Encounter (HOSPITAL_BASED_OUTPATIENT_CLINIC_OR_DEPARTMENT_OTHER): Payer: Self-pay | Admitting: Pulmonary Disease

## 2022-12-22 ENCOUNTER — Ambulatory Visit (INDEPENDENT_AMBULATORY_CARE_PROVIDER_SITE_OTHER): Payer: 59 | Admitting: Pulmonary Disease

## 2022-12-22 VITALS — BP 100/70 | HR 86 | Temp 97.7°F | Ht 61.0 in | Wt 167.2 lb

## 2022-12-22 DIAGNOSIS — J411 Mucopurulent chronic bronchitis: Secondary | ICD-10-CM | POA: Diagnosis not present

## 2022-12-22 DIAGNOSIS — J9611 Chronic respiratory failure with hypoxia: Secondary | ICD-10-CM

## 2022-12-22 DIAGNOSIS — J441 Chronic obstructive pulmonary disease with (acute) exacerbation: Secondary | ICD-10-CM

## 2022-12-22 DIAGNOSIS — Z01810 Encounter for preprocedural cardiovascular examination: Secondary | ICD-10-CM | POA: Insufficient documentation

## 2022-12-22 MED ORDER — PREDNISONE 10 MG PO TABS: ORAL_TABLET | ORAL | 0 refills | Status: AC

## 2022-12-22 NOTE — Progress Notes (Signed)
[facility]  Chief Complaint  Patient presents with   Follow-up    Follow up.     Past Surgical History:  She  has a past surgical history that includes Tubal ligation (2001); Knee arthroscopy (1990's); Carpal tunnel release (~ 2008); Shoulder open rotator cuff repair (~ 2010); Abdominal hysterectomy (2001); Total knee arthroplasty (08/23/2012); Total knee arthroplasty (08/23/2012); Cataract extraction (Bilateral); Colonoscopy with esophagogastroduodenoscopy (egd); Nasal septoplasty w/ turbinoplasty (Bilateral, 05/23/2016); Sinus endo with fusion (Right, 05/23/2016); Colonoscopy; Upper gastrointestinal endoscopy; Esophagogastroduodenoscopy (egd) with propofol (N/A, 04/16/2021); biopsy (04/16/2021); and Sinoscopy.  Past Medical History:  OA, COVID 29 March 2021, Diverticulosis, Epilepsy, GERD, Glaucoma, Rt leg DVT, HLD, HTN, IBS, Migraine headache, PNA, DM type 2  Constitutional:  BP 100/70 (BP Location: Right Arm, Patient Position: Sitting, Cuff Size: Normal)   Pulse 86   Temp 97.7 F (36.5 C) (Oral)   Ht 5\' 1"  (1.549 m)   Wt 167 lb 3.2 oz (75.8 kg)   SpO2 99%   BMI 31.59 kg/m   Brief Summary:  [name] is a 67 y.o. female smoker with COPD, obstructive sleep apnea, recurrent pneumonia and chronic respiratory failure.      Subjective:   She was back in hospital in February for pneumonia.  Chest xray from 09/30/22 showed improved in pneumonia.    She is scheduled for cystoscopy later this month with Dr. Bjorn Pippin for left renal stone.  She was started on cefuroxime earlier this month.  Has more cough, chest congestion and some wheeze.  Getting winded more easily.  Has trouble getting dizzy and then sometimes falls down.  Uses albuterol and this helps.       Physical Exam:   Appearance - wearing oxygen, sitting in a wheelchair  ENMT - no sinus tenderness, no oral exudate, no LAN, Mallampati 2 airway, no stridor, wears  dentures  Respiratory - decreased breath sounds bilaterally, no wheezing or rales  CV - s1s2 regular rate and rhythm, no murmurs  Ext - no clubbing, no edema  Skin - no rashes  Psych - normal mood and affect       Pulmonary testing:  Spirometry 10/27/13 >> FEV1 1.51 (66%), FEV1% 68 PFT 09/27/21 >> FEV1 1.05 (47%), FEV1% 58. TLC 3.51 (76%), DLCO 52% ABT 04/20/22 >> pH 7.32, PCO2 51.1, PO2 92  Chest Imaging:  HRCT chest 11/07/15 >> upper lobe scarring CT chest 05/11/21 >> centrilobular and paraseptal emphysema CT chest 05/12/22 >> moderate emphysema, apical scarring, patchy opacities CT angio chest 08/05/22 >> changes of emphysema, patchy subpleural ASD with some progression CT angio chest 09/18/22 >> alveolar consolidation LUL, small pneumonitis LLL  Sleep Tests:  ONO with CPAP and 2 liters 09/04/21 >> test time 6 hrs 33 min.  Baseline SpO2 94%, low SpO2 87%.  Spent 12 sec with SpO2 < 88%.  Cardiac Tests:  Echo 09/03/21 >> EF 60 to 65%, grade 1 DD Doppler Lt leg 12/26/21 >> DVT Lt common femoral vein  Social History:  She  reports that she has been smoking cigarettes. She started smoking about 47 years ago. She has a 20.00 pack-year smoking history. She has never used smokeless tobacco. She reports that she does not drink alcohol and does not use drugs.  Family History:  Her family history includes Alcohol abuse in her father; Bipolar disorder in her son; Breast cancer in her maternal aunt; Diabetes in her brother, maternal aunt, maternal grandfather, maternal uncle, and mother; Emphysema in  her father; Esophageal cancer in her maternal uncle; Heart disease in her maternal grandfather, sister, son, and another family member; Lung cancer in her father and mother; Muscular dystrophy in her son; Other in her sister; Seizures in her brother; Stroke in her mother.     Assessment/Plan:   COPD with chronic bronchitis and emphysema. - she has multiple Abx allergies - has acute  exacerbation; just on cefuroxime; will give course of prednisone - continue trelegy 100 one puff daily, singulair - prn albuterol, flutter valve, mucinex - she has a nebulizer  Allergic rhinitis. - continue singulair, astepro  Obstructive sleep apnea. -  she was intolerant of PAP therapy  Chronic respiratory failure with hypoxia/hypercapnia. - 3 liters oxygen 24/7 - gets supplies from [employer] - goal SpO2 > 90%  Tobacco abuse. - discussed importance of smoking cessation  Left renal stone. - she is scheduled for cystoscopy with Dr. Bjorn Pippin - advised that this might need to be reschedule if her breathing doesn't improve after course of prednisone   Time Spent Involved in Patient Care on Day of Examination:  37 minutes  Follow up:   Patient Instructions  Prednisone 10 mg pill >> 3 pills daily for 2 days, 2 pills daily for 2 days, 1 pill daily for 2 days  Follow up in 1 month  Medication List:   Allergies as of 12/22/2022       Reactions   Amoxicillin Anaphylaxis   Breakout, mouth swells   Azithromycin Swelling, Rash   "swelling all over, hives, thrush"   Cefdinir    Throat swelling after 3rd dose   Chantix [varenicline] Other (See Comments)   "bad dreams, difficulty breathing"   Doxycycline Hyclate Anaphylaxis, Rash   swelling   Levofloxacin Anaphylaxis   Pt can take moxifloxacin   Penicillins Anaphylaxis, Shortness Of Breath, Rash   "ALMOST DIED, ended up in hospital"  PCN reaction causing immediate rash, facial/tongue/throat swelling, SOB or lightheadedness with hypotension: YES PCN reaction causing severe rash involving mucus membranes or skin necrosis: NO PCN reaction that required hospitalization YES Has patient had a PCN reaction occurring within the last 10 years: NO   Sulfonamide Derivatives Shortness Of Breath, Swelling, Rash   "break out from head to toe"   Toradol [ketorolac Tromethamine] Other (See Comments)   Seizure   Clarithromycin Hives    swelling   Nortriptyline Swelling, Rash, Other (See Comments)   Per patient had "kidney problems" on this medication, could not use bathroom (urinary retention)   Paxlovid [nirmatrelvir-ritonavir] Swelling   Throat swelling   Codeine Nausea And Vomiting   Fluticasone-salmeterol Other (See Comments)   REACTION: ulcers in mouth   Triptans Rash   Mouth breaks out and start itching        Medication List        Accurate as of Dec 22, 2022 11:02 AM. If you have any questions, ask your nurse or doctor.          albuterol 108 (90 Base) MCG/ACT inhaler Commonly known as: ProAir HFA INHALE TWO PUFFS INTO THE LUNGS EVERY 6 HOURS AS NEEDED FOR SHORTNESS OF BREATH   albuterol (2.5 MG/3ML) 0.083% nebulizer solution Commonly known as: PROVENTIL USE 1 VIAL IN NEBULIZER EVERY 6 HOURS AS NEEDED FOR SHORTNESS OF BREATH   Allergy Relief 10 MG tablet Generic drug: loratadine TAKE ONE TABLET BY MOUTH DAILY   allopurinol 100 MG tablet Commonly known as: ZYLOPRIM Take 100 mg by mouth daily.   ALPRAZolam 0.25 MG  tablet Commonly known as: XANAX Take 0.25 mg by mouth 2 (two) times daily.   aspirin EC 81 MG tablet Take 81 mg by mouth daily. Swallow whole.   Azelastine HCl 0.15 % Soln Commonly known as: Astepro Place 1 spray into the nose 2 (two) times daily as needed (Allergies).   cephALEXin 500 MG capsule Commonly known as: KEFLEX Take 1 capsule (500 mg total) by mouth 4 (four) times daily.   cyclobenzaprine 10 MG tablet Commonly known as: FLEXERIL Take 10 mg by mouth at bedtime as needed for muscle spasms.   famotidine 40 MG tablet Commonly known as: PEPCID Take 40 mg by mouth at bedtime.   ferrous sulfate 324 (65 Fe) MG Tbec TAKE ONE TABLET BY MOUTH EVERY OTHER DAY   gabapentin 100 MG capsule Commonly known as: NEURONTIN Take 1 capsule (100 mg total) by mouth at bedtime. What changed: when to take this   guaiFENesin 600 MG 12 hr tablet Commonly known as: Mucinex Take  2 tablets (1,200 mg total) by mouth 2 (two) times daily as needed.   ibuprofen 600 MG tablet Commonly known as: ADVIL Take 600 mg by mouth every 8 (eight) hours as needed for mild pain.   isosorbide mononitrate 60 MG 24 hr tablet Commonly known as: IMDUR Take 60 mg by mouth 2 (two) times daily.   lamoTRIgine 200 MG tablet Commonly known as: LAMICTAL TAKE 1 AND 1/2 TABLETS BY MOUTH TWICE DAILY   montelukast 10 MG tablet Commonly known as: SINGULAIR TAKE ONE TABLET BY MOUTH AT BEDTIME   OXYGEN Inhale 3 L into the lungs continuous.   pantoprazole 40 MG tablet Commonly known as: PROTONIX Take 1 tablet (40 mg total) by mouth 2 (two) times daily. **PLEASE CALL OFFICE TO SCHEDULE FOLLOW UP   predniSONE 10 MG tablet Commonly known as: DELTASONE Take 3 tablets (30 mg total) by mouth daily with breakfast for 2 days, THEN 2 tablets (20 mg total) daily with breakfast for 2 days, THEN 1 tablet (10 mg total) daily with breakfast for 2 days. Start taking on: Dec 22, 2022 Started by: Coralyn Helling, MD   QUEtiapine 100 MG tablet Commonly known as: SEROQUEL TAKE ONE TABLET BY MOUTH AT BEDTIME   simvastatin 40 MG tablet Commonly known as: ZOCOR TAKE ONE TABLET BY MOUTH AT BEDTIME   tiZANidine 4 MG tablet Commonly known as: ZANAFLEX TAKE ONE TABLET BY MOUTH EVERY 6 HOURS AS NEEDED FOR MUSCLE SPASMS FOR UP TO 30 DOSES   topiramate 100 MG tablet Commonly known as: TOPAMAX Take 200 mg twice daily   traZODone 50 MG tablet Commonly known as: DESYREL Take 50 mg by mouth at bedtime.   Trelegy Ellipta 100-62.5-25 MCG/ACT Aepb Generic drug: Fluticasone-Umeclidin-Vilant INHALE 1 PUFF BY MOUTH INTO THE LUNGS DAILY IN THE AFTERNOON   Vitamin D3 50 MCG (2000 UT) capsule TAKE ONE CAPSULE BY MOUTH DAILY   Xarelto 20 MG Tabs tablet Generic drug: rivaroxaban Take 20 mg by mouth daily.        Signature:  Coralyn Helling, MD [facility] Pulmonary/Critical Care Pager - [phone] 12/22/2022,  11:02 AM

## 2022-12-22 NOTE — Progress Notes (Unsigned)
Cardiology Office Note:    Date:  12/23/2022  ID:  [name], DOB 04-Aug-1956, MRN [id number] PCP: Shelby Dubin, FNP  [facility] Providers Cardiologist:  Nanetta Batty, MD          Patient Profile:   (HFpEF) heart failure with preserved ejection fraction  TTE 12/31/20 [facility]): EF 60, Gr 1 DD, mild LAE, NL RVSF, PASP 29 TTE 09/03/2021: EF 60-65, no RWMA, GR 1 DD, GLS -26.9, normal RVSF, RAP 3 Carotid US 11/10/2013: Bilateral ICA 1-39 Myoview 11/15/2012: No ischemia, EF 78 Chronic Obstructive Pulmonary Disease Tobacco use Chronic hypoxic respiratory failure on O2 Aortic atherosclerosis Hyperlipidemia  Hx of L leg DVT in 12/2021 & 09/2022 (L common femoral vein) Hx of R leg DVT in 1982 Epilepsy       History of Present Illness:   [name] is a 67 y.o. female who returns for surgical clearance.  She was last seen by Dr. Allyson Sabal 08/21/2021.  She needs to undergo ureteroscopy and stent placement with Dr. Annabell Howells under general anesthesia 12/30/2022. She was just seen by Dr. Craige Cotta yesterday (12/22/22) with pulmonology for AECOPD. She is on Cefuroxime and Prednisone was added to her regimen. Of note, she was admitted to [facility] in 09/2022 with pneumonia. She was dx with a L leg DVT during that admission and placed on Xarelto.  She was seen by Dr. Lenell Antu with vascular surgery in March 2024.  It was recommended that she continue 3 to 6 months of anticoagulation before discontinuation.  She is here today with her husband.  Since starting on prednisone yesterday, she feels that her breathing is improved.  She has not had chest pain.  She is fairly sedentary and is usually in a wheelchair.  She is on chronic O2.  She has not had orthopnea, leg edema or syncope.  Review of Systems  Musculoskeletal:  Positive for falls.   see HPI    Studies Reviewed:    EKG: NSR, HR 90, normal axis, nonspecific ST-T wave changes, low voltage, QTc 415  Risk Assessment/Calculations:              Physical Exam:   VS:  BP (!) 100/40   Pulse 90   Ht 5\' 1"  (1.549 m)   Wt 167 lb (75.8 kg)   SpO2 91%   BMI 31.55 kg/m    Wt Readings from Last 3 Encounters:  12/23/22 167 lb (75.8 kg)  12/22/22 167 lb 3.2 oz (75.8 kg)  12/18/22 166 lb (75.3 kg)    Constitutional:      Appearance: Not in distress. Chronically ill-appearing.  Neck:     Vascular: JVD normal.  Pulmonary:     Breath sounds: Diffuse Rhonchi present.  Cardiovascular:     Normal rate. Regular rhythm.     Murmurs: There is no murmur.  Edema:    Peripheral edema absent.  Abdominal:     Palpations: Abdomen is soft.  Skin:    General: Skin is warm and dry.       ASSESSMENT AND PLAN:   Preoperative cardiovascular examination She has not had chest pain but has noted shortness of breath recently which is likely related to Chronic Obstructive Pulmonary Disease. However, she has not had a stress test in 10 years. She is sedentary and it is difficult to assess her functional status. She is certainly at risk for coronary artery disease given hx of aortic atherosclerosis, ongoing tobacco use, sedentary lifestyle. I have recommended proceeding  with a pharmacologic nuclear stress test for risk stratification. If this is low risk, she will not require further testing. I discussed this with Dr. Anne Fu (attending MD) who agreed.  Arrange Lexiscan Myoview  From a cardiac standpoint, she can hold ASA for her surgery. She notes that she does not take this.  Further recommendations to follow   (HFpEF) heart failure with preserved ejection fraction [facility]) She has a hx of acute CHF in the setting of pneumonia admission in the last few years. She does not currently take diuretics. Volume status appears stable on exam. She is mainly limited by shortness of breath related to Chronic Obstructive Pulmonary Disease.  History of DVT (deep vein thrombosis) She has had a L leg DVT in 2023 and again in 09/2022. Her anticoagulation is managed by  primary care and she has seen vascular surgery for evaluation in the past. Defer recommendations regarding anticoagulation around the time of surgery to primary care +/- vascular surgery.   Addendum (12/25/22): Myoview 12/25/22: EF 74, no ischemia or infarction, low risk. Stress test is low risk. Therefore, from a cardiac standpoint, the patient can proceed with her procedure at acceptable risk. No further cardiac testing needed.     Informed Consent   Shared Decision Making/Informed Consent The risks [chest pain, shortness of breath, cardiac arrhythmias, dizziness, blood pressure fluctuations, myocardial infarction, stroke/transient ischemic attack, nausea, vomiting, allergic reaction, radiation exposure, metallic taste sensation and life-threatening complications (estimated to be 1 in 10,000)], benefits (risk stratification, diagnosing coronary artery disease, treatment guidance) and alternatives of a nuclear stress test were discussed in detail with Ms. Taitano and she agrees to proceed.    Dispo:  Return in about 6 months (around 06/25/2023) for Routine Follow Up w/ Dr. Allyson Sabal.  Signed, Tereso Newcomer, PA-C

## 2022-12-22 NOTE — Telephone Encounter (Signed)
Called and spoke with patient. Patient stated she was currently being seen for her cough and shortness of breath.   Nothing further needed.

## 2022-12-22 NOTE — Patient Instructions (Signed)
Prednisone 10 mg pill >> 3 pills daily for 2 days, 2 pills daily for 2 days, 1 pill daily for 2 days  Follow up in 1 month

## 2022-12-22 NOTE — Progress Notes (Signed)
Sent message, via epic in basket, requesting orders in epic from surgeon.  

## 2022-12-23 ENCOUNTER — Encounter: Payer: Self-pay | Admitting: Physician Assistant

## 2022-12-23 ENCOUNTER — Ambulatory Visit: Payer: 59 | Attending: Physician Assistant | Admitting: Physician Assistant

## 2022-12-23 VITALS — BP 100/40 | HR 90 | Ht 61.0 in | Wt 167.0 lb

## 2022-12-23 DIAGNOSIS — Z01810 Encounter for preprocedural cardiovascular examination: Secondary | ICD-10-CM

## 2022-12-23 DIAGNOSIS — I5032 Chronic diastolic (congestive) heart failure: Secondary | ICD-10-CM | POA: Diagnosis not present

## 2022-12-23 DIAGNOSIS — R079 Chest pain, unspecified: Secondary | ICD-10-CM | POA: Diagnosis not present

## 2022-12-23 DIAGNOSIS — Z86718 Personal history of other venous thrombosis and embolism: Secondary | ICD-10-CM | POA: Diagnosis not present

## 2022-12-23 NOTE — Patient Instructions (Signed)
DUE TO COVID-19 ONLY TWO VISITORS  (aged 65 and older)  ARE ALLOWED TO COME WITH YOU AND STAY IN THE WAITING ROOM ONLY DURING PRE OP AND PROCEDURE.   **NO VISITORS ARE ALLOWED IN THE SHORT STAY AREA OR RECOVERY ROOM!!**  IF YOU WILL BE ADMITTED INTO THE HOSPITAL YOU ARE ALLOWED ONLY FOUR SUPPORT PEOPLE DURING VISITATION HOURS ONLY (7 AM -8PM)   The support person(s) must pass our screening, gel in and out, and wear a mask at all times, including in the patient's room. Patients must also wear a mask when staff or their support person are in the room. Visitors GUEST BADGE MUST BE WORN VISIBLY  One adult visitor may remain with you overnight and MUST be in the room by 8 P.M.     Your procedure is scheduled on: 12/30/22   Report to [facility] Main Entrance    Report to admitting at : 12:30 PM   Call this number if you have problems the morning of surgery [phone]   Do not eat food :After Midnight.   After Midnight you may have the following liquids until : 11:30 AM DAY OF SURGERY  Water Black Coffee (sugar ok, NO MILK/CREAM OR CREAMERS)  Tea (sugar ok, NO MILK/CREAM OR CREAMERS) regular and decaf                             Plain Jell-O (NO RED)                                           Fruit ices (not with fruit pulp, NO RED)                                     Popsicles (NO RED)                                                                  Juice: apple, WHITE grape, WHITE cranberry Sports drinks like Gatorade (NO RED)               Oral Hygiene is also important to reduce your risk of infection.                                    Remember - BRUSH YOUR TEETH THE MORNING OF SURGERY WITH YOUR REGULAR TOOTHPASTE  DENTURES WILL BE REMOVED PRIOR TO SURGERY PLEASE DO NOT APPLY "Poly grip" OR ADHESIVES!!!   Do NOT smoke after Midnight   Take these medicines the morning of surgery with A SIP OF WATER:  isosorbide,alprazolam,gabapentin,lamotrigine,topiramate,prednisone,allopurinol,pantoprazole.Use inhalers as usual.  DO NOT TAKE ANY ORAL DIABETIC MEDICATIONS DAY OF YOUR SURGERY  Bring CPAP mask and tubing day of surgery.                              You may not have any metal on your body including hair pins, jewelry, and body  piercing             Do not wear make-up, lotions, powders, perfumes/cologne, or deodorant  Do not wear nail polish including gel and S&S, artificial/acrylic nails, or any other type of covering on natural nails including finger and toenails. If you have artificial nails, gel coating, etc. that needs to be removed by a nail salon please have this removed prior to surgery or surgery may need to be canceled/ delayed if the surgeon/ anesthesia feels like they are unable to be safely monitored.   Do not shave  48 hours prior to surgery.    Do not bring valuables to the hospital.  IS NOT             RESPONSIBLE   FOR VALUABLES.   Contacts, glasses, or bridgework may not be worn into surgery.   Bring small overnight bag day of surgery.   DO NOT BRING YOUR HOME MEDICATIONS TO THE HOSPITAL. PHARMACY WILL DISPENSE MEDICATIONS LISTED ON YOUR MEDICATION LIST TO YOU DURING YOUR ADMISSION IN THE HOSPITAL!    Patients discharged on the day of surgery will not be allowed to drive home.  Someone NEEDS to stay with you for the first 24 hours after anesthesia.   Special Instructions: Bring a copy of your healthcare power of attorney and living will documents         the day of surgery if you haven't scanned them before.              Please read over the following fact sheets you were given: IF YOU HAVE QUESTIONS ABOUT YOUR PRE-OP INSTRUCTIONS PLEASE CALL [phone]    [facility] - Preparing for Surgery Before surgery, you can play an important role.  Because skin is not sterile, your skin needs to be as free of germs as possible.  You can reduce the number of germs  on your skin by washing with CHG (chlorahexidine gluconate) soap before surgery.  CHG is an antiseptic cleaner which kills germs and bonds with the skin to continue killing germs even after washing. Please DO NOT use if you have an allergy to CHG or antibacterial soaps.  If your skin becomes reddened/irritated stop using the CHG and inform your nurse when you arrive at Short Stay. Do not shave (including legs and underarms) for at least 48 hours prior to the first CHG shower.  You may shave your face/neck. Please follow these instructions carefully:  1.  Shower with CHG Soap the night before surgery and the  morning of Surgery.  2.  If you choose to wash your hair, wash your hair first as usual with your  normal  shampoo.  3.  After you shampoo, rinse your hair and body thoroughly to remove the  shampoo.                           4.  Use CHG as you would any other liquid soap.  You can apply chg directly  to the skin and wash                       Gently with a scrungie or clean washcloth.  5.  Apply the CHG Soap to your body ONLY FROM THE NECK DOWN.   Do not use on face/ open  Wound or open sores. Avoid contact with eyes, ears mouth and genitals (private parts).                       Wash face,  Genitals (private parts) with your normal soap.             6.  Wash thoroughly, paying special attention to the area where your surgery  will be performed.  7.  Thoroughly rinse your body with warm water from the neck down.  8.  DO NOT shower/wash with your normal soap after using and rinsing off  the CHG Soap.                9.  Pat yourself dry with a clean towel.            10.  Wear clean pajamas.            11.  Place clean sheets on your bed the night of your first shower and do not  sleep with pets. Day of Surgery : Do not apply any lotions/deodorants the morning of surgery.  Please wear clean clothes to the hospital/surgery center.  FAILURE TO FOLLOW THESE INSTRUCTIONS  MAY RESULT IN THE CANCELLATION OF YOUR SURGERY PATIENT SIGNATURE_________________________________  NURSE SIGNATURE__________________________________  ________________________________________________________________________

## 2022-12-23 NOTE — Assessment & Plan Note (Signed)
She has a hx of acute CHF in the setting of pneumonia admission in the last few years. She does not currently take diuretics. Volume status appears stable on exam. She is mainly limited by shortness of breath related to Chronic Obstructive Pulmonary Disease.

## 2022-12-23 NOTE — Patient Instructions (Signed)
Medication Instructions:  Your physician recommends that you continue on your current medications as directed. Please refer to the Current Medication list given to you today.  *If you need a refill on your cardiac medications before your next appointment, please call your pharmacy*   Lab Work: None ordered  If you have labs (blood work) drawn today and your tests are completely normal, you will receive your results only by: MyChart Message (if you have MyChart) OR A paper copy in the mail If you have any lab test that is abnormal or we need to change your treatment, we will call you to review the results.   Testing/Procedures: Your physician has requested that you have a lexiscan myoview. For further information please visit [email or web address] Please follow instruction sheet, BELOW:    You are scheduled for a Myocardial Perfusion Imaging Study Please arrive 15 minutes prior to your appointment time for registration and insurance purposes.  The test will take approximately 3 to 4 hours to complete; you may bring reading material.  If someone comes with you to your appointment, they will need to remain in the main lobby due to limited space in the testing area. **If you are pregnant or breastfeeding, please notify the nuclear lab prior to your appointment**  How to prepare for your Myocardial Perfusion Test: Do not eat or drink 3 hours prior to your test, except you may have water. Do not consume products containing caffeine (regular or decaffeinated) 12 hours prior to your test. (ex: coffee, chocolate, sodas, tea). Do bring a list of your current medications with you.  If not listed below, you may take your medications as normal. Do wear comfortable clothes (no dresses or overalls) and walking shoes, tennis shoes preferred (No heels or open toe shoes are allowed). Do NOT wear cologne, perfume, aftershave, or lotions (deodorant is allowed). If these instructions are not followed, your  test will have to be rescheduled.     Follow-Up: At [facility], you and your health needs are our priority.  As part of our continuing mission to provide you with exceptional heart care, we have created designated Provider Care Teams.  These Care Teams include your primary Cardiologist (physician) and Advanced Practice Providers (APPs -  Physician Assistants and Nurse Practitioners) who all work together to provide you with the care you need, when you need it.  We recommend signing up for the patient portal called "MyChart".  Sign up information is provided on this After Visit Summary.  MyChart is used to connect with patients for Virtual Visits (Telemedicine).  Patients are able to view lab/test results, encounter notes, upcoming appointments, etc.  Non-urgent messages can be sent to your provider as well.   To learn more about what you can do with MyChart, go to [email or web address].    Your next appointment:   6 month(s)  Provider:   Nanetta Batty, MD     Other Instructions

## 2022-12-23 NOTE — Assessment & Plan Note (Addendum)
She has not had chest pain but has noted shortness of breath recently which is likely related to Chronic Obstructive Pulmonary Disease. However, she has not had a stress test in 10 years. She is sedentary and it is difficult to assess her functional status. She is certainly at risk for coronary artery disease given hx of aortic atherosclerosis, ongoing tobacco use, sedentary lifestyle. I have recommended proceeding with a pharmacologic nuclear stress test for risk stratification. If this is low risk, she will not require further testing. I discussed this with Dr. Anne Fu (attending MD) who agreed.  Arrange Lexiscan Myoview  From a cardiac standpoint, she can hold ASA for her surgery. She notes that she does not take this.  Further recommendations to follow

## 2022-12-23 NOTE — Assessment & Plan Note (Signed)
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 Her anticoagulation is managed by primary care and she has seen vascular surgery for evaluation in the past. Defer recommendations regarding anticoagulation around the time of surgery to primary care +/- vascular surgery.

## 2022-12-24 ENCOUNTER — Encounter (HOSPITAL_COMMUNITY): Payer: Self-pay

## 2022-12-24 ENCOUNTER — Encounter (HOSPITAL_COMMUNITY)
Admission: RE | Admit: 2022-12-24 | Discharge: 2022-12-24 | Disposition: A | Discharge status: Home / Self Care | Payer: 59 | Source: Ambulatory Visit | Attending: Urology | Admitting: Urology

## 2022-12-24 ENCOUNTER — Other Ambulatory Visit: Payer: Self-pay

## 2022-12-24 ENCOUNTER — Telehealth (HOSPITAL_COMMUNITY): Payer: Self-pay | Admitting: *Deleted

## 2022-12-24 ENCOUNTER — Other Ambulatory Visit: Payer: Self-pay | Admitting: Physician Assistant

## 2022-12-24 VITALS — BP 87/73 | HR 94 | Temp 98.0°F | Ht 61.0 in | Wt 167.0 lb

## 2022-12-24 DIAGNOSIS — R7303 Prediabetes: Secondary | ICD-10-CM | POA: Diagnosis not present

## 2022-12-24 DIAGNOSIS — Z01812 Encounter for preprocedural laboratory examination: Secondary | ICD-10-CM | POA: Diagnosis present

## 2022-12-24 DIAGNOSIS — Z01810 Encounter for preprocedural cardiovascular examination: Secondary | ICD-10-CM

## 2022-12-24 DIAGNOSIS — R0602 Shortness of breath: Secondary | ICD-10-CM

## 2022-12-24 HISTORY — DX: Heart failure, unspecified: I50.9

## 2022-12-24 HISTORY — DX: Chronic kidney disease, unspecified: N18.9

## 2022-12-24 LAB — HEMOGLOBIN A1C
Hgb A1c MFr Bld: 5.7 % — ABNORMAL HIGH (ref 4.8–5.6)
Mean Plasma Glucose: 116.89 mg/dL

## 2022-12-24 LAB — GLUCOSE, CAPILLARY: Glucose-Capillary: 132 mg/dL — ABNORMAL HIGH (ref 70–99)

## 2022-12-24 NOTE — Progress Notes (Signed)
For Short Stay: COVID SWAB appointment date:  Bowel Prep reminder: N/A   For Anesthesia: PCP - Alba Cory Bucio: FNP Cardiologist - DR. Nanetta Batty Clearance: Tereso Newcomer: PAC: 12/23/22 Chest x-ray - 09/30/22 EKG - 08/06/22 Stress Test - pending on 12/25/22 ECHO - 09/03/21 Cardiac Cath -  Pacemaker/ICD device last checked: Pacemaker orders received: Device Rep notified:  Spinal Cord Stimulator: N/A  Sleep Study - Yes CPAP - No  Fasting Blood Sugar - N/A Checks Blood Sugar ___0__ times a day Date and result of last Hgb A1c-  Last dose of GLP1 agonist- N/A GLP1 instructions:   Last dose of SGLT-2 inhibitors- N/A SGLT-2 instructions:   Blood Thinner Instructions: Xarelto will be held 2 days before surgery as per pt. Aspirin Instructions: On hold. Last Dose:  Activity level: Can go up a flight of stairs and activities of daily living without stopping and without chest pain and/or shortness of breath     Unable to go up a flight of stairs without chest pain and/or shortness of breath. Very limited mobility,generalized weakness,Oxigen dependent.     Anesthesia review: HF,DIA,CKD III,DVT,Smoker,COPD(Continuous O2 @ 3L),Epilepsy(last episode 6 months ago)  Patient denies shortness of breath, fever, cough and chest pain at PAT appointment   Patient verbalized understanding of instructions that were given to them at the PAT appointment. Patient was also instructed that they will need to review over the PAT instructions again at home before surgery.

## 2022-12-24 NOTE — Telephone Encounter (Signed)
Pt reached and given instructions for MPI

## 2022-12-25 ENCOUNTER — Encounter: Payer: Self-pay | Admitting: Physician Assistant

## 2022-12-25 ENCOUNTER — Other Ambulatory Visit: Payer: Self-pay | Admitting: Urology

## 2022-12-25 ENCOUNTER — Ambulatory Visit (HOSPITAL_COMMUNITY): Payer: 59 | Attending: Cardiovascular Disease

## 2022-12-25 DIAGNOSIS — Z01810 Encounter for preprocedural cardiovascular examination: Secondary | ICD-10-CM | POA: Insufficient documentation

## 2022-12-25 DIAGNOSIS — R079 Chest pain, unspecified: Secondary | ICD-10-CM | POA: Diagnosis not present

## 2022-12-25 DIAGNOSIS — Z9289 Personal history of other medical treatment: Secondary | ICD-10-CM | POA: Insufficient documentation

## 2022-12-25 DIAGNOSIS — N200 Calculus of kidney: Secondary | ICD-10-CM

## 2022-12-25 LAB — MYOCARDIAL PERFUSION IMAGING
LV dias vol: 60 mL (ref 46–106)
LV sys vol: 16 mL
Nuc Stress EF: 74 %
Peak HR: 107 {beats}/min
Rest HR: 88 {beats}/min
Rest Nuclear Isotope Dose: 11 mCi
SDS: 4
SRS: 0
SSS: 4
ST Depression (mm): 0 mm
Stress Nuclear Isotope Dose: 32.2 mCi
TID: 1.05

## 2022-12-25 MED ORDER — TECHNETIUM TC 99M TETROFOSMIN IV KIT
11.0000 | PACK | Freq: Once | INTRAVENOUS | Status: AC | PRN
  Administered 2022-12-25: 11 via INTRAVENOUS

## 2022-12-25 MED ORDER — REGADENOSON 0.4 MG/5ML IV SOLN
0.4000 mg | Freq: Once | INTRAVENOUS | Status: AC
Start: 2022-12-25 — End: 2022-12-25
  Administered 2022-12-25: 0.4 mg via INTRAVENOUS

## 2022-12-25 MED ORDER — TECHNETIUM TC 99M TETROFOSMIN IV KIT
32.2000 | PACK | Freq: Once | INTRAVENOUS | Status: AC | PRN
  Administered 2022-12-25: 32.2 via INTRAVENOUS

## 2022-12-25 NOTE — Telephone Encounter (Signed)
Note faxed to surgeon Tereso Newcomer, PA-C    12/25/2022 5:31 PM

## 2022-12-29 ENCOUNTER — Other Ambulatory Visit: Payer: Self-pay | Admitting: Urology

## 2022-12-29 DIAGNOSIS — N200 Calculus of kidney: Secondary | ICD-10-CM

## 2022-12-29 MED ORDER — OXYCODONE-ACETAMINOPHEN 5-325 MG PO TABS: 1.0000 | ORAL_TABLET | Freq: Four times a day (QID) | ORAL | 0 refills | Status: DC | PRN

## 2023-01-02 ENCOUNTER — Ambulatory Visit: Payer: 59 | Admitting: Adult Health

## 2023-01-07 ENCOUNTER — Encounter (HOSPITAL_COMMUNITY): Payer: Self-pay

## 2023-01-13 ENCOUNTER — Ambulatory Visit (INDEPENDENT_AMBULATORY_CARE_PROVIDER_SITE_OTHER): Payer: 59 | Admitting: Adult Health

## 2023-01-13 ENCOUNTER — Encounter: Payer: Self-pay | Admitting: Adult Health

## 2023-01-13 VITALS — BP 90/60 | HR 91 | Temp 98.1°F | Ht 61.0 in | Wt 162.0 lb

## 2023-01-13 DIAGNOSIS — J449 Chronic obstructive pulmonary disease, unspecified: Secondary | ICD-10-CM

## 2023-01-13 DIAGNOSIS — Z01811 Encounter for preprocedural respiratory examination: Secondary | ICD-10-CM | POA: Diagnosis not present

## 2023-01-13 DIAGNOSIS — J9611 Chronic respiratory failure with hypoxia: Secondary | ICD-10-CM | POA: Diagnosis not present

## 2023-01-13 MED ORDER — LORATADINE 10 MG PO TABS: 10.0000 mg | ORAL_TABLET | Freq: Every day | ORAL | 3 refills | Status: AC

## 2023-01-13 NOTE — Patient Instructions (Addendum)
Continue on Trelegy 1 puff daily  Albuterol inhaler /neb As needed   PT as planned  Continue on Oxygen 3l/m  Continue on Singulair and Claritin daily  Good luck with upcoming urology procedure.  Follow up with Dr. Craige Cotta next month as planned

## 2023-01-13 NOTE — Progress Notes (Unsigned)
@Patient  ID: [name], female    DOB: [birth year], 67 y.o.   MRN: [id number]  Chief Complaint  Patient presents with   Follow-up    Referring provider: Shelby Dubin, FNP  HPI: 67 year old female active smoker followed for COPD, obstructive sleep apnea, recurrent pneumonia and chronic respiratory failure  TEST/EVENTS :  Spirometry 10/27/13 >> FEV1 1.51 (66%), FEV1% 68 PFT 09/27/21 >> FEV1 1.05 (47%), FEV1% 58. TLC 3.51 (76%), DLCO 52% ABT 04/20/22 >> pH 7.32, PCO2 51.1, PO2 92   Chest Imaging:  HRCT chest 11/07/15 >> upper lobe scarring CT chest 05/11/21 >> centrilobular and paraseptal emphysema CT chest 05/12/22 >> moderate emphysema, apical scarring, patchy opacities CT angio chest 08/05/22 >> changes of emphysema, patchy subpleural ASD with some progression CT angio chest 09/18/22 >> alveolar consolidation LUL, small pneumonitis LLL   Sleep Tests:  ONO with CPAP and 2 liters 09/04/21 >> test time 6 hrs 33 min.  Baseline SpO2 94%, low SpO2 87%.  Spent 12 sec with SpO2 < 88%.   Cardiac Tests:  Echo 09/03/21 >> EF 60 to 65%, grade 1 DD Doppler Lt leg 12/26/21 >> DVT Lt common femoral vein  01/13/2023 follow-up: COPD , O2 RF  Patient returns for 2-week follow-up.  Patient was seen last visit for COPD exacerbation.  She was treated with antibiotics and short course of prednisone.  Patient says she is feeling better.  Cough and congestion have improved.  She remains on Trelegy inhaler daily.  She is currently taking physical therapy which she feels is helping some.  She remains on Singulair and Claritin for chronic allergies.  She does have an upcoming urological procedure due to kidney stone. She does continue to smoke.  We discussed smoking cessation.   Allergies  Allergen Reactions   Amoxicillin Anaphylaxis    Breakout, mouth swells   Azithromycin Swelling and Rash    "swelling all over, hives, thrush"   Cefdinir     Throat swelling after 3rd dose   Chantix [Varenicline] Other  (See Comments)    "bad dreams, difficulty breathing"   Doxycycline Hyclate Anaphylaxis and Rash    swelling   Levofloxacin Anaphylaxis    Pt can take moxifloxacin   Penicillins Anaphylaxis, Shortness Of Breath and Rash    "ALMOST DIED, ended up in hospital"  PCN reaction causing immediate rash, facial/tongue/throat swelling, SOB or lightheadedness with hypotension: YES PCN reaction causing severe rash involving mucus membranes or skin necrosis: NO PCN reaction that required hospitalization YES Has patient had a PCN reaction occurring within the last 10 years: NO    Sulfonamide Derivatives Shortness Of Breath, Swelling and Rash    "break out from head to toe"   Toradol [Ketorolac Tromethamine] Other (See Comments)    Seizure    Clarithromycin Hives    swelling   Nortriptyline Swelling, Rash and Other (See Comments)    Per patient had "kidney problems" on this medication, could not use bathroom (urinary retention)   Paxlovid [Nirmatrelvir-Ritonavir] Swelling    Throat swelling   Codeine Nausea And Vomiting   Fluticasone-Salmeterol Other (See Comments)    REACTION: ulcers in mouth   Triptans Rash    Mouth breaks out and start itching    Immunization History  Administered Date(s) Administered   Influenza Split 05/22/2011, 05/05/2012   Influenza Whole 05/27/2007, 05/01/2008, 05/22/2009, 05/23/2010   Influenza,inj,Quad PF,6+ Mos 05/10/2013, 04/19/2014, 06/25/2015, 04/15/2016, 05/12/2017, 05/11/2019, 05/16/2020, 05/15/2021   PNEUMOCOCCAL CONJUGATE-20 07/02/2021   Pneumococcal Polysaccharide-23 08/12/1999, 04/19/2014, 06/11/2021  Td 12/09/2001   Tdap 01/05/2013    Past Medical History:  Diagnosis Date   Allergic rhinitis    Arthritis    "hands and legs and feet" (04/27/2012)   Asthma    Cancer (HCC) 2005   rt arm mole rem cancer   CHF (congestive heart failure) (HCC)    Chronic bronchitis (HCC)    "keep it all the time" (04/27/2012)   Chronic kidney disease    Chronic  kidney disease (CKD)    Stage III   COPD (chronic obstructive pulmonary disease) (HCC)    COVID-19 virus infection    03-2021   Depression    PT DENIES   Diverticulosis    DJD (degenerative joint disease)    Emphysema    Epilepsy (HCC)    Epilepsy (HCC)    Esophageal stricture    Exertional dyspnea    GERD (gastroesophageal reflux disease)    Glaucoma    H/O blood clots    Right leg   Heart failure (HCC)    History of COVID-19 03/2021   History of nuclear stress test    Myoview 12/25/22: EF 74, no ischemia or infarction, low risk   Hyperlipidemia    Hypertension    IBS (irritable bowel syndrome)    Migraine    Neck pain    Cervical disc   OSA on CPAP    6-7 yrs; pt wears CPAP but has not been able to use it lately due to sinus issues   Oxygen deficiency    USES 3 LITERS 02 CONTINOUS   Pneumonia 2012; 04/27/2012   Recurrent upper respiratory infection (URI)    Right leg DVT (HCC) 1982   groin   Seizures (HCC)    LAST SEIZURE 6 MONTHS OR  LONGER   Sleep apnea    Type II diabetes mellitus (HCC)    No meds since weight loss   UTI (urinary tract infection)    2 weeks ago   Vertigo 10/24/2014    Tobacco History: Social History   Tobacco Use  Smoking Status Every Day   Packs/day: 2.00   Years: 40.00   Additional pack years: 0.00   Total pack years: 80.00   Types: Cigarettes   Start date: 08/12/1975   Last attempt to quit: 12/30/2020   Years since quitting: 2.0  Smokeless Tobacco Never  Tobacco Comments   Smoking 6 per day.  Trying to quit.  01/13/2023   Ready to quit: Not Answered Counseling given: Not Answered Tobacco comments: Smoking 6 per day.  Trying to quit.  01/13/2023   Outpatient Medications Prior to Visit  Medication Sig Dispense Refill   albuterol (PROAIR HFA) 108 (90 Base) MCG/ACT inhaler INHALE TWO PUFFS INTO THE LUNGS EVERY 6 HOURS AS NEEDED FOR SHORTNESS OF BREATH 18 g 6   albuterol (PROVENTIL) (2.5 MG/3ML) 0.083% nebulizer solution USE 1 VIAL IN  NEBULIZER EVERY 6 HOURS AS NEEDED FOR SHORTNESS OF BREATH 75 mL 8   ALLERGY RELIEF 10 MG tablet TAKE ONE TABLET BY MOUTH DAILY 90 tablet 1   allopurinol (ZYLOPRIM) 100 MG tablet Take 100 mg by mouth daily.     ALPRAZolam (XANAX) 0.25 MG tablet Take 0.25 mg by mouth 2 (two) times daily.     aspirin EC 81 MG tablet Take 81 mg by mouth daily. Swallow whole.     Azelastine HCl (ASTEPRO) 0.15 % SOLN Place 1 spray into the nose 2 (two) times daily as needed (Allergies). 30 mL 5  cephALEXin (KEFLEX) 500 MG capsule Take 1 capsule (500 mg total) by mouth 4 (four) times daily. 40 capsule 0   Cholecalciferol (VITAMIN D3) 50 MCG (2000 UT) capsule TAKE ONE CAPSULE BY MOUTH DAILY 90 capsule 1   famotidine (PEPCID) 40 MG tablet Take 40 mg by mouth at bedtime.     ferrous sulfate 324 (65 Fe) MG TBEC TAKE ONE TABLET BY MOUTH EVERY OTHER DAY 90 tablet 5   gabapentin (NEURONTIN) 100 MG capsule Take 1 capsule (100 mg total) by mouth at bedtime. (Patient taking differently: Take 100 mg by mouth 3 (three) times daily.) 90 capsule 3   guaiFENesin (MUCINEX) 600 MG 12 hr tablet Take 2 tablets (1,200 mg total) by mouth 2 (two) times daily as needed. 60 tablet 5   lamoTRIgine (LAMICTAL) 200 MG tablet TAKE 1 AND 1/2 TABLETS BY MOUTH TWICE DAILY 270 tablet 1   montelukast (SINGULAIR) 10 MG tablet TAKE ONE TABLET BY MOUTH AT BEDTIME 30 tablet 5   oxyCODONE-acetaminophen (PERCOCET/ROXICET) 5-325 MG tablet Take 1 tablet by mouth every 6 (six) hours as needed for severe pain. 15 tablet 0   OXYGEN Inhale 3 L into the lungs continuous.     pantoprazole (PROTONIX) 40 MG tablet Take 1 tablet (40 mg total) by mouth 2 (two) times daily. **PLEASE CALL OFFICE TO SCHEDULE FOLLOW UP 60 tablet 0   QUEtiapine (SEROQUEL) 100 MG tablet TAKE ONE TABLET BY MOUTH AT BEDTIME 30 tablet 5   simvastatin (ZOCOR) 40 MG tablet TAKE ONE TABLET BY MOUTH AT BEDTIME 90 tablet 2   tiZANidine (ZANAFLEX) 4 MG tablet TAKE ONE TABLET BY MOUTH EVERY 6 HOURS AS  NEEDED FOR MUSCLE SPASMS FOR UP TO 30 DOSES 15 tablet 1   topiramate (TOPAMAX) 100 MG tablet Take 200 mg twice daily 360 tablet 2   TRELEGY ELLIPTA 100-62.5-25 MCG/ACT AEPB INHALE 1 PUFF BY MOUTH INTO THE LUNGS DAILY IN THE AFTERNOON 60 each 5   triamcinolone cream (KENALOG) 0.1 % Apply 1 Application topically daily as needed (irritation).     XARELTO 20 MG TABS tablet Take 20 mg by mouth daily.     isosorbide mononitrate (IMDUR) 60 MG 24 hr tablet Take 60 mg by mouth daily. (Patient not taking: Reported on 01/13/2023)     No facility-administered medications prior to visit.     Review of Systems:   Constitutional:   No  weight loss, night sweats,  Fevers, chills,  +fatigue, or  lassitude.  HEENT:   No headaches,  Difficulty swallowing,  Tooth/dental problems, or  Sore throat,                No sneezing, itching, ear ache, nasal congestion, post nasal drip,   CV:  No chest pain,  Orthopnea, PND, swelling in lower extremities, anasarca, dizziness, palpitations, syncope.   GI  No heartburn, indigestion, abdominal pain, nausea, vomiting, diarrhea, change in bowel habits, loss of appetite, bloody stools.   Resp:   No chest wall deformity  Skin: no rash or lesions.  GU: no dysuria, change in color of urine, no urgency or frequency.  No flank pain, no hematuria   MS:  No joint pain or swelling.  No decreased range of motion.  No back pain.    Physical Exam  BP 90/60 (BP Location: Right Arm, Patient Position: Sitting, Cuff Size: Large)   Pulse 91   Temp 98.1 F (36.7 C) (Oral)   Ht 5\' 1"  (1.549 m)   Wt 162 lb (73.5  kg)   SpO2 100%   BMI 30.61 kg/m   GEN: A/Ox3; pleasant , NAD, well nourished    HEENT:  [place]/AT,  NOSE-clear, THROAT-clear, no lesions, no postnasal drip or exudate noted.   NECK:  Supple w/ fair ROM; no JVD; normal carotid impulses w/o bruits; no thyromegaly or nodules palpated; no lymphadenopathy.    RESP  Clear  P & A; w/o, wheezes/ rales/ or rhonchi. no  accessory muscle use, no dullness to percussion  CARD:  RRR, no m/r/g, no peripheral edema, pulses intact, no cyanosis or clubbing.  GI:   Soft & nt; nml bowel sounds; no organomegaly or masses detected.   Musco: Warm bil, no deformities or joint swelling noted.   Neuro: alert, no focal deficits noted.    Skin: Warm, no lesions or rashes    Lab Results:  CBC    Component Value Date/Time   WBC 14.0 (H) 12/15/2022 1720   RBC 3.67 (L) 12/15/2022 1720   HGB 11.3 (L) 12/15/2022 1720   HGB 10.1 (L) 05/24/2021 0950   HCT 36.6 12/15/2022 1720   HCT 30.9 (L) 05/24/2021 0950   PLT 222 12/15/2022 1720   PLT 297 05/24/2021 0950   MCV 99.7 12/15/2022 1720   MCV 93 05/24/2021 0950   MCH 30.8 12/15/2022 1720   MCHC 30.9 12/15/2022 1720   RDW 15.0 12/15/2022 1720   RDW 14.4 05/24/2021 0950   LYMPHSABS 1.9 12/15/2022 1720   LYMPHSABS 1.7 05/24/2021 0950   MONOABS 1.3 (H) 12/15/2022 1720   EOSABS 0.2 12/15/2022 1720   EOSABS 0.2 05/24/2021 0950   BASOSABS 0.0 12/15/2022 1720   BASOSABS 0.0 05/24/2021 0950    BMET    Component Value Date/Time   NA 137 12/15/2022 1720   NA 142 05/24/2021 0950   K 3.9 12/15/2022 1720   CL 104 12/15/2022 1720   CO2 23 12/15/2022 1720   GLUCOSE 99 12/15/2022 1720   BUN 23 12/15/2022 1720   BUN 13 05/24/2021 0950   CREATININE 0.96 12/15/2022 1720   CREATININE 0.91 06/13/2016 1441   CALCIUM 8.9 12/15/2022 1720   GFRNONAA >60 12/15/2022 1720   GFRNONAA 70 06/13/2016 1441   GFRAA 68 04/10/2020 1418   GFRAA 80 06/13/2016 1441    BNP    Component Value Date/Time   BNP 14.4 01/30/2012 1700    ProBNP    Component Value Date/Time   PROBNP 16.0 11/18/2021 0938    Imaging: MYOCARDIAL PERFUSION IMAGING  Result Date: 12/25/2022   The study is normal. The study is low risk.   No ST deviation was noted.   LV perfusion is normal. There is no evidence of ischemia. There is no evidence of infarction.   Left ventricular function is normal. Nuclear  stress EF: 74 %. The left ventricular ejection fraction is hyperdynamic (>65%). End diastolic cavity size is normal. End systolic cavity size is normal.   Prior study not available for comparison. Normal resting and stress perfusion. No ischemia or infarction EF 74%   DG OP Swallowing Func-Medicare/Speech Path  Result Date: 12/17/2022 Table formatting from the original result was not included. Modified Barium Swallow Study Patient Details Name: EUSTACIA KUBAS MRN: [id number] Date of Birth: 11/21/1955 Today's Date: 12/17/2022 HPI/PMH: HPI: Pt is a 67 yo female referred for OP MBS by GI due to reports of worsening symptoms of dysphagia; Pt has lost ~11lbs in the past month and reports 5 episodes of PNAin the last year. Most recent episode was in January  of 2024. Pt describes trouble with solids and liquids and the sensation of solids getting "stuck" and getting "strangled" on liquids. Pt had MBS in July 2022 with almost identical results to her previous MBSS in April 2021 reporting mistiming resulted in penetration of thin and nectar thick liquids that was not fully cleared despite strategies (although a breath hold was most successful). PMH includes: GERD, PNA, OSA, COPD, CHF, hiatal hernia, DM. Pt is chronically on O2. Clinical Impression: Pt presents with mild pharyngeal dysphagia characterized by TRACE penetration of thin and nectar liquids to the level of the vocal folds. Slightly worse presentation today from last MBSS July of 2022 as most penetration was cleared reflexively at that time, whereas today most/all presentations result in trace amouts of penetration during/after the swallow which fall to the true vocal cords. Also note diminished pharyngeal stripping wave resulting in increased pharyngeal residue visualized on the study today. Penetrates are sometimes sensed and reflexively cleared and most of the time NOT sensed and remain there until a cued cough or cued repeat swallow. Attempted multiple strategies,  none were effective to improve laryngeal vestibule closure. Honey presentation was not penetrated, however, question if trace amounts of residue once mixed with secretions would also be penetrated with additional sips. Esophageal sweep revealed brief standing column of barium containing the barium tablet but tablet did clear the esophagus requiring additional time and puree bolus to do so, radiologist present to confirm. No aspiration was visualized however cannot ensure that trace amounts on the true vocal cords are not occasionally aspirated. Further note, Pt has many complicating factors including COPD, compromised respiratory status and reflux increasing her risk for aspiration. Pt reports difficulty with meats and breads; SLP provided recommendation to continue with thin  liquids with intermittent throat clear and continue with regular diet with limitations (NO BREAD, mech soft meats); Reviewed the importance of oral hygene. Also recommed HH ST to completed dysphagia assessment in the home environment and provide further recommendations and strengthening dysphagia therapy. Study and recommendations reviewed at length with Pt and Pt's husband. Thank you for this referral, Factors that may increase risk of adverse event in presence of aspiration [employer] [place]ance Coots 2021): Factors that may increase risk of adverse event in presence of aspiration Rubye [place] & Clearance Coots 2021): Poor general health and/or compromised immunity; Limited mobility; Frail or deconditioned Recommendations/Plan: Swallowing Evaluation Recommendations Swallowing Evaluation Recommendations Recommendations: PO diet PO Diet Recommendation: Dysphagia 3 (Mechanical soft); Regular; Thin liquids (Level 0) Liquid Administration via: Cup; Straw Medication Administration: Whole meds with puree Supervision: Patient able to self-feed Swallowing strategies  : Slow rate; Small bites/sips; Clear throat intermittently Postural changes: Position pt fully upright for  meals Oral care recommendations: Oral care BID (2x/day) Treatment Plan Treatment Plan Treatment recommendations: Defer treatment plan to SLP at other venue (see follow-up recommendations) Follow-up recommendations: Home health SLP Recommendations Recommendations for follow up therapy are one component of a multi-disciplinary discharge planning process, led by the attending physician.  Recommendations may be updated based on patient status, additional functional criteria and insurance authorization. Assessment: Orofacial Exam: No data recorded Anatomy: Anatomy: WFL Boluses Administered: Boluses Administered Boluses Administered: Thin liquids (Level 0); Mildly thick liquids (Level 2, nectar thick); Moderately thick liquids (Level 3, honey thick); Puree; Solid  Oral Impairment Domain: Oral Impairment Domain Lip Closure: No labial escape Tongue control during bolus hold: Cohesive bolus between tongue to palatal seal Bolus preparation/mastication: Timely and efficient chewing and mashing Bolus transport/lingual motion: Brisk tongue motion Oral residue:  Trace residue lining oral structures Location of oral residue : Tongue Initiation of pharyngeal swallow : Pyriform sinuses  Pharyngeal Impairment Domain: Pharyngeal Impairment Domain Soft palate elevation: Trace column of contrast or air between SP and PW Laryngeal elevation: Partial superior movement of thyroid cartilage/partial approximation of arytenoids to epiglottic petiole Anterior hyoid excursion: Partial anterior movement Epiglottic movement: Partial inversion Laryngeal vestibule closure: Incomplete, narrow column air/contrast in laryngeal vestibule Pharyngeal stripping wave : Present - diminished Pharyngoesophageal segment opening: Complete distension and complete duration, no obstruction of flow Tongue base retraction: Trace column of contrast or air between tongue base and PPW Pharyngeal residue: Trace residue within or on pharyngeal structures Location of  pharyngeal residue: Tongue base; Valleculae; Pharyngeal wall; Diffuse (>3 areas)  Esophageal Impairment Domain: No data recorded Pill: No data recorded Penetration/Aspiration Scale Score: Penetration/Aspiration Scale Score 3.  Material enters airway, remains ABOVE vocal cords and not ejected out: Thin liquids (Level 0) 4.  Material enters airway, CONTACTS cords then ejected out: Thin liquids (Level 0) 5.  Material enters airway, CONTACTS cords and not ejected out: Thin liquids (Level 0) 6.  Material enters airway, passes BELOW cords then ejected out: Thin liquids (Level 0) Compensatory Strategies: Compensatory Strategies Compensatory strategies: Yes Effortful swallow: Ineffective Ineffective Effortful Swallow: Thin liquid (Level 0) Chin tuck: Ineffective Ineffective Chin Tuck: Thin liquid (Level 0) Left head turn: Ineffective Ineffective Left Head Turn: Thin liquid (Level 0) Right head turn: Ineffective Ineffective Right Head Turn: Thin liquid (Level 0) Supraglottic swallow: Ineffective Ineffective Supraglottic Swallow: Thin liquid (Level 0)   General Information: Caregiver present: Yes  Diet Prior to this Study: Regular; Thin liquids (Level 0)   No data recorded  No data recorded  Supplemental O2: Nasal cannula   History of Recent Intubation: No  Behavior/Cognition: Alert; Cooperative; Pleasant mood Self-Feeding Abilities: Able to self-feed Baseline vocal quality/speech: Normal Volitional Cough: Able to elicit Volitional Swallow: Able to elicit No data recorded Goal Planning: No data recorded No data recorded No data recorded No data recorded No data recorded Pain: No data recorded End of Session: Start Time:No data recorded Stop Time: No data recorded Time Calculation:No data recorded Charges: No data recorded SLP visit diagnosis: SLP Visit Diagnosis: Dysphagia, unspecified (R13.10) Past Medical History: Past Medical History: Diagnosis Date  Allergic rhinitis   Arthritis   "hands and legs and feet" (04/27/2012)   Asthma   Cancer (HCC) 2005  rt arm mole rem cancer  Chronic bronchitis (HCC)   "keep it all the time" (04/27/2012)  COPD (chronic obstructive pulmonary disease) (HCC)   COVID-19 virus infection   03-2021  Depression   PT DENIES  Diverticulosis   DJD (degenerative joint disease)   Emphysema   Epilepsy (HCC)   Esophageal stricture   Exertional dyspnea   GERD (gastroesophageal reflux disease)   Glaucoma   H/O blood clots   Right leg  Heart failure (HCC)   History of COVID-19 03/2021  Hyperlipidemia   Hypertension   IBS (irritable bowel syndrome)   Migraine   Neck pain   Cervical disc  OSA on CPAP   6-7 yrs; pt wears CPAP but has not been able to use it lately due to sinus issues  Oxygen deficiency   USES 3 LITERS 02 CONTINOUS  Pneumonia 2012; 04/27/2012  Recurrent upper respiratory infection (URI)   Right leg DVT (HCC) 1982  groin  Seizures (HCC)   LAST SEIZURE 6 MONTHS OR  LONGER  Sleep apnea   Type II diabetes mellitus (HCC)  No meds since weight loss  UTI (urinary tract infection)   2 weeks ago  Vertigo 10/24/2014 Past Surgical History: Past Surgical History: Procedure Laterality Date  ABDOMINAL HYSTERECTOMY  2001  "partial"  BIOPSY  04/16/2021  Procedure: BIOPSY;  Surgeon: Napoleon Form, MD;  Location: [facility];  Service: Endoscopy;;  CARPAL TUNNEL RELEASE  ~ 2008  right  CATARACT EXTRACTION Bilateral   COLONOSCOPY    COLONOSCOPY WITH ESOPHAGOGASTRODUODENOSCOPY (EGD)    ESOPHAGOGASTRODUODENOSCOPY (EGD) WITH PROPOFOL N/A 04/16/2021  Procedure: ESOPHAGOGASTRODUODENOSCOPY (EGD) WITH PROPOFOL;  Surgeon: Napoleon Form, MD;  Location: [facility];  Service: Endoscopy;  Laterality: N/A;  KNEE ARTHROSCOPY  1990's  left  NASAL SEPTOPLASTY W/ TURBINOPLASTY Bilateral 05/23/2016  Procedure: NASAL SEPTOPLASTY WITH TURBINATE REDUCTION;  Surgeon: Osborn Coho, MD;  Location[facility];  Service: ENT;  Laterality: Bilateral;  SHOULDER OPEN ROTATOR CUFF REPAIR  ~ 2010  left  SINOSCOPY    SINUS ENDO WITH FUSION Right  05/23/2016  Procedure: LIMITED RIGHT ENDOSCOPIC SINUS SURGERY;  Surgeon: Osborn Coho, MD;  Location: [facility] OR;  Service: ENT;  Laterality: Right;  TOTAL KNEE ARTHROPLASTY  08/23/2012  right  TOTAL KNEE ARTHROPLASTY  08/23/2012  Procedure: TOTAL KNEE ARTHROPLASTY;  Surgeon: Nilda Simmer, MD;  Location: [facility] OR;  Service: Orthopedics;  Laterality: Right;  RIGHT KNEE ARTHROPLASTY MEDIAL AND LATERAL COMPARTMENTS WITH PATELLA RESURFACING  TUBAL LIGATION  2001  UPPER GASTROINTESTINAL ENDOSCOPY   Amelia H. Romie Levee, CCC-SLP Speech Language Pathologist Georgetta Haber 12/17/2022, 4:49 PM CLINICAL DATA:  Dysphagia. Pneumonia 4 times this year EXAM: MODIFIED BARIUM SWALLOW TECHNIQUE: Different consistencies of barium were administered orally to the patient by the Speech Pathologist. Imaging of the pharynx was performed in the lateral projection. The radiologist was present in the fluoroscopy room for this study, providing personal supervision. FLUOROSCOPY: Radiation Exposure Index (as provided by the fluoroscopic device): 55.3 mGy Kerma COMPARISON:  None Available. FINDINGS: Vestibular Penetration: Flash laryngeal penetration with multiple swallows of thin barium and nectar consistency. Contrast passed to the level of the vocal cords. No change in penetration with chin tuck or head turn maneuver. Aspiration:  None seen. Other:  None. IMPRESSION: Laryngeal penetration without aspiration. Please refer to the Speech Pathologists report for complete details and recommendations. Electronically Signed   By: Ulyses Southward M.D.   On: 12/17/2022 15:04  CT ABDOMEN PELVIS W CONTRAST  Result Date: 12/16/2022 CLINICAL DATA:  Abdominal pain, acute, nonlocalized. Vaginal bleeding for 2 weeks. EXAM: CT ABDOMEN AND PELVIS WITH CONTRAST TECHNIQUE: Multidetector CT imaging of the abdomen and pelvis was performed using the standard protocol following bolus administration of intravenous contrast. RADIATION DOSE REDUCTION: This exam was  performed according to the departmental dose-optimization program which includes automated exposure control, adjustment of the mA and/or kV according to patient size and/or use of iterative reconstruction technique. CONTRAST:  OMNIPAQUE IOHEXOL 300 MG/ML  SOLN COMPARISON:  08/05/2022. FINDINGS: Lower chest: Scattered reticular prominence and airspace opacities are noted at the lung bases, improved from the previous exam. Hepatobiliary: No focal liver abnormality is seen. No gallstones, gallbladder wall thickening, or biliary dilatation. Pancreas: Fatty atrophy of the pancreas is noted. No pancreatic ductal dilatation or surrounding inflammatory changes. Spleen: Normal in size without focal abnormality. Adrenals/Urinary Tract: The adrenal glands are within normal limits. The kidneys enhance symmetrically. Renal calculi are noted on the left. An 8 mm calculus is noted in the renal pelvis on the left with no obstructive uropathy. There is fat stranding an urothelial  thickening involving the left renal pelvis and proximal left ureter. The bladder is unremarkable. Stomach/Bowel: Stomach is within normal limits. Appendix appears normal. No evidence of bowel wall thickening, distention, or inflammatory changes. No free air or pneumatosis. Multiple scattered diverticula are present along the colon without evidence of diverticulitis. Vascular/Lymphatic: Aortic atherosclerosis. No enlarged abdominal or pelvic lymph nodes. Reproductive: Status post hysterectomy. No adnexal masses. Other: No abdominopelvic ascites. A small fat containing umbilical hernia is present. Musculoskeletal: Degenerative changes are present in the thoracolumbar spine. No acute osseous abnormality. IMPRESSION: 1. 8 mm calculus in the left renal pelvis with no evidence of obstruction. Urothelial thickening is noted at the left renal pelvis and proximal left ureter which may be infectious or inflammatory. 2. Nonobstructive left renal calculus. 3.  Scattered airspace opacities at the lung bases, improved from the previous exam and may be infectious or inflammatory. 4. Aortic atherosclerosis. Electronically Signed   By: Thornell Sartorius M.D.   On: 12/16/2022 02:36    regadenoson (LEXISCAN) injection SOLN 0.4 mg     Date Action Dose Route User   12/25/2022 0900 Given 0.4 mg Intravenous Susa Loffler A      technetium tetrofosmin (TC-MYOVIEW) injection 11 millicurie     Date Action Dose Route User   12/25/2022 0740 Contrast Given 11 millicurie Intravenous Susa Loffler A      technetium tetrofosmin (TC-MYOVIEW) injection 32.2 millicurie     Date Action Dose Route User   12/25/2022 0900 Contrast Given 32.2 millicurie Intravenous Susa Loffler A          Latest Ref Rng & Units 09/27/2021   10:09 AM  PFT Results  FVC-Pre L 1.71   FVC-Predicted Pre % 59   FVC-Post L 1.82   FVC-Predicted Post % 63   Pre FEV1/FVC % % 58   Post FEV1/FCV % % 58   FEV1-Pre L 0.99   FEV1-Predicted Pre % 45   FEV1-Post L 1.05   DLCO uncorrected ml/min/mmHg 9.41   DLCO UNC% % 52   DLCO corrected ml/min/mmHg 9.41   DLCO COR %Predicted % 52   DLVA Predicted % 72     No results found for: "NITRICOXIDE"      Assessment & Plan:   No problem-specific Assessment & Plan notes found for this encounter.     Rubye Oaks, NP 01/13/2023

## 2023-01-14 DIAGNOSIS — Z01811 Encounter for preprocedural respiratory examination: Secondary | ICD-10-CM | POA: Insufficient documentation

## 2023-01-14 NOTE — Assessment & Plan Note (Signed)
Preop pulmonary risk assessment.  Patient appears to be improved after recent exacerbation appears to be near her baseline.  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  Not excluded but high risk for potential decompensation from a pulmonary standpoint.  These risk were reviewed in detail with patient.   Major Pulmonary risks identified in the multifactorial risk analysis are but not limited to a) pneumonia; b) recurrent intubation risk; c) prolonged or recurrent acute respiratory failure needing mechanical ventilation; d) prolonged hospitalization; e) DVT/Pulmonary embolism; f) Acute Pulmonary edema  Recommend 1. Short duration of surgery as much as possible and avoid paralytic if possible  . Recovery in step down or ICU with Pulmonary consultation if needed . Aggressive pulmonary toilet with o2, bronchodilatation,  and incentive spirometry and early ambulation CPAP therapy in the postop setting if needed

## 2023-01-14 NOTE — Progress Notes (Signed)
Reviewed and agree with assessment/plan.   Ying Rocks, MD [facility] 01/14/2023, 1:29 PM Pager:  [phone]  

## 2023-01-14 NOTE — Assessment & Plan Note (Signed)
No increased oxygen demands.  Continue current settings to maintain O2 saturations greater than 88 to 90%

## 2023-01-14 NOTE — Assessment & Plan Note (Signed)
Recent COPD exacerbation now improving.  Patient is continue on her current maintenance regimen.  Plan  Patient Instructions  Continue on Trelegy 1 puff daily  Albuterol inhaler /neb As needed   PT as planned  Continue on Oxygen 3l/m  Continue on Singulair and Claritin daily  Good luck with upcoming urology procedure.  Follow up with Dr. Craige Cotta next month as planned

## 2023-01-16 ENCOUNTER — Encounter: Payer: Self-pay | Admitting: Urology

## 2023-01-16 ENCOUNTER — Ambulatory Visit (INDEPENDENT_AMBULATORY_CARE_PROVIDER_SITE_OTHER): Payer: 59 | Admitting: Urology

## 2023-01-16 VITALS — BP 102/69 | HR 89 | Temp 98.4°F

## 2023-01-16 DIAGNOSIS — Z9189 Other specified personal risk factors, not elsewhere classified: Secondary | ICD-10-CM

## 2023-01-16 DIAGNOSIS — N200 Calculus of kidney: Secondary | ICD-10-CM

## 2023-01-16 DIAGNOSIS — R310 Gross hematuria: Secondary | ICD-10-CM | POA: Diagnosis not present

## 2023-01-16 DIAGNOSIS — Z87440 Personal history of urinary (tract) infections: Secondary | ICD-10-CM

## 2023-01-16 DIAGNOSIS — R1032 Left lower quadrant pain: Secondary | ICD-10-CM

## 2023-01-16 DIAGNOSIS — Z01818 Encounter for other preprocedural examination: Secondary | ICD-10-CM | POA: Diagnosis not present

## 2023-01-16 LAB — URINALYSIS, ROUTINE W REFLEX MICROSCOPIC
Bilirubin, UA: NEGATIVE
Glucose, UA: NEGATIVE
Ketones, UA: NEGATIVE
Leukocytes,UA: NEGATIVE
Nitrite, UA: NEGATIVE
Protein,UA: NEGATIVE
RBC, UA: NEGATIVE
Specific Gravity, UA: 1.01 (ref 1.005–1.030)
Urobilinogen, Ur: 0.2 mg/dL (ref 0.2–1.0)
pH, UA: 6 (ref 5.0–7.5)

## 2023-01-16 MED ORDER — OXYCODONE-ACETAMINOPHEN 5-325 MG PO TABS
1.0000 | ORAL_TABLET | Freq: Four times a day (QID) | ORAL | 0 refills | Status: DC | PRN
Start: 2023-01-16 — End: 2023-01-27

## 2023-01-16 NOTE — Progress Notes (Signed)
History of Present Illness: [name] is a 67 y.o. female who presents today for a pre-op visit at [facility]. - Relevant past medical history includes CHF, COPD, obstructive sleep apnea on CPAP, recurrent pneumonia and chronic respiratory failure, CKD stage 3, skin cancer, epilepsy, GERD, glaucoma, HLD, HTN, IBS, migraines, prior DVT (1982), T2DM. Current smoker. - GU History: 1. Kidney stones.  At last visit with Dr. Annabell [name] on 12/18/2022: Evaluated for "8mm left renal stone without obstruction but with pain and hematuria. Her stone is visible on the scout film but with her need for Xarelto, I think ureteroscopy would be more appropriate than ESWL. I have reviewed the risks of bleeding, infection, ureteral and renal injury, need for a stent, thrombotic events and anesthetic complications. She will need medical clearance prior to any procedures."   She is scheduled for cystoscopy, left retrograde pyelogram, left ureteroscopy, Holmium laser lithotripsy, and left ureteral stent placement by Dr. Annabell [name] on 01/27/2023.  Since last visit: > 12/23/2022: Seen by cardiology for preop visit.  - "From a cardiac standpoint, she can hold ASA for her surgery. She notes that she does not take this." - Re: History of left leg DVT in 2023 and again in 09/2022: "Her anticoagulation is managed by primary care and she has seen vascular surgery for evaluation in the past. Defer recommendations regarding anticoagulation around the time of surgery to primary care +/- vascular surgery."  - Per Addendum (12/25/22): "Myoview 12/25/22: EF 74, no ischemia or infarction, low risk. Stress test is low risk. Therefore, from a cardiac standpoint, the patient can proceed with her procedure at acceptable risk. No further cardiac testing needed."  > 01/13/2023: Seen by pulmonology for preop risk assessment. Per note: - Patient is "considered a moderate to high risk for surgical procedure.  Not excluded but high risk for  potential decompensation from a pulmonary standpoint."  - Recommendations: 1. Short duration of surgery as much as possible and avoid paralytic if possible  2. Recovery in step down or ICU with Pulmonary consultation if needed 3. Aggressive pulmonary toilet with o2, bronchodilatation, and incentive spirometry and early ambulation CPAP therapy in the postop setting if needed   Upcoming: - 01/22/2023: Scheduled for pre-anesthesia testing visit.  Today: She denies interval stone passage since last visit. She reports intermittent left flank pain and constant left upper quadrant pain. She denies increased urinary urgency, frequency, dysuria, gross hematuria, or fevers. She reports urinary hesitancy and straining to void, but denies sensations of incomplete emptying.  She reports that her vascular specialist told her several months ago that she could discontinue her anticoagulants including Xarelto by this time.    Fall Screening: Do you usually have a device to assist in your mobility? Yes   Medications: Current Outpatient Medications  Medication Sig Dispense Refill   albuterol (PROAIR HFA) 108 (90 Base) MCG/ACT inhaler INHALE TWO PUFFS INTO THE LUNGS EVERY 6 HOURS AS NEEDED FOR SHORTNESS OF BREATH 18 g 6   albuterol (PROVENTIL) (2.5 MG/3ML) 0.083% nebulizer solution USE 1 VIAL IN NEBULIZER EVERY 6 HOURS AS NEEDED FOR SHORTNESS OF BREATH 75 mL 8   allopurinol (ZYLOPRIM) 100 MG tablet Take 100 mg by mouth daily.     ALPRAZolam (XANAX) 0.25 MG tablet Take 0.25 mg by mouth 2 (two) times daily.     aspirin EC 81 MG tablet Take 81 mg by mouth daily. Swallow whole.     Azelastine HCl (ASTEPRO) 0.15 % SOLN Place 1 spray into  the nose 2 (two) times daily as needed (Allergies). 30 mL 5   cephALEXin (KEFLEX) 500 MG capsule Take 1 capsule (500 mg total) by mouth 4 (four) times daily. 40 capsule 0   Cholecalciferol (VITAMIN D3) 50 MCG (2000 UT) capsule TAKE ONE CAPSULE BY MOUTH DAILY 90 capsule 1    famotidine (PEPCID) 40 MG tablet Take 40 mg by mouth at bedtime.     ferrous sulfate 324 (65 Fe) MG TBEC TAKE ONE TABLET BY MOUTH EVERY OTHER DAY 90 tablet 5   gabapentin (NEURONTIN) 100 MG capsule Take 1 capsule (100 mg total) by mouth at bedtime. (Patient taking differently: Take 100 mg by mouth 3 (three) times daily.) 90 capsule 3   guaiFENesin (MUCINEX) 600 MG 12 hr tablet Take 2 tablets (1,200 mg total) by mouth 2 (two) times daily as needed. 60 tablet 5   lamoTRIgine (LAMICTAL) 200 MG tablet TAKE 1 AND 1/2 TABLETS BY MOUTH TWICE DAILY 270 tablet 1   loratadine (ALLERGY RELIEF) 10 MG tablet Take 1 tablet (10 mg total) by mouth daily. 90 tablet 3   montelukast (SINGULAIR) 10 MG tablet TAKE ONE TABLET BY MOUTH AT BEDTIME 30 tablet 5   OXYGEN Inhale 3 L into the lungs continuous.     pantoprazole (PROTONIX) 40 MG tablet Take 1 tablet (40 mg total) by mouth 2 (two) times daily. **PLEASE CALL OFFICE TO SCHEDULE FOLLOW UP 60 tablet 0   QUEtiapine (SEROQUEL) 100 MG tablet TAKE ONE TABLET BY MOUTH AT BEDTIME 30 tablet 5   simvastatin (ZOCOR) 40 MG tablet TAKE ONE TABLET BY MOUTH AT BEDTIME 90 tablet 2   tiZANidine (ZANAFLEX) 4 MG tablet TAKE ONE TABLET BY MOUTH EVERY 6 HOURS AS NEEDED FOR MUSCLE SPASMS FOR UP TO 30 DOSES 15 tablet 1   topiramate (TOPAMAX) 100 MG tablet Take 200 mg twice daily 360 tablet 2   TRELEGY ELLIPTA 100-62.5-25 MCG/ACT AEPB INHALE 1 PUFF BY MOUTH INTO THE LUNGS DAILY IN THE AFTERNOON 60 each 5   triamcinolone cream (KENALOG) 0.1 % Apply 1 Application topically daily as needed (irritation).     XARELTO 20 MG TABS tablet Take 20 mg by mouth daily.     oxyCODONE-acetaminophen (PERCOCET/ROXICET) 5-325 MG tablet Take 1 tablet by mouth every 6 (six) hours as needed for severe pain. 15 tablet 0   No current facility-administered medications for this visit.    Allergies: Allergies  Allergen Reactions   Amoxicillin Anaphylaxis    Breakout, mouth swells   Azithromycin Swelling  and Rash    "swelling all over, hives, thrush"   Cefdinir     Throat swelling after 3rd dose   Chantix [Varenicline] Other (See Comments)    "bad dreams, difficulty breathing"   Doxycycline Hyclate Anaphylaxis and Rash    swelling   Levofloxacin Anaphylaxis    Pt can take moxifloxacin   Penicillins Anaphylaxis, Shortness Of Breath and Rash    "ALMOST DIED, ended up in hospital"  PCN reaction causing immediate rash, facial/tongue/throat swelling, SOB or lightheadedness with hypotension: YES PCN reaction causing severe rash involving mucus membranes or skin necrosis: NO PCN reaction that required hospitalization YES Has patient had a PCN reaction occurring within the last 10 years: NO    Sulfonamide Derivatives Shortness Of Breath, Swelling and Rash    "break out from head to toe"   Toradol [Ketorolac Tromethamine] Other (See Comments)    Seizure    Clarithromycin Hives    swelling   Nortriptyline Swelling, Rash and  Other (See Comments)    Per patient had "kidney problems" on this medication, could not use bathroom (urinary retention)   Paxlovid [Nirmatrelvir-Ritonavir] Swelling    Throat swelling   Codeine Nausea And Vomiting   Fluticasone-Salmeterol Other (See Comments)    REACTION: ulcers in mouth   Triptans Rash    Mouth breaks out and start itching    Past Medical History:  Diagnosis Date   Allergic rhinitis    Arthritis    "hands and legs and feet" (04/27/2012)   Asthma    Cancer (HCC) 2005   rt arm mole rem cancer   CHF (congestive heart failure) (HCC)    Chronic bronchitis (HCC)    "keep it all the time" (04/27/2012)   Chronic kidney disease    Chronic kidney disease (CKD)    Stage III   COPD (chronic obstructive pulmonary disease) (HCC)    COVID-19 virus infection    03-2021   Depression    PT DENIES   Diverticulosis    DJD (degenerative joint disease)    Emphysema    Epilepsy (HCC)    Epilepsy (HCC)    Esophageal stricture    Exertional dyspnea     GERD (gastroesophageal reflux disease)    Glaucoma    H/O blood clots    Right leg   Heart failure (HCC)    History of COVID-19 03/2021   History of nuclear stress test    Myoview 12/25/22: EF 74, no ischemia or infarction, low risk   Hyperlipidemia    Hypertension    IBS (irritable bowel syndrome)    Migraine    Neck pain    Cervical disc   OSA on CPAP    6-7 yrs; pt wears CPAP but has not been able to use it lately due to sinus issues   Oxygen deficiency    USES 3 LITERS 02 CONTINOUS   Pneumonia 2012; 04/27/2012   Recurrent upper respiratory infection (URI)    Right leg DVT (HCC) 1982   groin   Seizures (HCC)    LAST SEIZURE 6 MONTHS OR  LONGER   Sleep apnea    Type II diabetes mellitus (HCC)    No meds since weight loss   UTI (urinary tract infection)    2 weeks ago   Vertigo 10/24/2014   Past Surgical History:  Procedure Laterality Date   ABDOMINAL HYSTERECTOMY  2001   "partial"   BIOPSY  04/16/2021   Procedure: BIOPSY;  Surgeon: Napoleon Form, MD;  Location: [facility];  Service: Endoscopy;;   CARPAL TUNNEL RELEASE  ~ 2008   right   CATARACT EXTRACTION Bilateral    COLONOSCOPY     COLONOSCOPY WITH ESOPHAGOGASTRODUODENOSCOPY (EGD)     ESOPHAGOGASTRODUODENOSCOPY (EGD) WITH PROPOFOL N/A 04/16/2021   Procedure: ESOPHAGOGASTRODUODENOSCOPY (EGD) WITH PROPOFOL;  Surgeon: Napoleon Form, MD;  Location: [facility];  Service: Endoscopy;  Laterality: N/A;   KNEE ARTHROSCOPY  1990's   left   NASAL SEPTOPLASTY W/ TURBINOPLASTY Bilateral 05/23/2016   Procedure: NASAL SEPTOPLASTY WITH TURBINATE REDUCTION;  Surgeon: Osborn Coho, MD;  Location: [facility] OR;  Service: ENT;  Laterality: Bilateral;   SHOULDER OPEN ROTATOR CUFF REPAIR  ~ 2010   left   SINOSCOPY     SINUS ENDO WITH FUSION Right 05/23/2016   Procedure: LIMITED RIGHT ENDOSCOPIC SINUS SURGERY;  Surgeon: Osborn Coho, MD;  Location: [facility] OR;  Service: ENT;  Laterality: Right;   TOTAL KNEE ARTHROPLASTY   08/23/2012   right  TOTAL KNEE ARTHROPLASTY  08/23/2012   Procedure: TOTAL KNEE ARTHROPLASTY;  Surgeon: Nilda Simmer, MD;  Location: [facility] OR;  Service: Orthopedics;  Laterality: Right;  RIGHT KNEE ARTHROPLASTY MEDIAL AND LATERAL COMPARTMENTS WITH PATELLA RESURFACING   TUBAL LIGATION  2001   UPPER GASTROINTESTINAL ENDOSCOPY      Family History  Problem Relation Age of Onset   Diabetes Mother    Lung cancer Mother        was a smoker   Stroke Mother    Alcohol abuse Father    Lung cancer Father        was a smoker   Emphysema Father        was a smoker   Heart disease Sister    Other Sister        blood clotting disorder   Diabetes Brother    Seizures Brother    Diabetes Maternal Aunt    Breast cancer Maternal Aunt    Esophageal cancer Maternal Uncle    Diabetes Maternal Uncle    Heart disease Maternal Grandfather    Diabetes Maternal Grandfather    Bipolar disorder Son    Muscular dystrophy Son    Heart disease Son    Heart disease Other        uncle   Colon cancer Neg Hx    Pancreatic cancer Neg Hx    Stomach cancer Neg Hx    Colon polyps Neg Hx    Rectal cancer Neg Hx    Social History   Socioeconomic History   Marital status: Married    Spouse name: Westly Pam   Number of children: 2   Years of education: 12   Highest education level: Not on file  Occupational History   Occupation: disabled  Tobacco Use   Smoking status: Every Day    Packs/day: 2.00    Years: 40.00    Additional pack years: 0.00    Total pack years: 80.00    Types: Cigarettes    Start date: 08/12/1975    Last attempt to quit: 12/30/2020    Years since quitting: 2.0   Smokeless tobacco: Never   Tobacco comments:    Smoking 6 per day.  Trying to quit.  01/13/2023  Vaping Use   Vaping Use: Never used  Substance and Sexual Activity   Alcohol use: No    Alcohol/week: 0.0 standard drinks of alcohol   Drug use: No   Sexual activity: Never  Other Topics Concern   Not on file  Social History  Narrative   Patient lives at home with husband Westly Pam, her son and his wife.    Patient has 2 children.    Patient has a high school education.    Patient is currently unemployed.    Patient is right handed.    Patient drinks 2 cups of caffeine per day.   Social Determinants of Health   Financial Resource Strain: High Risk (09/13/2021)   Overall Financial Resource Strain (CARDIA)    Difficulty of Paying Living Expenses: Very hard  Food Insecurity: Food Insecurity Present (09/13/2021)   Hunger Vital Sign    Worried About Running Out of Food in the Last Year: Often true    Ran Out of Food in the Last Year: Often true  Transportation Needs: No Transportation Needs (09/13/2021)   PRAPARE - Administrator, Civil Service (Medical): No    Lack of Transportation (Non-Medical): No  Physical Activity: Inactive (08/13/2021)   Exercise Vital Sign  Days of Exercise per Week: 0 days    Minutes of Exercise per Session: 0 min  Stress: Not on file  Social Connections: Moderately Integrated (08/13/2021)   Social Connection and Isolation Panel [NHANES]    Frequency of Communication with Friends and Family: More than three times a week    Frequency of Social Gatherings with Friends and Family: More than three times a week    Attends Religious Services: Never    Database administrator or Organizations: Yes    Attends Banker Meetings: Never    Marital Status: Married  Catering manager Violence: Not on file    SUBJECTIVE  Review of Systems Constitutional: Patient denies any unintentional weight loss or change in strength lntegumentary: Patient denies any rashes or pruritus Eyes: Patient denies dry eyes ENT: Patient denies dry mouth Cardiovascular: Patient denies chest pain or syncope Respiratory: Patient on oxygen  Gastrointestinal: Patient denies nausea, vomiting, constipation, or diarrhea Musculoskeletal: Patient denies muscle cramps or new weakness Neurologic: Patient  denies convulsions or seizures Psychiatric: Patient denies memory problems Allergic/Immunologic: Patient denies recent allergic reaction(s) Hematologic/Lymphatic: Patient denies bleeding tendencies Endocrine: Patient denies heat/cold intolerance  GU: As per HPI.  OBJECTIVE Vitals:   01/16/23 1112  BP: 102/69  Pulse: 89  Temp: 98.4 F (36.9 C)   There is no height or weight on file to calculate BMI.  Physical Examination  Constitutional: No obvious distress; patient is non-toxic appearing  Cardiovascular: No visible lower extremity edema.  Respiratory: The patient does not have audible wheezing/stridor; respirations do not appear labored  Gastrointestinal: Abdomen non-distended Musculoskeletal: Normal ROM of UEs  Skin: No obvious rashes/open sores  Neurologic: CN 2-12 grossly intact Psychiatric: Answered questions appropriately with normal affect  Hematologic/Lymphatic/Immunologic: No obvious bruises or sites of spontaneous bleeding  UA: negative  ASSESSMENT Kidney stone on left side - Plan: Urinalysis, Routine w reflex microscopic, oxyCODONE-acetaminophen (PERCOCET/ROXICET) 5-325 MG tablet  Preop examination - Plan: Urinalysis, Routine w reflex microscopic, oxyCODONE-acetaminophen (PERCOCET/ROXICET) 5-325 MG tablet  Gross hematuria - Plan: Urinalysis, Routine w reflex microscopic, oxyCODONE-acetaminophen (PERCOCET/ROXICET) 5-325 MG tablet  LLQ pain - Plan: Urinalysis, Routine w reflex microscopic, oxyCODONE-acetaminophen (PERCOCET/ROXICET) 5-325 MG tablet  History of UTI - Plan: Urinalysis, Routine w reflex microscopic, oxyCODONE-acetaminophen (PERCOCET/ROXICET) 5-325 MG tablet  At risk for surgical complication - Plan: Urinalysis, Routine w reflex microscopic, oxyCODONE-acetaminophen (PERCOCET/ROXICET) 5-325 MG tablet  Kidney stones - Plan: Urinalysis, Routine w reflex microscopic, oxyCODONE-acetaminophen (PERCOCET/ROXICET) 5-325 MG tablet  We discussed surgical plan  and preop workup. OK to hold anticoagulants 48 hours preop per patient discussion with other providers. For pain she was given a 5 day supply of Percocet for PRN use; we discussed appropriate use and potential risks. No evidence of UTI today. Advised to proceed with pre-anesthesia testing visit on 01/22/2023 as scheduled. Pt verbalized understanding and agreement. All questions were answered.  Dr. Annabell [name] was consulted prior to today's visit.  Consult note sent to Vascular specialist (Dr. Heath Lark) to definitively confirm patient report re: Xarelto discontinuation.  PLAN Advised the following: Hold anticoagulants 48 hours preop. Percocet PRN.  Pre-anesthesia testing visit on 01/22/2023.  Surgery with Dr. Annabell [name] on 01/27/2023.   Orders Placed This Encounter  Procedures   Urinalysis, Routine w reflex microscopic    It has been explained that the patient is to follow regularly with their PCP in addition to all other providers involved in their care and to follow instructions provided by these respective offices. Patient advised to contact urology  clinic if any urologic-pertaining questions, concerns, new symptoms or problems arise in the interim period.  There are no Patient Instructions on file for this visit.  Electronically signed by:  Donnita Falls, MSN, FNP-C, CUNP 01/16/2023 12:06 PM

## 2023-01-19 ENCOUNTER — Telehealth: Payer: Self-pay

## 2023-01-19 ENCOUNTER — Ambulatory Visit: Payer: 59 | Admitting: Neurology

## 2023-01-19 NOTE — Telephone Encounter (Signed)
----- Message from [facility], LPN sent at 1/61/0960  2:34 PM EDT ----- Regarding: FW:  ----- Message ----- From: Tilden Dome, CMA Sent: 01/19/2023   2:22 PM EDT To: Gustavus Messing, LPN Subject: RE:                                            I called patient and verified with her to stop Xarelto 48 hrs prior to 6/18 and she can continue the 6 month course when it is deemed safe to post surgery,.  Patient stated that she understood.  ----- Message ----- From: Gustavus Messing, LPN Sent: 4/54/0981   1:25 PM EDT To: Tilden Dome, CMA Subject: FW:                                             ----- Message ----- From: Donnita Falls, FNP Sent: 01/19/2023   1:19 PM EDT To: [facility] Subject: FW:                                            Please let patient know that her Vascular specialist Dr. Lenell Antu confirmed that she can hold her Xarelto for 48 hours prior to her ESWL on 01/27/2023 and then he wants her to resume it as soon as it's safe to do so postop to complete a 6 month course. Thanks. Maralyn Sago NP    ----- Message ----- From: Leonie Douglas, MD Sent: 01/18/2023   5:57 PM EDT To: Donnita Falls, FNP Subject: RE:                                            Yes that is fine. Would resume Xarelto as soon as safe from a surgery perspective. She can then complete a 6 month course.  Thanks Tom ----- Message ----- From: Donnita Falls, FNP Sent: 01/16/2023   8:23 AM EDT To: Leonie Douglas, MD  Dr. Lenell Antu,   [name] is a shared patient of ours. She is scheduled for cystoscopy, left retrograde pyelogram, left ureteroscopy, Holmium laser lithotripsy, and left ureteral stent placement by Dr. Annabell Howells on 01/27/2023.  She was seen by cardiology for preop visit on 12/23/2022. Per that note:  - "From a cardiac standpoint, she can hold ASA for her surgery. She notes that she does not take this." - Re: History of left leg DVT in 2023 and again in 09/2022: "Her anticoagulation  is managed by primary care and she has seen vascular surgery for evaluation in the past. Defer recommendations regarding anticoagulation around the time of surgery to primary care +/- vascular surgery."  - Per Addendum (12/25/22): "Myoview 12/25/22: EF 74, no ischemia or infarction, low risk. Stress test is low risk. Therefore, from a cardiac standpoint, the patient can proceed with her procedure at acceptable risk. No further cardiac testing needed."  Per current med list it appears that she is on Xarelto. Can she hold that for 48 hours preop? Please advise.   Thank you,  Evette Georges, MSN, FNP-C, [facility] Nurse Practitioner [facility]

## 2023-01-19 NOTE — Progress Notes (Signed)
Addendum 01/19/2023:   Received confirmation from patient's Vascular specialist Dr. Lenell Antu that Mrs. Patman can hold her Xarelto for 48 hours prior to her ESWL on 01/27/2023. Dr. Annabell Howells notified. Urology staff instructed to make patient aware.   Evette Georges, MSN, FNP-C, CUNP

## 2023-01-19 NOTE — Telephone Encounter (Signed)
Patient called made aware of stopping Xarelto 48 hrs prior to 01/27/23, and can safely continue the 6 mth course post op.

## 2023-01-21 NOTE — Progress Notes (Addendum)
COVID Vaccine Completed: no  Date of COVID positive in last 90 days: no  PCP - Colvin Caroli, FNP Cardiologist - Nanetta Batty, MD Pulmonologist- Coralyn Helling, MD  Cardiac clearance by Tereso Newcomer 12/23/22 in Epic  Pulmonary clearance by Tammy Parrett 01/13/23 in Epic  Chest CT- 09/18/22 [facility] Chest x-ray - 09/30/22 Epic EKG - 12/23/22 Epic Stress Test - 12/25/22 Epic ECHO - 09/03/21 Epic Cardiac Cath - n/a Pacemaker/ICD device last checked: n/ a Spinal Cord Stimulator: n/a  Bowel Prep - no  Sleep Study - yes CPAP - no  Fasting Blood Sugar - pre DM per pt, 129 Checks Blood Sugar once a day, with son's glucometer. Never had to take medications  Last dose of GLP1 agonist-  N/A GLP1 instructions:  N/A   Last dose of SGLT-2 inhibitors-  N/A SGLT-2 instructions: N/A   Blood Thinner Instructions:  Xarelto, hold 4 days Aspirin Instructions:  Last Dose: 01/21/23 0600  Activity level: Can perform activities of daily living without stopping and without symptoms of chest pain. No stairs due knee arthritis and weakness. Uses motorized wheelchair. Walker in bathroom. Pt has frequent falls. Last one 3 weeks ago. SOB with activity, has COPD.   Anesthesia review: HTN, HFrEF, OSA, COPD, O2 3L, acute respiratory failure, dysphagia, seizure las one 6 months ago, emphysema, blood clots, scabbed over bed sore to buttock, per pt surgeon's office aware.  Patient denies shortness of breath, fever, cough and chest pain at PAT appointment  Patient verbalized understanding of instructions that were given to them at the PAT appointment. Patient was also instructed that they will need to review over the PAT instructions again at home before surgery.

## 2023-01-21 NOTE — Patient Instructions (Addendum)
SURGICAL WAITING ROOM VISITATION  Patients having surgery or a procedure may have no more than 2 support people in the waiting area - these visitors may rotate.    Children under the age of 82 must have an adult with them who is not the patient.  Due to an increase in RSV and influenza rates and associated hospitalizations, children ages 51 and under may not visit patients in [facility] hospitals.  If the patient needs to stay at the hospital during part of their recovery, the visitor guidelines for inpatient rooms apply. Pre-op nurse will coordinate an appropriate time for 1 support person to accompany patient in pre-op.  This support person may not rotate.    Please refer to the [facility] website for the visitor guidelines for Inpatients (after your surgery is over and you are in a regular room).    Your procedure is scheduled on: 01/27/23   Report to [facility] Main Entrance    Report to admitting at 11:00 AM   Call this number if you have problems the morning of surgery [phone]   Do not eat food or drink liquids:After Midnight.          If you have questions, please contact your surgeon's office.   FOLLOW BOWEL PREP AND ANY ADDITIONAL PRE OP INSTRUCTIONS YOU RECEIVED FROM YOUR SURGEON'S OFFICE!!!     Oral Hygiene is also important to reduce your risk of infection.                                    Remember - BRUSH YOUR TEETH THE MORNING OF SURGERY WITH YOUR REGULAR TOOTHPASTE  DENTURES WILL BE REMOVED PRIOR TO SURGERY PLEASE DO NOT APPLY "Poly grip" OR ADHESIVES!!!   Do NOT smoke after Midnight   Take these medicines the morning of surgery with A SIP OF WATER: Inhalers, nebulizers, Allopurinol, Alprazolam, Gabapentin, Lamotrigine, Loratadine, Percocet, Pantoprazole, Topiramate                              You may not have any metal on your body including hair pins, jewelry, and body piercing             Do not wear make-up, lotions, powders, perfumes, or  deodorant  Do not wear nail polish including gel and S&S, artificial/acrylic nails, or any other type of covering on natural nails including finger and toenails. If you have artificial nails, gel coating, etc. that needs to be removed by a nail salon please have this removed prior to surgery or surgery may need to be canceled/ delayed if the surgeon/ anesthesia feels like they are unable to be safely monitored.     Do not bring valuables to the hospital. [facility] IS NOT             RESPONSIBLE   FOR VALUABLES.   Contacts, glasses, dentures or bridgework may not be worn into surgery.  DO NOT BRING YOUR HOME MEDICATIONS TO THE HOSPITAL. PHARMACY WILL DISPENSE MEDICATIONS LISTED ON YOUR MEDICATION LIST TO YOU DURING YOUR ADMISSION IN THE HOSPITAL!    Patients discharged on the day of surgery will not be allowed to drive home.  Someone NEEDS to stay with you for the first 24 hours after anesthesia.   Special Instructions: Bring a copy of your healthcare power of attorney and living will documents the  day of surgery if you haven't scanned them before.              Please read over the following fact sheets you were given: IF YOU HAVE QUESTIONS ABOUT YOUR PRE-OP INSTRUCTIONS PLEASE CALL [phone] Contras   If you received a COVID test during your pre-op visit  it is requested that you wear a mask when out in public, stay away from anyone that may not be feeling well and notify your surgeon if you develop symptoms. If you test positive for Covid or have been in contact with anyone that has tested positive in the last 10 days please notify you surgeon.    [address] - Preparing for Surgery Before surgery, you can play an important role.  Because skin is not sterile, your skin needs to be as free of germs as possible.  You can reduce the number of germs on your skin by washing with CHG (chlorahexidine gluconate) soap before surgery.  CHG is an antiseptic cleaner which kills germs and bonds with  the skin to continue killing germs even after washing. Please DO NOT use if you have an allergy to CHG or antibacterial soaps.  If your skin becomes reddened/irritated stop using the CHG and inform your nurse when you arrive at Short Stay. Do not shave (including legs and underarms) for at least 48 hours prior to the first CHG shower.  You may shave your face/neck.  Please follow these instructions carefully:  1.  Shower with CHG Soap the night before surgery and the  morning of surgery.  2.  If you choose to wash your hair, wash your hair first as usual with your normal  shampoo.  3.  After you shampoo, rinse your hair and body thoroughly to remove the shampoo.                             4.  Use CHG as you would any other liquid soap.  You can apply chg directly to the skin and wash.  Gently with a scrungie or clean washcloth.  5.  Apply the CHG Soap to your body ONLY FROM THE NECK DOWN.   Do   not use on face/ open                           Wound or open sores. Avoid contact with eyes, ears mouth and   genitals (private parts).                       Wash face,  Genitals (private parts) with your normal soap.             6.  Wash thoroughly, paying special attention to the area where your    surgery  will be performed.  7.  Thoroughly rinse your body with warm water from the neck down.  8.  DO NOT shower/wash with your normal soap after using and rinsing off the CHG Soap.                9.  Pat yourself dry with a clean towel.            10.  Wear clean pajamas.            11.  Place clean sheets on your bed the night of your first shower and do not  sleep with pets.  Day of Surgery : Do not apply any lotions/deodorants the morning of surgery.  Please wear clean clothes to the hospital/surgery center.  FAILURE TO FOLLOW THESE INSTRUCTIONS MAY RESULT IN THE CANCELLATION OF YOUR SURGERY  PATIENT SIGNATURE_________________________________  NURSE  SIGNATURE__________________________________  ________________________________________________________________________

## 2023-01-22 ENCOUNTER — Encounter (HOSPITAL_COMMUNITY)
Admission: RE | Admit: 2023-01-22 | Discharge: 2023-01-22 | Disposition: A | Discharge status: Home / Self Care | Payer: 59 | Source: Ambulatory Visit | Attending: Urology | Admitting: Urology

## 2023-01-22 ENCOUNTER — Other Ambulatory Visit: Payer: Self-pay

## 2023-01-22 ENCOUNTER — Encounter (HOSPITAL_COMMUNITY): Payer: Self-pay

## 2023-01-22 ENCOUNTER — Ambulatory Visit: Payer: 59 | Admitting: Adult Health

## 2023-01-22 VITALS — BP 98/66 | HR 94 | Temp 97.8°F | Resp 18 | Ht 61.0 in | Wt 164.0 lb

## 2023-01-22 DIAGNOSIS — Z7901 Long term (current) use of anticoagulants: Secondary | ICD-10-CM | POA: Diagnosis not present

## 2023-01-22 DIAGNOSIS — I130 Hypertensive heart and chronic kidney disease with heart failure and stage 1 through stage 4 chronic kidney disease, or unspecified chronic kidney disease: Secondary | ICD-10-CM | POA: Diagnosis not present

## 2023-01-22 DIAGNOSIS — J449 Chronic obstructive pulmonary disease, unspecified: Secondary | ICD-10-CM | POA: Diagnosis not present

## 2023-01-22 DIAGNOSIS — E1122 Type 2 diabetes mellitus with diabetic chronic kidney disease: Secondary | ICD-10-CM | POA: Insufficient documentation

## 2023-01-22 DIAGNOSIS — I5032 Chronic diastolic (congestive) heart failure: Secondary | ICD-10-CM

## 2023-01-22 DIAGNOSIS — E785 Hyperlipidemia, unspecified: Secondary | ICD-10-CM | POA: Insufficient documentation

## 2023-01-22 DIAGNOSIS — H409 Unspecified glaucoma: Secondary | ICD-10-CM | POA: Diagnosis not present

## 2023-01-22 DIAGNOSIS — I700 Atherosclerosis of aorta: Secondary | ICD-10-CM | POA: Insufficient documentation

## 2023-01-22 DIAGNOSIS — F32A Depression, unspecified: Secondary | ICD-10-CM | POA: Insufficient documentation

## 2023-01-22 DIAGNOSIS — N189 Chronic kidney disease, unspecified: Secondary | ICD-10-CM | POA: Insufficient documentation

## 2023-01-22 DIAGNOSIS — Z87440 Personal history of urinary (tract) infections: Secondary | ICD-10-CM | POA: Diagnosis not present

## 2023-01-22 DIAGNOSIS — Z87442 Personal history of urinary calculi: Secondary | ICD-10-CM | POA: Diagnosis not present

## 2023-01-22 DIAGNOSIS — Z01818 Encounter for other preprocedural examination: Secondary | ICD-10-CM | POA: Insufficient documentation

## 2023-01-22 DIAGNOSIS — G40909 Epilepsy, unspecified, not intractable, without status epilepticus: Secondary | ICD-10-CM | POA: Diagnosis not present

## 2023-01-22 DIAGNOSIS — G4733 Obstructive sleep apnea (adult) (pediatric): Secondary | ICD-10-CM | POA: Insufficient documentation

## 2023-01-22 DIAGNOSIS — Z8616 Personal history of COVID-19: Secondary | ICD-10-CM | POA: Insufficient documentation

## 2023-01-22 DIAGNOSIS — K219 Gastro-esophageal reflux disease without esophagitis: Secondary | ICD-10-CM | POA: Insufficient documentation

## 2023-01-22 DIAGNOSIS — I509 Heart failure, unspecified: Secondary | ICD-10-CM | POA: Diagnosis not present

## 2023-01-22 DIAGNOSIS — N200 Calculus of kidney: Secondary | ICD-10-CM | POA: Insufficient documentation

## 2023-01-22 DIAGNOSIS — F419 Anxiety disorder, unspecified: Secondary | ICD-10-CM | POA: Diagnosis not present

## 2023-01-22 DIAGNOSIS — Z86718 Personal history of other venous thrombosis and embolism: Secondary | ICD-10-CM | POA: Diagnosis not present

## 2023-01-22 HISTORY — DX: Anxiety disorder, unspecified: F41.9

## 2023-01-22 HISTORY — DX: Personal history of urinary calculi: Z87.442

## 2023-01-22 LAB — BASIC METABOLIC PANEL
Anion gap: 8 (ref 5–15)
BUN: 18 mg/dL (ref 8–23)
CO2: 23 mmol/L (ref 22–32)
Calcium: 8.9 mg/dL (ref 8.9–10.3)
Chloride: 106 mmol/L (ref 98–111)
Creatinine, Ser: 1.01 mg/dL — ABNORMAL HIGH (ref 0.44–1.00)
GFR, Estimated: >60 mL/min (ref >60)
Glucose, Bld: 88 mg/dL (ref 70–99)
Potassium: 3.9 mmol/L (ref 3.5–5.1)
Sodium: 137 mmol/L (ref 135–145)

## 2023-01-22 LAB — CBC
HCT: 35.1 % — ABNORMAL LOW (ref 36.0–46.0)
Hemoglobin: 10.7 g/dL — ABNORMAL LOW (ref 12.0–15.0)
MCH: 30.3 pg (ref 26.0–34.0)
MCHC: 30.5 g/dL (ref 30.0–36.0)
MCV: 99.4 fL (ref 80.0–100.0)
Platelets: 219 10*3/uL (ref 150–400)
RBC: 3.53 MIL/uL — ABNORMAL LOW (ref 3.87–5.11)
RDW: 14.6 % (ref 11.5–15.5)
WBC: 9.9 10*3/uL (ref 4.0–10.5)
nRBC: 0 % (ref 0.0–0.2)

## 2023-01-23 NOTE — Anesthesia Preprocedure Evaluation (Addendum)
Anesthesia Evaluation  Patient identified by MRN, date of birth, ID band Patient awake    Reviewed: Allergy & Precautions, NPO status , Patient's Chart, lab work & pertinent test results  History of Anesthesia Complications Negative for: history of anesthetic complications  Airway Mallampati: III  TM Distance: >3 FB Neck ROM: Full    Dental  (+) Edentulous Lower, Edentulous Upper   Pulmonary asthma , sleep apnea , COPD,  COPD inhaler and oxygen dependent, Current SmokerPatient did not abstain from smoking.    + decreased breath sounds+ wheezing      Cardiovascular hypertension, +CHF and + DVT  Normal cardiovascular exam   '24 Myoperfusion - The study is normal. The study is low risk.   No ST deviation was noted.   LV perfusion is normal. There is no evidence of ischemia. There is no evidence of infarction.   Left ventricular function is normal. Nuclear stress EF: 74 %. The left ventricular ejection fraction is hyperdynamic (>65%). End diastolic cavity size is normal. End systolic cavity size is normal.    Neuro/Psych  Headaches, Seizures - (seizures 1-2x/yr, last ~6 mo ago), Well Controlled,  PSYCHIATRIC DISORDERS Anxiety Depression     Vertigo     GI/Hepatic Neg liver ROS,GERD  Medicated and Controlled,, IBS    Endo/Other  diabetes, Type 2   Obesity   Renal/GU CRFRenal disease     Musculoskeletal  (+) Arthritis ,    Abdominal   Peds  Hematology  (+) Blood dyscrasia, anemia  On xarelto    Anesthesia Other Findings   Reproductive/Obstetrics                             Anesthesia Physical Anesthesia Plan  ASA: 4  Anesthesia Plan: General   Post-op Pain Management: Tylenol PO (pre-op)*   Induction: Intravenous  PONV Risk Score and Plan: 2 and Treatment may vary due to age or medical condition, Ondansetron and Dexamethasone  Airway Management Planned: LMA  Additional  Equipment: None  Intra-op Plan:   Post-operative Plan: Extubation in OR  Informed Consent: I have reviewed the patients History and Physical, chart, labs and discussed the procedure including the risks, benefits and alternatives for the proposed anesthesia with the patient or authorized representative who has indicated his/her understanding and acceptance.     Dental advisory given  Plan Discussed with: CRNA and Anesthesiologist  Anesthesia Plan Comments: (See PAT note )        Anesthesia Quick Evaluation

## 2023-01-23 NOTE — Progress Notes (Signed)
Choose an anesthesia record to view details        DISCUSSION: [name] is a 67 yo female who presents to PAT prior to kidney stone surgery with Dr. Annabell Howells on 01/27/23. She has multiple co-morbities including current smoking, HTN, severe COPD with exertional DOE and chronic respiratory failure (uses 3L O2 24/7), OSA on CPAP, recurrent pneumonia, CHF, HTN, GERD, hx of DVT on chronic anticoagulation, T2DM, epilepsy.   No prior anesthesia complications  She was scheduled for kidney stone surgery last month however had a COPD exacerbation and was treated with antibiotics steroids so was rescheduled until now. She followed up with her Pulmonologist on 01/13/23 and was noted to be back to her baseline. Pre-op risk assessment provided:   "Preop pulmonary risk assessment.  Patient appears to be improved after recent exacerbation appears to be near her baseline.  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  Not excluded but high risk for potential decompensation from a pulmonary standpoint.  These risk were reviewed in detail with patient.    Recommend 1. Short duration of surgery as much as possible and avoid paralytic if possible  . Recovery in step down or ICU with Pulmonary consultation if needed . Aggressive pulmonary toilet with o2, bronchodilatation,  and incentive spirometry and early ambulation CPAP therapy in the postop setting if needed"  She follows with Cardiology for hx of CHF in the setting of pneumonia in the past. She does not currently take diuretics and appeared euvolemic at last office visit on 12/23/22. A stress test was recommended for risk stratification which was completed on 12/25/22 which was low risk and clearance was provided:   "Myoview 12/25/22: EF 74, no ischemia or infarction, low risk. Stress test is low risk. Therefore, from a cardiac standpoint, the patient can proceed with her procedure at  acceptable risk. 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 No intervention was recommended due to her severe COPD but Xarelto was recommended for 3-6 months. She last seen on 10/14/22 by Dr. Lenell Antu. She has been cleared for surgery with ok to hold for 48 hours prior to procedure and to resume post-op to complete 6 month course. Last dose of Xarelto is 6/12  Patient also has hx of epilepsy and takes Topiramate and Lamotrigine. Has not had a documented seizure since March 2023. Follows with Neurology.  VS: BP 98/66   Pulse 94   Temp 36.6 C (Oral)   Resp 18   Ht 5\' 1"  (1.549 m)   Wt 74.4 kg   SpO2 97%   BMI 30.99 kg/m   PROVIDERS: Bucio, Julian Reil, FNP Cardiology: Nanetta Batty, MD Pulmonology: Coralyn Helling, MD Neurology: Windell Norfolk Vascular surgery: Heath Lark, MD  LABS: Labs reviewed: Acceptable for surgery. (all labs ordered are listed, but only abnormal results are displayed)  Labs Reviewed  BASIC METABOLIC PANEL - Abnormal; Notable for the following components:      Result Value   Creatinine, Ser 1.01 (*)    All other components within normal limits  CBC - Abnormal; Notable for the following components:   RBC 3.53 (*)    Hemoglobin 10.7 (*)    HCT 35.1 (*)    All other components within normal limits     IMAGES:  CT Abdomen/Pelvis 12/15/22:  IMPRESSION: 1. 8 mm calculus in the left renal pelvis with no evidence of obstruction. Urothelial thickening is noted at the left renal pelvis and proximal left ureter which may be infectious or inflammatory. 2. Nonobstructive left renal calculus. 3. Scattered airspace opacities at the lung bases, improved from the previous exam and may be infectious or inflammatory. 4. Aortic atherosclerosis.  CXR 09/26/22:  IMPRESSION: Chronic interstitial changes noted  bilaterally. Previously identified left upper lobe infiltrate has almost completely resolved.    EKG 12/23/22:  NSR, rate 90 Low voltage QRS Nonspecific ST and T wave changes No significant change from prior   CV:  NM Stress Test 12/25/22:    The study is normal. The study is low risk.   No ST deviation was noted.   LV perfusion is normal. There is no evidence of ischemia. There is no evidence of infarction.   Left ventricular function is normal. Nuclear stress EF: 74 %. The left ventricular ejection fraction is hyperdynamic (>65%). End diastolic cavity size is normal. End systolic cavity size is normal.   Prior study not available for comparison.   Normal resting and stress perfusion. No ischemia or infarction EF 74%  Echo 09/03/21:  IMPRESSIONS     1. Left ventricular ejection fraction, by estimation, is 60 to 65%. The  left ventricle has normal function. The left ventricle has no regional  wall motion abnormalities. Left ventricular diastolic parameters are  consistent with Grade I diastolic  dysfunction (impaired relaxation). The average left ventricular global  longitudinal strain is -26.9 %. The global longitudinal strain is normal.   2. Right ventricular systolic function is normal. The right ventricular  size is normal.   3. The mitral valve is normal in structure. No evidence of mitral valve  regurgitation. No evidence of mitral stenosis.   4. The aortic valve is normal in structure. Aortic valve regurgitation is  not visualized. No aortic stenosis is present.   5. The inferior vena cava is normal in size with greater than 50%  respiratory variability, suggesting right atrial pressure of 3 mmHg.   Comparison(s): 07/15/16 EF 55-60%. PA pressure .    Past Medical History:  Diagnosis Date   Allergic rhinitis    Anxiety    Arthritis    "hands and legs and feet" (04/27/2012)   Asthma    Cancer (HCC) 2005   rt arm mole rem cancer   CHF (congestive heart  failure) (HCC)    Chronic bronchitis (HCC)    "keep it all the time" (04/27/2012)   Chronic kidney disease    Chronic kidney disease (CKD)    Stage III   COPD (chronic obstructive pulmonary disease) (HCC)    COVID-19 virus infection    03-2021   Depression    PT DENIES   Diverticulosis    DJD (degenerative joint disease)    Emphysema    Epilepsy (HCC)    Epilepsy (HCC)    Esophageal stricture    Exertional dyspnea    GERD (gastroesophageal reflux disease)    Glaucoma    H/O blood clots    Right leg   Heart failure (HCC)    History of COVID-19 03/2021   History of kidney stones    History of nuclear stress test    Myoview 12/25/22: EF 74, no ischemia or infarction, low risk   Hyperlipidemia    Hypertension    IBS (irritable bowel syndrome)    Migraine    Neck pain  Cervical disc   OSA on CPAP    6-7 yrs; pt wears CPAP but has not been able to use it lately due to sinus issues   Oxygen deficiency    USES 3 LITERS 02 CONTINOUS   Pneumonia 2012; 04/27/2012   Recurrent upper respiratory infection (URI)    Right leg DVT (HCC) 1982   groin   Seizures (HCC)    LAST SEIZURE 6 MONTHS OR  LONGER   Sleep apnea    Type II diabetes mellitus (HCC)    No meds since weight loss   UTI (urinary tract infection)    2 weeks ago   Vertigo 10/24/2014    Past Surgical History:  Procedure Laterality Date   ABDOMINAL HYSTERECTOMY  2001   "partial"   BIOPSY  04/16/2021   Procedure: BIOPSY;  Surgeon: Napoleon Form, MD;  Location: [facility];  Service: Endoscopy;;   CARPAL TUNNEL RELEASE  ~ 2008   right   CARPAL TUNNEL RELEASE Right    CATARACT EXTRACTION Bilateral    COLONOSCOPY     COLONOSCOPY WITH ESOPHAGOGASTRODUODENOSCOPY (EGD)     ESOPHAGOGASTRODUODENOSCOPY (EGD) WITH PROPOFOL N/A 04/16/2021   Procedure: ESOPHAGOGASTRODUODENOSCOPY (EGD) WITH PROPOFOL;  Surgeon: Napoleon Form, MD;  Location: [facility];  Service: Endoscopy;  Laterality: N/A;   KNEE ARTHROSCOPY   1990's   left   NASAL SEPTOPLASTY W/ TURBINOPLASTY Bilateral 05/23/2016   Procedure: NASAL SEPTOPLASTY WITH TURBINATE REDUCTION;  Surgeon: Osborn Coho, MD;  Location: [facility] OR;  Service: ENT;  Laterality: Bilateral;   SHOULDER OPEN ROTATOR CUFF REPAIR  ~ 2010   left   SINOSCOPY     SINUS ENDO WITH FUSION Right 05/23/2016   Procedure: LIMITED RIGHT ENDOSCOPIC SINUS SURGERY;  Surgeon: Osborn Coho, MD;  Location: [facility] OR;  Service: ENT;  Laterality: Right;   TOTAL KNEE ARTHROPLASTY  08/23/2012   right   TOTAL KNEE ARTHROPLASTY  08/23/2012   Procedure: TOTAL KNEE ARTHROPLASTY;  Surgeon: Nilda Simmer, MD;  Location: [facility] OR;  Service: Orthopedics;  Laterality: Right;  RIGHT KNEE ARTHROPLASTY MEDIAL AND LATERAL COMPARTMENTS WITH PATELLA RESURFACING   TUBAL LIGATION  2001   UPPER GASTROINTESTINAL ENDOSCOPY      MEDICATIONS:  albuterol (PROAIR HFA) 108 (90 Base) MCG/ACT inhaler   albuterol (PROVENTIL) (2.5 MG/3ML) 0.083% nebulizer solution   allopurinol (ZYLOPRIM) 100 MG tablet   ALPRAZolam (XANAX) 0.25 MG tablet   Azelastine HCl (ASTEPRO) 0.15 % SOLN   cephALEXin (KEFLEX) 500 MG capsule   Cholecalciferol (VITAMIN D3) 50 MCG (2000 UT) capsule   famotidine (PEPCID) 40 MG tablet   ferrous sulfate 324 (65 Fe) MG TBEC   gabapentin (NEURONTIN) 100 MG capsule   guaiFENesin (MUCINEX) 600 MG 12 hr tablet   ipratropium-albuterol (DUONEB) 0.5-2.5 (3) MG/3ML SOLN   lamoTRIgine (LAMICTAL) 200 MG tablet   loratadine (ALLERGY RELIEF) 10 MG tablet   montelukast (SINGULAIR) 10 MG tablet   oxyCODONE-acetaminophen (PERCOCET/ROXICET) 5-325 MG tablet   OXYGEN   pantoprazole (PROTONIX) 40 MG tablet   QUEtiapine (SEROQUEL) 100 MG tablet   simvastatin (ZOCOR) 40 MG tablet   tiZANidine (ZANAFLEX) 4 MG tablet   topiramate (TOPAMAX) 100 MG tablet   TRELEGY ELLIPTA 100-62.5-25 MCG/ACT AEPB   triamcinolone cream (KENALOG) 0.1 %   XARELTO 20 MG TABS tablet   No current facility-administered medications  for this encounter.   Marcille Blanco [facility] Short Stay/Anesthesiology [facility] Phone [phone] 01/23/2023 10:35 AM

## 2023-01-26 MED ORDER — GENTAMICIN SULFATE 40 MG/ML IJ SOLN
5.0000 mg/kg | INTRAVENOUS | Status: AC
  Administered 2023-01-27: 292 mg via INTRAVENOUS
  Filled 2023-01-26: qty 7.25

## 2023-01-27 ENCOUNTER — Ambulatory Visit (HOSPITAL_COMMUNITY)
Admission: RE | Admit: 2023-01-27 | Discharge: 2023-01-27 | Disposition: A | Discharge status: Home / Self Care | Payer: 59 | Attending: Urology | Admitting: Urology

## 2023-01-27 ENCOUNTER — Encounter (HOSPITAL_COMMUNITY): Payer: Self-pay | Admitting: Urology

## 2023-01-27 ENCOUNTER — Ambulatory Visit (HOSPITAL_COMMUNITY): Payer: 59

## 2023-01-27 ENCOUNTER — Ambulatory Visit (HOSPITAL_COMMUNITY): Payer: 59 | Admitting: Medical

## 2023-01-27 ENCOUNTER — Encounter (HOSPITAL_COMMUNITY)
Admission: RE | Disposition: A | Discharge status: Home / Self Care | Payer: Self-pay | Source: Home / Self Care | Attending: Urology

## 2023-01-27 ENCOUNTER — Ambulatory Visit (HOSPITAL_BASED_OUTPATIENT_CLINIC_OR_DEPARTMENT_OTHER): Payer: 59 | Admitting: Anesthesiology

## 2023-01-27 DIAGNOSIS — E1122 Type 2 diabetes mellitus with diabetic chronic kidney disease: Secondary | ICD-10-CM

## 2023-01-27 DIAGNOSIS — Z01818 Encounter for other preprocedural examination: Secondary | ICD-10-CM

## 2023-01-27 DIAGNOSIS — N3946 Mixed incontinence: Secondary | ICD-10-CM | POA: Insufficient documentation

## 2023-01-27 DIAGNOSIS — G4730 Sleep apnea, unspecified: Secondary | ICD-10-CM | POA: Diagnosis not present

## 2023-01-27 DIAGNOSIS — N189 Chronic kidney disease, unspecified: Secondary | ICD-10-CM

## 2023-01-27 DIAGNOSIS — K219 Gastro-esophageal reflux disease without esophagitis: Secondary | ICD-10-CM | POA: Insufficient documentation

## 2023-01-27 DIAGNOSIS — Z86718 Personal history of other venous thrombosis and embolism: Secondary | ICD-10-CM | POA: Diagnosis not present

## 2023-01-27 DIAGNOSIS — N200 Calculus of kidney: Secondary | ICD-10-CM | POA: Insufficient documentation

## 2023-01-27 DIAGNOSIS — Z6830 Body mass index (BMI) 30.0-30.9, adult: Secondary | ICD-10-CM | POA: Diagnosis not present

## 2023-01-27 DIAGNOSIS — Z9981 Dependence on supplemental oxygen: Secondary | ICD-10-CM | POA: Diagnosis not present

## 2023-01-27 DIAGNOSIS — Z87440 Personal history of urinary (tract) infections: Secondary | ICD-10-CM | POA: Insufficient documentation

## 2023-01-27 DIAGNOSIS — I509 Heart failure, unspecified: Secondary | ICD-10-CM | POA: Insufficient documentation

## 2023-01-27 DIAGNOSIS — E669 Obesity, unspecified: Secondary | ICD-10-CM | POA: Diagnosis not present

## 2023-01-27 DIAGNOSIS — F17210 Nicotine dependence, cigarettes, uncomplicated: Secondary | ICD-10-CM | POA: Insufficient documentation

## 2023-01-27 DIAGNOSIS — D631 Anemia in chronic kidney disease: Secondary | ICD-10-CM

## 2023-01-27 DIAGNOSIS — I130 Hypertensive heart and chronic kidney disease with heart failure and stage 1 through stage 4 chronic kidney disease, or unspecified chronic kidney disease: Secondary | ICD-10-CM

## 2023-01-27 DIAGNOSIS — J449 Chronic obstructive pulmonary disease, unspecified: Secondary | ICD-10-CM | POA: Insufficient documentation

## 2023-01-27 DIAGNOSIS — R569 Unspecified convulsions: Secondary | ICD-10-CM | POA: Insufficient documentation

## 2023-01-27 DIAGNOSIS — R310 Gross hematuria: Secondary | ICD-10-CM

## 2023-01-27 DIAGNOSIS — R1032 Left lower quadrant pain: Secondary | ICD-10-CM

## 2023-01-27 DIAGNOSIS — F17200 Nicotine dependence, unspecified, uncomplicated: Secondary | ICD-10-CM

## 2023-01-27 DIAGNOSIS — Z9189 Other specified personal risk factors, not elsewhere classified: Secondary | ICD-10-CM

## 2023-01-27 HISTORY — PX: CYSTOSCOPY/URETEROSCOPY/HOLMIUM LASER/STENT PLACEMENT: SHX6546

## 2023-01-27 LAB — GLUCOSE, CAPILLARY
Glucose-Capillary: 101 mg/dL — ABNORMAL HIGH (ref 70–99)
Glucose-Capillary: 100 mg/dL — ABNORMAL HIGH (ref 70–99)

## 2023-01-27 SURGERY — CYSTOSCOPY/URETEROSCOPY/HOLMIUM LASER/STENT PLACEMENT
Anesthesia: General | Site: Ureter | Laterality: Left

## 2023-01-27 MED ORDER — 0.9 % SODIUM CHLORIDE (POUR BTL) OPTIME
TOPICAL | Status: DC | PRN
  Administered 2023-01-27: 1000 mL

## 2023-01-27 MED ORDER — OXYCODONE HCL 5 MG/5ML PO SOLN: 5.0000 mg | Freq: Once | ORAL | Status: DC | PRN

## 2023-01-27 MED ORDER — OXYCODONE-ACETAMINOPHEN 5-325 MG PO TABS
1.0000 | ORAL_TABLET | Freq: Four times a day (QID) | ORAL | 0 refills | Status: DC | PRN
Start: 2023-01-27 — End: 2024-01-12

## 2023-01-27 MED ORDER — CEPHALEXIN 500 MG PO CAPS: 500.0000 mg | ORAL_CAPSULE | Freq: Four times a day (QID) | ORAL | 0 refills | Status: DC

## 2023-01-27 MED ORDER — ORAL CARE MOUTH RINSE: 15.0000 mL | Freq: Once | OROMUCOSAL | Status: DC

## 2023-01-27 MED ORDER — LACTATED RINGERS IV SOLN: INTRAVENOUS | Status: DC

## 2023-01-27 MED ORDER — MIDAZOLAM HCL 2 MG/2ML IJ SOLN
INTRAMUSCULAR | Status: AC
  Filled 2023-01-27: qty 2

## 2023-01-27 MED ORDER — FENTANYL CITRATE (PF) 100 MCG/2ML IJ SOLN
INTRAMUSCULAR | Status: AC
  Filled 2023-01-27: qty 2

## 2023-01-27 MED ORDER — ONDANSETRON HCL 4 MG/2ML IJ SOLN: 4.0000 mg | Freq: Once | INTRAMUSCULAR | Status: DC | PRN

## 2023-01-27 MED ORDER — CHLORHEXIDINE GLUCONATE 0.12 % MT SOLN
15.0000 mL | Freq: Once | OROMUCOSAL | Status: AC
  Administered 2023-01-27: 15 mL via OROMUCOSAL

## 2023-01-27 MED ORDER — ORAL CARE MOUTH RINSE: 15.0000 mL | Freq: Once | OROMUCOSAL | Status: AC

## 2023-01-27 MED ORDER — ONDANSETRON HCL 4 MG/2ML IJ SOLN
INTRAMUSCULAR | Status: DC | PRN
  Administered 2023-01-27: 4 mg via INTRAVENOUS

## 2023-01-27 MED ORDER — SODIUM CHLORIDE 0.9 % IR SOLN
Status: DC | PRN
  Administered 2023-01-27: 3000 mL

## 2023-01-27 MED ORDER — LIDOCAINE 2% (20 MG/ML) 5 ML SYRINGE
INTRAMUSCULAR | Status: DC | PRN
  Administered 2023-01-27: 60 mg via INTRAVENOUS

## 2023-01-27 MED ORDER — DEXAMETHASONE SODIUM PHOSPHATE 10 MG/ML IJ SOLN
INTRAMUSCULAR | Status: DC | PRN
  Administered 2023-01-27: 5 mg via INTRAVENOUS

## 2023-01-27 MED ORDER — SODIUM CHLORIDE 0.9% FLUSH: 3.0000 mL | Freq: Two times a day (BID) | INTRAVENOUS | Status: DC

## 2023-01-27 MED ORDER — FENTANYL CITRATE PF 50 MCG/ML IJ SOSY: 25.0000 ug | PREFILLED_SYRINGE | INTRAMUSCULAR | Status: DC | PRN

## 2023-01-27 MED ORDER — LIDOCAINE HCL (PF) 2 % IJ SOLN
INTRAMUSCULAR | Status: AC
  Filled 2023-01-27: qty 5

## 2023-01-27 MED ORDER — ALBUTEROL SULFATE HFA 108 (90 BASE) MCG/ACT IN AERS
INHALATION_SPRAY | RESPIRATORY_TRACT | Status: AC
  Filled 2023-01-27: qty 6.7

## 2023-01-27 MED ORDER — PROPOFOL 10 MG/ML IV BOLUS
INTRAVENOUS | Status: DC | PRN
  Administered 2023-01-27: 100 mg via INTRAVENOUS

## 2023-01-27 MED ORDER — PHENYLEPHRINE 80 MCG/ML (10ML) SYRINGE FOR IV PUSH (FOR BLOOD PRESSURE SUPPORT)
PREFILLED_SYRINGE | INTRAVENOUS | Status: AC
  Filled 2023-01-27: qty 10

## 2023-01-27 MED ORDER — OXYCODONE HCL 5 MG PO TABS: 5.0000 mg | ORAL_TABLET | Freq: Once | ORAL | Status: DC | PRN

## 2023-01-27 MED ORDER — MIDAZOLAM HCL 5 MG/5ML IJ SOLN
INTRAMUSCULAR | Status: DC | PRN
  Administered 2023-01-27: 1 mg via INTRAVENOUS

## 2023-01-27 MED ORDER — CHLORHEXIDINE GLUCONATE 0.12 % MT SOLN: 15.0000 mL | Freq: Once | OROMUCOSAL | Status: DC

## 2023-01-27 MED ORDER — IOHEXOL 300 MG/ML  SOLN
INTRAMUSCULAR | Status: DC | PRN
  Administered 2023-01-27: 2 mL

## 2023-01-27 MED ORDER — PHENYLEPHRINE 80 MCG/ML (10ML) SYRINGE FOR IV PUSH (FOR BLOOD PRESSURE SUPPORT)
PREFILLED_SYRINGE | INTRAVENOUS | Status: DC | PRN
  Administered 2023-01-27 (×4): 80 ug via INTRAVENOUS

## 2023-01-27 MED ORDER — ONDANSETRON HCL 4 MG/2ML IJ SOLN
INTRAMUSCULAR | Status: AC
  Filled 2023-01-27: qty 2

## 2023-01-27 MED ORDER — PROPOFOL 10 MG/ML IV BOLUS
INTRAVENOUS | Status: AC
  Filled 2023-01-27: qty 20

## 2023-01-27 SURGICAL SUPPLY — 23 items
BAG URO CATCHER STRL LF (MISCELLANEOUS) ×1 IMPLANT
BASKET STONE NCOMPASS (UROLOGICAL SUPPLIES) IMPLANT
CATH URETERAL DUAL LUMEN 10F (MISCELLANEOUS) IMPLANT
CATH URETL OPEN 5X70 (CATHETERS) IMPLANT
CLOTH BEACON ORANGE TIMEOUT ST (SAFETY) ×1 IMPLANT
EXTRACTOR STONE NITINOL NGAGE (UROLOGICAL SUPPLIES) IMPLANT
GLOVE SURG SS PI 8.0 STRL IVOR (GLOVE) ×1 IMPLANT
GOWN STRL REUS W/ TWL XL LVL3 (GOWN DISPOSABLE) ×1 IMPLANT
GOWN STRL REUS W/TWL XL LVL3 (GOWN DISPOSABLE) ×1
GUIDEWIRE STR DUAL SENSOR (WIRE) ×1 IMPLANT
IV NS IRRIG 3000ML ARTHROMATIC (IV SOLUTION) ×1 IMPLANT
KIT TURNOVER KIT A (KITS) IMPLANT
LASER FIB FLEXIVA PULSE ID 365 (Laser) IMPLANT
LASER FIB FLEXIVA PULSE ID 550 (Laser) IMPLANT
LASER FIB FLEXIVA PULSE ID 910 (Laser) IMPLANT
MANIFOLD NEPTUNE II (INSTRUMENTS) ×1 IMPLANT
PACK CYSTO (CUSTOM PROCEDURE TRAY) ×1 IMPLANT
SHEATH NAVIGATOR HD 11/13X36 (SHEATH) IMPLANT
STENT URET 6FRX22 CONTOUR (STENTS) IMPLANT
TRACTIP FLEXIVA PULS ID 200XHI (Laser) IMPLANT
TRACTIP FLEXIVA PULSE ID 200 (Laser) ×1
TUBING CONNECTING 10 (TUBING) ×1 IMPLANT
TUBING UROLOGY SET (TUBING) ×1 IMPLANT

## 2023-01-27 NOTE — Transfer of Care (Signed)
Immediate Anesthesia Transfer of Care Note  Patient: [name]  Procedure(s) Performed: CYSTOSCOPY LEFT RETROGRADE PYELOGRAM, LEFT URETEROSCOPY, HOLMIUM LASER LITHOTRIPSY, AND LEFT URETERAL STENT PLACEMENT (Left: Ureter)  Patient Location: PACU  Anesthesia Type:General  Level of Consciousness: drowsy  Airway & Oxygen Therapy: Patient Spontanous Breathing and Patient connected to nasal cannula oxygen  Post-op Assessment: Report given to RN and Post -op Vital signs reviewed and stable  Post vital signs: Reviewed and stable  Last Vitals:  Vitals Value Taken Time  BP 127/76 01/27/23 1408  Temp    Pulse 91 01/27/23 1410  Resp 12 01/27/23 1410  SpO2 100 % 01/27/23 1410  Vitals shown include unvalidated device data.  Last Pain:  Vitals:   01/27/23 1126  TempSrc:   PainSc: 0-No pain      Patients Stated Pain Goal: 3 (01/27/23 1126)  Complications: No notable events documented.

## 2023-01-27 NOTE — Op Note (Signed)
Procedure: 1.  Cystoscopy with left retrograde pyelogram and interpretation. 2.  Left ureteroscopy with holmium laser application, stone extraction and insertion of left double-J stent. 3.  Application of fluoroscopy.  Preop diagnosis left renal pelvic stone with history of recurrent UTIs.  Postop diagnosis: Same.  Surgeon: Dr. Bjorn Pippin.  Anesthesia: General.  Drain: 6 French by 22 cm left contour double-J stent with tether.  Specimen: Stone fragments.  EBL: None.  Complications: None.  Indications: Patient is a 67 year old female with a history recurrent urinary tract infections who was found to have an 8 mm renal pelvic stone on the left that was felt to possibly be contributing to recurrent infections and was felt ureteroscopy was indicated.  Procedure: She was taken the operating room where she was given antibiotics.  A general anesthetic was induced.  She was placed in lithotomy position and fitted with PAS hose.  Her perineum and genitalia were prepped Betadine solution she was draped in usual sterile fashion.  Cystoscopy was performed using the 21 [place] scope and 30 degree lens.  Examination revealed a normal urethra.  She did have a deep bowel spot with a cystocele.  Ureteral orifices were unremarkable.  The bladder wall was smooth and pale without tumors, stones or inflammation.  The left ureteral orifice was cannulated with 5 [place] open-ended catheter and Omnipaque was instilled for retrograde pyelogram.  The retrograde pyelogram demonstrated normal caliber ureter to the kidney.  There was a filling defect in the nondilated collecting system consistent with her stone in the renal pelvis.  No other filling defects were identified.  A sensor wire was advanced the kidney through the open-ended catheter and the cystoscope and open-ended cath were removed.  A 36 cm 11 French digital access sheath inner core was then easily passed over the wire to the kidney.  This was followed  by the assembled sheath.  The inner core and wire were then removed.  The dual-lumen digital flexible scope was advanced kidney without difficulty.  The stone was visualized in the renal pelvis and was engaged with a 242 m tract tip laser fiber with the Moses laser set on the dusting setting with 0.3 J and 63 Hz on the left pedal and 8 J and 10 Hz on the right pedal.  The stone broke readily and fell into a midpole calyx.  An engage basket was then used to remove all significant fragments leaving only grit and sand.  Once final inspection revealed no significant residual stones, the ureteroscope was removed and a sensor wire was advanced back to the kidney through the sheath which was then removed.  A 6 French by 22 cm contour double-J stent with tether was then advanced the kidney under fluoroscopic guidance.  The wire was removed, a good coil in the kidney.  The cystoscope was then reinserted and the stent was pulled distally until there was a appropriate loop in the bladder.  The bladder was drained and the cystoscope was removed.  The stent string was tied close to the urethral meatus, trimmed to an appropriate length and type vaginally.  She was taken down from lithotomy position, her anesthetic was reversed and she was moved recovery in stable condition.  There were no complications.  Her stone fragments were given to her family to bring the office for analysis.

## 2023-01-27 NOTE — Anesthesia Postprocedure Evaluation (Signed)
Anesthesia Post Note  Patient: [name]  Procedure(s) Performed: CYSTOSCOPY LEFT RETROGRADE PYELOGRAM, LEFT URETEROSCOPY, HOLMIUM LASER LITHOTRIPSY, AND LEFT URETERAL STENT PLACEMENT (Left: Ureter)     Patient location during evaluation: PACU Anesthesia Type: General Level of consciousness: awake and alert Pain management: pain level controlled Vital Signs Assessment: post-procedure vital signs reviewed and stable Respiratory status: spontaneous breathing, nonlabored ventilation, respiratory function stable and patient connected to nasal cannula oxygen Cardiovascular status: blood pressure returned to baseline and stable Postop Assessment: no apparent nausea or vomiting Anesthetic complications: no   No notable events documented.  Last Vitals:  Vitals:   01/27/23 1438 01/27/23 1455  BP: 122/74 131/80  Pulse: 87 83  Resp: 13 16  Temp:    SpO2: 98% 92%    Last Pain:  Vitals:   01/27/23 1438  TempSrc:   PainSc: 0-No pain                 Beryle Lathe

## 2023-01-27 NOTE — Interval H&P Note (Signed)
History and Physical Interval Note:  01/27/2023 12:44 PM  [name]  has presented today for surgery, with the diagnosis of LEFT RENAL CALCULUS.  The various methods of treatment have been discussed with the patient and family. After consideration of risks, benefits and other options for treatment, the patient has consented to  Procedure(s) with comments: CYSTOSCOPY LEFT RETROGRADE PYELOGRAM, LEFT URETEROSCOPY, HOLMIUM LASER LITHOTRIPSY, AND LEFT URETERAL STENT PLACEMENT (Left) - 60 MINUTES as a surgical intervention.  The patient's history has been reviewed, patient examined, no change in status, stable for surgery.  I have reviewed the patient's chart and labs.  Questions were answered to the patient's satisfaction.     Bjorn Pippin

## 2023-01-27 NOTE — Anesthesia Procedure Notes (Signed)
Procedure Name: LMA Insertion Date/Time: 01/27/2023 1:25 PM  Performed by: Marny Lowenstein, CRNAPre-anesthesia Checklist: Patient identified, Emergency Drugs available, Suction available and Patient being monitored Patient Re-evaluated:Patient Re-evaluated prior to induction Oxygen Delivery Method: Circle system utilized Preoxygenation: Pre-oxygenation with 100% oxygen Induction Type: IV induction Ventilation: Mask ventilation without difficulty LMA: LMA inserted LMA Size: 3.0 Number of attempts: 1 Placement Confirmation: positive ETCO2 and breath sounds checked- equal and bilateral Tube secured with: Tape Dental Injury: Teeth and Oropharynx as per pre-operative assessment

## 2023-01-27 NOTE — H&P (Signed)
I was consulted to assist the patient's patient with urinary tract infections with CNS side effects requiring hospitalization. At baseline she voids every 2 hours and has no nocturia. She has mild mixed incontinence wearing 2 pads a day. She has had uncommon episodes of bedwetting. Since her first hospitalization she is not in a wheelchair. She has home oxygen. She has a lot of leg weakness   She voids every 2 hours and has no nocturia. Has had a hysterectomy   In 2014 at her on daily Macrodantin for urinary tract infections   No other neurologic issues     ALLERGIES: Advair Diskus AEPB Amoxicillin TABS Azithromycin - rash swelling Cefdinir - swelling Clarithromycin TABS Codeine - Nausea, Vomiting diphenhydrAMINE Doxycycline - Anaphylaxis Ketorolac Tromethamine TABS - seizure Levofloxacin - Anaphylaxis Naratriptan - rash Nortriptyline HCl SOLN - Skin Rash, swelling Penicillins - Anaphylaxis Succinate - Itching Sulfa Drugs - rash Sumatriptan - mouth breaks out Triptans-5-H+1 Antimigraine Varenicline - SOB Vibramycin CAPS    MEDICATIONS: Aspirin  Simvastatin 40 mg tablet  C_PAP  Celecoxib 200 mg capsule  Cyclobenzaprine Hcl 10 mg tablet  Famotidine  Gabapentin 300 mg capsule  Lamotrigine  Loratadine 10 mg tablet  Pantoprazole Sodium 40 mg tablet, delayed release  Prednisone 5 mg tablet  Topiramate 100 mg tablet  Vitamin D3     GU PSH: Hysterectomy       PSH Notes: No Surgical Problems   NON-GU PSH: Carpal tunnel surgery, Right Rotator cuff surgery, Left Total Knee Replacement, Right     GU PMH: Acute vaginitis, Vaginitis - 2014 Chronic cystitis (w/o hematuria), Chronic cystitis - 2014 Nocturia, Nocturia - 2014 Stress Incontinence, Female stress incontinence - 2014 Weak Urinary Stream, Weak urinary stream - 2014      PMH Notes:  2012-10-21 15:58:00 - Note: Seizure epilepsy  2012-10-21 15:58:00 - Note: Arthritis   NON-GU PMH: Asthma, Asthma -  2014 Personal history of nicotine dependence, History of tobacco use - 2014 Personal history of other diseases of the digestive system, History of esophageal reflux - 2014 Personal history of other diseases of the nervous system and sense organs, History of sleep apnea - 2014, History of migraine headaches, - 2014 Personal history of other endocrine, nutritional and metabolic disease, History of hypercholesterolemia - 2014, History of diabetes mellitus, - 2014 Anxiety Arthritis Congestive heart failure DVT, History Encounter for general adult medical examination without abnormal findings, Encounter for preventive health examination GERD Hypercholesterolemia Seizure disorder Sleep Apnea    FAMILY HISTORY: Death In The Family Father - Runs In Family Death In The Family Mother - Runs In Family   SOCIAL HISTORY: Marital Status: Married Current Smoking Status: Patient smokes. Has smoked since 12/10/1971. Smokes 1/2 pack per day.   Tobacco Use Assessment Completed: Used Tobacco in last 30 days? Has never drank.  Drinks 2 caffeinated drinks per day.     Notes: Alcohol Use, Occupation:, Caffeine Use 2 sons   REVIEW OF SYSTEMS:    GU Review Female:   Patient reports frequent urination, hard to postpone urination, get up at night to urinate, leakage of urine, trouble starting your stream, and have to strain to urinate. Patient denies burning /pain with urination, stream starts and stops, and being pregnant.  Gastrointestinal (Upper):   Patient reports indigestion/ heartburn. Patient denies nausea and vomiting.  Gastrointestinal (Lower):   Patient reports constipation. Patient denies diarrhea.  Constitutional:   Patient reports weight loss. Patient denies fever, night sweats, and fatigue.  Skin:   Patient  reports itching. Patient denies skin rash/ lesion.  Eyes:   Patient denies double vision and blurred vision.  Ears/ Nose/ Throat:   Patient reports sinus problems. Patient denies sore  throat.  Hematologic/Lymphatic:   Patient reports easy bruising. Patient denies swollen glands.  Cardiovascular:   Patient reports leg swelling and chest pains.   Respiratory:   Patient reports cough and shortness of breath.   Endocrine:   Patient reports excessive thirst.   Musculoskeletal:   Patient reports back pain and joint pain.   Neurological:   Patient reports headaches and dizziness.   Psychologic:   Patient reports anxiety. Patient denies depression.   VITAL SIGNS:      12/13/2021 09:36 AM  Weight 181 lb / 82.1 kg  Height 61 in / 154.94 cm  BP 106/70 mmHg  Pulse 80 /min  Temperature 97.3 F / 36.2 C  BMI 34.2 kg/m   MULTI-SYSTEM PHYSICAL EXAMINATION:    Constitutional: Well-nourished. No physical deformities. Normally developed. Good grooming.  Neck: Neck symmetrical, not swollen. Normal tracheal position.  Respiratory: No labored breathing, no use of accessory muscles.   Cardiovascular: Normal temperature, normal extremity pulses, no swelling, no varicosities.  Lymphatic: No enlargement of neck, axillae, groin.  Skin: No paleness, no jaundice, no cyanosis. No lesion, no ulcer, no rash.  Neurologic / Psychiatric: Oriented to time, oriented to place, oriented to person. No depression, no anxiety, no agitation.  Gastrointestinal: No mass, no tenderness, no rigidity, non obese abdomen.  Eyes: Normal conjunctivae. Normal eyelids.  Ears, Nose, Mouth, and Throat: Left ear no scars, no lesions, no masses. Right ear no scars, no lesions, no masses. Nose no scars, no lesions, no masses. Normal hearing. Normal lips.  Musculoskeletal: Normal gait and station of head and neck.     PAST DATA REVIEW: None   PROCEDURES:         PVR Ultrasound - 40981  Scanned Volume: 234 cc         Urinalysis - 81003 Dipstick Dipstick Cont'd  Color: Yellow Bilirubin: Neg mg/dL  Appearance: Clear Ketones: Neg mg/dL  Specific Gravity: 1.914 Blood: Neg ery/uL  pH: 6.0 Protein: Trace mg/dL   Glucose: Neg mg/dL Urobilinogen: 0.2 mg/dL    Nitrites: Neg    Leukocyte Esterase: Neg leu/uL    ASSESSMENT:      ICD-10 Details  1 GU:   Urinary Frequency - R35.0   2   Chronic cystitis (w/o hematuria) - N30.20           Notes:   I was consulted to assist the patient's patient with urinary tract infections with CNS side effects requiring hospitalization. At baseline she voids every 2 hours and has no nocturia. She has mild mixed incontinence wearing 2 pads a day. She has had uncommon episodes of bedwetting. Since her first hospitalization she is not in a wheelchair. She has home oxygen. She has a lot of leg weakness   She voids every 2 hours and has no nocturia. Has had a hysterectomy   I had trouble reviewing the medical records in the hospital.   Because she is on home oxygen I put on trimethoprim 100 mg 3x11. She gets a rash with sulfa. Reassess in 6 weeks. Today's residual I believe was a false positive because she had not voided       PLAN:            Medications New Meds: Trimethoprim 100 mg tablet 1 tablet PO Daily   #30  11 Refill(s)  Pharmacy Name:  [id number]  Address:  [address]   [place], [place] [address]  Phone:  [phone]  Fax:  [phone]            Orders Labs Urine Culture          Schedule Return Visit/Planned Activity: 6 Weeks - Office Visit          Document Letter(s):  Created for Patient: Clinical Summary

## 2023-01-27 NOTE — Discharge Instructions (Addendum)
You may remove the stent by pulling the string from your urethra tube on Friday morning.  If you don't feel you can do that, please call the office to arrange to have it removed.  Please bring your stone fragments to the office to be sent for analysis.

## 2023-01-28 ENCOUNTER — Encounter (HOSPITAL_COMMUNITY): Payer: Self-pay | Admitting: Urology

## 2023-01-29 ENCOUNTER — Telehealth: Payer: Self-pay

## 2023-01-29 NOTE — Telephone Encounter (Signed)
Patient called in today and had a few question. Patient states she was not informed when to resume her blood thinner, Per Dr. Annabell Howells patient can resume blood thinner. Patient states she is constipated, patient was made aware to increase fluids and take over the counter Miralax, patient also states that she wanted to know when to pull her stent, patient is made aware to follow  discharged instruction on pulling her stent. Patient voiced understanding.

## 2023-02-03 NOTE — Progress Notes (Deleted)
Diagnoses: Post-operative state  HPI: [name] presents post-operatively.  Today She presents s/p the following procedures by Dr. Annabell [name] on 01/27/2023:  Preop diagnosis left renal pelvic stone with history of recurrent UTIs.   Postop diagnosis: Same.  Procedures: 1.  Cystoscopy with left retrograde pyelogram and interpretation. 2.  Left ureteroscopy with holmium laser application, stone extraction and insertion of left double-J stent. 3.  Application of fluoroscopy.  Postop course: Today She reports *** she pulled the stent out on ***.   She {Actions; denies-reports:120008} increased urinary urgency, frequency, dysuria, gross hematuria, straining to void, or sensations of incomplete emptying.  She {Actions; denies-reports:120008} flank pain.  She {Actions; denies-reports:120008} abdominal pain. She {Actions; denies-reports:120008} fevers.   Fall Screening: Do you usually have a device to assist in your mobility? {yes/no:20286} ***cane / ***walker / ***wheelchair   Medications: Current Outpatient Medications  Medication Sig Dispense Refill   albuterol (PROAIR HFA) 108 (90 Base) MCG/ACT inhaler INHALE TWO PUFFS INTO THE LUNGS EVERY 6 HOURS AS NEEDED FOR SHORTNESS OF BREATH (Patient taking differently: Inhale 2 puffs into the lungs every 6 (six) hours as needed for shortness of breath or wheezing.) 18 g 6   albuterol (PROVENTIL) (2.5 MG/3ML) 0.083% nebulizer solution USE 1 VIAL IN NEBULIZER EVERY 6 HOURS AS NEEDED FOR SHORTNESS OF BREATH (Patient taking differently: Take 2.5 mg by nebulization every 6 (six) hours as needed for wheezing or shortness of breath.) 75 mL 8   allopurinol (ZYLOPRIM) 100 MG tablet Take 100 mg by mouth daily.     ALPRAZolam (XANAX) 0.25 MG tablet Take 0.25 mg by mouth 2 (two) times daily.     Azelastine HCl (ASTEPRO) 0.15 % SOLN Place 1 spray into the nose 2 (two) times daily as needed (Allergies). 30 mL 5   cephALEXin (KEFLEX) 500 MG capsule Take 1  capsule (500 mg total) by mouth 4 (four) times daily. 12 capsule 0   Cholecalciferol (VITAMIN D3) 50 MCG (2000 UT) capsule TAKE ONE CAPSULE BY MOUTH DAILY (Patient taking differently: Take 2,000 Units by mouth daily.) 90 capsule 1   famotidine (PEPCID) 40 MG tablet Take 40 mg by mouth at bedtime.     ferrous sulfate 324 (65 Fe) MG TBEC TAKE ONE TABLET BY MOUTH EVERY OTHER DAY (Patient taking differently: Take 324 mg by mouth every other day.) 90 tablet 5   gabapentin (NEURONTIN) 100 MG capsule Take 1 capsule (100 mg total) by mouth at bedtime. (Patient taking differently: Take 100 mg by mouth 3 (three) times daily.) 90 capsule 3   guaiFENesin (MUCINEX) 600 MG 12 hr tablet Take 2 tablets (1,200 mg total) by mouth 2 (two) times daily as needed. (Patient taking differently: Take 1,200 mg by mouth 2 (two) times daily as needed for cough or to loosen phlegm.) 60 tablet 5   ipratropium-albuterol (DUONEB) 0.5-2.5 (3) MG/3ML SOLN Take 3 mLs by nebulization every 6 (six) hours as needed (Wheezing/sob).     lamoTRIgine (LAMICTAL) 200 MG tablet TAKE 1 AND 1/2 TABLETS BY MOUTH TWICE DAILY (Patient taking differently: Take 300 mg by mouth 2 (two) times daily.) 270 tablet 1   loratadine (ALLERGY RELIEF) 10 MG tablet Take 1 tablet (10 mg total) by mouth daily. 90 tablet 3   montelukast (SINGULAIR) 10 MG tablet TAKE ONE TABLET BY MOUTH AT BEDTIME 30 tablet 5   oxyCODONE-acetaminophen (PERCOCET/ROXICET) 5-325 MG tablet Take 1 tablet by mouth every 6 (six) hours as needed for severe pain. 15 tablet 0  OXYGEN Inhale 3 L into the lungs continuous.     pantoprazole (PROTONIX) 40 MG tablet Take 1 tablet (40 mg total) by mouth 2 (two) times daily. **PLEASE CALL OFFICE TO SCHEDULE FOLLOW UP 60 tablet 0   QUEtiapine (SEROQUEL) 100 MG tablet TAKE ONE TABLET BY MOUTH AT BEDTIME (Patient taking differently: Take 100 mg by mouth at bedtime.) 30 tablet 5   simvastatin (ZOCOR) 40 MG tablet TAKE ONE TABLET BY MOUTH AT BEDTIME  (Patient taking differently: Take 40 mg by mouth daily at 6 PM.) 90 tablet 2   tiZANidine (ZANAFLEX) 4 MG tablet TAKE ONE TABLET BY MOUTH EVERY 6 HOURS AS NEEDED FOR MUSCLE SPASMS FOR UP TO 30 DOSES (Patient not taking: Reported on 01/22/2023) 15 tablet 1   topiramate (TOPAMAX) 100 MG tablet Take 200 mg twice daily (Patient taking differently: Take 200 mg by mouth 2 (two) times daily.) 360 tablet 2   TRELEGY ELLIPTA 100-62.5-25 MCG/ACT AEPB INHALE 1 PUFF BY MOUTH INTO THE LUNGS DAILY IN THE AFTERNOON (Patient taking differently: Inhale 1 puff into the lungs daily.) 60 each 5   triamcinolone cream (KENALOG) 0.1 % Apply 1 Application topically daily as needed (irritation).     XARELTO 20 MG TABS tablet Take 20 mg by mouth daily.     No current facility-administered medications for this visit.    Allergies: Allergies  Allergen Reactions   Amoxicillin Anaphylaxis    Breakout, mouth swells   Azithromycin Swelling and Rash    "swelling all over, hives, thrush"   Cefdinir     Throat swelling after 3rd dose   Chantix [Varenicline] Other (See Comments)    "bad dreams, difficulty breathing"   Doxycycline Hyclate Anaphylaxis and Rash    swelling   Levofloxacin Anaphylaxis    Pt can take moxifloxacin   Penicillins Anaphylaxis, Shortness Of Breath and Rash    "ALMOST DIED, ended up in hospital"    Sulfonamide Derivatives Shortness Of Breath, Swelling and Rash    "break out from head to toe"   Toradol [Ketorolac Tromethamine] Other (See Comments)    Seizure    Clarithromycin Hives    swelling   Nortriptyline Swelling, Rash and Other (See Comments)    Per patient had "kidney problems" on this medication, could not use bathroom (urinary retention)   Paxlovid [Nirmatrelvir-Ritonavir] Swelling    Throat swelling   Codeine Nausea And Vomiting   Fluticasone-Salmeterol Other (See Comments)    REACTION: ulcers in mouth   Triptans Rash    Mouth breaks out and start itching    Past Medical  History:  Diagnosis Date   Allergic rhinitis    Anxiety    Arthritis    "hands and legs and feet" (04/27/2012)   Asthma    Cancer (HCC) 2005   rt arm mole rem cancer   CHF (congestive heart failure) (HCC)    Chronic bronchitis (HCC)    "keep it all the time" (04/27/2012)   Chronic kidney disease    Chronic kidney disease (CKD)    Stage III   COPD (chronic obstructive pulmonary disease) (HCC)    COVID-19 virus infection    03-2021   Depression    PT DENIES   Diverticulosis    DJD (degenerative joint disease)    Emphysema    Epilepsy (HCC)    Epilepsy (HCC)    Esophageal stricture    Exertional dyspnea    GERD (gastroesophageal reflux disease)    Glaucoma    H/O blood clots  Right leg   Heart failure (HCC)    History of COVID-19 03/2021   History of kidney stones    History of nuclear stress test    Myoview 12/25/22: EF 74, no ischemia or infarction, low risk   Hyperlipidemia    Hypertension    IBS (irritable bowel syndrome)    Migraine    Neck pain    Cervical disc   OSA on CPAP    6-7 yrs; pt wears CPAP but has not been able to use it lately due to sinus issues   Oxygen deficiency    USES 3 LITERS 02 CONTINOUS   Pneumonia 2012; 04/27/2012   Recurrent upper respiratory infection (URI)    Right leg DVT (HCC) 1982   groin   Seizures (HCC)    LAST SEIZURE 6 MONTHS OR  LONGER   Sleep apnea    Type II diabetes mellitus (HCC)    No meds since weight loss   UTI (urinary tract infection)    2 weeks ago   Vertigo 10/24/2014   Past Surgical History:  Procedure Laterality Date   ABDOMINAL HYSTERECTOMY  2001   "partial"   BIOPSY  04/16/2021   Procedure: BIOPSY;  Surgeon: Napoleon Form, MD;  Location: [facility];  Service: Endoscopy;;   CARPAL TUNNEL RELEASE  ~ 2008   right   CARPAL TUNNEL RELEASE Right    CATARACT EXTRACTION Bilateral    COLONOSCOPY     COLONOSCOPY WITH ESOPHAGOGASTRODUODENOSCOPY (EGD)     CYSTOSCOPY/URETEROSCOPY/HOLMIUM LASER/STENT  PLACEMENT Left 01/27/2023   Procedure: CYSTOSCOPY LEFT RETROGRADE PYELOGRAM, LEFT URETEROSCOPY, HOLMIUM LASER LITHOTRIPSY, AND LEFT URETERAL STENT PLACEMENT;  Surgeon: Bjorn Pippin, MD;  Location: [facility];  Service: Urology;  Laterality: Left;  60 MINUTES   ESOPHAGOGASTRODUODENOSCOPY (EGD) WITH PROPOFOL N/A 04/16/2021   Procedure: ESOPHAGOGASTRODUODENOSCOPY (EGD) WITH PROPOFOL;  Surgeon: Napoleon Form, MD;  Location: [facility];  Service: Endoscopy;  Laterality: N/A;   KNEE ARTHROSCOPY  1990's   left   NASAL SEPTOPLASTY W/ TURBINOPLASTY Bilateral 05/23/2016   Procedure: NASAL SEPTOPLASTY WITH TURBINATE REDUCTION;  Surgeon: Osborn Coho, MD;  Location: [facility];  Service: ENT;  Laterality: Bilateral;   SHOULDER OPEN ROTATOR CUFF REPAIR  ~ 2010   left   SINOSCOPY     SINUS ENDO WITH FUSION Right 05/23/2016   Procedure: LIMITED RIGHT ENDOSCOPIC SINUS SURGERY;  Surgeon: Osborn Coho, MD;  Location[facility];  Service: ENT;  Laterality: Right;   TOTAL KNEE ARTHROPLASTY  08/23/2012   right   TOTAL KNEE ARTHROPLASTY  08/23/2012   Procedure: TOTAL KNEE ARTHROPLASTY;  Surgeon: Nilda Simmer, MD;  Location: [facility] OR;  Service: Orthopedics;  Laterality: Right;  RIGHT KNEE ARTHROPLASTY MEDIAL AND LATERAL COMPARTMENTS WITH PATELLA RESURFACING   TUBAL LIGATION  2001   UPPER GASTROINTESTINAL ENDOSCOPY     Family History  Problem Relation Age of Onset   Diabetes Mother    Lung cancer Mother        was a smoker   Stroke Mother    Alcohol abuse Father    Lung cancer Father        was a smoker   Emphysema Father        was a smoker   Heart disease Sister    Other Sister        blood clotting disorder   Diabetes Brother    Seizures Brother    Diabetes Maternal Aunt    Breast cancer Maternal Aunt    Esophageal cancer  Maternal Uncle    Diabetes Maternal Uncle    Heart disease Maternal Grandfather    Diabetes Maternal Grandfather    Bipolar disorder Son    Muscular dystrophy Son    Heart  disease Son    Heart disease Other        uncle   Colon cancer Neg Hx    Pancreatic cancer Neg Hx    Stomach cancer Neg Hx    Colon polyps Neg Hx    Rectal cancer Neg Hx    Social History   Socioeconomic History   Marital status: Married    Spouse name: Westly Pam   Number of children: 2   Years of education: 12   Highest education level: Not on file  Occupational History   Occupation: disabled  Tobacco Use   Smoking status: Every Day    Packs/day: 0.50    Years: 40.00    Additional pack years: 0.00    Total pack years: 20.00    Types: Cigarettes    Start date: 08/12/1975    Last attempt to quit: 12/30/2020    Years since quitting: 2.0   Smokeless tobacco: Never   Tobacco comments:    Smoking 6 per day.  Trying to quit.  01/13/2023  Vaping Use   Vaping Use: Never used  Substance and Sexual Activity   Alcohol use: No    Alcohol/week: 0.0 standard drinks of alcohol   Drug use: No   Sexual activity: Never  Other Topics Concern   Not on file  Social History Narrative   Patient lives at home with husband Westly Pam, her son and his wife.    Patient has 2 children.    Patient has a high school education.    Patient is currently unemployed.    Patient is right handed.    Patient drinks 2 cups of caffeine per day.   Social Determinants of Health   Financial Resource Strain: High Risk (09/13/2021)   Overall Financial Resource Strain (CARDIA)    Difficulty of Paying Living Expenses: Very hard  Food Insecurity: Food Insecurity Present (09/13/2021)   Hunger Vital Sign    Worried About Running Out of Food in the Last Year: Often true    Ran Out of Food in the Last Year: Often true  Transportation Needs: No Transportation Needs (09/13/2021)   PRAPARE - Administrator, Civil Service (Medical): No    Lack of Transportation (Non-Medical): No  Physical Activity: Inactive (08/13/2021)   Exercise Vital Sign    Days of Exercise per Week: 0 days    Minutes of Exercise per Session: 0 min   Stress: Not on file  Social Connections: Moderately Integrated (08/13/2021)   Social Connection and Isolation Panel [NHANES]    Frequency of Communication with Friends and Family: More than three times a week    Frequency of Social Gatherings with Friends and Family: More than three times a week    Attends Religious Services: Never    Database administrator or Organizations: Yes    Attends Banker Meetings: Never    Marital Status: Married  Catering manager Violence: Not on file    SUBJECTIVE  Review of Systems Constitutional: Patient ***denies any unintentional weight loss or change in strength lntegumentary: Patient ***denies any rashes or pruritus Eyes: Patient denies ***dry eyes ENT: Patient ***denies dry mouth Cardiovascular: Patient ***denies chest pain or syncope Respiratory: Patient ***denies shortness of breath Gastrointestinal: Patient ***denies nausea, vomiting, constipation, or diarrhea  Musculoskeletal: Patient ***denies muscle cramps or weakness Neurologic: Patient ***denies convulsions or seizures Psychiatric: Patient ***denies memory problems Allergic/Immunologic: Patient ***denies recent allergic reaction(s) Hematologic/Lymphatic: Patient denies bleeding tendencies Endocrine: Patient ***denies heat/cold intolerance  GU: As per HPI.  OBJECTIVE There were no vitals filed for this visit. There is no height or weight on file to calculate BMI.  Physical Examination  Constitutional: ***No obvious distress; patient is ***non-toxic appearing  Cardiovascular: ***No visible lower extremity edema.  Respiratory: The patient does ***not have audible wheezing/stridor; respirations do ***not appear labored  ***Integumentary: *** Incision well approximated and intact, dry, and without erythema, warmth, or drainage.  Gastrointestinal: Abdomen ***non-distended Musculoskeletal: ***Normal ROM of UEs  Neurologic: CN 2-12 grossly ***intact Psychiatric: Answered  questions ***appropriately with ***normal affect   GU: ***Incision well healed, intact, with no surrounding erythema, edema, crepitus, fluctuance, drainage / discharge, warmth, significant tenderness to palpation.   UA: {Desc; negative/positive:13464} *** WBC/hpf, *** RBC/hpf, bacteria (***) *** nitrites, *** leukocytes, *** blood PVR: *** ml  ASSESSMENT No diagnosis found. *** We reviewed the operative procedures and findings. Pre-operative symptoms are *** since the procedure. Surgical site healing *** well. Pain is*** well controlled. *** removed; patient tolerated ***well.  Will plan for follow up in *** months / ***1 year or sooner if needed. Pt verbalized understanding and agreement. All questions were answered.  PLAN Advised the following: *** ***No follow-ups on file.  No orders of the defined types were placed in this encounter.   It has been explained that the patient is to follow regularly with their PCP in addition to all other providers involved in their care and to follow instructions provided by these respective offices. Patient advised to contact urology clinic if any urologic-pertaining questions, concerns, new symptoms or problems arise in the interim period.  There are no Patient Instructions on file for this visit.  Electronically signed by:  Donnita Falls, MSN, FNP-C, CUNP 02/03/2023 2:41 PM

## 2023-02-04 ENCOUNTER — Other Ambulatory Visit: Payer: Self-pay

## 2023-02-04 ENCOUNTER — Encounter (HOSPITAL_COMMUNITY): Payer: Self-pay

## 2023-02-04 ENCOUNTER — Inpatient Hospital Stay (HOSPITAL_COMMUNITY)
Admission: EM | Admit: 2023-02-04 | Discharge: 2023-02-06 | DRG: 871 | Disposition: A | Discharge status: Home Health | Payer: 59 | Attending: Internal Medicine | Admitting: Internal Medicine

## 2023-02-04 ENCOUNTER — Emergency Department (HOSPITAL_COMMUNITY): Payer: 59

## 2023-02-04 DIAGNOSIS — J189 Pneumonia, unspecified organism: Secondary | ICD-10-CM

## 2023-02-04 DIAGNOSIS — Z7901 Long term (current) use of anticoagulants: Secondary | ICD-10-CM

## 2023-02-04 DIAGNOSIS — Z720 Tobacco use: Secondary | ICD-10-CM | POA: Diagnosis not present

## 2023-02-04 DIAGNOSIS — Z1152 Encounter for screening for COVID-19: Secondary | ICD-10-CM

## 2023-02-04 DIAGNOSIS — J9611 Chronic respiratory failure with hypoxia: Secondary | ICD-10-CM

## 2023-02-04 DIAGNOSIS — Z9842 Cataract extraction status, left eye: Secondary | ICD-10-CM

## 2023-02-04 DIAGNOSIS — Z880 Allergy status to penicillin: Secondary | ICD-10-CM

## 2023-02-04 DIAGNOSIS — Z6830 Body mass index (BMI) 30.0-30.9, adult: Secondary | ICD-10-CM

## 2023-02-04 DIAGNOSIS — I10 Essential (primary) hypertension: Secondary | ICD-10-CM | POA: Diagnosis present

## 2023-02-04 DIAGNOSIS — J449 Chronic obstructive pulmonary disease, unspecified: Secondary | ICD-10-CM

## 2023-02-04 DIAGNOSIS — Z825 Family history of asthma and other chronic lower respiratory diseases: Secondary | ICD-10-CM

## 2023-02-04 DIAGNOSIS — Z823 Family history of stroke: Secondary | ICD-10-CM | POA: Diagnosis not present

## 2023-02-04 DIAGNOSIS — E782 Mixed hyperlipidemia: Secondary | ICD-10-CM

## 2023-02-04 DIAGNOSIS — Z993 Dependence on wheelchair: Secondary | ICD-10-CM | POA: Diagnosis not present

## 2023-02-04 DIAGNOSIS — Z889 Allergy status to unspecified drugs, medicaments and biological substances status: Secondary | ICD-10-CM

## 2023-02-04 DIAGNOSIS — F17210 Nicotine dependence, cigarettes, uncomplicated: Secondary | ICD-10-CM | POA: Diagnosis present

## 2023-02-04 DIAGNOSIS — Z885 Allergy status to narcotic agent status: Secondary | ICD-10-CM

## 2023-02-04 DIAGNOSIS — F419 Anxiety disorder, unspecified: Secondary | ICD-10-CM | POA: Diagnosis present

## 2023-02-04 DIAGNOSIS — Z86718 Personal history of other venous thrombosis and embolism: Secondary | ICD-10-CM

## 2023-02-04 DIAGNOSIS — E876 Hypokalemia: Secondary | ICD-10-CM | POA: Diagnosis present

## 2023-02-04 DIAGNOSIS — Z79899 Other long term (current) drug therapy: Secondary | ICD-10-CM

## 2023-02-04 DIAGNOSIS — A419 Sepsis, unspecified organism: Principal | ICD-10-CM

## 2023-02-04 DIAGNOSIS — Z833 Family history of diabetes mellitus: Secondary | ICD-10-CM | POA: Diagnosis not present

## 2023-02-04 DIAGNOSIS — K219 Gastro-esophageal reflux disease without esophagitis: Secondary | ICD-10-CM | POA: Diagnosis present

## 2023-02-04 DIAGNOSIS — R6510 Systemic inflammatory response syndrome (SIRS) of non-infectious origin without acute organ dysfunction: Secondary | ICD-10-CM

## 2023-02-04 DIAGNOSIS — Z8249 Family history of ischemic heart disease and other diseases of the circulatory system: Secondary | ICD-10-CM | POA: Diagnosis not present

## 2023-02-04 DIAGNOSIS — Z96651 Presence of right artificial knee joint: Secondary | ICD-10-CM | POA: Diagnosis present

## 2023-02-04 DIAGNOSIS — R569 Unspecified convulsions: Secondary | ICD-10-CM

## 2023-02-04 DIAGNOSIS — W050XXA Fall from non-moving wheelchair, initial encounter: Secondary | ICD-10-CM | POA: Diagnosis present

## 2023-02-04 DIAGNOSIS — Z888 Allergy status to other drugs, medicaments and biological substances status: Secondary | ICD-10-CM

## 2023-02-04 DIAGNOSIS — I82409 Acute embolism and thrombosis of unspecified deep veins of unspecified lower extremity: Secondary | ICD-10-CM

## 2023-02-04 DIAGNOSIS — Z8616 Personal history of COVID-19: Secondary | ICD-10-CM

## 2023-02-04 DIAGNOSIS — Z832 Family history of diseases of the blood and blood-forming organs and certain disorders involving the immune mechanism: Secondary | ICD-10-CM

## 2023-02-04 DIAGNOSIS — E669 Obesity, unspecified: Secondary | ICD-10-CM | POA: Diagnosis present

## 2023-02-04 DIAGNOSIS — J439 Emphysema, unspecified: Secondary | ICD-10-CM | POA: Diagnosis present

## 2023-02-04 DIAGNOSIS — Y92009 Unspecified place in unspecified non-institutional (private) residence as the place of occurrence of the external cause: Secondary | ICD-10-CM

## 2023-02-04 DIAGNOSIS — Z981 Arthrodesis status: Secondary | ICD-10-CM | POA: Diagnosis not present

## 2023-02-04 DIAGNOSIS — G40909 Epilepsy, unspecified, not intractable, without status epilepticus: Secondary | ICD-10-CM

## 2023-02-04 DIAGNOSIS — J440 Chronic obstructive pulmonary disease with acute lower respiratory infection: Secondary | ICD-10-CM | POA: Diagnosis present

## 2023-02-04 DIAGNOSIS — R531 Weakness: Secondary | ICD-10-CM

## 2023-02-04 DIAGNOSIS — Z882 Allergy status to sulfonamides status: Secondary | ICD-10-CM

## 2023-02-04 DIAGNOSIS — G4733 Obstructive sleep apnea (adult) (pediatric): Secondary | ICD-10-CM | POA: Diagnosis present

## 2023-02-04 DIAGNOSIS — W19XXXA Unspecified fall, initial encounter: Secondary | ICD-10-CM

## 2023-02-04 DIAGNOSIS — Z9841 Cataract extraction status, right eye: Secondary | ICD-10-CM

## 2023-02-04 LAB — CBC WITH DIFFERENTIAL/PLATELET
Abs Immature Granulocytes: 0.19 10*3/uL — ABNORMAL HIGH (ref 0.00–0.07)
Basophils Absolute: 0.1 10*3/uL (ref 0.0–0.1)
Basophils Relative: 0 %
Eosinophils Absolute: 0 10*3/uL (ref 0.0–0.5)
Eosinophils Relative: 0 %
HCT: 32.8 % — ABNORMAL LOW (ref 36.0–46.0)
Hemoglobin: 10.2 g/dL — ABNORMAL LOW (ref 12.0–15.0)
Immature Granulocytes: 1 %
Lymphocytes Relative: 7 %
Lymphs Abs: 1.8 10*3/uL (ref 0.7–4.0)
MCH: 30.6 pg (ref 26.0–34.0)
MCHC: 31.1 g/dL (ref 30.0–36.0)
MCV: 98.5 fL (ref 80.0–100.0)
Monocytes Absolute: 1.5 10*3/uL — ABNORMAL HIGH (ref 0.1–1.0)
Monocytes Relative: 6 %
Neutro Abs: 22.2 10*3/uL — ABNORMAL HIGH (ref 1.7–7.7)
Neutrophils Relative %: 86 %
Platelets: 251 10*3/uL (ref 150–400)
RBC: 3.33 MIL/uL — ABNORMAL LOW (ref 3.87–5.11)
RDW: 15.2 % (ref 11.5–15.5)
WBC: 25.7 10*3/uL — ABNORMAL HIGH (ref 4.0–10.5)
nRBC: 0 % (ref 0.0–0.2)

## 2023-02-04 LAB — URINALYSIS, W/ REFLEX TO CULTURE (INFECTION SUSPECTED)
Bilirubin Urine: NEGATIVE
Glucose, UA: NEGATIVE mg/dL
Hgb urine dipstick: NEGATIVE
Ketones, ur: NEGATIVE mg/dL
Nitrite: NEGATIVE
Protein, ur: NEGATIVE mg/dL
Specific Gravity, Urine: 1.026 (ref 1.005–1.030)
pH: 5 (ref 5.0–8.0)

## 2023-02-04 LAB — CULTURE, BLOOD (ROUTINE X 2)

## 2023-02-04 LAB — BASIC METABOLIC PANEL
Anion gap: 8 (ref 5–15)
BUN: 23 mg/dL (ref 8–23)
CO2: 21 mmol/L — ABNORMAL LOW (ref 22–32)
Calcium: 8.5 mg/dL — ABNORMAL LOW (ref 8.9–10.3)
Chloride: 107 mmol/L (ref 98–111)
Creatinine, Ser: 1.02 mg/dL — ABNORMAL HIGH (ref 0.44–1.00)
GFR, Estimated: >60 mL/min (ref >60)
Glucose, Bld: 114 mg/dL — ABNORMAL HIGH (ref 70–99)
Potassium: 3.3 mmol/L — ABNORMAL LOW (ref 3.5–5.1)
Sodium: 136 mmol/L (ref 135–145)

## 2023-02-04 LAB — TYPE AND SCREEN
ABO/RH(D): A POS
Antibody Screen: NEGATIVE

## 2023-02-04 LAB — TROPONIN I (HIGH SENSITIVITY)
Troponin I (High Sensitivity): 5 ng/L (ref <18)
Troponin I (High Sensitivity): 5 ng/L (ref <18)

## 2023-02-04 LAB — LACTIC ACID, PLASMA: Lactic Acid, Venous: 0.6 mmol/L (ref 0.5–1.9)

## 2023-02-04 LAB — SARS CORONAVIRUS 2 BY RT PCR: SARS Coronavirus 2 by RT PCR: NEGATIVE

## 2023-02-04 LAB — PROTIME-INR
INR: 1.1 (ref 0.8–1.2)
Prothrombin Time: 14.4 seconds (ref 11.4–15.2)

## 2023-02-04 LAB — GLUCOSE, CAPILLARY: Glucose-Capillary: 109 mg/dL — ABNORMAL HIGH (ref 70–99)

## 2023-02-04 MED ORDER — POTASSIUM CHLORIDE CRYS ER 20 MEQ PO TBCR
40.0000 meq | EXTENDED_RELEASE_TABLET | Freq: Once | ORAL | Status: AC
  Administered 2023-02-05: 40 meq via ORAL
  Filled 2023-02-04: qty 2

## 2023-02-04 MED ORDER — LAMOTRIGINE 100 MG PO TABS
300.0000 mg | ORAL_TABLET | Freq: Two times a day (BID) | ORAL | Status: DC
  Administered 2023-02-05 – 2023-02-06 (×4): 300 mg via ORAL
  Filled 2023-02-04 (×4): qty 3

## 2023-02-04 MED ORDER — IPRATROPIUM-ALBUTEROL 0.5-2.5 (3) MG/3ML IN SOLN
3.0000 mL | Freq: Four times a day (QID) | RESPIRATORY_TRACT | Status: DC | PRN
  Administered 2023-02-06: 3 mL via RESPIRATORY_TRACT
  Filled 2023-02-04 (×2): qty 3

## 2023-02-04 MED ORDER — RIVAROXABAN 20 MG PO TABS
20.0000 mg | ORAL_TABLET | Freq: Every day | ORAL | Status: DC
  Administered 2023-02-05 – 2023-02-06 (×2): 20 mg via ORAL
  Filled 2023-02-04 (×2): qty 1

## 2023-02-04 MED ORDER — ACETAMINOPHEN 325 MG PO TABS
650.0000 mg | ORAL_TABLET | Freq: Once | ORAL | Status: AC | PRN
  Administered 2023-02-04: 650 mg via ORAL
  Filled 2023-02-04: qty 2

## 2023-02-04 MED ORDER — ALBUTEROL SULFATE (2.5 MG/3ML) 0.083% IN NEBU: 3.0000 mL | INHALATION_SOLUTION | Freq: Four times a day (QID) | RESPIRATORY_TRACT | Status: DC | PRN

## 2023-02-04 MED ORDER — ACETAMINOPHEN 650 MG RE SUPP: 650.0000 mg | Freq: Four times a day (QID) | RECTAL | Status: DC | PRN

## 2023-02-04 MED ORDER — VANCOMYCIN HCL IN DEXTROSE 1-5 GM/200ML-% IV SOLN
1000.0000 mg | INTRAVENOUS | Status: DC
  Filled 2023-02-04: qty 200

## 2023-02-04 MED ORDER — ACETAMINOPHEN 325 MG PO TABS
650.0000 mg | ORAL_TABLET | Freq: Four times a day (QID) | ORAL | Status: DC | PRN
  Administered 2023-02-05: 650 mg via ORAL
  Filled 2023-02-04 (×2): qty 2

## 2023-02-04 MED ORDER — SODIUM CHLORIDE 0.9 % IV SOLN
1.0000 g | Freq: Three times a day (TID) | INTRAVENOUS | Status: DC
  Administered 2023-02-04 – 2023-02-05 (×2): 1 g via INTRAVENOUS
  Filled 2023-02-04 (×6): qty 5

## 2023-02-04 MED ORDER — MONTELUKAST SODIUM 10 MG PO TABS
10.0000 mg | ORAL_TABLET | Freq: Every day | ORAL | Status: DC
  Administered 2023-02-05 (×2): 10 mg via ORAL
  Filled 2023-02-04 (×2): qty 1

## 2023-02-04 MED ORDER — ONDANSETRON HCL 4 MG PO TABS
4.0000 mg | ORAL_TABLET | Freq: Four times a day (QID) | ORAL | Status: DC | PRN
  Administered 2023-02-06: 4 mg via ORAL
  Filled 2023-02-04: qty 1

## 2023-02-04 MED ORDER — DM-GUAIFENESIN ER 30-600 MG PO TB12
1.0000 | ORAL_TABLET | Freq: Two times a day (BID) | ORAL | Status: DC
  Administered 2023-02-05 – 2023-02-06 (×4): 1 via ORAL
  Filled 2023-02-04 (×4): qty 1

## 2023-02-04 MED ORDER — PANTOPRAZOLE SODIUM 40 MG PO TBEC
40.0000 mg | DELAYED_RELEASE_TABLET | Freq: Two times a day (BID) | ORAL | Status: DC
  Administered 2023-02-05 – 2023-02-06 (×4): 40 mg via ORAL
  Filled 2023-02-04 (×4): qty 1

## 2023-02-04 MED ORDER — SODIUM CHLORIDE 0.9 % IV BOLUS
500.0000 mL | Freq: Once | INTRAVENOUS | Status: AC
  Administered 2023-02-04: 500 mL via INTRAVENOUS

## 2023-02-04 MED ORDER — TOPIRAMATE 100 MG PO TABS
200.0000 mg | ORAL_TABLET | Freq: Two times a day (BID) | ORAL | Status: DC
  Administered 2023-02-05 – 2023-02-06 (×4): 200 mg via ORAL
  Filled 2023-02-04 (×4): qty 2

## 2023-02-04 MED ORDER — MOXIFLOXACIN HCL IN NACL 400 MG/250ML IV SOLN
400.0000 mg | INTRAVENOUS | Status: DC
  Filled 2023-02-04: qty 250

## 2023-02-04 MED ORDER — ONDANSETRON HCL 4 MG/2ML IJ SOLN: 4.0000 mg | Freq: Four times a day (QID) | INTRAMUSCULAR | Status: DC | PRN

## 2023-02-04 MED ORDER — SIMVASTATIN 20 MG PO TABS
40.0000 mg | ORAL_TABLET | Freq: Every day | ORAL | Status: DC
  Administered 2023-02-05: 40 mg via ORAL
  Filled 2023-02-04: qty 2

## 2023-02-04 MED ORDER — VANCOMYCIN HCL 1500 MG/300ML IV SOLN
1500.0000 mg | Freq: Once | INTRAVENOUS | Status: AC
  Administered 2023-02-04: 1500 mg via INTRAVENOUS
  Filled 2023-02-04: qty 300

## 2023-02-04 NOTE — ED Notes (Signed)
Patient transported to X-ray 

## 2023-02-04 NOTE — ED Notes (Signed)
ED TO INPATIENT HANDOFF REPORT  ED Nurse Name and Phone #:   S Name/Age/Gender [name] 67 y.o. female Room/Bed: APA02/APA02  Code Status   Code Status: Prior  Home/SNF/Other Home Patient oriented to: self, place, time, and situation Is this baseline? Yes   Triage Complete: Triage complete  Chief Complaint Left upper lobe pneumonia [J18.9]  Triage Note Pt called EMS for a fall with left knee pain. Per EMS knee is swollen but no deformities noted. Pt on chronic oxygen at 3L. VS WDL per EMS. Pt's left knee noted to be swollen in triage.    Allergies Allergies  Allergen Reactions   Amoxicillin Anaphylaxis    Breakout, mouth swells   Azithromycin Swelling and Rash    "swelling all over, hives, thrush"   Cefdinir     Throat swelling after 3rd dose   Chantix [Varenicline] Other (See Comments)    "bad dreams, difficulty breathing"   Doxycycline Hyclate Anaphylaxis and Rash    swelling   Levofloxacin Anaphylaxis    Pt can take moxifloxacin   Penicillins Anaphylaxis, Shortness Of Breath and Rash    "ALMOST DIED, ended up in hospital"    Sulfonamide Derivatives Shortness Of Breath, Swelling and Rash    "break out from head to toe"   Toradol [Ketorolac Tromethamine] Other (See Comments)    Seizure    Clarithromycin Hives    swelling   Nortriptyline Swelling, Rash and Other (See Comments)    Per patient had "kidney problems" on this medication, could not use bathroom (urinary retention)   Paxlovid [Nirmatrelvir-Ritonavir] Swelling    Throat swelling   Codeine Nausea And Vomiting   Fluticasone-Salmeterol Other (See Comments)    REACTION: ulcers in mouth   Triptans Rash    Mouth breaks out and start itching    Level of Care/Admitting Diagnosis ED Disposition     ED Disposition  Admit   Condition  --   Comment  Hospital Area: [facility] [[id number]]  Level of Care: Med-Surg [16]  Covid Evaluation: Asymptomatic - no recent exposure (last 10 days) testing  not required  Diagnosis: Left upper lobe pneumonia [161096]  Admitting Physician: Frankey Shown [[id number]]  Attending Physician: Frankey Shown [[id number]]  Certification:: I certify this patient will need inpatient services for at least 2 midnights  Estimated Length of Stay: 3          B Medical/Surgery History Past Medical History:  Diagnosis Date   Allergic rhinitis    Anxiety    Arthritis    "hands and legs and feet" (04/27/2012)   Asthma    Cancer (HCC) 2005   rt arm mole rem cancer   CHF (congestive heart failure) (HCC)    Chronic bronchitis (HCC)    "keep it all the time" (04/27/2012)   Chronic kidney disease    Chronic kidney disease (CKD)    Stage III   COPD (chronic obstructive pulmonary disease) (HCC)    COVID-19 virus infection    03-2021   Depression    PT DENIES   Diverticulosis    DJD (degenerative joint disease)    Emphysema    Epilepsy (HCC)    Epilepsy (HCC)    Esophageal stricture    Exertional dyspnea    GERD (gastroesophageal reflux disease)    Glaucoma    H/O blood clots    Right leg   Heart failure (HCC)    History of COVID-19 03/2021   History of kidney stones    History  of nuclear stress test    Myoview 12/25/22: EF 74, no ischemia or infarction, low risk   Hyperlipidemia    Hypertension    IBS (irritable bowel syndrome)    Migraine    Neck pain    Cervical disc   OSA on CPAP    6-7 yrs; pt wears CPAP but has not been able to use it lately due to sinus issues   Oxygen deficiency    USES 3 LITERS 02 CONTINOUS   Pneumonia 2012; 04/27/2012   Recurrent upper respiratory infection (URI)    Right leg DVT (HCC) 1982   groin   Seizures (HCC)    LAST SEIZURE 6 MONTHS OR  LONGER   Sleep apnea    Type II diabetes mellitus (HCC)    No meds since weight loss   UTI (urinary tract infection)    2 weeks ago   Vertigo 10/24/2014   Past Surgical History:  Procedure Laterality Date   ABDOMINAL HYSTERECTOMY  2001   "partial"   BIOPSY   04/16/2021   Procedure: BIOPSY;  Surgeon: Napoleon Form, MD;  Location: [facility];  Service: Endoscopy;;   CARPAL TUNNEL RELEASE  ~ 2008   right   CARPAL TUNNEL RELEASE Right    CATARACT EXTRACTION Bilateral    COLONOSCOPY     COLONOSCOPY WITH ESOPHAGOGASTRODUODENOSCOPY (EGD)     CYSTOSCOPY/URETEROSCOPY/HOLMIUM LASER/STENT PLACEMENT Left 01/27/2023   Procedure: CYSTOSCOPY LEFT RETROGRADE PYELOGRAM, LEFT URETEROSCOPY, HOLMIUM LASER LITHOTRIPSY, AND LEFT URETERAL STENT PLACEMENT;  Surgeon: Bjorn Pippin, MD;  Location: [facility];  Service: Urology;  Laterality: Left;  60 MINUTES   ESOPHAGOGASTRODUODENOSCOPY (EGD) WITH PROPOFOL N/A 04/16/2021   Procedure: ESOPHAGOGASTRODUODENOSCOPY (EGD) WITH PROPOFOL;  Surgeon: Napoleon Form, MD;  Location: [facility];  Service: Endoscopy;  Laterality: N/A;   KNEE ARTHROSCOPY  1990's   left   NASAL SEPTOPLASTY W/ TURBINOPLASTY Bilateral 05/23/2016   Procedure: NASAL SEPTOPLASTY WITH TURBINATE REDUCTION;  Surgeon: Osborn Coho, MD;  Location: [facility] OR;  Service: ENT;  Laterality: Bilateral;   SHOULDER OPEN ROTATOR CUFF REPAIR  ~ 2010   left   SINOSCOPY     SINUS ENDO WITH FUSION Right 05/23/2016   Procedure: LIMITED RIGHT ENDOSCOPIC SINUS SURGERY;  Surgeon: Osborn Coho, MD;  Location: [facility] OR;  Service: ENT;  Laterality: Right;   TOTAL KNEE ARTHROPLASTY  08/23/2012   right   TOTAL KNEE ARTHROPLASTY  08/23/2012   Procedure: TOTAL KNEE ARTHROPLASTY;  Surgeon: Nilda Simmer, MD;  Location: [facility] OR;  Service: Orthopedics;  Laterality: Right;  RIGHT KNEE ARTHROPLASTY MEDIAL AND LATERAL COMPARTMENTS WITH PATELLA RESURFACING   TUBAL LIGATION  2001   UPPER GASTROINTESTINAL ENDOSCOPY       A IV Location/Drains/Wounds Patient Lines/Drains/Airways Status     Active Line/Drains/Airways     Name Placement date Placement time Site Days   Peripheral IV 02/04/23 20 G Anterior;Right Forearm 02/04/23  1946  Forearm  less than 1   Ureteral Drain/Stent  Left ureter 6 Fr. 01/27/23  1356  Left ureter  8   Pressure Injury 08/06/22 Buttocks Right Stage 2 -  Partial thickness loss of dermis presenting as a shallow open injury with a red, pink wound bed without slough. Nearly healed pressure wound to right buttocks. 08/06/22  1601  -- 182            Intake/Output Last 24 hours No intake or output data in the 24 hours ending 02/04/23 2158  Labs/Imaging Results for orders placed or performed during  the hospital encounter of 02/04/23 (from the past 48 hour(s))  SARS Coronavirus 2 by RT PCR (hospital order, performed in [facility] hospital lab) *cepheid single result test* Anterior Nasal Swab     Status: None   Collection Time: 02/04/23  7:17 PM   Specimen: Anterior Nasal Swab  Result Value Ref Range   SARS Coronavirus 2 by RT PCR NEGATIVE NEGATIVE    Comment: (NOTE) SARS-CoV-2 target nucleic acids are NOT DETECTED.  The SARS-CoV-2 RNA is generally detectable in upper and lower respiratory specimens during the acute phase of infection. The lowest concentration of SARS-CoV-2 viral copies this assay can detect is 250 copies / mL. A negative result does not preclude SARS-CoV-2 infection and should not be used as the sole basis for treatment or other patient management decisions.  A negative result may occur with improper specimen collection / handling, submission of specimen other than nasopharyngeal swab, presence of viral mutation(s) within the areas targeted by this assay, and inadequate number of viral copies (<250 copies / mL). A negative result must be combined with clinical observations, patient history, and epidemiological information.  Fact Sheet for Patients:   RoadLapTop.co.za  Fact Sheet for Healthcare Providers: http://kim-miller.com/  This test is not yet approved or  cleared by the [place] FDA and has been authorized for detection and/or diagnosis of SARS-CoV-2 by FDA  under an Emergency Use Authorization (EUA).  This EUA will remain in effect (meaning this test can be used) for the duration of the COVID-19 declaration under Section 564(b)(1) of the Act, 21 U.S.C. section 360bbb-3(b)(1), unless the authorization is terminated or revoked sooner.  Performed at [facility], [address] [place], [place] [address]   Basic metabolic panel     Status: Abnormal   Collection Time: 02/04/23  7:50 PM  Result Value Ref Range   Sodium 136 135 - 145 mmol/L   Potassium 3.3 (L) 3.5 - 5.1 mmol/L   Chloride 107 98 - 111 mmol/L   CO2 21 (L) 22 - 32 mmol/L   Glucose, Bld 114 (H) 70 - 99 mg/dL    Comment: Glucose reference range applies only to samples taken after fasting for at least 8 hours.   BUN 23 8 - 23 mg/dL   Creatinine, Ser 6.04 (H) 0.44 - 1.00 mg/dL   Calcium 8.5 (L) 8.9 - 10.3 mg/dL   GFR, Estimated >54 >09 mL/min    Comment: (NOTE) Calculated using the CKD-EPI Creatinine Equation (2021)    Anion gap 8 5 - 15    Comment: Performed at [facility], [address], [place], [place] [address]  Troponin I (High Sensitivity)     Status: None   Collection Time: 02/04/23  7:50 PM  Result Value Ref Range   Troponin I (High Sensitivity) 5 <18 ng/L    Comment: (NOTE) Elevated high sensitivity troponin I (hsTnI) values and significant  changes across serial measurements may suggest ACS but many other  chronic and acute conditions are known to elevate hsTnI results.  Refer to the "Links" section for chest pain algorithms and additional  guidance. Performed at [facility], [address], [place], [place] [address]   CBC with Differential     Status: Abnormal   Collection Time: 02/04/23  7:50 PM  Result Value Ref Range   WBC 25.7 (H) 4.0 - 10.5 K/uL   RBC 3.33 (L) 3.87 - 5.11 MIL/uL   Hemoglobin 10.2 (L) 12.0 - 15.0 g/dL   HCT 56.2 (L) 13.0 -  46.0 %   MCV 98.5 80.0 - 100.0 fL   MCH 30.6 26.0 - 34.0 pg   MCHC 31.1 30.0 - 36.0 g/dL   RDW 82.9 56.2 - 13.0  %   Platelets 251 150 - 400 K/uL   nRBC 0.0 0.0 - 0.2 %   Neutrophils Relative % 86 %   Neutro Abs 22.2 (H) 1.7 - 7.7 K/uL   Lymphocytes Relative 7 %   Lymphs Abs 1.8 0.7 - 4.0 K/uL   Monocytes Relative 6 %   Monocytes Absolute 1.5 (H) 0.1 - 1.0 K/uL   Eosinophils Relative 0 %   Eosinophils Absolute 0.0 0.0 - 0.5 K/uL   Basophils Relative 0 %   Basophils Absolute 0.1 0.0 - 0.1 K/uL   WBC Morphology See Note     Comment: LEUKOCYTOSIS   RBC Morphology MORPHOLOGY UNREMARKABLE    Smear Review MORPHOLOGY UNREMARKABLE    Immature Granulocytes 1 %   Abs Immature Granulocytes 0.19 (H) 0.00 - 0.07 K/uL    Comment: Performed at [facility], [address], [place], [place] [address]  Culture, blood (routine x 2)     Status: None (Preliminary result)   Collection Time: 02/04/23  7:50 PM   Specimen: Vein; Blood  Result Value Ref Range   Specimen Description BLOOD BLOOD RIGHT ARM    Special Requests      BOTTLES DRAWN AEROBIC AND ANAEROBIC Blood Culture results may not be optimal due to an excessive volume of blood received in culture bottles Performed at [facility], [address] [place], [place] [address]    Culture PENDING    Report Status PENDING   Culture, blood (routine x 2)     Status: None (Preliminary result)   Collection Time: 02/04/23  7:50 PM   Specimen: Vein; Blood  Result Value Ref Range   Specimen Description BLOOD BLOOD LEFT ARM    Special Requests      BOTTLES DRAWN AEROBIC AND ANAEROBIC Blood Culture adequate volume Performed at [facility], [address], [place], [place] [address]    Culture PENDING    Report Status PENDING   Lactic acid, plasma     Status: None   Collection Time: 02/04/23  7:50 PM  Result Value Ref Range   Lactic Acid, Venous 0.6 0.5 - 1.9 mmol/L    Comment: Performed at [facility], [address] [place], [place] [address]  Type and screen [facility]     Status: None   Collection Time: 02/04/23  7:50 PM  Result Value Ref Range    ABO/RH(D) A POS    Antibody Screen NEG    Sample Expiration      02/07/2023,2359 Performed at [facility], [address], [place], [place] [address]   Protime-INR     Status: None   Collection Time: 02/04/23  7:50 PM  Result Value Ref Range   Prothrombin Time 14.4 11.4 - 15.2 seconds   INR 1.1 0.8 - 1.2    Comment: (NOTE) INR goal varies based on device and disease states. Performed at [facility], [address], [place], [place] [address]    *Note: Due to a large number of results and/or encounters for the requested time period, some results have not been displayed. A complete set of results can be found in Results Review.   DG Chest Port 1 View  Result Date: 02/04/2023 CLINICAL DATA:  Weakness.  Cough EXAM: PORTABLE CHEST 1 VIEW COMPARISON:  X-ray 09/26/2022 FINDINGS: Developing parenchymal  opacity along the left upper thorax. Acute infiltrate is possible and recommend follow-up. No pneumothorax, effusion or edema. Normal cardiopericardial silhouette. IMPRESSION: Developing opacity left upper lobe. Possible acute infiltrate. Recommend follow-up to confirm clearance Electronically Signed   By: Karen Kays M.D.   On: 02/04/2023 19:27   DG Knee 2 Views Right  Result Date: 02/04/2023 CLINICAL DATA:  Pain after fall EXAM: RIGHT KNEE - 2 VIEW COMPARISON:  None Available. FINDINGS: Total knee arthroplasty identified with cemented femoral and tibial component. Patellar button. No fracture or dislocation. No obvious hardware failure. Preserved bone mineralization. No joint effusion on lateral view. IMPRESSION: Total knee arthroplasty Electronically Signed   By: Karen Kays M.D.   On: 02/04/2023 18:06   DG Hip Unilat  With Pelvis 2-3 Views Right  Result Date: 02/04/2023 CLINICAL DATA:  Pain after fall EXAM: DG HIP (WITH OR WITHOUT PELVIS) 3V RIGHT COMPARISON:  01/18/2019 FINDINGS: No fracture or dislocation. Preserved joint spaces. Preserved bone mineralization. Presumed contrast with a  some diverticula along the pelvis. Moderate colonic stool. IMPRESSION: No acute osseous abnormality Electronically Signed   By: Karen Kays M.D.   On: 02/04/2023 18:05    Pending Labs Unresulted Labs (From admission, onward)     Start     Ordered   02/04/23 1859  Urinalysis, w/ Reflex to Culture (Infection Suspected) -Urine, Clean Catch  Once,   URGENT       Question:  Specimen Source  Answer:  Urine, Clean Catch   02/04/23 1900            Vitals/Pain Today's Vitals   02/04/23 1900 02/04/23 1915 02/04/23 1925 02/04/23 2130  BP:   100/64 (!) 143/113  Pulse: (!) 103 100 97   Resp:   20 (!) 30  Temp:      TempSrc:      SpO2: 100% 99% 94%   Weight:      Height:      PainSc:        Isolation Precautions Airborne and Contact precautions  Medications Medications  aztreonam (AZACTAM) 1 g in sodium chloride 0.9 % 100 mL IVPB (1 g Intravenous New Bag/Given 02/04/23 2141)  vancomycin (VANCOREADY) IVPB 1500 mg/300 mL (has no administration in time range)  vancomycin (VANCOCIN) IVPB 1000 mg/200 mL premix (has no administration in time range)  acetaminophen (TYLENOL) tablet 650 mg (650 mg Oral Given 02/04/23 1807)  sodium chloride 0.9 % bolus 500 mL (0 mLs Intravenous Stopped 02/04/23 2128)    Mobility walks     Focused Assessments    R Recommendations: See Admitting Provider Note  Report given to:   Additional Notes:

## 2023-02-04 NOTE — ED Provider Notes (Signed)
[place] [facility] AT [facility] Provider Note   CSN: [id number] Arrival date & time: 02/04/23  1716     History {Add pertinent medical, surgical, social history, OB history to HPI:1} Chief Complaint  Patient presents with   [name] is a 67 y.o. female.  He is brought in by ambulance from home after a fall.  She cannot really explain why she fell.  Complaining of left knee pain after fall.  She said she has been generally weak.  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  Denies hitting her head although does endorse a headache.  Husband states she has been having intermittent hematuria from a kidney stone.  She denies any chest pain shortness of breath abdominal pain.  Does endorse a cough productive of orange sputum.  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  She does not ambulate, uses a Wheelchair  The history is provided by the patient and the spouse.  Weakness Severity:  Severe Onset quality:  Gradual Timing:  Constant Progression:  Worsening Chronicity:  New Relieved by:  Nothing Worsened by:  Activity Ineffective treatments:  None tried Associated symptoms: cough, difficulty walking, falls and fever   Associated symptoms: no abdominal pain, no chest pain, no diarrhea, no dysuria, no foul-smelling urine, no loss of consciousness, no nausea, no shortness of breath and no vomiting        Home Medications Prior to Admission medications   Medication Sig Start Date End Date Taking? Authorizing Provider  albuterol (PROAIR HFA) 108 (90 Base) MCG/ACT inhaler INHALE TWO PUFFS INTO THE LUNGS EVERY 6 HOURS AS NEEDED FOR SHORTNESS OF BREATH Patient taking differently: Inhale 2 puffs into the lungs every 6 (six) hours as needed for shortness of breath or wheezing. 12/18/20   [name], [name], MD  albuterol (PROVENTIL) (2.5 MG/3ML) 0.083% nebulizer solution USE 1 VIAL IN NEBULIZER EVERY 6 HOURS AS NEEDED FOR SHORTNESS OF  BREATH Patient taking differently: Take 2.5 mg by nebulization every 6 (six) hours as needed for wheezing or shortness of breath. 01/18/21   [name], Tawanna Cooler, MD  allopurinol (ZYLOPRIM) 100 MG tablet Take 100 mg by mouth daily.    [provider]  ALPRAZolam Prudy Feeler) 0.25 MG tablet Take 0.25 mg by mouth 2 (two) times daily. 07/14/22   [provider]  Azelastine HCl (ASTEPRO) 0.15 % SOLN Place 1 spray into the nose 2 (two) times daily as needed (Allergies). 01/13/22   Coralyn Helling, MD  cephALEXin (KEFLEX) 500 MG capsule Take 1 capsule (500 mg total) by mouth 4 (four) times daily. 01/27/23   Bjorn Pippin, MD  Cholecalciferol (VITAMIN D3) 50 MCG (2000 UT) capsule TAKE ONE CAPSULE BY MOUTH DAILY Patient taking differently: Take 2,000 Units by mouth daily. 03/27/21   [name], Tawanna Cooler, MD  famotidine (PEPCID) 40 MG tablet Take 40 mg by mouth at bedtime. 07/25/22   [provider]  ferrous sulfate 324 (65 Fe) MG TBEC TAKE ONE TABLET BY MOUTH EVERY OTHER DAY Patient taking differently: Take 324 mg by mouth every other day. 05/20/21   [name], [name], MD  gabapentin (NEURONTIN) 100 MG capsule Take 1 capsule (100 mg total) by mouth at bedtime. Patient taking differently: Take 100 mg by mouth 3 (three) times daily. 11/12/20   [name], Tawanna Cooler, MD  guaiFENesin (MUCINEX) 600 MG 12 hr tablet Take 2 tablets (1,200 mg total) by mouth 2 (two) times daily as needed. Patient taking differently: Take  1,200 mg by mouth 2 (two) times daily as needed for cough or to loosen phlegm. 07/10/22   Coralyn Helling, MD  ipratropium-albuterol (DUONEB) 0.5-2.5 (3) MG/3ML SOLN Take 3 mLs by nebulization every 6 (six) hours as needed (Wheezing/sob). 12/30/22   [provider]  lamoTRIgine (LAMICTAL) 200 MG tablet TAKE 1 AND 1/2 TABLETS BY MOUTH TWICE DAILY Patient taking differently: Take 300 mg by mouth 2 (two) times daily. 12/22/22   Windell Norfolk, MD  loratadine (ALLERGY  RELIEF) 10 MG tablet Take 1 tablet (10 mg total) by mouth daily. 01/13/23   Parrett, Virgel Bouquet, NP  montelukast (SINGULAIR) 10 MG tablet TAKE ONE TABLET BY MOUTH AT BEDTIME 06/27/22   Coralyn Helling, MD  oxyCODONE-acetaminophen (PERCOCET/ROXICET) 5-325 MG tablet Take 1 tablet by mouth every 6 (six) hours as needed for severe pain. 01/27/23   Bjorn Pippin, MD  OXYGEN Inhale 3 L into the lungs continuous.    [provider]  pantoprazole (PROTONIX) 40 MG tablet Take 1 tablet (40 mg total) by mouth 2 (two) times daily. **PLEASE CALL OFFICE TO SCHEDULE FOLLOW UP 01/29/22   Unk Lightning, PA  QUEtiapine (SEROQUEL) 100 MG tablet TAKE ONE TABLET BY MOUTH AT BEDTIME Patient taking differently: Take 100 mg by mouth at bedtime. 11/24/22   Windell Norfolk, MD  simvastatin (ZOCOR) 40 MG tablet TAKE ONE TABLET BY MOUTH AT BEDTIME Patient taking differently: Take 40 mg by mouth daily at 6 PM. 03/18/21   [name], [name], MD  tiZANidine (ZANAFLEX) 4 MG tablet TAKE ONE TABLET BY MOUTH EVERY 6 HOURS AS NEEDED FOR MUSCLE SPASMS FOR UP TO 30 DOSES Patient not taking: Reported on 01/22/2023 07/10/22   Windell Norfolk, MD  topiramate (TOPAMAX) 100 MG tablet Take 200 mg twice daily Patient taking differently: Take 200 mg by mouth 2 (two) times daily. 09/29/22   Windell Norfolk, MD  TRELEGY ELLIPTA 100-62.5-25 MCG/ACT AEPB INHALE 1 PUFF BY MOUTH INTO THE LUNGS DAILY IN THE AFTERNOON Patient taking differently: Inhale 1 puff into the lungs daily. 12/22/22   Coralyn Helling, MD  triamcinolone cream (KENALOG) 0.1 % Apply 1 Application topically daily as needed (irritation).    [provider]  XARELTO 20 MG TABS tablet Take 20 mg by mouth daily. 07/25/22   [provider]      Allergies    Amoxicillin, Azithromycin, Cefdinir, Chantix [varenicline], Doxycycline hyclate, Levofloxacin, Penicillins, Sulfonamide derivatives, Toradol [ketorolac tromethamine], Clarithromycin, Nortriptyline, Paxlovid  [nirmatrelvir-ritonavir], Codeine, Fluticasone-salmeterol, and Triptans    Review of Systems   Review of Systems  Constitutional:  Positive for fever.  Respiratory:  Positive for cough. Negative for shortness of breath.   Cardiovascular:  Negative for chest pain.  Gastrointestinal:  Negative for abdominal pain, diarrhea, nausea and vomiting.  Genitourinary:  Positive for hematuria. Negative for dysuria.  Musculoskeletal:  Positive for falls and gait problem.  Neurological:  Positive for weakness. Negative for loss of consciousness.    Physical Exam Updated Vital Signs BP 108/74   Pulse (!) 110   Temp (!) 100.8 F (38.2 C) (Oral)   Resp (!) 22   Ht 5\' 1"  (1.549 m)   Wt 74.4 kg   SpO2 97%   BMI 30.99 kg/m  Physical Exam Vitals and nursing note reviewed.  Constitutional:      General: She is not in acute distress.    Appearance: Normal appearance. She is well-developed.  HENT:     Head: Normocephalic and atraumatic.  Eyes:     Conjunctiva/sclera: Conjunctivae  normal.  Cardiovascular:     Rate and Rhythm: Normal rate and regular rhythm.     Heart sounds: No murmur heard. Pulmonary:     Effort: Pulmonary effort is normal. No respiratory distress.     Breath sounds: Normal breath sounds.  Abdominal:     Palpations: Abdomen is soft.     Tenderness: There is no abdominal tenderness. There is no guarding or rebound.  Musculoskeletal:        General: Tenderness present. No deformity. Normal range of motion.     Cervical back: Neck supple.     Comments: He has some tenderness around her left knee.  There is a well-healed surgical scar there.  Hip and ankle nontender.  Right lower extremity full range of motion without any pain or limitations.  Skin:    General: Skin is warm and dry.     Capillary Refill: Capillary refill takes less than 2 seconds.  Neurological:     General: No focal deficit present.     Motor: No weakness.     ED Results / Procedures / Treatments    Labs (all labs ordered are listed, but only abnormal results are displayed) Labs Reviewed - No data to display  EKG None  Radiology DG Knee 2 Views Right  Result Date: 02/04/2023 CLINICAL DATA:  Pain after fall EXAM: RIGHT KNEE - 2 VIEW COMPARISON:  None Available. FINDINGS: Total knee arthroplasty identified with cemented femoral and tibial component. Patellar button. No fracture or dislocation. No obvious hardware failure. Preserved bone mineralization. No joint effusion on lateral view. IMPRESSION: Total knee arthroplasty Electronically Signed   By: Karen Kays M.D.   On: 02/04/2023 18:06   DG Hip Unilat  With Pelvis 2-3 Views Right  Result Date: 02/04/2023 CLINICAL DATA:  Pain after fall EXAM: DG HIP (WITH OR WITHOUT PELVIS) 3V RIGHT COMPARISON:  01/18/2019 FINDINGS: No fracture or dislocation. Preserved joint spaces. Preserved bone mineralization. Presumed contrast with a some diverticula along the pelvis. Moderate colonic stool. IMPRESSION: No acute osseous abnormality Electronically Signed   By: Karen Kays M.D.   On: 02/04/2023 18:05    Procedures Procedures  {Document cardiac monitor, telemetry assessment procedure when appropriate:1}  Medications Ordered in ED Medications  sodium chloride 0.9 % bolus 500 mL (has no administration in time range)  acetaminophen (TYLENOL) tablet 650 mg (650 mg Oral Given 02/04/23 1807)    ED Course/ Medical Decision Making/ A&P   {   Click here for ABCD2, HEART and other calculatorsREFRESH Note before signing :1}                          Medical Decision Making Amount and/or Complexity of Data Reviewed Labs: ordered. Radiology: ordered.  Risk OTC drugs.  This patient complains of ***; this involves an extensive number of treatment Options and is a complaint that carries with it a high risk of complications and morbidity. The differential includes ***  I ordered, reviewed and interpreted labs, which included *** I ordered  medication *** and reviewed PMP when indicated. I ordered imaging studies which included *** and I independently    visualized and interpreted imaging which showed *** Additional history obtained from *** Previous records obtained and reviewed *** I consulted *** and discussed lab and imaging findings and discussed disposition.  Cardiac monitoring reviewed, *** Social determinants considered, *** Critical Interventions: ***  After the interventions stated above, I reevaluated the patient and found *** Admission  and further testing considered, ***   {Document critical care time when appropriate:1} {Document review of labs and clinical decision tools ie heart score, Chads2Vasc2 etc:1}  {Document your independent review of radiology images, and any outside records:1} {Document your discussion with family members, caretakers, and with consultants:1} {Document social determinants of health affecting pt's care:1} {Document your decision making why or why not admission, treatments were needed:1} Final Clinical Impression(s) / ED Diagnoses Final diagnoses:  None    Rx / DC Orders ED Discharge Orders     None

## 2023-02-04 NOTE — ED Triage Notes (Signed)
Pt called EMS for a fall with left knee pain. Per EMS knee is swollen but no deformities noted. Pt on chronic oxygen at 3L. VS WDL per EMS. Pt's left knee noted to be swollen in triage.

## 2023-02-04 NOTE — ED Provider Triage Note (Signed)
Emergency Medicine Provider Triage Evaluation Note  [name] , a 67 y.o. female  was evaluated in triage.  Pt complains of fall with secondary left knee pain.  Complains of swelling of her knee having generalized weakness.  Denies head injury or loss of consciousness.  Anticoagulated.  Denies any dizziness, chest pain, shortness of breath.  Review of Systems  Positive: Fall, knee pain, generalized weakness Negative: Chest pain, shortness of breath, dizziness  Physical Exam  BP 108/74   Pulse (!) 110   Temp (!) 100.8 F (38.2 C) (Oral)   Resp (!) 22   Ht 5\' 1"  (1.549 m)   Wt 74.4 kg   SpO2 97%   BMI 30.99 kg/m  Gen:   Awake, no distress   Resp:  Normal effort  MSK:   Moves extremities without difficulty, left knee pain Other:  Generalized weakness  Medical Decision Making  Medically screening exam initiated at 5:43 PM.  Appropriate orders placed.  [name] was informed that the remainder of the evaluation will be completed by another provider, this initial triage assessment does not replace that evaluation, and the importance of remaining in the ED until their evaluation is complete.     Pauline Aus, PA-C 02/06/23 1615

## 2023-02-04 NOTE — H&P (Signed)
History and Physical    Patient: [name] IEP:[id number] DOB: 1956-03-29 DOA: 02/04/2023 DOS: the patient was seen and examined on 02/04/2023 PCP: Shelby Dubin, FNP  Patient coming from: Home  Chief Complaint:  Chief Complaint  Patient presents with   Fall   HPI: [name] is a 67 y.o. female with medical history significant of hypertension, hyperlipidemia, COPD on supplemental oxygen at 3 LPM via [place] at baseline, GERD, seizures, type 2 diabetes mellitus, epilepsy, tobacco abuse who presents to the emergency department via EMS from home due to a fall.  Patient is wheelchair-bound and she states that she was trying to get up from her wheelchair in the bathroom when her knees gave up on her and she fell, husband had difficulty getting her up from the floor, so EMS was activated for patient elevating to the ED for further evaluation and management.  Patient also complained of 1 day onset of cough with production of yellow sputum and increased difficulty in being able to take deep breath.  ED Course:  In the emergency department, patient was febrile with a temperature of 100.28F, respiratory rate 22, pulse 110, BP 108/74, O2 sat 97% on 3 LPM of oxygen.  Workup in the ED showed WBC 25.7, hemoglobin 10.2, hematocrit 32.8, MCV 98.5, platelets 251.  BMP was normal except for potassium 3.3, bicarb 21, blood glucose 114, creatinine 1.02.  Troponin x 2 was flat at 5, urinalyses was unimpressive for UTI.  SARS coronavirus 2 was negative, blood culture pending. Chest x-ray showed developing opacity left upper lobe.  Possible acute infiltrate Right knee x-ray showed total knee arthroplasty Right hip x-ray showed no acute osseous abnormality Patient was treated with Tylenol, IV vancomycin and aztreonam were given. Hospitalist was asked to admit patient for further evaluation and management.  Review of Systems: Review of systems as noted in the HPI. All other systems reviewed and are negative.   Past  Medical History:  Diagnosis Date   Allergic rhinitis    Anxiety    Arthritis    "hands and legs and feet" (04/27/2012)   Asthma    Cancer (HCC) 2005   rt arm mole rem cancer   CHF (congestive heart failure) (HCC)    Chronic bronchitis (HCC)    "keep it all the time" (04/27/2012)   Chronic kidney disease    Chronic kidney disease (CKD)    Stage III   COPD (chronic obstructive pulmonary disease) (HCC)    COVID-19 virus infection    03-2021   Depression    PT DENIES   Diverticulosis    DJD (degenerative joint disease)    Emphysema    Epilepsy (HCC)    Epilepsy (HCC)    Esophageal stricture    Exertional dyspnea    GERD (gastroesophageal reflux disease)    Glaucoma    H/O blood clots    Right leg   Heart failure (HCC)    History of COVID-19 03/2021   History of kidney stones    History of nuclear stress test    Myoview 12/25/22: EF 74, no ischemia or infarction, low risk   Hyperlipidemia    Hypertension    IBS (irritable bowel syndrome)    Migraine    Neck pain    Cervical disc   OSA on CPAP    6-7 yrs; pt wears CPAP but has not been able to use it lately due to sinus issues   Oxygen deficiency    USES 3 LITERS  02 CONTINOUS   Pneumonia 2012; 04/27/2012   Recurrent upper respiratory infection (URI)    Right leg DVT (HCC) 1982   groin   Seizures (HCC)    LAST SEIZURE 6 MONTHS OR  LONGER   Sleep apnea    Type II diabetes mellitus (HCC)    No meds since weight loss   UTI (urinary tract infection)    2 weeks ago   Vertigo 10/24/2014   Past Surgical History:  Procedure Laterality Date   ABDOMINAL HYSTERECTOMY  2001   "partial"   BIOPSY  04/16/2021   Procedure: BIOPSY;  Surgeon: Napoleon Form, MD;  Location: [facility];  Service: Endoscopy;;   CARPAL TUNNEL RELEASE  ~ 2008   right   CARPAL TUNNEL RELEASE Right    CATARACT EXTRACTION Bilateral    COLONOSCOPY     COLONOSCOPY WITH ESOPHAGOGASTRODUODENOSCOPY (EGD)     CYSTOSCOPY/URETEROSCOPY/HOLMIUM  LASER/STENT PLACEMENT Left 01/27/2023   Procedure: CYSTOSCOPY LEFT RETROGRADE PYELOGRAM, LEFT URETEROSCOPY, HOLMIUM LASER LITHOTRIPSY, AND LEFT URETERAL STENT PLACEMENT;  Surgeon: Bjorn Pippin, MD;  Location: [facility];  Service: Urology;  Laterality: Left;  60 MINUTES   ESOPHAGOGASTRODUODENOSCOPY (EGD) WITH PROPOFOL N/A 04/16/2021   Procedure: ESOPHAGOGASTRODUODENOSCOPY (EGD) WITH PROPOFOL;  Surgeon: Napoleon Form, MD;  Location: [facility];  Service: Endoscopy;  Laterality: N/A;   KNEE ARTHROSCOPY  1990's   left   NASAL SEPTOPLASTY W/ TURBINOPLASTY Bilateral 05/23/2016   Procedure: NASAL SEPTOPLASTY WITH TURBINATE REDUCTION;  Surgeon: Osborn Coho, MD;  Location: [facility] OR;  Service: ENT;  Laterality: Bilateral;   SHOULDER OPEN ROTATOR CUFF REPAIR  ~ 2010   left   SINOSCOPY     SINUS ENDO WITH FUSION Right 05/23/2016   Procedure: LIMITED RIGHT ENDOSCOPIC SINUS SURGERY;  Surgeon: Osborn Coho, MD;  Location: [facility] OR;  Service: ENT;  Laterality: Right;   TOTAL KNEE ARTHROPLASTY  08/23/2012   right   TOTAL KNEE ARTHROPLASTY  08/23/2012   Procedure: TOTAL KNEE ARTHROPLASTY;  Surgeon: Nilda Simmer, MD;  Location: [facility] OR;  Service: Orthopedics;  Laterality: Right;  RIGHT KNEE ARTHROPLASTY MEDIAL AND LATERAL COMPARTMENTS WITH PATELLA RESURFACING   TUBAL LIGATION  2001   UPPER GASTROINTESTINAL ENDOSCOPY      Social History:  reports that she has been smoking cigarettes. She started smoking about 47 years ago. She has a 20.00 pack-year smoking history. She has never used smokeless tobacco. She reports that she does not drink alcohol and does not use drugs.   Allergies  Allergen Reactions   Amoxicillin Anaphylaxis    Breakout, mouth swells   Azithromycin Swelling and Rash    "swelling all over, hives, thrush"   Cefdinir     Throat swelling after 3rd dose   Chantix [Varenicline] Other (See Comments)    "bad dreams, difficulty breathing"   Doxycycline Hyclate Anaphylaxis and Rash     swelling   Levofloxacin Anaphylaxis    Pt can take moxifloxacin   Penicillins Anaphylaxis, Shortness Of Breath and Rash    "ALMOST DIED, ended up in hospital"    Sulfonamide Derivatives Shortness Of Breath, Swelling and Rash    "break out from head to toe"   Toradol [Ketorolac Tromethamine] Other (See Comments)    Seizure    Clarithromycin Hives    swelling   Nortriptyline Swelling, Rash and Other (See Comments)    Per patient had "kidney problems" on this medication, could not use bathroom (urinary retention)   Paxlovid [Nirmatrelvir-Ritonavir] Swelling    Throat swelling  Codeine Nausea And Vomiting   Fluticasone-Salmeterol Other (See Comments)    REACTION: ulcers in mouth   Triptans Rash    Mouth breaks out and start itching    Family History  Problem Relation Age of Onset   Diabetes Mother    Lung cancer Mother        was a smoker   Stroke Mother    Alcohol abuse Father    Lung cancer Father        was a smoker   Emphysema Father        was a smoker   Heart disease Sister    Other Sister        blood clotting disorder   Diabetes Brother    Seizures Brother    Diabetes Maternal Aunt    Breast cancer Maternal Aunt    Esophageal cancer Maternal Uncle    Diabetes Maternal Uncle    Heart disease Maternal Grandfather    Diabetes Maternal Grandfather    Bipolar disorder Son    Muscular dystrophy Son    Heart disease Son    Heart disease Other        uncle   Colon cancer Neg Hx    Pancreatic cancer Neg Hx    Stomach cancer Neg Hx    Colon polyps Neg Hx    Rectal cancer Neg Hx      Prior to Admission medications   Medication Sig Start Date End Date Taking? Authorizing Provider  albuterol (PROAIR HFA) 108 (90 Base) MCG/ACT inhaler INHALE TWO PUFFS INTO THE LUNGS EVERY 6 HOURS AS NEEDED FOR SHORTNESS OF BREATH Patient taking differently: Inhale 2 puffs into the lungs every 6 (six) hours as needed for shortness of breath or wheezing. 12/18/20   [name],  [name], MD  albuterol (PROVENTIL) (2.5 MG/3ML) 0.083% nebulizer solution USE 1 VIAL IN NEBULIZER EVERY 6 HOURS AS NEEDED FOR SHORTNESS OF BREATH Patient taking differently: Take 2.5 mg by nebulization every 6 (six) hours as needed for wheezing or shortness of breath. 01/18/21   [name], Tawanna Cooler, MD  allopurinol (ZYLOPRIM) 100 MG tablet Take 100 mg by mouth daily.    [provider]  ALPRAZolam Prudy Feeler) 0.25 MG tablet Take 0.25 mg by mouth 2 (two) times daily. 07/14/22   [provider]  Azelastine HCl (ASTEPRO) 0.15 % SOLN Place 1 spray into the nose 2 (two) times daily as needed (Allergies). 01/13/22   Coralyn Helling, MD  cephALEXin (KEFLEX) 500 MG capsule Take 1 capsule (500 mg total) by mouth 4 (four) times daily. 01/27/23   Bjorn Pippin, MD  Cholecalciferol (VITAMIN D3) 50 MCG (2000 UT) capsule TAKE ONE CAPSULE BY MOUTH DAILY Patient taking differently: Take 2,000 Units by mouth daily. 03/27/21   [name], Tawanna Cooler, MD  famotidine (PEPCID) 40 MG tablet Take 40 mg by mouth at bedtime. 07/25/22   [provider]  ferrous sulfate 324 (65 Fe) MG TBEC TAKE ONE TABLET BY MOUTH EVERY OTHER DAY Patient taking differently: Take 324 mg by mouth every other day. 05/20/21   [name], [name], MD  gabapentin (NEURONTIN) 100 MG capsule Take 1 capsule (100 mg total) by mouth at bedtime. Patient taking differently: Take 100 mg by mouth 3 (three) times daily. 11/12/20   [name], Tawanna Cooler, MD  guaiFENesin (MUCINEX) 600 MG 12 hr tablet Take 2 tablets (1,200 mg total) by mouth 2 (two) times daily as needed. Patient taking differently: Take 1,200 mg by mouth 2 (two) times daily as needed for cough or to  loosen phlegm. 07/10/22   Coralyn Helling, MD  ipratropium-albuterol (DUONEB) 0.5-2.5 (3) MG/3ML SOLN Take 3 mLs by nebulization every 6 (six) hours as needed (Wheezing/sob). 12/30/22   [provider]  lamoTRIgine (LAMICTAL) 200 MG tablet TAKE 1 AND 1/2 TABLETS BY  MOUTH TWICE DAILY Patient taking differently: Take 300 mg by mouth 2 (two) times daily. 12/22/22   Windell Norfolk, MD  loratadine (ALLERGY RELIEF) 10 MG tablet Take 1 tablet (10 mg total) by mouth daily. 01/13/23   Parrett, Virgel Bouquet, NP  montelukast (SINGULAIR) 10 MG tablet TAKE ONE TABLET BY MOUTH AT BEDTIME 06/27/22   Coralyn Helling, MD  oxyCODONE-acetaminophen (PERCOCET/ROXICET) 5-325 MG tablet Take 1 tablet by mouth every 6 (six) hours as needed for severe pain. 01/27/23   Bjorn Pippin, MD  OXYGEN Inhale 3 L into the lungs continuous.    [provider]  pantoprazole (PROTONIX) 40 MG tablet Take 1 tablet (40 mg total) by mouth 2 (two) times daily. **PLEASE CALL OFFICE TO SCHEDULE FOLLOW UP 01/29/22   Unk Lightning, PA  QUEtiapine (SEROQUEL) 100 MG tablet TAKE ONE TABLET BY MOUTH AT BEDTIME Patient taking differently: Take 100 mg by mouth at bedtime. 11/24/22   Windell Norfolk, MD  simvastatin (ZOCOR) 40 MG tablet TAKE ONE TABLET BY MOUTH AT BEDTIME Patient taking differently: Take 40 mg by mouth daily at 6 PM. 03/18/21   [name], [name], MD  tiZANidine (ZANAFLEX) 4 MG tablet TAKE ONE TABLET BY MOUTH EVERY 6 HOURS AS NEEDED FOR MUSCLE SPASMS FOR UP TO 30 DOSES Patient not taking: Reported on 01/22/2023 07/10/22   Windell Norfolk, MD  topiramate (TOPAMAX) 100 MG tablet Take 200 mg twice daily Patient taking differently: Take 200 mg by mouth 2 (two) times daily. 09/29/22   Windell Norfolk, MD  TRELEGY ELLIPTA 100-62.5-25 MCG/ACT AEPB INHALE 1 PUFF BY MOUTH INTO THE LUNGS DAILY IN THE AFTERNOON Patient taking differently: Inhale 1 puff into the lungs daily. 12/22/22   Coralyn Helling, MD  triamcinolone cream (KENALOG) 0.1 % Apply 1 Application topically daily as needed (irritation).    [provider]  XARELTO 20 MG TABS tablet Take 20 mg by mouth daily. 07/25/22   [provider]    Physical Exam: BP 90/65 (BP Location: Right Arm)   Pulse 95   Temp 98.4 F (36.9 C)  (Oral)   Resp 20   Ht 5\' 1"  (1.549 m)   Wt 72.4 kg   SpO2 97%   BMI 30.16 kg/m   General: 67 y.o. year-old female well developed well nourished in no acute distress.  Alert and oriented x3. HEENT: NCAT, EOMI Neck: Supple, trachea medial Cardiovascular: Regular rate and rhythm with no rubs or gallops.  No thyromegaly or JVD noted.  No lower extremity edema. 2/4 pulses in all 4 extremities. Respiratory: Scattered rhonchi worse in left upper lobe on auscultation with no wheezes or rales.  Abdomen: Soft, nontender nondistended with normal bowel sounds x4 quadrants. Muskuloskeletal: Left knee tenderness.  No cyanosis, clubbing noted bilaterally Neuro: CN II-XII intact, strength 5/5 x 4, sensation, reflexes intact Skin: No ulcerative lesions noted or rashes Psychiatry: Judgement and insight appear normal. Mood is appropriate for condition and setting          Labs on Admission:  Basic Metabolic Panel: Recent Labs  Lab 02/04/23 1950  NA 136  K 3.3*  CL 107  CO2 21*  GLUCOSE 114*  BUN 23  CREATININE 1.02*  CALCIUM 8.5*   Liver Function Tests:  No results for input(s): "AST", "ALT", "ALKPHOS", "BILITOT", "PROT", "ALBUMIN" in the last 168 hours. No results for input(s): "LIPASE", "AMYLASE" in the last 168 hours. No results for input(s): "AMMONIA" in the last 168 hours. CBC: Recent Labs  Lab 02/04/23 1950  WBC 25.7*  NEUTROABS 22.2*  HGB 10.2*  HCT 32.8*  MCV 98.5  PLT 251   Cardiac Enzymes: No results for input(s): "CKTOTAL", "CKMB", "CKMBINDEX", "TROPONINI" in the last 168 hours.  BNP (last 3 results) No results for input(s): "BNP" in the last 8760 hours.  ProBNP (last 3 results) No results for input(s): "PROBNP" in the last 8760 hours.  CBG: Recent Labs  Lab 02/04/23 2331  GLUCAP 109*    Radiological Exams on Admission: DG Chest Port 1 View  Result Date: 02/04/2023 CLINICAL DATA:  Weakness.  Cough EXAM: PORTABLE CHEST 1 VIEW COMPARISON:  X-ray 09/26/2022  FINDINGS: Developing parenchymal opacity along the left upper thorax. Acute infiltrate is possible and recommend follow-up. No pneumothorax, effusion or edema. Normal cardiopericardial silhouette. IMPRESSION: Developing opacity left upper lobe. Possible acute infiltrate. Recommend follow-up to confirm clearance Electronically Signed   By: Karen Kays M.D.   On: 02/04/2023 19:27   DG Knee 2 Views Right  Result Date: 02/04/2023 CLINICAL DATA:  Pain after fall EXAM: RIGHT KNEE - 2 VIEW COMPARISON:  None Available. FINDINGS: Total knee arthroplasty identified with cemented femoral and tibial component. Patellar button. No fracture or dislocation. No obvious hardware failure. Preserved bone mineralization. No joint effusion on lateral view. IMPRESSION: Total knee arthroplasty Electronically Signed   By: Karen Kays M.D.   On: 02/04/2023 18:06   DG Hip Unilat  With Pelvis 2-3 Views Right  Result Date: 02/04/2023 CLINICAL DATA:  Pain after fall EXAM: DG HIP (WITH OR WITHOUT PELVIS) 3V RIGHT COMPARISON:  01/18/2019 FINDINGS: No fracture or dislocation. Preserved joint spaces. Preserved bone mineralization. Presumed contrast with a some diverticula along the pelvis. Moderate colonic stool. IMPRESSION: No acute osseous abnormality Electronically Signed   By: Karen Kays M.D.   On: 02/04/2023 18:05    EKG: I independently viewed the EKG done and my findings are as followed: Normal sinus rhythm at rate of 98 bpm  Assessment/Plan Present on Admission:  Sepsis due to pneumonia (HCC)  Hypokalemia  Chronic respiratory failure with hypoxia (HCC)  COPD (chronic obstructive pulmonary disease) (HCC)  GERD (gastroesophageal reflux disease)  Mixed hyperlipidemia  Principal Problem:   Sepsis due to pneumonia [facility]) Active Problems:   Mixed hyperlipidemia   COPD (chronic obstructive pulmonary disease) (HCC)   GERD (gastroesophageal reflux disease)   Chronic respiratory failure with hypoxia (HCC)   Seizure  (HCC)   Fall at home, initial encounter   Hypokalemia   Tobacco abuse  Sepsis secondary to left upper lobe pneumonia Patient met sepsis criteria on arrival to the ED due to leukocytosis, fever, tachypnea, tachycardia (met SIRS criteria) and source of infection being the lung Chest x-ray was suggestive of left upper lobe pneumonia Patient was started on vancomycin and aztreonam (due to pharmacy's recommendation based on patient's multiple drug allergies), we shall continue same at this time with plan to de-escalate/discontinue based on blood culture, sputum culture, urine Legionella, strep pneumo and procalcitonin Continue Tylenol as needed Continue Mucinex, incentive spirometry, flutter valve   Fall at home Continue fall precaution Continue PT/OT eval and treat  Hypokalemia K+ 3.3, this will be replenished  COPD (not in acute exacerbation) Continue albuterol, DuoNeb, Singulair  Chronic respiratory failure with hypoxia Continue supplemental  oxygen to maintain O2 sats > 92%  Seizure disorder  Continue Topamax and Lamictal   GERD (gastroesophageal reflux disease) Continue Protonix   Mixed hyperlipidemia Continue Zocor  Left lower extremity DVT Continue Xarelto  Tobacco abuse Patient was counseled on tobacco abuse cessation Patient did not want nicotine patch at this time  DVT prophylaxis: Xarelto  Advance Care Planning: Full code  Consults: None  Family Communication: None at bedside  Severity of Illness: The appropriate patient status for this patient is INPATIENT. Inpatient status is judged to be reasonable and necessary in order to provide the required intensity of service to ensure the patient's safety. The patient's presenting symptoms, physical exam findings, and initial radiographic and laboratory data in the context of their chronic comorbidities is felt to place them at high risk for further clinical deterioration. Furthermore, it is not anticipated that the  patient will be medically stable for discharge from the hospital within 2 midnights of admission.   * I certify that at the point of admission it is my clinical judgment that the patient will require inpatient hospital care spanning beyond 2 midnights from the point of admission due to high intensity of service, high risk for further deterioration and high frequency of surveillance required.*  Author: Frankey Shown, DO 02/04/2023 11:38 PM  For on call review [email or web address].

## 2023-02-04 NOTE — Progress Notes (Signed)
Pharmacy Antibiotic Note  [name] is a 67 y.o. female admitted on 02/04/2023 with pneumonia.  Pharmacy has been consulted for Vancomycin dosing. Patient with numerous allergies to several classes of antibiotics including beta lactams (PNC and ceph), quinolones, doxycycline, and macrolides. Patient to be started on vancomycin/aztreonam for CAP.   Plan: Aztreonam 1gm IV q8h Vancomycin 1500mg  IV x 1 , then 1000mg  IV q24h for eAUC of 480 using Scr 1.02 Monitor for adverse effects, clinical course and deescalation   Height: 5\' 1"  (154.9 cm) Weight: 74.4 kg (164 lb) IBW/kg (Calculated) : 47.8  Temp (24hrs), Avg:100.8 F (38.2 C), Min:100.8 F (38.2 C), Max:100.8 F (38.2 C)  Recent Labs  Lab 02/04/23 1950  WBC 25.7*  CREATININE 1.02*  LATICACIDVEN 0.6    Estimated Creatinine Clearance: 50.7 mL/min (A) (by C-G formula based on SCr of 1.02 mg/dL (H)).    Allergies  Allergen Reactions   Amoxicillin Anaphylaxis    Breakout, mouth swells   Azithromycin Swelling and Rash    "swelling all over, hives, thrush"   Cefdinir     Throat swelling after 3rd dose   Chantix [Varenicline] Other (See Comments)    "bad dreams, difficulty breathing"   Doxycycline Hyclate Anaphylaxis and Rash    swelling   Levofloxacin Anaphylaxis    Pt can take moxifloxacin   Penicillins Anaphylaxis, Shortness Of Breath and Rash    "ALMOST DIED, ended up in hospital"    Sulfonamide Derivatives Shortness Of Breath, Swelling and Rash    "break out from head to toe"   Toradol [Ketorolac Tromethamine] Other (See Comments)    Seizure    Clarithromycin Hives    swelling   Nortriptyline Swelling, Rash and Other (See Comments)    Per patient had "kidney problems" on this medication, could not use bathroom (urinary retention)   Paxlovid [Nirmatrelvir-Ritonavir] Swelling    Throat swelling   Codeine Nausea And Vomiting   Fluticasone-Salmeterol Other (See Comments)    REACTION: ulcers in mouth   Triptans Rash     Mouth breaks out and start itching    Antimicrobials this admission 6/26 vancomycin >>  6/26 Aztreonam >>   Dose adjustments this admission: N/a  Microbiology results:   [name], PharmD, BCPS, FNKF Clinical Pharmacist [place] Please utilize Amion for appropriate phone number to reach the unit pharmacist [facility])  02/04/2023 9:45 PM

## 2023-02-05 ENCOUNTER — Ambulatory Visit: Payer: 59 | Admitting: Urology

## 2023-02-05 DIAGNOSIS — J189 Pneumonia, unspecified organism: Secondary | ICD-10-CM | POA: Diagnosis not present

## 2023-02-05 DIAGNOSIS — Z09 Encounter for follow-up examination after completed treatment for conditions other than malignant neoplasm: Secondary | ICD-10-CM

## 2023-02-05 DIAGNOSIS — J449 Chronic obstructive pulmonary disease, unspecified: Secondary | ICD-10-CM | POA: Diagnosis not present

## 2023-02-05 DIAGNOSIS — E876 Hypokalemia: Secondary | ICD-10-CM | POA: Diagnosis not present

## 2023-02-05 DIAGNOSIS — W19XXXA Unspecified fall, initial encounter: Secondary | ICD-10-CM | POA: Diagnosis not present

## 2023-02-05 DIAGNOSIS — N200 Calculus of kidney: Secondary | ICD-10-CM

## 2023-02-05 LAB — EXPECTORATED SPUTUM ASSESSMENT W GRAM STAIN, RFLX TO RESP C

## 2023-02-05 LAB — COMPREHENSIVE METABOLIC PANEL
ALT: 10 U/L (ref 0–44)
AST: 12 U/L — ABNORMAL LOW (ref 15–41)
Albumin: 2.7 g/dL — ABNORMAL LOW (ref 3.5–5.0)
Alkaline Phosphatase: 85 U/L (ref 38–126)
Anion gap: 8 (ref 5–15)
BUN: 19 mg/dL (ref 8–23)
CO2: 19 mmol/L — ABNORMAL LOW (ref 22–32)
Calcium: 8.4 mg/dL — ABNORMAL LOW (ref 8.9–10.3)
Chloride: 109 mmol/L (ref 98–111)
Creatinine, Ser: 0.84 mg/dL (ref 0.44–1.00)
GFR, Estimated: >60 mL/min (ref >60)
Glucose, Bld: 94 mg/dL (ref 70–99)
Potassium: 3.8 mmol/L (ref 3.5–5.1)
Sodium: 136 mmol/L (ref 135–145)
Total Bilirubin: 0.5 mg/dL (ref 0.3–1.2)
Total Protein: 7 g/dL (ref 6.5–8.1)

## 2023-02-05 LAB — CULTURE, RESPIRATORY W GRAM STAIN

## 2023-02-05 LAB — CBC
HCT: 29.6 % — ABNORMAL LOW (ref 36.0–46.0)
Hemoglobin: 9.2 g/dL — ABNORMAL LOW (ref 12.0–15.0)
MCH: 30.7 pg (ref 26.0–34.0)
MCHC: 31.1 g/dL (ref 30.0–36.0)
MCV: 98.7 fL (ref 80.0–100.0)
Platelets: 225 10*3/uL (ref 150–400)
RBC: 3 MIL/uL — ABNORMAL LOW (ref 3.87–5.11)
RDW: 15.2 % (ref 11.5–15.5)
WBC: 18.9 10*3/uL — ABNORMAL HIGH (ref 4.0–10.5)
nRBC: 0 % (ref 0.0–0.2)

## 2023-02-05 LAB — MAGNESIUM: Magnesium: 2.1 mg/dL (ref 1.7–2.4)

## 2023-02-05 LAB — PHOSPHORUS: Phosphorus: 3.1 mg/dL (ref 2.5–4.6)

## 2023-02-05 LAB — PROCALCITONIN: Procalcitonin: 0.15 ng/mL

## 2023-02-05 LAB — STREP PNEUMONIAE URINARY ANTIGEN: Strep Pneumo Urinary Antigen: NEGATIVE

## 2023-02-05 MED ORDER — OXYCODONE-ACETAMINOPHEN 5-325 MG PO TABS
1.0000 | ORAL_TABLET | Freq: Four times a day (QID) | ORAL | Status: DC | PRN
  Administered 2023-02-05 – 2023-02-06 (×3): 1 via ORAL
  Filled 2023-02-05 (×3): qty 1

## 2023-02-05 MED ORDER — MELATONIN 3 MG PO TABS
6.0000 mg | ORAL_TABLET | Freq: Once | ORAL | Status: AC
  Administered 2023-02-05: 6 mg via ORAL
  Filled 2023-02-05: qty 2

## 2023-02-05 MED ORDER — MOXIFLOXACIN HCL 400 MG PO TABS
400.0000 mg | ORAL_TABLET | Freq: Every day | ORAL | Status: DC
  Administered 2023-02-05: 400 mg via ORAL
  Filled 2023-02-05 (×4): qty 1

## 2023-02-05 NOTE — Hospital Course (Signed)
[name] is a 67 y.o. female with medical history significant of hypertension, hyperlipidemia, COPD on supplemental oxygen at 3 LPM via [place] at baseline, GERD, type 2 diabetes mellitus, epilepsy, and tobacco abuse who presented to the emergency department via EMS from home due to a fall, and was ultimately admitted for sepsis secondary to pneumonia.  XR evaluation for her fall was inconclusive. However, 6/26 CXR on admission showed a developing opacity in the LUL, alongside fever of 100.8, tachypnea, tachycardia and leukocytosis to 25.7k. She was ultimately started on an antibiotic regimen of Vancomycin and Aztreonam after consulting pharmacy due to her anaphylactic allergies listed to multiple antibiotics.   On the HD1 her vitals have stabilized and her leukocytosis has improved to 19k. She remains at her baseline supplemental O2 requirement of 3L via [place].

## 2023-02-05 NOTE — Evaluation (Signed)
Occupational Therapy Evaluation Patient Details Name: [name] MRN: [id number] DOB: 20-Jan-1956 Today's Date: 02/05/2023   History of Present Illness [name] is a 67 y.o. female with medical history significant of hypertension, hyperlipidemia, COPD on supplemental oxygen at 3 LPM via [place] at baseline, GERD, seizures, type 2 diabetes mellitus, epilepsy, tobacco abuse who presents to the emergency department via EMS from home due to a fall.  Patient is wheelchair-bound and she states that she was trying to get up from her wheelchair in the bathroom when her knees gave up on her and she fell, husband had difficulty getting her up from the floor, so EMS was activated for patient elevating to the ED for further evaluation and management.  Patient also complained of 1 day onset of cough with production of yellow sputum and increased difficulty in being able to take deep breath. (per DO)   Clinical Impression   Pt agreeable to OT and PT co-evaluation. Pt mostly using w/c for mobility at baseline but does transfer to the toilet with the RW and ambulates with therapy staff. Pt demonstrated weakness but mod I to supervision assist for most mobility. Pt on 3 LPM supplemental O2 throughout session with saturation above 90% SpO2 when tested. Pt is independent with seated ADL's with generalized B UE weakness. Pt does report 10 falls in the past 6 months and would likely benefit from a safety assessment from a home health OT. Pt was left in the chair with call bell within reach. Pt is not recommended for further acute OT services and will be discharged to care of nursing staff for remaining length of stay.       Recommendations for follow up therapy are one component of a multi-disciplinary discharge planning process, led by the attending physician.  Recommendations may be updated based on patient status, additional functional criteria and insurance authorization.   Assistance Recommended at Discharge  Intermittent Supervision/Assistance  Patient can return home with the following A little help with walking and/or transfers;Assistance with cooking/housework;Assist for transportation;Help with stairs or ramp for entrance    Functional Status Assessment  Patient has had a recent decline in their functional status and demonstrates the ability to make significant improvements in function in a reasonable and predictable amount of time.  Equipment Recommendations  None recommended by OT           Precautions / Restrictions Precautions Precautions: Fall Restrictions Weight Bearing Restrictions: No      Mobility Bed Mobility Overal bed mobility: Modified Independent                  Transfers Overall transfer level: Needs assistance Equipment used: Rolling walker (2 wheels) Transfers: Sit to/from Stand, Bed to chair/wheelchair/BSC Sit to Stand: Modified independent (Device/Increase time)     Step pivot transfers: Supervision, Modified independent (Device/Increase time), Min guard     General transfer comment: Min G for step pivot without RW. More mod I with RW.      Balance Overall balance assessment: Needs assistance Sitting-balance support: No upper extremity supported, Feet supported Sitting balance-Leahy Scale: Good Sitting balance - Comments: seated at EOB   Standing balance support: Bilateral upper extremity supported, During functional activity Standing balance-Leahy Scale: Fair Standing balance comment: fair with RW                           ADL either performed or assessed with clinical judgement   ADL Overall ADL's :  Needs assistance/impaired     Grooming: Standing;Wash/dry face;Supervision/safety Grooming Details (indicate cue type and reason): Pt able to stand at the sink and wash her hands with RW available.     Lower Body Bathing: Modified independent;Sitting/lateral leans   Upper Body Dressing : Modified independent;Sitting    Lower Body Dressing: Modified independent;Sitting/lateral leans   Toilet Transfer: Supervision/safety;Modified Independent;Rolling walker (2 wheels);Ambulation Toilet Transfer Details (indicate cue type and reason): Pt ambulated from chair to toilet and back with mod I to supervision using RW. Toileting- Clothing Manipulation and Hygiene: Modified independent;Sitting/lateral lean Toileting - Clothing Manipulation Details (indicate cue type and reason): Pt completed peri-care without assist seated at the toilet.     Functional mobility during ADLs: Supervision/safety;Min guard;Rolling walker (2 wheels) General ADL Comments: Mildly unsteady at times with RW while ambulating to door and back to chair with RW.     Vision Baseline Vision/History: 1 Wears glasses Ability to See in Adequate Light: 1 Impaired Patient Visual Report: No change from baseline Vision Assessment?: No apparent visual deficits                Pertinent Vitals/Pain Pain Assessment Pain Assessment: Faces Faces Pain Scale: No hurt     Hand Dominance Right   Extremity/Trunk Assessment Upper Extremity Assessment Upper Extremity Assessment: Generalized weakness   Lower Extremity Assessment Lower Extremity Assessment: Defer to PT evaluation   Cervical / Trunk Assessment Cervical / Trunk Assessment: Normal   Communication Communication Communication: No difficulties   Cognition Arousal/Alertness: Awake/alert Behavior During Therapy: WFL for tasks assessed/performed Overall Cognitive Status: Within Functional Limits for tasks assessed                                                        Home Living Family/patient expects to be discharged to:: Private residence Living Arrangements: Children;Spouse/significant other Available Help at Discharge: Family;Available 24 hours/day Type of Home: House Home Access: Ramped entrance     Home Layout: One level     Bathroom Shower/Tub:  Chief Strategy Officer: Standard Bathroom Accessibility: Yes How Accessible: Accessible via walker Home Equipment: Rolling Walker (2 wheels);BSC/3in1;Shower seat;Grab bars - tub/shower;Wheelchair - manual;Hospital bed          Prior Functioning/Environment Prior Level of Function : Needs assist;History of Falls (last six months) (10 falls reported in the past 6 months.)       Physical Assist : ADLs (physical);Mobility (physical) Mobility (physical): Gait ADLs (physical): IADLs Mobility Comments: Pt ambulates with therapy staff at home. Pt reports not ambulating withing therapy, but pt did later report that she parks her w/c at the door of the bathroom and uses the RW to ambulate to the toilet. Pt mostly using w/c for mobility. ADLs Comments: Pt reports typically being independent for ADL's with assist for IADL's. Family available to help with ADL's if needed.                                Co-evaluation PT/OT/SLP Co-Evaluation/Treatment: Yes Reason for Co-Treatment: To address functional/ADL transfers   OT goals addressed during session: ADL's and self-care                       End of Session Equipment Utilized During Treatment:  Rolling walker (2 wheels);Oxygen  Activity Tolerance: Patient tolerated treatment well Patient left: in chair;with call bell/phone within reach  OT Visit Diagnosis: Unsteadiness on feet (R26.81);Other abnormalities of gait and mobility (R26.89);Muscle weakness (generalized) (M62.81)                Time: 2841-3244 OT Time Calculation (min): 16 min Charges:  OT General Charges $OT Visit: 1 Visit OT Evaluation $OT Eval Low Complexity: 1 Low  Justyn Langham OT, MOT  Danie Chandler 02/05/2023, 8:38 AM

## 2023-02-05 NOTE — Plan of Care (Signed)
  Problem: Acute Rehab PT Goals(only PT should resolve) Goal: Pt Will Go Supine/Side To Sit Outcome: Progressing Flowsheets (Taken 02/05/2023 1217) Pt will go Supine/Side to Sit: Independently Goal: Patient Will Transfer Sit To/From Stand Outcome: Progressing Flowsheets (Taken 02/05/2023 1217) Patient will transfer sit to/from stand:  with modified independence  with supervision Goal: Pt Will Transfer Bed To Chair/Chair To Bed Outcome: Progressing Flowsheets (Taken 02/05/2023 1217) Pt will Transfer Bed to Chair/Chair to Bed:  with modified independence  with supervision Goal: Pt Will Ambulate Outcome: Progressing Flowsheets (Taken 02/05/2023 1217) Pt will Ambulate:  50 feet  with supervision  with rolling walker   12:18 PM, 02/05/23 [name], MPT Physical Therapist with [facility] 336 [phone] office [phone] mobile phone

## 2023-02-05 NOTE — TOC Initial Note (Signed)
Transition of Care [facility]) - Initial/Assessment Note    Patient Details  Name: [name] MRN: [id number] Date of Birth: 01/13/1956  Transition of Care [facility]) CM/SW Contact:    Karn Cassis, LCSW Phone Number: 02/05/2023, 1:39 PM  Clinical Narrative: Pt admitted for sepsis due to pneumonia. Pt's husband reports their children and their spouses live with them. Pt's daughter is trying to become pt's CNA. Pt is on 3L home O2 (he believes through [employer]). Daughter states someone comes out to home for PT, but they are unsure on agency. LCSW spoke with [facility] and they confirm they are active with pt. Her next appointment is Monday. No orders needed. TOC will continue to follow. No needs reported at this time.                 Expected Discharge Plan: Home w Home Health Services Barriers to Discharge: Continued Medical Work up   Patient Goals and CMS Choice Patient states their goals for this hospitalization and ongoing recovery are:: return home   Choice offered to / list presented to : Spouse [place] ownership interest in [facility].provided to::  (n/a)    Expected Discharge Plan and Services In-house Referral: Clinical Social Work   Post Acute Care Choice: Resumption of Svcs/PTA Provider Living arrangements for the past 2 months: Single Family Home                                      Prior Living Arrangements/Services Living arrangements for the past 2 months: Single Family Home Lives with:: Spouse, Adult Children Patient language and need for interpreter reviewed:: Yes Do you feel safe going back to the place where you live?: Yes      Need for Family Participation in Patient Care: Yes (Comment) Care giver support system in place?: Yes (comment) Current home services: DME, Home PT (walker, electric wheelchair, hospital bed, O2) Criminal Activity/Legal Involvement Pertinent to Current Situation/Hospitalization: No - Comment  as needed  Activities of Daily Living Home Assistive Devices/Equipment: Cane (specify quad or straight), CBG Meter, CPAP, Dentures (specify type), Electric scooter, Eyeglasses, Grab bars in shower, Grab bars around toilet, Hospital bed, Nebulizer, Oxygen, Shower chair with back, Environmental consultant (specify type) ADL Screening (condition at time of admission) Patient's cognitive ability adequate to safely complete daily activities?: Yes Is the patient deaf or have difficulty hearing?: Yes Does the patient have difficulty seeing, even when wearing glasses/contacts?: No Does the patient have difficulty concentrating, remembering, or making decisions?: No Patient able to express need for assistance with ADLs?: Yes Does the patient have difficulty dressing or bathing?: No Independently performs ADLs?: Yes (appropriate for developmental age) Communication: Independent Dressing (OT): Independent Is this a change from baseline?: Pre-admission baseline Grooming: Independent Is this a change from baseline?: Pre-admission baseline Feeding: Independent Bathing: Independent Is this a change from baseline?: Pre-admission baseline Toileting: Independent Is this a change from baseline?: Pre-admission baseline In/Out Bed: Independent Is this a change from baseline?: Pre-admission baseline Walks in Home: Independent Is this a change from baseline?: Pre-admission baseline Does the patient have difficulty walking or climbing stairs?: Yes Weakness of Legs: Both Weakness of Arms/Hands: Both  Permission Sought/Granted                  Emotional Assessment         Alcohol / Substance Use: Not Applicable Psych Involvement: No (  comment)  Admission diagnosis:  Left upper lobe pneumonia [J18.9] Patient Active Problem List   Diagnosis Date Noted   Sepsis due to pneumonia (HCC) 02/04/2023   Tobacco abuse 02/04/2023   Kidney stones 01/16/2023   History of nuclear stress test 12/25/2022   History of DVT  (deep vein thrombosis) 12/23/2022   Preoperative cardiovascular examination 12/22/2022   CAP (community acquired pneumonia) 08/06/2022   Hypokalemia 08/06/2022   Candida infection, esophageal (HCC)    Ventricular diastolic dysfunction determined by echocardiography 04/12/2021   Multiple allergies 04/12/2021   Pressure injury of ankle, stage 2 (HCC) 01/31/2021   Dehydration 01/18/2021   Aspiration pneumonia (HCC) 12/30/2020   Dysphagia 12/20/2020   Radiculopathy affecting upper extremity 11/12/2020   Shortness of breath 07/26/2019   Fall at home, initial encounter 01/18/2019   Chronic pain of left knee 12/06/2018   Tobacco dependence 07/02/2018   Elevated serum creatinine 06/01/2018   Generalized weakness 05/15/2017   Chronic bronchitis (HCC) 01/14/2017   Claudication (HCC) 04/09/2016   Deviated septum 02/06/2016    Class: Chronic   Nasal turbinate hypertrophy 02/06/2016   Seizure disorder (HCC) 11/12/2015   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 05/23/2015   Rhinitis, chronic 04/25/2015   Dizziness 10/24/2014   Seizure (HCC)    Pulmonary hypertension (HCC) 08/06/2013   (HFpEF) heart failure with preserved ejection fraction (HCC) 08/06/2013   BPPV (benign paroxysmal positional vertigo) 03/09/2013   Chronic respiratory failure with hypoxia (HCC) 11/01/2012   Left shoulder pain 06/27/2012   Headache 05/09/2012   Anxiety state 02/13/2010   Obstructive sleep apnea 02/19/2009   Vitamin D deficiency 01/21/2008   OSTEOPENIA 11/24/2007   Irritable bowel syndrome 11/17/2007   COPD (chronic obstructive pulmonary disease) (HCC) 09/24/2007   Mixed hyperlipidemia 10/08/2006   OBESITY, NOS  BMI 33.8 10/08/2006   GLAUCOMA 10/08/2006   GERD (gastroesophageal reflux disease) 10/08/2006   HERNIA, HIATAL, NONCONGENITAL 10/08/2006   INCONTINENCE, STRESS, FEMALE 10/08/2006   DJD (degenerative joint disease) of knee 10/08/2006   PCP:  Shelby Dubin, FNP Pharmacy:    [facility] [address] [place] - [address] [phone] [address] [address] Phone: [phone] Fax: [phone]  [id number] - [place],  - [address] [place] [address] Phone: [phone] Fax: [phone]     Social Determinants of Health (SDOH) Social History: SDOH Screenings   Food Insecurity: No Food Insecurity (02/04/2023)  Housing: Low Risk  (02/04/2023)  Transportation Needs: No Transportation Needs (02/04/2023)  Utilities: Not At Risk (02/04/2023)  Alcohol Screen: Low Risk  (07/11/2021)  Depression (PHQ2-9): Medium Risk (05/24/2021)  Financial Resource Strain: High Risk (09/13/2021)  Physical Activity: Inactive (08/13/2021)  Social Connections: Moderately Integrated (08/13/2021)  Tobacco Use: High Risk (02/04/2023)   SDOH Interventions:     Readmission Risk Interventions     No data to display

## 2023-02-05 NOTE — Evaluation (Signed)
Physical Therapy Evaluation Patient Details Name: [name] MRN: [id number] DOB: [birth year] Today's Date: 02/05/2023  History of Present Illness  [name] is a 67 y.o. female with medical history significant of hypertension, hyperlipidemia, COPD on supplemental oxygen at 3 LPM via [place] at baseline, GERD, seizures, type 2 diabetes mellitus, epilepsy, tobacco abuse who presents to the emergency department via EMS from home due to a fall.  Patient is wheelchair-bound and she states that she was trying to get up from her wheelchair in the bathroom when her knees gave up on her and she fell, husband had difficulty getting her up from the floor, so EMS was activated for patient elevating to the ED for further evaluation and management.  Patient also complained of 1 day onset of cough with production of yellow sputum and increased difficulty in being able to take deep breath.   Clinical Impression  Patient demonstrates good return for sitting up at bedside, transferring to/from chair and commode in bathroom, ambulating in room using RW without loss of balance.  Patient on 3 LPM with SpO2 at 96% when ambulating in room and limited mostly due to c/o fatigue.  Patient tolerated sitting up in chair after therapy.  Patient will benefit from continued skilled physical therapy in hospital and recommended venue below to increase strength, balance, endurance for safe ADLs and gait.         Recommendations for follow up therapy are one component of a multi-disciplinary discharge planning process, led by the attending physician.  Recommendations may be updated based on patient status, additional functional criteria and insurance authorization.  Follow Up Recommendations       Assistance Recommended at Discharge Set up Supervision/Assistance  Patient can return home with the following  A little help with walking and/or transfers;A little help with bathing/dressing/bathroom;Help with stairs or ramp for  entrance;Assistance with cooking/housework    Equipment Recommendations None recommended by PT  Recommendations for Other Services       Functional Status Assessment Patient has had a recent decline in their functional status and demonstrates the ability to make significant improvements in function in a reasonable and predictable amount of time.     Precautions / Restrictions Precautions Precautions: Fall Restrictions Weight Bearing Restrictions: No      Mobility  Bed Mobility Overal bed mobility: Modified Independent                  Transfers Overall transfer level: Needs assistance Equipment used: Rolling walker (2 wheels) Transfers: Sit to/from Stand, Bed to chair/wheelchair/BSC Sit to Stand: Modified independent (Device/Increase time)   Step pivot transfers: Supervision, Modified independent (Device/Increase time), Min guard       General transfer comment: good return for transferring to/from chair leaning on armrest of chair without AD and using RW, good return for transferring to/from commode in bathroom without loss of balance using RW    Ambulation/Gait Ambulation/Gait assistance: Supervision, Min guard Gait Distance (Feet): 25 Feet Assistive device: Rolling walker (2 wheels) Gait Pattern/deviations: Decreased step length - right, Decreased step length - left, Decreased stride length Gait velocity: decreased     General Gait Details: slow labored cadence without loss of balance, limited mostly due to faigue, on 3 LPM with SpO2 at 96%  Stairs            Wheelchair Mobility    Modified Rankin (Stroke Patients Only)       Balance Overall balance assessment: Needs assistance Sitting-balance support: Feet supported, No  upper extremity supported Sitting balance-Leahy Scale: Good Sitting balance - Comments: seated at EOB   Standing balance support: During functional activity, No upper extremity supported Standing balance-Leahy Scale:  Poor Standing balance comment: fair/good using RW                             Pertinent Vitals/Pain Pain Assessment Pain Assessment: No/denies pain    Home Living Family/patient expects to be discharged to:: Private residence Living Arrangements: Children;Spouse/significant other Available Help at Discharge: Family;Available 24 hours/day Type of Home: House Home Access: Ramped entrance       Home Layout: One level Home Equipment: Rolling Walker (2 wheels);BSC/3in1;Shower seat;Grab bars - tub/shower;Wheelchair - manual;Hospital bed      Prior Function Prior Level of Function : Needs assist;History of Falls (last six months)       Physical Assist : ADLs (physical);Mobility (physical) Mobility (physical): Gait ADLs (physical): IADLs Mobility Comments: Pt ambulates with therapy staff at home. Pt reports not ambulating withing therapy, but pt did later report that she parks her w/c at the door of the bathroom and uses the RW to ambulate to the toilet. Pt mostly using w/c for mobility. ADLs Comments: Pt reports typically being independent for ADL's with assist for IADL's. Family available to help with ADL's if needed.     Hand Dominance   Dominant Hand: Right    Extremity/Trunk Assessment   Upper Extremity Assessment Upper Extremity Assessment: Defer to OT evaluation    Lower Extremity Assessment Lower Extremity Assessment: Generalized weakness    Cervical / Trunk Assessment Cervical / Trunk Assessment: Normal  Communication   Communication: No difficulties  Cognition Arousal/Alertness: Awake/alert Behavior During Therapy: WFL for tasks assessed/performed Overall Cognitive Status: Within Functional Limits for tasks assessed                                          General Comments      Exercises     Assessment/Plan    PT Assessment Patient needs continued PT services  PT Problem List Decreased strength;Decreased activity  tolerance;Decreased balance;Decreased mobility       PT Treatment Interventions DME instruction;Gait training;Stair training;Functional mobility training;Therapeutic activities;Therapeutic exercise;Patient/family education;Balance training    PT Goals (Current goals can be found in the Care Plan section)  Acute Rehab PT Goals Patient Stated Goal: return home with family to assist PT Goal Formulation: With patient Time For Goal Achievement: 02/09/23 Potential to Achieve Goals: Good    Frequency Min 3X/week     Co-evaluation PT/OT/SLP Co-Evaluation/Treatment: Yes Reason for Co-Treatment: To address functional/ADL transfers PT goals addressed during session: Mobility/safety with mobility;Balance;Proper use of DME         AM-PAC PT "6 Clicks" Mobility  Outcome Measure Help needed turning from your back to your side while in a flat bed without using bedrails?: None Help needed moving from lying on your back to sitting on the side of a flat bed without using bedrails?: None Help needed moving to and from a bed to a chair (including a wheelchair)?: A Little Help needed standing up from a chair using your arms (e.g., wheelchair or bedside chair)?: A Little Help needed to walk in hospital room?: A Little Help needed climbing 3-5 steps with a railing? : A Little 6 Click Score: 20    End of Session Equipment Utilized  During Treatment: Oxygen Activity Tolerance: Patient tolerated treatment well;Patient limited by fatigue Patient left: in chair;with call bell/phone within reach Nurse Communication: Mobility status PT Visit Diagnosis: Unsteadiness on feet (R26.81);Other abnormalities of gait and mobility (R26.89);Muscle weakness (generalized) (M62.81)    Time: 4098-1191 PT Time Calculation (min) (ACUTE ONLY): 22 min   Charges:   PT Evaluation $PT Eval Moderate Complexity: 1 Mod PT Treatments $Therapeutic Activity: 8-22 mins        12:16 PM, 02/05/23 Ocie Bob,  MPT Physical Therapist with [facility] [phone] office [phone] mobile phone

## 2023-02-05 NOTE — Progress Notes (Signed)
PROGRESS NOTE    [name]  WJX:[id number] DOB: 18-Jul-1956 DOA: 02/04/2023 PCP: Shelby Dubin, FNP   Brief Narrative:  BLUE RUGGERIO is a 67 y.o. female with medical history significant of hypertension, hyperlipidemia, COPD on supplemental oxygen at 3 LPM via [place] at baseline, GERD, type 2 diabetes mellitus, epilepsy, and tobacco abuse who presented to the emergency department via EMS from home due to a fall, and was ultimately admitted for sepsis secondary to pneumonia.  XR evaluation for her fall was inconclusive. However, 6/26 CXR on admission showed a developing opacity in the LUL, alongside fever of 100.8, tachypnea, tachycardia and leukocytosis to 25.7k. She was ultimately started on an antibiotic regimen of Vancomycin and Aztreonam after consulting pharmacy due to her anaphylactic allergies listed to multiple antibiotics.   On the HD1 her vitals have stabilized and her leukocytosis has improved to 19k. She remains at her baseline supplemental O2 requirement of 3L via [place].     Assessment & Plan:   Principal Problem:   Sepsis due to pneumonia [facility]) Active Problems:   Mixed hyperlipidemia   COPD (chronic obstructive pulmonary disease) (HCC)   GERD (gastroesophageal reflux disease)   Chronic respiratory failure with hypoxia (HCC)   Seizure (HCC)   Fall at home, initial encounter   Hypokalemia   Tobacco abuse  Assessment and Plan:  Left upper lobe pneumonia Patient met sepsis criteria on arrival to the ED due to leukocytosis, fever, tachypnea, tachycardia and CXR findings of a developing opacity in the LUL. Vitals have normalized on HD1, leukocytosis has improved to 18.9k, and pt feels "somewhat better." She remains at her baseline supplemental O2 requirement of 3L. Patient was started on vancomycin and aztreonam (due to pharmacy's recommendation based on patient's multiple drug allergies). Procalcitonin low at 0.15. Strep pneumo ur antigen negative. Legionella pending. Will develop  appropriate PO antibiotic regimen to transition pt to alongside  pharmacy.  - Vanc and Aztreonam for now, as above - Continue Mucinex, incentive spirometry, flutter valve    Fall at home Continue fall precaution Continue PT/OT eval and treat   COPD (not in acute exacerbation) Remains at her baseline supplemental O2 requirement of 3L via [place].  - Continue albuterol q6 prn - Continue DuoNeb q6 prn - Continue Singulair 10mg  qhs   Chronic respiratory failure with hypoxia - Continue supplemental oxygen to maintain O2 sats > 92%   Epilepsy - Continue Topamax and Lamictal   GERD (gastroesophageal reflux disease) - Continue Protonix   Mixed hyperlipidemia - Continue Zocor   Left lower extremity DVT - Continue Xarelto   Tobacco abuse Patient was counseled on tobacco abuse cessation Patient did not want nicotine patch at this time  Obesity Body mass index is 30.16 kg/m.  - Counseled on outpatient diet and lifestyle modifications  DVT prophylaxis: Xarelto 20mg  Code Status: Full Family Communication: Will ask pt if she would like family updated Disposition Plan: Home   Consultants:  None  Procedures:  None  Antimicrobials:  Vancomycin and Aztreonam   Subjective: Patient seen and evaluated today with no new acute complaints or concerns. No acute concerns or events noted overnight. [name] notes she is feeling a little better today, cough mildly improved, denies new concerns, subjective fevers or worsened SOB. She remains on 3L [place] (her home baseline).  Objective: Vitals:   02/04/23 2230 02/04/23 2327 02/05/23 0349 02/05/23 0734  BP:  90/65 (!) 99/57 97/60  Pulse: 94 95 91 85  Resp: 13 20 20  16  Temp:  98.4 F (36.9 C) 98.2 F (36.8 C) 98.6 F (37 C)  TempSrc:  Oral Oral Oral  SpO2: 100% 97% 100% 100%  Weight:  72.4 kg    Height:  5\' 1"  (1.549 m)      Intake/Output Summary (Last 24 hours) at 02/05/2023 1108 Last data filed at 02/05/2023 0500 Gross per 24 hour   Intake 1270.63 ml  Output 500 ml  Net 770.63 ml   Filed Weights   02/04/23 1728 02/04/23 2327  Weight: 74.4 kg 72.4 kg    Examination:  General exam: Appears calm and comfortable, obese Respiratory system: Mild expiratory wheezes. Respiratory effort normal. 3L [place]. Cardiovascular system: S1 & S2 heard, RRR.  Gastrointestinal system: Abdomen is soft, bowel sounds present Central nervous system: Alert and awake Extremities: No edema Skin: No significant lesions noted Psychiatry: Flat affect.   Data Reviewed: I have personally reviewed following labs and imaging studies  CBC: Recent Labs  Lab 02/04/23 1950 02/05/23 0518  WBC 25.7* 18.9*  NEUTROABS 22.2*  --   HGB 10.2* 9.2*  HCT 32.8* 29.6*  MCV 98.5 98.7  PLT 251 225   Basic Metabolic Panel: Recent Labs  Lab 02/04/23 1950 02/05/23 0518  NA 136 136  K 3.3* 3.8  CL 107 109  CO2 21* 19*  GLUCOSE 114* 94  BUN 23 19  CREATININE 1.02* 0.84  CALCIUM 8.5* 8.4*  MG  --  2.1  PHOS  --  3.1   GFR: Estimated Creatinine Clearance: 60.7 mL/min (by C-G formula based on SCr of 0.84 mg/dL). Liver Function Tests: Recent Labs  Lab 02/05/23 0518  AST 12*  ALT 10  ALKPHOS 85  BILITOT 0.5  PROT 7.0  ALBUMIN 2.7*   No results for input(s): "LIPASE", "AMYLASE" in the last 168 hours. No results for input(s): "AMMONIA" in the last 168 hours. Coagulation Profile: Recent Labs  Lab 02/04/23 1950  INR 1.1   Cardiac Enzymes: No results for input(s): "CKTOTAL", "CKMB", "CKMBINDEX", "TROPONINI" in the last 168 hours. BNP (last 3 results) No results for input(s): "PROBNP" in the last 8760 hours. HbA1C: No results for input(s): "HGBA1C" in the last 72 hours. CBG: Recent Labs  Lab 02/04/23 2331  GLUCAP 109*   Lipid Profile: No results for input(s): "CHOL", "HDL", "LDLCALC", "TRIG", "CHOLHDL", "LDLDIRECT" in the last 72 hours. Thyroid Function Tests: No results for input(s): "TSH", "T4TOTAL", "FREET4", "T3FREE",  "THYROIDAB" in the last 72 hours. Anemia Panel: No results for input(s): "VITAMINB12", "FOLATE", "FERRITIN", "TIBC", "IRON", "RETICCTPCT" in the last 72 hours. Sepsis Labs: Recent Labs  Lab 02/04/23 1950 02/05/23 0518  PROCALCITON  --  0.15  LATICACIDVEN 0.6  --     Recent Results (from the past 240 hour(s))  Expectorated Sputum Assessment w Gram Stain, Rflx to Resp Cult     Status: None   Collection Time: 02/04/23 12:43 AM   Specimen: Expectorated Sputum  Result Value Ref Range Status   Specimen Description EXPECTORATED SPUTUM  Final   Special Requests NONE  Final   Sputum evaluation   Final    THIS SPECIMEN IS ACCEPTABLE FOR SPUTUM CULTURE Performed at [facility], [address], [place], [place] [address]    Report Status 02/05/2023 FINAL  Final  SARS Coronavirus 2 by RT PCR (hospital order, performed in [facility] hospital lab) *cepheid single result test* Anterior Nasal Swab     Status: None   Collection Time: 02/04/23  7:17 PM   Specimen: Anterior Nasal Swab  Result  Value Ref Range Status   SARS Coronavirus 2 by RT PCR NEGATIVE NEGATIVE Final    Comment: (NOTE) SARS-CoV-2 target nucleic acids are NOT DETECTED.  The SARS-CoV-2 RNA is generally detectable in upper and lower respiratory specimens during the acute phase of infection. The lowest concentration of SARS-CoV-2 viral copies this assay can detect is 250 copies / mL. A negative result does not preclude SARS-CoV-2 infection and should not be used as the sole basis for treatment or other patient management decisions.  A negative result may occur with improper specimen collection / handling, submission of specimen other than nasopharyngeal swab, presence of viral mutation(s) within the areas targeted by this assay, and inadequate number of viral copies (<250 copies / mL). A negative result must be combined with clinical observations, patient history, and epidemiological information.  Fact Sheet for Patients:    RoadLapTop.co.za  Fact Sheet for Healthcare Providers: http://kim-miller.com/  This test is not yet approved or  cleared by the [place] FDA and has been authorized for detection and/or diagnosis of SARS-CoV-2 by FDA under an Emergency Use Authorization (EUA).  This EUA will remain in effect (meaning this test can be used) for the duration of the COVID-19 declaration under Section 564(b)(1) of the Act, 21 U.S.C. section 360bbb-3(b)(1), unless the authorization is terminated or revoked sooner.  Performed at [facility], [address] [place], [place] [address]   Culture, blood (routine x 2)     Status: None (Preliminary result)   Collection Time: 02/04/23  7:50 PM   Specimen: BLOOD  Result Value Ref Range Status   Specimen Description BLOOD BLOOD RIGHT ARM  Final   Special Requests   Final    BOTTLES DRAWN AEROBIC AND ANAEROBIC Blood Culture results may not be optimal due to an excessive volume of blood received in culture bottles   Culture   Final    NO GROWTH < 12 HOURS Performed at [facility], [address] [place], [place] [address]    Report Status PENDING  Incomplete  Culture, blood (routine x 2)     Status: None (Preliminary result)   Collection Time: 02/04/23  7:50 PM   Specimen: BLOOD  Result Value Ref Range Status   Specimen Description BLOOD BLOOD LEFT ARM  Final   Special Requests   Final    BOTTLES DRAWN AEROBIC AND ANAEROBIC Blood Culture adequate volume   Culture   Final    NO GROWTH < 12 HOURS Performed at [facility], [address], [place], [place] [address]    Report Status PENDING  Incomplete         Radiology Studies: DG Chest Port 1 View  Result Date: 02/04/2023 CLINICAL DATA:  Weakness.  Cough EXAM: PORTABLE CHEST 1 VIEW COMPARISON:  X-ray 09/26/2022 FINDINGS: Developing parenchymal opacity along the left upper thorax. Acute infiltrate is possible and recommend follow-up. No pneumothorax,  effusion or edema. Normal cardiopericardial silhouette. IMPRESSION: Developing opacity left upper lobe. Possible acute infiltrate. Recommend follow-up to confirm clearance Electronically Signed   By: Karen Kays M.D.   On: 02/04/2023 19:27   DG Knee 2 Views Right  Result Date: 02/04/2023 CLINICAL DATA:  Pain after fall EXAM: RIGHT KNEE - 2 VIEW COMPARISON:  None Available. FINDINGS: Total knee arthroplasty identified with cemented femoral and tibial component. Patellar button. No fracture or dislocation. No obvious hardware failure. Preserved bone mineralization. No joint effusion on lateral view. IMPRESSION: Total knee arthroplasty Electronically Signed   By: Piedad Climes.D.  On: 02/04/2023 18:06   DG Hip Unilat  With Pelvis 2-3 Views Right  Result Date: 02/04/2023 CLINICAL DATA:  Pain after fall EXAM: DG HIP (WITH OR WITHOUT PELVIS) 3V RIGHT COMPARISON:  01/18/2019 FINDINGS: No fracture or dislocation. Preserved joint spaces. Preserved bone mineralization. Presumed contrast with a some diverticula along the pelvis. Moderate colonic stool. IMPRESSION: No acute osseous abnormality Electronically Signed   By: Karen Kays M.D.   On: 02/04/2023 18:05        Scheduled Meds:  dextromethorphan-guaiFENesin  1 tablet Oral BID   lamoTRIgine  300 mg Oral BID   montelukast  10 mg Oral QHS   pantoprazole  40 mg Oral BID   rivaroxaban  20 mg Oral Daily   simvastatin  40 mg Oral QHS   topiramate  200 mg Oral BID   Continuous Infusions:  aztreonam 1 g (02/05/23 0544)   vancomycin       LOS: 1 day    Time spent: 35 minutes  Signed,  Marcelline Mates, MS4 Working with Dr. Maurilio Lovely

## 2023-02-06 DIAGNOSIS — J189 Pneumonia, unspecified organism: Secondary | ICD-10-CM | POA: Diagnosis not present

## 2023-02-06 DIAGNOSIS — J449 Chronic obstructive pulmonary disease, unspecified: Secondary | ICD-10-CM | POA: Diagnosis not present

## 2023-02-06 DIAGNOSIS — W19XXXA Unspecified fall, initial encounter: Secondary | ICD-10-CM | POA: Diagnosis not present

## 2023-02-06 DIAGNOSIS — E876 Hypokalemia: Secondary | ICD-10-CM | POA: Diagnosis not present

## 2023-02-06 LAB — BASIC METABOLIC PANEL
Anion gap: 7 (ref 5–15)
BUN: 21 mg/dL (ref 8–23)
CO2: 22 mmol/L (ref 22–32)
Calcium: 8.6 mg/dL — ABNORMAL LOW (ref 8.9–10.3)
Chloride: 107 mmol/L (ref 98–111)
Creatinine, Ser: 1.11 mg/dL — ABNORMAL HIGH (ref 0.44–1.00)
GFR, Estimated: 55 mL/min — ABNORMAL LOW (ref >60)
Glucose, Bld: 95 mg/dL (ref 70–99)
Potassium: 4 mmol/L (ref 3.5–5.1)
Sodium: 136 mmol/L (ref 135–145)

## 2023-02-06 LAB — CBC
HCT: 30.7 % — ABNORMAL LOW (ref 36.0–46.0)
Hemoglobin: 9.4 g/dL — ABNORMAL LOW (ref 12.0–15.0)
MCH: 30.6 pg (ref 26.0–34.0)
MCHC: 30.6 g/dL (ref 30.0–36.0)
MCV: 100 fL (ref 80.0–100.0)
Platelets: 250 10*3/uL (ref 150–400)
RBC: 3.07 MIL/uL — ABNORMAL LOW (ref 3.87–5.11)
RDW: 15.3 % (ref 11.5–15.5)
WBC: 9.7 10*3/uL (ref 4.0–10.5)
nRBC: 0 % (ref 0.0–0.2)

## 2023-02-06 LAB — CULTURE, BLOOD (ROUTINE X 2)
Culture: NO GROWTH
Special Requests: ADEQUATE

## 2023-02-06 LAB — CULTURE, RESPIRATORY W GRAM STAIN

## 2023-02-06 LAB — MAGNESIUM: Magnesium: 2.2 mg/dL (ref 1.7–2.4)

## 2023-02-06 MED ORDER — DM-GUAIFENESIN ER 30-600 MG PO TB12: 1.0000 | ORAL_TABLET | Freq: Two times a day (BID) | ORAL | 0 refills | Status: AC

## 2023-02-06 MED ORDER — MOXIFLOXACIN HCL 400 MG PO TABS: 400.0000 mg | ORAL_TABLET | Freq: Every day | ORAL | 0 refills | Status: AC

## 2023-02-06 MED ORDER — ONDANSETRON HCL 4 MG PO TABS: 4.0000 mg | ORAL_TABLET | Freq: Every day | ORAL | 1 refills | Status: AC | PRN

## 2023-02-06 NOTE — Progress Notes (Signed)
Patient reported nausea and noted patient had one emesis, patient given PRN nausea medication see MAR. MD Sherryll Burger made aware. Per MD let patient eat then discharge after lunch. Patient reported at 1228 that she felt better and was ready to discharge. MD Sherryll Burger made aware. Proceed with d/c per MD.

## 2023-02-06 NOTE — Care Management Important Message (Signed)
Important Message  Patient Details  Name: [name] MRN: [id number] Date of Birth: 11-Oct-1955   Medicare Important Message Given:  Yes     Corey Harold 02/06/2023, 12:05 PM

## 2023-02-06 NOTE — TOC Transition Note (Signed)
Transition of Care [facility]) - CM/SW Discharge Note   Patient Details  Name: [name] MRN: [id number] Date of Birth: September 18, 1955  Transition of Care [facility]) CM/SW Contact:  Villa Herb, LCSWA Phone Number: 02/06/2023, 9:36 AM   Clinical Narrative:    Plan is for [facility] to continue following pt in community for home PT. She has appointment Monday with PT. They do not need orders. Pt has 3L home O2 through [employer]. TOC signing off.   Final next level of care: Home w Home Health Services Barriers to Discharge: No Barriers Identified   Patient Goals and CMS Choice   Choice offered to / list presented to : Spouse  Discharge Placement                         Discharge Plan and Services Additional resources added to the After Visit Summary for   In-house Referral: Clinical Social Work   Post Acute Care Choice: Resumption of Svcs/PTA Provider                               Social Determinants of Health (SDOH) Interventions SDOH Screenings   Food Insecurity: No Food Insecurity (02/04/2023)  Housing: Low Risk  (02/04/2023)  Transportation Needs: No Transportation Needs (02/04/2023)  Utilities: Not At Risk (02/04/2023)  Alcohol Screen: Low Risk  (07/11/2021)  Depression (PHQ2-9): Medium Risk (05/24/2021)  Financial Resource Strain: High Risk (09/13/2021)  Physical Activity: Inactive (08/13/2021)  Social Connections: Moderately Integrated (08/13/2021)  Tobacco Use: High Risk (02/04/2023)     Readmission Risk Interventions     No data to display

## 2023-02-06 NOTE — Progress Notes (Signed)
Patient discharged home today, transported home by family. Discharge paperwork went over with patient, patient verbalized understanding. Belongings sent home with patient.  ?

## 2023-02-06 NOTE — Progress Notes (Signed)
Ambulated patient from bed to bathroom with one assist, noted patients gait unsteady. Patient reported she normally uses a wheelchair to transfer to the bathroom at home. MD Sherryll Burger made aware. No new orders.

## 2023-02-06 NOTE — Discharge Summary (Signed)
Physician Discharge Summary  [name]:[id number] DOB: 11/17/1955 DOA: 02/04/2023  PCP: Shelby Dubin, FNP  Admit date: 02/04/2023  Discharge date: 02/06/2023  Admitted From: Home  Disposition:  Home  Recommendations for Outpatient Follow-up:  Follow up with PCP in 1-2 weeks Please obtain BMP/CBC in one week Please follow up on the following pending results: None at this time. Continue antibiotics (Moxifloxacin) to treat your pneumonia, with last dose 6/30.  Home Health: [facility] for home PT  Equipment/Devices: Has wheelchair  Discharge Condition:Stable  CODE STATUS: Full  Diet recommendation: Heart Healthy  Brief/Interim Summary:  [name] is a 67 y.o. female with medical history significant of hypertension, hyperlipidemia, COPD on supplemental oxygen at 3 LPM via [place] at baseline, GERD, type 2 diabetes mellitus, epilepsy, and tobacco abuse who presented to the emergency department via EMS from home due to a fall from her wheelchair, and was ultimately admitted for sepsis secondary to pneumonia.  XR evaluation for her fall was wnl. However, 6/26 CXR on admission showed a developing opacity in the LUL, alongside fever of 100.8, tachypnea, tachycardia and leukocytosis to 25.7k. She was ultimately started on an antibiotic regimen of Vancomycin and Aztreonam after consulting pharmacy due to her anaphylactic allergies listed to multiple antibiotics.   By HD1 her vitals stabilized, leukocytosis has improved to 19k, and she was successfully transitioned to PO antibiotics (Moxifloxacin). She remained at her baseline supplemental O2 requirement of 3L via [place] . On day of discharge, she endorsed breathing comfortably and improved cough.  Discharge Diagnoses:  Principal Problem:   Sepsis due to pneumonia [facility]) Active Problems:   Mixed hyperlipidemia   COPD (chronic obstructive pulmonary disease) (HCC)   GERD (gastroesophageal reflux disease)   Chronic respiratory failure  with hypoxia (HCC)   Seizure (HCC)   Fall at home, initial encounter   Hypokalemia   Tobacco abuse    Discharge Instructions  Discharge Instructions     Diet - low sodium heart healthy   Complete by: As directed    Increase activity slowly   Complete by: As directed       Allergies as of 02/06/2023       Reactions   Amoxicillin Anaphylaxis   Breakout, mouth swells   Azithromycin Swelling, Rash   "swelling all over, hives, thrush"   Cefdinir    Throat swelling after 3rd dose   Chantix [varenicline] Other (See Comments)   "bad dreams, difficulty breathing"   Doxycycline Hyclate Anaphylaxis, Rash   swelling   Levofloxacin Anaphylaxis   Pt can take moxifloxacin   Penicillins Anaphylaxis, Shortness Of Breath, Rash   "ALMOST DIED, ended up in hospital"    Sulfonamide Derivatives Shortness Of Breath, Swelling, Rash   "break out from head to toe"   Toradol [ketorolac Tromethamine] Other (See Comments)   Seizure   Clarithromycin Hives   swelling   Nortriptyline Swelling, Rash, Other (See Comments)   Per patient had "kidney problems" on this medication, could not use bathroom (urinary retention)   Paxlovid [nirmatrelvir-ritonavir] Swelling   Throat swelling   Codeine Nausea And Vomiting   Fluticasone-salmeterol Other (See Comments)   REACTION: ulcers in mouth   Triptans Rash   Mouth breaks out and start itching        Medication List     STOP taking these medications    cephALEXin 500 MG capsule Commonly known as: KEFLEX       TAKE these medications    albuterol 108 (90 Base) MCG/ACT  inhaler Commonly known as: ProAir HFA INHALE TWO PUFFS INTO THE LUNGS EVERY 6 HOURS AS NEEDED FOR SHORTNESS OF BREATH What changed:  how much to take how to take this when to take this reasons to take this additional instructions   albuterol (2.5 MG/3ML) 0.083% nebulizer solution Commonly known as: PROVENTIL USE 1 VIAL IN NEBULIZER EVERY 6 HOURS AS NEEDED FOR SHORTNESS  OF BREATH What changed:  how much to take how to take this when to take this reasons to take this additional instructions   allopurinol 100 MG tablet Commonly known as: ZYLOPRIM Take 100 mg by mouth daily.   ALPRAZolam 0.25 MG tablet Commonly known as: XANAX Take 0.25 mg by mouth 2 (two) times daily.   Azelastine HCl 0.15 % Soln Commonly known as: Astepro Place 1 spray into the nose 2 (two) times daily as needed (Allergies).   dextromethorphan-guaiFENesin 30-600 MG 12hr tablet Commonly known as: MUCINEX DM Take 1 tablet by mouth 2 (two) times daily for 10 days.   famotidine 40 MG tablet Commonly known as: PEPCID Take 40 mg by mouth at bedtime.   ferrous sulfate 324 (65 Fe) MG Tbec TAKE ONE TABLET BY MOUTH EVERY OTHER DAY What changed: how much to take   gabapentin 100 MG capsule Commonly known as: NEURONTIN Take 1 capsule (100 mg total) by mouth at bedtime. What changed: when to take this   guaiFENesin 600 MG 12 hr tablet Commonly known as: Mucinex Take 2 tablets (1,200 mg total) by mouth 2 (two) times daily as needed. What changed: reasons to take this   ipratropium-albuterol 0.5-2.5 (3) MG/3ML Soln Commonly known as: DUONEB Take 3 mLs by nebulization every 6 (six) hours as needed (Wheezing/sob).   lamoTRIgine 200 MG tablet Commonly known as: LAMICTAL TAKE 1 AND 1/2 TABLETS BY MOUTH TWICE DAILY   loratadine 10 MG tablet Commonly known as: Allergy Relief Take 1 tablet (10 mg total) by mouth daily.   montelukast 10 MG tablet Commonly known as: SINGULAIR TAKE ONE TABLET BY MOUTH AT BEDTIME   moxifloxacin 400 MG tablet Commonly known as: AVELOX Take 1 tablet (400 mg total) by mouth daily at 8 pm for 3 days.   oxyCODONE-acetaminophen 5-325 MG tablet Commonly known as: PERCOCET/ROXICET Take 1 tablet by mouth every 6 (six) hours as needed for severe pain.   OXYGEN Inhale 3 L into the lungs continuous.   pantoprazole 40 MG tablet Commonly known as:  PROTONIX Take 1 tablet (40 mg total) by mouth 2 (two) times daily. **PLEASE CALL OFFICE TO SCHEDULE FOLLOW UP   QUEtiapine 100 MG tablet Commonly known as: SEROQUEL TAKE ONE TABLET BY MOUTH AT BEDTIME   simvastatin 40 MG tablet Commonly known as: ZOCOR TAKE ONE TABLET BY MOUTH AT BEDTIME What changed: when to take this   tiZANidine 4 MG tablet Commonly known as: ZANAFLEX TAKE ONE TABLET BY MOUTH EVERY 6 HOURS AS NEEDED FOR MUSCLE SPASMS FOR UP TO 30 DOSES   topiramate 100 MG tablet Commonly known as: TOPAMAX Take 200 mg twice daily What changed:  how much to take how to take this when to take this additional instructions   Trelegy Ellipta 100-62.5-25 MCG/ACT Aepb Generic drug: Fluticasone-Umeclidin-Vilant INHALE 1 PUFF BY MOUTH INTO THE LUNGS DAILY IN THE AFTERNOON What changed: See the new instructions.   triamcinolone cream 0.1 % Commonly known as: KENALOG Apply 1 Application topically daily as needed (irritation).   Vitamin D3 50 MCG (2000 UT) capsule TAKE ONE CAPSULE BY MOUTH DAILY  Xarelto 20 MG Tabs tablet Generic drug: rivaroxaban Take 20 mg by mouth daily.        Follow-up Information     Bucio, Julian Reil, FNP. Schedule an appointment as soon as possible for a visit in 1 week(s).   Specialty: Family Medicine Contact information: [address] [place] [address] [phone]                Allergies  Allergen Reactions   Amoxicillin Anaphylaxis    Breakout, mouth swells   Azithromycin Swelling and Rash    "swelling all over, hives, thrush"   Cefdinir     Throat swelling after 3rd dose   Chantix [Varenicline] Other (See Comments)    "bad dreams, difficulty breathing"   Doxycycline Hyclate Anaphylaxis and Rash    swelling   Levofloxacin Anaphylaxis    Pt can take moxifloxacin   Penicillins Anaphylaxis, Shortness Of Breath and Rash    "ALMOST DIED, ended up in hospital"    Sulfonamide Derivatives Shortness Of Breath, Swelling and Rash     "break out from head to toe"   Toradol [Ketorolac Tromethamine] Other (See Comments)    Seizure    Clarithromycin Hives    swelling   Nortriptyline Swelling, Rash and Other (See Comments)    Per patient had "kidney problems" on this medication, could not use bathroom (urinary retention)   Paxlovid [Nirmatrelvir-Ritonavir] Swelling    Throat swelling   Codeine Nausea And Vomiting   Fluticasone-Salmeterol Other (See Comments)    REACTION: ulcers in mouth   Triptans Rash    Mouth breaks out and start itching    Consultations: None   Procedures/Studies: DG Chest Port 1 View  Result Date: 02/04/2023 CLINICAL DATA:  Weakness.  Cough EXAM: PORTABLE CHEST 1 VIEW COMPARISON:  X-ray 09/26/2022 FINDINGS: Developing parenchymal opacity along the left upper thorax. Acute infiltrate is possible and recommend follow-up. No pneumothorax, effusion or edema. Normal cardiopericardial silhouette. IMPRESSION: Developing opacity left upper lobe. Possible acute infiltrate. Recommend follow-up to confirm clearance Electronically Signed   By: Karen Kays M.D.   On: 02/04/2023 19:27   DG Knee 2 Views Right  Result Date: 02/04/2023 CLINICAL DATA:  Pain after fall EXAM: RIGHT KNEE - 2 VIEW COMPARISON:  None Available. FINDINGS: Total knee arthroplasty identified with cemented femoral and tibial component. Patellar button. No fracture or dislocation. No obvious hardware failure. Preserved bone mineralization. No joint effusion on lateral view. IMPRESSION: Total knee arthroplasty Electronically Signed   By: Karen Kays M.D.   On: 02/04/2023 18:06   DG Hip Unilat  With Pelvis 2-3 Views Right  Result Date: 02/04/2023 CLINICAL DATA:  Pain after fall EXAM: DG HIP (WITH OR WITHOUT PELVIS) 3V RIGHT COMPARISON:  01/18/2019 FINDINGS: No fracture or dislocation. Preserved joint spaces. Preserved bone mineralization. Presumed contrast with a some diverticula along the pelvis. Moderate colonic stool. IMPRESSION: No  acute osseous abnormality Electronically Signed   By: Karen Kays M.D.   On: 02/04/2023 18:05   DG C-Arm 1-60 Min-No Report  Result Date: 01/27/2023 Fluoroscopy was utilized by the requesting physician.  No radiographic interpretation.     Discharge Exam: Vitals:   02/05/23 2032 02/06/23 0400  BP: (!) 92/56 103/68  Pulse: 84 86  Resp: 18 18  Temp: 98.1 F (36.7 C) 98 F (36.7 C)  SpO2: 98% 100%   Vitals:   02/05/23 1206 02/05/23 1344 02/05/23 2032 02/06/23 0400  BP: 97/66 93/61 (!) 92/56 103/68  Pulse: 89 83  84 86  Resp: 18 16 18 18   Temp: 98.4 F (36.9 C) 97.7 F (36.5 C) 98.1 F (36.7 C) 98 F (36.7 C)  TempSrc: Oral Oral Oral Oral  SpO2: 100% 100% 98% 100%  Weight:      Height:        General: Pt is alert, awake, not in acute distress, obese Cardiovascular: RRR, S1/S2 +, no rubs, no gallops Respiratory: Mild crackles over R lung, and expiratory wheezes diffusely Abdominal: Soft, NT, ND, bowel sounds + Extremities: no edema, no cyanosis    The results of significant diagnostics from this hospitalization (including imaging, microbiology, ancillary and laboratory) are listed below for reference.     Microbiology: Recent Results (from the past 240 hour(s))  Expectorated Sputum Assessment w Gram Stain, Rflx to Resp Cult     Status: None   Collection Time: 02/04/23 12:43 AM   Specimen: Expectorated Sputum  Result Value Ref Range Status   Specimen Description EXPECTORATED SPUTUM  Final   Special Requests NONE  Final   Sputum evaluation   Final    THIS SPECIMEN IS ACCEPTABLE FOR SPUTUM CULTURE Performed at [facility], [address], [place], [place] [address]    Report Status 02/05/2023 FINAL  Final  Culture, Respiratory w Gram Stain     Status: None (Preliminary result)   Collection Time: 02/04/23 12:43 AM  Result Value Ref Range Status   Specimen Description   Final    EXPECTORATED SPUTUM Performed at [facility], [address] [place], [place]  [address]    Special Requests   Final    NONE Reflexed from [id number] Performed at [facility], [address] [place], [place] [address]    Gram Stain   Final    ABUNDANT WBC PRESENT, PREDOMINANTLY PMN RARE YEAST FEW GRAM POSITIVE COCCI FEW GRAM POSITIVE RODS RARE GRAM NEGATIVE RODS Performed at [facility], [address], [place], [place] [address]    Culture PENDING  Incomplete   Report Status PENDING  Incomplete  SARS Coronavirus 2 by RT PCR (hospital order, performed in [facility] hospital lab) *cepheid single result test* Anterior Nasal Swab     Status: None   Collection Time: 02/04/23  7:17 PM   Specimen: Anterior Nasal Swab  Result Value Ref Range Status   SARS Coronavirus 2 by RT PCR NEGATIVE NEGATIVE Final    Comment: (NOTE) SARS-CoV-2 target nucleic acids are NOT DETECTED.  The SARS-CoV-2 RNA is generally detectable in upper and lower respiratory specimens during the acute phase of infection. The lowest concentration of SARS-CoV-2 viral copies this assay can detect is 250 copies / mL. A negative result does not preclude SARS-CoV-2 infection and should not be used as the sole basis for treatment or other patient management decisions.  A negative result may occur with improper specimen collection / handling, submission of specimen other than nasopharyngeal swab, presence of viral mutation(s) within the areas targeted by this assay, and inadequate number of viral copies (<250 copies / mL). A negative result must be combined with clinical observations, patient history, and epidemiological information.  Fact Sheet for Patients:   RoadLapTop.co.za  Fact Sheet for Healthcare Providers: http://kim-miller.com/  This test is not yet approved or  cleared by the [place] FDA and has been authorized for detection and/or diagnosis of SARS-CoV-2 by FDA under an Emergency Use Authorization (EUA).  This EUA will remain in  effect (meaning this test can be used) for the duration of the COVID-19  declaration under Section 564(b)(1) of the Act, 21 U.S.C. section 360bbb-3(b)(1), unless the authorization is terminated or revoked sooner.  Performed at [facility], [address] [place], [place] [address]   Culture, blood (routine x 2)     Status: None (Preliminary result)   Collection Time: 02/04/23  7:50 PM   Specimen: BLOOD  Result Value Ref Range Status   Specimen Description BLOOD BLOOD RIGHT ARM  Final   Special Requests   Final    BOTTLES DRAWN AEROBIC AND ANAEROBIC Blood Culture results may not be optimal due to an excessive volume of blood received in culture bottles   Culture   Final    NO GROWTH 2 DAYS Performed at [facility], [address] [place], [place] [address]    Report Status PENDING  Incomplete  Culture, blood (routine x 2)     Status: None (Preliminary result)   Collection Time: 02/04/23  7:50 PM   Specimen: BLOOD  Result Value Ref Range Status   Specimen Description BLOOD BLOOD LEFT ARM  Final   Special Requests   Final    BOTTLES DRAWN AEROBIC AND ANAEROBIC Blood Culture adequate volume   Culture   Final    NO GROWTH 2 DAYS Performed at [facility], [address] [place], [place] [address]    Report Status PENDING  Incomplete     Labs: BNP (last 3 results) No results for input(s): "BNP" in the last 8760 hours. Basic Metabolic Panel: Recent Labs  Lab 02/04/23 1950 02/05/23 0518 02/06/23 0809  NA 136 136 136  K 3.3* 3.8 4.0  CL 107 109 107  CO2 21* 19* 22  GLUCOSE 114* 94 95  BUN 23 19 21   CREATININE 1.02* 0.84 1.11*  CALCIUM 8.5* 8.4* 8.6*  MG  --  2.1 2.2  PHOS  --  3.1  --    Liver Function Tests: Recent Labs  Lab 02/05/23 0518  AST 12*  ALT 10  ALKPHOS 85  BILITOT 0.5  PROT 7.0  ALBUMIN 2.7*   No results for input(s): "LIPASE", "AMYLASE" in the last 168 hours. No results for input(s): "AMMONIA" in the last 168 hours. CBC: Recent Labs  Lab  02/04/23 1950 02/05/23 0518 02/06/23 0809  WBC 25.7* 18.9* 9.7  NEUTROABS 22.2*  --   --   HGB 10.2* 9.2* 9.4*  HCT 32.8* 29.6* 30.7*  MCV 98.5 98.7 100.0  PLT 251 225 250   Cardiac Enzymes: No results for input(s): "CKTOTAL", "CKMB", "CKMBINDEX", "TROPONINI" in the last 168 hours. BNP: Invalid input(s): "POCBNP" CBG: Recent Labs  Lab 02/04/23 2331  GLUCAP 109*   D-Dimer No results for input(s): "DDIMER" in the last 72 hours. Hgb A1c No results for input(s): "HGBA1C" in the last 72 hours. Lipid Profile No results for input(s): "CHOL", "HDL", "LDLCALC", "TRIG", "CHOLHDL", "LDLDIRECT" in the last 72 hours. Thyroid function studies No results for input(s): "TSH", "T4TOTAL", "T3FREE", "THYROIDAB" in the last 72 hours.  Invalid input(s): "FREET3" Anemia work up No results for input(s): "VITAMINB12", "FOLATE", "FERRITIN", "TIBC", "IRON", "RETICCTPCT" in the last 72 hours. Urinalysis    Component Value Date/Time   COLORURINE YELLOW 02/04/2023 2133   APPEARANCEUR HAZY (A) 02/04/2023 2133   APPEARANCEUR Clear 01/16/2023 1107   LABSPEC 1.026 02/04/2023 2133   PHURINE 5.0 02/04/2023 2133   GLUCOSEU NEGATIVE 02/04/2023 2133   HGBUR NEGATIVE 02/04/2023 2133   HGBUR negative 11/17/2007 0955   BILIRUBINUR NEGATIVE 02/04/2023 2133   BILIRUBINUR Negative 01/16/2023 1107   KETONESUR  NEGATIVE 02/04/2023 2133   PROTEINUR NEGATIVE 02/04/2023 2133   UROBILINOGEN 0.2 06/13/2016 1344   UROBILINOGEN 0.2 08/05/2014 1724   NITRITE NEGATIVE 02/04/2023 2133   LEUKOCYTESUR SMALL (A) 02/04/2023 2133   Sepsis Labs Recent Labs  Lab 02/04/23 1950 02/05/23 0518 02/06/23 0809  WBC 25.7* 18.9* 9.7   Microbiology Recent Results (from the past 240 hour(s))  Expectorated Sputum Assessment w Gram Stain, Rflx to Resp Cult     Status: None   Collection Time: 02/04/23 12:43 AM   Specimen: Expectorated Sputum  Result Value Ref Range Status   Specimen Description EXPECTORATED SPUTUM  Final    Special Requests NONE  Final   Sputum evaluation   Final    THIS SPECIMEN IS ACCEPTABLE FOR SPUTUM CULTURE Performed at [facility], [address], [place], [place] [address]    Report Status 02/05/2023 FINAL  Final  Culture, Respiratory w Gram Stain     Status: None (Preliminary result)   Collection Time: 02/04/23 12:43 AM  Result Value Ref Range Status   Specimen Description   Final    EXPECTORATED SPUTUM Performed at [facility], [address] [place], [place] [address]    Special Requests   Final    NONE Reflexed from [id number] Performed at [facility], [address], [place], [place] [address]    Gram Stain   Final    ABUNDANT WBC PRESENT, PREDOMINANTLY PMN RARE YEAST FEW GRAM POSITIVE COCCI FEW GRAM POSITIVE RODS RARE GRAM NEGATIVE RODS Performed at [facility], [address], [place], [place] [address]    Culture PENDING  Incomplete   Report Status PENDING  Incomplete  SARS Coronavirus 2 by RT PCR (hospital order, performed in [facility] hospital lab) *cepheid single result test* Anterior Nasal Swab     Status: None   Collection Time: 02/04/23  7:17 PM   Specimen: Anterior Nasal Swab  Result Value Ref Range Status   SARS Coronavirus 2 by RT PCR NEGATIVE NEGATIVE Final    Comment: (NOTE) SARS-CoV-2 target nucleic acids are NOT DETECTED.  The SARS-CoV-2 RNA is generally detectable in upper and lower respiratory specimens during the acute phase of infection. The lowest concentration of SARS-CoV-2 viral copies this assay can detect is 250 copies / mL. A negative result does not preclude SARS-CoV-2 infection and should not be used as the sole basis for treatment or other patient management decisions.  A negative result may occur with improper specimen collection / handling, submission of specimen other than nasopharyngeal swab, presence of viral mutation(s) within the areas targeted by this assay, and inadequate number of viral copies (<250 copies / mL). A  negative result must be combined with clinical observations, patient history, and epidemiological information.  Fact Sheet for Patients:   RoadLapTop.co.za  Fact Sheet for Healthcare Providers: http://kim-miller.com/  This test is not yet approved or  cleared by the [place] FDA and has been authorized for detection and/or diagnosis of SARS-CoV-2 by FDA under an Emergency Use Authorization (EUA).  This EUA will remain in effect (meaning this test can be used) for the duration of the COVID-19 declaration under Section 564(b)(1) of the Act, 21 U.S.C. section 360bbb-3(b)(1), unless the authorization is terminated or revoked sooner.  Performed at [facility], [address] [place], [place] [address]   Culture, blood (routine x 2)     Status: None (Preliminary result)   Collection Time: 02/04/23  7:50 PM   Specimen: BLOOD  Result Value Ref Range Status  Specimen Description BLOOD BLOOD RIGHT ARM  Final   Special Requests   Final    BOTTLES DRAWN AEROBIC AND ANAEROBIC Blood Culture results may not be optimal due to an excessive volume of blood received in culture bottles   Culture   Final    NO GROWTH 2 DAYS Performed at [facility], [address], [place], [place] [address]    Report Status PENDING  Incomplete  Culture, blood (routine x 2)     Status: None (Preliminary result)   Collection Time: 02/04/23  7:50 PM   Specimen: BLOOD  Result Value Ref Range Status   Specimen Description BLOOD BLOOD LEFT ARM  Final   Special Requests   Final    BOTTLES DRAWN AEROBIC AND ANAEROBIC Blood Culture adequate volume   Culture   Final    NO GROWTH 2 DAYS Performed at [facility], [address], [place], [place] [address]    Report Status PENDING  Incomplete     Time coordinating discharge: 35 minutes  SIGNED:  Marcelline Mates, MS4

## 2023-02-07 LAB — CULTURE, RESPIRATORY W GRAM STAIN

## 2023-02-08 LAB — CULTURE, BLOOD (ROUTINE X 2)

## 2023-02-09 LAB — CULTURE, BLOOD (ROUTINE X 2): Culture: NO GROWTH

## 2023-02-09 LAB — LEGIONELLA PNEUMOPHILA SEROGP 1 UR AG: L. pneumophila Serogp 1 Ur Ag: NEGATIVE

## 2023-02-16 ENCOUNTER — Ambulatory Visit (INDEPENDENT_AMBULATORY_CARE_PROVIDER_SITE_OTHER): Payer: 59 | Admitting: Pulmonary Disease

## 2023-02-16 ENCOUNTER — Encounter: Payer: Self-pay | Admitting: Pulmonary Disease

## 2023-02-16 VITALS — BP 90/59 | HR 98 | Ht 61.0 in | Wt 158.0 lb

## 2023-02-16 DIAGNOSIS — J449 Chronic obstructive pulmonary disease, unspecified: Secondary | ICD-10-CM | POA: Diagnosis not present

## 2023-02-16 DIAGNOSIS — Z8701 Personal history of pneumonia (recurrent): Secondary | ICD-10-CM

## 2023-02-16 DIAGNOSIS — J9611 Chronic respiratory failure with hypoxia: Secondary | ICD-10-CM | POA: Diagnosis not present

## 2023-02-16 DIAGNOSIS — Z716 Tobacco abuse counseling: Secondary | ICD-10-CM

## 2023-02-16 DIAGNOSIS — Z720 Tobacco use: Secondary | ICD-10-CM

## 2023-02-16 NOTE — Progress Notes (Signed)
[facility]  Chief Complaint  Patient presents with   Follow-up    Past Surgical History:  She  has a past surgical history that includes Tubal ligation (2001); Knee arthroscopy (1990's); Carpal tunnel release (~ 2008); Shoulder open rotator cuff repair (~ 2010); Abdominal hysterectomy (2001); Total knee arthroplasty (08/23/2012); Total knee arthroplasty (08/23/2012); Cataract extraction (Bilateral); Colonoscopy with esophagogastroduodenoscopy (egd); Nasal septoplasty w/ turbinoplasty (Bilateral, 05/23/2016); Sinus endo with fusion (Right, 05/23/2016); Colonoscopy; Upper gastrointestinal endoscopy; Esophagogastroduodenoscopy (egd) with propofol (N/A, 04/16/2021); biopsy (04/16/2021); Sinoscopy; Carpal tunnel release (Right); and Cystoscopy/ureteroscopy/holmium laser/stent placement (Left, 01/27/2023).  Past Medical History:  OA, COVID 29 March 2021, Diverticulosis, Epilepsy, GERD, Glaucoma, Rt leg DVT, HLD, HTN, IBS, Migraine headache, PNA, DM type 2  Constitutional:  BP (!) 90/59   Pulse 98   Ht 5\' 1"  (1.549 m)   Wt 158 lb (71.7 kg)   SpO2 96% Comment: 3L cont  BMI 29.85 kg/m   Brief Summary:  [name] is a 67 y.o. female smoker with COPD, obstructive sleep apnea, recurrent pneumonia and chronic respiratory failure.      Subjective:   She is here with her family.  Was in hospital in June with Lt upper lobe pneumonia.  Feels better.  Still has cough with clear sputum.  Feels fatigued and gets winded with activity.  No chest pain, fever, or hemoptysis.  Down to smoking a couple cigarettes per day.     Physical Exam:   Appearance - well kempt, sitting in wheelchair, wearing oxygen  ENMT - no sinus tenderness, no oral exudate, no LAN, Mallampati 2 airway, no stridor  Respiratory - bilateral rhonchi that clear with coughing  CV - s1s2 regular rate and rhythm, no murmurs  Ext - no clubbing, no edema  Skin - no rashes  Psych -  normal mood and affect        Pulmonary testing:  Spirometry 10/27/13 >> FEV1 1.51 (66%), FEV1% 68 PFT 09/27/21 >> FEV1 1.05 (47%), FEV1% 58. TLC 3.51 (76%), DLCO 52% ABT 04/20/22 >> pH 7.32, PCO2 51.1, PO2 92  Chest Imaging:  HRCT chest 11/07/15 >> upper lobe scarring CT chest 05/11/21 >> centrilobular and paraseptal emphysema CT chest 05/12/22 >> moderate emphysema, apical scarring, patchy opacities CT angio chest 08/05/22 >> changes of emphysema, patchy subpleural ASD with some progression CT angio chest 09/18/22 >> alveolar consolidation LUL, small pneumonitis LLL  Sleep Tests:  ONO with CPAP and 2 liters 09/04/21 >> test time 6 hrs 33 min.  Baseline SpO2 94%, low SpO2 87%.  Spent 12 sec with SpO2 < 88%.  Cardiac Tests:  Echo 09/03/21 >> EF 60 to 65%, grade 1 DD Doppler Lt leg 12/26/21 >> DVT Lt common femoral vein  Social History:  She  reports that she has been smoking cigarettes. She started smoking about 47 years ago. She has a 20.00 pack-year smoking history. She has never used smokeless tobacco. She reports that she does not drink alcohol and does not use drugs.  Family History:  Her family history includes Alcohol abuse in her father; Bipolar disorder in her son; Breast cancer in her maternal aunt; Diabetes in her brother, maternal aunt, maternal grandfather, maternal uncle, and mother; Emphysema in her father; Esophageal cancer in her maternal uncle; Heart disease in her maternal grandfather, sister, son, and another family member; Lung cancer in her father and mother; Muscular dystrophy in her son; Other in her sister; Seizures in her brother; Stroke in her  mother.     Assessment/Plan:   COPD with chronic bronchitis and emphysema. - she has multiple Abx allergies - continue trelegy 100 one puff daily, singulair 10 mg nightly - advised her to use albuterol, flutter valve, and mucinex more when she has more congestion and cough - she has a nebulizer  Allergic rhinitis. -  continue singulair, astepro  Obstructive sleep apnea. -  she was intolerant of PAP therapy  Chronic respiratory failure with hypoxia/hypercapnia. - 3 liters oxygen 24/7 - gets supplies from [employer] - goal SpO2 > 90%  Tobacco abuse. - discussed importance of smoking cessation, and spent 5 minutes doing this  Community acquired pneumonia. - from June 2024 - will have her get a chest xray in two weeks and call her with the results   Time Spent Involved in Patient Care on Day of Examination:  38 minutes  Follow up:   Patient Instructions  Go to [facility] hospital to get a chest xray in two weeks  Follow up in 3 to 4 months  Medication List:   Allergies as of 02/16/2023       Reactions   Amoxicillin Anaphylaxis   Breakout, mouth swells   Azithromycin Swelling, Rash   "swelling all over, hives, thrush"   Cefdinir    Throat swelling after 3rd dose   Chantix [varenicline] Other (See Comments)   "bad dreams, difficulty breathing"   Doxycycline Hyclate Anaphylaxis, Rash   swelling   Levofloxacin Anaphylaxis   Pt can take moxifloxacin   Penicillins Anaphylaxis, Shortness Of Breath, Rash   "ALMOST DIED, ended up in hospital"    Sulfonamide Derivatives Shortness Of Breath, Swelling, Rash   "break out from head to toe"   Toradol [ketorolac Tromethamine] Other (See Comments)   Seizure   Clarithromycin Hives   swelling   Nortriptyline Swelling, Rash, Other (See Comments)   Per patient had "kidney problems" on this medication, could not use bathroom (urinary retention)   Paxlovid [nirmatrelvir-ritonavir] Swelling   Throat swelling   Codeine Nausea And Vomiting   Fluticasone-salmeterol Other (See Comments)   REACTION: ulcers in mouth   Triptans Rash   Mouth breaks out and start itching        Medication List        Accurate as of February 16, 2023  2:14 PM. If you have any questions, ask your nurse or doctor.          STOP taking these medications    tiZANidine 4  MG tablet Commonly known as: ZANAFLEX Stopped by: Coralyn Helling, MD       TAKE these medications    albuterol 108 (90 Base) MCG/ACT inhaler Commonly known as: ProAir HFA INHALE TWO PUFFS INTO THE LUNGS EVERY 6 HOURS AS NEEDED FOR SHORTNESS OF BREATH What changed:  how much to take how to take this when to take this reasons to take this additional instructions   albuterol (2.5 MG/3ML) 0.083% nebulizer solution Commonly known as: PROVENTIL USE 1 VIAL IN NEBULIZER EVERY 6 HOURS AS NEEDED FOR SHORTNESS OF BREATH What changed:  how much to take how to take this when to take this reasons to take this additional instructions   allopurinol 100 MG tablet Commonly known as: ZYLOPRIM Take 100 mg by mouth daily.   ALPRAZolam 0.25 MG tablet Commonly known as: XANAX Take 0.25 mg by mouth 2 (two) times daily.   Azelastine HCl 0.15 % Soln Commonly known as: Astepro Place 1 spray into the nose 2 (two) times  daily as needed (Allergies).   dextromethorphan-guaiFENesin 30-600 MG 12hr tablet Commonly known as: MUCINEX DM Take 1 tablet by mouth 2 (two) times daily for 10 days.   famotidine 40 MG tablet Commonly known as: PEPCID Take 40 mg by mouth at bedtime.   ferrous sulfate 324 (65 Fe) MG Tbec TAKE ONE TABLET BY MOUTH EVERY OTHER DAY What changed: how much to take   gabapentin 100 MG capsule Commonly known as: NEURONTIN Take 1 capsule (100 mg total) by mouth at bedtime. What changed: when to take this   guaiFENesin 600 MG 12 hr tablet Commonly known as: Mucinex Take 2 tablets (1,200 mg total) by mouth 2 (two) times daily as needed. What changed: reasons to take this   ipratropium-albuterol 0.5-2.5 (3) MG/3ML Soln Commonly known as: DUONEB Take 3 mLs by nebulization every 6 (six) hours as needed (Wheezing/sob).   lamoTRIgine 200 MG tablet Commonly known as: LAMICTAL TAKE 1 AND 1/2 TABLETS BY MOUTH TWICE DAILY   loratadine 10 MG tablet Commonly known as: Allergy  Relief Take 1 tablet (10 mg total) by mouth daily.   montelukast 10 MG tablet Commonly known as: SINGULAIR TAKE ONE TABLET BY MOUTH AT BEDTIME   ondansetron 4 MG tablet Commonly known as: Zofran Take 1 tablet (4 mg total) by mouth daily as needed for nausea or vomiting.   oxyCODONE-acetaminophen 5-325 MG tablet Commonly known as: PERCOCET/ROXICET Take 1 tablet by mouth every 6 (six) hours as needed for severe pain.   OXYGEN Inhale 3 L into the lungs continuous.   pantoprazole 40 MG tablet Commonly known as: PROTONIX Take 1 tablet (40 mg total) by mouth 2 (two) times daily. **PLEASE CALL OFFICE TO SCHEDULE FOLLOW UP   QUEtiapine 100 MG tablet Commonly known as: SEROQUEL TAKE ONE TABLET BY MOUTH AT BEDTIME   simvastatin 40 MG tablet Commonly known as: ZOCOR TAKE ONE TABLET BY MOUTH AT BEDTIME What changed: when to take this   topiramate 100 MG tablet Commonly known as: TOPAMAX Take 200 mg twice daily What changed:  how much to take how to take this when to take this additional instructions   Trelegy Ellipta 100-62.5-25 MCG/ACT Aepb Generic drug: Fluticasone-Umeclidin-Vilant INHALE 1 PUFF BY MOUTH INTO THE LUNGS DAILY IN THE AFTERNOON What changed: See the new instructions.   triamcinolone cream 0.1 % Commonly known as: KENALOG Apply 1 Application topically daily as needed (irritation).   Vitamin D3 50 MCG (2000 UT) capsule TAKE ONE CAPSULE BY MOUTH DAILY   Xarelto 20 MG Tabs tablet Generic drug: rivaroxaban Take 20 mg by mouth daily.        Signature:  Coralyn Helling, MD [facility] Pager - [phone] 02/16/2023, 2:14 PM

## 2023-02-16 NOTE — Patient Instructions (Signed)
Go to [facility] hospital to get a chest xray in two weeks  Follow up in 3 to 4 months

## 2023-02-19 ENCOUNTER — Encounter: Payer: Self-pay | Admitting: Urology

## 2023-02-19 ENCOUNTER — Ambulatory Visit (INDEPENDENT_AMBULATORY_CARE_PROVIDER_SITE_OTHER): Payer: 59 | Admitting: Urology

## 2023-02-19 VITALS — BP 102/64 | HR 96

## 2023-02-19 DIAGNOSIS — B3731 Acute candidiasis of vulva and vagina: Secondary | ICD-10-CM

## 2023-02-19 DIAGNOSIS — N200 Calculus of kidney: Secondary | ICD-10-CM

## 2023-02-19 DIAGNOSIS — Z87440 Personal history of urinary (tract) infections: Secondary | ICD-10-CM

## 2023-02-19 LAB — URINALYSIS, ROUTINE W REFLEX MICROSCOPIC
Bilirubin, UA: NEGATIVE
Glucose, UA: NEGATIVE
Ketones, UA: NEGATIVE
Leukocytes,UA: NEGATIVE
Nitrite, UA: NEGATIVE
Protein,UA: NEGATIVE
RBC, UA: NEGATIVE
Specific Gravity, UA: 1.025 (ref 1.005–1.030)
Urobilinogen, Ur: 0.2 mg/dL (ref 0.2–1.0)
pH, UA: 6 (ref 5.0–7.5)

## 2023-02-19 MED ORDER — FLUCONAZOLE 150 MG PO TABS: 150.0000 mg | ORAL_TABLET | Freq: Once | ORAL | 0 refills | Status: AC

## 2023-02-19 NOTE — Progress Notes (Signed)
Subjective: 1. Kidney stone on left side   2. History of UTI   3. Candidiasis of female genitalia       02/19/23: [name] returns today in f/u.  She had an 8mm left renal pelvic stone treated with URS and HLL on 01/27/23.  She was left with a tethered stent.   She was subsequently admitted to the hospital with  LUL klebsiella pneumonia on 6/26 and was discharged on 6/28.  UA on admission only showed 6-10 WBC.  Her UA today is clear.  She has no flank pain.  She has hesitancy and went one day without voiding.  She is having vaginal itching.    GU Hx: [name] is a 67 yo female who has a 2 week history of hematuria with some LLQ pain.  She was in the ER on Monday night and was found to have an 8mm left renal pelvic stones without obstruction.  She has had no dysuria.  She had temp of 100 last night.  She has a history of UTI's.  Her UA in the ER had >50 RBC's and 6-10 WBC.  Her WBC was 14 with a Hgb of 11.3.   Her Cr is 0.96.  She is on Xarelto for a history of DVT.  She has been on topiramate for a couple of years.   She was given keflex in the ER.   Her urine remains bloody.   She has multiple antibiotic allergies.  ROS:  Review of Systems  Constitutional:  Positive for chills.  Respiratory:  Positive for cough.   Musculoskeletal:  Positive for back pain and joint pain.  Neurological:  Positive for dizziness, weakness and headaches.  Psychiatric/Behavioral:  Positive for memory loss.     Allergies  Allergen Reactions   Amoxicillin Anaphylaxis    Breakout, mouth swells   Azithromycin Swelling and Rash    "swelling all over, hives, thrush"   Cefdinir     Throat swelling after 3rd dose   Chantix [Varenicline] Other (See Comments)    "bad dreams, difficulty breathing"   Doxycycline Hyclate Anaphylaxis and Rash    swelling   Levofloxacin Anaphylaxis    Pt can take moxifloxacin   Penicillins Anaphylaxis, Shortness Of Breath and Rash    "ALMOST DIED, ended up in hospital"    Sulfonamide  Derivatives Shortness Of Breath, Swelling and Rash    "break out from head to toe"   Toradol [Ketorolac Tromethamine] Other (See Comments)    Seizure    Clarithromycin Hives    swelling   Nortriptyline Swelling, Rash and Other (See Comments)    Per patient had "kidney problems" on this medication, could not use bathroom (urinary retention)   Paxlovid [Nirmatrelvir-Ritonavir] Swelling    Throat swelling   Codeine Nausea And Vomiting   Fluticasone-Salmeterol Other (See Comments)    REACTION: ulcers in mouth   Triptans Rash    Mouth breaks out and start itching    Past Medical History:  Diagnosis Date   Allergic rhinitis    Anxiety    Arthritis    "hands and legs and feet" (04/27/2012)   Asthma    Cancer (HCC) 2005   rt arm mole rem cancer   CHF (congestive heart failure) (HCC)    Chronic bronchitis (HCC)    "keep it all the time" (04/27/2012)   Chronic kidney disease    Chronic kidney disease (CKD)    Stage III   COPD (chronic obstructive pulmonary disease) (HCC)    COVID-19  virus infection    03-2021   Depression    PT DENIES   Diverticulosis    DJD (degenerative joint disease)    Emphysema    Epilepsy (HCC)    Epilepsy (HCC)    Esophageal stricture    Exertional dyspnea    GERD (gastroesophageal reflux disease)    Glaucoma    H/O blood clots    Right leg   Heart failure (HCC)    History of COVID-19 03/2021   History of kidney stones    History of nuclear stress test    Myoview 12/25/22: EF 74, no ischemia or infarction, low risk   Hyperlipidemia    Hypertension    IBS (irritable bowel syndrome)    Migraine    Neck pain    Cervical disc   OSA on CPAP    6-7 yrs; pt wears CPAP but has not been able to use it lately due to sinus issues   Oxygen deficiency    USES 3 LITERS 02 CONTINOUS   Pneumonia 2012; 04/27/2012   Recurrent upper respiratory infection (URI)    Right leg DVT (HCC) 1982   groin   Seizures (HCC)    LAST SEIZURE 6 MONTHS OR  LONGER   Sleep  apnea    Type II diabetes mellitus (HCC)    No meds since weight loss   UTI (urinary tract infection)    2 weeks ago   Vertigo 10/24/2014    Past Surgical History:  Procedure Laterality Date   ABDOMINAL HYSTERECTOMY  2001   "partial"   BIOPSY  04/16/2021   Procedure: BIOPSY;  Surgeon: Napoleon Form, MD;  Location: [facility];  Service: Endoscopy;;   CARPAL TUNNEL RELEASE  ~ 2008   right   CARPAL TUNNEL RELEASE Right    CATARACT EXTRACTION Bilateral    COLONOSCOPY     COLONOSCOPY WITH ESOPHAGOGASTRODUODENOSCOPY (EGD)     CYSTOSCOPY/URETEROSCOPY/HOLMIUM LASER/STENT PLACEMENT Left 01/27/2023   Procedure: CYSTOSCOPY LEFT RETROGRADE PYELOGRAM, LEFT URETEROSCOPY, HOLMIUM LASER LITHOTRIPSY, AND LEFT URETERAL STENT PLACEMENT;  Surgeon: Bjorn Pippin, MD;  Location: [facility];  Service: Urology;  Laterality: Left;  60 MINUTES   ESOPHAGOGASTRODUODENOSCOPY (EGD) WITH PROPOFOL N/A 04/16/2021   Procedure: ESOPHAGOGASTRODUODENOSCOPY (EGD) WITH PROPOFOL;  Surgeon: Napoleon Form, MD;  Location: [facility];  Service: Endoscopy;  Laterality: N/A;   KNEE ARTHROSCOPY  1990's   left   NASAL SEPTOPLASTY W/ TURBINOPLASTY Bilateral 05/23/2016   Procedure: NASAL SEPTOPLASTY WITH TURBINATE REDUCTION;  Surgeon: Osborn Coho, MD;  Location[facility];  Service: ENT;  Laterality: Bilateral;   SHOULDER OPEN ROTATOR CUFF REPAIR  ~ 2010   left   SINOSCOPY     SINUS ENDO WITH FUSION Right 05/23/2016   Procedure: LIMITED RIGHT ENDOSCOPIC SINUS SURGERY;  Surgeon: Osborn Coho, MD;  Location: [facility] OR;  Service: ENT;  Laterality: Right;   TOTAL KNEE ARTHROPLASTY  08/23/2012   right   TOTAL KNEE ARTHROPLASTY  08/23/2012   Procedure: TOTAL KNEE ARTHROPLASTY;  Surgeon: Nilda Simmer, MD;  Location: [facility] OR;  Service: Orthopedics;  Laterality: Right;  RIGHT KNEE ARTHROPLASTY MEDIAL AND LATERAL COMPARTMENTS WITH PATELLA RESURFACING   TUBAL LIGATION  2001   UPPER GASTROINTESTINAL ENDOSCOPY      Social  History   Socioeconomic History   Marital status: Married    Spouse name: Westly Pam   Number of children: 2   Years of education: 12   Highest education level: Not on file  Occupational History   Occupation: disabled  Tobacco Use   Smoking status: Every Day    Current packs/day: 0.00    Average packs/day: 0.5 packs/day for 45.4 years (22.7 ttl pk-yrs)    Types: Cigarettes    Start date: 08/12/1975    Last attempt to quit: 12/30/2020    Years since quitting: 2.1   Smokeless tobacco: Never   Tobacco comments:    Smoking 6 per day.  Trying to quit.  01/13/2023  Vaping Use   Vaping status: Never Used  Substance and Sexual Activity   Alcohol use: No    Alcohol/week: 0.0 standard drinks of alcohol   Drug use: No   Sexual activity: Never  Other Topics Concern   Not on file  Social History Narrative   Patient lives at home with husband Westly Pam, her son and his wife.    Patient has 2 children.    Patient has a high school education.    Patient is currently unemployed.    Patient is right handed.    Patient drinks 2 cups of caffeine per day.   Social Determinants of Health   Financial Resource Strain: High Risk (09/13/2021)   Overall Financial Resource Strain (CARDIA)    Difficulty of Paying Living Expenses: Very hard  Food Insecurity: No Food Insecurity (02/04/2023)   Hunger Vital Sign    Worried About Running Out of Food in the Last Year: Never true    Ran Out of Food in the Last Year: Never true  Transportation Needs: No Transportation Needs (02/04/2023)   PRAPARE - Administrator, Civil Service (Medical): No    Lack of Transportation (Non-Medical): No  Physical Activity: Inactive (08/13/2021)   Exercise Vital Sign    Days of Exercise per Week: 0 days    Minutes of Exercise per Session: 0 min  Stress: No Stress Concern Present (01/08/2021)   Received from [facility] - Occupational Stress Questionnaire     Feeling of Stress : Not at all  Social Connections: Moderately Integrated (08/13/2021)   Social Connection and Isolation Panel [NHANES]    Frequency of Communication with Friends and Family: More than three times a week    Frequency of Social Gatherings with Friends and Family: More than three times a week    Attends Religious Services: Never    Database administrator or Organizations: Yes    Attends Banker Meetings: Never    Marital Status: Married  Catering manager Violence: Not At Risk (02/04/2023)   Humiliation, Afraid, Rape, and Kick questionnaire    Fear of Current or Ex-Partner: No    Emotionally Abused: No    Physically Abused: No    Sexually Abused: No    Family History  Problem Relation Age of Onset   Diabetes Mother    Lung cancer Mother        was a smoker   Stroke Mother    Alcohol abuse Father    Lung cancer Father        was a smoker   Emphysema Father        was a smoker   Heart disease Sister    Other Sister        blood clotting disorder   Diabetes Brother    Seizures Brother    Diabetes Maternal Aunt    Breast cancer Maternal Aunt    Esophageal cancer Maternal Uncle    Diabetes Maternal Uncle  Heart disease Maternal Grandfather    Diabetes Maternal Grandfather    Bipolar disorder Son    Muscular dystrophy Son    Heart disease Son    Heart disease Other        uncle   Colon cancer Neg Hx    Pancreatic cancer Neg Hx    Stomach cancer Neg Hx    Colon polyps Neg Hx    Rectal cancer Neg Hx     Anti-infectives: Anti-infectives (From admission, onward)    Start     Dose/Rate Route Frequency Ordered Stop   02/19/23 0000  fluconazole (DIFLUCAN) 150 MG tablet        150 mg Oral  Once 02/19/23 1340 02/19/23 2359       Current Outpatient Medications  Medication Sig Dispense Refill   albuterol (PROAIR HFA) 108 (90 Base) MCG/ACT inhaler INHALE TWO PUFFS INTO THE LUNGS EVERY 6 HOURS AS NEEDED FOR SHORTNESS OF BREATH (Patient taking  differently: Inhale 2 puffs into the lungs every 6 (six) hours as needed for shortness of breath or wheezing.) 18 g 6   albuterol (PROVENTIL) (2.5 MG/3ML) 0.083% nebulizer solution USE 1 VIAL IN NEBULIZER EVERY 6 HOURS AS NEEDED FOR SHORTNESS OF BREATH (Patient taking differently: Take 2.5 mg by nebulization every 6 (six) hours as needed for wheezing or shortness of breath.) 75 mL 8   allopurinol (ZYLOPRIM) 100 MG tablet Take 100 mg by mouth daily.     ALPRAZolam (XANAX) 0.25 MG tablet Take 0.25 mg by mouth 2 (two) times daily.     Azelastine HCl (ASTEPRO) 0.15 % SOLN Place 1 spray into the nose 2 (two) times daily as needed (Allergies). 30 mL 5   Cholecalciferol (VITAMIN D3) 50 MCG (2000 UT) capsule TAKE ONE CAPSULE BY MOUTH DAILY (Patient taking differently: Take 2,000 Units by mouth daily.) 90 capsule 1   famotidine (PEPCID) 40 MG tablet Take 40 mg by mouth at bedtime.     ferrous sulfate 324 (65 Fe) MG TBEC TAKE ONE TABLET BY MOUTH EVERY OTHER DAY (Patient taking differently: Take 324 mg by mouth every other day.) 90 tablet 5   fluconazole (DIFLUCAN) 150 MG tablet Take 1 tablet (150 mg total) by mouth once for 1 dose. 1 tablet 0   gabapentin (NEURONTIN) 100 MG capsule Take 1 capsule (100 mg total) by mouth at bedtime. (Patient taking differently: Take 100 mg by mouth 3 (three) times daily.) 90 capsule 3   guaiFENesin (MUCINEX) 600 MG 12 hr tablet Take 2 tablets (1,200 mg total) by mouth 2 (two) times daily as needed. (Patient taking differently: Take 1,200 mg by mouth 2 (two) times daily as needed for cough or to loosen phlegm.) 60 tablet 5   ipratropium-albuterol (DUONEB) 0.5-2.5 (3) MG/3ML SOLN Take 3 mLs by nebulization every 6 (six) hours as needed (Wheezing/sob).     lamoTRIgine (LAMICTAL) 200 MG tablet TAKE 1 AND 1/2 TABLETS BY MOUTH TWICE DAILY (Patient taking differently: Take 300 mg by mouth 2 (two) times daily.) 270 tablet 1   loratadine (ALLERGY RELIEF) 10 MG tablet Take 1 tablet (10 mg  total) by mouth daily. 90 tablet 3   montelukast (SINGULAIR) 10 MG tablet TAKE ONE TABLET BY MOUTH AT BEDTIME 30 tablet 5   ondansetron (ZOFRAN) 4 MG tablet Take 1 tablet (4 mg total) by mouth daily as needed for nausea or vomiting. 30 tablet 1   oxyCODONE-acetaminophen (PERCOCET/ROXICET) 5-325 MG tablet Take 1 tablet by mouth every 6 (six) hours as needed  for severe pain. 15 tablet 0   OXYGEN Inhale 3 L into the lungs continuous.     pantoprazole (PROTONIX) 40 MG tablet Take 1 tablet (40 mg total) by mouth 2 (two) times daily. **PLEASE CALL OFFICE TO SCHEDULE FOLLOW UP 60 tablet 0   QUEtiapine (SEROQUEL) 100 MG tablet TAKE ONE TABLET BY MOUTH AT BEDTIME (Patient taking differently: Take 100 mg by mouth at bedtime.) 30 tablet 5   simvastatin (ZOCOR) 40 MG tablet TAKE ONE TABLET BY MOUTH AT BEDTIME (Patient taking differently: Take 40 mg by mouth daily at 6 PM.) 90 tablet 2   topiramate (TOPAMAX) 100 MG tablet Take 200 mg twice daily (Patient taking differently: Take 200 mg by mouth 2 (two) times daily.) 360 tablet 2   TRELEGY ELLIPTA 100-62.5-25 MCG/ACT AEPB INHALE 1 PUFF BY MOUTH INTO THE LUNGS DAILY IN THE AFTERNOON (Patient taking differently: Inhale 1 puff into the lungs daily.) 60 each 5   triamcinolone cream (KENALOG) 0.1 % Apply 1 Application topically daily as needed (irritation).     XARELTO 20 MG TABS tablet Take 20 mg by mouth daily.     No current facility-administered medications for this visit.     Objective: Vital signs in last 24 hours: BP 102/64   Pulse 96   Intake/Output from previous day: No intake/output data recorded. Intake/Output this shift: @IOTHISSHIFT @   Physical Exam Vitals reviewed.  Constitutional:      Appearance: Normal appearance. She is obese.     Comments: In wheelchair.   Cardiovascular:     Rate and Rhythm: Normal rate and regular rhythm.  Pulmonary:     Effort: Pulmonary effort is normal. No respiratory distress.     Breath sounds: No wheezing  or rhonchi.  Abdominal:     Palpations: Abdomen is soft.     Tenderness: There is abdominal tenderness (LUQ). There is left CVA tenderness.  Musculoskeletal:        General: No swelling or tenderness. Normal range of motion.  Skin:    General: Skin is warm and dry.  Neurological:     General: No focal deficit present.     Mental Status: She is alert and oriented to person, place, and time.     Motor: Weakness (generalized requiring a wheelchair.) present.  Psychiatric:        Mood and Affect: Mood normal.        Behavior: Behavior normal.     Lab Results:  No results found. However, due to the size of the patient record, not all encounters were searched. Please check Results Review for a complete set of results.  BMET No results for input(s): "NA", "K", "CL", "CO2", "GLUCOSE", "BUN", "CREATININE", "CALCIUM" in the last 72 hours.  PT/INR No results for input(s): "LABPROT", "INR" in the last 72 hours. ABG No results for input(s): "PHART", "HCO3" in the last 72 hours.  Invalid input(s): "PCO2", "PO2"  Studies/Results: No results found.   Assessment/Plan: 8mm left renal stone without obstruction.  She is doing well post op and has removed the stent.   I will have her return in a month with a KUB and renal US.  Vaginal candidiasis.  Fluconazole sent.    Meds ordered this encounter  Medications   fluconazole (DIFLUCAN) 150 MG tablet    Sig: Take 1 tablet (150 mg total) by mouth once for 1 dose.    Dispense:  1 tablet    Refill:  0     Orders Placed This Encounter  Procedures   DG Abd 1 View    Standing Status:   Future    Standing Expiration Date:   08/22/2023    Order Specific Question:   Reason for Exam (SYMPTOM  OR DIAGNOSIS REQUIRED)    Answer:   left renal stone assess response to therapy.    Order Specific Question:   Preferred imaging location?    Answer:   [facility]    Order Specific Question:   Radiology Contrast Protocol - do NOT remove file path     Answer:   [email or web address]   US RENAL    Standing Status:   Future    Standing Expiration Date:   08/22/2023    Order Specific Question:   Reason for Exam (SYMPTOM  OR DIAGNOSIS REQUIRED)    Answer:   left renal stone assess response to therapy.    Order Specific Question:   Preferred imaging location?    Answer:   [facility]   Urinalysis, Routine w reflex microscopic   Calculi, with Photograph (to Clinical Lab)     Return in about 4 weeks (around 03/19/2023) for with KUB and renal US to see Maralyn Sago. .    CCColvin Caroli NP     Bjorn Pippin 02/19/2023

## 2023-02-20 ENCOUNTER — Encounter: Payer: Self-pay | Admitting: Urology

## 2023-03-02 LAB — CALCULI, WITH PHOTOGRAPH (CLINICAL LAB)
Calcium Oxalate Dihydrate: 40 %
Calcium Oxalate Monohydrate: 50 %
Hydroxyapatite: 10 %
Weight Calculi: 100 mg

## 2023-03-12 ENCOUNTER — Ambulatory Visit (HOSPITAL_COMMUNITY)
Admission: RE | Admit: 2023-03-12 | Discharge: 2023-03-12 | Disposition: A | Discharge status: Home / Self Care | Payer: 59 | Source: Ambulatory Visit | Attending: Urology | Admitting: Urology

## 2023-03-12 DIAGNOSIS — N200 Calculus of kidney: Secondary | ICD-10-CM | POA: Diagnosis present

## 2023-03-15 NOTE — Progress Notes (Deleted)
Name: [name] DOB: Jul 15, 1956 MRN: [id number]  History of Present Illness: [name] is a 67 y.o. female who presents today for follow up visit at [facility]. - GU History: 1. Kidney stones.  Recent history:  - 01/27/2023: Underwent cystoscopy with left retrograde pyelogram and left ureteroscopy with stone extraction by Dr. Annabell [name].  - 02/19/2023: Postop visit with Dr. Annabell [name]. Doing well; left ureteral stent removed. The plan was follow up in 1 month with a KUB and renal US. Also treated with Fluconazole for vaginal candidiasis.   Since last visit: RUS on 03/12/2023: Awaiting radiology read; ***   Today: ***KUB today: Awaiting radiology read; ***  She {Actions; denies-reports:120008} recent episode of stone pain / passage. She {Actions; denies-reports:120008} acute flank pain / abdominal pain. She {Actions; denies-reports:120008} fevers.  She {Actions; denies-reports:120008} nausea/ vomiting.  She urinates *** times per day. She {Actions; denies-reports:120008} urgency. She {Actions; denies-reports:120008} dysuria. She {Actions; denies-reports:120008} gross hematuria. She {Actions; denies-reports:120008} the need to strain to void. She {Actions; denies-reports:120008} sensations of incomplete emptying.  Fall Screening: Do you usually have a device to assist in your mobility? {yes/no:20286}  ***cane / ***walker / ***wheelchair  Medications: Current Outpatient Medications  Medication Sig Dispense Refill   albuterol (PROAIR HFA) 108 (90 Base) MCG/ACT inhaler INHALE TWO PUFFS INTO THE LUNGS EVERY 6 HOURS AS NEEDED FOR SHORTNESS OF BREATH (Patient taking differently: Inhale 2 puffs into the lungs every 6 (six) hours as needed for shortness of breath or wheezing.) 18 g 6   albuterol (PROVENTIL) (2.5 MG/3ML) 0.083% nebulizer solution USE 1 VIAL IN NEBULIZER EVERY 6 HOURS AS NEEDED FOR SHORTNESS OF BREATH (Patient taking differently: Take 2.5 mg by nebulization every 6  (six) hours as needed for wheezing or shortness of breath.) 75 mL 8   allopurinol (ZYLOPRIM) 100 MG tablet Take 100 mg by mouth daily.     ALPRAZolam (XANAX) 0.25 MG tablet Take 0.25 mg by mouth 2 (two) times daily.     Azelastine HCl (ASTEPRO) 0.15 % SOLN Place 1 spray into the nose 2 (two) times daily as needed (Allergies). 30 mL 5   Cholecalciferol (VITAMIN D3) 50 MCG (2000 UT) capsule TAKE ONE CAPSULE BY MOUTH DAILY (Patient taking differently: Take 2,000 Units by mouth daily.) 90 capsule 1   famotidine (PEPCID) 40 MG tablet Take 40 mg by mouth at bedtime.     ferrous sulfate 324 (65 Fe) MG TBEC TAKE ONE TABLET BY MOUTH EVERY OTHER DAY (Patient taking differently: Take 324 mg by mouth every other day.) 90 tablet 5   gabapentin (NEURONTIN) 100 MG capsule Take 1 capsule (100 mg total) by mouth at bedtime. (Patient taking differently: Take 100 mg by mouth 3 (three) times daily.) 90 capsule 3   guaiFENesin (MUCINEX) 600 MG 12 hr tablet Take 2 tablets (1,200 mg total) by mouth 2 (two) times daily as needed. (Patient taking differently: Take 1,200 mg by mouth 2 (two) times daily as needed for cough or to loosen phlegm.) 60 tablet 5   ipratropium-albuterol (DUONEB) 0.5-2.5 (3) MG/3ML SOLN Take 3 mLs by nebulization every 6 (six) hours as needed (Wheezing/sob).     lamoTRIgine (LAMICTAL) 200 MG tablet TAKE 1 AND 1/2 TABLETS BY MOUTH TWICE DAILY (Patient taking differently: Take 300 mg by mouth 2 (two) times daily.) 270 tablet 1   loratadine (ALLERGY RELIEF) 10 MG tablet Take 1 tablet (10 mg total) by mouth daily. 90 tablet 3   montelukast (SINGULAIR) 10 MG tablet TAKE  ONE TABLET BY MOUTH AT BEDTIME 30 tablet 5   ondansetron (ZOFRAN) 4 MG tablet Take 1 tablet (4 mg total) by mouth daily as needed for nausea or vomiting. 30 tablet 1   oxyCODONE-acetaminophen (PERCOCET/ROXICET) 5-325 MG tablet Take 1 tablet by mouth every 6 (six) hours as needed for severe pain. 15 tablet 0   OXYGEN Inhale 3 L into the lungs  continuous.     pantoprazole (PROTONIX) 40 MG tablet Take 1 tablet (40 mg total) by mouth 2 (two) times daily. **PLEASE CALL OFFICE TO SCHEDULE FOLLOW UP 60 tablet 0   QUEtiapine (SEROQUEL) 100 MG tablet TAKE ONE TABLET BY MOUTH AT BEDTIME (Patient taking differently: Take 100 mg by mouth at bedtime.) 30 tablet 5   simvastatin (ZOCOR) 40 MG tablet TAKE ONE TABLET BY MOUTH AT BEDTIME (Patient taking differently: Take 40 mg by mouth daily at 6 PM.) 90 tablet 2   topiramate (TOPAMAX) 100 MG tablet Take 200 mg twice daily (Patient taking differently: Take 200 mg by mouth 2 (two) times daily.) 360 tablet 2   TRELEGY ELLIPTA 100-62.5-25 MCG/ACT AEPB INHALE 1 PUFF BY MOUTH INTO THE LUNGS DAILY IN THE AFTERNOON (Patient taking differently: Inhale 1 puff into the lungs daily.) 60 each 5   triamcinolone cream (KENALOG) 0.1 % Apply 1 Application topically daily as needed (irritation).     XARELTO 20 MG TABS tablet Take 20 mg by mouth daily.     No current facility-administered medications for this visit.    Allergies: Allergies  Allergen Reactions   Amoxicillin Anaphylaxis    Breakout, mouth swells   Azithromycin Swelling and Rash    "swelling all over, hives, thrush"   Cefdinir     Throat swelling after 3rd dose   Chantix [Varenicline] Other (See Comments)    "bad dreams, difficulty breathing"   Doxycycline Hyclate Anaphylaxis and Rash    swelling   Levofloxacin Anaphylaxis    Pt can take moxifloxacin   Penicillins Anaphylaxis, Shortness Of Breath and Rash    "ALMOST DIED, ended up in hospital"    Sulfonamide Derivatives Shortness Of Breath, Swelling and Rash    "break out from head to toe"   Toradol [Ketorolac Tromethamine] Other (See Comments)    Seizure    Clarithromycin Hives    swelling   Nortriptyline Swelling, Rash and Other (See Comments)    Per patient had "kidney problems" on this medication, could not use bathroom (urinary retention)   Paxlovid [Nirmatrelvir-Ritonavir] Swelling     Throat swelling   Codeine Nausea And Vomiting   Fluticasone-Salmeterol Other (See Comments)    REACTION: ulcers in mouth   Triptans Rash    Mouth breaks out and start itching    Past Medical History:  Diagnosis Date   Allergic rhinitis    Anxiety    Arthritis    "hands and legs and feet" (04/27/2012)   Asthma    Cancer (HCC) 2005   rt arm mole rem cancer   CHF (congestive heart failure) (HCC)    Chronic bronchitis (HCC)    "keep it all the time" (04/27/2012)   Chronic kidney disease    Chronic kidney disease (CKD)    Stage III   COPD (chronic obstructive pulmonary disease) (HCC)    COVID-19 virus infection    03-2021   Depression    PT DENIES   Diverticulosis    DJD (degenerative joint disease)    Emphysema    Epilepsy (HCC)    Epilepsy (HCC)  Esophageal stricture    Exertional dyspnea    GERD (gastroesophageal reflux disease)    Glaucoma    H/O blood clots    Right leg   Heart failure (HCC)    History of COVID-19 03/2021   History of kidney stones    History of nuclear stress test    Myoview 12/25/22: EF 74, no ischemia or infarction, low risk   Hyperlipidemia    Hypertension    IBS (irritable bowel syndrome)    Migraine    Neck pain    Cervical disc   OSA on CPAP    6-7 yrs; pt wears CPAP but has not been able to use it lately due to sinus issues   Oxygen deficiency    USES 3 LITERS 02 CONTINOUS   Pneumonia 2012; 04/27/2012   Recurrent upper respiratory infection (URI)    Right leg DVT (HCC) 1982   groin   Seizures (HCC)    LAST SEIZURE 6 MONTHS OR  LONGER   Sleep apnea    Type II diabetes mellitus (HCC)    No meds since weight loss   UTI (urinary tract infection)    2 weeks ago   Vertigo 10/24/2014   Past Surgical History:  Procedure Laterality Date   ABDOMINAL HYSTERECTOMY  2001   "partial"   BIOPSY  04/16/2021   Procedure: BIOPSY;  Surgeon: Napoleon Form, MD;  Location: [facility];  Service: Endoscopy;;   CARPAL TUNNEL RELEASE  ~  2008   right   CARPAL TUNNEL RELEASE Right    CATARACT EXTRACTION Bilateral    COLONOSCOPY     COLONOSCOPY WITH ESOPHAGOGASTRODUODENOSCOPY (EGD)     CYSTOSCOPY/URETEROSCOPY/HOLMIUM LASER/STENT PLACEMENT Left 01/27/2023   Procedure: CYSTOSCOPY LEFT RETROGRADE PYELOGRAM, LEFT URETEROSCOPY, HOLMIUM LASER LITHOTRIPSY, AND LEFT URETERAL STENT PLACEMENT;  Surgeon: Bjorn Pippin, MD;  Location: [facility];  Service: Urology;  Laterality: Left;  60 MINUTES   ESOPHAGOGASTRODUODENOSCOPY (EGD) WITH PROPOFOL N/A 04/16/2021   Procedure: ESOPHAGOGASTRODUODENOSCOPY (EGD) WITH PROPOFOL;  Surgeon: Napoleon Form, MD;  Location: [facility];  Service: Endoscopy;  Laterality: N/A;   KNEE ARTHROSCOPY  1990's   left   NASAL SEPTOPLASTY W/ TURBINOPLASTY Bilateral 05/23/2016   Procedure: NASAL SEPTOPLASTY WITH TURBINATE REDUCTION;  Surgeon: Osborn Coho, MD;  Location: [facility] OR;  Service: ENT;  Laterality: Bilateral;   SHOULDER OPEN ROTATOR CUFF REPAIR  ~ 2010   left   SINOSCOPY     SINUS ENDO WITH FUSION Right 05/23/2016   Procedure: LIMITED RIGHT ENDOSCOPIC SINUS SURGERY;  Surgeon: Osborn Coho, MD;  Location: [facility] OR;  Service: ENT;  Laterality: Right;   TOTAL KNEE ARTHROPLASTY  08/23/2012   right   TOTAL KNEE ARTHROPLASTY  08/23/2012   Procedure: TOTAL KNEE ARTHROPLASTY;  Surgeon: Nilda Simmer, MD;  Location: [facility] OR;  Service: Orthopedics;  Laterality: Right;  RIGHT KNEE ARTHROPLASTY MEDIAL AND LATERAL COMPARTMENTS WITH PATELLA RESURFACING   TUBAL LIGATION  2001   UPPER GASTROINTESTINAL ENDOSCOPY     Family History  Problem Relation Age of Onset   Diabetes Mother    Lung cancer Mother        was a smoker   Stroke Mother    Alcohol abuse Father    Lung cancer Father        was a smoker   Emphysema Father        was a smoker   Heart disease Sister    Other Sister        blood clotting disorder  Diabetes Brother    Seizures Brother    Diabetes Maternal Aunt    Breast cancer Maternal Aunt     Esophageal cancer Maternal Uncle    Diabetes Maternal Uncle    Heart disease Maternal Grandfather    Diabetes Maternal Grandfather    Bipolar disorder Son    Muscular dystrophy Son    Heart disease Son    Heart disease Other        uncle   Colon cancer Neg Hx    Pancreatic cancer Neg Hx    Stomach cancer Neg Hx    Colon polyps Neg Hx    Rectal cancer Neg Hx    Social History   Socioeconomic History   Marital status: Married    Spouse name: Westly Pam   Number of children: 2   Years of education: 12   Highest education level: Not on file  Occupational History   Occupation: disabled  Tobacco Use   Smoking status: Every Day    Current packs/day: 0.00    Average packs/day: 0.5 packs/day for 45.4 years (22.7 ttl pk-yrs)    Types: Cigarettes    Start date: 08/12/1975    Last attempt to quit: 12/30/2020    Years since quitting: 2.2   Smokeless tobacco: Never   Tobacco comments:    Smoking 6 per day.  Trying to quit.  01/13/2023  Vaping Use   Vaping status: Never Used  Substance and Sexual Activity   Alcohol use: No    Alcohol/week: 0.0 standard drinks of alcohol   Drug use: No   Sexual activity: Never  Other Topics Concern   Not on file  Social History Narrative   Patient lives at home with husband Westly Pam, her son and his wife.    Patient has 2 children.    Patient has a high school education.    Patient is currently unemployed.    Patient is right handed.    Patient drinks 2 cups of caffeine per day.   Social Determinants of Health   Financial Resource Strain: High Risk (09/13/2021)   Overall Financial Resource Strain (CARDIA)    Difficulty of Paying Living Expenses: Very hard  Food Insecurity: No Food Insecurity (02/04/2023)   Hunger Vital Sign    Worried About Running Out of Food in the Last Year: Never true    Ran Out of Food in the Last Year: Never true  Transportation Needs: No Transportation Needs (02/04/2023)   PRAPARE - Administrator, Civil Service  (Medical): No    Lack of Transportation (Non-Medical): No  Physical Activity: Inactive (08/13/2021)   Exercise Vital Sign    Days of Exercise per Week: 0 days    Minutes of Exercise per Session: 0 min  Stress: No Stress Concern Present (01/08/2021)   Received from [facility], [facility] of Occupational Health - Occupational Stress Questionnaire    Feeling of Stress : Not at all  Social Connections: Moderately Integrated (08/13/2021)   Social Connection and Isolation Panel [NHANES]    Frequency of Communication with Friends and Family: More than three times a week    Frequency of Social Gatherings with Friends and Family: More than three times a week    Attends Religious Services: Never    Database administrator or Organizations: Yes    Attends Banker Meetings: Never    Marital Status: Married  Catering manager Violence: Not At Risk (02/04/2023)   Humiliation,  Afraid, Rape, and Kick questionnaire    Fear of Current or Ex-Partner: No    Emotionally Abused: No    Physically Abused: No    Sexually Abused: No    SUBJECTIVE  Review of Systems Constitutional: Patient ***denies any unintentional weight loss or change in strength lntegumentary: Patient ***denies any rashes or pruritus Eyes: Patient denies ***dry eyes ENT: Patient ***denies dry mouth Cardiovascular: Patient ***denies chest pain or syncope Respiratory: Patient ***denies shortness of breath Gastrointestinal: Patient ***denies nausea, vomiting, constipation, or diarrhea Musculoskeletal: Patient ***denies muscle cramps or weakness Neurologic: Patient ***denies convulsions or seizures Psychiatric: Patient ***denies memory problems Allergic/Immunologic: Patient ***denies recent allergic reaction(s) Hematologic/Lymphatic: Patient denies bleeding tendencies Endocrine: Patient ***denies heat/cold intolerance  GU: As per HPI.  OBJECTIVE There were no vitals filed for this visit. There  is no height or weight on file to calculate BMI.  Physical Examination  Constitutional: ***No obvious distress; patient is ***non-toxic appearing  Cardiovascular: ***No visible lower extremity edema.  Respiratory: The patient does ***not have audible wheezing/stridor; respirations do ***not appear labored  Gastrointestinal: Abdomen ***non-distended Musculoskeletal: ***Normal ROM of UEs  Skin: ***No obvious rashes/open sores  Neurologic: CN 2-12 grossly ***intact Psychiatric: Answered questions ***appropriately with ***normal affect  Hematologic/Lymphatic/Immunologic: ***No obvious bruises or sites of spontaneous bleeding  UA: {Desc; negative/positive:13464} for *** WBC/hpf, *** RBC/hpf, bacteria (***) PVR: *** ml  ASSESSMENT No diagnosis found.  ***We reviewed recent imaging results; ***awaiting radiology results, appears to have ***no acute findings.  ***For stone prevention: Advised adequate hydration and we discussed option to consider low oxalate diet given that calcium oxalate is the most common type of stone. Handout provided about stone prevention diet.  ***For recurrent stone formers: We discussed option to proceed with 24 hour urinalysis ([facility]) for metabolic evaluation, which may help with targeted recommendations for dietary I medication therapies for stone prevention. Patient elected to ***proceed/ ***hold off.  Will plan to follow up in ***6 months / ***1 year with ***KUB ***RUS for stone surveillance or sooner if needed.  Pt verbalized understanding and agreement. All questions were answered.  PLAN Advised the following: ***Maintain adequate fluid intake. ***Low oxalate diet. No follow-ups on file.  No orders of the defined types were placed in this encounter.   It has been explained that the patient is to follow regularly with their PCP in addition to all other providers involved in their care and to follow instructions provided by these respective offices.  Patient advised to contact urology clinic if any urologic-pertaining questions, concerns, new symptoms or problems arise in the interim period.  There are no Patient Instructions on file for this visit.  Electronically signed by:  Donnita Falls, MSN, FNP-C, CUNP 03/15/2023 10:59 AM

## 2023-03-18 ENCOUNTER — Ambulatory Visit: Payer: 59 | Admitting: Urology

## 2023-03-18 DIAGNOSIS — N200 Calculus of kidney: Secondary | ICD-10-CM

## 2023-03-23 IMAGING — MR MR HUMERUS*L* WO/W CM
6 of 9 series · 24 of 40 positions shown · IV contrast (17ml Multihance)
Comparison: None.

CLINICAL DATA: Left upper arm mass with pain and weakness for 4
months.

EXAM:
MRI OF THE LEFT HUMERUS WITHOUT AND WITH CONTRAST
TECHNIQUE: Multiplanar, multisequence MR imaging of the left upper arm was
performed before and after the administration of intravenous
contrast.
CONTRAST:  17mL MULTIHANCE GADOBENATE DIMEGLUMINE 529 MG/ML IV SOLN

[Series 12: T2 fat-sat · oblique · 4.0mm · 0.62mm/px · 4 of 26 slices shown (1 of 2)]
[im 1/26]
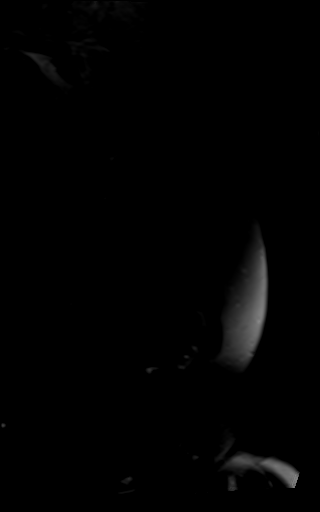
[im 9/26]
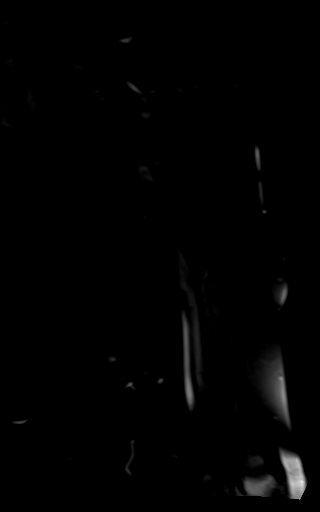
[im 17/26]
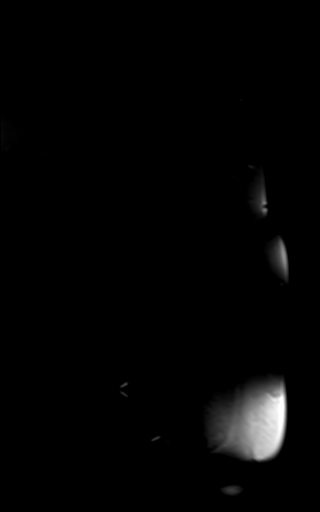
[im 26/26]
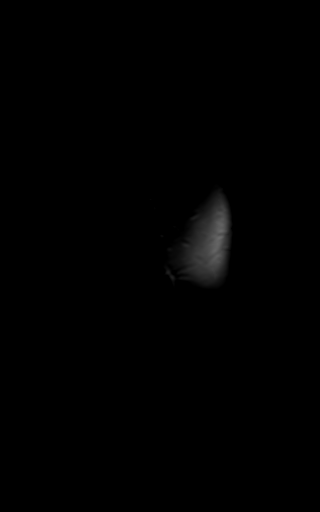

[Series 13: T1 · oblique · 4.0mm · 1.25mm/px · 3 of 26 slices shown (1 of 3)]
[im 1/26]
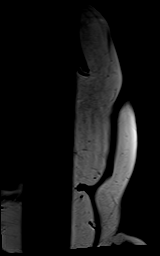
[im 13/26]
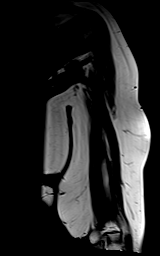
[im 26/26]
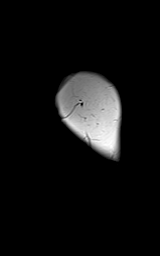

[Series 16: T1 · axial · 5.5mm · 0.35mm/px · z∈[-282,-0]mm · 6 of 47 slices shown (2 of 3)]
[im 1/47]
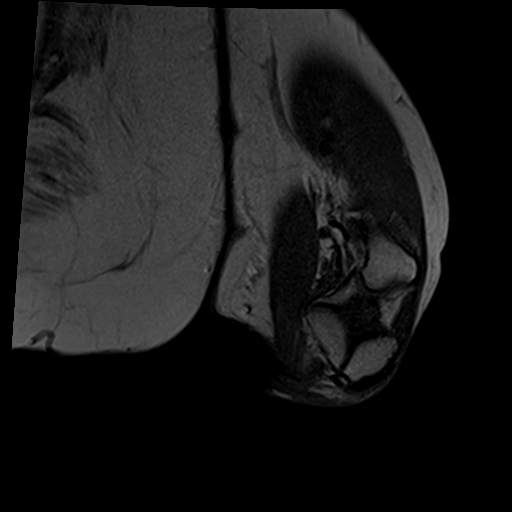
[im 10/47]
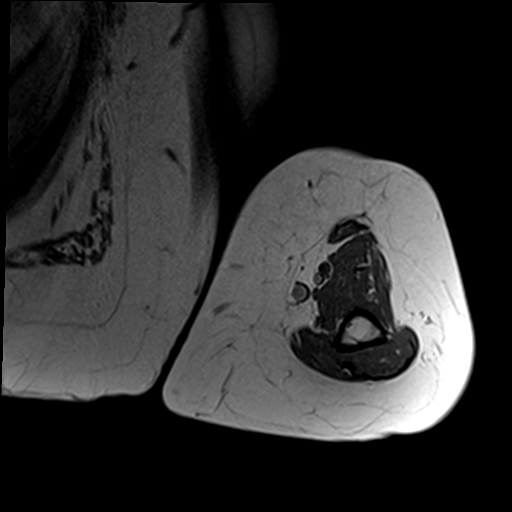
[im 19/47]
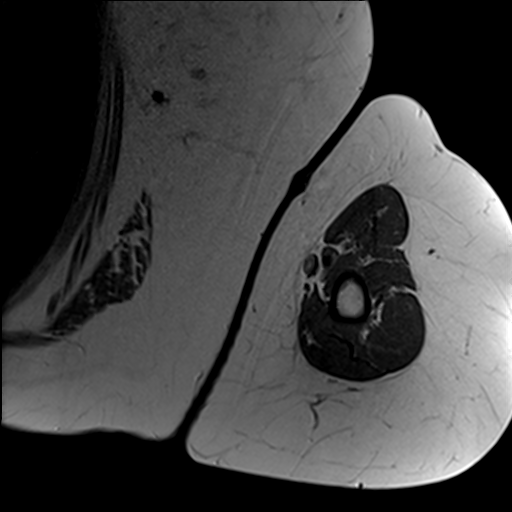
[im 28/47]
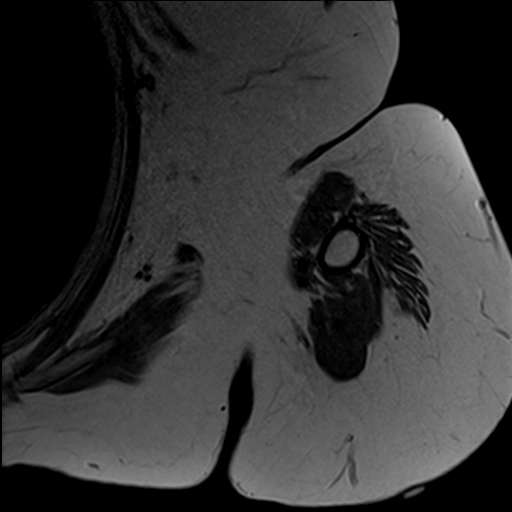
[im 37/47]
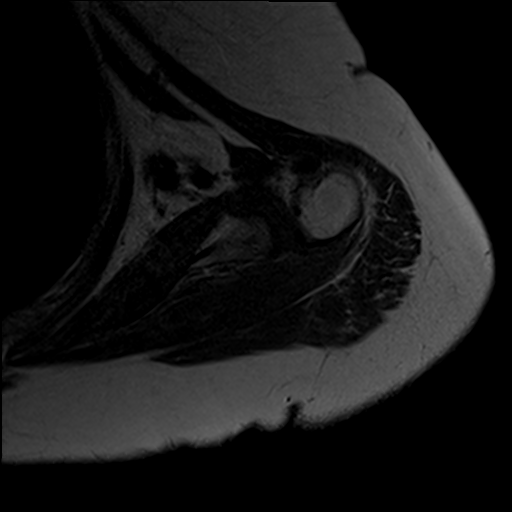
[im 47/47]
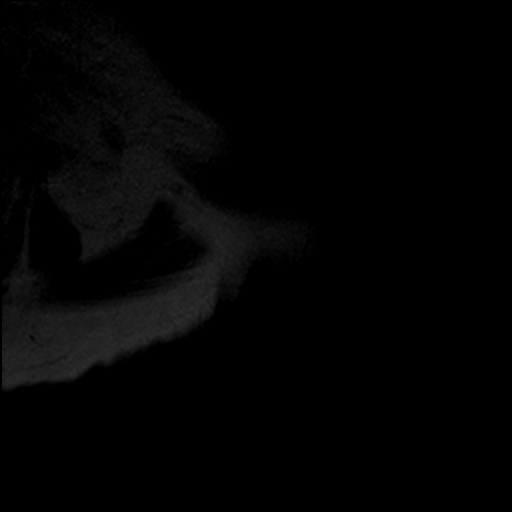

[Series 17: T1 · oblique · 6.0mm · 1.29mm/px · 3 of 24 slices shown (3 of 3)]
[im 1/24]
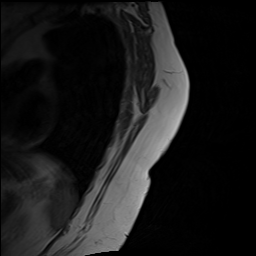
[im 12/24]
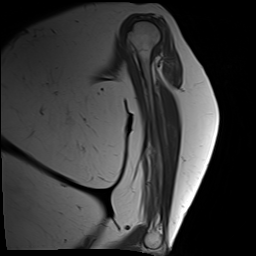
[im 24/24]
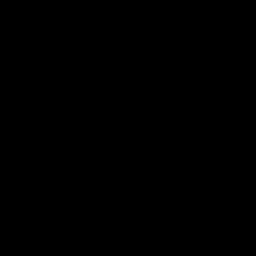

[Series 18: T2 fat-sat · axial · 5.5mm · 0.35mm/px · z∈[-282,-0]mm · 6 of 47 slices shown (2 of 2)]
[im 1/47]
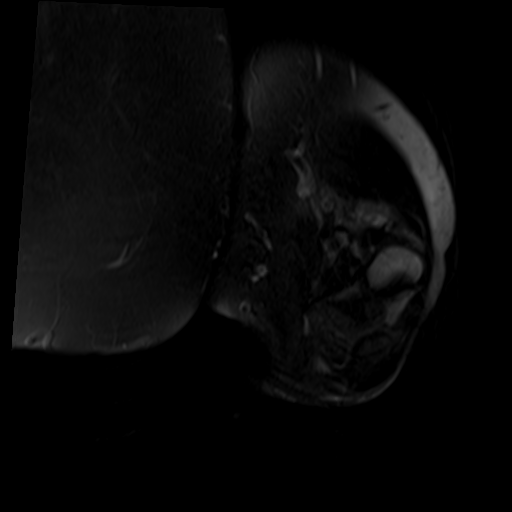
[im 10/47]
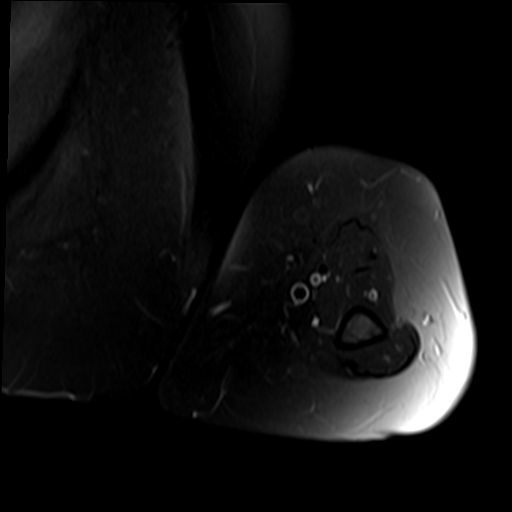
[im 19/47]
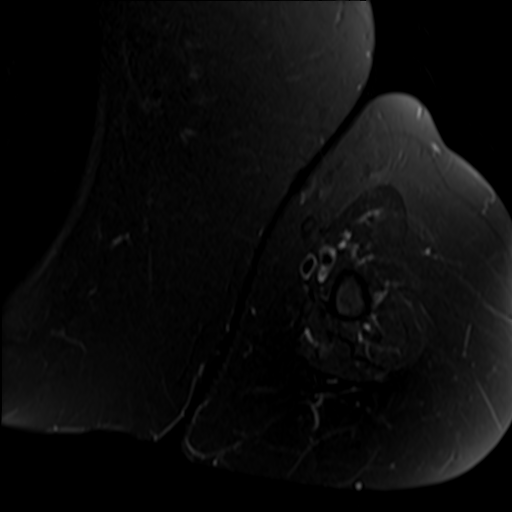
[im 28/47]
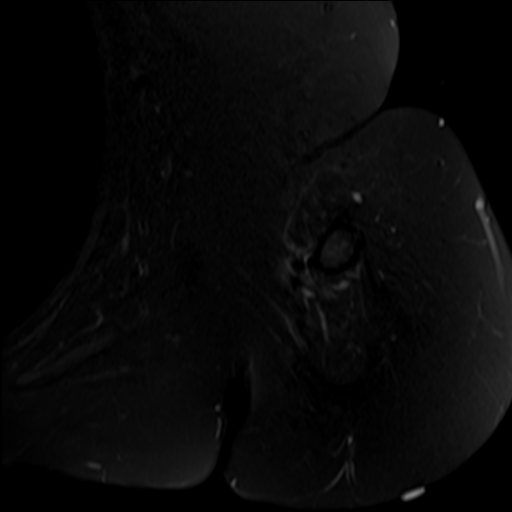
[im 37/47]
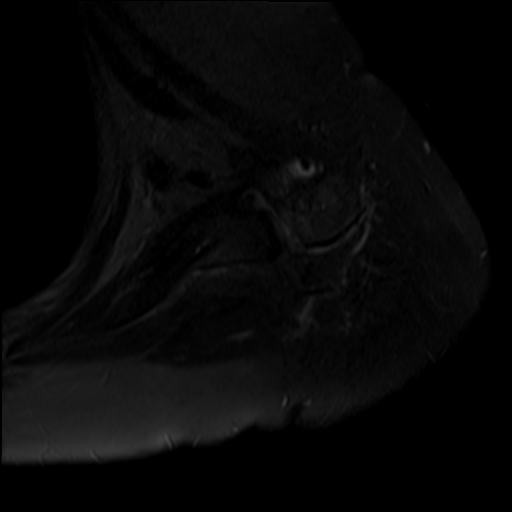
[im 47/47]
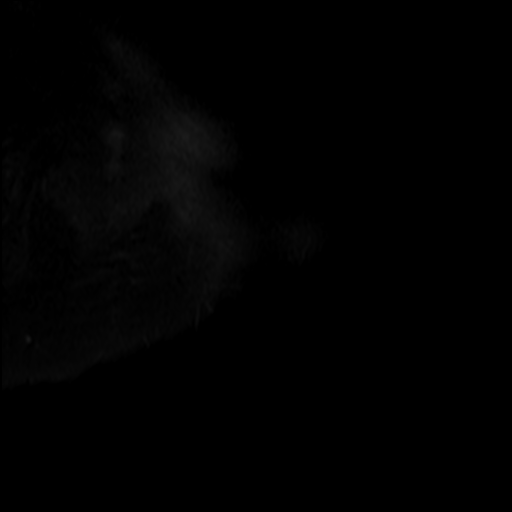

[Series 19: T1 fat-sat · axial · non-contrast · 5.5mm · 0.35mm/px · z∈[-282,-227]mm · 2 of 47 slices shown]
[im 1/47]
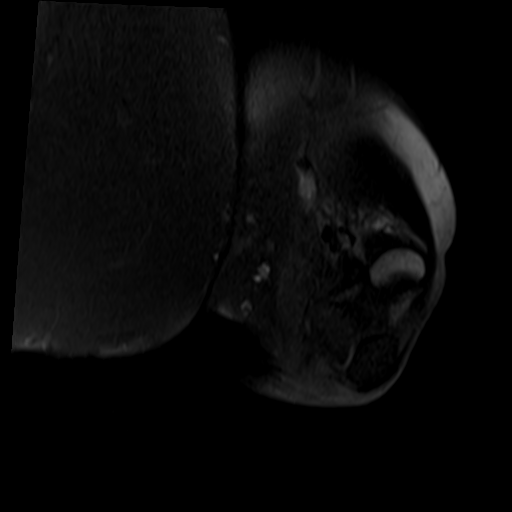
[im 10/47]
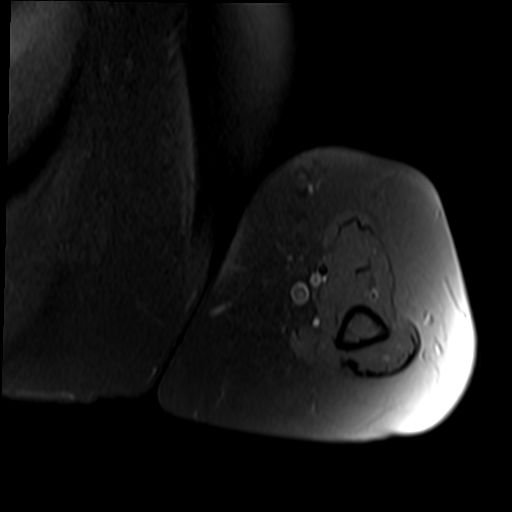

[24 of 40 positions shown; findings below may reference images not displayed]

FINDINGS: Despite efforts by the technologist and patient, mild motion
artifact is present on today's exam and could not be eliminated.
This reduces exam sensitivity and specificity.

Bones/Joint/Cartilage

No evidence of acute fracture, dislocation or focal osseous lesion.
Mild left glenohumeral arthropathy with subchondral edema and
enhancement posteriorly in the humeral head, a small left shoulder
joint effusion and mild synovial enhancement following contrast. No
cortical destruction identified.

Ligaments

Not relevant for exam/indication.

Muscles and Tendons

No focal muscular atrophy, edema or abnormal enhancement identified.
The proximal and distal biceps tendons are incompletely visualized,
although grossly intact.

Soft tissues

Markers were placed posteriorly around the patient's palpable
concern in the mid to distal upper arm. The subcutaneous fat is
prominent in this region, although no well-defined lipoma is
identified. There is no other soft tissue mass, fluid collection or
abnormal enhancement.
IMPRESSION: 1. The palpable concern corresponds with prominent subcutaneous fat
posteriorly in the mid to distal upper arm. No discrete lipoma,
other soft tissue mass or fluid collection identified. Clinical
follow up of any palpable concern recommended.
2. Glenohumeral arthropathy with mild synovial enhancement. Suggest
radiographic correlation.
3. No musculotendinous abnormalities identified.

## 2023-04-10 ENCOUNTER — Ambulatory Visit: Payer: 59 | Admitting: Urology

## 2023-04-22 ENCOUNTER — Telehealth: Payer: Self-pay | Admitting: Pulmonary Disease

## 2023-04-22 NOTE — Telephone Encounter (Signed)
Patient called to schedule 6 month follow up in November. Patient states she is having a little issue with her breathing she thinks due to weather and requesting prednisone sent to [facility] in [place]. Please advise.

## 2023-04-23 ENCOUNTER — Other Ambulatory Visit: Payer: Self-pay

## 2023-04-23 MED ORDER — PREDNISONE 10 MG PO TABS: 10.0000 mg | ORAL_TABLET | Freq: Every day | ORAL | 0 refills | Status: DC

## 2023-04-23 NOTE — Telephone Encounter (Signed)
Prednisone 10 mg take  4 each am x 2 days,   2 each am x 2 days,  1 each am x 2 days and stop  

## 2023-04-23 NOTE — Telephone Encounter (Signed)
Sen rx to the pharmacy for pt , called and informed patient

## 2023-04-23 NOTE — Telephone Encounter (Signed)
Called and spoke w/ pt she said that she has been having SOB over the last couple of days, no coughing , or congestion, no fever fever. Stated that she isn't having any tightness in chest or wheezing. She is using her albuterol inhaler q4 as needed and neb. Treatment , she wanted to know if she could have rx for prednisone before the SOB got any worse .  Please advise

## 2023-04-30 NOTE — Progress Notes (Signed)
Name: [name] DOB: 1955/10/01 MRN: [id number]  History of Present Illness: [name] is a 67 y.o. female who presents today for follow up visit at [facility] [place]. She is accompanied by her grandson [name]. - GU History: 1. Kidney stones. 2. Recurrent UTIs.   Urine culture results in past 12 months: - 08/03/2022: Positive for Klebsiella pneumoniae - No additional positive urine culture results in past 12 months found per chart review.  Recent history: - 01/27/2023: Underwent left ureteroscopic stone manipulation by Dr. Annabell Howells. - 02/19/2023: Postop check with Dr. Annabell Howells. Doing well. Stone analysis showed 90% calcium oxalate and 10% hydroxyapatite composition.  - 03/12/2023: Normal RUS - no stones, masses, or hydronephrosis.   Today: She denies acute flank pain / abdominal pain. She denies fevers.  She denies nausea/ vomiting.  She denies increased urinary urgency, frequency, nocturia, dysuria, gross hematuria, hesitancy, straining to void, or sensations of incomplete emptying.   Fall Screening: Do you usually have a device to assist in your mobility? Yes   Medications: Current Outpatient Medications  Medication Sig Dispense Refill   albuterol (PROAIR HFA) 108 (90 Base) MCG/ACT inhaler INHALE TWO PUFFS INTO THE LUNGS EVERY 6 HOURS AS NEEDED FOR SHORTNESS OF BREATH (Patient taking differently: Inhale 2 puffs into the lungs every 6 (six) hours as needed for shortness of breath or wheezing.) 18 g 6   albuterol (PROVENTIL) (2.5 MG/3ML) 0.083% nebulizer solution USE 1 VIAL IN NEBULIZER EVERY 6 HOURS AS NEEDED FOR SHORTNESS OF BREATH (Patient taking differently: Take 2.5 mg by nebulization every 6 (six) hours as needed for wheezing or shortness of breath.) 75 mL 8   allopurinol (ZYLOPRIM) 100 MG tablet Take 100 mg by mouth daily.     ALPRAZolam (XANAX) 0.25 MG tablet Take 0.25 mg by mouth 2 (two) times daily.     Azelastine HCl (ASTEPRO) 0.15 % SOLN Place 1 spray into the  nose 2 (two) times daily as needed (Allergies). 30 mL 5   Cholecalciferol (VITAMIN D3) 50 MCG (2000 UT) capsule TAKE ONE CAPSULE BY MOUTH DAILY (Patient taking differently: Take 2,000 Units by mouth daily.) 90 capsule 1   famotidine (PEPCID) 40 MG tablet Take 40 mg by mouth at bedtime.     ferrous sulfate 324 (65 Fe) MG TBEC TAKE ONE TABLET BY MOUTH EVERY OTHER DAY (Patient taking differently: Take 324 mg by mouth every other day.) 90 tablet 5   gabapentin (NEURONTIN) 100 MG capsule Take 1 capsule (100 mg total) by mouth at bedtime. (Patient taking differently: Take 100 mg by mouth 3 (three) times daily.) 90 capsule 3   guaiFENesin (MUCINEX) 600 MG 12 hr tablet Take 2 tablets (1,200 mg total) by mouth 2 (two) times daily as needed. (Patient taking differently: Take 1,200 mg by mouth 2 (two) times daily as needed for cough or to loosen phlegm.) 60 tablet 5   ipratropium-albuterol (DUONEB) 0.5-2.5 (3) MG/3ML SOLN Take 3 mLs by nebulization every 6 (six) hours as needed (Wheezing/sob).     lamoTRIgine (LAMICTAL) 200 MG tablet TAKE 1 AND 1/2 TABLETS BY MOUTH TWICE DAILY (Patient taking differently: Take 300 mg by mouth 2 (two) times daily.) 270 tablet 1   loratadine (ALLERGY RELIEF) 10 MG tablet Take 1 tablet (10 mg total) by mouth daily. 90 tablet 3   montelukast (SINGULAIR) 10 MG tablet TAKE ONE TABLET BY MOUTH AT BEDTIME 30 tablet 5   ondansetron (ZOFRAN) 4 MG tablet Take 1 tablet (4 mg total) by mouth daily  as needed for nausea or vomiting. 30 tablet 1   oxyCODONE-acetaminophen (PERCOCET/ROXICET) 5-325 MG tablet Take 1 tablet by mouth every 6 (six) hours as needed for severe pain. 15 tablet 0   OXYGEN Inhale 3 L into the lungs continuous.     pantoprazole (PROTONIX) 40 MG tablet Take 1 tablet (40 mg total) by mouth 2 (two) times daily. **PLEASE CALL OFFICE TO SCHEDULE FOLLOW UP 60 tablet 0   predniSONE (DELTASONE) 10 MG tablet Take 1 tablet (10 mg total) by mouth daily with breakfast. 10 mg take  4  each am x 2 days,   2 each am x 2 days,  1 each am x 2 days and stop 20 tablet 0   QUEtiapine (SEROQUEL) 100 MG tablet TAKE ONE TABLET BY MOUTH AT BEDTIME (Patient taking differently: Take 100 mg by mouth at bedtime.) 30 tablet 5   simvastatin (ZOCOR) 40 MG tablet TAKE ONE TABLET BY MOUTH AT BEDTIME (Patient taking differently: Take 40 mg by mouth daily at 6 PM.) 90 tablet 2   topiramate (TOPAMAX) 100 MG tablet Take 200 mg twice daily (Patient taking differently: Take 200 mg by mouth 2 (two) times daily.) 360 tablet 2   TRELEGY ELLIPTA 100-62.5-25 MCG/ACT AEPB INHALE 1 PUFF BY MOUTH INTO THE LUNGS DAILY IN THE AFTERNOON (Patient taking differently: Inhale 1 puff into the lungs daily.) 60 each 5   triamcinolone cream (KENALOG) 0.1 % Apply 1 Application topically daily as needed (irritation).     XARELTO 20 MG TABS tablet Take 20 mg by mouth daily.     No current facility-administered medications for this visit.    Allergies: Allergies  Allergen Reactions   Amoxicillin Anaphylaxis    Breakout, mouth swells   Azithromycin Swelling and Rash    "swelling all over, hives, thrush"   Cefdinir     Throat swelling after 3rd dose   Chantix [Varenicline] Other (See Comments)    "bad dreams, difficulty breathing"   Doxycycline Hyclate Anaphylaxis and Rash    swelling   Levofloxacin Anaphylaxis    Pt can take moxifloxacin   Penicillins Anaphylaxis, Shortness Of Breath and Rash    "ALMOST DIED, ended up in hospital"    Sulfonamide Derivatives Shortness Of Breath, Swelling and Rash    "break out from head to toe"   Toradol [Ketorolac Tromethamine] Other (See Comments)    Seizure    Clarithromycin Hives    swelling   Nortriptyline Swelling, Rash and Other (See Comments)    Per patient had "kidney problems" on this medication, could not use bathroom (urinary retention)   Paxlovid [Nirmatrelvir-Ritonavir] Swelling    Throat swelling   Codeine Nausea And Vomiting   Fluticasone-Salmeterol Other  (See Comments)    REACTION: ulcers in mouth   Triptans Rash    Mouth breaks out and start itching    Past Medical History:  Diagnosis Date   Allergic rhinitis    Anxiety    Arthritis    "hands and legs and feet" (04/27/2012)   Asthma    Cancer (HCC) 2005   rt arm mole rem cancer   CHF (congestive heart failure) (HCC)    Chronic bronchitis (HCC)    "keep it all the time" (04/27/2012)   Chronic kidney disease    Chronic kidney disease (CKD)    Stage III   COPD (chronic obstructive pulmonary disease) (HCC)    COVID-19 virus infection    03-2021   Depression    PT DENIES  Diverticulosis    DJD (degenerative joint disease)    Emphysema    Epilepsy (HCC)    Epilepsy (HCC)    Esophageal stricture    Exertional dyspnea    GERD (gastroesophageal reflux disease)    Glaucoma    H/O blood clots    Right leg   Heart failure (HCC)    History of COVID-19 03/2021   History of kidney stones    History of nuclear stress test    Myoview 12/25/22: EF 74, no ischemia or infarction, low risk   Hyperlipidemia    Hypertension    IBS (irritable bowel syndrome)    Migraine    Neck pain    Cervical disc   OSA on CPAP    6-7 yrs; pt wears CPAP but has not been able to use it lately due to sinus issues   Oxygen deficiency    USES 3 LITERS 02 CONTINOUS   Pneumonia 2012; 04/27/2012   Recurrent upper respiratory infection (URI)    Right leg DVT (HCC) 1982   groin   Seizures (HCC)    LAST SEIZURE 6 MONTHS OR  LONGER   Sleep apnea    Type II diabetes mellitus (HCC)    No meds since weight loss   UTI (urinary tract infection)    2 weeks ago   Vertigo 10/24/2014   Past Surgical History:  Procedure Laterality Date   ABDOMINAL HYSTERECTOMY  2001   "partial"   BIOPSY  04/16/2021   Procedure: BIOPSY;  Surgeon: Napoleon Form, MD;  Location: [facility];  Service: Endoscopy;;   CARPAL TUNNEL RELEASE  ~ 2008   right   CARPAL TUNNEL RELEASE Right    CATARACT EXTRACTION Bilateral     COLONOSCOPY     COLONOSCOPY WITH ESOPHAGOGASTRODUODENOSCOPY (EGD)     CYSTOSCOPY/URETEROSCOPY/HOLMIUM LASER/STENT PLACEMENT Left 01/27/2023   Procedure: CYSTOSCOPY LEFT RETROGRADE PYELOGRAM, LEFT URETEROSCOPY, HOLMIUM LASER LITHOTRIPSY, AND LEFT URETERAL STENT PLACEMENT;  Surgeon: Bjorn Pippin, MD;  Location: [facility];  Service: Urology;  Laterality: Left;  60 MINUTES   ESOPHAGOGASTRODUODENOSCOPY (EGD) WITH PROPOFOL N/A 04/16/2021   Procedure: ESOPHAGOGASTRODUODENOSCOPY (EGD) WITH PROPOFOL;  Surgeon: Napoleon Form, MD;  Location: [facility];  Service: Endoscopy;  Laterality: N/A;   KNEE ARTHROSCOPY  1990's   left   NASAL SEPTOPLASTY W/ TURBINOPLASTY Bilateral 05/23/2016   Procedure: NASAL SEPTOPLASTY WITH TURBINATE REDUCTION;  Surgeon: Osborn Coho, MD;  Location: [facility] OR;  Service: ENT;  Laterality: Bilateral;   SHOULDER OPEN ROTATOR CUFF REPAIR  ~ 2010   left   SINOSCOPY     SINUS ENDO WITH FUSION Right 05/23/2016   Procedure: LIMITED RIGHT ENDOSCOPIC SINUS SURGERY;  Surgeon: Osborn Coho, MD;  Location: [facility] OR;  Service: ENT;  Laterality: Right;   TOTAL KNEE ARTHROPLASTY  08/23/2012   right   TOTAL KNEE ARTHROPLASTY  08/23/2012   Procedure: TOTAL KNEE ARTHROPLASTY;  Surgeon: Nilda Simmer, MD;  Location: [facility] OR;  Service: Orthopedics;  Laterality: Right;  RIGHT KNEE ARTHROPLASTY MEDIAL AND LATERAL COMPARTMENTS WITH PATELLA RESURFACING   TUBAL LIGATION  2001   UPPER GASTROINTESTINAL ENDOSCOPY     Family History  Problem Relation Age of Onset   Diabetes Mother    Lung cancer Mother        was a smoker   Stroke Mother    Alcohol abuse Father    Lung cancer Father        was a smoker   Emphysema Father  was a smoker   Heart disease Sister    Other Sister        blood clotting disorder   Diabetes Brother    Seizures Brother    Diabetes Maternal Aunt    Breast cancer Maternal Aunt    Esophageal cancer Maternal Uncle    Diabetes Maternal Uncle    Heart disease  Maternal Grandfather    Diabetes Maternal Grandfather    Bipolar disorder Son    Muscular dystrophy Son    Heart disease Son    Heart disease Other        uncle   Colon cancer Neg Hx    Pancreatic cancer Neg Hx    Stomach cancer Neg Hx    Colon polyps Neg Hx    Rectal cancer Neg Hx    Social History   Socioeconomic History   Marital status: Married    Spouse name: Westly Pam   Number of children: 2   Years of education: 12   Highest education level: Not on file  Occupational History   Occupation: disabled  Tobacco Use   Smoking status: Every Day    Current packs/day: 0.00    Average packs/day: 0.5 packs/day for 45.4 years (22.7 ttl pk-yrs)    Types: Cigarettes    Start date: 08/12/1975    Last attempt to quit: 12/30/2020    Years since quitting: 2.3   Smokeless tobacco: Never   Tobacco comments:    Smoking 6 per day.  Trying to quit.  01/13/2023  Vaping Use   Vaping status: Never Used  Substance and Sexual Activity   Alcohol use: No    Alcohol/week: 0.0 standard drinks of alcohol   Drug use: No   Sexual activity: Never  Other Topics Concern   Not on file  Social History Narrative   Patient lives at home with husband Westly Pam, her son and his wife.    Patient has 2 children.    Patient has a high school education.    Patient is currently unemployed.    Patient is right handed.    Patient drinks 2 cups of caffeine per day.   Social Determinants of Health   Financial Resource Strain: High Risk (09/13/2021)   Overall Financial Resource Strain (CARDIA)    Difficulty of Paying Living Expenses: Very hard  Food Insecurity: No Food Insecurity (02/04/2023)   Hunger Vital Sign    Worried About Running Out of Food in the Last Year: Never true    Ran Out of Food in the Last Year: Never true  Transportation Needs: No Transportation Needs (02/04/2023)   PRAPARE - Administrator, Civil Service (Medical): No    Lack of Transportation (Non-Medical): No  Physical Activity:  Inactive (08/13/2021)   Exercise Vital Sign    Days of Exercise per Week: 0 days    Minutes of Exercise per Session: 0 min  Stress: No Stress Concern Present (01/08/2021)   Received from [facility], [facility] - Occupational Stress Questionnaire    Feeling of Stress : Not at all  Social Connections: Moderately Integrated (08/13/2021)   Social Connection and Isolation Panel [NHANES]    Frequency of Communication with Friends and Family: More than three times a week    Frequency of Social Gatherings with Friends and Family: More than three times a week    Attends Religious Services: Never    Database administrator or Organizations: Yes  Attends Banker Meetings: Never    Marital Status: Married  Catering manager Violence: Not At Risk (02/04/2023)   Humiliation, Afraid, Rape, and Kick questionnaire    Fear of Current or Ex-Partner: No    Emotionally Abused: No    Physically Abused: No    Sexually Abused: No    SUBJECTIVE  Review of Systems Constitutional: Patient denies any unintentional weight loss or change in strength lntegumentary: Patient denies any rashes or pruritus Cardiovascular: Patient denies chest pain or syncope Respiratory: Patient denies shortness of breath Gastrointestinal: Patient denies nausea, vomiting, constipation, or diarrhea Musculoskeletal: Patient denies muscle cramps or weakness Neurologic: Patient denies convulsions or seizures Psychiatric: Patient denies memory problems Allergic/Immunologic: Patient denies recent allergic reaction(s) Hematologic/Lymphatic: Patient denies bleeding tendencies Endocrine: Patient denies heat/cold intolerance  GU: As per HPI.  OBJECTIVE There were no vitals filed for this visit. There is no height or weight on file to calculate BMI.  Physical Examination Constitutional: No obvious distress; patient is non-toxic appearing  Cardiovascular: No visible lower  extremity edema.  Respiratory: The patient does not have audible wheezing/stridor; respirations do not appear labored  Gastrointestinal: Abdomen non-distended Musculoskeletal: Normal ROM of UEs  Skin: No obvious rashes/open sores  Neurologic: CN 2-12 grossly intact Psychiatric: Answered questions appropriately with normal affect  Hematologic/Lymphatic/Immunologic: No obvious bruises or sites of spontaneous bleeding  UA: no evidence of UTI or microscopic hematuria  ASSESSMENT Kidney stones - Plan: DG Abd 1 View  Recurrent UTI  We reviewed recent imaging results; no acute findings. We discussed possibility that her stones may have been an infectious nidus; hopefully she won't have recurrent UTIs now that she is stone-free.   Patient was advised that if/when UTI-like symptoms occur they can call our office to speak with a nurse, who can place an order for urinalysis (with reflex to urine culture). They may then proceed to a [facility] to provide their urine sample. This is required for evaluation in order for their Urology provider to make informed treatment recommendation(s), which may or may not include an antibiotic prescription.  For stone prevention: Advised adequate hydration and we discussed option to consider low oxalate diet given that calcium oxalate is the most common type of stone. Handout provided about stone prevention diet.  Will plan to follow up in 6 months with KUB for stone surveillance or sooner if needed. Pt verbalized understanding and agreement. All questions were answered.  PLAN Advised the following: Maintain adequate fluid intake daily. Drink citrus juice (lemon, lime or orange juice) routinely. Low oxalate diet. Return in about 6 months (around 11/02/2023) for KUB, UA, & f/u with Evette Georges NP.  Orders Placed This Encounter  Procedures   DG Abd 1 View    Standing Status:   Future    Standing Expiration Date:   05/04/2024    Order Specific  Question:   Reason for Exam (SYMPTOM  OR DIAGNOSIS REQUIRED)    Answer:   kidney stone    Order Specific Question:   Preferred imaging location?    Answer:   [facility]    It has been explained that the patient is to follow regularly with their PCP in addition to all other providers involved in their care and to follow instructions provided by these respective offices. Patient advised to contact urology clinic if any urologic-pertaining questions, concerns, new symptoms or problems arise in the interim period.  Patient Instructions  >80% of stones are calcium oxalate. This type of  stones forms when body either isn't clearing oxalate well enough, is making too much oxalate, or too little citrate. This results in oxalate binding to form crystals, which continue to aggregate and form stones.  Limiting calcium does not help, but limiting oxalate in the diet can help. Increasing citric acid intake may also help.  The following measures may help to prevent the recurrence of stones: Increase water intake to 2-2.5 liters per day May add citrus juice (lemon, lime or orange juice) to water Moderation in dairy foods Decrease in salt content 5. Low Oxalate diet: Oxylates are found in foods like Tomato, Spinach, red wine and chocolate (see additional resources below).  Internet resources for information regarding low oxalate diet: https://kidneystones.yangchunwu.com https://my.VerticalStretch.be  Foods Low in Sodium or Oxalate Foods You Can Eat  Drinks Coffee, fruit and veggie juice (using the recommended veggies), fruit punch  Fruits Apples, apricots (fresh or canned), avocado, bananas, cherries (sweet), cranberries, grapefruit, red or green grapes, lemon and lime juice, melons, nectarines, papayas, peaches, pears, pineapples, oranges, strawberries (fresh), tangerines  Veggies Artichokes, asparagus,  bamboo shoots, broccoli, brussels sprouts, cabbage, cauliflower, chayote squash, chicory, corn, cucumbers, endive, lettuce, lima beans, mushrooms, onions, peas, peppers, potatoes, radishes, rutabagas, zucchini  Breads, Cereals, Grains Egg noodles, rye bread, cooked and dry cereals without nuts or bran, crackers with unsalted tops, white or wild rice  Meat, Meat Replacements, Fish, Recruitment consultant, fish, poultry, eggs, egg whites, egg replacements  Soup Homemade soup (using the recommended veggies and meat), low-sodium bouillon, low-sodium canned  Desserts Cookies, cakes, ice cream, pudding without chocolate or nuts, candy without chocolate or nuts  Fats and Oils Butter, margarine, cream, oil, salad dressing, mayo  Other Foods Unsalted potato chips or pretzels, herbs (like garlic, garlic powder, onion powder), lemon juice, salt-free seasoning blends, vinegar  Other Foods Low in Oxalate Foods You Can Eat  Drinks Beer, cola, wine, buttermilk, lemonade or limeade (without added vitamin C), milk  Meat, Meat Replacements, Fish, [employer] meat, ham, bacon, hot dogs, bratwurst, sausage, chicken nuggets, cheddar cheese, canned fish and shellfish  Soup Tomato soup, cheese soup  Other Foods Coconuts, lemon or lime juices, sugar or sweeteners, jellies or jams (from the recommended list)   Moderate-Oxalate Foods Foods to Limit   Drinks Fruit and veggie juices (from the list below), chocolate milk, rice milk, hot cocoa, tea   Fruits Blackberries, blueberries, black currants, cherries (sour), fruit cocktail, mangoes, orange peel, prunes, purple plums   Veggies Baked beans, carrots, celery, green beans, parsnips, summer squash, tomatoes, turnips   Breads, Cereals, Grains White bread, cornbread or cornmeal, white English muffins, saltine or soda crackers, brown rice, vanilla wafers, spaghetti and other noodles, firm tofu, bagels, oatmeal   Meat/meat replacements, fish, poultry Sardines   Desserts Chocolate  cake   Fats and Oils Macadamia nuts, pistachio nuts, English walnuts   Other Foods Jams or jellies (made with the fruits above), pepper    High-Oxalate Foods Foods to Avoid Drinks Chocolate drink mixes, soy milk, Ovaltine, instant iced tea, fruit juices of fruits listed below Fruits Apricots (dried), red currants, figs, kiwi, plums, rhubarb Veggies Beans (wax, dried), beets and beet greens, chives, collard greens, eggplant, escarole, dark greens of all kinds, leeks, okra, parsley, rutabagas, spinach, Swiss chard, tomato paste, watercress Breads, Cereals, Grains Amaranth, barley, white corn flour, fried potatoes, fruitcake, grits, soybean products, sweet potatoes, wheat germ and bran, buckwheat flour, All Bran cereal, graham crackers, pretzels, whole wheat bread Meat/meat replacements, fish, poultry  Dried beans,  peanut butter, soy burgers, miso  Desserts Carob, chocolate, marmalades Fats and Oils Nuts (peanuts, almonds, pecans, cashews, hazelnuts), nut butters, sesame seeds, tahini paste Other Foods Poppy seeds         Recommendations regarding UTI prevention / management:  When UTI symptoms occur: Call urology office to request order for urine culture. We recommend waiting for urine culture result prior to use of any antibiotics.  For bladder pain/ burning with urination: Over the counter Pyridium (phenazopyridine) as needed (commonly known under the "AZO" brand). No more than 3 days consecutively at a time due to risk for methemoglobinemia, liver function issues, and bone health damage with long term use of Pyridium.  Routine use for UTI prevention: Adequate fluid intake (>1.5 liters/day) to flush out the urinary tract. - Go to the bathroom to urinate every 4-6 hours while awake to minimize urinary stasis / bacterial overgrowth in the bladder. - Proanthocyanidin (PAC) supplement 36 mg daily; must be soluble (insoluble form of PAC will be ineffective). Recommended brand:  Ellura. This is an over-the-counter supplement (often must be found/ purchased online) supplement derived from cranberries with concentrated active component: Proanthocyanidin (PAC) 36 mg daily. Decreases bacterial adherence to bladder lining. Not recommended for patients with interstitial cystitis due to acidity. - D-mannose powder (2 grams daily). This is an over-the-counter supplement which decreases bacterial adherence to bladder lining (it is a sugar that inhibits bacterial adherence to urothelial cells by binding to the pili of enteric bacteria). Take as per manufacturer recommendation. Can be used as an alternative or in addition to the concentrated cranberry supplement. Not recommended for diabetic patients due to sugar content. - Vitamin C supplement to acidify urine to minimize bacterial growth. Not recommended for patients with interstitial cystitis due to acidity. - Probiotic to maintain healthy vaginal microbiome to suppress bacteria at urethral opening. Brand recommendations: Darrold Junker (includes probiotic & D-mannose ), Feminine Balance (highest concentration of lactobacillus) or Hyperbiotic Pro 15.  Note for patients with diabetes: You may read about D-mannose powder for UTI prevention. That is an over the-counter supplement which decreases bacterial adherence to bladder lining. I would NOT advise that for you as a person with diabetes due to its sugar content.   Electronically signed by:  Donnita Falls, MSN, FNP-C, CUNP 05/05/2023 3:18 PM

## 2023-05-01 ENCOUNTER — Telehealth: Payer: Self-pay | Admitting: *Deleted

## 2023-05-01 ENCOUNTER — Telehealth: Payer: Self-pay

## 2023-05-01 NOTE — Telephone Encounter (Signed)
...  Pre-operative Risk Assessment    Patient Name: [name]  DOB: 09-Oct-1955 MRN: [id number]      Request for Surgical Clearance    Procedure:   LEFT TOTAL KNEE REPLACEMENT  Date of Surgery:  Clearance TBD                                 Surgeon:  DR Weber Cooks Surgeon's Group or Practice Name:  [facility] Phone number:  [phone] Fax number:  [phone]   Type of Clearance Requested:   - Medical  - Pharmacy:  [facility] Rivaroxaban (Xarelto)     Type of Anesthesia:  Spinal   Additional requests/questions:   LAST O/V 12/23/22, NO FOLLOW O/V  Signed, Renee Ramus   05/01/2023, 3:02 PM

## 2023-05-01 NOTE — Telephone Encounter (Signed)
Fax received from Dr. Quinn Plowman with [facility] ([phone]) to perform a left total knee replacement on patient.  Patient needs surgery clearance. Surgery is pending. Patient was seen on 02/16/23 by Dr. Craige Cotta and has appt with Dr Sherene Sires for 06/18/23. Office protocol is a risk assessment can be sent to surgeon if patient has been seen in 60 days or less.   Sending to Dr Sherene Sires for risk assessment or recommendations if patient needs to be seen in office prior to surgical procedure.   Please let me know when risk assessment complete after next visit, thanks!

## 2023-05-04 ENCOUNTER — Telehealth: Payer: Self-pay

## 2023-05-04 ENCOUNTER — Telehealth: Payer: Self-pay | Admitting: Cardiovascular Disease

## 2023-05-04 NOTE — Telephone Encounter (Signed)
1st attempt at moving up pts appt. [facility]

## 2023-05-04 NOTE — Telephone Encounter (Signed)
Will need office eval as can't dr Silverio Lay pt without ov 1st

## 2023-05-04 NOTE — Telephone Encounter (Signed)
Follow Up:    Patient is retuning Karen's call from today.

## 2023-05-04 NOTE — Telephone Encounter (Signed)
Name: [name]  DOB: 31-Oct-1955  MRN: [id number]  Primary Cardiologist: Nanetta Batty, MD  Chart reviewed as part of pre-operative protocol coverage. Because of [name]'s past medical history and time since last visit, she will require a follow-up in-office visit in order to better assess preoperative cardiovascular risk.  She saw Tereso Newcomer PA-C in 12/2022 for purposes of urologic pre-op evaluation, made challenging by chronic SOB and sedentary lifestyle. Had stress test at that time. 6 month follow-up was recommended. Per chart review, she has since been in touch with pulmonology earlier this month about worsening SOB. Therefore I do not think virtual visit would be helpful to clear her for this new additional surgery. Recommend in-person visit. Has appt in 06/2023 with Dr. Allyson Sabal; let's see if we can move this up with either MD or APP.  Pre-op covering staff: - Please schedule appointment and call patient to inform them. If patient already had an upcoming appointment within acceptable timeframe, please add "pre-op clearance" to the appointment notes so provider is aware. - Please contact requesting surgeon's office via preferred method (i.e, phone, fax) to inform them of need for appointment prior to surgery.  Regarding her anticoagulation, this is not managed by our office. 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 Her anticoagulation is managed by primary care and she has seen vascular surgery for evaluation in the past. Defer recommendations regarding anticoagulation around the time of surgery to primary care +/- vascular surgery. "  Laurann Montana, PA-C  05/04/2023, 9:59 AM

## 2023-05-05 ENCOUNTER — Encounter: Payer: Self-pay | Admitting: Urology

## 2023-05-05 ENCOUNTER — Ambulatory Visit (INDEPENDENT_AMBULATORY_CARE_PROVIDER_SITE_OTHER): Payer: 59 | Admitting: Urology

## 2023-05-05 DIAGNOSIS — Z09 Encounter for follow-up examination after completed treatment for conditions other than malignant neoplasm: Secondary | ICD-10-CM

## 2023-05-05 DIAGNOSIS — Z87440 Personal history of urinary (tract) infections: Secondary | ICD-10-CM

## 2023-05-05 DIAGNOSIS — N390 Urinary tract infection, site not specified: Secondary | ICD-10-CM

## 2023-05-05 DIAGNOSIS — Z87442 Personal history of urinary calculi: Secondary | ICD-10-CM | POA: Diagnosis not present

## 2023-05-05 DIAGNOSIS — N200 Calculus of kidney: Secondary | ICD-10-CM

## 2023-05-05 NOTE — Telephone Encounter (Signed)
Follow Up:      Patient is calling back again , had returned call back yesterday.

## 2023-05-05 NOTE — Patient Instructions (Addendum)
>80% of stones are calcium oxalate. This type of stones forms when body either isn't clearing oxalate well enough, is making too much oxalate, or too little citrate. This results in oxalate binding to form crystals, which continue to aggregate and form stones.  Limiting calcium does not help, but limiting oxalate in the diet can help. Increasing citric acid intake may also help.  The following measures may help to prevent the recurrence of stones: Increase water intake to 2-2.5 liters per day May add citrus juice (lemon, lime or orange juice) to water Moderation in dairy foods Decrease in salt content 5. Low Oxalate diet: Oxylates are found in foods like Tomato, Spinach, red wine and chocolate (see additional resources below).  Internet resources for information regarding low oxalate diet: [email or web address].[email or web address] https://my.VerticalStretch.be  Foods Low in Sodium or Oxalate Foods You Can Eat  Drinks Coffee, fruit and veggie juice (using the recommended veggies), fruit punch  Fruits Apples, apricots (fresh or canned), avocado, bananas, cherries (sweet), cranberries, grapefruit, red or green grapes, lemon and lime juice, melons, nectarines, papayas, peaches, pears, pineapples, oranges, strawberries (fresh), tangerines  Veggies Artichokes, asparagus, bamboo shoots, broccoli, brussels sprouts, cabbage, cauliflower, chayote squash, chicory, corn, cucumbers, endive, lettuce, lima beans, mushrooms, onions, peas, peppers, potatoes, radishes, rutabagas, zucchini  Breads, Cereals, Grains Egg noodles, rye bread, cooked and dry cereals without nuts or bran, crackers with unsalted tops, white or wild rice  Meat, Meat Replacements, Fish, Recruitment consultant, fish, poultry, eggs, egg whites, egg replacements  Soup Homemade soup (using the recommended veggies and meat), low-sodium bouillon, low-sodium canned   Desserts Cookies, cakes, ice cream, pudding without chocolate or nuts, candy without chocolate or nuts  Fats and Oils Butter, margarine, cream, oil, salad dressing, mayo  Other Foods Unsalted potato chips or pretzels, herbs (like garlic, garlic powder, onion powder), lemon juice, salt-free seasoning blends, vinegar  Other Foods Low in Oxalate Foods You Can Eat  Drinks Beer, cola, wine, buttermilk, lemonade or limeade (without added vitamin C), milk  Meat, Meat Replacements, Fish, [employer] meat, ham, bacon, hot dogs, bratwurst, sausage, chicken nuggets, cheddar cheese, canned fish and shellfish  Soup Tomato soup, cheese soup  Other Foods Coconuts, lemon or lime juices, sugar or sweeteners, jellies or jams (from the recommended list)   Moderate-Oxalate Foods Foods to Limit   Drinks Fruit and veggie juices (from the list below), chocolate milk, rice milk, hot cocoa, tea   Fruits Blackberries, blueberries, black currants, cherries (sour), fruit cocktail, mangoes, orange peel, prunes, purple plums   Veggies Baked beans, carrots, celery, green beans, parsnips, summer squash, tomatoes, turnips   Breads, Cereals, Grains White bread, cornbread or cornmeal, white English muffins, saltine or soda crackers, brown rice, vanilla wafers, spaghetti and other noodles, firm tofu, bagels, oatmeal   Meat/meat replacements, fish, poultry Sardines   Desserts Chocolate cake   Fats and Oils Macadamia nuts, pistachio nuts, English walnuts   Other Foods Jams or jellies (made with the fruits above), pepper    High-Oxalate Foods Foods to Avoid Drinks Chocolate drink mixes, soy milk, Ovaltine, instant iced tea, fruit juices of fruits listed below Fruits Apricots (dried), red currants, figs, kiwi, plums, rhubarb Veggies Beans (wax, dried), beets and beet greens, chives, collard greens, eggplant, escarole, dark greens of all kinds, leeks, okra, parsley, rutabagas, spinach, Swiss chard, tomato paste,  watercress Breads, Cereals, Grains Amaranth, barley, white corn flour, fried potatoes, fruitcake, grits, soybean products, sweet potatoes, wheat germ and bran, buckwheat flour, All Bran cereal, graham crackers, pretzels, whole  wheat bread Meat/meat replacements, fish, poultry  Dried beans, peanut butter, soy burgers, miso  Desserts Carob, chocolate, marmalades Fats and Oils Nuts (peanuts, almonds, pecans, cashews, hazelnuts), nut butters, sesame seeds, tahini paste Other Foods Poppy seeds         Recommendations regarding UTI prevention / management:  When UTI symptoms occur: Call urology office to request order for urine culture. We recommend waiting for urine culture result prior to use of any antibiotics.  For bladder pain/ burning with urination: Over the counter Pyridium (phenazopyridine) as needed (commonly known under the "AZO" brand). No more than 3 days consecutively at a time due to risk for methemoglobinemia, liver function issues, and bone health damage with long term use of Pyridium.  Routine use for UTI prevention: Adequate fluid intake (>1.5 liters/day) to flush out the urinary tract. - Go to the bathroom to urinate every 4-6 hours while awake to minimize urinary stasis / bacterial overgrowth in the bladder. - Proanthocyanidin (PAC) supplement 36 mg daily; must be soluble (insoluble form of PAC will be ineffective). Recommended brand: Ellura. This is an over-the-counter supplement (often must be found/ purchased online) supplement derived from cranberries with concentrated active component: Proanthocyanidin (PAC) 36 mg daily. Decreases bacterial adherence to bladder lining. Not recommended for patients with interstitial cystitis due to acidity. - D-mannose powder (2 grams daily). This is an over-the-counter supplement which decreases bacterial adherence to bladder lining (it is a sugar that inhibits bacterial adherence to urothelial cells by binding to the pili of  enteric bacteria). Take as per manufacturer recommendation. Can be used as an alternative or in addition to the concentrated cranberry supplement. Not recommended for diabetic patients due to sugar content. - Vitamin C supplement to acidify urine to minimize bacterial growth. Not recommended for patients with interstitial cystitis due to acidity. - Probiotic to maintain healthy vaginal microbiome to suppress bacteria at urethral opening. Brand recommendations: Darrold Junker (includes probiotic & D-mannose ), Feminine Balance (highest concentration of lactobacillus) or Hyperbiotic Pro 15.  Note for patients with diabetes: You may read about D-mannose powder for UTI prevention. That is an over the-counter supplement which decreases bacterial adherence to bladder lining. I would NOT advise that for you as a person with diabetes due to its sugar content.

## 2023-05-05 NOTE — Telephone Encounter (Signed)
Patient scheduled to see Bernadene Person, NP on 05/15/23 for preop clearance.

## 2023-05-06 LAB — TSH: TSH: 1.3 (ref 0.41–5.90)

## 2023-05-15 ENCOUNTER — Ambulatory Visit: Payer: 59 | Admitting: Nurse Practitioner

## 2023-05-15 NOTE — Progress Notes (Deleted)
Office Visit    Patient Name: [name] Date of Encounter: 05/15/2023  Primary Care Provider:  Shelby Dubin, FNP Primary Cardiologist:  Nanetta Batty, MD  Chief Complaint    67 year old female with a history of chronic diastolic heart failure, DVT, hypertension, hyperlipidemia, CKD stage III, and COPD who presents for follow-up related to heart failure and for preoperative cardiac evaluation.  Past Medical History    Past Medical History:  Diagnosis Date   Allergic rhinitis    Anxiety    Arthritis    "hands and legs and feet" (04/27/2012)   Asthma    Cancer (HCC) 2005   rt arm mole rem cancer   CHF (congestive heart failure) (HCC)    Chronic bronchitis (HCC)    "keep it all the time" (04/27/2012)   Chronic kidney disease    Chronic kidney disease (CKD)    Stage III   COPD (chronic obstructive pulmonary disease) (HCC)    COVID-19 virus infection    03-2021   Depression    PT DENIES   Diverticulosis    DJD (degenerative joint disease)    Emphysema    Epilepsy (HCC)    Epilepsy (HCC)    Esophageal stricture    Exertional dyspnea    GERD (gastroesophageal reflux disease)    Glaucoma    H/O blood clots    Right leg   Heart failure (HCC)    History of COVID-19 03/2021   History of kidney stones    History of nuclear stress test    Myoview 12/25/22: EF 74, no ischemia or infarction, low risk   Hyperlipidemia    Hypertension    IBS (irritable bowel syndrome)    Migraine    Neck pain    Cervical disc   OSA on CPAP    6-7 yrs; pt wears CPAP but has not been able to use it lately due to sinus issues   Oxygen deficiency    USES 3 LITERS 02 CONTINOUS   Pneumonia 2012; 04/27/2012   Recurrent upper respiratory infection (URI)    Right leg DVT (HCC) 1982   groin   Seizures (HCC)    LAST SEIZURE 6 MONTHS OR  LONGER   Sleep apnea    Type II diabetes mellitus (HCC)    No meds since weight loss   UTI (urinary tract infection)    2 weeks ago   Vertigo 10/24/2014    Past Surgical History:  Procedure Laterality Date   ABDOMINAL HYSTERECTOMY  2001   "partial"   BIOPSY  04/16/2021   Procedure: BIOPSY;  Surgeon: Napoleon Form, MD;  Location: [facility];  Service: Endoscopy;;   CARPAL TUNNEL RELEASE  ~ 2008   right   CARPAL TUNNEL RELEASE Right    CATARACT EXTRACTION Bilateral    COLONOSCOPY     COLONOSCOPY WITH ESOPHAGOGASTRODUODENOSCOPY (EGD)     CYSTOSCOPY/URETEROSCOPY/HOLMIUM LASER/STENT PLACEMENT Left 01/27/2023   Procedure: CYSTOSCOPY LEFT RETROGRADE PYELOGRAM, LEFT URETEROSCOPY, HOLMIUM LASER LITHOTRIPSY, AND LEFT URETERAL STENT PLACEMENT;  Surgeon: Bjorn Pippin, MD;  Location: [facility];  Service: Urology;  Laterality: Left;  60 MINUTES   ESOPHAGOGASTRODUODENOSCOPY (EGD) WITH PROPOFOL N/A 04/16/2021   Procedure: ESOPHAGOGASTRODUODENOSCOPY (EGD) WITH PROPOFOL;  Surgeon: Napoleon Form, MD;  Location: [facility];  Service: Endoscopy;  Laterality: N/A;   KNEE ARTHROSCOPY  1990's   left   NASAL SEPTOPLASTY W/ TURBINOPLASTY Bilateral 05/23/2016   Procedure: NASAL SEPTOPLASTY WITH TURBINATE REDUCTION;  Surgeon: Osborn Coho, MD;  Location: [facility]  OR;  Service: ENT;  Laterality: Bilateral;   SHOULDER OPEN ROTATOR CUFF REPAIR  ~ 2010   left   SINOSCOPY     SINUS ENDO WITH FUSION Right 05/23/2016   Procedure: LIMITED RIGHT ENDOSCOPIC SINUS SURGERY;  Surgeon: Osborn Coho, MD;  Location: [facility] OR;  Service: ENT;  Laterality: Right;   TOTAL KNEE ARTHROPLASTY  08/23/2012   right   TOTAL KNEE ARTHROPLASTY  08/23/2012   Procedure: TOTAL KNEE ARTHROPLASTY;  Surgeon: Nilda Simmer, MD;  Location: [facility] OR;  Service: Orthopedics;  Laterality: Right;  RIGHT KNEE ARTHROPLASTY MEDIAL AND LATERAL COMPARTMENTS WITH PATELLA RESURFACING   TUBAL LIGATION  2001   UPPER GASTROINTESTINAL ENDOSCOPY      Allergies  Allergies  Allergen Reactions   Amoxicillin Anaphylaxis    Breakout, mouth swells   Azithromycin Swelling and Rash    "swelling all over,  hives, thrush"   Cefdinir     Throat swelling after 3rd dose   Chantix [Varenicline] Other (See Comments)    "bad dreams, difficulty breathing"   Doxycycline Hyclate Anaphylaxis and Rash    swelling   Levofloxacin Anaphylaxis    Pt can take moxifloxacin   Penicillins Anaphylaxis, Shortness Of Breath and Rash    "ALMOST DIED, ended up in hospital"    Sulfonamide Derivatives Shortness Of Breath, Swelling and Rash    "break out from head to toe"   Toradol [Ketorolac Tromethamine] Other (See Comments)    Seizure    Clarithromycin Hives    swelling   Nortriptyline Swelling, Rash and Other (See Comments)    Per patient had "kidney problems" on this medication, could not use bathroom (urinary retention)   Paxlovid [Nirmatrelvir-Ritonavir] Swelling    Throat swelling   Codeine Nausea And Vomiting   Fluticasone-Salmeterol Other (See Comments)    REACTION: ulcers in mouth   Triptans Rash    Mouth breaks out and start itching     Labs/Other Studies Reviewed    The following studies were reviewed today:  Cardiac Studies & Procedures     STRESS TESTS  MYOCARDIAL PERFUSION IMAGING 12/25/2022  Narrative   The study is normal. The study is low risk.   No ST deviation was noted.   LV perfusion is normal. There is no evidence of ischemia. There is no evidence of infarction.   Left ventricular function is normal. Nuclear stress EF: 74 %. The left ventricular ejection fraction is hyperdynamic (>65%). End diastolic cavity size is normal. End systolic cavity size is normal.   Prior study not available for comparison.  Normal resting and stress perfusion. No ischemia or infarction EF 74%   ECHOCARDIOGRAM  ECHOCARDIOGRAM COMPLETE 09/03/2021  Narrative ECHOCARDIOGRAM REPORT    Patient Name:   [name]   Date of Exam: 09/03/2021 Medical Rec #:  [id number]     Height:       61.0 in Accession #:    [id number]    Weight:       171.0 lb Date of Birth:  Aug 05, 1956     BSA:          1.767  m Patient Age:    65 years      BP:           100/54 mmHg Patient Gender: F             HR:           86 bpm. Exam Location:  [address]  Procedure: 2D Echo, Cardiac Doppler, Color  Doppler and Strain Analysis  Indications:    I50.33 CHF  History:        Patient has prior history of Echocardiogram examinations, most recent 07/15/2016. COPD; Risk Factors:Sleep Apnea, Dyslipidemia, Current Smoker and Obesity.  Sonographer:    Jorje Guild BS, RDCS Referring Phys: [phone] JONATHAN J BERRY  IMPRESSIONS   1. Left ventricular ejection fraction, by estimation, is 60 to 65%. The left ventricle has normal function. The left ventricle has no regional wall motion abnormalities. Left ventricular diastolic parameters are consistent with Grade I diastolic dysfunction (impaired relaxation). The average left ventricular global longitudinal strain is -26.9 %. The global longitudinal strain is normal. 2. Right ventricular systolic function is normal. The right ventricular size is normal. 3. The mitral valve is normal in structure. No evidence of mitral valve regurgitation. No evidence of mitral stenosis. 4. The aortic valve is normal in structure. Aortic valve regurgitation is not visualized. No aortic stenosis is present. 5. The inferior vena cava is normal in size with greater than 50% respiratory variability, suggesting right atrial pressure of 3 mmHg.  Comparison(s): 07/15/16 EF 55-60%. PA pressure .  FINDINGS Left Ventricle: Left ventricular ejection fraction, by estimation, is 60 to 65%. The left ventricle has normal function. The left ventricle has no regional wall motion abnormalities. The average left ventricular global longitudinal strain is -26.9 %. The global longitudinal strain is normal. The left ventricular internal cavity size was normal in size. There is no left ventricular hypertrophy. Left ventricular diastolic parameters are consistent with Grade I diastolic dysfunction  (impaired relaxation).  Right Ventricle: The right ventricular size is normal. No increase in right ventricular wall thickness. Right ventricular systolic function is normal.  Left Atrium: Left atrial size was normal in size.  Right Atrium: Right atrial size was normal in size.  Pericardium: There is no evidence of pericardial effusion.  Mitral Valve: The mitral valve is normal in structure. No evidence of mitral valve regurgitation. No evidence of mitral valve stenosis.  Tricuspid Valve: The tricuspid valve is normal in structure. Tricuspid valve regurgitation is not demonstrated. No evidence of tricuspid stenosis.  Aortic Valve: The aortic valve is normal in structure. Aortic valve regurgitation is not visualized. No aortic stenosis is present.  Pulmonic Valve: The pulmonic valve was normal in structure. Pulmonic valve regurgitation is trivial. No evidence of pulmonic stenosis.  Aorta: The aortic root is normal in size and structure.  Venous: The inferior vena cava is normal in size with greater than 50% respiratory variability, suggesting right atrial pressure of 3 mmHg.  IAS/Shunts: No atrial level shunt detected by color flow Doppler.   LEFT VENTRICLE PLAX 2D LVIDd:         4.90 cm   Diastology LVIDs:         3.20 cm   LV e' medial:    12.00 cm/s LV PW:         1.00 cm   LV E/e' medial:  8.9 LV IVS:        0.80 cm   LV e' lateral:   12.70 cm/s LVOT diam:     2.10 cm   LV E/e' lateral: 8.4 LV SV:         90 LV SV Index:   51        2D Longitudinal Strain LVOT Area:     3.46 cm  2D Strain GLS (A2C):   -27.0 % 2D Strain GLS (A3C):   -26.5 % 2D Strain GLS (A4C):   -  27.1 % 2D Strain GLS Avg:     -26.9 %  RIGHT VENTRICLE             IVC RV Basal diam:  3.00 cm     IVC diam: 1.70 cm RV S prime:     14.60 cm/s TAPSE (M-mode): 2.1 cm  LEFT ATRIUM             Index        RIGHT ATRIUM           Index LA diam:        3.90 cm 2.21 cm/m   RA Pressure: 3.00 mmHg LA Vol (A2C):    36.9 ml 20.88 ml/m  RA Area:     10.50 cm LA Vol (A4C):   26.4 ml 14.94 ml/m  RA Volume:   20.40 ml  11.54 ml/m LA Biplane Vol: 31.5 ml 17.83 ml/m AORTIC VALVE LVOT Vmax:   139.00 cm/s LVOT Vmean:  93.200 cm/s LVOT VTI:    0.261 m  AORTA Ao Root diam: 2.90 cm Ao Asc diam:  3.20 cm  MITRAL VALVE                TRICUSPID VALVE Estimated RAP:  3.00 mmHg MV Decel Time: 204 msec MV E velocity: 107.00 cm/s  SHUNTS MV A velocity: 111.00 cm/s  Systemic VTI:  0.26 m MV E/A ratio:  0.96         Systemic Diam: 2.10 cm  Donato Schultz MD Electronically signed by Donato Schultz MD Signature Date/Time: 09/03/2021/1:37:03 PM    Final            Recent Labs: 02/05/2023: ALT 10 02/06/2023: BUN 21; Creatinine, Ser 1.11; Hemoglobin 9.4; Magnesium 2.2; Platelets 250; Potassium 4.0; Sodium 136  Recent Lipid Panel    Component Value Date/Time   CHOL 184 08/21/2021 1120   TRIG 106 08/21/2021 1120   HDL 91 08/21/2021 1120   CHOLHDL 2.0 08/21/2021 1120   CHOLHDL 2.8 06/19/2014 1656   VLDL 35 06/19/2014 1656   LDLCALC 75 08/21/2021 1120   LDLDIRECT 77 09/14/2012 1618    History of Present Illness    67 year old female with the above past medical history including chronic diastolic heart failure, DVT, hypertension, hyperlipidemia, CKD stage III, and COPD.  She has a history of chronic dyspnea in the setting of COPD, follows with pulmonology.  She has a history of chronic diastolic heart failure.  Echocardiogram in 08/2021 showed EF 60 to 65%, normal LV function, no RWMA, G1 DD, normal RV, no significant valvular abnormalities.  She has not required diuretic therapy recently.  She was last seen in the office on 12/23/2022 and was stable from a cardiac standpoint.  She did note ongoing dyspnea with exertion.  Lexiscan Myoview in the setting of preoperative cardiac evaluation was negative for ischemia.  She was cleared for ureteroscopy and stent placement at the time.  She presents today for follow-up  and for preoperative cardiac evaluation for left total knee replacement with Dr. Weber Cooks of [facility] with request to hold Xarelto prior to surgery.  Regarding her anticoagulation, this is not managed by our office. 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 Her anticoagulation is managed by primary care and she has seen vascular surgery for evaluation in the past. Defer recommendations regarding anticoagulation around the time of surgery to primary care +/- vascular surgery. "   Chronic diastolic heart failure:  Chronic dyspnea on exertion/COPD: History of DVT: Hypertension: Hyperlipidemia: CKD stage III: Preoperative cardiac exam: Disposition:  Home Medications    Current Outpatient Medications  Medication Sig Dispense Refill   albuterol (PROAIR HFA) 108 (90 Base) MCG/ACT inhaler INHALE TWO PUFFS INTO THE LUNGS EVERY 6 HOURS AS NEEDED FOR SHORTNESS OF BREATH (Patient taking differently: Inhale 2 puffs into the lungs every 6 (six) hours as needed for shortness of breath or wheezing.) 18 g 6   albuterol (PROVENTIL) (2.5 MG/3ML) 0.083% nebulizer solution USE 1 VIAL IN NEBULIZER EVERY 6 HOURS AS NEEDED FOR SHORTNESS OF BREATH (Patient taking differently: Take 2.5 mg by nebulization every 6 (six) hours as needed for wheezing or shortness of breath.) 75 mL 8   allopurinol (ZYLOPRIM) 100 MG tablet Take 100 mg by mouth daily.     ALPRAZolam (XANAX) 0.25 MG tablet Take 0.25 mg by mouth 2 (two) times daily.     Azelastine HCl (ASTEPRO) 0.15 % SOLN Place 1 spray into the nose 2 (two) times daily as needed (Allergies). 30 mL 5   Cholecalciferol (VITAMIN D3) 50 MCG (2000 UT) capsule TAKE ONE CAPSULE BY MOUTH DAILY (Patient taking differently: Take 2,000 Units by mouth daily.) 90 capsule 1   famotidine (PEPCID) 40 MG tablet Take 40 mg by mouth at bedtime.     ferrous sulfate 324 (65 Fe) MG TBEC TAKE ONE TABLET BY MOUTH EVERY OTHER DAY (Patient  taking differently: Take 324 mg by mouth every other day.) 90 tablet 5   gabapentin (NEURONTIN) 100 MG capsule Take 1 capsule (100 mg total) by mouth at bedtime. (Patient taking differently: Take 100 mg by mouth 3 (three) times daily.) 90 capsule 3   guaiFENesin (MUCINEX) 600 MG 12 hr tablet Take 2 tablets (1,200 mg total) by mouth 2 (two) times daily as needed. (Patient taking differently: Take 1,200 mg by mouth 2 (two) times daily as needed for cough or to loosen phlegm.) 60 tablet 5   ipratropium-albuterol (DUONEB) 0.5-2.5 (3) MG/3ML SOLN Take 3 mLs by nebulization every 6 (six) hours as needed (Wheezing/sob).     lamoTRIgine (LAMICTAL) 200 MG tablet TAKE 1 AND 1/2 TABLETS BY MOUTH TWICE DAILY (Patient taking differently: Take 300 mg by mouth 2 (two) times daily.) 270 tablet 1   loratadine (ALLERGY RELIEF) 10 MG tablet Take 1 tablet (10 mg total) by mouth daily. 90 tablet 3   montelukast (SINGULAIR) 10 MG tablet TAKE ONE TABLET BY MOUTH AT BEDTIME 30 tablet 5   ondansetron (ZOFRAN) 4 MG tablet Take 1 tablet (4 mg total) by mouth daily as needed for nausea or vomiting. 30 tablet 1   oxyCODONE-acetaminophen (PERCOCET/ROXICET) 5-325 MG tablet Take 1 tablet by mouth every 6 (six) hours as needed for severe pain. 15 tablet 0   OXYGEN Inhale 3 L into the lungs continuous.     pantoprazole (PROTONIX) 40 MG tablet Take 1 tablet (40 mg total) by mouth 2 (two) times daily. **PLEASE CALL OFFICE TO SCHEDULE FOLLOW UP 60 tablet 0   predniSONE (DELTASONE) 10 MG tablet Take 1 tablet (10 mg total) by mouth daily with breakfast. 10 mg take  4 each am x 2 days,   2 each am x 2 days,  1 each am x 2 days and stop 20 tablet 0   QUEtiapine (SEROQUEL) 100 MG tablet TAKE ONE TABLET BY MOUTH AT BEDTIME (Patient taking differently: Take 100 mg by mouth at bedtime.) 30 tablet 5   simvastatin (ZOCOR) 40 MG tablet TAKE ONE  TABLET BY MOUTH AT BEDTIME (Patient taking differently: Take 40 mg by mouth daily at 6 PM.) 90 tablet 2    topiramate (TOPAMAX) 100 MG tablet Take 200 mg twice daily (Patient taking differently: Take 200 mg by mouth 2 (two) times daily.) 360 tablet 2   TRELEGY ELLIPTA 100-62.5-25 MCG/ACT AEPB INHALE 1 PUFF BY MOUTH INTO THE LUNGS DAILY IN THE AFTERNOON (Patient taking differently: Inhale 1 puff into the lungs daily.) 60 each 5   triamcinolone cream (KENALOG) 0.1 % Apply 1 Application topically daily as needed (irritation).     XARELTO 20 MG TABS tablet Take 20 mg by mouth daily.     No current facility-administered medications for this visit.     Review of Systems    ***.  All other systems reviewed and are otherwise negative except as noted above.    Physical Exam    VS:  There were no vitals taken for this visit. , BMI There is no height or weight on file to calculate BMI.     GEN: Well nourished, well developed, in no acute distress. HEENT: normal. Neck: Supple, no JVD, carotid bruits, or masses. Cardiac: RRR, no murmurs, rubs, or gallops. No clubbing, cyanosis, edema.  Radials/DP/PT 2+ and equal bilaterally.  Respiratory:  Respirations regular and unlabored, clear to auscultation bilaterally. GI: Soft, nontender, nondistended, BS + x 4. MS: no deformity or atrophy. Skin: warm and dry, no rash. Neuro:  Strength and sensation are intact. Psych: Normal affect.  Accessory Clinical Findings    ECG personally reviewed by me today -    - no acute changes.   Lab Results  Component Value Date   WBC 9.7 02/06/2023   HGB 9.4 (L) 02/06/2023   HCT 30.7 (L) 02/06/2023   MCV 100.0 02/06/2023   PLT 250 02/06/2023   Lab Results  Component Value Date   CREATININE 1.11 (H) 02/06/2023   BUN 21 02/06/2023   NA 136 02/06/2023   K 4.0 02/06/2023   CL 107 02/06/2023   CO2 22 02/06/2023   Lab Results  Component Value Date   ALT 10 02/05/2023   AST 12 (L) 02/05/2023   ALKPHOS 85 02/05/2023   BILITOT 0.5 02/05/2023   Lab Results  Component Value Date   CHOL 184 08/21/2021   HDL 91  08/21/2021   LDLCALC 75 08/21/2021   LDLDIRECT 77 09/14/2012   TRIG 106 08/21/2021   CHOLHDL 2.0 08/21/2021    Lab Results  Component Value Date   HGBA1C 5.7 (H) 12/24/2022    Assessment & Plan    1.  ***  No BP recorded.  {Refresh Note OR Click here to enter BP  :1}***   Joylene Grapes, NP 05/15/2023, 6:41 AM

## 2023-05-25 ENCOUNTER — Encounter: Payer: Self-pay | Admitting: Nurse Practitioner

## 2023-05-25 ENCOUNTER — Ambulatory Visit: Payer: 59 | Attending: Nurse Practitioner | Admitting: Nurse Practitioner

## 2023-05-25 VITALS — BP 93/64 | HR 91 | Ht 60.0 in | Wt 176.0 lb

## 2023-05-25 DIAGNOSIS — N183 Chronic kidney disease, stage 3 unspecified: Secondary | ICD-10-CM

## 2023-05-25 DIAGNOSIS — I5032 Chronic diastolic (congestive) heart failure: Secondary | ICD-10-CM | POA: Diagnosis not present

## 2023-05-25 DIAGNOSIS — J449 Chronic obstructive pulmonary disease, unspecified: Secondary | ICD-10-CM

## 2023-05-25 DIAGNOSIS — Z86718 Personal history of other venous thrombosis and embolism: Secondary | ICD-10-CM

## 2023-05-25 DIAGNOSIS — E782 Mixed hyperlipidemia: Secondary | ICD-10-CM

## 2023-05-25 DIAGNOSIS — I10 Essential (primary) hypertension: Secondary | ICD-10-CM | POA: Diagnosis not present

## 2023-05-25 DIAGNOSIS — Z01810 Encounter for preprocedural cardiovascular examination: Secondary | ICD-10-CM

## 2023-05-25 NOTE — Patient Instructions (Signed)
Medication Instructions:  Your physician recommends that you continue on your current medications as directed. Please refer to the Current Medication list given to you today.  *If you need a refill on your cardiac medications before your next appointment, please call your pharmacy*   Lab Work: NONE ordered at this time of appointment     Testing/Procedures: NONE ordered at this time of appointment     Follow-Up: At [facility], you and your health needs are our priority.  As part of our continuing mission to provide you with exceptional heart care, we have created designated Provider Care Teams.  These Care Teams include your primary Cardiologist (physician) and Advanced Practice Providers (APPs -  Physician Assistants and Nurse Practitioners) who all work together to provide you with the care you need, when you need it.  We recommend signing up for the patient portal called "MyChart".  Sign up information is provided on this After Visit Summary.  MyChart is used to connect with patients for Virtual Visits (Telemedicine).  Patients are able to view lab/test results, encounter notes, upcoming appointments, etc.  Non-urgent messages can be sent to your provider as well.   To learn more about what you can do with MyChart, go to [email or web address].    Your next appointment:   6 month(s)  Provider:   Nanetta Batty, MD     Other Instructions Please call mid November to schedule 6 month office visit

## 2023-05-25 NOTE — Progress Notes (Signed)
Office Visit    Patient Name: [name] Date of Encounter: 05/25/2023  Primary Care Provider:  Shelby Dubin, FNP Primary Cardiologist:  Nanetta Batty, MD  Chief Complaint    67 year old female with a history of chronic diastolic heart failure, DVT, hypertension, hyperlipidemia, CKD stage III, and COPD who presents for follow-up related to heart failure and for preoperative cardiac evaluation.  Past Medical History    Past Medical History:  Diagnosis Date   Allergic rhinitis    Anxiety    Arthritis    "hands and legs and feet" (04/27/2012)   Asthma    Cancer (HCC) 2005   rt arm mole rem cancer   CHF (congestive heart failure) (HCC)    Chronic bronchitis (HCC)    "keep it all the time" (04/27/2012)   Chronic kidney disease    Chronic kidney disease (CKD)    Stage III   COPD (chronic obstructive pulmonary disease) (HCC)    COVID-19 virus infection    03-2021   Depression    PT DENIES   Diverticulosis    DJD (degenerative joint disease)    Emphysema    Epilepsy (HCC)    Epilepsy (HCC)    Esophageal stricture    Exertional dyspnea    GERD (gastroesophageal reflux disease)    Glaucoma    H/O blood clots    Right leg   Heart failure (HCC)    History of COVID-19 03/2021   History of kidney stones    History of nuclear stress test    Myoview 12/25/22: EF 74, no ischemia or infarction, low risk   Hyperlipidemia    Hypertension    IBS (irritable bowel syndrome)    Migraine    Neck pain    Cervical disc   OSA on CPAP    6-7 yrs; pt wears CPAP but has not been able to use it lately due to sinus issues   Oxygen deficiency    USES 3 LITERS 02 CONTINOUS   Pneumonia 2012; 04/27/2012   Recurrent upper respiratory infection (URI)    Right leg DVT (HCC) 1982   groin   Seizures (HCC)    LAST SEIZURE 6 MONTHS OR  LONGER   Sleep apnea    Type II diabetes mellitus (HCC)    No meds since weight loss   UTI (urinary tract infection)    2 weeks ago   Vertigo 10/24/2014    Past Surgical History:  Procedure Laterality Date   ABDOMINAL HYSTERECTOMY  2001   "partial"   BIOPSY  04/16/2021   Procedure: BIOPSY;  Surgeon: Napoleon Form, MD;  Location: [facility];  Service: Endoscopy;;   CARPAL TUNNEL RELEASE  ~ 2008   right   CARPAL TUNNEL RELEASE Right    CATARACT EXTRACTION Bilateral    COLONOSCOPY     COLONOSCOPY WITH ESOPHAGOGASTRODUODENOSCOPY (EGD)     CYSTOSCOPY/URETEROSCOPY/HOLMIUM LASER/STENT PLACEMENT Left 01/27/2023   Procedure: CYSTOSCOPY LEFT RETROGRADE PYELOGRAM, LEFT URETEROSCOPY, HOLMIUM LASER LITHOTRIPSY, AND LEFT URETERAL STENT PLACEMENT;  Surgeon: Bjorn Pippin, MD;  Location: [facility];  Service: Urology;  Laterality: Left;  60 MINUTES   ESOPHAGOGASTRODUODENOSCOPY (EGD) WITH PROPOFOL N/A 04/16/2021   Procedure: ESOPHAGOGASTRODUODENOSCOPY (EGD) WITH PROPOFOL;  Surgeon: Napoleon Form, MD;  Location: [facility];  Service: Endoscopy;  Laterality: N/A;   KNEE ARTHROSCOPY  1990's   left   NASAL SEPTOPLASTY W/ TURBINOPLASTY Bilateral 05/23/2016   Procedure: NASAL SEPTOPLASTY WITH TURBINATE REDUCTION;  Surgeon: Osborn Coho, MD;  Location: [facility]  OR;  Service: ENT;  Laterality: Bilateral;   SHOULDER OPEN ROTATOR CUFF REPAIR  ~ 2010   left   SINOSCOPY     SINUS ENDO WITH FUSION Right 05/23/2016   Procedure: LIMITED RIGHT ENDOSCOPIC SINUS SURGERY;  Surgeon: Osborn Coho, MD;  Location: [facility] OR;  Service: ENT;  Laterality: Right;   TOTAL KNEE ARTHROPLASTY  08/23/2012   right   TOTAL KNEE ARTHROPLASTY  08/23/2012   Procedure: TOTAL KNEE ARTHROPLASTY;  Surgeon: Nilda Simmer, MD;  Location: [facility] OR;  Service: Orthopedics;  Laterality: Right;  RIGHT KNEE ARTHROPLASTY MEDIAL AND LATERAL COMPARTMENTS WITH PATELLA RESURFACING   TUBAL LIGATION  2001   UPPER GASTROINTESTINAL ENDOSCOPY      Allergies  Allergies  Allergen Reactions   Amoxicillin Anaphylaxis    Breakout, mouth swells   Azithromycin Swelling and Rash    "swelling all over,  hives, thrush"   Cefdinir     Throat swelling after 3rd dose   Chantix [Varenicline] Other (See Comments)    "bad dreams, difficulty breathing"   Doxycycline Hyclate Anaphylaxis and Rash    swelling   Levofloxacin Anaphylaxis    Pt can take moxifloxacin   Penicillins Anaphylaxis, Shortness Of Breath and Rash    "ALMOST DIED, ended up in hospital"    Sulfonamide Derivatives Shortness Of Breath, Swelling and Rash    "break out from head to toe"   Toradol [Ketorolac Tromethamine] Other (See Comments)    Seizure    Clarithromycin Hives    swelling   Nortriptyline Swelling, Rash and Other (See Comments)    Per patient had "kidney problems" on this medication, could not use bathroom (urinary retention)   Paxlovid [Nirmatrelvir-Ritonavir] Swelling    Throat swelling   Codeine Nausea And Vomiting   Fluticasone-Salmeterol Other (See Comments)    REACTION: ulcers in mouth   Triptans Rash    Mouth breaks out and start itching     Labs/Other Studies Reviewed    The following studies were reviewed today:  Cardiac Studies & Procedures     STRESS TESTS  MYOCARDIAL PERFUSION IMAGING 12/25/2022  Narrative   The study is normal. The study is low risk.   No ST deviation was noted.   LV perfusion is normal. There is no evidence of ischemia. There is no evidence of infarction.   Left ventricular function is normal. Nuclear stress EF: 74 %. The left ventricular ejection fraction is hyperdynamic (>65%). End diastolic cavity size is normal. End systolic cavity size is normal.   Prior study not available for comparison.  Normal resting and stress perfusion. No ischemia or infarction EF 74%   ECHOCARDIOGRAM  ECHOCARDIOGRAM COMPLETE 09/03/2021  Narrative ECHOCARDIOGRAM REPORT    Patient Name:   [name]   Date of Exam: 09/03/2021 Medical Rec #:  [id number]     Height:       61.0 in Accession #:    [id number]    Weight:       171.0 lb Date of Birth:  [birth year]     BSA:          1.767  m Patient Age:    65 years      BP:           100/54 mmHg Patient Gender: F             HR:           86 bpm. Exam Location:  [address]  Procedure: 2D Echo, Cardiac Doppler, Color  Doppler and Strain Analysis  Indications:    I50.33 CHF  History:        Patient has prior history of Echocardiogram examinations, most recent 07/15/2016. COPD; Risk Factors:Sleep Apnea, Dyslipidemia, Current Smoker and Obesity.  Sonographer:    Jorje Guild BS, RDCS Referring Phys: [phone] JONATHAN J BERRY  IMPRESSIONS   1. Left ventricular ejection fraction, by estimation, is 60 to 65%. The left ventricle has normal function. The left ventricle has no regional wall motion abnormalities. Left ventricular diastolic parameters are consistent with Grade I diastolic dysfunction (impaired relaxation). The average left ventricular global longitudinal strain is -26.9 %. The global longitudinal strain is normal. 2. Right ventricular systolic function is normal. The right ventricular size is normal. 3. The mitral valve is normal in structure. No evidence of mitral valve regurgitation. No evidence of mitral stenosis. 4. The aortic valve is normal in structure. Aortic valve regurgitation is not visualized. No aortic stenosis is present. 5. The inferior vena cava is normal in size with greater than 50% respiratory variability, suggesting right atrial pressure of 3 mmHg.  Comparison(s): 07/15/16 EF 55-60%. PA pressure .  FINDINGS Left Ventricle: Left ventricular ejection fraction, by estimation, is 60 to 65%. The left ventricle has normal function. The left ventricle has no regional wall motion abnormalities. The average left ventricular global longitudinal strain is -26.9 %. The global longitudinal strain is normal. The left ventricular internal cavity size was normal in size. There is no left ventricular hypertrophy. Left ventricular diastolic parameters are consistent with Grade I diastolic dysfunction  (impaired relaxation).  Right Ventricle: The right ventricular size is normal. No increase in right ventricular wall thickness. Right ventricular systolic function is normal.  Left Atrium: Left atrial size was normal in size.  Right Atrium: Right atrial size was normal in size.  Pericardium: There is no evidence of pericardial effusion.  Mitral Valve: The mitral valve is normal in structure. No evidence of mitral valve regurgitation. No evidence of mitral valve stenosis.  Tricuspid Valve: The tricuspid valve is normal in structure. Tricuspid valve regurgitation is not demonstrated. No evidence of tricuspid stenosis.  Aortic Valve: The aortic valve is normal in structure. Aortic valve regurgitation is not visualized. No aortic stenosis is present.  Pulmonic Valve: The pulmonic valve was normal in structure. Pulmonic valve regurgitation is trivial. No evidence of pulmonic stenosis.  Aorta: The aortic root is normal in size and structure.  Venous: The inferior vena cava is normal in size with greater than 50% respiratory variability, suggesting right atrial pressure of 3 mmHg.  IAS/Shunts: No atrial level shunt detected by color flow Doppler.   LEFT VENTRICLE PLAX 2D LVIDd:         4.90 cm   Diastology LVIDs:         3.20 cm   LV e' medial:    12.00 cm/s LV PW:         1.00 cm   LV E/e' medial:  8.9 LV IVS:        0.80 cm   LV e' lateral:   12.70 cm/s LVOT diam:     2.10 cm   LV E/e' lateral: 8.4 LV SV:         90 LV SV Index:   51        2D Longitudinal Strain LVOT Area:     3.46 cm  2D Strain GLS (A2C):   -27.0 % 2D Strain GLS (A3C):   -26.5 % 2D Strain GLS (A4C):   -  27.1 % 2D Strain GLS Avg:     -26.9 %  RIGHT VENTRICLE             IVC RV Basal diam:  3.00 cm     IVC diam: 1.70 cm RV S prime:     14.60 cm/s TAPSE (M-mode): 2.1 cm  LEFT ATRIUM             Index        RIGHT ATRIUM           Index LA diam:        3.90 cm 2.21 cm/m   RA Pressure: 3.00 mmHg LA Vol (A2C):    36.9 ml 20.88 ml/m  RA Area:     10.50 cm LA Vol (A4C):   26.4 ml 14.94 ml/m  RA Volume:   20.40 ml  11.54 ml/m LA Biplane Vol: 31.5 ml 17.83 ml/m AORTIC VALVE LVOT Vmax:   139.00 cm/s LVOT Vmean:  93.200 cm/s LVOT VTI:    0.261 m  AORTA Ao Root diam: 2.90 cm Ao Asc diam:  3.20 cm  MITRAL VALVE                TRICUSPID VALVE Estimated RAP:  3.00 mmHg MV Decel Time: 204 msec MV E velocity: 107.00 cm/s  SHUNTS MV A velocity: 111.00 cm/s  Systemic VTI:  0.26 m MV E/A ratio:  0.96         Systemic Diam: 2.10 cm  Donato Schultz MD Electronically signed by Donato Schultz MD Signature Date/Time: 09/03/2021/1:37:03 PM    Final            Recent Labs: 02/05/2023: ALT 10 02/06/2023: BUN 21; Creatinine, Ser 1.11; Hemoglobin 9.4; Magnesium 2.2; Platelets 250; Potassium 4.0; Sodium 136  Recent Lipid Panel    Component Value Date/Time   CHOL 184 08/21/2021 1120   TRIG 106 08/21/2021 1120   HDL 91 08/21/2021 1120   CHOLHDL 2.0 08/21/2021 1120   CHOLHDL 2.8 06/19/2014 1656   VLDL 35 06/19/2014 1656   LDLCALC 75 08/21/2021 1120   LDLDIRECT 77 09/14/2012 1618    History of Present Illness    67 year old female with the above past medical history including chronic diastolic heart failure, DVT, hypertension, hyperlipidemia, CKD stage III, and COPD.  She has a history of chronic dyspnea in the setting of COPD, follows with pulmonology.  She has a history of chronic diastolic heart failure.  Echocardiogram in 08/2021 showed EF 60 to 65%, normal LV function, no RWMA, G1 DD, normal RV, no significant valvular abnormalities.  She has not required diuretic therapy recently.  She was last seen in the office on 12/23/2022 and was stable from a cardiac standpoint.  She did note ongoing dyspnea with exertion.  Lexiscan Myoview in the setting of preoperative cardiac evaluation was negative for ischemia.  She was cleared for ureteroscopy and stent placement at the time.  She presents today for follow-up  accompanied by her husband and for preoperative cardiac evaluation for left total knee replacement with Dr. Weber Cooks of [facility]. Since her last visit she has been stable from a cardiac standpoint.  She has stable chronic dyspnea, on 3 L home O2. She denies any chest pain, palpitations, edema, PND, orthopnea, weight gain.  BP runs low, she denies any dizziness, presyncope or syncope.  Overall, she reports feeling well.  Home Medications    Current Outpatient Medications  Medication Sig Dispense Refill   albuterol (PROAIR HFA) 108 (90  Base) MCG/ACT inhaler INHALE TWO PUFFS INTO THE LUNGS EVERY 6 HOURS AS NEEDED FOR SHORTNESS OF BREATH (Patient taking differently: Inhale 2 puffs into the lungs every 6 (six) hours as needed for shortness of breath or wheezing.) 18 g 6   albuterol (PROVENTIL) (2.5 MG/3ML) 0.083% nebulizer solution USE 1 VIAL IN NEBULIZER EVERY 6 HOURS AS NEEDED FOR SHORTNESS OF BREATH (Patient taking differently: Take 2.5 mg by nebulization every 6 (six) hours as needed for wheezing or shortness of breath.) 75 mL 8   allopurinol (ZYLOPRIM) 100 MG tablet Take 100 mg by mouth daily.     ALPRAZolam (XANAX) 0.25 MG tablet Take 0.25 mg by mouth 2 (two) times daily.     Azelastine HCl (ASTEPRO) 0.15 % SOLN Place 1 spray into the nose 2 (two) times daily as needed (Allergies). 30 mL 5   Cholecalciferol (VITAMIN D3) 50 MCG (2000 UT) capsule TAKE ONE CAPSULE BY MOUTH DAILY (Patient taking differently: Take 2,000 Units by mouth daily.) 90 capsule 1   famotidine (PEPCID) 40 MG tablet Take 40 mg by mouth at bedtime.     ferrous sulfate 324 (65 Fe) MG TBEC TAKE ONE TABLET BY MOUTH EVERY OTHER DAY (Patient taking differently: Take 324 mg by mouth every other day.) 90 tablet 5   gabapentin (NEURONTIN) 100 MG capsule Take 1 capsule (100 mg total) by mouth at bedtime. (Patient taking differently: Take 100 mg by mouth 3 (three) times daily.) 90 capsule 3   guaiFENesin (MUCINEX)  600 MG 12 hr tablet Take 2 tablets (1,200 mg total) by mouth 2 (two) times daily as needed. (Patient taking differently: Take 1,200 mg by mouth 2 (two) times daily as needed for cough or to loosen phlegm.) 60 tablet 5   ipratropium-albuterol (DUONEB) 0.5-2.5 (3) MG/3ML SOLN Take 3 mLs by nebulization every 6 (six) hours as needed (Wheezing/sob).     lamoTRIgine (LAMICTAL) 200 MG tablet TAKE 1 AND 1/2 TABLETS BY MOUTH TWICE DAILY (Patient taking differently: Take 300 mg by mouth 2 (two) times daily.) 270 tablet 1   loratadine (ALLERGY RELIEF) 10 MG tablet Take 1 tablet (10 mg total) by mouth daily. 90 tablet 3   montelukast (SINGULAIR) 10 MG tablet TAKE ONE TABLET BY MOUTH AT BEDTIME 30 tablet 5   ondansetron (ZOFRAN) 4 MG tablet Take 1 tablet (4 mg total) by mouth daily as needed for nausea or vomiting. 30 tablet 1   oxyCODONE-acetaminophen (PERCOCET/ROXICET) 5-325 MG tablet Take 1 tablet by mouth every 6 (six) hours as needed for severe pain. 15 tablet 0   OXYGEN Inhale 3 L into the lungs continuous.     pantoprazole (PROTONIX) 40 MG tablet Take 1 tablet (40 mg total) by mouth 2 (two) times daily. **PLEASE CALL OFFICE TO SCHEDULE FOLLOW UP 60 tablet 0   predniSONE (DELTASONE) 10 MG tablet Take 1 tablet (10 mg total) by mouth daily with breakfast. 10 mg take  4 each am x 2 days,   2 each am x 2 days,  1 each am x 2 days and stop 20 tablet 0   QUEtiapine (SEROQUEL) 100 MG tablet TAKE ONE TABLET BY MOUTH AT BEDTIME (Patient taking differently: Take 100 mg by mouth at bedtime.) 30 tablet 5   simvastatin (ZOCOR) 40 MG tablet TAKE ONE TABLET BY MOUTH AT BEDTIME (Patient taking differently: Take 40 mg by mouth daily at 6 PM.) 90 tablet 2   topiramate (TOPAMAX) 100 MG tablet Take 200 mg twice daily (Patient taking differently: Take 200  mg by mouth 2 (two) times daily.) 360 tablet 2   TRELEGY ELLIPTA 100-62.5-25 MCG/ACT AEPB INHALE 1 PUFF BY MOUTH INTO THE LUNGS DAILY IN THE AFTERNOON (Patient taking  differently: Inhale 1 puff into the lungs daily.) 60 each 5   triamcinolone cream (KENALOG) 0.1 % Apply 1 Application topically daily as needed (irritation).     XARELTO 20 MG TABS tablet Take 20 mg by mouth daily.     No current facility-administered medications for this visit.     Review of Systems    She denies chest pain, palpitations, pnd, orthopnea, n, v, dizziness, syncope, edema, weight gain, or early satiety. All other systems reviewed and are otherwise negative except as noted above.   Physical Exam    VS:  BP 93/64 (BP Location: Right Arm, Patient Position: Sitting, Cuff Size: Normal)   Pulse 91   Ht 5' (1.524 m)   Wt 176 lb (79.8 kg)   SpO2 97%   BMI 34.37 kg/m  GEN: Well nourished, well developed, in no acute distress. HEENT: normal. Neck: Supple, no JVD, carotid bruits, or masses. Cardiac: RRR, no murmurs, rubs, or gallops. No clubbing, cyanosis, edema.  Radials/DP/PT 2+ and equal bilaterally.  Respiratory:  Respirations regular and unlabored, clear to auscultation bilaterally. GI: Soft, nontender, nondistended, BS + x 4. MS: no deformity or atrophy. Skin: warm and dry, no rash. Neuro:  Strength and sensation are intact. Psych: Normal affect.  Accessory Clinical Findings    ECG personally reviewed by me today - EKG Interpretation Date/Time:  Monday May 25 2023 13:44:20 EDT Ventricular Rate:  91 PR Interval:  182 QRS Duration:  64 QT Interval:  346 QTC Calculation: 425 R Axis:   27  Text Interpretation: Normal sinus rhythm Low voltage QRS When compared with ECG of 04-Feb-2023 19:22, PREVIOUS ECG IS PRESENT Confirmed by Bernadene Person ([id number]) on 05/25/2023 2:05:27 PM  - no acute changes.   Lab Results  Component Value Date   WBC 9.7 02/06/2023   HGB 9.4 (L) 02/06/2023   HCT 30.7 (L) 02/06/2023   MCV 100.0 02/06/2023   PLT 250 02/06/2023   Lab Results  Component Value Date   CREATININE 1.11 (H) 02/06/2023   BUN 21 02/06/2023   NA 136 02/06/2023    K 4.0 02/06/2023   CL 107 02/06/2023   CO2 22 02/06/2023   Lab Results  Component Value Date   ALT 10 02/05/2023   AST 12 (L) 02/05/2023   ALKPHOS 85 02/05/2023   BILITOT 0.5 02/05/2023   Lab Results  Component Value Date   CHOL 184 08/21/2021   HDL 91 08/21/2021   LDLCALC 75 08/21/2021   LDLDIRECT 77 09/14/2012   TRIG 106 08/21/2021   CHOLHDL 2.0 08/21/2021    Lab Results  Component Value Date   HGBA1C 5.7 (H) 12/24/2022    Assessment & Plan    1. Chronic diastolic heart failure:  Echocardiogram in 08/2021 showed EF 60 to 65%, normal LV function, no RWMA, G1 DD, normal RV, no significant valvular abnormalities.  She has stable chronic dyspnea in the setting of COPD.  She denies PND, orthopnea, weight gain.  Euvolemic and well compensated on exam.  She has not required diuretic therapy.  2. Chronic dyspnea on exertion/COPD: She has stable chronic dyspnea, unchanged from prior visits.  On 3 L home O2.  Follows with pulmonology.  3. History of DVT: She is no longer on Xarelto.  4. Hypertension: Now with asymptomatic hypotension.  She is  not on any antihypertensive medication at this time.  5. Hyperlipidemia: LDL was 81 in 04/2023.  Monitored and managed per PCP.  Continue simvastatin.  6. CKD stage III: Creatinine was stable at 1.16 in 04/2023.  7. Preoperative cardiac exam: According to the Revised Cardiac Risk Index (RCRI), her Perioperative Risk of Major Cardiac Event is (%): 0.9. Her Functional Capacity in METs is: 3.08 according to the Duke Activity Status Index (DASI).  She is unable to complete greater than 4 METS at baseline.  Lexiscan Myoview in 12/2022 was negative for ischemia. She has no new cardiac symptoms. Reviewed with Dr. Allyson Sabal, who agrees that based on ACC/AHA guidelines, patient would be at acceptable risk for the planned procedure without further cardiovascular testing.  Of note, she states that her Xarelto was discontinued 1 month ago.  I will route this  recommendation to the requesting party via Epic fax function.  Disposition:Follow-up in 6 months, sooner if needed.      Joylene Grapes, NP 05/25/2023, 2:05 PM

## 2023-05-28 ENCOUNTER — Other Ambulatory Visit: Payer: Self-pay | Admitting: Neurology

## 2023-06-01 ENCOUNTER — Ambulatory Visit (INDEPENDENT_AMBULATORY_CARE_PROVIDER_SITE_OTHER): Payer: 59 | Admitting: Neurology

## 2023-06-01 ENCOUNTER — Other Ambulatory Visit: Payer: Self-pay | Admitting: Neurology

## 2023-06-01 ENCOUNTER — Encounter: Payer: Self-pay | Admitting: Neurology

## 2023-06-01 VITALS — BP 110/74 | HR 90 | Resp 15 | Ht 60.0 in

## 2023-06-01 DIAGNOSIS — G959 Disease of spinal cord, unspecified: Secondary | ICD-10-CM | POA: Diagnosis not present

## 2023-06-01 DIAGNOSIS — G43711 Chronic migraine without aura, intractable, with status migrainosus: Secondary | ICD-10-CM

## 2023-06-01 DIAGNOSIS — G40909 Epilepsy, unspecified, not intractable, without status epilepticus: Secondary | ICD-10-CM

## 2023-06-01 MED ORDER — TOPIRAMATE 100 MG PO TABS: 100.0000 mg | ORAL_TABLET | Freq: Two times a day (BID) | ORAL | 3 refills | Status: DC

## 2023-06-01 MED ORDER — QUETIAPINE FUMARATE 100 MG PO TABS: 100.0000 mg | ORAL_TABLET | Freq: Every day | ORAL | 3 refills | Status: DC

## 2023-06-01 MED ORDER — LAMOTRIGINE ER 200 MG PO TB24: 400.0000 mg | ORAL_TABLET | Freq: Every evening | ORAL | 3 refills | Status: DC

## 2023-06-01 NOTE — Progress Notes (Signed)
PATIENT: [name] DOB: [birth year]  REASON FOR VISIT: follow up HISTORY FROM: patient Primary Neurologist: Dr. Teresa Coombs since Dr Anne Hahn retired   HISTORY OF PRESENT ILLNESS: Today 06/01/23. Patient presents today for follow-up, last visit was in August 2023.  Since then, she reports completing physical therapy but still having neck pain worse with head movement, radiating to the back.  In term of the EEG, she reports side effect of lamotrigine in the morning therefore only been taking the medication in the evening.  She does complain of recurrent seizures that she described as staring spell.  She is compliant with the topiramate.  Patient is still on supplemental oxygen.   INTERVAL HISTORY 03/20/2022:  Patient presents today for follow up. Reports no seizures since last visit. She is compliant with her Topiramate and Lamotrigine but now has a new complaint of neck pain. Her EEG was repeated and showed only mild diffuse slowing, no epileptiform discharges. She is not on supplemental oxygen any longer.   INTERVAL HISTORY 10/30/2021: Patient presents today for follow-up, she is accompanied by her daughter-in-law.  She was recently admitted at [facility] for heart failure, UTI and COPD.  Daughter reported that this is her fourth hospitalization since May 2022 for COPD and UTI.  Currently she is on supplemental O2 oxygen.  Daughter in law still report episode of staring, and they are now frequent, she has them daily up to 4 times per day.  During this time she is unresponsive but no other abnormal movement noted.  At last visit, we have plan to obtain an ambulatory EEG but this was not covered by insurance, she did have some issue with transportation therefore could not go to the EMU.  She is currently on lamotrigine 200 mg twice daily and Topamax 200 mg twice daily.  Denies any side effect from the medication.   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 Remains on Lamictal 300 mg BID, Topamax 100/200 mg daily  Has seizures where she will blank out (2 this month), stare off, witnessed by her grandson, lasted no more than 1 minute. Was normal beforehand. Afterwards was tired, groggy, wanted to sleep. In the morning, 2 hours after taking morning pills. Will have spell on average, every 2 months. Can't identify any triggers.  She lives in [place], didn't have transportation, she cancelled the EMU admission.   Headaches are improved. Not daily anymore. Doesn't eat more than 1 meal a day. Takes Tylenol, gets 1 migraine a month that is is significant.   Recently hospitalized for UTI was septic. Still on Seroquel 100 mg at bedtime to help sleep. Is wearing 2 L oxygen daily, forgot it today. Still smoking, 5 cigarette daily. Here today with her grandson, Neita Goodnight.  Update 04/10/2020 SS:Ms. Ryder is a 67 year old female with history of intractable epilepsy and intractable headaches. She is on Lamictal and Topamax, when last seen Depakote was added. She complained of swallowing problems, barium swallow recommended caution with bread and meats, thin liquids, had mild pharyngeal dysphagia.  Could not tolerate Depakote, reported stomach upset, took for 1 week.  Reports 1 seizure of loss of consciousness while lying on the bed, triggered by coughing episode, she says not always coughing related, usually once a month.  Does have episodes of staring, lasting a few seconds, several times a month.  Headaches are fairly well controlled, on  average 3-4 migraines a month, tolerating Aimovig 70 mg every 2 weeks well, has been quite beneficial.  Has noted that her voice is raspy, continues with trouble swallowing, does have COPD, still smokes.  Recently saw PCP, did not mention this.  She does not drive, lives with family.  HISTORY 10/12/2019 Dr. Anne Hahn: [name] is a 67 year old right-handed white  female with a history of intractable epilepsy and intractable headache.  The patient has been on lamotrigine and Topamax without much benefit with her seizures.  She continues to have frequent headaches, her headaches have been daily in nature.  She has not responded to Ajovy.  In the past, she has been on Botox injections and seemed to improve some with the headaches.  More recently in the last month she is having increasing problems with swallowing with both liquids and solids.  She indicates that food will get caught in the back of the throat and she has to cough it back up again.  She is not getting food down in the lungs.  She has noted some hoarseness of the voice.  She is not losing weight, in fact she is gaining weight.  The patient does not operate a motor vehicle.  She was set up for video visit today but cannot access this, a telephone visit therefore was done.  REVIEW OF SYSTEMS: Out of a complete 14 system review of symptoms, the patient complains only of the following symptoms, and all other reviewed systems are negative.  Seizure, headache  ALLERGIES: Allergies  Allergen Reactions   Amoxicillin Anaphylaxis    Breakout, mouth swells   Azithromycin Swelling and Rash    "swelling all over, hives, thrush"   Cefdinir     Throat swelling after 3rd dose   Chantix [Varenicline] Other (See Comments)    "bad dreams, difficulty breathing"   Doxycycline Hyclate Anaphylaxis and Rash    swelling   Levofloxacin Anaphylaxis    Pt can take moxifloxacin   Penicillins Anaphylaxis, Shortness Of Breath and Rash    "ALMOST DIED, ended up in hospital"    Sulfonamide Derivatives Shortness Of Breath, Swelling and Rash    "break out from head to toe"   Toradol [Ketorolac Tromethamine] Other (See Comments)    Seizure    Clarithromycin Hives    swelling   Nortriptyline Swelling, Rash and Other (See Comments)    Per patient had "kidney problems" on this medication, could not use bathroom (urinary  retention)   Paxlovid [Nirmatrelvir-Ritonavir] Swelling    Throat swelling   Codeine Nausea And Vomiting   Fluticasone-Salmeterol Other (See Comments)    REACTION: ulcers in mouth   Triptans Rash    Mouth breaks out and start itching    HOME MEDICATIONS: Outpatient Medications Prior to Visit  Medication Sig Dispense Refill   albuterol (PROAIR HFA) 108 (90 Base) MCG/ACT inhaler INHALE TWO PUFFS INTO THE LUNGS EVERY 6 HOURS AS NEEDED FOR SHORTNESS OF BREATH (Patient taking differently: Inhale 2 puffs into the lungs every 6 (six) hours as needed for shortness of breath or wheezing.) 18 g 6   albuterol (PROVENTIL) (2.5 MG/3ML) 0.083% nebulizer solution USE 1 VIAL IN NEBULIZER EVERY 6 HOURS AS NEEDED FOR SHORTNESS OF BREATH (Patient taking differently: Take 2.5 mg by nebulization every 6 (six) hours as needed for wheezing or shortness of breath.) 75 mL 8   allopurinol (ZYLOPRIM) 100 MG tablet Take 100 mg by mouth daily.     ALPRAZolam (XANAX) 0.25 MG tablet Take 0.25 mg  by mouth 2 (two) times daily.     Azelastine HCl (ASTEPRO) 0.15 % SOLN Place 1 spray into the nose 2 (two) times daily as needed (Allergies). 30 mL 5   Cholecalciferol (VITAMIN D3) 50 MCG (2000 UT) capsule TAKE ONE CAPSULE BY MOUTH DAILY (Patient taking differently: Take 2,000 Units by mouth daily.) 90 capsule 1   famotidine (PEPCID) 40 MG tablet Take 40 mg by mouth at bedtime.     ferrous sulfate 324 (65 Fe) MG TBEC TAKE ONE TABLET BY MOUTH EVERY OTHER DAY (Patient taking differently: Take 324 mg by mouth every other day.) 90 tablet 5   gabapentin (NEURONTIN) 100 MG capsule Take 1 capsule (100 mg total) by mouth at bedtime. (Patient taking differently: Take 100 mg by mouth 3 (three) times daily.) 90 capsule 3   guaiFENesin (MUCINEX) 600 MG 12 hr tablet Take 2 tablets (1,200 mg total) by mouth 2 (two) times daily as needed. (Patient taking differently: Take 1,200 mg by mouth 2 (two) times daily as needed for cough or to loosen  phlegm.) 60 tablet 5   ipratropium-albuterol (DUONEB) 0.5-2.5 (3) MG/3ML SOLN Take 3 mLs by nebulization every 6 (six) hours as needed (Wheezing/sob).     loratadine (ALLERGY RELIEF) 10 MG tablet Take 1 tablet (10 mg total) by mouth daily. 90 tablet 3   montelukast (SINGULAIR) 10 MG tablet TAKE ONE TABLET BY MOUTH AT BEDTIME 30 tablet 5   ondansetron (ZOFRAN) 4 MG tablet Take 1 tablet (4 mg total) by mouth daily as needed for nausea or vomiting. 30 tablet 1   oxyCODONE-acetaminophen (PERCOCET/ROXICET) 5-325 MG tablet Take 1 tablet by mouth every 6 (six) hours as needed for severe pain. 15 tablet 0   OXYGEN Inhale 3 L into the lungs continuous.     pantoprazole (PROTONIX) 40 MG tablet Take 1 tablet (40 mg total) by mouth 2 (two) times daily. **PLEASE CALL OFFICE TO SCHEDULE FOLLOW UP 60 tablet 0   simvastatin (ZOCOR) 40 MG tablet TAKE ONE TABLET BY MOUTH AT BEDTIME (Patient taking differently: Take 40 mg by mouth daily at 6 PM.) 90 tablet 2   TRELEGY ELLIPTA 100-62.5-25 MCG/ACT AEPB INHALE 1 PUFF BY MOUTH INTO THE LUNGS DAILY IN THE AFTERNOON (Patient taking differently: Inhale 1 puff into the lungs daily.) 60 each 5   triamcinolone cream (KENALOG) 0.1 % Apply 1 Application topically daily as needed (irritation).     lamoTRIgine (LAMICTAL) 200 MG tablet TAKE 1 AND 1/2 TABLETS BY MOUTH TWICE DAILY (Patient taking differently: Take 300 mg by mouth 2 (two) times daily.) 270 tablet 1   QUEtiapine (SEROQUEL) 100 MG tablet TAKE ONE TABLET BY MOUTH AT BEDTIME 30 tablet 0   topiramate (TOPAMAX) 100 MG tablet Take 200 mg twice daily (Patient taking differently: Take 200 mg by mouth 2 (two) times daily.) 360 tablet 2   predniSONE (DELTASONE) 10 MG tablet Take 1 tablet (10 mg total) by mouth daily with breakfast. 10 mg take  4 each am x 2 days,   2 each am x 2 days,  1 each am x 2 days and stop 20 tablet 0   XARELTO 20 MG TABS tablet Take 20 mg by mouth daily.     No facility-administered medications prior to  visit.    PAST MEDICAL HISTORY: Past Medical History:  Diagnosis Date   Allergic rhinitis    Anxiety    Arthritis    "hands and legs and feet" (04/27/2012)   Asthma    Cancer (  HCC) 2005   rt arm mole rem cancer   CHF (congestive heart failure) (HCC)    Chronic bronchitis (HCC)    "keep it all the time" (04/27/2012)   Chronic kidney disease    Chronic kidney disease (CKD)    Stage III   COPD (chronic obstructive pulmonary disease) (HCC)    COVID-19 virus infection    03-2021   Depression    PT DENIES   Diverticulosis    DJD (degenerative joint disease)    Emphysema    Epilepsy (HCC)    Epilepsy (HCC)    Esophageal stricture    Exertional dyspnea    GERD (gastroesophageal reflux disease)    Glaucoma    H/O blood clots    Right leg   Heart failure (HCC)    History of COVID-19 03/2021   History of kidney stones    History of nuclear stress test    Myoview 12/25/22: EF 74, no ischemia or infarction, low risk   Hyperlipidemia    Hypertension    IBS (irritable bowel syndrome)    Migraine    Neck pain    Cervical disc   OSA on CPAP    6-7 yrs; pt wears CPAP but has not been able to use it lately due to sinus issues   Oxygen deficiency    USES 3 LITERS 02 CONTINOUS   Pneumonia 2012; 04/27/2012   Recurrent upper respiratory infection (URI)    Right leg DVT (HCC) 1982   groin   Seizures (HCC)    LAST SEIZURE 6 MONTHS OR  LONGER   Sleep apnea    Type II diabetes mellitus (HCC)    No meds since weight loss   UTI (urinary tract infection)    2 weeks ago   Vertigo 10/24/2014    PAST SURGICAL HISTORY: Past Surgical History:  Procedure Laterality Date   ABDOMINAL HYSTERECTOMY  2001   "partial"   BIOPSY  04/16/2021   Procedure: BIOPSY;  Surgeon: Napoleon Form, MD;  Location: [facility];  Service: Endoscopy;;   CARPAL TUNNEL RELEASE  ~ 2008   right   CARPAL TUNNEL RELEASE Right    CATARACT EXTRACTION Bilateral    COLONOSCOPY     COLONOSCOPY WITH  ESOPHAGOGASTRODUODENOSCOPY (EGD)     CYSTOSCOPY/URETEROSCOPY/HOLMIUM LASER/STENT PLACEMENT Left 01/27/2023   Procedure: CYSTOSCOPY LEFT RETROGRADE PYELOGRAM, LEFT URETEROSCOPY, HOLMIUM LASER LITHOTRIPSY, AND LEFT URETERAL STENT PLACEMENT;  Surgeon: Bjorn Pippin, MD;  Location: [facility];  Service: Urology;  Laterality: Left;  60 MINUTES   ESOPHAGOGASTRODUODENOSCOPY (EGD) WITH PROPOFOL N/A 04/16/2021   Procedure: ESOPHAGOGASTRODUODENOSCOPY (EGD) WITH PROPOFOL;  Surgeon: Napoleon Form, MD;  Location: [facility];  Service: Endoscopy;  Laterality: N/A;   KNEE ARTHROSCOPY  1990's   left   NASAL SEPTOPLASTY W/ TURBINOPLASTY Bilateral 05/23/2016   Procedure: NASAL SEPTOPLASTY WITH TURBINATE REDUCTION;  Surgeon: Osborn Coho, MD;  Location: [facility] OR;  Service: ENT;  Laterality: Bilateral;   SHOULDER OPEN ROTATOR CUFF REPAIR  ~ 2010   left   SINOSCOPY     SINUS ENDO WITH FUSION Right 05/23/2016   Procedure: LIMITED RIGHT ENDOSCOPIC SINUS SURGERY;  Surgeon: Osborn Coho, MD;  Location: [facility] OR;  Service: ENT;  Laterality: Right;   TOTAL KNEE ARTHROPLASTY  08/23/2012   right   TOTAL KNEE ARTHROPLASTY  08/23/2012   Procedure: TOTAL KNEE ARTHROPLASTY;  Surgeon: Nilda Simmer, MD;  Location: [facility] OR;  Service: Orthopedics;  Laterality: Right;  RIGHT KNEE ARTHROPLASTY MEDIAL AND LATERAL COMPARTMENTS WITH  PATELLA RESURFACING   TUBAL LIGATION  2001   UPPER GASTROINTESTINAL ENDOSCOPY      FAMILY HISTORY: Family History  Problem Relation Age of Onset   Diabetes Mother    Lung cancer Mother        was a smoker   Stroke Mother    Alcohol abuse Father    Lung cancer Father        was a smoker   Emphysema Father        was a smoker   Heart disease Sister    Other Sister        blood clotting disorder   Diabetes Brother    Seizures Brother    Diabetes Maternal Aunt    Breast cancer Maternal Aunt    Esophageal cancer Maternal Uncle    Diabetes Maternal Uncle    Heart disease Maternal  Grandfather    Diabetes Maternal Grandfather    Bipolar disorder Son    Muscular dystrophy Son    Heart disease Son    Heart disease Other        uncle   Colon cancer Neg Hx    Pancreatic cancer Neg Hx    Stomach cancer Neg Hx    Colon polyps Neg Hx    Rectal cancer Neg Hx     SOCIAL HISTORY: Social History   Socioeconomic History   Marital status: Married    Spouse name: Westly Pam   Number of children: 2   Years of education: 12   Highest education level: Not on file  Occupational History   Occupation: disabled  Tobacco Use   Smoking status: Every Day    Current packs/day: 0.00    Average packs/day: 0.5 packs/day for 45.4 years (22.7 ttl pk-yrs)    Types: Cigarettes    Start date: 08/12/1975    Last attempt to quit: 12/30/2020    Years since quitting: 2.4   Smokeless tobacco: Never   Tobacco comments:    Smoking 6 per day.  Trying to quit.  01/13/2023  Vaping Use   Vaping status: Never Used  Substance and Sexual Activity   Alcohol use: No    Alcohol/week: 0.0 standard drinks of alcohol   Drug use: No   Sexual activity: Never  Other Topics Concern   Not on file  Social History Narrative   Patient lives at home with husband Westly Pam, her son and his wife.    Patient has 2 children.    Patient has a high school education.    Patient is currently unemployed.    Patient is right handed.    Patient drinks 2 cups of caffeine per day.   Social Determinants of Health   Financial Resource Strain: Low Risk  (05/06/2023)   Received from [facility]   Overall Financial Resource Strain (CARDIA)    Difficulty of Paying Living Expenses: Not hard at all  Food Insecurity: No Food Insecurity (05/06/2023)   Received from [facility]   Hunger Vital Sign    Worried About Running Out of Food in the Last Year: Never true    Ran Out of Food in the Last Year: Never true  Transportation Needs: No Transportation Needs (05/06/2023)   Received from [facility] -  Transportation    Lack of Transportation (Medical): No    Lack of Transportation (Non-Medical): No  Physical Activity: Inactive (05/06/2023)   Received from [facility]   Exercise Vital Sign    Days  of Exercise per Week: 0 days    Minutes of Exercise per Session: 0 min  Stress: No Stress Concern Present (05/06/2023)   Received from [facility] - Occupational Stress Questionnaire    Feeling of Stress : Not at all  Social Connections: Socially Integrated (05/06/2023)   Received from [facility]   Social Connection and Isolation Panel [NHANES]    Frequency of Communication with Friends and Family: More than three times a week    Frequency of Social Gatherings with Friends and Family: Three times a week    Attends Religious Services: 1 to 4 times per year    Active Member of Clubs or Organizations: No    Attends Banker Meetings: 1 to 4 times per year    Marital Status: Married  Catering manager Violence: Not At Risk (05/06/2023)   Received from [facility]   Humiliation, Afraid, Rape, and Kick questionnaire    Fear of Current or Ex-Partner: No    Emotionally Abused: No    Physically Abused: No    Sexually Abused: No   PHYSICAL EXAM  Vitals:   06/01/23 1429  BP: 110/74  Pulse: 90  Resp: 15  Height: 5' (1.524 m)    Body mass index is 34.37 kg/m.  Generalized: Well developed, in her wheelchair  Neurological examination  Mentation: Alert oriented to time, place, history taking. Follows all commands speech and language fluent Cranial nerve II-XII: Pupils were equal round reactive to light. Extraocular movements were full, visual field were full on confrontational test. Facial sensation and strength were normal.  Head turning and shoulder shrug  were normal and symmetric. Motor: Good strength of all extremities Sensory: Sensory testing is intact to soft touch on all 4 extremities. No evidence of extinction is  noted.  Coordination: Cerebellar testing reveals good finger-nose-finger Gait and station: In wheelchair, not tested Reflexes: Brisk in the BUEs  DIAGNOSTIC DATA (LABS, IMAGING, TESTING) - I reviewed patient records, labs, notes, testing and imaging myself where available.  Lab Results  Component Value Date   WBC 9.7 02/06/2023   HGB 9.4 (L) 02/06/2023   HCT 30.7 (L) 02/06/2023   MCV 100.0 02/06/2023   PLT 250 02/06/2023      Component Value Date/Time   NA 136 02/06/2023 0809   NA 142 05/24/2021 0950   K 4.0 02/06/2023 0809   CL 107 02/06/2023 0809   CO2 22 02/06/2023 0809   GLUCOSE 95 02/06/2023 0809   BUN 21 02/06/2023 0809   BUN 13 05/24/2021 0950   CREATININE 1.11 (H) 02/06/2023 0809   CREATININE 0.91 06/13/2016 1441   CALCIUM 8.6 (L) 02/06/2023 0809   PROT 7.0 02/05/2023 0518   PROT 7.2 08/21/2021 1120   ALBUMIN 2.7 (L) 02/05/2023 0518   ALBUMIN 4.1 08/21/2021 1120   AST 12 (L) 02/05/2023 0518   ALT 10 02/05/2023 0518   ALKPHOS 85 02/05/2023 0518   BILITOT 0.5 02/05/2023 0518   BILITOT <0.2 08/21/2021 1120   GFRNONAA 55 (L) 02/06/2023 0809   GFRNONAA 70 06/13/2016 1441   GFRAA 68 04/10/2020 1418   GFRAA 80 06/13/2016 1441   Lab Results  Component Value Date   CHOL 184 08/21/2021   HDL 91 08/21/2021   LDLCALC 75 08/21/2021   LDLDIRECT 77 09/14/2012   TRIG 106 08/21/2021   CHOLHDL 2.0 08/21/2021   Lab Results  Component Value Date   HGBA1C 5.7 (H) 12/24/2022  Lab Results  Component Value Date   VITAMINB12 588 11/28/2022   Lab Results  Component Value Date   TSH 2.314 06/19/2014   Routine EEG 11/20/21: Mild diffuse slowing.    ASSESSMENT AND PLAN 67 y.o. year old female  has a past medical history of Allergic rhinitis, Anxiety, Arthritis, Asthma, Cancer (HCC) (2005), CHF (congestive heart failure) (HCC), Chronic bronchitis (HCC), Chronic kidney disease, Chronic kidney disease (CKD), COPD (chronic obstructive pulmonary disease) (HCC), COVID-19  virus infection, Depression, Diverticulosis, DJD (degenerative joint disease), Emphysema, Epilepsy (HCC), Epilepsy (HCC), Esophageal stricture, Exertional dyspnea, GERD (gastroesophageal reflux disease), Glaucoma, H/O blood clots, Heart failure (HCC), History of COVID-19 (03/2021), History of kidney stones, History of nuclear stress test, Hyperlipidemia, Hypertension, IBS (irritable bowel syndrome), Migraine, Neck pain, OSA on CPAP, Oxygen deficiency, Pneumonia (2012; 04/27/2012), Recurrent upper respiratory infection (URI), Right leg DVT (HCC) (1982), Seizures (HCC), Sleep apnea, Type II diabetes mellitus (HCC), UTI (urinary tract infection), and Vertigo (10/24/2014). here with:  1.  Intractable epilepsy -Continue with Topamax 200 mg twice a day,  -Due to side effects, will switch the Lamotrigine to XR, 400 mg nightly -Recent EEG with mild diffuse slowing, no epileptiform discharges.  -Will follow-up in 6 months   2.  Intractable headaches -Headaches are improved, continue with topiramate  3.  Insomnia -Has remained on Seroquel 100 mg at bedtime  4. Cervicalgia  -Physical therapy not beneficial per patient. -MRI Cervical spine  -EMG/NCS of the bilateral upper extremities to rule out cervical myelopathy/Radiculopathy   Orders Placed This Encounter  Procedures   MR CERVICAL SPINE WO CONTRAST   NCV with EMG(electromyography)    Windell Norfolk, MD 06/01/2023, 5:57 PM [facility] [address], [address] [place], [place] [address] [phone]

## 2023-06-03 ENCOUNTER — Telehealth: Payer: Self-pay | Admitting: Neurology

## 2023-06-03 ENCOUNTER — Telehealth: Payer: Self-pay

## 2023-06-03 ENCOUNTER — Other Ambulatory Visit (HOSPITAL_COMMUNITY): Payer: Self-pay

## 2023-06-03 DIAGNOSIS — N1831 Chronic kidney disease, stage 3a: Secondary | ICD-10-CM | POA: Insufficient documentation

## 2023-06-03 NOTE — Telephone Encounter (Signed)
Please advise 

## 2023-06-03 NOTE — Telephone Encounter (Signed)
Dr. Teresa Coombs,  Can you answer? Thanks,  [employer]

## 2023-06-03 NOTE — Telephone Encounter (Signed)
[employer] medicare/Minor medicaid NPR sent to GI [phone] 

## 2023-06-04 NOTE — Telephone Encounter (Signed)
Adjunctive therapy.

## 2023-06-05 NOTE — Telephone Encounter (Signed)
Pharmacy Patient Advocate Encounter   Received notification from Physician's Office that prior authorization for lamoTRIgine ER 200MG  er tablets is required/requested.   Insurance verification completed.   The patient is insured through [facility] .   Per test claim: PA required; PA submitted to [facility] via CoverMyMeds Key/confirmation #/EOC [id number] Status is pending

## 2023-06-10 ENCOUNTER — Ambulatory Visit (INDEPENDENT_AMBULATORY_CARE_PROVIDER_SITE_OTHER): Payer: 59 | Admitting: Neurology

## 2023-06-10 ENCOUNTER — Ambulatory Visit (INDEPENDENT_AMBULATORY_CARE_PROVIDER_SITE_OTHER): Payer: Self-pay | Admitting: Neurology

## 2023-06-10 DIAGNOSIS — Z0289 Encounter for other administrative examinations: Secondary | ICD-10-CM

## 2023-06-10 DIAGNOSIS — G959 Disease of spinal cord, unspecified: Secondary | ICD-10-CM

## 2023-06-10 NOTE — Progress Notes (Signed)
EMG/NCS was normal.

## 2023-06-10 NOTE — Procedures (Addendum)
Full Name: [name] Gender: Female MRN #: [id number] Date of Birth: 1955-12-24    Visit Date: 06/10/2023 10:18 Age: 67 Years Examining Physician: Dr. Levert Feinstein Referring Physician: Dr. Windell Norfolk Height: 5 feet 0 inch History: 67 year old female presenting with neck pain, radiating pain to right upper extremity.  Summary of the test:  Nerve conduction study: Bilateral ulnar sensory and motor responses were normal.  Bilateral median sensory responses showed borderline prolonged peak latency well-preserved snap amplitude.  Bilateral median motor responses were normal.  Electromyography:  Selected needle examination of bilateral upper extremity muscles and cervical paraspinal muscles were normal.    Conclusion: This is a normal study.  There is no electrodiagnostic evidence of active bilateral cervical radiculopathy, or bilateral upper extremity focal neuropathy.    ------------------------------- Levert Feinstein M.D. PhD  [facility] [address], [address] [place], [place] [address] Tel: [phone] Fax: [phone]  Verbal informed consent was obtained from the patient, patient was informed of potential risk of procedure, including bruising, bleeding, hematoma formation, infection, muscle weakness, muscle pain, numbness, among others.        MNC    Nerve / Sites Muscle Latency Ref. Amplitude Ref. Rel Amp Segments Distance Velocity Ref. Area    ms ms mV mV %  cm m/s m/s mVms  R Median - APB     Wrist APB 3.4 <=4.4 7.3 >=4.0 100 Wrist - APB 7   23.3     Upper arm APB 7.8  8.2  112 Upper arm - Wrist 22 51 >=49 25.9  L Median - APB     Wrist APB 4.0 <=4.4 6.7 >=4.0 100 Wrist - APB 7   24.7     Upper arm APB 8.4  4.4  65.8 Upper arm - Wrist 22 50 >=49 14.9  R Ulnar - ADM     Wrist ADM 2.8 <=3.3 9.4 >=6.0 100 Wrist - ADM 7   30.3     B.Elbow ADM 4.6  8.9  94.6 B.Elbow - Wrist 12 68 >=49 29.5     A.Elbow ADM 7.4  8.8  98 A.Elbow - B.Elbow 15  53 >=49 30.1  L Ulnar - ADM     Wrist ADM 3.1 <=3.3 7.7 >=6.0 100 Wrist - ADM 7   23.8     B.Elbow ADM 5.3  7.4  95.7 B.Elbow - Wrist 13 60 >=49 24.6     A.Elbow ADM 7.6  7.2  96.8 A.Elbow - B.Elbow 14 62 >=49 24.6             SNC    Nerve / Sites Rec. Site Peak Lat Ref.  Amp Ref. Segments Distance    ms ms V V  cm  R Median - Orthodromic (Dig II, Mid palm)     Dig II Wrist 3.4 <=3.4 14 >=10 Dig II - Wrist 13  L Median - Orthodromic (Dig II, Mid palm)     Dig II Wrist 3.4 <=3.4 25 >=10 Dig II - Wrist 13  R Ulnar - Orthodromic, (Dig V, Mid palm)     Dig V Wrist 2.8 <=3.1 7 >=5 Dig V - Wrist 11  L Ulnar - Orthodromic, (Dig V, Mid palm)     Dig V Wrist 2.9 <=3.1 6 >=5 Dig V - Wrist 27             F  Wave    Nerve F Lat Ref.   ms ms  R Ulnar - ADM 26.7 <=32.0  L Ulnar - ADM 26.6 <=32.0         EMG Summary Table    Spontaneous MUAP Recruitment  Muscle IA Fib PSW Fasc Other Amp Dur. Poly Pattern  R. First dorsal interosseous Normal None None None _______ Normal Normal Normal Normal  R. Pronator teres Normal None None None _______ Normal Normal Normal Normal  R. Biceps brachii Normal None None None _______ Normal Normal Normal Normal  R. Deltoid Normal None None None _______ Normal Normal Normal Normal  R. Triceps brachii Normal None None None _______ Normal Normal Normal Normal  R. Brachioradialis Normal None None None _______ Normal Normal Normal Normal  L. First dorsal interosseous Normal None None None _______ Normal Normal Normal Normal  L. Biceps brachii Normal None None None _______ Normal Normal Normal Normal  L. Deltoid Normal None None None _______ Normal Normal Normal Normal  L. Triceps brachii Normal None None None _______ Normal Normal Normal Normal  L. Extensor digitorum communis Normal None None None _______ Normal Normal Normal Normal  R. Cervical paraspinals Normal None None None _______ Normal Normal Normal Normal  Left cervical paraspinal Normal None None None  ________ Normal Normal Normal

## 2023-06-12 ENCOUNTER — Other Ambulatory Visit (HOSPITAL_COMMUNITY): Payer: Self-pay

## 2023-06-12 DIAGNOSIS — R569 Unspecified convulsions: Secondary | ICD-10-CM

## 2023-06-12 HISTORY — DX: Unspecified convulsions: R56.9

## 2023-06-12 NOTE — Telephone Encounter (Signed)
Pharmacy Patient Advocate Encounter  Received notification from [facility] that Prior Authorization for lamoTRIgine ER 200MG  er tablets has been APPROVED from 06/05/2023 to 08/10/2024. Ran test claim, Copay is $0. This test claim was processed through [facility]- copay amounts may vary at other pharmacies due to pharmacy/plan contracts, or as the patient moves through the different stages of their insurance plan.   PA #/Case ID/Reference #: PA Case ID #: [id number]

## 2023-06-15 ENCOUNTER — Telehealth: Payer: Self-pay | Admitting: Neurology

## 2023-06-15 MED ORDER — LAMOTRIGINE ER 200 MG PO TB24: 400.0000 mg | ORAL_TABLET | Freq: Every evening | ORAL | 3 refills | Status: DC

## 2023-06-15 NOTE — Telephone Encounter (Signed)
[facility]) asking if patient has been on LamoTRIgine 200 MG TB24 24 hour tablet before. 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 Would like a call back.

## 2023-06-15 NOTE — Telephone Encounter (Signed)
Call to patient who reports she is currently taking lamotrigine 200 mg tablets 1.5 tablet at bedtime for a total of  350 mg at bedtime. She is aware that new script is for 200 mg lamotrigine XR take 2 tablets at bedtime. She denies missing any doses.   Call back to pharmacy and spoke with Katharina Caper, reviewed history and pharmacy location with pharmacist. Script resent to pharmacy for 200 mg lamotrigine XR take 2 tablets at bedtime. Pharmacist appreciative of call and clarity/

## 2023-06-15 NOTE — Telephone Encounter (Signed)
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 Teresa Coombs for for review.

## 2023-06-18 ENCOUNTER — Encounter: Payer: Self-pay | Admitting: Internal Medicine

## 2023-06-18 ENCOUNTER — Ambulatory Visit: Payer: 59 | Admitting: Internal Medicine

## 2023-06-18 NOTE — Progress Notes (Deleted)
Subjective:   Patient ID: [name], female    DOB: 03/06/1956 MRN: [id number]  HPI  78 yowf active smoker with dx of copd aware of sob around 2000 [facility] with exacerbation every few months just disharged from [facility] in 04/2012 for ? CAP with aecopd referred by Dr Wyline Mood for preop for R TKR Jan 13 14   06/18/2012 Pulmonary preop cc doe x one aisle and since last flare using inhaler saba maybe twice weekly and sleeping ok on cpap maintained on spiriva and can't take advair due to mouth irritation.  Min am cough/ congestion with thick white mucus.  rec Try to minimize smoking Try tudorza one puff twice daily instead of spiriva on trial basis for 2 weeks - call if you like it better and we'll send you a prescription     08/06/2012 f/u ov/Kazim Corrales still smoking cc not limitedby breathing but by knees,  thinks tudorza works better but only using once a day. >>no changes   09/10/2012 Post Hospital follow up  Patient presents for a post hospital followup. She was admitted January 13-27, for elective right total knee replacement. She had postop complications with hypoxia. CT chest revealed diffuse bilateral patchy airspace disease. It was felt. Patient had possible intraoperative aspiration versus bacterial pneumonia with compounding edema. Autoimmune workup was essentially negative. She was treated with aggressive antibiotics, IV diuresis, nebulized bronchodilators, and IV steroids. She was encouraged on smoking cessation. She was discharged on oxygen therapy with activity. At 2 L. Since discharge. Patient reports that she is feeling much improved with decreased shortness of breath. She denies any cough, fever, or hemoptysis, orthopnea, PND, or increased leg swelling. She has not smoked since January 13. She was congratulated on smoking cessation. Chest x-ray today does show improved aeration. rec Keep up good work with not smoking  Continue on current regimen.  Wear O2 with activity and  As needed   Follow up Dr. Sherene Sires  In 2 weeks and As needed   > did not return as requested    05/12/2016  f/u ov/Manjit Bufano re:  preop eval for sinus surgery / no med calendar  Chief Complaint  Patient presents with   Follow-up    Pt. says she is not having any sob, coughing with green phlegm, no chest pain  very poor insight into meds / still smoking/ main problem is am cough/ congestion not using mucinex / flutter as rec  Doe = MMRC2 = can't walk a nl pace on a flat grade s sob but does fine slow and flat eg shopping Rec  Work on inhaler technique:  relax and gently blow all the way out then take a nice smooth deep breath back in, triggering the inhaler at same time you start breathing in.  Hold for up to 5 seconds if you can. Blow out thru nose. Rinse and gargle with water when done Plan A = Automatic = dulera 200 Take 2 puffs first thing in am and then another 2 puffs about 12 hours later.  Plan B = Backup Only use your albuterol (proair) as a rescue medication  Plan C = Crisis - only use your albuterol nebulizer if you first try Plan B and it fails to help > ok to use the nebulizer up to every 4 hours but if start needing it regularly call for immediate appointment Pulmonary follow up is as needed - bring your medications with you    [facility] Admit: Admit date: 09/18/2022 Discharge date: 09/21/2022  Principal Problem: Pneumonia of both lungs due to infectious organism COPD (chronic obstructive pulmonary disease) (CMS-HCC) Chronic diastolic heart failure (CMS-HCC) Pulmonary hypertension (CMS-HCC) HLD (hyperlipidemia) Seizure disorder (CMS-HCC) Altered mental status Tobacco dependence Age-related physical debility Obstructive sleep apnea Class 2 obesity with body mass index (BMI) of 35.0 to 35.9 in adult Abnormal finding on urinalysis Acute deep vein thrombosis (DVT) of left lower extremity (CMS-HCC)  Hospital Course:  admitted for Pneumonia of both lungs due to infectious organism on  09/18/2022   [name] is a 67 y.o. female with PMHx as reviewed in the EMR that presented to [facility] and is being admitted for Pneumonia of both lungs due to infectious organism. Patient brought to the ED by EMS today after being found unresponsive by her family. Complains of cough and shortness of breath. On arrival patient's pO2 was 49. Patient also notes she has been having difficulty urinating the past couple of days with scant urine. Wears oxygen at home, 3 L. CPAP at night.Past medical history includes COPD, diastolic heart failure, "bad knees", chronic neck and back pain, home oxygen use, GERD, seizure disorder, hyperlipidemia. [facility]. She was recently hospitalized at [facility] 12/26 through 12/29 with pneumonia. Today's findings include elevated WBC 19.2, creatinine 1.23, pO2 49. Chest x-ray  c/w chronic pulmonary infiltrates, unchanged. CTA of the chest shows multifocal alveolar process likely infectious. Larger area of alveolar consolidation in the left upper lobe. Nonspecific hilar and mediastinal adenopathy. Viral testing negative. Temperature 103.7, tachycardic 120s. Patient has clinically improved, has been treated with IV antibiotics including vancomycin, and aztreonam. Patient has been evaluated by PT, recommended SNF, however patient would like to be discharged home states that her husband and daughter can care for her.  Detailed hospital close/management mentioned below:  Pneumonia of both lungs due to infectious organism, leukocytosis, COPD, sleep apnea Clinically improving, speaking complete sentences. WBC was trended down to 7.8. Blood cultures x 2 no growth 48 hours. Continue vancomycin and aztreonam that were initiated in the [place] for pulmonary toiletry. Will discharge on clindamycin 450 mg every 8 hours x 5 days. Oxygen support, has been refusing CPAP at night Nebulizers every 6 hours  Chronic diastolic heart failure Monitor IV  fluid quantities Monitor for fluid overload Echocardiogram 1/23 normal EF 60 to 65% with G1 DD She is not on home diuretics  Left lower extremity DVT Continue Xarelto Doppler 2/9 confirms DVT in the left common femoral vein Will need outpt Vascular Clinic Referral       09/25/2022  re-establish s/p Admit: f/u ov/[place] office/Jerene Yeager re: GOLD 3 copd / 02 dep and still smoking  maint on telegy  / DNI status per last admit.  Chief Complaint  Patient presents with   Hospitalization Follow-up    [facility] 2/8 pneumonia and sepsis  [facility] 12/26-12/29 COPD    Dyspnea:  unsteady on feet/ clot L leg x one year and leg stayed swollen since   Cough: slt slt cream  Sleeping: hosp bed 10 degrees  SABA use: way too much 02: 3lpm 24/7  Rec Plan A = Automatic = Always=    Trelegy 100 one click each am   Plan B = Backup (to supplement plan A, not to replace it) Only use your albuterol inhaler as a rescue medication Plan C = Crisis (instead of Plan B but only if Plan B stops working) - only use your albuterol nebulizer if you first try Plan B and it  fails to help > ok to use the nebulizer up to every 4 hours but if start needing it regularly call for immediate appointment Also  Ok to try albuterol 15 min before an activity (on alternating days)  that you know would usually make you short of breath and see if it makes any difference and if makes none then don't take albuterol after activity unless you can't catch your breath as this means it's the resting that helps, not the albuterol. After use of Trelegy use arm and hammer tooth paste Call your pharmacist to fine out what the antibiotic was that could tolerate that actually worked  Make sure you check your oxygen saturation  AT  your highest level of activity (not after you stop)   to be sure it stays over 90%  The key is to stop smoking completely before smoking completely stops you! For cough/ congestion > mucinex 1200 mg every 12hours as need and  flutter valve as much as possible  and if can't stop coughing try to switch entirely to the nebulizer up to every 4 hours as needed    06/18/2023  sood pt  f/u  ov/[place] office/Loyola Santino re: GOLD 3 copd  maint on ***  No chief complaint on file.   Dyspnea:  *** Cough: *** Sleeping: ***   resp cc  SABA use: *** 02: ***  Lung cancer screening: ***   No obvious day to day or daytime variability or assoc excess/ purulent sputum or mucus plugs or hemoptysis or cp or chest tightness, subjective wheeze or overt sinus or hb symptoms.    Also denies any obvious fluctuation of symptoms with weather or environmental changes or other aggravating or alleviating factors except as outlined above   No unusual exposure hx or h/o childhood pna/ asthma or knowledge of premature birth.  Current Allergies, Complete Past Medical History, Past Surgical History, Family History, and Social History were reviewed in [employer] record.  ROS  The following are not active complaints unless bolded Hoarseness, sore throat, dysphagia, dental problems, itching, sneezing,  nasal congestion or discharge of excess mucus or purulent secretions, ear ache,   fever, chills, sweats, unintended wt loss or wt gain, classically pleuritic or exertional cp,  orthopnea pnd or arm/hand swelling  or leg swelling, presyncope, palpitations, abdominal pain, anorexia, nausea, vomiting, diarrhea  or change in bowel habits or change in bladder habits, change in stools or change in urine, dysuria, hematuria,  rash, arthralgias, visual complaints, headache, numbness, weakness or ataxia or problems with walking or coordination,  change in mood or  memory.        No outpatient medications have been marked as taking for the 06/18/23 encounter (Appointment) with Nyoka Cowden, MD.               Objective:   Physical Exam  Wts    06/18/2023       ***  09/25/22 174 lb (78.9 kg)  08/07/22 177 lb 14.6 oz (80.7 kg)   07/10/22 176 lb (79.8 kg)       Mild ba***    I personally reviewed images and agree with radiology impression as follows:   Chest CT with contreast   09/18/22 [facility] 1. No PE, aneurysm or dissection identified.  2. Multifocal alveolar process likely infectious etiology. Larger  area of alveolar consolidation in the left upper lobe.   3. Nonspecific hilar and mediastinal adenopathy.  4. Incidental partially imaged left sided nephrolithiasis.  My review:  overall improved vs prior CTa 08/05/22 from [facility] ecept for the LUL as dz which is still quite a small area  09/25/2022 rec cxr but not done     Lab Results  Component Value Date   HGB 9.5 (L) 08/08/2022   HGB 9.6 (L) 08/07/2022   HGB 9.5 (L) 08/06/2022   HGB 10.1 (L) 05/24/2021   HGB 11.6 04/10/2020   HGB 11.8 04/05/2019       Assessment & Plan:

## 2023-06-21 ENCOUNTER — Other Ambulatory Visit: Payer: Self-pay | Admitting: Neurology

## 2023-06-23 ENCOUNTER — Ambulatory Visit
Admission: RE | Admit: 2023-06-23 | Discharge: 2023-06-23 | Disposition: A | Discharge status: Home / Self Care | Payer: 59 | Source: Ambulatory Visit | Attending: Neurology

## 2023-06-23 DIAGNOSIS — G959 Disease of spinal cord, unspecified: Secondary | ICD-10-CM | POA: Diagnosis not present

## 2023-06-23 NOTE — Progress Notes (Unsigned)
Subjective:   Patient ID: [name], female    DOB: 1955/10/05 MRN: [id number]  HPI  77  yowf active smoker with dx of copd aware of sob around 2000 [facility] with exacerbation every few months just disharged from [facility] in 04/2012 for ? CAP with aecopd referred by Dr Wyline Mood for preop for R TKR Jan 13 14   06/18/2012 Pulmonary preop cc doe x one aisle and since last flare using inhaler saba maybe twice weekly and sleeping ok on cpap maintained on spiriva and can't take advair due to mouth irritation.  Min am cough/ congestion with thick white mucus.  rec Try to minimize smoking Try tudorza one puff twice daily instead of spiriva on trial basis for 2 weeks - call if you like it better and we'll send you a prescription     08/06/2012 f/u ov/[name] still smoking cc not limitedby breathing but by knees,  thinks tudorza works better but only using once a day. >>no changes   09/10/2012 Post Hospital follow up  Patient presents for a post hospital followup. She was admitted January 13-27, for elective right total knee replacement. She had postop complications with hypoxia. CT chest revealed diffuse bilateral patchy airspace disease. It was felt. Patient had possible intraoperative aspiration versus bacterial pneumonia with compounding edema. Autoimmune workup was essentially negative. She was treated with aggressive antibiotics, IV diuresis, nebulized bronchodilators, and IV steroids. She was encouraged on smoking cessation. She was discharged on oxygen therapy with activity. At 2 L. Since discharge. Patient reports that she is feeling much improved with decreased shortness of breath. She denies any cough, fever, or hemoptysis, orthopnea, PND, or increased leg swelling. She has not smoked since January 13. She was congratulated on smoking cessation. Chest x-ray today does show improved aeration. rec Keep up good work with not smoking  Continue on current regimen.  Wear O2 with activity and  As needed   Follow up Dr. Sherene Sires  In 2 weeks and As needed   > did not return as requested    05/12/2016  f/u ov/[name] re:  preop eval for sinus surgery / no med calendar  Chief Complaint  Patient presents with   Follow-up    Pt. says she is not having any sob, coughing with green phlegm, no chest pain  very poor insight into meds / still smoking/ main problem is am cough/ congestion not using mucinex / flutter as rec  Doe = MMRC2 = can't walk a nl pace on a flat grade s sob but does fine slow and flat eg shopping Rec  Work on inhaler technique:  relax and gently blow all the way out then take a nice smooth deep breath back in, triggering the inhaler at same time you start breathing in.  Hold for up to 5 seconds if you can. Blow out thru nose. Rinse and gargle with water when done Plan A = Automatic = dulera 200 Take 2 puffs first thing in am and then another 2 puffs about 12 hours later.  Plan B = Backup Only use your albuterol (proair) as a rescue medication  Plan C = Crisis - only use your albuterol nebulizer if you first try Plan B and it fails to help > ok to use the nebulizer up to every 4 hours but if start needing it regularly call for immediate appointment Pulmonary follow up is as needed - bring your medications with you    [facility] Admit: Admit date: 09/18/2022 Discharge date:  09/21/2022 Principal Problem: Pneumonia of both lungs due to infectious organism COPD (chronic obstructive pulmonary disease) (CMS-HCC) Chronic diastolic heart failure (CMS-HCC) Pulmonary hypertension (CMS-HCC) HLD (hyperlipidemia) Seizure disorder (CMS-HCC) Altered mental status Tobacco dependence Age-related physical debility Obstructive sleep apnea Class 2 obesity with body mass index (BMI) of 35.0 to 35.9 in adult Abnormal finding on urinalysis Acute deep vein thrombosis (DVT) of left lower extremity (CMS-HCC)  Hospital Course:  admitted for Pneumonia of both lungs due to infectious organism on  09/18/2022   [name] is a 67 y.o. female with PMHx as reviewed in the EMR that presented to [facility] and is being admitted for Pneumonia of both lungs due to infectious organism. Patient brought to the ED by EMS today after being found unresponsive by her family. Complains of cough and shortness of breath. On arrival patient's pO2 was 49. Patient also notes she has been having difficulty urinating the past couple of days with scant urine. Wears oxygen at home, 3 L. CPAP at night.Past medical history includes COPD, diastolic heart failure, "bad knees", chronic neck and back pain, home oxygen use, GERD, seizure disorder, hyperlipidemia. [facility]. She was recently hospitalized at [facility] 12/26 through 12/29 with pneumonia. Today's findings include elevated WBC 19.2, creatinine 1.23, pO2 49. Chest x-ray  c/w chronic pulmonary infiltrates, unchanged. CTA of the chest shows multifocal alveolar process likely infectious. Larger area of alveolar consolidation in the left upper lobe. Nonspecific hilar and mediastinal adenopathy. Viral testing negative. Temperature 103.7, tachycardic 120s. Patient has clinically improved, has been treated with IV antibiotics including vancomycin, and aztreonam. Patient has been evaluated by PT, recommended SNF, however patient would like to be discharged home states that her husband and daughter can care for her.  Detailed hospital close/management mentioned below:  Pneumonia of both lungs due to infectious organism, leukocytosis, COPD, sleep apnea Clinically improving, speaking complete sentences. WBC was trended down to 7.8. Blood cultures x 2 no growth 48 hours. Continue vancomycin and aztreonam that were initiated in the [place] for pulmonary toiletry. Will discharge on clindamycin 450 mg every 8 hours x 5 days. Oxygen support, has been refusing CPAP at night Nebulizers every 6 hours  Chronic diastolic heart failure Monitor IV  fluid quantities Monitor for fluid overload Echocardiogram 1/23 normal EF 60 to 65% with G1 DD She is not on home diuretics  Left lower extremity DVT Continue Xarelto Doppler 2/9 confirms DVT in the left common femoral vein Will need outpt Vascular Clinic Referral       09/25/2022  re-establish s/p Admit: f/u ov/[name] re: GOLD 3 copd / 02 dep and still smoking  maint on telegy  / DNI status per last admit.  Chief Complaint  Patient presents with   Hospitalization Follow-up    [facility] 2/8 pneumonia and sepsis  AP 12/26-12/29 COPD    Dyspnea:  unsteady on feet/ clot L leg x one year and leg stayed swollen since   Cough: slt slt cream  Sleeping: hosp bed 10 degrees  SABA use: way too much 02: 3lpm 24/7  Rec Plan A = Automatic = Always=    Trelegy 100 one click each am   Plan B = Backup (to supplement plan A, not to replace it) Only use your albuterol inhaler as a rescue medication Plan C = Crisis (instead of Plan B but only if Plan B stops working) - only use your albuterol nebulizer if you first try Plan B and  it fails to help > ok to use the nebulizer up to every 4 hours but if start needing it regularly call for immediate appointment Also  Ok to try albuterol 15 min before an activity (on alternating days)  that you know would usually make you short of breath and see if it makes any difference and if makes none then don't take albuterol after activity unless you can't catch your breath as this means it's the resting that helps, not the albuterol. After use of Trelegy use arm and hammer tooth paste Call your pharmacist to fine out what the antibiotic was that could tolerate that actually worked  Make sure you check your oxygen saturation  AT  your highest level of activity (not after you stop)   to be sure it stays over 90%  The key is to stop smoking completely before smoking completely stops you! For cough/ congestion > mucinex 1200 mg every 12hours as need and  flutter valve as much as possible  and if can't stop coughing try to switch entirely to the nebulizer up to every 4 hours as needed    06/24/2023  sood pt  f/u  ov/[place] office/[name] re: GOLD 3 copd  maint on ***  No chief complaint on file.   Dyspnea:  *** Cough: *** Sleeping: ***   resp cc  SABA use: *** 02: ***  Lung cancer screening: ***   No obvious day to day or daytime variability or assoc excess/ purulent sputum or mucus plugs or hemoptysis or cp or chest tightness, subjective wheeze or overt sinus or hb symptoms.    Also denies any obvious fluctuation of symptoms with weather or environmental changes or other aggravating or alleviating factors except as outlined above   No unusual exposure hx or h/o childhood pna/ asthma or knowledge of premature birth.  Current Allergies, Complete Past Medical History, Past Surgical History, Family History, and Social History were reviewed in [employer] record.  ROS  The following are not active complaints unless bolded Hoarseness, sore throat, dysphagia, dental problems, itching, sneezing,  nasal congestion or discharge of excess mucus or purulent secretions, ear ache,   fever, chills, sweats, unintended wt loss or wt gain, classically pleuritic or exertional cp,  orthopnea pnd or arm/hand swelling  or leg swelling, presyncope, palpitations, abdominal pain, anorexia, nausea, vomiting, diarrhea  or change in bowel habits or change in bladder habits, change in stools or change in urine, dysuria, hematuria,  rash, arthralgias, visual complaints, headache, numbness, weakness or ataxia or problems with walking or coordination,  change in mood or  memory.        No outpatient medications have been marked as taking for the 06/24/23 encounter (Appointment) with Nyoka Cowden, MD.               Objective:   Physical Exam  Wts    06/24/2023       ***  09/25/22 174 lb (78.9 kg)  08/07/22 177 lb 14.6 oz (80.7 kg)   07/10/22 176 lb (79.8 kg)       Mild ba***    I personally reviewed images and agree with radiology impression as follows:   Chest CT with contreast   09/18/22 [facility] 1. No PE, aneurysm or dissection identified.  2. Multifocal alveolar process likely infectious etiology. Larger  area of alveolar consolidation in the left upper lobe.   3. Nonspecific hilar and mediastinal adenopathy.  4. Incidental partially imaged left sided nephrolithiasis.  My review:  overall improved vs prior CTa 08/05/22 from [facility] ecept for the LUL as dz which is still quite a small area  09/25/2022 rec cxr but not done     Lab Results  Component Value Date   HGB 9.5 (L) 08/08/2022   HGB 9.6 (L) 08/07/2022   HGB 9.5 (L) 08/06/2022   HGB 10.1 (L) 05/24/2021   HGB 11.6 04/10/2020   HGB 11.8 04/05/2019       Assessment & Plan:

## 2023-06-24 ENCOUNTER — Ambulatory Visit (INDEPENDENT_AMBULATORY_CARE_PROVIDER_SITE_OTHER): Payer: 59 | Admitting: Internal Medicine

## 2023-06-24 ENCOUNTER — Encounter: Payer: Self-pay | Admitting: Internal Medicine

## 2023-06-24 ENCOUNTER — Ambulatory Visit (HOSPITAL_COMMUNITY)
Admission: RE | Admit: 2023-06-24 | Discharge: 2023-06-24 | Disposition: A | Discharge status: Home / Self Care | Payer: 59 | Source: Ambulatory Visit | Attending: Internal Medicine | Admitting: Internal Medicine

## 2023-06-24 VITALS — BP 111/74 | HR 83 | Ht 60.0 in | Wt 180.0 lb

## 2023-06-24 DIAGNOSIS — J9611 Chronic respiratory failure with hypoxia: Secondary | ICD-10-CM

## 2023-06-24 DIAGNOSIS — F17210 Nicotine dependence, cigarettes, uncomplicated: Secondary | ICD-10-CM

## 2023-06-24 DIAGNOSIS — J449 Chronic obstructive pulmonary disease, unspecified: Secondary | ICD-10-CM | POA: Diagnosis not present

## 2023-06-24 MED ORDER — CIPROFLOXACIN HCL 500 MG PO TABS: 500.0000 mg | ORAL_TABLET | Freq: Two times a day (BID) | ORAL | 0 refills | Status: DC

## 2023-06-24 MED ORDER — PREDNISONE 10 MG PO TABS: ORAL_TABLET | ORAL | 0 refills | Status: DC

## 2023-06-24 NOTE — Assessment & Plan Note (Addendum)
4-5 min discussion re active cigarette smoking in addition to office E&M  Ask about tobacco use:   ongoing  Advise quitting   I took an extended  opportunity with this patient to outline the consequences of continued cigarette use  in airway disorders based on all the data we have from the multiple national lung health studies (perfomed over decades at millions of dollars in cost)  indicating that smoking cessation, not choice of inhalers or pulmonary physicians, is the most important aspect of her care and if wants knee surgery should commit to quit at least 2 weeks preop/ advised  Assess willingness: ?  committed at this point Assist in quit attempt:  Per PCP when ready Arrange follow up:   Follow up per Primary Care planned  For smoking cessation classes call [phone]

## 2023-06-24 NOTE — Assessment & Plan Note (Signed)
Active smoker  -  PFT's  09/27/21  FEV1 1.05 (47 % ) ratio 0.58  p 5 % improvement from saba p ? prior to study with DLCO  9.41 (52%)   and FV curve mildly concave and ERV 51 at wt 189     Mild flare with purulent tracheobronchitis and multiple drug allergies but has tolerated cipro 500 ok x so rx x 10 days with Prednisone 10 mg take  4 each am x 2 days,   2 each am x 2 days,  1 each am x 2 days and stop   Clearly now  Group D (now reclassified as E) in terms of symptom/risk and laba/lama/ICS  therefore appropriate rx at this point >>>  trelegy and approp saba: Re SABA :  I spent extra time with pt today reviewing appropriate use of albuterol for prn use on exertion with the following points: 1) saba is for relief of sob that does not improve by walking a slower pace or resting but rather if the pt does not improve after trying this first. 2) If the pt is convinced, as many are, that saba helps recover from activity faster then it's easy to tell if this is the case by re-challenging : ie stop, take the inhaler, then p 5 minutes try the exact same activity (intensity of workload) that just caused the symptoms and see if they are substantially diminished or not after saba 3) if there is an activity that reproducibly causes the symptoms, try the saba 15 min before the activity on alternate days   If in fact the saba really does help, then fine to continue to use it prn but advised may need to look closer at the maintenance regimen being used to achieve better control of airways disease with exertion.

## 2023-06-24 NOTE — Patient Instructions (Addendum)
Please remember to go to the  x-ray department  @  [facility] for your tests - we will call you with the results when they are available     Cipro 500 mg twice daily x  10days   Prednisone 10 mg take  4 each am x 2 days,   2 each am x 2 days,  1 each am x 2 days and stop    Plan A = Automatic = Always=    Trelegy 100   Plan B = Backup (to supplement plan A, not to replace it) Only use your albuterol inhaler as a rescue medication to be used if you can't catch your breath by resting or doing a relaxed purse lip breathing pattern.  - The less you use it, the better it will work when you need it. - Ok to use the inhaler up to 2 puffs  every 4 hours if you must but call for appointment if use goes up over your usual need - Don't leave home without it !!  (think of it like the spare tire for your car)   Plan C = Crisis (instead of Plan B but only if Plan B stops working) - only use your albuterol nebulizer if you first try Plan B and it fails to help > ok to use the nebulizer up to every 4 hours but if start needing it regularly call for immediate appointment  Make sure you check your oxygen saturation  AT  your highest level of activity (not after you stop)   to be sure it stays over 90% and adjust  02 flow upward to maintain this level if needed but remember to turn it back to previous settings when you stop (to conserve your supply).    You need to be off cigarettes x 2 weeks and cxr needs to be cleared before I can clear you surgery    Please schedule a follow up office visit in 6 weeks, call sooner if needed  - to see nurse practitioner

## 2023-06-24 NOTE — Assessment & Plan Note (Addendum)
Despite smoking and active AB still sats 95% so rec  3lpm hs and Make sure you check your oxygen saturation  AT  your highest level of activity (not after you stop)   to be sure it stays over 90% and adjust  02 flow upward to maintain this level if needed but remember to turn it back to previous settings when you stop (to conserve your supply).  F/u 6 weeks with NP at [facility]   Each maintenance medication was reviewed in detail including emphasizing most importantly the difference between maintenance and prns and under what circumstances the prns are to be triggered using an action plan format where appropriate.  Total time for H and P, chart review, counseling, reviewing hfa/dpi/ 02/ pulse ox  device(s) and generating customized AVS unique to this office visit / same day charting  > 40 min for preop eval

## 2023-06-25 ENCOUNTER — Telehealth: Payer: Self-pay | Admitting: Neurology

## 2023-06-25 NOTE — Telephone Encounter (Signed)
Referral for neurosurgery fax to [facility]. Phone: [phone], [phone]

## 2023-06-25 NOTE — Addendum Note (Signed)
Addended byWindell Norfolk on: 06/25/2023 08:23 AM   Modules accepted: Orders

## 2023-06-26 NOTE — Telephone Encounter (Signed)
Pt sees Beth 12/26 for risk assessment

## 2023-06-29 ENCOUNTER — Ambulatory Visit: Payer: 59 | Admitting: Cardiovascular Disease

## 2023-07-02 ENCOUNTER — Telehealth: Payer: Self-pay | Admitting: Neurology

## 2023-07-02 NOTE — Telephone Encounter (Signed)
Pt states while she is waiting to be scheduled with [facility] , she's like to know if Dr Teresa Coombs will call in something for her pain in neck and 1/2 way down her right arm, please call.

## 2023-07-02 NOTE — Telephone Encounter (Signed)
Return call to patient who states she is having the constant pain in her neck in right arm that she was having at her visit with Dr. Teresa Coombs and has been taking tylenol with no relief. She waiting to be schedule with neurosurgery and requesting something for the pain. Advised I would send to Dr. Teresa Coombs for recommendations. Patient appreciative of call.

## 2023-07-03 ENCOUNTER — Telehealth: Payer: Self-pay | Admitting: Internal Medicine

## 2023-07-03 ENCOUNTER — Telehealth: Payer: Self-pay | Admitting: Neurology

## 2023-07-03 NOTE — Telephone Encounter (Signed)
Patient  has chest x-ray done last week and is wanting results---[phone]

## 2023-07-03 NOTE — Telephone Encounter (Signed)
I called and spoke with the pt and explained that the radiologist has not read the cxr yet and once they do we will contact her. She verbalized understanding and nothing further needed.

## 2023-07-03 NOTE — Telephone Encounter (Signed)
Pt states she has constant pain in her neck in right arm , asking that a message be sent to on call Dr because pain is only worsening.

## 2023-07-03 NOTE — Telephone Encounter (Signed)
Patient is already on Gabapentin, she can take 2 to 3 tablets up to 3 times daily for the radicular pain.

## 2023-07-03 NOTE — Telephone Encounter (Signed)
Called, went straigh to [employer]. Asked her to call back and have me paged if she still needs [place], discussed with Dr. Darrol Jump my colleague!) and we can increase her gabapentin and give her limited dose of tramadol to get her through to neurosugery thank you  MRI scan of cervical spine without contrast showing prominent spondylitic changes throughout most prominent at C4-5 but there is multifactorial mild cord compression and moderate left-sided foraminal narrowing as well at C5-6 there is also mild canal and left-sided foraminal narrowing.

## 2023-07-06 ENCOUNTER — Other Ambulatory Visit: Payer: Self-pay | Admitting: Neurology

## 2023-07-06 MED ORDER — TRAMADOL HCL 50 MG PO TABS: 50.0000 mg | ORAL_TABLET | Freq: Four times a day (QID) | ORAL | 0 refills | Status: DC | PRN

## 2023-07-06 NOTE — Telephone Encounter (Signed)
Prescribed Tramadol 50 mg Q6hrs for pain.

## 2023-07-06 NOTE — Telephone Encounter (Signed)
Called pt regarding this medication that was called in for her. Pt stated that she can not take Tramadol due to last time she took it and she was "knocked out" for two days. Please advise.

## 2023-07-06 NOTE — Telephone Encounter (Signed)
She can start with half of the tablet, otherwise she can see if her PCP can write her pain meds.

## 2023-07-07 ENCOUNTER — Encounter: Payer: Self-pay | Admitting: Internal Medicine

## 2023-07-07 NOTE — Telephone Encounter (Signed)
My reading is no acute findings but final radiology review may take weeks based problems with staffing. I will let her know when I have the final report

## 2023-07-13 ENCOUNTER — Encounter: Payer: Self-pay | Admitting: Nurse Practitioner

## 2023-07-13 ENCOUNTER — Ambulatory Visit (INDEPENDENT_AMBULATORY_CARE_PROVIDER_SITE_OTHER): Payer: 59 | Admitting: Nurse Practitioner

## 2023-07-13 VITALS — BP 112/65 | HR 89 | Ht 60.0 in | Wt 180.2 lb

## 2023-07-13 DIAGNOSIS — R946 Abnormal results of thyroid function studies: Secondary | ICD-10-CM | POA: Diagnosis not present

## 2023-07-13 NOTE — Progress Notes (Signed)
07/13/2023     Endocrinology Consult Note    Subjective:    Patient ID: [name], female    DOB: 15-Apr-1956, PCP Elizabeth Palau, MD.   Past Medical History:  Diagnosis Date   Allergic rhinitis    Anxiety    Arthritis    "hands and legs and feet" (04/27/2012)   Asthma    Cancer (HCC) 2005   rt arm mole rem cancer   CHF (congestive heart failure) (HCC)    Chronic bronchitis (HCC)    "keep it all the time" (04/27/2012)   Chronic kidney disease    Chronic kidney disease (CKD)    Stage III   COPD (chronic obstructive pulmonary disease) (HCC)    COVID-19 virus infection    03-2021   Depression    PT DENIES   Diverticulosis    DJD (degenerative joint disease)    Emphysema    Epilepsy (HCC)    Epilepsy (HCC)    Esophageal stricture    Exertional dyspnea    GERD (gastroesophageal reflux disease)    Glaucoma    H/O blood clots    Right leg   Heart failure (HCC)    History of COVID-19 03/2021   History of kidney stones    History of nuclear stress test    Myoview 12/25/22: EF 74, no ischemia or infarction, low risk   Hyperlipidemia    Hypertension    IBS (irritable bowel syndrome)    Migraine    Neck pain    Cervical disc   OSA on CPAP    6-7 yrs; pt wears CPAP but has not been able to use it lately due to sinus issues   Oxygen deficiency    USES 3 LITERS 02 CONTINOUS   Pneumonia 2012; 04/27/2012   Recurrent upper respiratory infection (URI)    Right leg DVT (HCC) 1982   groin   Seizures (HCC)    LAST SEIZURE 6 MONTHS OR  LONGER   Sleep apnea    Type II diabetes mellitus (HCC)    No meds since weight loss   UTI (urinary tract infection)    2 weeks ago   Vertigo 10/24/2014    Past Surgical History:  Procedure Laterality Date   ABDOMINAL HYSTERECTOMY  2001   "partial"   BIOPSY  04/16/2021   Procedure: BIOPSY;  Surgeon: Napoleon Form, MD;  Location: [facility];  Service: Endoscopy;;   CARPAL TUNNEL RELEASE  ~ 2008   right   CARPAL  TUNNEL RELEASE Right    CATARACT EXTRACTION Bilateral    COLONOSCOPY     COLONOSCOPY WITH ESOPHAGOGASTRODUODENOSCOPY (EGD)     CYSTOSCOPY/URETEROSCOPY/HOLMIUM LASER/STENT PLACEMENT Left 01/27/2023   Procedure: CYSTOSCOPY LEFT RETROGRADE PYELOGRAM, LEFT URETEROSCOPY, HOLMIUM LASER LITHOTRIPSY, AND LEFT URETERAL STENT PLACEMENT;  Surgeon: Bjorn Pippin, MD;  Location: [facility];  Service: Urology;  Laterality: Left;  60 MINUTES   ESOPHAGOGASTRODUODENOSCOPY (EGD) WITH PROPOFOL N/A 04/16/2021   Procedure: ESOPHAGOGASTRODUODENOSCOPY (EGD) WITH PROPOFOL;  Surgeon: Napoleon Form, MD;  Location: [facility];  Service: Endoscopy;  Laterality: N/A;   KNEE ARTHROSCOPY  1990's   left   NASAL SEPTOPLASTY W/ TURBINOPLASTY Bilateral 05/23/2016   Procedure: NASAL SEPTOPLASTY WITH TURBINATE REDUCTION;  Surgeon: Osborn Coho, MD;  Location: [facility] OR;  Service: ENT;  Laterality: Bilateral;   SHOULDER OPEN ROTATOR CUFF REPAIR  ~ 2010   left   SINOSCOPY     SINUS ENDO WITH FUSION Right 05/23/2016   Procedure: LIMITED  RIGHT ENDOSCOPIC SINUS SURGERY;  Surgeon: Osborn Coho, MD;  Location: [facility] OR;  Service: ENT;  Laterality: Right;   TOTAL KNEE ARTHROPLASTY  08/23/2012   right   TOTAL KNEE ARTHROPLASTY  08/23/2012   Procedure: TOTAL KNEE ARTHROPLASTY;  Surgeon: Nilda Simmer, MD;  Location: [facility] OR;  Service: Orthopedics;  Laterality: Right;  RIGHT KNEE ARTHROPLASTY MEDIAL AND LATERAL COMPARTMENTS WITH PATELLA RESURFACING   TUBAL LIGATION  2001   UPPER GASTROINTESTINAL ENDOSCOPY      Social History   Socioeconomic History   Marital status: Married    Spouse name: [name]   Number of children: 2   Years of education: 12   Highest education level: Not on file  Occupational History   Occupation: disabled  Tobacco Use   Smoking status: Every Day    Current packs/day: 0.00    Average packs/day: 0.5 packs/day for 45.4 years (22.7 ttl pk-yrs)    Types: Cigarettes    Start date: 08/12/1975    Last attempt to  quit: 12/30/2020    Years since quitting: 2.5   Smokeless tobacco: Never   Tobacco comments:    Smoking 6 per day.  Trying to quit.  01/13/2023  Vaping Use   Vaping status: Never Used  Substance and Sexual Activity   Alcohol use: No    Alcohol/week: 0.0 standard drinks of alcohol   Drug use: No   Sexual activity: Never  Other Topics Concern   Not on file  Social History Narrative   Patient lives at home with husband [name], her son and his wife.    Patient has 2 children.    Patient has a high school education.    Patient is currently unemployed.    Patient is right handed.    Patient drinks 2 cups of caffeine per day.   Social Determinants of Health   Financial Resource Strain: Low Risk  (05/06/2023)   Received from [facility]   Overall Financial Resource Strain (CARDIA)    Difficulty of Paying Living Expenses: Not hard at all  Food Insecurity: No Food Insecurity (05/06/2023)   Received from [facility]   Hunger Vital Sign    Worried About Running Out of Food in the Last Year: Never true    Ran Out of Food in the Last Year: Never true  Transportation Needs: No Transportation Needs (05/06/2023)   Received from [facility] - Transportation    Lack of Transportation (Medical): No    Lack of Transportation (Non-Medical): No  Physical Activity: Inactive (05/06/2023)   Received from [facility]   Exercise Vital Sign    Days of Exercise per Week: 0 days    Minutes of Exercise per Session: 0 min  Stress: No Stress Concern Present (05/06/2023)   Received from [facility] - Occupational Stress Questionnaire    Feeling of Stress : Not at all  Social Connections: Socially Integrated (05/06/2023)   Received from [facility]   Social Connection and Isolation Panel [NHANES]    Frequency of Communication with Friends and Family: More than three times a week    Frequency of Social Gatherings with Friends and Family:  Three times a week    Attends Religious Services: 1 to 4 times per year    Active Member of Clubs or Organizations: No    Attends Banker Meetings: 1 to 4 times per year    Marital  Status: Married    Family History  Problem Relation Age of Onset   Diabetes Mother    Lung cancer Mother        was a smoker   Stroke Mother    Alcohol abuse Father    Lung cancer Father        was a smoker   Emphysema Father        was a smoker   Heart disease Sister    Other Sister        blood clotting disorder   Diabetes Brother    Seizures Brother    Diabetes Maternal Aunt    Breast cancer Maternal Aunt    Esophageal cancer Maternal Uncle    Diabetes Maternal Uncle    Heart disease Maternal Grandfather    Diabetes Maternal Grandfather    Bipolar disorder Son    Muscular dystrophy Son    Heart disease Son    Heart disease Other        uncle   Colon cancer Neg Hx    Pancreatic cancer Neg Hx    Stomach cancer Neg Hx    Colon polyps Neg Hx    Rectal cancer Neg Hx     Outpatient Encounter Medications as of 07/13/2023  Medication Sig   albuterol (PROAIR HFA) 108 (90 Base) MCG/ACT inhaler INHALE TWO PUFFS INTO THE LUNGS EVERY 6 HOURS AS NEEDED FOR SHORTNESS OF BREATH (Patient taking differently: Inhale 2 puffs into the lungs every 6 (six) hours as needed for shortness of breath or wheezing.)   albuterol (PROVENTIL) (2.5 MG/3ML) 0.083% nebulizer solution USE 1 VIAL IN NEBULIZER EVERY 6 HOURS AS NEEDED FOR SHORTNESS OF BREATH (Patient taking differently: Take 2.5 mg by nebulization every 6 (six) hours as needed for wheezing or shortness of breath.)   allopurinol (ZYLOPRIM) 100 MG tablet Take 100 mg by mouth daily.   ALPRAZolam (XANAX) 0.25 MG tablet Take 0.25 mg by mouth 2 (two) times daily.   Azelastine HCl (ASTEPRO) 0.15 % SOLN Place 1 spray into the nose 2 (two) times daily as needed (Allergies).   Cholecalciferol (VITAMIN D3) 50 MCG (2000 UT) capsule TAKE ONE CAPSULE BY MOUTH  DAILY (Patient taking differently: Take 2,000 Units by mouth daily.)   famotidine (PEPCID) 40 MG tablet Take 40 mg by mouth at bedtime.   gabapentin (NEURONTIN) 100 MG capsule Take 1 capsule (100 mg total) by mouth at bedtime. (Patient taking differently: Take 100 mg by mouth 3 (three) times daily.)   guaiFENesin (MUCINEX) 600 MG 12 hr tablet Take 2 tablets (1,200 mg total) by mouth 2 (two) times daily as needed. (Patient taking differently: Take 1,200 mg by mouth 2 (two) times daily as needed for cough or to loosen phlegm.)   ipratropium-albuterol (DUONEB) 0.5-2.5 (3) MG/3ML SOLN Take 3 mLs by nebulization every 6 (six) hours as needed (Wheezing/sob).   LamoTRIgine 200 MG TB24 24 hour tablet Take 2 tablets (400 mg total) by mouth at bedtime.   loratadine (ALLERGY RELIEF) 10 MG tablet Take 1 tablet (10 mg total) by mouth daily.   montelukast (SINGULAIR) 10 MG tablet TAKE ONE TABLET BY MOUTH AT BEDTIME   ondansetron (ZOFRAN) 4 MG tablet Take 1 tablet (4 mg total) by mouth daily as needed for nausea or vomiting.   oxyCODONE-acetaminophen (PERCOCET/ROXICET) 5-325 MG tablet Take 1 tablet by mouth every 6 (six) hours as needed for severe pain.   OXYGEN Inhale 3 L into the lungs continuous.   pantoprazole (PROTONIX) 40 MG  tablet Take 1 tablet (40 mg total) by mouth 2 (two) times daily. **PLEASE CALL OFFICE TO SCHEDULE FOLLOW UP   QUEtiapine (SEROQUEL) 100 MG tablet TAKE ONE TABLET BY MOUTH AT BEDTIME   simvastatin (ZOCOR) 40 MG tablet TAKE ONE TABLET BY MOUTH AT BEDTIME (Patient taking differently: Take 40 mg by mouth daily at 6 PM.)   topiramate (TOPAMAX) 100 MG tablet TAKE TWO TABLETS BY MOUTH TWICE DAILY   traMADol (ULTRAM) 50 MG tablet Take 1 tablet (50 mg total) by mouth every 6 (six) hours as needed.   TRELEGY ELLIPTA 100-62.5-25 MCG/ACT AEPB INHALE 1 PUFF BY MOUTH INTO THE LUNGS DAILY IN THE AFTERNOON (Patient taking differently: Inhale 1 puff into the lungs daily.)   triamcinolone cream (KENALOG)  0.1 % Apply 1 Application topically daily as needed (irritation).   lamoTRIgine (LAMICTAL) 200 MG tablet TAKE 1 AND 1/2 TABLETS BY MOUTH TWICE DAILY (Patient not taking: Reported on 07/13/2023)   [DISCONTINUED] ciprofloxacin (CIPRO) 500 MG tablet Take 1 tablet (500 mg total) by mouth 2 (two) times daily. (Patient not taking: Reported on 07/13/2023)   [DISCONTINUED] ferrous sulfate 324 (65 Fe) MG TBEC TAKE ONE TABLET BY MOUTH EVERY OTHER DAY (Patient not taking: Reported on 07/13/2023)   [DISCONTINUED] predniSONE (DELTASONE) 10 MG tablet Take  4 each am x 2 days,   2 each am x 2 days,  1 each am x 2 days and stop (Patient not taking: Reported on 07/13/2023)   No facility-administered encounter medications on file as of 07/13/2023.    ALLERGIES: Allergies  Allergen Reactions   Amoxicillin Anaphylaxis    Breakout, mouth swells   Azithromycin Swelling and Rash    "swelling all over, hives, thrush"   Cefdinir     Throat swelling after 3rd dose   Chantix [Varenicline] Other (See Comments)    "bad dreams, difficulty breathing"   Clindamycin Anaphylaxis   Doxycycline Hyclate Anaphylaxis and Rash    swelling   Levofloxacin Anaphylaxis    Pt can take moxifloxacin   Penicillins Anaphylaxis, Shortness Of Breath and Rash    "ALMOST DIED, ended up in hospital"    Sulfonamide Derivatives Shortness Of Breath, Swelling and Rash    "break out from head to toe"   Toradol [Ketorolac Tromethamine] Other (See Comments)    Seizure    Clarithromycin Hives    swelling   Nortriptyline Swelling, Rash and Other (See Comments)    Per patient had "kidney problems" on this medication, could not use bathroom (urinary retention)   Paxlovid [Nirmatrelvir-Ritonavir] Swelling    Throat swelling   Codeine Nausea And Vomiting   Fluticasone-Salmeterol Other (See Comments)    REACTION: ulcers in mouth   Triptans Rash    Mouth breaks out and start itching    VACCINATION STATUS: Immunization History  Administered  Date(s) Administered   Influenza Split 05/22/2011, 05/05/2012   Influenza Whole 05/27/2007, 05/01/2008, 05/22/2009, 05/23/2010   Influenza, High Dose Seasonal PF 05/06/2023   Influenza,inj,Quad PF,6+ Mos 05/10/2013, 04/19/2014, 06/25/2015, 04/15/2016, 05/12/2017, 05/11/2019, 05/16/2020, 05/15/2021   PNEUMOCOCCAL CONJUGATE-20 07/02/2021   Pneumococcal Polysaccharide-23 08/12/1999, 04/19/2014, 06/11/2021   Td 12/09/2001   Tdap 01/05/2013     HPI  [name] is 67 y.o. female who presents today with a medical history as above, accompanied by her grandson. she is being seen in consultation for abnormal thyroid function (concerns for central hypothyroidism) requested by Elizabeth Palau, MD.  she has been dealing with symptoms of fatigue, forgetfulness, generalized joint aches/pains, constipation,  weight gain, cold intolerance, and headaches intermittently since end of 2021 beginning of 2022. These symptoms are progressively worsening and troubling to her.  her most recent thyroid labs revealed normal TSH of 1.297 and low FT4 of 0.64 on 05/06/23.  she does have problems swallowing, had barium swallow showing some penetration but no aspiration.   She does have trouble breathing related to her COPD.   she does not have known family history of thyroid dysfunction and denies family hx of thyroid cancer. she denies personal history of goiter. she is not on any anti-thyroid medications nor on any thyroid hormone supplements. Denies use of Biotin containing supplements.  she is willing to proceed with appropriate work up.  She intermittently uses steroids to help when she has COPD exacerbation which could contribute to some abnormal thyroid labs.  She also notes she has had pneumonia several times in the last year, was hospitalized for this as well as sepsis secondary to UTI.    She has never had any radiation to her head or neck in the past.  She has had CT and MRI of her head in the past and no obvious  problems with her pituitary gland were identified at that time.   Review of systems  Constitutional: + increasing body weight (states she is regaining some previously lost unintentionally), current Body mass index is 35.19 kg/m., + fatigue, no subjective hyperthermia, + subjective hypothermia, + intermittent headaches Eyes: no blurry vision, no xerophthalmia ENT: no sore throat, no nodules palpated in throat, + intermittent dysphagia/odynophagia, no hoarseness Cardiovascular: no chest pain, + shortness of breath (Hx COPD), no palpitations, no leg swelling Respiratory: no cough, + shortness of breath (Hx COPD) Gastrointestinal: no nausea/vomiting/diarrhea, + chronic constipation Musculoskeletal: + diffuse chronic muscle/joint aches Skin: no rashes, no hyperemia Neurological: no tremors, no numbness, no tingling, no dizziness, + hx seizures (most recent was on Thanksgiving) Psychiatric: no depression, no anxiety   Objective:    BP 112/65 (BP Location: Right Arm, Patient Position: Sitting, Cuff Size: Large)   Pulse 89   Ht 5' (1.524 m)   Wt 180 lb 3.2 oz (81.7 kg)   BMI 35.19 kg/m   Wt Readings from Last 3 Encounters:  07/13/23 180 lb 3.2 oz (81.7 kg)  06/24/23 180 lb (81.6 kg)  05/25/23 176 lb (79.8 kg)     BP Readings from Last 3 Encounters:  07/13/23 112/65  06/24/23 111/74  06/01/23 110/74                        Physical Exam- Limited  Constitutional:  Body mass index is 35.19 kg/m. , not in acute distress, normal state of mind Eyes:  EOMI, no exophthalmos Neck: Supple Thyroid: No gross goiter, no palpable nodularity Cardiovascular: RRR, no murmurs, rubs, or gallops, no edema Respiratory: Adequate breathing efforts, no crackles, rales, rhonchi, + wheezing to BLE L>R Musculoskeletal: no gross deformities, strength intact in all four extremities, no gross restriction of joint movements Skin:  no rashes, no hyperemia Neurological: slight tremor with outstretched hands  on left side only, DTR normal in BLE   CMP     Component Value Date/Time   NA 136 02/06/2023 0809   NA 142 05/24/2021 0950   K 4.0 02/06/2023 0809   CL 107 02/06/2023 0809   CO2 22 02/06/2023 0809   GLUCOSE 95 02/06/2023 0809   BUN 21 02/06/2023 0809   BUN 13 05/24/2021 0950   CREATININE 1.11 (H) 02/06/2023  0809   CREATININE 0.91 06/13/2016 1441   CALCIUM 8.6 (L) 02/06/2023 0809   PROT 7.0 02/05/2023 0518   PROT 7.2 08/21/2021 1120   ALBUMIN 2.7 (L) 02/05/2023 0518   ALBUMIN 4.1 08/21/2021 1120   AST 12 (L) 02/05/2023 0518   ALT 10 02/05/2023 0518   ALKPHOS 85 02/05/2023 0518   BILITOT 0.5 02/05/2023 0518   BILITOT <0.2 08/21/2021 1120   GFR 59.31 (L) 11/28/2022 1508   EGFR 48 (L) 05/24/2021 0950   GFRNONAA 55 (L) 02/06/2023 0809   GFRNONAA 70 06/13/2016 1441     CBC    Component Value Date/Time   WBC 9.7 02/06/2023 0809   RBC 3.07 (L) 02/06/2023 0809   HGB 9.4 (L) 02/06/2023 0809   HGB 10.1 (L) 05/24/2021 0950   HCT 30.7 (L) 02/06/2023 0809   HCT 30.9 (L) 05/24/2021 0950   PLT 250 02/06/2023 0809   PLT 297 05/24/2021 0950   MCV 100.0 02/06/2023 0809   MCV 93 05/24/2021 0950   MCH 30.6 02/06/2023 0809   MCHC 30.6 02/06/2023 0809   RDW 15.3 02/06/2023 0809   RDW 14.4 05/24/2021 0950   LYMPHSABS 1.8 02/04/2023 1950   LYMPHSABS 1.7 05/24/2021 0950   MONOABS 1.5 (H) 02/04/2023 1950   EOSABS 0.0 02/04/2023 1950   EOSABS 0.2 05/24/2021 0950   BASOSABS 0.1 02/04/2023 1950   BASOSABS 0.0 05/24/2021 0950     Diabetic Labs (most recent): Lab Results  Component Value Date   HGBA1C 5.7 (H) 12/24/2022   HGBA1C 5.5 05/16/2020   HGBA1C 5.6 11/30/2019    Lipid Panel     Component Value Date/Time   CHOL 184 08/21/2021 1120   TRIG 106 08/21/2021 1120   HDL 91 08/21/2021 1120   CHOLHDL 2.0 08/21/2021 1120   CHOLHDL 2.8 06/19/2014 1656   VLDL 35 06/19/2014 1656   LDLCALC 75 08/21/2021 1120   LDLDIRECT 77 09/14/2012 1618   LABVLDL 18 08/21/2021 1120         Latest Reference Range & Units 08/27/09 19:12 03/27/10 21:41 01/30/12 17:00 08/07/13 09:50 06/19/14 16:56 05/06/23 00:00  TSH 0.41 - 5.90  2.393 1.605 2.473 0.719 2.314 1.30 (E)  (E): External lab result    Assessment & Plan:   1. Abnormal thyroid function test  she is being seen at a kind request of Elizabeth Palau, MD.  her history and most recent labs are reviewed, and she was examined clinically.  she agrees to proceed with diagnostic workup and treatment plan.  I am not too concerned about central hypothyroidism at this time, evidence points more towards acute inflammation of the thyroid which tends to go through a period of time resetting vs drug induced abnormalities from intermittent steroid use.   I will repeat full profile thyroid function tests today (including antibody testing to assess for autoimmune thyroid dysfunction). Will also order thyroid ultrasound to rule out thyroid etiology for recent trouble swallowing.        -Patient is advised to maintain close follow up with Elizabeth Palau, MD for primary care needs.   - Time spent with the patient: 60 minutes, of which >50% was spent in obtaining information about her symptoms, reviewing her previous labs, evaluations, and treatments, counseling her about her abnormal thyroid function studies , and developing a plan to confirm the diagnosis and long term treatment as necessary. Please refer to "Patient Self Inventory" in the Media tab for reviewed elements of pertinent patient history.  Cecilie Kicks Wageman  participated in the discussions, expressed understanding, and voiced agreement with the above plans.  All questions were answered to her satisfaction. she is encouraged to contact clinic should she have any questions or concerns prior to her return visit.   Follow up plan: Return in about 1 month (around 08/13/2023) for Thyroid follow up, Previsit labs.   Thank you for involving me in the care of this pleasant patient, and  I will continue to update you with her progress.    Ronny Bacon, [facility] [address] [place], [place] [address] Phone: [phone] Fax: [phone]  07/13/2023, 2:26 PM

## 2023-07-13 NOTE — Patient Instructions (Signed)
Thyroid-Stimulating Hormone Test Why am I having this test? The thyroid is a gland in the lower front of the neck. It makes hormones that affect many body parts and systems, including the system that affects how quickly the body burns fuel for energy (metabolism). The pituitary gland is located just below the brain, behind the eyes and nasal passages. It helps maintain thyroid hormone levels and thyroid gland function. You may have a thyroid-stimulating hormone (TSH) test if you have possible symptoms of abnormal thyroid hormone levels. This test can help your health care provider: Diagnose a disorder of the thyroid gland or pituitary gland. Manage your condition and treatment if you have an underactive thyroid (hypothyroidism) or an overactive thyroid (hyperthyroidism). Newborn babies may have this test done to screen for hypothyroidism that is present at birth (congenital). What is being tested? This test measures the amount of TSH in your blood. TSH may also be called thyrotropin. When the thyroid does not make enough hormones, the pituitary gland releases TSH into the bloodstream to stimulate the thyroid gland to make more hormones. What kind of sample is taken?     A blood sample is required for this test. It is usually collected by inserting a needle into a blood vessel. For newborns, a small amount of blood may be collected from the umbilical cord, or by using a small needle to prick the baby's heel (heel stick). Tell a health care provider about: All medicines you are taking, including vitamins, herbs, eye drops, creams, and over-the-counter medicines. Any bleeding problems you have. Any surgeries you have had. Any medical conditions you have. Whether you are pregnant or may be pregnant. How are the results reported? Your test results will be reported as a value that indicates how much TSH is in your blood. Your health care provider will compare your results to normal ranges that were  established after testing a large group of people (reference ranges). Reference ranges may vary among labs and hospitals. For this test, common reference ranges are: Adult: 2-10 microunits/mL or 2-10 milliunits/L. Newborn: Heel stick: 3-18 microunits/mL or 3-18 milliunits/L. Umbilical cord: 3-12 microunits/mL or 3-12 milliunits/L. What do the results mean? Results that are within the reference range are considered normal. This means that you have a normal amount of TSH in your blood. Results that are higher than the reference range mean that your TSH levels are too high. This may mean: Your thyroid gland is not making enough thyroid hormones. Your thyroid medicine dosage is too low. You have a tumor on your pituitary gland. This is rare. Results that are lower than the reference range mean that your TSH levels are too low. This may be caused by hyperthyroidism or by a problem with the pituitary gland function. Talk with your health care provider about what your results mean. Questions to ask your health care provider Ask your health care provider, or the department that is doing the test: When will my results be ready? How will I get my results? What are my treatment options? What other tests do I need? What are my next steps? Summary You may have a thyroid-stimulating hormone (TSH) test if you have possible symptoms of abnormal thyroid hormone levels. The thyroid is a gland in the lower front of the neck. It makes hormones that affect many body parts and systems. The pituitary gland is located just below the brain, behind the eyes and nasal passages. It helps maintain thyroid hormone levels and thyroid gland function. This test   measures the amount of TSH in your blood. TSH is made by the pituitary gland. It may also be called thyrotropin. This information is not intended to replace advice given to you by your health care provider. Make sure you discuss any questions you have with your  health care provider. Document Revised: 07/30/2021 Document Reviewed: 07/30/2021 Elsevier Patient Education  2024 Elsevier Inc.  

## 2023-07-20 ENCOUNTER — Ambulatory Visit: Payer: 59 | Admitting: Internal Medicine

## 2023-07-29 ENCOUNTER — Other Ambulatory Visit: Payer: Self-pay | Admitting: Neurology

## 2023-07-29 ENCOUNTER — Telehealth: Payer: Self-pay | Admitting: Neurology

## 2023-07-29 MED ORDER — LEVETIRACETAM 250 MG PO TABS: 250.0000 mg | ORAL_TABLET | Freq: Two times a day (BID) | ORAL | 6 refills | Status: DC

## 2023-07-29 NOTE — Telephone Encounter (Signed)
Pt states lamoTRIgine (LAMICTAL) 200 MG tablet makes her feel like she can't function( can't walk or talk, very tired) and would like to discuss other options. Requesting call back

## 2023-07-29 NOTE — Telephone Encounter (Signed)
Please advise patient to discontinue Lamotrigine. We will try her on low Keppra 250 mg BID, 250 mg nightly for one week then increase to 250 mg twice daily. Please continue Topiramate

## 2023-07-29 NOTE — Telephone Encounter (Signed)
Returned call to patient and sent my chart. Patient in agreement and verbalized understanding of adding keppra and stopping the lamotrigine.   Spoke with jackie at  pharmacy and confirmed lamotrigine was removed from profile.

## 2023-07-29 NOTE — Telephone Encounter (Signed)
Call to patient, who reports the lamictal making  it   Off for a week

## 2023-07-29 NOTE — Telephone Encounter (Signed)
Call to patient, [name] reports [name] can't take the lamotrigine because [name] can't walk and talk while on the medication. [name] stopped using the lamotrigine 1 week ago. [name] also reports having 6 staring seizures around thanksgiving but states [name] did not let us know. [name] dose report being compliant with topamax. [name] denies and other seizure triggers. Advised I would send to Dr. Teresa Coombs for review/recommendations. Patient appreciative of call.

## 2023-08-06 ENCOUNTER — Ambulatory Visit: Payer: 59 | Admitting: Primary Care

## 2023-08-06 ENCOUNTER — Encounter: Payer: Self-pay | Admitting: Primary Care

## 2023-08-06 VITALS — BP 120/64 | HR 84 | Ht 60.0 in | Wt 182.8 lb

## 2023-08-06 DIAGNOSIS — J449 Chronic obstructive pulmonary disease, unspecified: Secondary | ICD-10-CM | POA: Diagnosis not present

## 2023-08-06 DIAGNOSIS — J9611 Chronic respiratory failure with hypoxia: Secondary | ICD-10-CM

## 2023-08-06 DIAGNOSIS — F17210 Nicotine dependence, cigarettes, uncomplicated: Secondary | ICD-10-CM

## 2023-08-06 MED ORDER — TRELEGY ELLIPTA 200-62.5-25 MCG/ACT IN AEPB: 1.0000 | INHALATION_SPRAY | Freq: Every day | RESPIRATORY_TRACT | 4 refills | Status: DC

## 2023-08-06 MED ORDER — TRELEGY ELLIPTA 200-62.5-25 MCG/ACT IN AEPB: 1.0000 | INHALATION_SPRAY | Freq: Every day | RESPIRATORY_TRACT | Status: DC

## 2023-08-06 MED ORDER — NICOTINE 14 MG/24HR TD PT24: 14.0000 mg | MEDICATED_PATCH | Freq: Every day | TRANSDERMAL | 0 refills | Status: DC

## 2023-08-06 NOTE — Progress Notes (Signed)
@Patient  ID: [name], female    DOB: 1956-06-06, 67 y.o.   MRN: [id number]  No chief complaint on file.   Referring provider: Rayaan Garguilo Palau, MD 67 year old female, current smoker. PMH significant for COPD gold 3, chronic bronchitis, chronic respiratory failure with hypoxia, OSA, chronic rhinitis, GERD, dysphagia, HfpEF, chronic diastolic HF, pulmonary HTN, osteopenia, seizure disorder, CKD, obesity. Former Dr. Craige Cotta patient, patient saw Dr. Sherene Sires in November to re-establish care.   - PFT's 09/27/21 FEV1 1.05 (47 % ) ratio 0.58 p 5 % improvement from saba p ? prior to study with DLCO 9.41 (52%) and FV curve mildly concave and ERV 51 at wt 189   08/06/2023- Interim hx  Discussed the use of AI scribe software for clinical note transcription with the patient, who gave verbal consent to proceed.  Felt to have COPD GOLD D based on clinically symptoms. She was treated for flare up in November by Dr. Sherene Sires with Cipro x 10 days and prednisone taper. Maintaed on Trelegy.   Patient present today for 6 week follow-up. Recommend patient be off cigarettes x2 weeks and CXR before she can be cleared for surgery. CXR on 06/24/23 showed no active cardiopulmonary disease, emphysema and bronchitic changes.   She reports a recent visit to [facility] due to neck issues, with a plan for potential spinal surgery. Prior to surgery, she is scheduled to receive injections for neck pain.  The patient was recently treated by Dr. Sherene Sires for a COPD flare-up, with a course of Cipro and prednisone. She reports improvement in symptoms following this treatment, returning to her baseline health status. She experiences occasional shortness of breath and a chronic cough, but denies any colored mucus. She also reports chronic hoarseness, which she does not believe is related to her Trelegy.  The patient is a current smoker, consuming half a pack a day for approximately 45 years. She expresses a desire to quit smoking,  particularly in light of the potential upcoming surgery.  The patient has received her flu shot, but has not received the RSV vaccine.      Allergies  Allergen Reactions   Amoxicillin Anaphylaxis    Breakout, mouth swells   Azithromycin Swelling and Rash    "swelling all over, hives, thrush"   Cefdinir     Throat swelling after 3rd dose   Chantix [Varenicline] Other (See Comments)    "bad dreams, difficulty breathing"   Clindamycin Anaphylaxis   Doxycycline Hyclate Anaphylaxis and Rash    swelling   Levofloxacin Anaphylaxis    Pt can take moxifloxacin   Penicillins Anaphylaxis, Shortness Of Breath and Rash    "ALMOST DIED, ended up in hospital"    Sulfonamide Derivatives Shortness Of Breath, Swelling and Rash    "break out from head to toe"   Toradol [Ketorolac Tromethamine] Other (See Comments)    Seizure    Clarithromycin Hives    swelling   Nortriptyline Swelling, Rash and Other (See Comments)    Per patient had "kidney problems" on this medication, could not use bathroom (urinary retention)   Paxlovid [Nirmatrelvir-Ritonavir] Swelling    Throat swelling   Codeine Nausea And Vomiting   Fluticasone-Salmeterol Other (See Comments)    REACTION: ulcers in mouth   Triptans Rash    Mouth breaks out and start itching    Immunization History  Administered Date(s) Administered   Influenza Split 05/22/2011, 05/05/2012   Influenza Whole 05/27/2007, 05/01/2008, 05/22/2009, 05/23/2010   Influenza, High Dose Seasonal  PF 05/06/2023   Influenza,inj,Quad PF,6+ Mos 05/10/2013, 04/19/2014, 06/25/2015, 04/15/2016, 05/12/2017, 05/11/2019, 05/16/2020, 05/15/2021   PNEUMOCOCCAL CONJUGATE-20 07/02/2021   Pneumococcal Polysaccharide-23 08/12/1999, 04/19/2014, 06/11/2021   Td 12/09/2001   Tdap 01/05/2013    Past Medical History:  Diagnosis Date   Allergic rhinitis    Anxiety    Arthritis    "hands and legs and feet" (04/27/2012)   Asthma    Cancer (HCC) 2005   rt arm mole rem  cancer   CHF (congestive heart failure) (HCC)    Chronic bronchitis (HCC)    "keep it all the time" (04/27/2012)   Chronic kidney disease    Chronic kidney disease (CKD)    Stage III   COPD (chronic obstructive pulmonary disease) (HCC)    COVID-19 virus infection    03-2021   Depression    PT DENIES   Diverticulosis    DJD (degenerative joint disease)    Emphysema    Epilepsy (HCC)    Epilepsy (HCC)    Esophageal stricture    Exertional dyspnea    GERD (gastroesophageal reflux disease)    Glaucoma    H/O blood clots    Right leg   Heart failure (HCC)    History of COVID-19 03/2021   History of kidney stones    History of nuclear stress test    Myoview 12/25/22: EF 74, no ischemia or infarction, low risk   Hyperlipidemia    Hypertension    IBS (irritable bowel syndrome)    Migraine    Neck pain    Cervical disc   OSA on CPAP    6-7 yrs; pt wears CPAP but has not been able to use it lately due to sinus issues   Oxygen deficiency    USES 3 LITERS 02 CONTINOUS   Pneumonia 2012; 04/27/2012   Recurrent upper respiratory infection (URI)    Right leg DVT (HCC) 1982   groin   Seizures (HCC)    LAST SEIZURE 6 MONTHS OR  LONGER   Sleep apnea    Type II diabetes mellitus (HCC)    No meds since weight loss   UTI (urinary tract infection)    2 weeks ago   Vertigo 10/24/2014    Tobacco History: Social History   Tobacco Use  Smoking Status Every Day   Current packs/day: 0.00   Average packs/day: 0.5 packs/day for 45.4 years (22.7 ttl pk-yrs)   Types: Cigarettes   Start date: 08/12/1975   Last attempt to quit: 12/30/2020   Years since quitting: 2.6  Smokeless Tobacco Never  Tobacco Comments   Smoking 6 per day.  Trying to quit.  01/13/2023   Ready to quit: Not Answered Counseling given: Not Answered Tobacco comments: Smoking 6 per day.  Trying to quit.  01/13/2023   Outpatient Medications Prior to Visit  Medication Sig Dispense Refill   albuterol (PROAIR HFA) 108 (90  Base) MCG/ACT inhaler INHALE TWO PUFFS INTO THE LUNGS EVERY 6 HOURS AS NEEDED FOR SHORTNESS OF BREATH (Patient taking differently: Inhale 2 puffs into the lungs every 6 (six) hours as needed for shortness of breath or wheezing.) 18 g 6   albuterol (PROVENTIL) (2.5 MG/3ML) 0.083% nebulizer solution USE 1 VIAL IN NEBULIZER EVERY 6 HOURS AS NEEDED FOR SHORTNESS OF BREATH (Patient taking differently: Take 2.5 mg by nebulization every 6 (six) hours as needed for wheezing or shortness of breath.) 75 mL 8   allopurinol (ZYLOPRIM) 100 MG tablet Take 100 mg by mouth daily.  ALPRAZolam (XANAX) 0.25 MG tablet Take 0.25 mg by mouth 2 (two) times daily.     Azelastine HCl (ASTEPRO) 0.15 % SOLN Place 1 spray into the nose 2 (two) times daily as needed (Allergies). 30 mL 5   Cholecalciferol (VITAMIN D3) 50 MCG (2000 UT) capsule TAKE ONE CAPSULE BY MOUTH DAILY (Patient taking differently: Take 2,000 Units by mouth daily.) 90 capsule 1   famotidine (PEPCID) 40 MG tablet Take 40 mg by mouth at bedtime.     gabapentin (NEURONTIN) 100 MG capsule Take 1 capsule (100 mg total) by mouth at bedtime. (Patient taking differently: Take 100 mg by mouth 3 (three) times daily.) 90 capsule 3   guaiFENesin (MUCINEX) 600 MG 12 hr tablet Take 2 tablets (1,200 mg total) by mouth 2 (two) times daily as needed. (Patient taking differently: Take 1,200 mg by mouth 2 (two) times daily as needed for cough or to loosen phlegm.) 60 tablet 5   ipratropium-albuterol (DUONEB) 0.5-2.5 (3) MG/3ML SOLN Take 3 mLs by nebulization every 6 (six) hours as needed (Wheezing/sob).     levETIRAcetam (KEPPRA) 250 MG tablet Take 1 tablet (250 mg total) by mouth 2 (two) times daily. 60 tablet 6   loratadine (ALLERGY RELIEF) 10 MG tablet Take 1 tablet (10 mg total) by mouth daily. 90 tablet 3   montelukast (SINGULAIR) 10 MG tablet TAKE ONE TABLET BY MOUTH AT BEDTIME 30 tablet 5   ondansetron (ZOFRAN) 4 MG tablet Take 1 tablet (4 mg total) by mouth daily as  needed for nausea or vomiting. 30 tablet 1   oxyCODONE-acetaminophen (PERCOCET/ROXICET) 5-325 MG tablet Take 1 tablet by mouth every 6 (six) hours as needed for severe pain. 15 tablet 0   OXYGEN Inhale 3 L into the lungs continuous.     pantoprazole (PROTONIX) 40 MG tablet Take 1 tablet (40 mg total) by mouth 2 (two) times daily. **PLEASE CALL OFFICE TO SCHEDULE FOLLOW UP 60 tablet 0   QUEtiapine (SEROQUEL) 100 MG tablet TAKE ONE TABLET BY MOUTH AT BEDTIME 30 tablet 0   simvastatin (ZOCOR) 40 MG tablet TAKE ONE TABLET BY MOUTH AT BEDTIME (Patient taking differently: Take 40 mg by mouth daily at 6 PM.) 90 tablet 2   topiramate (TOPAMAX) 100 MG tablet TAKE TWO TABLETS BY MOUTH TWICE DAILY 360 tablet 2   traMADol (ULTRAM) 50 MG tablet Take 1 tablet (50 mg total) by mouth every 6 (six) hours as needed. 30 tablet 0   TRELEGY ELLIPTA 100-62.5-25 MCG/ACT AEPB INHALE 1 PUFF BY MOUTH INTO THE LUNGS DAILY IN THE AFTERNOON (Patient taking differently: Inhale 1 puff into the lungs daily.) 60 each 5   triamcinolone cream (KENALOG) 0.1 % Apply 1 Application topically daily as needed (irritation).     No facility-administered medications prior to visit.   Review of Systems  Review of Systems  Constitutional: Negative.  Negative for fatigue and fever.  HENT: Negative.    Respiratory:  Positive for cough. Negative for shortness of breath and wheezing.    Physical Exam  There were no vitals taken for this visit. Physical Exam Constitutional:      General: She is not in acute distress.    Appearance: Normal appearance. She is not ill-appearing.  HENT:     Head: Normocephalic and atraumatic.  Cardiovascular:     Rate and Rhythm: Normal rate and regular rhythm.  Pulmonary:     Effort: Pulmonary effort is normal. No respiratory distress.     Breath sounds: No stridor. Wheezing  present. No rhonchi or rales.  Musculoskeletal:        General: Normal range of motion.     Comments: Using WC  Neurological:      General: No focal deficit present.     Mental Status: She is alert and oriented to person, place, and time. Mental status is at baseline.  Psychiatric:        Mood and Affect: Mood normal.        Behavior: Behavior normal.        Thought Content: Thought content normal.        Judgment: Judgment normal.      Lab Results:  CBC    Component Value Date/Time   WBC 9.7 02/06/2023 0809   RBC 3.07 (L) 02/06/2023 0809   HGB 9.4 (L) 02/06/2023 0809   HGB 10.1 (L) 05/24/2021 0950   HCT 30.7 (L) 02/06/2023 0809   HCT 30.9 (L) 05/24/2021 0950   PLT 250 02/06/2023 0809   PLT 297 05/24/2021 0950   MCV 100.0 02/06/2023 0809   MCV 93 05/24/2021 0950   MCH 30.6 02/06/2023 0809   MCHC 30.6 02/06/2023 0809   RDW 15.3 02/06/2023 0809   RDW 14.4 05/24/2021 0950   LYMPHSABS 1.8 02/04/2023 1950   LYMPHSABS 1.7 05/24/2021 0950   MONOABS 1.5 (H) 02/04/2023 1950   EOSABS 0.0 02/04/2023 1950   EOSABS 0.2 05/24/2021 0950   BASOSABS 0.1 02/04/2023 1950   BASOSABS 0.0 05/24/2021 0950    BMET    Component Value Date/Time   NA 136 02/06/2023 0809   NA 142 05/24/2021 0950   K 4.0 02/06/2023 0809   CL 107 02/06/2023 0809   CO2 22 02/06/2023 0809   GLUCOSE 95 02/06/2023 0809   BUN 21 02/06/2023 0809   BUN 13 05/24/2021 0950   CREATININE 1.11 (H) 02/06/2023 0809   CREATININE 0.91 06/13/2016 1441   CALCIUM 8.6 (L) 02/06/2023 0809   GFRNONAA 55 (L) 02/06/2023 0809   GFRNONAA 70 06/13/2016 1441   GFRAA 68 04/10/2020 1418   GFRAA 80 06/13/2016 1441    BNP    Component Value Date/Time   BNP 14.4 01/30/2012 1700    ProBNP    Component Value Date/Time   PROBNP 16.0 11/18/2021 0938    Imaging: No results found.   Assessment & Plan:   1. COPD mixed type (HCC) (Primary)  2. Chronic respiratory failure with hypoxia (HCC)  3. Cigarette smoker       COPD/Chronic Bronchitis Chronic cough, occasional shortness of breath, and hoarseness. Recent exacerbation in November treated  with Cipro and prednisone. Currently stable and back to baseline. Chest X-ray in November showed no acute disease, but chronic changes of emphysema and bronchitis. She was noted to have wheezing on exam, recommend increasing Trelegy dose to one puff daily. Smoking cessation strongly encouraged. Advised she consider RSV vaccine due to severe COPD and high risk for lower respiratory infections.  Smoking Cessation Half a pack per day smoker for 45 years. Expressed desire to quit smoking, especially in light of potential future neck surgery. -Start nicotine patch 14mg  daily. -Encouraged to call 1-800-QUIT-NOW for additional support and potential for free nicotine patches. -Advised to taper down cigarette use until quit date, then use nicotine patch and refer to self as an ex-smoker.  Neck Pain Pending spinal injections at [facility] for chronic neck pain. If ineffective, potential for future spinal surgery. -Continue current pain management plan. -Consider smoking cessation prior to any potential surgery to  reduce risk of complications and improve wound healing.  Follow-up in 3-4 months with Dr. Sherene Sires to monitor COPD and overall health status.     Glenford Bayley, NP 08/06/2023

## 2023-08-06 NOTE — Patient Instructions (Addendum)
-COPD/CHRONIC BRONCHITIS: COPD and chronic bronchitis are long-term lung conditions that cause breathing difficulties. You recently had a flare-up that was treated with Cipro and prednisone, and you are now stable. Continue using your Trelegy inhaler. We also recommend considering the RSV vaccine due to your high risk for respiratory infections.  -SMOKING CESSATION: Smoking cessation is the process of quitting smoking. You have been smoking half a pack per day for 45 years and expressed a desire to quit. We will start you on a 14mg  nicotine patch daily. Please call 1-800-QUIT-NOW for additional support and possibly free nicotine patches. Gradually reduce your cigarette use until your quit date, then use the nicotine patch and consider yourself an ex-smoker.  -NECK PAIN: You have chronic neck pain and are scheduled for spinal injections at [facility]. If these injections do not help, you may need spinal surgery. Continue with your current pain management plan. Quitting smoking before any potential surgery can reduce the risk of complications and improve healing.  INSTRUCTIONS: Please follow up in 3-4 months with Dr. Sherene Sires to monitor your COPD and overall health status.  Respiratory Syncytial Virus (RSV) Vaccine Injection What is this medication? RESPIRATORY SYNCYTIAL VIRUS VACCINE (reh SPIR uh tor ee sin SISH uhl VY rus vak SEEN) reduces the risk of respiratory syncytial virus (RSV). It does not treat RSV. It is still possible to get RSV after receiving this vaccine, but the symptoms may be less severe or not last as long. It works by helping your immune system learn how to fight off a future infection. This medicine may be used for other purposes; ask your health care provider or pharmacist if you have questions. COMMON BRAND NAME(S): ABRYSVO, AREXVY What should I tell my care team before I take this medication? They need to know if you have any of these conditions: Immune system problems An  unusual or allergic reaction to respiratory syncytial virus vaccine, other medications, foods, dyes, or preservatives Pregnant or trying to get pregnant Breastfeeding How should I use this medication? This vaccine is injected into a muscle. It is given by your care team. A copy of Vaccine Information Statements will be given before each vaccination. Be sure to read this information carefully each time. This sheet may change often. A copy of Vaccine Information Statements will be given before each vaccination. Be sure to read this information carefully each time. This sheet may change often. Talk to your care team about the use of this vaccine in children. It is not approved for use in children. Overdosage: If you think you have taken too much of this medicine contact a poison control center or emergency room at once. NOTE: This medicine is only for you. Do not share this medicine with others. What if I miss a dose? This does not apply. What may interact with this medication? Medications that lower your chance of fighting infection This list may not describe all possible interactions. Give your health care provider a list of all the medicines, herbs, non-prescription drugs, or dietary supplements you use. Also tell them if you smoke, drink alcohol, or use illegal drugs. Some items may interact with your medicine. What should I watch for while using this medication? Visit your care team for regular health checks. Before you receive this vaccine, talk to your care team if you have an acute illness. Vaccines can be given to people with mild acute illness, such as the common cold or diarrhea. Discuss with your care team the risks and benefits of  receiving this vaccine during a moderate to severe illness. Your care team may choose to wait to give you the vaccine when you feel better. Report any side effects to your care team or to the Vaccine Adverse Event Reporting System (VAERS) website at  https://vaers.LAgents.no. This is only for reporting side effects; VAERs staff do not give medical advice. What side effects may I notice from receiving this medication? Side effects that you should report to your care team as soon as possible: Allergic reactions--skin rash, itching, hives, swelling of the face, lips, tongue, or throat Side effects that usually do not require medical attention (report these to your care team if they continue or are bothersome): Fatigue Feeling faint or lightheaded Headache Joint pain Muscle pain Pain, redness, or irritation at injection site This list may not describe all possible side effects. Call your doctor for medical advice about side effects. You may report side effects to FDA at 1-800-FDA-1088. Where should I keep my medication? This vaccine is only given by your care team. It will not be stored at home. NOTE: This sheet is a summary. It may not cover all possible information. If you have questions about this medicine, talk to your doctor, pharmacist, or health care provider.  2024 Elsevier/Gold Standard (2022-07-31 00:00:00)

## 2023-08-07 NOTE — Telephone Encounter (Signed)
[name], regarding risk assessment, does this need to wait until her next visit with Dr Sherene Sires? She was seen 08/06/23 and I am unsure if able to be cleared due to her current smoking status. Thanks!

## 2023-08-10 ENCOUNTER — Ambulatory Visit (HOSPITAL_COMMUNITY)
Admission: RE | Admit: 2023-08-10 | Discharge: 2023-08-10 | Disposition: A | Discharge status: Home / Self Care | Payer: 59 | Source: Ambulatory Visit | Attending: Nurse Practitioner | Admitting: Nurse Practitioner

## 2023-08-10 DIAGNOSIS — R946 Abnormal results of thyroid function studies: Secondary | ICD-10-CM | POA: Diagnosis present

## 2023-08-10 NOTE — Telephone Encounter (Signed)
Pt states levETIRAcetam (KEPPRA) 250 MG tablet is giving her the same side effects as the other medications she stopped taking. Requesting call back

## 2023-08-11 LAB — T4, FREE: Free T4: 1.05 ng/dL (ref 0.82–1.77)

## 2023-08-11 LAB — T3, FREE: T3, Free: 2.6 pg/mL (ref 2.0–4.4)

## 2023-08-11 LAB — TSH: TSH: 1.48 u[IU]/mL (ref 0.450–4.500)

## 2023-08-11 LAB — THYROID PEROXIDASE ANTIBODY: Thyroperoxidase Ab SerPl-aCnc: 9 [IU]/mL (ref 0–34)

## 2023-08-11 LAB — THYROGLOBULIN ANTIBODY: Thyroglobulin Antibody: <1 [IU]/mL (ref 0.0–0.9)

## 2023-08-11 NOTE — Telephone Encounter (Signed)
She told me she was to try spinal injections first, surgery date has not yet been set.  If its within 90 days I can do risk assessment.

## 2023-08-13 ENCOUNTER — Telehealth: Payer: Self-pay | Admitting: Primary Care

## 2023-08-13 NOTE — Telephone Encounter (Signed)
 Will f/u later with the pt to see if surgery clearance is needed.

## 2023-08-13 NOTE — Telephone Encounter (Signed)
 Patient has a cough and shortness of breath.She is also experiencing a headache  She is taking mussinex and would like for some prednisone to be called in.   Pharmacy: [facility] in [place]

## 2023-08-13 NOTE — Telephone Encounter (Signed)
 Prednisone 10 mg take  4 each am x 2 days,   2 each am x 2 days,  1 each am x 2 days and stop   Note allergies to most abx so if not doing better on this rx needs to be seen in UC or ER as too risky to treat over the phone

## 2023-08-13 NOTE — Telephone Encounter (Signed)
 Called and spoke with the pt  She states was exposed to someone with RSV recently  She has had increased cough and wheezing x 2 days  She has prod cough with green sputum  Denies any fevers, aches  She is requesting pred  She is taking her trelegy and albuterol  inhaler and nebs  Please advise, thanks!

## 2023-08-14 MED ORDER — PREDNISONE 10 MG PO TABS: ORAL_TABLET | ORAL | 0 refills | Status: DC

## 2023-08-14 NOTE — Telephone Encounter (Signed)
 Patient states someone from our office called her but hung up before she could answer the phone---[phone]

## 2023-08-14 NOTE — Telephone Encounter (Signed)
 Called and spoke with pt about pred that has been sent into pharmacy. Went over Dr. Sherene Sires recommendations, pt verbalized understanding nfn

## 2023-08-17 NOTE — Telephone Encounter (Signed)
Call to patient, no answer, left message to return call.

## 2023-08-18 NOTE — Telephone Encounter (Signed)
Pt returning call. Requesting call back.  

## 2023-08-18 NOTE — Telephone Encounter (Signed)
 Continue Topiramate for now.

## 2023-08-18 NOTE — Telephone Encounter (Signed)
 Call to patient who reports stopping the Keppra  1 week after taking it. She reports she had a staring seizure on 12/18 while on Keppra  and the medication was making her to where she could not walk and take care of her self. Advised that adding new medications take an adjustment periods and she stated she just couldn't function. She reports only taking Topamax  200 mg twice daily. She is open to adding another medication or staying on the Topamax .Advised I would send to Dr. Gregg for review.

## 2023-08-18 NOTE — Addendum Note (Signed)
 Addended by: Karma Lew L on: 08/18/2023 10:20 AM   Modules accepted: Orders

## 2023-08-20 ENCOUNTER — Ambulatory Visit (INDEPENDENT_AMBULATORY_CARE_PROVIDER_SITE_OTHER): Payer: 59 | Admitting: Nurse Practitioner

## 2023-08-20 ENCOUNTER — Ambulatory Visit: Payer: 59 | Admitting: Nurse Practitioner

## 2023-08-20 ENCOUNTER — Encounter: Payer: Self-pay | Admitting: Nurse Practitioner

## 2023-08-20 VITALS — BP 93/61 | HR 89 | Ht 60.0 in | Wt 180.0 lb

## 2023-08-20 DIAGNOSIS — R946 Abnormal results of thyroid function studies: Secondary | ICD-10-CM | POA: Diagnosis not present

## 2023-08-20 NOTE — Progress Notes (Signed)
 08/20/2023     Endocrinology Consult Note    Subjective:    Patient ID: [name], female    DOB: 1956/06/27, PCP Emmitt Geofm BROCKS, MD.   Past Medical History:  Diagnosis Date   Allergic rhinitis    Anxiety    Arthritis    hands and legs and feet (04/27/2012)   Asthma    Cancer (HCC) 2005   rt arm mole rem cancer   CHF (congestive heart failure) (HCC)    Chronic bronchitis (HCC)    keep it all the time (04/27/2012)   Chronic kidney disease    Chronic kidney disease (CKD)    Stage III   COPD (chronic obstructive pulmonary disease) (HCC)    COVID-19 virus infection    03-2021   Depression    PT DENIES   Diverticulosis    DJD (degenerative joint disease)    Emphysema    Epilepsy (HCC)    Epilepsy (HCC)    Esophageal stricture    Exertional dyspnea    GERD (gastroesophageal reflux disease)    Glaucoma    H/O blood clots    Right leg   Heart failure (HCC)    History of COVID-19 03/2021   History of kidney stones    History of nuclear stress test    Myoview  12/25/22: EF 74, no ischemia or infarction, low risk   Hyperlipidemia    Hypertension    IBS (irritable bowel syndrome)    Migraine    Neck pain    Cervical disc   OSA on CPAP    6-7 yrs; pt wears CPAP but has not been able to use it lately due to sinus issues   Oxygen  deficiency    USES 3 LITERS 02 CONTINOUS   Pneumonia 2012; 04/27/2012   Recurrent upper respiratory infection (URI)    Right leg DVT (HCC) 1982   groin   Seizures (HCC)    LAST SEIZURE 6 MONTHS OR  LONGER   Sleep apnea    Type II diabetes mellitus (HCC)    No meds since weight loss   UTI (urinary tract infection)    2 weeks ago   Vertigo 10/24/2014    Past Surgical History:  Procedure Laterality Date   ABDOMINAL HYSTERECTOMY  2001   partial   BIOPSY  04/16/2021   Procedure: BIOPSY;  Surgeon: Shila Gustav GAILS, MD;  Location: [facility];  Service: Endoscopy;;   CARPAL TUNNEL RELEASE  ~ 2008   right   CARPAL  TUNNEL RELEASE Right    CATARACT EXTRACTION Bilateral    COLONOSCOPY     COLONOSCOPY WITH ESOPHAGOGASTRODUODENOSCOPY (EGD)     CYSTOSCOPY/URETEROSCOPY/HOLMIUM LASER/STENT PLACEMENT Left 01/27/2023   Procedure: CYSTOSCOPY LEFT RETROGRADE PYELOGRAM, LEFT URETEROSCOPY, HOLMIUM LASER LITHOTRIPSY, AND LEFT URETERAL STENT PLACEMENT;  Surgeon: Watt Rush, MD;  Location: [facility];  Service: Urology;  Laterality: Left;  60 MINUTES   ESOPHAGOGASTRODUODENOSCOPY (EGD) WITH PROPOFOL  N/A 04/16/2021   Procedure: ESOPHAGOGASTRODUODENOSCOPY (EGD) WITH PROPOFOL ;  Surgeon: Shila Gustav GAILS, MD;  Location: [facility];  Service: Endoscopy;  Laterality: N/A;   KNEE ARTHROSCOPY  1990's   left   NASAL SEPTOPLASTY W/ TURBINOPLASTY Bilateral 05/23/2016   Procedure: NASAL SEPTOPLASTY WITH TURBINATE REDUCTION;  Surgeon: Alm Bouche, MD;  Location: [facility] OR;  Service: ENT;  Laterality: Bilateral;   SHOULDER OPEN ROTATOR CUFF REPAIR  ~ 2010   left   SINOSCOPY     SINUS ENDO WITH FUSION Right 05/23/2016   Procedure: LIMITED  RIGHT ENDOSCOPIC SINUS SURGERY;  Surgeon: Alm Bouche, MD;  Location: [facility] OR;  Service: ENT;  Laterality: Right;   TOTAL KNEE ARTHROPLASTY  08/23/2012   right   TOTAL KNEE ARTHROPLASTY  08/23/2012   Procedure: TOTAL KNEE ARTHROPLASTY;  Surgeon: Lamar DELENA Millman, MD;  Location: [facility] OR;  Service: Orthopedics;  Laterality: Right;  RIGHT KNEE ARTHROPLASTY MEDIAL AND LATERAL COMPARTMENTS WITH PATELLA RESURFACING   TUBAL LIGATION  2001   UPPER GASTROINTESTINAL ENDOSCOPY      Social History   Socioeconomic History   Marital status: Married    Spouse name: Gracelyn   Number of children: 2   Years of education: 12   Highest education level: Not on file  Occupational History   Occupation: disabled  Tobacco Use   Smoking status: Every Day    Current packs/day: 0.00    Average packs/day: 0.5 packs/day for 45.4 years (22.7 ttl pk-yrs)    Types: Cigarettes    Start date: 08/12/1975    Last attempt to  quit: 12/30/2020    Years since quitting: 2.6   Smokeless tobacco: Never   Tobacco comments:    Smoking 6 per day.  Trying to quit.  01/13/2023  Vaping Use   Vaping status: Never Used  Substance and Sexual Activity   Alcohol use: No    Alcohol/week: 0.0 standard drinks of alcohol   Drug use: No   Sexual activity: Never  Other Topics Concern   Not on file  Social History Narrative   Patient lives at home with husband Gracelyn, her son and his wife.    Patient has 2 children.    Patient has a high school education.    Patient is currently unemployed.    Patient is right handed.    Patient drinks 2 cups of caffeine per day.   Social Drivers of Corporate Investment Banker Strain: Low Risk  (05/06/2023)   Received from [facility]   Overall Financial Resource Strain (CARDIA)    Difficulty of Paying Living Expenses: Not hard at all  Food Insecurity: No Food Insecurity (05/06/2023)   Received from [facility]   Hunger Vital Sign    Worried About Running Out of Food in the Last Year: Never true    Ran Out of Food in the Last Year: Never true  Transportation Needs: No Transportation Needs (05/06/2023)   Received from [facility] - Transportation    Lack of Transportation (Medical): No    Lack of Transportation (Non-Medical): No  Physical Activity: Inactive (05/06/2023)   Received from [facility]   Exercise Vital Sign    Days of Exercise per Week: 0 days    Minutes of Exercise per Session: 0 min  Stress: No Stress Concern Present (05/06/2023)   Received from [facility] - Occupational Stress Questionnaire    Feeling of Stress : Not at all  Social Connections: Socially Integrated (05/06/2023)   Received from [facility]   Social Connection and Isolation Panel [NHANES]    Frequency of Communication with Friends and Family: More than three times a week    Frequency of Social Gatherings with Friends and Family:  Three times a week    Attends Religious Services: 1 to 4 times per year    Active Member of Clubs or Organizations: No    Attends Banker Meetings: 1 to 4 times per year    Marital  Status: Married    Family History  Problem Relation Age of Onset   Diabetes Mother    Lung cancer Mother        was a smoker   Stroke Mother    Alcohol abuse Father    Lung cancer Father        was a smoker   Emphysema Father        was a smoker   Heart disease Sister    Other Sister        blood clotting disorder   Diabetes Brother    Seizures Brother    Diabetes Maternal Aunt    Breast cancer Maternal Aunt    Esophageal cancer Maternal Uncle    Diabetes Maternal Uncle    Heart disease Maternal Grandfather    Diabetes Maternal Grandfather    Bipolar disorder Son    Muscular dystrophy Son    Heart disease Son    Heart disease Other        uncle   Colon cancer Neg Hx    Pancreatic cancer Neg Hx    Stomach cancer Neg Hx    Colon polyps Neg Hx    Rectal cancer Neg Hx     Outpatient Encounter Medications as of 08/20/2023  Medication Sig   albuterol  (PROAIR  HFA) 108 (90 Base) MCG/ACT inhaler INHALE TWO PUFFS INTO THE LUNGS EVERY 6 HOURS AS NEEDED FOR SHORTNESS OF BREATH (Patient taking differently: Inhale 2 puffs into the lungs every 6 (six) hours as needed for shortness of breath or wheezing.)   albuterol  (PROVENTIL ) (2.5 MG/3ML) 0.083% nebulizer solution USE 1 VIAL IN NEBULIZER EVERY 6 HOURS AS NEEDED FOR SHORTNESS OF BREATH (Patient taking differently: Take 2.5 mg by nebulization every 6 (six) hours as needed for wheezing or shortness of breath.)   allopurinol (ZYLOPRIM) 100 MG tablet Take 100 mg by mouth daily.   ALPRAZolam  (XANAX ) 0.25 MG tablet Take 0.25 mg by mouth 2 (two) times daily.   Azelastine  HCl (ASTEPRO ) 0.15 % SOLN Place 1 spray into the nose 2 (two) times daily as needed (Allergies).   Cholecalciferol  (VITAMIN D3) 50 MCG (2000 UT) capsule TAKE ONE CAPSULE BY MOUTH  DAILY (Patient taking differently: Take 2,000 Units by mouth daily.)   famotidine  (PEPCID ) 40 MG tablet Take 40 mg by mouth at bedtime.   Fluticasone -Umeclidin-Vilant (TRELEGY ELLIPTA ) 200-62.5-25 MCG/ACT AEPB Inhale 1 puff into the lungs daily.   Fluticasone -Umeclidin-Vilant (TRELEGY ELLIPTA ) 200-62.5-25 MCG/ACT AEPB Inhale 1 puff into the lungs daily.   gabapentin  (NEURONTIN ) 100 MG capsule Take 1 capsule (100 mg total) by mouth at bedtime. (Patient taking differently: Take 100 mg by mouth 3 (three) times daily.)   guaiFENesin  (MUCINEX ) 600 MG 12 hr tablet Take 2 tablets (1,200 mg total) by mouth 2 (two) times daily as needed. (Patient taking differently: Take 1,200 mg by mouth 2 (two) times daily as needed for cough or to loosen phlegm.)   ipratropium-albuterol  (DUONEB) 0.5-2.5 (3) MG/3ML SOLN Take 3 mLs by nebulization every 6 (six) hours as needed (Wheezing/sob).   loratadine  (ALLERGY RELIEF) 10 MG tablet Take 1 tablet (10 mg total) by mouth daily.   montelukast  (SINGULAIR ) 10 MG tablet TAKE ONE TABLET BY MOUTH AT BEDTIME   nicotine  (NICODERM CQ  - DOSED IN MG/24 HOURS) 14 mg/24hr patch Place 1 patch (14 mg total) onto the skin daily.   ondansetron  (ZOFRAN ) 4 MG tablet Take 1 tablet (4 mg total) by mouth daily as needed for nausea or vomiting.   oxyCODONE -acetaminophen  (  PERCOCET/ROXICET) 5-325 MG tablet Take 1 tablet by mouth every 6 (six) hours as needed for severe pain.   OXYGEN  Inhale 3 L into the lungs continuous.   pantoprazole  (PROTONIX ) 40 MG tablet Take 1 tablet (40 mg total) by mouth 2 (two) times daily. **PLEASE CALL OFFICE TO SCHEDULE FOLLOW UP   QUEtiapine  (SEROQUEL ) 100 MG tablet TAKE ONE TABLET BY MOUTH AT BEDTIME   simvastatin  (ZOCOR ) 40 MG tablet TAKE ONE TABLET BY MOUTH AT BEDTIME (Patient taking differently: Take 40 mg by mouth daily at 6 PM.)   topiramate  (TOPAMAX ) 100 MG tablet TAKE TWO TABLETS BY MOUTH TWICE DAILY   traMADol  (ULTRAM ) 50 MG tablet Take 1 tablet (50 mg total)  by mouth every 6 (six) hours as needed.   triamcinolone  cream (KENALOG ) 0.1 % Apply 1 Application topically daily as needed (irritation).   predniSONE  (DELTASONE ) 10 MG tablet Take 4 tabs for 2 days, then 3 tabs for 2 days, 2 tabs for 2 days, then 1 tab for 2 days, then stop. (Patient not taking: Reported on 08/20/2023)   No facility-administered encounter medications on file as of 08/20/2023.    ALLERGIES: Allergies  Allergen Reactions   Amoxicillin Anaphylaxis    Breakout, mouth swells   Azithromycin Swelling and Rash    swelling all over, hives, thrush   Cefdinir      Throat swelling after 3rd dose   Chantix  [Varenicline ] Other (See Comments)    bad dreams, difficulty breathing   Clindamycin  Anaphylaxis   Doxycycline  Hyclate Anaphylaxis and Rash    swelling   Levofloxacin  Anaphylaxis    Pt can take moxifloxacin    Penicillins Anaphylaxis, Shortness Of Breath and Rash    ALMOST DIED, ended up in hospital    Sulfonamide Derivatives Shortness Of Breath, Swelling and Rash    break out from head to toe   Toradol  [Ketorolac  Tromethamine ] Other (See Comments)    Seizure    Clarithromycin Hives    swelling   Nortriptyline Swelling, Rash and Other (See Comments)    Per patient had kidney problems on this medication, could not use bathroom (urinary retention)   Paxlovid  [Nirmatrelvir -Ritonavir ] Swelling    Throat swelling   Codeine  Nausea And Vomiting   Fluticasone -Salmeterol Other (See Comments)    REACTION: ulcers in mouth   Triptans Rash    Mouth breaks out and start itching    VACCINATION STATUS: Immunization History  Administered Date(s) Administered   Influenza Split 05/22/2011, 05/05/2012   Influenza Whole 05/27/2007, 05/01/2008, 05/22/2009, 05/23/2010   Influenza, High Dose Seasonal PF 05/06/2023   Influenza,inj,Quad PF,6+ Mos 05/10/2013, 04/19/2014, 06/25/2015, 04/15/2016, 05/12/2017, 05/11/2019, 05/16/2020, 05/15/2021   PNEUMOCOCCAL CONJUGATE-20 07/02/2021    Pneumococcal Polysaccharide-23 08/12/1999, 04/19/2014, 06/11/2021   Td 12/09/2001   Tdap 01/05/2013     HPI  [name] is 68 y.o. female who presents today with a medical history as above, accompanied by her grandson. she is being seen in consultation for abnormal thyroid  function (concerns for central hypothyroidism) requested by Emmitt Geofm BROCKS, MD.  she has been dealing with symptoms of fatigue, forgetfulness, generalized joint aches/pains, constipation, weight gain, cold intolerance, and headaches intermittently since end of 2021 beginning of 2022. These symptoms are progressively worsening and troubling to her.  her most recent thyroid  labs revealed normal TSH of 1.297 and low FT4 of 0.64 on 05/06/23.  she does have problems swallowing, had barium swallow showing some penetration but no aspiration.   She does have trouble breathing related to her COPD.  she does not have known family history of thyroid  dysfunction and denies family hx of thyroid  cancer. she denies personal history of goiter. she is not on any anti-thyroid  medications nor on any thyroid  hormone supplements. Denies use of Biotin containing supplements.  she is willing to proceed with appropriate work up.  She intermittently uses steroids to help when she has COPD exacerbation which could contribute to some abnormal thyroid  labs.  She also notes she has had pneumonia several times in the last year, was hospitalized for this as well as sepsis secondary to UTI.    She has never had any radiation to her head or neck in the past.  She has had CT and MRI of her head in the past and no obvious problems with her pituitary gland were identified at that time.   Review of systems  Constitutional: + stable body weight, current Body mass index is 35.15 kg/m., + fatigue, no subjective hyperthermia, + subjective hypothermia, + intermittent headaches Eyes: no blurry vision, no xerophthalmia ENT: no sore throat, no nodules palpated in  throat, + intermittent dysphagia/odynophagia, + hoarseness Cardiovascular: no chest pain, + shortness of breath (Hx COPD), no palpitations, no leg swelling Respiratory: no cough, + shortness of breath (Hx COPD) Gastrointestinal: no nausea/vomiting/diarrhea, + chronic constipation Musculoskeletal: + diffuse chronic muscle/joint aches Skin: no rashes, no hyperemia Neurological: no tremors, no numbness, no tingling, no dizziness, + hx seizures  Psychiatric: no depression, no anxiety   Objective:    BP 93/61 (BP Location: Left Arm, Patient Position: Sitting, Cuff Size: Large)   Pulse 89   Ht 5' (1.524 m)   Wt 180 lb (81.6 kg) Comment: per patient this was her weight 2 days ago- in wheelchair  BMI 35.15 kg/m   Wt Readings from Last 3 Encounters:  08/20/23 180 lb (81.6 kg)  08/06/23 182 lb 12.8 oz (82.9 kg)  07/13/23 180 lb 3.2 oz (81.7 kg)     BP Readings from Last 3 Encounters:  08/20/23 93/61  08/06/23 120/64  07/13/23 112/65                        Physical Exam- Limited  Constitutional:  Body mass index is 35.15 kg/m. , not in acute distress, normal state of mind Eyes:  EOMI, no exophthalmos Musculoskeletal: no gross deformities, strength intact in all four extremities, no gross restriction of joint movements Skin:  no rashes, no hyperemia Neurological: no tremor with outstretched hands   CMP     Component Value Date/Time   NA 136 02/06/2023 0809   NA 142 05/24/2021 0950   K 4.0 02/06/2023 0809   CL 107 02/06/2023 0809   CO2 22 02/06/2023 0809   GLUCOSE 95 02/06/2023 0809   BUN 21 02/06/2023 0809   BUN 13 05/24/2021 0950   CREATININE 1.11 (H) 02/06/2023 0809   CREATININE 0.91 06/13/2016 1441   CALCIUM  8.6 (L) 02/06/2023 0809   PROT 7.0 02/05/2023 0518   PROT 7.2 08/21/2021 1120   ALBUMIN 2.7 (L) 02/05/2023 0518   ALBUMIN 4.1 08/21/2021 1120   AST 12 (L) 02/05/2023 0518   ALT 10 02/05/2023 0518   ALKPHOS 85 02/05/2023 0518   BILITOT 0.5 02/05/2023 0518    BILITOT <0.2 08/21/2021 1120   GFR 59.31 (L) 11/28/2022 1508   EGFR 48 (L) 05/24/2021 0950   GFRNONAA 55 (L) 02/06/2023 0809   GFRNONAA 70 06/13/2016 1441     CBC    Component Value Date/Time  WBC 9.7 02/06/2023 0809   RBC 3.07 (L) 02/06/2023 0809   HGB 9.4 (L) 02/06/2023 0809   HGB 10.1 (L) 05/24/2021 0950   HCT 30.7 (L) 02/06/2023 0809   HCT 30.9 (L) 05/24/2021 0950   PLT 250 02/06/2023 0809   PLT 297 05/24/2021 0950   MCV 100.0 02/06/2023 0809   MCV 93 05/24/2021 0950   MCH 30.6 02/06/2023 0809   MCHC 30.6 02/06/2023 0809   RDW 15.3 02/06/2023 0809   RDW 14.4 05/24/2021 0950   LYMPHSABS 1.8 02/04/2023 1950   LYMPHSABS 1.7 05/24/2021 0950   MONOABS 1.5 (H) 02/04/2023 1950   EOSABS 0.0 02/04/2023 1950   EOSABS 0.2 05/24/2021 0950   BASOSABS 0.1 02/04/2023 1950   BASOSABS 0.0 05/24/2021 0950     Diabetic Labs (most recent): Lab Results  Component Value Date   HGBA1C 5.7 (H) 12/24/2022   HGBA1C 5.5 05/16/2020   HGBA1C 5.6 11/30/2019    Lipid Panel     Component Value Date/Time   CHOL 184 08/21/2021 1120   TRIG 106 08/21/2021 1120   HDL 91 08/21/2021 1120   CHOLHDL 2.0 08/21/2021 1120   CHOLHDL 2.8 06/19/2014 1656   VLDL 35 06/19/2014 1656   LDLCALC 75 08/21/2021 1120   LDLDIRECT 77 09/14/2012 1618   LABVLDL 18 08/21/2021 1120   Thyroid  ultrasound from 08/10/23  CLINICAL DATA:  Other.  Dysphagia, abnormal thyroid  function studies   EXAM: THYROID  ULTRASOUND   TECHNIQUE: Ultrasound examination of the thyroid  gland and adjacent soft tissues was performed.   COMPARISON:  None Available.   FINDINGS: Parenchymal Echotexture: Mildly heterogenous   Isthmus: 0.3 cm   Right lobe: 4.0 x 1.9 x 1.9 cm   Left lobe: 3.7 x 2.1 x 1.4 cm   _________________________________________________________   Estimated total number of nodules >/= 1 cm: 0   Number of spongiform nodules >/=  2 cm not described below (TR1): 0   Number of mixed cystic and solid  nodules >/= 1.5 cm not described below (TR2): 0   _________________________________________________________   No discrete nodules of consequence are seen within the thyroid  gland. Solitary subcentimeter nodule noted incidentally in the right upper gland. This lesion does not meet criteria to recommend further imaging surveillance or other evaluation.   Normal vascularity on color Doppler imaging.   IMPRESSION: Mildly heterogeneous but otherwise unremarkable thyroid  gland.   The above is in keeping with the ACR TI-RADS recommendations - J Am Coll Radiol 2017;14:587-595.     Electronically Signed   By: Wilkie Lent M.D.   On: 08/15/2023 07:25     Latest Reference Range & Units 03/27/10 21:41 01/30/12 17:00 08/07/13 09:50 06/19/14 16:56 05/06/23 00:00 08/10/23 11:33  TSH 0.450 - 4.500 uIU/mL 1.605 2.473 0.719 2.314 1.30 (E) 1.480  Triiodothyronine,Free,Serum 2.0 - 4.4 pg/mL      2.6  T4,Free(Direct) 0.82 - 1.77 ng/dL      8.94  Thyroperoxidase Ab SerPl-aCnc 0 - 34 IU/mL      9  Thyroglobulin Antibody 0.0 - 0.9 IU/mL      <1.0  (E): External lab result    Assessment & Plan:   1. Abnormal thyroid  function test-resolved  she is being seen at a kind request of Emmitt Geofm BROCKS, MD.  Her thyroid  antibody testing was negative, ruling out autoimmune thyroid  dysfunction.  Her labs show euthyroid presentation.  This is suggestive of acute thyroiditis as an explanation of her recent abnormalities vs chronic intermittent steroid use for COPD exacerbations which  can throw off thyroid  labs as well.  Will plan to recheck thyroid  labs again in 4 months for surveillance.  Her thyroid  ultrasound shows mild heterogenicity with no nodules and of average size.  This rules out the thyroid  as potential cause of her hoarseness and trouble swallowing.  She does not require any additional imaging.      -Patient is advised to maintain close follow up with Emmitt Geofm BROCKS, MD for primary  care needs.    I spent  19  minutes in the care of the patient today including review of labs from Thyroid  Function, CMP, and other relevant labs ; imaging/biopsy records (current and previous including abstractions from other facilities); face-to-face time discussing  her lab results and symptoms, medications doses, her options of short and long term treatment based on the latest standards of care / guidelines;   and documenting the encounter.  [name]  participated in the discussions, expressed understanding, and voiced agreement with the above plans.  All questions were answered to her satisfaction. she is encouraged to contact clinic should she have any questions or concerns prior to her return visit.  Follow up plan: Return in about 4 months (around 12/18/2023) for Thyroid  follow up, Previsit labs.   Thank you for involving me in the care of this pleasant patient, and I will continue to update you with her progress.    Benton Rio, [facility] [address] [place], [place] [address] Phone: [phone] Fax: [phone]  08/20/2023, 1:20 PM

## 2023-08-23 ENCOUNTER — Other Ambulatory Visit: Payer: Self-pay | Admitting: Neurology

## 2023-09-09 ENCOUNTER — Telehealth: Payer: Self-pay | Admitting: Internal Medicine

## 2023-09-09 NOTE — Telephone Encounter (Signed)
Called and spoke with patient she stated that she is still having sob  with coughing , she was given prednisone about 3 weeks  has finished taking it, she also has been doing breathing treatment 3 times a day and using her inhaler. And for cough she is taking otc mucinex. Pt  would like around prednisone.

## 2023-09-09 NOTE — Telephone Encounter (Signed)
Called spoke with patient made an appt for patient to come at 330pm on 09/10/23

## 2023-09-09 NOTE — Telephone Encounter (Signed)
Patient states she has been short of breath for the past couple of days and would like for Dr. Sherene Sires to call in Prednisone for her to [facility] [place]-----patent call back is [phone]

## 2023-09-09 NOTE — Telephone Encounter (Signed)
Needs ov by weekend with all meds in hand - ok to add on to afternoon 09/10/23

## 2023-09-10 ENCOUNTER — Encounter: Payer: Self-pay | Admitting: Internal Medicine

## 2023-09-10 ENCOUNTER — Ambulatory Visit: Payer: 59 | Admitting: Internal Medicine

## 2023-09-10 VITALS — BP 103/70 | HR 83 | Ht 60.0 in | Wt 185.0 lb

## 2023-09-10 DIAGNOSIS — F17210 Nicotine dependence, cigarettes, uncomplicated: Secondary | ICD-10-CM | POA: Diagnosis not present

## 2023-09-10 DIAGNOSIS — J449 Chronic obstructive pulmonary disease, unspecified: Secondary | ICD-10-CM

## 2023-09-10 DIAGNOSIS — J9611 Chronic respiratory failure with hypoxia: Secondary | ICD-10-CM | POA: Diagnosis not present

## 2023-09-10 MED ORDER — CIPROFLOXACIN HCL 500 MG PO TABS: 500.0000 mg | ORAL_TABLET | Freq: Two times a day (BID) | ORAL | 0 refills | Status: DC

## 2023-09-10 MED ORDER — PREDNISONE 10 MG PO TABS: ORAL_TABLET | ORAL | 0 refills | Status: DC

## 2023-09-10 NOTE — Assessment & Plan Note (Addendum)
Active smoker  -  PFT's  09/27/21  FEV1 1.05 (47 % ) ratio 0.58  p 5 % improvement from saba p ? 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

## 2023-09-10 NOTE — Patient Instructions (Signed)
Cipro 500 mg twice daily x  10days   Prednisone 10 mg take  4 each am x 2 days,   2 each am x 2 days,  1 each am x 2 days and stop   Plan A = Automatic = Always=    Trelegy 100   Plan B = Backup (to supplement plan A, not to replace it) Only use your albuterol inhaler as a rescue medication to be used if you can't catch your breath by resting or doing a relaxed purse lip breathing pattern.  - The less you use it, the better it will work when you need it. - Ok to use the inhaler up to 2 puffs  every 4 hours if you must but call for appointment if use goes up over your usual need - Don't leave home without it !!  (think of it like the spare tire for your car)   Plan C = Crisis (instead of Plan B but only if Plan B stops working) - only use your albuterol nebulizer if you first try Plan B and it fails to help > ok to use the nebulizer up to every 4 hours but if start needing it regularly call for immediate appointment  Make sure you check your oxygen saturation  AT  your highest level of activity (not after you stop)   to be sure it stays over 90% and adjust  02 flow upward to maintain this level if needed but remember to turn it back to previous settings when you stop (to conserve your supply).    You need to be off cigarettes x 2 weeks and cxr needs to be cleared before I can clear you surgery    Please schedule a follow up office visit in 6 weeks, call sooner if needed  - to see nurse practitioner

## 2023-09-10 NOTE — Assessment & Plan Note (Signed)
Active smoker  - as of 09/25/22 on 3lpm 247 with nl HC03 but hct 28%     Lab Results  Component Value Date   HGB 9.4 (L) 02/06/2023   HGB 9.2 (L) 02/05/2023   HGB 10.2 (L) 02/04/2023   HGB 10.1 (L) 05/24/2021   HGB 11.6 04/10/2020   HGB 11.8 04/05/2019     Well compensated despte obvious aecopd          Each maintenance medication was reviewed in detail including emphasizing most importantly the difference between maintenance and prns and under what circumstances the prns are to be triggered using an action plan format where appropriate.  Total time for H and P, chart review, counseling, reviewing hfa/neb/02/ flutter/pulse ox  device(s) and generating customized AVS unique to this office visit / same day charting > 30 min acute eval

## 2023-09-10 NOTE — Assessment & Plan Note (Signed)
Counseled re importance of smoking cessation but did not meet time criteria for separate billing   Low-dose CT lung cancer screening is recommended for patients who are 71-66 years of age with a 20+ pack-year history of smoking and who are currently smoking or quit <=15 years ago. No coughing up blood  No unintentional weight loss of > 15 pounds in the last 6 months - pt is eligible for scanning yearly until 15 y p quits  > referred

## 2023-09-10 NOTE — Progress Notes (Signed)
Subjective:   Patient ID: [name], female    DOB: [birth year] MRN: [id number]  HPI  57  yowf active smoker with dx of copd aware of sob around [facility] with exacerbation every few months just disharged from [facility] in 04/2012 for ? CAP with aecopd referred by Dr Wyline Mood for preop for R TKR Jan 13 14   06/18/2012 Pulmonary preop cc doe x one aisle and since last flare using inhaler saba maybe twice weekly and sleeping ok on cpap maintained on spiriva and can't take advair due to mouth irritation.  Min am cough/ congestion with thick white mucus.  rec Try to minimize smoking Try tudorza one puff twice daily instead of spiriva on trial basis for 2 weeks - call if you like it better and we'll send you a prescription       05/12/2016  f/u ov/[name] re:  preop eval for sinus surgery / no med calendar  Chief Complaint  Patient presents with   Follow-up    Pt. says she is not having any sob, coughing with green phlegm, no chest pain  very poor insight into meds / still smoking/ main problem is am cough/ congestion not using mucinex / flutter as rec  Doe = MMRC2 = can't walk a nl pace on a flat grade s sob but does fine slow and flat eg shopping Rec  Work on inhaler technique:  relax and gently blow all the way out then take a nice smooth deep breath back in, triggering the inhaler at same time you start breathing in.  Hold for up to 5 seconds if you can. Blow out thru nose. Rinse and gargle with water when done Plan A = Automatic = dulera 200 Take 2 puffs first thing in am and then another 2 puffs about 12 hours later.  Plan B = Backup Only use your albuterol (proair) as a rescue medication  Plan C = Crisis - only use your albuterol nebulizer if you first try Plan B and it fails to help > ok to use the nebulizer up to every 4 hours but if start needing it regularly call for immediate appointment Pulmonary follow up is as needed - bring your medications with you    [facility] Admit: Admit  date: 09/18/2022 Discharge date: 09/21/2022 Pneumonia of both lungs due to infectious organism COPD (chronic obstructive pulmonary disease) (CMS-HCC) Chronic diastolic heart failure (CMS-HCC) Pulmonary hypertension (CMS-HCC) HLD (hyperlipidemia) Seizure disorder (CMS-HCC) Altered mental status Tobacco dependence 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 female with PMHx as reviewed in the EMR that presented to [facility] and is being admitted for Pneumonia of both lungs due to infectious organism. Patient brought to the ED by EMS today after being found unresponsive by her family. Complains of cough and shortness of breath. On arrival patient's pO2 was 49. Patient also notes she has been having difficulty urinating the past couple of days with scant urine. Wears oxygen at home, 3 L. CPAP at night.Past medical history includes COPD, diastolic heart failure, "bad knees", chronic neck and back pain, home oxygen use, GERD, seizure disorder, hyperlipidemia. [facility] is [name]. She was recently hospitalized at  [facility] 12/26 through 12/29 with pneumonia. Today's findings include elevated WBC 19.2, creatinine 1.23, pO2 49. Chest x-ray  c/w chronic pulmonary infiltrates, unchanged. CTA of the chest shows multifocal alveolar process likely infectious. Larger area of alveolar consolidation in the left upper lobe. Nonspecific hilar and mediastinal adenopathy. Viral testing negative. Temperature 103.7, tachycardic 120s. Patient has clinically improved, has been treated with IV antibiotics including vancomycin, and aztreonam. Patient has been evaluated by PT, recommended SNF, however  patient would like to be discharged home states that her husband and daughter can care for her.  Detailed hospital close/management mentioned below:  Pneumonia of both lungs due to infectious organism, leukocytosis, COPD, sleep apnea Clinically improving, speaking complete sentences. WBC was trended down to 7.8. Blood cultures x 2 no growth 48 hours. Continue vancomycin and aztreonam that were initiated in the [place] for pulmonary toiletry. Will discharge on clindamycin 450 mg every 8 hours x 5 days. Oxygen support, has been refusing CPAP at night Nebulizers every 6 hours  Chronic diastolic heart failure Monitor IV fluid quantities Monitor for fluid overload Echocardiogram 1/23 normal EF 60 to 65% with G1 DD She is not on home diuretics  Left lower extremity DVT Continue Xarelto Doppler 2/9 confirms DVT in the left common femoral vein Will need outpt Vascular Clinic Referral       09/25/2022  re-establish s/p Admit: f/u ov/[name] office/[name] re: GOLD 3 copd / 02 dep and still smoking  maint on telegy  / DNI status per last admit.  Chief Complaint  Patient presents with   Hospitalization Follow-up    HFU [facility] 2/8 pneumonia and sepsis  AP 12/26-12/29 COPD    Dyspnea:  unsteady on feet/ clot L leg x one year and leg stayed swollen since   Cough: slt slt cream  Sleeping: hosp bed 10 degrees  SABA use: way too much 02: 3lpm 24/7  Rec Plan A = Automatic = Always=    Trelegy 100 one click each am   Plan B = Backup (to supplement plan A, not to replace it) Only use your albuterol inhaler as a rescue medication Plan C = Crisis (instead of Plan B but only if Plan B stops working) - only use your albuterol nebulizer if you first try Plan B and it fails to help > ok to use the nebulizer up to every 4 hours but if start needing it regularly call for immediate appointment Also  Ok to try albuterol 15 min before an activity (on alternating days)  that you know would usually make you  short of breath and see if it makes any difference and if makes none then don't take albuterol after activity unless you can't catch your breath as this means it's the resting that helps, not the albuterol. After use of Trelegy use arm and hammer tooth paste Call your pharmacist to fine out what the antibiotic was that could tolerate that actually worked  Make sure you check your oxygen saturation  AT  your highest level of activity (not after you stop)   to be sure it stays over 90%  The key is to stop smoking completely before smoking completely stops you! For cough/ congestion > mucinex 1200 mg every 12hours as need and flutter valve as much as possible  and if can't stop coughing try to switch entirely to the nebulizer up to every 4 hours as needed    06/24/2023  [place] pt  re-establishing  ov/[place] office/Auria Mckinlay re: GOLD 3 copd/02 dep  maint on Trelegy   did not go for fu cxr on last SOOD  ov 02/16/23  Chief Complaint  Patient presents with   COPD GOLD 3  Dyspnea:  limited by L knee to motorized w/c Cough: worse cough since 06/22/23 > green sputum Sleeping: hob up 30 degrees cough x sev days acutely - was not having trouble prior to flare SABA use: twice daily  02: 3lpm bed time and prn daytime  Rec Cipro 500 mg twice daily x  10days  Prednisone 10 mg take  4 each am x 2 days,   2 each am x 2 days,  1 each am x 2 days and stop  Plan A = Automatic = Always=    Trelegy 100  Plan B = Backup (to supplement plan A, not to replace it) Only use your albuterol inhaler as a rescue medication  Plan C = Crisis (instead of Plan B but only if Plan B stops working) - only use your albuterol nebulizer if you first try Plan B Make sure you check your oxygen saturation  AT  your highest level of activity (not after you stop)   to be sure it stays over 90%         09/10/2023  ACUTE  ov/[place] office/Branden Vine re: GOLD 3 copd  maint on trelegy 100 and 02 3lpm   Chief Complaint  Patient presents with    Acute Visit    Pt is having sob and coughing mucus   Dyspnea:  worse x 4 days  Cough: yellow dark thick  Sleeping:  30 degrees  only needed to use rescue x one  SABA use: last neb 3 h   02: 3lpm  24 7   Lung cancer screening: today    No obvious day to day or daytime variability or assoc  mucus plugs or hemoptysis or cp or chest tightness, subjective wheeze or overt sinus or hb symptoms.    Also denies any obvious fluctuation of symptoms with weather or environmental changes or other aggravating or alleviating factors except as outlined above   No unusual exposure hx or h/o childhood pna/ asthma or knowledge of premature birth.  Current Allergies, Complete Past Medical History, Past Surgical History, Family History, and Social History were reviewed in [employer] record.  ROS  The following are not active complaints unless bolded Hoarseness, sore throat, dysphagia, dental problems, itching, sneezing,  nasal congestion or discharge of excess mucus or purulent secretions, ear ache,   fever, chills, sweats, unintended wt loss or wt gain, classically pleuritic or exertional cp,  orthopnea pnd or arm/hand swelling  or leg swelling, presyncope, palpitations, abdominal pain, anorexia, nausea, vomiting, diarrhea  or change in bowel habits or change in bladder habits, change in stools or change in urine, dysuria, hematuria,  rash, arthralgias, visual complaints, headache, numbness, weakness or ataxia or problems with walking or coordination,  change in mood or  memory.        Current Meds  Medication Sig   albuterol (PROAIR HFA) 108 (90 Base) MCG/ACT inhaler INHALE TWO PUFFS INTO THE LUNGS EVERY 6 HOURS AS NEEDED FOR SHORTNESS OF BREATH (Patient taking differently: Inhale 2 puffs into the lungs every 6 (six) hours as needed for shortness of breath or wheezing.)   albuterol (PROVENTIL) (2.5 MG/3ML) 0.083% nebulizer solution USE 1 VIAL IN NEBULIZER EVERY 6 HOURS AS NEEDED FOR  SHORTNESS OF BREATH (Patient taking differently: Take 2.5 mg by nebulization every 6 (six) hours as  needed for wheezing or shortness of breath.)   allopurinol (ZYLOPRIM) 100 MG tablet Take 100 mg by mouth daily.   ALPRAZolam (XANAX) 0.25 MG tablet Take 0.25 mg by mouth 2 (two) times daily.   Azelastine HCl (ASTEPRO) 0.15 % SOLN Place 1 spray into the nose 2 (two) times daily as needed (Allergies).   Cholecalciferol (VITAMIN D3) 50 MCG (2000 UT) capsule TAKE ONE CAPSULE BY MOUTH DAILY (Patient taking differently: Take 2,000 Units by mouth daily.)   famotidine (PEPCID) 40 MG tablet Take 40 mg by mouth at bedtime.   Fluticasone-Umeclidin-Vilant (TRELEGY ELLIPTA) 200-62.5-25 MCG/ACT AEPB Inhale 1 puff into the lungs daily.   Fluticasone-Umeclidin-Vilant (TRELEGY ELLIPTA) 200-62.5-25 MCG/ACT AEPB Inhale 1 puff into the lungs daily.   gabapentin (NEURONTIN) 100 MG capsule Take 1 capsule (100 mg total) by mouth at bedtime. (Patient taking differently: Take 100 mg by mouth 3 (three) times daily.)   guaiFENesin (MUCINEX) 600 MG 12 hr tablet Take 2 tablets (1,200 mg total) by mouth 2 (two) times daily as needed. (Patient taking differently: Take 1,200 mg by mouth 2 (two) times daily as needed for cough or to loosen phlegm.)   ipratropium-albuterol (DUONEB) 0.5-2.5 (3) MG/3ML SOLN Take 3 mLs by nebulization every 6 (six) hours as needed (Wheezing/sob).   loratadine (ALLERGY RELIEF) 10 MG tablet Take 1 tablet (10 mg total) by mouth daily.   montelukast (SINGULAIR) 10 MG tablet TAKE ONE TABLET BY MOUTH AT BEDTIME   nicotine (NICODERM CQ - DOSED IN MG/24 HOURS) 14 mg/24hr patch Place 1 patch (14 mg total) onto the skin daily.   ondansetron (ZOFRAN) 4 MG tablet Take 1 tablet (4 mg total) by mouth daily as needed for nausea or vomiting.   oxyCODONE-acetaminophen (PERCOCET/ROXICET) 5-325 MG tablet Take 1 tablet by mouth every 6 (six) hours as needed for severe pain.   OXYGEN Inhale 3 L into the lungs continuous.    pantoprazole (PROTONIX) 40 MG tablet Take 1 tablet (40 mg total) by mouth 2 (two) times daily. **PLEASE CALL OFFICE TO SCHEDULE FOLLOW UP   predniSONE (DELTASONE) 10 MG tablet Take 4 tabs for 2 days, then 3 tabs for 2 days, 2 tabs for 2 days, then 1 tab for 2 days, then stop.   QUEtiapine (SEROQUEL) 100 MG tablet TAKE ONE TABLET BY MOUTH AT BEDTIME   simvastatin (ZOCOR) 40 MG tablet TAKE ONE TABLET BY MOUTH AT BEDTIME (Patient taking differently: Take 40 mg by mouth daily at 6 PM.)   topiramate (TOPAMAX) 100 MG tablet TAKE TWO TABLETS BY MOUTH TWICE DAILY   traMADol (ULTRAM) 50 MG tablet Take 1 tablet (50 mg total) by mouth every 6 (six) hours as needed.   triamcinolone cream (KENALOG) 0.1 % Apply 1 Application topically daily as needed (irritation).                Objective:   Physical Exam  Wts   09/10/2023     185 06/24/2023     180  09/25/22 174 lb (78.9 kg)  08/07/22 177 lb 14.6 oz (80.7 kg)  07/10/22 176 lb (79.8 kg)    Vital signs reviewed  09/10/2023  - Note at rest 02 sats  98% on 3lpm cont   General appearance:    w/c bound / hoarse wf   / congested cough   HEENT : Mask in place    NECK :  without  apparent JVD/ palpable Nodes/TM    LUNGS: no acc muscle use,  Mild barrel  contour chest  wall with bilateral  Distant exp sonorous rhonchi and  without cough on insp or exp maneuvers  and mild  Hyperresonant  to  percussion bilaterally     CV:  RRR  no s3 or murmur or increase in P2, and no edema   ABD:  soft and nontender with pos end  insp Hoover's  in the supine position.  No bruits or organomegaly appreciated   MS:  Nl gait/ ext warm without deformities Or obvious joint restrictions  calf tenderness, cyanosis or clubbing     SKIN: warm and dry without lesions    NEURO:  alert, approp, nl sensorium with  no motor or cerebellar deficits apparent.        Assessment & Plan:

## 2023-09-16 NOTE — Progress Notes (Signed)
 OFFICE VISIT Assessment/Plan:    Problem List Items Addressed This Visit       Digestive   Gastroesophageal reflux disease with esophagitis without hemorrhage   Relevant Medications   pantoprazole  (PROTONIX ) 40 MG tablet     Respiratory   Obstructive sleep apnea   Relevant Medications   montelukast  (SINGULAIR ) 10 mg tablet   loratadine  (CLARITIN ) 10 mg tablet   COPD (chronic obstructive pulmonary disease) (CMS-HCC)   Relevant Medications   montelukast  (SINGULAIR ) 10 mg tablet   loratadine  (CLARITIN ) 10 mg tablet   fluticasone  propionate (FLONASE  ALLERGY RELIEF) 50 mcg/actuation nasal spray     Cardiovascular and Mediastinum   Migraine without status migrainosus, not intractable   Chronic diastolic heart failure (CMS-HCC)   Relevant Medications   simvastatin  (ZOCOR ) 40 MG tablet     Nervous and Auditory   Seizure disorder (CMS-HCC)   Neuropathy     Genitourinary   Stage 3a chronic kidney disease (CMS-HCC)   Relevant Orders   Comprehensive Metabolic Panel (Completed)     Other   HLD (hyperlipidemia)   Relevant Medications   simvastatin  (ZOCOR ) 40 MG tablet   Other Relevant Orders   CBC w/ Differential (Completed)   Lipid Panel (Completed)   TSH (Completed)   Comprehensive Metabolic Panel (Completed)   Other Visit Diagnoses       COPD exacerbation (CMS-HCC)    -  Primary   Relevant Medications   montelukast  (SINGULAIR ) 10 mg tablet   loratadine  (CLARITIN ) 10 mg tablet     Iron deficiency       Relevant Orders   Iron Level (Completed)   Ferritin (Completed)      Stable, pleasant Lady  Will get labs Continue current meds - Kenalog  40mg  IM x1 given for migraine Today, BP is BP: 120/80 and BMI is Body mass index is 36.68 kg/m. BMI: The patient's BMI is abnormal/obese (BMI >30) and patient is advised to lose weight through proper diet and exercise. HLD: Monitor labs, continue with statin therapy. Insomnia: Continue with present treatment, patient pleased  with insomnia remission on current regimen.  Notes from all specialist visits have been thoroughly reviewed which are available in Epic, Media and Care Everywhere sections since the last office visit with me. The patient and I have discussed recent diagnostic imaging results, medication changes and treatment plan updates with other providers in their care since last office visit.  F/u with specialist(s) as planned Return in about 6 months (around 03/15/2024) for Routine followup visit.  Subjective:   Chief Complaint  Patient presents with  . Follow-up    3 month follow up  reports Pulmonologist recently prescribed prednisone  and she has completed but still having breathing concerns    HPI: husband present   [name] is a cooperative and pleasant 68 y.o. female who presents today for chronic disease management:  COPD, OSA, CKD3a, DVT (09/2022), Sz, HLD, tobacco use  Interval History: -- Doing okay overall -- Taking meds daily without issues -- Eye Exam - UTD  -- BP check - not checking -- EX - limited, keeps busy -- Sz- reports having 5 events in Thanksgiving and 1 December - Lamictal  therapy  -- Pulmonary last week - given Steroid and ABX, finished steroids and reports no change in how she feels.  -- dealing with severe HA, feels like her typical migraine. Started a couple days ago.  Usually gets a steroid injection to help abort migraine. Took last dose of oral steroid this morning.  --  seeing pain specialist in [facility] - first visit yesterday. For chronic back pain and neuropathy  -- saw Endo for possible Hypothyroidism  Preventive Care Reviewed in EMR Health Maintenance Activity Health Maintenance Due  Topic Date Due  . COPD Spirometry  Never done  . DEXA Scan  Never done  . Medicare Initial Physical (IPPE)  Never done  . Hepatitis C Screen  Never done  . Lung Cancer Screening Shared Decision Making  Never done  . Zoster Vaccines (1 of 2) Never done  . Colon Cancer  Screening  10/28/2021  . DTaP/Tdap/Td Vaccines (3 - Td or Tdap) 01/06/2023  . COVID-19 Vaccine (1 - 2024-25 season) Never done   Immunization History  Administered Date(s) Administered  . INFLUENZA IIV3 HIGH DOSE 65YRS+(FLUZONE) 05/06/2023  . Influenza Vaccine Quad(IM)6 MO-Adult(PF) 05/10/2013, 04/19/2014, 06/25/2015, 04/15/2016, 05/12/2017, 05/11/2019, 05/16/2020, 05/15/2021    ROS: General ROS: negative for - chills, fever or night sweats. ENT ROS: negative for - sinus pain, sore throat or vocal changes. Endocrine ROS: negative for - hair pattern changes or skin changes. Respiratory ROS: no cough, shortness of breath, or wheezing. Cardiovascular ROS: no chest pain or dyspnea on exertion. Gastrointestinal ROS: no abdominal pain, change in bowel habits, or black or bloody stools. Genito-Urinary ROS: no dysuria, trouble voiding, or hematuria. Musculoskeletal ROS: negative for - muscular weakness. Dermatological ROS: negative for rash. Neurological ROS: negative for acute change in gait or cognition. Psychological ROS: negative for - disorientation, hallucinations or memory difficulties.  Medical/Family History  Allergies  Allergen Reactions  . Azithromycin Rash and Swelling    swelling all over, high, thrush swelling all over, high, thrush   . Cefdinir      Throat swelling after 3rd dose  . Clarithromycin Hives    swelling swelling   . Clindamycin  Anaphylaxis  . Doxycycline  Hyclate Anaphylaxis and Rash    swelling swelling   . Ketorolac  Tromethamine  Other (See Comments)    Seizure  . Levofloxacin  Anaphylaxis  . Nirmatrelvir -Ritonavir  Swelling    Throat swelling  . Nortriptyline Other (See Comments), Rash and Swelling    Per patient had kidney problems on this medication, could not use bathroom (urinary retention), swelling and breakout Per patient had kidney problems on this medication, could not use bathroom (urinary retention)   . Other Rash, Shortness Of  Breath and Swelling    break out from head to toe  . Penicillins Anaphylaxis, Rash and Shortness Of Breath    Breakout, mouth swells almost died, ended up in hospital Breakout, mouth swells ALMOST DIED, ended up in hospital  PCN reaction causing immediate rash, facial/tongue/throat swelling, SOB or lightheadedness with hypotension: YES PCN reaction causing severe rash involving mucus membranes or skin necrosis: NO PCN reaction that required hospitalization YES Has patient had a PCN reaction occurring within the last 10 years: NO   . Sulfa (Sulfonamide Antibiotics) Rash, Shortness Of Breath and Swelling    break out from head to toe  . Varenicline  Other (See Comments)    bad dreams, difficulty breathing bad dreams, difficulty breathing   . Sumatriptan Succinate Other (See Comments)    Mouth breaks out and start itching  . Codeine  Nausea And Vomiting  . Fluticasone  Propion-Salmeterol Other (See Comments)    REACTION: ulcers in mouth REACTION: ulcers in mouth   . Naratriptan Rash    Mouth breaks out and start itching  . Sulfamethoxazole Rash  . Triptans-5-Ht1 Antimigraine Agents Rash    Mouth breaks out and start itching  Outpatient Medications Prior to Visit  Medication Sig Dispense Refill  . acetaminophen  (TYLENOL ) 500 MG tablet Take 1 tablet (500 mg total) by mouth.    . albuterol  (ACCUNEB ) 1.25 mg/3 mL nebulizer solution Inhale 3 mL (1.25 mg total) by nebulization every six (6) hours as needed for wheezing. 90 mL 0  . aspirin  (ECOTRIN) 81 MG tablet Take 1 tablet (81 mg total) by mouth.    . ciprofloxacin  HCl (CIPRO ) 500 MG tablet Take 1 tablet (500 mg total) by mouth two (2) times a day.    . famotidine  (PEPCID ) 40 MG tablet Take 1 tablet (40 mg total) by mouth daily.    . ferrous sulfate  325 (65 FE) MG tablet TAKE 1 TABLET (324 MG TOTAL) BY MOUTH EVERY OTHER DAY 45 tablet 0  . fluticasone -umeclidin-vilanter (TRELEGY ELLIPTA ) 100-62.5-25 mcg inhaler Inhale 1 puff  daily.    . gabapentin  (NEURONTIN ) 100 MG capsule Take 1 capsule (100 mg total) by mouth Three (3) times a day. 90 capsule 2  . guaiFENesin  (MUCINEX ) 600 mg 12 hr tablet Take 1 tablet (600 mg total) by mouth Two (2) times a day. 20 tablet 0  . ipratropium-albuterol  (DUO-NEB) 0.5-2.5 mg/3 mL nebulizer USE 1 VIAL VIA NEBULIZER EVERY 6 HOURS AS NEEDED    . ondansetron  (ZOFRAN ) 4 MG tablet Take 1 tablet (4 mg total) by mouth.    . OXYGEN -AIR DELIVERY SYSTEMS MISC Inhale 3 L.    . QUEtiapine  (SEROQUEL ) 100 MG tablet Take 1 tablet (100 mg total) by mouth nightly.    . sodium chloride  (SALINE MIST) 0.65 % nasal spray 4 drops (1 spray total) into each nostril every six (6) hours as needed for congestion. 15 mL 12  . topiramate  (TOPAMAX ) 100 MG tablet Take 2 tablets (200 mg total) by mouth two (2) times a day.    . triamcinolone  (KENALOG ) 0.1 % cream Apply 1 g topically Three (3) times a day.    . VENTOLIN  HFA 90 mcg/actuation inhaler Inhale 2 puffs every four (4) hours as needed.    SABRA VITAMIN D3 50 mcg (2,000 unit) cap Take 1 capsule (50 mcg total) by mouth daily.    SABRA lactulose 10 gram/15 mL solution 15 mL (10 g total).    . lamoTRIgine  (LAMICTAL  XR) 200 mg TR24 Take 2 tablets (400 mg total) by mouth daily.    . loratadine  (CLARITIN ) 10 mg tablet Take 1 tablet (10 mg total) by mouth daily.    . montelukast  (SINGULAIR ) 10 mg tablet Take 1 tablet (10 mg total) by mouth nightly. 30 tablet 0  . pantoprazole  (PROTONIX ) 40 MG tablet Take 1 tablet (40 mg total) by mouth two (2) times a day.    . simvastatin  (ZOCOR ) 40 MG tablet Take 1 tablet (40 mg total) by mouth nightly.    . nicotine  (NICODERM CQ ) 14 mg/24 hr patch Place 14 mg on the skin.    . traMADol  (ULTRAM ) 50 mg tablet Take 1 tablet (50 mg total) by mouth every six (6) hours as needed.     No facility-administered medications prior to visit.   Past Medical History:  Diagnosis Date  . Allergies   . Arthritis   . CKD (chronic kidney disease)   .  COPD (chronic obstructive pulmonary disease) (CMS-HCC)   . GERD (gastroesophageal reflux disease)   . History of migraine headaches   . History of sepsis   . Hyperlipidemia   . Hypokalemia   . Iron deficiency   . Neuropathy   .  Seizures (CMS-HCC)   . Stage II pressure ulcer of right buttock (CMS-HCC)    Past Surgical History:  Procedure Laterality Date  . CARPAL TUNNEL RELEASE Right   . HYSTERECTOMY  2021   Partial hysterectomy  . KNEE ARTHROPLASTY Right   . SHOULDER ARTHROSCOPY W/ ROTATOR CUFF REPAIR Left    Family History  Problem Relation Age of Onset  . Breast cancer Maternal Aunt        Diagnose in her 43's   Social History   Tobacco Use  . Smoking status: Every Day    Current packs/day: 0.50    Average packs/day: 1 pack/day for 25.8 years (25.4 ttl pk-yrs)    Types: Cigarettes    Start date: 12/2022  . Smokeless tobacco: Never  Vaping Use  . Vaping status: Never Used  Substance Use Topics  . Alcohol use: Never  . Drug use: Never       Social History   Social History Narrative  . Not on file   Objective:  Vital Signs Vitals:   09/16/23 0853  BP: 120/80  BP Site: L Arm  BP Position: Sitting  BP Cuff Size: Medium  Pulse: 75  Resp: 22  Temp: 36.9 C (98.4 F)  TempSrc: Temporal  SpO2: 98%  Weight: 85.2 kg (187 lb 12.8 oz)  Height: 152.4 cm (5')   Body mass index is 36.68 kg/m.     09/16/23 0853  PainSc: 10-Worst pain ever   BP Readings from Last 3 Encounters:  09/16/23 120/80  07/16/23 122/72  06/03/23 118/74   Wt Readings from Last 3 Encounters:  09/16/23 85.2 kg (187 lb 12.8 oz)  07/16/23 81.6 kg (180 lb)  06/03/23 80.8 kg (178 lb 3.2 oz)   The 10-year ASCVD risk score (Arnett DK, et al., 2019) is: 6.8%   Values used to calculate the score:     Age: 31 years     Sex: Female     Is Non-Hispanic African American: No     Diabetic: No     Tobacco smoker: Yes     Systolic Blood Pressure: 120 mmHg     Is BP treated: No     HDL  Cholesterol: 76 mg/dL     Total Cholesterol: 153 mg/dL  Note: For patients with SBP <90 or >200, Total Cholesterol <130 or >320, HDL <20 or >100 which are outside of the allowable range, the calculator will use these upper or lower values to calculate the patient's risk score.  Physical Exam General: Well-developed and well-nourished.  No apparent distress. Obese EYES: Anicteric sclerae. ENT: Oropharynx moist. NECK: Normal size and contour. Thyroid  smooth and symmetric without focal nodules.  RESP: Relaxed respiratory effort. Auscultated with Exp wheezes throughout all fields. No crackles  CV: Regular rate and rhythm. Normal S1 and S2. No murmurs or gallops.  No lower extremity edema. (B) brachial pulses are 2+ and symmetric.  GI: Normal abdominal bowel sounds. Soft, non-tender, and non-distended MSK: No focal muscle tenderness. SKIN: Appropriately warm and moist.  No rash appreciated. NEURO: w/c bound today  PSYCHIATRIC: Alert and oriented. Speech fluent and sensible. No psychomotor agitation or retardation.  Recent Laboratory / Diagnostic Testing Office Visit on 05/06/2023  Component Date Value Ref Range Status  . Hemoglobin A1C 05/06/2023 5.4  4.8 - 5.6 % Final  . Estimated Average Glucose 05/06/2023 108  mg/dL Final  . Triglycerides 05/06/2023 104  0 - 150 mg/dL Final  . Cholesterol 90/74/7975 188  <=200 mg/dL Final  .  HDL 05/06/2023 86 (H)  40 - 60 mg/dL Final  . LDL Calculated 05/06/2023 81  40 - 100 mg/dL Final  . VLDL Cholesterol Cal 05/06/2023 20.8  11 - 41 mg/dL Final  . Chol/HDL Ratio 05/06/2023 2.2  1.0 - 4.5 Final  . Non-HDL Cholesterol 05/06/2023 102  70 - 130 mg/dL Final  . FASTING 90/74/7975 No   Final  . TSH 05/06/2023 1.297  0.550 - 4.780 uIU/mL Final  . Free T4 05/06/2023 0.64 (L)  0.89 - 1.76 ng/dL Final  . Sodium 90/74/7975 139  135 - 145 mmol/L Final  . Potassium 05/06/2023 4.0  3.5 - 5.0 mmol/L Final  . Chloride 05/06/2023 104  98 - 107 mmol/L Final  . CO2  05/06/2023 26.6  21.0 - 32.0 mmol/L Final  . Anion Gap 05/06/2023 8  3 - 11 mmol/L Final  . BUN 05/06/2023 26 (H)  8 - 20 mg/dL Final  . Creatinine 90/74/7975 1.16 (H)  0.60 - 1.10 mg/dL Final  . BUN/Creatinine Ratio 05/06/2023 22   Final  . eGFR CKD-EPI (2021) Female 05/06/2023 52 (L)  >=60 mL/min/1.50m2 Final  . Glucose 05/06/2023 94  70 - 179 mg/dL Final  . Calcium  05/06/2023 9.4  8.5 - 10.1 mg/dL Final  . Albumin 90/74/7975 3.5  3.5 - 5.0 g/dL Final  . Total Protein 05/06/2023 8.3 (H)  6.0 - 8.0 g/dL Final  . Total Bilirubin 05/06/2023 0.2 (L)  0.3 - 1.2 mg/dL Final  . AST 90/74/7975 9 (L)  15 - 40 U/L Final  . ALT 05/06/2023 12  12 - 78 U/L Final  . Alkaline Phosphatase 05/06/2023 126 (H)  46 - 116 U/L Final  . WBC 05/06/2023 8.3  4.0 - 10.5 10*9/L Final  . RBC 05/06/2023 3.72 (L)  3.80 - 5.10 10*12/L Final  . HGB 05/06/2023 11.7  11.5 - 15.0 g/dL Final  . HCT 90/74/7975 36.9  34.0 - 44.0 % Final  . MCV 05/06/2023 99.2 (H)  80.0 - 98.0 fL Final  . MCH 05/06/2023 31.5  27.0 - 34.0 pg Final  . MCHC 05/06/2023 31.7 (L)  32.0 - 36.0 g/dL Final  . RDW 90/74/7975 14.2  11.5 - 14.5 % Final  . MPV 05/06/2023 10.6 (H)  7.4 - 10.4 fL Final  . Platelet 05/06/2023 211  140 - 415 10*9/L Final  . Neutrophils % 05/06/2023 57.0  % Final  . Lymphocytes % 05/06/2023 31.3  % Final  . Monocytes % 05/06/2023 9.2  % Final  . Eosinophils % 05/06/2023 1.7  % Final  . Basophils % 05/06/2023 0.4  % Final  . Absolute Neutrophils 05/06/2023 4.7  1.8 - 7.8 10*9/L Final  . Absolute Lymphocytes 05/06/2023 2.6  0.7 - 4.5 10*9/L Final  . Absolute Monocytes 05/06/2023 0.8  0.1 - 1.0 10*9/L Final  . Absolute Eosinophils 05/06/2023 0.1  0.0 - 0.4 10*9/L Final  . Absolute Basophils 05/06/2023 0.0  0.0 - 0.2 10*9/L Final   Medication adherence and barriers to the treatment plan have been addressed. Opportunities to optimize healthy behaviors have been discussed. Patient / caregiver voiced  understanding.  --------------------------------------------------------------------------------------------------------------------------- This note was dictated with Dragon medical and may contain typos missed during proofreading.

## 2023-09-22 ENCOUNTER — Telehealth: Payer: Self-pay | Admitting: Internal Medicine

## 2023-09-22 MED ORDER — ALBUTEROL SULFATE HFA 108 (90 BASE) MCG/ACT IN AERS: 2.0000 | INHALATION_SPRAY | Freq: Four times a day (QID) | RESPIRATORY_TRACT | 6 refills | Status: DC | PRN

## 2023-09-22 NOTE — Telephone Encounter (Signed)
Ventolin refill needed.  [facility] in [place]

## 2023-09-22 NOTE — Telephone Encounter (Signed)
Albuterol sent to preferred pharmacy.  Lm for patient.

## 2023-09-23 NOTE — Telephone Encounter (Signed)
Lm x2 for patient.  Will close encounter per office protocol.

## 2023-09-24 ENCOUNTER — Other Ambulatory Visit: Payer: Self-pay | Admitting: Neurology

## 2023-10-01 ENCOUNTER — Encounter: Payer: Self-pay | Admitting: Gastroenterology

## 2023-10-02 ENCOUNTER — Telehealth: Payer: Self-pay

## 2023-10-02 ENCOUNTER — Ambulatory Visit: Payer: 59

## 2023-10-02 VITALS — Ht 60.0 in | Wt 187.0 lb

## 2023-10-02 DIAGNOSIS — K589 Irritable bowel syndrome without diarrhea: Secondary | ICD-10-CM

## 2023-10-02 NOTE — Telephone Encounter (Signed)
RN had pre-visit appointment with patient prior to scheduled [facility] colonoscopy. During conversation, RN assessed that patient has multiple serious comorbidities. RN contacted Cathlyn Parsons who confirmed that patient needs to be scheduled for her procedure at the hospital. Additionally, Dr. Myrtie Neither may want to see patient in his office prior to procedure.   RN will contact Dr. Myrtie Neither regarding this situation.

## 2023-10-02 NOTE — Progress Notes (Signed)
No egg or soy allergy known to patient  No issues known to pt with past sedation with any surgeries or procedures Patient states that she is high risk for procedures No FH of Malignant Hyperthermia Pt is not on diet pills Pt is not on  home 02  Pt is not on blood thinners  Pt denies issues with constipation  No A fib or A flutter Have any cardiac testing pending--no

## 2023-10-02 NOTE — Telephone Encounter (Signed)
RN was conducting pre-visit with patient prior to [facility] colonoscopy currently scheduled for 10/14/23. As RN was completing documentation for patient history, patient stated she Is concerned, based on conversations with her PCP that she may have colon cancer, so she is very anxious to have a colonoscopy ASAP. She has been told that she is "high risk" for procedures. RN noted many serious comorbidities, relies on 3L of oxygen, and patient stated she had 5 seizures on Thanksgiving Day, 2024.   RN will communicate with Cathlyn Parsons, CRNA for confirmation of location for patient's colonoscopy.

## 2023-10-02 NOTE — Telephone Encounter (Signed)
I truly do not know how this patient even got to an [facility] nurse previsit for consideration of a colonoscopy given what we know of her medical conditions at the time she was last seen in clinic in April 2024.  I do not even see a referral for colorectal cancer screening.  She needs to be seen in clinic by me or one of the APP's in our pod Shanda Bumps, [place]) for reevaluation.    Ellwood Dense MD

## 2023-10-06 NOTE — Telephone Encounter (Signed)
 Inbound call from patient returning phone call regarding previous notes. Wishes to speak with nurse. Please advise, thank you.

## 2023-10-06 NOTE — Telephone Encounter (Signed)
 Per Dr. Myrtie Neither notes, an office visit was scheduled with him for 10/13/23.  Colonoscopy remains on the schedule until Dr. Myrtie Neither makes his assessment to determine location of procedure.

## 2023-10-06 NOTE — Telephone Encounter (Signed)
 RN attempted call to patient regarding her procedure needing to be completed at the hospital and her need for scheduling an office visit prior to that procedure, per Dr. Myrtie Neither. RN left vm for patient and provided [facility] phone number.

## 2023-10-12 ENCOUNTER — Encounter: Payer: Self-pay | Admitting: Certified Registered Nurse Anesthetist

## 2023-10-13 ENCOUNTER — Ambulatory Visit: Payer: 59 | Admitting: Gastroenterology

## 2023-10-13 ENCOUNTER — Encounter: Payer: Self-pay | Admitting: Gastroenterology

## 2023-10-13 VITALS — BP 130/76 | HR 80 | Ht 60.0 in | Wt 191.0 lb

## 2023-10-13 DIAGNOSIS — R1313 Dysphagia, pharyngeal phase: Secondary | ICD-10-CM

## 2023-10-13 DIAGNOSIS — R140 Abdominal distension (gaseous): Secondary | ICD-10-CM | POA: Diagnosis not present

## 2023-10-13 DIAGNOSIS — K5909 Other constipation: Secondary | ICD-10-CM

## 2023-10-13 DIAGNOSIS — R195 Other fecal abnormalities: Secondary | ICD-10-CM

## 2023-10-13 MED ORDER — GOLYTELY 236 G PO SOLR: 4000.0000 mL | Freq: Once | ORAL | 0 refills | Status: AC

## 2023-10-13 MED ORDER — METOCLOPRAMIDE HCL 5 MG PO TABS: 5.0000 mg | ORAL_TABLET | Freq: Four times a day (QID) | ORAL | 0 refills | Status: DC

## 2023-10-13 MED ORDER — METOCLOPRAMIDE HCL 5 MG PO TABS: 5.0000 mg | ORAL_TABLET | Freq: Two times a day (BID) | ORAL | 0 refills | Status: AC | PRN

## 2023-10-13 NOTE — Progress Notes (Signed)
 [facility] progress note:  History: [name] 10/13/2023  Referring provider: Leighton Ruff, FNP  Reason for consult/chief complaint: Colonoscopy (PT states she would like to discuss her colon for tomorrow./)   Subjective  Prior history: From [facility] office visit April 2024: Elliot Simoneaux is a 68 year old female with a past medical history of arthritis, depression, chronic diastolic CHF, asthma, emphysema, COPD on home oxygen, OSA (nonadherent with CPAP), aspiration pneumonia, LLE DVT on Xarelto, epilepsy/seizures, GERD and chronic constipation. She presents today with complaints of seeing a small amount of bright red blood on the toilet tissue 2 to 3 times which last occurred 2 weeks ago. She has chronic constipation and passes 2 to 6 small hard stools once or twice weekly. She takes Miralax a few days weekly. No abdominal pain. She sometimes has anal discomfort. She is sedentary. She endorsed having weak knees with balance issues therefore she utilizes a wheelchair throughout the day but is able to stand and walk short distances. She had chronic dysphagia for many years which has not improved since she underwent an EGD 04/16/2021 and was treated for candidiasis esophagitis. She describes having food which gets stuck to above and sometimes below the suprasternal notch. She coughs or gags and the food either goes down or comes out. Drinking water worsens her symptoms. Her mouth stays dry. She drinks at least 48 ounces of water daily.  She remains on Pantoprazole 40mg  twice daily and Famotidine PRN at bed time.    She was hospitalized with multifocal pneumonia 07/2022 [facility]) and 09/2022 [facility]). Diagnosed with LLE DVT 09/2022 on Xarelto.    Labs 08/08/2022: WBC 10.9. Hg 9.5. HCT 31.2. PLT 206. BUN 22. Cr. 0.93.     ECHO 09/03/2021: 1.Left ventricular ejection fraction, by estimation, is 60 to 65%. The left ventricle has normal function. The  left ventricle has no regional wall motion abnormalities. Left ventricular diastolic parameters are consistent with Grade I diastolic dysfunction (impaired relaxation). The average left ventricular global longitudinal strain is -26.9 %. The global longitudinal strain is normal.  2. Right ventricular systolic function is normal. The right ventricular size is normal. The mitral valve is normal in structure. No evidence of mitral valve regurgitation. No evidence of mitral stenosis.  3. The aortic valve is normal in structure. Aortic valve regurgitation is not visualized. No aortic stenosis is present.  4. The inferior vena cava is normal in size with greater than 50% respiratory variability, suggesting right atrial pressure of 3 mmHg.   Modified barium swallow study 4/13/202: Mild pharyngeal dysphagia resulting in laryngeal vestibule penetration with thin liquids likely due to chronic respiratory compromise of COPD.    Modified barium swallow study 02/12/2021: She continues to have penetration of thin liquids as a result of impaired timing, but this does not happen consistently and is often cleared spontaneously (PAS 2). She had two instances of trace penetration that did not clear and reached her true vocal folds, but when cued to clear her throat, this expelled the barium from her laryngeal vestibule - a strategy that was not effective in the past. Note that the barium tablet did clear the esophagus, although it required a little extra time and liquid to do so (MD not present to confirm). Recommend that pt continue with regular solids and thin liquids as tolerated with use of an intermittent throat clear as an added precautions.    EGD 04/16/2021: - Candidiasis esophagitis with no bleeding.  Biopsied.  - Z-line regular, 38 cm from the incisors.  - Normal stomach.  - Normal examined duodenum.  A. ESOPHAGUS, RANDOM, BIOPSY:  -  Reactive squamous mucosa with fungal elements  -  No dysplasia or malignancy  identified    EGD 10/29/2011:  Normal esophagus, dilated 48F   Colonoscopy 10/29/2011 by Dr. Juanda Chance: Normal colonoscopy   _______________  Internal hemorrhoids found on exam and anoscopy by APP at the April 2024 visit. Endoscopic procedures (colonoscopy or endoscopy) were not scheduled due to the patient's high risk for undergoing sedation as well as discovery of an apparent bleeding source on the exam.  I do not feel an EGD or repeat empiric therapy for esophageal candidiasis was warranted since she is known to have a transfer dysphagia based on everything noted above and had not improved with prior candidiasis treatment.  _____________________  Elenora Gamma this patient was scheduled for a preprocedure visit with one of our endoscopy nurses last month.  Unclear at this time how that happened, since she was not scheduled for any procedures by the APP to last saw her nor by me.  Does not appear to be any new referral from primary care that I can see.  Endoscopy nurse note this patient's multiple complex medical issues and sent me a chart message, at which time I felt clinic follow-up was warranted as she is too high risk for procedures in our ASC. Antaniya says her primary care provider recently did stool test because she was anemic and says it was positive and they are concerned she might have colon cancer. Note from her primary care provider Leighton Ruff, FNP in [place], [place] dated 09/16/2023 lists her multiple medical issues and medicines, and also indicates she had been treated by her pulmonary physician with antibiotics and steroids shortly before that without improvement in her cough and breathing.  Some labs were also done with results noted below  Discussed the use of AI scribe software for clinical note transcription with the patient, who gave verbal consent to proceed.  History of Present Illness   ADALEEN HULGAN is a 68 year old female who presents with constipation and bowel habit changes.  She  experiences persistent constipation, stating she 'stays constipated all the time, mostly.' She uses MiraLAX almost every other day to aid bowel movements, but bowel movements remain infrequent.  She feels bloated and gassy much of the time. She has a history of hemorrhoids diagnosed last year, which was associated with episodes of rectal bleeding. No family history of colon cancer.  She has a history of dysphagia due to dysfunction of the swallow muscles, which has worsened over time. Previous swallow studies have confirmed this dysfunction.  She has ongoing severe breathing problems despite completing a course of antibiotics and steroids about a month ago.  Unfortunately, she continues to smoke. She has difficulty walking due to leg problems and typically uses a wheelchair at home. She lives with her husband, son, daughter-in-law, and grandson.    ROS:  Review of Systems  Constitutional:  Positive for fatigue. Negative for appetite change and unexpected weight change.  HENT:  Negative for mouth sores and voice change.   Eyes:  Negative for pain and redness.  Respiratory:  Positive for cough and shortness of breath.   Cardiovascular:  Negative for chest pain and palpitations.  Genitourinary:  Negative for dysuria and hematuria.  Musculoskeletal:  Positive for arthralgias and back pain. Negative for myalgias.  Skin:  Negative for pallor and rash.  Neurological:  Negative for weakness and headaches.  Hematological:  Negative for adenopathy.     Past Medical History: Past Medical History:  Diagnosis Date   Allergic rhinitis    Anxiety    Arthritis    "hands and legs and feet" (04/27/2012)   Asthma    Cancer (HCC) 2005   rt arm mole rem cancer   CHF (congestive heart failure) (HCC)    Chronic bronchitis (HCC)    "keep it all the time" (04/27/2012)   Chronic kidney disease    Chronic kidney disease (CKD)    Stage III   COPD (chronic obstructive pulmonary disease) (HCC)    COVID-19  virus infection    03-2021   Depression    PT DENIES   Diverticulosis    DJD (degenerative joint disease)    Emphysema    Epilepsy (HCC)    Epilepsy (HCC)    Esophageal stricture    Exertional dyspnea    GERD (gastroesophageal reflux disease)    Glaucoma    H/O blood clots    Right leg   Heart failure (HCC)    History of COVID-19 03/2021   History of kidney stones    History of nuclear stress test    Myoview 12/25/22: EF 74, no ischemia or infarction, low risk   Hyperlipidemia    Hypertension    IBS (irritable bowel syndrome)    Migraine    Neck pain    Cervical disc   OSA on CPAP    6-7 yrs; pt wears CPAP but has not been able to use it lately due to sinus issues   Oxygen deficiency    USES 3 LITERS 02 CONTINOUS   Pneumonia 2012; 04/27/2012   Recurrent upper respiratory infection (URI)    Right leg DVT (HCC) 1982   groin   Seizures (HCC) 06/2023   Sleep apnea    Type II diabetes mellitus (HCC)    No meds since weight loss   UTI (urinary tract infection)    2 weeks ago   Vertigo 10/24/2014     Past Surgical History: Past Surgical History:  Procedure Laterality Date   ABDOMINAL HYSTERECTOMY  2001   "partial"   BIOPSY  04/16/2021   Procedure: BIOPSY;  Surgeon: Napoleon Form, MD;  Location: [facility];  Service: Endoscopy;;   CARPAL TUNNEL RELEASE  ~ 2008   right   CARPAL TUNNEL RELEASE Right    CATARACT EXTRACTION Bilateral    COLONOSCOPY     COLONOSCOPY WITH ESOPHAGOGASTRODUODENOSCOPY (EGD)     CYSTOSCOPY/URETEROSCOPY/HOLMIUM LASER/STENT PLACEMENT Left 01/27/2023   Procedure: CYSTOSCOPY LEFT RETROGRADE PYELOGRAM, LEFT URETEROSCOPY, HOLMIUM LASER LITHOTRIPSY, AND LEFT URETERAL STENT PLACEMENT;  Surgeon: Bjorn Pippin, MD;  Location: [facility];  Service: Urology;  Laterality: Left;  60 MINUTES   ESOPHAGOGASTRODUODENOSCOPY (EGD) WITH PROPOFOL N/A 04/16/2021   Procedure: ESOPHAGOGASTRODUODENOSCOPY (EGD) WITH PROPOFOL;  Surgeon: Napoleon Form, MD;  Location:  [facility];  Service: Endoscopy;  Laterality: N/A;   KNEE ARTHROSCOPY  1990's   left   NASAL SEPTOPLASTY W/ TURBINOPLASTY Bilateral 05/23/2016   Procedure: NASAL SEPTOPLASTY WITH TURBINATE REDUCTION;  Surgeon: Osborn Coho, MD;  [facility];  Service: ENT;  Laterality: Bilateral;   SHOULDER OPEN ROTATOR CUFF REPAIR  ~ 2010   left   SINOSCOPY     SINUS ENDO WITH FUSION Right 05/23/2016   Procedure: LIMITED RIGHT ENDOSCOPIC SINUS SURGERY;  Surgeon: Osborn Coho, MD;  Location: [facility] OR;  Service: ENT;  Laterality: Right;  TOTAL KNEE ARTHROPLASTY  08/23/2012   right   TOTAL KNEE ARTHROPLASTY  08/23/2012   Procedure: TOTAL KNEE ARTHROPLASTY;  Surgeon: Nilda Simmer, MD;  Location: [facility] OR;  Service: Orthopedics;  Laterality: Right;  RIGHT KNEE ARTHROPLASTY MEDIAL AND LATERAL COMPARTMENTS WITH PATELLA RESURFACING   TUBAL LIGATION  2001   UPPER GASTROINTESTINAL ENDOSCOPY       Family History: Family History  Problem Relation Age of Onset   Diabetes Mother    Lung cancer Mother        was a smoker   Stroke Mother    Alcohol abuse Father    Lung cancer Father        was a smoker   Emphysema Father        was a smoker   Heart disease Sister    Other Sister        blood clotting disorder   Diabetes Brother    Seizures Brother    Diabetes Maternal Aunt    Breast cancer Maternal Aunt    Esophageal cancer Maternal Uncle    Diabetes Maternal Uncle    Heart disease Maternal Grandfather    Diabetes Maternal Grandfather    Bipolar disorder Son    Muscular dystrophy Son    Heart disease Son    Heart disease Other        uncle   Colon cancer Neg Hx    Pancreatic cancer Neg Hx    Stomach cancer Neg Hx    Colon polyps Neg Hx    Rectal cancer Neg Hx     Social History: Social History   Socioeconomic History   Marital status: Married    Spouse name: Westly Pam   Number of children: 2   Years of education: 12   Highest education level: Not on file  Occupational History    Occupation: disabled  Tobacco Use   Smoking status: Every Day    Current packs/day: 0.25    Average packs/day: 0.5 packs/day for 46.6 years (23.0 ttl pk-yrs)    Types: Cigarettes    Start date: 08/12/1975    Last attempt to quit: 12/30/2020   Smokeless tobacco: Never   Tobacco comments:    Smoking "a few cigarettes per day".  Trying to quit.  01/13/2023  Vaping Use   Vaping status: Never Used  Substance and Sexual Activity   Alcohol use: No    Alcohol/week: 0.0 standard drinks of alcohol   Drug use: No   Sexual activity: Never  Other Topics Concern   Not on file  Social History Narrative   Patient lives at home with husband Westly Pam, her son and his wife.    Patient has 2 children.    Patient has a high school education.    Patient is currently unemployed.    Patient is right handed.    Patient drinks 2 cups of caffeine per day.   Social Drivers of Corporate investment banker Strain: Low Risk  (05/06/2023)   Received from [facility]   Overall Financial Resource Strain (CARDIA)    Difficulty of Paying Living Expenses: Not hard at all  Food Insecurity: No Food Insecurity (09/16/2023)   Received from [facility]   Hunger Vital Sign    Worried About Running Out of Food in the Last Year: Never true    Ran Out of Food in the Last Year: Never true  Transportation Needs: No Transportation Needs (09/16/2023)   Received from [facility]  PRAPARE - Administrator, Civil Service (Medical): No    Lack of Transportation (Non-Medical): No  Physical Activity: Inactive (05/06/2023)   Received from [facility]   Exercise Vital Sign    Days of Exercise per Week: 0 days    Minutes of Exercise per Session: 0 min  Stress: No Stress Concern Present (09/16/2023)   Received from [facility] of Occupational Health - Occupational Stress Questionnaire    Feeling of Stress : Not at all  Social Connections: Socially Integrated (05/06/2023)   Received from  [facility]   Social Connection and Isolation Panel [NHANES]    Frequency of Communication with Friends and Family: More than three times a week    Frequency of Social Gatherings with Friends and Family: Three times a week    Attends Religious Services: 1 to 4 times per year    Active Member of Clubs or Organizations: No    Attends Banker Meetings: 1 to 4 times per year    Marital Status: Married    Allergies: Allergies  Allergen Reactions   Amoxicillin Anaphylaxis    Breakout, mouth swells   Azithromycin Swelling and Rash    "swelling all over, hives, thrush"   Cefdinir     Throat swelling after 3rd dose   Chantix [Varenicline] Other (See Comments)    "bad dreams, difficulty breathing"   Clindamycin Anaphylaxis   Doxycycline Hyclate Anaphylaxis and Rash    swelling   Levofloxacin Anaphylaxis    Pt can take moxifloxacin   Paxlovid [Nirmatrelvir-Ritonavir] Swelling    Throat swelling   Penicillins Anaphylaxis, Shortness Of Breath and Rash    "ALMOST DIED, ended up in hospital"    Sulfonamide Derivatives Shortness Of Breath, Swelling and Rash    "break out from head to toe"   Toradol [Ketorolac Tromethamine] Other (See Comments)    Seizure    Clarithromycin Hives    swelling   Nortriptyline Swelling, Rash and Other (See Comments)    Per patient had "kidney problems" on this medication, could not use bathroom (urinary retention)   Codeine Nausea And Vomiting   Fluticasone-Salmeterol Other (See Comments)    REACTION: ulcers in mouth   Triptans Rash    Mouth breaks out and start itching    Outpatient Meds: Current Outpatient Medications  Medication Sig Dispense Refill   albuterol (PROAIR HFA) 108 (90 Base) MCG/ACT inhaler Inhale 2 puffs into the lungs every 6 (six) hours as needed for shortness of breath or wheezing. 18 g 6   albuterol (PROVENTIL) (2.5 MG/3ML) 0.083% nebulizer solution USE 1 VIAL IN NEBULIZER EVERY 6 HOURS AS NEEDED FOR SHORTNESS OF  BREATH (Patient taking differently: Take 2.5 mg by nebulization every 6 (six) hours as needed for wheezing or shortness of breath.) 75 mL 8   allopurinol (ZYLOPRIM) 100 MG tablet Take 100 mg by mouth daily.     ALPRAZolam (XANAX) 0.25 MG tablet Take 0.25 mg by mouth 2 (two) times daily.     Azelastine HCl (ASTEPRO) 0.15 % SOLN Place 1 spray into the nose 2 (two) times daily as needed (Allergies). 30 mL 5   Cholecalciferol (VITAMIN D3) 50 MCG (2000 UT) capsule TAKE ONE CAPSULE BY MOUTH DAILY (Patient taking differently: Take 2,000 Units by mouth daily.) 90 capsule 1   ciprofloxacin (CIPRO) 500 MG tablet Take 1 tablet (500 mg total) by mouth 2 (two) times daily. 20 tablet 0   famotidine (PEPCID) 40  MG tablet Take 40 mg by mouth at bedtime.     Fluticasone-Umeclidin-Vilant (TRELEGY ELLIPTA) 200-62.5-25 MCG/ACT AEPB Inhale 1 puff into the lungs daily. 60 each 4   gabapentin (NEURONTIN) 100 MG capsule Take 1 capsule (100 mg total) by mouth at bedtime. (Patient taking differently: Take 200 mg by mouth 3 (three) times daily.) 90 capsule 3   guaiFENesin (MUCINEX) 600 MG 12 hr tablet Take 2 tablets (1,200 mg total) by mouth 2 (two) times daily as needed. (Patient taking differently: Take 1,200 mg by mouth 2 (two) times daily as needed for cough or to loosen phlegm.) 60 tablet 5   ipratropium-albuterol (DUONEB) 0.5-2.5 (3) MG/3ML SOLN Take 3 mLs by nebulization every 6 (six) hours as needed (Wheezing/sob).     loratadine (ALLERGY RELIEF) 10 MG tablet Take 1 tablet (10 mg total) by mouth daily. 90 tablet 3   metoCLOPramide (REGLAN) 5 MG tablet Take 1 tablet (5 mg total) by mouth 4 (four) times daily. 2 tablet 0   montelukast (SINGULAIR) 10 MG tablet TAKE ONE TABLET BY MOUTH AT BEDTIME 30 tablet 5   nicotine (NICODERM CQ - DOSED IN MG/24 HOURS) 14 mg/24hr patch Place 1 patch (14 mg total) onto the skin daily. 28 patch 0   ondansetron (ZOFRAN) 4 MG tablet Take 1 tablet (4 mg total) by mouth daily as needed for  nausea or vomiting. 30 tablet 1   oxyCODONE-acetaminophen (PERCOCET/ROXICET) 5-325 MG tablet Take 1 tablet by mouth every 6 (six) hours as needed for severe pain. 15 tablet 0   OXYGEN Inhale 3 L into the lungs continuous.     pantoprazole (PROTONIX) 40 MG tablet Take 1 tablet (40 mg total) by mouth 2 (two) times daily. **PLEASE CALL OFFICE TO SCHEDULE FOLLOW UP 60 tablet 0   polyethylene glycol (GOLYTELY) 236 g solution Take 4,000 mLs by mouth once for 1 dose. 4000 mL 0   predniSONE (DELTASONE) 10 MG tablet Take 4 tabs for 2 days, then 3 tabs for 2 days, 2 tabs for 2 days, then 1 tab for 2 days, then stop. 20 tablet 0   predniSONE (DELTASONE) 10 MG tablet Take  4 each am x 2 days,   2 each am x 2 days,  1 each am x 2 days and stop 14 tablet 0   QUEtiapine (SEROQUEL) 100 MG tablet TAKE ONE TABLET BY MOUTH AT BEDTIME 30 tablet 0   simvastatin (ZOCOR) 40 MG tablet TAKE ONE TABLET BY MOUTH AT BEDTIME (Patient taking differently: Take 40 mg by mouth daily at 6 PM.) 90 tablet 2   topiramate (TOPAMAX) 100 MG tablet TAKE TWO TABLETS BY MOUTH TWICE DAILY 360 tablet 2   traMADol (ULTRAM) 50 MG tablet Take 1 tablet (50 mg total) by mouth every 6 (six) hours as needed. 30 tablet 0   triamcinolone cream (KENALOG) 0.1 % Apply 1 Application topically daily as needed (irritation).     No current facility-administered medications for this visit.      ___________________________________________________________________ Objective   Exam:  BP 130/76   Pulse 80   Ht 5' (1.524 m)   Wt 191 lb (86.6 kg)   BMI 37.30 kg/m  Wt Readings from Last 3 Encounters:  10/13/23 191 lb (86.6 kg)  10/02/23 187 lb (84.8 kg)  09/10/23 185 lb (83.9 kg)    General: Chronically ill-appearing woman in wheelchair, able to get on the exam table with assistance.  Smells of cigarettes.  Gravelly vocal quality Eyes: sclera anicteric, no redness ENT:  oral mucosa moist without lesions, no cervical or supraclavicular  lymphadenopathy CV: Regular without appreciable murmur, no JVD, no peripheral edema Resp: Globally decreased breath sounds bilaterally, no wheezing on exam. GI: soft, no tenderness, with active bowel sounds. No guarding or palpable organomegaly noted. Skin; warm and dry, no rash or jaundice noted Neuro: awake, alert and oriented x 3. Normal gross motor function and fluent speech Antalgic gait  Labs:  09/16/2023 primary care labs (referenced above)  WBC 8.3, hemoglobin 10.9 (MCV 100.9), platelets 179  Sodium 147, chloride 110, BUN 26, creatinine 1.0, potassium 4  Alkaline phosphatase 121, total bilirubin 0.3, AST 10, ALT 16, albumin 3.5  Iron 52, ferritin 42    Encounter Diagnoses  Name Primary?   Heme positive stool Yes   Chronic constipation    Abdominal bloating    Pharyngeal dysphagia     Assessment and Plan    Hemorrhoids Diagnosed last year, likely cause of hematochezia. - Recommend colonoscopy to evaluate current status.  Chronic constipation Infrequent bowel movements despite regular use of polyethylene glycol.  Multifactorial medical conditions, decreased mobility, perhaps some med side effects including opioid. - Increase dietary fiber intake. -Take MiraLAX about every other day, should increase to every diarrhea up to twice a day if needed.  Dysphagia Progressive dysphagia due to swallowing muscle dysfunction, confirmed by previous swallow studies. - Refer to speech therapy for swallowing exercises. - Consider repeat swallow study if symptoms worsen.  Heme positive stool on testing done by primary care.  Her anemia is macrocytic with normal iron levels, so that of the positive stool seem likely unrelated to each other.  Nevertheless, since she has now had a positive FOBT, a colonoscopy is warranted.  I have made it clear to her that she is at decidedly increased sedation risk related to her poorly controlled asthma/COPD.  She also uses supplemental oxygen at  nighttime. Procedure needs to be done in the hospital endoscopy lab as a result.  She was agreeable after discussion of procedure and risks.  The benefits and risks of the planned procedure(s) were described in detail with the patient or (when appropriate) their health care proxy.  Risks were outlined as including, but not limited to, bleeding, infection, perforation, adverse medication reaction leading to cardiac or pulmonary decompensation, pancreatitis (if ERCP).  The limitation of incomplete mucosal visualization was also discussed.  No guarantees or warranties were given.  Bowel prep will be Dulcolax/split dose GoLytely and 2 doses of metoclopramide to help reduce nausea and tolerate the prep better.   40 minutes were spent on this encounter (including chart review, history/exam, counseling/coordination of care, and documentation) > 50% of that time was spent on counseling and coordination of care.   Thank you for the courtesy of this consult.  Please call me with any questions or concerns.  Charlie Pitter III  CC: Referring provider noted above

## 2023-10-14 ENCOUNTER — Encounter: Payer: 59 | Admitting: Gastroenterology

## 2023-10-21 NOTE — Progress Notes (Signed)
 Subjective:   Patient ID: [name], female    DOB: 09/26/1955 MRN: [id number]  HPI  89  yowf active smoker with dx of copd aware of sob around 2000 [facility] with exacerbation every few months just disharged from [facility] in 04/2012 for ? CAP with aecopd referred by Dr [name] for preop for R TKR Jan 13 14   06/18/2012 Pulmonary preop cc doe x one aisle and since last flare using inhaler saba maybe twice weekly and sleeping ok on cpap maintained on spiriva and can't take advair due to mouth irritation.  Min am cough/ congestion with thick white mucus.  rec Try to minimize smoking Try tudorza one puff twice daily instead of spiriva on trial basis for 2 weeks - call if you like it better and we'll send you a prescription       05/12/2016  f/u ov/[name] re:  preop eval for sinus surgery / no med calendar  Chief Complaint  Patient presents with   Follow-up    Pt. says she is not having any sob, coughing with green phlegm, no chest pain  very poor insight into meds / still smoking/ main problem is am cough/ congestion not using mucinex / flutter as rec  Doe = MMRC2 = can't walk a nl pace on a flat grade s sob but does fine slow and flat eg shopping Rec  Work on inhaler technique:  relax and gently blow all the way out then take a nice smooth deep breath back in, triggering the inhaler at same time you start breathing in.  Hold for up to 5 seconds if you can. Blow out thru nose. Rinse and gargle with water when done Plan A = Automatic = dulera 200 Take 2 puffs first thing in am and then another 2 puffs about 12 hours later.  Plan B = Backup Only use your albuterol (proair) as a rescue medication  Plan C = Crisis - only use your albuterol nebulizer if you first try Plan B and it fails to help > ok to use the nebulizer up to every 4 hours but if start needing it regularly call for immediate appointment Pulmonary follow up is as needed - bring your medications with you    [facility] Admit: Admit  date: 09/18/2022 Discharge date: 09/21/2022 Pneumonia of both lungs due to infectious organism COPD (chronic obstructive pulmonary disease) (CMS-HCC) Chronic diastolic heart failure (CMS-HCC) Pulmonary hypertension (CMS-HCC) HLD (hyperlipidemia) Seizure disorder (CMS-HCC) Altered mental status Tobacco dependence 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 female with PMHx as reviewed in the EMR that presented to [facility] and is being admitted for Pneumonia of both lungs due to infectious organism. Patient brought to the ED by EMS today after being found unresponsive by her family. Complains of cough and shortness of breath. On arrival patient's pO2 was 49. Patient also notes she has been having difficulty urinating the past couple of days with scant urine. Wears oxygen at home, 3 L. CPAP at night.Past medical history includes COPD, diastolic heart failure, "bad knees", chronic neck and back pain, home oxygen use, GERD, seizure disorder, hyperlipidemia. [facility]CP is [name]. She was recently hospitalized at  [facility] 12/26 through 12/29 with pneumonia. Today's findings include elevated WBC 19.2, creatinine 1.23, pO2 49. Chest x-ray  c/w chronic pulmonary infiltrates, unchanged. CTA of the chest shows multifocal alveolar process likely infectious. Larger area of alveolar consolidation in the left upper lobe. Nonspecific hilar and mediastinal adenopathy. Viral testing negative. Temperature 103.7, tachycardic 120s. Patient has clinically improved, has been treated with IV antibiotics including vancomycin, and aztreonam. Patient has been evaluated by PT, recommended SNF, however  patient would like to be discharged home states that her husband and daughter can care for her.  Detailed hospital close/management mentioned below:  Pneumonia of both lungs due to infectious organism, leukocytosis, COPD, sleep apnea Clinically improving, speaking complete sentences. WBC was trended down to 7.8. Blood cultures x 2 no growth 48 hours. Continue vancomycin and aztreonam that were initiated in the [place] for pulmonary toiletry. Will discharge on clindamycin 450 mg every 8 hours x 5 days. Oxygen support, has been refusing CPAP at night Nebulizers every 6 hours  Chronic diastolic heart failure Monitor IV fluid quantities Monitor for fluid overload Echocardiogram 1/23 normal EF 60 to 65% with G1 DD She is not on home diuretics  Left lower extremity DVT Continue Xarelto Doppler 2/9 confirms DVT in the left common femoral vein Will need outpt Vascular Clinic Referral       09/25/2022  re-establish s/p Admit: f/u ov/[place] office/[name] re: GOLD 3 copd / 02 dep and still smoking  maint on telegy  / DNI status per last admit.  Chief Complaint  Patient presents with   Hospitalization Follow-up    HFU [facility] 2/8 pneumonia and sepsis  AP 12/26-12/29 COPD    Dyspnea:  unsteady on feet/ clot L leg x one year and leg stayed swollen since   Cough: slt slt cream  Sleeping: hosp bed 10 degrees  SABA use: way too much 02: 3lpm 24/7  Rec Plan A = Automatic = Always=    Trelegy 100 one click each am   Plan B = Backup (to supplement plan A, not to replace it) Only use your albuterol inhaler as a rescue medication Plan C = Crisis (instead of Plan B but only if Plan B stops working) - only use your albuterol nebulizer if you first try Plan B and it fails to help > ok to use the nebulizer up to every 4 hours but if start needing it regularly call for immediate appointment Also  Ok to try albuterol 15 min before an activity (on alternating days)  that you know would usually make you  short of breath and see if it makes any difference and if makes none then don't take albuterol after activity unless you can't catch your breath as this means it's the resting that helps, not the albuterol. After use of Trelegy use arm and hammer tooth paste Call your pharmacist to fine out what the antibiotic was that could tolerate that actually worked  Make sure you check your oxygen saturation  AT  your highest level of activity (not after you stop)   to be sure it stays over 90%  The key is to stop smoking completely before smoking completely stops you! For cough/ congestion > mucinex 1200 mg every 12hours as need and flutter valve as much as possible  and if can't stop coughing try to switch entirely to the nebulizer up to every 4 hours as needed    06/24/2023  [place] pt  re-establishing  ov/ office/Mick Tanguma re: GOLD 3 copd/02 dep  maint on Trelegy   did not go for fu cxr on last SOOD  ov 02/16/23  Chief Complaint  Patient presents with   COPD GOLD 3  Dyspnea:  limited by L knee to motorized w/c Cough: worse cough since 06/22/23 > green sputum Sleeping: hob up 30 degrees cough x sev days acutely - was not having trouble prior to flare SABA use: twice daily  02: 3lpm bed time and prn daytime  Rec Cipro 500 mg twice daily x  10days  Prednisone 10 mg take  4 each am x 2 days,   2 each am x 2 days,  1 each am x 2 days and stop  Plan A = Automatic = Always=    Trelegy 100  Plan B = Backup (to supplement plan A, not to replace it) Only use your albuterol inhaler as a rescue medication  Plan C = Crisis (instead of Plan B but only if Plan B stops working) - only use your albuterol nebulizer if you first try Plan B Make sure you check your oxygen saturation  AT  your highest level of activity (not after you stop)   to be sure it stays over 90%         09/10/2023  ACUTE  ov/[place] office/Medford Staheli re: GOLD 3 copd  maint on trelegy 100 and 02 3lpm   Chief Complaint  Patient presents with    Acute Visit    Pt is having sob and coughing mucus   Dyspnea:  worse x 4 days  Cough: yellow dark thick  Sleeping:  30 degrees  only needed to use rescue x one  SABA use: last neb 3 h   02: 3lpm  24 7  Lung cancer screening: today  Rec Cipro 500 mg twice daily x  10days  Prednisone 10 mg take  4 each am x 2 days,   2 each am x 2 days,  1 each am x 2 days and stop  Plan A = Automatic = Always=    Trelegy 100  Plan B = Backup (to supplement plan A, not to replace it) Only use your albuterol inhaler as a rescue medication   Plan C = Crisis (instead of Plan B but only if Plan B stops working) - only use your albuterol nebulizer if you first try Plan B and it fails to help > ok to use the nebulizer up to every 4 hours but if start needing it regularly call for immediate appointment Make sure you check your oxygen saturation  AT  your highest level of activity (not after you stop)   to be sure it stays over 90%   You need to be off cigarettes x 2 weeks and cxr needs to be cleared before I can clear you surgery    Please schedule a follow up office visit in 6 weeks, call sooner if needed  - to see nurse practitioner   10/22/2023  f/u ov/ office/Lillyonna Armstead re: GOLD 3 copd/ maint on trelegy   Chief Complaint  Patient presents with   Follow-up    6 week follow up    Dyspnea: can't walk due to L Knee pain  Cough: none  Sleeping: 30 degrees Hosp bed s  resp cc  SABA use: twice daily  hfa/ neb rarely  02: 3lpm at / none at rest/ 3lpm continuous   Lung cancer screening: last CTa 09/18/22 s nodules    No obvious day to day or daytime variability or assoc  excess/ purulent sputum or mucus plugs or hemoptysis or cp or chest tightness, subjective wheeze or overt sinus or hb symptoms.    Also denies any obvious fluctuation of symptoms with weather or environmental changes or other aggravating or alleviating factors except as outlined above   No unusual exposure hx or h/o childhood pna/  asthma or knowledge of premature birth.  Current Allergies, Complete Past Medical History, Past Surgical History, Family History, and Social History were reviewed in [employer] record.  ROS  The following are not active complaints unless bolded Hoarseness, sore throat, dysphagia, dental problems, itching, sneezing,  nasal congestion or discharge of excess mucus or purulent secretions, ear ache,   fever, chills, sweats, unintended wt loss or wt gain, classically pleuritic or exertional cp,  orthopnea pnd or arm/hand swelling  or leg swelling, presyncope, palpitations, abdominal pain, anorexia, nausea, vomiting, diarrhea  or change in bowel habits or change in bladder habits, change in stools or change in urine, dysuria, hematuria,  rash, arthralgias, visual complaints, headache, numbness, weakness or ataxia or problems with walking or coordination,  change in [name] or  memory.        Current Meds  Medication Sig   albuterol (PROAIR HFA) 108 (90 Base) MCG/ACT inhaler Inhale 2 puffs into the lungs every 6 (six) hours as needed for shortness of breath or wheezing.   albuterol (PROVENTIL) (2.5 MG/3ML) 0.083% nebulizer solution USE 1 VIAL IN NEBULIZER EVERY 6 HOURS AS NEEDED FOR SHORTNESS OF BREATH (Patient taking differently: Take 2.5 mg by nebulization every 6 (six) hours as needed for wheezing or shortness of breath.)   allopurinol (ZYLOPRIM) 100 MG tablet Take 100 mg by mouth daily.   ALPRAZolam (XANAX) 0.25 MG tablet Take 0.25 mg by mouth 2 (two) times daily.   Azelastine HCl (ASTEPRO) 0.15 % SOLN Place 1 spray into the nose 2 (two) times daily as needed (Allergies).   Cholecalciferol (VITAMIN D3) 50 MCG (2000 UT) capsule TAKE ONE CAPSULE BY MOUTH DAILY (Patient taking differently: Take 2,000 Units by mouth daily.)   famotidine (PEPCID) 40 MG tablet Take 40 mg by mouth at bedtime.   Fluticasone-Umeclidin-Vilant (TRELEGY ELLIPTA) 200-62.5-25 MCG/ACT AEPB Inhale 1 puff into the  lungs daily.   gabapentin (NEURONTIN) 100 MG capsule Take 1 capsule (100 mg total) by mouth at bedtime. (Patient taking differently: Take 200 mg by mouth 3 (three) times daily.)   guaiFENesin (MUCINEX) 600 MG 12 hr tablet Take 2 tablets (1,200 mg total) by mouth 2 (two) times daily as needed. (Patient taking differently: Take 1,200 mg by mouth 2 (two) times daily as needed for cough or to loosen phlegm.)   ipratropium-albuterol (DUONEB) 0.5-2.5 (3) MG/3ML SOLN Take 3 mLs by nebulization every 6 (six) hours as needed (Wheezing/sob).   loratadine (ALLERGY RELIEF) 10 MG tablet Take 1 tablet (10 mg total) by mouth daily.   metoCLOPramide (REGLAN) 5 MG tablet Take 1 tablet (5 mg total) by mouth every 12 (twelve) hours as needed for up to 2 doses for nausea. Take 30-45 minutes before evening and AM doses of bowel preparation solution.   montelukast (SINGULAIR) 10 MG tablet TAKE ONE TABLET BY MOUTH AT BEDTIME   nicotine (NICODERM CQ - DOSED IN MG/24 HOURS) 14 mg/24hr patch Place 1 patch (14 mg total) onto the skin daily.   ondansetron (ZOFRAN) 4 MG tablet Take 1 tablet (4 mg total) by mouth daily as needed for nausea or vomiting.   oxyCODONE-acetaminophen (PERCOCET/ROXICET) 5-325 MG tablet Take 1  tablet by mouth every 6 (six) hours as needed for severe pain.   OXYGEN Inhale 3 L into the lungs continuous.   pantoprazole (PROTONIX) 40 MG tablet Take 1 tablet (40 mg total) by mouth 2 (two) times daily. **PLEASE CALL OFFICE TO SCHEDULE FOLLOW UP   predniSONE (DELTASONE) 10 MG tablet Take  4 each am x 2 days,   2 each am x 2 days,  1 each am x 2 days and stop   QUEtiapine (SEROQUEL) 100 MG tablet TAKE ONE TABLET BY MOUTH AT BEDTIME   simvastatin (ZOCOR) 40 MG tablet TAKE ONE TABLET BY MOUTH AT BEDTIME (Patient taking differently: Take 40 mg by mouth daily at 6 PM.)   topiramate (TOPAMAX) 100 MG tablet TAKE TWO TABLETS BY MOUTH TWICE DAILY   traMADol (ULTRAM) 50 MG tablet Take 1 tablet (50 mg total) by mouth  every 6 (six) hours as needed.   triamcinolone cream (KENALOG) 0.1 % Apply 1 Application topically daily as needed (irritation).                   Objective:   Physical Exam  Wts  10/22/2023       191  09/10/2023       185 06/24/2023     180  09/25/22 174 lb (78.9 kg)  08/07/22 177 lb 14.6 oz (80.7 kg)  07/10/22 176 lb (79.8 kg)    Vital signs reviewed  10/22/2023  - Note at rest 02 sats  100% on 3lpm cont    General appearance:    w/c bound elderly wf nad /slt hoarse /mild smoker's rattle    HEENT : Oropharynx  clear   Nasal turbinates nl    NECK :  without  apparent JVD/ palpable Nodes/TM    LUNGS: no acc muscle use,  Mild barrel  contour chest wall with bilateral minimal isp/exp rhonchi  and  without cough on insp or exp maneuvers  and mild  Hyperresonant  to  percussion bilaterally     CV:  RRR  no s3 or murmur or increase in P2, and no edema   ABD:  soft and nontender with pos end  insp Hoover's  in the supine position.  No bruits or organomegaly appreciated   MS:  Nl gait/ ext warm without deformities Or obvious joint restrictions  calf tenderness, cyanosis or clubbing     SKIN: warm and dry without lesions    NEURO:  alert, approp, nl sensorium with  no motor or cerebellar deficits apparent.        Assessment & Plan:

## 2023-10-22 ENCOUNTER — Ambulatory Visit: Payer: 59 | Admitting: Internal Medicine

## 2023-10-22 ENCOUNTER — Encounter: Payer: Self-pay | Admitting: Internal Medicine

## 2023-10-22 VITALS — BP 114/70 | HR 79 | Wt 191.0 lb

## 2023-10-22 DIAGNOSIS — J449 Chronic obstructive pulmonary disease, unspecified: Secondary | ICD-10-CM

## 2023-10-22 DIAGNOSIS — Z9981 Dependence on supplemental oxygen: Secondary | ICD-10-CM

## 2023-10-22 DIAGNOSIS — J9611 Chronic respiratory failure with hypoxia: Secondary | ICD-10-CM

## 2023-10-22 DIAGNOSIS — F17210 Nicotine dependence, cigarettes, uncomplicated: Secondary | ICD-10-CM | POA: Diagnosis not present

## 2023-10-22 DIAGNOSIS — Z01811 Encounter for preprocedural respiratory examination: Secondary | ICD-10-CM

## 2023-10-22 NOTE — Patient Instructions (Addendum)
 No smoking between now and surgery   Make sure you check your oxygen saturation  AT  your highest level of activity (not after you stop)   to be sure it stays over 90% and adjust  02 flow upward to maintain this level if needed but remember to turn it back to previous settings when you stop (to conserve your supply).   You are cleared for surgery - need to focus on post op mobilization and minimize use of pain medications    Please schedule a follow up visit in 3 months but call sooner if needed

## 2023-10-23 ENCOUNTER — Other Ambulatory Visit: Payer: Self-pay | Admitting: Neurology

## 2023-10-24 NOTE — Assessment & Plan Note (Addendum)
 Active smoker  - as of 09/25/22 on 3lpm 247 with nl HC03 but hct 28%     Lab Results  Component Value Date   HGB 9.4 (L) 02/06/2023   HGB 9.2 (L) 02/05/2023   HGB 10.2 (L) 02/04/2023   HGB 10.1 (L) 05/24/2021   HGB 11.6 04/10/2020   HGB 11.8 04/05/2019     Adequate control on present rx, reviewed in detail with pt > no change in rx needed    Rec Make sure you check your oxygen saturation  AT  your highest level of activity (not after you stop)   to be sure it stays over 90% and adjust  02 flow upward to maintain this level if needed but remember to turn it back to previous settings when you stop (to conserve your supply).          Each maintenance medication was reviewed in detail including emphasizing most importantly the difference between maintenance and prns and under what circumstances the prns are to be triggered using an action plan format where appropriate.  Total time for H and P, chart review, counseling, reviewing dpi/ hfa/neb/ 02 / pulse ox  device(s) and generating customized AVS unique to this office visit / same day charting = 22 min

## 2023-10-24 NOTE — Assessment & Plan Note (Signed)
 Active smoker  -  PFT's  09/27/21  FEV1 1.05 (47 % ) ratio 0.58  p 5 % improvement from saba p ? prior to study with DLCO  9.41 (52%)   and FV curve mildly concave and ERV 51 at wt 189      Group D (now reclassified as E) in terms of symptom/risk and laba/lama/ICS  therefore appropriate rx at this point >>>  trelegy and approp saba @ present   There is no "red line" in clearing pts for knee surgery in terms of FEV1 and she's  probably in as good as shape as she can be in given ongoing smoking so just needs to quit if possible for 2 weeks prior to surgery and go ahead with procedure emphasizing early mobilization and min analgesics to promote more effective cough post op with post op pulmonary input available if done in [facility].  Discussed in detail all the  indications, usual  risks and alternatives  relative to the benefits with patient who agrees to proceed with Rx as outlined.      F/u in 3 m, sooner prn

## 2023-10-24 NOTE — Assessment & Plan Note (Signed)
4-5 min discussion re active cigarette smoking in addition to office E&M  Ask about tobacco use:   ongoing Advise quitting   I took an extended  opportunity with this patient to outline the consequences of continued cigarette use  in airway disorders based on all the data we have from the multiple national lung health studies (perfomed over decades at millions of dollars in cost)  indicating that smoking cessation, not choice of inhalers or pulmonary  physicians, is the most important aspect of her  care.   Assess willingness:  Not committed at this point Assist in quit attempt:  Per PCP when ready Arrange follow up:   Follow up per Primary Care planned

## 2023-10-27 ENCOUNTER — Encounter: Payer: 59 | Admitting: Gastroenterology

## 2023-11-02 ENCOUNTER — Ambulatory Visit: Payer: 59 | Admitting: Urology

## 2023-11-09 NOTE — Progress Notes (Deleted)
 Name: [name] DOB: 1956-04-24 MRN: [id number]  History of Present Illness: [name] is a 68 y.o. female who presents today for follow up visit at [facility] [place]. ***She is accompanied by ***. GU History includes: 1. Recurrent UTIs (resolved***).  2. Kidney stones. - 01/27/2023: Underwent left ureteroscopic stone manipulation by Dr. Annabell Howells. - 02/19/2023: Larina Bras analysis showed 90% calcium oxalate and 10% hydroxyapatite composition.  - 03/12/2023: Normal RUS - no stones, masses, or hydronephrosis.    At last visit on 05/05/2023: Doing well.   Today: KUB today: Awaiting radiology read; *** appreciated per provider interpretation.  She {Actions; denies-reports:120008} recent stone passage. She {Actions; denies-reports:120008} flank pain or abdominal pain. She {Actions; denies-reports:120008} fevers, nausea, or vomiting.  She {Actions; denies-reports:120008} increased urinary urgency, frequency, nocturia, dysuria, gross hematuria, hesitancy, straining to void, or sensations of incomplete emptying.  She reports *** UTIs in the past 12 months.  Medications: Current Outpatient Medications  Medication Sig Dispense Refill   albuterol (PROAIR HFA) 108 (90 Base) MCG/ACT inhaler Inhale 2 puffs into the lungs every 6 (six) hours as needed for shortness of breath or wheezing. 18 g 6   albuterol (PROVENTIL) (2.5 MG/3ML) 0.083% nebulizer solution USE 1 VIAL IN NEBULIZER EVERY 6 HOURS AS NEEDED FOR SHORTNESS OF BREATH (Patient taking differently: Take 2.5 mg by nebulization every 6 (six) hours as needed for wheezing or shortness of breath.) 75 mL 8   allopurinol (ZYLOPRIM) 100 MG tablet Take 100 mg by mouth daily.     ALPRAZolam (XANAX) 0.25 MG tablet Take 0.25 mg by mouth 2 (two) times daily.     Azelastine HCl (ASTEPRO) 0.15 % SOLN Place 1 spray into the nose 2 (two) times daily as needed (Allergies). 30 mL 5   Cholecalciferol (VITAMIN D3) 50 MCG (2000 UT) capsule TAKE ONE CAPSULE  BY MOUTH DAILY (Patient taking differently: Take 2,000 Units by mouth daily.) 90 capsule 1   ciprofloxacin (CIPRO) 500 MG tablet Take 1 tablet (500 mg total) by mouth 2 (two) times daily. (Patient not taking: Reported on 10/22/2023) 20 tablet 0   famotidine (PEPCID) 40 MG tablet Take 40 mg by mouth at bedtime.     Fluticasone-Umeclidin-Vilant (TRELEGY ELLIPTA) 200-62.5-25 MCG/ACT AEPB Inhale 1 puff into the lungs daily. 60 each 4   gabapentin (NEURONTIN) 100 MG capsule Take 1 capsule (100 mg total) by mouth at bedtime. (Patient taking differently: Take 200 mg by mouth 3 (three) times daily.) 90 capsule 3   guaiFENesin (MUCINEX) 600 MG 12 hr tablet Take 2 tablets (1,200 mg total) by mouth 2 (two) times daily as needed. (Patient taking differently: Take 1,200 mg by mouth 2 (two) times daily as needed for cough or to loosen phlegm.) 60 tablet 5   ipratropium-albuterol (DUONEB) 0.5-2.5 (3) MG/3ML SOLN Take 3 mLs by nebulization every 6 (six) hours as needed (Wheezing/sob).     loratadine (ALLERGY RELIEF) 10 MG tablet Take 1 tablet (10 mg total) by mouth daily. 90 tablet 3   metoCLOPramide (REGLAN) 5 MG tablet Take 1 tablet (5 mg total) by mouth every 12 (twelve) hours as needed for up to 2 doses for nausea. Take 30-45 minutes before evening and AM doses of bowel preparation solution. 2 tablet 0   montelukast (SINGULAIR) 10 MG tablet TAKE ONE TABLET BY MOUTH AT BEDTIME 30 tablet 5   nicotine (NICODERM CQ - DOSED IN MG/24 HOURS) 14 mg/24hr patch Place 1 patch (14 mg total) onto the skin daily. 28 patch 0  ondansetron (ZOFRAN) 4 MG tablet Take 1 tablet (4 mg total) by mouth daily as needed for nausea or vomiting. 30 tablet 1   oxyCODONE-acetaminophen (PERCOCET/ROXICET) 5-325 MG tablet Take 1 tablet by mouth every 6 (six) hours as needed for severe pain. 15 tablet 0   OXYGEN Inhale 3 L into the lungs continuous.     pantoprazole (PROTONIX) 40 MG tablet Take 1 tablet (40 mg total) by mouth 2 (two) times daily.  **PLEASE CALL OFFICE TO SCHEDULE FOLLOW UP 60 tablet 0   predniSONE (DELTASONE) 10 MG tablet Take 4 tabs for 2 days, then 3 tabs for 2 days, 2 tabs for 2 days, then 1 tab for 2 days, then stop. (Patient not taking: Reported on 10/22/2023) 20 tablet 0   predniSONE (DELTASONE) 10 MG tablet Take  4 each am x 2 days,   2 each am x 2 days,  1 each am x 2 days and stop 14 tablet 0   QUEtiapine (SEROQUEL) 100 MG tablet TAKE ONE TABLET BY MOUTH AT BEDTIME 30 tablet 0   simvastatin (ZOCOR) 40 MG tablet TAKE ONE TABLET BY MOUTH AT BEDTIME (Patient taking differently: Take 40 mg by mouth daily at 6 PM.) 90 tablet 2   topiramate (TOPAMAX) 100 MG tablet TAKE TWO TABLETS BY MOUTH TWICE DAILY 360 tablet 2   traMADol (ULTRAM) 50 MG tablet Take 1 tablet (50 mg total) by mouth every 6 (six) hours as needed. 30 tablet 0   triamcinolone cream (KENALOG) 0.1 % Apply 1 Application topically daily as needed (irritation).     No current facility-administered medications for this visit.    Allergies: Allergies  Allergen Reactions   Amoxicillin Anaphylaxis    Breakout, mouth swells   Azithromycin Swelling and Rash    "swelling all over, hives, thrush"   Cefdinir     Throat swelling after 3rd dose   Chantix [Varenicline] Other (See Comments)    "bad dreams, difficulty breathing"   Clindamycin Anaphylaxis   Doxycycline Hyclate Anaphylaxis and Rash    swelling   Levofloxacin Anaphylaxis    Pt can take moxifloxacin   Paxlovid [Nirmatrelvir-Ritonavir] Swelling    Throat swelling   Penicillins Anaphylaxis, Shortness Of Breath and Rash    "ALMOST DIED, ended up in hospital"    Sulfonamide Derivatives Shortness Of Breath, Swelling and Rash    "break out from head to toe"   Toradol [Ketorolac Tromethamine] Other (See Comments)    Seizure    Clarithromycin Hives    swelling   Nortriptyline Swelling, Rash and Other (See Comments)    Per patient had "kidney problems" on this medication, could not use bathroom  (urinary retention)   Codeine Nausea And Vomiting   Fluticasone-Salmeterol Other (See Comments)    REACTION: ulcers in mouth   Triptans Rash    Mouth breaks out and start itching    Past Medical History:  Diagnosis Date   Allergic rhinitis    Anxiety    Arthritis    "hands and legs and feet" (04/27/2012)   Asthma    Cancer (HCC) 2005   rt arm mole rem cancer   CHF (congestive heart failure) (HCC)    Chronic bronchitis (HCC)    "keep it all the time" (04/27/2012)   Chronic kidney disease    Chronic kidney disease (CKD)    Stage III   COPD (chronic obstructive pulmonary disease) (HCC)    COVID-19 virus infection    03-2021   Depression    PT  DENIES   Diverticulosis    DJD (degenerative joint disease)    Emphysema    Epilepsy (HCC)    Epilepsy (HCC)    Esophageal stricture    Exertional dyspnea    GERD (gastroesophageal reflux disease)    Glaucoma    H/O blood clots    Right leg   Heart failure (HCC)    History of COVID-19 03/2021   History of kidney stones    History of nuclear stress test    Myoview 12/25/22: EF 74, no ischemia or infarction, low risk   Hyperlipidemia    Hypertension    IBS (irritable bowel syndrome)    Migraine    Neck pain    Cervical disc   OSA on CPAP    6-7 yrs; pt wears CPAP but has not been able to use it lately due to sinus issues   Oxygen deficiency    USES 3 LITERS 02 CONTINOUS   Pneumonia 2012; 04/27/2012   Recurrent upper respiratory infection (URI)    Right leg DVT (HCC) 1982   groin   Seizures (HCC) 06/2023   Sleep apnea    Type II diabetes mellitus (HCC)    No meds since weight loss   UTI (urinary tract infection)    2 weeks ago   Vertigo 10/24/2014   Past Surgical History:  Procedure Laterality Date   ABDOMINAL HYSTERECTOMY  2001   "partial"   BIOPSY  04/16/2021   Procedure: BIOPSY;  Surgeon: Napoleon Form, MD;  Location: [facility];  Service: Endoscopy;;   CARPAL TUNNEL RELEASE  ~ 2008   right   CARPAL  TUNNEL RELEASE Right    CATARACT EXTRACTION Bilateral    COLONOSCOPY     COLONOSCOPY WITH ESOPHAGOGASTRODUODENOSCOPY (EGD)     CYSTOSCOPY/URETEROSCOPY/HOLMIUM LASER/STENT PLACEMENT Left 01/27/2023   Procedure: CYSTOSCOPY LEFT RETROGRADE PYELOGRAM, LEFT URETEROSCOPY, HOLMIUM LASER LITHOTRIPSY, AND LEFT URETERAL STENT PLACEMENT;  Surgeon: Bjorn Pippin, MD;  Location: [facility];  Service: Urology;  Laterality: Left;  60 MINUTES   ESOPHAGOGASTRODUODENOSCOPY (EGD) WITH PROPOFOL N/A 04/16/2021   Procedure: ESOPHAGOGASTRODUODENOSCOPY (EGD) WITH PROPOFOL;  Surgeon: Napoleon Form, MD;  Location: [facility];  Service: Endoscopy;  Laterality: N/A;   KNEE ARTHROSCOPY  1990's   left   NASAL SEPTOPLASTY W/ TURBINOPLASTY Bilateral 05/23/2016   Procedure: NASAL SEPTOPLASTY WITH TURBINATE REDUCTION;  Surgeon: Osborn Coho, MD;  Location[facility] OR;  Service: ENT;  Laterality: Bilateral;   SHOULDER OPEN ROTATOR CUFF REPAIR  ~ 2010   left   SINOSCOPY     SINUS ENDO WITH FUSION Right 05/23/2016   Procedure: LIMITED RIGHT ENDOSCOPIC SINUS SURGERY;  Surgeon: Osborn Coho, MD;  Location: [facility] OR;  Service: ENT;  Laterality: Right;   TOTAL KNEE ARTHROPLASTY  08/23/2012   right   TOTAL KNEE ARTHROPLASTY  08/23/2012   Procedure: TOTAL KNEE ARTHROPLASTY;  Surgeon: Nilda Simmer, MD;  Location: [facility] OR;  Service: Orthopedics;  Laterality: Right;  RIGHT KNEE ARTHROPLASTY MEDIAL AND LATERAL COMPARTMENTS WITH PATELLA RESURFACING   TUBAL LIGATION  2001   UPPER GASTROINTESTINAL ENDOSCOPY     Family History  Problem Relation Age of Onset   Diabetes Mother    Lung cancer Mother        was a smoker   Stroke Mother    Alcohol abuse Father    Lung cancer Father        was a smoker   Emphysema Father        was a smoker  Heart disease Sister    Other Sister        blood clotting disorder   Diabetes Brother    Seizures Brother    Diabetes Maternal Aunt    Breast cancer Maternal Aunt    Esophageal cancer  Maternal Uncle    Diabetes Maternal Uncle    Heart disease Maternal Grandfather    Diabetes Maternal Grandfather    Bipolar disorder Son    Muscular dystrophy Son    Heart disease Son    Heart disease Other        uncle   Colon cancer Neg Hx    Pancreatic cancer Neg Hx    Stomach cancer Neg Hx    Colon polyps Neg Hx    Rectal cancer Neg Hx    Social History   Socioeconomic History   Marital status: Married    Spouse name: Westly Pam   Number of children: 2   Years of education: 12   Highest education level: Not on file  Occupational History   Occupation: disabled  Tobacco Use   Smoking status: Every Day    Current packs/day: 0.25    Average packs/day: 0.5 packs/day for 46.6 years (23.0 ttl pk-yrs)    Types: Cigarettes    Start date: 08/12/1975    Last attempt to quit: 12/30/2020   Smokeless tobacco: Never   Tobacco comments:    Smoking "a few cigarettes per day".  Trying to quit.  01/13/2023  Vaping Use   Vaping status: Never Used  Substance and Sexual Activity   Alcohol use: No    Alcohol/week: 0.0 standard drinks of alcohol   Drug use: No   Sexual activity: Never  Other Topics Concern   Not on file  Social History Narrative   Patient lives at home with husband Westly Pam, her son and his wife.    Patient has 2 children.    Patient has a high school education.    Patient is currently unemployed.    Patient is right handed.    Patient drinks 2 cups of caffeine per day.   Social Drivers of Corporate investment banker Strain: Low Risk  (05/06/2023)   Received from [facility]   Overall Financial Resource Strain (CARDIA)    Difficulty of Paying Living Expenses: Not hard at all  Food Insecurity: No Food Insecurity (09/16/2023)   Received from [facility]   Hunger Vital Sign    Worried About Running Out of Food in the Last Year: Never true    Ran Out of Food in the Last Year: Never true  Transportation Needs: No Transportation Needs (09/16/2023)   Received from [facility] - Transportation    Lack of Transportation (Medical): No    Lack of Transportation (Non-Medical): No  Physical Activity: Inactive (05/06/2023)   Received from [facility]   Exercise Vital Sign    Days of Exercise per Week: 0 days    Minutes of Exercise per Session: 0 min  Stress: No Stress Concern Present (09/16/2023)   Received from [facility] - Occupational Stress Questionnaire    Feeling of Stress : Not at all  Social Connections: Socially Integrated (05/06/2023)   Received from [facility]   Social Connection and Isolation Panel [NHANES]    Frequency of Communication with Friends and Family: More than three times a week    Frequency of Social Gatherings with Friends and  Family: Three times a week    Attends Religious Services: 1 to 4 times per year    Active Member of Clubs or Organizations: No    Attends Banker Meetings: 1 to 4 times per year    Marital Status: Married  Catering manager Violence: Not At Risk (05/06/2023)   Received from [facility]   Humiliation, Afraid, Rape, and Kick questionnaire    Fear of Current or Ex-Partner: No    Emotionally Abused: No    Physically Abused: No    Sexually Abused: No    SUBJECTIVE  Review of Systems Constitutional: Patient denies any unintentional weight loss or change in strength lntegumentary: Patient denies any rashes or pruritus Cardiovascular: Patient denies chest pain or syncope Respiratory: Patient denies shortness of breath Gastrointestinal: ***Patient {Actions; denies-reports:120008} ***nausea, ***vomiting, ***constipation, ***diarrhea ***As per HPI Musculoskeletal: Patient denies muscle cramps or weakness Neurologic: Patient denies convulsions or seizures Allergic/Immunologic: Patient denies recent allergic reaction(s) Hematologic/Lymphatic: Patient denies bleeding tendencies Endocrine: Patient denies heat/cold intolerance  GU: As  per HPI.  OBJECTIVE There were no vitals filed for this visit. There is no height or weight on file to calculate BMI.  Physical Examination Constitutional: No obvious distress; patient is non-toxic appearing  Cardiovascular: No visible lower extremity edema.  Respiratory: The patient does not have audible wheezing/stridor; respirations do not appear labored  Gastrointestinal: Abdomen non-distended Musculoskeletal: Normal ROM of UEs  Skin: No obvious rashes/open sores  Neurologic: CN 2-12 grossly intact Psychiatric: Answered questions appropriately with normal affect  Hematologic/Lymphatic/Immunologic: No obvious bruises or sites of spontaneous bleeding  UA: ***negative ***positive for *** leukocytes, *** blood, ***nitrites Urine microscopy: *** WBC/hpf, *** RBC/hpf, *** bacteria ***otherwise unremarkable ***glucosuria (secondary to ***Jardiance ***Farxiga use)  PVR: *** ml  ASSESSMENT No diagnosis found.  ***We reviewed recent imaging results; ***awaiting radiology results, appears to have ***no acute findings per provider interpretation.  ***For stone prevention: Advised adequate hydration and we discussed option to consider low oxalate diet given that calcium oxalate is the most common type of stone. Handout provided about stone prevention diet.  ***For recurrent stone formers: We discussed option to proceed with 24 hour urinalysis ([facility]) for metabolic stone evaluation, which may help with targeted recommendations for dietary I medication therapies for stone prevention. Patient elected to ***proceed/ ***hold off.  Will plan to follow up in ***6 months / ***1 year with ***KUB ***RUS for stone surveillance or sooner if needed.  Patient verbalized understanding of and agreement with current plan. All questions were answered.  PLAN Advised the following: Maintain adequate fluid intake daily. Drink citrus juice (lemon, lime or orange juice) routinely. Low oxalate diet. No  follow-ups on file.  No orders of the defined types were placed in this encounter.   It has been explained that the patient is to follow regularly with their PCP in addition to all other providers involved in their care and to follow instructions provided by these respective offices. Patient advised to contact urology clinic if any urologic-pertaining questions, concerns, new symptoms or problems arise in the interim period.  There are no Patient Instructions on file for this visit.  Electronically signed by:  Donnita Falls, MSN, FNP-C, CUNP 11/09/2023 1:26 PM

## 2023-11-10 NOTE — Progress Notes (Deleted)
 Name: [name] DOB: 11-01-1955 MRN: [id number]  History of Present Illness: [name] is a 68 y.o. female who presents today for follow up visit at [facility]. ***She is accompanied by ***. GU History includes: 1. Recurrent UTIs (resolved***).  2. Kidney stones. - 01/27/2023: Underwent left ureteroscopic stone manipulation by Dr. Annabell Howells. - 02/19/2023: [name] analysis showed 90% calcium oxalate and 10% hydroxyapatite composition.  - 03/12/2023: Normal RUS - no stones, masses, or hydronephrosis.    At last visit on 05/05/2023: Doing well.   Today: KUB today: Awaiting radiology read; *** appreciated per provider interpretation.  She {Actions; denies-reports:120008} recent stone passage. She {Actions; denies-reports:120008} flank pain or abdominal pain. She {Actions; denies-reports:120008} fevers, nausea, or vomiting.  She {Actions; denies-reports:120008} increased urinary urgency, frequency, nocturia, dysuria, gross hematuria, hesitancy, straining to void, or sensations of incomplete emptying.  She reports *** UTIs in the past 12 months.  Medications: Current Outpatient Medications  Medication Sig Dispense Refill   albuterol (PROAIR HFA) 108 (90 Base) MCG/ACT inhaler Inhale 2 puffs into the lungs every 6 (six) hours as needed for shortness of breath or wheezing. 18 g 6   albuterol (PROVENTIL) (2.5 MG/3ML) 0.083% nebulizer solution USE 1 VIAL IN NEBULIZER EVERY 6 HOURS AS NEEDED FOR SHORTNESS OF BREATH (Patient taking differently: Take 2.5 mg by nebulization every 6 (six) hours as needed for wheezing or shortness of breath.) 75 mL 8   allopurinol (ZYLOPRIM) 100 MG tablet Take 100 mg by mouth daily.     ALPRAZolam (XANAX) 0.25 MG tablet Take 0.25 mg by mouth 2 (two) times daily.     Azelastine HCl (ASTEPRO) 0.15 % SOLN Place 1 spray into the nose 2 (two) times daily as needed (Allergies). 30 mL 5   Cholecalciferol (VITAMIN D3) 50 MCG (2000 UT) capsule TAKE ONE CAPSULE  BY MOUTH DAILY (Patient taking differently: Take 2,000 Units by mouth daily.) 90 capsule 1   ciprofloxacin (CIPRO) 500 MG tablet Take 1 tablet (500 mg total) by mouth 2 (two) times daily. (Patient not taking: Reported on 10/22/2023) 20 tablet 0   famotidine (PEPCID) 40 MG tablet Take 40 mg by mouth at bedtime.     Fluticasone-Umeclidin-Vilant (TRELEGY ELLIPTA) 200-62.5-25 MCG/ACT AEPB Inhale 1 puff into the lungs daily. 60 each 4   gabapentin (NEURONTIN) 100 MG capsule Take 1 capsule (100 mg total) by mouth at bedtime. (Patient taking differently: Take 200 mg by mouth 3 (three) times daily.) 90 capsule 3   guaiFENesin (MUCINEX) 600 MG 12 hr tablet Take 2 tablets (1,200 mg total) by mouth 2 (two) times daily as needed. (Patient taking differently: Take 1,200 mg by mouth 2 (two) times daily as needed for cough or to loosen phlegm.) 60 tablet 5   ipratropium-albuterol (DUONEB) 0.5-2.5 (3) MG/3ML SOLN Take 3 mLs by nebulization every 6 (six) hours as needed (Wheezing/sob).     loratadine (ALLERGY RELIEF) 10 MG tablet Take 1 tablet (10 mg total) by mouth daily. 90 tablet 3   metoCLOPramide (REGLAN) 5 MG tablet Take 1 tablet (5 mg total) by mouth every 12 (twelve) hours as needed for up to 2 doses for nausea. Take 30-45 minutes before evening and AM doses of bowel preparation solution. 2 tablet 0   montelukast (SINGULAIR) 10 MG tablet TAKE ONE TABLET BY MOUTH AT BEDTIME 30 tablet 5   nicotine (NICODERM CQ - DOSED IN MG/24 HOURS) 14 mg/24hr patch Place 1 patch (14 mg total) onto the skin daily. 28 patch 0  ondansetron (ZOFRAN) 4 MG tablet Take 1 tablet (4 mg total) by mouth daily as needed for nausea or vomiting. 30 tablet 1   oxyCODONE-acetaminophen (PERCOCET/ROXICET) 5-325 MG tablet Take 1 tablet by mouth every 6 (six) hours as needed for severe pain. 15 tablet 0   OXYGEN Inhale 3 L into the lungs continuous.     pantoprazole (PROTONIX) 40 MG tablet Take 1 tablet (40 mg total) by mouth 2 (two) times daily.  **PLEASE CALL OFFICE TO SCHEDULE FOLLOW UP 60 tablet 0   predniSONE (DELTASONE) 10 MG tablet Take 4 tabs for 2 days, then 3 tabs for 2 days, 2 tabs for 2 days, then 1 tab for 2 days, then stop. (Patient not taking: Reported on 10/22/2023) 20 tablet 0   predniSONE (DELTASONE) 10 MG tablet Take  4 each am x 2 days,   2 each am x 2 days,  1 each am x 2 days and stop 14 tablet 0   QUEtiapine (SEROQUEL) 100 MG tablet TAKE ONE TABLET BY MOUTH AT BEDTIME 30 tablet 0   simvastatin (ZOCOR) 40 MG tablet TAKE ONE TABLET BY MOUTH AT BEDTIME (Patient taking differently: Take 40 mg by mouth daily at 6 PM.) 90 tablet 2   topiramate (TOPAMAX) 100 MG tablet TAKE TWO TABLETS BY MOUTH TWICE DAILY 360 tablet 2   traMADol (ULTRAM) 50 MG tablet Take 1 tablet (50 mg total) by mouth every 6 (six) hours as needed. 30 tablet 0   triamcinolone cream (KENALOG) 0.1 % Apply 1 Application topically daily as needed (irritation).     No current facility-administered medications for this visit.    Allergies: Allergies  Allergen Reactions   Amoxicillin Anaphylaxis    Breakout, mouth swells   Azithromycin Swelling and Rash    "swelling all over, hives, thrush"   Cefdinir     Throat swelling after 3rd dose   Chantix [Varenicline] Other (See Comments)    "bad dreams, difficulty breathing"   Clindamycin Anaphylaxis   Doxycycline Hyclate Anaphylaxis and Rash    swelling   Levofloxacin Anaphylaxis    Pt can take moxifloxacin   Paxlovid [Nirmatrelvir-Ritonavir] Swelling    Throat swelling   Penicillins Anaphylaxis, Shortness Of Breath and Rash    "ALMOST DIED, ended up in hospital"    Sulfonamide Derivatives Shortness Of Breath, Swelling and Rash    "break out from head to toe"   Toradol [Ketorolac Tromethamine] Other (See Comments)    Seizure    Clarithromycin Hives    swelling   Nortriptyline Swelling, Rash and Other (See Comments)    Per patient had "kidney problems" on this medication, could not use bathroom  (urinary retention)   Codeine Nausea And Vomiting   Fluticasone-Salmeterol Other (See Comments)    REACTION: ulcers in mouth   Triptans Rash    Mouth breaks out and start itching    Past Medical History:  Diagnosis Date   Allergic rhinitis    Anxiety    Arthritis    "hands and legs and feet" (04/27/2012)   Asthma    Cancer (HCC) 2005   rt arm mole rem cancer   CHF (congestive heart failure) (HCC)    Chronic bronchitis (HCC)    "keep it all the time" (04/27/2012)   Chronic kidney disease    Chronic kidney disease (CKD)    Stage III   COPD (chronic obstructive pulmonary disease) (HCC)    COVID-19 virus infection    03-2021   Depression    PT  DENIES   Diverticulosis    DJD (degenerative joint disease)    Emphysema    Epilepsy (HCC)    Epilepsy (HCC)    Esophageal stricture    Exertional dyspnea    GERD (gastroesophageal reflux disease)    Glaucoma    H/O blood clots    Right leg   Heart failure (HCC)    History of COVID-19 03/2021   History of kidney stones    History of nuclear stress test    Myoview 12/25/22: EF 74, no ischemia or infarction, low risk   Hyperlipidemia    Hypertension    IBS (irritable bowel syndrome)    Migraine    Neck pain    Cervical disc   OSA on CPAP    6-7 yrs; pt wears CPAP but has not been able to use it lately due to sinus issues   Oxygen deficiency    USES 3 LITERS 02 CONTINOUS   Pneumonia 2012; 04/27/2012   Recurrent upper respiratory infection (URI)    Right leg DVT (HCC) 1982   groin   Seizures (HCC) 06/2023   Sleep apnea    Type II diabetes mellitus (HCC)    No meds since weight loss   UTI (urinary tract infection)    2 weeks ago   Vertigo 10/24/2014   Past Surgical History:  Procedure Laterality Date   ABDOMINAL HYSTERECTOMY  2001   "partial"   BIOPSY  04/16/2021   Procedure: BIOPSY;  Surgeon: Napoleon Form, MD;  Location: [facility];  Service: Endoscopy;;   CARPAL TUNNEL RELEASE  ~ 2008   right   CARPAL  TUNNEL RELEASE Right    CATARACT EXTRACTION Bilateral    COLONOSCOPY     COLONOSCOPY WITH ESOPHAGOGASTRODUODENOSCOPY (EGD)     CYSTOSCOPY/URETEROSCOPY/HOLMIUM LASER/STENT PLACEMENT Left 01/27/2023   Procedure: CYSTOSCOPY LEFT RETROGRADE PYELOGRAM, LEFT URETEROSCOPY, HOLMIUM LASER LITHOTRIPSY, AND LEFT URETERAL STENT PLACEMENT;  Surgeon: Bjorn Pippin, MD;  Location: [facility];  Service: Urology;  Laterality: Left;  60 MINUTES   ESOPHAGOGASTRODUODENOSCOPY (EGD) WITH PROPOFOL N/A 04/16/2021   Procedure: ESOPHAGOGASTRODUODENOSCOPY (EGD) WITH PROPOFOL;  Surgeon: Napoleon Form, MD;  Location: [facility];  Service: Endoscopy;  Laterality: N/A;   KNEE ARTHROSCOPY  1990's   left   NASAL SEPTOPLASTY W/ TURBINOPLASTY Bilateral 05/23/2016   Procedure: NASAL SEPTOPLASTY WITH TURBINATE REDUCTION;  Surgeon: Osborn Coho, MD;  Location[facility] OR;  Service: ENT;  Laterality: Bilateral;   SHOULDER OPEN ROTATOR CUFF REPAIR  ~ 2010   left   SINOSCOPY     SINUS ENDO WITH FUSION Right 05/23/2016   Procedure: LIMITED RIGHT ENDOSCOPIC SINUS SURGERY;  Surgeon: Osborn Coho, MD;  Location: [facility] OR;  Service: ENT;  Laterality: Right;   TOTAL KNEE ARTHROPLASTY  08/23/2012   right   TOTAL KNEE ARTHROPLASTY  08/23/2012   Procedure: TOTAL KNEE ARTHROPLASTY;  Surgeon: Nilda Simmer, MD;  Location: [facility] OR;  Service: Orthopedics;  Laterality: Right;  RIGHT KNEE ARTHROPLASTY MEDIAL AND LATERAL COMPARTMENTS WITH PATELLA RESURFACING   TUBAL LIGATION  2001   UPPER GASTROINTESTINAL ENDOSCOPY     Family History  Problem Relation Age of Onset   Diabetes Mother    Lung cancer Mother        was a smoker   Stroke Mother    Alcohol abuse Father    Lung cancer Father        was a smoker   Emphysema Father        was a smoker  Heart disease Sister    Other Sister        blood clotting disorder   Diabetes Brother    Seizures Brother    Diabetes Maternal Aunt    Breast cancer Maternal Aunt    Esophageal cancer  Maternal Uncle    Diabetes Maternal Uncle    Heart disease Maternal Grandfather    Diabetes Maternal Grandfather    Bipolar disorder Son    Muscular dystrophy Son    Heart disease Son    Heart disease Other        uncle   Colon cancer Neg Hx    Pancreatic cancer Neg Hx    Stomach cancer Neg Hx    Colon polyps Neg Hx    Rectal cancer Neg Hx    Social History   Socioeconomic History   Marital status: Married    Spouse name: Westly Pam   Number of children: 2   Years of education: 12   Highest education level: Not on file  Occupational History   Occupation: disabled  Tobacco Use   Smoking status: Every Day    Current packs/day: 0.25    Average packs/day: 0.5 packs/day for 46.6 years (23.0 ttl pk-yrs)    Types: Cigarettes    Start date: 08/12/1975    Last attempt to quit: 12/30/2020   Smokeless tobacco: Never   Tobacco comments:    Smoking "a few cigarettes per day".  Trying to quit.  01/13/2023  Vaping Use   Vaping status: Never Used  Substance and Sexual Activity   Alcohol use: No    Alcohol/week: 0.0 standard drinks of alcohol   Drug use: No   Sexual activity: Never  Other Topics Concern   Not on file  Social History Narrative   Patient lives at home with husband Westly Pam, her son and his wife.    Patient has 2 children.    Patient has a high school education.    Patient is currently unemployed.    Patient is right handed.    Patient drinks 2 cups of caffeine per day.   Social Drivers of Corporate investment banker Strain: Low Risk  (05/06/2023)   Received from [facility]   Overall Financial Resource Strain (CARDIA)    Difficulty of Paying Living Expenses: Not hard at all  Food Insecurity: No Food Insecurity (09/16/2023)   Received from [facility]   Hunger Vital Sign    Worried About Running Out of Food in the Last Year: Never true    Ran Out of Food in the Last Year: Never true  Transportation Needs: No Transportation Needs (09/16/2023)   Received from [facility] - Transportation    Lack of Transportation (Medical): No    Lack of Transportation (Non-Medical): No  Physical Activity: Inactive (05/06/2023)   Received from [facility]   Exercise Vital Sign    Days of Exercise per Week: 0 days    Minutes of Exercise per Session: 0 min  Stress: No Stress Concern Present (09/16/2023)   Received from [facility] - Occupational Stress Questionnaire    Feeling of Stress : Not at all  Social Connections: Socially Integrated (05/06/2023)   Received from [facility]   Social Connection and Isolation Panel [NHANES]    Frequency of Communication with Friends and Family: More than three times a week    Frequency of Social Gatherings with Friends and  Family: Three times a week    Attends Religious Services: 1 to 4 times per year    Active Member of Clubs or Organizations: No    Attends Banker Meetings: 1 to 4 times per year    Marital Status: Married  Catering manager Violence: Not At Risk (05/06/2023)   Received from [facility]   Humiliation, Afraid, Rape, and Kick questionnaire    Fear of Current or Ex-Partner: No    Emotionally Abused: No    Physically Abused: No    Sexually Abused: No    SUBJECTIVE  Review of Systems Constitutional: Patient denies any unintentional weight loss or change in strength lntegumentary: Patient denies any rashes or pruritus Cardiovascular: Patient denies chest pain or syncope Respiratory: Patient denies shortness of breath Gastrointestinal: ***Patient {Actions; denies-reports:120008} ***nausea, ***vomiting, ***constipation, ***diarrhea ***As per HPI Musculoskeletal: Patient denies muscle cramps or weakness Neurologic: Patient denies convulsions or seizures Allergic/Immunologic: Patient denies recent allergic reaction(s) Hematologic/Lymphatic: Patient denies bleeding tendencies Endocrine: Patient denies heat/cold intolerance  GU: As  per HPI.  OBJECTIVE There were no vitals filed for this visit. There is no height or weight on file to calculate BMI.  Physical Examination Constitutional: No obvious distress; patient is non-toxic appearing  Cardiovascular: No visible lower extremity edema.  Respiratory: The patient does not have audible wheezing/stridor; respirations do not appear labored  Gastrointestinal: Abdomen non-distended Musculoskeletal: Normal ROM of UEs  Skin: No obvious rashes/open sores  Neurologic: CN 2-12 grossly intact Psychiatric: Answered questions appropriately with normal affect  Hematologic/Lymphatic/Immunologic: No obvious bruises or sites of spontaneous bleeding  UA: ***negative ***positive for *** leukocytes, *** blood, ***nitrites Urine microscopy: *** WBC/hpf, *** RBC/hpf, *** bacteria ***otherwise unremarkable ***glucosuria (secondary to ***Jardiance ***Farxiga use)  PVR: *** ml  ASSESSMENT No diagnosis found.  ***We reviewed recent imaging results; ***awaiting radiology results, appears to have ***no acute findings per provider interpretation.  ***For stone prevention: Advised adequate hydration and we discussed option to consider low oxalate diet given that calcium oxalate is the most common type of stone. Handout provided about stone prevention diet.  ***For recurrent stone formers: We discussed option to proceed with 24 hour urinalysis ([facility]) for metabolic stone evaluation, which may help with targeted recommendations for dietary I medication therapies for stone prevention. Patient elected to ***proceed/ ***hold off.  Will plan to follow up in ***6 months / ***1 year with ***KUB ***RUS for stone surveillance or sooner if needed.  Patient verbalized understanding of and agreement with current plan. All questions were answered.  PLAN Advised the following: Maintain adequate fluid intake daily. Drink citrus juice (lemon, lime or orange juice) routinely. Low oxalate diet. No  follow-ups on file.  No orders of the defined types were placed in this encounter.   It has been explained that the patient is to follow regularly with their PCP in addition to all other providers involved in their care and to follow instructions provided by these respective offices. Patient advised to contact urology clinic if any urologic-pertaining questions, concerns, new symptoms or problems arise in the interim period.  There are no Patient Instructions on file for this visit.  Electronically signed by:  Donnita Falls, MSN, FNP-C, CUNP 11/10/2023 1:22 PM

## 2023-11-11 ENCOUNTER — Ambulatory Visit: Admitting: Urology

## 2023-11-11 DIAGNOSIS — N390 Urinary tract infection, site not specified: Secondary | ICD-10-CM

## 2023-11-11 DIAGNOSIS — N200 Calculus of kidney: Secondary | ICD-10-CM

## 2023-11-19 ENCOUNTER — Other Ambulatory Visit: Payer: Self-pay | Admitting: Neurology

## 2023-11-24 ENCOUNTER — Ambulatory Visit: Admitting: Urology

## 2023-11-24 ENCOUNTER — Other Ambulatory Visit: Payer: Self-pay | Admitting: Acute Care

## 2023-11-24 ENCOUNTER — Ambulatory Visit: Payer: Self-pay | Admitting: Internal Medicine

## 2023-11-24 DIAGNOSIS — N200 Calculus of kidney: Secondary | ICD-10-CM

## 2023-11-24 DIAGNOSIS — R0609 Other forms of dyspnea: Secondary | ICD-10-CM

## 2023-11-24 MED ORDER — PREDNISONE 10 MG PO TABS: ORAL_TABLET | ORAL | 0 refills | Status: DC

## 2023-11-24 NOTE — Telephone Encounter (Signed)
 [name], please advise. Dr. Waymond Hailey is unavailable.  No appt available.

## 2023-11-24 NOTE — Telephone Encounter (Signed)
 Copied from CRM [id number]. Topic: Clinical - Red Word Triage >> Nov 24, 2023  2:48 PM Chantha C wrote: Red Word that prompted transfer to Nurse Triage: Patient [phone]  would like Prednisone to be called in for symptoms: wheezing a lot, shortness of breath because of all the pollen. Patient denies dizziness, headaches nor pain.   [id number] [place], [place] - [address] [place] - [address] [place] [address] Phone: [phone] Fax: [phone]  Per agent, pt is wheezing really loud over phone.  TRIAGE SUMMARY NOTE: Pt reporting that for the past 2 days she's been experiencing more SOB than normal, "mostly with moving around," pt describes this exacerbation as "mild" for her, nurse can hear loud inspiratory and expiratory wheezing over the phone. Pt confirms not struggling to breathe, only sometimes speaking in phrases, not coughing or expelling sputum, "feel fine other than" SOB. Pt reporting she is on 3L oxygen continually and not felt need to titrate up, pulse ox today 95%, usual range is 95-100%. Pt reporting that she is using her nebulizer 2-3x/day and albuterol inhaler but not using every 6 hours, pt confirms "little bit" relief from nebulizer and inhaler until this afternoon, not getting relief currently, pt about to do next inhaler dose. Advised pt be examined in next 4 hours, advised use inhaler every 6 hours, advised pt can do rescue treatments of inhaler 2-3x 20 min apart if feel moderate SOB, call back if worsening. Pt verbalized understanding. Pt refusing appt at this time, requesting prednisone. Advised that sending HP message to office for call back to pt with further recommendations. Please advise.  E2C2 Pulmonary Triage - Initial Assessment Questions "Chief Complaint (e.g., cough, sob, wheezing, fever, chills, sweat or additional symptoms) *Go to specific symptom protocol after initial questions. More SOB than normal, mostly with moving around Can abnormal breath  sounds over the phone Loud wheezing both breathing in and out Feel fine other than that No chest pain, dizziness, weakness Sometimes speaking in phrases Think allergies No coughing or coughing anything up Says mild exacerbation for her Not been using inhaler every 6 hours Requesting prednisone  "How long have symptoms been present?" Couple days  "Have you used your inhalers/maintenance medication?" Yes If yes, "What medications?" Nebulizer, albuterol inhaler only when have to have it  If inhaler, ask "How many puffs and how often?" Note: Review instructions on medication in the chart. Nebulizer 3x yesterday, 2x today, used albuterol inhaler 2x today, got little bit relief yesterday and some this morning, not this afternoon  OXYGEN: "Do you wear supplemental oxygen?" Yes If yes, "How many liters are you supposed to use?" 3L all the time, not felt need to go up on oxygen  "Do you monitor your oxygen levels?" Yes If yes, "What is your reading (oxygen level) today?" 95%  "What is your usual oxygen saturation reading?"  (Note: Pulmonary O2 sats should be 90% or greater) 95-100%  Reason for Disposition  [1] Longstanding difficulty breathing (e.g., CHF, COPD, emphysema) AND [2] WORSE than normal  Protocols used: Breathing Difficulty-A-AH

## 2023-11-24 NOTE — Telephone Encounter (Signed)
 Spoke to patient and relayed below message/recommendations.  She stated that she is not allergic to prednisone. She stated that she has taken prednisone previously without any reaction.  [facility]  Isa Manuel, please advise. Thanks

## 2023-11-25 NOTE — Telephone Encounter (Signed)
 Pt is aware and voiced her understanding.  Nothing further needed.

## 2023-12-09 ENCOUNTER — Ambulatory Visit: Admitting: Audiologist

## 2023-12-10 ENCOUNTER — Ambulatory Visit: Attending: Family Medicine | Admitting: Audiologist

## 2023-12-10 DIAGNOSIS — H903 Sensorineural hearing loss, bilateral: Secondary | ICD-10-CM | POA: Insufficient documentation

## 2023-12-10 HISTORY — DX: Sensorineural hearing loss, bilateral: H90.3

## 2023-12-10 NOTE — Procedures (Signed)
  [facility] [address] [place], [place]  [address] [phone]  AUDIOLOGICAL  EVALUATION  NAME: [name]     DOB:   1956-07-26      MRN: [id number]                                                                                     DATE: 12/10/2023     REFERENT: System, Provider Not In STATUS: Outpatient DIAGNOSIS: Sensorineural Hearing Loss Bilateral    History: [name] was seen for an audiological evaluation due to difficulty hearing the TV and her husband. They care arguing daily due ot not hearing each other at home.  [name] denies pain, pressure, or tinnitus. [name] no reported history of hazardous noise exposure. She has never had a hearing test before.  Medical history shows no additional risk for hearing loss.    Evaluation:  Otoscopy showed a clear view of the tympanic membranes, bilaterally Tympanometry results were consistent with normal middle ear function, bilaterally   Audiometric testing was completed using Conventional Audiometry techniques with insert earphones and supraural headphones. Test results are consistent with normal hearing sloping after 2kHz to a moderate sensorineural hearing loss bilaterally. Speech Recognition Thresholds were obtained at 35 dB HL in the right ear and at 30  dB HL in the left ear. Word Recognition Testing was completed at  40dB SL and [name] scored 100% in each ear.    Results:  The test results were reviewed with [name]. She has normal sloping to moderate sensorineural  hearing loss in both ears. Due to her reported daily difficulty hearing, recommend she try hearing aids. She is open to the idea. She can call [facility] to get set up with hearing aid appointment.  Audiogram printed and provided to [name].    Recommendations: Hearing aids recommended for both ears. Patient given handout on how to get set up with [employer]  Annual audiometric testing recommended to monitor hearing  loss for progression.   35 minutes spent testing and counseling on results.   If you have any questions please feel free to contact me at [phone].  [name] Au.D.  Audiologist   12/10/2023  2:35 PM  Cc: System, Provider Not In

## 2023-12-16 ENCOUNTER — Telehealth: Payer: Self-pay | Admitting: Gastroenterology

## 2023-12-16 ENCOUNTER — Ambulatory Visit: Admitting: Neurology

## 2023-12-16 NOTE — Telephone Encounter (Signed)
 Procedure:Colonoscopy Procedure date: 12/24/23 Procedure location: [facility] Arrival Time: 6:45 am Spoke with the patient Y/N: Yes Any prep concerns? No  Has the patient obtained the prep from the pharmacy ? Yes Do you have a care partner and transportation: Yes Any additional concerns? No

## 2023-12-17 ENCOUNTER — Encounter (HOSPITAL_COMMUNITY): Payer: Self-pay

## 2023-12-17 ENCOUNTER — Telehealth: Payer: Self-pay | Admitting: "Endocrinology

## 2023-12-17 NOTE — Telephone Encounter (Signed)
 Pt states she did labs at [facility] on Monday. I do not have those results. Can you get them?

## 2023-12-17 NOTE — Progress Notes (Signed)
 Attempted to obtain medical history for pre op call via telephone, unable to reach at this time. HIPAA compliant voicemail message left requesting return call to pre surgical testing department.

## 2023-12-17 NOTE — Telephone Encounter (Signed)
 [facility] representative stated pt's labs were still pending results.

## 2023-12-18 ENCOUNTER — Ambulatory Visit: Payer: 59 | Admitting: Nurse Practitioner

## 2023-12-18 DIAGNOSIS — R946 Abnormal results of thyroid function studies: Secondary | ICD-10-CM

## 2023-12-18 NOTE — Progress Notes (Signed)
 [name]  PCP- n/a Cardiologist-Berry MD PulmonologistWaymond Hailey MD  Chest-06/24/23 EKG-09/03/21 Echo-09/03/21 Cath-n/a Stress-12/25/22 ICD/PM-n/a Blood thinner-not on, aspirin  but since stopped GLP-1-n/a   Hx- DM- diet controlled, OSA, Asthma, CHF, HTN, COPD, CKD3, Emphysema, Epilepsy, Esophageal stricture, DVT. Pt last saw her pulm MD on 10/22/23 at appt "cleared for surgery" but did call into triage 4/15 said SOB. They recommeneded her to go to urgent card but she refused, has since gotten better not having any more increased SOB, continues on 3L02 which is her normal. Last saw her cardiologist on 05/25/23 was a clearance appt for a different procedure, denies any issues since shes seen them. Uses w/c mostly.  Anesthesia Review- Yes- approved as long as breathing stable.

## 2023-12-20 ENCOUNTER — Other Ambulatory Visit: Payer: Self-pay | Admitting: Neurology

## 2023-12-20 ENCOUNTER — Other Ambulatory Visit: Payer: Self-pay | Admitting: Primary Care

## 2023-12-21 ENCOUNTER — Ambulatory Visit: Payer: 59 | Admitting: Neurology

## 2023-12-22 ENCOUNTER — Ambulatory Visit: Admitting: Urology

## 2023-12-24 ENCOUNTER — Other Ambulatory Visit: Payer: Self-pay

## 2023-12-24 ENCOUNTER — Ambulatory Visit (HOSPITAL_COMMUNITY)
Admission: RE | Admit: 2023-12-24 | Discharge: 2023-12-24 | Disposition: A | Discharge status: Home / Self Care | Attending: Gastroenterology | Admitting: Gastroenterology

## 2023-12-24 ENCOUNTER — Ambulatory Visit (HOSPITAL_COMMUNITY): Admitting: Anesthesiology

## 2023-12-24 ENCOUNTER — Encounter (HOSPITAL_COMMUNITY): Payer: Self-pay | Admitting: Gastroenterology

## 2023-12-24 ENCOUNTER — Encounter (HOSPITAL_COMMUNITY)
Admission: RE | Disposition: A | Discharge status: Home / Self Care | Payer: Self-pay | Source: Home / Self Care | Attending: Gastroenterology

## 2023-12-24 DIAGNOSIS — K5909 Other constipation: Secondary | ICD-10-CM | POA: Diagnosis present

## 2023-12-24 DIAGNOSIS — K5730 Diverticulosis of large intestine without perforation or abscess without bleeding: Secondary | ICD-10-CM | POA: Insufficient documentation

## 2023-12-24 DIAGNOSIS — K625 Hemorrhage of anus and rectum: Secondary | ICD-10-CM | POA: Insufficient documentation

## 2023-12-24 DIAGNOSIS — J449 Chronic obstructive pulmonary disease, unspecified: Secondary | ICD-10-CM

## 2023-12-24 DIAGNOSIS — K589 Irritable bowel syndrome without diarrhea: Secondary | ICD-10-CM | POA: Insufficient documentation

## 2023-12-24 DIAGNOSIS — K5900 Constipation, unspecified: Secondary | ICD-10-CM

## 2023-12-24 DIAGNOSIS — D120 Benign neoplasm of cecum: Secondary | ICD-10-CM

## 2023-12-24 DIAGNOSIS — D122 Benign neoplasm of ascending colon: Secondary | ICD-10-CM

## 2023-12-24 DIAGNOSIS — D125 Benign neoplasm of sigmoid colon: Secondary | ICD-10-CM

## 2023-12-24 DIAGNOSIS — Z1211 Encounter for screening for malignant neoplasm of colon: Secondary | ICD-10-CM

## 2023-12-24 DIAGNOSIS — D539 Nutritional anemia, unspecified: Secondary | ICD-10-CM | POA: Insufficient documentation

## 2023-12-24 DIAGNOSIS — K648 Other hemorrhoids: Secondary | ICD-10-CM | POA: Insufficient documentation

## 2023-12-24 DIAGNOSIS — R140 Abdominal distension (gaseous): Secondary | ICD-10-CM

## 2023-12-24 DIAGNOSIS — Q439 Congenital malformation of intestine, unspecified: Secondary | ICD-10-CM

## 2023-12-24 DIAGNOSIS — R195 Other fecal abnormalities: Secondary | ICD-10-CM | POA: Diagnosis present

## 2023-12-24 DIAGNOSIS — R1313 Dysphagia, pharyngeal phase: Secondary | ICD-10-CM

## 2023-12-24 HISTORY — PX: COLONOSCOPY: SHX5424

## 2023-12-24 SURGERY — COLONOSCOPY
Anesthesia: Monitor Anesthesia Care

## 2023-12-24 MED ORDER — SODIUM CHLORIDE 0.9 % IV SOLN: INTRAVENOUS | Status: DC

## 2023-12-24 MED ORDER — PROPOFOL 500 MG/50ML IV EMUL
INTRAVENOUS | Status: DC | PRN
  Administered 2023-12-24: 175 ug/kg/min via INTRAVENOUS
  Administered 2023-12-24: 20 mg via INTRAVENOUS

## 2023-12-24 NOTE — H&P (Signed)
 History and Physical:  This patient presents for endoscopic testing for: 68 year old woman here today for endoscopic evaluation of constipation rectal bleeding and heme positive stool. Clinical details are in my office consult note dated 10/13/2023, with no significant clinical changes since then. FOBT positive on checked by primary care when patient discovered to have a macrocytic anemia. Procedures being done in the hospital-based endoscopy lab due to the severity of the patient's pulmonary condition.  Patient is otherwise without complaints or active issues today.   Past Medical History: Past Medical History:  Diagnosis Date   Allergic rhinitis    Anxiety    Arthritis    "hands and legs and feet" (04/27/2012)   Asthma    Cancer (HCC) 2005   rt arm mole rem cancer   CHF (congestive heart failure) (HCC)    Chronic bronchitis (HCC)    "keep it all the time" (04/27/2012)   Chronic kidney disease    Chronic kidney disease (CKD)    Stage III   COPD (chronic obstructive pulmonary disease) (HCC)    COVID-19 virus infection    03-2021   Depression    PT DENIES   Diverticulosis    DJD (degenerative joint disease)    Emphysema    Epilepsy (HCC)    Epilepsy (HCC)    Esophageal stricture    Exertional dyspnea    GERD (gastroesophageal reflux disease)    Glaucoma    H/O blood clots    Right leg   Heart failure (HCC)    History of COVID-19 03/2021   History of kidney stones    History of nuclear stress test    Myoview  12/25/22: EF 74, no ischemia or infarction, low risk   Hyperlipidemia    Hypertension    IBS (irritable bowel syndrome)    Migraine    Neck pain    Cervical disc   OSA on CPAP    6-7 yrs; pt wears CPAP but has not been able to use it lately due to sinus issues   Oxygen  deficiency    USES 3 LITERS 02 CONTINOUS   Pneumonia 2012; 04/27/2012   Recurrent upper respiratory infection (URI)    Right leg DVT (HCC) 1982   groin   Seizures (HCC) 06/2023   Sleep apnea     Type II diabetes mellitus (HCC)    No meds since weight loss   UTI (urinary tract infection)    2 weeks ago   Vertigo 10/24/2014     Past Surgical History: Past Surgical History:  Procedure Laterality Date   ABDOMINAL HYSTERECTOMY  2001   "partial"   BIOPSY  04/16/2021   Procedure: BIOPSY;  Surgeon: Sergio Dandy, MD;  Location: [facility];  Service: Endoscopy;;   CARPAL TUNNEL RELEASE  ~ 2008   right   CARPAL TUNNEL RELEASE Right    CATARACT EXTRACTION Bilateral    COLONOSCOPY     COLONOSCOPY WITH ESOPHAGOGASTRODUODENOSCOPY (EGD)     CYSTOSCOPY/URETEROSCOPY/HOLMIUM LASER/STENT PLACEMENT Left 01/27/2023   Procedure: CYSTOSCOPY LEFT RETROGRADE PYELOGRAM, LEFT URETEROSCOPY, HOLMIUM LASER LITHOTRIPSY, AND LEFT URETERAL STENT PLACEMENT;  Surgeon: Homero Luster, MD;  Location: [facility];  Service: Urology;  Laterality: Left;  60 MINUTES   ESOPHAGOGASTRODUODENOSCOPY (EGD) WITH PROPOFOL  N/A 04/16/2021   Procedure: ESOPHAGOGASTRODUODENOSCOPY (EGD) WITH PROPOFOL ;  Surgeon: Sergio Dandy, MD;  Location: [facility];  Service: Endoscopy;  Laterality: N/A;   KNEE ARTHROSCOPY  1990's   left   NASAL SEPTOPLASTY W/ TURBINOPLASTY Bilateral 05/23/2016   Procedure: NASAL  SEPTOPLASTY WITH TURBINATE REDUCTION;  Surgeon: Ammon Bales, MD;  Location: [facility] OR;  Service: ENT;  Laterality: Bilateral;   SHOULDER OPEN ROTATOR CUFF REPAIR  ~ 2010   left   SINOSCOPY     SINUS ENDO WITH FUSION Right 05/23/2016   Procedure: LIMITED RIGHT ENDOSCOPIC SINUS SURGERY;  Surgeon: Ammon Bales, MD;  Location: [facility];  Service: ENT;  Laterality: Right;   TOTAL KNEE ARTHROPLASTY  08/23/2012   right   TOTAL KNEE ARTHROPLASTY  08/23/2012   Procedure: TOTAL KNEE ARTHROPLASTY;  Surgeon: Genevie Kerns, MD;  Location: [facility] OR;  Service: Orthopedics;  Laterality: Right;  RIGHT KNEE ARTHROPLASTY MEDIAL AND LATERAL COMPARTMENTS WITH PATELLA RESURFACING   TUBAL LIGATION  2001   UPPER GASTROINTESTINAL ENDOSCOPY       Allergies: Allergies  Allergen Reactions   Amoxicillin Anaphylaxis    Breakout, mouth swells   Azithromycin Swelling and Rash    "swelling all over, hives, thrush"   Cefdinir      Throat swelling after 3rd dose   Chantix  [Varenicline ] Other (See Comments)    "bad dreams, difficulty breathing"   Clindamycin  Anaphylaxis   Doxycycline  Hyclate Anaphylaxis and Rash    swelling   Levofloxacin  Anaphylaxis    Pt can take moxifloxacin    Paxlovid  [Nirmatrelvir -Ritonavir ] Swelling    Throat swelling   Penicillins Anaphylaxis, Shortness Of Breath and Rash    "ALMOST DIED, ended up in hospital"    Sulfonamide Derivatives Shortness Of Breath, Swelling and Rash    "break out from head to toe"   Toradol  [Ketorolac  Tromethamine ] Other (See Comments)    Seizure    Clarithromycin Hives    swelling   Nortriptyline Swelling, Rash and Other (See Comments)    Per patient had "kidney problems" on this medication, could not use bathroom (urinary retention)   Codeine  Nausea And Vomiting   Fluticasone -Salmeterol Other (See Comments)    REACTION: ulcers in mouth   Triptans Rash    Mouth breaks out and start itching    Outpatient Meds: Current Facility-Administered Medications  Medication Dose Route Frequency Provider Last Rate Last Admin   0.9 %  sodium chloride  infusion   Intravenous Continuous Danis, Roel Clarity III, MD          ___________________________________________________________________ Objective   Exam:  BP (!) 143/57   Pulse 97   Temp (!) 96.8 F (36 C) (Temporal)   Resp 17   Ht 5' (1.524 m)   Wt 86.2 kg   SpO2 100%   BMI 37.11 kg/m   CV: regular , S1/S2 Resp: clear to auscultation bilaterally, normal RR and effort noted GI: soft, no tenderness, with active bowel sounds.   Assessment: Heme positive stool Rectal bleeding Constipation   Plan: Colonoscopy   The benefits and risks of the planned procedure(s) were described in detail with the patient or (when  appropriate) their health care proxy.  Risks were outlined as including, but not limited to, bleeding, infection, perforation, adverse medication reaction leading to cardiac or pulmonary decompensation, pancreatitis (if ERCP).  The limitation of incomplete mucosal visualization was also discussed.  No guarantees or warranties were given.  The patient is appropriate for an endoscopic procedure in the ambulatory setting.   - Lorella Roles, MD

## 2023-12-24 NOTE — Anesthesia Preprocedure Evaluation (Addendum)
 Anesthesia Evaluation  Patient identified by MRN, date of birth, ID band Patient awake    Reviewed: Allergy & Precautions, NPO status , Patient's Chart, lab work & pertinent test results  Airway Mallampati: III  TM Distance: >3 FB Neck ROM: Full    Dental  (+) Upper Dentures   Pulmonary asthma , sleep apnea and Continuous Positive Airway Pressure Ventilation , COPD,  COPD inhaler and oxygen  dependent, Current SmokerPatient did not abstain from smoking.   Pulmonary exam normal        Cardiovascular hypertension, +CHF and + DVT  Normal cardiovascular exam     Neuro/Psych  Headaches, Seizures -,  PSYCHIATRIC DISORDERS Anxiety Depression     Neuromuscular disease    GI/Hepatic negative GI ROS, Neg liver ROS,,,  Endo/Other  diabetes    Renal/GU Renal disease     Musculoskeletal  (+) Arthritis ,    Abdominal  (+) + obese  Peds  Hematology negative hematology ROS (+)   Anesthesia Other Findings heme +stool  Reproductive/Obstetrics                             Anesthesia Physical Anesthesia Plan  ASA: 4  Anesthesia Plan: MAC   Post-op Pain Management:    Induction:   PONV Risk Score and Plan: 1 and Propofol  infusion and Treatment may vary due to age or medical condition  Airway Management Planned: Simple Face Mask  Additional Equipment:   Intra-op Plan:   Post-operative Plan:   Informed Consent: I have reviewed the patients History and Physical, chart, labs and discussed the procedure including the risks, benefits and alternatives for the proposed anesthesia with the patient or authorized representative who has indicated his/her understanding and acceptance.     Dental advisory given  Plan Discussed with: CRNA  Anesthesia Plan Comments:         Anesthesia Quick Evaluation

## 2023-12-24 NOTE — Transfer of Care (Signed)
 Immediate Anesthesia Transfer of Care Note  Patient: [name]  Procedure(s) Performed: COLONOSCOPY  Patient Location: PACU  Anesthesia Type:MAC  Level of Consciousness: sedated, patient cooperative, and responds to stimulation  Airway & Oxygen  Therapy: Patient Spontanous Breathing and Patient connected to face mask oxygen   Post-op Assessment: Report given to RN and Post -op Vital signs reviewed and stable  Post vital signs: Reviewed and stable  Last Vitals:  Vitals Value Taken Time  BP 130/73 12/24/23 0922  Temp 36.7 C 12/24/23 0922  Pulse 90 12/24/23 0925  Resp 13 12/24/23 0925  SpO2 100 % 12/24/23 0925  Vitals shown include unfiled device data.  Last Pain:  Vitals:   12/24/23 0922  TempSrc: Temporal  PainSc: Asleep         Complications: No notable events documented.

## 2023-12-24 NOTE — Anesthesia Postprocedure Evaluation (Signed)
 Anesthesia Post Note  Patient: [name]  Procedure(s) Performed: COLONOSCOPY     Patient location during evaluation: Endoscopy Anesthesia Type: MAC Level of consciousness: awake Pain management: pain level controlled Vital Signs Assessment: post-procedure vital signs reviewed and stable Respiratory status: spontaneous breathing, nonlabored ventilation and respiratory function stable Cardiovascular status: blood pressure returned to baseline and stable Postop Assessment: no apparent nausea or vomiting Anesthetic complications: no   No notable events documented.  Last Vitals:  Vitals:   12/24/23 0930 12/24/23 0938  BP: 138/66 (!) 150/84  Pulse: 90 87  Resp: 14 15  Temp:    SpO2: 100% 99%    Last Pain:  Vitals:   12/24/23 0938  TempSrc:   PainSc: 0-No pain                 Samiyyah Moffa P Deanette Tullius

## 2023-12-24 NOTE — Interval H&P Note (Signed)
 History and Physical Interval Note:  12/24/2023 8:32 AM  [name]  has presented today for surgery, with the diagnosis of heme +stool.  The various methods of treatment have been discussed with the patient and family. After consideration of risks, benefits and other options for treatment, the patient has consented to  Procedure(s): COLONOSCOPY (N/A) as a surgical intervention.  The patient's history has been reviewed, patient examined, no change in status, stable for surgery.  I have reviewed the patient's chart and labs.  Questions were answered to the patient's satisfaction.     Kerby Pearson III

## 2023-12-24 NOTE — Discharge Instructions (Signed)
 YOU HAD AN ENDOSCOPIC PROCEDURE TODAY: Refer to the procedure report and other information in the discharge instructions given to you for any specific questions about what was found during the examination. If this information does not answer your questions, please call [place] office at [phone] to clarify.   YOU SHOULD EXPECT: Some feelings of bloating in the abdomen. Passage of more gas than usual. Walking can help get rid of the air that was put into your GI tract during the procedure and reduce the bloating. If you had a lower endoscopy (such as a colonoscopy or flexible sigmoidoscopy) you may notice spotting of blood in your stool or on the toilet paper. Some abdominal soreness may be present for a day or two, also.  DIET: Your first meal following the procedure should be a light meal and then it is ok to progress to your normal diet. A half-sandwich or bowl of soup is an example of a good first meal. Heavy or fried foods are harder to digest and may make you feel nauseous or bloated. Drink plenty of fluids but you should avoid alcoholic beverages for 24 hours. If you had a esophageal dilation, please see attached instructions for diet.    ACTIVITY: Your care partner should take you home directly after the procedure. You should plan to take it easy, moving slowly for the rest of the day. You can resume normal activity the day after the procedure however YOU SHOULD NOT DRIVE, use power tools, machinery or perform tasks that involve climbing or major physical exertion for 24 hours (because of the sedation medicines used during the test).   SYMPTOMS TO REPORT IMMEDIATELY: A gastroenterologist can be reached at any hour. Please call [phone]  for any of the following symptoms:  Following lower endoscopy (colonoscopy, flexible sigmoidoscopy) Excessive amounts of blood in the stool  Significant tenderness, worsening of abdominal pains  Swelling of the abdomen that is new, acute  Fever of 100 or  higher   FOLLOW UP:  If any biopsies were taken you will be contacted by phone or by letter within the next 1-3 weeks. Call [phone]  if you have not heard about the biopsies in 3 weeks.  Please also call with any specific questions about appointments or follow up tests.

## 2023-12-24 NOTE — Op Note (Signed)
 [facility] Patient Name: [name] Procedure Date: 12/24/2023 MRN: [id number] Attending MD: Roel Clarity. Dominic Friendly , MD, [id number] Date of Birth: 18-Jul-1956 CSN: [id number] Age: 68 Admit Type: Outpatient Procedure:                Colonoscopy Indications:              Positive fecal immunochemical test, Rectal                            bleeding, Constipation Providers:                Ace Abu L. Dominic Friendly, MD, Marden Shaggy, RN, Marvelyn Slim, Technician Referring MD:              Medicines:                Monitored Anesthesia Care Complications:            No immediate complications. Estimated Blood Loss:     Estimated blood loss was minimal. Procedure:                Pre-Anesthesia Assessment:                           - Prior to the procedure, a History and Physical                            was performed, and patient medications and                            allergies were reviewed. The patient's tolerance of                            previous anesthesia was also reviewed. The risks                            and benefits of the procedure and the sedation                            options and risks were discussed with the patient.                            All questions were answered, and informed consent                            was obtained. Prior Anticoagulants: The patient has                            taken no anticoagulant or antiplatelet agents. ASA                            Grade Assessment: III - A patient with severe                            systemic  disease. After reviewing the risks and                            benefits, the patient was deemed in satisfactory                            condition to undergo the procedure.                           After obtaining informed consent, the colonoscope                            was passed under direct vision. Throughout the                            procedure, the patient's blood  pressure, pulse, and                            oxygen  saturations were monitored continuously. The                            CF-HQ190L ([id number]) Olympus colonoscope was                            introduced through the anus and advanced to the the                            cecum, identified by appendiceal orifice and                            ileocecal valve. The PCF-HQ190L ([id number]) Olympus                            colonoscope was introduced through the and advanced                            to the. The colonoscopy was extremely difficult due                            to multiple diverticula in the colon, a redundant                            colon, significant looping and a tortuous colon.                            Successful completion of the procedure was aided by                            using manual pressure, withdrawing the scope and                            replacing with the pediatric colonoscope,  straightening and shortening the scope to obtain                            bowel loop reduction and lavage. The patient                            tolerated the procedure well. The quality of the                            bowel preparation was poor despite extensive                            lavage. The ileocecal valve, appendiceal orifice,                            and rectum were photographed. The bowel preparation                            used was GoLYTELY . Scope In: 8:43:05 AM Scope Out: 9:17:30 AM Scope Withdrawal Time: 0 hours 14 minutes 13 seconds  Total Procedure Duration: 0 hours 34 minutes 25 seconds  Findings:      The perianal and digital rectal examinations were normal.      Repeat examination of right colon under NBI performed.      Three sessile polyps were found in the sigmoid colon, ascending colon       and cecum. The polyps were 2 to 6 mm in size. These polyps were removed       with a cold snare. Resection and retrieval  were complete.      Multiple diverticula were found in the left colon. Severe associated       tortuosity and angulation.      Internal hemorrhoids were found.      The exam was otherwise without abnormality on direct and retroflexion       views. Impression:               - Preparation of the colon was poor.                           - Three 2 to 6 mm polyps in the sigmoid colon, in                            the ascending colon and in the cecum, removed with                            a cold snare. Resected and retrieved.                           - Diverticulosis in the left colon.                           - Internal hemorrhoids.                           - The examination was otherwise normal on direct  and retroflexion views. Moderate Sedation:      MAC sedation used Recommendation:           - Patient has a contact number available for                            emergencies. The signs and symptoms of potential                            delayed complications were discussed with the                            patient. Return to normal activities tomorrow.                            Written discharge instructions were provided to the                            patient.                           - Resume previous diet.                           - Continue present medications.                           - Await pathology results.                           - Repeat colonoscopy in 1 year because the bowel                            preparation was suboptimal and for surveillance.                            Next colonoscopy, patient will need to do a Suprep                            2 days prior to the procedure followed by a split                            dose GoLytely  prep in order to achieve adequate                            preparation.                           - MiraLAX  1 capful twice daily for long-term                            management of  constipation. This patient has severe                            diverticulosis and anatomical changes of the left  colon are significantly contributing to her                            constipation. Procedure Code(s):        --- Professional ---                           [phone], Colonoscopy, flexible; with removal of                            tumor(s), polyp(s), or other lesion(s) by snare                            technique Diagnosis Code(s):        --- Professional ---                           K64.8, Other hemorrhoids                           D12.5, Benign neoplasm of sigmoid colon                           D12.2, Benign neoplasm of ascending colon                           D12.0, Benign neoplasm of cecum                           R19.5, Other fecal abnormalities                           K62.5, Hemorrhage of anus and rectum                           K59.00, Constipation, unspecified                           K57.30, Diverticulosis of large intestine without                            perforation or abscess without bleeding CPT copyright 2022 [employer]. All rights reserved. The codes documented in this report are preliminary and upon coder review may  be revised to meet current compliance requirements. Aseneth Hack L. Dominic Friendly, MD 12/24/2023 9:29:24 AM This report has been signed electronically. Number of Addenda: 0

## 2023-12-25 ENCOUNTER — Encounter (HOSPITAL_COMMUNITY): Payer: Self-pay | Admitting: Gastroenterology

## 2023-12-25 LAB — T4, FREE: Free T4: 0.98 ng/dL (ref 0.82–1.77)

## 2023-12-25 LAB — TSH: TSH: 1.78 u[IU]/mL (ref 0.450–4.500)

## 2023-12-25 LAB — T3, FREE: T3, Free: 2.7 pg/mL (ref 2.0–4.4)

## 2023-12-25 LAB — SURGICAL PATHOLOGY

## 2023-12-27 ENCOUNTER — Ambulatory Visit: Payer: Self-pay | Admitting: Gastroenterology

## 2023-12-28 ENCOUNTER — Ambulatory Visit (INDEPENDENT_AMBULATORY_CARE_PROVIDER_SITE_OTHER): Admitting: Nurse Practitioner

## 2023-12-28 ENCOUNTER — Encounter: Payer: Self-pay | Admitting: Nurse Practitioner

## 2023-12-28 VITALS — BP 104/74 | HR 82 | Ht 60.0 in | Wt 190.0 lb

## 2023-12-28 DIAGNOSIS — R946 Abnormal results of thyroid function studies: Secondary | ICD-10-CM

## 2023-12-28 NOTE — Progress Notes (Signed)
 12/28/2023     Endocrinology Follow Up Note    Subjective:    Patient ID: [name], female    DOB: Dec 11, 1955, PCP Leitha Purl, MD.   Past Medical History:  Diagnosis Date   Allergic rhinitis    Anxiety    Arthritis    "hands and legs and feet" (04/27/2012)   Asthma    Cancer (HCC) 2005   rt arm mole rem cancer   CHF (congestive heart failure) (HCC)    Chronic bronchitis (HCC)    "keep it all the time" (04/27/2012)   Chronic kidney disease    Chronic kidney disease (CKD)    Stage III   COPD (chronic obstructive pulmonary disease) (HCC)    COVID-19 virus infection    03-2021   Depression    PT DENIES   Diverticulosis    DJD (degenerative joint disease)    Emphysema    Epilepsy (HCC)    Epilepsy (HCC)    Esophageal stricture    Exertional dyspnea    GERD (gastroesophageal reflux disease)    Glaucoma    H/O blood clots    Right leg   Heart failure (HCC)    History of COVID-19 03/2021   History of kidney stones    History of nuclear stress test    Myoview  12/25/22: EF 74, no ischemia or infarction, low risk   Hyperlipidemia    Hypertension    IBS (irritable bowel syndrome)    Migraine    Neck pain    Cervical disc   OSA on CPAP    6-7 yrs; pt wears CPAP but has not been able to use it lately due to sinus issues   Oxygen  deficiency    USES 3 LITERS 02 CONTINOUS   Pneumonia 2012; 04/27/2012   Recurrent upper respiratory infection (URI)    Right leg DVT (HCC) 1982   groin   Seizures (HCC) 06/2023   Sleep apnea    Type II diabetes mellitus (HCC)    No meds since weight loss   UTI (urinary tract infection)    2 weeks ago   Vertigo 10/24/2014    Past Surgical History:  Procedure Laterality Date   ABDOMINAL HYSTERECTOMY  2001   "partial"   BIOPSY  04/16/2021   Procedure: BIOPSY;  Surgeon: Sergio Dandy, MD;  Location: [facility];  Service: Endoscopy;;   CARPAL TUNNEL RELEASE  ~ 2008   right   CARPAL TUNNEL RELEASE Right     CATARACT EXTRACTION Bilateral    COLONOSCOPY     COLONOSCOPY N/A 12/24/2023   Procedure: COLONOSCOPY;  Surgeon: Albertina Hugger, MD;  Location: [facility];  Service: Gastroenterology;  Laterality: N/A;   COLONOSCOPY WITH ESOPHAGOGASTRODUODENOSCOPY (EGD)     CYSTOSCOPY/URETEROSCOPY/HOLMIUM LASER/STENT PLACEMENT Left 01/27/2023   Procedure: CYSTOSCOPY LEFT RETROGRADE PYELOGRAM, LEFT URETEROSCOPY, HOLMIUM LASER LITHOTRIPSY, AND LEFT URETERAL STENT PLACEMENT;  Surgeon: Homero Luster, MD;  Location: [facility];  Service: Urology;  Laterality: Left;  60 MINUTES   ESOPHAGOGASTRODUODENOSCOPY (EGD) WITH PROPOFOL  N/A 04/16/2021   Procedure: ESOPHAGOGASTRODUODENOSCOPY (EGD) WITH PROPOFOL ;  Surgeon: Sergio Dandy, MD;  Location: [facility];  Service: Endoscopy;  Laterality: N/A;   KNEE ARTHROSCOPY  1990's   left   NASAL SEPTOPLASTY W/ TURBINOPLASTY Bilateral 05/23/2016   Procedure: NASAL SEPTOPLASTY WITH TURBINATE REDUCTION;  Surgeon: Ammon Bales, MD;  Location[facility] OR;  Service: ENT;  Laterality: Bilateral;   SHOULDER OPEN ROTATOR CUFF REPAIR  ~ 2010  left   SINOSCOPY     SINUS ENDO WITH FUSION Right 05/23/2016   Procedure: LIMITED RIGHT ENDOSCOPIC SINUS SURGERY;  Surgeon: Ammon Bales, MD;  Location: [facility] OR;  Service: ENT;  Laterality: Right;   TOTAL KNEE ARTHROPLASTY  08/23/2012   right   TOTAL KNEE ARTHROPLASTY  08/23/2012   Procedure: TOTAL KNEE ARTHROPLASTY;  Surgeon: Genevie Kerns, MD;  Location: [facility] OR;  Service: Orthopedics;  Laterality: Right;  RIGHT KNEE ARTHROPLASTY MEDIAL AND LATERAL COMPARTMENTS WITH PATELLA RESURFACING   TUBAL LIGATION  2001   UPPER GASTROINTESTINAL ENDOSCOPY      Social History   Socioeconomic History   Marital status: Married    Spouse name: Erven Heater   Number of children: 2   Years of education: 12   Highest education level: Not on file  Occupational History   Occupation: disabled  Tobacco Use   Smoking status: Every Day    Current packs/day: 0.25     Average packs/day: 0.5 packs/day for 46.8 years (23.0 ttl pk-yrs)    Types: Cigarettes    Start date: 08/12/1975    Last attempt to quit: 12/30/2020   Smokeless tobacco: Never   Tobacco comments:    Smoking "a few cigarettes per day".  Trying to quit.  01/13/2023  Vaping Use   Vaping status: Never Used  Substance and Sexual Activity   Alcohol use: No    Alcohol/week: 0.0 standard drinks of alcohol   Drug use: No   Sexual activity: Never  Other Topics Concern   Not on file  Social History Narrative   Patient lives at home with husband Erven Heater, her son and his wife.    Patient has 2 children.    Patient has a high school education.    Patient is currently unemployed.    Patient is right handed.    Patient drinks 2 cups of caffeine per day.   Social Drivers of Corporate investment banker Strain: Low Risk  (05/06/2023)   Received [facility]   Overall Financial Resource Strain (CARDIA)    Difficulty of Paying Living Expenses: Not hard at all  Food Insecurity: No Food Insecurity (09/16/2023)   Received from [facility]   Hunger Vital Sign    Worried About Running Out of Food in the Last Year: Never true    Ran Out of Food in the Last Year: Never true  Transportation Needs: No Transportation Needs (09/16/2023)   Received from [facility] - Transportation    Lack of Transportation (Medical): No    Lack of Transportation (Non-Medical): No  Physical Activity: Inactive (05/06/2023)   Received from [facility]   Exercise Vital Sign    Days of Exercise per Week: 0 days    Minutes of Exercise per Session: 0 min  Stress: No Stress Concern Present (09/16/2023)   Received from [facility] - Occupational Stress Questionnaire    Feeling of Stress : Not at all  Social Connections: Socially Integrated (05/06/2023)   Received from [facility]   Social Connection and Isolation Panel [NHANES]    Frequency of  Communication with Friends and Family: More than three times a week    Frequency of Social Gatherings with Friends and Family: Three times a week    Attends Religious Services: 1 to 4 times per year    Active Member of Clubs or Organizations: No    Attends [employer]  or Organization Meetings: 1 to 4 times per year    Marital Status: Married    Family History  Problem Relation Age of Onset   Diabetes Mother    Lung cancer Mother        was a smoker   Stroke Mother    Alcohol abuse Father    Lung cancer Father        was a smoker   Emphysema Father        was a smoker   Heart disease Sister    Other Sister        blood clotting disorder   Diabetes Brother    Seizures Brother    Diabetes Maternal Aunt    Breast cancer Maternal Aunt    Esophageal cancer Maternal Uncle    Diabetes Maternal Uncle    Heart disease Maternal Grandfather    Diabetes Maternal Grandfather    Bipolar disorder Son    Muscular dystrophy Son    Heart disease Son    Heart disease Other        uncle   Colon cancer Neg Hx    Pancreatic cancer Neg Hx    Stomach cancer Neg Hx    Colon polyps Neg Hx    Rectal cancer Neg Hx     Outpatient Encounter Medications as of 12/28/2023  Medication Sig   albuterol  (PROAIR  HFA) 108 (90 Base) MCG/ACT inhaler Inhale 2 puffs into the lungs every 6 (six) hours as needed for shortness of breath or wheezing.   albuterol  (PROVENTIL ) (2.5 MG/3ML) 0.083% nebulizer solution USE 1 VIAL IN NEBULIZER EVERY 6 HOURS AS NEEDED FOR SHORTNESS OF BREATH (Patient taking differently: Take 2.5 mg by nebulization every 6 (six) hours as needed for wheezing or shortness of breath.)   allopurinol (ZYLOPRIM) 100 MG tablet Take 100 mg by mouth daily.   ALPRAZolam  (XANAX ) 0.25 MG tablet Take 0.25 mg by mouth 2 (two) times daily.   Azelastine  HCl (ASTEPRO ) 0.15 % SOLN Place 1 spray into the nose 2 (two) times daily as needed (Allergies).   Cholecalciferol  (VITAMIN D3) 50 MCG (2000 UT) capsule TAKE ONE  CAPSULE BY MOUTH DAILY (Patient taking differently: Take 2,000 Units by mouth daily.)   famotidine  (PEPCID ) 40 MG tablet Take 40 mg by mouth at bedtime.   Fluticasone -Umeclidin-Vilant (TRELEGY ELLIPTA ) 200-62.5-25 MCG/ACT AEPB Inhale 1 puff into the lungs daily.   gabapentin  (NEURONTIN ) 100 MG capsule Take 1 capsule (100 mg total) by mouth at bedtime. (Patient taking differently: Take 200 mg by mouth 3 (three) times daily.)   guaiFENesin  (MUCINEX ) 600 MG 12 hr tablet Take 2 tablets (1,200 mg total) by mouth 2 (two) times daily as needed. (Patient taking differently: Take 1,200 mg by mouth 2 (two) times daily as needed for cough or to loosen phlegm.)   ipratropium-albuterol  (DUONEB) 0.5-2.5 (3) MG/3ML SOLN Take 3 mLs by nebulization every 6 (six) hours as needed (Wheezing/sob).   loratadine  (ALLERGY RELIEF) 10 MG tablet Take 1 tablet (10 mg total) by mouth daily.   metoCLOPramide  (REGLAN ) 5 MG tablet Take 1 tablet (5 mg total) by mouth every 12 (twelve) hours as needed for up to 2 doses for nausea. Take 30-45 minutes before evening and AM doses of bowel preparation solution.   montelukast  (SINGULAIR ) 10 MG tablet TAKE ONE TABLET BY MOUTH AT BEDTIME   nicotine  (NICODERM CQ  - DOSED IN MG/24 HOURS) 14 mg/24hr patch Place 1 patch (14 mg total) onto the skin daily.   ondansetron  (ZOFRAN ) 4  MG tablet Take 1 tablet (4 mg total) by mouth daily as needed for nausea or vomiting.   oxyCODONE -acetaminophen  (PERCOCET/ROXICET) 5-325 MG tablet Take 1 tablet by mouth every 6 (six) hours as needed for severe pain.   OXYGEN  Inhale 3 L into the lungs continuous.   pantoprazole  (PROTONIX ) 40 MG tablet Take 1 tablet (40 mg total) by mouth 2 (two) times daily. **PLEASE CALL OFFICE TO SCHEDULE FOLLOW UP   QUEtiapine  (SEROQUEL ) 100 MG tablet TAKE ONE TABLET BY MOUTH AT BEDTIME   simvastatin  (ZOCOR ) 40 MG tablet TAKE ONE TABLET BY MOUTH AT BEDTIME (Patient taking differently: Take 40 mg by mouth daily at 6 PM.)   topiramate   (TOPAMAX ) 100 MG tablet TAKE TWO TABLETS BY MOUTH TWICE DAILY   traMADol  (ULTRAM ) 50 MG tablet Take 1 tablet (50 mg total) by mouth every 6 (six) hours as needed.   triamcinolone  cream (KENALOG ) 0.1 % Apply 1 Application topically daily as needed (irritation).   ciprofloxacin  (CIPRO ) 500 MG tablet Take 1 tablet (500 mg total) by mouth 2 (two) times daily. (Patient not taking: Reported on 12/28/2023)   predniSONE  (DELTASONE ) 10 MG tablet Take 4 tabs for 2 days, then 3 tabs for 2 days, 2 tabs for 2 days, then 1 tab for 2 days, then stop. (Patient not taking: Reported on 12/28/2023)   predniSONE  (DELTASONE ) 10 MG tablet Take  4 each am x 2 days,   2 each am x 2 days,  1 each am x 2 days and stop (Patient not taking: Reported on 12/28/2023)   No facility-administered encounter medications on file as of 12/28/2023.    ALLERGIES: Allergies  Allergen Reactions   Amoxicillin Anaphylaxis    Breakout, mouth swells   Azithromycin Swelling and Rash    "swelling all over, hives, thrush"   Cefdinir      Throat swelling after 3rd dose   Chantix  [Varenicline ] Other (See Comments)    "bad dreams, difficulty breathing"   Clindamycin  Anaphylaxis   Doxycycline  Hyclate Anaphylaxis and Rash    swelling   Levofloxacin  Anaphylaxis    Pt can take moxifloxacin    Paxlovid  [Nirmatrelvir -Ritonavir ] Swelling    Throat swelling   Penicillins Anaphylaxis, Shortness Of Breath and Rash    "ALMOST DIED, ended up in hospital"    Sulfonamide Derivatives Shortness Of Breath, Swelling and Rash    "break out from head to toe"   Toradol  [Ketorolac  Tromethamine ] Other (See Comments)    Seizure    Clarithromycin Hives    swelling   Nortriptyline Swelling, Rash and Other (See Comments)    Per patient had "kidney problems" on this medication, could not use bathroom (urinary retention)   Codeine  Nausea And Vomiting   Fluticasone -Salmeterol Other (See Comments)    REACTION: ulcers in mouth   Triptans Rash    Mouth breaks out  and start itching    VACCINATION STATUS: Immunization History  Administered Date(s) Administered   Influenza Split 05/22/2011, 05/05/2012   Influenza Whole 05/27/2007, 05/01/2008, 05/22/2009, 05/23/2010   Influenza, High Dose Seasonal PF 05/06/2023   Influenza,inj,Quad PF,6+ Mos 05/10/2013, 04/19/2014, 06/25/2015, 04/15/2016, 05/12/2017, 05/11/2019, 05/16/2020, 05/15/2021   PNEUMOCOCCAL CONJUGATE-20 07/02/2021   Pneumococcal Polysaccharide-23 08/12/1999, 04/19/2014, 06/11/2021   Td 12/09/2001   Tdap 01/05/2013     HPI  Deyna Carbon Jenning is 68 y.o. female who presents today with a medical history as above, accompanied by her grandson. she is being seen in follow up after being seen in consultation for abnormal thyroid  function (concerns for central  hypothyroidism) requested by Leitha Purl, MD.  she has been dealing with symptoms of fatigue, forgetfulness, generalized joint aches/pains, constipation, weight gain, cold intolerance, and headaches intermittently since end of 2021 beginning of 2022. These symptoms are progressively worsening and troubling to her.  her most recent thyroid  labs revealed normal TSH of 1.297 and low FT4 of 0.64 on 05/06/23.  she does have problems swallowing, had barium swallow showing some penetration but no aspiration.   She does have trouble breathing related to her COPD.   she does not have known family history of thyroid  dysfunction and denies family hx of thyroid  cancer. she denies personal history of goiter. she is not on any anti-thyroid  medications nor on any thyroid  hormone supplements. Denies use of Biotin containing supplements.  she is willing to proceed with appropriate work up.  She intermittently uses steroids to help when she has COPD exacerbation which could contribute to some abnormal thyroid  labs.  She also notes she has had pneumonia several times in the last year, was hospitalized for this as well as sepsis secondary to UTI.    She has never had  any radiation to her head or neck in the past.  She has had CT and MRI of her head in the past and no obvious problems with her pituitary gland were identified at that time.   Review of systems  Constitutional: + stable body weight-reports increasing body weight, current Body mass index is 37.11 kg/m., + fatigue, no subjective hyperthermia, + subjective hypothermia, + intermittent headaches Eyes: no blurry vision, no xerophthalmia ENT: no sore throat, no nodules palpated in throat, + intermittent dysphagia/odynophagia, + hoarseness Cardiovascular: no chest pain, + shortness of breath (Hx COPD), no palpitations, no leg swelling Respiratory: no cough, + shortness of breath (Hx COPD) Gastrointestinal: no nausea/vomiting/diarrhea, + chronic constipation Musculoskeletal: + diffuse chronic muscle/joint aches Skin: no rashes, no hyperemia Neurological: no tremors, no numbness, no tingling, no dizziness, + hx seizures  Psychiatric: no depression, no anxiety   Objective:    BP 104/74 (BP Location: Left Arm, Patient Position: Sitting, Cuff Size: Large)   Pulse 82   Ht 5' (1.524 m)   Wt 190 lb (86.2 kg) Comment: Patient reported this weight -  BMI 37.11 kg/m   Wt Readings from Last 3 Encounters:  12/28/23 190 lb (86.2 kg)  12/24/23 190 lb (86.2 kg)  10/22/23 191 lb (86.6 kg)     BP Readings from Last 3 Encounters:  12/28/23 104/74  12/24/23 (!) 150/84  10/22/23 114/70                        Physical Exam- Limited  Constitutional:  Body mass index is 37.11 kg/m. , not in acute distress, normal state of mind Eyes:  EOMI, no exophthalmos Musculoskeletal: no gross deformities, strength intact in all four extremities, no gross restriction of joint movements Skin:  no rashes, no hyperemia Neurological: no tremor with outstretched hands   CMP     Component Value Date/Time   NA 136 02/06/2023 0809   NA 142 05/24/2021 0950   K 4.0 02/06/2023 0809   CL 107 02/06/2023 0809   CO2 22  02/06/2023 0809   GLUCOSE 95 02/06/2023 0809   BUN 21 02/06/2023 0809   BUN 13 05/24/2021 0950   CREATININE 1.11 (H) 02/06/2023 0809   CREATININE 0.91 06/13/2016 1441   CALCIUM  8.6 (L) 02/06/2023 0809   PROT 7.0 02/05/2023 0518   PROT 7.2 08/21/2021  1120   ALBUMIN 2.7 (L) 02/05/2023 0518   ALBUMIN 4.1 08/21/2021 1120   AST 12 (L) 02/05/2023 0518   ALT 10 02/05/2023 0518   ALKPHOS 85 02/05/2023 0518   BILITOT 0.5 02/05/2023 0518   BILITOT <0.2 08/21/2021 1120   GFR 59.31 (L) 11/28/2022 1508   EGFR 48 (L) 05/24/2021 0950   GFRNONAA 55 (L) 02/06/2023 0809   GFRNONAA 70 06/13/2016 1441     CBC    Component Value Date/Time   WBC 9.7 02/06/2023 0809   RBC 3.07 (L) 02/06/2023 0809   HGB 9.4 (L) 02/06/2023 0809   HGB 10.1 (L) 05/24/2021 0950   HCT 30.7 (L) 02/06/2023 0809   HCT 30.9 (L) 05/24/2021 0950   PLT 250 02/06/2023 0809   PLT 297 05/24/2021 0950   MCV 100.0 02/06/2023 0809   MCV 93 05/24/2021 0950   MCH 30.6 02/06/2023 0809   MCHC 30.6 02/06/2023 0809   RDW 15.3 02/06/2023 0809   RDW 14.4 05/24/2021 0950   LYMPHSABS 1.8 02/04/2023 1950   LYMPHSABS 1.7 05/24/2021 0950   MONOABS 1.5 (H) 02/04/2023 1950   EOSABS 0.0 02/04/2023 1950   EOSABS 0.2 05/24/2021 0950   BASOSABS 0.1 02/04/2023 1950   BASOSABS 0.0 05/24/2021 0950     Diabetic Labs (most recent): Lab Results  Component Value Date   HGBA1C 5.7 (H) 12/24/2022   HGBA1C 5.5 05/16/2020   HGBA1C 5.6 11/30/2019    Lipid Panel     Component Value Date/Time   CHOL 184 08/21/2021 1120   TRIG 106 08/21/2021 1120   HDL 91 08/21/2021 1120   CHOLHDL 2.0 08/21/2021 1120   CHOLHDL 2.8 06/19/2014 1656   VLDL 35 06/19/2014 1656   LDLCALC 75 08/21/2021 1120   LDLDIRECT 77 09/14/2012 1618   LABVLDL 18 08/21/2021 1120   Thyroid  ultrasound from 08/10/23  CLINICAL DATA:  Other.  Dysphagia, abnormal thyroid  function studies   EXAM: THYROID  ULTRASOUND   TECHNIQUE: Ultrasound examination of the thyroid  gland  and adjacent soft tissues was performed.   COMPARISON:  None Available.   FINDINGS: Parenchymal Echotexture: Mildly heterogenous   Isthmus: 0.3 cm   Right lobe: 4.0 x 1.9 x 1.9 cm   Left lobe: 3.7 x 2.1 x 1.4 cm   _________________________________________________________   Estimated total number of nodules >/= 1 cm: 0   Number of spongiform nodules >/=  2 cm not described below (TR1): 0   Number of mixed cystic and solid nodules >/= 1.5 cm not described below (TR2): 0   _________________________________________________________   No discrete nodules of consequence are seen within the thyroid  gland. Solitary subcentimeter nodule noted incidentally in the right upper gland. This lesion does not meet criteria to recommend further imaging surveillance or other evaluation.   Normal vascularity on color Doppler imaging.   IMPRESSION: Mildly heterogeneous but otherwise unremarkable thyroid  gland.   The above is in keeping with the ACR TI-RADS recommendations - J Am Coll Radiol 2017;14:587-595.     Electronically Signed   By: Fernando Hoyer M.D.   On: 08/15/2023 07:25     Latest Reference Range & Units 01/30/12 17:00 08/07/13 09:50 06/19/14 16:56 05/06/23 00:00 08/10/23 11:33 12/14/23 10:02  TSH 0.450 - 4.500 uIU/mL 2.473 0.719 2.314 1.30 (E) 1.480 1.780  Triiodothyronine,Free,Serum 2.0 - 4.4 pg/mL     2.6 2.7  T4,Free(Direct) 0.82 - 1.77 ng/dL     1.61 0.96  Thyroperoxidase Ab SerPl-aCnc 0 - 34 IU/mL     9  Thyroglobulin Antibody 0.0 - 0.9 IU/mL     <1.0   (E): External lab result    Assessment & Plan:   1. Abnormal thyroid  function test-resolved  she is being seen at a kind request of Leitha Purl, MD.  Her thyroid  antibody testing was negative, ruling out autoimmune thyroid  dysfunction.  Her labs show euthyroid presentation.  This is suggestive of acute thyroiditis as an explanation of her recent abnormalities vs chronic intermittent steroid use for COPD  exacerbations which can throw off thyroid  labs as well.    Her thyroid  ultrasound shows mild heterogenicity with no nodules and of average size.  This rules out the thyroid  as potential cause of her hoarseness and trouble swallowing.  She does not require any additional imaging.  At this point, she can go back to annual surveillance labs with her PCP and see me again if needed.    -Patient is advised to maintain close follow up with Leitha Purl, MD for primary care needs.    I spent  14  minutes in the care of the patient today including review of labs from Thyroid  Function, CMP, and other relevant labs ; imaging/biopsy records (current and previous including abstractions from other facilities); face-to-face time discussing  her lab results and symptoms, medications doses, her options of short and long term treatment based on the latest standards of care / guidelines;   and documenting the encounter.  Samantha Cress Staebell  participated in the discussions, expressed understanding, and voiced agreement with the above plans.  All questions were answered to her satisfaction. she is encouraged to contact clinic should she have any questions or concerns prior to her return visit.  Follow up plan: Return if symptoms worsen or fail to improve.   Thank you for involving me in the care of this pleasant patient, and I will continue to update you with her progress.    Hulon Magic, [facility] [address] [place], [place] [address] Phone: [phone] Fax: [phone]  12/28/2023, 9:05 AM

## 2023-12-28 NOTE — Telephone Encounter (Signed)
 Letter mailed

## 2024-01-06 ENCOUNTER — Other Ambulatory Visit: Payer: Self-pay | Admitting: Primary Care

## 2024-01-12 ENCOUNTER — Encounter: Payer: Self-pay | Admitting: Neurology

## 2024-01-12 ENCOUNTER — Ambulatory Visit (INDEPENDENT_AMBULATORY_CARE_PROVIDER_SITE_OTHER): Admitting: Neurology

## 2024-01-12 VITALS — BP 109/75 | HR 74 | Resp 17

## 2024-01-12 DIAGNOSIS — G40909 Epilepsy, unspecified, not intractable, without status epilepticus: Secondary | ICD-10-CM

## 2024-01-12 MED ORDER — QUETIAPINE FUMARATE 100 MG PO TABS: 100.0000 mg | ORAL_TABLET | Freq: Every day | ORAL | 11 refills | Status: DC

## 2024-01-12 MED ORDER — LACOSAMIDE 50 MG PO TABS: 50.0000 mg | ORAL_TABLET | Freq: Two times a day (BID) | ORAL | 5 refills | Status: DC

## 2024-01-12 MED ORDER — TOPIRAMATE 100 MG PO TABS: 200.0000 mg | ORAL_TABLET | Freq: Two times a day (BID) | ORAL | 2 refills | Status: DC

## 2024-01-12 NOTE — Patient Instructions (Addendum)
 Continue with Topiramate  200 mg nightly  Start Lacosamide 50 mg daily for 2 weeks then increase to 50 mg twice daily  Continue your other medications  Continue to follow up with neurosurgery  Return in 6 months or sooner if worse

## 2024-01-12 NOTE — Progress Notes (Signed)
 PATIENT: [name] DOB: [birth year]  REASON FOR VISIT: follow up HISTORY FROM: patient Primary Neurologist: Dr. Samara Crest since Dr Tilda Fogo retired   HISTORY OF PRESENT ILLNESS: Today 01/12/24. Patient presents today for follow-up, she is accompanied by her grandson.  Last visit was in October, she tells me since then she continues to have small seizures, her typical staring spells, no convulsions.  Most of the time the seizures are triggered by the heat,  when she is in a crowd and sometime by anxiety.  She tells me her last episode was on Mother's Day but in the past month, she is having more events.  Currently she is only taking topiramate  200 mg at night, she could not tolerate the lamotrigine  and did have side effect from the levetiracetam . In terms of her MRI cervical spine finding, we did the EMG nerve conduction study which did not show any signs of radiculopathy or myelopathy.  She did follow-up with neurosurgery and currently is just conservative management with muscle relaxant.   INTERVAL HISTORY 06/01/2023 Patient presents today for follow-up, last visit was in August 2023.  Since then, she reports completing physical therapy but still having neck pain worse with head movement, radiating to the back.  In term of the EEG, she reports side effect of lamotrigine  in the morning therefore only been taking the medication in the evening.  She does complain of recurrent seizures that she described as staring spell.  She is compliant with the topiramate .  Patient is still on supplemental oxygen .   INTERVAL HISTORY 03/20/2022:  Patient presents today for follow up. Reports no seizures since last visit. She is compliant with her Topiramate  and Lamotrigine  but now has a new complaint of neck pain. Her EEG was repeated and showed only mild diffuse slowing, no epileptiform discharges. She is not on supplemental oxygen  any longer.   INTERVAL HISTORY 10/30/2021: Patient presents today for follow-up,  she is accompanied by her daughter-in-law.  She was recently admitted at [facility] for heart failure, UTI and COPD.  Daughter reported that this is her fourth hospitalization since May 2022 for COPD and UTI.  Currently she is on supplemental O2 oxygen .  Daughter in law still report episode of staring, and they are now frequent, she has them daily up to 4 times per day.  During this time she is unresponsive but no other abnormal movement noted.  At last visit, we have plan to obtain an ambulatory EEG but this was not covered by insurance, she did have some issue with transportation therefore could not go to the EMU.  She is currently on lamotrigine  200 mg twice daily and Topamax  200 mg twice daily.  Denies any side effect from the medication.   Interval history 07/24/2021:  Arva Slaugh here today for follow-up, hasn't been seen since Aug 2021, at that time referred to EMU with Dr. Merceda Stairs for intractable epilepsy. Remains on Lamictal  300 mg BID, Topamax  100/200 mg daily  Has seizures where she will blank out (2 this month), stare off, witnessed by her grandson, lasted no more than 1 minute. Was normal beforehand. Afterwards was tired, groggy, wanted to sleep. In the morning, 2 hours after taking morning pills. Will have spell on average, every 2 months. Can't identify any triggers.  She lives in [place], didn't have transportation, she cancelled the EMU admission.   Headaches are improved. Not daily anymore. Doesn't eat more than 1 meal a day. Takes Tylenol , gets 1 migraine a month that is  is significant.   Recently hospitalized for UTI was septic. Still on Seroquel  100 mg at bedtime to help sleep. Is wearing 2 L oxygen  daily, forgot it today. Still smoking, 5 cigarette daily. Here today with her grandson, Lavena Posner.  Update 04/10/2020 SS:Ms. Carrol is a 68 year old female with history of intractable epilepsy and intractable headaches. She is on Lamictal  and Topamax , when last seen Depakote  was added. She complained of  swallowing problems, barium swallow recommended caution with bread and meats, thin liquids, had mild pharyngeal dysphagia.  Could not tolerate Depakote , reported stomach upset, took for 1 week.  Reports 1 seizure of loss of consciousness while lying on the bed, triggered by coughing episode, she says not always coughing related, usually once a month.  Does have episodes of staring, lasting a few seconds, several times a month.  Headaches are fairly well controlled, on average 3-4 migraines a month, tolerating Aimovig  70 mg every 2 weeks well, has been quite beneficial.  Has noted that her voice is raspy, continues with trouble swallowing, does have COPD, still smokes.  Recently saw PCP, did not mention this.  She does not drive, lives with family.  HISTORY 10/12/2019 Dr. Tilda Fogo: Leasa Kincannon is a 68 year old right-handed white female with a history of intractable epilepsy and intractable headache.  The patient has been on lamotrigine  and Topamax  without much benefit with her seizures.  She continues to have frequent headaches, her headaches have been daily in nature.  She has not responded to Ajovy .  In the past, she has been on Botox injections and seemed to improve some with the headaches.  More recently in the last month she is having increasing problems with swallowing with both liquids and solids.  She indicates that food will get caught in the back of the throat and she has to cough it back up again.  She is not getting food down in the lungs.  She has noted some hoarseness of the voice.  She is not losing weight, in fact she is gaining weight.  The patient does not operate a motor vehicle.  She was set up for video visit today but cannot access this, a telephone visit therefore was done.  REVIEW OF SYSTEMS: Out of a complete 14 system review of symptoms, the patient complains only of the following symptoms, and all other reviewed systems are negative.  Seizure, headache  ALLERGIES: Allergies   Allergen Reactions   Amoxicillin Anaphylaxis    Breakout, mouth swells   Azithromycin Swelling and Rash    "swelling all over, hives, thrush"   Cefdinir      Throat swelling after 3rd dose   Chantix  [Varenicline ] Other (See Comments)    "bad dreams, difficulty breathing"   Clindamycin  Anaphylaxis   Doxycycline  Hyclate Anaphylaxis and Rash    swelling   Levofloxacin  Anaphylaxis   Paxlovid  [Nirmatrelvir -Ritonavir ] Swelling    Throat swelling   Penicillins Anaphylaxis, Shortness Of Breath and Rash    "ALMOST DIED, ended up in hospital"    Sulfonamide Derivatives Shortness Of Breath, Swelling and Rash    "break out from head to toe"   Toradol  [Ketorolac  Tromethamine ] Other (See Comments)    Seizure    Clarithromycin Hives    swelling   Nortriptyline Swelling, Rash and Other (See Comments)    Per patient had "kidney problems" on this medication, could not use bathroom (urinary retention)   Moxifloxacin     Codeine  Nausea And Vomiting   Fluticasone -Salmeterol Other (See Comments)    REACTION: ulcers in  mouth   Triptans Rash    Mouth breaks out and start itching    HOME MEDICATIONS: Outpatient Medications Prior to Visit  Medication Sig Dispense Refill   albuterol  (PROAIR  HFA) 108 (90 Base) MCG/ACT inhaler Inhale 2 puffs into the lungs every 6 (six) hours as needed for shortness of breath or wheezing. 18 g 6   albuterol  (PROVENTIL ) (2.5 MG/3ML) 0.083% nebulizer solution USE 1 VIAL IN NEBULIZER EVERY 6 HOURS AS NEEDED FOR SHORTNESS OF BREATH (Patient taking differently: Take 2.5 mg by nebulization every 6 (six) hours as needed for wheezing or shortness of breath.) 75 mL 8   ALPRAZolam  (XANAX ) 0.25 MG tablet Take 0.25 mg by mouth 2 (two) times daily.     Azelastine  HCl (ASTEPRO ) 0.15 % SOLN Place 1 spray into the nose 2 (two) times daily as needed (Allergies). 30 mL 5   Cholecalciferol  (VITAMIN D3) 50 MCG (2000 UT) capsule TAKE ONE CAPSULE BY MOUTH DAILY (Patient taking differently:  Take 2,000 Units by mouth daily.) 90 capsule 1   ciprofloxacin  (CIPRO ) 500 MG tablet Take 1 tablet (500 mg total) by mouth 2 (two) times daily. 20 tablet 0   famotidine  (PEPCID ) 40 MG tablet Take 40 mg by mouth at bedtime.     gabapentin  (NEURONTIN ) 100 MG capsule Take 1 capsule (100 mg total) by mouth at bedtime. (Patient taking differently: Take 200 mg by mouth 3 (three) times daily.) 90 capsule 3   guaiFENesin  (MUCINEX ) 600 MG 12 hr tablet Take 2 tablets (1,200 mg total) by mouth 2 (two) times daily as needed. (Patient taking differently: Take 1,200 mg by mouth 2 (two) times daily as needed for cough or to loosen phlegm.) 60 tablet 5   ipratropium-albuterol  (DUONEB) 0.5-2.5 (3) MG/3ML SOLN Take 3 mLs by nebulization every 6 (six) hours as needed (Wheezing/sob).     loratadine  (ALLERGY RELIEF) 10 MG tablet Take 1 tablet (10 mg total) by mouth daily. 90 tablet 3   metoCLOPramide  (REGLAN ) 5 MG tablet Take 1 tablet (5 mg total) by mouth every 12 (twelve) hours as needed for up to 2 doses for nausea. Take 30-45 minutes before evening and AM doses of bowel preparation solution. 2 tablet 0   montelukast  (SINGULAIR ) 10 MG tablet TAKE ONE TABLET BY MOUTH AT BEDTIME 30 tablet 5   nicotine  (NICODERM CQ  - DOSED IN MG/24 HOURS) 14 mg/24hr patch Place 1 patch (14 mg total) onto the skin daily. 28 patch 0   ondansetron  (ZOFRAN ) 4 MG tablet Take 1 tablet (4 mg total) by mouth daily as needed for nausea or vomiting. 30 tablet 1   OXYGEN  Inhale 3 L into the lungs continuous.     pantoprazole  (PROTONIX ) 40 MG tablet Take 1 tablet (40 mg total) by mouth 2 (two) times daily. **PLEASE CALL OFFICE TO SCHEDULE FOLLOW UP 60 tablet 0   simvastatin  (ZOCOR ) 40 MG tablet TAKE ONE TABLET BY MOUTH AT BEDTIME (Patient taking differently: Take 40 mg by mouth daily at 6 PM.) 90 tablet 2   traMADol  (ULTRAM ) 50 MG tablet Take 1 tablet (50 mg total) by mouth every 6 (six) hours as needed. 30 tablet 0   TRELEGY ELLIPTA  200-62.5-25  MCG/ACT AEPB INHALE ONE PUFF INTO THE LUNGS DAILY. 60 each 4   triamcinolone  cream (KENALOG ) 0.1 % Apply 1 Application topically daily as needed (irritation).     QUEtiapine  (SEROQUEL ) 100 MG tablet TAKE ONE TABLET BY MOUTH AT BEDTIME 30 tablet 0   QUEtiapine  (SEROQUEL ) 100 MG tablet Take  100 mg by mouth at bedtime.     topiramate  (TOPAMAX ) 100 MG tablet TAKE TWO TABLETS BY MOUTH TWICE DAILY 360 tablet 2   allopurinol (ZYLOPRIM) 100 MG tablet Take 100 mg by mouth daily. (Patient not taking: Reported on 01/12/2024)     oxyCODONE -acetaminophen  (PERCOCET/ROXICET) 5-325 MG tablet Take 1 tablet by mouth every 6 (six) hours as needed for severe pain. (Patient not taking: Reported on 01/12/2024) 15 tablet 0   predniSONE  (DELTASONE ) 10 MG tablet Take 4 tabs for 2 days, then 3 tabs for 2 days, 2 tabs for 2 days, then 1 tab for 2 days, then stop. (Patient not taking: Reported on 01/12/2024) 20 tablet 0   predniSONE  (DELTASONE ) 10 MG tablet Take  4 each am x 2 days,   2 each am x 2 days,  1 each am x 2 days and stop (Patient not taking: Reported on 01/12/2024) 14 tablet 0   No facility-administered medications prior to visit.    PAST MEDICAL HISTORY: Past Medical History:  Diagnosis Date   Allergic rhinitis    Anxiety    Arthritis    "hands and legs and feet" (04/27/2012)   Asthma    Cancer (HCC) 2005   rt arm mole rem cancer   CHF (congestive heart failure) (HCC)    Chronic bronchitis (HCC)    "keep it all the time" (04/27/2012)   Chronic kidney disease    Chronic kidney disease (CKD)    Stage III   COPD (chronic obstructive pulmonary disease) (HCC)    COVID-19 virus infection    03-2021   Depression    PT DENIES   Diverticulosis    DJD (degenerative joint disease)    Emphysema    Epilepsy (HCC)    Epilepsy (HCC)    Esophageal stricture    Exertional dyspnea    GERD (gastroesophageal reflux disease)    Glaucoma    H/O blood clots    Right leg   Heart failure (HCC)    History of COVID-19  03/2021   History of kidney stones    History of nuclear stress test    Myoview  12/25/22: EF 74, no ischemia or infarction, low risk   Hyperlipidemia    Hypertension    IBS (irritable bowel syndrome)    Migraine    Neck pain    Cervical disc   OSA on CPAP    6-7 yrs; pt wears CPAP but has not been able to use it lately due to sinus issues   Oxygen  deficiency    USES 3 LITERS 02 CONTINOUS   Pneumonia 2012; 04/27/2012   Recurrent upper respiratory infection (URI)    Right leg DVT (HCC) 1982   groin   Seizures (HCC) 06/2023   Sleep apnea    Type II diabetes mellitus (HCC)    No meds since weight loss   UTI (urinary tract infection)    2 weeks ago   Vertigo 10/24/2014    PAST SURGICAL HISTORY: Past Surgical History:  Procedure Laterality Date   ABDOMINAL HYSTERECTOMY  2001   "partial"   BIOPSY  04/16/2021   Procedure: BIOPSY;  Surgeon: Sergio Dandy, MD;  Location: [facility];  Service: Endoscopy;;   CARPAL TUNNEL RELEASE  ~ 2008   right   CARPAL TUNNEL RELEASE Right    CATARACT EXTRACTION Bilateral    COLONOSCOPY     COLONOSCOPY N/A 12/24/2023   Procedure: COLONOSCOPY;  Surgeon: Albertina Hugger, MD;  Location: [facility];  Service: Gastroenterology;  Laterality: N/A;   COLONOSCOPY WITH ESOPHAGOGASTRODUODENOSCOPY (EGD)     CYSTOSCOPY/URETEROSCOPY/HOLMIUM LASER/STENT PLACEMENT Left 01/27/2023   Procedure: CYSTOSCOPY LEFT RETROGRADE PYELOGRAM, LEFT URETEROSCOPY, HOLMIUM LASER LITHOTRIPSY, AND LEFT URETERAL STENT PLACEMENT;  Surgeon: Homero Luster, MD;  Location: [facility];  Service: Urology;  Laterality: Left;  60 MINUTES   ESOPHAGOGASTRODUODENOSCOPY (EGD) WITH PROPOFOL  N/A 04/16/2021   Procedure: ESOPHAGOGASTRODUODENOSCOPY (EGD) WITH PROPOFOL ;  Surgeon: Sergio Dandy, MD;  Location: [facility];  Service: Endoscopy;  Laterality: N/A;   KNEE ARTHROSCOPY  1990's   left   NASAL SEPTOPLASTY W/ TURBINOPLASTY Bilateral 05/23/2016   Procedure: NASAL SEPTOPLASTY WITH  TURBINATE REDUCTION;  Surgeon: Ammon Bales, MD;  Location: [facility] OR;  Service: ENT;  Laterality: Bilateral;   SHOULDER OPEN ROTATOR CUFF REPAIR  ~ 2010   left   SINOSCOPY     SINUS ENDO WITH FUSION Right 05/23/2016   Procedure: LIMITED RIGHT ENDOSCOPIC SINUS SURGERY;  Surgeon: Ammon Bales, MD;  Location: [facility] OR;  Service: ENT;  Laterality: Right;   TOTAL KNEE ARTHROPLASTY  08/23/2012   right   TOTAL KNEE ARTHROPLASTY  08/23/2012   Procedure: TOTAL KNEE ARTHROPLASTY;  Surgeon: Genevie Kerns, MD;  Location: [facility] OR;  Service: Orthopedics;  Laterality: Right;  RIGHT KNEE ARTHROPLASTY MEDIAL AND LATERAL COMPARTMENTS WITH PATELLA RESURFACING   TUBAL LIGATION  2001   UPPER GASTROINTESTINAL ENDOSCOPY      FAMILY HISTORY: Family History  Problem Relation Age of Onset   Diabetes Mother    Lung cancer Mother        was a smoker   Stroke Mother    Alcohol abuse Father    Lung cancer Father        was a smoker   Emphysema Father        was a smoker   Heart disease Sister    Other Sister        blood clotting disorder   Diabetes Brother    Seizures Brother    Diabetes Maternal Aunt    Breast cancer Maternal Aunt    Esophageal cancer Maternal Uncle    Diabetes Maternal Uncle    Heart disease Maternal Grandfather    Diabetes Maternal Grandfather    Bipolar disorder Son    Muscular dystrophy Son    Heart disease Son    Heart disease Other        uncle   Colon cancer Neg Hx    Pancreatic cancer Neg Hx    Stomach cancer Neg Hx    Colon polyps Neg Hx    Rectal cancer Neg Hx     SOCIAL HISTORY: Social History   Socioeconomic History   Marital status: Married    Spouse name: Erven Heater   Number of children: 2   Years of education: 12   Highest education level: Not on file  Occupational History   Occupation: disabled  Tobacco Use   Smoking status: Every Day    Current packs/day: 0.25    Average packs/day: 0.5 packs/day for 46.8 years (23.0 ttl pk-yrs)    Types: Cigarettes     Start date: 08/12/1975    Last attempt to quit: 12/30/2020   Smokeless tobacco: Never   Tobacco comments:    Smoking "a few cigarettes per day".  Trying to quit.  01/13/2023  Vaping Use   Vaping status: Never Used  Substance and Sexual Activity   Alcohol use: No    Alcohol/week: 0.0 standard drinks of alcohol   Drug use:  No   Sexual activity: Never  Other Topics Concern   Not on file  Social History Narrative   Patient lives at home with husband Erven Heater, her son and his wife.    Patient has 2 children.    Patient has a high school education.    Patient is currently unemployed.    Patient is right handed.    Patient drinks 2 cups of caffeine per day.   Social Drivers of Corporate investment banker Strain: Low Risk  (05/06/2023)   Received from [facility]   Overall Financial Resource Strain (CARDIA)    Difficulty of Paying Living Expenses: Not hard at all  Food Insecurity: No Food Insecurity (09/16/2023)   Received from [facility]   Hunger Vital Sign    Worried About Running Out of Food in the Last Year: Never true    Ran Out of Food in the Last Year: Never true  Transportation Needs: No Transportation Needs (09/16/2023)   Received from [facility] - Transportation    Lack of Transportation (Medical): No    Lack of Transportation (Non-Medical): No  Physical Activity: Inactive (05/06/2023)   Received from [facility]   Exercise Vital Sign    Days of Exercise per Week: 0 days    Minutes of Exercise per Session: 0 min  Stress: No Stress Concern Present (09/16/2023)   Received from [facility] of Occupational Health - Occupational Stress Questionnaire    Feeling of Stress : Not at all  Social Connections: Socially Integrated (05/06/2023)   Received from [facility]   Social Connection and Isolation Panel [NHANES]    Frequency of Communication with Friends and Family: More than three times a week    Frequency of Social Gatherings with  Friends and Family: Three times a week    Attends Religious Services: 1 to 4 times per year    Active Member of Clubs or Organizations: No    Attends Banker Meetings: 1 to 4 times per year    Marital Status: Married  Catering manager Violence: Not At Risk (05/06/2023)   Received from [facility]   Humiliation, Afraid, Rape, and Kick questionnaire    Fear of Current or Ex-Partner: No    Emotionally Abused: No    Physically Abused: No    Sexually Abused: No   PHYSICAL EXAM  Vitals:   01/12/24 1127  BP: 109/75  Pulse: 74  Resp: 17  SpO2: 98%    There is no height or weight on file to calculate BMI.  Generalized: Well developed, in her wheelchair  Neurological examination  Mentation: Alert oriented to time, place, history taking. Follows all commands speech and language fluent Cranial nerve II-XII: Pupils were equal round reactive to light. Extraocular movements were full, visual field were full on confrontational test. Facial sensation and strength were normal.  Head turning and shoulder shrug  were normal and symmetric. Motor: Good strength of all extremities Sensory: Sensory testing is intact to soft touch on all 4 extremities. No evidence of extinction is noted.  Coordination: Cerebellar testing reveals good finger-nose-finger Gait and station: In wheelchair, not tested Reflexes: Brisk in the BUEs  DIAGNOSTIC DATA (LABS, IMAGING, TESTING) - I reviewed patient records, labs, notes, testing and imaging myself where available.  Lab Results  Component Value Date   WBC 9.7 02/06/2023   HGB 9.4 (L) 02/06/2023   HCT 30.7 (L) 02/06/2023  MCV 100.0 02/06/2023   PLT 250 02/06/2023      Component Value Date/Time   NA 136 02/06/2023 0809   NA 142 05/24/2021 0950   K 4.0 02/06/2023 0809   CL 107 02/06/2023 0809   CO2 22 02/06/2023 0809   GLUCOSE 95 02/06/2023 0809   BUN 21 02/06/2023 0809   BUN 13 05/24/2021 0950   CREATININE 1.11 (H) 02/06/2023 0809    CREATININE 0.91 06/13/2016 1441   CALCIUM  8.6 (L) 02/06/2023 0809   PROT 7.0 02/05/2023 0518   PROT 7.2 08/21/2021 1120   ALBUMIN 2.7 (L) 02/05/2023 0518   ALBUMIN 4.1 08/21/2021 1120   AST 12 (L) 02/05/2023 0518   ALT 10 02/05/2023 0518   ALKPHOS 85 02/05/2023 0518   BILITOT 0.5 02/05/2023 0518   BILITOT <0.2 08/21/2021 1120   GFRNONAA 55 (L) 02/06/2023 0809   GFRNONAA 70 06/13/2016 1441   GFRAA 68 04/10/2020 1418   GFRAA 80 06/13/2016 1441   Lab Results  Component Value Date   CHOL 184 08/21/2021   HDL 91 08/21/2021   LDLCALC 75 08/21/2021   LDLDIRECT 77 09/14/2012   TRIG 106 08/21/2021   CHOLHDL 2.0 08/21/2021   Lab Results  Component Value Date   HGBA1C 5.7 (H) 12/24/2022   Lab Results  Component Value Date   VITAMINB12 588 11/28/2022   Lab Results  Component Value Date   TSH 1.780 12/14/2023   Routine EEG 11/20/21: Mild diffuse slowing.    ASSESSMENT AND PLAN 68 y.o. year old female  has a past medical history of Allergic rhinitis, Anxiety, Arthritis, Asthma, Cancer (HCC) (2005), CHF (congestive heart failure) (HCC), Chronic bronchitis (HCC), Chronic kidney disease, Chronic kidney disease (CKD), COPD (chronic obstructive pulmonary disease) (HCC), COVID-19 virus infection, Depression, Diverticulosis, DJD (degenerative joint disease), Emphysema, Epilepsy (HCC), Epilepsy (HCC), Esophageal stricture, Exertional dyspnea, GERD (gastroesophageal reflux disease), Glaucoma, H/O blood clots, Heart failure (HCC), History of COVID-19 (03/2021), History of kidney stones, History of nuclear stress test, Hyperlipidemia, Hypertension, IBS (irritable bowel syndrome), Migraine, Neck pain, OSA on CPAP, Oxygen  deficiency, Pneumonia (2012; 04/27/2012), Recurrent upper respiratory infection (URI), Right leg DVT (HCC) (1982), Seizures (HCC) (06/2023), Sleep apnea, Type II diabetes mellitus (HCC), UTI (urinary tract infection), and Vertigo (10/24/2014). here with:  1.  Intractable  epilepsy -Continue with Topamax  200 mg twice a day,  -Due to side effects, discontinue Levetiracetam  and Lamotrigine  -Start Lacosamide 50 mg daily for 2 weeks, then increase to 50 mg twice daily  -Recent EEG with mild diffuse slowing, no epileptiform discharges. Will obtain a updated EEG  -Will follow-up in 6 months   2.  Intractable headaches -Headaches are improved, continue with topiramate   3.  Insomnia -Has remained on Seroquel  100 mg at bedtime  4. Cervicalgia/Spondylitic changes and cord compression  -Continue to follow with neurosurgery    Orders Placed This Encounter  Procedures   EEG adult    Cassandra Cleveland, MD 01/12/2024, 2:13 PM [facility] [address], [address] [place], [place] [address] [phone]

## 2024-01-18 ENCOUNTER — Encounter: Payer: Self-pay | Admitting: Cardiovascular Disease

## 2024-01-18 ENCOUNTER — Ambulatory Visit: Payer: 59 | Attending: Cardiology | Admitting: Cardiovascular Disease

## 2024-01-18 VITALS — BP 108/56 | HR 76 | Ht 60.0 in | Wt 192.0 lb

## 2024-01-18 DIAGNOSIS — I5032 Chronic diastolic (congestive) heart failure: Secondary | ICD-10-CM | POA: Diagnosis not present

## 2024-01-18 DIAGNOSIS — E782 Mixed hyperlipidemia: Secondary | ICD-10-CM | POA: Diagnosis not present

## 2024-01-18 DIAGNOSIS — I272 Pulmonary hypertension, unspecified: Secondary | ICD-10-CM

## 2024-01-18 DIAGNOSIS — G4733 Obstructive sleep apnea (adult) (pediatric): Secondary | ICD-10-CM

## 2024-01-18 DIAGNOSIS — F17210 Nicotine dependence, cigarettes, uncomplicated: Secondary | ICD-10-CM

## 2024-01-18 NOTE — Progress Notes (Signed)
 01/18/2024 [place] Latulippe Mcghie   09/05/1955  [id number]  Primary Physician Leitha Purl, MD Primary Cardiologist: Avanell Leigh MD Bennye Bravo, [place]  HPI:  [name] is a 68 y.o.   mildly overweight married Caucasian female mother of 2 children, grandmother of 2 grandchildren whose husband, Erven Heater, is also a patient of mine. She was initially referred for preoperative clearance before elective outpatient sinus surgery.  I last saw her in the office 08/21/2021.  Her cardiovascular risk factors include 40 pack years of tobacco continuing to smoke one pack per day with COPD. She has treated hyperlipidemia as well. She is stable because of a history of epilepsy and COPD she has never had a heart attack or stroke. 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 I performed Cardiolite  stress testing on her in 2005 which is normal and she had stress testing as well/7/14 which was normal. 2-D echo was normal at that time as well without evidence of pulmonary hypertension or valvular heart disease.  Since I saw her in the office 2-1/2 years ago she has remained stable.  She is still in the most part wheelchair-bound because of instability in her feet.  She wears oxygen  at night and is no longer using her CPAP/BiPAP.  She denies chest pain and has stable shortness of breath.  She did have a Myoview  stress test performed a year ago for preoperative clearance before knee surgery which was low risk and nonischemic although she never pursued the surgery.   Current Meds  Medication Sig   albuterol  (PROAIR  HFA) 108 (90 Base) MCG/ACT inhaler Inhale 2 puffs into the lungs every 6 (six) hours as needed for shortness of breath or wheezing.   albuterol  (PROVENTIL ) (2.5 MG/3ML) 0.083% nebulizer solution USE 1 VIAL IN NEBULIZER EVERY 6 HOURS AS NEEDED FOR SHORTNESS OF BREATH (Patient taking differently: Take 2.5 mg by nebulization every 6 (six) hours as  needed for wheezing or shortness of breath.)   ALPRAZolam  (XANAX ) 0.25 MG tablet Take 0.25 mg by mouth 2 (two) times daily.   Cholecalciferol  (VITAMIN D3) 50 MCG (2000 UT) capsule TAKE ONE CAPSULE BY MOUTH DAILY (Patient taking differently: Take 2,000 Units by mouth daily.)   famotidine  (PEPCID ) 40 MG tablet Take 40 mg by mouth at bedtime.   gabapentin  (NEURONTIN ) 100 MG capsule Take 1 capsule (100 mg total) by mouth at bedtime. (Patient taking differently: Take 200 mg by mouth 3 (three) times daily.)   guaiFENesin  (MUCINEX ) 600 MG 12 hr tablet Take 2 tablets (1,200 mg total) by mouth 2 (two) times daily as needed. (Patient taking differently: Take 1,200 mg by mouth 2 (two) times daily as needed for cough or to loosen phlegm.)   ipratropium-albuterol  (DUONEB) 0.5-2.5 (3) MG/3ML SOLN Take 3 mLs by nebulization every 6 (six) hours as needed (Wheezing/sob).   loratadine  (ALLERGY RELIEF) 10 MG tablet Take 1 tablet (10 mg total) by mouth daily.   metoCLOPramide  (REGLAN ) 5 MG tablet Take 1 tablet (5 mg total) by mouth every 12 (twelve) hours as needed for up to 2 doses for nausea. Take 30-45 minutes before evening and AM doses of bowel preparation solution.   montelukast  (SINGULAIR ) 10 MG tablet TAKE ONE TABLET BY MOUTH AT BEDTIME   ondansetron  (ZOFRAN ) 4 MG tablet Take 1 tablet (4 mg total) by mouth daily as needed for nausea or vomiting.   OXYGEN  Inhale 3 L into the lungs  continuous.   pantoprazole  (PROTONIX ) 40 MG tablet Take 1 tablet (40 mg total) by mouth 2 (two) times daily. **PLEASE CALL OFFICE TO SCHEDULE FOLLOW UP   QUEtiapine  (SEROQUEL ) 100 MG tablet Take 1 tablet (100 mg total) by mouth at bedtime.   simvastatin  (ZOCOR ) 40 MG tablet TAKE ONE TABLET BY MOUTH AT BEDTIME (Patient taking differently: Take 40 mg by mouth daily at 6 PM.)   topiramate  (TOPAMAX ) 100 MG tablet Take 2 tablets (200 mg total) by mouth 2 (two) times daily.   traMADol  (ULTRAM ) 50 MG tablet Take 1 tablet (50 mg total) by mouth  every 6 (six) hours as needed.   triamcinolone  cream (KENALOG ) 0.1 % Apply 1 Application topically daily as needed (irritation).     Allergies  Allergen Reactions   Amoxicillin Anaphylaxis    Breakout, mouth swells   Azithromycin Swelling and Rash    "swelling all over, hives, thrush"   Cefdinir      Throat swelling after 3rd dose   Chantix  [Varenicline ] Other (See Comments)    "bad dreams, difficulty breathing"   Clindamycin  Anaphylaxis   Doxycycline  Hyclate Anaphylaxis and Rash    swelling   Levofloxacin  Anaphylaxis   Paxlovid  [Nirmatrelvir -Ritonavir ] Swelling    Throat swelling   Penicillins Anaphylaxis, Shortness Of Breath and Rash    "ALMOST DIED, ended up in hospital"    Sulfonamide Derivatives Shortness Of Breath, Swelling and Rash    "break out from head to toe"   Toradol  [Ketorolac  Tromethamine ] Other (See Comments)    Seizure    Clarithromycin Hives    swelling   Nortriptyline Swelling, Rash and Other (See Comments)    Per patient had "kidney problems" on this medication, could not use bathroom (urinary retention)   Moxifloxacin     Codeine  Nausea And Vomiting   Fluticasone -Salmeterol Other (See Comments)    REACTION: ulcers in mouth   Triptans Rash    Mouth breaks out and start itching    Social History   Socioeconomic History   Marital status: Married    Spouse name: Erven Heater   Number of children: 2   Years of education: 12   Highest education level: Not on file  Occupational History   Occupation: disabled  Tobacco Use   Smoking status: Every Day    Current packs/day: 0.25    Average packs/day: 0.5 packs/day for 46.8 years (23.1 ttl pk-yrs)    Types: Cigarettes    Start date: 08/12/1975    Last attempt to quit: 12/30/2020   Smokeless tobacco: Never   Tobacco comments:    Smoking "a few cigarettes per day".  Trying to quit.  01/13/2023  Vaping Use   Vaping status: Never Used  Substance and Sexual Activity   Alcohol use: No    Alcohol/week: 0.0 standard  drinks of alcohol   Drug use: No   Sexual activity: Never  Other Topics Concern   Not on file  Social History Narrative   Patient lives at home with husband Erven Heater, her son and his wife.    Patient has 2 children.    Patient has a high school education.    Patient is currently unemployed.    Patient is right handed.    Patient drinks 2 cups of caffeine per day.   Social Drivers of Corporate investment banker Strain: Low Risk  (05/06/2023)   Received from [facility]   Overall Financial Resource Strain (CARDIA)    Difficulty of Paying Living Expenses: Not hard at all  Food Insecurity: No Food Insecurity (09/16/2023)   [facility]   Hunger Vital Sign    Worried About Running Out of Food in the Last Year: Never true    Ran Out of Food in the Last Year: Never true  Transportation Needs: No Transportation Needs (09/16/2023)   Received from [facility] - Transportation    Lack of Transportation (Medical): No    Lack of Transportation (Non-Medical): No  Physical Activity: Inactive (05/06/2023)   Received from [facility]   Exercise Vital Sign    Days of Exercise per Week: 0 days    Minutes of Exercise per Session: 0 min  Stress: No Stress Concern Present (09/16/2023)   Received from [facility] - Occupational Stress Questionnaire    Feeling of Stress : Not at all  Social Connections: Socially Integrated (05/06/2023)   Received from [facility]   Social Connection and Isolation Panel [NHANES]    Frequency of Communication with Friends and Family: More than three times a week    Frequency of Social Gatherings with Friends and Family: Three times a week    Attends Religious Services: 1 to 4 times per year    Active Member of Clubs or Organizations: No    Attends Banker Meetings: 1 to 4 times per year    Marital Status: Married  Catering manager Violence: Not At Risk (05/06/2023)    Received from [facility]   Humiliation, Afraid, Rape, and Kick questionnaire    Fear of Current or Ex-Partner: No    Emotionally Abused: No    Physically Abused: No    Sexually Abused: No     Review of Systems: General: negative for chills, fever, night sweats or weight changes.  Cardiovascular: negative for chest pain, dyspnea on exertion, edema, orthopnea, palpitations, paroxysmal nocturnal dyspnea or shortness of breath Dermatological: negative for rash Respiratory: negative for cough or wheezing Urologic: negative for hematuria Abdominal: negative for nausea, vomiting, diarrhea, bright red blood per rectum, melena, or hematemesis Neurologic: negative for visual changes, syncope, or dizziness All other systems reviewed and are otherwise negative except as noted above.    Blood pressure (!) 108/56, pulse 76, height 5' (1.524 m), weight 192 lb (87.1 kg), SpO2 95%.  General appearance: alert and no distress Neck: no adenopathy, no carotid bruit, no JVD, supple, symmetrical, trachea midline, and thyroid  not enlarged, symmetric, no tenderness/mass/nodules Lungs: clear to auscultation bilaterally Heart: regular rate and rhythm, S1, S2 normal, no murmur, click, rub or gallop Extremities: extremities normal, atraumatic, no cyanosis or edema Pulses: 2+ and symmetric Skin: Skin color, texture, turgor normal. No rashes or lesions Neurologic: Grossly normal  EKG EKG Interpretation Date/Time:  Monday January 18 2024 09:25:06 EDT Ventricular Rate:  73 PR Interval:  182 QRS Duration:  68 QT Interval:  368 QTC Calculation: 405 R Axis:   74  Text Interpretation: Normal sinus rhythm Normal ECG When compared with ECG of 25-May-2023 13:44, No significant change was found Confirmed by Lauro Portal [phone]) on 01/18/2024 9:34:28 AM    ASSESSMENT AND PLAN:   Mixed hyperlipidemia History of hyperlipidemia on statin therapy with lipid profile performed 08/21/2020 revealing total cholesterol  184, LDL 75 HDL 91.  Cigarette smoker Ongoing tobacco abuse of 1/2 pack/day recalcitrant to risk factor modification.  Obstructive sleep apnea History of obstructive sleep apnea no longer on CPAP/BiPAP     Frederico Jan.  Katheryne Pane MD [facility], [facility] 01/18/2024 9:45 AM

## 2024-01-18 NOTE — Patient Instructions (Signed)
 Medication Instructions:  Your physician recommends that you continue on your current medications as directed. Please refer to the Current Medication list given to you today.  *If you need a refill on your cardiac medications before your next appointment, please call your pharmacy*  Follow-Up: At [facility], you and your health needs are our priority.  As part of our continuing mission to provide you with exceptional heart care, our providers are all part of one team.  This team includes your primary Cardiologist (physician) and Advanced Practice Providers or APPs (Physician Assistants and Nurse Practitioners) who all work together to provide you with the care you need, when you need it.  Your next appointment:   12 month(s)  Provider:   Lauro Portal, MD    We recommend signing up for the patient portal called "MyChart".  Sign up information is provided on this After Visit Summary.  MyChart is used to connect with patients for Virtual Visits (Telemedicine).  Patients are able to view lab/test results, encounter notes, upcoming appointments, etc.  Non-urgent messages can be sent to your provider as well.   To learn more about what you can do with MyChart, go to [email or web address].

## 2024-01-18 NOTE — Assessment & Plan Note (Signed)
Ongoing tobacco abuse of 1/2 pack/day recalcitrant to risk factor modification. 

## 2024-01-18 NOTE — Assessment & Plan Note (Signed)
 History of obstructive sleep apnea no longer on CPAP/BiPAP

## 2024-01-18 NOTE — Assessment & Plan Note (Signed)
 History of hyperlipidemia on statin therapy with lipid profile performed 08/21/2020 revealing total cholesterol 184, LDL 75 HDL 91.

## 2024-01-19 ENCOUNTER — Ambulatory Visit (INDEPENDENT_AMBULATORY_CARE_PROVIDER_SITE_OTHER): Admitting: Neurology

## 2024-01-19 DIAGNOSIS — G40909 Epilepsy, unspecified, not intractable, without status epilepticus: Secondary | ICD-10-CM | POA: Diagnosis not present

## 2024-01-19 NOTE — Procedures (Signed)
    History:  68 year old woman with seizure   EEG classification: Awake and drowsy  Duration: 25 minutes  Technical aspects: This EEG study was done with scalp electrodes positioned according to the 10-20 International system of electrode placement. Electrical activity was reviewed with band pass filter of 1-70Hz , sensitivity of 7 uV/mm, display speed of 22mm/sec with a 60Hz  notched filter applied as appropriate. EEG data were recorded continuously and digitally stored.   Description of the recording: The background rhythms of this recording consists of a fairly well modulated medium amplitude alpha rhythm of 8.5 Hz that is reactive to eye opening and closure. Present in the anterior head region is a 15-20 Hz beta activity. Photic stimulation was performed, did not show any abnormalities. Hyperventilation was not performed. Drowsiness was manifested by background fragmentation. No abnormal epileptiform discharges seen during this recording. There was right parieto-occipital focal slowing. There were no electrographic seizure identified.   Abnormality: Intermittent Right parieto-occipital focal slowing    Impression: This is an abnormal awake and drowsy EEG due to presence of right parieto-occipital intermittent slowing. 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    Mariano Doshi, MD [facility]

## 2024-01-22 ENCOUNTER — Ambulatory Visit: Payer: Self-pay | Admitting: Neurology

## 2024-01-22 ENCOUNTER — Other Ambulatory Visit: Payer: Self-pay | Admitting: Neurology

## 2024-01-24 NOTE — Progress Notes (Addendum)
 Subjective:   Patient ID: [name], female    DOB: 1955/10/12 MRN: [id number]  HPI  70  yowf active smoker with dx of copd aware of sob around 2000 [facility] with exacerbation every few months just disharged from [facility] in 04/2012 for ? CAP with aecopd referred by Dr Watson Hacking for preop for R TKR Jan 13 14   06/18/2012 Pulmonary preop cc doe x one aisle and since last flare using inhaler saba maybe twice weekly and sleeping ok on cpap maintained on spiriva  and can't take advair due to mouth irritation.  Min am cough/ congestion with thick white mucus.  rec Try to minimize smoking Try tudorza one puff twice daily instead of spiriva  on trial basis for 2 weeks - call if you like it better and we'll send you a prescription       05/12/2016  f/u ov/Brilynn Biasi re:  preop eval for sinus surgery / no med calendar  Chief Complaint  Patient presents with   Follow-up    Pt. says she is not having any sob, coughing with green phlegm, no chest pain  very poor insight into meds / still smoking/ main problem is am cough/ congestion not using mucinex  / flutter as rec  Doe = MMRC2 = can't walk a nl pace on a flat grade s sob but does fine slow and flat eg shopping Rec  Work on inhaler technique:  relax and gently blow all the way out then take a nice smooth deep breath back in, triggering the inhaler at same time you start breathing in.  Hold for up to 5 seconds if you can. Blow out thru nose. Rinse and gargle with water when done Plan A = Automatic = dulera  200 Take 2 puffs first thing in am and then another 2 puffs about 12 hours later.  Plan B = Backup Only use your albuterol  (proair ) as a rescue medication  Plan C = Crisis - only use your albuterol  nebulizer if you first try Plan B and it fails to help > ok to use the nebulizer up to every 4 hours but if start needing it regularly call for immediate appointment Pulmonary follow up is as needed - bring your medications with you    [facility] Admit: Admit  date: 09/18/2022 Discharge date: 09/21/2022 Pneumonia of both lungs due to infectious organism COPD (chronic obstructive pulmonary disease) (CMS-HCC) Chronic diastolic heart failure (CMS-HCC) Pulmonary hypertension (CMS-HCC) HLD (hyperlipidemia) Seizure disorder (CMS-HCC) Altered mental status Tobacco dependence 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 female with PMHx as reviewed in the EMR that presented to [facility] and is being admitted for Pneumonia of both lungs due to infectious organism. Patient brought to the ED by EMS today after being found unresponsive by her family. Complains of cough and shortness of breath. On arrival patient's pO2 was 49. Patient also notes she has been having difficulty urinating the past couple of days with scant urine. Wears oxygen  at home, 3 L. CPAP at night.Past medical history includes COPD, diastolic heart failure, bad knees, chronic neck and back pain, home oxygen  use, GERD, seizure disorder, hyperlipidemia. [facility]. She was recently hospitalized at  [facility] 12/26 through 12/29 with pneumonia. Today's findings include elevated WBC 19.2, creatinine 1.23, pO2 49. Chest x-ray  c/w chronic pulmonary infiltrates, unchanged. CTA of the chest shows multifocal alveolar process likely infectious. Larger area of alveolar consolidation in the left upper lobe. Nonspecific hilar and mediastinal adenopathy. Viral testing negative. Temperature 103.7, tachycardic 120s. Patient has clinically improved, has been treated with IV antibiotics including vancomycin , and aztreonam . Patient has been evaluated by PT, recommended SNF, however  patient would like to be discharged home states that her husband and daughter can care for her.  Detailed hospital close/management mentioned below:  Pneumonia of both lungs due to infectious organism, leukocytosis, COPD, sleep apnea Clinically improving, speaking complete sentences. WBC was trended down to 7.8. Blood cultures x 2 no growth 48 hours. Continue vancomycin  and aztreonam  that were initiated in the [place] for pulmonary toiletry. Will discharge on clindamycin  450 mg every 8 hours x 5 days. Oxygen  support, has been refusing CPAP at night Nebulizers every 6 hours  Chronic diastolic heart failure Monitor IV fluid quantities Monitor for fluid overload Echocardiogram 1/23 normal EF 60 to 65% with G1 DD She is not on home diuretics  Left lower extremity DVT Continue Xarelto  Doppler 2/9 confirms DVT in the left common femoral vein Will need outpt Vascular Clinic Referral       09/25/2022  re-establish s/p Admit: f/u ov/[place] office/ [place] re: GOLD 3 copd / 02 dep and still smoking  maint on telegy  / DNI status per last admit.  Chief Complaint  Patient presents with   Hospitalization Follow-up    HFU [facility] 2/8 pneumonia and sepsis  AP 12/26-12/29 COPD    Dyspnea:  unsteady on feet/ clot L leg x one year and leg stayed swollen since   Cough: slt slt cream  Sleeping: hosp bed 10 degrees  SABA use: way too much 02: 3lpm 24/7  Rec Plan A = Automatic = Always=    Trelegy 100 one click each am   Plan B = Backup (to supplement plan A, not to replace it) Only use your albuterol  inhaler as a rescue medication Plan C = Crisis (instead of Plan B but only if Plan B stops working) - only use your albuterol  nebulizer if you first try Plan B and it fails to help > ok to use the nebulizer up to every 4 hours but if start needing it regularly call for immediate appointment Also  Ok to try albuterol  15 min before an activity (on alternating days)  that you know would usually make you  short of breath and see if it makes any difference and if makes none then don't take albuterol  after activity unless you can't catch your breath as this means it's the resting that helps, not the albuterol . After use of Trelegy use arm and hammer tooth paste Call your pharmacist to fine out what the antibiotic was that could tolerate that actually worked  Make sure you check your oxygen  saturation  AT  your highest level of activity (not after you stop)   to be sure it stays over 90%  The key is to stop smoking completely before smoking completely stops you! For cough/ congestion > mucinex  1200 mg every 12hours as need and flutter valve as much as possible  and if can't stop coughing try to switch entirely to the nebulizer up to every 4 hours as needed    06/24/2023  [place] pt  re-establishing  ov/[place] office/Cormac Wint re: GOLD 3 copd/02 dep  maint on Trelegy   did not go for fu cxr on last SOOD  ov 02/16/23  Chief Complaint  Patient presents with   COPD GOLD 3  Dyspnea:  limited by L knee to motorized w/c Cough: worse cough since 06/22/23 > green sputum Sleeping: hob up 30 degrees cough x sev days acutely - was not having trouble prior to flare SABA use: twice daily  02: 3lpm bed time and prn daytime  Rec Cipro  500 mg twice daily x  10days  Prednisone  10 mg take  4 each am x 2 days,   2 each am x 2 days,  1 each am x 2 days and stop  Plan A = Automatic = Always=    Trelegy 100  Plan B = Backup (to supplement plan A, not to replace it) Only use your albuterol  inhaler as a rescue medication  Plan C = Crisis (instead of Plan B but only if Plan B stops working) - only use your albuterol  nebulizer if you first try Plan B Make sure you check your oxygen  saturation  AT  your highest level of activity (not after you stop)   to be sure it stays over 90%         09/10/2023  ACUTE  ov/[place] office/Kathryn Linarez re: GOLD 3 copd  maint on trelegy 100 and 02 3lpm   Chief Complaint  Patient presents with    Acute Visit    Pt is having sob and coughing mucus   Dyspnea:  worse x 4 days  Cough: yellow dark thick  Sleeping:  30 degrees  only needed to use rescue x one  SABA use: last neb 3 h   02: 3lpm  24 7  Lung cancer screening: today  Rec Cipro  500 mg twice daily x  10days  Prednisone  10 mg take  4 each am x 2 days,   2 each am x 2 days,  1 each am x 2 days and stop  Plan A = Automatic = Always=    Trelegy 100  Plan B = Backup (to supplement plan A, not to replace it) Only use your albuterol  inhaler as a rescue medication   Plan C = Crisis (instead of Plan B but only if Plan B stops working) - only use your albuterol  nebulizer if you first try Plan B and it fails to help > ok to use the nebulizer up to every 4 hours but if start needing it regularly call for immediate appointment Make sure you check your oxygen  saturation  AT  your highest level of activity (not after you stop)   to be sure it stays over 90%   You need to be off cigarettes x 2 weeks and cxr needs to be cleared before I can clear you surgery    Please schedule a follow up office visit in 6 weeks, call sooner if needed  - to see nurse practitioner   10/22/2023  f/u ov/[place] office/Helina Hullum re: GOLD 3 copd/ maint on trelegy   Chief Complaint  Patient presents with   Follow-up    6 week follow up    Dyspnea: can't walk due to L Knee pain  Cough: none  Sleeping: 30 degrees Hosp bed s  resp cc  SABA use: twice daily  hfa/ neb rarely  02: 3lpm at / none at rest/ 3lpm continuous  Lung cancer screening: last CTa 09/18/22 s nodules  Rec No smoking between now and surgery  Make sure you check your  oxygen  saturation  AT  your highest level of activity (not after you stop)   to be sure it stays over 90%   You are cleared for surgery - need to focus on post op mobilization and minimize use of pain medications  Please schedule a follow up visit in 3 months but call sooner if needed     01/25/2024  f/u ov/[place]  office/Roberto Romanoski re: GOLD 3 COPD  maint on Trelegy / never had knee surgery   you wouldn't clear me s cxr  Chief Complaint  Patient presents with   Follow-up    Shob / cough  Dyspnea:  across the room  Cough: little bit of a rattle / minimal mucoid sputum  Sleeping: 30 degrees hosp bed    resp cc  SABA use: once a day / no neb  02:  3lpm hs and prn   Lung cancer screening: referred today    No obvious day to day or daytime variability or assoc excess/ purulent sputum or mucus plugs or hemoptysis or cp or chest tightness, subjective wheeze or overt  hb symptoms.    Also denies any obvious fluctuation of symptoms with weather or environmental changes or other aggravating or alleviating factors except as outlined above   No unusual exposure hx or h/o childhood pna/ asthma or knowledge of premature birth.  Current Allergies, Complete Past Medical History, Past Surgical History, Family History, and Social History were reviewed in [employer] record.  ROS  The following are not active complaints unless bolded Hoarseness, sore throat, dysphagia, dental problems, itching, sneezing,  nasal congestion or discharge of excess mucus or purulent secretions, ear ache,   fever, chills, sweats, unintended wt loss or wt gain, classically pleuritic or exertional cp,  orthopnea pnd or arm/hand swelling  or leg swelling, presyncope, palpitations, abdominal pain, anorexia, nausea, vomiting, diarrhea  or change in bowel habits or change in bladder habits, change in stools or change in urine, dysuria, hematuria,  rash, arthralgias, visual complaints, headache, numbness, weakness or ataxia or problems with walking or coordination,  change in mood or  memory.        Current Meds  Medication Sig   albuterol  (PROAIR  HFA) 108 (90 Base) MCG/ACT inhaler Inhale 2 puffs into the lungs every 6 (six) hours as needed for shortness of breath or wheezing.   albuterol  (PROVENTIL ) (2.5 MG/3ML) 0.083%  nebulizer solution USE 1 VIAL IN NEBULIZER EVERY 6 HOURS AS NEEDED FOR SHORTNESS OF BREATH (Patient taking differently: Take 2.5 mg by nebulization every 6 (six) hours as needed for wheezing or shortness of breath.)   ALPRAZolam  (XANAX ) 0.25 MG tablet Take 0.25 mg by mouth 2 (two) times daily.   Cholecalciferol  (VITAMIN D3) 50 MCG (2000 UT) capsule TAKE ONE CAPSULE BY MOUTH DAILY (Patient taking differently: Take 2,000 Units by mouth daily.)   famotidine  (PEPCID ) 40 MG tablet Take 40 mg by mouth at bedtime.   gabapentin  (NEURONTIN ) 100 MG capsule Take 1 capsule (100 mg total) by mouth at bedtime. (Patient taking differently: Take 200 mg by mouth 3 (three) times daily.)   guaiFENesin  (MUCINEX ) 600 MG 12 hr tablet Take 2 tablets (1,200 mg total) by mouth 2 (two) times daily as needed. (Patient taking differently: Take 1,200 mg by mouth 2 (two) times daily as needed for cough or to loosen phlegm.)   ipratropium-albuterol  (DUONEB) 0.5-2.5 (3) MG/3ML SOLN Take 3 mLs by nebulization every 6 (six) hours as needed (Wheezing/sob).   loratadine  (ALLERGY RELIEF) 10  MG tablet Take 1 tablet (10 mg total) by mouth daily.   metoCLOPramide  (REGLAN ) 5 MG tablet Take 1 tablet (5 mg total) by mouth every 12 (twelve) hours as needed for up to 2 doses for nausea. Take 30-45 minutes before evening and AM doses of bowel preparation solution.   montelukast  (SINGULAIR ) 10 MG tablet TAKE ONE TABLET BY MOUTH AT BEDTIME   ondansetron  (ZOFRAN ) 4 MG tablet Take 1 tablet (4 mg total) by mouth daily as needed for nausea or vomiting.   OXYGEN  Inhale 3 L into the lungs continuous.   pantoprazole  (PROTONIX ) 40 MG tablet Take 1 tablet (40 mg total) by mouth 2 (two) times daily. **PLEASE CALL OFFICE TO SCHEDULE FOLLOW UP   QUEtiapine  (SEROQUEL ) 100 MG tablet Take 1 tablet (100 mg total) by mouth at bedtime.   simvastatin  (ZOCOR ) 40 MG tablet TAKE ONE TABLET BY MOUTH AT BEDTIME (Patient taking differently: Take 40 mg by mouth daily at 6  PM.)   topiramate  (TOPAMAX ) 100 MG tablet Take 2 tablets (200 mg total) by mouth 2 (two) times daily.   traMADol  (ULTRAM ) 50 MG tablet Take 1 tablet (50 mg total) by mouth every 6 (six) hours as needed.   triamcinolone  cream (KENALOG ) 0.1 % Apply 1 Application topically daily as needed (irritation).                   Objective:   Physical Exam  Wts  01/25/2024       193   10/22/2023       191  09/10/2023       185 06/24/2023     180  09/25/22 174 lb (78.9 kg)  08/07/22 177 lb 14.6 oz (80.7 kg)  07/10/22 176 lb (79.8 kg)    Vital signs reviewed  01/25/2024  - Note at rest 02 sats  95% on RA   General appearance:    w/c bound eldelry wf smoker's rattle/ min rhonchi    HEENT : Oropharynx  clear       NECK :  without  apparent JVD/ palpable Nodes/TM    LUNGS: no acc muscle use,  Mild barrel  contour chest wall with bilateral  Distant rhonci but no audible wheeze and  without cough on insp or exp maneuvers  and mild  Hyperresonant  to  percussion bilaterally     CV:  RRR  no s3 or murmur or increase in P2, and no edema   ABD:  soft and nontender with pos end  insp Hoover's  in the supine position.  No bruits or organomegaly appreciated   MS:   ext warm without deformities calf tenderness, cyanosis or clubbing    SKIN: warm and dry without lesions    NEURO:  alert, approp, nl sensorium with  no motor or cerebellar deficits apparent.       CXR PA and Lateral:   01/25/2024 :    I personally reviewed images and impression is as follows:     Will go when returns for preop labs  Assessment & Plan:

## 2024-01-25 ENCOUNTER — Ambulatory Visit (INDEPENDENT_AMBULATORY_CARE_PROVIDER_SITE_OTHER): Admitting: Internal Medicine

## 2024-01-25 ENCOUNTER — Encounter: Payer: Self-pay | Admitting: Internal Medicine

## 2024-01-25 VITALS — BP 90/62 | HR 74 | Ht 60.0 in | Wt 193.0 lb

## 2024-01-25 DIAGNOSIS — R911 Solitary pulmonary nodule: Secondary | ICD-10-CM | POA: Diagnosis not present

## 2024-01-25 DIAGNOSIS — J9611 Chronic respiratory failure with hypoxia: Secondary | ICD-10-CM | POA: Diagnosis not present

## 2024-01-25 DIAGNOSIS — F17210 Nicotine dependence, cigarettes, uncomplicated: Secondary | ICD-10-CM | POA: Diagnosis not present

## 2024-01-25 DIAGNOSIS — J449 Chronic obstructive pulmonary disease, unspecified: Secondary | ICD-10-CM

## 2024-01-25 NOTE — Assessment & Plan Note (Signed)
 Active smoker  - as of 09/25/22 on 3lpm 247 with nl HC03 but hct 28%  -  as of 01/25/2024 on 3lpm hs and prn with HC03 = 25 on 09/18/23

## 2024-01-25 NOTE — Patient Instructions (Addendum)
 Plan A = Automatic = Always=    Trelegy 100 each am   Plan B = Backup (to supplement plan A, not to replace it) Only use your albuterol  inhaler as a rescue medication to be used if you can't catch your breath by resting or doing a relaxed purse lip breathing pattern.  - The less you use it, the better it will work when you need it. - Ok to use the inhaler up to 2 puffs  every 4 hours if you must but call for appointment if use goes up over your usual need - Don't leave home without it !!  (think of it like the spare tire for your car)   Plan C = Crisis (instead of Plan B but only if Plan B stops working) - only use your albuterol  nebulizer if you first try Plan B and it fails to help > ok to use the nebulizer up to every 4 hours but if start needing it regularly call for immediate appointment   Be sure you don't use the albuterol / ipatropium as your plan nebulizer   My office will be contacting you by phone for referral to lung cancer screening   [phone]) - if you don't hear back from my office within one week,  please call us  back or notify us  thru MyChart and we'll address it right away.     Please remember to go to the  x-ray department  @  [facility] for your tests - we will call you with the results when they are available  and clear you for surgery but you will need to stop smoking for 2 weeks

## 2024-01-25 NOTE — Assessment & Plan Note (Addendum)
 Limits of effective care reviewed in pulmonary clinic 08/06/2012 by Waymond Hailey   Discussed need to at least stop smoking between now and planned knee surgery   Each maintenance medication was reviewed in detail including emphasizing most importantly the difference between maintenance and prns and under what circumstances the prns are to be triggered using an action plan format where appropriate.  Total time for H and P, chart review, counseling, reviewing hfa / DPI /02  device(s) and generating customized AVS unique to this office visit / same day charting = 33 min

## 2024-01-25 NOTE — Assessment & Plan Note (Signed)
 Active smoker  -  PFT's  09/27/21  FEV1 1.05 (47 % ) ratio 0.58  p 5 % improvement from saba p ? prior to study with DLCO  9.41 (52%)   and FV curve mildly concave and ERV 51 at wt 189     She is moderately high risk for post op pulmonary complications that she and her fm need to acknowledge and accept. The only thing we can add pre op is ask to for a cxr today which will return for later in the week and stop all smoking preop/ advised.

## 2024-01-26 NOTE — Progress Notes (Signed)
 Name: [name] DOB: 01/16/1956 MRN: [id number]  History of Present Illness: Ms. Kohn is a 68 y.o. female who presents today for follow up visit at [facility] [place]. Relevant History includes: 1. Recurrent UTIs.  2. Kidney stones. - 01/27/2023: Underwent left ureteroscopic stone manipulation by Dr. Inga Manges. - 02/19/2023: Lindsay Rho analysis showed 90% calcium  oxalate and 10% hydroxyapatite composition.  - 03/12/2023: Normal RUS - no stones, masses, or hydronephrosis.    At last visit on 05/05/2023: Doing well.   Today: KUB today: Awaiting radiology read; 4 mm left intrarenal stone appreciated per provider interpretation.  She denies recent stone passage. She denies flank pain. She reports some RLQ abdominal pain for the past few days.  She denies increased urinary urgency, frequency, nocturia, dysuria, gross hematuria, hesitancy, straining to void, or sensations of incomplete emptying.  She reports 0 UTIs in the past 12 months.  Medications: Current Outpatient Medications  Medication Sig Dispense Refill   albuterol  (PROAIR  HFA) 108 (90 Base) MCG/ACT inhaler Inhale 2 puffs into the lungs every 6 (six) hours as needed for shortness of breath or wheezing. 18 g 6   albuterol  (PROVENTIL ) (2.5 MG/3ML) 0.083% nebulizer solution USE 1 VIAL IN NEBULIZER EVERY 6 HOURS AS NEEDED FOR SHORTNESS OF BREATH (Patient taking differently: Take 2.5 mg by nebulization every 6 (six) hours as needed for wheezing or shortness of breath.) 75 mL 8   ALPRAZolam  (XANAX ) 0.25 MG tablet Take 0.25 mg by mouth 2 (two) times daily.     Cholecalciferol  (VITAMIN D3) 50 MCG (2000 UT) capsule TAKE ONE CAPSULE BY MOUTH DAILY (Patient taking differently: Take 2,000 Units by mouth daily.) 90 capsule 1   famotidine  (PEPCID ) 40 MG tablet Take 40 mg by mouth at bedtime.     gabapentin  (NEURONTIN ) 100 MG capsule Take 1 capsule (100 mg total) by mouth at bedtime. (Patient taking differently: Take 200 mg by mouth 3  (three) times daily.) 90 capsule 3   guaiFENesin  (MUCINEX ) 600 MG 12 hr tablet Take 2 tablets (1,200 mg total) by mouth 2 (two) times daily as needed. (Patient taking differently: Take 1,200 mg by mouth 2 (two) times daily as needed for cough or to loosen phlegm.) 60 tablet 5   ipratropium-albuterol  (DUONEB) 0.5-2.5 (3) MG/3ML SOLN Take 3 mLs by nebulization every 6 (six) hours as needed (Wheezing/sob).     loratadine  (ALLERGY RELIEF) 10 MG tablet Take 1 tablet (10 mg total) by mouth daily. 90 tablet 3   metoCLOPramide  (REGLAN ) 5 MG tablet Take 1 tablet (5 mg total) by mouth every 12 (twelve) hours as needed for up to 2 doses for nausea. Take 30-45 minutes before evening and AM doses of bowel preparation solution. 2 tablet 0   montelukast  (SINGULAIR ) 10 MG tablet TAKE ONE TABLET BY MOUTH AT BEDTIME 30 tablet 5   ondansetron  (ZOFRAN ) 4 MG tablet Take 1 tablet (4 mg total) by mouth daily as needed for nausea or vomiting. 30 tablet 1   OXYGEN  Inhale 3 L into the lungs continuous.     pantoprazole  (PROTONIX ) 40 MG tablet Take 1 tablet (40 mg total) by mouth 2 (two) times daily. **PLEASE CALL OFFICE TO SCHEDULE FOLLOW UP 60 tablet 0   QUEtiapine  (SEROQUEL ) 100 MG tablet Take 1 tablet (100 mg total) by mouth at bedtime. 30 tablet 11   simvastatin  (ZOCOR ) 40 MG tablet TAKE ONE TABLET BY MOUTH AT BEDTIME (Patient taking differently: Take 40 mg by mouth daily at 6 PM.) 90 tablet 2  topiramate  (TOPAMAX ) 100 MG tablet Take 2 tablets (200 mg total) by mouth 2 (two) times daily. 360 tablet 2   traMADol  (ULTRAM ) 50 MG tablet Take 1 tablet (50 mg total) by mouth every 6 (six) hours as needed. 30 tablet 0   triamcinolone  cream (KENALOG ) 0.1 % Apply 1 Application topically daily as needed (irritation).     No current facility-administered medications for this visit.    Allergies: Allergies  Allergen Reactions   Amoxicillin Anaphylaxis    Breakout, mouth swells   Azithromycin Swelling and Rash    swelling  all over, hives, thrush   Cefdinir      Throat swelling after 3rd dose   Chantix  [Varenicline ] Other (See Comments)    bad dreams, difficulty breathing   Clindamycin  Anaphylaxis   Doxycycline  Hyclate Anaphylaxis and Rash    swelling   Levofloxacin  Anaphylaxis   Paxlovid  [Nirmatrelvir -Ritonavir ] Swelling    Throat swelling   Penicillins Anaphylaxis, Shortness Of Breath and Rash    ALMOST DIED, ended up in hospital    Sulfonamide Derivatives Shortness Of Breath, Swelling and Rash    break out from head to toe   Toradol  [Ketorolac  Tromethamine ] Other (See Comments)    Seizure    Clarithromycin Hives    swelling   Nortriptyline Swelling, Rash and Other (See Comments)    Per patient had kidney problems on this medication, could not use bathroom (urinary retention)   Moxifloxacin     Codeine  Nausea And Vomiting   Fluticasone -Salmeterol Other (See Comments)    REACTION: ulcers in mouth   Triptans Rash    Mouth breaks out and start itching    Past Medical History:  Diagnosis Date   Allergic rhinitis    Anxiety    Arthritis    hands and legs and feet (04/27/2012)   Asthma    Cancer (HCC) 2005   rt arm mole rem cancer   CHF (congestive heart failure) (HCC)    Chronic bronchitis (HCC)    keep it all the time (04/27/2012)   Chronic kidney disease    Chronic kidney disease (CKD)    Stage III   COPD (chronic obstructive pulmonary disease) (HCC)    COVID-19 virus infection    03-2021   Depression    PT DENIES   Diverticulosis    DJD (degenerative joint disease)    Emphysema    Epilepsy (HCC)    Epilepsy (HCC)    Esophageal stricture    Exertional dyspnea    GERD (gastroesophageal reflux disease)    Glaucoma    H/O blood clots    Right leg   Heart failure (HCC)    History of COVID-19 03/2021   History of kidney stones    History of nuclear stress test    Myoview  12/25/22: EF 74, no ischemia or infarction, low risk   Hyperlipidemia    Hypertension    IBS  (irritable bowel syndrome)    Migraine    Neck pain    Cervical disc   OSA on CPAP    6-7 yrs; pt wears CPAP but has not been able to use it lately due to sinus issues   Oxygen  deficiency    USES 3 LITERS 02 CONTINOUS   Pneumonia 2012; 04/27/2012   Recurrent upper respiratory infection (URI)    Right leg DVT (HCC) 1982   groin   Seizures (HCC) 06/2023   Sleep apnea    Type II diabetes mellitus (HCC)    No meds since weight loss  UTI (urinary tract infection)    2 weeks ago   Vertigo 10/24/2014   Past Surgical History:  Procedure Laterality Date   ABDOMINAL HYSTERECTOMY  2001   partial   BIOPSY  04/16/2021   Procedure: BIOPSY;  Surgeon: Sergio Dandy, MD;  Location: [facility];  Service: Endoscopy;;   CARPAL TUNNEL RELEASE  ~ 2008   right   CARPAL TUNNEL RELEASE Right    CATARACT EXTRACTION Bilateral    COLONOSCOPY     COLONOSCOPY N/A 12/24/2023   Procedure: COLONOSCOPY;  Surgeon: Albertina Hugger, MD;  Location: [facility];  Service: Gastroenterology;  Laterality: N/A;   COLONOSCOPY WITH ESOPHAGOGASTRODUODENOSCOPY (EGD)     CYSTOSCOPY/URETEROSCOPY/HOLMIUM LASER/STENT PLACEMENT Left 01/27/2023   Procedure: CYSTOSCOPY LEFT RETROGRADE PYELOGRAM, LEFT URETEROSCOPY, HOLMIUM LASER LITHOTRIPSY, AND LEFT URETERAL STENT PLACEMENT;  Surgeon: Homero Luster, MD;  Location: [facility];  Service: Urology;  Laterality: Left;  60 MINUTES   ESOPHAGOGASTRODUODENOSCOPY (EGD) WITH PROPOFOL  N/A 04/16/2021   Procedure: ESOPHAGOGASTRODUODENOSCOPY (EGD) WITH PROPOFOL ;  Surgeon: Sergio Dandy, MD;  Location: [facility];  Service: Endoscopy;  Laterality: N/A;   KNEE ARTHROSCOPY  1990's   left   NASAL SEPTOPLASTY W/ TURBINOPLASTY Bilateral 05/23/2016   Procedure: NASAL SEPTOPLASTY WITH TURBINATE REDUCTION;  Surgeon: Ammon Bales, MD;  Location: [facility] OR;  Service: ENT;  Laterality: Bilateral;   SHOULDER OPEN ROTATOR CUFF REPAIR  ~ 2010   left   SINOSCOPY     SINUS ENDO WITH FUSION  Right 05/23/2016   Procedure: LIMITED RIGHT ENDOSCOPIC SINUS SURGERY;  Surgeon: Ammon Bales, MD;  Location: [facility];  Service: ENT;  Laterality: Right;   TOTAL KNEE ARTHROPLASTY  08/23/2012   right   TOTAL KNEE ARTHROPLASTY  08/23/2012   Procedure: TOTAL KNEE ARTHROPLASTY;  Surgeon: Genevie Kerns, MD;  Location: [facility] OR;  Service: Orthopedics;  Laterality: Right;  RIGHT KNEE ARTHROPLASTY MEDIAL AND LATERAL COMPARTMENTS WITH PATELLA RESURFACING   TUBAL LIGATION  2001   UPPER GASTROINTESTINAL ENDOSCOPY     Family History  Problem Relation Age of Onset   Diabetes Mother    Lung cancer Mother        was a smoker   Stroke Mother    Alcohol abuse Father    Lung cancer Father        was a smoker   Emphysema Father        was a smoker   Heart disease Sister    Other Sister        blood clotting disorder   Diabetes Brother    Seizures Brother    Diabetes Maternal Aunt    Breast cancer Maternal Aunt    Esophageal cancer Maternal Uncle    Diabetes Maternal Uncle    Heart disease Maternal Grandfather    Diabetes Maternal Grandfather    Bipolar disorder Son    Muscular dystrophy Son    Heart disease Son    Heart disease Other        uncle   Colon cancer Neg Hx    Pancreatic cancer Neg Hx    Stomach cancer Neg Hx    Colon polyps Neg Hx    Rectal cancer Neg Hx    Social History   Socioeconomic History   Marital status: Married    Spouse name: Erven Heater   Number of children: 2   Years of education: 12   Highest education level: Not on file  Occupational History   Occupation: disabled  Tobacco Use   Smoking  status: Every Day    Current packs/day: 0.25    Average packs/day: 0.5 packs/day for 46.8 years (23.1 ttl pk-yrs)    Types: Cigarettes    Start date: 08/12/1975    Last attempt to quit: 12/30/2020   Smokeless tobacco: Never   Tobacco comments:    Smoking a few cigarettes per day.  Trying to quit.  01/13/2023  Vaping Use   Vaping status: Never Used  Substance and Sexual  Activity   Alcohol use: No    Alcohol/week: 0.0 standard drinks of alcohol   Drug use: No   Sexual activity: Never  Other Topics Concern   Not on file  Social History Narrative   Patient lives at home with husband Erven Heater, her son and his wife.    Patient has 2 children.    Patient has a high school education.    Patient is currently unemployed.    Patient is right handed.    Patient drinks 2 cups of caffeine per day.   Social Drivers of Corporate investment banker Strain: Low Risk  (05/06/2023)   Received from [facility]   Overall Financial Resource Strain (CARDIA)    Difficulty of Paying Living Expenses: Not hard at all  Food Insecurity: No Food Insecurity (09/16/2023)   Received from [facility]   Hunger Vital Sign    Within the past 12 months, you worried that your food would run out before you got the money to buy more.: Never true    Within the past 12 months, the food you bought just didn't last and you didn't have money to get more.: Never true  Transportation Needs: No Transportation Needs (09/16/2023)   Received from [facility] - Transportation    Lack of Transportation (Medical): No    Lack of Transportation (Non-Medical): No  Physical Activity: Inactive (05/06/2023)   Received from [facility]   Exercise Vital Sign    On average, how many days per week do you engage in moderate to strenuous exercise (like a brisk walk)?: 0 days    On average, how many minutes do you engage in exercise at this level?: 0 min  Stress: No Stress Concern Present (09/16/2023)   Received from [facility] - Occupational Stress Questionnaire    Feeling of Stress : Not at all  Social Connections: Socially Integrated (05/06/2023)   Received from [facility]   Social Connection and Isolation Panel    In a typical week, how many times do you talk on the phone with family, friends, or neighbors?: More than three times a week     How often do you get together with friends or relatives?: Three times a week    How often do you attend church or religious services?: 1 to 4 times per year    Do you belong to any clubs or organizations such as church groups, unions, fraternal or athletic groups, or school groups?: No    How often do you attend meetings of the clubs or organizations you belong to?: 1 to 4 times per year    Are you married, widowed, divorced, separated, never married, or living with a partner?: Married  Intimate Partner Violence: Not At Risk (05/06/2023)   Received from [facility]   Humiliation, Afraid, Rape, and Kick questionnaire    Within the last year, have you been afraid of your partner or ex-partner?: No  Within the last year, have you been humiliated or emotionally abused in other ways by your partner or ex-partner?: No    Within the last year, have you been kicked, hit, slapped, or otherwise physically hurt by your partner or ex-partner?: No    Within the last year, have you been raped or forced to have any kind of sexual activity by your partner or ex-partner?: No    SUBJECTIVE  Review of Systems Constitutional: Patient denies any unintentional weight loss or change in strength lntegumentary: Patient denies any rashes or pruritus Cardiovascular: Patient denies chest pain or syncope Respiratory: Patient denies shortness of breath Gastrointestinal: Patient denies nausea, vomiting, constipation, or diarrhea  Musculoskeletal: Patient denies muscle cramps or weakness Neurologic: Patient denies convulsions or seizures Allergic/Immunologic: Patient denies recent allergic reaction(s) Hematologic/Lymphatic: Patient denies bleeding tendencies Endocrine: Patient denies heat/cold intolerance  GU: As per HPI.  OBJECTIVE Vitals:   01/27/24 1113  BP: 104/69  Pulse: 80   There is no height or weight on file to calculate BMI.  Physical Examination Constitutional: No obvious distress; patient  is non-toxic appearing  Cardiovascular: No visible lower extremity edema.  Respiratory: The patient does not have audible wheezing/stridor; respirations do not appear labored  Gastrointestinal: Abdomen non-distended Musculoskeletal: Normal ROM of UEs  Skin: No obvious rashes/open sores  Neurologic: CN 2-12 grossly intact Psychiatric: Answered questions appropriately with normal affect  Hematologic/Lymphatic/Immunologic: No obvious bruises or sites of spontaneous bleeding  Urine microscopy: 6-10 WBC/hpf, otherwise unremarkable   ASSESSMENT Recurrent UTI - Plan: Urinalysis, Routine w reflex microscopic  Kidney stones - Plan: Urinalysis, Routine w reflex microscopic, DG Abd 1 View  We reviewed recent imaging results; awaiting radiology results, appears to have no acute findings per provider interpretation.  For stone prevention: Advised adequate hydration and we discussed option to consider low oxalate diet given that calcium  oxalate is the most common type of stone. Handout provided about stone prevention diet.  Will plan to follow up in 6 months with KUB for stone surveillance or sooner if needed. Patient verbalized understanding of and agreement with current plan. All questions were answered.  PLAN Advised the following: Maintain adequate fluid intake daily. Return in about 6 months (around 07/28/2024) for Stone, with UA, will need KUB prior.  Orders Placed This Encounter  Procedures   DG Abd 1 View    Standing Status:   Future    Expected Date:   07/28/2024    Expiration Date:   01/26/2025    Reason for Exam (SYMPTOM  OR DIAGNOSIS REQUIRED):   kidney stone    Preferred imaging location?:   [facility]   Urinalysis, Routine w reflex microscopic    It has been explained that the patient is to follow regularly with their PCP in addition to all other providers involved in their care and to follow instructions provided by these respective offices. Patient advised to contact  urology clinic if any urologic-pertaining questions, concerns, new symptoms or problems arise in the interim period.  There are no Patient Instructions on file for this visit.  Electronically signed by:  Lauretta Ponto, MSN, FNP-C, CUNP 01/27/2024 11:36 AM

## 2024-01-27 ENCOUNTER — Ambulatory Visit: Admitting: Urology

## 2024-01-27 ENCOUNTER — Ambulatory Visit (HOSPITAL_COMMUNITY)
Admission: RE | Admit: 2024-01-27 | Discharge: 2024-01-27 | Disposition: A | Discharge status: Home / Self Care | Source: Ambulatory Visit | Attending: Internal Medicine | Admitting: Internal Medicine

## 2024-01-27 ENCOUNTER — Ambulatory Visit (HOSPITAL_COMMUNITY)
Admission: RE | Admit: 2024-01-27 | Discharge: 2024-01-27 | Disposition: A | Discharge status: Home / Self Care | Source: Ambulatory Visit | Attending: Urology | Admitting: Urology

## 2024-01-27 ENCOUNTER — Encounter: Payer: Self-pay | Admitting: Urology

## 2024-01-27 VITALS — BP 104/69 | HR 80

## 2024-01-27 DIAGNOSIS — J449 Chronic obstructive pulmonary disease, unspecified: Secondary | ICD-10-CM

## 2024-01-27 DIAGNOSIS — N390 Urinary tract infection, site not specified: Secondary | ICD-10-CM

## 2024-01-27 DIAGNOSIS — Z87440 Personal history of urinary (tract) infections: Secondary | ICD-10-CM | POA: Diagnosis not present

## 2024-01-27 DIAGNOSIS — N200 Calculus of kidney: Secondary | ICD-10-CM | POA: Diagnosis present

## 2024-01-27 DIAGNOSIS — N189 Chronic kidney disease, unspecified: Secondary | ICD-10-CM

## 2024-01-27 HISTORY — DX: Chronic kidney disease, unspecified: N18.9

## 2024-01-27 LAB — URINALYSIS, ROUTINE W REFLEX MICROSCOPIC
Bilirubin, UA: NEGATIVE
Glucose, UA: NEGATIVE
Ketones, UA: NEGATIVE
Nitrite, UA: NEGATIVE
RBC, UA: NEGATIVE
Specific Gravity, UA: 1.025 (ref 1.005–1.030)
Urobilinogen, Ur: 0.2 mg/dL (ref 0.2–1.0)
pH, UA: 6 (ref 5.0–7.5)

## 2024-01-27 LAB — MICROSCOPIC EXAMINATION
Bacteria, UA: NONE SEEN
Epithelial Cells (non renal): NONE SEEN /HPF (ref 0–10)

## 2024-01-28 ENCOUNTER — Telehealth: Payer: Self-pay | Admitting: Internal Medicine

## 2024-01-28 DIAGNOSIS — J449 Chronic obstructive pulmonary disease, unspecified: Secondary | ICD-10-CM

## 2024-01-28 DIAGNOSIS — R911 Solitary pulmonary nodule: Secondary | ICD-10-CM | POA: Insufficient documentation

## 2024-01-28 NOTE — Assessment & Plan Note (Signed)
 See cxr  01/27/24   LUL peripherally not seen on lateral  - CT s contrast rec 01/28/2024 >>>   Last ldsct 09/18/22 s nodules but was overdu for annual f/u > proceed with standard CT s contrast ASAP.

## 2024-01-28 NOTE — Telephone Encounter (Signed)
 Cxr from 6/18 /25 shows vague density LUL not present on prior ct's or cxr so needs standard Chest without contrast nex available so we can clear her for ortho surgery

## 2024-02-01 ENCOUNTER — Other Ambulatory Visit: Payer: Self-pay | Admitting: Neurology

## 2024-02-01 ENCOUNTER — Telehealth: Payer: Self-pay | Admitting: Acute Care

## 2024-02-01 DIAGNOSIS — F17210 Nicotine dependence, cigarettes, uncomplicated: Secondary | ICD-10-CM

## 2024-02-01 DIAGNOSIS — Z122 Encounter for screening for malignant neoplasm of respiratory organs: Secondary | ICD-10-CM

## 2024-02-01 DIAGNOSIS — Z87891 Personal history of nicotine dependence: Secondary | ICD-10-CM

## 2024-02-01 NOTE — Telephone Encounter (Signed)
 Lung Cancer Screening Narrative/Criteria Questionnaire (Cigarette Smokers Only- No Cigars/Pipes/vapes)   [name]   SDMV:02/11/2024 12:00pm [name]         1956/01/01   LDCT: 02/16/2024 at 1:30pm AP    68 y.o.   Phone: [phone]  Lung Screening Narrative (confirm age 50-77 yrs Medicare / 50-80 yrs Private pay insurance)   Insurance information:[employer] mcr and mcd   Referring Provider:Dr Darlean   This screening involves an initial phone call with a team member from our program. It is called a shared decision making visit. The initial meeting is required by  insurance and Medicare to make sure you understand the program. This appointment takes about 15-20 minutes to complete. You will complete the screening scan at your scheduled date/time.  This scan takes about 5-10 minutes to complete. You can eat and drink normally before and after the scan.  Criteria questions for Lung Cancer Screening:   Are you a current or former smoker? Current Age began smoking: 68yo   If you are a former smoker, what year did you quit smoking? N/A(within 15 yrs)   To calculate your smoking history, I need an accurate estimate of how many packs of cigarettes you smoked per day and for how many years. (Not just the number of PPD you are now smoking)   Years smoking 48 x Packs per day 2.5 = Pack years 120   (at least 20 pack yrs)   (Make sure they understand that we need to know how much they have smoked in the past, not just the number of PPD they are smoking now)  Do you have a personal history of cancer?  No    Do you have a family history of cancer? Yes  (cancer type and and relative) Mother and father - lung  Are you coughing up blood?  No  Have you had unexplained weight loss of 15 lbs or more in the last 6 months? No  It looks like you meet all criteria.  When would be a good time for us  to schedule you for this screening?   Additional information: N/A

## 2024-02-09 NOTE — Telephone Encounter (Signed)
 Spoke with pt regarding her cxr , confirms her understanding and wants to order another test, told her I would do that and NFN.  Order CT CHest

## 2024-02-10 ENCOUNTER — Ambulatory Visit (HOSPITAL_COMMUNITY)
Admission: RE | Admit: 2024-02-10 | Discharge: 2024-02-10 | Disposition: A | Discharge status: Home / Self Care | Source: Ambulatory Visit | Attending: Internal Medicine | Admitting: Internal Medicine

## 2024-02-10 ENCOUNTER — Ambulatory Visit: Payer: Self-pay | Admitting: Internal Medicine

## 2024-02-10 DIAGNOSIS — J449 Chronic obstructive pulmonary disease, unspecified: Secondary | ICD-10-CM | POA: Diagnosis present

## 2024-02-11 ENCOUNTER — Encounter

## 2024-02-11 NOTE — Telephone Encounter (Signed)
 Copied from CRM [id number]. Topic: Clinical - Lab/Test Results >> Feb 10, 2024  3:54 PM Corean SAUNDERS wrote: Reason for CRM: Patient is requesting a call from Dr. Darlean or nurse when the provider gets the results from her CT as she states she is very nervous.   Done- See results followup note from 02/10/24- Dr Darlean has called and discussed the results with the pt.

## 2024-02-13 ENCOUNTER — Other Ambulatory Visit: Payer: Self-pay | Admitting: Neurology

## 2024-02-16 ENCOUNTER — Other Ambulatory Visit: Payer: Self-pay

## 2024-02-16 ENCOUNTER — Ambulatory Visit (HOSPITAL_COMMUNITY)

## 2024-02-16 ENCOUNTER — Ambulatory Visit: Payer: Self-pay | Admitting: Internal Medicine

## 2024-02-16 DIAGNOSIS — R0602 Shortness of breath: Secondary | ICD-10-CM

## 2024-02-16 DIAGNOSIS — R062 Wheezing: Secondary | ICD-10-CM

## 2024-02-16 MED ORDER — PREDNISONE 10 MG PO TABS: 10.0000 mg | ORAL_TABLET | Freq: Every day | ORAL | 0 refills | Status: DC

## 2024-02-16 NOTE — Addendum Note (Signed)
 Addended by: KARNA CONSUELO PARAS on: 02/16/2024 12:26 PM   Modules accepted: Orders

## 2024-02-16 NOTE — Addendum Note (Signed)
 Addended by: KARNA CONSUELO PARAS on: 02/16/2024 01:03 PM   Modules accepted: Orders

## 2024-02-16 NOTE — Addendum Note (Signed)
 Addended by: KARNA CONSUELO PARAS on: 02/16/2024 12:09 PM   Modules accepted: Orders

## 2024-02-16 NOTE — Telephone Encounter (Signed)
 Spoke with patient regarding prior message. Advised patient per Dr.Wert sent Prednisone  10 mg take 4 each am x 2 days, 2 each am x 2 days, 1 each am x 2 days and stop  to patient's pharmacy. Patient's voice was understanding.Nothing else further needed

## 2024-02-16 NOTE — Telephone Encounter (Signed)
 FYI Only or Action Required?: Action required by provider: refusing disposition, requesting prednisone .  Patient is followed in Pulmonology for COPD and OSA, last seen on 01/25/2024 by Veronica Ozell NOVAK, MD.  Called Nurse Triage reporting Shortness of Breath and Wheezing.  Symptoms began several days ago.  Interventions attempted: Rescue inhaler, Maintenance inhaler, Nebulizer treatments, and Home oxygen  use.  Symptoms are: gradually worsening.  Triage Disposition: See HCP Within 4 Hours (Or PCP Triage)  Patient/caregiver understands and will follow disposition?: No, refuses disposition       Copied from CRM [id number]. Topic: Clinical - Red Word Triage >> Feb 16, 2024  9:53 AM [name] wrote: Red Word that prompted transfer to Nurse Triage: SOB./ wheezing.  Wants prenisone Reason for Disposition  [1] Longstanding difficulty breathing (e.g., CHF, COPD, emphysema) AND [2] WORSE than normal  Answer Assessment - Initial Assessment Questions E2C2 Pulmonary Triage - Initial Assessment Questions Chief Complaint (e.g., cough, sob, wheezing, fever, chills, sweat or additional symptoms) *Go to specific symptom protocol after initial questions. More than usual SOB, mild more but don't want it any worse Wheezing some of the time Mostly up and doing stuff Go for a biopsy in the morning, don't want to be SOB for that Every once in a while coughing fit but not that much, not coughing anything up, no chest congestion No chest pain or dizziness, no fever or weakness  How long have symptoms been present? Couple days  Have you used your inhalers/maintenance medication? Yes If yes, What medications? Trelegy Albuterol  rescue inhaler and nebulizer  If inhaler, ask How many puffs and how often? Note: Review instructions on medication in the chart. 2x/day not used today, used nebulizer today will probably use again this afternoon  OXYGEN : Do you wear supplemental oxygen ? Yes If yes,  How many liters are you supposed to use? 3L  Do you monitor your oxygen  levels? Yes If yes, What is your reading (oxygen  level) today? 95%  What is your usual oxygen  saturation reading?  (Note: Pulmonary O2 sats should be 90% or greater) 94-95%  6. CARDIAC HISTORY: Do you have any history of heart disease? (e.g., heart attack, angina, bypass surgery, angioplasty)      significant  7. LUNG HISTORY: Do you have any history of lung disease?  (e.g., pulmonary embolus, asthma, emphysema)     Significant   Advised appt in next 4 hours, pt refusing would rather just have a prescription for prednisone , sending message to pulm for call back with further recommendations  Protocols used: Breathing Difficulty-A-AH

## 2024-02-17 ENCOUNTER — Ambulatory Visit: Admitting: Acute Care

## 2024-02-17 ENCOUNTER — Encounter: Payer: Self-pay | Admitting: Acute Care

## 2024-02-17 VITALS — BP 113/71 | HR 75 | Temp 97.6°F | Ht 60.0 in | Wt 192.0 lb

## 2024-02-17 DIAGNOSIS — J449 Chronic obstructive pulmonary disease, unspecified: Secondary | ICD-10-CM

## 2024-02-17 DIAGNOSIS — R911 Solitary pulmonary nodule: Secondary | ICD-10-CM

## 2024-02-17 DIAGNOSIS — F17210 Nicotine dependence, cigarettes, uncomplicated: Secondary | ICD-10-CM

## 2024-02-17 DIAGNOSIS — R5381 Other malaise: Secondary | ICD-10-CM

## 2024-02-17 DIAGNOSIS — J9611 Chronic respiratory failure with hypoxia: Secondary | ICD-10-CM

## 2024-02-17 DIAGNOSIS — F17200 Nicotine dependence, unspecified, uncomplicated: Secondary | ICD-10-CM

## 2024-02-17 NOTE — Patient Instructions (Addendum)
 It is good to see you today.  We have looked at your Ct chest.  Plan will be for a PET scan asap, and then follow up with me after to review the results. If the PET scan is concerning, we will most likely schedule a biopsy at follow up.  Please work on quitting smoking. This is the single most powerful action you can take to decrease your risk of lung cancer.  You can receive free nicotine  replacement therapy (patches, gum, or mints) by calling 1-800-QUIT NOW. Please call so we can get you on the path to becoming a non-smoker. I know it is hard, but you can do this!  Hypnosis for smoking cessation  [employer] [phone]  Acupuncture for smoking cessation  [employer] [phone]   Do not smoke while wearing your oxygen . Wear oxygen  at 3 L [place] as you have been doing. Saturation goal is greater than 88-90% Continue Trelegy as you have been doing. Rinse mouth after use. Use Albuterol  and Duonebs as needed for shortness of breath or wheezing.

## 2024-02-17 NOTE — Progress Notes (Signed)
 History of Present Illness [name] is a 68 y.o. female current every day smoker ( 23.1 pack year history) referred for  by Dr. Darlean for abnormal chest imaging. She will be followed by Dr. Shelah.  02/17/2024 Pt. Presents for consultation for abnormal finding on CT Chest. She was having a work up for orthopedic surgery, and CXR 01/27/2024 showed  notation of a vague density LUL not present on prior ct's or cxr . Dr. Darlean ordered a CT Chest, and after reviewing the results, referred her to be seen for the pulmonary lung nodule.  The nodule is 1.6 x 1.9 cm in the left upper lobe and is spiculated.  She is a current smoker. She has weight loss of 30 pounds in 6 months , but has gained it all back.  She denies any blood in her sputum.  She has a family history of lung cancer in her both parents, 2 uncles, and 1 great uncle. She has an aunt who had breast cancer.   She lives with her husband. No recent travel. She does take an asa per day.  Pt. Is on 3 L oxygen  at all times. She does continue to smoke. I have warned her about smoking while wearing her oxygen .She has voice hoarseness. She states this developed about 1 month ago. She denies a chronic cough.   She denies any work related exposures. She worked in [employer], but is currently retired.  She is compliant with her Trelegy. She uses her albuterol  rescue an average of twice daily.She uses her Duo Nebs as needed. She has been using these for the last 2 days as she has had increased shortness of breath. She states her secretions are green. Dr. Darlean sent in a prednisone  taper yesterday.  Test Results: 02/10/2024 CT Chest with Contrast Lungs/Pleura: Centrilobular and paraseptal emphysema. Spiculated left upper lobe nodule measures 1.6 x 1.9 cm, with extensions to the adjacent visceral pleura. No pleural fluid. Airway is unremarkable. 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 Aortic atherosclerosis  (ICD10-I70.0). Coronary artery calcification. Emphysema (ICD10-J43.9).     Latest Ref Rng & Units 02/06/2023    8:09 AM 02/05/2023    5:18 AM 02/04/2023    7:50 PM  CBC  WBC 4.0 - 10.5 K/uL 9.7  18.9  25.7   Hemoglobin 12.0 - 15.0 g/dL 9.4  9.2  89.7   Hematocrit 36.0 - 46.0 % 30.7  29.6  32.8   Platelets 150 - 400 K/uL 250  225  251        Latest Ref Rng & Units 02/06/2023    8:09 AM 02/05/2023    5:18 AM 02/04/2023    7:50 PM  BMP  Glucose 70 - 99 mg/dL 95  94  885   BUN 8 - 23 mg/dL 21  19  23    Creatinine 0.44 - 1.00 mg/dL 8.88  9.15  8.97   Sodium 135 - 145 mmol/L 136  136  136   Potassium 3.5 - 5.1 mmol/L 4.0  3.8  3.3   Chloride 98 - 111 mmol/L 107  109  107   CO2 22 - 32 mmol/L 22  19  21    Calcium  8.9 - 10.3 mg/dL 8.6  8.4  8.5     BNP    Component Value Date/Time   BNP 14.4 01/30/2012 1700    ProBNP    Component Value Date/Time   PROBNP 16.0 11/18/2021 0938    PFT  Component Value Date/Time   FEV1PRE 0.99 09/27/2021 1009   FEV1POST 1.05 09/27/2021 1009   FVCPRE 1.71 09/27/2021 1009   FVCPOST 1.82 09/27/2021 1009   DLCOUNC 9.41 09/27/2021 1009   PREFEV1FVCRT 58 09/27/2021 1009   PSTFEV1FVCRT 58 09/27/2021 1009    CT Chest Wo Contrast Result Date: 02/10/2024 CLINICAL DATA:  Abnormal chest radiograph with left upper lobe nodule. * Tracking Code: BO * EXAM: CT CHEST WITHOUT CONTRAST TECHNIQUE: Multidetector CT imaging of the chest was performed following the standard protocol without IV contrast. RADIATION DOSE REDUCTION: This exam was performed according to the departmental dose-optimization program which includes automated exposure control, adjustment of the mA and/or kV according to patient size and/or use of iterative reconstruction technique. COMPARISON:  Chest radiograph 01/27/2024 and CT chest 08/05/2022. FINDINGS: Cardiovascular: Atherosclerotic calcification of the aorta, aortic valve and coronary arteries. Heart is at the upper limits of normal in  size. No pericardial effusion. Mediastinum/Nodes: No pathologically enlarged mediastinal or axillary lymph nodes. Hilar regions are difficult to definitively evaluate without IV contrast. Esophagus is grossly unremarkable. Lungs/Pleura: Centrilobular and paraseptal emphysema. Spiculated left upper lobe nodule measures 1.6 x 1.9 cm, with extensions to the adjacent visceral pleura. No pleural fluid. Airway is unremarkable. Upper Abdomen: Visualized portions of the liver, gallbladder, adrenal glands, kidneys, spleen, pancreas, stomach and bowel are grossly unremarkable. No upper abdominal adenopathy. Musculoskeletal: Degenerative changes in the spine. No worrisome lytic or sclerotic lesions. IMPRESSION: 1. 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 2. Aortic atherosclerosis (ICD10-I70.0). Coronary artery calcification. 3.  Emphysema (ICD10-J43.9). Electronically Signed   By: Newell Eke M.D.   On: 02/10/2024 12:01   DG Abd 1 View Result Date: 02/04/2024 CLINICAL DATA:  History of kidney stones EXAM: ABDOMEN - 1 VIEW COMPARISON:  11/22/2007 FINDINGS: Scattered large and small bowel gas is noted. Retained fecal material is noted consistent with a mild degree of constipation. There is a rounded calcification noted in the lower left hemiabdomen measuring up to 4 mm consistent with nonobstructing stone. No bony abnormality is seen. No free air is noted. IMPRESSION: 4 mm left renal stone. Changes consistent with colonic constipation. Electronically Signed   By: Oneil Devonshire M.D.   On: 02/04/2024 03:58   DG Chest 2 View Result Date: 01/28/2024 CLINICAL DATA:  Preop chest radiograph for knee replacement. History of COPD. EXAM: CHEST - 2 VIEW COMPARISON:  06/24/2023.  CT, 08/05/2022. FINDINGS: Cardiac silhouette is normal in size and configuration. No mediastinal or hilar masses. No evidence of adenopathy. Ill-defined nodular opacity measuring approximately 2 cm projects  in the left upper lobe, seen on the AP view only. Lungs demonstrate bilateral interstitial thickening similar to the prior exam, but are otherwise clear. No pleural effusion or pneumothorax. Skeletal structures are intact. IMPRESSION: 1. Ill-defined left upper lobe nodular opacity. Recommend follow-up chest CT without contrast for further assessment. No other change from the prior chest radiograph. Electronically Signed   By: Alm Parkins M.D.   On: 01/28/2024 09:55   EEG adult Result Date: 01/19/2024 Gregg Lek, MD     01/19/2024  2:35 PM History: 68 year old woman with seizure EEG classification: Awake and drowsy Duration: 25 minutes Technical aspects: This EEG study was done with scalp electrodes positioned according to the 10-20 International system of electrode placement. Electrical activity was reviewed with band pass filter of 1-70Hz , sensitivity of 7 uV/mm, display speed of 28mm/sec with a 60Hz  notched filter applied as appropriate. EEG data were  recorded continuously and digitally stored. Description of the recording: The background rhythms of this recording consists of a fairly well modulated medium amplitude alpha rhythm of 8.5 Hz that is reactive to eye opening and closure. Present in the anterior head region is a 15-20 Hz beta activity. Photic stimulation was performed, did not show any abnormalities. Hyperventilation was not performed. Drowsiness was manifested by background fragmentation. No abnormal epileptiform discharges seen during this recording. There was right parieto-occipital focal slowing. There were no electrographic seizure identified. Abnormality: Intermittent Right parieto-occipital focal slowing  Impression: This is an abnormal awake and drowsy EEG due to presence of right parieto-occipital intermittent slowing. 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 Pastor Falling, MD [facility]     Past medical  hx Past Medical History:  Diagnosis Date   Allergic rhinitis    Anxiety    Arthritis    hands and legs and feet (04/27/2012)   Asthma    Cancer (HCC) 2005   rt arm mole rem cancer   CHF (congestive heart failure) (HCC)    Chronic bronchitis (HCC)    keep it all the time (04/27/2012)   Chronic kidney disease    Chronic kidney disease (CKD)    Stage III   COPD (chronic obstructive pulmonary disease) (HCC)    COVID-19 virus infection    03-2021   Depression    PT DENIES   Diverticulosis    DJD (degenerative joint disease)    Emphysema    Epilepsy (HCC)    Epilepsy (HCC)    Esophageal stricture    Exertional dyspnea    GERD (gastroesophageal reflux disease)    Glaucoma    H/O blood clots    Right leg   Heart failure (HCC)    History of COVID-19 03/2021   History of kidney stones    History of nuclear stress test    Myoview  12/25/22: EF 74, no ischemia or infarction, low risk   Hyperlipidemia    Hypertension    IBS (irritable bowel syndrome)    Migraine    Neck pain    Cervical disc   OSA on CPAP    6-7 yrs; pt wears CPAP but has not been able to use it lately due to sinus issues   Oxygen  deficiency    USES 3 LITERS 02 CONTINOUS   Pneumonia 2012; 04/27/2012   Recurrent upper respiratory infection (URI)    Right leg DVT (HCC) 1982   groin   Seizures (HCC) 06/2023   Sleep apnea    Type II diabetes mellitus (HCC)    No meds since weight loss   UTI (urinary tract infection)    2 weeks ago   Vertigo 10/24/2014     Social History   Tobacco Use   Smoking status: Every Day    Current packs/day: 0.25    Average packs/day: 0.5 packs/day for 46.9 years (23.1 ttl pk-yrs)    Types: Cigarettes    Start date: 08/12/1975    Last attempt to quit: 12/30/2020   Smokeless tobacco: Never   Tobacco comments:    1/2 pack a day 02/17/2024 KRD  Vaping Use   Vaping status: Never Used  Substance Use Topics   Alcohol use: No    Alcohol/week: 0.0 standard drinks of alcohol   Drug  use: No    [name] reports that she has been smoking cigarettes. She started smoking about 48 years ago. She has a 23.1 pack-year  smoking history. She has never used smokeless tobacco. She reports that she does not drink alcohol and does not use drugs.  Tobacco Cessation: Ready to quit: Not Answered Counseling given: Not Answered Tobacco comments: 1/2 pack a day 02/17/2024 KRD Current everyday smoker with a 23.1-pack-year smoking history. Patient has been counseled for 3 to 4 minutes today on smoking cessation Patient has also been counseled on not smoking while wearing her oxygen  She verbalized understanding of the above Past surgical hx, Family hx, Social hx all reviewed.  Current Outpatient Medications on File Prior to Visit  Medication Sig   albuterol  (PROAIR  HFA) 108 (90 Base) MCG/ACT inhaler Inhale 2 puffs into the lungs every 6 (six) hours as needed for shortness of breath or wheezing.   albuterol  (PROVENTIL ) (2.5 MG/3ML) 0.083% nebulizer solution USE 1 VIAL IN NEBULIZER EVERY 6 HOURS AS NEEDED FOR SHORTNESS OF BREATH (Patient taking differently: Take 2.5 mg by nebulization every 6 (six) hours as needed for wheezing or shortness of breath.)   ALPRAZolam  (XANAX ) 0.25 MG tablet Take 0.25 mg by mouth 2 (two) times daily.   Cholecalciferol  (VITAMIN D3) 50 MCG (2000 UT) capsule TAKE ONE CAPSULE BY MOUTH DAILY (Patient taking differently: Take 2,000 Units by mouth daily.)   famotidine  (PEPCID ) 40 MG tablet Take 40 mg by mouth at bedtime.   ferrous sulfate  325 (65 FE) MG tablet Take 325 mg by mouth daily with breakfast.   fluticasone  (FLONASE ) 50 MCG/ACT nasal spray Place 1 spray into both nostrils daily.   gabapentin  (NEURONTIN ) 100 MG capsule Take 1 capsule (100 mg total) by mouth at bedtime. (Patient taking differently: Take 200 mg by mouth 3 (three) times daily.)   guaiFENesin  (MUCINEX ) 600 MG 12 hr tablet Take 2 tablets (1,200 mg total) by mouth 2 (two) times daily as needed. (Patient  taking differently: Take 1,200 mg by mouth 2 (two) times daily as needed for cough or to loosen phlegm.)   ipratropium-albuterol  (DUONEB) 0.5-2.5 (3) MG/3ML SOLN Take 3 mLs by nebulization every 6 (six) hours as needed (Wheezing/sob).   levETIRAcetam  (KEPPRA ) 250 MG tablet Take 250 mg by mouth 2 (two) times daily.   loratadine  (ALLERGY RELIEF) 10 MG tablet Take 1 tablet (10 mg total) by mouth daily.   methocarbamol (ROBAXIN) 750 MG tablet Take 750 mg by mouth 3 (three) times daily.   metoCLOPramide  (REGLAN ) 5 MG tablet Take 1 tablet (5 mg total) by mouth every 12 (twelve) hours as needed for up to 2 doses for nausea. Take 30-45 minutes before evening and AM doses of bowel preparation solution.   montelukast  (SINGULAIR ) 10 MG tablet TAKE ONE TABLET BY MOUTH AT BEDTIME   OXYGEN  Inhale 3 L into the lungs continuous.   pantoprazole  (PROTONIX ) 40 MG tablet Take 1 tablet (40 mg total) by mouth 2 (two) times daily. **PLEASE CALL OFFICE TO SCHEDULE FOLLOW UP   predniSONE  (DELTASONE ) 10 MG tablet Take 1 tablet (10 mg total) by mouth daily with breakfast. 10 mg take 4 each am x 2 days, 2 each am x 2 days, 1 each am x 2 days and stop   QUEtiapine  (SEROQUEL ) 100 MG tablet TAKE ONE TABLET BY MOUTH AT BEDTIME   simvastatin  (ZOCOR ) 40 MG tablet TAKE ONE TABLET BY MOUTH AT BEDTIME (Patient taking differently: Take 40 mg by mouth daily at 6 PM.)   topiramate  (TOPAMAX ) 100 MG tablet Take 2 tablets (200 mg total) by mouth 2 (two) times daily.   traMADol  (ULTRAM ) 50 MG tablet Take 1  tablet (50 mg total) by mouth every 6 (six) hours as needed.   TRELEGY ELLIPTA  200-62.5-25 MCG/ACT AEPB Inhale 1 puff into the lungs daily.   triamcinolone  cream (KENALOG ) 0.1 % Apply 1 Application topically daily as needed (irritation).   No current facility-administered medications on file prior to visit.     Allergies  Allergen Reactions   Amoxicillin Anaphylaxis    Breakout, mouth swells   Azithromycin Swelling and Rash     swelling all over, hives, thrush   Cefdinir      Throat swelling after 3rd dose   Chantix  [Varenicline ] Other (See Comments)    bad dreams, difficulty breathing   Clindamycin  Anaphylaxis   Doxycycline  Hyclate Anaphylaxis and Rash    swelling   Levofloxacin  Anaphylaxis   Paxlovid  [Nirmatrelvir -Ritonavir ] Swelling    Throat swelling   Penicillins Anaphylaxis, Shortness Of Breath and Rash    ALMOST DIED, ended up in hospital    Sulfonamide Derivatives Shortness Of Breath, Swelling and Rash    break out from head to toe   Toradol  [Ketorolac  Tromethamine ] Other (See Comments)    Seizure    Clarithromycin Hives    swelling   Nortriptyline Swelling, Rash and Other (See Comments)    Per patient had kidney problems on this medication, could not use bathroom (urinary retention)   Moxifloxacin     Codeine  Nausea And Vomiting   Fluticasone -Salmeterol Other (See Comments)    REACTION: ulcers in mouth   Triptans Rash    Mouth breaks out and start itching    Review Of Systems:  Constitutional:   + weight loss, No night sweats,  Fevers, chills, fatigue, or  lassitude.  HEENT:   No headaches,  Difficulty swallowing,  Tooth/dental problems, or  Sore throat,                No sneezing, itching, ear ache, nasal congestion, post nasal drip,   CV:  No chest pain,  Orthopnea, PND, swelling in lower extremities, anasarca, dizziness, palpitations, syncope.   GI  No heartburn, indigestion, abdominal pain, nausea, vomiting, diarrhea, change in bowel habits, loss of appetite, bloody stools.   Resp: + shortness of breath with exertion less at rest.  Positive baseline excess mucus, + baseline productive cough,  No non-productive cough,  No coughing up of blood.  No change in color of mucus.  No wheezing.  No chest wall deformity, currently wearing oxygen  at 3 L nasal cannula  Skin: no rash or lesions.  GU: no dysuria, change in color of urine, no urgency or frequency.  No flank pain, no  hematuria   MS:  + joint pain or swelling.  + decreased range of motion.  + back pain.  Currently in a wheelchair  Psych:  No change in mood or affect. No depression or anxiety.  No memory loss.   Vital Signs BP 113/71 (BP Location: Left Arm, Patient Position: Sitting, Cuff Size: Normal)   Pulse 75   Temp 97.6 F (36.4 C) (Oral)   Ht 5' (1.524 m)   Wt 192 lb (87.1 kg)   SpO2 100%   BMI 37.50 kg/m    Physical Exam:  General- No distress,  A&Ox3, pleasant and appropriate ENT: No sinus tenderness, TM clear, pale nasal mucosa, no oral exudate,no post nasal drip, no LAN Cardiac: S1, S2, regular rate and rhythm, no murmur Chest: No wheeze/ rales/ dullness; no accessory muscle use, no nasal flaring, no sternal retractions, diminished per bases, few rhonchi that clear with cough Abd.:  Soft Non-tender, nondistended, bowel sounds positive,Body mass index is 37.5 kg/m.  Ext: No clubbing cyanosis, edema, no obvious deformities Neuro: Physical deconditioning, muscle wasting, moving all extremities x 4, alert and oriented x 3, Skin: No rashes, warm and dry, no obvious skin lesions Psych: normal mood and behavior   Assessment/Plan 1.6 x 1.9 cm in the left upper lobe and is spiculated. Current everyday smoker Chronic respiratory failure with hypoxia, on home oxygen  Physical deconditioning Plan We have looked at your Ct chest.  Plan will be for a PET scan asap, and then follow up with me after to review the results. There may be a better option for biopsy that does not include general anesthesia. If the PET scan is concerning, we will most likely schedule a biopsy at follow up.  Please work on quitting smoking. This is the single most powerful action you can take to decrease your risk of lung cancer.  You can receive free nicotine  replacement therapy (patches, gum, or mints) by calling 1-800-QUIT NOW. Please call so we can get you on the path to becoming a non-smoker. I know it is hard,  but you can do this!  Hypnosis for smoking cessation  [employer] [phone]  Acupuncture for smoking cessation  [employer] [phone]   Do not smoke while wearing your oxygen . Wear oxygen  at 3 L [place] as you have been doing. Saturation goal is greater than 88-90% Continue Trelegy as you have been doing. Rinse mouth after use. Use Albuterol  and Duonebs as needed for shortness of breath or wheezing.  Call if you need us  sooner  I spent 30 minutes dedicated to the care of this patient on the date of this encounter to include pre-visit review of records, face-to-face time with the patient discussing conditions above, post visit ordering of testing, clinical documentation with the electronic health record, making appropriate referrals as documented, and communicating necessary information to the patient's healthcare team.     Lauraine JULIANNA Lites, NP 02/17/2024  9:01 AM

## 2024-02-25 ENCOUNTER — Ambulatory Visit (HOSPITAL_COMMUNITY)
Admission: RE | Admit: 2024-02-25 | Discharge: 2024-02-25 | Disposition: A | Discharge status: Home / Self Care | Source: Ambulatory Visit | Attending: Acute Care | Admitting: Acute Care

## 2024-02-25 DIAGNOSIS — R911 Solitary pulmonary nodule: Secondary | ICD-10-CM | POA: Insufficient documentation

## 2024-02-25 MED ORDER — FLUDEOXYGLUCOSE F - 18 (FDG) INJECTION
10.9000 | Freq: Once | INTRAVENOUS | Status: AC | PRN
  Administered 2024-02-25: 10.21 via INTRAVENOUS

## 2024-02-26 ENCOUNTER — Telehealth: Payer: Self-pay

## 2024-02-26 NOTE — Telephone Encounter (Signed)
 Copied from CRM [id number]. Topic: Clinical - Lab/Test Results >> Feb 26, 2024  8:40 AM [name] wrote: Reason for CRM: pt calling for results of PET scan.  Pt aware results are not back yet.  Please call when available.  Called and spoke with the patient and advised PET results have not been read and are taking 3-4 wks. Advised pt she would be contacted once reviewed. Nfn

## 2024-02-29 ENCOUNTER — Other Ambulatory Visit (HOSPITAL_COMMUNITY)

## 2024-02-29 NOTE — Telephone Encounter (Unsigned)
 Copied from CRM [id number]. Topic: Clinical - Lab/Test Results >> Feb 29, 2024  9:12 AM [name] wrote: Reason for CRM: Patient requesting PET scan interpretation and a call regarding her results, as she does not understand the report itself. Please contact patient.

## 2024-03-01 ENCOUNTER — Ambulatory Visit (INDEPENDENT_AMBULATORY_CARE_PROVIDER_SITE_OTHER): Admitting: Acute Care

## 2024-03-01 ENCOUNTER — Encounter: Payer: Self-pay | Admitting: Acute Care

## 2024-03-01 ENCOUNTER — Telehealth: Payer: Self-pay | Admitting: Acute Care

## 2024-03-01 ENCOUNTER — Encounter: Payer: Self-pay | Admitting: Emergency Medicine

## 2024-03-01 VITALS — BP 98/72 | HR 90 | Ht 60.0 in | Wt 193.0 lb

## 2024-03-01 DIAGNOSIS — J9611 Chronic respiratory failure with hypoxia: Secondary | ICD-10-CM

## 2024-03-01 DIAGNOSIS — R911 Solitary pulmonary nodule: Secondary | ICD-10-CM | POA: Diagnosis not present

## 2024-03-01 DIAGNOSIS — F17210 Nicotine dependence, cigarettes, uncomplicated: Secondary | ICD-10-CM | POA: Diagnosis not present

## 2024-03-01 DIAGNOSIS — Z9981 Dependence on supplemental oxygen: Secondary | ICD-10-CM

## 2024-03-01 DIAGNOSIS — R062 Wheezing: Secondary | ICD-10-CM

## 2024-03-01 DIAGNOSIS — F17200 Nicotine dependence, unspecified, uncomplicated: Secondary | ICD-10-CM

## 2024-03-01 MED ORDER — PREDNISONE 10 MG PO TABS: ORAL_TABLET | ORAL | 0 refills | Status: DC

## 2024-03-01 MED ORDER — PREDNISONE 10 MG PO TABS: 10.0000 mg | ORAL_TABLET | Freq: Every day | ORAL | 0 refills | Status: DC

## 2024-03-01 NOTE — Patient Instructions (Addendum)
 It is good to see you today  We have reviewed your PET scan. The nodule of concern in your left upper lobe was concerning on PET. We have decided to move froward with a biopsy to better evaluate this finding. I have placed an order for a bronchoscopy with biopsies.  We have discussed the procedure in detail.  We have reviewed the risks and benefits of the procedure. These include bleeding, infection, puncture of the lung, and adverse reaction to anesthesia. You have agreed to proceed with biopsy to evaluate the left upper lobe Your procedure will be done by Dr. Lamar Chris. You will receive a letter today with date time and information pertaining to the procedure. You will need someone to drive you to the procedure, stay with you during the procedure, and stay with you after the procedure. You will also need someone to stay with you for 24 hours after anesthesia to ensure you have cleared and are doing well. You will follow-up with me 1 week after the procedure to review the results and to ensure you are doing well. Call if you need us  prior to the procedure or if you have any questions at all. Please contact office for sooner follow up if symptoms do not improve or worsen or seek emergency care     Stop Aspirin  x 48 hours before procedure. I have called in a prednisone  taper for you wheezing. 10 mg take 4 each am x 2 days, 2 each am x 2 days, 1 each am x 2 days and stop  Call if you need us  sooner.  Please contact office for sooner follow up if symptoms do not improve or worsen or seek emergency care

## 2024-03-01 NOTE — H&P (View-Only) (Signed)
 History of Present Illness [name] is a 68 y.o. female current every day smoker ( 23.1 pack year history) referred for by Dr. Darlean for abnormal chest imaging. She will be followed by Dr. Shelah.   Past Medical History:  Diagnosis Date   Allergic rhinitis    Anxiety    Arthritis    hands and legs and feet (04/27/2012)   Asthma    Cancer (HCC) 2005   rt arm mole rem cancer   CHF (congestive heart failure) (HCC)    Chronic bronchitis (HCC)    keep it all the time (04/27/2012)   Chronic kidney disease    Chronic kidney disease (CKD)    Stage III   COPD (chronic obstructive pulmonary disease) (HCC)    COVID-19 virus infection    03-2021   Depression    PT DENIES   Diverticulosis    DJD (degenerative joint disease)    Emphysema    Epilepsy (HCC)    Epilepsy (HCC)    Esophageal stricture    Exertional dyspnea    GERD (gastroesophageal reflux disease)    Glaucoma    H/O blood clots    Right leg   Heart failure (HCC)    History of COVID-19 03/2021   History of kidney stones    History of nuclear stress test    Myoview  12/25/22: EF 74, no ischemia or infarction, low risk   Hyperlipidemia    Hypertension    IBS (irritable bowel syndrome)    Migraine    Neck pain    Cervical disc   OSA on CPAP    6-7 yrs; pt wears CPAP but has not been able to use it lately due to sinus issues   Oxygen  deficiency    USES 3 LITERS 02 CONTINOUS   Pneumonia 2012; 04/27/2012   Recurrent upper respiratory infection (URI)    Right leg DVT (HCC) 1982   groin   Seizures (HCC) 06/2023   Sleep apnea    Type II diabetes mellitus (HCC)    No meds since weight loss   UTI (urinary tract infection)    2 weeks ago   Vertigo 10/24/2014      03/01/2024 Pt. Presents for follow up PET scan, ordered as further work up of her abnormal CT chest.  She states she has been doing well.  No respiratory complaints.    [name] is a 68 year old female who presents for evaluation of a left upper lobe  lung nodule. She is accompanied by her grandchild, who is her primary support. She was referred by Dr. Darlean for further evaluation of a lung nodule.  Patient has a significant medical history as detailed above.  A left upper lobe lung nodule was initially identified on a CT scan and further evaluated with a PET scan, which showed significant abnormal uptake on PET. The nodule measures 1.6 by 1.9 centimeters, with a slight increase noted on the PET scan compared to the CT scan and a short interval.  SUV max is noted to be 10.3.  We discussed the best options moving forward.  I explained that I felt like the best option was to proceed with bronchoscopy with biopsies for definitive tissue diagnosis.  We reviewed the risks involved in the procedure.  Patient feels it is in her best interest to move forward with navigational bronchoscopy with biopsies.  We will schedule her for a biopsy for definitive tissue diagnosis.  [name] has a history  of heart failure, which is currently resolved, and well-managed.   She is not on any medication for heart failure at present. She is under the care of a cardiologist.  She reports a recent cold and is concerned about a potential COPD flare. She has a complex allergy profile, including allergies to doxycycline , amoxicillin, and Cipro , which complicates her treatment options. She has previously taken Cipro  with good results, but it is listed as an allergy significant for anaphylaxis in her records. I will send in a prednisone  taper for her wheezing. Patient is afebrile and does not have discolored secretions therefore I will not treat with antibiotics at this time.  She is currently taking baby aspirin  daily. She also has a history of sleep apnea but is unable to use a CPAP machine.  She experiences weakness, which she attributes to anxiety or other unknown causes.  She wears oxygen  at 3 L nasal cannula for chronic hypoxic respiratory failure.   Test Results: PET  02/25/2024  Left upper lobe spiculated mass on prior examination today appears slightly more cavitary. This has significant abnormal uptake worrisome for neoplasm.  SUV max of 10.3   No additional areas of abnormal uptake identified at this time.   The mild asymmetric uptake along the ascending colon could be a variant of bowel distribution. Please correlate with any history and symptoms.   Colonic diverticula. Nonobstructing left-sided renal stone. Emphysematous lung changes. Coronary artery calcifications.  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 2. Aortic atherosclerosis (ICD10-I70.0). Coronary artery calcification. 3.  Emphysema (ICD10-J43.9).      Latest Ref Rng & Units 02/06/2023    8:09 AM 02/05/2023    5:18 AM 02/04/2023    7:50 PM  CBC  WBC 4.0 - 10.5 K/uL 9.7  18.9  25.7   Hemoglobin 12.0 - 15.0 g/dL 9.4  9.2  89.7   Hematocrit 36.0 - 46.0 % 30.7  29.6  32.8   Platelets 150 - 400 K/uL 250  225  251        Latest Ref Rng & Units 02/06/2023    8:09 AM 02/05/2023    5:18 AM 02/04/2023    7:50 PM  BMP  Glucose 70 - 99 mg/dL 95  94  885   BUN 8 - 23 mg/dL 21  19  23    Creatinine 0.44 - 1.00 mg/dL 8.88  9.15  8.97   Sodium 135 - 145 mmol/L 136  136  136   Potassium 3.5 - 5.1 mmol/L 4.0  3.8  3.3   Chloride 98 - 111 mmol/L 107  109  107   CO2 22 - 32 mmol/L 22  19  21    Calcium  8.9 - 10.3 mg/dL 8.6  8.4  8.5     BNP    Component Value Date/Time   BNP 14.4 01/30/2012 1700    ProBNP    Component Value Date/Time   PROBNP 16.0 11/18/2021 0938    PFT    Component Value Date/Time   FEV1PRE 0.99 09/27/2021 1009   FEV1POST 1.05 09/27/2021 1009   FVCPRE 1.71 09/27/2021 1009   FVCPOST 1.82 09/27/2021 1009   DLCOUNC 9.41 09/27/2021 1009   PREFEV1FVCRT 58 09/27/2021 1009   PSTFEV1FVCRT 58 09/27/2021 1009    NM PET Image Initial (PI) Skull Base To Thigh Result Date:  02/26/2024 CLINICAL DATA:  Initial treatment strategy for lung nodule. EXAM: NUCLEAR MEDICINE PET SKULL BASE TO THIGH TECHNIQUE: 10.21  mCi F-18 FDG was injected intravenously. Full-ring PET imaging was performed from the skull base to thigh after the radiotracer. CT data was obtained and used for attenuation correction and anatomic localization. Fasting blood glucose: 112 mg/dl COMPARISON:  Chest CT without contrast 02/10/2024 FINDINGS: Mediastinal blood pool activity: SUV max 2.0 Liver activity: SUV max 2.7 NECK: No specific abnormal uptake seen in the neck including along lymph node change of the submandibular, posterior triangle or internal jugular regions. Mild paraspinal muscle uptake on the left side. Near symmetric uptake of the visualized intracranial compartment. Incidental CT findings: The parotid glands, submandibular glands thyroid  gland unremarkable. Visualized portions of the paranasal sinuses and mastoid air cells are clear. Nasal septal deviation. CHEST: Previous study demonstrated a spiculated left upper lobe mass. Today the lesion is more cavitary on image 48 of the CT scan. Dimension today of 19 x 18 mm on image 40 of series 202. Previously measured at 19 x 16 mm. This has abnormal uptake of maximum SUV of 10.3. No additional areas of abnormal lung uptake. No abnormal uptake above blood pool in the axillary regions, hilum or mediastinum. Incidental CT findings: Emphysematous lung changes identified. There is some scattered areas of scarring and fibrotic changes. No consolidation, pneumothorax or effusion. Please correlate with prior CT scan. Scattered vascular calcifications. Patulous esophagus. Coronary artery calcifications are seen. Please correlate for other coronary risk factors. No pericardial effusion. ABDOMEN/PELVIS: There is physiologic distribution of radiotracer along the parenchymal organs and renal collecting systems. There is some slight asymmetric uptake along the ascending colon.  This could be a variant of physiologic distribution. Please correlate with clinical history and any history of previous colonic evaluation and screening. Incidental CT findings: Grossly, the liver, spleen, adrenal glands are unremarkable. Diffuse fatty atrophy of the pancreas. Gallbladder is mildly distended. Nonobstructing left-sided renal stone. No ureteral stone. Large bowel has a normal course and caliber. Scattered colonic stool. Sigmoid colon diverticula. Normal appendix. Stomach and small bowel are nondilated. Scattered vascular calcifications. Normal caliber aorta and IVC. SKELETON: No abnormal uptake along the visualized osseous structures. Incidental CT findings: Scattered degenerative changes. IMPRESSION: Left upper lobe spiculated mass on prior examination today appears slightly more cavitary. This has significant abnormal uptake worrisome for neoplasm. No additional areas of abnormal uptake identified at this time. The mild asymmetric uptake along the ascending colon could be a variant of bowel distribution. Please correlate with any history and symptoms. Colonic diverticula. Nonobstructing left-sided renal stone. Emphysematous lung changes. Coronary artery calcifications. Electronically Signed   By: Ranell Bring M.D.   On: 02/26/2024 14:51   CT Chest Wo Contrast Result Date: 02/10/2024 CLINICAL DATA:  Abnormal chest radiograph with left upper lobe nodule. * Tracking Code: BO * EXAM: CT CHEST WITHOUT CONTRAST TECHNIQUE: Multidetector CT imaging of the chest was performed following the standard protocol without IV contrast. RADIATION DOSE REDUCTION: This exam was performed according to the departmental dose-optimization program which includes automated exposure control, adjustment of the mA and/or kV according to patient size and/or use of iterative reconstruction technique. COMPARISON:  Chest radiograph 01/27/2024 and CT chest 08/05/2022. FINDINGS: Cardiovascular: Atherosclerotic calcification of the  aorta, aortic valve and coronary arteries. Heart is at the upper limits of normal in size. No pericardial effusion. Mediastinum/Nodes: No pathologically enlarged mediastinal or axillary lymph nodes. Hilar regions are difficult to definitively evaluate without IV contrast. Esophagus is grossly unremarkable. Lungs/Pleura: Centrilobular and paraseptal emphysema. Spiculated left upper lobe nodule measures 1.6 x 1.9 cm, with extensions to the  adjacent visceral pleura. No pleural fluid. Airway is unremarkable. Upper Abdomen: Visualized portions of the liver, gallbladder, adrenal glands, kidneys, spleen, pancreas, stomach and bowel are grossly unremarkable. No upper abdominal adenopathy. Musculoskeletal: Degenerative changes in the spine. No worrisome lytic or sclerotic lesions. IMPRESSION: 1. 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 2. Aortic atherosclerosis (ICD10-I70.0). Coronary artery calcification. 3.  Emphysema (ICD10-J43.9). Electronically Signed   By: Newell Eke M.D.   On: 02/10/2024 12:01     Past medical hx Past Medical History:  Diagnosis Date   Allergic rhinitis    Anxiety    Arthritis    hands and legs and feet (04/27/2012)   Asthma    Cancer (HCC) 2005   rt arm mole rem cancer   CHF (congestive heart failure) (HCC)    Chronic bronchitis (HCC)    keep it all the time (04/27/2012)   Chronic kidney disease    Chronic kidney disease (CKD)    Stage III   COPD (chronic obstructive pulmonary disease) (HCC)    COVID-19 virus infection    03-2021   Depression    PT DENIES   Diverticulosis    DJD (degenerative joint disease)    Emphysema    Epilepsy (HCC)    Epilepsy (HCC)    Esophageal stricture    Exertional dyspnea    GERD (gastroesophageal reflux disease)    Glaucoma    H/O blood clots    Right leg   Heart failure (HCC)    History of COVID-19 03/2021   History of kidney stones    History of nuclear stress test    Myoview   12/25/22: EF 74, no ischemia or infarction, low risk   Hyperlipidemia    Hypertension    IBS (irritable bowel syndrome)    Migraine    Neck pain    Cervical disc   OSA on CPAP    6-7 yrs; pt wears CPAP but has not been able to use it lately due to sinus issues   Oxygen  deficiency    USES 3 LITERS 02 CONTINOUS   Pneumonia 2012; 04/27/2012   Recurrent upper respiratory infection (URI)    Right leg DVT (HCC) 1982   groin   Seizures (HCC) 06/2023   Sleep apnea    Type II diabetes mellitus (HCC)    No meds since weight loss   UTI (urinary tract infection)    2 weeks ago   Vertigo 10/24/2014     Social History   Tobacco Use   Smoking status: Every Day    Current packs/day: 0.25    Average packs/day: 0.5 packs/day for 46.9 years (23.1 ttl pk-yrs)    Types: Cigarettes    Start date: 08/12/1975    Last attempt to quit: 12/30/2020   Smokeless tobacco: Never   Tobacco comments:    1/2 pack a day 02/17/2024 KRD  Vaping Use   Vaping status: Never Used  Substance Use Topics   Alcohol use: No    Alcohol/week: 0.0 standard drinks of alcohol   Drug use: No    Ms.Earwood reports that she has been smoking cigarettes. She started smoking about 48 years ago. She has a 23.1 pack-year smoking history. She has never used smokeless tobacco. She reports that she does not drink alcohol and does not use drugs.  Tobacco Cessation: Ready to quit: Not Answered Counseling given: Not Answered Tobacco comments: 1/2 pack a day 02/17/2024 KRD Current everyday smoker with a 23.1-pack-year smoking history  I spent 3-4 minutes counseling patient on  steps to stop use of tobacco products. I have provided patient with information on receiving free nicotine  replacement therapy, and contact numbers for hypnosis for smoking cessation as well as acupuncture for smoking cessation.   Past surgical hx, Family hx, Social hx all reviewed.  Current Outpatient Medications on File Prior to Visit  Medication Sig   albuterol   (PROAIR  HFA) 108 (90 Base) MCG/ACT inhaler Inhale 2 puffs into the lungs every 6 (six) hours as needed for shortness of breath or wheezing.   albuterol  (PROVENTIL ) (2.5 MG/3ML) 0.083% nebulizer solution USE 1 VIAL IN NEBULIZER EVERY 6 HOURS AS NEEDED FOR SHORTNESS OF BREATH (Patient taking differently: Take 2.5 mg by nebulization every 6 (six) hours as needed for wheezing or shortness of breath.)   ALPRAZolam  (XANAX ) 0.25 MG tablet Take 0.25 mg by mouth 2 (two) times daily.   Cholecalciferol  (VITAMIN D3) 50 MCG (2000 UT) capsule TAKE ONE CAPSULE BY MOUTH DAILY (Patient taking differently: Take 2,000 Units by mouth daily.)   famotidine  (PEPCID ) 40 MG tablet Take 40 mg by mouth at bedtime.   ferrous sulfate  325 (65 FE) MG tablet Take 325 mg by mouth daily with breakfast.   fluticasone  (FLONASE ) 50 MCG/ACT nasal spray Place 1 spray into both nostrils daily.   gabapentin  (NEURONTIN ) 100 MG capsule Take 1 capsule (100 mg total) by mouth at bedtime. (Patient taking differently: Take 200 mg by mouth 3 (three) times daily.)   guaiFENesin  (MUCINEX ) 600 MG 12 hr tablet Take 2 tablets (1,200 mg total) by mouth 2 (two) times daily as needed. (Patient taking differently: Take 1,200 mg by mouth 2 (two) times daily as needed for cough or to loosen phlegm.)   ipratropium-albuterol  (DUONEB) 0.5-2.5 (3) MG/3ML SOLN Take 3 mLs by nebulization every 6 (six) hours as needed (Wheezing/sob).   levETIRAcetam  (KEPPRA ) 250 MG tablet Take 250 mg by mouth 2 (two) times daily.   loratadine  (ALLERGY RELIEF) 10 MG tablet Take 1 tablet (10 mg total) by mouth daily.   methocarbamol (ROBAXIN) 750 MG tablet Take 750 mg by mouth 3 (three) times daily.   metoCLOPramide  (REGLAN ) 5 MG tablet Take 1 tablet (5 mg total) by mouth every 12 (twelve) hours as needed for up to 2 doses for nausea. Take 30-45 minutes before evening and AM doses of bowel preparation solution.   montelukast  (SINGULAIR ) 10 MG tablet TAKE ONE TABLET BY MOUTH AT BEDTIME    OXYGEN  Inhale 3 L into the lungs continuous.   pantoprazole  (PROTONIX ) 40 MG tablet Take 1 tablet (40 mg total) by mouth 2 (two) times daily. **PLEASE CALL OFFICE TO SCHEDULE FOLLOW UP   predniSONE  (DELTASONE ) 10 MG tablet Take 1 tablet (10 mg total) by mouth daily with breakfast. 10 mg take 4 each am x 2 days, 2 each am x 2 days, 1 each am x 2 days and stop   QUEtiapine  (SEROQUEL ) 100 MG tablet TAKE ONE TABLET BY MOUTH AT BEDTIME   simvastatin  (ZOCOR ) 40 MG tablet TAKE ONE TABLET BY MOUTH AT BEDTIME (Patient taking differently: Take 40 mg by mouth daily at 6 PM.)   topiramate  (TOPAMAX ) 100 MG tablet Take 2 tablets (200 mg total) by mouth 2 (two) times daily.   traMADol  (ULTRAM ) 50 MG tablet Take 1 tablet (50 mg total) by mouth every 6 (six) hours as needed.   TRELEGY ELLIPTA  200-62.5-25 MCG/ACT AEPB Inhale 1 puff into the lungs daily.   triamcinolone  cream (KENALOG ) 0.1 % Apply 1  Application topically daily as needed (irritation).   No current facility-administered medications on file prior to visit.     Allergies  Allergen Reactions   Amoxicillin Anaphylaxis    Breakout, mouth swells   Azithromycin Swelling and Rash    swelling all over, hives, thrush   Cefdinir      Throat swelling after 3rd dose   Chantix  [Varenicline ] Other (See Comments)    bad dreams, difficulty breathing   Clindamycin  Anaphylaxis   Doxycycline  Hyclate Anaphylaxis and Rash    swelling   Levofloxacin  Anaphylaxis   Paxlovid  [Nirmatrelvir -Ritonavir ] Swelling    Throat swelling   Penicillins Anaphylaxis, Shortness Of Breath and Rash    ALMOST DIED, ended up in hospital    Sulfonamide Derivatives Shortness Of Breath, Swelling and Rash    break out from head to toe   Toradol  [Ketorolac  Tromethamine ] Other (See Comments)    Seizure    Clarithromycin Hives    swelling   Nortriptyline Swelling, Rash and Other (See Comments)    Per patient had kidney problems on this medication, could not use bathroom  (urinary retention)   Moxifloxacin     Codeine  Nausea And Vomiting   Fluticasone -Salmeterol Other (See Comments)    REACTION: ulcers in mouth   Triptans Rash    Mouth breaks out and start itching    Review Of Systems:  Constitutional:   No  weight loss, night sweats,  Fevers, chills, fatigue, or  lassitude.  HEENT:   No headaches,  Difficulty swallowing,  Tooth/dental problems, or  Sore throat,                No sneezing, itching, ear ache, nasal congestion, post nasal drip,   CV:  No chest pain,  Orthopnea, PND, swelling in lower extremities, anasarca, dizziness, palpitations, syncope.   GI  No heartburn, indigestion, abdominal pain, nausea, vomiting, diarrhea, change in bowel habits, loss of appetite, bloody stools.   Resp: + shortness of breath with exertion less at rest.  + Baseline excess mucus, + baseline productive cough,  No non-productive cough,  No coughing up of blood.  No change in color of mucus.  +  wheezing.  No chest wall deformity  Skin: no rash or lesions.  GU: no dysuria, change in color of urine, no urgency or frequency.  No flank pain, no hematuria   MS:  No joint pain or swelling.  + decreased range of motion.  No back pain.  Psych:  No change in mood or affect. No depression or anxiety.  No memory loss.   Vital Signs BP 98/72 (BP Location: Left Arm, Patient Position: Sitting, Cuff Size: Large)   Pulse 90   Ht 5' (1.524 m)   Wt 193 lb (87.5 kg)   SpO2 98% Comment: 3liters cont. [place]  BMI 37.69 kg/m    Physical Exam:  General- No distress,  A&Ox3, pleasant ENT: No sinus tenderness, TM clear, pale nasal mucosa, no oral exudate,no post nasal drip, no LAN Cardiac: S1, S2, regular rate and rhythm, no murmur Chest: + wheeze/ No rales/ dullness; no accessory muscle use, no nasal flaring, no sternal retractions, diminished per bases Abd.: Soft Non-tender, nondistended, bowel sounds positive,Body mass index is 37.69 kg/m.  Ext: No clubbing cyanosis, edema,  no obvious deformities Neuro: Physical deconditioning, moving all extremities x 4, alert and oriented x 3 Skin: No rashes, warm and dry, no obvious skin lesions Psych: normal mood and behavior   Assessment/Plan Spiculated left upper lobe nodule, indicative of stage  IA primary bronchogenic carcinoma. Hypermetabolic on PET with an SUV of 10.3 Current everyday smoker Chronic hypoxic respiratory failure on home oxygen  at 3 L Currently wheezing Plan We have reviewed your PET scan. The nodule of concern in your left upper lobe was concerning on PET. We have decided to move froward with a biopsy to better evaluate this finding. I have placed an order for a bronchoscopy with biopsies.  We have discussed the procedure in detail.  We have reviewed the risks and benefits of the procedure. These include bleeding, infection, puncture of the lung, and adverse reaction to anesthesia. You have agreed to proceed with biopsy to evaluate the left upper lobe Your procedure will be done by Dr. Lamar Chris. You will receive a letter today with date time and information pertaining to the procedure. You will need someone to drive you to the procedure, stay with you during the procedure, and stay with you after the procedure. You will also need someone to stay with you for 24 hours after anesthesia to ensure you have cleared and are doing well. You will follow-up with me 1 week after the procedure to review the results and to ensure you are doing well. Call if you need us  prior to the procedure or if you have any questions at all. Please contact office for sooner follow up if symptoms do not improve or worsen or seek emergency care     Stop Aspirin  x 48 hours before procedure. I have called in a prednisone  taper for you wheezing. 10 mg take 4 each am x 2 days, 2 each am x 2 days, 1 each am x 2 days and stop  Call if you need us  sooner.  Please contact office for sooner follow up if symptoms do not improve  or worsen or seek emergency care    I spent 40 minutes dedicated to the care of this patient on the date of this encounter to include pre-visit review of records, face-to-face time with the patient discussing conditions above, post visit ordering of testing, clinical documentation with the electronic health record, making appropriate referrals as documented, and communicating necessary information to the patient's healthcare team.    Lauraine JULIANNA Lites, NP 03/01/2024  3:21 PM

## 2024-03-01 NOTE — Telephone Encounter (Signed)
 Patient has been scheduled for office visit to discuss today at 3 with [name] to go over pet scan nfn

## 2024-03-01 NOTE — Telephone Encounter (Signed)
 Please schedule the following:  Provider performing procedure: [place] Diagnosis:  Lung nodule Which side if for nodule / mass? Left upper lobe Procedure:  Navigational bronchoscopy with biopsies  Has patient been spoken to by Provider and given informed consent? Yes, to Lauraine Lites NP on 03/01/2024 Anesthesia:  General Do you need Fluro?  Yes Duration of procedure:  1.5  Date: 03/15/2024 Alternate Date:  This date is open  Time: Arrival time of 9 am if possible  Location:  [facility] Does patient have OSA?  Yes, she does not treat it, DM?  No Or Latex allergy?  No Medication Restriction/ Anticoagulate/Antiplatelet:  Hold aspirin  x 48 hours before procedure.  Pre-op Labs Ordered:determined by Anesthesia Imaging request:  None (If, SuperDimension CT Chest, please have STAT courier sent to ENDO)

## 2024-03-01 NOTE — Progress Notes (Unsigned)
 History of Present Illness [name] is a 68 y.o. female current every day smoker ( 23.1 pack year history) referred for by Dr. Darlean for abnormal chest imaging. She will be followed by Dr. Shelah.   Past Medical History:  Diagnosis Date   Allergic rhinitis    Anxiety    Arthritis    hands and legs and feet (04/27/2012)   Asthma    Cancer (HCC) 2005   rt arm mole rem cancer   CHF (congestive heart failure) (HCC)    Chronic bronchitis (HCC)    keep it all the time (04/27/2012)   Chronic kidney disease    Chronic kidney disease (CKD)    Stage III   COPD (chronic obstructive pulmonary disease) (HCC)    COVID-19 virus infection    03-2021   Depression    PT DENIES   Diverticulosis    DJD (degenerative joint disease)    Emphysema    Epilepsy (HCC)    Epilepsy (HCC)    Esophageal stricture    Exertional dyspnea    GERD (gastroesophageal reflux disease)    Glaucoma    H/O blood clots    Right leg   Heart failure (HCC)    History of COVID-19 03/2021   History of kidney stones    History of nuclear stress test    Myoview  12/25/22: EF 74, no ischemia or infarction, low risk   Hyperlipidemia    Hypertension    IBS (irritable bowel syndrome)    Migraine    Neck pain    Cervical disc   OSA on CPAP    6-7 yrs; pt wears CPAP but has not been able to use it lately due to sinus issues   Oxygen  deficiency    USES 3 LITERS 02 CONTINOUS   Pneumonia 2012; 04/27/2012   Recurrent upper respiratory infection (URI)    Right leg DVT (HCC) 1982   groin   Seizures (HCC) 06/2023   Sleep apnea    Type II diabetes mellitus (HCC)    No meds since weight loss   UTI (urinary tract infection)    2 weeks ago   Vertigo 10/24/2014      03/01/2024 Pt. Presents for follow up PET scan, ordered as further work up of her abnormal CT chest.  She states she has been doing well.  No respiratory complaints.    [name] is a 68 year old female who presents for evaluation of a left upper lobe  lung nodule. She is accompanied by her grandchild, who is her primary support. She was referred by Dr. Darlean for further evaluation of a lung nodule.  Patient has a significant medical history as detailed above.  A left upper lobe lung nodule was initially identified on a CT scan and further evaluated with a PET scan, which showed significant abnormal uptake on PET. The nodule measures 1.6 by 1.9 centimeters, with a slight increase noted on the PET scan compared to the CT scan and a short interval.  SUV max is noted to be 10.3.  We discussed the best options moving forward.  I explained that I felt like the best option was to proceed with bronchoscopy with biopsies for definitive tissue diagnosis.  We reviewed the risks involved in the procedure.  Patient feels it is in her best interest to move forward with navigational bronchoscopy with biopsies.  We will schedule her for a biopsy for definitive tissue diagnosis.  Mrs. Som has a history  of heart failure, which is currently resolved, and well-managed.   She is not on any medication for heart failure at present. She is under the care of a cardiologist.  She reports a recent cold and is concerned about a potential COPD flare. She has a complex allergy profile, including allergies to doxycycline , amoxicillin, and Cipro , which complicates her treatment options. She has previously taken Cipro  with good results, but it is listed as an allergy significant for anaphylaxis in her records. I will send in a prednisone  taper for her wheezing. Patient is afebrile and does not have discolored secretions therefore I will not treat with antibiotics at this time.  She is currently taking baby aspirin  daily. She also has a history of sleep apnea but is unable to use a CPAP machine.  She experiences weakness, which she attributes to anxiety or other unknown causes.  She wears oxygen  at 3 L nasal cannula for chronic hypoxic respiratory failure.   Test Results: PET  02/25/2024  Left upper lobe spiculated mass on prior examination today appears slightly more cavitary. This has significant abnormal uptake worrisome for neoplasm.  SUV max of 10.3   No additional areas of abnormal uptake identified at this time.   The mild asymmetric uptake along the ascending colon could be a variant of bowel distribution. Please correlate with any history and symptoms.   Colonic diverticula. Nonobstructing left-sided renal stone. Emphysematous lung changes. Coronary artery calcifications.  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 2. Aortic atherosclerosis (ICD10-I70.0). Coronary artery calcification. 3.  Emphysema (ICD10-J43.9).      Latest Ref Rng & Units 02/06/2023    8:09 AM 02/05/2023    5:18 AM 02/04/2023    7:50 PM  CBC  WBC 4.0 - 10.5 K/uL 9.7  18.9  25.7   Hemoglobin 12.0 - 15.0 g/dL 9.4  9.2  89.7   Hematocrit 36.0 - 46.0 % 30.7  29.6  32.8   Platelets 150 - 400 K/uL 250  225  251        Latest Ref Rng & Units 02/06/2023    8:09 AM 02/05/2023    5:18 AM 02/04/2023    7:50 PM  BMP  Glucose 70 - 99 mg/dL 95  94  885   BUN 8 - 23 mg/dL 21  19  23    Creatinine 0.44 - 1.00 mg/dL 8.88  9.15  8.97   Sodium 135 - 145 mmol/L 136  136  136   Potassium 3.5 - 5.1 mmol/L 4.0  3.8  3.3   Chloride 98 - 111 mmol/L 107  109  107   CO2 22 - 32 mmol/L 22  19  21    Calcium  8.9 - 10.3 mg/dL 8.6  8.4  8.5     BNP    Component Value Date/Time   BNP 14.4 01/30/2012 1700    ProBNP    Component Value Date/Time   PROBNP 16.0 11/18/2021 0938    PFT    Component Value Date/Time   FEV1PRE 0.99 09/27/2021 1009   FEV1POST 1.05 09/27/2021 1009   FVCPRE 1.71 09/27/2021 1009   FVCPOST 1.82 09/27/2021 1009   DLCOUNC 9.41 09/27/2021 1009   PREFEV1FVCRT 58 09/27/2021 1009   PSTFEV1FVCRT 58 09/27/2021 1009    NM PET Image Initial (PI) Skull Base To Thigh Result Date:  02/26/2024 CLINICAL DATA:  Initial treatment strategy for lung nodule. EXAM: NUCLEAR MEDICINE PET SKULL BASE TO THIGH TECHNIQUE: 10.21  mCi F-18 FDG was injected intravenously. Full-ring PET imaging was performed from the skull base to thigh after the radiotracer. CT data was obtained and used for attenuation correction and anatomic localization. Fasting blood glucose: 112 mg/dl COMPARISON:  Chest CT without contrast 02/10/2024 FINDINGS: Mediastinal blood pool activity: SUV max 2.0 Liver activity: SUV max 2.7 NECK: No specific abnormal uptake seen in the neck including along lymph node change of the submandibular, posterior triangle or internal jugular regions. Mild paraspinal muscle uptake on the left side. Near symmetric uptake of the visualized intracranial compartment. Incidental CT findings: The parotid glands, submandibular glands thyroid  gland unremarkable. Visualized portions of the paranasal sinuses and mastoid air cells are clear. Nasal septal deviation. CHEST: Previous study demonstrated a spiculated left upper lobe mass. Today the lesion is more cavitary on image 48 of the CT scan. Dimension today of 19 x 18 mm on image 40 of series 202. Previously measured at 19 x 16 mm. This has abnormal uptake of maximum SUV of 10.3. No additional areas of abnormal lung uptake. No abnormal uptake above blood pool in the axillary regions, hilum or mediastinum. Incidental CT findings: Emphysematous lung changes identified. There is some scattered areas of scarring and fibrotic changes. No consolidation, pneumothorax or effusion. Please correlate with prior CT scan. Scattered vascular calcifications. Patulous esophagus. Coronary artery calcifications are seen. Please correlate for other coronary risk factors. No pericardial effusion. ABDOMEN/PELVIS: There is physiologic distribution of radiotracer along the parenchymal organs and renal collecting systems. There is some slight asymmetric uptake along the ascending colon.  This could be a variant of physiologic distribution. Please correlate with clinical history and any history of previous colonic evaluation and screening. Incidental CT findings: Grossly, the liver, spleen, adrenal glands are unremarkable. Diffuse fatty atrophy of the pancreas. Gallbladder is mildly distended. Nonobstructing left-sided renal stone. No ureteral stone. Large bowel has a normal course and caliber. Scattered colonic stool. Sigmoid colon diverticula. Normal appendix. Stomach and small bowel are nondilated. Scattered vascular calcifications. Normal caliber aorta and IVC. SKELETON: No abnormal uptake along the visualized osseous structures. Incidental CT findings: Scattered degenerative changes. IMPRESSION: Left upper lobe spiculated mass on prior examination today appears slightly more cavitary. This has significant abnormal uptake worrisome for neoplasm. No additional areas of abnormal uptake identified at this time. The mild asymmetric uptake along the ascending colon could be a variant of bowel distribution. Please correlate with any history and symptoms. Colonic diverticula. Nonobstructing left-sided renal stone. Emphysematous lung changes. Coronary artery calcifications. Electronically Signed   By: Ranell Bring M.D.   On: 02/26/2024 14:51   CT Chest Wo Contrast Result Date: 02/10/2024 CLINICAL DATA:  Abnormal chest radiograph with left upper lobe nodule. * Tracking Code: BO * EXAM: CT CHEST WITHOUT CONTRAST TECHNIQUE: Multidetector CT imaging of the chest was performed following the standard protocol without IV contrast. RADIATION DOSE REDUCTION: This exam was performed according to the departmental dose-optimization program which includes automated exposure control, adjustment of the mA and/or kV according to patient size and/or use of iterative reconstruction technique. COMPARISON:  Chest radiograph 01/27/2024 and CT chest 08/05/2022. FINDINGS: Cardiovascular: Atherosclerotic calcification of the  aorta, aortic valve and coronary arteries. Heart is at the upper limits of normal in size. No pericardial effusion. Mediastinum/Nodes: No pathologically enlarged mediastinal or axillary lymph nodes. Hilar regions are difficult to definitively evaluate without IV contrast. Esophagus is grossly unremarkable. Lungs/Pleura: Centrilobular and paraseptal emphysema. Spiculated left upper lobe nodule measures 1.6 x 1.9 cm, with extensions to the  adjacent visceral pleura. No pleural fluid. Airway is unremarkable. Upper Abdomen: Visualized portions of the liver, gallbladder, adrenal glands, kidneys, spleen, pancreas, stomach and bowel are grossly unremarkable. No upper abdominal adenopathy. Musculoskeletal: Degenerative changes in the spine. No worrisome lytic or sclerotic lesions. IMPRESSION: 1. 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 2. Aortic atherosclerosis (ICD10-I70.0). Coronary artery calcification. 3.  Emphysema (ICD10-J43.9). Electronically Signed   By: Newell Eke M.D.   On: 02/10/2024 12:01     Past medical hx Past Medical History:  Diagnosis Date   Allergic rhinitis    Anxiety    Arthritis    hands and legs and feet (04/27/2012)   Asthma    Cancer (HCC) 2005   rt arm mole rem cancer   CHF (congestive heart failure) (HCC)    Chronic bronchitis (HCC)    keep it all the time (04/27/2012)   Chronic kidney disease    Chronic kidney disease (CKD)    Stage III   COPD (chronic obstructive pulmonary disease) (HCC)    COVID-19 virus infection    03-2021   Depression    PT DENIES   Diverticulosis    DJD (degenerative joint disease)    Emphysema    Epilepsy (HCC)    Epilepsy (HCC)    Esophageal stricture    Exertional dyspnea    GERD (gastroesophageal reflux disease)    Glaucoma    H/O blood clots    Right leg   Heart failure (HCC)    History of COVID-19 03/2021   History of kidney stones    History of nuclear stress test    Myoview   12/25/22: EF 74, no ischemia or infarction, low risk   Hyperlipidemia    Hypertension    IBS (irritable bowel syndrome)    Migraine    Neck pain    Cervical disc   OSA on CPAP    6-7 yrs; pt wears CPAP but has not been able to use it lately due to sinus issues   Oxygen  deficiency    USES 3 LITERS 02 CONTINOUS   Pneumonia 2012; 04/27/2012   Recurrent upper respiratory infection (URI)    Right leg DVT (HCC) 1982   groin   Seizures (HCC) 06/2023   Sleep apnea    Type II diabetes mellitus (HCC)    No meds since weight loss   UTI (urinary tract infection)    2 weeks ago   Vertigo 10/24/2014     Social History   Tobacco Use   Smoking status: Every Day    Current packs/day: 0.25    Average packs/day: 0.5 packs/day for 46.9 years (23.1 ttl pk-yrs)    Types: Cigarettes    Start date: 08/12/1975    Last attempt to quit: 12/30/2020   Smokeless tobacco: Never   Tobacco comments:    1/2 pack a day 02/17/2024 KRD  Vaping Use   Vaping status: Never Used  Substance Use Topics   Alcohol use: No    Alcohol/week: 0.0 standard drinks of alcohol   Drug use: No    Ms.Lemoine reports that she has been smoking cigarettes. She started smoking about 48 years ago. She has a 23.1 pack-year smoking history. She has never used smokeless tobacco. She reports that she does not drink alcohol and does not use drugs.  Tobacco Cessation: Ready to quit: Not Answered Counseling given: Not Answered Tobacco comments: 1/2 pack a day 02/17/2024 KRD Current everyday smoker with a 23.1-pack-year smoking history  I spent 3-4 minutes counseling patient on  steps to stop use of tobacco products. I have provided patient with information on receiving free nicotine  replacement therapy, and contact numbers for hypnosis for smoking cessation as well as acupuncture for smoking cessation.   Past surgical hx, Family hx, Social hx all reviewed.  Current Outpatient Medications on File Prior to Visit  Medication Sig   albuterol   (PROAIR  HFA) 108 (90 Base) MCG/ACT inhaler Inhale 2 puffs into the lungs every 6 (six) hours as needed for shortness of breath or wheezing.   albuterol  (PROVENTIL ) (2.5 MG/3ML) 0.083% nebulizer solution USE 1 VIAL IN NEBULIZER EVERY 6 HOURS AS NEEDED FOR SHORTNESS OF BREATH (Patient taking differently: Take 2.5 mg by nebulization every 6 (six) hours as needed for wheezing or shortness of breath.)   ALPRAZolam  (XANAX ) 0.25 MG tablet Take 0.25 mg by mouth 2 (two) times daily.   Cholecalciferol  (VITAMIN D3) 50 MCG (2000 UT) capsule TAKE ONE CAPSULE BY MOUTH DAILY (Patient taking differently: Take 2,000 Units by mouth daily.)   famotidine  (PEPCID ) 40 MG tablet Take 40 mg by mouth at bedtime.   ferrous sulfate  325 (65 FE) MG tablet Take 325 mg by mouth daily with breakfast.   fluticasone  (FLONASE ) 50 MCG/ACT nasal spray Place 1 spray into both nostrils daily.   gabapentin  (NEURONTIN ) 100 MG capsule Take 1 capsule (100 mg total) by mouth at bedtime. (Patient taking differently: Take 200 mg by mouth 3 (three) times daily.)   guaiFENesin  (MUCINEX ) 600 MG 12 hr tablet Take 2 tablets (1,200 mg total) by mouth 2 (two) times daily as needed. (Patient taking differently: Take 1,200 mg by mouth 2 (two) times daily as needed for cough or to loosen phlegm.)   ipratropium-albuterol  (DUONEB) 0.5-2.5 (3) MG/3ML SOLN Take 3 mLs by nebulization every 6 (six) hours as needed (Wheezing/sob).   levETIRAcetam  (KEPPRA ) 250 MG tablet Take 250 mg by mouth 2 (two) times daily.   loratadine  (ALLERGY RELIEF) 10 MG tablet Take 1 tablet (10 mg total) by mouth daily.   methocarbamol (ROBAXIN) 750 MG tablet Take 750 mg by mouth 3 (three) times daily.   metoCLOPramide  (REGLAN ) 5 MG tablet Take 1 tablet (5 mg total) by mouth every 12 (twelve) hours as needed for up to 2 doses for nausea. Take 30-45 minutes before evening and AM doses of bowel preparation solution.   montelukast  (SINGULAIR ) 10 MG tablet TAKE ONE TABLET BY MOUTH AT BEDTIME    OXYGEN  Inhale 3 L into the lungs continuous.   pantoprazole  (PROTONIX ) 40 MG tablet Take 1 tablet (40 mg total) by mouth 2 (two) times daily. **PLEASE CALL OFFICE TO SCHEDULE FOLLOW UP   predniSONE  (DELTASONE ) 10 MG tablet Take 1 tablet (10 mg total) by mouth daily with breakfast. 10 mg take 4 each am x 2 days, 2 each am x 2 days, 1 each am x 2 days and stop   QUEtiapine  (SEROQUEL ) 100 MG tablet TAKE ONE TABLET BY MOUTH AT BEDTIME   simvastatin  (ZOCOR ) 40 MG tablet TAKE ONE TABLET BY MOUTH AT BEDTIME (Patient taking differently: Take 40 mg by mouth daily at 6 PM.)   topiramate  (TOPAMAX ) 100 MG tablet Take 2 tablets (200 mg total) by mouth 2 (two) times daily.   traMADol  (ULTRAM ) 50 MG tablet Take 1 tablet (50 mg total) by mouth every 6 (six) hours as needed.   TRELEGY ELLIPTA  200-62.5-25 MCG/ACT AEPB Inhale 1 puff into the lungs daily.   triamcinolone  cream (KENALOG ) 0.1 % Apply 1  Application topically daily as needed (irritation).   No current facility-administered medications on file prior to visit.     Allergies  Allergen Reactions   Amoxicillin Anaphylaxis    Breakout, mouth swells   Azithromycin Swelling and Rash    swelling all over, hives, thrush   Cefdinir      Throat swelling after 3rd dose   Chantix  [Varenicline ] Other (See Comments)    bad dreams, difficulty breathing   Clindamycin  Anaphylaxis   Doxycycline  Hyclate Anaphylaxis and Rash    swelling   Levofloxacin  Anaphylaxis   Paxlovid  [Nirmatrelvir -Ritonavir ] Swelling    Throat swelling   Penicillins Anaphylaxis, Shortness Of Breath and Rash    ALMOST DIED, ended up in hospital    Sulfonamide Derivatives Shortness Of Breath, Swelling and Rash    break out from head to toe   Toradol  [Ketorolac  Tromethamine ] Other (See Comments)    Seizure    Clarithromycin Hives    swelling   Nortriptyline Swelling, Rash and Other (See Comments)    Per patient had kidney problems on this medication, could not use bathroom  (urinary retention)   Moxifloxacin     Codeine  Nausea And Vomiting   Fluticasone -Salmeterol Other (See Comments)    REACTION: ulcers in mouth   Triptans Rash    Mouth breaks out and start itching    Review Of Systems:  Constitutional:   No  weight loss, night sweats,  Fevers, chills, fatigue, or  lassitude.  HEENT:   No headaches,  Difficulty swallowing,  Tooth/dental problems, or  Sore throat,                No sneezing, itching, ear ache, nasal congestion, post nasal drip,   CV:  No chest pain,  Orthopnea, PND, swelling in lower extremities, anasarca, dizziness, palpitations, syncope.   GI  No heartburn, indigestion, abdominal pain, nausea, vomiting, diarrhea, change in bowel habits, loss of appetite, bloody stools.   Resp: + shortness of breath with exertion less at rest.  + Baseline excess mucus, + baseline productive cough,  No non-productive cough,  No coughing up of blood.  No change in color of mucus.  +  wheezing.  No chest wall deformity  Skin: no rash or lesions.  GU: no dysuria, change in color of urine, no urgency or frequency.  No flank pain, no hematuria   MS:  No joint pain or swelling.  + decreased range of motion.  No back pain.  Psych:  No change in mood or affect. No depression or anxiety.  No memory loss.   Vital Signs BP 98/72 (BP Location: Left Arm, Patient Position: Sitting, Cuff Size: Large)   Pulse 90   Ht 5' (1.524 m)   Wt 193 lb (87.5 kg)   SpO2 98% Comment: 3liters cont.   BMI 37.69 kg/m    Physical Exam:  General- No distress,  A&Ox3, pleasant ENT: No sinus tenderness, TM clear, pale nasal mucosa, no oral exudate,no post nasal drip, no LAN Cardiac: S1, S2, regular rate and rhythm, no murmur Chest: + wheeze/ No rales/ dullness; no accessory muscle use, no nasal flaring, no sternal retractions, diminished per bases Abd.: Soft Non-tender, nondistended, bowel sounds positive,Body mass index is 37.69 kg/m.  Ext: No clubbing cyanosis, edema,  no obvious deformities Neuro: Physical deconditioning, moving all extremities x 4, alert and oriented x 3 Skin: No rashes, warm and dry, no obvious skin lesions Psych: normal mood and behavior   Assessment/Plan Spiculated left upper lobe nodule, indicative of stage  IA primary bronchogenic carcinoma. Hypermetabolic on PET with an SUV of 10.3 Current everyday smoker Chronic hypoxic respiratory failure on home oxygen  at 3 L Currently wheezing Plan We have reviewed your PET scan. The nodule of concern in your left upper lobe was concerning on PET. We have decided to move froward with a biopsy to better evaluate this finding. I have placed an order for a bronchoscopy with biopsies.  We have discussed the procedure in detail.  We have reviewed the risks and benefits of the procedure. These include bleeding, infection, puncture of the lung, and adverse reaction to anesthesia. You have agreed to proceed with biopsy to evaluate the left upper lobe Your procedure will be done by Dr. Lamar Chris. You will receive a letter today with date time and information pertaining to the procedure. You will need someone to drive you to the procedure, stay with you during the procedure, and stay with you after the procedure. You will also need someone to stay with you for 24 hours after anesthesia to ensure you have cleared and are doing well. You will follow-up with me 1 week after the procedure to review the results and to ensure you are doing well. Call if you need us  prior to the procedure or if you have any questions at all. Please contact office for sooner follow up if symptoms do not improve or worsen or seek emergency care     Stop Aspirin  x 48 hours before procedure. I have called in a prednisone  taper for you wheezing. 10 mg take 4 each am x 2 days, 2 each am x 2 days, 1 each am x 2 days and stop  Call if you need us  sooner.  Please contact office for sooner follow up if symptoms do not improve  or worsen or seek emergency care    I spent 40 minutes dedicated to the care of this patient on the date of this encounter to include pre-visit review of records, face-to-face time with the patient discussing conditions above, post visit ordering of testing, clinical documentation with the electronic health record, making appropriate referrals as documented, and communicating necessary information to the patient's healthcare team.    Lauraine JULIANNA Lites, NP 03/01/2024  3:21 PM

## 2024-03-01 NOTE — Telephone Encounter (Signed)
 Case# [id number] letter given by Corliss will send to [facility] to get auth

## 2024-03-02 ENCOUNTER — Encounter: Payer: Self-pay | Admitting: Acute Care

## 2024-03-02 NOTE — Progress Notes (Incomplete)
 History of Present Illness [name] is a 69 y.o. female current every day smoker ( 23.1 pack year history) referred for by Dr. Darlean for abnormal chest imaging. She will be followed by Dr. Shelah.    03/01/2024 Pt. Presents for follow up PET scan, ordered as further work up of her abnormal CT chest.  She states she has been doing well.  No respiratory complaints.      Test Results: PET 02/25/2024  Left upper lobe spiculated mass on prior examination today appears slightly more cavitary. This has significant abnormal uptake worrisome for neoplasm.   No additional areas of abnormal uptake identified at this time.   The mild asymmetric uptake along the ascending colon could be a variant of bowel distribution. Please correlate with any history and symptoms.   Colonic diverticula. Nonobstructing left-sided renal stone. Emphysematous lung changes. Coronary artery calcifications.  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 2. Aortic atherosclerosis (ICD10-I70.0). Coronary artery calcification. 3.  Emphysema (ICD10-J43.9).      Latest Ref Rng & Units 02/06/2023    8:09 AM 02/05/2023    5:18 AM 02/04/2023    7:50 PM  CBC  WBC 4.0 - 10.5 K/uL 9.7  18.9  25.7   Hemoglobin 12.0 - 15.0 g/dL 9.4  9.2  89.7   Hematocrit 36.0 - 46.0 % 30.7  29.6  32.8   Platelets 150 - 400 K/uL 250  225  251        Latest Ref Rng & Units 02/06/2023    8:09 AM 02/05/2023    5:18 AM 02/04/2023    7:50 PM  BMP  Glucose 70 - 99 mg/dL 95  94  885   BUN 8 - 23 mg/dL 21  19  23    Creatinine 0.44 - 1.00 mg/dL 8.88  9.15  8.97   Sodium 135 - 145 mmol/L 136  136  136   Potassium 3.5 - 5.1 mmol/L 4.0  3.8  3.3   Chloride 98 - 111 mmol/L 107  109  107   CO2 22 - 32 mmol/L 22  19  21    Calcium  8.9 - 10.3 mg/dL 8.6  8.4  8.5     BNP    Component Value Date/Time   BNP 14.4 01/30/2012 1700    ProBNP    Component Value  Date/Time   PROBNP 16.0 11/18/2021 0938    PFT    Component Value Date/Time   FEV1PRE 0.99 09/27/2021 1009   FEV1POST 1.05 09/27/2021 1009   FVCPRE 1.71 09/27/2021 1009   FVCPOST 1.82 09/27/2021 1009   DLCOUNC 9.41 09/27/2021 1009   PREFEV1FVCRT 58 09/27/2021 1009   PSTFEV1FVCRT 58 09/27/2021 1009    NM PET Image Initial (PI) Skull Base To Thigh Result Date: 02/26/2024 CLINICAL DATA:  Initial treatment strategy for lung nodule. EXAM: NUCLEAR MEDICINE PET SKULL BASE TO THIGH TECHNIQUE: 10.21 mCi F-18 FDG was injected intravenously. Full-ring PET imaging was performed from the skull base to thigh after the radiotracer. CT data was obtained and used for attenuation correction and anatomic localization. Fasting blood glucose: 112 mg/dl COMPARISON:  Chest CT without contrast 02/10/2024 FINDINGS: Mediastinal blood pool activity: SUV max 2.0 Liver activity: SUV max 2.7 NECK: No specific abnormal uptake seen in the neck including along lymph node change of the submandibular, posterior triangle or internal jugular regions. Mild paraspinal muscle uptake on the left side. Near symmetric uptake of the visualized intracranial  compartment. Incidental CT findings: The parotid glands, submandibular glands thyroid  gland unremarkable. Visualized portions of the paranasal sinuses and mastoid air cells are clear. Nasal septal deviation. CHEST: Previous study demonstrated a spiculated left upper lobe mass. Today the lesion is more cavitary on image 48 of the CT scan. Dimension today of 19 x 18 mm on image 40 of series 202. Previously measured at 19 x 16 mm. This has abnormal uptake of maximum SUV of 10.3. No additional areas of abnormal lung uptake. No abnormal uptake above blood pool in the axillary regions, hilum or mediastinum. Incidental CT findings: Emphysematous lung changes identified. There is some scattered areas of scarring and fibrotic changes. No consolidation, pneumothorax or effusion. Please correlate with  prior CT scan. Scattered vascular calcifications. Patulous esophagus. Coronary artery calcifications are seen. Please correlate for other coronary risk factors. No pericardial effusion. ABDOMEN/PELVIS: There is physiologic distribution of radiotracer along the parenchymal organs and renal collecting systems. There is some slight asymmetric uptake along the ascending colon. This could be a variant of physiologic distribution. Please correlate with clinical history and any history of previous colonic evaluation and screening. Incidental CT findings: Grossly, the liver, spleen, adrenal glands are unremarkable. Diffuse fatty atrophy of the pancreas. Gallbladder is mildly distended. Nonobstructing left-sided renal stone. No ureteral stone. Large bowel has a normal course and caliber. Scattered colonic stool. Sigmoid colon diverticula. Normal appendix. Stomach and small bowel are nondilated. Scattered vascular calcifications. Normal caliber aorta and IVC. SKELETON: No abnormal uptake along the visualized osseous structures. Incidental CT findings: Scattered degenerative changes. IMPRESSION: Left upper lobe spiculated mass on prior examination today appears slightly more cavitary. This has significant abnormal uptake worrisome for neoplasm. No additional areas of abnormal uptake identified at this time. The mild asymmetric uptake along the ascending colon could be a variant of bowel distribution. Please correlate with any history and symptoms. Colonic diverticula. Nonobstructing left-sided renal stone. Emphysematous lung changes. Coronary artery calcifications. Electronically Signed   By: Ranell Bring M.D.   On: 02/26/2024 14:51   CT Chest Wo Contrast Result Date: 02/10/2024 CLINICAL DATA:  Abnormal chest radiograph with left upper lobe nodule. * Tracking Code: BO * EXAM: CT CHEST WITHOUT CONTRAST TECHNIQUE: Multidetector CT imaging of the chest was performed following the standard protocol without IV contrast. RADIATION  DOSE REDUCTION: This exam was performed according to the departmental dose-optimization program which includes automated exposure control, adjustment of the mA and/or kV according to patient size and/or use of iterative reconstruction technique. COMPARISON:  Chest radiograph 01/27/2024 and CT chest 08/05/2022. FINDINGS: Cardiovascular: Atherosclerotic calcification of the aorta, aortic valve and coronary arteries. Heart is at the upper limits of normal in size. No pericardial effusion. Mediastinum/Nodes: No pathologically enlarged mediastinal or axillary lymph nodes. Hilar regions are difficult to definitively evaluate without IV contrast. Esophagus is grossly unremarkable. Lungs/Pleura: Centrilobular and paraseptal emphysema. Spiculated left upper lobe nodule measures 1.6 x 1.9 cm, with extensions to the adjacent visceral pleura. No pleural fluid. Airway is unremarkable. Upper Abdomen: Visualized portions of the liver, gallbladder, adrenal glands, kidneys, spleen, pancreas, stomach and bowel are grossly unremarkable. No upper abdominal adenopathy. Musculoskeletal: Degenerative changes in the spine. No worrisome lytic or sclerotic lesions. IMPRESSION: 1. 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 2. Aortic atherosclerosis (ICD10-I70.0). Coronary artery calcification. 3.  Emphysema (ICD10-J43.9). Electronically Signed   By: Newell Eke M.D.   On: 02/10/2024 12:01     Past medical hx Past Medical History:  Diagnosis  Date  . Allergic rhinitis   . Anxiety   . Arthritis    hands and legs and feet (04/27/2012)  . Asthma   . Cancer (HCC) 2005   rt arm mole rem cancer  . CHF (congestive heart failure) (HCC)   . Chronic bronchitis (HCC)    keep it all the time (04/27/2012)  . Chronic kidney disease   . Chronic kidney disease (CKD)    Stage III  . COPD (chronic obstructive pulmonary disease) (HCC)   . COVID-19 virus infection    03-2021  . Depression     PT DENIES  . Diverticulosis   . DJD (degenerative joint disease)   . Emphysema   . Epilepsy (HCC)   . Epilepsy (HCC)   . Esophageal stricture   . Exertional dyspnea   . GERD (gastroesophageal reflux disease)   . Glaucoma   . H/O blood clots    Right leg  . Heart failure (HCC)   . History of COVID-19 03/2021  . History of kidney stones   . History of nuclear stress test    Myoview  12/25/22: EF 74, no ischemia or infarction, low risk  . Hyperlipidemia   . Hypertension   . IBS (irritable bowel syndrome)   . Migraine   . Neck pain    Cervical disc  . OSA on CPAP    6-7 yrs; pt wears CPAP but has not been able to use it lately due to sinus issues  . Oxygen  deficiency    USES 3 LITERS 02 CONTINOUS  . Pneumonia 2012; 04/27/2012  . Recurrent upper respiratory infection (URI)   . Right leg DVT (HCC) 1982   groin  . Seizures (HCC) 06/2023  . Sleep apnea   . Type II diabetes mellitus (HCC)    No meds since weight loss  . UTI (urinary tract infection)    2 weeks ago  . Vertigo 10/24/2014     Social History   Tobacco Use  . Smoking status: Every Day    Current packs/day: 0.25    Average packs/day: 0.5 packs/day for 46.9 years (23.1 ttl pk-yrs)    Types: Cigarettes    Start date: 08/12/1975    Last attempt to quit: 12/30/2020  . Smokeless tobacco: Never  . Tobacco comments:    1/2 pack a day 02/17/2024 KRD  Vaping Use  . Vaping status: Never Used  Substance Use Topics  . Alcohol use: No    Alcohol/week: 0.0 standard drinks of alcohol  . Drug use: No    Ms.Volk reports that she has been smoking cigarettes. She started smoking about 48 years ago. She has a 23.1 pack-year smoking history. She has never used smokeless tobacco. She reports that she does not drink alcohol and does not use drugs.  Tobacco Cessation: Ready to quit: Not Answered Counseling given: Not Answered Tobacco comments: 1/2 pack a day 02/17/2024 KRD   Past surgical hx, Family hx, Social hx all  reviewed.  Current Outpatient Medications on File Prior to Visit  Medication Sig  . albuterol  (PROAIR  HFA) 108 (90 Base) MCG/ACT inhaler Inhale 2 puffs into the lungs every 6 (six) hours as needed for shortness of breath or wheezing.  . albuterol  (PROVENTIL ) (2.5 MG/3ML) 0.083% nebulizer solution USE 1 VIAL IN NEBULIZER EVERY 6 HOURS AS NEEDED FOR SHORTNESS OF BREATH (Patient taking differently: Take 2.5 mg by nebulization every 6 (six) hours as needed for wheezing or shortness of breath.)  . ALPRAZolam  (XANAX ) 0.25 MG tablet Take  0.25 mg by mouth 2 (two) times daily.  . Cholecalciferol  (VITAMIN D3) 50 MCG (2000 UT) capsule TAKE ONE CAPSULE BY MOUTH DAILY (Patient taking differently: Take 2,000 Units by mouth daily.)  . famotidine  (PEPCID ) 40 MG tablet Take 40 mg by mouth at bedtime.  . ferrous sulfate  325 (65 FE) MG tablet Take 325 mg by mouth daily with breakfast.  . fluticasone  (FLONASE ) 50 MCG/ACT nasal spray Place 1 spray into both nostrils daily.  . gabapentin  (NEURONTIN ) 100 MG capsule Take 1 capsule (100 mg total) by mouth at bedtime. (Patient taking differently: Take 200 mg by mouth 3 (three) times daily.)  . guaiFENesin  (MUCINEX ) 600 MG 12 hr tablet Take 2 tablets (1,200 mg total) by mouth 2 (two) times daily as needed. (Patient taking differently: Take 1,200 mg by mouth 2 (two) times daily as needed for cough or to loosen phlegm.)  . ipratropium-albuterol  (DUONEB) 0.5-2.5 (3) MG/3ML SOLN Take 3 mLs by nebulization every 6 (six) hours as needed (Wheezing/sob).  . levETIRAcetam  (KEPPRA ) 250 MG tablet Take 250 mg by mouth 2 (two) times daily.  . loratadine  (ALLERGY RELIEF) 10 MG tablet Take 1 tablet (10 mg total) by mouth daily.  . methocarbamol (ROBAXIN) 750 MG tablet Take 750 mg by mouth 3 (three) times daily.  . metoCLOPramide  (REGLAN ) 5 MG tablet Take 1 tablet (5 mg total) by mouth every 12 (twelve) hours as needed for up to 2 doses for nausea. Take 30-45 minutes before evening and AM  doses of bowel preparation solution.  . montelukast  (SINGULAIR ) 10 MG tablet TAKE ONE TABLET BY MOUTH AT BEDTIME  . OXYGEN  Inhale 3 L into the lungs continuous.  . pantoprazole  (PROTONIX ) 40 MG tablet Take 1 tablet (40 mg total) by mouth 2 (two) times daily. **PLEASE CALL OFFICE TO SCHEDULE FOLLOW UP  . predniSONE  (DELTASONE ) 10 MG tablet Take 1 tablet (10 mg total) by mouth daily with breakfast. 10 mg take 4 each am x 2 days, 2 each am x 2 days, 1 each am x 2 days and stop  . QUEtiapine  (SEROQUEL ) 100 MG tablet TAKE ONE TABLET BY MOUTH AT BEDTIME  . simvastatin  (ZOCOR ) 40 MG tablet TAKE ONE TABLET BY MOUTH AT BEDTIME (Patient taking differently: Take 40 mg by mouth daily at 6 PM.)  . topiramate  (TOPAMAX ) 100 MG tablet Take 2 tablets (200 mg total) by mouth 2 (two) times daily.  . traMADol  (ULTRAM ) 50 MG tablet Take 1 tablet (50 mg total) by mouth every 6 (six) hours as needed.  . TRELEGY ELLIPTA  200-62.5-25 MCG/ACT AEPB Inhale 1 puff into the lungs daily.  . triamcinolone  cream (KENALOG ) 0.1 % Apply 1 Application topically daily as needed (irritation).   No current facility-administered medications on file prior to visit.     Allergies  Allergen Reactions  . Amoxicillin Anaphylaxis    Breakout, mouth swells  . Azithromycin Swelling and Rash    swelling all over, hives, thrush  . Cefdinir      Throat swelling after 3rd dose  . Chantix  [Varenicline ] Other (See Comments)    bad dreams, difficulty breathing  . Clindamycin  Anaphylaxis  . Doxycycline  Hyclate Anaphylaxis and Rash    swelling  . Levofloxacin  Anaphylaxis  . Paxlovid  [Nirmatrelvir -Ritonavir ] Swelling    Throat swelling  . Penicillins Anaphylaxis, Shortness Of Breath and Rash    ALMOST DIED, ended up in hospital   . Sulfonamide Derivatives Shortness Of Breath, Swelling and Rash    break out from head to toe  . Toradol  [  Ketorolac  Tromethamine ] Other (See Comments)    Seizure   . Clarithromycin Hives    swelling  .  Nortriptyline Swelling, Rash and Other (See Comments)    Per patient had kidney problems on this medication, could not use bathroom (urinary retention)  . Moxifloxacin    . Codeine  Nausea And Vomiting  . Fluticasone -Salmeterol Other (See Comments)    REACTION: ulcers in mouth  . Triptans Rash    Mouth breaks out and start itching    Review Of Systems:  Constitutional:   No  weight loss, night sweats,  Fevers, chills, fatigue, or  lassitude.  HEENT:   No headaches,  Difficulty swallowing,  Tooth/dental problems, or  Sore throat,                No sneezing, itching, ear ache, nasal congestion, post nasal drip,   CV:  No chest pain,  Orthopnea, PND, swelling in lower extremities, anasarca, dizziness, palpitations, syncope.   GI  No heartburn, indigestion, abdominal pain, nausea, vomiting, diarrhea, change in bowel habits, loss of appetite, bloody stools.   Resp: No shortness of breath with exertion or at rest.  No excess mucus, no productive cough,  No non-productive cough,  No coughing up of blood.  No change in color of mucus.  No wheezing.  No chest wall deformity  Skin: no rash or lesions.  GU: no dysuria, change in color of urine, no urgency or frequency.  No flank pain, no hematuria   MS:  No joint pain or swelling.  No decreased range of motion.  No back pain.  Psych:  No change in mood or affect. No depression or anxiety.  No memory loss.   Vital Signs BP 98/72 (BP Location: Left Arm, Patient Position: Sitting, Cuff Size: Large)   Pulse 90   Ht 5' (1.524 m)   Wt 193 lb (87.5 kg)   SpO2 98% Comment: 3liters cont. [place]  BMI 37.69 kg/m    Physical Exam:  General- No distress,  A&Ox3 ENT: No sinus tenderness, TM clear, pale nasal mucosa, no oral exudate,no post nasal drip, no LAN Cardiac: S1, S2, regular rate and rhythm, no murmur Chest: No wheeze/ rales/ dullness; no accessory muscle use, no nasal flaring, no sternal retractions Abd.: Soft Non-tender Ext: No clubbing  cyanosis, edema Neuro:  normal strength Skin: No rashes, warm and dry Psych: normal mood and behavior   Assessment/Plan  No problem-specific Assessment & Plan notes found for this encounter.    Lauraine JULIANNA Lites, NP 03/01/2024  3:21 PM

## 2024-03-02 NOTE — Progress Notes (Signed)
 Agree with plans.   Lamar Chris, MD, PhD 03/02/2024, 1:39 PM [facility] [phone] or if no answer before 7:00PM call [phone] For any issues after 7:00PM please call eLink [phone]

## 2024-03-11 ENCOUNTER — Other Ambulatory Visit: Payer: Self-pay

## 2024-03-11 ENCOUNTER — Encounter (HOSPITAL_COMMUNITY): Payer: Self-pay | Admitting: Emergency Medicine

## 2024-03-11 NOTE — Progress Notes (Addendum)
 PCP - Lauraine Fus, NP Cardiologist - Dr Dorn Lesches Pulmonology- Dr Lamar Chris Urology - Lauraine Oz, FNP  CT Chest x-ray - 02/09/24 EKG - 01-18-24 Stress Test - 12/25/22 ECHO - 09/03/21 Cardiac Cath - n/a  ICD Pacemaker/Loop - n/a  Sleep Study -  Yes CPAP - does not use CPAP; wears oxygen  at 3L via [place] PRN and at bedtime.  Diabetes Type 2, no meds, does not check blood sugar  Seizure Med - patient is on a new seizure medication, does not know name of medication, pt is not at home.  Informed pt to take seizure med on morning of surgery with sip of water.  Aspirin  & Blood Thinners: n/a  NPO  Anesthesia review: Yes  STOP now taking any Aspirin  (unless otherwise instructed by your surgeon), Aleve, Naproxen, Ibuprofen, Motrin, Advil, Goody's, BC's, all herbal medications, fish oil, and all vitamins.   Coronavirus Screening Do you have any of the following symptoms:  Cough yes/no: No Fever (>100.38F)  yes/no: No Runny nose yes/no: No Sore throat yes/no: No Difficulty breathing/shortness of breath  yes/no: Yes, with exertion  Have you traveled in the last 14 days and where? [place], [place]  Patient verbalized understanding of instructions that were given via phone.

## 2024-03-14 NOTE — Anesthesia Preprocedure Evaluation (Signed)
 Anesthesia Evaluation  Patient identified by MRN, date of birth, ID band Patient awake    Reviewed: Allergy & Precautions, NPO status , Patient's Chart, lab work & pertinent test results, reviewed documented beta blocker date and time   History of Anesthesia Complications Negative for: history of anesthetic complications  Airway Mallampati: I  TM Distance: >3 FB Neck ROM: Full    Dental  (+) Edentulous Lower, Edentulous Upper, Dental Advisory Given   Pulmonary shortness of breath, with exertion and Long-Term Oxygen  Therapy, asthma , sleep apnea , COPD (3L O2 with activity and at night),  COPD inhaler and oxygen  dependent, Current Smoker   Pulmonary exam normal breath sounds clear to auscultation       Cardiovascular hypertension, +CHF  Normal cardiovascular exam Rhythm:Regular Rate:Normal  IMPRESSIONS     1. Left ventricular ejection fraction, by estimation, is 60 to 65%. The  left ventricle has normal function. The left ventricle has no regional  wall motion abnormalities. Left ventricular diastolic parameters are  consistent with Grade I diastolic  dysfunction (impaired relaxation). The average left ventricular global  longitudinal strain is -26.9 %. The global longitudinal strain is normal.   2. Right ventricular systolic function is normal. The right ventricular  size is normal.   3. The mitral valve is normal in structure. No evidence of mitral valve  regurgitation. No evidence of mitral stenosis.   4. The aortic valve is normal in structure. Aortic valve regurgitation is  not visualized. No aortic stenosis is present.   5. The inferior vena cava is normal in size with greater than 50%  respiratory variability, suggesting right atrial pressure of 3 mmHg.      Neuro/Psych  Headaches, Seizures - (last seizure May 2025, noncompliant with anti-epileptics), Well Controlled,  PSYCHIATRIC DISORDERS Anxiety Depression      Neuromuscular disease    GI/Hepatic ,GERD  ,,  Endo/Other  diabetes, Type 2    Renal/GU CRFRenal disease     Musculoskeletal  (+) Arthritis ,    Abdominal   Peds  Hematology  (+) Blood dyscrasia, anemia   Anesthesia Other Findings   Reproductive/Obstetrics                              Anesthesia Physical Anesthesia Plan  ASA: 3  Anesthesia Plan: General   Post-op Pain Management:    Induction: Intravenous  PONV Risk Score and Plan: 2 and Ondansetron  and Dexamethasone   Airway Management Planned: Oral ETT  Additional Equipment:   Intra-op Plan:   Post-operative Plan: Extubation in OR  Informed Consent: I have reviewed the patients History and Physical, chart, labs and discussed the procedure including the risks, benefits and alternatives for the proposed anesthesia with the patient or authorized representative who has indicated his/her understanding and acceptance.     Dental advisory given  Plan Discussed with: CRNA  Anesthesia Plan Comments: (PAT note by Lynwood Hope, PA-C: 68 year old female follows with pulmonology for history of current everyday smoker with COPD/chronic hypoxic respiratory failure on home home oxygen  at 3 L, OSA intolerant of CPAP.  She has recently discovered left upper lobe nodule measuring 1.6 x 1.9 cm with SUV max of 10.3.  She was recommended to undergo bronchoscopy with biopsy.  Follows with cardiology for risk factor management.  She had a nuclear stress test in 12/2022 which was low risk.  Echo 08/2021 showed LVEF 60 to 65%, grade 1 DD, normal RV, no significant  valvular abnormalities.  Follows with neurology for history of seizures.  Per notes, seizure episodes typically involve staring spells, no convulsions.  When last seen by Dr. Gregg, levetiracetam  and lamotrigine  were discontinued due to side effects.  She was continued on Topamax  200 mg twice daily and started on lacosamide  50 mg daily for 2 weeks, with  plan to then increase to 50 mg twice daily.  EEG 01/19/2024 showed, This is an abnormal awake and drowsy EEG due to presence of right parieto-occipital intermittent slowing. 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  Dr. Gregg advised continue current medications.  She will need day of surgery labs and evaluation.  EKG 01/18/24: NSR. Rate 73.  CT chest 02/10/24: IMPRESSION: 1. 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 2. Aortic atherosclerosis (ICD10-I70.0). Coronary artery calcification. 3.  Emphysema (ICD10-J43.9).   Nuclear stress 12/25/2022:   The study is normal. The study is low risk.   No ST deviation was noted.   LV perfusion is normal. There is no evidence of ischemia. There is no evidence of infarction.   Left ventricular function is normal. Nuclear stress EF: 74 %. The left ventricular ejection fraction is hyperdynamic (>65%). End diastolic cavity size is normal. End systolic cavity size is normal.   Prior study not available for comparison.  Normal resting and stress perfusion. No ischemia or infarction EF 74%  TTE 09/03/2021: 1. Left ventricular ejection fraction, by estimation, is 60 to 65%. The  left ventricle has normal function. The left ventricle has no regional  wall motion abnormalities. Left ventricular diastolic parameters are  consistent with Grade I diastolic  dysfunction (impaired relaxation). The average left ventricular global  longitudinal strain is -26.9 %. The global longitudinal strain is normal.  2. Right ventricular systolic function is normal. The right ventricular  size is normal.  3. The mitral valve is normal in structure. No evidence of mitral valve  regurgitation. No evidence of mitral stenosis.  4. The aortic valve is normal in structure. Aortic valve regurgitation is  not visualized. No aortic stenosis is present.  5. The inferior  vena cava is normal in size with greater than 50%  respiratory variability, suggesting right atrial pressure of 3 mmHg.   Comparison(s): 07/15/16 EF 55-60%. PA pressure .   )         Anesthesia Quick Evaluation

## 2024-03-14 NOTE — Progress Notes (Signed)
 Anesthesia Chart Review: Same day workup  68 year old female follows with pulmonology for history of current everyday smoker with COPD/chronic hypoxic respiratory failure on home home oxygen  at 3 L, OSA intolerant of CPAP.  She has recently discovered left upper lobe nodule measuring 1.6 x 1.9 cm with SUV max of 10.3.  She was recommended to undergo bronchoscopy with biopsy.  Follows with cardiology for risk factor management.  She had a nuclear stress test in 12/2022 which was low risk.  Echo 08/2021 showed LVEF 60 to 65%, grade 1 DD, normal RV, no significant valvular abnormalities.  Follows with neurology for history of seizures.  Per notes, seizure episodes typically involve staring spells, no convulsions.  When last seen by Dr. Gregg, levetiracetam  and lamotrigine  were discontinued due to side effects.  She was continued on Topamax  200 mg twice daily and started on lacosamide  50 mg daily for 2 weeks, with plan to then increase to 50 mg twice daily.  EEG 01/19/2024 showed, This is an abnormal awake and drowsy EEG due to presence of right parieto-occipital intermittent slowing. 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  Dr. Gregg advised continue current medications.  She will need day of surgery labs and evaluation.  EKG 01/18/24: NSR. Rate 73.  CT chest 02/10/24: IMPRESSION: 1. 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 2. Aortic atherosclerosis (ICD10-I70.0). Coronary artery calcification. 3.  Emphysema (ICD10-J43.9).    Nuclear stress 12/25/2022:   The study is normal. The study is low risk.   No ST deviation was noted.   LV perfusion is normal. There is no evidence of ischemia. There is no evidence of infarction.   Left ventricular function is normal. Nuclear stress EF: 74 %. The left ventricular ejection fraction is hyperdynamic (>65%). End diastolic cavity size is normal. End systolic  cavity size is normal.   Prior study not available for comparison.   Normal resting and stress perfusion. No ischemia or infarction EF 74%  TTE 09/03/2021: 1. Left ventricular ejection fraction, by estimation, is 60 to 65%. The  left ventricle has normal function. The left ventricle has no regional  wall motion abnormalities. Left ventricular diastolic parameters are  consistent with Grade I diastolic  dysfunction (impaired relaxation). The average left ventricular global  longitudinal strain is -26.9 %. The global longitudinal strain is normal.   2. Right ventricular systolic function is normal. The right ventricular  size is normal.   3. The mitral valve is normal in structure. No evidence of mitral valve  regurgitation. No evidence of mitral stenosis.   4. The aortic valve is normal in structure. Aortic valve regurgitation is  not visualized. No aortic stenosis is present.   5. The inferior vena cava is normal in size with greater than 50%  respiratory variability, suggesting right atrial pressure of 3 mmHg.   Comparison(s): 07/15/16 EF 55-60%. PA pressure .     Lynwood Geofm [facility]/Anesthesiology Phone [phone] 03/14/2024 1:15 PM

## 2024-03-15 ENCOUNTER — Ambulatory Visit (HOSPITAL_BASED_OUTPATIENT_CLINIC_OR_DEPARTMENT_OTHER): Admitting: Physician Assistant

## 2024-03-15 ENCOUNTER — Ambulatory Visit (HOSPITAL_COMMUNITY)

## 2024-03-15 ENCOUNTER — Other Ambulatory Visit: Payer: Self-pay

## 2024-03-15 ENCOUNTER — Ambulatory Visit (HOSPITAL_COMMUNITY)
Admission: RE | Admit: 2024-03-15 | Discharge: 2024-03-15 | Disposition: A | Discharge status: Home / Self Care | Attending: Emergency Medicine | Admitting: Emergency Medicine

## 2024-03-15 ENCOUNTER — Ambulatory Visit: Admitting: Acute Care

## 2024-03-15 ENCOUNTER — Ambulatory Visit (HOSPITAL_COMMUNITY): Admitting: Physician Assistant

## 2024-03-15 ENCOUNTER — Encounter (HOSPITAL_COMMUNITY): Payer: Self-pay | Admitting: Emergency Medicine

## 2024-03-15 ENCOUNTER — Encounter (HOSPITAL_COMMUNITY)
Admission: RE | Disposition: A | Discharge status: Home / Self Care | Payer: Self-pay | Source: Home / Self Care | Attending: Emergency Medicine

## 2024-03-15 DIAGNOSIS — G40909 Epilepsy, unspecified, not intractable, without status epilepticus: Secondary | ICD-10-CM | POA: Insufficient documentation

## 2024-03-15 DIAGNOSIS — J449 Chronic obstructive pulmonary disease, unspecified: Secondary | ICD-10-CM | POA: Diagnosis not present

## 2024-03-15 DIAGNOSIS — Z91148 Patient's other noncompliance with medication regimen for other reason: Secondary | ICD-10-CM | POA: Insufficient documentation

## 2024-03-15 DIAGNOSIS — I5030 Unspecified diastolic (congestive) heart failure: Secondary | ICD-10-CM

## 2024-03-15 DIAGNOSIS — J439 Emphysema, unspecified: Secondary | ICD-10-CM | POA: Diagnosis not present

## 2024-03-15 DIAGNOSIS — I2510 Atherosclerotic heart disease of native coronary artery without angina pectoris: Secondary | ICD-10-CM | POA: Insufficient documentation

## 2024-03-15 DIAGNOSIS — E1122 Type 2 diabetes mellitus with diabetic chronic kidney disease: Secondary | ICD-10-CM | POA: Diagnosis not present

## 2024-03-15 DIAGNOSIS — I130 Hypertensive heart and chronic kidney disease with heart failure and stage 1 through stage 4 chronic kidney disease, or unspecified chronic kidney disease: Secondary | ICD-10-CM

## 2024-03-15 DIAGNOSIS — G4733 Obstructive sleep apnea (adult) (pediatric): Secondary | ICD-10-CM | POA: Diagnosis not present

## 2024-03-15 DIAGNOSIS — R911 Solitary pulmonary nodule: Secondary | ICD-10-CM

## 2024-03-15 DIAGNOSIS — M1990 Unspecified osteoarthritis, unspecified site: Secondary | ICD-10-CM | POA: Insufficient documentation

## 2024-03-15 DIAGNOSIS — G709 Myoneural disorder, unspecified: Secondary | ICD-10-CM | POA: Diagnosis not present

## 2024-03-15 DIAGNOSIS — F17210 Nicotine dependence, cigarettes, uncomplicated: Secondary | ICD-10-CM | POA: Insufficient documentation

## 2024-03-15 DIAGNOSIS — F418 Other specified anxiety disorders: Secondary | ICD-10-CM | POA: Diagnosis not present

## 2024-03-15 DIAGNOSIS — N189 Chronic kidney disease, unspecified: Secondary | ICD-10-CM | POA: Diagnosis not present

## 2024-03-15 DIAGNOSIS — I509 Heart failure, unspecified: Secondary | ICD-10-CM | POA: Insufficient documentation

## 2024-03-15 DIAGNOSIS — K219 Gastro-esophageal reflux disease without esophagitis: Secondary | ICD-10-CM | POA: Insufficient documentation

## 2024-03-15 DIAGNOSIS — Z7982 Long term (current) use of aspirin: Secondary | ICD-10-CM | POA: Diagnosis not present

## 2024-03-15 DIAGNOSIS — J9611 Chronic respiratory failure with hypoxia: Secondary | ICD-10-CM | POA: Diagnosis not present

## 2024-03-15 DIAGNOSIS — C3412 Malignant neoplasm of upper lobe, left bronchus or lung: Secondary | ICD-10-CM | POA: Insufficient documentation

## 2024-03-15 DIAGNOSIS — D631 Anemia in chronic kidney disease: Secondary | ICD-10-CM | POA: Diagnosis not present

## 2024-03-15 DIAGNOSIS — N1831 Chronic kidney disease, stage 3a: Secondary | ICD-10-CM

## 2024-03-15 DIAGNOSIS — Z9981 Dependence on supplemental oxygen: Secondary | ICD-10-CM | POA: Diagnosis not present

## 2024-03-15 HISTORY — PX: VIDEO BRONCHOSCOPY WITH ENDOBRONCHIAL NAVIGATION: SHX6175

## 2024-03-15 LAB — CBC
HCT: 40 % (ref 36.0–46.0)
Hemoglobin: 12.6 g/dL (ref 12.0–15.0)
MCH: 32 pg (ref 26.0–34.0)
MCHC: 31.5 g/dL (ref 30.0–36.0)
MCV: 101.5 fL — ABNORMAL HIGH (ref 80.0–100.0)
Platelets: 208 K/uL (ref 150–400)
RBC: 3.94 MIL/uL (ref 3.87–5.11)
RDW: 14.2 % (ref 11.5–15.5)
WBC: 12.5 K/uL — ABNORMAL HIGH (ref 4.0–10.5)
nRBC: 0 % (ref 0.0–0.2)

## 2024-03-15 LAB — BASIC METABOLIC PANEL WITH GFR
Anion gap: 11 (ref 5–15)
BUN: 17 mg/dL (ref 8–23)
CO2: 20 mmol/L — ABNORMAL LOW (ref 22–32)
Calcium: 9.2 mg/dL (ref 8.9–10.3)
Chloride: 109 mmol/L (ref 98–111)
Creatinine, Ser: 1 mg/dL (ref 0.44–1.00)
GFR, Estimated: >60 mL/min (ref >60)
Glucose, Bld: 113 mg/dL — ABNORMAL HIGH (ref 70–99)
Potassium: 4 mmol/L (ref 3.5–5.1)
Sodium: 140 mmol/L (ref 135–145)

## 2024-03-15 LAB — GLUCOSE, CAPILLARY
Glucose-Capillary: 123 mg/dL — ABNORMAL HIGH (ref 70–99)
Glucose-Capillary: 129 mg/dL — ABNORMAL HIGH (ref 70–99)
Glucose-Capillary: 129 mg/dL — ABNORMAL HIGH (ref 70–99)

## 2024-03-15 SURGERY — VIDEO BRONCHOSCOPY WITH ENDOBRONCHIAL NAVIGATION
Anesthesia: General | Laterality: Left

## 2024-03-15 MED ORDER — PROPOFOL 500 MG/50ML IV EMUL
INTRAVENOUS | Status: DC | PRN
Start: 2024-03-15 — End: 2024-03-15
  Administered 2024-03-15: 125 ug/kg/min via INTRAVENOUS

## 2024-03-15 MED ORDER — ROCURONIUM BROMIDE 10 MG/ML (PF) SYRINGE
PREFILLED_SYRINGE | INTRAVENOUS | Status: DC | PRN
  Administered 2024-03-15: 50 mg via INTRAVENOUS

## 2024-03-15 MED ORDER — PROPOFOL 10 MG/ML IV BOLUS
INTRAVENOUS | Status: DC | PRN
  Administered 2024-03-15: 150 mg via INTRAVENOUS

## 2024-03-15 MED ORDER — PHENYLEPHRINE HCL-NACL 20-0.9 MG/250ML-% IV SOLN
INTRAVENOUS | Status: DC | PRN
  Administered 2024-03-15: 25 ug/min via INTRAVENOUS

## 2024-03-15 MED ORDER — PHENYLEPHRINE 80 MCG/ML (10ML) SYRINGE FOR IV PUSH (FOR BLOOD PRESSURE SUPPORT)
PREFILLED_SYRINGE | INTRAVENOUS | Status: DC | PRN
  Administered 2024-03-15 (×3): 160 ug via INTRAVENOUS

## 2024-03-15 MED ORDER — SUGAMMADEX SODIUM 200 MG/2ML IV SOLN
INTRAVENOUS | Status: DC | PRN
  Administered 2024-03-15: 200 mg via INTRAVENOUS

## 2024-03-15 MED ORDER — ONDANSETRON HCL 4 MG/2ML IJ SOLN
INTRAMUSCULAR | Status: DC | PRN
  Administered 2024-03-15: 4 mg via INTRAVENOUS

## 2024-03-15 MED ORDER — ALBUTEROL SULFATE (2.5 MG/3ML) 0.083% IN NEBU
2.5000 mg | INHALATION_SOLUTION | Freq: Once | RESPIRATORY_TRACT | Status: AC
  Administered 2024-03-15: 2.5 mg via RESPIRATORY_TRACT

## 2024-03-15 MED ORDER — CHLORHEXIDINE GLUCONATE 0.12 % MT SOLN
OROMUCOSAL | Status: AC
  Filled 2024-03-15: qty 15

## 2024-03-15 MED ORDER — VITAMIN D3 50 MCG (2000 UT) PO CAPS
2000.0000 [IU] | ORAL_CAPSULE | Freq: Every day | ORAL | Status: AC
Start: 2024-03-15 — End: ?

## 2024-03-15 MED ORDER — GUAIFENESIN ER 600 MG PO TB12: 1200.0000 mg | ORAL_TABLET | Freq: Two times a day (BID) | ORAL | Status: AC | PRN

## 2024-03-15 MED ORDER — DEXAMETHASONE SODIUM PHOSPHATE 10 MG/ML IJ SOLN
INTRAMUSCULAR | Status: DC | PRN
  Administered 2024-03-15: 10 mg via INTRAVENOUS

## 2024-03-15 MED ORDER — GABAPENTIN 100 MG PO CAPS: 200.0000 mg | ORAL_CAPSULE | Freq: Three times a day (TID) | ORAL | Status: AC

## 2024-03-15 MED ORDER — LACTATED RINGERS IV SOLN: INTRAVENOUS | Status: DC

## 2024-03-15 SURGICAL SUPPLY — 37 items
ADAPTER BRONCHOSCOPE OLYMPUS (ADAPTER) ×1 IMPLANT
ADAPTER VALVE BIOPSY EBUS (MISCELLANEOUS) IMPLANT
BAG COUNTER SPONGE SURGICOUNT (BAG) ×1 IMPLANT
BRUSH CYTOL CELLEBRITY 1.5X140 (MISCELLANEOUS) ×1 IMPLANT
BRUSH SUPERTRAX BIOPSY (INSTRUMENTS) IMPLANT
BRUSH SUPERTRAX NDL-TIP CYTO (INSTRUMENTS) ×1 IMPLANT
CANISTER SUCTION 3000ML PPV (SUCTIONS) ×1 IMPLANT
CNTNR URN SCR LID CUP LEK RST (MISCELLANEOUS) ×1 IMPLANT
COVER BACK TABLE 60X90IN (DRAPES) ×1 IMPLANT
FILTER STRAW FLUID ASPIR (MISCELLANEOUS) IMPLANT
FORCEPS BIOP 1.5 SINGLE USE (MISCELLANEOUS) ×1 IMPLANT
FORCEPS BIOP SUPERTRX PREMAR (INSTRUMENTS) ×1 IMPLANT
GAUZE SPONGE 4X4 12PLY STRL (GAUZE/BANDAGES/DRESSINGS) ×1 IMPLANT
GLOVE BIO SURGEON STRL SZ7.5 (GLOVE) ×2 IMPLANT
GOWN STRL REUS W/ TWL LRG LVL3 (GOWN DISPOSABLE) ×2 IMPLANT
KIT CLEAN ENDO COMPLIANCE (KITS) ×1 IMPLANT
KIT LOCATABLE GUIDE (CANNULA) IMPLANT
KIT MARKER FIDUCIAL DELIVERY (KITS) IMPLANT
KIT TURNOVER KIT B (KITS) ×1 IMPLANT
MARKER SKIN DUAL TIP RULER LAB (MISCELLANEOUS) ×1 IMPLANT
NDL SUPERTRX PREMARK BIOPSY (NEEDLE) ×1 IMPLANT
NEEDLE SUPERTRX PREMARK BIOPSY (NEEDLE) ×1 IMPLANT
NS IRRIG 1000ML POUR BTL (IV SOLUTION) ×1 IMPLANT
OIL SILICONE PENTAX (PARTS (SERVICE/REPAIRS)) ×1 IMPLANT
PAD ARMBOARD POSITIONER FOAM (MISCELLANEOUS) ×2 IMPLANT
PATCHES PATIENT (LABEL) ×3 IMPLANT
SYR 20ML ECCENTRIC (SYRINGE) ×1 IMPLANT
SYR 20ML LL LF (SYRINGE) ×1 IMPLANT
SYR 50ML SLIP (SYRINGE) ×1 IMPLANT
TOWEL GREEN STERILE FF (TOWEL DISPOSABLE) ×1 IMPLANT
TRAP SPECIMEN MUCUS 40CC (MISCELLANEOUS) IMPLANT
TUBE CONNECTING 20X1/4 (TUBING) ×1 IMPLANT
UNDERPAD 30X36 HEAVY ABSORB (UNDERPADS AND DIAPERS) ×1 IMPLANT
VALVE BIOPSY SINGLE USE (MISCELLANEOUS) ×1 IMPLANT
VALVE SUCTION BRONCHIO DISP (MISCELLANEOUS) ×1 IMPLANT
WATER STERILE IRR 1000ML POUR (IV SOLUTION) ×1 IMPLANT
superlock fiducial marker IMPLANT

## 2024-03-15 NOTE — Op Note (Signed)
 Procedure Note  Patient: [name]  [employer] Cios mobile C-arm was utilized to identify and biopsy left upper lobe nodule.  Needle-in-lesion was confirmed using real-time Cios imaging, and images were uploaded to PACS.   Lamar Chris, MD, PhD 03/15/2024, 12:06 PM [facility] [phone] or if no answer before 7:00PM call [phone] For any issues after 7:00PM please call eLink [phone]

## 2024-03-15 NOTE — Op Note (Signed)
 Video Bronchoscopy with Robotic Assisted Bronchoscopic Navigation   Date of Operation: 03/15/2024   Pre-op Diagnosis: Left upper lobe nodule  Post-op Diagnosis: Same  Surgeon: Lamar Chris  Assistants: None  Anesthesia: General endotracheal anesthesia  Operation: Flexible video fiberoptic bronchoscopy with robotic assistance and biopsies.  Estimated Blood Loss: Minimal  Complications: None  Indications and History: [name] is a 68 y.o. female with history of tobacco use and COPD.  She has a left upper lobe pulmonary nodule identified on CT chest.  Recommendation made to achieve a tissue diagnosis via robotic assisted navigational bronchoscopy.  The risks, benefits, complications, treatment options and expected outcomes were discussed with the patient.  The possibilities of pneumothorax, pneumonia, reaction to medication, pulmonary aspiration, perforation of a viscus, bleeding, failure to diagnose a condition and creating a complication requiring transfusion or operation were discussed with the patient who freely signed the consent.    Description of Procedure: The patient was seen in the Preoperative Area, was examined and was deemed appropriate to proceed.  The patient was taken to [facility] Endoscopy room 3, identified as [name] and the procedure verified as Flexible Video Fiberoptic Bronchoscopy.  A Time Out was held and the above information confirmed.   Prior to the date of the procedure a high-resolution CT scan of the chest was performed. Utilizing ION software program a virtual tracheobronchial tree was generated to allow the creation of distinct navigation pathways to the patient's parenchymal abnormalities. After being taken to the operating room general anesthesia was initiated and the patient  was orally intubated. The video fiberoptic bronchoscope was introduced via the endotracheal tube and a general inspection was performed which showed normal right and left lung anatomy.  Aspiration of the bilateral mainstems was completed to remove any remaining secretions. Robotic catheter inserted into patient's endotracheal tube.   Target #1 left upper lobe nodule: The distinct navigation pathways prepared prior to this procedure were then utilized to navigate to patient's lesion identified on CT scan. The robotic catheter was secured into place and the vision probe was withdrawn.  Lesion location was approximated using fluoroscopy.  Local registration and targeting was performed using [employer] Cios mobile C-arm three-dimensional imaging. Under fluoroscopic guidance transbronchial needle brushings, transbronchial needle biopsies, and transbronchial forceps biopsies were performed to be sent for cytology and pathology.  A single needle biopsy was obtained and placed into sterile saline to go for microbiology.  Needle-in-lesion was confirmed using Cios mobile C-arm.  Under fluoroscopic guidance a single fiducial marker was placed adjacent to the nodule.   At the end of the procedure a general airway inspection was performed and there was no evidence of active bleeding. The bronchoscope was removed.  The patient tolerated the procedure well. There was no significant blood loss and there were no obvious complications. A post-procedural chest x-ray is pending.  Samples Target #1: 1. Transbronchial needle brushings from left upper lobe nodule 2. Transbronchial Wang needle biopsies from left upper lobe nodule 3. Transbronchial forceps biopsies from left upper lobe nodule   Plans:  The patient will be discharged from the PACU to home when recovered from anesthesia and after chest x-ray is reviewed. We will review the cytology, pathology and microbiology results with the patient when they become available. Outpatient followup will be with CANDIE Lites, NP and Dr Darlean.   Lamar Chris, MD, PhD 03/15/2024, 12:06 PM [facility] [phone] or if no answer  before 7:00PM call [phone] For any issues  after 7:00PM please call eLink [phone]

## 2024-03-15 NOTE — Interval H&P Note (Signed)
 History and Physical Interval Note:  03/15/2024 9:27 AM  [name]  has presented today for surgery, with the diagnosis of lung nodule.  The various methods of treatment have been discussed with the patient and family. After consideration of risks, benefits and other options for treatment, the patient has consented to  Procedure(s): VIDEO BRONCHOSCOPY WITH ENDOBRONCHIAL NAVIGATION (Left) as a surgical intervention.  The patient's history has been reviewed, patient examined, no change in status, stable for surgery.  I have reviewed the patient's chart and labs.  Questions were answered to the patient's satisfaction.     Lamar GORMAN Chris

## 2024-03-15 NOTE — Progress Notes (Signed)
 Per pt she used to take Keppra  for seizures but stopped a couple of months ago due to side effects. Pt denies having any seizure episodes since then. She is not taking any other seizure meds. Dr. Keneth and Dr. Niels are aware.

## 2024-03-15 NOTE — Anesthesia Procedure Notes (Signed)
 Procedure Name: Intubation Date/Time: 03/15/2024 11:25 AM  Performed by: Elby Raelene SAUNDERS, CRNAPre-anesthesia Checklist: Patient identified, Emergency Drugs available, Suction available and Patient being monitored Patient Re-evaluated:Patient Re-evaluated prior to induction Oxygen  Delivery Method: Circle system utilized Preoxygenation: Pre-oxygenation with 100% oxygen  Induction Type: IV induction Ventilation: Mask ventilation without difficulty Laryngoscope Size: Miller and 2 Grade View: Grade I Tube type: Oral Tube size: 8.5 mm Number of attempts: 1 Airway Equipment and Method: Stylet and Oral airway Placement Confirmation: ETT inserted through vocal cords under direct vision, positive ETCO2 and breath sounds checked- equal and bilateral Secured at: 22 cm Tube secured with: Tape Dental Injury: Teeth and Oropharynx as per pre-operative assessment

## 2024-03-15 NOTE — Transfer of Care (Signed)
 Immediate Anesthesia Transfer of Care Note  Patient: [name]  Procedure(s) Performed: VIDEO BRONCHOSCOPY WITH ENDOBRONCHIAL NAVIGATION (Left)  Patient Location: PACU  Anesthesia Type:General  Level of Consciousness: drowsy  Airway & Oxygen  Therapy: Patient Spontanous Breathing and Patient connected to face mask oxygen   Post-op Assessment: Report given to RN and Post -op Vital signs reviewed and stable  Post vital signs: Reviewed and stable  Last Vitals:  Vitals Value Taken Time  BP 96/61 03/15/24 12:15  Temp 36.9 C 03/15/24 12:14  Pulse 96 03/15/24 12:20  Resp 26 03/15/24 12:20  SpO2 96 % 03/15/24 12:20  Vitals shown include unfiled device data.  Last Pain:  Vitals:   03/15/24 0940  TempSrc:   PainSc: 8          Complications: No notable events documented.

## 2024-03-15 NOTE — Discharge Instructions (Signed)
 Flexible Bronchoscopy, Care After This sheet gives you information about how to care for yourself after your test. Your doctor may also give you more specific instructions. If you have problems or questions, contact your doctor. Follow these instructions at home: Eating and drinking When you are wide awake, your numbness is gone and your cough and gag reflexes have come back, you may: Start eating only soft foods. Slowly drink liquids. Six hours after the test, go back to your normal diet. Driving Do not drive for 24 hours if you were given a medicine to help you relax (sedative). Do not drive or use heavy machinery while taking prescription pain medicine. General instructions Take over-the-counter and prescription medicines only as told by your doctor. Return to your normal activities as told. Ask what activities are safe for you. Do not use any products that have nicotine or tobacco in them. This includes cigarettes and e-cigarettes. If you need help quitting, ask your doctor. Keep all follow-up visits as told by your doctor. This is important. It is very important if you had a tissue sample (biopsy) taken. Get help right away if: You have shortness of breath that gets worse. You get light-headed. You feel like you are going to pass out (faint). You have chest pain. You cough up: More than a little blood. More blood than before. Summary Do not use cigarettes. Do not use e-cigarettes. Seek care in the Emergency Department right away if you have chest pain or shortness of breath. Call or MyChart Message our office for any questions or problems at [phone].    This information is not intended to replace advice given to you by your health care provider. Make sure you discuss any questions you have with your health care provider.

## 2024-03-16 LAB — ACID FAST SMEAR (AFB, MYCOBACTERIA): Acid Fast Smear: NEGATIVE

## 2024-03-16 NOTE — Anesthesia Postprocedure Evaluation (Signed)
 Anesthesia Post Note  Patient: [name]  Procedure(s) Performed: VIDEO BRONCHOSCOPY WITH ENDOBRONCHIAL NAVIGATION (Left)     Patient location during evaluation: PACU Anesthesia Type: General Level of consciousness: awake and alert Pain management: pain level controlled Vital Signs Assessment: post-procedure vital signs reviewed and stable Respiratory status: spontaneous breathing, nonlabored ventilation, respiratory function stable and patient connected to nasal cannula oxygen  Cardiovascular status: blood pressure returned to baseline and stable Postop Assessment: no apparent nausea or vomiting Anesthetic complications: no   No notable events documented.  Last Vitals:  Vitals:   03/15/24 1245 03/15/24 1300  BP: (!) 107/41 102/60  Pulse: 93 91  Resp: 18 (!) 22  Temp:  36.7 C  SpO2: 95% 94%    Last Pain:  Vitals:   03/15/24 0940  TempSrc:   PainSc: 8                  Ruffin Lada L Suzane Vanderweide

## 2024-03-17 ENCOUNTER — Encounter (HOSPITAL_COMMUNITY): Payer: Self-pay | Admitting: Emergency Medicine

## 2024-03-20 LAB — AEROBIC/ANAEROBIC CULTURE W GRAM STAIN (SURGICAL/DEEP WOUND)
Culture: NO GROWTH
Gram Stain: NONE SEEN

## 2024-03-21 LAB — CYTOLOGY - NON PAP

## 2024-03-22 LAB — FUNGUS STAIN

## 2024-03-22 LAB — FUNGAL STAIN REFLEX

## 2024-03-23 ENCOUNTER — Ambulatory Visit: Admitting: Acute Care

## 2024-03-23 ENCOUNTER — Other Ambulatory Visit (HOSPITAL_COMMUNITY): Payer: Self-pay | Admitting: Occupational Therapy

## 2024-03-23 ENCOUNTER — Encounter: Payer: Self-pay | Admitting: Acute Care

## 2024-03-23 VITALS — BP 94/68 | HR 118 | Temp 97.5°F | Ht 60.0 in | Wt 192.0 lb

## 2024-03-23 DIAGNOSIS — C3412 Malignant neoplasm of upper lobe, left bronchus or lung: Secondary | ICD-10-CM

## 2024-03-23 DIAGNOSIS — F17210 Nicotine dependence, cigarettes, uncomplicated: Secondary | ICD-10-CM

## 2024-03-23 DIAGNOSIS — C3490 Malignant neoplasm of unspecified part of unspecified bronchus or lung: Secondary | ICD-10-CM | POA: Diagnosis not present

## 2024-03-23 DIAGNOSIS — R1312 Dysphagia, oropharyngeal phase: Secondary | ICD-10-CM

## 2024-03-23 DIAGNOSIS — K219 Gastro-esophageal reflux disease without esophagitis: Secondary | ICD-10-CM

## 2024-03-23 NOTE — Patient Instructions (Addendum)
 It is good to see you today.  I am glad you have done well after your procedure. We have reviewed your biopsy results. 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 I have referred you to radiation oncology for treatment. You will get a call to get this scheduled. I have ordered an MRI Brain to be done at [facility] to complete your staging. They will take great care of you. Call if you need us . Please contact office for sooner follow up if symptoms do not improve or worsen or seek emergency care   Please work on quitting smoking You can receive free nicotine  replacement therapy (patches, gum, or mints) by calling 1-800-QUIT NOW. Please call so we can get you on the path to becoming a non-smoker. I know it is hard, but you can do this!  Hypnosis for smoking cessation  [employer] [phone]  Acupuncture for smoking cessation  [employer] [phone]

## 2024-03-23 NOTE — Progress Notes (Signed)
 History of Present Illness [name] is a 68 y.o. female current every day smoker ( 23.1 pack year history) referred for by Dr. Darlean for abnormal chest imaging. She will be followed by Dr. Shelah.    03/23/2024 Discussed the use of AI scribe software for clinical note transcription with the patient, who gave verbal consent to proceed.  History of Present Illness [name] is a 68 year old female current every day smoker who presents for post bronchoscopy with biopsy follow up.    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  She states she has done well since the procedure.  She denies any  bleeding, fever, infection, or discolored secretions  post-procedure, and she tolerated the anesthesia well.  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 We discussed the gold standard is surgery to remove the nodule. She had borderline PFT's in 2023. She continues to smoke. She is concerned about the option of surgery due to her mother's experience with lung cancer surgery and subsequent stroke.I explained that the surgery has become more advanced with shorter recovery time and smaller incisions. She still does not want to be referred to surgery.  We discussed radiation  as an option for treatment.  She is interested in learning more about this as one of her treatment options.  I will refer her to radiation oncology to be evaluated.  PET scan was done as part of her workup by pulmonary however I will order MRI brain to complete staging.  Patient is still smoking.  She has a documented 23.1-pack-year smoking history.  She has made many attempts in the past but has been unable to quit.  She has good family support and I have provided her with resources for free nicotine  replacement therapy.  We also discussed behavioral changes she can make that we will keep her hands busy and  her mind busy. I spent 3-4 minutes counseling patient on  steps to stop use of tobacco products. I have provided patient with information on receiving free nicotine  replacement therapy, and contact numbers for hypnosis for smoking cessation as well as acupuncture for smoking cessation.      Test Results: Cytology 03/15/2024 A. LUNG, LUL, FINE NEEDLE ASPIRATION  BIOPSY:  - Positive for malignancy. Squamous cell carcinoma (see comment).  B. LUNG, LUL, BRUSHING:  - No malignant cells identified.  - Reactive bronchial cells.   Micro 03/15/2024 >>Anaerobic/ Aerobic No Growth AFB>> Negative Negative for fungal  CT Chest with Contrast 02/09/2024 Spiculated left upper lobe nodule, indicative of stage IA primary bronchogenic carcinoma. 2. Aortic atherosclerosis (ICD10-I70.0). Coronary artery calcification. 3.  Emphysema (ICD10-J43.9).  PET scan 02/25/2024 Left upper lobe spiculated mass on prior examination today appears slightly more cavitary. This has significant abnormal uptake worrisome for neoplasm.   No additional areas of abnormal uptake identified at this time.   The mild asymmetric uptake along the ascending colon could be a variant of bowel distribution. Please correlate with any history and symptoms.   Colonic diverticula. Nonobstructing left-sided renal stone. Emphysematous lung changes. Coronary artery calcifications.  02/10/2024 CT chest Spiculated left upper lobe nodule, indicative of stage IA primary bronchogenic carcinoma. 2. Aortic atherosclerosis (ICD10-I70.0). Coronary artery calcification. 3.  Emphysema (ICD10-J43.9).          Latest Ref Rng & Units 03/15/2024    8:57 AM 02/06/2023  8:09 AM 02/05/2023    5:18 AM  CBC  WBC 4.0 - 10.5 K/uL 12.5  9.7  18.9   Hemoglobin 12.0 - 15.0 g/dL 87.3  9.4  9.2   Hematocrit 36.0 - 46.0 % 40.0  30.7  29.6   Platelets 150 - 400 K/uL 208  250  225        Latest Ref Rng & Units 03/15/2024    8:57 AM 02/06/2023    8:09 AM  02/05/2023    5:18 AM  BMP  Glucose 70 - 99 mg/dL 886  95  94   BUN 8 - 23 mg/dL 17  21  19    Creatinine 0.44 - 1.00 mg/dL 8.99  8.88  9.15   Sodium 135 - 145 mmol/L 140  136  136   Potassium 3.5 - 5.1 mmol/L 4.0  4.0  3.8   Chloride 98 - 111 mmol/L 109  107  109   CO2 22 - 32 mmol/L 20  22  19    Calcium  8.9 - 10.3 mg/dL 9.2  8.6  8.4     BNP    Component Value Date/Time   BNP 14.4 01/30/2012 1700    ProBNP    Component Value Date/Time   PROBNP 16.0 11/18/2021 0938    PFT    Component Value Date/Time   FEV1PRE 0.99 09/27/2021 1009   FEV1POST 1.05 09/27/2021 1009   FVCPRE 1.71 09/27/2021 1009   FVCPOST 1.82 09/27/2021 1009   DLCOUNC 9.41 09/27/2021 1009   PREFEV1FVCRT 58 09/27/2021 1009   PSTFEV1FVCRT 58 09/27/2021 1009    DG Chest Port 1 View Result Date: 03/15/2024 EXAM: 1 VIEW XRAY OF THE CHEST 03/15/2024 12:50:50 PM COMPARISON: 01/27/2024 CLINICAL HISTORY: S/P bronchoscopy with biopsy FINDINGS: LUNGS AND PLEURA: No focal pulmonary opacity. No pulmonary edema. No pleural effusion. No pneumothorax. Interstitial thickening and coarsening are nonspecific. The left upper lobe nodule is likely obscured by overlying EKG lead. HEART AND MEDIASTINUM: No acute abnormality of the cardiac and mediastinal silhouettes. Atherosclerosis in the transverse aorta. BONES AND SOFT TISSUES: No acute osseous abnormality. IMPRESSION: 1. No acute findings. 2. Left upper lobe nodule likely obscured by overlying EKG lead. Electronically signed by: Rockey Kilts MD 03/15/2024 01:55 PM EDT RP Workstation: [id number]   DG C-ARM BRONCHOSCOPY Result Date: 03/15/2024 C-ARM BRONCHOSCOPY: Fluoroscopy was utilized by the requesting physician.  No radiographic interpretation.   NM PET Image Initial (PI) Skull Base To Thigh Result Date: 02/26/2024 CLINICAL DATA:  Initial treatment strategy for lung nodule. EXAM: NUCLEAR MEDICINE PET SKULL BASE TO THIGH TECHNIQUE: 10.21 mCi F-18 FDG was injected intravenously.  Full-ring PET imaging was performed from the skull base to thigh after the radiotracer. CT data was obtained and used for attenuation correction and anatomic localization. Fasting blood glucose: 112 mg/dl COMPARISON:  Chest CT without contrast 02/10/2024 FINDINGS: Mediastinal blood pool activity: SUV max 2.0 Liver activity: SUV max 2.7 NECK: No specific abnormal uptake seen in the neck including along lymph node change of the submandibular, posterior triangle or internal jugular regions. Mild paraspinal muscle uptake on the left side. Near symmetric uptake of the visualized intracranial compartment. Incidental CT findings: The parotid glands, submandibular glands thyroid  gland unremarkable. Visualized portions of the paranasal sinuses and mastoid air cells are clear. Nasal septal deviation. CHEST: Previous study demonstrated a spiculated left upper lobe mass. Today the lesion is more cavitary on image 48 of the CT scan. Dimension today of 19 x 18 mm on image 40 of series 202.  Previously measured at 19 x 16 mm. This has abnormal uptake of maximum SUV of 10.3. No additional areas of abnormal lung uptake. No abnormal uptake above blood pool in the axillary regions, hilum or mediastinum. Incidental CT findings: Emphysematous lung changes identified. There is some scattered areas of scarring and fibrotic changes. No consolidation, pneumothorax or effusion. Please correlate with prior CT scan. Scattered vascular calcifications. Patulous esophagus. Coronary artery calcifications are seen. Please correlate for other coronary risk factors. No pericardial effusion. ABDOMEN/PELVIS: There is physiologic distribution of radiotracer along the parenchymal organs and renal collecting systems. There is some slight asymmetric uptake along the ascending colon. This could be a variant of physiologic distribution. Please correlate with clinical history and any history of previous colonic evaluation and screening. Incidental CT findings:  Grossly, the liver, spleen, adrenal glands are unremarkable. Diffuse fatty atrophy of the pancreas. Gallbladder is mildly distended. Nonobstructing left-sided renal stone. No ureteral stone. Large bowel has a normal course and caliber. Scattered colonic stool. Sigmoid colon diverticula. Normal appendix. Stomach and small bowel are nondilated. Scattered vascular calcifications. Normal caliber aorta and IVC. SKELETON: No abnormal uptake along the visualized osseous structures. Incidental CT findings: Scattered degenerative changes. IMPRESSION: Left upper lobe spiculated mass on prior examination today appears slightly more cavitary. This has significant abnormal uptake worrisome for neoplasm. No additional areas of abnormal uptake identified at this time. The mild asymmetric uptake along the ascending colon could be a variant of bowel distribution. Please correlate with any history and symptoms. Colonic diverticula. Nonobstructing left-sided renal stone. Emphysematous lung changes. Coronary artery calcifications. Electronically Signed   By: Ranell Bring M.D.   On: 02/26/2024 14:51     Past medical hx Past Medical History:  Diagnosis Date   Allergic rhinitis    Anxiety    Arthritis    hands and legs and feet (04/27/2012)   Asthma    Cancer (HCC) 2005   rt arm mole rem cancer   CHF (congestive heart failure) (HCC)    Chronic bronchitis (HCC)    keep it all the time (04/27/2012)   Chronic kidney disease 01/27/2024   recurrent UTIs   Chronic kidney disease (CKD)    Stage III   COPD (chronic obstructive pulmonary disease) (HCC)    COVID-19 virus infection    03-2021   Depression    PT DENIES   Diverticulosis    DJD (degenerative joint disease)    Emphysema    Epilepsy (HCC)    Esophageal stricture    Exertional dyspnea    GERD (gastroesophageal reflux disease)    Glaucoma    bilateral   H/O blood clots    Hx Right leg, clot in left leg in 2024   Heart failure (HCC)    History of  COVID-19 03/2021   History of kidney stones    surgery   History of nuclear stress test    Myoview  12/25/22: EF 74, no ischemia or infarction, low risk   Hyperlipidemia    Hypertension    IBS (irritable bowel syndrome)    Migraine    Neck pain    Cervical disc   OSA on CPAP    patient does not wear CPAP, uses oxygen  3L via [place] qhs and prn per pt on 03/11/24.         6-7 yrs; pt wears CPAP but has not been able to use it lately due to sinus issues   Oxygen  deficiency    USES 3 LITERS 02 CONTINOUS  Pneumonia 2012; 04/27/2012   Recurrent upper respiratory infection (URI)    Right leg DVT (HCC) 1982   groin   Seizures (HCC) 06/2023   last seizure 12/2023   Sensorineural hearing loss (SNHL) of both ears 12/10/2023   does not using hearing aids - need to f/u with MD   Sleep apnea    Type II diabetes mellitus (HCC)    No meds since weight loss, does not check blood sugar   UTI (urinary tract infection)    2 weeks ago   Vertigo 10/24/2014     Social History   Tobacco Use   Smoking status: Every Day    Current packs/day: 0.25    Average packs/day: 0.5 packs/day for 47.0 years (23.1 ttl pk-yrs)    Types: Cigarettes    Start date: 08/12/1975    Last attempt to quit: 12/30/2020   Smokeless tobacco: Never   Tobacco comments:    1/2 pack a day 03/23/2024 mh  Vaping Use   Vaping status: Never Used  Substance Use Topics   Alcohol use: No    Alcohol/week: 0.0 standard drinks of alcohol   Drug use: No    Ms.Kamath reports that she has been smoking cigarettes. She started smoking about 48 years ago. She has a 23.1 pack-year smoking history. She has never used smokeless tobacco. She reports that she does not drink alcohol and does not use drugs.   Tobacco Cessation: Ready to quit: Not Answered Counseling given: Not Answered Tobacco comments: 1/2 pack a day 03/23/2024 mh Current every day smoker , I spent 3-4 minutes counseling patient on  steps to stop use of tobacco products. I have  provided patient with information on receiving free nicotine  replacement therapy, and contact numbers for hypnosis for smoking cessation as well as acupuncture for smoking cessation.   Past surgical hx, Family hx, Social hx all reviewed.  Current Outpatient Medications on File Prior to Visit  Medication Sig   acetaminophen  (TYLENOL ) 500 MG tablet Take 1,000 mg by mouth every 6 (six) hours as needed for mild pain (pain score 1-3), moderate pain (pain score 4-6) or headache.   albuterol  (PROAIR  HFA) 108 (90 Base) MCG/ACT inhaler Inhale 2 puffs into the lungs every 6 (six) hours as needed for shortness of breath or wheezing.   albuterol  (PROVENTIL ) (2.5 MG/3ML) 0.083% nebulizer solution USE 1 VIAL IN NEBULIZER EVERY 6 HOURS AS NEEDED FOR SHORTNESS OF BREATH (Patient taking differently: Take 2.5 mg by nebulization every 6 (six) hours as needed for wheezing or shortness of breath.)   ALPRAZolam  (XANAX ) 0.25 MG tablet Take 0.25 mg by mouth 2 (two) times daily.   Cholecalciferol  (VITAMIN D3) 50 MCG (2000 UT) capsule Take 1 capsule (2,000 Units total) by mouth daily.   famotidine  (PEPCID ) 40 MG tablet Take 40 mg by mouth at bedtime.   ferrous sulfate  325 (65 FE) MG tablet Take 325 mg by mouth daily with breakfast.   fluticasone  (FLONASE ) 50 MCG/ACT nasal spray Place 1 spray into both nostrils 2 (two) times daily.   gabapentin  (NEURONTIN ) 100 MG capsule Take 2 capsules (200 mg total) by mouth 3 (three) times daily.   guaiFENesin  (MUCINEX ) 600 MG 12 hr tablet Take 2 tablets (1,200 mg total) by mouth 2 (two) times daily as needed for cough or to loosen phlegm.   ipratropium-albuterol  (DUONEB) 0.5-2.5 (3) MG/3ML SOLN Take 3 mLs by nebulization every 6 (six) hours as needed (Wheezing/sob).   levETIRAcetam  (KEPPRA ) 250 MG tablet Take 250 mg by  mouth 2 (two) times daily.   loratadine  (ALLERGY RELIEF) 10 MG tablet Take 1 tablet (10 mg total) by mouth daily.   methocarbamol (ROBAXIN) 750 MG tablet Take 750 mg by  mouth at bedtime.   metoCLOPramide  (REGLAN ) 5 MG tablet Take 1 tablet (5 mg total) by mouth every 12 (twelve) hours as needed for up to 2 doses for nausea. Take 30-45 minutes before evening and AM doses of bowel preparation solution.   montelukast  (SINGULAIR ) 10 MG tablet TAKE ONE TABLET BY MOUTH AT BEDTIME   OXYGEN  Inhale 3 L into the lungs continuous. [address] [place] PRN and at bedtime   pantoprazole  (PROTONIX ) 40 MG tablet Take 1 tablet (40 mg total) by mouth 2 (two) times daily. **PLEASE CALL OFFICE TO SCHEDULE FOLLOW UP   QUEtiapine  (SEROQUEL ) 100 MG tablet TAKE ONE TABLET BY MOUTH AT BEDTIME   simvastatin  (ZOCOR ) 40 MG tablet TAKE ONE TABLET BY MOUTH AT BEDTIME (Patient taking differently: Take 40 mg by mouth daily at 6 PM.)   topiramate  (TOPAMAX ) 100 MG tablet Take 2 tablets (200 mg total) by mouth 2 (two) times daily.   TRELEGY ELLIPTA  200-62.5-25 MCG/ACT AEPB Inhale 1 puff into the lungs daily.   triamcinolone  cream (KENALOG ) 0.1 % Apply 1 Application topically daily as needed (irritation).   No current facility-administered medications on file prior to visit.     Allergies  Allergen Reactions   Amoxicillin Anaphylaxis    Breakout, mouth swells   Azithromycin Swelling and Rash    swelling all over, hives, thrush   Cefdinir      Throat swelling after 3rd dose   Chantix  [Varenicline ] Other (See Comments)    bad dreams, difficulty breathing   Clindamycin  Anaphylaxis   Doxycycline  Hyclate Anaphylaxis and Rash    swelling   Levofloxacin  Anaphylaxis   Paxlovid  [Nirmatrelvir -Ritonavir ] Swelling    Throat swelling   Penicillins Anaphylaxis, Shortness Of Breath and Rash    ALMOST DIED, ended up in hospital    Sulfonamide Derivatives Shortness Of Breath, Swelling and Rash    break out from head to toe   Toradol  [Ketorolac  Tromethamine ] Other (See Comments)    Seizure    Clarithromycin Hives    swelling   Nortriptyline Swelling, Rash and Other (See Comments)    Per patient had  kidney problems on this medication, could not use bathroom (urinary retention)   Moxifloxacin     Codeine  Nausea And Vomiting   Fluticasone -Salmeterol Other (See Comments)    REACTION: ulcers in mouth   Triptans Rash    Mouth breaks out and start itching    Review Of Systems:  Constitutional:   No  weight loss, night sweats,  Fevers, chills, + fatigue, or  lassitude.  HEENT:   No headaches,  Difficulty swallowing,  Tooth/dental problems, or  Sore throat,                No sneezing, itching, ear ache, nasal congestion, post nasal drip,   CV:  No chest pain, some chest soreness, Orthopnea, PND, swelling in lower extremities, anasarca, dizziness, palpitations, syncope.   GI  No heartburn, indigestion, abdominal pain, nausea, vomiting, diarrhea, change in bowel habits, loss of appetite, bloody stools.   Resp: + shortness of breath with exertion less at rest.  + Baseline excess mucus, + baseline productive cough,  No non-productive cough,  No coughing up of blood.  No change in color of mucus.  + Intermittent wheezing.  No chest wall deformity  Skin: no rash or lesions.  GU: no dysuria, change in color of urine, no urgency or frequency.  No flank pain, no hematuria   MS:  No joint pain or swelling.  + decreased range of motion.  + back pain.  Psych:  No change in mood or affect. No depression or anxiety.  No memory loss.   Vital Signs BP 94/68   Pulse (!) 118   Temp (!) 97.5 F (36.4 C)   Ht 5' (1.524 m)   Wt 192 lb (87.1 kg)   SpO2 97% Comment: RA  BMI 37.50 kg/m    Physical Exam:  General- No distress,  A&Ox3, pleasant and appropriate ENT: No sinus tenderness, TM clear, pale nasal mucosa, no oral exudate,no post nasal drip, no LAN Cardiac: S1, S2, regular rate and rhythm, no murmur Chest: + wheeze/ No rales/ dullness; no accessory muscle use, no nasal flaring, no sternal retractions Abd.: Soft Non-tender, ND, BS +, Body mass index is 37.5 kg/m.  Ext: No clubbing  cyanosis, edema, no obvious deformities Neuro: Physical deconditioning, moving all extremities x 4, alert and oriented x 3 Skin: No rashes, warm and dry, no obvious skin lesions Psych: normal mood and behavior, appropriately concerned about new diagnosis   Assessment/Plan  Assessment and Plan Assessment & Plan New diagnosis left upper lobe squamous cell carcinoma Confirmed by tissue diagnosis via biopsy - Refer to radiation oncology for SBRT. - Order MRI of the brain to rule complete staging  Tobacco use disorder Ongoing tobacco use increases risk for lung cancer recurrence. Advised to quit smoking to improve treatment outcomes. - Discuss potential use of hypnosis or other cessation aids. - Resources provided to assist with smoking cessation please see AVS  AVS I am glad you have done well after your procedure. We have reviewed your biopsy results. 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 I have referred you to radiation oncology for treatment. You will get a call to get this scheduled. I have ordered an MRI Brain to be done at [facility] to complete your staging. They will take great care of you. Call if you need us . Please contact office for sooner follow up if symptoms do not improve or worsen or seek emergency care   Please work on quitting smoking You can receive free nicotine  replacement therapy (patches, gum, or mints) by calling 1-800-QUIT NOW. Please call so we can get you on the path to becoming a non-smoker. I know it is hard, but you can do this!  Hypnosis for smoking cessation  [employer] [phone]  Acupuncture for smoking cessation  [employer] [phone]      I spent 35 minutes dedicated to the care of this patient on the date of this encounter to include pre-visit review of records, face-to-face time with the patient discussing conditions above, post visit  ordering of testing, clinical documentation with the electronic health record, making appropriate referrals as documented, and communicating necessary information to the patient's healthcare team.   Lauraine JULIANNA Lites, NP 03/23/2024  8:40 AM

## 2024-03-24 NOTE — Telephone Encounter (Signed)
 Appointments scheduled

## 2024-03-29 NOTE — Progress Notes (Signed)
 Location of tumor and Histology per Pathology Report:   Biopsy:   Past/Anticipated interventions by surgeon, if any:   Past/Anticipated interventions by medical oncology, if any: NA   Pain issues, if any:  no    SAFETY ISSUES: Prior radiation? no Pacemaker/ICD? no Possible current pregnancy? no Is the patient on methotrexate? no  Current Complaints / other details:      BP 116/78 (BP Location: Left Arm, Patient Position: Sitting, Cuff Size: Large)   Pulse 85   Temp 97.9 F (36.6 C)   Resp 20   Ht 5' (1.524 m)   Wt 196 lb 9.6 oz (89.2 kg)   SpO2 97%   BMI 38.40 kg/m

## 2024-03-30 ENCOUNTER — Telehealth: Payer: Self-pay | Admitting: *Deleted

## 2024-03-30 ENCOUNTER — Ambulatory Visit (HOSPITAL_COMMUNITY)
Admission: RE | Admit: 2024-03-30 | Discharge: 2024-03-30 | Disposition: A | Discharge status: Home / Self Care | Source: Ambulatory Visit | Attending: Acute Care | Admitting: Acute Care

## 2024-03-30 ENCOUNTER — Ambulatory Visit: Payer: Self-pay | Admitting: Acute Care

## 2024-03-30 DIAGNOSIS — C3490 Malignant neoplasm of unspecified part of unspecified bronchus or lung: Secondary | ICD-10-CM | POA: Insufficient documentation

## 2024-03-30 MED ORDER — PREDNISONE 10 MG PO TABS: ORAL_TABLET | ORAL | 0 refills | Status: DC

## 2024-03-30 MED ORDER — GADOBUTROL 1 MMOL/ML IV SOLN
8.0000 mL | Freq: Once | INTRAVENOUS | Status: AC | PRN
  Administered 2024-03-30: 8 mL via INTRAVENOUS

## 2024-03-30 NOTE — Telephone Encounter (Signed)
 Copied from CRM [id number]. Topic: Clinical - Prescription Issue >> Mar 29, 2024 10:19 AM Corean SAUNDERS wrote: Reason for CRM: Patient is requesting Dr. Darlean or Lauraine Lites to please order Prednisone  and send it to [id number] - [place], [place] - [address] [place] [address] Phone: [phone] Fax: [phone]  I called and spoke with the pt  She is c/o increased SOB past 2 days  She has noticed a little wheezing  Minimal increase in cough- non prod She has not had any fevers, aches She is taking her trelegy daily, albuterol  inhaler, albuterol  nebs  She is asking for rx for pred taper  Dr Darlean, please advise, thanks!

## 2024-03-30 NOTE — Progress Notes (Signed)
 This encounter was created in error - please disregard.

## 2024-03-30 NOTE — Telephone Encounter (Signed)
 Veronica Ozell NOVAK, MD to Me      03/30/24 11:42 AM Prednisone  10 mg take  4 each am x 2 days,   2 each am x 2 days,  1 each am x 2 days and stop  Needs f/u ov in next 6 weeks with me    I called and spoke with the pt and notified of response per Dr. Darlean. Pt verbalized understanding. Rx sent to preferred pharm and I have scheduled her for 05/16/24 with Dr Veronica at [facility] office. Nothing further needed.

## 2024-03-31 ENCOUNTER — Other Ambulatory Visit: Payer: Self-pay

## 2024-03-31 NOTE — Telephone Encounter (Signed)
 Called and spoke with patient verbalized understanding.NFN

## 2024-03-31 NOTE — Telephone Encounter (Signed)
-----   Message from Lauraine JULIANNA Lites sent at 03/31/2024 11:23 AM EDT ----- Please call patient and let her know her brain MRI was negative for metastatic disease which is great news. They will review this when she is seen at the cancer center 8/25 as is scheduled. Thanks so  much ----- Message ----- From: Interface, Rad Results In Sent: 03/30/2024   3:13 PM EDT To: Lauraine JULIANNA Lites, NP

## 2024-04-01 NOTE — Progress Notes (Signed)
 The proposed treatment discussed in conference is for discussion purpose only and is not a binding recommendation.  The patients have not been physically examined, or presented with their treatment options.  Therefore, final treatment plans cannot be decided.

## 2024-04-02 NOTE — Progress Notes (Signed)
 Radiation Oncology         [phone] ________________________________  Initial Outpatient Consultation  Name: [name] MRN: [id number]  Date: 04/04/2024  DOB: 13-Jan-1956  RR:Yjfpounw, Veronica BROCKS, MD  Veronica Veronica RAMAN, MD   REFERRING PHYSICIAN: Shelah Veronica RAMAN, MD  DIAGNOSIS: There were no encounter diagnoses.  Squamous cell carcinoma of the left upper lung   HISTORY OF PRESENT ILLNESS::[name] is a 68 y.o. female who is accompanied by ***. 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 She also has a history of COPD and was previously followed by Dr. Darlean in this setting.   She presented for a pre-op chest x-ray (in anticipation of an upcoming knee replacement) on 01/27/24 that incidentally revealed an ill defined LUL nodular opacity.   She was accordingly referred to Dr. Shelah at [facility] for further management and presented for a chest CT on 07/02 which demonstrated: ***     PREVIOUS RADIATION THERAPY: {EXAM; YES/NO:19492::No}  PAST MEDICAL HISTORY:  Past Medical History:  Diagnosis Date   Allergic rhinitis    Anxiety    Arthritis    hands and legs and feet (04/27/2012)   Asthma    Cancer (HCC) 2005   rt arm mole rem cancer   CHF (congestive heart failure) (HCC)    Chronic bronchitis (HCC)    keep it all the time (04/27/2012)   Chronic kidney disease 01/27/2024   recurrent UTIs   Chronic kidney disease (CKD)    Stage III   COPD (chronic obstructive pulmonary disease) (HCC)    COVID-19 virus infection    03-2021   Depression    PT DENIES   Diverticulosis    DJD (degenerative joint disease)    Emphysema    Epilepsy (HCC)    Esophageal stricture    Exertional dyspnea    GERD (gastroesophageal reflux disease)    Glaucoma    bilateral   H/O blood clots    Hx Right leg, clot in left leg in 2024   Heart failure (HCC)    History of COVID-19  03/2021   History of kidney stones    surgery   History of nuclear stress test    Myoview  12/25/22: EF 74, no ischemia or infarction, low risk   Hyperlipidemia    Hypertension    IBS (irritable bowel syndrome)    Migraine    Neck pain    Cervical disc   OSA on CPAP    patient does not wear CPAP, uses oxygen  3L via [place] [facility] and prn per pt on 03/11/24.         6-7 yrs; pt wears CPAP but has not been able to use it lately due to sinus issues   Oxygen  deficiency    USES 3 LITERS 02 CONTINOUS   Pneumonia 2012; 04/27/2012   Recurrent upper respiratory infection (URI)    Right leg DVT (HCC) 1982   groin   Seizures (HCC) 06/2023   last seizure 12/2023   Sensorineural hearing loss (SNHL) of both ears 12/10/2023   does not using hearing aids - need to f/u with MD   Sleep apnea    Type II diabetes mellitus (HCC)    No meds since weight loss, does not check blood sugar   UTI (urinary tract infection)    2 weeks ago   Vertigo 10/24/2014    PAST SURGICAL  HISTORY: Past Surgical History:  Procedure Laterality Date   ABDOMINAL HYSTERECTOMY  2001   partial   BIOPSY  04/16/2021   Procedure: BIOPSY;  Surgeon: Veronica Gustav GAILS, MD;  Location: [facility];  Service: Endoscopy;;   CARPAL TUNNEL RELEASE  ~ 2008   right   CARPAL TUNNEL RELEASE Right    CATARACT EXTRACTION Bilateral    COLONOSCOPY     COLONOSCOPY N/A 12/24/2023   Procedure: COLONOSCOPY;  Surgeon: Veronica Victory LITTIE DOUGLAS, MD;  Location: [facility];  Service: Gastroenterology;  Laterality: N/A;   COLONOSCOPY WITH ESOPHAGOGASTRODUODENOSCOPY (EGD)     CYSTOSCOPY/URETEROSCOPY/HOLMIUM LASER/STENT PLACEMENT Left 01/27/2023   Procedure: CYSTOSCOPY LEFT RETROGRADE PYELOGRAM, LEFT URETEROSCOPY, HOLMIUM LASER LITHOTRIPSY, AND LEFT URETERAL STENT PLACEMENT;  Surgeon: Veronica Rush, MD;  Location: [facility] OR[facility];  Service: Urology;  Laterality: Left;  60 MINUTES   ESOPHAGOGASTRODUODENOSCOPY (EGD) WITH PROPOFOL  N/A 04/16/2021   Procedure:  ESOPHAGOGASTRODUODENOSCOPY (EGD) WITH PROPOFOL ;  Surgeon: Veronica Gustav GAILS, MD;  Location: [facility];  Service: Endoscopy;  Laterality: N/A;   KNEE ARTHROSCOPY  1990's   left   NASAL SEPTOPLASTY W/ TURBINOPLASTY Bilateral 05/23/2016   Procedure: NASAL SEPTOPLASTY WITH TURBINATE REDUCTION;  Surgeon: Veronica Bouche, MD;  Location: [facility] OR;  Service: ENT;  Laterality: Bilateral;   SHOULDER OPEN ROTATOR CUFF REPAIR  ~ 2010   left   SINOSCOPY     SINUS ENDO WITH FUSION Right 05/23/2016   Procedure: LIMITED RIGHT ENDOSCOPIC SINUS SURGERY;  Surgeon: Veronica Bouche, MD;  Location: [facility] OR;  Service: ENT;  Laterality: Right;   TOTAL KNEE ARTHROPLASTY  08/23/2012   Procedure: Right TOTAL KNEE ARTHROPLASTY;  Surgeon: Veronica DELENA Millman, MD;  Location: [facility] OR;  Service: Orthopedics;  Laterality: Right;  RIGHT KNEE ARTHROPLASTY MEDIAL AND LATERAL COMPARTMENTS WITH PATELLA RESURFACING   TUBAL LIGATION  2001   UPPER GASTROINTESTINAL ENDOSCOPY     VIDEO BRONCHOSCOPY WITH ENDOBRONCHIAL NAVIGATION Left 03/15/2024   Procedure: VIDEO BRONCHOSCOPY WITH ENDOBRONCHIAL NAVIGATION;  Surgeon: Veronica Veronica RAMAN, MD;  Location: [facility] ENDOSCOPY;  Service: Pulmonary;  Laterality: Left;    FAMILY HISTORY:  Family History  Problem Relation Age of Onset   Diabetes Mother    Lung cancer Mother        was a smoker   Stroke Mother    Alcohol abuse Father    Lung cancer Father        was a smoker   Emphysema Father        was a smoker   Heart disease Sister    Other Sister        blood clotting disorder   Diabetes Brother    Seizures Brother    Diabetes Maternal Aunt    Breast cancer Maternal Aunt    Esophageal cancer Maternal Uncle    Diabetes Maternal Uncle    Heart disease Maternal Grandfather    Diabetes Maternal Grandfather    Bipolar disorder Son    Muscular dystrophy Son    Heart disease Son    Heart disease Other        uncle   Colon cancer Neg Hx    Pancreatic cancer Neg Hx    Stomach cancer Neg Hx     Colon polyps Neg Hx    Rectal cancer Neg Hx     SOCIAL HISTORY:  Social History   Tobacco Use   Smoking status: Every Day    Current packs/day: 0.25    Average packs/day: 0.5 packs/day for 47.0 years (23.1 ttl pk-yrs)  Types: Cigarettes    Start date: 08/12/1975    Last attempt to quit: 12/30/2020   Smokeless tobacco: Never   Tobacco comments:    1/2 pack a day 03/23/2024 mh  Vaping Use   Vaping status: Never Used  Substance Use Topics   Alcohol use: No    Alcohol/week: 0.0 standard drinks of alcohol   Drug use: No    ALLERGIES:  Allergies  Allergen Reactions   Amoxicillin Anaphylaxis    Breakout, mouth swells   Azithromycin Swelling and Rash    swelling all over, hives, thrush   Cefdinir      Throat swelling after 3rd dose   Chantix  [Varenicline ] Other (See Comments)    bad dreams, difficulty breathing   Clindamycin  Anaphylaxis   Doxycycline  Hyclate Anaphylaxis and Rash    swelling   Levofloxacin  Anaphylaxis   Paxlovid  [Nirmatrelvir -Ritonavir ] Swelling    Throat swelling   Penicillins Anaphylaxis, Shortness Of Breath and Rash    ALMOST DIED, ended up in hospital    Sulfonamide Derivatives Shortness Of Breath, Swelling and Rash    break out from head to toe   Toradol  [Ketorolac  Tromethamine ] Other (See Comments)    Seizure    Clarithromycin Hives    swelling   Nortriptyline Swelling, Rash and Other (See Comments)    Per patient had kidney problems on this medication, could not use bathroom (urinary retention)   Moxifloxacin     Codeine  Nausea And Vomiting   Fluticasone -Salmeterol Other (See Comments)    REACTION: ulcers in mouth   Triptans Rash    Mouth breaks out and start itching    MEDICATIONS:  Current Outpatient Medications  Medication Sig Dispense Refill   acetaminophen  (TYLENOL ) 500 MG tablet Take 1,000 mg by mouth every 6 (six) hours as needed for mild pain (pain score 1-3), moderate pain (pain score 4-6) or headache.     albuterol   (PROAIR  HFA) 108 (90 Base) MCG/ACT inhaler Inhale 2 puffs into the lungs every 6 (six) hours as needed for shortness of breath or wheezing. 18 g 6   albuterol  (PROVENTIL ) (2.5 MG/3ML) 0.083% nebulizer solution USE 1 VIAL IN NEBULIZER EVERY 6 HOURS AS NEEDED FOR SHORTNESS OF BREATH (Patient taking differently: Take 2.5 mg by nebulization every 6 (six) hours as needed for wheezing or shortness of breath.) 75 mL 8   ALPRAZolam  (XANAX ) 0.25 MG tablet Take 0.25 mg by mouth 2 (two) times daily.     Cholecalciferol  (VITAMIN D3) 50 MCG (2000 UT) capsule Take 1 capsule (2,000 Units total) by mouth daily.     famotidine  (PEPCID ) 40 MG tablet Take 40 mg by mouth at bedtime.     ferrous sulfate  325 (65 FE) MG tablet Take 325 mg by mouth daily with breakfast.     fluticasone  (FLONASE ) 50 MCG/ACT nasal spray Place 1 spray into both nostrils 2 (two) times daily.     gabapentin  (NEURONTIN ) 100 MG capsule Take 2 capsules (200 mg total) by mouth 3 (three) times daily.     guaiFENesin  (MUCINEX ) 600 MG 12 hr tablet Take 2 tablets (1,200 mg total) by mouth 2 (two) times daily as needed for cough or to loosen phlegm.     ipratropium-albuterol  (DUONEB) 0.5-2.5 (3) MG/3ML SOLN Take 3 mLs by nebulization every 6 (six) hours as needed (Wheezing/sob).     levETIRAcetam  (KEPPRA ) 250 MG tablet Take 250 mg by mouth 2 (two) times daily.     loratadine  (ALLERGY RELIEF) 10 MG tablet Take 1 tablet (10 mg total) by  mouth daily. 90 tablet 3   methocarbamol (ROBAXIN) 750 MG tablet Take 750 mg by mouth at bedtime.     metoCLOPramide  (REGLAN ) 5 MG tablet Take 1 tablet (5 mg total) by mouth every 12 (twelve) hours as needed for up to 2 doses for nausea. Take 30-45 minutes before evening and AM doses of bowel preparation solution. 2 tablet 0   montelukast  (SINGULAIR ) 10 MG tablet TAKE ONE TABLET BY MOUTH AT BEDTIME 30 tablet 5   OXYGEN  Inhale 3 L into the lungs continuous. [address] [place] PRN and at bedtime     pantoprazole  (PROTONIX ) 40 MG tablet  Take 1 tablet (40 mg total) by mouth 2 (two) times daily. **PLEASE CALL OFFICE TO SCHEDULE FOLLOW UP 60 tablet 0   predniSONE  (DELTASONE ) 10 MG tablet 4 x 2 days, 2 x 2 days, 1 x 2 days, then stop 14 tablet 0   QUEtiapine  (SEROQUEL ) 100 MG tablet TAKE ONE TABLET BY MOUTH AT BEDTIME 30 tablet 0   simvastatin  (ZOCOR ) 40 MG tablet TAKE ONE TABLET BY MOUTH AT BEDTIME (Patient taking differently: Take 40 mg by mouth daily at 6 PM.) 90 tablet 2   topiramate  (TOPAMAX ) 100 MG tablet Take 2 tablets (200 mg total) by mouth 2 (two) times daily. 360 tablet 2   TRELEGY ELLIPTA  200-62.5-25 MCG/ACT AEPB Inhale 1 puff into the lungs daily.     triamcinolone  cream (KENALOG ) 0.1 % Apply 1 Application topically daily as needed (irritation).     No current facility-administered medications for this encounter.    REVIEW OF SYSTEMS:  A 10+ POINT REVIEW OF SYSTEMS WAS OBTAINED including neurology, dermatology, psychiatry, cardiac, respiratory, lymph, extremities, GI, GU, musculoskeletal, constitutional, reproductive, HEENT. ***   PHYSICAL EXAM:  vitals were not taken for this visit.   General: Alert and oriented, in no acute distress HEENT: Head is normocephalic. Extraocular movements are intact. Oropharynx is clear. Neck: Neck is supple, no palpable cervical or supraclavicular lymphadenopathy. Heart: Regular in rate and rhythm with no murmurs, rubs, or gallops. Chest: Clear to auscultation bilaterally, with no rhonchi, wheezes, or rales. Abdomen: Soft, nontender, nondistended, with no rigidity or guarding. Extremities: No cyanosis or edema. Lymphatics: see Neck Exam Skin: No concerning lesions. Musculoskeletal: symmetric strength and muscle tone throughout. Neurologic: Cranial nerves II through XII are grossly intact. No obvious focalities. Speech is fluent. Coordination is intact. Psychiatric: Judgment and insight are intact. Affect is appropriate. ***  ECOG = ***  0 - Asymptomatic (Fully active, able to  carry on all predisease activities without restriction)  1 - Symptomatic but completely ambulatory (Restricted in physically strenuous activity but ambulatory and able to carry out work of a light or sedentary nature. For example, light housework, office work)  2 - Symptomatic, <50% in bed during the day (Ambulatory and capable of all self care but unable to carry out any work activities. Up and about more than 50% of waking hours)  3 - Symptomatic, >50% in bed, but not bedbound (Capable of only limited self-care, confined to bed or chair 50% or more of waking hours)  4 - Bedbound (Completely disabled. Cannot carry on any self-care. Totally confined to bed or chair)  5 - Death   Raylene MM, Creech RH, Tormey DC, et al. [phone]). Toxicity and response criteria of the [facility]. Am. DOROTHA Bridges. Oncol. 5 (6): 649-55  LABORATORY DATA:  Lab Results  Component Value Date   WBC 12.5 (H) 03/15/2024   HGB 12.6 03/15/2024  HCT 40.0 03/15/2024   MCV 101.5 (H) 03/15/2024   PLT 208 03/15/2024   NEUTROABS 22.2 (H) 02/04/2023   Lab Results  Component Value Date   NA 140 03/15/2024   K 4.0 03/15/2024   CL 109 03/15/2024   CO2 20 (L) 03/15/2024   GLUCOSE 113 (H) 03/15/2024   BUN 17 03/15/2024   CREATININE 1.00 03/15/2024   CALCIUM  9.2 03/15/2024      RADIOGRAPHY: MR BRAIN W WO CONTRAST Result Date: 03/30/2024 CLINICAL DATA:  Provided history: Non-small cell lung cancer, staging. Malignant neoplasm of unspecified part of unspecified bronchus or lung. EXAM: MRI HEAD WITHOUT AND WITH CONTRAST TECHNIQUE: Multiplanar, multiecho pulse sequences of the brain and surrounding structures were obtained without and with intravenous contrast. CONTRAST:  8mL GADAVIST  GADOBUTROL  1 MMOL/ML IV SOLN COMPARISON:  Brain MRI 11/14/2014. FINDINGS: Brain: No age-advanced or lobar predominant cerebral atrophy. Multifocal T2 FLAIR hyperintense signal abnormality within the cerebral white matter and  pons, nonspecific but compatible with moderate chronic small vessel ischemic disease. No cortical encephalomalacia is identified. There is no acute infarct. No evidence of an intracranial mass. No chronic intracranial blood products. No extra-axial fluid collection. No midline shift. No pathologic intracranial enhancement identified. Vascular: Maintained flow voids within the proximal large arterial vessels. Skull and upper cervical spine: No focal worrisome marrow lesion. Incompletely assessed cervical spondylosis. Sinuses/Orbits: No mass or acute finding within the imaged orbits. Prior bilateral ocular lens replacement. No significant paranasal sinus disease. Other: 7 mm retention cyst within the posterior nasopharynx on the left. IMPRESSION: 1. No evidence of intracranial metastatic disease. 2. Moderate chronic small vessel ischemic changes within the cerebral white matter and pons, progressed since the MRI of 11/14/2014. Electronically Signed   By: Rockey Childs D.O.   On: 03/30/2024 15:11   DG Chest Port 1 View Result Date: 03/15/2024 EXAM: 1 VIEW XRAY OF THE CHEST 03/15/2024 12:50:50 PM COMPARISON: 01/27/2024 CLINICAL HISTORY: S/P bronchoscopy with biopsy FINDINGS: LUNGS AND PLEURA: No focal pulmonary opacity. No pulmonary edema. No pleural effusion. No pneumothorax. Interstitial thickening and coarsening are nonspecific. The left upper lobe nodule is likely obscured by overlying EKG lead. HEART AND MEDIASTINUM: No acute abnormality of the cardiac and mediastinal silhouettes. Atherosclerosis in the transverse aorta. BONES AND SOFT TISSUES: No acute osseous abnormality. IMPRESSION: 1. No acute findings. 2. Left upper lobe nodule likely obscured by overlying EKG lead. Electronically signed by: Rockey Kilts MD 03/15/2024 01:55 PM EDT RP Workstation: [id number]   DG C-ARM BRONCHOSCOPY Result Date: 03/15/2024 C-ARM BRONCHOSCOPY: Fluoroscopy was utilized by the requesting physician.  No radiographic  interpretation.      IMPRESSION: {diagnosis}  ***  Today, I talked to the patient and family about the findings and work-up thus far.  We discussed the natural history of *** and general treatment, highlighting the role of radiotherapy in the management.  We discussed the available radiation techniques, and focused on the details of logistics and delivery.  We reviewed the anticipated acute and late sequelae associated with radiation in this setting.  The patient was encouraged to ask questions that I answered to the best of my ability. *** A patient consent form was discussed and signed.  We retained a copy for our records.  The patient would like to proceed with radiation and will be scheduled for CT simulation.  PLAN: ***    *** minutes of total time was spent for this patient encounter, including preparation, face-to-face counseling with the patient and coordination of care, physical exam,  and documentation of the encounter.   ------------------------------------------------  Lynwood CHARM Nasuti, PhD, MD  This document serves as a record of services personally performed by Lynwood Nasuti, MD. It was created on his behalf by Dorthy Fuse, a trained medical scribe. The creation of this record is based on the scribe's personal observations and the provider's statements to them. This document has been checked and approved by the attending provider.

## 2024-04-04 ENCOUNTER — Other Ambulatory Visit: Payer: Self-pay | Admitting: Radiology

## 2024-04-04 ENCOUNTER — Encounter: Payer: Self-pay | Admitting: Radiation Oncology

## 2024-04-04 ENCOUNTER — Ambulatory Visit
Admission: RE | Admit: 2024-04-04 | Discharge: 2024-04-04 | Disposition: A | Discharge status: Home / Self Care | Source: Ambulatory Visit | Attending: Radiation Oncology | Admitting: Radiation Oncology

## 2024-04-04 VITALS — BP 116/78 | HR 85 | Temp 97.9°F | Resp 20 | Ht 60.0 in | Wt 196.6 lb

## 2024-04-04 DIAGNOSIS — I509 Heart failure, unspecified: Secondary | ICD-10-CM | POA: Diagnosis not present

## 2024-04-04 DIAGNOSIS — I129 Hypertensive chronic kidney disease with stage 1 through stage 4 chronic kidney disease, or unspecified chronic kidney disease: Secondary | ICD-10-CM | POA: Diagnosis not present

## 2024-04-04 DIAGNOSIS — E785 Hyperlipidemia, unspecified: Secondary | ICD-10-CM | POA: Diagnosis not present

## 2024-04-04 DIAGNOSIS — C3412 Malignant neoplasm of upper lobe, left bronchus or lung: Secondary | ICD-10-CM | POA: Insufficient documentation

## 2024-04-04 DIAGNOSIS — K589 Irritable bowel syndrome without diarrhea: Secondary | ICD-10-CM | POA: Insufficient documentation

## 2024-04-04 DIAGNOSIS — Z8616 Personal history of COVID-19: Secondary | ICD-10-CM | POA: Insufficient documentation

## 2024-04-04 DIAGNOSIS — J45909 Unspecified asthma, uncomplicated: Secondary | ICD-10-CM | POA: Insufficient documentation

## 2024-04-04 DIAGNOSIS — G40909 Epilepsy, unspecified, not intractable, without status epilepticus: Secondary | ICD-10-CM | POA: Insufficient documentation

## 2024-04-04 DIAGNOSIS — Z79899 Other long term (current) drug therapy: Secondary | ICD-10-CM | POA: Diagnosis not present

## 2024-04-04 DIAGNOSIS — J4489 Other specified chronic obstructive pulmonary disease: Secondary | ICD-10-CM | POA: Diagnosis not present

## 2024-04-04 DIAGNOSIS — F17210 Nicotine dependence, cigarettes, uncomplicated: Secondary | ICD-10-CM | POA: Diagnosis not present

## 2024-04-04 DIAGNOSIS — G4730 Sleep apnea, unspecified: Secondary | ICD-10-CM | POA: Insufficient documentation

## 2024-04-04 DIAGNOSIS — Z87440 Personal history of urinary (tract) infections: Secondary | ICD-10-CM | POA: Insufficient documentation

## 2024-04-04 DIAGNOSIS — M47812 Spondylosis without myelopathy or radiculopathy, cervical region: Secondary | ICD-10-CM | POA: Insufficient documentation

## 2024-04-04 DIAGNOSIS — R911 Solitary pulmonary nodule: Secondary | ICD-10-CM

## 2024-04-04 DIAGNOSIS — J432 Centrilobular emphysema: Secondary | ICD-10-CM | POA: Insufficient documentation

## 2024-04-04 DIAGNOSIS — Z801 Family history of malignant neoplasm of trachea, bronchus and lung: Secondary | ICD-10-CM | POA: Insufficient documentation

## 2024-04-04 DIAGNOSIS — N183 Chronic kidney disease, stage 3 unspecified: Secondary | ICD-10-CM | POA: Insufficient documentation

## 2024-04-04 DIAGNOSIS — K219 Gastro-esophageal reflux disease without esophagitis: Secondary | ICD-10-CM | POA: Diagnosis not present

## 2024-04-04 DIAGNOSIS — F419 Anxiety disorder, unspecified: Secondary | ICD-10-CM | POA: Insufficient documentation

## 2024-04-04 MED ORDER — LORAZEPAM 0.5 MG PO TABS: 0.5000 mg | ORAL_TABLET | Freq: Three times a day (TID) | ORAL | 0 refills | Status: AC | PRN

## 2024-04-06 ENCOUNTER — Ambulatory Visit
Admission: RE | Admit: 2024-04-06 | Discharge: 2024-04-06 | Disposition: A | Discharge status: Home / Self Care | Source: Ambulatory Visit | Attending: Radiation Oncology | Admitting: Radiation Oncology

## 2024-04-06 DIAGNOSIS — C3412 Malignant neoplasm of upper lobe, left bronchus or lung: Secondary | ICD-10-CM | POA: Diagnosis present

## 2024-04-06 DIAGNOSIS — Z510 Encounter for antineoplastic radiation therapy: Secondary | ICD-10-CM | POA: Insufficient documentation

## 2024-04-11 ENCOUNTER — Other Ambulatory Visit: Payer: Self-pay | Admitting: Neurology

## 2024-04-12 DIAGNOSIS — C3412 Malignant neoplasm of upper lobe, left bronchus or lung: Secondary | ICD-10-CM | POA: Diagnosis present

## 2024-04-12 DIAGNOSIS — R911 Solitary pulmonary nodule: Secondary | ICD-10-CM | POA: Insufficient documentation

## 2024-04-15 ENCOUNTER — Telehealth: Admitting: Family Medicine

## 2024-04-15 ENCOUNTER — Ambulatory Visit: Payer: Self-pay | Admitting: Internal Medicine

## 2024-04-15 LAB — FUNGUS CULTURE WITH STAIN

## 2024-04-15 LAB — FUNGUS CULTURE RESULT

## 2024-04-15 LAB — FUNGAL ORGANISM REFLEX

## 2024-04-15 NOTE — Progress Notes (Signed)
 Pt did not show up for visit-DWB

## 2024-04-15 NOTE — Telephone Encounter (Signed)
 FYI Only or Action Required?: Action required by provider: request for appointment.  Patient was last seen in primary care on 05/24/2021 by Anders Otto DASEN, MD.  Called Nurse Triage reporting Shortness of Breath, Wheezing, and Cough.  Symptoms began several days ago.  Interventions attempted: Prescription medications: inhaler.  Symptoms are: unchanged.Productive cough, wheezing. Starting radiation next week and I want to get this cleared up. Appointment per Thersia in the practice.  Triage Disposition: See HCP Within 4 Hours (Or PCP Triage)  Patient/caregiver understands and will follow disposition?: YesReason for Disposition  [1] MILD difficulty breathing (e.g., minimal/no SOB at rest, SOB with walking, pulse < 100) AND [2] NEW-onset or WORSE than normal  Answer Assessment - Initial Assessment Questions 1. RESPIRATORY STATUS: Describe your breathing? (e.g., wheezing, shortness of breath, unable to speak, severe coughing)      Cough, SOB 2. ONSET: When did this breathing problem begin?      2 days 3. PATTERN Does the difficult breathing come and go, or has it been constant since it started?      Comes and goes 4. SEVERITY: How bad is your breathing? (e.g., mild, moderate, severe)      mild 5. RECURRENT SYMPTOM: Have you had difficulty breathing before? If Yes, ask: When was the last time? and What happened that time?      yes 6. CARDIAC HISTORY: Do you have any history of heart disease? (e.g., heart attack, angina, bypass surgery, angioplasty)      no 7. LUNG HISTORY: Do you have any history of lung disease?  (e.g., pulmonary embolus, asthma, emphysema)     yes 8. CAUSE: What do you think is causing the breathing problem?      bronchitis 9. OTHER SYMPTOMS: Do you have any other symptoms? (e.g., chest pain, cough, dizziness, fever, runny nose)     wheezing 10. O2 SATURATION MONITOR:  Do you use an oxygen  saturation monitor (pulse oximeter) at home? If  Yes, ask: What is your reading (oxygen  level) today? What is your usual oxygen  saturation reading? (e.g., 95%)       95% 11. PREGNANCY: Is there any chance you are pregnant? When was your last menstrual period?       no 12. TRAVEL: Have you traveled out of the country in the last month? (e.g., travel history, exposures)       no  Protocols used: Breathing Difficulty-A-AH

## 2024-04-15 NOTE — Telephone Encounter (Signed)
 Mutual greeting then call disconnected. Attempted to call pt back, no answer, LVM for call back to pulm office. Placing in call back.     Copied from CRM [id number]. Topic: Clinical - Red Word Triage >> Apr 15, 2024  9:05 AM Russell PARAS wrote: Red Word that prompted transfer to Nurse Triage:   Shortness of breath for couple of days Wheezing Difficulty breathing Frequent dry cough, spitting up phlegm  Pt of Dr. Darlean

## 2024-04-17 ENCOUNTER — Ambulatory Visit
Admission: EM | Admit: 2024-04-17 | Discharge: 2024-04-17 | Disposition: A | Discharge status: Home / Self Care | Source: Intra-hospital | Attending: Nurse Practitioner | Admitting: Nurse Practitioner

## 2024-04-17 ENCOUNTER — Ambulatory Visit (INDEPENDENT_AMBULATORY_CARE_PROVIDER_SITE_OTHER)

## 2024-04-17 VITALS — BP 100/62 | HR 81 | Temp 97.7°F | Resp 16

## 2024-04-17 DIAGNOSIS — J42 Unspecified chronic bronchitis: Secondary | ICD-10-CM

## 2024-04-17 DIAGNOSIS — J441 Chronic obstructive pulmonary disease with (acute) exacerbation: Secondary | ICD-10-CM | POA: Diagnosis not present

## 2024-04-17 DIAGNOSIS — R059 Cough, unspecified: Secondary | ICD-10-CM | POA: Diagnosis not present

## 2024-04-17 LAB — POC SOFIA SARS ANTIGEN FIA: SARS Coronavirus 2 Ag: NEGATIVE

## 2024-04-17 MED ORDER — PREDNISONE 10 MG (21) PO TBPK: ORAL_TABLET | Freq: Every day | ORAL | 0 refills | Status: DC

## 2024-04-17 MED ORDER — HYDROCOD POLI-CHLORPHE POLI ER 10-8 MG/5ML PO SUER: 5.0000 mL | Freq: Every day | ORAL | 0 refills | Status: DC

## 2024-04-17 MED ORDER — MUCINEX DM MAXIMUM STRENGTH 60-1200 MG PO TB12: 1.0000 | ORAL_TABLET | Freq: Two times a day (BID) | ORAL | 0 refills | Status: AC

## 2024-04-17 MED ORDER — MUCINEX DM MAXIMUM STRENGTH 60-1200 MG PO TB12: 1.0000 | ORAL_TABLET | Freq: Two times a day (BID) | ORAL | 0 refills | Status: DC

## 2024-04-17 MED ORDER — DEXAMETHASONE SODIUM PHOSPHATE 10 MG/ML IJ SOLN
10.0000 mg | Freq: Once | INTRAMUSCULAR | Status: AC
  Administered 2024-04-17: 10 mg via INTRAMUSCULAR

## 2024-04-17 MED ORDER — IPRATROPIUM-ALBUTEROL 0.5-2.5 (3) MG/3ML IN SOLN
3.0000 mL | Freq: Once | RESPIRATORY_TRACT | Status: AC
  Administered 2024-04-17: 3 mL via RESPIRATORY_TRACT

## 2024-04-17 NOTE — Discharge Instructions (Signed)
 You were seen today for cough, wheezing, and shortness of breath. Your chest X-ray showed some changes that could be related to infection, but you do not currently have signs of fever, or low oxygen  levels that would require hospital treatment. Because of your history of COPD and lung cancer, your breathing symptoms are being treated as a COPD flare-up. You were prescribed a prednisone  taper to help reduce inflammation in your lungs and improve your breathing. Antibiotics are not needed at this time because of your many allergies and because there are no signs of infection that would require them.  At home, continue to use your oxygen  as prescribed. Take your prednisone  exactly as directed and continue your regular inhalers and nebulizer treatments. Try to stay well-hydrated, get plenty of rest, and avoid smoke or other lung irritants. Quitting smoking is very important for your lung health and will help you recover more effectively.  You already have an appointment scheduled with your lung doctor (pulmonologist) in October, but you should call tomorrow morning to request an earlier appointment given your recent symptoms. Follow up with your primary care provider as well for continued monitoring.  Go to the emergency department right away if you develop fever, chills, worsening shortness of breath, chest pain, confusion, low oxygen  levels, or if you feel suddenly worse.

## 2024-04-17 NOTE — ED Triage Notes (Signed)
 Pt reports cough, congestion and wheezing. Started Wednesday, Has lung cancer.

## 2024-04-17 NOTE — ED Provider Notes (Signed)
 [facility]    CSN: [id number] Arrival date & time: 04/17/24  1334      History   Chief Complaint No chief complaint on file.   HPI [name] is a 68 y.o. female.   Discussed the use of AI scribe software for clinical note transcription with the patient, who gave verbal consent to proceed.   The patient, with a history of COPD requiring 3L oxygen  at night, a recent diagnosis of lung cancer with treatment scheduled to begin in two days, and continued active cigarette smoking, presents with cough, wheezing, and shortness of breath that began 3-4 days ago. The cough is productive. She also reports a migraine headache, which she states is consistent with her typical migraines and unchanged from baseline. She denies fever, chills, body aches, nasal congestion, rhinorrhea, sore throat, nausea, vomiting, or diarrhea. She has been using her MDI and nebulizer for relief, with her last nebulizer treatment around 6:00 AM today. Current medications include Singulair  and Trelegy. She also notes recent exposure to her grandson, who has been ill.  The following portions of the patient's history were reviewed and updated as appropriate: allergies, current medications, past family history, past medical history, past social history, past surgical history, and problem list.    Past Medical History:  Diagnosis Date   Allergic rhinitis    Anxiety    Arthritis    hands and legs and feet (04/27/2012)   Asthma    Cancer (HCC) 2005   rt arm mole rem cancer   CHF (congestive heart failure) (HCC)    Chronic bronchitis (HCC)    keep it all the time (04/27/2012)   Chronic kidney disease 01/27/2024   recurrent UTIs   Chronic kidney disease (CKD)    Stage III   COPD (chronic obstructive pulmonary disease) (HCC)    COVID-19 virus infection    03-2021   Depression    PT DENIES   Diverticulosis    DJD (degenerative joint disease)    Emphysema    Epilepsy (HCC)    Esophageal stricture     Exertional dyspnea    GERD (gastroesophageal reflux disease)    Glaucoma    bilateral   H/O blood clots    Hx Right leg, clot in left leg in 2024   Heart failure (HCC)    History of COVID-19 03/2021   History of kidney stones    surgery   History of nuclear stress test    Myoview  12/25/22: EF 74, no ischemia or infarction, low risk   Hyperlipidemia    Hypertension    IBS (irritable bowel syndrome)    Migraine    Neck pain    Cervical disc   OSA on CPAP    patient does not wear CPAP, uses oxygen  3L via [facility] qhs and prn per pt on 03/11/24.         6-7 yrs; pt wears CPAP but has not been able to use it lately due to sinus issues   Oxygen  deficiency    USES 3 LITERS 02 CONTINOUS   Pneumonia 2012; 04/27/2012   Recurrent upper respiratory infection (URI)    Right leg DVT (HCC) 1982   groin   Seizures (HCC) 06/2023   last seizure 12/2023   Sensorineural hearing loss (SNHL) of both ears 12/10/2023   does not using hearing aids - need to f/u with MD   Sleep apnea    Type II diabetes mellitus (HCC)    No meds since  weight loss, does not check blood sugar   UTI (urinary tract infection)    2 weeks ago   Vertigo 10/24/2014    Patient Active Problem List   Diagnosis Date Noted   Squamous cell carcinoma of 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) 04/04/2024   Lung nodule seen on imaging study 03/01/2024   Solitary pulmonary nodule 01/28/2024   Heme positive stool 12/24/2023   Benign neoplasm of cecum 12/24/2023   Benign neoplasm of ascending colon 12/24/2023   Benign neoplasm of sigmoid colon 12/24/2023   Stage 3a chronic kidney disease (HCC) 06/03/2023   Tobacco abuse 02/04/2023   Kidney stones 01/16/2023   History of nuclear stress test 12/25/2022   History of DVT (deep vein thrombosis) 12/23/2022   Hypokalemia 08/06/2022   Candida infection, esophageal (HCC)    Ventricular diastolic dysfunction determined by echocardiography 04/12/2021   Multiple allergies 04/12/2021   Pressure  injury of ankle, stage 2 (HCC) 01/31/2021   Dehydration 01/18/2021   Aspiration pneumonia (HCC) 12/30/2020   Dysphagia 12/20/2020   Radiculopathy affecting upper extremity 11/12/2020   Shortness of breath 07/26/2019   Fall at home, initial encounter 01/18/2019   Chronic pain of left knee 12/06/2018   Tobacco dependence 07/02/2018   Elevated serum creatinine 06/01/2018   Generalized weakness 05/15/2017   Chronic bronchitis (HCC) 01/14/2017   Claudication (HCC) 04/09/2016   Deviated septum 02/06/2016    Class: Chronic   Nasal turbinate hypertrophy 02/06/2016   Seizure disorder (HCC) 11/12/2015   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 05/23/2015   Rhinitis, chronic 04/25/2015   Dizziness 10/24/2014   Seizure (HCC)    Pulmonary hypertension (HCC) 08/06/2013   (HFpEF) heart failure with preserved ejection fraction (HCC) 08/06/2013   Chronic diastolic heart failure (HCC) 08/06/2013   BPPV (benign paroxysmal positional vertigo) 03/09/2013   Chronic respiratory failure with hypoxia (HCC) 11/01/2012   Chronic constipation 09/14/2012   Left shoulder pain 06/27/2012   Headache 05/09/2012   Anxiety state 02/13/2010   Obstructive sleep apnea 02/19/2009   Vitamin D  deficiency 01/21/2008   OSTEOPENIA 11/24/2007   Irritable bowel syndrome 11/17/2007   GOLD 3 COPD 09/24/2007   Mixed hyperlipidemia 10/08/2006   OBESITY, NOS  BMI 33.8 10/08/2006   Cigarette smoker 10/08/2006   GLAUCOMA 10/08/2006   GERD (gastroesophageal reflux disease) 10/08/2006   HERNIA, HIATAL, NONCONGENITAL 10/08/2006   INCONTINENCE, STRESS, FEMALE 10/08/2006   DJD (degenerative joint disease) of knee 10/08/2006    Past Surgical History:  Procedure Laterality Date   ABDOMINAL HYSTERECTOMY  2001   partial   BIOPSY  04/16/2021   Procedure: BIOPSY;  Surgeon: Shila Gustav GAILS, MD;  Location: [facility];  Service: Endoscopy;;   CARPAL TUNNEL RELEASE  ~ 2008   right   CARPAL TUNNEL  RELEASE Right    CATARACT EXTRACTION Bilateral    COLONOSCOPY     COLONOSCOPY N/A 12/24/2023   Procedure: COLONOSCOPY;  Surgeon: Legrand Victory LITTIE DOUGLAS, MD;  Location: [facility];  Service: Gastroenterology;  Laterality: N/A;   COLONOSCOPY WITH ESOPHAGOGASTRODUODENOSCOPY (EGD)     CYSTOSCOPY/URETEROSCOPY/HOLMIUM LASER/STENT PLACEMENT Left 01/27/2023   Procedure: CYSTOSCOPY LEFT RETROGRADE PYELOGRAM, LEFT URETEROSCOPY, HOLMIUM LASER LITHOTRIPSY, AND LEFT URETERAL STENT PLACEMENT;  Surgeon: Watt Rush, MD;  Location: [facility];  Service: Urology;  Laterality: Left;  60 MINUTES   ESOPHAGOGASTRODUODENOSCOPY (EGD) WITH PROPOFOL  N/A 04/16/2021   Procedure: ESOPHAGOGASTRODUODENOSCOPY (EGD) WITH PROPOFOL ;  Surgeon: Shila Gustav GAILS, MD;  Location: [facility];  Service: Endoscopy;  Laterality: N/A;  KNEE ARTHROSCOPY  1990's   left   NASAL SEPTOPLASTY W/ TURBINOPLASTY Bilateral 05/23/2016   Procedure: NASAL SEPTOPLASTY WITH TURBINATE REDUCTION;  Surgeon: Alm Bouche, MD;  Location: [facility] OR;  Service: ENT;  Laterality: Bilateral;   SHOULDER OPEN ROTATOR CUFF REPAIR  ~ 2010   left   SINOSCOPY     SINUS ENDO WITH FUSION Right 05/23/2016   Procedure: LIMITED RIGHT ENDOSCOPIC SINUS SURGERY;  Surgeon: Alm Bouche, MD;  Location: [facility] OR;  Service: ENT;  Laterality: Right;   TOTAL KNEE ARTHROPLASTY  08/23/2012   Procedure: Right TOTAL KNEE ARTHROPLASTY;  Surgeon: Lamar DELENA Millman, MD;  Location: [facility];  Service: Orthopedics;  Laterality: Right;  RIGHT KNEE ARTHROPLASTY MEDIAL AND LATERAL COMPARTMENTS WITH PATELLA RESURFACING   TUBAL LIGATION  2001   UPPER GASTROINTESTINAL ENDOSCOPY     VIDEO BRONCHOSCOPY WITH ENDOBRONCHIAL NAVIGATION Left 03/15/2024   Procedure: VIDEO BRONCHOSCOPY WITH ENDOBRONCHIAL NAVIGATION;  Surgeon: Shelah Lamar RAMAN, MD;  Location: [facility];  Service: Pulmonary;  Laterality: Left;    OB History   No obstetric history on file.      Home Medications    Prior to Admission  medications   Medication Sig Start Date End Date Taking? Authorizing Provider  chlorpheniramine-HYDROcodone  (TUSSIONEX) 10-8 MG/5ML Take 5 mLs by mouth at bedtime. 04/17/24  Yes Iola Lukes, FNP  Dextromethorphan -guaiFENesin  (MUCINEX  DM MAXIMUM STRENGTH) 60-1200 MG TB12 Take 1 tablet by mouth 2 (two) times daily. 04/17/24  Yes Sheila Gervasi, Lukes, FNP  predniSONE  (STERAPRED UNI-PAK 21 TAB) 10 MG (21) TBPK tablet Take by mouth daily. Take 6 tabs by mouth daily  for 2 days, then 5 tabs for 2 days, then 4 tabs for 2 days, then 3 tabs for 2 days, 2 tabs for 2 days, then 1 tab by mouth daily for 2 days 04/17/24  Yes Iola Lukes, FNP  acetaminophen  (TYLENOL ) 500 MG tablet Take 1,000 mg by mouth every 6 (six) hours as needed for mild pain (pain score 1-3), moderate pain (pain score 4-6) or headache.    [provider]  albuterol  (PROAIR  HFA) 108 (90 Base) MCG/ACT inhaler Inhale 2 puffs into the lungs every 6 (six) hours as needed for shortness of breath or wheezing. 09/22/23   [name], MD  albuterol  (PROVENTIL ) (2.5 MG/3ML) 0.083% nebulizer solution USE 1 VIAL IN NEBULIZER EVERY 6 HOURS AS NEEDED FOR SHORTNESS OF BREATH Patient taking differently: Take 2.5 mg by nebulization every 6 (six) hours as needed for wheezing or shortness of breath. 01/18/21   [name], Rockie, MD  ALPRAZolam  (XANAX ) 0.25 MG tablet Take 0.25 mg by mouth 2 (two) times daily. 07/14/22   [provider]  Cholecalciferol  (VITAMIN D3) 50 MCG (2000 UT) capsule Take 1 capsule (2,000 Units total) by mouth daily. 03/15/24   Shelah Lamar RAMAN, MD  famotidine  (PEPCID ) 40 MG tablet Take 40 mg by mouth at bedtime. 07/25/22   [provider]  ferrous sulfate  325 (65 FE) MG tablet Take 325 mg by mouth daily with breakfast. 02/02/24   [provider]  fluticasone  (FLONASE ) 50 MCG/ACT nasal spray Place 1 spray into both nostrils 2 (two) times daily. 02/02/24 02/01/25  [provider]  gabapentin   (NEURONTIN ) 100 MG capsule Take 2 capsules (200 mg total) by mouth 3 (three) times daily. 03/15/24   Byrum, Robert S, MD  guaiFENesin  (MUCINEX ) 600 MG 12 hr tablet Take 2 tablets (1,200 mg total) by mouth 2 (two) times daily as needed for cough or to loosen phlegm. 03/15/24  Byrum, Robert S, MD  ipratropium-albuterol  (DUONEB) 0.5-2.5 (3) MG/3ML SOLN Take 3 mLs by nebulization every 6 (six) hours as needed (Wheezing/sob). 12/30/22   [provider]  levETIRAcetam  (KEPPRA ) 250 MG tablet Take 250 mg by mouth 2 (two) times daily. 01/17/24   [provider]  loratadine  (ALLERGY RELIEF) 10 MG tablet Take 1 tablet (10 mg total) by mouth daily. 01/13/23   Parrett, Madelin RAMAN, NP  LORazepam  (ATIVAN ) 0.5 MG tablet Take 1 tablet (0.5 mg total) by mouth every 8 (eight) hours as needed for anxiety. 04/04/24   Wyatt Leeroy HERO, PA-C  methocarbamol (ROBAXIN) 750 MG tablet Take 750 mg by mouth at bedtime. 03/31/23   [provider]  metoCLOPramide  (REGLAN ) 5 MG tablet Take 1 tablet (5 mg total) by mouth every 12 (twelve) hours as needed for up to 2 doses for nausea. Take 30-45 minutes before evening and AM doses of bowel preparation solution. 10/13/23   Legrand Victory LITTIE DOUGLAS, MD  montelukast  (SINGULAIR ) 10 MG tablet TAKE ONE TABLET BY MOUTH AT BEDTIME 06/27/22   Sood, Vineet, MD  OXYGEN  Inhale 3 L into the lungs continuous. Via [place] PRN and at bedtime    [provider]  pantoprazole  (PROTONIX ) 40 MG tablet Take 1 tablet (40 mg total) by mouth 2 (two) times daily. **PLEASE CALL OFFICE TO SCHEDULE FOLLOW UP 01/29/22   Beather Delon Gibson, PA  QUEtiapine  (SEROQUEL ) 100 MG tablet TAKE ONE TABLET BY MOUTH AT BEDTIME 02/01/24   Camara, Amadou, MD  simvastatin  (ZOCOR ) 40 MG tablet TAKE ONE TABLET BY MOUTH AT BEDTIME Patient taking differently: Take 40 mg by mouth daily at 6 PM. 03/18/21   [name], Rockie, MD  topiramate  (TOPAMAX ) 100 MG tablet Take 2 tablets (200 mg total) by mouth 2 (two) times daily.  01/12/24   Camara, Amadou, MD  TRELEGY ELLIPTA  200-62.5-25 MCG/ACT AEPB Inhale 1 puff into the lungs daily. 02/08/24   [provider]  triamcinolone  cream (KENALOG ) 0.1 % Apply 1 Application topically daily as needed (irritation).    [provider]    Family History Family History  Problem Relation Age of Onset   Cancer Mother        lung   Diabetes Mother    Lung cancer Mother        was a smoker   Stroke Mother    Cancer Father        lung   Alcohol abuse Father    Lung cancer Father        was a smoker   Emphysema Father        was a smoker   Heart disease Sister    Other Sister        blood clotting disorder   Diabetes Brother    Seizures Brother    Cancer Maternal Aunt        breast   Diabetes Maternal Aunt    Breast cancer Maternal Aunt    Cancer Maternal Uncle        throat cancer   Esophageal cancer Maternal Uncle    Diabetes Maternal Uncle    Heart disease Maternal Grandfather    Diabetes Maternal Grandfather    Bipolar disorder Son    Muscular dystrophy Son    Heart disease Son    Heart disease Other        uncle   Colon cancer Neg Hx    Pancreatic cancer Neg Hx    Stomach cancer Neg Hx  Colon polyps Neg Hx    Rectal cancer Neg Hx     Social History Social History   Tobacco Use   Smoking status: Every Day    Current packs/day: 0.25    Average packs/day: 0.5 packs/day for 47.1 years (23.1 ttl pk-yrs)    Types: Cigarettes    Start date: 08/12/1975    Last attempt to quit: 12/30/2020   Smokeless tobacco: Never   Tobacco comments:    1/2 pack a day 03/23/2024 mh  Vaping Use   Vaping status: Never Used  Substance Use Topics   Alcohol use: No    Alcohol/week: 0.0 standard drinks of alcohol   Drug use: No     Allergies   Amoxicillin, Azithromycin, Cefdinir , Chantix  [varenicline ], Clindamycin , Doxycycline  hyclate, Levofloxacin , Paxlovid  [nirmatrelvir -ritonavir ], Penicillins, Sulfonamide derivatives, Toradol  [ketorolac   tromethamine ], Clarithromycin, Nortriptyline, Moxifloxacin , Codeine , Fluticasone -salmeterol, and Triptans   Review of Systems Review of Systems  Constitutional:  Negative for chills and fever.  HENT:  Negative for congestion, postnasal drip, rhinorrhea, sneezing and sore throat.   Respiratory:  Positive for cough, shortness of breath and wheezing.   Gastrointestinal:  Negative for diarrhea, nausea and vomiting.  Musculoskeletal:  Negative for myalgias.  Neurological:  Positive for headaches.  All other systems reviewed and are negative.    Physical Exam Triage Vital Signs ED Triage Vitals  Encounter Vitals Group     BP 04/17/24 1414 100/62     Girls Systolic BP Percentile --      Girls Diastolic BP Percentile --      Boys Systolic BP Percentile --      Boys Diastolic BP Percentile --      Pulse Rate 04/17/24 1414 81     Resp 04/17/24 1414 16     Temp 04/17/24 1414 97.7 F (36.5 C)     Temp Source 04/17/24 1414 Oral     SpO2 04/17/24 1414 92 %     Weight --      Height --      Head Circumference --      Peak Flow --      Pain Score 04/17/24 1415 0     Pain Loc --      Pain Education --      Exclude from Growth Chart --    No data found.  Updated Vital Signs BP 100/62 (BP Location: Left Arm)   Pulse 81   Temp 97.7 F (36.5 C) (Oral)   Resp 16   SpO2 93%   Visual Acuity Right Eye Distance:   Left Eye Distance:   Bilateral Distance:    Right Eye Near:   Left Eye Near:    Bilateral Near:     Physical Exam Vitals reviewed.  Constitutional:      General: She is awake. She is not in acute distress.    Appearance: Normal appearance. She is well-developed. She is not ill-appearing, toxic-appearing or diaphoretic.  HENT:     Head: Normocephalic.     Right Ear: Tympanic membrane, ear canal and external ear normal. No drainage, swelling or tenderness. No middle ear effusion. Tympanic membrane is not erythematous.     Left Ear: Tympanic membrane, ear canal and  external ear normal. No drainage, swelling or tenderness.  No middle ear effusion. Tympanic membrane is not erythematous.     Nose: No congestion or rhinorrhea.     Mouth/Throat:     Lips: Pink.     Mouth: Mucous membranes are moist.  Pharynx: No pharyngeal swelling, oropharyngeal exudate, posterior oropharyngeal erythema or uvula swelling.     Tonsils: No tonsillar exudate or tonsillar abscesses.  Eyes:     General: Vision grossly intact.     Conjunctiva/sclera: Conjunctivae normal.  Cardiovascular:     Rate and Rhythm: Normal rate.     Heart sounds: Normal heart sounds.  Pulmonary:     Effort: Pulmonary effort is normal. No tachypnea or respiratory distress.     Breath sounds: Normal air entry. Decreased breath sounds (Diminished breath sounds within the lower lung fields) and wheezing (Scant expiratory wheezing noted in the upper airways) present.  Musculoskeletal:        General: Normal range of motion.     Cervical back: Normal range of motion and neck supple.  Lymphadenopathy:     Cervical: No cervical adenopathy.  Skin:    General: Skin is warm and dry.  Neurological:     General: No focal deficit present.     Mental Status: She is alert and oriented to person, place, and time.  Psychiatric:        Behavior: Behavior is cooperative.      UC Treatments / Results  Labs (all labs ordered are listed, but only abnormal results are displayed) Labs Reviewed  POC SOFIA SARS ANTIGEN FIA    EKG   Radiology DG Chest 2 View Result Date: 04/17/2024 CLINICAL DATA:  Cough, wheezing, and shortness of breath. Recently diagnosed lung cancer. EXAM: CHEST - 2 VIEW COMPARISON:  Chest radiograph dated 03/15/2024, CT chest dated 02/10/2024 FINDINGS: Normal lung volumes. Diffuse interstitial opacities. Lateral left upper lung irregular nodule with fiducials in-situ. No pleural effusion or pneumothorax. The heart size and mediastinal contours are within normal limits. No acute osseous  abnormality. IMPRESSION: 1. Diffuse interstitial opacities, which may represent pulmonary edema or atypical infection. 2. Lateral left upper lung irregular nodule with fiducials in-situ. Electronically Signed   By: Limin  Xu M.D.   On: 04/17/2024 15:27    Procedures Procedures (including critical care time)  Medications Ordered in UC Medications  ipratropium-albuterol  (DUONEB) 0.5-2.5 (3) MG/3ML nebulizer solution 3 mL (3 mLs Nebulization Given 04/17/24 1510)    Initial Impression / Assessment and Plan / UC Course  I have reviewed the triage vital signs and the nursing notes.  Pertinent labs & imaging results that were available during my care of the patient were reviewed by me and considered in my medical decision making (see chart for details).     The patient with a history of COPD on nocturnal oxygen , recent diagnosis of lung cancer pending treatment, and ongoing tobacco use presents with a 3-4 day history of productive cough, wheezing, and shortness of breath. She denies systemic or upper respiratory symptoms aside from her baseline migraines. On exam, she is afebrile, nontoxic, and hemodynamically stable with O? saturation of 92% on room air. Breath sounds are diminished at the bases with scant expiratory wheezing in the upper fields. COVID test was negative. Chest X-ray demonstrates diffuse interstitial opacities concerning for atypical infection or pulmonary edema. Given her extensive pulmonary history and current presentation, antibiotic coverage is indicated. However, treatment is complicated by multiple allergies including macrolides, tetracyclines, fluoroquinolones, penicillins, cephalosporins, sulfa, and clindamycin , limiting empiric therapy options. The patient remains clinically stable for outpatient management. Consultation was placed with the on-call provider at [facility], and after discussion with the nurse practitioner, the case will be reviewed with pulmonologist. I spoke  with Dr. Isaiah of [facility], who recommends  initiating a prednisone  taper for COPD exacerbation. Antibiotic coverage for atypical infection is not indicated at this time given the patient's extensive antibiotic allergies, absence of systemic symptoms, and no evidence of hypoxia. Strongly encouraged smoking cessation as part of management. The patient currently has a pulmonary appointment scheduled for October but was advised to call in the morning to request an earlier evaluation. Strict emergency precautions were reviewed, including prompt ED evaluation if systemic symptoms develop or if hypoxia occurs.  Today's evaluation has revealed no signs of a dangerous process. Discussed diagnosis with patient and/or guardian. Patient and/or guardian aware of their diagnosis, possible red flag symptoms to watch out for and need for close follow up. Patient and/or guardian understands verbal and written discharge instructions. Patient and/or guardian comfortable with plan and disposition.  Patient and/or guardian has a clear mental status at this time, good insight into illness (after discussion and teaching) and has clear judgment to make decisions regarding their care  Documentation was completed with the aid of voice recognition software. Transcription may contain typographical errors. Final Clinical Impressions(s) / UC Diagnoses   Final diagnoses:  Chronic bronchitis, unspecified chronic bronchitis type (HCC)  Cough, unspecified type  COPD exacerbation (HCC)     Discharge Instructions      You were seen today for cough, wheezing, and shortness of breath. Your chest X-ray showed some changes that could be related to infection, but you do not currently have signs of fever, or low oxygen  levels that would require hospital treatment. Because of your history of COPD and lung cancer, your breathing symptoms are being treated as a COPD flare-up. You were prescribed a prednisone  taper to help reduce  inflammation in your lungs and improve your breathing. Antibiotics are not needed at this time because of your many allergies and because there are no signs of infection that would require them.  At home, continue to use your oxygen  as prescribed. Take your prednisone  exactly as directed and continue your regular inhalers and nebulizer treatments. Try to stay well-hydrated, get plenty of rest, and avoid smoke or other lung irritants. Quitting smoking is very important for your lung health and will help you recover more effectively.  You already have an appointment scheduled with your lung doctor (pulmonologist) in October, but you should call tomorrow morning to request an earlier appointment given your recent symptoms. Follow up with your primary care provider as well for continued monitoring.  Go to the emergency department right away if you develop fever, chills, worsening shortness of breath, chest pain, confusion, low oxygen  levels, or if you feel suddenly worse.     ED Prescriptions     Medication Sig Dispense Auth. Provider   predniSONE  (STERAPRED UNI-PAK 21 TAB) 10 MG (21) TBPK tablet Take by mouth daily. Take 6 tabs by mouth daily  for 2 days, then 5 tabs for 2 days, then 4 tabs for 2 days, then 3 tabs for 2 days, 2 tabs for 2 days, then 1 tab by mouth daily for 2 days 42 tablet Aliyah Abeyta, [place], FNP   Dextromethorphan -guaiFENesin  (MUCINEX  DM MAXIMUM STRENGTH) 60-1200 MG TB12 Take 1 tablet by mouth 2 (two) times daily. 20 tablet Iola Lukes, FNP   chlorpheniramine-HYDROcodone  (TUSSIONEX) 10-8 MG/5ML Take 5 mLs by mouth at bedtime. 70 mL Iola Lukes, FNP      I have reviewed the PDMP during this encounter.   Iola Lukes, [place] 04/17/24 [phone]

## 2024-04-18 ENCOUNTER — Ambulatory Visit (HOSPITAL_COMMUNITY): Admitting: Speech Pathology

## 2024-04-18 ENCOUNTER — Inpatient Hospital Stay (HOSPITAL_COMMUNITY): Admission: RE | Admit: 2024-04-18 | Source: Ambulatory Visit

## 2024-04-19 ENCOUNTER — Ambulatory Visit
Admission: RE | Admit: 2024-04-19 | Discharge: 2024-04-19 | Disposition: A | Discharge status: Home / Self Care | Source: Ambulatory Visit | Attending: Radiation Oncology | Admitting: Radiation Oncology

## 2024-04-19 ENCOUNTER — Other Ambulatory Visit: Payer: Self-pay

## 2024-04-19 DIAGNOSIS — C3412 Malignant neoplasm of upper lobe, left bronchus or lung: Secondary | ICD-10-CM | POA: Diagnosis not present

## 2024-04-19 LAB — RAD ONC ARIA SESSION SUMMARY
Course Elapsed Days: 0
Plan Fractions Treated to Date: 1
Plan Prescribed Dose Per Fraction: 18 Gy
Plan Total Fractions Prescribed: 3
Plan Total Prescribed Dose: 54 Gy
Reference Point Dosage Given to Date: 18 Gy
Reference Point Session Dosage Given: 18 Gy
Session Number: 1

## 2024-04-20 ENCOUNTER — Ambulatory Visit

## 2024-04-21 ENCOUNTER — Ambulatory Visit
Admission: RE | Admit: 2024-04-21 | Discharge: 2024-04-21 | Disposition: A | Discharge status: Home / Self Care | Source: Ambulatory Visit | Attending: Radiation Oncology | Admitting: Radiation Oncology

## 2024-04-21 ENCOUNTER — Other Ambulatory Visit: Payer: Self-pay

## 2024-04-21 DIAGNOSIS — C3412 Malignant neoplasm of upper lobe, left bronchus or lung: Secondary | ICD-10-CM

## 2024-04-21 LAB — RAD ONC ARIA SESSION SUMMARY
Course Elapsed Days: 2
Plan Fractions Treated to Date: 2
Plan Prescribed Dose Per Fraction: 18 Gy
Plan Total Fractions Prescribed: 3
Plan Total Prescribed Dose: 54 Gy
Reference Point Dosage Given to Date: 36 Gy
Reference Point Session Dosage Given: 18 Gy
Session Number: 2

## 2024-04-26 ENCOUNTER — Ambulatory Visit
Admission: RE | Admit: 2024-04-26 | Discharge: 2024-04-26 | Disposition: A | Discharge status: Home / Self Care | Source: Ambulatory Visit | Attending: Radiation Oncology | Admitting: Radiation Oncology

## 2024-04-26 ENCOUNTER — Other Ambulatory Visit: Payer: Self-pay

## 2024-04-26 DIAGNOSIS — R911 Solitary pulmonary nodule: Secondary | ICD-10-CM

## 2024-04-26 DIAGNOSIS — C3412 Malignant neoplasm of upper lobe, left bronchus or lung: Secondary | ICD-10-CM | POA: Diagnosis not present

## 2024-04-26 LAB — RAD ONC ARIA SESSION SUMMARY
Course Elapsed Days: 7
Plan Fractions Treated to Date: 3
Plan Prescribed Dose Per Fraction: 18 Gy
Plan Total Fractions Prescribed: 3
Plan Total Prescribed Dose: 54 Gy
Reference Point Dosage Given to Date: 54 Gy
Reference Point Session Dosage Given: 18 Gy
Session Number: 3

## 2024-04-26 LAB — CMP (CANCER CENTER ONLY)
ALT: 7 U/L (ref 0–44)
AST: 9 U/L — ABNORMAL LOW (ref 15–41)
Albumin: 4 g/dL (ref 3.5–5.0)
Alkaline Phosphatase: 104 U/L (ref 38–126)
Anion gap: 5 (ref 5–15)
BUN: 27 mg/dL — ABNORMAL HIGH (ref 8–23)
CO2: 26 mmol/L (ref 22–32)
Calcium: 9.1 mg/dL (ref 8.9–10.3)
Chloride: 107 mmol/L (ref 98–111)
Creatinine: 0.95 mg/dL (ref 0.44–1.00)
GFR, Estimated: >60 mL/min (ref >60)
Glucose, Bld: 138 mg/dL — ABNORMAL HIGH (ref 70–99)
Potassium: 4.6 mmol/L (ref 3.5–5.1)
Sodium: 138 mmol/L (ref 135–145)
Total Bilirubin: 0.3 mg/dL (ref 0.0–1.2)
Total Protein: 7.4 g/dL (ref 6.5–8.1)

## 2024-04-26 LAB — CBC WITH DIFFERENTIAL (CANCER CENTER ONLY)
Abs Immature Granulocytes: 0.08 K/uL — ABNORMAL HIGH (ref 0.00–0.07)
Basophils Absolute: 0 K/uL (ref 0.0–0.1)
Basophils Relative: 0 %
Eosinophils Absolute: 0 K/uL (ref 0.0–0.5)
Eosinophils Relative: 0 %
HCT: 38.9 % (ref 36.0–46.0)
Hemoglobin: 12.4 g/dL (ref 12.0–15.0)
Immature Granulocytes: 1 %
Lymphocytes Relative: 7 %
Lymphs Abs: 0.9 K/uL (ref 0.7–4.0)
MCH: 31.3 pg (ref 26.0–34.0)
MCHC: 31.9 g/dL (ref 30.0–36.0)
MCV: 98.2 fL (ref 80.0–100.0)
Monocytes Absolute: 0.4 K/uL (ref 0.1–1.0)
Monocytes Relative: 3 %
Neutro Abs: 10.8 K/uL — ABNORMAL HIGH (ref 1.7–7.7)
Neutrophils Relative %: 89 %
Platelet Count: 215 K/uL (ref 150–400)
RBC: 3.96 MIL/uL (ref 3.87–5.11)
RDW: 14.5 % (ref 11.5–15.5)
WBC Count: 12.2 K/uL — ABNORMAL HIGH (ref 4.0–10.5)
nRBC: 0 % (ref 0.0–0.2)

## 2024-04-27 ENCOUNTER — Telehealth: Payer: Self-pay

## 2024-04-27 ENCOUNTER — Emergency Department (HOSPITAL_COMMUNITY)
Admission: EM | Admit: 2024-04-27 | Discharge: 2024-04-27 | Discharge status: Against Medical Advice | Source: Ambulatory Visit | Attending: Emergency Medicine | Admitting: Emergency Medicine

## 2024-04-27 ENCOUNTER — Encounter (HOSPITAL_COMMUNITY): Payer: Self-pay

## 2024-04-27 DIAGNOSIS — Z5329 Procedure and treatment not carried out because of patient's decision for other reasons: Secondary | ICD-10-CM | POA: Diagnosis not present

## 2024-04-27 DIAGNOSIS — Z85118 Personal history of other malignant neoplasm of bronchus and lung: Secondary | ICD-10-CM | POA: Diagnosis not present

## 2024-04-27 DIAGNOSIS — R062 Wheezing: Secondary | ICD-10-CM | POA: Insufficient documentation

## 2024-04-27 NOTE — ED Triage Notes (Signed)
 Pt presents with c/o wheezing. Pt reports a hx of lung cancer, just finished radiation yesterday. Pt reports she has had bronchitis for approx 2 weeks but she has not been prescribed anything. Pt has wheezing heard throughout. Pt also reports a headache.

## 2024-04-27 NOTE — ED Provider Notes (Signed)
 [place] EMERGENCY DEPARTMENT AT [facility] Provider Note   CSN: [id number] Arrival date & time: 04/27/24  9046     Patient presents with: Wheezing   [name] is a 68 y.o. female.   Patient left without being seen.       Prior to Admission medications   Medication Sig Start Date End Date Taking? Authorizing Provider  acetaminophen  (TYLENOL ) 500 MG tablet Take 1,000 mg by mouth every 6 (six) hours as needed for mild pain (pain score 1-3), moderate pain (pain score 4-6) or headache.    [provider]  albuterol  (PROAIR  HFA) 108 (90 Base) MCG/ACT inhaler Inhale 2 puffs into the lungs every 6 (six) hours as needed for shortness of breath or wheezing. 09/22/23   [name], MD  albuterol  (PROVENTIL ) (2.5 MG/3ML) 0.083% nebulizer solution USE 1 VIAL IN NEBULIZER EVERY 6 HOURS AS NEEDED FOR SHORTNESS OF BREATH Patient taking differently: Take 2.5 mg by nebulization every 6 (six) hours as needed for wheezing or shortness of breath. 01/18/21   [name], [name], MD  ALPRAZolam  (XANAX ) 0.25 MG tablet Take 0.25 mg by mouth 2 (two) times daily. 07/14/22   [provider]  chlorpheniramine-HYDROcodone  (TUSSIONEX) 10-8 MG/5ML Take 5 mLs by mouth at bedtime. 04/17/24   Iola Lukes, FNP  Cholecalciferol  (VITAMIN D3) 50 MCG (2000 UT) capsule Take 1 capsule (2,000 Units total) by mouth daily. 03/15/24   Shelah Lamar RAMAN, MD  Dextromethorphan -guaiFENesin  (MUCINEX  DM MAXIMUM STRENGTH) 60-1200 MG TB12 Take 1 tablet by mouth 2 (two) times daily. 04/17/24   Iola Lukes, FNP  famotidine  (PEPCID ) 40 MG tablet Take 40 mg by mouth at bedtime. 07/25/22   [provider]  ferrous sulfate  325 (65 FE) MG tablet Take 325 mg by mouth daily with breakfast. 02/02/24   [provider]  fluticasone  (FLONASE ) 50 MCG/ACT nasal spray Place 1 spray into both nostrils 2 (two) times daily. 02/02/24 02/01/25  [provider]  gabapentin  (NEURONTIN ) 100  MG capsule Take 2 capsules (200 mg total) by mouth 3 (three) times daily. 03/15/24   Byrum, Robert S, MD  guaiFENesin  (MUCINEX ) 600 MG 12 hr tablet Take 2 tablets (1,200 mg total) by mouth 2 (two) times daily as needed for cough or to loosen phlegm. 03/15/24   Byrum, Robert S, MD  ipratropium-albuterol  (DUONEB) 0.5-2.5 (3) MG/3ML SOLN Take 3 mLs by nebulization every 6 (six) hours as needed (Wheezing/sob). 12/30/22   [provider]  levETIRAcetam  (KEPPRA ) 250 MG tablet Take 250 mg by mouth 2 (two) times daily. 01/17/24   [provider]  loratadine  (ALLERGY RELIEF) 10 MG tablet Take 1 tablet (10 mg total) by mouth daily. 01/13/23   Parrett, Madelin RAMAN, NP  LORazepam  (ATIVAN ) 0.5 MG tablet Take 1 tablet (0.5 mg total) by mouth every 8 (eight) hours as needed for anxiety. 04/04/24   Wyatt Leeroy HERO, PA-C  methocarbamol (ROBAXIN) 750 MG tablet Take 750 mg by mouth at bedtime. 03/31/23   [provider]  metoCLOPramide  (REGLAN ) 5 MG tablet Take 1 tablet (5 mg total) by mouth every 12 (twelve) hours as needed for up to 2 doses for nausea. Take 30-45 minutes before evening and AM doses of bowel preparation solution. 10/13/23   Legrand Victory LITTIE DOUGLAS, MD  montelukast  (SINGULAIR ) 10 MG tablet TAKE ONE TABLET BY MOUTH AT BEDTIME 06/27/22   Sood, Vineet, MD  OXYGEN  Inhale 3 L into the lungs continuous. Via Dola PRN and at bedtime    [provider]  pantoprazole  (PROTONIX ) 40 MG tablet Take 1 tablet (40 mg total) by mouth 2 (two) times daily. **PLEASE CALL OFFICE TO SCHEDULE FOLLOW UP 01/29/22   Beather Delon Gibson, PA  predniSONE  (STERAPRED UNI-PAK 21 TAB) 10 MG (21) TBPK tablet Take by mouth daily. Take 6 tabs by mouth daily  for 2 days, then 5 tabs for 2 days, then 4 tabs for 2 days, then 3 tabs for 2 days, 2 tabs for 2 days, then 1 tab by mouth daily for 2 days 04/17/24   Iola Lukes, FNP  QUEtiapine  (SEROQUEL ) 100 MG tablet TAKE ONE TABLET BY MOUTH AT BEDTIME 02/01/24   Camara, Amadou, MD   simvastatin  (ZOCOR ) 40 MG tablet TAKE ONE TABLET BY MOUTH AT BEDTIME Patient taking differently: Take 40 mg by mouth daily at 6 PM. 03/18/21   [name], Rockie, MD  topiramate  (TOPAMAX ) 100 MG tablet Take 2 tablets (200 mg total) by mouth 2 (two) times daily. 01/12/24   Camara, Amadou, MD  TRELEGY ELLIPTA  200-62.5-25 MCG/ACT AEPB Inhale 1 puff into the lungs daily. 02/08/24   [provider]  triamcinolone  cream (KENALOG ) 0.1 % Apply 1 Application topically daily as needed (irritation).    [provider]    Allergies: Amoxicillin, Azithromycin, Cefdinir , Chantix  [varenicline ], Clindamycin , Doxycycline  hyclate, Levofloxacin , Paxlovid  [nirmatrelvir -ritonavir ], Penicillins, Sulfonamide derivatives, Toradol  [ketorolac  tromethamine ], Clarithromycin, Nortriptyline, Moxifloxacin , Codeine , Fluticasone -salmeterol, and Triptans    Review of Systems  Updated Vital Signs BP 131/75 (BP Location: Right Arm)   Pulse (!) 102   Temp 98.2 F (36.8 C) (Oral)   Resp 16   SpO2 97%   Physical Exam  (all labs ordered are listed, but only abnormal results are displayed) Labs Reviewed - No data to display  EKG: None  Radiology: No results found.   Procedures   Medications Ordered in the ED - No data to display                                  Medical Decision Making  Patient left without being seen.  We did not get a advance notice that she was leaving.  Final diagnoses:  Wheezing    ED Discharge Orders     None          Geraldene Hamilton, MD 04/27/24 1219

## 2024-04-27 NOTE — Radiation Completion Notes (Addendum)
  Radiation Oncology         [phone] ________________________________  Name: [name] MRN: [id number]  Date of Service: 04/26/2024  DOB: 05-08-1956  End of Treatment Note  Diagnosis: Stage IA2 (cT1b, N0, M0) Squamous cell carcinoma, NSCLC, of the left upper lung  Intent: Curative     ==========DELIVERED PLANS==========  First Treatment Date: 2024-04-19 Last Treatment Date: 2024-04-26   Plan Name: Lung_L_SBRT Site: Lung, Left Technique: SBRT/SRT-IMRT Mode: Photon Dose Per Fraction: 18 Gy Prescribed Dose (Delivered / Prescribed): 54 Gy / 54 Gy Prescribed Fxs (Delivered / Prescribed): 3 / 3     ====================================   The patient tolerated radiation. She noted shortness of breath and a cough as well as dysphagia/odynophagia throughout her treatment.   The patient will return in one month for follow-up.      Ronita Due, PA-C

## 2024-04-27 NOTE — Telephone Encounter (Signed)
 Patient came into clinic to possibly receive oral antibiotics due to abnormal lung sounds and shortness of breath. Patient has several anaphylactic allergies to antibiotics. Per MD encouraged patient to be seen in the ED for evaluation. Offered to take patient to [facility].  Patient requested to be seen at the [facility] ED due to it being closer to home.  Called and gave report to charge nurse at [facility] whom informed me that they do not have any beds available at this time. Charge nurse to call patient and make her aware.   Vital signs: 120/65, 98.0,22,98% 96 bpm

## 2024-04-27 NOTE — ED Notes (Signed)
 Pt states she is leaving because the doctor is taking too long and she has to drive an hour and a half home

## 2024-04-28 LAB — ACID FAST CULTURE WITH REFLEXED SENSITIVITIES (MYCOBACTERIA): Acid Fast Culture: NEGATIVE

## 2024-05-04 ENCOUNTER — Other Ambulatory Visit: Payer: Self-pay | Admitting: Neurology

## 2024-05-06 ENCOUNTER — Other Ambulatory Visit: Payer: Self-pay | Admitting: Internal Medicine

## 2024-05-16 ENCOUNTER — Encounter: Payer: Self-pay | Admitting: Internal Medicine

## 2024-05-16 ENCOUNTER — Ambulatory Visit: Admitting: Internal Medicine

## 2024-05-16 VITALS — BP 100/69 | HR 95 | Ht 60.0 in | Wt 193.0 lb

## 2024-05-16 DIAGNOSIS — R911 Solitary pulmonary nodule: Secondary | ICD-10-CM

## 2024-05-16 DIAGNOSIS — J9611 Chronic respiratory failure with hypoxia: Secondary | ICD-10-CM | POA: Diagnosis not present

## 2024-05-16 DIAGNOSIS — F17210 Nicotine dependence, cigarettes, uncomplicated: Secondary | ICD-10-CM | POA: Diagnosis not present

## 2024-05-16 DIAGNOSIS — J449 Chronic obstructive pulmonary disease, unspecified: Secondary | ICD-10-CM

## 2024-05-16 MED ORDER — CIPROFLOXACIN HCL 500 MG PO TABS: 500.0000 mg | ORAL_TABLET | Freq: Two times a day (BID) | ORAL | 0 refills | Status: DC

## 2024-05-16 NOTE — Assessment & Plan Note (Addendum)
 See cxr  01/27/24   LUL peripherally not seen on lateral  - dx  sq cell ca >SBRT completed 04/26/24 > tolerated well   Will need f/u LCS program once released from f/u by RT/ reviewed.

## 2024-05-16 NOTE — Assessment & Plan Note (Addendum)
 Active smoker  -  PFT's  09/27/21  FEV1 1.05 (47 % ) ratio 0.58  p 5 % improvement from saba p ? 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  >>> cipro  500 mg bid x 10 since tol last rx despite allergy to FQ   >>> max mucinx/ flutter          Each maintenance medication was reviewed in detail including emphasizing most importantly the difference between maintenance and prns and under what circumstances the prns are to be triggered using an action plan format where appropriate.  Total time for H and P, chart review, counseling, reviewing dpi/ neb/ flutter/ 02 / pulse ox / hfa  device(s) and generating customized AVS unique to this office visit / same day charting = 31 min

## 2024-05-16 NOTE — Assessment & Plan Note (Addendum)
 Active smoker  - as of 09/25/22 on 3lpm 247 with nl HC03 but hct 28%  -  as of 01/25/2024 on 3lpm hs and prn with HC03 = 25 on 09/18/23   As of 05/16/2024 >>>  02: 3lpm hs / prn daytime   Reminded to titrate 02 flow to sats > 90%

## 2024-05-16 NOTE — Patient Instructions (Addendum)
 Cipro  500 mg twice daily  x 10 days  For cough/ congestion > mucinex  or mucinex  dm  up to maximum of  1200 mg every 12 hours and use the flutter valve as much as you can    The key is to stop smoking completely before smoking completely stops you!   The key is to stop smoking completely before smoking completely stops you!  Please schedule a follow up office visit in 6 months  call sooner if needed

## 2024-05-16 NOTE — Assessment & Plan Note (Addendum)
 4   min discussion re active cigarette smoking in addition to office E&M  Ask about tobacco use:   ongoing  Advise quitting   I took an extended  opportunity with this patient to outline the consequences of continued cigarette use  in airway disorders based on all the data we have from the multiple national lung health studies (perfomed over decades at millions of dollars in cost)  indicating that smoking cessation, not choice of inhalers or pulmonary physicians, is the most important aspect of her  care.   Assess willingness:  Not committed at this point Assist in quit attempt:  Per PCP when ready Arrange follow up:   Follow up per Primary Care planned

## 2024-05-16 NOTE — Progress Notes (Signed)
 Subjective:   Patient ID: [name], female    DOB: 14-Mar-1956 MRN: [id number]  HPI  15  yowf active smoker with dx of copd aware of sob around 2000 [facility] with exacerbation every few months just disharged from [facility] in 04/2012 for ? CAP with aecopd referred by Dr Jacklin for preop for R TKR Jan 13 14   06/18/2012 Pulmonary preop cc doe x one aisle and since last flare using inhaler saba maybe twice weekly and sleeping ok on cpap maintained on spiriva  and can't take advair due to mouth irritation.  Min am cough/ congestion with thick white mucus.  rec Try to minimize smoking Try tudorza one puff twice daily instead of spiriva  on trial basis for 2 weeks - call if you like it better and we'll send you a prescription    06/24/2023  Sood pt  re-establishing  ov/[place] office/Rox Mcgriff re: GOLD 3 copd/02 dep maint on Trelegy   did not go for fu cxr on last SOOD  ov 02/16/23  Chief Complaint  Patient presents with   COPD GOLD 3  Dyspnea:  limited by L knee to motorized w/c Cough: worse cough since 06/22/23 > green sputum Sleeping: hob up 30 degrees cough x sev days acutely - was not having trouble prior to flare SABA use: twice daily  02: 3lpm bed time and prn daytime  Rec Cipro  500 mg twice daily x  10days  Prednisone  10 mg take  4 each am x 2 days,   2 each am x 2 days,  1 each am x 2 days and stop  Plan A = Automatic = Always=    Trelegy 100  Plan B = Backup (to supplement plan A, not to replace it) Only use your albuterol  inhaler as a rescue medication  Plan C = Crisis (instead of Plan B but only if Plan B stops working) - only use your albuterol  nebulizer if you first try Plan B Make sure you check your oxygen  saturation  AT  your highest level of activity (not after you stop)   to be sure it stays over 90%         09/10/2023  ACUTE  ov/[place] office/Onita Pfluger re: GOLD 3 copd  maint on trelegy 100 and 02 3lpm   Chief Complaint  Patient presents with   Acute Visit    Pt  is having sob and coughing mucus   Dyspnea:  worse x 4 days  Cough: yellow dark thick  Sleeping:  30 degrees  only needed to use rescue x one  SABA use: last neb 3 h   02: 3lpm  24 7  Lung cancer screening: today  Rec Cipro  500 mg twice daily x  10days  Prednisone  10 mg take  4 each am x 2 days,   2 each am x 2 days,  1 each am x 2 days and stop  Plan A = Automatic = Always=    Trelegy 100  Plan B = Backup (to supplement plan A, not to replace it) Only use your albuterol  inhaler as a rescue medication   Plan C = Crisis (instead of Plan B but only if Plan B stops working) - only use your albuterol  nebulizer if you first try Plan B and it fails to help > ok to use the nebulizer up to every 4 hours but if start needing it regularly call for immediate appointment Make sure you check your oxygen  saturation  AT  your highest level  of activity (not after you stop)   to be sure it stays over 90%    01/25/2024  f/u ov/[place] office/Lakesa Coste re: GOLD 3 COPD  maint on Trelegy / never had knee surgery   you wouldn't clear me s cxr  Chief Complaint  Patient presents with   Follow-up    Shob / cough  Dyspnea:  across the room  Cough: little bit of a rattle / minimal mucoid sputum  Sleeping: 30 degrees hosp bed    resp cc  SABA use: once a day / no neb  02:  3lpm hs and none at rest/ 3lpm cont on portable with activity  Lung cancer screening: referred today  Rec Plan A = Automatic = Always=    Trelegy 100 each am  Plan B = Backup (to supplement plan A, not to replace it) Only use your albuterol  inhaler as a rescue medication  Plan C = Crisis (instead of Plan B but only if Plan B stops working) - only use your albuterol  nebulizer if you first try Plan B Be sure you don't use the albuterol / ipatropium as your plan nebulizer   05/16/2024  f/u ov/[place] office/Zack Crager re: GOLD 3 copd/ 02 dep  maint on trelegy   Chief Complaint  Patient presents with   COPD    Cough w green mucus   Dyspnea:   balance and strength  knee pain >  w/c x 2 years  Cough: green x weeks / / using flutter/still smoking but less  Sleeping: 30-45 degrees  s  resp cc  SABA use: 2 x day hfa  02: 3lpm hs / prn daytime   No obvious day to day or daytime variability or assoc   mucus plugs or hemoptysis or cp or chest tightness, subjective wheeze or overt  hb symptoms.    Also denies any obvious fluctuation of symptoms with weather or environmental changes or other aggravating or alleviating factors except as outlined above   No unusual exposure hx or h/o childhood pna/ asthma or knowledge of premature birth.  Current Allergies, Complete Past Medical History, Past Surgical History, Family History, and Social History were reviewed in [employer] record.  ROS  The following are not active complaints unless bolded Hoarseness, sore throat, dysphagia, dental problems, itching, sneezing,  nasal congestion or discharge of excess mucus or purulent secretions, ear ache,   fever, chills, sweats, unintended wt loss or wt gain, classically pleuritic or exertional cp,  orthopnea pnd or arm/hand swelling  or leg swelling, presyncope, palpitations, abdominal pain, anorexia, nausea, vomiting, diarrhea  or change in bowel habits or change in bladder habits, change in stools or change in urine, dysuria, hematuria,  rash, arthralgias, visual complaints, headache, numbness, weakness or ataxia or problems with walking or coordination,  change in mood or  memory.        Current Meds  Medication Sig   acetaminophen  (TYLENOL ) 500 MG tablet Take 1,000 mg by mouth every 6 (six) hours as needed for mild pain (pain score 1-3), moderate pain (pain score 4-6) or headache.   albuterol  (PROVENTIL ) (2.5 MG/3ML) 0.083% nebulizer solution USE 1 VIAL IN NEBULIZER EVERY 6 HOURS AS NEEDED FOR SHORTNESS OF BREATH (Patient taking differently: Take 2.5 mg by nebulization every 6 (six) hours as needed for wheezing or shortness of  breath.)   ALPRAZolam  (XANAX ) 0.25 MG tablet Take 0.25 mg by mouth 2 (two) times daily.   chlorpheniramine-HYDROcodone  (TUSSIONEX) 10-8 MG/5ML Take 5 mLs by mouth at  bedtime.   Cholecalciferol  (VITAMIN D3) 50 MCG (2000 UT) capsule Take 1 capsule (2,000 Units total) by mouth daily.   Dextromethorphan -guaiFENesin  (MUCINEX  DM MAXIMUM STRENGTH) 60-1200 MG TB12 Take 1 tablet by mouth 2 (two) times daily.   famotidine  (PEPCID ) 40 MG tablet Take 40 mg by mouth at bedtime.   ferrous sulfate  325 (65 FE) MG tablet Take 325 mg by mouth daily with breakfast.   fluticasone  (FLONASE ) 50 MCG/ACT nasal spray Place 1 spray into both nostrils 2 (two) times daily.   gabapentin  (NEURONTIN ) 100 MG capsule Take 2 capsules (200 mg total) by mouth 3 (three) times daily.   guaiFENesin  (MUCINEX ) 600 MG 12 hr tablet Take 2 tablets (1,200 mg total) by mouth 2 (two) times daily as needed for cough or to loosen phlegm.   ipratropium-albuterol  (DUONEB) 0.5-2.5 (3) MG/3ML SOLN Take 3 mLs by nebulization every 6 (six) hours as needed (Wheezing/sob).   levETIRAcetam  (KEPPRA ) 250 MG tablet Take 250 mg by mouth 2 (two) times daily.   loratadine  (ALLERGY RELIEF) 10 MG tablet Take 1 tablet (10 mg total) by mouth daily.   LORazepam  (ATIVAN ) 0.5 MG tablet Take 1 tablet (0.5 mg total) by mouth every 8 (eight) hours as needed for anxiety.   methocarbamol (ROBAXIN) 750 MG tablet Take 750 mg by mouth at bedtime.   metoCLOPramide  (REGLAN ) 5 MG tablet Take 1 tablet (5 mg total) by mouth every 12 (twelve) hours as needed for up to 2 doses for nausea. Take 30-45 minutes before evening and AM doses of bowel preparation solution.   montelukast  (SINGULAIR ) 10 MG tablet TAKE ONE TABLET BY MOUTH AT BEDTIME   OXYGEN  Inhale 3 L into the lungs continuous. Via  PRN and at bedtime   pantoprazole  (PROTONIX ) 40 MG tablet Take 1 tablet (40 mg total) by mouth 2 (two) times daily. **PLEASE CALL OFFICE TO SCHEDULE FOLLOW UP   predniSONE  (STERAPRED UNI-PAK 21  TAB) 10 MG (21) TBPK tablet Take by mouth daily. Take 6 tabs by mouth daily  for 2 days, then 5 tabs for 2 days, then 4 tabs for 2 days, then 3 tabs for 2 days, 2 tabs for 2 days, then 1 tab by mouth daily for 2 days   QUEtiapine  (SEROQUEL ) 100 MG tablet TAKE ONE TABLET BY MOUTH AT BEDTIME   simvastatin  (ZOCOR ) 40 MG tablet TAKE ONE TABLET BY MOUTH AT BEDTIME (Patient taking differently: Take 40 mg by mouth daily at 6 PM.)   topiramate  (TOPAMAX ) 100 MG tablet Take 2 tablets (200 mg total) by mouth 2 (two) times daily.   TRELEGY ELLIPTA  200-62.5-25 MCG/ACT AEPB Inhale 1 puff into the lungs daily.   triamcinolone  cream (KENALOG ) 0.1 % Apply 1 Application topically daily as needed (irritation).   VENTOLIN  HFA 108 (90 Base) MCG/ACT inhaler INHALE TWO PUFFS INTO THE LUNGS EVERY 6 HOURS AS NEEDED FOR SHORTNESS OF BREATH OR WHEEZING            Objective:   Physical Exam  Wts  05/16/2024       193  01/25/2024       193   10/22/2023       191  09/10/2023       185 06/24/2023     180  09/25/22 174 lb (78.9 kg)  08/07/22 177 lb 14.6 oz (80.7 kg)  07/10/22 176 lb (79.8 kg)    Vital signs reviewed  01/25/2024  - Note at rest 02 sats  95% on RA   W/c bound wf  congested cough      HEENT : Oropharynx  clear       NECK :  without  apparent JVD/ palpable Nodes/TM    LUNGS: no acc muscle use,  Mild barrel  contour chest wall with bilateral  Distant bs s audible wheeze and  without cough on insp or exp maneuvers  and mild  Hyperresonant  to  percussion bilaterally     CV:  RRR  no s3 or murmur or increase in P2, and no edema   ABD:  soft and nontender   MS:  Nl gait/ ext warm without deformities Or obvious joint restrictions  calf tenderness, cyanosis or clubbing     SKIN: warm and dry without lesions    NEURO:  alert, approp, nl sensorium with  no motor or cerebellar deficits apparent.        Assessment & Plan:   Assessment & Plan Solitary pulmonary nodule See cxr  01/27/24   LUL  peripherally not seen on lateral  - dx  sq cell ca >SBRT completed 04/26/24 > tolerated well   Will need f/u LCS program once released from f/u by RT/ reviewed.   Chronic respiratory failure with hypoxia (HCC) Active smoker  - as of 09/25/22 on 3lpm 247 with nl HC03 but hct 28%  -  as of 01/25/2024 on 3lpm hs and prn with HC03 = 25 on 09/18/23   As of 05/16/2024 >>>  02: 3lpm hs / prn daytime   Reminded to titrate 02 flow to sats > 90%    GOLD 3 COPD Active smoker  -  PFT's  09/27/21  FEV1 1.05 (47 % ) ratio 0.58  p 5 % improvement from saba p ? 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  >>> cipro  500 mg bid x 10 since tol last rx despite allergy to FQ   >>> max mucinx/ flutter          Each maintenance medication was reviewed in detail including emphasizing most importantly the difference between maintenance and prns and under what circumstances the prns are to be triggered using an action plan format where appropriate.  Total time for H and P, chart review, counseling, reviewing dpi/ neb/ flutter/ 02 / pulse ox / hfa  device(s) and generating customized AVS unique to this office visit / same day charting = 31 min        Cigarette smoker 4  min discussion re active cigarette smoking in addition to office E&M  Ask about tobacco use:   ongoing Advise quitting   I took an extended  opportunity with this patient to outline the consequences of continued cigarette use  in airway disorders based on all the data we have from the multiple national lung health studies (perfomed over decades at millions of dollars in cost)  indicating that smoking cessation, not choice of inhalers or  pulmonary physicians, is the most important aspect of her  care.   Assess willingness:  Not committed at this point Assist in quit attempt:  Per PCP when ready Arrange follow up:   Follow  up per Primary Care planned         AVS  Patient Instructions   Cipro  500 mg twice daily  x 10 days  For cough/ congestion > mucinex  or mucinex  dm  up to maximum of  1200 mg every 12 hours and use the flutter valve as much as you can    The key is to stop smoking completely before smoking completely stops you!   The key is to stop smoking completely before smoking completely stops you!  Please schedule a follow up office visit in 6 months  call sooner if needed          Ozell America, MD 05/16/2024

## 2024-05-27 ENCOUNTER — Encounter: Payer: Self-pay | Admitting: Radiation Oncology

## 2024-05-29 NOTE — Progress Notes (Signed)
 Radiation Oncology         [phone] ________________________________  Name: [name] MRN: [id number]  Date: 05/30/2024  DOB: [birth year]  Follow-Up Visit Note  CC: [name], MD  Shelah Lamar RAMAN, MD  No diagnosis found.  Diagnosis: Stage IA2 (cT1b, N0, M0) Squamous cell carcinoma, NSCLC, of the left upper lung   Interval Since Last Radiation: 1 month and 4 days   Intent: Curative Radiation Treatment Dates: First Treatment Date: 2024-04-19 -- Last Treatment Date: 2024-04-26 Site/Dose/Technique/Mode:  Plan Name: Lung_L_SBRT Site: Lung, Left Technique: SBRT/SRT-IMRT Mode: Photon Dose Per Fraction: 18 Gy Prescribed Dose (Delivered / Prescribed): 54 Gy / 54 Gy Prescribed Fxs (Delivered / Prescribed): 3 / 3  Narrative:  The patient returns today for routine follow-up. She did endorse shortness of breath and a cough as well as dysphagia/odynophagia throughout her radiation treatment course. She otherwise tolerated radiation therapy relatively well and denied any other side effects from treatment.  In the interval since she began radiation therapy, she presented to an urgent care facility on 09/07 with c/o cough, wheezing, and SOB for 3-4 days. A chest x-ray was obtained which showed diffuse interstitial opacities, possibly representing pulmonary edema or atypical infection. She was accordingly started on a course of prednisone  for suspected COPD exacerbation and prescribed mucinex  for symptom relief.  She also presented to the ED on 04/27/24 (the day after she completed radiation therapy) with c/o wheezing. She however left before being evaluated.    In most recent history, she followed up with Dr. Darlean on 05/16/24 and reported having an ongoing cough with green mucus at that time. She was prescribed a course of cipro  at that time and instructed to continue using her trelegy. She was also advised to continue using mucinex  for symptoms management.   ***                               Allergies:  is allergic to amoxicillin, azithromycin, cefdinir , chantix  [varenicline ], clindamycin , doxycycline  hyclate, levofloxacin , paxlovid  [nirmatrelvir -ritonavir ], penicillins, sulfonamide derivatives, toradol  [ketorolac  tromethamine ], clarithromycin, nortriptyline, moxifloxacin , codeine , fluticasone -salmeterol, and triptans.  Meds: Current Outpatient Medications  Medication Sig Dispense Refill   acetaminophen  (TYLENOL ) 500 MG tablet Take 1,000 mg by mouth every 6 (six) hours as needed for mild pain (pain score 1-3), moderate pain (pain score 4-6) or headache.     albuterol  (PROVENTIL ) (2.5 MG/3ML) 0.083% nebulizer solution USE 1 VIAL IN NEBULIZER EVERY 6 HOURS AS NEEDED FOR SHORTNESS OF BREATH (Patient taking differently: Take 2.5 mg by nebulization every 6 (six) hours as needed for wheezing or shortness of breath.) 75 mL 8   ALPRAZolam  (XANAX ) 0.25 MG tablet Take 0.25 mg by mouth 2 (two) times daily.     chlorpheniramine-HYDROcodone  (TUSSIONEX) 10-8 MG/5ML Take 5 mLs by mouth at bedtime. 70 mL 0   Cholecalciferol  (VITAMIN D3) 50 MCG (2000 UT) capsule Take 1 capsule (2,000 Units total) by mouth daily.     ciprofloxacin  (CIPRO ) 500 MG tablet Take 1 tablet (500 mg total) by mouth 2 (two) times daily. 20 tablet 0   Dextromethorphan -guaiFENesin  (MUCINEX  DM MAXIMUM STRENGTH) 60-1200 MG TB12 Take 1 tablet by mouth 2 (two) times daily. 20 tablet 0   famotidine  (PEPCID ) 40 MG tablet Take 40 mg by mouth at bedtime.     ferrous sulfate  325 (65 FE) MG tablet Take 325 mg by mouth daily with breakfast.     fluticasone  (FLONASE ) 50 MCG/ACT  nasal spray Place 1 spray into both nostrils 2 (two) times daily.     gabapentin  (NEURONTIN ) 100 MG capsule Take 2 capsules (200 mg total) by mouth 3 (three) times daily.     guaiFENesin  (MUCINEX ) 600 MG 12 hr tablet Take 2 tablets (1,200 mg total) by mouth 2 (two) times daily as needed for cough or to loosen phlegm.     ipratropium-albuterol  (DUONEB) 0.5-2.5  (3) MG/3ML SOLN Take 3 mLs by nebulization every 6 (six) hours as needed (Wheezing/sob).     levETIRAcetam  (KEPPRA ) 250 MG tablet Take 250 mg by mouth 2 (two) times daily.     loratadine  (ALLERGY RELIEF) 10 MG tablet Take 1 tablet (10 mg total) by mouth daily. 90 tablet 3   LORazepam  (ATIVAN ) 0.5 MG tablet Take 1 tablet (0.5 mg total) by mouth every 8 (eight) hours as needed for anxiety. 14 tablet 0   methocarbamol (ROBAXIN) 750 MG tablet Take 750 mg by mouth at bedtime.     metoCLOPramide  (REGLAN ) 5 MG tablet Take 1 tablet (5 mg total) by mouth every 12 (twelve) hours as needed for up to 2 doses for nausea. Take 30-45 minutes before evening and AM doses of bowel preparation solution. 2 tablet 0   montelukast  (SINGULAIR ) 10 MG tablet TAKE ONE TABLET BY MOUTH AT BEDTIME 30 tablet 5   OXYGEN  Inhale 3 L into the lungs continuous. Via [place] PRN and at bedtime     pantoprazole  (PROTONIX ) 40 MG tablet Take 1 tablet (40 mg total) by mouth 2 (two) times daily. **PLEASE CALL OFFICE TO SCHEDULE FOLLOW UP 60 tablet 0   predniSONE  (STERAPRED UNI-PAK 21 TAB) 10 MG (21) TBPK tablet Take by mouth daily. Take 6 tabs by mouth daily  for 2 days, then 5 tabs for 2 days, then 4 tabs for 2 days, then 3 tabs for 2 days, 2 tabs for 2 days, then 1 tab by mouth daily for 2 days 42 tablet 0   QUEtiapine  (SEROQUEL ) 100 MG tablet TAKE ONE TABLET BY MOUTH AT BEDTIME 30 tablet 0   simvastatin  (ZOCOR ) 40 MG tablet TAKE ONE TABLET BY MOUTH AT BEDTIME (Patient taking differently: Take 40 mg by mouth daily at 6 PM.) 90 tablet 2   topiramate  (TOPAMAX ) 100 MG tablet Take 2 tablets (200 mg total) by mouth 2 (two) times daily. 360 tablet 2   TRELEGY ELLIPTA  200-62.5-25 MCG/ACT AEPB Inhale 1 puff into the lungs daily.     triamcinolone  cream (KENALOG ) 0.1 % Apply 1 Application topically daily as needed (irritation).     VENTOLIN  HFA 108 (90 Base) MCG/ACT inhaler INHALE TWO PUFFS INTO THE LUNGS EVERY 6 HOURS AS NEEDED FOR SHORTNESS OF BREATH  OR WHEEZING 18 g 6   No current facility-administered medications for this encounter.    Physical Findings: The patient is in no acute distress. Patient is alert and oriented.  vitals were not taken for this visit. .  No significant changes. Lungs are clear to auscultation bilaterally. Heart has regular rate and rhythm. No palpable cervical, supraclavicular, or axillary adenopathy. Abdomen soft, non-tender, normal bowel sounds.   Lab Findings: Lab Results  Component Value Date   WBC 12.2 (H) 04/26/2024   HGB 12.4 04/26/2024   HCT 38.9 04/26/2024   MCV 98.2 04/26/2024   PLT 215 04/26/2024    Radiographic Findings: No results found.  Impression: Stage IA2 (cT1b, N0, M0) Squamous cell carcinoma, NSCLC, of the left upper lung   The patient is recovering from the  effects of radiation.  ***  Plan:  ***   *** minutes of total time was spent for this patient encounter, including preparation, face-to-face counseling with the patient and coordination of care, physical exam, and documentation of the encounter. ____________________________________  Lynwood CHARM Nasuti, PhD, MD  This document serves as a record of services personally performed by Lynwood Nasuti, MD. It was created on his behalf by Dorthy Fuse, a trained medical scribe. The creation of this record is based on the scribe's personal observations and the provider's statements to them. This document has been checked and approved by the attending provider.

## 2024-05-30 ENCOUNTER — Encounter: Payer: Self-pay | Admitting: Radiation Oncology

## 2024-05-30 ENCOUNTER — Ambulatory Visit
Admission: RE | Admit: 2024-05-30 | Discharge: 2024-05-30 | Disposition: A | Discharge status: Home / Self Care | Payer: Self-pay | Source: Ambulatory Visit | Attending: Radiation Oncology | Admitting: Radiation Oncology

## 2024-05-30 DIAGNOSIS — Z79899 Other long term (current) drug therapy: Secondary | ICD-10-CM | POA: Diagnosis not present

## 2024-05-30 DIAGNOSIS — C3412 Malignant neoplasm of upper lobe, left bronchus or lung: Secondary | ICD-10-CM | POA: Insufficient documentation

## 2024-05-30 DIAGNOSIS — Z7952 Long term (current) use of systemic steroids: Secondary | ICD-10-CM | POA: Insufficient documentation

## 2024-05-30 DIAGNOSIS — Z79891 Long term (current) use of opiate analgesic: Secondary | ICD-10-CM | POA: Insufficient documentation

## 2024-05-30 DIAGNOSIS — Z923 Personal history of irradiation: Secondary | ICD-10-CM | POA: Diagnosis not present

## 2024-05-30 DIAGNOSIS — K219 Gastro-esophageal reflux disease without esophagitis: Secondary | ICD-10-CM | POA: Insufficient documentation

## 2024-05-30 HISTORY — DX: Personal history of irradiation: Z92.3

## 2024-05-30 NOTE — Progress Notes (Signed)
 [name] is here today for follow up post radiation to the lung.  Lung Side: Left, patient completed treatment on 04/26/24  Does the patient complain of any of the following: Pain: Reports intermittent pain to mid chest.  Shortness of breath w/wo exertion: Yes, continues to wear oxygen  @ 3 liters.  Cough: Yes, productive Hemoptysis: NO Pain with swallowing: Yes, due to acid reflux. Patient reports no relief from Protonix .  Swallowing/choking concerns: Yes, on both solids and liquids.  Appetite: Fair  Energy Level: Animator radiation skin Changes: No    Additional comments if applicable:;  BP (P) 135/79 (BP Location: Left Arm, Patient Position: Sitting)   Pulse (P) 92   Temp (!) (P) 97.5 F (36.4 C) (Temporal)   Resp (P) 18   Ht (P) 5' (1.524 m)   Wt (P) 194 lb 2 oz (88.1 kg)   SpO2 (P) 100%   BMI (P) 37.91 kg/m

## 2024-06-02 ENCOUNTER — Other Ambulatory Visit: Payer: Self-pay | Admitting: Neurology

## 2024-06-02 NOTE — Telephone Encounter (Signed)
 The original prescription was discontinued on 07/29/2023 by Gregg Lek, MD for the following reason: Side effect (s)    Please advise, thank you!

## 2024-06-05 ENCOUNTER — Other Ambulatory Visit: Payer: Self-pay | Admitting: Primary Care

## 2024-06-06 ENCOUNTER — Telehealth: Payer: Self-pay | Admitting: *Deleted

## 2024-06-06 NOTE — Telephone Encounter (Signed)
 CALLED PATIENT TO INFORM OF CT FOR 08-30-24- ARRIVAL TIME- 1:15 PM @ [facility], NO RESTRICTIONS TO SCAN, PATIENT TO RECEIVE A PHONE CALL WITH RESULTS ON 09/05/24 @ 11:15 AM WITH THE RESULTS, SPOKE WITH PATIENT AND SHE IS AWARE OF THESE APPTS. AND THE INSTRUCTIONS

## 2024-06-12 ENCOUNTER — Other Ambulatory Visit: Payer: Self-pay | Admitting: Neurology

## 2024-06-13 NOTE — Telephone Encounter (Signed)
 Last seen on 01/12/24 per note  Due to side effects, discontinue Levetiracetam  and Lamotrigine   Follow up scheduled on 07/13/24

## 2024-06-16 ENCOUNTER — Telehealth: Payer: Self-pay | Admitting: Neurology

## 2024-06-16 MED ORDER — QUETIAPINE FUMARATE 100 MG PO TABS: 100.0000 mg | ORAL_TABLET | Freq: Every day | ORAL | 5 refills | Status: AC

## 2024-06-16 NOTE — Telephone Encounter (Signed)
 Pt is needing a refill on her QUEtiapine  (SEROQUEL ) 100 MG tablet and is needing it sent to the [facility] on [facility]

## 2024-06-16 NOTE — Telephone Encounter (Signed)
 E-scribed refill

## 2024-06-16 NOTE — Addendum Note (Signed)
 Addended by: JOSHUA MAURILIO CROME on: 06/16/2024 10:41 AM   Modules accepted: Orders

## 2024-06-24 ENCOUNTER — Other Ambulatory Visit: Payer: Self-pay | Admitting: Primary Care

## 2024-06-28 ENCOUNTER — Other Ambulatory Visit: Payer: Self-pay

## 2024-07-08 ENCOUNTER — Other Ambulatory Visit: Payer: Self-pay | Admitting: Neurology

## 2024-07-12 ENCOUNTER — Telehealth: Payer: Self-pay | Admitting: Neurology

## 2024-07-12 ENCOUNTER — Other Ambulatory Visit: Payer: Self-pay

## 2024-07-12 MED ORDER — TOPIRAMATE 100 MG PO TABS: 200.0000 mg | ORAL_TABLET | Freq: Two times a day (BID) | ORAL | 2 refills | Status: AC

## 2024-07-12 NOTE — Telephone Encounter (Signed)
 Pt is needing her QUEtiapine  (SEROQUEL ) 100 MG tablet and her topiramate  (TOPAMAX ) 100 MG tablet called in to the [facility] in [facility]

## 2024-07-12 NOTE — Telephone Encounter (Signed)
 Sent Topiramate  100mg  to pharmacy. Seroquel  was sent in to pharmacy on 06/16/24 with 5 refills ordered.

## 2024-07-12 NOTE — Discharge Summary (Signed)
 ------------------------------------------------------------------------------- Attestation signed by Feliz Margart Fallow, DO at 07/14/24 1642 I was the supervising physician during time of service.  Case was discussed with APP and I agree with management as outlined below. -------------------------------------------------------------------------------   DISCHARGE SUMMARY [facility]   Discharge date:   July 12, 2024 Length of stay:    LOS: 1 day    Discharge Service:   [facility] Hospitalists Discharge Attending Physician: Joana Marice Hurst, PA Discharge to:    To Home Condition at Discharge:  stable Code status:                         Full Code   Hospital Course: No notes on file  ______________________________________   Admission HPI    Patient admitted on: 07/11/2024 11:11 AM  Patient admitted by: Elspeth Blane Kay, MD    CHIEF COMPLAINT: Shortness of breath and altered mental status   Day of admission HPI:  Via Sirenia Whitis  is a 68 y.o. female with a PMH significant for anxiety, arthritis, asthma, lung cancer, CHF, chronic bronchitis, CKD, COPD, history of COVID-19 in August 2022, epilepsy, GERD, esophageal stricture, negative nuclear stress test in May 2024 (no ischemia or infarct), obstructive sleep apnea and on CPAP, wears oxygen  at home 3 L continuous, pneumonia, right leg DVT, seizures, type 2 diabetes who presented with shortness of breath and altered mental status brought in by family.   History obtained from husband, Armida,.  States that he noticed patient having shortness of breath at 2:00 in the morning and she did not look good.  He let her rest and at 8 in the morning, he went to [facility] to run an errand and was back at 815.  At that time she still did not look good and continued being short of breath.  He offered her breathing treatment but patient refused.  He states that she was staring at him blankly.  At that point, he decided to call EMS so she  could be brought to the hospital for evaluation and treatment.   Armida states that she was fine on Saturday.  On Sunday she did not eat or drink well.  Normally she finishes all of her meal but she did not do so on Sunday.  Overall, she went to bed fine but at 2 in the morning, he noticed the shortness of breath.   By the time she was brought to the hospital, she was noted to have a fever.  She has a history of COPD as well as lung cancer for which she was seen at [facility].  She was given radiation treatment and did well.  They think she might be cancer free on the 40-month ago visit and they plan to see her again in 3 months (March 2026).   Patient has been a smoker since the age of 38 and she smokes currently 3 packs/day.   About a year ago, she had double pneumonia and she was seen at [facility] for that.   At bedside is patient's daughter-in-law Amy who is her PCA (personal care assistant).  Also husband Armida is at bedside.  His cell phone is [phone].   Patient states that he, his son, both of his daughter-in-law's are all POA.  As to the CODE STATUS, they want her full code of CPR and intubation if needed.  Then they will decide if the efforts are futile on how they want to proceed.   Patient  admitted on Home O2? - yes; 3 L continuous Patient on home anticoagulant? -  no Patient admitted with Chronic home foley catheter? - no Foley catheter placed or replaced by another service prior to admission? - no Central Line Status: NONE   Mental Status on Admission: The patient is Alert and oriented to PERSON => she knew her name Emillie The patient is not alert And oriented to TIME => did not go month date or time The patient is alert and oriented to LOCATION => knew she was at the hospital did not know which one (usually goes to any pen)   Problem List, Assessment & Plan     ASSESSMENT & PLAN (In order of descending acuity)   Principal Problem:   Sepsis due to  pneumonia    (CMS-HCC) Active Problems:   Transient alteration of awareness   Shortness of breath   Tachycardia   Tachypnea   Fever   Morbid obesity (CMS-HCC)   Lung cancer    (CMS-HCC)   Nicotine  dependence       Sepsis due to pneumonia    (CMS-HCC)  //  tachycardia  //  Tachypnea  // Transient alteration of awareness  //  Fever Admit to hospitalist service as inpatient to the MedSurg unit with probability of greater than 48-hour stay ER followed sepsis protocol and gave the patient 30 mls/kg of normal saline (1365 mL) ER started patient on IV aztreonam  2 g ER started patient on IV vancomycin  1250 mg ER gave methylprednisolone  125 mg IV ER gave normal saline with 40 mill equivalents of potassium chloride  at 125 cc/h continuous We will continue normal saline at 125 cc/h continuous     Left lobe pneumonia  //  Shortness of breath We will continue IV aztreonam   We will continue IV vancomycin  dosed by pharmacy We will do methylprednisolone  40 mg every 6 hours x 8 doses     Lung cancer    (CMS-HCC) Occurred approximately a year ago Family states the patient received radiation treatment and suspected to be cancer free Follow-up in 3 months from today (March 2026)     Morbid obesity (CMS-HCC) BMI of 37.73 Affects all aspects of care     Nicotine  dependence Nicotine  patch 21 mg   IV fluids: Normal saline at 125 cc per per hour continuous Diet: Heart healthy diabetic diet DVT prophylaxis: Heparin  CODE STATUS: Full code PCP = Dr. Con Gasman   Incidental Findings for ourpatient Follow-Up: No significant incidental findings present    ADDITIONAL NON-ACUTE FINDINGS, OBSERVATIONS, FAMILY DISCUSSIONS, ETC. (When present):   Physical exam General: Patient no longer has altered mental status, she is back to her normal baseline and is fully awake alert and oriented x 3 HEENT [place]/AT; moist mucous membrane Heart: Heart rate 90s: Rhythm is reg    Lungs: Clear to auscultation  bilaterally despite showing left lung pneumonia on x-ray Abdomen: Obese, soft nontender nondistended Extremities: No peripheral edema Skin: Dry no rash Neuropsych: Patient is no longer AMS and is fully awake alert and oriented x 3     DVT Prophylaxis Ordered: SQ Heparin   ______   July 12, 2024 => WBC = 18.3 (was 21.1); (on solumedrol) Potassium = 3.9 (was 3.4) Chest x-ray showed patchy opacities of the left midlung consistent with airspace disease; also has bibasilar hazy opacities Will treat outpatient antibiotics however, pt has multiple drug allergies. Will discuss w/ pharmacist.  => Careful review of patient's chart shows that she can take ciprofloxacin  750  mg twice a day for 10 days.  Pharmacist is agreeable both at [facility] and here at the hospital.  I am also agreeable with this course of action. Patient advised to follow-up with her PCP in the next 3 to 5 days Patient is also asking for pain management of her degenerative joint disease.  I will give her Percocet 10 mg/325 1 tablet every 8 hours as needed pain.  Please take no more than 1 tablet every 8 hours.  Dispense 10 pills to last for 5 days.  She can get the rest of the pain medication from her PCP.  I explained to her that this is a one-time courtesy to write her pain medication.  Generally we do not do this and it will not occur again. ______________________________________  Mental Status On day of Discharge:  The patient is Alert and oriented to PERSON The patient is Alert And oriented to TIME The patient is Alert and oriented to LOCATION  CODE STATUS :                    Full Code   An advanced care planning discussion was  had with patient and/or patient's decisions maker (documented separately).  Patient discharged on Home O2? - yes Patient discharged on home anticoagulant? -  no  Foley Catheter status: None Central Line Status: NONE  Time Spent on Discharge I spent greater than 30 minutes counseling and  coordinating care for the discharge of this patient. The patient and I discussed the importance of outpatient follow-up as well as concerning signs and symptoms that would require immediate evaluation by a medical professional. The aforementioned conversation participants understand  and did show insight. I did use teachback to ensure understanding. The above participant/s is aware that not following the discussed plan, recommendations, and follow up can lead to severe negative effects on the patient's health, up to and including death.  Discharge Medications     Your Medication List     STOP taking these medications    acetaminophen  500 MG tablet Commonly known as: TYLENOL    aspirin  81 MG tablet Commonly known as: ECOTRIN   fluticasone  propionate 50 mcg/actuation nasal spray Commonly known as: FLONASE  ALLERGY RELIEF   guaiFENesin  600 mg 12 hr tablet Commonly known as: MUCINEX    loratadine  10 mg tablet Commonly known as: CLARITIN    methocarbamol 750 MG tablet Commonly known as: ROBAXIN   ondansetron  4 MG tablet Commonly known as: ZOFRAN    pantoprazole  40 MG tablet Commonly known as: Protonix    triamcinolone  0.1 % cream Commonly known as: KENALOG        START taking these medications    ciprofloxacin  HCl 750 MG tablet Commonly known as: CIPRO  Take 1 tablet (750 mg total) by mouth two (2) times a day for 10 days.   oxyCODONE -acetaminophen  10-325 mg per tablet Commonly known as: PERCOCET Take 1 tablet by mouth every eight (8) hours as needed for pain for up to 5 days.   predniSONE  20 MG tablet Commonly known as: DELTASONE  Take 1 tablet (20 mg total) by mouth daily for 5 days.       CONTINUE taking these medications    famotidine  40 MG tablet Commonly known as: PEPCID  Take 1 tablet (40 mg total) by mouth two (2) times a day.   ferrous sulfate  325 (65 FE) MG tablet Take 1 tablet (325 mg total) by mouth in the morning.   gabapentin  100 MG  capsule Commonly known as: NEURONTIN  Take 2  capsules (200 mg total) by mouth Three (3) times a day.   montelukast  10 mg tablet Commonly known as: SINGULAIR  Take 1 tablet (10 mg total) by mouth nightly.   OXYGEN -AIR DELIVERY SYSTEMS MISC Inhale 3 L.   QUEtiapine  100 MG tablet Commonly known as: SEROQUEL  Take 1 tablet (100 mg total) by mouth nightly.   simvastatin  40 MG tablet Commonly known as: ZOCOR  Take 1 tablet (40 mg total) by mouth nightly.   topiramate  100 MG tablet Commonly known as: Topamax  Take 2 tablets (200 mg total) by mouth two (2) times a day.   TRELEGY ELLIPTA  100-62.5-25 mcg inhaler Generic drug: fluticasone -umeclidin-vilanter Inhale 1 puff daily.   VENTOLIN  HFA 90 mcg/actuation inhaler Generic drug: albuterol  Inhale 2 puffs every four (4) hours as needed.   albuterol  1.25 mg/3 mL nebulizer solution Commonly known as: ACCUNEB  Inhale 3 mL (1.25 mg total) by nebulization every six (6) hours as needed for wheezing.   VITAMIN D3 50 mcg (2,000 unit) Cap Generic drug: cholecalciferol  (vitamin D3-50 mcg (2,000 unit)) Take 1 capsule (50 mcg total) by mouth daily.       _____________________________________  Nutrition:                                        ___________________________________________  Discharge Instructions   You came into the hospital with altered mental status and were found to have pneumonia in the left lung.  You were given IV antibiotics and that helped. You will need to take antibiotics by mouth to finish your treatment. I am giving you ciprofloxacin  750 mg twice a day for 10 days.  Please take the medication until all gone  He also states that you have a lot of pain in your hip and your knees and you want something for pain. You asked for Percocet. I am giving you Percocet 10/325.  You can take 1 tablet every 8 hours if needed for pain.  Do not take more often than that.  I have given you enough medication to last you for 5  days.  Generally, I do not give narcotic pain medication.  You will need to see your family doctor and arrange for pain management through him (Dr. Marlee).  I am also giving you prednisone  20 mg daily for 5 days.  Please take the medication until all gone.  We strongly encourage you to stop smoking.  Smoking can get your breathing situation much worse. We also recommend that you take your breathing treatments as scheduled.  You are very sick and it is important for you to follow your doctors instructions.  Is been a pleasure taking care of you. We hope you get well soon. Please do not hesitate to return to our ER or go to the closest ER if you have any questions or concerns.   Mr LOISE. Panwala, PA-C [facility] Hospitalist [facility] [place]    Nutrition:                                   Activity:                                   Activity Instructions     Activity as tolerated  Appointments:                         Appointments which have been scheduled for you    Jul 21, 2024 8:00 AM (Arrive by 7:45 AM) OFFICE VISIT with Lynwood Con Gasman, MD [facility] [address] [facility] [place]) [address] [place] [address] [phone]         Follow Up:                              Follow Up instructions and Outpatient Referrals    Ambulatory Referral to Allergy     Reason for referral: Extensive list of allergies, new patient referral   Requested follow up plan: You would evaluate and manage.   Call MD for:  difficulty breathing, headache or visual disturbances     Call MD for:  persistent dizziness or light-headedness     Call MD for:  persistent nausea or vomiting     Call MD for:  severe uncontrolled pain     Call MD for: Temperature > 38.5 Celsius ( > 101.3 Fahrenheit)     Discharge instructions         Allergies  Allergen Reactions  . Azithromycin Rash and Swelling    swelling all  over, high, thrush swelling all over, high, thrush   . Cefdinir      Throat swelling after 3rd dose  . Clarithromycin Hives    swelling swelling   . Clindamycin  Anaphylaxis  . Doxycycline  Hyclate Anaphylaxis and Rash    swelling swelling   . Ketorolac  Tromethamine  Other (See Comments)    Seizure  . Levofloxacin  Anaphylaxis    Pt can take moxifloxacin   . Nirmatrelvir -Ritonavir  Swelling    Throat swelling  . Nortriptyline Other (See Comments), Rash and Swelling    Per patient had kidney problems on this medication, could not use bathroom (urinary retention), swelling and breakout Per patient had kidney problems on this medication, could not use bathroom (urinary retention)   . Other Rash, Shortness Of Breath and Swelling    break out from head to toe  . Penicillins Anaphylaxis, Rash and Shortness Of Breath    Breakout, mouth swells almost died, ended up in hospital Breakout, mouth swells ALMOST DIED, ended up in hospital  PCN reaction causing immediate rash, facial/tongue/throat swelling, SOB or lightheadedness with hypotension: YES PCN reaction causing severe rash involving mucus membranes or skin necrosis: NO PCN reaction that required hospitalization YES Has patient had a PCN reaction occurring within the last 10 years: NO   . Ritonavir  Swelling  . Sulfa (Sulfonamide Antibiotics) Rash, Shortness Of Breath and Swelling    break out from head to toe  . Varenicline  Other (See Comments)    bad dreams, difficulty breathing bad dreams, difficulty breathing   . Moxifloxacin    . Sumatriptan Succinate Other (See Comments)    Mouth breaks out and start itching  . Toradol  [Ketorolac ]   . Codeine  Nausea And Vomiting  . Fluticasone  Propion-Salmeterol Other (See Comments)    REACTION: ulcers in mouth REACTION: ulcers in mouth   . Naratriptan Rash    Mouth breaks out and start itching  . Sulfamethoxazole Rash  . Triptans-5-Ht1 Antimigraine Agents Rash     Mouth breaks out and start itching     Past Medical History[1]  Past Surgical History[2]   Family History[3]   Current  Medications[4]  Imaging  XR Chest Portable Result Date: 07/11/2024 Exam:  Portable Chest  History:  Cough.  Technique:  Single frontal view.  Comparison:  CT, 09/18/2022  Findings:   Normal cardiac mediastinal silhouette. Patchy opacities at the left midlung could represent airspace disease, while bibasilar hazy opacities favor atelectasis. No large effusion or pneumothorax. No suspicious osseous lesions.    Patchy opacities at the left midlung could represent airspace disease, while bibasilar hazy opacities favor atelectasis.  Signed (Electronic Signature): 07/11/2024 12:10 PM Signed By: Thayer Kitty, MD   Lab Results   Recent Labs    07/12/24 0441  WBC 18.3*  HGB 10.1*  HCT 31.0*  PLT 154   Recent Labs    07/11/24 1142 07/11/24 1143 07/12/24 0441  NA 143  --  141  K 3.4*  --  3.9  CL 109*  --  110*  CO2 21.5  --  21.8  BUN 20  --  19  CREATININE 1.25*  --  1.09  GLU 112  --  147  CALCIUM  8.7  --  8.6  ALBUMIN 3.3*  --   --   PROT 7.2  --   --   BILITOT 0.4  --   --   AST 12*  --   --   ALT 12  --   --   ALKPHOS 120*  --   --   MG 1.9  --   --   PHOS 2.5  --   --   LIPASE 13*  --   --   TSH 0.734  --   --   LACTATE  --  0.8  --    Recent Labs    07/11/24 1142  INR 1.17  APTT 27.8   Recent Labs    07/11/24 1227  WBCUA 0  NITRITE Negative  LEUKOCYTESUR Negative  BACTERIA None Seen  RBCUA 0  BLOODU Negative  GLUCOSEU Negative  PROTEINUA Negative  KETONESU Negative   Recent Labs    07/11/24 1251  OPIAU Negative  BENZU Negative  AMPHU Negative  COCAU Negative  CANNAU Negative  BARBU Negative   No results for input(s): PREGTESTUR, PREGPOC in the last 72 hours. Recent Labs    07/11/24 1142  A1C 5.9*   Recent Labs    07/11/24 1159  PHART 7.33*  PCO2ART 37.6  PO2ART 82*  HCO3ART 19.8  O2SATART 97.1  BEART  -5.6*   Pending Labs     Order Current Status   Urine Culture In process   Blood Culture Preliminary result   Blood Culture Preliminary result       Home Medications   Prior to Admission medications  Medication Dose, Route, Frequency  albuterol  (ACCUNEB ) 1.25 mg/3 mL nebulizer solution 1 ampule, Nebulization, Every 6 hours PRN  famotidine  (PEPCID ) 40 MG tablet 40 mg, Oral, 2 times a day (standard)  ferrous sulfate  325 (65 FE) MG tablet 325 mg, Oral, Daily  fluticasone -umeclidin-vilanter (TRELEGY ELLIPTA ) 100-62.5-25 mcg inhaler 1 puff, Daily (standard)  gabapentin  (NEURONTIN ) 100 MG capsule 200 mg, 3 times a day (standard)  montelukast  (SINGULAIR ) 10 mg tablet 10 mg, Oral, Nightly  OXYGEN -AIR DELIVERY SYSTEMS MISC 3 L  pantoprazole  (PROTONIX ) 40 MG tablet TAKE ONE TABLET BY MOUTH TWICE DAILY 90 Patient not taking: Reported on 07/11/2024  QUEtiapine  (SEROQUEL ) 100 MG tablet 100 mg, Nightly  simvastatin  (ZOCOR ) 40 MG tablet 40 mg, Oral, Nightly  topiramate  (TOPAMAX ) 100 MG tablet Take 2 tablets (200 mg total) by mouth two (  2) times a day.  VENTOLIN  HFA 90 mcg/actuation inhaler 2 puffs, Every 4 hours PRN  VITAMIN D3 50 mcg (2,000 unit) cap 50 mcg, Oral, Daily (standard)  acetaminophen  (TYLENOL ) 500 MG tablet 500 mg Patient not taking: Reported on 07/11/2024  aspirin  (ECOTRIN) 81 MG tablet 81 mg Patient not taking: Reported on 07/11/2024  ciprofloxacin  HCl (CIPRO ) 750 MG tablet 750 mg, Oral, 2 times a day (standard)  fluticasone  propionate (FLONASE  ALLERGY RELIEF) 50 mcg/actuation nasal spray 1 spray, Each Nare, 2 times a day Patient not taking: Reported on 07/11/2024  guaiFENesin  (MUCINEX ) 600 mg 12 hr tablet 600 mg, Oral, 2 times a day (standard) Patient not taking: Reported on 07/11/2024  loratadine  (CLARITIN ) 10 mg tablet 10 mg, Oral, Daily (standard) Patient not taking: Reported on 07/11/2024  methocarbamol (ROBAXIN) 750 MG tablet 750 mg, 3 times a day (standard) Patient not  taking: Reported on 07/11/2024  ondansetron  (ZOFRAN ) 4 MG tablet 4 mg, Oral, Every 8 hours PRN Patient not taking: Reported on 07/11/2024  oxyCODONE -acetaminophen  (PERCOCET) 10-325 mg per tablet 1 tablet, Oral, Every 8 hours PRN  predniSONE  (DELTASONE ) 20 MG tablet 20 mg, Oral, Daily (standard)  triamcinolone  (KENALOG ) 0.1 % cream 1 g, 3 times a day (standard) Patient not taking: Reported on 07/11/2024   Joana JONETTA Hurst, PA Hospitalist, [facility] 07/12/24, 12:48 PM      [1] Past Medical History: Diagnosis Date  . Allergies   . Arthritis   . CKD (chronic kidney disease)   . COPD (chronic obstructive pulmonary disease) (CMS-HCC)   . GERD (gastroesophageal reflux disease)   . Heart failure (CMS-HCC)   . History of migraine headaches   . History of nuclear stress test 12/25/2022   Myoview  12/25/22: EF 74, no ischemia or infarction, low risk  . History of sepsis   . Hyperlipidemia   . Hypokalemia   . Iron deficiency   . Neuropathy   . Seizures    (CMS-HCC)   . Stage II pressure ulcer of right buttock (CMS-HCC)   [2] Past Surgical History: Procedure Laterality Date  . CARPAL TUNNEL RELEASE Right   . Cateract surgery    . Excision of skin cancer    . HYSTERECTOMY  2021   Partial hysterectomy  . JOINT REPLACEMENT Right   . KNEE ARTHROPLASTY Right   . SHOULDER ARTHROSCOPY W/ ROTATOR CUFF REPAIR Left   [3] Family History Problem Relation Age of Onset  . Breast cancer Maternal Aunt        Diagnose in her 25's  [4]  Current Facility-Administered Medications:  .  acetaminophen  (TYLENOL ) suppository 650 mg, 650 mg, Rectal, Q6H PRN, Panwala, Naitik Dhansukh, PA .  acetaminophen  (TYLENOL ) tablet 650 mg, 650 mg, Oral, Q6H PRN, Panwala, Naitik Dhansukh, PA, 650 mg at 07/12/24 0559 .  aspirin  (ECOTRIN) tablet 81 mg, 81 mg, Oral, Daily, Panwala, Naitik Dhansukh, PA, 81 mg at 07/12/24 0821 .  dextrose  (GLUTOSE) 40 % gel 15 g of dextrose , 15 g of dextrose , Oral, Q10 Min PRN, Panwala, Naitik  Dhansukh, PA .  dextrose  50 % in water (D50W) 50 % solution 12.5 g, 12.5 g, Intravenous, Q15 Min PRN, Panwala, Naitik Dhansukh, PA .  famotidine  (PEPCID ) tablet 20 mg, 20 mg, Oral, Daily, Panwala, Naitik Dhansukh, PA, 20 mg at 07/12/24 9178 .  ferrous sulfate  tablet 325 mg, 325 mg, Oral, Every other day (0600), Panwala, Naitik Dhansukh, PA, 325 mg at 07/11/24 1645 .  fluticasone  propionate (FLONASE ) 50 mcg/actuation nasal spray 1 spray, 1 spray, Each  Nare, BID, Panwala, Naitik Dhansukh, PA, 1 spray at 07/12/24 0829 .  gabapentin  (NEURONTIN ) capsule 100 mg, 100 mg, Oral, TID, Panwala, Naitik Dhansukh, PA, 100 mg at 07/12/24 0821 .  glucagon injection 1 mg, 1 mg, Intramuscular, Once PRN, Panwala, Naitik Dhansukh, PA .  guaiFENesin  (MUCINEX ) 12 hr tablet 600 mg, 600 mg, Oral, BID, Panwala, Naitik Dhansukh, PA, 600 mg at 07/12/24 9178 .  guaiFENesin  (ROBITUSSIN) oral syrup, 200 mg, Oral, Q4H PRN, Panwala, Naitik Dhansukh, PA .  heparin  (porcine) 5,000 unit/mL injection 5,000 Units, 5,000 Units, Subcutaneous, Q8H SCH, Panwala, Naitik Dhansukh, PA, 5,000 Units at 07/12/24 0600 .  hydrALAZINE (APRESOLINE) injection 10 mg, 10 mg, Intravenous, Q6H PRN, Panwala, Naitik Dhansukh, PA .  insulin  lispro (HumaLOG) injection CORRECTIONAL 0-20 Units, 0-20 Units, Subcutaneous, ACHS, Panwala, Naitik Dhansukh, PA .  ipratropium-albuterol  (DUO-NEB) 0.5-2.5 mg/3 mL nebulizer solution 3 mL, 3 mL, Nebulization, Q4H (RT), Panwala, Naitik Dhansukh, PA, 3 mL at 07/12/24 1154 .  loratadine  (CLARITIN ) tablet 10 mg, 10 mg, Oral, Daily, Panwala, Naitik Dhansukh, PA, 10 mg at 07/12/24 9178 .  meropenem (MERREM) 1 g in sodium chloride  0.9 % (NS) 100 mL IVPB-connector bag, 1 g, Intravenous, Q12H, Panwala, Naitik Dhansukh, PA, Stopped at 07/12/24 0745 .  methylPREDNISolone  sodium succinate (SOLU-Medrol ) injection 40 mg, 40 mg, Intravenous, Q6H, Panwala, Naitik Dhansukh, PA, 40 mg at 07/12/24 0821 .  montelukast  (SINGULAIR ) tablet 10  mg, 10 mg, Oral, Nightly, Panwala, Naitik Dhansukh, PA, 10 mg at 07/11/24 2014 .  ondansetron  (ZOFRAN -ODT) disintegrating tablet 4 mg, 4 mg, Oral, Q6H PRN **OR** ondansetron  (ZOFRAN ) injection 4 mg, 4 mg, Intravenous, Q6H PRN, Panwala, Naitik Dhansukh, PA .  [Provider Hold] pantoprazole  (Protonix ) EC tablet 40 mg, 40 mg, Oral, Daily before breakfast, Panwala, Naitik Dhansukh, PA .  potassium chloride  ER tablet 20 mEq, 20 mEq, Oral, BID, Panwala, Naitik Dhansukh, PA, 20 mEq at 07/12/24 0821 .  pravastatin (PRAVACHOL) tablet 80 mg, 80 mg, Oral, Nightly, Panwala, Naitik Dhansukh, PA, 80 mg at 07/11/24 2014 .  QUEtiapine  (SEROQUEL ) tablet 100 mg, 100 mg, Oral, Nightly, Panwala, Naitik Dhansukh, PA, 100 mg at 07/11/24 2014 .  QUEtiapine  (SEROQUEL ) tablet 100 mg, 100 mg, Oral, Nightly PRN, Verdie Darin Shelvy Lonni, MD .  sodium chloride  (NS) 0.9 % infusion, 125 mL/hr, Intravenous, Continuous, Panwala, Naitik Dhansukh, [place], Last Rate: 125 mL/hr at 07/12/24 0328, 125 mL/hr at 07/12/24 0328 .  topiramate  (Topamax ) tablet 200 mg, 200 mg, Oral, BID, Panwala, Naitik Dhansukh, PA, 200 mg at 07/12/24 9177 .  [START ON 07/13/2024] vancomycin  (VANCOCIN ) 1000 mg in sodium chloride  (NS) 0.9 % 200 mL IVPB (premix), 1,000 mg, Intravenous, Q18H, Panwala, Joana Chamber, PA

## 2024-07-13 ENCOUNTER — Telehealth: Payer: Self-pay

## 2024-07-13 ENCOUNTER — Ambulatory Visit: Admitting: Neurology

## 2024-07-13 NOTE — Telephone Encounter (Signed)
 Return call to pt. Pt wanted to rescheduled upcoming appointment. Pt rescheduled and made aware to get KUB prior to appointment. Pt voiced understanding.

## 2024-07-15 NOTE — Progress Notes (Signed)
 Abstraction Result Flowsheet Data  This patient's last AWV date: : Not Found This patients last WCC/CPE date: : Not Found   Reason for Encounter Reason for Encounter: Outreach Primary Reason for Outreach: AWV Text Message: No MyChart Message: No Outreach Call Outcome: Scheduled Telephone

## 2024-07-17 ENCOUNTER — Other Ambulatory Visit: Payer: Self-pay | Admitting: Neurology

## 2024-07-19 NOTE — Telephone Encounter (Signed)
 Please lets contact patient and verify if she is taking the Vimpat . Thanks

## 2024-07-26 ENCOUNTER — Ambulatory Visit: Admitting: Urology

## 2024-07-27 ENCOUNTER — Other Ambulatory Visit: Payer: Self-pay | Admitting: Internal Medicine

## 2024-07-27 NOTE — Telephone Encounter (Signed)
 Copied from CRM #[id number]. Topic: Clinical - Medication Refill >> Jul 27, 2024 10:57 AM Rilla B wrote: Medication: TRELEGY ELLIPTA  200-62.5-25 MCG/ACT AEPB VENTOLIN  HFA 108 (90 Base) MCG/ACT inhaler  Has the patient contacted their pharmacy? No (Agent: If no, request that the patient contact the pharmacy for the refill. If patient does not wish to contact the pharmacy document the reason why and proceed with request.) (Agent: If yes, when and what did the pharmacy advise?)  This is the patient's preferred pharmacy:   [id number] - [place], [place] - [address] [place] [address] Phone: [phone] Fax: [phone]  Is this the correct pharmacy for this prescription? Yes If no, delete pharmacy and type the correct one.   Has the prescription been filled recently? Yes  Is the patient out of the medication? Yes  Has the patient been seen for an appointment in the last year OR does the patient have an upcoming appointment? Yes  Can we respond through MyChart? Yes  Agent: Please be advised that Rx refills may take up to 3 business days. We ask that you follow-up with your pharmacy.

## 2024-07-29 ENCOUNTER — Ambulatory Visit: Admitting: Urology

## 2024-08-03 MED ORDER — ALBUTEROL SULFATE HFA 108 (90 BASE) MCG/ACT IN AERS: 2.0000 | INHALATION_SPRAY | Freq: Four times a day (QID) | RESPIRATORY_TRACT | 6 refills | Status: AC | PRN

## 2024-08-03 MED ORDER — TRELEGY ELLIPTA 200-62.5-25 MCG/ACT IN AEPB: 1.0000 | INHALATION_SPRAY | Freq: Every day | RESPIRATORY_TRACT | 4 refills | Status: AC

## 2024-08-19 ENCOUNTER — Ambulatory Visit: Admitting: Allergy & Immunology

## 2024-08-30 ENCOUNTER — Ambulatory Visit (HOSPITAL_COMMUNITY)
Admission: RE | Admit: 2024-08-30 | Discharge: 2024-08-30 | Disposition: A | Discharge status: Home / Self Care | Source: Ambulatory Visit | Attending: Radiology | Admitting: Radiology

## 2024-08-30 DIAGNOSIS — C3412 Malignant neoplasm of upper lobe, left bronchus or lung: Secondary | ICD-10-CM | POA: Diagnosis present

## 2024-09-01 ENCOUNTER — Encounter: Payer: Self-pay | Admitting: Radiation Oncology

## 2024-09-01 ENCOUNTER — Ambulatory Visit
Admission: RE | Admit: 2024-09-01 | Discharge: 2024-09-01 | Disposition: A | Discharge status: Home / Self Care | Source: Ambulatory Visit | Attending: Radiation Oncology | Admitting: Radiation Oncology

## 2024-09-01 ENCOUNTER — Telehealth: Payer: Self-pay | Admitting: Radiation Oncology

## 2024-09-01 DIAGNOSIS — C3412 Malignant neoplasm of upper lobe, left bronchus or lung: Secondary | ICD-10-CM

## 2024-09-01 NOTE — Telephone Encounter (Signed)
 1/22 Patient called to be r/s from Monday 1/26 to today, nursing is aware.  Will follow up call to patient.

## 2024-09-01 NOTE — Progress Notes (Signed)
 [name] has telephone  for follow up post radiation to the lung. Identity verified.   Lung Side: left lung, patient completed treatment on 04/26/24.   Does the patient complain of any of the following: Pain: No Shortness of breath w/wo exertion:  No  Cough: Yes, dry cough.  Hemoptysis: No Pain with swallowing: No Swallowing/choking concerns: Yes. Choking on food.  Appetite: fair. Patient drinking 1-2 boost daily.  Energy Level: Low  Post radiation skin Changes: No     Additional comments if applicable:

## 2024-09-01 NOTE — Progress Notes (Signed)
 "  Radiation Oncology         [phone] ________________________________  Name: [name] MRN: [id number]  Date: 09/01/2024  DOB: 07/31/1956  Outpatient Follow-Up Visit - Conducted via telephone at patient request.   I spoke with the patient to conduct this consult visit via telephone. The patient was notified in advance and was offered an in person or telemedicine meeting to allow for face to face communication but instead preferred to proceed with a telephone    CC: Veronica Lynwood NOVAK, MD  Veronica Lamar RAMAN, MD    ICD-10-CM   1. 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 She did endorse shortness of breath and a cough as well as dysphagia/odynophagia throughout her radiation treatment course. She otherwise tolerated radiation therapy relatively well and denied any other side effects from treatment.  In the interval since, she presented to the ED on 07/11/2024 for shortness of breath and AMS. She was admitted for sepsis due to pneumonia and ultimately discharged on 07/12/2024.   Patient reports to be doing well overall today. She notes a dry cough, but denies any shortness of breath with or without exertion. She denies any chest pain or hemoptysis.                    Allergies:  is allergic to amoxicillin, azithromycin, cefdinir , chantix  [varenicline ], clindamycin , doxycycline   hyclate, levofloxacin , paxlovid  [nirmatrelvir -ritonavir ], penicillins, sulfonamide derivatives, toradol  [ketorolac  tromethamine ], clarithromycin, nortriptyline, moxifloxacin , codeine , fluticasone -salmeterol, and triptans.  Meds: Current Outpatient Medications  Medication Sig Dispense Refill   acetaminophen  (TYLENOL ) 500 MG tablet Take 1,000 mg by mouth every 6 (six) hours as needed for mild pain (pain score 1-3), moderate pain (pain score 4-6) or headache.     albuterol  (PROVENTIL ) (2.5 MG/3ML) 0.083% nebulizer solution USE 1 VIAL IN NEBULIZER EVERY 6 HOURS AS NEEDED FOR SHORTNESS OF BREATH 75 mL 8   albuterol  (VENTOLIN  HFA) 108 (90 Base) MCG/ACT inhaler Inhale 2 puffs into the lungs every 6 (six) hours as needed for wheezing or shortness of breath. 18 g 6   Cholecalciferol  (VITAMIN D3) 50 MCG (2000 UT) capsule Take 1 capsule (2,000 Units total) by mouth daily.     Dextromethorphan -guaiFENesin  (MUCINEX  DM MAXIMUM STRENGTH) 60-1200 MG TB12 Take 1 tablet by mouth 2 (two) times daily. 20 tablet 0   famotidine  (PEPCID ) 40 MG tablet Take 40 mg by mouth at bedtime.     ferrous sulfate  325 (65 FE) MG tablet Take 325 mg by mouth daily with breakfast.     fluticasone  (FLONASE ) 50 MCG/ACT nasal spray Place 1 spray into both nostrils 2 (two) times daily.     Fluticasone -Umeclidin-Vilant (TRELEGY ELLIPTA ) 200-62.5-25 MCG/ACT AEPB Inhale 1 puff into the lungs daily. 60 each 4   gabapentin  (NEURONTIN ) 100 MG capsule Take 2 capsules (200 mg total) by mouth 3 (three) times daily.     guaiFENesin  (MUCINEX ) 600 MG 12 hr tablet  Take 2 tablets (1,200 mg total) by mouth 2 (two) times daily as needed for cough or to loosen phlegm.     ipratropium-albuterol  (DUONEB) 0.5-2.5 (3) MG/3ML SOLN Take 3 mLs by nebulization every 6 (six) hours as needed (Wheezing/sob).     lacosamide  (VIMPAT ) 50 MG TABS tablet TAKE 1 TABLET BY MOUTH TWICE A DAY 60 tablet 5   loratadine  (ALLERGY RELIEF) 10 MG tablet Take 1 tablet (10 mg total) by  mouth daily. 90 tablet 3   LORazepam  (ATIVAN ) 0.5 MG tablet Take 1 tablet (0.5 mg total) by mouth every 8 (eight) hours as needed for anxiety. 14 tablet 0   methocarbamol (ROBAXIN) 750 MG tablet Take 750 mg by mouth at bedtime.     metoCLOPramide  (REGLAN ) 5 MG tablet Take 1 tablet (5 mg total) by mouth every 12 (twelve) hours as needed for up to 2 doses for nausea. Take 30-45 minutes before evening and AM doses of bowel preparation solution. 2 tablet 0   montelukast  (SINGULAIR ) 10 MG tablet TAKE ONE TABLET BY MOUTH AT BEDTIME 30 tablet 5   OXYGEN  Inhale 3 L into the lungs continuous. Via [place] PRN and at bedtime     pantoprazole  (PROTONIX ) 40 MG tablet Take 1 tablet (40 mg total) by mouth 2 (two) times daily. **PLEASE CALL OFFICE TO SCHEDULE FOLLOW UP 60 tablet 0   QUEtiapine  (SEROQUEL ) 100 MG tablet Take 1 tablet (100 mg total) by mouth at bedtime. 30 tablet 5   simvastatin  (ZOCOR ) 40 MG tablet TAKE ONE TABLET BY MOUTH AT BEDTIME 90 tablet 2   topiramate  (TOPAMAX ) 100 MG tablet Take 2 tablets (200 mg total) by mouth 2 (two) times daily. 360 tablet 2   triamcinolone  cream (KENALOG ) 0.1 % Apply 1 Application topically daily as needed (irritation).     No current facility-administered medications for this encounter.    Physical Findings: The patient is in no acute distress.  PE limited due to nature of telephone visit.    Lab Findings: Lab Results  Component Value Date   WBC 12.2 (H) 04/26/2024   HGB 12.4 04/26/2024   HCT 38.9 04/26/2024   MCV 98.2 04/26/2024   PLT 215 04/26/2024    Radiographic Findings: CT CHEST WO CONTRAST Result Date: 09/01/2024 EXAM: CT CHEST WITHOUT CONTRAST 08/30/2024 01:37:37 PM TECHNIQUE: CT of the chest was performed without the administration of intravenous contrast. Multiplanar reformatted images are provided for review. Automated exposure control, iterative reconstruction, and/or weight based adjustment of the mA/kV was utilized to reduce the radiation dose  to as low as reasonably achievable. COMPARISON: CT 02/25/2024. CLINICAL HISTORY: Non-small cell lung cancer (NSCLC), monitor. * Tracking Code: BO * FINDINGS: MEDIASTINUM: Heart and pericardium are unremarkable. The central airways are clear. LYMPH NODES: No mediastinal, hilar or axillary lymphadenopathy. LUNGS AND PLEURA: Left upper lobe nodule, previously hypermetabolic on comparison PET scan, is decreased in size measuring 15 x 10 mm compared to 19 x 16 mm. There is a small central cavitation within the lesion. No new pulmonary nodules. Central opacities in the upper lobes. Nodular thickening in the posterior right pleural space on image 74 series 4 is more prominent. Recommend attentional follow up. No pleural effusion or pneumothorax. SOFT TISSUES/BONES: No acute abnormality of the bones or soft tissues. No skeletal metastases. UPPER ABDOMEN: Limited images of the upper abdomen demonstrate normal adrenal glands. No other acute abnormality. IMPRESSION: 1. Decreased size of the left upper lobe nodule, now measuring 15 x 10 mm with small central cavitation,  previously 19 x 16 mm. No new pulmonary nodules. 2. More prominent nodular thickening in the posterior right pleural space. Recommend attention on follow-up. 3. No mediastinal adenopathy or skeletal metastases. No evidence of metastatic disease. Electronically signed by: Norleen Boxer MD 09/01/2024 09:29 AM EST RP Workstation: [id number]    Impression/Plan: Stage IA2 (cT1b, N0, M0) Squamous cell carcinoma, NSCLC, of the left upper lung; s/p SBRT completed on 04/26/2024  The patient tolerated her treatment and has healed well. We personally reviewed the results of patient's most recent CT which shows a decrease in size of the treated LUL. More prominent nodular thickening in the posterior right pleural space was noted. Dr. Shannon recommends close follow-up with a CT of the chest in 3 months. Patient expressed understanding and is in agreement with this plan.    CT of the chest ordered to be completed in 3 months. We see the patient back to review the results. She was encouraged to call with any questions or concerns in the meantime.       Leeroy Due, PA-C    This encounter was conducted via telephone.  The patient has provided two factor identification and has given verbal consent for this type of encounter and has been advised to only accept a meeting of this type in a secure network environment.  The time spent during this encounter was 20 minutes including preparation, discussion, and coordination of the patient's care  The attendants for this meeting include Leeroy Due PA-C and patient. During the encounter Leeroy Due PA-C was located at [facility] Radiation Oncology Department.  Patient was located at home.  ____________________________________   Leeroy Due, PA-C     "

## 2024-09-05 ENCOUNTER — Ambulatory Visit: Admitting: Radiation Oncology

## 2024-09-19 ENCOUNTER — Ambulatory Visit: Admitting: Family Medicine

## 2024-09-26 ENCOUNTER — Ambulatory Visit: Admitting: Urology

## 2024-11-28 ENCOUNTER — Ambulatory Visit: Admitting: Internal Medicine

## 2024-11-30 ENCOUNTER — Ambulatory Visit (HOSPITAL_COMMUNITY)

## 2024-12-05 ENCOUNTER — Ambulatory Visit: Admitting: Neurology

## 2024-12-06 ENCOUNTER — Ambulatory Visit: Admitting: Radiology

## 2025-01-23 ENCOUNTER — Ambulatory Visit: Admitting: Cardiovascular Disease
